# Patient Record
Sex: Male | Born: 1935
Health system: Southern US, Community
[De-identification: ages and names within clinical notes are randomized; demographics above are authoritative.]

## PROBLEM LIST (undated history)

## (undated) DIAGNOSIS — D649 Anemia, unspecified: Secondary | ICD-10-CM

## (undated) DIAGNOSIS — I2510 Atherosclerotic heart disease of native coronary artery without angina pectoris: Secondary | ICD-10-CM

## (undated) DIAGNOSIS — R05 Cough: Secondary | ICD-10-CM

## (undated) DIAGNOSIS — R590 Localized enlarged lymph nodes: Secondary | ICD-10-CM

## (undated) DIAGNOSIS — I219 Acute myocardial infarction, unspecified: Secondary | ICD-10-CM

## (undated) DIAGNOSIS — R0600 Dyspnea, unspecified: Secondary | ICD-10-CM

## (undated) DIAGNOSIS — I10 Essential (primary) hypertension: Secondary | ICD-10-CM

## (undated) DIAGNOSIS — I209 Angina pectoris, unspecified: Secondary | ICD-10-CM

## (undated) DIAGNOSIS — I499 Cardiac arrhythmia, unspecified: Secondary | ICD-10-CM

## (undated) DIAGNOSIS — F419 Anxiety disorder, unspecified: Secondary | ICD-10-CM

## (undated) DIAGNOSIS — Z87442 Personal history of urinary calculi: Secondary | ICD-10-CM

## (undated) DIAGNOSIS — D469 Myelodysplastic syndrome, unspecified: Secondary | ICD-10-CM

## (undated) DIAGNOSIS — R059 Cough, unspecified: Secondary | ICD-10-CM

## (undated) DIAGNOSIS — I4891 Unspecified atrial fibrillation: Secondary | ICD-10-CM

## (undated) DIAGNOSIS — I680 Cerebral amyloid angiopathy: Secondary | ICD-10-CM

## (undated) DIAGNOSIS — K219 Gastro-esophageal reflux disease without esophagitis: Secondary | ICD-10-CM

## (undated) DIAGNOSIS — N189 Chronic kidney disease, unspecified: Secondary | ICD-10-CM

## (undated) DIAGNOSIS — E854 Organ-limited amyloidosis: Secondary | ICD-10-CM

## (undated) DIAGNOSIS — I509 Heart failure, unspecified: Secondary | ICD-10-CM

## (undated) DIAGNOSIS — R062 Wheezing: Secondary | ICD-10-CM

## (undated) DIAGNOSIS — I429 Cardiomyopathy, unspecified: Secondary | ICD-10-CM

## (undated) HISTORY — DX: Anxiety disorder, unspecified: F41.9

## (undated) HISTORY — DX: Chronic kidney disease, unspecified: N18.9

## (undated) HISTORY — PX: CORONARY ANGIOPLASTY WITH STENT PLACEMENT: SHX49

## (undated) HISTORY — DX: Anemia, unspecified: D64.9

## (undated) HISTORY — PX: COLON SURGERY: SHX602

## (undated) HISTORY — PX: CHOLECYSTECTOMY: SHX55

## (undated) HISTORY — PX: CORONARY ARTERY BYPASS GRAFT: SHX141

---

## 2003-07-07 ENCOUNTER — Other Ambulatory Visit: Payer: Self-pay

## 2003-07-10 ENCOUNTER — Other Ambulatory Visit: Payer: Self-pay

## 2005-02-20 ENCOUNTER — Ambulatory Visit: Payer: Self-pay | Admitting: Gastroenterology

## 2005-12-07 ENCOUNTER — Ambulatory Visit: Payer: Self-pay | Admitting: Podiatry

## 2006-07-02 ENCOUNTER — Ambulatory Visit: Payer: Self-pay | Admitting: Internal Medicine

## 2007-04-30 ENCOUNTER — Ambulatory Visit: Payer: Self-pay | Admitting: Ophthalmology

## 2008-03-23 ENCOUNTER — Ambulatory Visit: Payer: Self-pay | Admitting: Gastroenterology

## 2009-08-01 ENCOUNTER — Ambulatory Visit: Payer: Self-pay | Admitting: Internal Medicine

## 2009-08-26 ENCOUNTER — Ambulatory Visit: Payer: Self-pay | Admitting: Gastroenterology

## 2010-09-10 ENCOUNTER — Inpatient Hospital Stay (HOSPITAL_COMMUNITY)
Admission: AD | Admit: 2010-09-10 | Discharge: 2010-09-12 | DRG: 694 | Disposition: A | Discharge status: Home / Self Care | Payer: Medicare Other | Source: Other Acute Inpatient Hospital | Attending: Emergency Medicine | Admitting: Emergency Medicine

## 2010-09-10 ENCOUNTER — Emergency Department: Payer: Self-pay | Admitting: Emergency Medicine

## 2010-09-10 ENCOUNTER — Inpatient Hospital Stay (HOSPITAL_COMMUNITY): Payer: Medicare Other

## 2010-09-10 DIAGNOSIS — Z7982 Long term (current) use of aspirin: Secondary | ICD-10-CM

## 2010-09-10 DIAGNOSIS — F17200 Nicotine dependence, unspecified, uncomplicated: Secondary | ICD-10-CM | POA: Diagnosis present

## 2010-09-10 DIAGNOSIS — R079 Chest pain, unspecified: Secondary | ICD-10-CM | POA: Diagnosis present

## 2010-09-10 DIAGNOSIS — I129 Hypertensive chronic kidney disease with stage 1 through stage 4 chronic kidney disease, or unspecified chronic kidney disease: Secondary | ICD-10-CM | POA: Diagnosis present

## 2010-09-10 DIAGNOSIS — R7989 Other specified abnormal findings of blood chemistry: Secondary | ICD-10-CM

## 2010-09-10 DIAGNOSIS — N183 Chronic kidney disease, stage 3 unspecified: Secondary | ICD-10-CM | POA: Diagnosis present

## 2010-09-10 DIAGNOSIS — I2510 Atherosclerotic heart disease of native coronary artery without angina pectoris: Secondary | ICD-10-CM | POA: Diagnosis present

## 2010-09-10 DIAGNOSIS — D126 Benign neoplasm of colon, unspecified: Secondary | ICD-10-CM | POA: Diagnosis present

## 2010-09-10 DIAGNOSIS — Z860100 Personal history of colon polyps, unspecified: Secondary | ICD-10-CM

## 2010-09-10 DIAGNOSIS — I252 Old myocardial infarction: Secondary | ICD-10-CM

## 2010-09-10 DIAGNOSIS — N201 Calculus of ureter: Principal | ICD-10-CM | POA: Diagnosis present

## 2010-09-10 DIAGNOSIS — E785 Hyperlipidemia, unspecified: Secondary | ICD-10-CM | POA: Diagnosis present

## 2010-09-10 DIAGNOSIS — K219 Gastro-esophageal reflux disease without esophagitis: Secondary | ICD-10-CM | POA: Diagnosis present

## 2010-09-10 DIAGNOSIS — N1330 Unspecified hydronephrosis: Secondary | ICD-10-CM | POA: Diagnosis present

## 2010-09-10 DIAGNOSIS — Z951 Presence of aortocoronary bypass graft: Secondary | ICD-10-CM

## 2010-09-10 DIAGNOSIS — Z9861 Coronary angioplasty status: Secondary | ICD-10-CM

## 2010-09-10 LAB — CBC
HCT: 36.2 % — ABNORMAL LOW (ref 39.0–52.0)
Hemoglobin: 12.1 g/dL — ABNORMAL LOW (ref 13.0–17.0)
MCHC: 33.4 g/dL (ref 30.0–36.0)
RBC: 3.97 MIL/uL — ABNORMAL LOW (ref 4.22–5.81)

## 2010-09-11 ENCOUNTER — Inpatient Hospital Stay (HOSPITAL_COMMUNITY): Payer: Medicare Other

## 2010-09-11 DIAGNOSIS — I2510 Atherosclerotic heart disease of native coronary artery without angina pectoris: Secondary | ICD-10-CM

## 2010-09-11 LAB — COMPREHENSIVE METABOLIC PANEL
ALT: 12 U/L (ref 0–53)
AST: 20 U/L (ref 0–37)
AST: 18 U/L (ref 0–37)
Albumin: 3.6 g/dL (ref 3.5–5.2)
Albumin: 3.3 g/dL — ABNORMAL LOW (ref 3.5–5.2)
BUN: 14 mg/dL (ref 6–23)
CO2: 24 mEq/L (ref 19–32)
Calcium: 8 mg/dL — ABNORMAL LOW (ref 8.4–10.5)
Calcium: 8.1 mg/dL — ABNORMAL LOW (ref 8.4–10.5)
Chloride: 102 mEq/L (ref 96–112)
Creatinine, Ser: 1.57 mg/dL — ABNORMAL HIGH (ref 0.4–1.5)
Creatinine, Ser: 1.55 mg/dL — ABNORMAL HIGH (ref 0.4–1.5)
GFR calc Af Amer: 52 mL/min — ABNORMAL LOW (ref >60)
GFR calc Af Amer: 53 mL/min — ABNORMAL LOW (ref >60)
Sodium: 136 mEq/L (ref 135–145)
Total Protein: 6.5 g/dL (ref 6.0–8.3)

## 2010-09-11 LAB — TSH: TSH: 2.044 u[IU]/mL (ref 0.350–4.500)

## 2010-09-11 LAB — CBC
HCT: 34.9 % — ABNORMAL LOW (ref 39.0–52.0)
Hemoglobin: 11.7 g/dL — ABNORMAL LOW (ref 13.0–17.0)
MCHC: 33.5 g/dL (ref 30.0–36.0)
MCV: 91.4 fL (ref 78.0–100.0)
RDW: 14 % (ref 11.5–15.5)
WBC: 10.9 10*3/uL — ABNORMAL HIGH (ref 4.0–10.5)

## 2010-09-11 LAB — CARDIAC PANEL(CRET KIN+CKTOT+MB+TROPI)
CK, MB: 2.2 ng/mL (ref 0.3–4.0)
Relative Index: 1.9 (ref 0.0–2.5)
Relative Index: 1.6 (ref 0.0–2.5)
Relative Index: 1.3 (ref 0.0–2.5)
Total CK: 113 U/L (ref 7–232)
Total CK: 116 U/L (ref 7–232)
Troponin I: 0.03 ng/mL (ref 0.00–0.06)
Troponin I: 0.02 ng/mL (ref 0.00–0.06)
Troponin I: 0.02 ng/mL (ref 0.00–0.06)

## 2010-09-11 LAB — URINALYSIS, ROUTINE W REFLEX MICROSCOPIC
Bilirubin Urine: NEGATIVE
Ketones, ur: NEGATIVE mg/dL
Leukocytes, UA: NEGATIVE
Nitrite: NEGATIVE
Protein, ur: 30 mg/dL — AB

## 2010-09-11 LAB — DIFFERENTIAL
Basophils Absolute: 0 10*3/uL (ref 0.0–0.1)
Eosinophils Relative: 1 % (ref 0–5)
Lymphocytes Relative: 20 % (ref 12–46)
Lymphs Abs: 2.2 10*3/uL (ref 0.7–4.0)
Monocytes Absolute: 2.1 10*3/uL — ABNORMAL HIGH (ref 0.1–1.0)
Neutro Abs: 6.6 10*3/uL (ref 1.7–7.7)

## 2010-09-11 LAB — APTT: aPTT: 33 seconds (ref 24–37)

## 2010-09-11 LAB — LIPID PANEL
HDL: 15 mg/dL — ABNORMAL LOW (ref >39)
Total CHOL/HDL Ratio: 5.6 RATIO
Triglycerides: 78 mg/dL (ref <150)
VLDL: 16 mg/dL (ref 0–40)

## 2010-09-11 LAB — PHOSPHORUS: Phosphorus: 3.3 mg/dL (ref 2.3–4.6)

## 2010-09-11 LAB — URINE MICROSCOPIC-ADD ON

## 2010-09-11 LAB — PROTIME-INR
INR: 1.05 (ref 0.00–1.49)
Prothrombin Time: 13.9 seconds (ref 11.6–15.2)

## 2010-09-11 LAB — D-DIMER, QUANTITATIVE: D-Dimer, Quant: 1.46 ug/mL-FEU — ABNORMAL HIGH (ref 0.00–0.48)

## 2010-09-11 MED ORDER — TECHNETIUM TO 99M ALBUMIN AGGREGATED
6.0000 | Freq: Once | INTRAVENOUS | Status: AC | PRN
  Administered 2010-09-11: 6 via INTRAVENOUS

## 2010-09-11 MED ORDER — XENON XE 133 GAS
10.0000 | GAS_FOR_INHALATION | Freq: Once | RESPIRATORY_TRACT | Status: AC | PRN
  Administered 2010-09-11: 10 via RESPIRATORY_TRACT

## 2010-09-12 LAB — COMPREHENSIVE METABOLIC PANEL
Albumin: 3.3 g/dL — ABNORMAL LOW (ref 3.5–5.2)
Alkaline Phosphatase: 67 U/L (ref 39–117)
BUN: 15 mg/dL (ref 6–23)
CO2: 25 mEq/L (ref 19–32)
Chloride: 105 mEq/L (ref 96–112)
Creatinine, Ser: 1.31 mg/dL (ref 0.4–1.5)
GFR calc non Af Amer: 53 mL/min — ABNORMAL LOW (ref >60)
Glucose, Bld: 103 mg/dL — ABNORMAL HIGH (ref 70–99)
Potassium: 3.5 mEq/L (ref 3.5–5.1)
Total Bilirubin: 1.1 mg/dL (ref 0.3–1.2)

## 2010-09-12 LAB — CBC
HCT: 36.3 % — ABNORMAL LOW (ref 39.0–52.0)
Hemoglobin: 11.8 g/dL — ABNORMAL LOW (ref 13.0–17.0)
WBC: 6.8 10*3/uL (ref 4.0–10.5)

## 2010-09-12 LAB — DIFFERENTIAL
Basophils Absolute: 0.1 K/uL (ref 0.0–0.1)
Basophils Relative: 1 % (ref 0–1)
Eosinophils Absolute: 0.2 K/uL (ref 0.0–0.7)
Eosinophils Relative: 2 % (ref 0–5)
Lymphocytes Relative: 30 % (ref 12–46)
Lymphs Abs: 2.1 K/uL (ref 0.7–4.0)
Monocytes Absolute: 1.6 K/uL — ABNORMAL HIGH (ref 0.1–1.0)
Monocytes Relative: 24 % — ABNORMAL HIGH (ref 3–12)
Neutro Abs: 2.9 K/uL (ref 1.7–7.7)
Neutrophils Relative %: 43 % (ref 43–77)

## 2010-09-13 ENCOUNTER — Observation Stay: Payer: Self-pay | Admitting: Cardiology

## 2010-09-13 NOTE — H&P (Signed)
Andres Gutierrez, Andres NO.:  [id number]  MEDICAL RECORD NO.:  [id number]           PATIENT TYPE:  I  LOCATION:  2021                         FACILITY:  [facility]  PHYSICIAN:  Irwin Brakeman, MD   DATE OF BIRTH:  [birth year]  DATE OF ADMISSION:  09/10/2010 DATE OF DISCHARGE:                             HISTORY & PHYSICAL   PRIMARY CARE PHYSICIAN:  Dr. Emily Filbert.  CARDIOLOGIST:  Dr. Ubaldo Glassing.  CHIEF COMPLAINT:  Abdominal pain.  HISTORY OF PRESENT ILLNESS:  This patient is a 77 year old gentleman with coronary artery disease and dyslipidemia who presented to [facility] Emergency Department earlier today complaining of abdominal pain for approximately 12 hours.  The patient reports that the abdominal pain started at around midnight and persisted for approximately 6 hours and then dissipated and then a couple of hours after that the pain started again, it woke him from sleep.  The patient describes the pain in the right upper quadrant area with pain in the epigastric area and center of the chest.  The patient reports that he went to the [facility] Emergency Department.  At the ER, he had a CT scan that revealed an obstructing distal right ureteral stone with periureteric and perinephric stranding.  There was mild right hydronephrosis.  Bilateral renal cysts were also present.  In addition to that, the patient's cardiac enzymes were mildly elevated.  His troponins were elevated.  The patient has a history of coronary artery disease and bypass.  He also has cardiac stents.  He was also told that 2 years ago when he had a catheterization that there was some coronary artery disease that was present that was not amenable to intervention. The patient denies shortness of breath.  The patient was transferred to [facility] so that he can receive Urology Care  and Cardiology consultation.  Hospital admission was requested.  PAST MEDICAL HISTORY: 1. The  patient reports that he had a myocardial infarction in 1984,     and had triple vessel bypass in December 1984.  The patient reports     that he has one cardiac stent. 2. Hypertension. 3. Hyperlipidemia. 4. History of multiple colon polyps and family history of colon     cancer.  PAST SURGICAL HISTORY:  The patient reports a three-vessel CABG in 1984 and cardiac stents placed and appendectomy.  MEDICATIONS: 1. Aspirin 81 mg one p.o. daily. 2. Fish oil 1000 mg one p.o. daily. 3. Lipitor 40 mg p.o. daily. 4. Metoprolol succinate 100 mg extended release one p.o. b.i.d. 5. Micardis 80 mg one p.o. daily. 6. Omeprazole 40 mg one p.o. daily.  ALLERGIES:  Benadryl, Maxzide, Vytorin, and Zocor.  FAMILY HISTORY:  The patient reports that his father died from colon cancer in 18.  He reports that his mother suffered from a myocardial infarction and also had breast cancer.  SOCIAL HISTORY:  This patient is married and lives at [facility]. He reports that he smoked cigarettes for approximately 30 years and then he started chewing tobacco.  He denies alcohol and recreational drug use.  REVIEW OF SYSTEMS:  The patient reports symptoms positive for nausea and some emesis this morning, no shortness of breath, positive for abdominal pain as mentioned in the HPI.  Please see HPI, otherwise all systems reviewed completely and reported as negative.  PHYSICAL EXAMINATION:  CURRENT VITAL SIGNS: Temperature afebrile, pulse 51, respirations 18, blood pressure 133/64, pulse ox 96% on room air. GENERAL:  This is an elderly male.  He is awake, alert in no apparent distress, cooperative and pleasant.  HEENT: Normocephalic, atraumatic. Mucous membranes dry.  Oropharynx pink. NECK:  Supple.  Thyroid soft.  No nodules or masses palpated.  No JVD, trachea midline. LUNGS:  Bilateral breath sounds clear to auscultation. CARDIAC:  Normal S1 and S2 sounds without murmurs, rubs or gallops. ABDOMEN:  Soft  with right upper quadrant tenderness.  Negative Murphy's sign.  No guarding or rebound tenderness noted.  No periumbilical tenderness.  Mild epigastric tenderness with deep palpation.  No masses palpated. EXTREMITIES:  No pretibial edema, cyanosis or clubbing.  Pedal pulses 2+ bilateral neurological exam.  No focal deficits.  Moving all extremities. PSYCHIATRIC:  Awake, alert and oriented x3. SKIN:  No skin breakdown or ulcers noted.  No other lesions noted.  LABORATORY DATA:  These labs are taken from [facility] records glucose 116, BUN 14, creatinine 1.24.  Sodium 137, potassium 3.7, chloride 103, bicarb 26, calcium 8.3, bilirubin 0.8, ALT 19, AST 21, total protein 7.8, albumin 3.9, troponin 0.12.  Urinalysis reveals 3+ blood, 50 red blood cells per high-power field, 2 white blood cells per high-power field.  Abdominal ultrasound reveals a thickened gallbladder wall but no evidence of cholelithiasis and negative Murphy sign and no ascites seen.  The CT of the abdomen and pelvis reveals the obstructing distal right ureteral stone with periureteric and perinephric stranding and mild right hydronephrosis.  IMPRESSION: 1. This is a 77 year old gentleman with coronary artery disease     presenting with an acute obstructing distal right ureteral stone     with periureteric and perinephric stranding and mild right     hydronephrosis. 2. Elevated troponin concerning for in-STEMI. 3. Hypertension. 4. Hyperlipidemia. 5. Colon polyps. 6. Chronic active nicotine dependence.  RECOMMENDATIONS: 1. The patient has been admitted to the telemetry unit for close     monitoring.  We will start anticoagulating the patient with a     heparin drip. 2. IV fluids have been ordered. 3. Morphine, oxygen, nitroglycerin ordered via the ACS protocol. 4. Resume home medications for blood pressure lipids and cholesterol. 5. We will ask a urologist to evaluate the patient as soon as  possible     and will also ask Cardiology to evaluate the patient in the     hospital as well. 6. Aspirin 325 mg p.o. 7. Fasting lipid profile ordered for a.m. 8. Cycle cardiac enzymes. 9. Check a BNP, TSH and D-dimer. 10.Continue to monitor the patient closely and make adjustments to     medical care as required.     Irwin Brakeman, MD     CJ/MEDQ  D:  09/10/2010  T:  09/10/2010  Job:  [id number]  Electronically Signed by Irwin Brakeman  on 09/13/2010 05:55:53 PM

## 2010-09-14 NOTE — Discharge Summary (Signed)
Andres Gutierrez, Andres Gutierrez                  ACCOUNT NO.:  [id number]  MEDICAL RECORD NO.:  [id number]           PATIENT TYPE:  I  LOCATION:  2021                         FACILITY:  [facility]  PHYSICIAN:  Toluwani Yadav, DO         DATE OF BIRTH:  01/12/1931  DATE OF ADMISSION:  09/10/2010 DATE OF DISCHARGE:  09/12/2010                              DISCHARGE SUMMARY   ADMISSION DIAGNOSES: 1. Ureterolithiasis. 2. Mild hydronephrosis. 3. Elevated troponin. 4. Hypertension. 5. Hyperlipidemia. 6. Colon polyps. 7. Chronic active nicotine dependence.  HISTORY OF PRESENT ILLNESS:  Please see H and [place] COURSE:  The patient was admitted to telemetry unit.  He was closely monitored.  The patient was anticoagulated with a heparin drip. He was given IV fluids, morphine, oxygen, nitroglycerin as per the ACS protocol.  The patient resumed his home medications for blood pressure, lipids, and cholesterol.  Urology was consulted for the patient's ureterolithiasis.  It was felt by the time the urologist had seen him that the 2-mm stone may have passed.  In any case, the patient by that time was asymptomatic and it was believed that the pain would continue to resolve more, but that he could have some recurrent discomfort.  He should have an 80% chance of spontaneous passage of the stone, so conservative therapy was recommended and that he should follow up with a local urologist in [place] should conservative therapy not help.  The patient was also seen by Cardiology who evaluated the patient and felt that his chest pain was unlikely to be due to ischemia.  He felt that he would benefit from noninvasive risk stratification after his stone is treated and an outpatient his recommendation is that he follow up with Dr. Ubaldo Glassing, his cardiologist, in [place] upon discharge.  The patient today is feeling well.  He said no chest pain, no pain from his kidney stone, and he wants to go home.  An echocardiogram  was ordered and has not been read yet.  The patient does not wish to cypher that.  The cardiologist felt that the patient could simply follow up with Dr. Ubaldo Glassing in [place] and that is what we will do.  We will discharge the patient to home.  DISCHARGE INSTRUCTIONS:  Include activity as tolerated.  Diet is cardiac.  MEDICATIONS AT HOME: 1. Tamsulosin 0.4 mg 1 capsule p.o. daily. 2. Acetaminophen 325 two p.o. q.4 hours p.r.n. pain. 3. Aspirin 81 mg 1 p.o. daily. 4. Omega-3 acid ethyl esters 1 g p.o. daily. 5. Lipitor 40 mg 1 p.o. daily. 6. Metoprolol XL 100 mg 1 p.o. b.i.d. 7. Micardis 80 mg 1 p.o. daily. 8. Omeprazole 40 mg 1 p.o. daily.  Besides the cardiac diet, the patient is to drink plenty of water.  I have spent 40 minutes on this discharge.          ______________________________ Karie Kirks, DO     AS/MEDQ  D:  09/12/2010  T:  09/13/2010  Job:  [id number]  cc:   Dr. Norm Salt, MD  Electronically Signed by Karie Kirks DO  on 09/14/2010 06:07:39 PM

## 2010-09-20 NOTE — Consult Note (Signed)
Andres Gutierrez, Andres Gutierrez NO.:  [id number]  MEDICAL RECORD NO.:  [id number]           PATIENT TYPE:  I  LOCATION:  2021                         FACILITY:  [facility]  PHYSICIAN:  Bernestine Amass, M.D.  DATE OF BIRTH:  13-Feb-1931  DATE OF CONSULTATION: DATE OF DISCHARGE:                                CONSULTATION   REASON FOR CONSULTATION:  A 2-mm right distal ureteral stone.  HISTORY OF PRESENT ILLNESS:  Andres Gutierrez is 77 years of age.  He reports one prior history of nephrolithiasis without any previous urologic intervention.  The patient was transferred from [facility] Emergency Room last evening apparently because of some questionable cardiac issues and also abdominal pain.  CT scan that shown a very small 2-mm right distal ureteral stone and apparently her transfer was accepted because of lack of Urology consultation available at [facility].  A stone this size was not complicated by any problems with fever, chills, urosepsis, etc.  The patient was admitted and underwent some cardiac assessment.  He is now pain free.  He believes he may have passed the stone, but the stone was not actually obtained.  He has had no pain or vomiting.  No voiding issues and feels good at this time.  PAST MEDICAL HISTORY:  Notable for coronary artery disease, status post bypass grafting along with hypertension and hyperlipidemia.  MEDICATIONS:  The patient's admission medications included aspirin, Lipitor, metoprolol, omeprazole, Micardis and aspirin.  ALLERGIES:  The patient has allergies to [place], VYTORIN, AND ZOCOR.  SOCIAL HISTORY:  The patient has a previous 50-60 pack-year smoking history.  FAMILY HISTORY:  Noncontributory.  PHYSICAL EXAMINATION:  VITAL SIGNS:  He is hypertensive with a blood pressure 160/84.  Pulse is 62. GENERAL:  The patient is a well-developed, well-nourished male in no acute distress. NECK:  Exam shows no obvious  JVD. RESPIRATORY:  Shows normal respiratory effort. HEART:  Regular rate and rhythm. ABDOMEN:  Soft and nontender.  No CVA tenderness.  No palpable masses. EXTREMITIES:  Normal.  DATA:  CT by report shows a 2-mm distal right ureteral stone.  I was unable to actually view the images on the DVT on the [facility] computer.  ASSESSMENT:  A 2-mm right distal ureteral stone.  The stone may indeed have already passed and the patient is asymptomatic.  I did tell Andres Gutierrez that occasionally the pain will resolve, but the stone may be still present and it is possible that he will have some recurrent discomfort.  The stone of this size should have an 80% chance of spontaneous passage and generally we recommend conservative therapy unless the patient has unremitting pain or complicating features such as solitary kidney, urosepsis, etc..  I have suggested that he follow up with local urologist in his community, but that if he is unable to do so or prefer to follow up in [place] would be happy to see him.     Bernestine Amass, M.D.     DSG/MEDQ  D:  09/11/2010  T:  09/12/2010  Job:  [id number]  Electronically Signed by Mikaila Grunert  Risa Grill M.D. on 09/20/2010 08:45:04 AM

## 2010-10-20 NOTE — Consult Note (Signed)
NAMERIAZ, SHOSTAK NO.:  [id number]  MEDICAL RECORD NO.:  [id number]           PATIENT TYPE:  I  LOCATION:  2021                         FACILITY:  [facility]  PHYSICIAN:  Johnney Ou, MD DATE OF BIRTH:  August 11, 1930  DATE OF CONSULTATION:  09/10/2010 DATE OF DISCHARGE:                                CONSULTATION   CARDIOLOGIST:  Dr. Ubaldo Glassing.  PRIMARY DOCTOR:  Dr. Sabra Heck.  REASON FOR CONSULTATION:  Elevated cardiac biomarkers.  CHIEF COMPLAINT:  Abdominal pain.  HISTORY OF PRESENT ILLNESS:  This is a 77 year old white male with history of coronary disease status post coronary bypass graft and PCI who presents from [facility] after having reported abdominal pain and CT scan showing right hydronephrosis from a distal obstructing ureteral stone.  He reports mild epigastric pain that has lasted for over 1 day that begins in his epigastrium and radiates to his left groin.  He also reports new onset of right lower quadrant focal pain that is about 4/10.  He has had this pain over the past few months off and on but never lasting this long.  He is very active and walks approximately 3 miles a few times a week and does not report any recent exertional angina, rest pain, increased lower extremity edema, paroxysmal nocturnal dyspnea, orthopnea, palpitations, syncope, or presyncope.  His presenting symptoms are reproducible and wax and wane though over the past day or so have not completely resolved.  PAST MEDICAL HISTORY: 1. Coronary disease status post coronary bypass graft reportedly two-     vessel bypass in 1984 with a PCI to a graft in 1997 and overall     unknown anatomy. 2. Hypertension. 3. Hyperlipidemia. 4. GERD.  ALLERGIES:  BENADRYL, MAXZIDE, VYTORIN, and ZOCOR.  MEDICATIONS ON ADMISSION: 1. Aspirin 81 mg daily. 2. Fish oil 1000 mg daily. 3. Lipitor 40 mg daily. 4. Metoprolol XL 100 mg twice daily. 5. Micardis 80 mg  daily. 6. Omeprazole 40 mg daily.  SOCIAL HISTORY:  He lives in [place] with his wife.  He is currently retired.  He has a approximately 30 pack-year history and quit smoking in 1979.  FAMILY HISTORY:  His mother had a myocardial infarction at 62 years old.  REVIEW OF SYSTEMS:  All 14 systems were reviewed and were negative except as mentioned in detail in HPI.  PHYSICAL EXAMINATION:  VITAL SIGNS:  His blood pressure is 130/71, respiratory rate is 18, pulses 54, sating 94% on room air. GENERAL:  He is a 77 year old white male, appearing stated age, no acute distress. HEENT:  Moist mucous membranes.  Pupils equal, round, and reactive to light and accommodation.  Anicteric sclera. NECK:  No jugular venous distention.  No thyromegaly CARDIOVASCULAR:  Regular rate and rhythm.  No murmurs, rubs, or gallops. LUNGS:  Clear to auscultation bilaterally. ABDOMEN:  Mild epigastric tenderness to palpation with mild rebound. EXTREMITIES:  No clubbing, cyanosis, or edema. NEUROLOGIC:  Alert and oriented x3.  Cranial nerves II through XII grossly intact.  No focal neurologic deficit. SKIN:  Warm, dry, and intact.  No rashes.  PSYCHIATRIC:  Mood and affect are appropriate.  RADIOLOGY:  A CT showed distal right ureteral stone with perinephric stranding and mild bilateral hydronephrosis EKG showed normal sinus rhythm with a rate of 66 beats per minute with anterior Q-waves and PVCs.  LABORATORY REVIEW:  White cell count is 13.5, hematocrit is 36.2. Potassium is 3.7, creatinine is 1.24.  Troponin at the outside hospital is 0.12.  ASSESSMENT:  This is a 77 year old white male with a history of coronary disease status post coronary bypass graft here with obstructing ureteral stone and a mildly elevated troponin. 1. Elevated troponin.  This unlikely represents acute coronary     syndrome and at worst represents type 2 myocardial infarction.  I     agree with empiric heparin for now unless his  hematuria worsens.     Repeat troponin here and cycle cardiac enzymes.  He does not have     any chest pain and his EKG is nonischemic which suggests low-risk     presentation from a cardiac standpoint.  He will likely benefit     from noninvasive risk ratification after his stone is treated. 2. Hyperlipidemia.  We will continue statin therapy and check a     fasting lipid profile. 3. Hypertension.  His blood pressure is currently at goal.  We will     continue his current medical regimen.     Johnney Ou, MD     BHH/MEDQ  D:  09/11/2010  T:  09/11/2010  Job:  [id number]  Electronically Signed by Matthew Saras MD on 10/20/2010 08:52:34 PM

## 2010-10-31 ENCOUNTER — Ambulatory Visit: Payer: Self-pay | Admitting: Gastroenterology

## 2010-11-02 LAB — PATHOLOGY REPORT

## 2011-02-13 ENCOUNTER — Ambulatory Visit: Payer: Self-pay | Admitting: Internal Medicine

## 2011-04-21 ENCOUNTER — Observation Stay: Payer: Self-pay | Admitting: Internal Medicine

## 2011-04-21 ENCOUNTER — Ambulatory Visit: Payer: Self-pay

## 2011-05-18 ENCOUNTER — Ambulatory Visit: Payer: Self-pay | Admitting: Gastroenterology

## 2011-05-22 LAB — PATHOLOGY REPORT

## 2012-06-30 ENCOUNTER — Ambulatory Visit: Payer: Self-pay | Admitting: Podiatry

## 2012-10-15 ENCOUNTER — Ambulatory Visit: Payer: Self-pay | Admitting: Podiatry

## 2012-10-20 ENCOUNTER — Inpatient Hospital Stay: Payer: Self-pay | Admitting: Podiatry

## 2012-10-20 LAB — CBC WITH DIFFERENTIAL/PLATELET
Basophil #: 0.1 10*3/uL (ref 0.0–0.1)
Basophil %: 1 %
Eosinophil #: 0 10*3/uL (ref 0.0–0.7)
Eosinophil %: 0.2 %
HCT: 30.8 % — ABNORMAL LOW (ref 40.0–52.0)
HGB: 10.5 g/dL — ABNORMAL LOW (ref 13.0–18.0)
Lymphocyte #: 1.5 10*3/uL (ref 1.0–3.6)
Lymphocyte %: 11.9 %
MCH: 31.1 pg (ref 26.0–34.0)
MCHC: 34.3 g/dL (ref 32.0–36.0)
MCV: 91 fL (ref 80–100)
Monocyte #: 2.3 x10 3/mm — ABNORMAL HIGH (ref 0.2–1.0)
Monocyte %: 18 %
Neutrophil #: 8.9 10*3/uL — ABNORMAL HIGH (ref 1.4–6.5)
Neutrophil %: 68.9 %
Platelet: 220 10*3/uL (ref 150–440)
RBC: 3.39 10*6/uL — ABNORMAL LOW (ref 4.40–5.90)
RDW: 14.3 % (ref 11.5–14.5)
WBC: 12.9 10*3/uL — ABNORMAL HIGH (ref 3.8–10.6)

## 2012-10-20 LAB — BASIC METABOLIC PANEL
Anion Gap: 8 (ref 7–16)
BUN: 15 mg/dL (ref 7–18)
Calcium, Total: 8.4 mg/dL — ABNORMAL LOW (ref 8.5–10.1)
Chloride: 106 mmol/L (ref 98–107)
Co2: 26 mmol/L (ref 21–32)
Creatinine: 1.29 mg/dL (ref 0.60–1.30)
EGFR (African American): >60
EGFR (Non-African Amer.): 52 — ABNORMAL LOW
Glucose: 98 mg/dL (ref 65–99)
Osmolality: 280 (ref 275–301)
Potassium: 3.2 mmol/L — ABNORMAL LOW (ref 3.5–5.1)
Sodium: 140 mmol/L (ref 136–145)

## 2012-10-20 LAB — CREATININE, SERUM
Creatinine: 1.25 mg/dL (ref 0.60–1.30)
EGFR (African American): >60
EGFR (Non-African Amer.): 54 — ABNORMAL LOW

## 2012-10-21 LAB — CBC WITH DIFFERENTIAL/PLATELET
Basophil #: 0.1 10*3/uL (ref 0.0–0.1)
Basophil %: 0.6 %
Eosinophil #: 0 10*3/uL (ref 0.0–0.7)
Eosinophil %: 0.2 %
HCT: 29.1 % — ABNORMAL LOW (ref 40.0–52.0)
HGB: 10 g/dL — ABNORMAL LOW (ref 13.0–18.0)
Lymphocyte #: 1.5 10*3/uL (ref 1.0–3.6)
Lymphocyte %: 16.4 %
MCH: 31.4 pg (ref 26.0–34.0)
MCHC: 34.5 g/dL (ref 32.0–36.0)
MCV: 91 fL (ref 80–100)
Monocyte #: 2.1 x10 3/mm — ABNORMAL HIGH (ref 0.2–1.0)
Monocyte %: 23.2 %
Neutrophil #: 5.3 10*3/uL (ref 1.4–6.5)
Neutrophil %: 59.6 %
Platelet: 209 10*3/uL (ref 150–440)
RBC: 3.2 10*6/uL — ABNORMAL LOW (ref 4.40–5.90)
RDW: 14.1 % (ref 11.5–14.5)
WBC: 8.9 10*3/uL (ref 3.8–10.6)

## 2012-10-22 LAB — BASIC METABOLIC PANEL
Anion Gap: 8 (ref 7–16)
Co2: 26 mmol/L (ref 21–32)
EGFR (African American): >60
EGFR (Non-African Amer.): >60
Osmolality: 282 (ref 275–301)
Sodium: 141 mmol/L (ref 136–145)

## 2012-10-23 LAB — BASIC METABOLIC PANEL
Anion Gap: 5 — ABNORMAL LOW (ref 7–16)
BUN: 13 mg/dL (ref 7–18)
Calcium, Total: 8.6 mg/dL (ref 8.5–10.1)
Creatinine: 1.29 mg/dL (ref 0.60–1.30)
EGFR (African American): >60
EGFR (Non-African Amer.): 52 — ABNORMAL LOW
Glucose: 95 mg/dL (ref 65–99)

## 2012-10-23 LAB — CBC WITH DIFFERENTIAL/PLATELET
Basophil #: 0.1 10*3/uL (ref 0.0–0.1)
Eosinophil #: 0.1 10*3/uL (ref 0.0–0.7)
Eosinophil %: 1.9 %
HCT: 29.9 % — ABNORMAL LOW (ref 40.0–52.0)
MCH: 31.4 pg (ref 26.0–34.0)
Monocyte #: 1.8 x10 3/mm — ABNORMAL HIGH (ref 0.2–1.0)

## 2012-10-28 LAB — WOUND CULTURE

## 2013-11-17 ENCOUNTER — Ambulatory Visit: Payer: Self-pay | Admitting: Gastroenterology

## 2013-11-19 LAB — PATHOLOGY REPORT

## 2014-07-27 DIAGNOSIS — I255 Ischemic cardiomyopathy: Secondary | ICD-10-CM | POA: Insufficient documentation

## 2014-09-03 NOTE — Consult Note (Signed)
PATIENT NAME:  Andres Gutierrez, Andres Gutierrez MR#:  [id number] DATE OF BIRTH:  [birth year]  DATE OF CONSULTATION:  10/20/2012  REFERRING PHYSICIAN:  Samara Deist, MD  CONSULTING PHYSICIAN:  Alon Mazor P. Benjie Karvonen, MD PRIMARY CARE PHYSICIAN: Dr. Sabra Heck   REASON FOR CONSULTATION: Medical management.   IMPRESSION: 1.  Status post removal of sesamoid bone on the right foot with nonhealing ulcer, right great toe joint.  2.  History of hyperlipidemia.  3.  History of hypertension.  4.  History of coronary artery disease.  5.  Smokeless tobacco abuse.  PLAN: 1.  Patient, per Dr. Vickki Muff, planned for I and D of the right foot tonight.  2.  Agree with Zosyn and cultures.  3.  Continue outpatient medications.  4.  We will order a hemoglobin A1c to evaluate for diabetes.  5.  The patient was counseled for 4 minutes regarding the use of tobacco products. The patient is not interested in quitting.   HISTORY OF PRESENT ILLNESS:  This is a very pleasant 81 year old male with a history of CAD,  hypertension, hyperlipidemia who underwent excisional tibial sesamoid of the right first metatarsophalangeal joint on 10/15/2012 for a plantar right first metatarsophalangeal joint ulcer.  Since that time, the patient has had increasing foul-smelling odor and discharge from the foot. He had a follow-up today with Dr. Vickki Muff, and due to the foul-smelling odor and nonhealing ulcer, the plan was for admission to the hospital and an I and D. Hospitalist was consulted basically for medical management.   REVIEW OF SYSTEMS:   CONSTITUTIONAL: No fever, fatigue, weakness, weight loss, weight gain.  EYES: No blurred or double vision, glaucoma.  ENT: No ear pain, hearing loss, seasonal allergies, postnasal drip.  RESPIRATORY:  No cough, wheezing, hemoptysis, COPD.  CARDIOVASCULAR:  No chest pain, orthopnea, edema, arrhythmia, dyspnea on exertion, palpitations or syncope.   GASTROINTESTINAL:  No nausea, vomiting, diarrhea, abdominal pain, melena or  ulcers.  GENITOURINARY:  No dysuria or hematuria.   ENDOCRINE: No polyuria or polydipsia. HEMATOLOGIC/LYMPHATIC: No anemia or bleeding.  SKIN: He has got an ulcer at the base of his right foot. No other rashes or lesions noted.  MUSCULOSKELETAL: No gout, swelling, pain in the shoulders.  NEUROLOGIC: No history of CVA, TIA, or seizures.  PSYCHIATRIC: No history of anxiety, depression.   PAST MEDICAL HISTORY: 1.  CAD, status post 3-vessel CABG. 2.  History of hypertension.  3.  History of Hyperlipidemia. 4.  GERD.   MEDICATIONS: 1.  Aspirin 81 mg daily.  2.  Lipitor 40 mg daily.  3.  Metoprolol 100 b.i.d.  4.  Norvasc 5 mg daily.  5.  Sucralfate 1 gram twice a day.  6.  Fish oil once a day 1000 mg.  7.  Pantoprazole 40 mg b.i.d.   ALLERGIES: BENADRYL, MAXZIDE, VYTORIN AND ZOCOR.  SOCIAL HISTORY: The patient is a former smoker, but he does chew tobacco. No IV drug use or alcohol.   FAMILY HISTORY: Positive for CAD, hypertension   SURGICAL HISTORY:  CABG 3 vessel  PHYSICAL EXAMINATION:  VITAL SIGNS: The patient is afebrile with a temperature of 98.5, pulse is 82, respirations 18, blood pressure 145/64, 96% on room air.  GENERAL: The patient is alert, oriented, not in acute distress.  HEENT: Head is atraumatic. Pupils are round and reactive. Sclerae anicteric. Mucous membranes are moist. Oropharynx is clear.   NECK: Supple without JVD, carotid bruit, enlarged thyroid. CARDIOVASCULAR: Regular rate and rhythm. No murmur, gallops or rubs. PMI is not  displaced.  LUNGS: Clear to auscultation bilaterally without crackles, rales, rhonchi  or wheezing.  Normal to percussion.  ABDOMEN: Obese. Bowel sounds are positive. Nontender, nondistended. No hepatosplenomegaly.  EXTREMITIES:  No cyanosis, clubbing or edema.   NEUROLOGICAL: Cranial nerves II through XII are intact. There are no focal deficits.  SKIN: The foot was just dressed by Nursing; however, she notes that it is about 2 cm x 1  cm which does not probe all the way to the bone located at the right plantar side of the foot under the right metatarsal phalangeal  joint with grayish and yellowish-pink discharge.    LABS none EKG not on file  Thank you for allowing [place] to participate in the care of the patient. The primary care physician is Dr. Sabra Heck, who will be following the patient while in the hospital.    TIME SPENT ON CONSULTATION:  Approximately 55 minutes.   ____________________________ Donell Beers. Benjie Karvonen, MD spm:cb D: 10/20/2012 13:36:00 ET T: 10/20/2012 14:09:47 ET JOB#: [id number]  cc: Michalla Ringer P. Benjie Karvonen, MD, <Dictator> Donell Beers Rynell Ciotti MD ELECTRONICALLY SIGNED 10/20/2012 15:02

## 2014-09-03 NOTE — Op Note (Signed)
PATIENT NAME:  Andres Gutierrez, Andres Gutierrez MR#:  [id number] DATE OF BIRTH:  April 19, 1931  DATE OF PROCEDURE:  10/22/2012  PREOPERATIVE DIAGNOSIS: Right foot abscess.   POSTOPERATIVE DIAGNOSIS:  Right foot abscess.   PROCEDURE: I and D plantar right foot abscess.   SURGEON: Rudie Rikard A. Vickki Muff, DPM.   ANESTHESIA: IV sedation with local.   HEMOSTASIS: Epinephrine 1: 100,000 infiltrated along incision site.   COMPLICATIONS: None.   SPECIMEN: None.   OPERATIVE INDICATIONS: This is a 81 year old gentleman who developed a postoperative infection in his right foot. He underwent I and D two days ago. He had a little bit of residual purulent drainage today and therefore, I brought back up to the operating room for re- I and D. All risks, benefits, alternatives and complications associated with surgery were discussed with the patient in full and consent has been given.   OPERATIVE PROCEDURE: The patient was brought into the OR and placed on the operating table in the supine position. IV sedation was administered by the anesthesia team. A local block was infiltrated along incision site with epinephrine and 1% lidocaine. After sterile prep and draped, the plantar open wound that measures approximately  4 x 5 cm with a depth full thickness down to bone of approximately 3 cm was lengthened proximal and distally. There was a scant amount of purulence from the long flexor tendon sheath. There was a little bit of mild fibrotic possibly pre-necrotic tissue on the medial aspect of the first metatarsal head along the capsular region. This was excisionally debrided with a VersaJet on the deeper level, down to bone. All fibrotic, necrotic, infected tissue was removed. Next, the flexor hallucis longus tendon sheath was then opened and taken back proximal into the arch area. Further probing did not reveal any severe purulent drainage. I did flush the entire wound with a pulsed  lavage using 3 liters of saline. After final flushing, no  further purulent drainage was noted at this time. The proximal distal portion of the incision was closed with 3-0 nylon. This left the central wound open. At this time, the patient will be placed back on his previous floor. He was packed. This was packed with sterile saline gauze and a bulky dressing. He will be nonweightbearing again and we will re-evaluate for possible wound VAC therapy, hopefully instituting that tomorrow if possible.   ____________________________ Pete Glatter. Vickki Muff, DPM jaf:cc D: 10/22/2012 16:08:10 ET T: 10/22/2012 22:03:47 ET JOB#: [id number]  cc: Larkin Ina A. Vickki Muff, DPM, <Dictator> [place] DPM ELECTRONICALLY SIGNED 10/29/2012 11:54

## 2014-09-03 NOTE — Op Note (Signed)
PATIENT NAME:  Andres Gutierrez, Andres Gutierrez MR#:  [id number] DATE OF BIRTH:  [birth year]  DATE OF PROCEDURE:  10/20/2012  PREOPERATIVE DIAGNOSIS: Right plantar foot abscess with postoperative infection.   POSTOPERATIVE DIAGNOSIS: Right plantar foot abscess with postoperative infection.   PROCEDURE: Incision and drainage deep abscess, right plantar first metatarsophalangeal joint.   SURGEON: Keyonta Barradas A. Vickki Muff, DPM.   ANESTHESIA: IV sedation with local.   HEMOSTASIS: Epinephrine 1:200,000 infiltrated along the incision site plantarly.   COMPLICATIONS: None.   SPECIMEN: Wound culture of right foot.   ESTIMATED BLOOD LOSS: 10 mL.  OPERATIVE INDICATIONS: This is a 81 year old gentleman, who had undergone an operative excision of a tibial sesamoid from his right great toe joint 5 days ago. He presented to the outpatient clinic today with noted postoperative infection in the wound. I admitted him to the hospital for IV antibiotics and debridement of this wound urgently. All risks, benefits, alternatives, and complications associated with surgery were discussed with the patient and full consent has been given.   OPERATIVE PROCEDURE: The patient was brought into the OR and placed on the operating table in the supine position. IV sedation was administered by the anesthesia team. A local block was placed along the incision site. The right lower extremity was then prepped and draped in the usual sterile fashion. Attention was directed to the plantar aspect of the right foot where the previous wound had dehisced open and was obviously infected. This was initially debrided away with the VersaJet. A full thickness debridement was taken down to the plantar aspect of the first metatarsal head where the tibial sesamoid had been removed. There was a large amount of deep necrotic tissue. Prior to complete excision of the necrotic and infected tissue, a wound swab was taken. The proximal and distal ends of the incision were  extended approximately 1.5 cm on both ends. Further evaluation of the tendon itself did not reveal any infection up the tendon sheath. The plantar first metatarsal head appeared to be stable and intact without infection. The great toe joint was put through a range of motion and no obvious infection was coming from the wound after multiple irrigation was performed to this wound. At this time, all grossly infected tissue and necrotic tissue was removed completely. I did a final flush, scrubbed the foot with chlorhexidine gluconate. I then flushed everything again and put a dressing on. I packed the wound open with a 4 x 4 and a well padded gauze dressing was placed. Overall, the patient tolerated the procedure and anesthesia well and was transported from the OR to the PACU with all vital signs stable and neurovascular status intact. I will keep him on the floor on IV antibiotics. I will monitor his white blood cell count. He will remain completely nonweightbearing. A strong consideration for wound VAC therapy will be given over the next few days.   ____________________________ Pete Glatter Vickki Muff, DPM jaf:aw D: 10/20/2012 20:29:06 ET T: 10/21/2012 06:41:18 ET JOB#: [id number]  cc: Larkin Ina A. Vickki Muff, DPM, <Dictator> [place] DPM ELECTRONICALLY SIGNED 10/29/2012 11:54

## 2014-09-03 NOTE — Consult Note (Signed)
Benadryl: Other  Zocor: Unknown  Maxzide: Unknown  Vytorin: Unknown   Impression 1. s/p  Excision tibial sesamoid, right first metatarsophalangeal joint now with large ulcer/foul smelling 2./ HTN 3. HLD 4. CAD   Plan 1. agree with Zosyn 2. cont outpatient meds 3. will order A1c to see of patient diabeteic  OUTPT MD Dr Sabra Heck   Electronic Signatures for Addendum Section:  Bettey Costa (MD) (Signed Addendum 09-Jun-14 13:30)  [id number]   Electronic Signatures: Bettey Costa (MD)  (Signed 09-Jun-14 13:29)  Authored: Home Medications, Allergies, Impression/Plan   Last Updated: 09-Jun-14 13:30 by Bettey Costa (MD)

## 2014-09-03 NOTE — Discharge Summary (Signed)
Dates of Admission and Diagnosis:  Date of Admission 20-Oct-2012   Date of Discharge 24-Oct-2012   Admitting Diagnosis Abscess right foot   Final Diagnosis abscess right foot    Chief Complaint/History of Present Illness Pt admitted with abscess to right foot s/p tibial sesamoidectomy 5 days prior.  Pt had developed drainage on POD #2 but didn't present to outpt clinic until POD5#. Pt denied f/c/n/v or pain.   Routine Micro:  09-Jun-14 19:50   Micro Text Report WOUND AER/ANAEROBIC CULT   ORGANISM 1                MODERATE GROWTH ENTEROCOCCUS FAECALIS   ORGANISM 2                LIGHT GROWTH CITROBACTER FREUNDII   ORGANISM 3                LIGHT POSSIBLE OTHER GRAM NEGATIVE ROD   ORGANISM 4 LIGHT GROWTH STAPHYLOCOCCUS AUREUS   GRAM STAIN                MANY WHITE BLOOD CELLS   GRAM STAIN                MODERATE GRAM POSITIVE COCCI IN PAIRS IN CLUSTERS   GRAM STAIN                MODERATE GRAM NEGATIVE ROD   GRAM STAIN RARE GRAM POSITIVE ROD   ANTIBIOTIC                    ORG#1    ORG#2    ORG#3    ORG#4     AMPICILLIN                    S                                    LINEZOLID                     S                                    CEFAZOLIN                    R        R        R         CEFOXITIN                              R        R        R         CEFTAZIDIME                            S        S        S         CEFTRIAXONE                            S        S        S CIPROFLOXACIN  S        S        S         GENTAMICIN                             S        S        S         IMIPENEM                               S        S        S         LEVOFLOXACIN                           S   S        S         TRIMETHOPRIM/SULFAMETHOXAZOLE          S        S        S  Routine Hem:  09-Jun-14 15:10   WBC (CBC)  12.9  10-Jun-14 05:02   WBC (CBC) 8.9  12-Jun-14 05:04   WBC (CBC) 7.4   Hospital Course:  Hospital Course Pt admitted 6/9 and I  & D performed that evening. WBC was elevated upon admission.   Noted severe foul odor intra-op. Marked improvement to foot POD #1 with mild residual drainage. Drainage persisted and 2nd I & D performed on 6/11. Mild residual purulence noted intra-op On 6/12 wound was markedly improved, no foul odor noted and wound bed was healthy and granular.  No purulence was seen. Intra-op cultures grew multiple organisms including enterococcus and MSSA and others with susceptibilities to Augmentin and Cipro. D/C to home on 6/13 with home health for wound vac changes and Home PT to assist with ADL's.   Condition on Discharge [facility]:  Medication Reconciliation: Patient's Home Medications at Discharge:     Medication Instructions  fish oil 1000 mg oral capsule  1  orally once a day    aspirin 81 mg oral tablet  1 tab(s) orally once a day   amlodipine 5 mg oral tablet  1 tab(s) orally once a day   sucralfate 1 g oral tablet  1 tab(s) orally 2 times a day   lipitor 40 mg oral tablet  1  orally once a day (at bedtime)   pantoprazole 40 mg oral delayed release tablet  40 milligram(s) orally 2 times a day   metoprolol tartrate 100 mg oral tablet  1 tab(s) orally 2 times a day   acetaminophen-hydrocodone 325 mg-5 mg oral tablet  1 tab(s) orally every 4 hours, As needed, pain   amoxicillin-clavulanate  875 milligram(s) orally 2 times a day   ciprofloxacin 500 mg oral tablet  1 tab(s) orally every 12 hours     Physician's Instructions:  Treatments None   Dressing Care Wound Vac.  136mmhg, change 3 times per week by home helath.   Diet Regular   Activity Limitations NWB to right foot.   Return to Work Not Applicable   Time frame for Follow Up Appointment 1-2 weeks   Electronic Signatures: Samara Deist (MD)  (Signed 12-Jun-14 19:32)  Authored: ADMISSION DATE AND DIAGNOSIS, CHIEF COMPLAINT/HPI, PERTINENT  [facility],  PATIENT INSTRUCTIONS   Last Updated: 12-Jun-14 19:32 by Samara Deist (MD)

## 2014-12-21 ENCOUNTER — Inpatient Hospital Stay
Admission: EM | Admit: 2014-12-21 | Discharge: 2014-12-24 | DRG: 309 | Disposition: A | Discharge status: Home / Self Care | Payer: PPO | Attending: Internal Medicine | Admitting: Internal Medicine

## 2014-12-21 ENCOUNTER — Encounter: Payer: Self-pay | Admitting: Urgent Care

## 2014-12-21 ENCOUNTER — Emergency Department: Payer: PPO

## 2014-12-21 DIAGNOSIS — R590 Localized enlarged lymph nodes: Secondary | ICD-10-CM | POA: Diagnosis present

## 2014-12-21 DIAGNOSIS — I4891 Unspecified atrial fibrillation: Secondary | ICD-10-CM | POA: Diagnosis present

## 2014-12-21 DIAGNOSIS — J90 Pleural effusion, not elsewhere classified: Secondary | ICD-10-CM | POA: Diagnosis not present

## 2014-12-21 DIAGNOSIS — R9389 Abnormal findings on diagnostic imaging of other specified body structures: Secondary | ICD-10-CM | POA: Diagnosis present

## 2014-12-21 DIAGNOSIS — I2510 Atherosclerotic heart disease of native coronary artery without angina pectoris: Secondary | ICD-10-CM | POA: Diagnosis present

## 2014-12-21 DIAGNOSIS — I4892 Unspecified atrial flutter: Secondary | ICD-10-CM | POA: Diagnosis not present

## 2014-12-21 DIAGNOSIS — Z7982 Long term (current) use of aspirin: Secondary | ICD-10-CM

## 2014-12-21 DIAGNOSIS — Z8249 Family history of ischemic heart disease and other diseases of the circulatory system: Secondary | ICD-10-CM

## 2014-12-21 DIAGNOSIS — Z955 Presence of coronary angioplasty implant and graft: Secondary | ICD-10-CM

## 2014-12-21 DIAGNOSIS — Z87891 Personal history of nicotine dependence: Secondary | ICD-10-CM

## 2014-12-21 DIAGNOSIS — Z951 Presence of aortocoronary bypass graft: Secondary | ICD-10-CM

## 2014-12-21 DIAGNOSIS — I5022 Chronic systolic (congestive) heart failure: Secondary | ICD-10-CM | POA: Diagnosis present

## 2014-12-21 DIAGNOSIS — Z800 Family history of malignant neoplasm of digestive organs: Secondary | ICD-10-CM

## 2014-12-21 DIAGNOSIS — I252 Old myocardial infarction: Secondary | ICD-10-CM

## 2014-12-21 DIAGNOSIS — I119 Hypertensive heart disease without heart failure: Secondary | ICD-10-CM | POA: Diagnosis present

## 2014-12-21 DIAGNOSIS — I10 Essential (primary) hypertension: Secondary | ICD-10-CM | POA: Diagnosis present

## 2014-12-21 DIAGNOSIS — E782 Mixed hyperlipidemia: Secondary | ICD-10-CM | POA: Diagnosis present

## 2014-12-21 DIAGNOSIS — I429 Cardiomyopathy, unspecified: Secondary | ICD-10-CM | POA: Diagnosis present

## 2014-12-21 DIAGNOSIS — J209 Acute bronchitis, unspecified: Secondary | ICD-10-CM | POA: Diagnosis present

## 2014-12-21 DIAGNOSIS — R079 Chest pain, unspecified: Secondary | ICD-10-CM | POA: Diagnosis present

## 2014-12-21 HISTORY — DX: Acute myocardial infarction, unspecified: I21.9

## 2014-12-21 HISTORY — DX: Essential (primary) hypertension: I10

## 2014-12-21 LAB — CBC WITH DIFFERENTIAL/PLATELET
BASOS PCT: 2 %
Basophils Absolute: 0.1 10*3/uL (ref 0–0.1)
EOS PCT: 1 %
Eosinophils Absolute: 0.1 10*3/uL (ref 0–0.7)
HCT: 38.5 % — ABNORMAL LOW (ref 40.0–52.0)
HEMOGLOBIN: 12.8 g/dL — AB (ref 13.0–18.0)
LYMPHS ABS: 1.1 10*3/uL (ref 1.0–3.6)
LYMPHS PCT: 16 %
MCH: 32 pg (ref 26.0–34.0)
MCHC: 33.2 g/dL (ref 32.0–36.0)
MCV: 96.5 fL (ref 80.0–100.0)
MONOS PCT: 33 %
Monocytes Absolute: 2.3 10*3/uL — ABNORMAL HIGH (ref 0.2–1.0)
Neutro Abs: 3.5 10*3/uL (ref 1.4–6.5)
Neutrophils Relative %: 48 %
Platelets: 151 10*3/uL (ref 150–440)
RBC: 3.99 MIL/uL — AB (ref 4.40–5.90)
RDW: 15.1 % — AB (ref 11.5–14.5)
WBC: 7.1 10*3/uL (ref 3.8–10.6)

## 2014-12-21 LAB — COMPREHENSIVE METABOLIC PANEL
ALK PHOS: 65 U/L (ref 38–126)
ALT: 17 U/L (ref 17–63)
AST: 23 U/L (ref 15–41)
Albumin: 4.5 g/dL (ref 3.5–5.0)
Anion gap: 9 (ref 5–15)
BILIRUBIN TOTAL: 0.8 mg/dL (ref 0.3–1.2)
BUN: 31 mg/dL — ABNORMAL HIGH (ref 6–20)
CO2: 21 mmol/L — ABNORMAL LOW (ref 22–32)
CREATININE: 1.52 mg/dL — AB (ref 0.61–1.24)
Calcium: 9.3 mg/dL (ref 8.9–10.3)
Chloride: 105 mmol/L (ref 101–111)
GFR calc non Af Amer: 41 mL/min — ABNORMAL LOW (ref >60)
GFR, EST AFRICAN AMERICAN: 48 mL/min — AB (ref >60)
GLUCOSE: 108 mg/dL — AB (ref 65–99)
Potassium: 4.8 mmol/L (ref 3.5–5.1)
Sodium: 135 mmol/L (ref 135–145)
Total Protein: 8 g/dL (ref 6.5–8.1)

## 2014-12-21 LAB — FIBRIN DERIVATIVES D-DIMER (ARMC ONLY): Fibrin derivatives D-dimer (ARMC): 2100.9 — ABNORMAL HIGH (ref 0–499)

## 2014-12-21 LAB — LIPASE, BLOOD: Lipase: 52 U/L — ABNORMAL HIGH (ref 22–51)

## 2014-12-21 LAB — PROTIME-INR
INR: 1.05
Prothrombin Time: 13.9 seconds (ref 11.4–15.0)

## 2014-12-21 LAB — TROPONIN I: Troponin I: <0.03 ng/mL (ref <0.031)

## 2014-12-21 MED ORDER — IOHEXOL 240 MG/ML SOLN
25.0000 mL | Freq: Once | INTRAMUSCULAR | Status: AC | PRN
  Administered 2014-12-21: 25 mL via ORAL

## 2014-12-21 MED ORDER — DILTIAZEM HCL 25 MG/5ML IV SOLN
10.0000 mg | Freq: Once | INTRAVENOUS | Status: AC
  Administered 2014-12-21: 10 mg via INTRAVENOUS
  Filled 2014-12-21: qty 5

## 2014-12-21 MED ORDER — SODIUM CHLORIDE 0.9 % IV BOLUS (SEPSIS)
500.0000 mL | Freq: Once | INTRAVENOUS | Status: AC
  Administered 2014-12-22: 500 mL via INTRAVENOUS

## 2014-12-21 MED ORDER — DILTIAZEM HCL 30 MG PO TABS
30.0000 mg | ORAL_TABLET | ORAL | Status: AC
  Administered 2014-12-21: 30 mg via ORAL
  Filled 2014-12-21: qty 1

## 2014-12-21 MED ORDER — IOHEXOL 350 MG/ML SOLN
100.0000 mL | Freq: Once | INTRAVENOUS | Status: AC | PRN
  Administered 2014-12-21: 100 mL via INTRAVENOUS

## 2014-12-21 MED ORDER — ASPIRIN 81 MG PO CHEW
324.0000 mg | CHEWABLE_TABLET | Freq: Once | ORAL | Status: AC
  Administered 2014-12-21: 324 mg via ORAL
  Filled 2014-12-21: qty 4

## 2014-12-21 NOTE — ED Provider Notes (Signed)
[facility] Emergency Department Provider Note  ____________________________________________  Time seen: Approximately 8:13 PM  I have reviewed the triage vital signs and the nursing notes.   HISTORY  Chief Complaint Chest Pain and Abdominal Pain    HPI Andres Gutierrez is a 81 y.o. male sure coronary disease and previous bypass partly 2030 years ago. Presents with aching discomfort in the left chest and left mid to lower abdomen for approximately 3 days. He notes he gets on and off again symptoms the last up to an hour where he feels discomfort that is hard to describe over the left chest as well as that left abdomen. No nausea or vomiting. No fevers or chills. Does feel slightly short of breath. Denies any numbness or tingling. No changes in speech. No weakness in arm or leg.  Describes a difficult to describe discomfort and left-sided chest that does not change with exertion. Does have a previous history of coronary disease and cardiac stent. Takes aspirin daily.  No diarrhea. No constipation.  Past Medical History  Diagnosis Date  . Hypertension   . MI (myocardial infarction)     x 2   EF of approximately 35% with cardiomyopathy per [facility] notes  There are no active problems to display for this patient.   Past Surgical History  Procedure Laterality Date  . Coronary artery bypass graft    . Coronary angioplasty with stent placement      Current Outpatient Rx  Name  Route  Sig  Dispense  Refill  . aspirin EC 81 MG tablet   Oral   Take 81 mg by mouth daily.         Marland Kitchen atorvastatin (LIPITOR) 20 MG tablet   Oral   Take 20 mg by mouth daily.         . carvedilol (COREG) 6.25 MG tablet   Oral   Take 6.25 mg by mouth 2 (two) times daily with a meal.         . ciprofloxacin (CIPRO) 500 MG tablet   Oral   Take 500 mg by mouth 2 (two) times daily. Just picked up today- has not started.         . pantoprazole (PROTONIX) 40 MG  tablet   Oral   Take 40 mg by mouth daily.         . potassium chloride (K-DUR) 10 MEQ tablet   Oral   Take 10 mEq by mouth 2 (two) times daily.         . sucralfate (CARAFATE) 1 G tablet   Oral   Take 1 g by mouth 4 (four) times daily.         . tamsulosin (FLOMAX) 0.4 MG CAPS capsule   Oral   Take 0.4 mg by mouth daily.         Marland Kitchen telmisartan (MICARDIS) 80 MG tablet   Oral   Take 80 mg by mouth daily.           Allergies Review of patient's allergies indicates no known allergies.  No family history on file.  Social History History  Substance Use Topics  . Smoking status: Former Research scientist (life sciences)  . Smokeless tobacco: Not on file  . Alcohol Use: Yes    Review of Systems Constitutional: No fever/chills Eyes: No visual changes. ENT: No sore throat. Cardiovascular: See history of present illness  Respiratory: See history of present illness  Gastrointestinal: See history of present illness No nausea, no vomiting.  No diarrhea.  No constipation. Genitourinary: Negative for dysuria. Musculoskeletal: Negative for back pain. Skin: Negative for rash. Neurological: Negative for headaches, focal weakness or numbness.  10-point ROS otherwise negative.  ____________________________________________   PHYSICAL EXAM:  VITAL SIGNS: ED Triage Vitals  Enc Vitals Group     BP 12/21/14 2005 103/69 mmHg     Pulse Rate 12/21/14 2005 137     Resp 12/21/14 2005 22     Temp 12/21/14 2005 98 F (36.7 C)     Temp Source 12/21/14 2005 Oral     SpO2 12/21/14 2005 93 %     Weight 12/21/14 2005 220 lb (99.791 kg)     Height 12/21/14 2005 6\' 4"  (1.93 m)     Head Cir --      Peak Flow --      Pain Score 12/21/14 2006 4     Pain Loc --      Pain Edu? --      Excl. in [place]? --     Constitutional: Alert and oriented. Well appearing and in no acute distress. Eyes: Conjunctivae are normal. PERRL. EOMI. Head: Atraumatic. Nose: No congestion/rhinnorhea. Mouth/Throat: Mucous  membranes are moist.  Oropharynx non-erythematous. Neck: No stridor.   Cardiovascular: Irregular rate n rhythm. Grossly normal heart sounds.  Good peripheral circulation. Respiratory: Normal respiratory effort.  No retractions. Lungs CTAB. No rales. Gastrointestinal: Soft and nontender except for some mild tenderness without peritonitis in the left upper and left mid abdomen. No distention. No abdominal bruits. No CVA tenderness. Musculoskeletal: No lower extremity tenderness nor edema.  No joint effusions. No venous cords. No thigh pain or tenderness. Neurologic:  Normal speech and language. No gross focal neurologic deficits are appreciated. Skin:  Skin is warm, dry and intact. No rash noted. Psychiatric: Mood and affect are normal. Speech and behavior are normal.  ____________________________________________   LABS (all labs ordered are listed, but only abnormal results are displayed)  Labs Reviewed  CBC WITH DIFFERENTIAL/PLATELET - Abnormal; Notable for the following:    RBC 3.99 (*)    Hemoglobin 12.8 (*)    HCT 38.5 (*)    RDW 15.1 (*)    Monocytes Absolute 2.3 (*)    All other components within normal limits  COMPREHENSIVE METABOLIC PANEL - Abnormal; Notable for the following:    CO2 21 (*)    Glucose, Bld 108 (*)    BUN 31 (*)    Creatinine, Ser 1.52 (*)    GFR calc non Af Amer 41 (*)    GFR calc Af Amer 48 (*)    All other components within normal limits  LIPASE, BLOOD - Abnormal; Notable for the following:    Lipase 52 (*)    All other components within normal limits  FIBRIN DERIVATIVES D-DIMER ([facility] ONLY) - Abnormal; Notable for the following:    Fibrin derivatives D-dimer ([facility]) 2100.9 (*)    All other components within normal limits  TROPONIN I  PROTIME-INR   ____________________________________________  EKG  Reviewed and interpreted by me Ventricular rate 1:30 PR 88 QRS 100 QTC 540 Sinus tachycardia, ventricular rate 1:30 Inferior T wave inversions  notable into 3 and aVF as well as in lateral V6 Anteroseptal T waves appear slightly peaked in appearance, possibly indicative of electrolyte abnormality and/or hyperacute T-wave  As compared with previous EKG from December 2012 there is new T-wave inversions and new T-wave abnormality noted anteroseptal leads  Based upon this EKG there is concern for active cardiac ischemia  but no indication of acute ST elevation  ----------------------------------------- 8:37 PM on 12/21/2014 -----------------------------------------  Rhythm strip reviewed and interpreted by me at 2031 Appears consistent with atrial flutter, with approximately 2-1 conduction with ventricular rate of approximately 130 Inferior T wave inversions notable into 3 and aVF as well as in lateral V6 Anteroseptal T waves appear slightly peaked in appearance, possibly indicative of electrolyte abnormality and/or hyperacute T-wave. ____________________________________________  RADIOLOGY  IMPRESSION: 1. Enlarged right hilar, right peribronchial and mediastinal nodes, measuring up to 1.4 cm in short axis. Underlying malignancy cannot be excluded. These would be amenable to transbronchial biopsy, as deemed clinically appropriate. 2. Trace right-sided pleural fluid, mildly loculated in appearance. No definite evidence of right-sided malignancy. Scarring and atelectasis at the right lung base. 3. Pleural calcification and medial right basilar pleural soft tissue is more prominent than in 2011, though the chronicity suggests against malignancy. Would correlate for evidence of prior asbestos exposure. 4. Small bulla at the left lung apex. 5. Diffuse coronary artery calcifications seen. 6. Bilateral renal cysts seen. Small bilateral nonobstructing renal stones measure up to 4 mm in size. 7. Scattered calcification along the abdominal aorta and its branches. 8. Enlarged prostate  noted. ____________________________________________   PROCEDURES  Procedure(s) performed: None  Critical Care performed: No  ____________________________________________   INITIAL IMPRESSION / ASSESSMENT AND PLAN / ED COURSE  Pertinent labs & imaging results that were available during my care of the patient were reviewed by me and considered in my medical decision making (see chart for details).   Patient presents with difficulty describing left-sided chest discomfort, minimal feeling of shortness of breath and also some tenderness in the left mid abdomen. Really no systemic symptoms of fever or infectious illness, but notable he does appear to now be in atrial flutter which is a new diagnosis. Unclear if the atrial flutter is brought about by a secondary cause. Differential diagnosis is very broad, may include electrolyte abnormality, coronary disease and ischemia, pulmonary embolus him, intra-abdominal pain or infection such as mild diverticulitis, or other conditions. I we'll trial him with 10 mg of diltiazem for rate control, will watch him closely. I plan to perform CT scan to rule out condition such as pulmonary embolus him, and evaluate for etiology of abdominal pain.  ----------------------------------------- 9:41 PM on 12/21/2014 -----------------------------------------  Reevaluated the patient and discussed with him and his family. He reports his chest symptoms are improved, and notably he is noted to remain in atrial flutter but rate controlled in the 80s now. I'll give him diltiazem by mouth for ongoing rate control. Still notes mild to moderate left-sided abdominal pain. He is pending CT at this time. Labs reviewed, troponin is negative.  ----------------------------------------- 11:45 PM on 12/21/2014 -----------------------------------------  Patient resting comfortably at this time. Repeat CT findings with the patient and his family. Constellation of findings is  suspicious for potential malignancy though does not appear to be obvious. He is no longer having any chest pain. I discussed this with the patient and his family, and he will certainly follow-up with his doctor regarding his CAT scan results. We will admit him to the hospital for ongoing evaluation of his chest pain as he does have EKG abnormalities, but troponin is negative and he is a not having active chest pain at this time.  Discussed with Dr. Jannifer Franklin will admit.  ____________________________________________   FINAL CLINICAL IMPRESSION(S) / ED DIAGNOSES  Final diagnoses:  Left sided chest pain  New onset atrial flutter  Chest pain,  unspecified chest pain type  Lymphadenopathy, mediastinal  Pleural effusion      Delman Kitten, MD 12/21/14 2348

## 2014-12-21 NOTE — ED Notes (Signed)
Patient presents with c/o LEFT chest pain and mid-abdominal pain since Friday. (+) SOB. PMH significant for MI x 2; 3 vessel CABG (in the 1980s). Denies N/V and diaphoresis. Has appoint with Dr. Sabra Heck in the am, however patient reports that he has generally felt worse today.

## 2014-12-21 NOTE — ED Notes (Signed)
Pt presents from home with c/o left sided chest pain and left sided abdominal pain since Friday. Pt has hx of heart issues (CABG 30+ years ago). He reports that he has had some SOB but denies n/v. Pt alert & oriented with warm, dry skin and NAD noted.

## 2014-12-22 ENCOUNTER — Encounter: Payer: Self-pay | Admitting: Internal Medicine

## 2014-12-22 ENCOUNTER — Inpatient Hospital Stay
Admit: 2014-12-22 | Discharge: 2014-12-22 | Disposition: A | Discharge status: Home / Self Care | Payer: PPO | Attending: Internal Medicine | Admitting: Internal Medicine

## 2014-12-22 DIAGNOSIS — I4891 Unspecified atrial fibrillation: Secondary | ICD-10-CM | POA: Diagnosis present

## 2014-12-22 DIAGNOSIS — Z87891 Personal history of nicotine dependence: Secondary | ICD-10-CM | POA: Diagnosis not present

## 2014-12-22 DIAGNOSIS — I429 Cardiomyopathy, unspecified: Secondary | ICD-10-CM | POA: Diagnosis present

## 2014-12-22 DIAGNOSIS — I4892 Unspecified atrial flutter: Secondary | ICD-10-CM | POA: Diagnosis present

## 2014-12-22 DIAGNOSIS — Z951 Presence of aortocoronary bypass graft: Secondary | ICD-10-CM | POA: Diagnosis not present

## 2014-12-22 DIAGNOSIS — J90 Pleural effusion, not elsewhere classified: Secondary | ICD-10-CM | POA: Diagnosis present

## 2014-12-22 DIAGNOSIS — I119 Hypertensive heart disease without heart failure: Secondary | ICD-10-CM | POA: Diagnosis present

## 2014-12-22 DIAGNOSIS — I2510 Atherosclerotic heart disease of native coronary artery without angina pectoris: Secondary | ICD-10-CM | POA: Diagnosis present

## 2014-12-22 DIAGNOSIS — J209 Acute bronchitis, unspecified: Secondary | ICD-10-CM | POA: Diagnosis present

## 2014-12-22 DIAGNOSIS — Z955 Presence of coronary angioplasty implant and graft: Secondary | ICD-10-CM | POA: Diagnosis not present

## 2014-12-22 DIAGNOSIS — R590 Localized enlarged lymph nodes: Secondary | ICD-10-CM | POA: Diagnosis present

## 2014-12-22 DIAGNOSIS — Z800 Family history of malignant neoplasm of digestive organs: Secondary | ICD-10-CM | POA: Diagnosis not present

## 2014-12-22 DIAGNOSIS — E782 Mixed hyperlipidemia: Secondary | ICD-10-CM | POA: Diagnosis present

## 2014-12-22 DIAGNOSIS — R9389 Abnormal findings on diagnostic imaging of other specified body structures: Secondary | ICD-10-CM | POA: Diagnosis present

## 2014-12-22 DIAGNOSIS — I10 Essential (primary) hypertension: Secondary | ICD-10-CM | POA: Diagnosis present

## 2014-12-22 DIAGNOSIS — I5022 Chronic systolic (congestive) heart failure: Secondary | ICD-10-CM | POA: Diagnosis present

## 2014-12-22 DIAGNOSIS — Z7982 Long term (current) use of aspirin: Secondary | ICD-10-CM | POA: Diagnosis not present

## 2014-12-22 DIAGNOSIS — R079 Chest pain, unspecified: Secondary | ICD-10-CM | POA: Diagnosis present

## 2014-12-22 DIAGNOSIS — I252 Old myocardial infarction: Secondary | ICD-10-CM | POA: Diagnosis not present

## 2014-12-22 DIAGNOSIS — Z8249 Family history of ischemic heart disease and other diseases of the circulatory system: Secondary | ICD-10-CM | POA: Diagnosis not present

## 2014-12-22 LAB — APTT: APTT: 37 s — AB (ref 24–36)

## 2014-12-22 LAB — TROPONIN I
TROPONIN I: 0.03 ng/mL (ref <0.031)
Troponin I: <0.03 ng/mL (ref <0.031)
Troponin I: 0.03 ng/mL (ref <0.031)

## 2014-12-22 LAB — HEPARIN LEVEL (UNFRACTIONATED)
HEPARIN UNFRACTIONATED: 1.01 [IU]/mL — AB (ref 0.30–0.70)
Heparin Unfractionated: 0.67 IU/mL (ref 0.30–0.70)

## 2014-12-22 LAB — BASIC METABOLIC PANEL
ANION GAP: 6 (ref 5–15)
BUN: 27 mg/dL — AB (ref 6–20)
CALCIUM: 8.5 mg/dL — AB (ref 8.9–10.3)
CO2: 24 mmol/L (ref 22–32)
Chloride: 106 mmol/L (ref 101–111)
Creatinine, Ser: 1.4 mg/dL — ABNORMAL HIGH (ref 0.61–1.24)
GFR calc Af Amer: 53 mL/min — ABNORMAL LOW (ref >60)
GFR calc non Af Amer: 46 mL/min — ABNORMAL LOW (ref >60)
Glucose, Bld: 103 mg/dL — ABNORMAL HIGH (ref 65–99)
Potassium: 4.7 mmol/L (ref 3.5–5.1)
SODIUM: 136 mmol/L (ref 135–145)

## 2014-12-22 LAB — TSH: TSH: 1.766 u[IU]/mL (ref 0.350–4.500)

## 2014-12-22 MED ORDER — SODIUM CHLORIDE 0.9 % IJ SOLN
3.0000 mL | Freq: Two times a day (BID) | INTRAMUSCULAR | Status: DC
  Administered 2014-12-22: 3 mL via INTRAVENOUS

## 2014-12-22 MED ORDER — HEPARIN (PORCINE) IN NACL 100-0.45 UNIT/ML-% IJ SOLN
1500.0000 [IU]/h | INTRAMUSCULAR | Status: DC
  Administered 2014-12-22: 1500 [IU]/h via INTRAVENOUS
  Filled 2014-12-22: qty 250

## 2014-12-22 MED ORDER — HEPARIN BOLUS VIA INFUSION
6000.0000 [IU] | Freq: Once | INTRAVENOUS | Status: AC
  Administered 2014-12-22: 6000 [IU] via INTRAVENOUS
  Filled 2014-12-22: qty 6000

## 2014-12-22 MED ORDER — DM-GUAIFENESIN ER 30-600 MG PO TB12: 1.0000 | ORAL_TABLET | Freq: Two times a day (BID) | ORAL | Status: DC | PRN

## 2014-12-22 MED ORDER — ATORVASTATIN CALCIUM 20 MG PO TABS
20.0000 mg | ORAL_TABLET | Freq: Every day | ORAL | Status: DC
  Administered 2014-12-22 – 2014-12-24 (×3): 20 mg via ORAL
  Filled 2014-12-22 (×3): qty 1

## 2014-12-22 MED ORDER — POTASSIUM CHLORIDE CRYS ER 10 MEQ PO TBCR
10.0000 meq | EXTENDED_RELEASE_TABLET | Freq: Every day | ORAL | Status: DC
  Administered 2014-12-23 – 2014-12-24 (×2): 10 meq via ORAL
  Filled 2014-12-22 (×2): qty 1

## 2014-12-22 MED ORDER — ONDANSETRON HCL 4 MG/2ML IJ SOLN: 4.0000 mg | Freq: Four times a day (QID) | INTRAMUSCULAR | Status: DC | PRN

## 2014-12-22 MED ORDER — TAMSULOSIN HCL 0.4 MG PO CAPS
0.4000 mg | ORAL_CAPSULE | Freq: Every day | ORAL | Status: DC
  Administered 2014-12-22 – 2014-12-24 (×3): 0.4 mg via ORAL
  Filled 2014-12-22 (×3): qty 1

## 2014-12-22 MED ORDER — IRBESARTAN 150 MG PO TABS
75.0000 mg | ORAL_TABLET | Freq: Every day | ORAL | Status: DC
  Administered 2014-12-22: 75 mg via ORAL
  Filled 2014-12-22 (×2): qty 0.5

## 2014-12-22 MED ORDER — ASPIRIN EC 81 MG PO TBEC
81.0000 mg | DELAYED_RELEASE_TABLET | Freq: Every day | ORAL | Status: DC
  Administered 2014-12-22 – 2014-12-24 (×3): 81 mg via ORAL
  Filled 2014-12-22 (×3): qty 1

## 2014-12-22 MED ORDER — CARVEDILOL 6.25 MG PO TABS
6.2500 mg | ORAL_TABLET | Freq: Two times a day (BID) | ORAL | Status: DC
  Administered 2014-12-22 – 2014-12-24 (×5): 6.25 mg via ORAL
  Filled 2014-12-22 (×5): qty 1

## 2014-12-22 MED ORDER — ACETAMINOPHEN 650 MG RE SUPP: 650.0000 mg | Freq: Four times a day (QID) | RECTAL | Status: DC | PRN

## 2014-12-22 MED ORDER — HEPARIN (PORCINE) IN NACL 100-0.45 UNIT/ML-% IJ SOLN
1200.0000 [IU]/h | INTRAMUSCULAR | Status: DC
  Administered 2014-12-22: 1200 [IU]/h via INTRAVENOUS
  Filled 2014-12-22 (×2): qty 250

## 2014-12-22 MED ORDER — SUCRALFATE 1 G PO TABS
1.0000 g | ORAL_TABLET | Freq: Four times a day (QID) | ORAL | Status: DC
  Administered 2014-12-22 – 2014-12-24 (×9): 1 g via ORAL
  Filled 2014-12-22 (×9): qty 1

## 2014-12-22 MED ORDER — SODIUM CHLORIDE 0.9 % IV SOLN
INTRAVENOUS | Status: DC
  Administered 2014-12-22: 05:00:00 via INTRAVENOUS

## 2014-12-22 MED ORDER — ONDANSETRON HCL 4 MG PO TABS: 4.0000 mg | ORAL_TABLET | Freq: Four times a day (QID) | ORAL | Status: DC | PRN

## 2014-12-22 MED ORDER — PANTOPRAZOLE SODIUM 40 MG PO TBEC
40.0000 mg | DELAYED_RELEASE_TABLET | Freq: Every day | ORAL | Status: DC
  Administered 2014-12-22 – 2014-12-24 (×3): 40 mg via ORAL
  Filled 2014-12-22 (×3): qty 1

## 2014-12-22 MED ORDER — DEXTROMETHORPHAN POLISTIREX ER 30 MG/5ML PO SUER
15.0000 mg | Freq: Four times a day (QID) | ORAL | Status: DC | PRN
  Filled 2014-12-22: qty 5

## 2014-12-22 MED ORDER — ACETAMINOPHEN 325 MG PO TABS: 650.0000 mg | ORAL_TABLET | Freq: Four times a day (QID) | ORAL | Status: DC | PRN

## 2014-12-22 MED ORDER — DILTIAZEM HCL ER COATED BEADS 180 MG PO CP24
180.0000 mg | ORAL_CAPSULE | Freq: Every day | ORAL | Status: DC
  Administered 2014-12-22 – 2014-12-23 (×2): 180 mg via ORAL
  Filled 2014-12-22 (×2): qty 1

## 2014-12-22 MED ORDER — GUAIFENESIN ER 600 MG PO TB12
600.0000 mg | ORAL_TABLET | Freq: Two times a day (BID) | ORAL | Status: DC | PRN
  Administered 2014-12-22 – 2014-12-24 (×2): 600 mg via ORAL
  Filled 2014-12-22 (×2): qty 1

## 2014-12-22 MED ORDER — ASPIRIN EC 81 MG PO TBEC: 81.0000 mg | DELAYED_RELEASE_TABLET | Freq: Every day | ORAL | Status: DC

## 2014-12-22 MED ORDER — POTASSIUM CHLORIDE CRYS ER 10 MEQ PO TBCR
10.0000 meq | EXTENDED_RELEASE_TABLET | Freq: Two times a day (BID) | ORAL | Status: DC
  Administered 2014-12-22 (×3): 10 meq via ORAL
  Filled 2014-12-22 (×3): qty 1

## 2014-12-22 NOTE — Progress Notes (Signed)
Pt resting quietly today. Complaints of cough improved by medication. No other complaints today. Heparin drip adjusted per pharmacy orders.

## 2014-12-22 NOTE — Progress Notes (Signed)
Patient complaining of cough; requests cough medicine. Dr. Sabra Heck paged twice - waiting for response.

## 2014-12-22 NOTE — H&P (Signed)
[facility] [place] NAME: Andres Gutierrez    MR#:  [id number]  DATE OF BIRTH:  Apr 15, 1931  DATE OF ADMISSION:  12/21/2014  PRIMARY CARE PHYSICIAN: Rusty Aus., MD   REQUESTING/REFERRING PHYSICIAN: Dr. Jacqualine Code  CHIEF COMPLAINT:   Chief Complaint  Patient presents with  . Chest Pain  . Abdominal Pain    HISTORY OF PRESENT ILLNESS:  Andres Gutierrez  is a 81 y.o. male with a known history of hypertension, coronary artery disease/MI status post CABG, stent, cardiomyopathy with ejection fraction 35%, hyperlipidemia, BPH presents to the emergency room with the complaints of ongoing left-sided chest pain and vague abdominal pain for the past 3 days. Does have some chronic shortness of breath but denies any palpitations, dizziness, focal weakness or numbness, nausea, vomiting, diarrhea, dysuria. On arrival patient was noted to be with stable vital signs and EKG showed atrial flutter with a rate of around 1 30 bpm. Patient received Cardizem 10 mg IV push following which her heart rate was controlled. Patient continued to be in atrial flutter. Denies any recent fever, cough, chills. Evaluation revealed BUN/creatinine 31 over 1.52, troponin less than 0.03, chest x-ray cardiomegaly +. CT angiogram of the chest negative for pulmonary embolism but CT chest revealed trace right pleural effusion, vague peribronchial and paraspinal densities at the right lung base, enlarged right hilar, peribronchial and mediastinal nodes. Hospitalist service was consulted for further management. At the current time patient is comfortably resting in the bed and denies any complaints.  PAST MEDICAL HISTORY:   Past Medical History  Diagnosis Date  . Hypertension   . MI (myocardial infarction)     x 2    PAST SURGICAL HISTORY:   Past Surgical History  Procedure Laterality Date  . Coronary artery bypass graft    . Coronary angioplasty with stent placement      SOCIAL  HISTORY:   Social History  Substance Use Topics  . Smoking status: Former Research scientist (life sciences)  . Smokeless tobacco: Not on file  . Alcohol Use: Yes    FAMILY HISTORY:   Family History  Problem Relation Age of Onset  . CAD Mother   . Colon cancer Father     DRUG ALLERGIES:  No Known Allergies  REVIEW OF SYSTEMS:   Review of Systems  Constitutional: Negative for fever, chills and malaise/fatigue.  HENT: Negative for ear pain, hearing loss, nosebleeds, sore throat and tinnitus.   Eyes: Negative for blurred vision, double vision, pain, discharge and redness.  Respiratory: Positive for shortness of breath. Negative for cough, hemoptysis, sputum production and wheezing.   Cardiovascular: Positive for chest pain. Negative for palpitations, orthopnea and leg swelling.  Gastrointestinal: Negative for nausea, vomiting, abdominal pain, diarrhea, constipation, blood in stool and melena.  Genitourinary: Negative for dysuria, urgency, frequency and hematuria.  Musculoskeletal: Negative for back pain, joint pain and neck pain.  Skin: Negative for itching and rash.  Neurological: Negative for dizziness, tingling, sensory change, focal weakness and seizures.  Endo/Heme/Allergies: Does not bruise/bleed easily.  Psychiatric/Behavioral: Negative for depression. The patient is not nervous/anxious.     MEDICATIONS AT HOME:   Prior to Admission medications   Medication Sig Start Date End Date Taking? Authorizing Provider  aspirin EC 81 MG tablet Take 81 mg by mouth daily.   Yes Historical Provider, MD  atorvastatin (LIPITOR) 20 MG tablet Take 20 mg by mouth daily.   Yes Historical Provider, MD  carvedilol (COREG) 6.25 MG tablet Take 6.25  mg by mouth 2 (two) times daily with a meal.   Yes Historical Provider, MD  ciprofloxacin (CIPRO) 500 MG tablet Take 500 mg by mouth 2 (two) times daily. Just picked up today- has not started.   Yes Historical Provider, MD  pantoprazole (PROTONIX) 40 MG tablet Take 40 mg by  mouth daily.   Yes Historical Provider, MD  potassium chloride (K-DUR) 10 MEQ tablet Take 10 mEq by mouth 2 (two) times daily.   Yes Historical Provider, MD  sucralfate (CARAFATE) 1 G tablet Take 1 g by mouth 4 (four) times daily.   Yes Historical Provider, MD  tamsulosin (FLOMAX) 0.4 MG CAPS capsule Take 0.4 mg by mouth daily.   Yes Historical Provider, MD  telmisartan (MICARDIS) 80 MG tablet Take 80 mg by mouth daily.   Yes Historical Provider, MD      VITAL SIGNS:  Blood pressure 116/59, pulse 87, temperature 98.2 F (36.8 C), temperature source Oral, resp. rate 18, height 6\' 4"  (1.93 m), weight 102.513 kg (226 lb), SpO2 95 %.  PHYSICAL EXAMINATION:  Physical Exam  Constitutional: He is oriented to person, place, and time. He appears well-developed and well-nourished. No distress.  HENT:  Head: Normocephalic and atraumatic.  Right Ear: External ear normal.  Left Ear: External ear normal.  Nose: Nose normal.  Mouth/Throat: Oropharynx is clear and moist. No oropharyngeal exudate.  Eyes: EOM are normal. Pupils are equal, round, and reactive to light. No scleral icterus.  Neck: Normal range of motion. Neck supple. No JVD present. No thyromegaly present.  Cardiovascular: Normal rate, normal heart sounds and intact distal pulses.  Exam reveals no friction rub.   No murmur heard. Irregular rhythm  Respiratory: Effort normal and breath sounds normal. No respiratory distress. He has no wheezes. He has no rales. He exhibits no tenderness.  GI: Soft. Bowel sounds are normal. He exhibits no distension and no mass. There is no tenderness. There is no rebound and no guarding.  Musculoskeletal: Normal range of motion. He exhibits no edema.  Lymphadenopathy:    He has no cervical adenopathy.  Neurological: He is alert and oriented to person, place, and time. He has normal reflexes. He displays normal reflexes. No cranial nerve deficit. He exhibits normal muscle tone.  Skin: Skin is warm. No rash  noted. No erythema.  Psychiatric: He has a normal mood and affect. His behavior is normal. Thought content normal.   LABORATORY PANEL:   CBC  Recent Labs Lab 12/21/14 2047  WBC 7.1  HGB 12.8*  HCT 38.5*  PLT 151   ------------------------------------------------------------------------------------------------------------------  Chemistries   Recent Labs Lab 12/21/14 2047 12/22/14 0417  NA 135 136  K 4.8 4.7  CL 105 106  CO2 21* 24  GLUCOSE 108* 103*  BUN 31* 27*  CREATININE 1.52* 1.40*  CALCIUM 9.3 8.5*  AST 23  --   ALT 17  --   ALKPHOS 65  --   BILITOT 0.8  --    ------------------------------------------------------------------------------------------------------------------  Cardiac Enzymes  Recent Labs Lab 12/22/14 0411  TROPONINI <0.03   ------------------------------------------------------------------------------------------------------------------  RADIOLOGY:  Ct Angio Chest Pe W/cm &/or Wo Cm  12/21/2014   CLINICAL DATA:  Acute onset of left-sided chest pain and mid abdominal pain. Shortness of breath. Initial encounter.  EXAM: CT ANGIOGRAPHY CHEST  CT ABDOMEN AND PELVIS WITH CONTRAST  TECHNIQUE: Multidetector CT imaging of the chest was performed using the standard protocol during bolus administration of intravenous contrast. Multiplanar CT image reconstructions and MIPs were obtained  to evaluate the vascular anatomy. Multidetector CT imaging of the abdomen and pelvis was performed using the standard protocol during bolus administration of intravenous contrast.  CONTRAST:  160mL OMNIPAQUE IOHEXOL 350 MG/ML SOLN  COMPARISON:  CT of the abdomen and pelvis performed 02/13/2011, and chest radiograph performed earlier today at 8:53 p.m.  FINDINGS: CTA CHEST FINDINGS  There is no evidence of pulmonary embolus.  Trace right-sided pleural fluid is noted, mildly loculated in appearance. Vague pleural and paraspinal soft tissue density is noted along the medial right  lung base, with scattered pleural calcification. This is more prominent than in 2011, though the chronicity suggests against malignancy.  Scarring and atelectasis are noted at the right lung base. A small bulla is noted at the left lung apex. There is no evidence of pneumothorax.  Enlarged right hilar nodes are seen, measuring up to 1.4 cm in short axis. Enlarged subcarinal and azygoesophageal recess nodes are noted, measuring up to 1.4 cm in short axis. There is a 1.2 cm peribronchial node at the right lower lobe.  Diffuse coronary artery calcifications are seen. No pericardial effusion is identified. Scattered calcification is noted along the proximal great vessels. The patient is status post median sternotomy. No axillary lymphadenopathy is seen. The visualized portions of the thyroid gland are unremarkable in appearance.  No acute osseous abnormalities are seen.  CT ABDOMEN and PELVIS FINDINGS  The liver and spleen are unremarkable in appearance. The gallbladder is within normal limits. The pancreas and adrenal glands are unremarkable.  Bilateral renal cysts are seen, measuring up to 4.3 cm in size. Nonspecific perinephric stranding is noted bilaterally. There is no evidence of hydronephrosis. Small bilateral renal stones measure up to 4 mm in size. There is no evidence of hydronephrosis. No obstructing ureteral stones are seen.  No free fluid is identified. The small bowel is unremarkable in appearance. The stomach is within normal limits. No acute vascular abnormalities are seen. Scattered calcification is noted along the abdominal aorta and its branches.  The patient is status post appendectomy. The colon is unremarkable in appearance.  The bladder is mildly distended and grossly unremarkable. The prostate is enlarged, measuring 5.4 cm in transverse dimension. No inguinal lymphadenopathy is seen.  No acute osseous abnormalities are identified. There is grade 1 anterolisthesis of L4 on L5, reflecting  underlying facet disease. Vacuum phenomenon and disc space narrowing are seen at L5-S1.  Review of the MIP images confirms the above findings.  IMPRESSION: 1. Enlarged right hilar, right peribronchial and mediastinal nodes, measuring up to 1.4 cm in short axis. Underlying malignancy cannot be excluded. These would be amenable to transbronchial biopsy, as deemed clinically appropriate. 2. Trace right-sided pleural fluid, mildly loculated in appearance. No definite evidence of right-sided malignancy. Scarring and atelectasis at the right lung base. 3. Pleural calcification and medial right basilar pleural soft tissue is more prominent than in 2011, though the chronicity suggests against malignancy. Would correlate for evidence of prior asbestos exposure. 4. Small bulla at the left lung apex. 5. Diffuse coronary artery calcifications seen. 6. Bilateral renal cysts seen. Small bilateral nonobstructing renal stones measure up to 4 mm in size. 7. Scattered calcification along the abdominal aorta and its branches. 8. Enlarged prostate noted.   Electronically Signed   By: Garald Balding M.D.   On: 12/21/2014 22:24   Ct Abdomen Pelvis W Contrast  12/21/2014   CLINICAL DATA:  Acute onset of left-sided chest pain and mid abdominal pain. Shortness of breath. Initial encounter.  EXAM: CT ANGIOGRAPHY CHEST  CT ABDOMEN AND PELVIS WITH CONTRAST  TECHNIQUE: Multidetector CT imaging of the chest was performed using the standard protocol during bolus administration of intravenous contrast. Multiplanar CT image reconstructions and MIPs were obtained to evaluate the vascular anatomy. Multidetector CT imaging of the abdomen and pelvis was performed using the standard protocol during bolus administration of intravenous contrast.  CONTRAST:  131mL OMNIPAQUE IOHEXOL 350 MG/ML SOLN  COMPARISON:  CT of the abdomen and pelvis performed 02/13/2011, and chest radiograph performed earlier today at 8:53 p.m.  FINDINGS: CTA CHEST FINDINGS  There  is no evidence of pulmonary embolus.  Trace right-sided pleural fluid is noted, mildly loculated in appearance. Vague pleural and paraspinal soft tissue density is noted along the medial right lung base, with scattered pleural calcification. This is more prominent than in 2011, though the chronicity suggests against malignancy.  Scarring and atelectasis are noted at the right lung base. A small bulla is noted at the left lung apex. There is no evidence of pneumothorax.  Enlarged right hilar nodes are seen, measuring up to 1.4 cm in short axis. Enlarged subcarinal and azygoesophageal recess nodes are noted, measuring up to 1.4 cm in short axis. There is a 1.2 cm peribronchial node at the right lower lobe.  Diffuse coronary artery calcifications are seen. No pericardial effusion is identified. Scattered calcification is noted along the proximal great vessels. The patient is status post median sternotomy. No axillary lymphadenopathy is seen. The visualized portions of the thyroid gland are unremarkable in appearance.  No acute osseous abnormalities are seen.  CT ABDOMEN and PELVIS FINDINGS  The liver and spleen are unremarkable in appearance. The gallbladder is within normal limits. The pancreas and adrenal glands are unremarkable.  Bilateral renal cysts are seen, measuring up to 4.3 cm in size. Nonspecific perinephric stranding is noted bilaterally. There is no evidence of hydronephrosis. Small bilateral renal stones measure up to 4 mm in size. There is no evidence of hydronephrosis. No obstructing ureteral stones are seen.  No free fluid is identified. The small bowel is unremarkable in appearance. The stomach is within normal limits. No acute vascular abnormalities are seen. Scattered calcification is noted along the abdominal aorta and its branches.  The patient is status post appendectomy. The colon is unremarkable in appearance.  The bladder is mildly distended and grossly unremarkable. The prostate is enlarged,  measuring 5.4 cm in transverse dimension. No inguinal lymphadenopathy is seen.  No acute osseous abnormalities are identified. There is grade 1 anterolisthesis of L4 on L5, reflecting underlying facet disease. Vacuum phenomenon and disc space narrowing are seen at L5-S1.  Review of the MIP images confirms the above findings.  IMPRESSION: 1. Enlarged right hilar, right peribronchial and mediastinal nodes, measuring up to 1.4 cm in short axis. Underlying malignancy cannot be excluded. These would be amenable to transbronchial biopsy, as deemed clinically appropriate. 2. Trace right-sided pleural fluid, mildly loculated in appearance. No definite evidence of right-sided malignancy. Scarring and atelectasis at the right lung base. 3. Pleural calcification and medial right basilar pleural soft tissue is more prominent than in 2011, though the chronicity suggests against malignancy. Would correlate for evidence of prior asbestos exposure. 4. Small bulla at the left lung apex. 5. Diffuse coronary artery calcifications seen. 6. Bilateral renal cysts seen. Small bilateral nonobstructing renal stones measure up to 4 mm in size. 7. Scattered calcification along the abdominal aorta and its branches. 8. Enlarged prostate noted.   Electronically Signed  By: Garald Balding M.D.   On: 12/21/2014 22:24   Dg Chest Port 1 View  12/21/2014   CLINICAL DATA:  Abdominal pain.  Initial encounter.  EXAM: PORTABLE CHEST - 1 VIEW  COMPARISON:  04/21/2011.  FINDINGS: Cardiopericardial silhouette is enlarged. CABG. Monitoring leads project over the chest. No airspace disease. No effusion.  IMPRESSION: Cardiomegaly without failure.   Electronically Signed   By: Dereck Ligas M.D.   On: 12/21/2014 21:07    EKG:   Orders placed or performed during the hospital encounter of 12/21/14  . ED EKG  . ED EKG  . EKG 12-Lead  . EKG 12-Lead  . EKG 12-Lead  . EKG 12-Lead  Atrial flutter with 2 is 21 conduction, ventricular rate of 1 30 bpm.  Inferior T-wave abnormality.  IMPRESSION AND PLAN:   1. Left-sided chest pain ongoing for the past 3 days. History of coronary artery disease status post CABG, stent. Troponin 1 WNL. EKG new-onset atrial flutter with inferior T-wave abnormality. Rule out acute coronary event. 2. New onset atrial flutter, received IV Cardizem in the ED-rate control at present. Plan: Admit to telemetry, IV heparin, continue aspirin, beta blocker, statin, nitroglycerin, cycle cardiac enzymes, Cardizem by mouth. Request echocardiogram and cardiac consultation for further evaluation and advice. 3. Abnormal CT chest with enlarged right hilar, peribronchial and mediastinal nodes with associated trace right pleural effusion-need further evaluation. Patient stable clinically. Pulmonary consult requested. 4. Coronary artery disease status post CABG. Plan as above. 5. Hypertension, stable on home medications. Continue same 6. Hyperlipidemia, stable on statin. Continue same. 7. BPH, stable on home medications. Continue same. 8. Elevated creatinine of 1.52-AK I likely secondary to dehydration. Encourage hydration, follow-up BMP.    All the records are reviewed and case discussed with ED provider. Management plans discussed with the patient and  in agreement.  CODE STATUS: Full code   TOTAL TIME TAKING CARE OF THIS PATIENT: 50 minutes.    Azucena Freed N M.D on 12/22/2014 at 5:58 AM  Between 7am to 6pm - Pager - [phone]  After 6pm go to [email or web address] - password [email or web address]  Post Hospitalists  Office  [phone]  CC: Primary care physician; Rusty Aus., MD

## 2014-12-22 NOTE — Progress Notes (Signed)
Per Dr. Ola Spurr, order patient mucinex 600mg  tabs Q12h PRN for cough.

## 2014-12-22 NOTE — Progress Notes (Addendum)
Date: 12/22/2014,   MRN# [id number] Andres Gutierrez [birth year] Code Status:     Code Status Orders        Start     Ordered   12/22/14 0224  Full code   Continuous     12/22/14 0223     Hosp day:@LENGTHOFSTAYDAYS @ Referring MD: @ATDPROV @         AdmissionWeight: 220 lb (99.791 kg)                 CurrentWeight: 226 lb (102.513 kg)   CC: Abnormal chest ct scan   HPI: This is an 81 year old male, came in with chest paid,  on work up noted to have rapid flutter, chest ct showed 1.4 cm right hilar nodes as well as subcarinal and azygoesphageal nodes. Small right pleural effusion, right calcified, soft, basal density, more prominent compared to 2011. Pulmonary asked to see. He denies asbestos exposure, chest trauma, worked in [employer], no known exposure of berrylium. No tb exposure. Did grow up around chickens.    PMHX:   Past Medical History  Diagnosis Date  . Hypertension   . MI (myocardial infarction)     x 2   Surgical Hx:  Past Surgical History  Procedure Laterality Date  . Coronary artery bypass graft    . Coronary angioplasty with stent placement     Family Hx:  Family History  Problem Relation Age of Onset  . CAD Mother   . Colon cancer Father    Social Hx:   Social History  Substance Use Topics  . Smoking status: Former Research scientist (life sciences)  . Smokeless tobacco: None  . Alcohol Use: Yes   Medication:    Home Medication:  No current outpatient prescriptions on file.  Current Medication: @CURMEDTAB @   Allergies:  Review of patient's allergies indicates no known allergies.  Review of Systems: Gen:  Denies  fever, sweats, chills HEENT: Denies blurred vision, double vision, ear pain, eye pain, hearing loss, nose bleeds, sore throat Cvc:  No dizziness, chest pain or heaviness Resp:   No sob, occasional wheeze, no plerisy Gi: Denies swallowing difficulty, stomach pain, nausea or vomiting, diarrhea, constipation, bowel incontinence Gu:  Denies bladder incontinence,  burning urine Ext:   No Joint pain, stiffness or swelling Skin: No skin rash, easy bruising or bleeding or hives Endoc:  No polyuria, polydipsia , polyphagia or weight change Psych: No depression, insomnia or hallucinations  Other:  All other systems negative  Physical Examination:   VS: BP 90/66 mmHg  Pulse 89  Temp(Src) 98.3 F (36.8 C) (Oral)  Resp 18  Ht 6\' 4"  (1.93 m)  Wt 226 lb (102.513 kg)  BMI 27.52 kg/m2  SpO2 94%  General Appearance: No distress, laying flat in bed  Neuro: without focal findings, mental status, speech normal, alert and oriented, cranial nerves 2-12 intact, reflexes normal and symmetric, sensation grossly normal  HEENT: PERRLA, EOM intact, no ptosis, no other lesions noticed, Mallampati: Pulmonary:.No wheezing, No rales  Sputum Production:   Cardiovascular:  Normal S1,S2.  No m/r/g.  Abdominal aorta pulsation normal.    Abdomen:Benign, Soft, non-tender, No masses, hepatosplenomegaly, No lymphadenopathy Endoc: No evident thyromegaly, no signs of acromegaly or Cushing features Skin:   warm, no rashes, no ecchymosis  Extremities: normal, no cyanosis, clubbing, no edema, warm with normal capillary refill. Other findings:   Labs results:   Recent Labs     12/21/14  2047  12/22/14  0417  HGB  12.8*   --  HCT  38.5*   --   MCV  96.5   --   WBC  7.1   --   BUN  31*  27*  CREATININE  1.52*  1.40*  GLUCOSE  108*  103*  CALCIUM  9.3  8.5*  INR  1.05   --   ,    Rad results:  CLINICAL DATA: Acute onset of left-sided chest pain and mid abdominal pain. Shortness of breath. Initial encounter.  EXAM: CT ANGIOGRAPHY CHEST  CT ABDOMEN AND PELVIS WITH CONTRAST  TECHNIQUE: Multidetector CT imaging of the chest was performed using the standard protocol during bolus administration of intravenous contrast. Multiplanar CT image reconstructions and MIPs were obtained to evaluate the vascular anatomy. Multidetector CT imaging of the abdomen and pelvis  was performed using the standard protocol during bolus administration of intravenous contrast.  CONTRAST: 168mL OMNIPAQUE IOHEXOL 350 MG/ML SOLN  COMPARISON: CT of the abdomen and pelvis performed 02/13/2011, and chest radiograph performed earlier today at 8:53 p.m.  FINDINGS: CTA CHEST FINDINGS  There is no evidence of pulmonary embolus.  Trace right-sided pleural fluid is noted, mildly loculated in appearance. Vague pleural and paraspinal soft tissue density is noted along the medial right lung base, with scattered pleural calcification. This is more prominent than in 2011, though the chronicity suggests against malignancy.  Scarring and atelectasis are noted at the right lung base. A small bulla is noted at the left lung apex. There is no evidence of pneumothorax.  Enlarged right hilar nodes are seen, measuring up to 1.4 cm in short axis. Enlarged subcarinal and azygoesophageal recess nodes are noted, measuring up to 1.4 cm in short axis. There is a 1.2 cm peribronchial node at the right lower lobe.  Diffuse coronary artery calcifications are seen. No pericardial effusion is identified. Scattered calcification is noted along the proximal great vessels. The patient is status post median sternotomy. No axillary lymphadenopathy is seen. The visualized portions of the thyroid gland are unremarkable in appearance.  No acute osseous abnormalities are seen.  CT ABDOMEN and PELVIS FINDINGS  The liver and spleen are unremarkable in appearance. The gallbladder is within normal limits. The pancreas and adrenal glands are unremarkable.  Bilateral renal cysts are seen, measuring up to 4.3 cm in size. Nonspecific perinephric stranding is noted bilaterally. There is no evidence of hydronephrosis. Small bilateral renal stones measure up to 4 mm in size. There is no evidence of hydronephrosis. No obstructing ureteral stones are seen.  No free fluid is identified. The  small bowel is unremarkable in appearance. The stomach is within normal limits. No acute vascular abnormalities are seen. Scattered calcification is noted along the abdominal aorta and its branches.  The patient is status post appendectomy. The colon is unremarkable in appearance.  The bladder is mildly distended and grossly unremarkable. The prostate is enlarged, measuring 5.4 cm in transverse dimension. No inguinal lymphadenopathy is seen.  No acute osseous abnormalities are identified. There is grade 1 anterolisthesis of L4 on L5, reflecting underlying facet disease. Vacuum phenomenon and disc space narrowing are seen at L5-S1.  Review of the MIP images confirms the above findings.  IMPRESSION: 1. Enlarged right hilar, right peribronchial and mediastinal nodes, measuring up to 1.4 cm in short axis. Underlying malignancy cannot be excluded. These would be amenable to transbronchial biopsy, as deemed clinically appropriate. 2. Trace right-sided pleural fluid, mildly loculated in appearance. No definite evidence of right-sided malignancy. Scarring and atelectasis at the right lung base. 3. Pleural  calcification and medial right basilar pleural soft tissue is more prominent than in 2011, though the chronicity suggests against malignancy. Would correlate for evidence of prior asbestos exposure. 4. Small bulla at the left lung apex. 5. Diffuse coronary artery calcifications seen. 6. Bilateral renal cysts seen. Small bilateral nonobstructing renal stones measure up to 4 mm in size. 7. Scattered calcification along the abdominal aorta and its branches. 8. Enlarged prostate noted.    Assessment and Plan: Right hilar, subcarinal, azygoesophageal nodes. ? Etiology. Doubt this is the cause for his chest pain. ? Age -check ace level, anca, sed rate -patient want to wait on EBUS/ TBB -repeat chest ct scan in 3 months  Trace right pleural effusion, to small to  tap -following  Pleural calcifications, more prominent soft tissue density, he denies known asbestos exposure ? Vats (patient refusing) Repeat chest ct in 3 months  I have personally obtained a history, examined the patient, evaluated laboratory and imaging results, formulated the assessment and plan and placed orders.  The Patient requires high complexity decision making for assessment and support, frequent evaluation and titration of therapies, application of advanced monitoring technologies and extensive interpretation of multiple databases.   Herbon Fleming,M.D. Pulmonary & Critical care Medicine [facility]

## 2014-12-22 NOTE — Progress Notes (Addendum)
ANTICOAGULATION CONSULT NOTE - Initial Consult  Pharmacy Consult for heparin drip Indication: atrial fibrillation  No Known Allergies  Patient Measurements: Height: 6\' 4"  (193 cm) Weight: 226 lb (102.513 kg) IBW/kg (Calculated) : 86.8 Heparin Dosing Weight: 102kg  Vital Signs: Temp: 98.2 F (36.8 C) (08/10 0225) Temp Source: Oral (08/10 0225) BP: 116/59 mmHg (08/10 0225) Pulse Rate: 87 (08/10 0225)  Labs:  Recent Labs  12/21/14 2047  HGB 12.8*  HCT 38.5*  PLT 151  LABPROT 13.9  INR 1.05  CREATININE 1.52*  TROPONINI <0.03    Estimated Creatinine Clearance: 46.8 mL/min (by C-G formula based on Cr of 1.52).   Medical History: Past Medical History  Diagnosis Date  . Hypertension   . MI (myocardial infarction)     x 2    Medications:    Assessment: hgb 12.8  plt 151 aPTT 37   INR 1.05  Goal of Therapy:  Heparin level 0.3-0.7 units/ml Monitor platelets by anticoagulation protocol: Yes   Plan:  6000 unit bolus and initial rate of 1500 units/hr. First anti-Xa 8 hours after start of infusion.   Sim Boast, PharmD, BCPS  12/22/2014

## 2014-12-22 NOTE — Progress Notes (Signed)
Andres Gutierrez is a 81 y.o. male   SUBJECTIVE:  Patient admitted this morning with chest discomfort and rapid a flutter. IV Cardizem brought the heart rate down. He currently has no chest pain. CT of the chest did show a large right hilum with small right pleural effusion.  ______________________________________________________________________  ROS: Review of systems is unremarkable for any active cardiac,respiratory, GI, GU, hematologic, neurologic or psychiatric systems, 10 systems reviewed.  Marland Kitchen aspirin EC  81 mg Oral Daily  . atorvastatin  20 mg Oral Daily  . carvedilol  6.25 mg Oral BID WC  . irbesartan  75 mg Oral Daily  . pantoprazole  40 mg Oral Daily  . potassium chloride  10 mEq Oral BID  . sodium chloride  3 mL Intravenous Q12H  . sucralfate  1 g Oral QID  . tamsulosin  0.4 mg Oral Daily   acetaminophen **OR** acetaminophen, ondansetron **OR** ondansetron (ZOFRAN) IV   Past Medical History  Diagnosis Date  . Hypertension   . MI (myocardial infarction)     x 2    Past Surgical History  Procedure Laterality Date  . Coronary artery bypass graft    . Coronary angioplasty with stent placement      PHYSICAL EXAM:  BP 116/59 mmHg  Pulse 87  Temp(Src) 98.2 F (36.8 C) (Oral)  Resp 18  Ht 6\' 4"  (1.93 m)  Wt 102.513 kg (226 lb)  BMI 27.52 kg/m2  SpO2 95%  Wt Readings from Last 3 Encounters:  12/22/14 102.513 kg (226 lb)           BP Readings from Last 3 Encounters:  12/22/14 116/59    Constitutional: NAD Neck: supple, no thyromegaly Respiratory: CTA, no rales or wheezes Cardiovascular: RRR, no murmur, no gallop Abdomen: soft, good BS, nontender Extremities: no edema Neuro: alert and oriented, no focal motor or sensory deficits  ASSESSMENT/PLAN:  Labs and imaging studies were reviewed  Chest discomfort-troponins negative, cardiology thoughts. Pain-free currently A flutter-telemetry still shows A. fib/flutter, rate controlled, echo pending, decide on  anticoagulation Right hilar lymph nodes-pulmonary consult pending, somewhat worrisome, ultimately will need biopsy CAD-no MI Likely home tomorrow morning

## 2014-12-22 NOTE — Consult Note (Signed)
[facility] Consultation Note  Patient ID: Andres Gutierrez, MRN: [id number], DOB/AGE: 06/15/1930 81 y.o. Admit date: 12/21/2014   Date of Consult: 12/22/2014 Primary Physician: Rusty Aus., MD Primary Cardiologist: F ATH  Chief Complaint:  Chief Complaint  Patient presents with  . Chest Pain  . Abdominal Pain   Reason for Consult: atrial flutter with rapid ventricular rate  HPI: 81 y.o. male with the known central hypertension coronary artery disease status post coronary artery bypass graft with previous stenting mixed hyperlipidemia old myocardial infarction with acute onset of atrial flutter nonvalvular and paroxysmal in nature with rapid ventricular rate. The patient has not had any myocardial infarction with an EKG showing atrial flutter with rapid vague non-specific ST changes and a normal troponin. The patient does have chronic systolic dysfunction congestive heart failure with ejection fraction of 35% with no apparent exacerbation at this time. He did have some chest discomfort with rapid heart rate but this is completely resolved at this time over the last 12 hours. The patient now feels much better at this time with no further evidence of significant G changes and better heart rate control with carvedilol and diltiazem  Past Medical History  Diagnosis Date  . Hypertension   . MI (myocardial infarction)     x 2      Surgical History:  Past Surgical History  Procedure Laterality Date  . Coronary artery bypass graft    . Coronary angioplasty with stent placement       Home Meds: Prior to Admission medications   Medication Sig Start Date End Date Taking? Authorizing Provider  aspirin EC 81 MG tablet Take 81 mg by mouth daily.   Yes Historical Provider, MD  atorvastatin (LIPITOR) 20 MG tablet Take 20 mg by mouth daily.   Yes Historical Provider, MD  carvedilol (COREG) 6.25 MG tablet Take 6.25 mg by mouth 2 (two) times daily with a meal.   Yes Historical Provider,  MD  ciprofloxacin (CIPRO) 500 MG tablet Take 500 mg by mouth 2 (two) times daily. Just picked up today- has not started.   Yes Historical Provider, MD  pantoprazole (PROTONIX) 40 MG tablet Take 40 mg by mouth daily.   Yes Historical Provider, MD  potassium chloride (K-DUR) 10 MEQ tablet Take 10 mEq by mouth 2 (two) times daily.   Yes Historical Provider, MD  sucralfate (CARAFATE) 1 G tablet Take 1 g by mouth 4 (four) times daily.   Yes Historical Provider, MD  tamsulosin (FLOMAX) 0.4 MG CAPS capsule Take 0.4 mg by mouth daily.   Yes Historical Provider, MD  telmisartan (MICARDIS) 80 MG tablet Take 80 mg by mouth daily.   Yes Historical Provider, MD    Inpatient Medications:  . aspirin EC  81 mg Oral Daily  . atorvastatin  20 mg Oral Daily  . carvedilol  6.25 mg Oral BID WC  . diltiazem  180 mg Oral Daily  . irbesartan  75 mg Oral Daily  . pantoprazole  40 mg Oral Daily  . potassium chloride  10 mEq Oral BID  . sodium chloride  3 mL Intravenous Q12H  . sucralfate  1 g Oral QID  . tamsulosin  0.4 mg Oral Daily   . sodium chloride 50 mL/hr at 12/22/14 0512  . heparin 1,500 Units/hr (12/22/14 0512)    Allergies: No Known Allergies  Social History   Social History  . Marital Status: Married    Spouse Name: N/A  . Number of Children:  N/A  . Years of Education: N/A   Occupational History  . Not on file.   Social History Main Topics  . Smoking status: Former Research scientist (life sciences)  . Smokeless tobacco: Not on file  . Alcohol Use: Yes  . Drug Use: Not on file  . Sexual Activity: Not on file   Other Topics Concern  . Not on file   Social History Narrative     Family History  Problem Relation Age of Onset  . CAD Mother   . Colon cancer Father      Review of Systems Positive for chest discomfort and tachycardia with palpitations Negative for: General:  chills, fever, night sweats or weight changes.  Cardiovascular: PND orthopnea syncope dizziness  Dermatological skin lesions  rashes Respiratory: Cough congestion Urologic: Frequent urination urination at night and hematuria Abdominal: negative for nausea, vomiting, diarrhea, bright red blood per rectum, melena, or hematemesis Neurologic: negative for visual changes, and/or hearing changes  All other systems reviewed and are otherwise negative except as noted above.  Labs:  Recent Labs  12/21/14 2047 12/22/14 0411 12/22/14 1016  TROPONINI <0.03 <0.03 0.03   Lab Results  Component Value Date   WBC 7.1 12/21/2014   HGB 12.8* 12/21/2014   HCT 38.5* 12/21/2014   MCV 96.5 12/21/2014   PLT 151 12/21/2014    Recent Labs Lab 12/21/14 2047 12/22/14 0417  NA 135 136  K 4.8 4.7  CL 105 106  CO2 21* 24  BUN 31* 27*  CREATININE 1.52* 1.40*  CALCIUM 9.3 8.5*  PROT 8.0  --   BILITOT 0.8  --   ALKPHOS 65  --   ALT 17  --   AST 23  --   GLUCOSE 108* 103*   Lab Results  Component Value Date   CHOL  09/11/2010    84        ATP III CLASSIFICATION:  <200     mg/dL   Desirable  200-239  mg/dL   Borderline High  >=240    mg/dL   High          HDL 15* 09/11/2010   LDLCALC  09/11/2010    53        Total Cholesterol/HDL:CHD Risk Coronary Heart Disease Risk Table                     Men   Women  1/2 Average Risk   3.4   3.3  Average Risk       5.0   4.4  2 X Average Risk   9.6   7.1  3 X Average Risk  23.4   11.0        Use the calculated Patient Ratio above and the CHD Risk Table to determine the patient's CHD Risk.        ATP III CLASSIFICATION (LDL):  <100     mg/dL   Optimal  100-129  mg/dL   Near or Above                    Optimal  130-159  mg/dL   Borderline  160-189  mg/dL   High  >190     mg/dL   Very High   TRIG 78 09/11/2010   Lab Results  Component Value Date   DDIMER * 09/10/2010    1.46        AT THE INHOUSE ESTABLISHED CUTOFF VALUE OF 0.48 ug/mL FEU, THIS ASSAY HAS BEEN DOCUMENTED IN  THE LITERATURE TO HAVE A SENSITIVITY AND NEGATIVE PREDICTIVE VALUE OF AT LEAST 98 TO  99%.  THE TEST RESULT SHOULD BE CORRELATED WITH AN ASSESSMENT OF THE CLINICAL PROBABILITY OF DVT / VTE.    Radiology/Studies:  Ct Angio Chest Pe W/cm &/or Wo Cm  12/21/2014   CLINICAL DATA:  Acute onset of left-sided chest pain and mid abdominal pain. Shortness of breath. Initial encounter.  EXAM: CT ANGIOGRAPHY CHEST  CT ABDOMEN AND PELVIS WITH CONTRAST  TECHNIQUE: Multidetector CT imaging of the chest was performed using the standard protocol during bolus administration of intravenous contrast. Multiplanar CT image reconstructions and MIPs were obtained to evaluate the vascular anatomy. Multidetector CT imaging of the abdomen and pelvis was performed using the standard protocol during bolus administration of intravenous contrast.  CONTRAST:  125mL OMNIPAQUE IOHEXOL 350 MG/ML SOLN  COMPARISON:  CT of the abdomen and pelvis performed 02/13/2011, and chest radiograph performed earlier today at 8:53 p.m.  FINDINGS: CTA CHEST FINDINGS  There is no evidence of pulmonary embolus.  Trace right-sided pleural fluid is noted, mildly loculated in appearance. Vague pleural and paraspinal soft tissue density is noted along the medial right lung base, with scattered pleural calcification. This is more prominent than in 2011, though the chronicity suggests against malignancy.  Scarring and atelectasis are noted at the right lung base. A small bulla is noted at the left lung apex. There is no evidence of pneumothorax.  Enlarged right hilar nodes are seen, measuring up to 1.4 cm in short axis. Enlarged subcarinal and azygoesophageal recess nodes are noted, measuring up to 1.4 cm in short axis. There is a 1.2 cm peribronchial node at the right lower lobe.  Diffuse coronary artery calcifications are seen. No pericardial effusion is identified. Scattered calcification is noted along the proximal great vessels. The patient is status post median sternotomy. No axillary lymphadenopathy is seen. The visualized portions of the  thyroid gland are unremarkable in appearance.  No acute osseous abnormalities are seen.  CT ABDOMEN and PELVIS FINDINGS  The liver and spleen are unremarkable in appearance. The gallbladder is within normal limits. The pancreas and adrenal glands are unremarkable.  Bilateral renal cysts are seen, measuring up to 4.3 cm in size. Nonspecific perinephric stranding is noted bilaterally. There is no evidence of hydronephrosis. Small bilateral renal stones measure up to 4 mm in size. There is no evidence of hydronephrosis. No obstructing ureteral stones are seen.  No free fluid is identified. The small bowel is unremarkable in appearance. The stomach is within normal limits. No acute vascular abnormalities are seen. Scattered calcification is noted along the abdominal aorta and its branches.  The patient is status post appendectomy. The colon is unremarkable in appearance.  The bladder is mildly distended and grossly unremarkable. The prostate is enlarged, measuring 5.4 cm in transverse dimension. No inguinal lymphadenopathy is seen.  No acute osseous abnormalities are identified. There is grade 1 anterolisthesis of L4 on L5, reflecting underlying facet disease. Vacuum phenomenon and disc space narrowing are seen at L5-S1.  Review of the MIP images confirms the above findings.  IMPRESSION: 1. Enlarged right hilar, right peribronchial and mediastinal nodes, measuring up to 1.4 cm in short axis. Underlying malignancy cannot be excluded. These would be amenable to transbronchial biopsy, as deemed clinically appropriate. 2. Trace right-sided pleural fluid, mildly loculated in appearance. No definite evidence of right-sided malignancy. Scarring and atelectasis at the right lung base. 3. Pleural calcification and medial right basilar pleural soft tissue is  more prominent than in 2011, though the chronicity suggests against malignancy. Would correlate for evidence of prior asbestos exposure. 4. Small bulla at the left lung apex.  5. Diffuse coronary artery calcifications seen. 6. Bilateral renal cysts seen. Small bilateral nonobstructing renal stones measure up to 4 mm in size. 7. Scattered calcification along the abdominal aorta and its branches. 8. Enlarged prostate noted.   Electronically Signed   By: Garald Balding M.D.   On: 12/21/2014 22:24   Ct Abdomen Pelvis W Contrast  12/21/2014   CLINICAL DATA:  Acute onset of left-sided chest pain and mid abdominal pain. Shortness of breath. Initial encounter.  EXAM: CT ANGIOGRAPHY CHEST  CT ABDOMEN AND PELVIS WITH CONTRAST  TECHNIQUE: Multidetector CT imaging of the chest was performed using the standard protocol during bolus administration of intravenous contrast. Multiplanar CT image reconstructions and MIPs were obtained to evaluate the vascular anatomy. Multidetector CT imaging of the abdomen and pelvis was performed using the standard protocol during bolus administration of intravenous contrast.  CONTRAST:  183mL OMNIPAQUE IOHEXOL 350 MG/ML SOLN  COMPARISON:  CT of the abdomen and pelvis performed 02/13/2011, and chest radiograph performed earlier today at 8:53 p.m.  FINDINGS: CTA CHEST FINDINGS  There is no evidence of pulmonary embolus.  Trace right-sided pleural fluid is noted, mildly loculated in appearance. Vague pleural and paraspinal soft tissue density is noted along the medial right lung base, with scattered pleural calcification. This is more prominent than in 2011, though the chronicity suggests against malignancy.  Scarring and atelectasis are noted at the right lung base. A small bulla is noted at the left lung apex. There is no evidence of pneumothorax.  Enlarged right hilar nodes are seen, measuring up to 1.4 cm in short axis. Enlarged subcarinal and azygoesophageal recess nodes are noted, measuring up to 1.4 cm in short axis. There is a 1.2 cm peribronchial node at the right lower lobe.  Diffuse coronary artery calcifications are seen. No pericardial effusion is  identified. Scattered calcification is noted along the proximal great vessels. The patient is status post median sternotomy. No axillary lymphadenopathy is seen. The visualized portions of the thyroid gland are unremarkable in appearance.  No acute osseous abnormalities are seen.  CT ABDOMEN and PELVIS FINDINGS  The liver and spleen are unremarkable in appearance. The gallbladder is within normal limits. The pancreas and adrenal glands are unremarkable.  Bilateral renal cysts are seen, measuring up to 4.3 cm in size. Nonspecific perinephric stranding is noted bilaterally. There is no evidence of hydronephrosis. Small bilateral renal stones measure up to 4 mm in size. There is no evidence of hydronephrosis. No obstructing ureteral stones are seen.  No free fluid is identified. The small bowel is unremarkable in appearance. The stomach is within normal limits. No acute vascular abnormalities are seen. Scattered calcification is noted along the abdominal aorta and its branches.  The patient is status post appendectomy. The colon is unremarkable in appearance.  The bladder is mildly distended and grossly unremarkable. The prostate is enlarged, measuring 5.4 cm in transverse dimension. No inguinal lymphadenopathy is seen.  No acute osseous abnormalities are identified. There is grade 1 anterolisthesis of L4 on L5, reflecting underlying facet disease. Vacuum phenomenon and disc space narrowing are seen at L5-S1.  Review of the MIP images confirms the above findings.  IMPRESSION: 1. Enlarged right hilar, right peribronchial and mediastinal nodes, measuring up to 1.4 cm in short axis. Underlying malignancy cannot be excluded. These would be amenable to  transbronchial biopsy, as deemed clinically appropriate. 2. Trace right-sided pleural fluid, mildly loculated in appearance. No definite evidence of right-sided malignancy. Scarring and atelectasis at the right lung base. 3. Pleural calcification and medial right basilar  pleural soft tissue is more prominent than in 2011, though the chronicity suggests against malignancy. Would correlate for evidence of prior asbestos exposure. 4. Small bulla at the left lung apex. 5. Diffuse coronary artery calcifications seen. 6. Bilateral renal cysts seen. Small bilateral nonobstructing renal stones measure up to 4 mm in size. 7. Scattered calcification along the abdominal aorta and its branches. 8. Enlarged prostate noted.   Electronically Signed   By: Garald Balding M.D.   On: 12/21/2014 22:24   Dg Chest Port 1 View  12/21/2014   CLINICAL DATA:  Abdominal pain.  Initial encounter.  EXAM: PORTABLE CHEST - 1 VIEW  COMPARISON:  04/21/2011.  FINDINGS: Cardiopericardial silhouette is enlarged. CABG. Monitoring leads project over the chest. No airspace disease. No effusion.  IMPRESSION: Cardiomegaly without failure.   Electronically Signed   By: Dereck Ligas M.D.   On: 12/21/2014 21:07    EKG: Atrial flutter with rapid ventricular rate and nonspecific ST and T-wave changes  Weights: Filed Weights   12/21/14 2005 12/22/14 0225  Weight: 220 lb (99.791 kg) 226 lb (102.513 kg)     Physical Exam: Blood pressure 90/66, pulse 89, temperature 98.3 F (36.8 C), temperature source Oral, resp. rate 18, height 6\' 4"  (1.93 m), weight 226 lb (102.513 kg), SpO2 94 %. Body mass index is 27.52 kg/(m^2). General: Well developed, well nourished, in no acute distress. Head eyes ears nose throat: Normocephalic, atraumatic, sclera non-icteric, no xanthomas, nares are without discharge. No apparent thyromegaly and/or mass  Lungs: Normal respiratory effort.  no wheezes, no rales, no rhonchi.  Heart: Irregular with normal S1 S2. 2+ MR murmur gallop, no rub, PMI is normal size and placement, carotid upstroke normal without bruit, jugular venous pressure is normal Abdomen: Soft, non-tender, non-distended with normoactive bowel sounds. No hepatomegaly. No rebound/guarding. No obvious abdominal masses.  Abdominal aorta is normal size without bruit Extremities: Trace edema. no cyanosis, no clubbing, no ulcers  Peripheral : 2+ bilateral upper extremity pulses, 2+ bilateral femoral pulses, 2+ bilateral dorsal pedal pulse Neuro: Alert and oriented. No facial asymmetry. No focal deficit. Moves all extremities spontaneously. Musculoskeletal: Normal muscle tone without kyphosis Psych:  Responds to questions appropriately with a normal affect.    Assessment: 81 year old male with known essential hypertension coronary artery disease status post coronary bypass graft mixed hyperlipidemia old myocardial infarction chronic systolic dysfunction congestive heart failure without exacerbation and or myocardial infarction with atrial flutter nonvalvular and paroxysmal in nature with rapid rate now better controlled  Plan: 1. Continue carvedilol for heart rate control and cardiomyopathy 2. Add long-acting diltiazem for heart rate control and possible spontaneous conversion to normal sinus rhythm 3. Heparin for further risk reduction of stroke with atrial flutter with spontaneous conversion 4. Echocardiogram for LV systolic dysfunction valvular heart disease contributing to above with further adjustments of medications 5. Possible oral anticoagulation depending on above likely using Eliquis 5 mg twice per day 6. High intensity cholesterol therapy for coronary artery disease 7. No further cardiac intervention with no evidence of heart failure or myocardial infarction with symptoms of above  Signed, Corey Skains M.D. [facility] 12/22/2014, 12:54 PM

## 2014-12-22 NOTE — Care Management (Signed)
Patient presents with chest pain and a flutter.  Patient states that he lives alone, however has the support of his family.  Patient uses [facility] in [place] to obtain his medication.  Patient states that he does not have any home equipment and does not feel he needs anything.  Patient states that he has transportation to any follow up appointments.  Plan for patient to discharge to home.  No further needs anticipated.

## 2014-12-22 NOTE — Progress Notes (Signed)
ANTICOAGULATION CONSULT NOTE - Follow Up  Pharmacy Consult for heparin drip Indication: atrial fibrillation  No Known Allergies  Patient Measurements: Height: 6\' 4"  (193 cm) Weight: 226 lb (102.513 kg) IBW/kg (Calculated) : 86.8 Heparin Dosing Weight: 99.8 kg  Vital Signs: Temp: 98.3 F (36.8 C) (08/10 1110) Temp Source: Oral (08/10 1110) BP: 90/66 mmHg (08/10 1110) Pulse Rate: 89 (08/10 1110)  Labs:  Recent Labs  12/21/14 2047 12/22/14 0411 12/22/14 0417 12/22/14 1016 12/22/14 1257  HGB 12.8*  --   --   --   --   HCT 38.5*  --   --   --   --   PLT 151  --   --   --   --   APTT  --  37*  --   --   --   LABPROT 13.9  --   --   --   --   INR 1.05  --   --   --   --   HEPARINUNFRC  --   --   --   --  1.01*  CREATININE 1.52*  --  1.40*  --   --   TROPONINI <0.03 <0.03  --  0.03 0.03    Estimated Creatinine Clearance: 50.8 mL/min (by C-G formula based on Cr of 1.4).   Medical History: Past Medical History  Diagnosis Date  . Hypertension   . MI (myocardial infarction)     x 2    Medications:  Heparin drip at 1500 units/hr   Assessment: Patient is an 81 yo male receiving Heparin as anticoagulation for atrial fibrillation.    HL at 1257: 1.01 Hgb: 12.8, plts: 151  Goal of Therapy:  Heparin level 0.3-0.7 units/ml Monitor platelets by anticoagulation protocol: Yes   Plan:  HL supratherapeutic at 1.01.  Instructed RN to hold heparin drip for 1 hour.  Will restart in 1 hour at rate of 1200 units/hr.  Will recheck HL in 8 hours at 2230.  CBC ordered in AM.   Pharmacy will continue to follow.  Murrell Converse, PharmD Clinical Pharmacist 12/22/2014

## 2014-12-23 LAB — BASIC METABOLIC PANEL
ANION GAP: 11 (ref 5–15)
BUN: 23 mg/dL — ABNORMAL HIGH (ref 6–20)
CO2: 19 mmol/L — ABNORMAL LOW (ref 22–32)
Calcium: 8.3 mg/dL — ABNORMAL LOW (ref 8.9–10.3)
Chloride: 104 mmol/L (ref 101–111)
Creatinine, Ser: 1.31 mg/dL — ABNORMAL HIGH (ref 0.61–1.24)
GFR, EST AFRICAN AMERICAN: 57 mL/min — AB (ref >60)
GFR, EST NON AFRICAN AMERICAN: 49 mL/min — AB (ref >60)
Glucose, Bld: 136 mg/dL — ABNORMAL HIGH (ref 65–99)
POTASSIUM: 3.9 mmol/L (ref 3.5–5.1)
Sodium: 134 mmol/L — ABNORMAL LOW (ref 135–145)

## 2014-12-23 LAB — CBC WITH DIFFERENTIAL/PLATELET
BASOS PCT: 1 %
Basophils Absolute: 0 10*3/uL (ref 0–0.1)
Eosinophils Absolute: 0.1 10*3/uL (ref 0–0.7)
Eosinophils Relative: 2 %
HEMATOCRIT: 34.9 % — AB (ref 40.0–52.0)
HEMOGLOBIN: 11.7 g/dL — AB (ref 13.0–18.0)
LYMPHS PCT: 29 %
Lymphs Abs: 1.4 10*3/uL (ref 1.0–3.6)
MCH: 32.6 pg (ref 26.0–34.0)
MCHC: 33.6 g/dL (ref 32.0–36.0)
MCV: 97.1 fL (ref 80.0–100.0)
MONO ABS: 1.8 10*3/uL — AB (ref 0.2–1.0)
MONOS PCT: 36 %
Neutro Abs: 1.6 10*3/uL (ref 1.4–6.5)
Neutrophils Relative %: 32 %
Platelets: 114 10*3/uL — ABNORMAL LOW (ref 150–440)
RBC: 3.59 MIL/uL — ABNORMAL LOW (ref 4.40–5.90)
RDW: 15.4 % — AB (ref 11.5–14.5)
WBC: 4.9 10*3/uL (ref 3.8–10.6)

## 2014-12-23 LAB — PLATELET COUNT: Platelets: 120 10*3/uL — ABNORMAL LOW (ref 150–440)

## 2014-12-23 LAB — HEPARIN LEVEL (UNFRACTIONATED)
Heparin Unfractionated: 0.83 IU/mL — ABNORMAL HIGH (ref 0.30–0.70)
Heparin Unfractionated: 0.67 IU/mL (ref 0.30–0.70)

## 2014-12-23 LAB — TROPONIN I: Troponin I: <0.03 ng/mL (ref <0.031)

## 2014-12-23 MED ORDER — APIXABAN 5 MG PO TABS: 5.0000 mg | ORAL_TABLET | Freq: Two times a day (BID) | ORAL | Status: DC

## 2014-12-23 MED ORDER — APIXABAN 5 MG PO TABS
5.0000 mg | ORAL_TABLET | Freq: Two times a day (BID) | ORAL | Status: DC
  Administered 2014-12-24 (×2): 5 mg via ORAL
  Filled 2014-12-23 (×2): qty 1

## 2014-12-23 MED ORDER — SODIUM CHLORIDE 0.9 % IJ SOLN: 3.0000 mL | INTRAMUSCULAR | Status: DC | PRN

## 2014-12-23 MED ORDER — HEPARIN (PORCINE) IN NACL 100-0.45 UNIT/ML-% IJ SOLN
1000.0000 [IU]/h | INTRAMUSCULAR | Status: DC
  Administered 2014-12-23 (×2): 1000 [IU]/h via INTRAVENOUS
  Filled 2014-12-23 (×2): qty 250

## 2014-12-23 MED ORDER — DILTIAZEM HCL ER COATED BEADS 180 MG PO CP24: 180.0000 mg | ORAL_CAPSULE | Freq: Every day | ORAL | Status: DC

## 2014-12-23 MED ORDER — CIPROFLOXACIN HCL 500 MG PO TABS
500.0000 mg | ORAL_TABLET | Freq: Two times a day (BID) | ORAL | Status: DC
  Administered 2014-12-23 – 2014-12-24 (×3): 500 mg via ORAL
  Filled 2014-12-23 (×3): qty 1

## 2014-12-23 NOTE — Progress Notes (Signed)
Patient ID: Andres Gutierrez, male   DOB: 1931-05-05, 81 y.o.   MRN: [id number] Dantwan Hosbach is a 81 y.o. male   SUBJECTIVE:  Patient admitted this morning with chest discomfort and rapid a flutter. IV Cardizem brought the heart rate down. He currently has no chest pain. CT of the chest did show a large right hilum with small right pleural effusion. Asymptomatic this morning, still in a flutter  ______________________________________________________________________  ROS: Review of systems is unremarkable for any active cardiac,respiratory, GI, GU, hematologic, neurologic or psychiatric systems, 10 systems reviewed.  Marland Kitchen aspirin EC  81 mg Oral Daily  . atorvastatin  20 mg Oral Daily  . carvedilol  6.25 mg Oral BID WC  . ciprofloxacin  500 mg Oral BID  . diltiazem  180 mg Oral Daily  . pantoprazole  40 mg Oral Daily  . potassium chloride  10 mEq Oral Daily  . sodium chloride  3 mL Intravenous Q12H  . sucralfate  1 g Oral QID  . tamsulosin  0.4 mg Oral Daily   acetaminophen **OR** acetaminophen, guaiFENesin, ondansetron **OR** ondansetron (ZOFRAN) IV   Past Medical History  Diagnosis Date  . Hypertension   . MI (myocardial infarction)     x 2    Past Surgical History  Procedure Laterality Date  . Coronary artery bypass graft    . Coronary angioplasty with stent placement      PHYSICAL EXAM:  BP 107/65 mmHg  Pulse 87  Temp(Src) 98.1 F (36.7 C) (Oral)  Resp 18  Ht 6\' 4"  (1.93 m)  Wt 100.789 kg (222 lb 3.2 oz)  BMI 27.06 kg/m2  SpO2 98%  Wt Readings from Last 3 Encounters:  12/23/14 100.789 kg (222 lb 3.2 oz)           BP Readings from Last 3 Encounters:  12/23/14 107/65    Constitutional: NAD Neck: supple, no thyromegaly Respiratory: CTA, no rales or wheezes Cardiovascular: RRR, no murmur, no gallop Abdomen: soft, good BS, nontender Extremities: no edema Neuro: alert and oriented, no focal motor or sensory deficits  ASSESSMENT/PLAN:  Labs and imaging studies  were reviewed  Chest discomfort-troponins negative, cardiology thoughts.  A flutter still a flutter, rate controlled with diltiazem, IV heparin, will start Eliquis as an outpatient, allowing biopsy to heal Right hilar lymph nodes-patient request biopsy today  CAD-no MI Discharge home depends on pulmonary workup

## 2014-12-23 NOTE — Progress Notes (Signed)
[facility] Encounter Note  Patient: Andres Gutierrez / Admit Date: 12/21/2014 / Date of Encounter: 12/23/2014, 7:53 AM   Subjective: No new cardiac symptoms overnight. Heart rate controlled with atrial flutter  Review of Systems: Positive for: Mild shortness of breath Negative for: Vision change, hearing change, syncope, dizziness, nausea, vomiting,diarrhea, bloody stool, stomach pain, cough, congestion, diaphoresis, urinary frequency, urinary pain,skin lesions, skin rashes Others previously listed  Objective: Telemetry: Atrial flutter with controlled ventricular rate Physical Exam: Blood pressure 107/65, pulse 87, temperature 98.1 F (36.7 C), temperature source Oral, resp. rate 18, height 6\' 4"  (1.93 m), weight 222 lb 3.2 oz (100.789 kg), SpO2 98 %. Body mass index is 27.06 kg/(m^2). General: Well developed, well nourished, in no acute distress. Head: Normocephalic, atraumatic, sclera non-icteric, no xanthomas, nares are without discharge. Neck: No apparent masses Lungs: Normal respirations with few wheezes, no rhonchi, no rales , no crackles   Heart: Irregular rate and rhythm, normal S1 S2, no murmur, no rub, no gallop, PMI is normal size and placement, carotid upstroke normal without bruit, jugular venous pressure normal Abdomen: Soft, non-tender, non-distended with normoactive bowel sounds. No hepatosplenomegaly. Abdominal aorta is normal size without bruit Extremities: Trace edema, no clubbing, no cyanosis, no ulcers,  Peripheral: 2+ radial, 2+ femoral, 1 + dorsal pedal pulses Neuro: Alert and oriented. Moves all extremities spontaneously. Psych:  Responds to questions appropriately with a normal affect.   Intake/Output Summary (Last 24 hours) at 12/23/14 0753 Last data filed at 12/23/14 0502  Gross per 24 hour  Intake 1082.3 ml  Output   1400 ml  Net -317.7 ml    Inpatient Medications:  . aspirin EC  81 mg Oral Daily  . atorvastatin  20 mg Oral Daily   . carvedilol  6.25 mg Oral BID WC  . ciprofloxacin  500 mg Oral BID  . diltiazem  180 mg Oral Daily  . pantoprazole  40 mg Oral Daily  . potassium chloride  10 mEq Oral Daily  . sodium chloride  3 mL Intravenous Q12H  . sucralfate  1 g Oral QID  . tamsulosin  0.4 mg Oral Daily   Infusions:  . heparin 1,200 Units/hr (12/22/14 1551)    Labs:  Recent Labs  12/22/14 0417 12/23/14 0620  NA 136 134*  K 4.7 3.9  CL 106 104  CO2 24 19*  GLUCOSE 103* 136*  BUN 27* 23*  CREATININE 1.40* 1.31*  CALCIUM 8.5* 8.3*    Recent Labs  12/21/14 2047  AST 23  ALT 17  ALKPHOS 65  BILITOT 0.8  PROT 8.0  ALBUMIN 4.5    Recent Labs  12/21/14 2047 12/23/14 0620  WBC 7.1 4.9  NEUTROABS 3.5 1.6  HGB 12.8* 11.7*  HCT 38.5* 34.9*  MCV 96.5 97.1  PLT 151 114*    Recent Labs  12/22/14 0411 12/22/14 1016 12/22/14 1257 12/23/14 0620  TROPONINI <0.03 0.03 0.03 <0.03   Invalid input(s): POCBNP No results for input(s): HGBA1C in the last 72 hours.   Weights: Filed Weights   12/21/14 2005 12/22/14 0225 12/23/14 0504  Weight: 220 lb (99.791 kg) 226 lb (102.513 kg) 222 lb 3.2 oz (100.789 kg)     Radiology/Studies:  Ct Angio Chest Pe W/cm &/or Wo Cm  12/21/2014   CLINICAL DATA:  Acute onset of left-sided chest pain and mid abdominal pain. Shortness of breath. Initial encounter.  EXAM: CT ANGIOGRAPHY CHEST  CT ABDOMEN AND PELVIS WITH CONTRAST  TECHNIQUE: Multidetector CT imaging  of the chest was performed using the standard protocol during bolus administration of intravenous contrast. Multiplanar CT image reconstructions and MIPs were obtained to evaluate the vascular anatomy. Multidetector CT imaging of the abdomen and pelvis was performed using the standard protocol during bolus administration of intravenous contrast.  CONTRAST:  171mL OMNIPAQUE IOHEXOL 350 MG/ML SOLN  COMPARISON:  CT of the abdomen and pelvis performed 02/13/2011, and chest radiograph performed earlier today at  8:53 p.m.  FINDINGS: CTA CHEST FINDINGS  There is no evidence of pulmonary embolus.  Trace right-sided pleural fluid is noted, mildly loculated in appearance. Vague pleural and paraspinal soft tissue density is noted along the medial right lung base, with scattered pleural calcification. This is more prominent than in 2011, though the chronicity suggests against malignancy.  Scarring and atelectasis are noted at the right lung base. A small bulla is noted at the left lung apex. There is no evidence of pneumothorax.  Enlarged right hilar nodes are seen, measuring up to 1.4 cm in short axis. Enlarged subcarinal and azygoesophageal recess nodes are noted, measuring up to 1.4 cm in short axis. There is a 1.2 cm peribronchial node at the right lower lobe.  Diffuse coronary artery calcifications are seen. No pericardial effusion is identified. Scattered calcification is noted along the proximal great vessels. The patient is status post median sternotomy. No axillary lymphadenopathy is seen. The visualized portions of the thyroid gland are unremarkable in appearance.  No acute osseous abnormalities are seen.  CT ABDOMEN and PELVIS FINDINGS  The liver and spleen are unremarkable in appearance. The gallbladder is within normal limits. The pancreas and adrenal glands are unremarkable.  Bilateral renal cysts are seen, measuring up to 4.3 cm in size. Nonspecific perinephric stranding is noted bilaterally. There is no evidence of hydronephrosis. Small bilateral renal stones measure up to 4 mm in size. There is no evidence of hydronephrosis. No obstructing ureteral stones are seen.  No free fluid is identified. The small bowel is unremarkable in appearance. The stomach is within normal limits. No acute vascular abnormalities are seen. Scattered calcification is noted along the abdominal aorta and its branches.  The patient is status post appendectomy. The colon is unremarkable in appearance.  The bladder is mildly distended and  grossly unremarkable. The prostate is enlarged, measuring 5.4 cm in transverse dimension. No inguinal lymphadenopathy is seen.  No acute osseous abnormalities are identified. There is grade 1 anterolisthesis of L4 on L5, reflecting underlying facet disease. Vacuum phenomenon and disc space narrowing are seen at L5-S1.  Review of the MIP images confirms the above findings.  IMPRESSION: 1. Enlarged right hilar, right peribronchial and mediastinal nodes, measuring up to 1.4 cm in short axis. Underlying malignancy cannot be excluded. These would be amenable to transbronchial biopsy, as deemed clinically appropriate. 2. Trace right-sided pleural fluid, mildly loculated in appearance. No definite evidence of right-sided malignancy. Scarring and atelectasis at the right lung base. 3. Pleural calcification and medial right basilar pleural soft tissue is more prominent than in 2011, though the chronicity suggests against malignancy. Would correlate for evidence of prior asbestos exposure. 4. Small bulla at the left lung apex. 5. Diffuse coronary artery calcifications seen. 6. Bilateral renal cysts seen. Small bilateral nonobstructing renal stones measure up to 4 mm in size. 7. Scattered calcification along the abdominal aorta and its branches. 8. Enlarged prostate noted.   Electronically Signed   By: Garald Balding M.D.   On: 12/21/2014 22:24   Ct Abdomen Pelvis W  Contrast  12/21/2014   CLINICAL DATA:  Acute onset of left-sided chest pain and mid abdominal pain. Shortness of breath. Initial encounter.  EXAM: CT ANGIOGRAPHY CHEST  CT ABDOMEN AND PELVIS WITH CONTRAST  TECHNIQUE: Multidetector CT imaging of the chest was performed using the standard protocol during bolus administration of intravenous contrast. Multiplanar CT image reconstructions and MIPs were obtained to evaluate the vascular anatomy. Multidetector CT imaging of the abdomen and pelvis was performed using the standard protocol during bolus administration of  intravenous contrast.  CONTRAST:  161mL OMNIPAQUE IOHEXOL 350 MG/ML SOLN  COMPARISON:  CT of the abdomen and pelvis performed 02/13/2011, and chest radiograph performed earlier today at 8:53 p.m.  FINDINGS: CTA CHEST FINDINGS  There is no evidence of pulmonary embolus.  Trace right-sided pleural fluid is noted, mildly loculated in appearance. Vague pleural and paraspinal soft tissue density is noted along the medial right lung base, with scattered pleural calcification. This is more prominent than in 2011, though the chronicity suggests against malignancy.  Scarring and atelectasis are noted at the right lung base. A small bulla is noted at the left lung apex. There is no evidence of pneumothorax.  Enlarged right hilar nodes are seen, measuring up to 1.4 cm in short axis. Enlarged subcarinal and azygoesophageal recess nodes are noted, measuring up to 1.4 cm in short axis. There is a 1.2 cm peribronchial node at the right lower lobe.  Diffuse coronary artery calcifications are seen. No pericardial effusion is identified. Scattered calcification is noted along the proximal great vessels. The patient is status post median sternotomy. No axillary lymphadenopathy is seen. The visualized portions of the thyroid gland are unremarkable in appearance.  No acute osseous abnormalities are seen.  CT ABDOMEN and PELVIS FINDINGS  The liver and spleen are unremarkable in appearance. The gallbladder is within normal limits. The pancreas and adrenal glands are unremarkable.  Bilateral renal cysts are seen, measuring up to 4.3 cm in size. Nonspecific perinephric stranding is noted bilaterally. There is no evidence of hydronephrosis. Small bilateral renal stones measure up to 4 mm in size. There is no evidence of hydronephrosis. No obstructing ureteral stones are seen.  No free fluid is identified. The small bowel is unremarkable in appearance. The stomach is within normal limits. No acute vascular abnormalities are seen. Scattered  calcification is noted along the abdominal aorta and its branches.  The patient is status post appendectomy. The colon is unremarkable in appearance.  The bladder is mildly distended and grossly unremarkable. The prostate is enlarged, measuring 5.4 cm in transverse dimension. No inguinal lymphadenopathy is seen.  No acute osseous abnormalities are identified. There is grade 1 anterolisthesis of L4 on L5, reflecting underlying facet disease. Vacuum phenomenon and disc space narrowing are seen at L5-S1.  Review of the MIP images confirms the above findings.  IMPRESSION: 1. Enlarged right hilar, right peribronchial and mediastinal nodes, measuring up to 1.4 cm in short axis. Underlying malignancy cannot be excluded. These would be amenable to transbronchial biopsy, as deemed clinically appropriate. 2. Trace right-sided pleural fluid, mildly loculated in appearance. No definite evidence of right-sided malignancy. Scarring and atelectasis at the right lung base. 3. Pleural calcification and medial right basilar pleural soft tissue is more prominent than in 2011, though the chronicity suggests against malignancy. Would correlate for evidence of prior asbestos exposure. 4. Small bulla at the left lung apex. 5. Diffuse coronary artery calcifications seen. 6. Bilateral renal cysts seen. Small bilateral nonobstructing renal stones measure up to  4 mm in size. 7. Scattered calcification along the abdominal aorta and its branches. 8. Enlarged prostate noted.   Electronically Signed   By: Garald Balding M.D.   On: 12/21/2014 22:24   Dg Chest Port 1 View  12/21/2014   CLINICAL DATA:  Abdominal pain.  Initial encounter.  EXAM: PORTABLE CHEST - 1 VIEW  COMPARISON:  04/21/2011.  FINDINGS: Cardiopericardial silhouette is enlarged. CABG. Monitoring leads project over the chest. No airspace disease. No effusion.  IMPRESSION: Cardiomegaly without failure.   Electronically Signed   By: Dereck Ligas M.D.   On: 12/21/2014 21:07      Assessment and Recommendation  81 y.o. male with known coronary artery disease status post coronary artery bypass graft with prior stenting essential hypertension mixed hyperlipidemia with new onset atrial flutter with rapid ventricular rate and chronic systolic dysfunction congestive heart failure stable at this time on appropriate medication management with better heart rate control but no evidence of spontaneous conversion to normal sinus rhythm at this time 1. Continue metoprolol and diltiazem combination for heart rate control of atrial flutter with possible spontaneous conversion 2. Anticoagulation with apixaban 5 mg twice per day for further risk reduction in stroke with atrial fibrillation unless contraindicated from need for procedures and/or bleeding potential 3. Begin ambulation and follow for need in adjustments of medication management 4. K for discharged home from cardiac standpoint with follow-up next week for better adjustments of medications and heart rate control  Signed, Serafina Royals M.D. FACC

## 2014-12-23 NOTE — Progress Notes (Signed)
ANTICOAGULATION CONSULT NOTE - Follow Up  Pharmacy Consult for heparin drip Indication: atrial fibrillation  No Known Allergies  Patient Measurements: Height: 6\' 4"  (193 cm) Weight: 222 lb 3.2 oz (100.789 kg) IBW/kg (Calculated) : 86.8 Heparin Dosing Weight: 100.8 kg  Vital Signs: Temp: 98.2 F (36.8 C) (08/11 1113) Temp Source: Oral (08/11 1113) BP: 119/68 mmHg (08/11 1707) Pulse Rate: 63 (08/11 1707)  Labs:  Recent Labs  12/21/14 2047 12/22/14 0411 12/22/14 0417 12/22/14 1016  12/22/14 1257 12/22/14 2258 12/23/14 0620 12/23/14 0621 12/23/14 1631  HGB 12.8*  --   --   --   --   --   --  11.7*  --   --   HCT 38.5*  --   --   --   --   --   --  34.9*  --   --   PLT 151  --   --   --   --   --   --  114*  --  120*  APTT  --  37*  --   --   --   --   --   --   --   --   LABPROT 13.9  --   --   --   --   --   --   --   --   --   INR 1.05  --   --   --   --   --   --   --   --   --   HEPARINUNFRC  --   --   --   --   < > 1.01* 0.67  --  0.83* 0.67  CREATININE 1.52*  --  1.40*  --   --   --   --  1.31*  --   --   TROPONINI <0.03 <0.03  --  0.03  --  0.03  --  <0.03  --   --   < > = values in this interval not displayed.  Estimated Creatinine Clearance: 54.3 mL/min (by C-G formula based on Cr of 1.31).   Medical History: Past Medical History  Diagnosis Date  . Hypertension   . MI (myocardial infarction)     x 2    Medications:  Heparin drip at 1200 units/hr   Assessment: Patient is an 81 yo male receiving Heparin as anticoagulation for new onset atrial fibrillation.    HL at 1630= 0.67 plts=120    Goal of Therapy:  Heparin level 0.3-0.7 units/ml Monitor platelets by anticoagulation protocol: Yes   Plan:  HL therapeutic.  Will continue current rate of 1000 units/hr and recheck level in 8 hours for confirmation.    Pharmacy will continue to follow.  Murrell Converse, PharmD Clinical Pharmacist 12/23/2014

## 2014-12-23 NOTE — Progress Notes (Signed)
Patient asking if Dr. Vella Kohler is planning to round today, patient has question about lymph node biopsy that had been mentioned to him yesterday. Per Dr. Vella Kohler, Dr. Genevive Bi will be seeing patient this afternoon.  Toniann Ket

## 2014-12-23 NOTE — Progress Notes (Signed)
ANTICOAGULATION CONSULT NOTE - Follow Up  Pharmacy Consult for heparin drip Indication: atrial fibrillation  No Known Allergies  Patient Measurements: Height: 6\' 4"  (193 cm) Weight: 222 lb 3.2 oz (100.789 kg) IBW/kg (Calculated) : 86.8 Heparin Dosing Weight: 100.8 kg  Vital Signs: Temp: 98.1 F (36.7 C) (08/11 0504) Temp Source: Oral (08/11 0504) BP: 107/65 mmHg (08/11 0504) Pulse Rate: 87 (08/11 0504)  Labs:  Recent Labs  12/21/14 2047 12/22/14 0411 12/22/14 0417 12/22/14 1016 12/22/14 1257 12/22/14 2258 12/23/14 0620 12/23/14 0621  HGB 12.8*  --   --   --   --   --  11.7*  --   HCT 38.5*  --   --   --   --   --  34.9*  --   PLT 151  --   --   --   --   --  114*  --   APTT  --  37*  --   --   --   --   --   --   LABPROT 13.9  --   --   --   --   --   --   --   INR 1.05  --   --   --   --   --   --   --   HEPARINUNFRC  --   --   --   --  1.01* 0.67  --  0.83*  CREATININE 1.52*  --  1.40*  --   --   --  1.31*  --   TROPONINI <0.03 <0.03  --  0.03 0.03  --  <0.03  --     Estimated Creatinine Clearance: 54.3 mL/min (by C-G formula based on Cr of 1.31).   Medical History: Past Medical History  Diagnosis Date  . Hypertension   . MI (myocardial infarction)     x 2    Medications:  Heparin drip at 1200 units/hr   Assessment: Patient is an 81 yo male receiving Heparin as anticoagulation for new onset atrial fibrillation.    HL at 0621 = 0.83 Hgb: 11.7, plts: 114    Goal of Therapy:  Heparin level 0.3-0.7 units/ml Monitor platelets by anticoagulation protocol: Yes   Plan:  HL supratherapeutic at 0.83. Per RN, no bleeding noted, no line issues.  Will decrease heparin drip to rate of 1000 units/hr (=10 ml/hr). Informed RN of plan. Will recheck HL in 8 hours at 1700.  Will recheck plt count also at 1700 as it is trending down.  CBC ordered in AM.   Pharmacy will continue to follow.   Rayna Sexton, PharmD, BCPS Clinical Pharmacist 12/23/2014 8:10  AM

## 2014-12-23 NOTE — Discharge Summary (Addendum)
                                                                                    Andres Gutierrez, is a 81 y.o. male  DOB 02-25-1931  MRN [id number].  Admission date:  12/21/2014  Admitting Physician  Juluis Mire, MD  Discharge Date:  12/24/2014    Admission Diagnosis  Pleural effusion [J90] Lymphadenopathy, mediastinal [R59.9] New onset atrial flutter [I48.92] Chest pain, unspecified chest pain type [R07.9] Left sided chest pain [R07.9]  Discharge Diagnoses   Atrial Flutter Acute bronchitis Hilar lymphadenopathy Hypertension CAD   Past Medical History  Diagnosis Date  . Hypertension   . MI (myocardial infarction)     x 2    Past Surgical History  Procedure Laterality Date  . Coronary artery bypass graft    . Coronary angioplasty with stent placement         History of present illness and  Hospital Course:     Kindly see H&P for history of present illness and admission details, please review complete Labs, Consult reports and Test reports for all details in brief  HPI  from the history and physical done on the day of admission    Hospital Course   Pt was admitted with rapid a fib, EF 35%. Diltiazem was added with rate control. IV heparin was transitioned to po Eliquis. Chest CT showed hilar adenopathy and effort was made to bx the patient in house but due to scheduling issues pt will f/u with pulmonary as an outpatient for a possible biopsy. Troponins were negative.   Discharge Condition: stable   Follow UP  Dr Sabra Heck 1 week   Discharge Instructions  and  Discharge Medications  Asa 81mg  daily Lipitor 20mg  qhs Carvedilol 6.25mg  bid Protonix 40mg  daily Klor con 50meq bid Sucralfate 1 gram qac, qhs Flomax 0.4mg  daily Diltiazem ER 120mg  daily Eliquis 5mg  bid    Today   Subjective:   Andres Gutierrez today had some congestion overnight but is doing better, some mild orthostatic dizziness is improved, ready to go home . Objective:   Blood pressure  97/56, pulse 63, temperature 97.3 F (36.3 C), temperature source Oral, resp. rate 18, height 6\' 4"  (1.93 m), weight 100.789 kg (222 lb 3.2 oz), SpO2 97 %.   Exam Awake Alert, Oriented x 3, No new F.N deficits, Normal affect [place].AT,PERRAL Supple Neck,No JVD, No cervical lymphadenopathy appriciated.  Symmetrical Chest wall movement, Good air movement bilaterally, CTAB RRR,No Gallops,Rubs or new Murmurs, Abd Soft, Non tender, No rebound. No Cyanosis, Clubbing or edema, No new Rash or bruise  Total Time in preparing paper work, data evaluation and todays exam - 35 minutes  Jetta Murray F. M.D on 12/24/2014 at 8am

## 2014-12-23 NOTE — Consult Note (Signed)
Case Discussed with Dr Sabra Heck and Dr. Genevive Bi  Patient CT chest reviewed.  Patient will need Pre-op clearance with Anesthesiology and Cardiology prior to scheduling EBUS.  This procedure can be completed as outpatient and after Discussion with Dr. Sabra Heck, patient will  be discharged home and will follow up as outpatient   Corrin Parker, M.D.  [facility]  Medical Director [place] Director [facility] Cardio-Pulmonary Department

## 2014-12-23 NOTE — Progress Notes (Signed)
Patient rested quietly most of the day. Family now at bedside. Asking questions about Dr. Zoila Shutter plan - informed that according to MD note, it looks like the test could be done outpatient. No other questions or complaints at this time.

## 2014-12-24 LAB — CBC
HCT: 35.1 % — ABNORMAL LOW (ref 40.0–52.0)
HEMOGLOBIN: 12 g/dL — AB (ref 13.0–18.0)
MCH: 33 pg (ref 26.0–34.0)
MCHC: 34.2 g/dL (ref 32.0–36.0)
MCV: 96.5 fL (ref 80.0–100.0)
PLATELETS: 117 10*3/uL — AB (ref 150–440)
RBC: 3.64 MIL/uL — ABNORMAL LOW (ref 4.40–5.90)
RDW: 15 % — AB (ref 11.5–14.5)
WBC: 4.9 10*3/uL (ref 3.8–10.6)

## 2014-12-24 MED ORDER — DILTIAZEM HCL ER COATED BEADS 120 MG PO CP24
120.0000 mg | ORAL_CAPSULE | Freq: Every day | ORAL | Status: DC
  Administered 2014-12-24: 120 mg via ORAL
  Filled 2014-12-24: qty 1

## 2014-12-24 MED ORDER — DILTIAZEM HCL ER COATED BEADS 120 MG PO CP24: 120.0000 mg | ORAL_CAPSULE | Freq: Every day | ORAL | Status: DC

## 2014-12-24 NOTE — Progress Notes (Signed)
Pt a&o, VSS, A flutter on tele with no complaints of pain or discomfort. Orders to discharge pt to home. Discharge instructions and prescriptions given to pt with verbal acknowledgment of understanding. Pt just awaiting clearance from CM for newly started Eliquis for completion of discharge.

## 2014-12-24 NOTE — Care Management (Signed)
Patient to be discharged with Eliquis.  With patients permission I called his pharmacy [facility] and in [place] to obtained his monthly co pay.  Pharmacy states that he medications were ready for pick up and the monthly co pay is $45.  I let the pharmacist know that I would be providing the patient with a free 30 day card.  I informed the patient of his co pay and provided him with the free 30 day supply card.  Patient states that financially he should be able to afford his co pay, and his wife would take him to pick up the prescription at the time of discharge.  RN CM signing off.

## 2014-12-24 NOTE — Care Management Important Message (Signed)
Important Message  Patient Details  Name: Andres Gutierrez MRN: [id number] Date of Birth: 1930/11/29   Medicare Important Message Given:  Yes-second notification given    [name] 12/24/2014, 9:46 AM

## 2014-12-26 ENCOUNTER — Other Ambulatory Visit: Payer: Self-pay

## 2014-12-26 ENCOUNTER — Emergency Department
Admission: EM | Admit: 2014-12-26 | Discharge: 2014-12-26 | Disposition: A | Discharge status: Home / Self Care | Payer: PPO | Attending: Emergency Medicine | Admitting: Emergency Medicine

## 2014-12-26 ENCOUNTER — Encounter: Payer: Self-pay | Admitting: Emergency Medicine

## 2014-12-26 DIAGNOSIS — I10 Essential (primary) hypertension: Secondary | ICD-10-CM | POA: Insufficient documentation

## 2014-12-26 DIAGNOSIS — Z7982 Long term (current) use of aspirin: Secondary | ICD-10-CM | POA: Diagnosis not present

## 2014-12-26 DIAGNOSIS — W4904XA Ring or other jewelry causing external constriction, initial encounter: Secondary | ICD-10-CM | POA: Diagnosis not present

## 2014-12-26 DIAGNOSIS — R0602 Shortness of breath: Secondary | ICD-10-CM | POA: Diagnosis not present

## 2014-12-26 DIAGNOSIS — Z79811 Long term (current) use of aromatase inhibitors: Secondary | ICD-10-CM | POA: Diagnosis not present

## 2014-12-26 DIAGNOSIS — Y998 Other external cause status: Secondary | ICD-10-CM | POA: Diagnosis not present

## 2014-12-26 DIAGNOSIS — S60445A External constriction of left ring finger, initial encounter: Secondary | ICD-10-CM | POA: Diagnosis not present

## 2014-12-26 DIAGNOSIS — Y9289 Other specified places as the place of occurrence of the external cause: Secondary | ICD-10-CM | POA: Diagnosis not present

## 2014-12-26 DIAGNOSIS — Y9389 Activity, other specified: Secondary | ICD-10-CM | POA: Insufficient documentation

## 2014-12-26 DIAGNOSIS — Z79899 Other long term (current) drug therapy: Secondary | ICD-10-CM | POA: Insufficient documentation

## 2014-12-26 DIAGNOSIS — T461X5A Adverse effect of calcium-channel blockers, initial encounter: Secondary | ICD-10-CM | POA: Insufficient documentation

## 2014-12-26 DIAGNOSIS — Z87891 Personal history of nicotine dependence: Secondary | ICD-10-CM | POA: Diagnosis not present

## 2014-12-26 DIAGNOSIS — L299 Pruritus, unspecified: Secondary | ICD-10-CM | POA: Insufficient documentation

## 2014-12-26 DIAGNOSIS — T368X5A Adverse effect of other systemic antibiotics, initial encounter: Secondary | ICD-10-CM | POA: Insufficient documentation

## 2014-12-26 DIAGNOSIS — T50905A Adverse effect of unspecified drugs, medicaments and biological substances, initial encounter: Secondary | ICD-10-CM

## 2014-12-26 DIAGNOSIS — Z791 Long term (current) use of non-steroidal anti-inflammatories (NSAID): Secondary | ICD-10-CM | POA: Diagnosis not present

## 2014-12-26 DIAGNOSIS — Z792 Long term (current) use of antibiotics: Secondary | ICD-10-CM | POA: Diagnosis not present

## 2014-12-26 MED ORDER — METOPROLOL TARTRATE 25 MG PO TABS
25.0000 mg | ORAL_TABLET | Freq: Once | ORAL | Status: AC
Start: 2014-12-26 — End: 2014-12-26
  Administered 2014-12-26: 25 mg via ORAL
  Filled 2014-12-26: qty 1

## 2014-12-26 MED ORDER — PREDNISONE 20 MG PO TABS: 40.0000 mg | ORAL_TABLET | Freq: Every day | ORAL | Status: DC

## 2014-12-26 MED ORDER — ALBUTEROL SULFATE HFA 108 (90 BASE) MCG/ACT IN AERS: 2.0000 | INHALATION_SPRAY | Freq: Four times a day (QID) | RESPIRATORY_TRACT | Status: DC | PRN

## 2014-12-26 MED ORDER — METOPROLOL TARTRATE 25 MG PO TABS: 25.0000 mg | ORAL_TABLET | Freq: Two times a day (BID) | ORAL | Status: DC

## 2014-12-26 MED ORDER — IPRATROPIUM-ALBUTEROL 0.5-2.5 (3) MG/3ML IN SOLN
3.0000 mL | Freq: Once | RESPIRATORY_TRACT | Status: AC
  Administered 2014-12-26: 3 mL via RESPIRATORY_TRACT
  Filled 2014-12-26: qty 3

## 2014-12-26 MED ORDER — PREDNISONE 20 MG PO TABS
40.0000 mg | ORAL_TABLET | Freq: Once | ORAL | Status: AC
  Administered 2014-12-26: 40 mg via ORAL
  Filled 2014-12-26: qty 2

## 2014-12-26 NOTE — ED Notes (Signed)
Pt vitals stable and NAD. Pt and family verbalized understanding of new medications and discharge orders.

## 2014-12-26 NOTE — ED Notes (Signed)
Pt discharged from hospital Friday after having cp and sob and was told he had enlarged lymph nodes on his chest, states he was started on Eliquis and Cartia at discharge and prior to hospital admission he was placed on cipro prior to hospitalization and then started back on cipro after being discharged, states last night he started having welts and rash to arms and back area, swelling to hands with some sob, states he took a benadryl this am at 1000 and rash has improved but still present and still having some slight sob, no distress noted at this time

## 2014-12-26 NOTE — ED Provider Notes (Signed)
[facility] Emergency Department Provider Note  ____________________________________________  Time seen: Approximately 4:58 PM  I have reviewed the triage vital signs and the nursing notes.   HISTORY  Chief Complaint Allergic Reaction and Shortness of Breath    HPI Andres Gutierrez is a 81 y.o. male who has a recent new diagnosis of atrial flutter. He was discharged from the hospital just 2 days ago with prescriptions for diltiazem, Eliquis, and also restarted ciprofloxacin which she was on approximately one week ago for an upper respiratory infection. He states that last night around 9 PM he started to notice that he is having some itching on his arms, and today his hands feel swollen. He saw some slight raised areas but he does not describe them as "whelps" which went away after taking Benadryl at about 10 AM this morning. He's had a slight feeling of shortness of breath and some occasional wheezing which she reports is been ongoing since he was hospitalized, and is actually slightly better.  No fevers or chills. No productive cough. He reports that his upper respiratory infection is much better last couple of days. He has not had any chest pain. No abdominal pain. No lip or throat swelling. No nausea or vomiting.   Past Medical History  Diagnosis Date  . Hypertension   . MI (myocardial infarction)     x 2    Patient Active Problem List   Diagnosis Date Noted  . Chest pain 12/22/2014  . New onset atrial flutter 12/22/2014  . Abnormal CT scan, chest 12/22/2014  . CAD (coronary artery disease) 12/22/2014  . HTN (hypertension) 12/22/2014    Past Surgical History  Procedure Laterality Date  . Coronary artery bypass graft    . Coronary angioplasty with stent placement      Current Outpatient Rx  Name  Route  Sig  Dispense  Refill  . albuterol (PROVENTIL HFA;VENTOLIN HFA) 108 (90 BASE) MCG/ACT inhaler   Inhalation   Inhale 2 puffs into the lungs every 6  (six) hours as needed for wheezing or shortness of breath.   1 Inhaler   2   . apixaban (ELIQUIS) 5 MG TABS tablet   Oral   Take 1 tablet (5 mg total) by mouth 2 (two) times daily.   60 tablet   11   . aspirin EC 81 MG tablet   Oral   Take 81 mg by mouth daily.         Marland Kitchen atorvastatin (LIPITOR) 20 MG tablet   Oral   Take 20 mg by mouth daily.         . carvedilol (COREG) 6.25 MG tablet   Oral   Take 6.25 mg by mouth 2 (two) times daily with a meal.         . ciprofloxacin (CIPRO) 500 MG tablet   Oral   Take 500 mg by mouth 2 (two) times daily. Just picked up today- has not started.         . diltiazem (CARDIZEM CD) 120 MG 24 hr capsule   Oral   Take 1 capsule (120 mg total) by mouth daily.         . metoprolol tartrate (LOPRESSOR) 25 MG tablet   Oral   Take 1 tablet (25 mg total) by mouth 2 (two) times daily.   60 tablet   0   . pantoprazole (PROTONIX) 40 MG tablet   Oral   Take 40 mg by mouth daily.         Marland Kitchen  potassium chloride (K-DUR) 10 MEQ tablet   Oral   Take 10 mEq by mouth 2 (two) times daily.         . predniSONE (DELTASONE) 20 MG tablet   Oral   Take 2 tablets (40 mg total) by mouth daily with breakfast.   10 tablet   0   . sucralfate (CARAFATE) 1 G tablet   Oral   Take 1 g by mouth 4 (four) times daily.         . tamsulosin (FLOMAX) 0.4 MG CAPS capsule   Oral   Take 0.4 mg by mouth daily.           Allergies Diltiazem  Family History  Problem Relation Age of Onset  . CAD Mother   . Colon cancer Father     Social History Social History  Substance Use Topics  . Smoking status: Former Research scientist (life sciences)  . Smokeless tobacco: None  . Alcohol Use: Yes    Review of Systems Constitutional: No fever/chills Eyes: No visual changes. ENT: No sore throat. Cardiovascular: Denies chest pain. Respiratory: See history of present illness Gastrointestinal: No abdominal pain.  No nausea, no vomiting.  No diarrhea.  No  constipation. Genitourinary: Negative for dysuria. Musculoskeletal: Negative for back pain. Skin: Negative for rash now except for slightly swollen hands. Neurological: Negative for headaches, focal weakness or numbness.  10-point ROS otherwise negative.  ____________________________________________   PHYSICAL EXAM:  VITAL SIGNS: ED Triage Vitals  Enc Vitals Group     BP 12/26/14 1411 113/71 mmHg     Pulse Rate 12/26/14 1411 68     Resp 12/26/14 1411 16     Temp 12/26/14 1411 97.7 F (36.5 C)     Temp Source 12/26/14 1411 Oral     SpO2 12/26/14 1411 98 %     Weight 12/26/14 1411 220 lb (99.791 kg)     Height 12/26/14 1411 6\' 4"  (1.93 m)     Head Cir --      Peak Flow --      Pain Score 12/26/14 1419 0     Pain Loc --      Pain Edu? --      Excl. in [place]? --     Constitutional: Alert and oriented. Well appearing and in no acute distress. Eyes: Conjunctivae are normal. PERRL. EOMI. Head: Atraumatic. Nose: No congestion/rhinnorhea. Mouth/Throat: Mucous membranes are moist.  Oropharynx non-erythematous. Neck: No stridor.   Cardiovascular: Normal rate, regular rhythm. Grossly normal heart sounds.  Good peripheral circulation. Respiratory: Normal respiratory effort.  No retractions. Lungs CTAB. Gastrointestinal: Soft and nontender. No distention. No abdominal bruits. No CVA tenderness. Musculoskeletal: No lower extremity tenderness nor edema.  No joint effusions. Neurologic:  Normal speech and language. No gross focal neurologic deficits are appreciated. No gait instability. Skin:  Skin is warm, dry and intact. No rash noted. He does have some very minimal edema in both hands bilaterally. I discussed having his left ring finger ring finger's wedding ring removed, he does not wish to have this done at this time but we did discuss that if the  swelling in it starts having pain in that finger or cold or blueness in his finger that he needs to come back right away and have it  removed. Psychiatric: Mood and affect are normal. Speech and behavior are normal.  ____________________________________________   LABS (all labs ordered are listed, but only abnormal results are displayed)  Labs Reviewed - No data to display ____________________________________________  EKG  Reviewed and interpreted by me Atrial flutter, ventricular rate 78 QRS 110 QTc 470 No acute ischemic changes.  Interpreted as atrial flutter, rate controlled. ____________________________________________  RADIOLOGY   ____________________________________________   PROCEDURES  Procedure(s) performed: None  Critical Care performed: No  ____________________________________________   INITIAL IMPRESSION / ASSESSMENT AND PLAN / ED COURSE  Pertinent labs & imaging results that were available during my care of the patient were reviewed by me and considered in my medical decision making (see chart for details).  Discussion with the patient, and review of his new medications I suspect that most likely diltiazem as culprit, and he is going to stop his Cipro because he is no longer having any infectious or upper respiratory infection symptoms. He does have some mild wheezing, but reports that he had wheezing throughout the night while in the hospital as well. We'll start him on prednisone and give him an inhaler here. His reaction is mild at this time, but we will discuss with cardiology changing his medication from diltiazem to a different rate control area in addition, I do not believe this is due to Eliquis is to be a somewhat unusual reaction for Eliquis and it seems that diltiazem and calcium channel blocker be the most likely culprits of itching and swelling in the hands. No signs of anaphylaxis or severe angioedema at this time.  ----------------------------------------- 6:03 PM on 12/26/2014 -----------------------------------------  Patient reports he feels well at this time. Discussed  with Dr. Ubaldo Glassing and we will change his diltiazem over to Lopressor 25 mg twice a day and he has follow-up Tuesday with Dr. Sabra Heck. Return precautions including increasing swelling, trouble breathing, worsening shortness of breath or other new concerns discussed with the patient. He will stop diltiazem and ciprofloxacin.  I plan to discharge him to home after giving Lopressor and observing him in the ER for approximately 30 minutes thereafter. ____________________________________________   FINAL CLINICAL IMPRESSION(S) / ED DIAGNOSES  Final diagnoses:  Adverse drug effect, initial encounter      Delman Kitten, MD 12/26/14 1904

## 2014-12-26 NOTE — Discharge Instructions (Signed)
Please stop diltiazem. Believe you've had an allergic reaction to this medicine. Please changed to Lopressor which I have prescribed. Follow up closely with Dr. Ubaldo Glassing and Dr. Sabra Heck this week. Please return to the emergency room if you develop a racing heart, trouble breathing, notice increased swelling, a fever, have trouble swallowing, or other new concerns arise.  Drug Allergy A drug allergy means you have a strange reaction to a medicine. You may have puffiness (swelling), itching, red rashes, and hives. Some allergic reactions can be life-threatening. HOME CARE  If you do not know what caused your reaction:  Write down medicines you use.  Write down any problems you have after using medicine.  Avoid things that cause a reaction.  You can see an allergy doctor to be tested for allergies. If you have hives or a rash:  Take medicine as told by your doctor.  Place cold cloths on your skin.  Do not take hot baths or hot showers. Take baths in cool water. If you are severely allergic:  Wear a medical bracelet or necklace that lists your allergy.  Carry your allergy kit or medicine shot to treat severe allergic reactions with you. These can save your life.  Do not drive until medicine from your shot has worn off, unless your doctor says it is okay. GET HELP RIGHT AWAY IF:   Your mouth is puffy, or you have trouble breathing.  You have a tight feeling in your chest or throat.  You have hives, puffiness, or itching all over your body.  You throw up (vomit) or have watery poop (diarrhea).  You feel dizzy or pass out (faint).  You think you are having a reaction. Problems often start within 30 minutes after taking a medicine.  You are getting worse, not better.  You have new problems.  Your problems go away and then come back. This is an emergency. Use your medicine shot or allergy kit as told. Call yourlocal emergency services (911 in [place] after the shot. Even if you feel  better after the shot, you need to go to the hospital. You may need more medicine to control a severe reaction. MAKE SURE YOU:  Understand these instructions.  Will watch your condition.  Will get help right away if you are not doing well or get worse. Document Released: 06/07/2004 Document Revised: 07/23/2011 Document Reviewed: 10/26/2010 [facility] Patient Information 2015 [place], [place]. This information is not intended to replace advice given to you by your health care provider. Make sure you discuss any questions you have with your health care provider.

## 2014-12-28 ENCOUNTER — Other Ambulatory Visit: Payer: PPO

## 2014-12-30 ENCOUNTER — Encounter
Admission: RE | Admit: 2014-12-30 | Discharge: 2014-12-30 | Disposition: A | Discharge status: Home / Self Care | Payer: PPO | Source: Ambulatory Visit | Attending: Internal Medicine | Admitting: Internal Medicine

## 2014-12-30 DIAGNOSIS — Z87891 Personal history of nicotine dependence: Secondary | ICD-10-CM | POA: Diagnosis not present

## 2014-12-30 DIAGNOSIS — Z7982 Long term (current) use of aspirin: Secondary | ICD-10-CM | POA: Diagnosis not present

## 2014-12-30 DIAGNOSIS — I252 Old myocardial infarction: Secondary | ICD-10-CM | POA: Diagnosis not present

## 2014-12-30 DIAGNOSIS — I4892 Unspecified atrial flutter: Secondary | ICD-10-CM | POA: Diagnosis not present

## 2014-12-30 DIAGNOSIS — R590 Localized enlarged lymph nodes: Secondary | ICD-10-CM | POA: Diagnosis not present

## 2014-12-30 DIAGNOSIS — Z951 Presence of aortocoronary bypass graft: Secondary | ICD-10-CM | POA: Diagnosis not present

## 2014-12-30 DIAGNOSIS — I10 Essential (primary) hypertension: Secondary | ICD-10-CM | POA: Diagnosis not present

## 2014-12-30 DIAGNOSIS — Z79899 Other long term (current) drug therapy: Secondary | ICD-10-CM | POA: Diagnosis not present

## 2014-12-30 DIAGNOSIS — Z955 Presence of coronary angioplasty implant and graft: Secondary | ICD-10-CM | POA: Diagnosis not present

## 2014-12-30 HISTORY — DX: Cough: R05

## 2014-12-30 HISTORY — DX: Cardiomyopathy, unspecified: I42.9

## 2014-12-30 HISTORY — DX: Localized enlarged lymph nodes: R59.0

## 2014-12-30 HISTORY — DX: Cough, unspecified: R05.9

## 2014-12-30 HISTORY — DX: Atherosclerotic heart disease of native coronary artery without angina pectoris: I25.10

## 2014-12-30 HISTORY — DX: Wheezing: R06.2

## 2014-12-30 HISTORY — DX: Cardiac arrhythmia, unspecified: I49.9

## 2014-12-30 NOTE — Patient Instructions (Addendum)
  Your procedure is scheduled on: 12/31/14 Report to Day Surgery.[facility] SECOND FLOOR To find out your arrival time please call [phone] between 1PM - 3PM on  12/30/14  Remember: Instructions that are not followed completely may result in serious medical risk, up to and including death, or upon the discretion of your surgeon and anesthesiologist your surgery may need to be rescheduled.    ___X_ 1. Do not eat food or drink liquids after midnight. No gum chewing or hard candies.     _X___ 2. No Alcohol for 24 hours before or after surgery.   ____ 3. Bring all medications with you on the day of surgery if instructed.    __X__ 4. Notify your doctor if there is any change in your medical condition     (cold, fever, infections).     Do not wear jewelry, make-up, hairpins, clips or nail polish.  Do not wear lotions, powders, or perfumes. You may wear deodorant.  Do not shave 48 hours prior to surgery. Men may shave face and neck.  Do not bring valuables to the hospital.    [facility] is not responsible for any belongings or valuables.               Contacts, dentures or bridgework may not be worn into surgery.  Leave your suitcase in the car. After surgery it may be brought to your room.  For patients admitted to the hospital, discharge time is determined by your                treatment team.   Patients discharged the day of surgery will not be allowed to drive home.   Please read over the following fact sheets that you were given:   Surgical Site Infection Prevention   ____ Take these medicines the morning of surgery with A SIP OF WATER:    1. COREG  2. METOPROLOL  3. PANTOPRAZOLE  4.  5.  6.  ____ Fleet Enema (as directed)   ____ Use CHG Soap as directed  __X__ Use inhalers on the day of surgery  ____ Stop metformin 2 days prior to surgery    ____ Take 1/2 of usual insulin dose the night before surgery and none on the morning of surgery.   __X__ Stop  Coumadin/Plavix/aspirin on   ELIQUIS AND ASPIRIN ALREADY STOPPED  ____ Stop Anti-inflammatories on    ____ Stop supplements until after surgery.    ____ Bring C-Pap to the hospital.

## 2014-12-31 ENCOUNTER — Encounter: Payer: Self-pay | Admitting: Anesthesiology

## 2014-12-31 ENCOUNTER — Ambulatory Visit: Payer: PPO | Admitting: Registered Nurse

## 2014-12-31 ENCOUNTER — Ambulatory Visit
Admission: RE | Admit: 2014-12-31 | Discharge: 2014-12-31 | Disposition: A | Discharge status: Home / Self Care | Payer: PPO | Source: Ambulatory Visit | Attending: Internal Medicine | Admitting: Internal Medicine

## 2014-12-31 ENCOUNTER — Encounter
Admission: RE | Disposition: A | Discharge status: Home / Self Care | Payer: Self-pay | Source: Ambulatory Visit | Attending: Internal Medicine

## 2014-12-31 DIAGNOSIS — R590 Localized enlarged lymph nodes: Secondary | ICD-10-CM | POA: Diagnosis not present

## 2014-12-31 DIAGNOSIS — Z79899 Other long term (current) drug therapy: Secondary | ICD-10-CM | POA: Insufficient documentation

## 2014-12-31 DIAGNOSIS — Z87891 Personal history of nicotine dependence: Secondary | ICD-10-CM | POA: Insufficient documentation

## 2014-12-31 DIAGNOSIS — I252 Old myocardial infarction: Secondary | ICD-10-CM | POA: Insufficient documentation

## 2014-12-31 DIAGNOSIS — R591 Generalized enlarged lymph nodes: Secondary | ICD-10-CM

## 2014-12-31 DIAGNOSIS — I4892 Unspecified atrial flutter: Secondary | ICD-10-CM | POA: Insufficient documentation

## 2014-12-31 DIAGNOSIS — R599 Enlarged lymph nodes, unspecified: Secondary | ICD-10-CM | POA: Insufficient documentation

## 2014-12-31 DIAGNOSIS — Z955 Presence of coronary angioplasty implant and graft: Secondary | ICD-10-CM | POA: Insufficient documentation

## 2014-12-31 DIAGNOSIS — Z7982 Long term (current) use of aspirin: Secondary | ICD-10-CM | POA: Insufficient documentation

## 2014-12-31 DIAGNOSIS — Z951 Presence of aortocoronary bypass graft: Secondary | ICD-10-CM | POA: Insufficient documentation

## 2014-12-31 DIAGNOSIS — I10 Essential (primary) hypertension: Secondary | ICD-10-CM | POA: Insufficient documentation

## 2014-12-31 HISTORY — PX: ENDOBRONCHIAL ULTRASOUND: SHX5096

## 2014-12-31 HISTORY — PX: BRONCHIAL NEEDLE ASPIRATION BIOPSY: SHX5106

## 2014-12-31 SURGERY — ENDOBRONCHIAL ULTRASOUND (EBUS)
Anesthesia: General

## 2014-12-31 MED ORDER — FENTANYL CITRATE (PF) 100 MCG/2ML IJ SOLN: 25.0000 ug | INTRAMUSCULAR | Status: DC | PRN

## 2014-12-31 MED ORDER — ONDANSETRON HCL 4 MG/2ML IJ SOLN: 4.0000 mg | Freq: Once | INTRAMUSCULAR | Status: DC | PRN

## 2014-12-31 MED ORDER — LIDOCAINE HCL (CARDIAC) 20 MG/ML IV SOLN
INTRAVENOUS | Status: DC | PRN
  Administered 2014-12-31: 100 mg via INTRAVENOUS

## 2014-12-31 MED ORDER — FENTANYL CITRATE (PF) 100 MCG/2ML IJ SOLN
INTRAMUSCULAR | Status: DC | PRN
  Administered 2014-12-31: 50 ug via INTRAVENOUS

## 2014-12-31 MED ORDER — ESMOLOL HCL 10 MG/ML IV SOLN
INTRAVENOUS | Status: DC | PRN
  Administered 2014-12-31: 10 mg via INTRAVENOUS

## 2014-12-31 MED ORDER — DEXAMETHASONE SODIUM PHOSPHATE 4 MG/ML IJ SOLN
INTRAMUSCULAR | Status: DC | PRN
  Administered 2014-12-31: 10 mg via INTRAVENOUS

## 2014-12-31 MED ORDER — PROPOFOL INFUSION 10 MG/ML OPTIME
INTRAVENOUS | Status: DC | PRN
  Administered 2014-12-31: 150 ug/kg/min via INTRAVENOUS

## 2014-12-31 MED ORDER — MIDAZOLAM HCL 2 MG/2ML IJ SOLN
INTRAMUSCULAR | Status: DC | PRN
  Administered 2014-12-31: 2 mg via INTRAVENOUS

## 2014-12-31 MED ORDER — SUCCINYLCHOLINE CHLORIDE 20 MG/ML IJ SOLN
INTRAMUSCULAR | Status: DC | PRN
  Administered 2014-12-31: 100 mg via INTRAVENOUS

## 2014-12-31 MED ORDER — PHENYLEPHRINE HCL 0.25 % NA SOLN
1.0000 | Freq: Four times a day (QID) | NASAL | Status: DC | PRN
  Filled 2014-12-31: qty 15

## 2014-12-31 MED ORDER — LACTATED RINGERS IV SOLN
INTRAVENOUS | Status: DC
  Administered 2014-12-31: 13:00:00 via INTRAVENOUS

## 2014-12-31 MED ORDER — GLYCOPYRROLATE 0.2 MG/ML IJ SOLN
INTRAMUSCULAR | Status: DC | PRN
  Administered 2014-12-31: 0.2 mg via INTRAVENOUS

## 2014-12-31 MED ORDER — PHENYLEPHRINE HCL 10 MG/ML IJ SOLN
INTRAMUSCULAR | Status: DC | PRN
  Administered 2014-12-31 (×2): 100 ug via INTRAVENOUS

## 2014-12-31 MED ORDER — PROPOFOL 10 MG/ML IV BOLUS
INTRAVENOUS | Status: DC | PRN
  Administered 2014-12-31: 100 mg via INTRAVENOUS

## 2014-12-31 MED ORDER — LIDOCAINE HCL 2 % EX GEL: 1.0000 "application " | Freq: Once | CUTANEOUS | Status: DC

## 2014-12-31 NOTE — Op Note (Addendum)
[facility] Patient Name: Andres Gutierrez Procedure Date: 12/31/2014 12:24 PM MRN: [id number] Account #: [id number] Date of Birth: 1930/07/19 Admit Type: Outpatient Age: 81 Room: Bronch Suite on 2nd floor Gender: Male Note Status: Supervisor Override Attending MD: Patricia Pesa, MD Procedure:         Bronchoscopy Indications:       Mediastinal adenopathy Providers:         Patricia Pesa, MD, Annia Belt, Admin. Environmental education officer) Referring MD:       Medicines:         Lidocaine 1% subglottic space 1 mL Complications:     No immediate complications Procedure:         Pre-Anesthesia Assessment:                    - A History and Physical has been performed. The patient's                     medications, allergies and sensitivities have been                     reviewed.                    After obtaining informed consent, the bronchoscope was                     passed under direct vision. Throughout the procedure, the                     patient's blood pressure, pulse, and oxygen saturations                     were monitored continuously. the Bronchofibervideoscope                     Olympus [id number] was introduced through the                     mouth, via the endotracheal tube and advanced to the                     carina. The procedure was accomplished without difficulty.                     The patient tolerated the procedure fairly well. Findings:      Lymph Nodes: The subcarinal mediastinum (level 7) nodes were       endosonographically normal as examined by endobronchial ultrasound       (EBUS). Transbronchial biopsies were performed of the level 7       (subcarinal) lymph node(s) of the lung using a 21 gauge Wang needle and       sent for routine cytology. The procedure was guided by ultrasound. Three       biopsy passes were performed. Three biopsy samples were obtained. Impression:        - Mediastinal adenopathy                    - The subcarinal  mediastinum (level 7) lymph nodes were                     endosonographically normal.                    - Transbronchial lung biopsies were performed. Recommendation:    - Await  biopsy results. Patricia Pesa MD Patricia Pesa, MD 12/31/2014 1:43:35 PM This report has been signed electronically. Number of Addenda: 0 Note Initiated On: 12/31/2014 12:24 PM      [facility]

## 2014-12-31 NOTE — Discharge Instructions (Signed)
Flexible Bronchoscopy, Care After Refer to this sheet in the next few weeks. These instructions provide you with information on caring for yourself after your procedure. Your health care provider may also give you more specific instructions. Your treatment has been planned according to current medical practices, but problems sometimes occur. Call your health care provider if you have any problems or questions after your procedure.  WHAT TO EXPECT AFTER THE PROCEDURE It is normal to have the following symptoms for 24-48 hours after the procedure:   Increased cough.  Low-grade fever.  Sore throat or hoarse voice.  Small streaks of blood in your thick spit (sputum) if tissue samples were taken (biopsy). HOME CARE INSTRUCTIONS   Do not eat or drink anything for 2 hours after your procedure. Your nose and throat were numbed by medicine. If you try to eat or drink before the medicine wears off, food or drink could go into your lungs or you could burn yourself. After the numbness is gone and your cough and gag reflexes have returned, you may eat soft food and drink liquids slowly.   The day after the procedure, you can go back to your normal diet.   You may resume normal activities.   Keep all follow-up visits as directed by your health care provider. It is important to keep all your appointments, especially if tissue samples were taken for testing (biopsy). SEEK IMMEDIATE MEDICAL CARE IF:   You have increasing shortness of breath.   You become light-headed or faint.   You have chest pain.   You have any new concerning symptoms.  You cough up more than a small amount of blood.  The amount of blood you cough up increases. MAKE SURE YOU:  Understand these instructions.  Will watch your condition.  Will get help right away if you are not doing well or get worse. Document Released: 11/17/2004 Document Revised: 09/14/2013 Document Reviewed: 01/02/2013 [facility] Patient Information  2015 [place], [place]. This information is not intended to replace advice given to you by your health care provider. Make sure you discuss any questions you have with your health care provider.  AMBULATORY SURGERY  DISCHARGE INSTRUCTIONS   1) The drugs that you were given will stay in your system until tomorrow so for the next 24 hours you should not:  A) Drive an automobile B) Make any legal decisions C) Drink any alcoholic beverage   2) You may resume regular meals tomorrow.  Today it is better to start with liquids and gradually work up to solid foods.  You may eat anything you prefer, but it is better to start with liquids, then soup and crackers, and gradually work up to solid foods.   3) Please notify your doctor immediately if you have any unusual bleeding, trouble breathing, redness and pain at the surgery site, drainage, fever, or pain not relieved by medication.    Additional Instructions:Soft foods today would be best.  Cold might feel better than hot.  Stay away from dairy items for the first day.         DRINK A LOT OF FLUIDS.    Please contact your physician with any problems or Same Day Surgery at [phone], Monday through Friday 6 am to 4 pm, or [place] [facility] number at [phone].

## 2014-12-31 NOTE — Anesthesia Procedure Notes (Signed)
Procedure Name: Intubation Date/Time: 12/31/2014 1:24 PM Performed by: Doreen Salvage Pre-anesthesia Checklist: Patient identified, Patient being monitored, Timeout performed, Emergency Drugs available and Suction available Patient Re-evaluated:Patient Re-evaluated prior to inductionOxygen Delivery Method: Circle System Utilized Preoxygenation: Pre-oxygenation with 100% oxygen Intubation Type: IV induction Ventilation: Mask ventilation without difficulty Laryngoscope Size: Mac and 3 Grade View: Grade II Tube type: Oral Tube size: 8.5 mm Number of attempts: 1 Airway Equipment and Method: Stylet Placement Confirmation: ETT inserted through vocal cords under direct vision,  positive ETCO2 and breath sounds checked- equal and bilateral Secured at: 21 cm Tube secured with: Tape Dental Injury: Teeth and Oropharynx as per pre-operative assessment

## 2014-12-31 NOTE — Progress Notes (Signed)
Pt states tongue feels thick  No noted swelling coughing less   Heart rate better  resp rate16 to 20

## 2014-12-31 NOTE — Anesthesia Preprocedure Evaluation (Addendum)
Anesthesia Evaluation  Patient identified by MRN, date of birth, ID band Patient awake    Reviewed: Allergy & Precautions, NPO status , Patient's Chart, lab work & pertinent test results, reviewed documented beta blocker date and time   Airway Mallampati: II  TM Distance: >3 FB     Dental  (+) Chipped, Dental Advisory Given, Upper Dentures, Poor Dentition   Pulmonary former smoker,          Cardiovascular hypertension, Pt. on medications and Pt. on home beta blockers + CAD and + Past MI + dysrhythmias     Neuro/Psych    GI/Hepatic   Endo/Other    Renal/GU      Musculoskeletal   Abdominal   Peds  Hematology   Anesthesia Other Findings MI in 1978, MI in 1984, CABG in 1984. Doing well now. Has a few teeth at the botom in very poor condition.  Reproductive/Obstetrics                            Anesthesia Physical Anesthesia Plan  ASA: III  Anesthesia Plan: General   Post-op Pain Management:    Induction: Intravenous  Airway Management Planned: Oral ETT  Additional Equipment:   Intra-op Plan:   Post-operative Plan:   Informed Consent: I have reviewed the patients History and Physical, chart, labs and discussed the procedure including the risks, benefits and alternatives for the proposed anesthesia with the patient or authorized representative who has indicated his/her understanding and acceptance.     Plan Discussed with: CRNA  Anesthesia Plan Comments:         Anesthesia Quick Evaluation

## 2014-12-31 NOTE — Interval H&P Note (Signed)
History and Physical Interval Note:  12/31/2014 1:11 PM  Andres Gutierrez  has presented today for surgery, with the diagnosis of right hilar,right peribronchial and mediastinal nodes.  The various methods of treatment have been discussed with the patient and family. After consideration of risks, benefits and other options for treatment, the patient has consented to  Procedure(s): ENDOBRONCHIAL ULTRASOUND (N/A) as a surgical intervention .  The patient's history has been reviewed, patient examined, no change in status, stable for surgery.  I have reviewed the patient's chart and labs.  Questions were answered to the patient's satisfaction.     Flora Lipps

## 2014-12-31 NOTE — H&P (View-Only) (Signed)
Patient ID: Andres Gutierrez, male   DOB: 27-Nov-1930, 81 y.o.   MRN: [id number] Andres Gutierrez is a 81 y.o. male   SUBJECTIVE:  Patient admitted this morning with chest discomfort and rapid a flutter. IV Cardizem brought the heart rate down. He currently has no chest pain. CT of the chest did show a large right hilum with small right pleural effusion. Asymptomatic this morning, still in a flutter  ______________________________________________________________________  ROS: Review of systems is unremarkable for any active cardiac,respiratory, GI, GU, hematologic, neurologic or psychiatric systems, 10 systems reviewed.  Marland Kitchen aspirin EC  81 mg Oral Daily  . atorvastatin  20 mg Oral Daily  . carvedilol  6.25 mg Oral BID WC  . ciprofloxacin  500 mg Oral BID  . diltiazem  180 mg Oral Daily  . pantoprazole  40 mg Oral Daily  . potassium chloride  10 mEq Oral Daily  . sodium chloride  3 mL Intravenous Q12H  . sucralfate  1 g Oral QID  . tamsulosin  0.4 mg Oral Daily   acetaminophen **OR** acetaminophen, guaiFENesin, ondansetron **OR** ondansetron (ZOFRAN) IV   Past Medical History  Diagnosis Date  . Hypertension   . MI (myocardial infarction)     x 2    Past Surgical History  Procedure Laterality Date  . Coronary artery bypass graft    . Coronary angioplasty with stent placement      PHYSICAL EXAM:  BP 107/65 mmHg  Pulse 87  Temp(Src) 98.1 F (36.7 C) (Oral)  Resp 18  Ht 6\' 4"  (1.93 m)  Wt 100.789 kg (222 lb 3.2 oz)  BMI 27.06 kg/m2  SpO2 98%  Wt Readings from Last 3 Encounters:  12/23/14 100.789 kg (222 lb 3.2 oz)           BP Readings from Last 3 Encounters:  12/23/14 107/65    Constitutional: NAD Neck: supple, no thyromegaly Respiratory: CTA, no rales or wheezes Cardiovascular: RRR, no murmur, no gallop Abdomen: soft, good BS, nontender Extremities: no edema Neuro: alert and oriented, no focal motor or sensory deficits  ASSESSMENT/PLAN:  Labs and imaging studies  were reviewed  Chest discomfort-troponins negative, cardiology thoughts.  A flutter still a flutter, rate controlled with diltiazem, IV heparin, will start Eliquis as an outpatient, allowing biopsy to heal Right hilar lymph nodes-patient request biopsy today  CAD-no MI Discharge home depends on pulmonary workup

## 2014-12-31 NOTE — Transfer of Care (Signed)
Immediate Anesthesia Transfer of Care Note  Patient: Andres Gutierrez  Procedure(s) Performed: Procedure(s): ENDOBRONCHIAL ULTRASOUND (N/A) BRONCHIAL NEEDLE ASPIRATION BIOPSIES from carina (N/A)  Patient Location: PACU  Anesthesia Type:General  Level of Consciousness: sedated  Airway & Oxygen Therapy: Patient Spontanous Breathing and Patient connected to face mask oxygen  Post-op Assessment: Report given to RN and Post -op Vital signs reviewed and stable  Post vital signs: Reviewed and stable  Last Vitals:  Filed Vitals:   12/31/14 1400  BP: 113/78  Pulse:   Temp: 36.4 C  Resp: 23    Complications: No apparent anesthesia complications

## 2015-01-01 NOTE — Anesthesia Postprocedure Evaluation (Signed)
  Anesthesia Post-op Note  Patient: Andres Gutierrez  Procedure(s) Performed: Procedure(s): ENDOBRONCHIAL ULTRASOUND (N/A) BRONCHIAL NEEDLE ASPIRATION BIOPSIES from carina (N/A)  Anesthesia type:General  Patient location: PACU  Post pain: Pain level controlled  Post assessment: Post-op Vital signs reviewed, Patient's Cardiovascular Status Stable, Respiratory Function Stable, Patent Airway and No signs of Nausea or vomiting  Post vital signs: Reviewed and stable  Last Vitals:  Filed Vitals:   12/31/14 1533  BP: 125/70  Pulse: 79  Temp:   Resp: 16    Level of consciousness: awake, alert  and patient cooperative  Complications: No apparent anesthesia complications

## 2015-01-05 LAB — CYTOLOGY - NON PAP

## 2015-01-21 ENCOUNTER — Encounter: Payer: Self-pay | Admitting: Internal Medicine

## 2015-02-27 ENCOUNTER — Emergency Department: Payer: PPO

## 2015-02-27 ENCOUNTER — Emergency Department
Admission: EM | Admit: 2015-02-27 | Discharge: 2015-02-27 | Disposition: A | Discharge status: Home / Self Care | Payer: PPO | Attending: Emergency Medicine | Admitting: Emergency Medicine

## 2015-02-27 ENCOUNTER — Encounter: Payer: Self-pay | Admitting: Emergency Medicine

## 2015-02-27 DIAGNOSIS — I10 Essential (primary) hypertension: Secondary | ICD-10-CM | POA: Insufficient documentation

## 2015-02-27 DIAGNOSIS — X58XXXA Exposure to other specified factors, initial encounter: Secondary | ICD-10-CM | POA: Insufficient documentation

## 2015-02-27 DIAGNOSIS — Z7902 Long term (current) use of antithrombotics/antiplatelets: Secondary | ICD-10-CM | POA: Diagnosis not present

## 2015-02-27 DIAGNOSIS — Z7982 Long term (current) use of aspirin: Secondary | ICD-10-CM | POA: Diagnosis not present

## 2015-02-27 DIAGNOSIS — Z87891 Personal history of nicotine dependence: Secondary | ICD-10-CM | POA: Diagnosis not present

## 2015-02-27 DIAGNOSIS — Z79899 Other long term (current) drug therapy: Secondary | ICD-10-CM | POA: Insufficient documentation

## 2015-02-27 DIAGNOSIS — Y998 Other external cause status: Secondary | ICD-10-CM | POA: Diagnosis not present

## 2015-02-27 DIAGNOSIS — S76011A Strain of muscle, fascia and tendon of right hip, initial encounter: Secondary | ICD-10-CM | POA: Insufficient documentation

## 2015-02-27 DIAGNOSIS — Y9389 Activity, other specified: Secondary | ICD-10-CM | POA: Insufficient documentation

## 2015-02-27 DIAGNOSIS — S79921A Unspecified injury of right thigh, initial encounter: Secondary | ICD-10-CM | POA: Insufficient documentation

## 2015-02-27 DIAGNOSIS — S79911A Unspecified injury of right hip, initial encounter: Secondary | ICD-10-CM | POA: Diagnosis present

## 2015-02-27 DIAGNOSIS — Y9289 Other specified places as the place of occurrence of the external cause: Secondary | ICD-10-CM | POA: Diagnosis not present

## 2015-02-27 DIAGNOSIS — M25551 Pain in right hip: Secondary | ICD-10-CM

## 2015-02-27 DIAGNOSIS — W19XXXA Unspecified fall, initial encounter: Secondary | ICD-10-CM

## 2015-02-27 NOTE — Discharge Instructions (Signed)
Hip Pain Your hip is the joint between your upper legs and your lower pelvis. The bones, cartilage, tendons, and muscles of your hip joint perform a lot of work each day supporting your body weight and allowing you to move around. Hip pain can range from a minor ache to severe pain in one or both of your hips. Pain may be felt on the inside of the hip joint near the groin, or the outside near the buttocks and upper thigh. You may have swelling or stiffness as well.  HOME CARE INSTRUCTIONS   Take medicines only as directed by your health care provider.  Apply ice to the injured area:  Put ice in a plastic bag.  Place a towel between your skin and the bag.  Leave the ice on for 15-20 minutes at a time, 3-4 times a day.  Keep your leg raised (elevated) when possible to lessen swelling.  Avoid activities that cause pain.  Follow specific exercises as directed by your health care provider.  Sleep with a pillow between your legs on your most comfortable side.  Record how often you have hip pain, the location of the pain, and what it feels like. SEEK MEDICAL CARE IF:   You are unable to put weight on your leg.  Your hip is red or swollen or very tender to touch.  Your pain or swelling continues or worsens after 1 week.  You have increasing difficulty walking.  You have a fever. SEEK IMMEDIATE MEDICAL CARE IF:   You have fallen.  You have a sudden increase in pain and swelling in your hip. MAKE SURE YOU:   Understand these instructions.  Will watch your condition.  Will get help right away if you are not doing well or get worse.   This information is not intended to replace advice given to you by your health care provider. Make sure you discuss any questions you have with your health care provider.   Document Released: 10/18/2009 Document Revised: 05/21/2014 Document Reviewed: 12/25/2012 Elsevier Interactive Patient Education [employer].  Your exam and x-ray are  normal today. There is no evidence of fracture or dislocation to your right hip or pelvis.  Take Aleve daily as needed for pain relief.  Follow-up with Dr. Sabra Heck as needed.

## 2015-02-27 NOTE — ED Provider Notes (Signed)
[facility] Emergency Department Provider Note ____________________________________________  Time seen: 1245  I have reviewed the triage vital signs and the nursing notes.  HISTORY  Chief Complaint  Hip Pain  HPI Andres Gutierrez is a 81 y.o. male reports to the ED for evaluation of onset of hip and thigh pain on the right, this morning while showering. He describes he was stepping out of the shower morning when he had a sudden catch in his right hip and thigh. He denies any fall, near fall, Stumble, or strain preceding his sudden pain. He was able to hop towards the bed, and was able to call his adult son, on the phone, to come and assist him. He describes being unable to fully extend the hip, noted it was stuck in a flexed position. He describes pain was increased with attempts to weight-bear. He denies any referral of pain into the groin, or down the leg. He notes pain primarily from the lateral aspect of his iliac crest down the anterior lateral aspect of his upper thigh. He was transported here via private vehicle with the assistance of EMS at the house. He rates his pain currently at a 6/10 in triage, noted that it was much more painful at home.  Past Medical History  Diagnosis Date  . Hypertension   . MI (myocardial infarction) ([place])     x 2  . Coronary artery disease   . Dysrhythmia   . Cardiomyopathy ([place])   . Lymphadenopathy, hilar   . Wheezing   . Cough     Patient Active Problem List   Diagnosis Date Noted  . Adenopathy   . Chest pain 12/22/2014  . New onset atrial flutter ([place]) 12/22/2014  . Abnormal CT scan, chest 12/22/2014  . CAD (coronary artery disease) 12/22/2014  . HTN (hypertension) 12/22/2014    Past Surgical History  Procedure Laterality Date  . Coronary artery bypass graft    . Coronary angioplasty with stent placement    . Endobronchial ultrasound N/A 12/31/2014    Procedure: ENDOBRONCHIAL ULTRASOUND;  Surgeon: Flora Lipps, MD;   Location: [facility] ORS;  Service: Cardiopulmonary;  Laterality: N/A;  . Bronchial needle aspiration biopsy N/A 12/31/2014    Procedure: BRONCHIAL NEEDLE ASPIRATION BIOPSIES from carina;  Surgeon: Flora Lipps, MD;  Location: [facility] ORS;  Service: Cardiopulmonary;  Laterality: N/A;    Current Outpatient Rx  Name  Route  Sig  Dispense  Refill  . albuterol (PROVENTIL HFA;VENTOLIN HFA) 108 (90 BASE) MCG/ACT inhaler   Inhalation   Inhale 2 puffs into the lungs every 6 (six) hours as needed for wheezing or shortness of breath.   1 Inhaler   2   . apixaban (ELIQUIS) 5 MG TABS tablet   Oral   Take 1 tablet (5 mg total) by mouth 2 (two) times daily.   60 tablet   11   . aspirin EC 81 MG tablet   Oral   Take 81 mg by mouth daily.         Marland Kitchen atorvastatin (LIPITOR) 20 MG tablet   Oral   Take 20 mg by mouth daily.         . carvedilol (COREG) 6.25 MG tablet   Oral   Take 6.25 mg by mouth 2 (two) times daily with a meal.         . diltiazem (CARDIZEM CD) 120 MG 24 hr capsule   Oral   Take 1 capsule (120 mg total) by mouth daily.         Marland Kitchen  diltiazem (CARDIZEM CD) 180 MG 24 hr capsule   Oral   Take 180 mg by mouth daily.         . metoprolol tartrate (LOPRESSOR) 25 MG tablet   Oral   Take 1 tablet (25 mg total) by mouth 2 (two) times daily.   60 tablet   0   . pantoprazole (PROTONIX) 40 MG tablet   Oral   Take 40 mg by mouth daily.         . potassium chloride (K-DUR) 10 MEQ tablet   Oral   Take 10 mEq by mouth 2 (two) times daily.         . predniSONE (DELTASONE) 20 MG tablet   Oral   Take 2 tablets (40 mg total) by mouth daily with breakfast. Patient not taking: Reported on 12/31/2014   10 tablet   0   . sucralfate (CARAFATE) 1 G tablet   Oral   Take 1 g by mouth 4 (four) times daily.         . tamsulosin (FLOMAX) 0.4 MG CAPS capsule   Oral   Take 0.4 mg by mouth daily.         Marland Kitchen telmisartan (MICARDIS) 80 MG tablet   Oral   Take 80 mg by mouth daily.           Allergies Prednisone  Family History  Problem Relation Age of Onset  . CAD Mother   . Colon cancer Father     Social History Social History  Substance Use Topics  . Smoking status: Former Smoker    Quit date: 12/29/1976  . Smokeless tobacco: Current User    Types: Chew  . Alcohol Use: No     Comment: occational   Review of Systems  Constitutional: Negative for fever. Eyes: Negative for visual changes. ENT: Negative for sore throat. Cardiovascular: Negative for chest pain. Respiratory: Negative for shortness of breath. Gastrointestinal: Negative for abdominal pain, vomiting and diarrhea. Genitourinary: Negative for dysuria. Musculoskeletal: Negative for back pain. Right hip & thigh pain as above Skin: Negative for rash. Neurological: Negative for headaches, focal weakness or numbness. ____________________________________________  PHYSICAL EXAM:  VITAL SIGNS: ED Triage Vitals  Enc Vitals Group     BP 02/27/15 1148 142/90 mmHg     Pulse Rate 02/27/15 1148 93     Resp 02/27/15 1148 18     Temp 02/27/15 1148 97.7 F (36.5 C)     Temp Source 02/27/15 1148 Oral     SpO2 02/27/15 1148 99 %     Weight 02/27/15 1148 215 lb (97.523 kg)     Height 02/27/15 1148 6\' 4"  (1.93 m)     Head Cir --      Peak Flow --      Pain Score 02/27/15 1214 6     Pain Loc --      Pain Edu? --      Excl. in [place]? --    Constitutional: Alert and oriented. Well appearing and in no distress. Head: Normocephalic and atraumatic.      Eyes: Conjunctivae are normal. PERRL. Normal extraocular movements      Ears: Canals clear. TMs intact bilaterally.   Nose: No congestion/rhinorrhea.   Mouth/Throat: Mucous membranes are moist.   Neck: Supple. No thyromegaly. Hematological/Lymphatic/Immunological: No cervical lymphadenopathy. Cardiovascular: Normal rate, regular rhythm. Normal distal pulses. Respiratory: Normal respiratory effort. No wheezes/rales/rhonchi. Gastrointestinal:  Soft and nontender. No distention. Musculoskeletal: Right hip and thigh without any obvious deformity, abrasion,  or fixed, internal rotation of the leg. Patient is able to demonstrate full active range of motion in the supine position with hip flexion and extension. He also started to have normal hip rotation on exam. He is only minimally tender to palpation on the lateral trochanter on the right and some mild muscle tenderness of the IT band. Nontender with normal range of motion in all extremities.  Neurologic:  Normal gait without ataxia. Normal speech and language. No gross focal neurologic deficits are appreciated. Skin:  Skin is warm, dry and intact. No rash noted. Psychiatric: Mood and affect are normal. Patient exhibits appropriate insight and judgment. ____________________________________________   RADIOLOGY  Right Hip/Pelvis IMPRESSION: Negative.  I, Johnattan Strassman, Dannielle Karvonen, personally viewed and evaluated these images (plain radiographs) as part of my medical decision making.  ____________________________________________  INITIAL IMPRESSION / ASSESSMENT AND PLAN / ED COURSE  Reassurance to the patient and family following negative x-ray of the right hip regarding any fracture dislocation. Nontraumatic hip pain which appeared to be mechanical in nature is now resolving. The patient likely with a hip flexor strain or IT band strain. He will dose his home Aleve as needed for pain relief. He'll follow-up with Dr. Sabra Heck for ongoing symptoms. ____________________________________________  FINAL CLINICAL IMPRESSION(S) / ED DIAGNOSES  Final diagnoses:  Hip pain, acute, right  Strain of flexor muscle of hip, right, initial encounter     Melvenia Needles, PA-C 02/27/15 1551  Hinda Kehr, MD 02/27/15 [phone]

## 2015-02-27 NOTE — ED Notes (Signed)
AAOx3.  Skin warm and dry.  NAD 

## 2015-02-27 NOTE — ED Notes (Signed)
Sates he caught his right hip on shower this am . Having pain to right hip area and feels like area is "poping" unable to bear wt d/t pain

## 2015-04-28 ENCOUNTER — Other Ambulatory Visit: Payer: Self-pay | Admitting: Internal Medicine

## 2015-04-28 DIAGNOSIS — R590 Localized enlarged lymph nodes: Secondary | ICD-10-CM

## 2015-04-28 DIAGNOSIS — R1084 Generalized abdominal pain: Secondary | ICD-10-CM

## 2015-05-04 ENCOUNTER — Ambulatory Visit
Admission: RE | Admit: 2015-05-04 | Discharge: 2015-05-04 | Disposition: A | Discharge status: Home / Self Care | Payer: PPO | Source: Ambulatory Visit | Attending: Internal Medicine | Admitting: Internal Medicine

## 2015-05-04 DIAGNOSIS — N281 Cyst of kidney, acquired: Secondary | ICD-10-CM | POA: Insufficient documentation

## 2015-05-04 DIAGNOSIS — R599 Enlarged lymph nodes, unspecified: Secondary | ICD-10-CM | POA: Diagnosis not present

## 2015-05-04 DIAGNOSIS — R918 Other nonspecific abnormal finding of lung field: Secondary | ICD-10-CM | POA: Diagnosis not present

## 2015-05-04 DIAGNOSIS — R590 Localized enlarged lymph nodes: Secondary | ICD-10-CM

## 2015-05-04 DIAGNOSIS — R1084 Generalized abdominal pain: Secondary | ICD-10-CM | POA: Diagnosis present

## 2015-05-04 DIAGNOSIS — I700 Atherosclerosis of aorta: Secondary | ICD-10-CM | POA: Diagnosis not present

## 2015-05-04 LAB — POCT I-STAT CREATININE: Creatinine, Ser: 1.3 mg/dL — ABNORMAL HIGH (ref 0.61–1.24)

## 2015-05-04 MED ORDER — IOHEXOL 300 MG/ML  SOLN
100.0000 mL | Freq: Once | INTRAMUSCULAR | Status: AC | PRN
  Administered 2015-05-04: 100 mL via INTRAVENOUS

## 2015-05-17 DIAGNOSIS — D649 Anemia, unspecified: Secondary | ICD-10-CM | POA: Diagnosis not present

## 2015-05-17 DIAGNOSIS — R634 Abnormal weight loss: Secondary | ICD-10-CM | POA: Diagnosis not present

## 2015-05-17 DIAGNOSIS — R7989 Other specified abnormal findings of blood chemistry: Secondary | ICD-10-CM | POA: Diagnosis not present

## 2015-05-17 DIAGNOSIS — D696 Thrombocytopenia, unspecified: Secondary | ICD-10-CM | POA: Diagnosis not present

## 2015-05-17 DIAGNOSIS — R1013 Epigastric pain: Secondary | ICD-10-CM | POA: Diagnosis not present

## 2015-06-01 DIAGNOSIS — L97521 Non-pressure chronic ulcer of other part of left foot limited to breakdown of skin: Secondary | ICD-10-CM | POA: Diagnosis not present

## 2015-06-16 DIAGNOSIS — R0609 Other forms of dyspnea: Secondary | ICD-10-CM | POA: Diagnosis not present

## 2015-06-16 DIAGNOSIS — I4891 Unspecified atrial fibrillation: Secondary | ICD-10-CM | POA: Diagnosis not present

## 2015-06-17 ENCOUNTER — Encounter: Payer: Self-pay | Admitting: *Deleted

## 2015-06-20 ENCOUNTER — Encounter: Payer: Self-pay | Admitting: Anesthesiology

## 2015-06-20 ENCOUNTER — Ambulatory Visit: Payer: PPO | Admitting: Anesthesiology

## 2015-06-20 ENCOUNTER — Encounter
Admission: RE | Disposition: A | Discharge status: Home / Self Care | Payer: Self-pay | Source: Ambulatory Visit | Attending: Gastroenterology

## 2015-06-20 ENCOUNTER — Ambulatory Visit
Admission: RE | Admit: 2015-06-20 | Discharge: 2015-06-20 | Disposition: A | Discharge status: Home / Self Care | Payer: PPO | Source: Ambulatory Visit | Attending: Gastroenterology | Admitting: Gastroenterology

## 2015-06-20 DIAGNOSIS — Z888 Allergy status to other drugs, medicaments and biological substances status: Secondary | ICD-10-CM | POA: Insufficient documentation

## 2015-06-20 DIAGNOSIS — I509 Heart failure, unspecified: Secondary | ICD-10-CM | POA: Insufficient documentation

## 2015-06-20 DIAGNOSIS — Z951 Presence of aortocoronary bypass graft: Secondary | ICD-10-CM | POA: Diagnosis not present

## 2015-06-20 DIAGNOSIS — Z79899 Other long term (current) drug therapy: Secondary | ICD-10-CM | POA: Diagnosis not present

## 2015-06-20 DIAGNOSIS — K319 Disease of stomach and duodenum, unspecified: Secondary | ICD-10-CM | POA: Diagnosis not present

## 2015-06-20 DIAGNOSIS — K3189 Other diseases of stomach and duodenum: Secondary | ICD-10-CM | POA: Insufficient documentation

## 2015-06-20 DIAGNOSIS — I25119 Atherosclerotic heart disease of native coronary artery with unspecified angina pectoris: Secondary | ICD-10-CM | POA: Diagnosis not present

## 2015-06-20 DIAGNOSIS — R590 Localized enlarged lymph nodes: Secondary | ICD-10-CM | POA: Insufficient documentation

## 2015-06-20 DIAGNOSIS — R634 Abnormal weight loss: Secondary | ICD-10-CM | POA: Insufficient documentation

## 2015-06-20 DIAGNOSIS — K209 Esophagitis, unspecified: Secondary | ICD-10-CM | POA: Diagnosis not present

## 2015-06-20 DIAGNOSIS — K2960 Other gastritis without bleeding: Secondary | ICD-10-CM | POA: Diagnosis not present

## 2015-06-20 DIAGNOSIS — R1013 Epigastric pain: Secondary | ICD-10-CM | POA: Diagnosis not present

## 2015-06-20 DIAGNOSIS — K2950 Unspecified chronic gastritis without bleeding: Secondary | ICD-10-CM | POA: Diagnosis not present

## 2015-06-20 DIAGNOSIS — R05 Cough: Secondary | ICD-10-CM | POA: Insufficient documentation

## 2015-06-20 DIAGNOSIS — R062 Wheezing: Secondary | ICD-10-CM | POA: Diagnosis not present

## 2015-06-20 DIAGNOSIS — I10 Essential (primary) hypertension: Secondary | ICD-10-CM | POA: Diagnosis not present

## 2015-06-20 DIAGNOSIS — I429 Cardiomyopathy, unspecified: Secondary | ICD-10-CM | POA: Insufficient documentation

## 2015-06-20 DIAGNOSIS — R859 Unspecified abnormal finding in specimens from digestive organs and abdominal cavity: Secondary | ICD-10-CM | POA: Diagnosis not present

## 2015-06-20 DIAGNOSIS — Z7982 Long term (current) use of aspirin: Secondary | ICD-10-CM | POA: Diagnosis not present

## 2015-06-20 DIAGNOSIS — R6881 Early satiety: Secondary | ICD-10-CM | POA: Diagnosis not present

## 2015-06-20 DIAGNOSIS — I252 Old myocardial infarction: Secondary | ICD-10-CM | POA: Insufficient documentation

## 2015-06-20 DIAGNOSIS — R1310 Dysphagia, unspecified: Secondary | ICD-10-CM | POA: Diagnosis not present

## 2015-06-20 DIAGNOSIS — K2970 Gastritis, unspecified, without bleeding: Secondary | ICD-10-CM | POA: Diagnosis not present

## 2015-06-20 DIAGNOSIS — Z7951 Long term (current) use of inhaled steroids: Secondary | ICD-10-CM | POA: Insufficient documentation

## 2015-06-20 DIAGNOSIS — K210 Gastro-esophageal reflux disease with esophagitis: Secondary | ICD-10-CM | POA: Insufficient documentation

## 2015-06-20 HISTORY — PX: ESOPHAGOGASTRODUODENOSCOPY (EGD) WITH PROPOFOL: SHX5813

## 2015-06-20 SURGERY — ESOPHAGOGASTRODUODENOSCOPY (EGD) WITH PROPOFOL
Anesthesia: General

## 2015-06-20 MED ORDER — SODIUM CHLORIDE 0.9 % IV SOLN: INTRAVENOUS | Status: DC

## 2015-06-20 MED ORDER — LIDOCAINE HCL (CARDIAC) 20 MG/ML IV SOLN
INTRAVENOUS | Status: DC | PRN
  Administered 2015-06-20: 100 mg via INTRAVENOUS

## 2015-06-20 MED ORDER — GLYCOPYRROLATE 0.2 MG/ML IJ SOLN
INTRAMUSCULAR | Status: DC | PRN
  Administered 2015-06-20: 0.2 mg via INTRAVENOUS

## 2015-06-20 MED ORDER — IPRATROPIUM-ALBUTEROL 0.5-2.5 (3) MG/3ML IN SOLN
3.0000 mL | Freq: Once | RESPIRATORY_TRACT | Status: AC
  Administered 2015-06-20: 3 mL via RESPIRATORY_TRACT

## 2015-06-20 MED ORDER — ETOMIDATE 2 MG/ML IV SOLN
INTRAVENOUS | Status: DC | PRN
  Administered 2015-06-20 (×4): 20 mg via INTRAVENOUS

## 2015-06-20 MED ORDER — PHENYLEPHRINE HCL 10 MG/ML IJ SOLN
INTRAMUSCULAR | Status: DC | PRN
  Administered 2015-06-20: 200 ug via INTRAVENOUS
  Administered 2015-06-20: 100 ug via INTRAVENOUS
  Administered 2015-06-20 (×3): 200 ug via INTRAVENOUS
  Administered 2015-06-20 (×3): 100 ug via INTRAVENOUS
  Administered 2015-06-20: 200 ug via INTRAVENOUS
  Administered 2015-06-20: 100 ug via INTRAVENOUS

## 2015-06-20 MED ORDER — PROPOFOL 10 MG/ML IV BOLUS
INTRAVENOUS | Status: DC | PRN
  Administered 2015-06-20: 30 mg via INTRAVENOUS
  Administered 2015-06-20: 50 mg via INTRAVENOUS
  Administered 2015-06-20: 30 mg via INTRAVENOUS
  Administered 2015-06-20: 40 mg via INTRAVENOUS

## 2015-06-20 MED ORDER — SODIUM CHLORIDE 0.9 % IV SOLN
INTRAVENOUS | Status: DC | PRN
  Administered 2015-06-20: 08:00:00 via INTRAVENOUS

## 2015-06-20 MED ORDER — PROPOFOL 500 MG/50ML IV EMUL
INTRAVENOUS | Status: DC | PRN
  Administered 2015-06-20: 100 ug/kg/min via INTRAVENOUS

## 2015-06-20 MED ORDER — SODIUM CHLORIDE 0.9 % IV SOLN
INTRAVENOUS | Status: DC
  Administered 2015-06-20: 1000 mL via INTRAVENOUS

## 2015-06-20 MED ORDER — IPRATROPIUM-ALBUTEROL 0.5-2.5 (3) MG/3ML IN SOLN
RESPIRATORY_TRACT | Status: AC
  Filled 2015-06-20: qty 3

## 2015-06-20 NOTE — Op Note (Signed)
[facility] Patient Name: Andres Gutierrez Procedure Date: 06/20/2015 7:48 AM MRN: [id number] Account #: [id number] Date of Birth: 10/10/1930 Admit Type: Outpatient Age: 82 Room: [facility] ENDO ROOM 3 Gender: Male Note Status: Finalized Procedure:         Upper GI endoscopy Indications:       Dyspepsia, Weight loss Providers:         Lollie Sails, MD Referring MD:      Rusty Aus, MD (Referring MD) Medicines:         Monitored Anesthesia Care Complications:     No immediate complications. Procedure:         Pre-Anesthesia Assessment:                    - ASA Grade Assessment: III - A patient with severe                     systemic disease.                    After obtaining informed consent, the endoscope was passed                     under direct vision. Throughout the procedure, the                     patient's blood pressure, pulse, and oxygen saturations                     were monitored continuously. The Endoscope was introduced                     through the mouth, and advanced to the fourth part of                     duodenum. The upper GI endoscopy was accomplished without                     difficulty. The patient tolerated the procedure well. Findings:      LA Grade A (one or more mucosal breaks less than 5 mm, not extending       between tops of 2 mucosal folds) esophagitis with no bleeding was found.       Biopsies were taken with a cold forceps for histology.      Diffuse and patchy mild inflammation characterized by congestion (edema)       and erythema was found in the gastric body and in the gastric antrum.       Biopsies were taken with a cold forceps for histology.      The cardia and gastric fundus were normal on retroflexion.      Diffuse and patchy mild mucosal variance characterized by altered       texture was found in the entire duodenum. Biopsies were taken with a       cold forceps for histology. Impression:         - LA Grade A reflux esophagitis. Biopsied.                    - Gastritis. Biopsied.                    - Mucosal variant in the duodenum. Biopsied. Recommendation:    - Discharge patient to home.                    -  Use Protonix (pantoprazole) 40 mg PO BID daily.                    - Use sucralfate tablets 1 gram PO QID.                    - Return to GI clinic in 1 month. Procedure Code(s): --- Professional ---                    [phone], Esophagogastroduodenoscopy, flexible, transoral;                     with biopsy, single or multiple Diagnosis Code(s): --- Professional ---                    K21.0, Gastro-esophageal reflux disease with esophagitis                    K29.70, Gastritis, unspecified, without bleeding                    K31.89, Other diseases of stomach and duodenum                    K30, Functional dyspepsia                    R63.4, Abnormal weight loss CPT copyright 2014 American Medical Association. All rights reserved. The codes documented in this report are preliminary and upon coder review may  be revised to meet current compliance requirements. Lollie Sails, MD 06/20/2015 8:15:29 AM This report has been signed electronically. Number of Addenda: 0 Note Initiated On: 06/20/2015 7:48 AM      [facility]

## 2015-06-20 NOTE — Anesthesia Postprocedure Evaluation (Deleted)
Anesthesia Post Note  Patient: Andres Gutierrez  Procedure(s) Performed: Procedure(s) (LRB): ESOPHAGOGASTRODUODENOSCOPY (EGD) WITH PROPOFOL (N/A)  Patient location during evaluation: Endoscopy Anesthesia Type: General Level of consciousness: awake and alert Pain management: pain level controlled Vital Signs Assessment: post-procedure vital signs reviewed and stable Respiratory status: spontaneous breathing, nonlabored ventilation, respiratory function stable and patient connected to nasal cannula oxygen Cardiovascular status: blood pressure returned to baseline and stable Postop Assessment: no signs of nausea or vomiting Anesthetic complications: no    Last Vitals:  Filed Vitals:   06/20/15 0723 06/20/15 0816  BP: 148/80 82/49  Pulse: 103   Temp: 36.3 C   Resp: 16                   [name]

## 2015-06-20 NOTE — OR Nursing (Signed)
Patients granddaughter asked about patient heart rate.  I had spoken to Dr. Theodore Demark about it on patient admission. Patient had been given duoneb and robinol preop.  But patient granddaughter said that his lopressor had been increased per dr. Sabra Heck while at his appointment Thursday past.  I  Encouraged her to call Dr. Sabra Heck about continued elevated heart rate.  Currently at a steady 115.  Both Drs. Here aware.

## 2015-06-20 NOTE — H&P (Signed)
Outpatient short stay form Pre-procedure 06/20/2015 7:31 AM Lollie Sails MD  Primary Physician: Dr. Emily Filbert  Reason for visit:  EGD  History of present illness:  Patient is a 82 year old male presenting today for EGD. He has been experiencing weight loss and some symptoms of dyspepsia. Has some lower abdominal discomfort however CT scan showed no significant findings. He states he is eating well but there is some early satiety and fullness. He does take a 40 mg Protonix daily. He does take blood thinning medications however has held the [facility] was for about a week. He has continued the 81 mg aspirin daily.    Current facility-administered medications:  .  0.9 %  sodium chloride infusion, , Intravenous, Continuous, Lollie Sails, MD .  0.9 %  sodium chloride infusion, , Intravenous, Continuous, Lollie Sails, MD .  ipratropium-albuterol (DUONEB) 0.5-2.5 (3) MG/3ML nebulizer solution 3 mL, 3 mL, Nebulization, Once, Andria Frames, MD .  ipratropium-albuterol (DUONEB) 0.5-2.5 (3) MG/3ML nebulizer solution, , , ,   Prescriptions prior to admission  Medication Sig Dispense Refill Last Dose  . albuterol (PROVENTIL HFA;VENTOLIN HFA) 108 (90 BASE) MCG/ACT inhaler Inhale 2 puffs into the lungs every 6 (six) hours as needed for wheezing or shortness of breath. 1 Inhaler 2 06/19/2015 at Unknown time  . apixaban (ELIQUIS) 5 MG TABS tablet Take 1 tablet (5 mg total) by mouth 2 (two) times daily. 60 tablet 11 06/19/2015 at Unknown time  . aspirin EC 81 MG tablet Take 81 mg by mouth daily.   06/20/2015 at 0545  . atorvastatin (LIPITOR) 20 MG tablet Take 20 mg by mouth daily.   06/19/2015 at Unknown time  . carvedilol (COREG) 6.25 MG tablet Take 6.25 mg by mouth 2 (two) times daily with a meal.   06/19/2015 at Unknown time  . metoprolol tartrate (LOPRESSOR) 25 MG tablet Take 1 tablet (25 mg total) by mouth 2 (two) times daily. 60 tablet 0 06/19/2015 at Unknown time  . nitroGLYCERIN (NITROSTAT) 0.4  MG SL tablet Place 0.4 mg under the tongue every 5 (five) minutes as needed for chest pain.   Past Month at Unknown time  . pantoprazole (PROTONIX) 40 MG tablet Take 40 mg by mouth daily.   06/19/2015 at Unknown time  . potassium chloride (K-DUR) 10 MEQ tablet Take 10 mEq by mouth 2 (two) times daily.   Past Week at Unknown time  . sucralfate (CARAFATE) 1 G tablet Take 1 g by mouth 4 (four) times daily.   06/19/2015 at Unknown time  . tamsulosin (FLOMAX) 0.4 MG CAPS capsule Take 0.4 mg by mouth daily.   06/19/2015 at Unknown time  . telmisartan (MICARDIS) 80 MG tablet Take 80 mg by mouth daily.   06/19/2015 at Unknown time  . diltiazem (CARDIZEM CD) 120 MG 24 hr capsule Take 1 capsule (120 mg total) by mouth daily.     Marland Kitchen diltiazem (CARDIZEM CD) 180 MG 24 hr capsule Take 180 mg by mouth daily.   12/28/2014 at Unknown time  . predniSONE (DELTASONE) 20 MG tablet Take 2 tablets (40 mg total) by mouth daily with breakfast. (Patient not taking: Reported on 12/31/2014) 10 tablet 0 Not Taking at Unknown time     Allergies  Allergen Reactions  . Benadryl [Diphenhydramine]   . Maxidex [Dexamethasone]   . Prednisone Other (See Comments)    Felt crazy and stopped it  . Vytorin [Ezetimibe-Simvastatin]   . Zocor [Simvastatin]      Past Medical  History  Diagnosis Date  . Hypertension   . MI (myocardial infarction) ([place])     x 2  . Coronary artery disease   . Dysrhythmia   . Cardiomyopathy ([place])   . Lymphadenopathy, hilar   . Wheezing   . Cough     Review of systems:      Physical Exam    Heart and lungs: Irregularly irregular    HEENT: Normocephalic atraumatic eyes are anicteric    Other:     Pertinant exam for procedure: Protuberant, soft mild discomfort in the lower epigastric region. Bowel sounds are positive normoactive there are no masses rebound.    Planned proceedures: EGD and indicated procedures. I have discussed the risks benefits and complications of procedures to include not  limited to bleeding, infection, perforation and the risk of sedation and the patient wishes to proceed.    Lollie Sails, MD Gastroenterology 06/20/2015  7:31 AM

## 2015-06-20 NOTE — Anesthesia Postprocedure Evaluation (Signed)
Anesthesia Post Note  Patient: Andres Gutierrez  Procedure(s) Performed: Procedure(s) (LRB): ESOPHAGOGASTRODUODENOSCOPY (EGD) WITH PROPOFOL (N/A)  Patient location during evaluation: Endoscopy Anesthesia Type: General Level of consciousness: awake and alert Pain management: pain level controlled Vital Signs Assessment: post-procedure vital signs reviewed and stable Respiratory status: spontaneous breathing, nonlabored ventilation, respiratory function stable and patient connected to nasal cannula oxygen Cardiovascular status: blood pressure returned to baseline and stable Postop Assessment: no signs of nausea or vomiting Anesthetic complications: no    Last Vitals:  Filed Vitals:   06/20/15 0856 06/20/15 0906  BP: 131/81 131/88  Pulse: 117 116  Temp:    Resp: 17 25    Last Pain:  Filed Vitals:   06/20/15 0920  PainSc: [place]

## 2015-06-20 NOTE — Transfer of Care (Signed)
Immediate Anesthesia Transfer of Care Note  Patient: Andres Gutierrez  Procedure(s) Performed: Procedure(s): ESOPHAGOGASTRODUODENOSCOPY (EGD) WITH PROPOFOL (N/A)  Patient Location: Endoscopy Unit  Anesthesia Type:General  Level of Consciousness: sedated  Airway & Oxygen Therapy: Patient Spontanous Breathing and Patient connected to nasal cannula oxygen  Post-op Assessment: Report given to RN and Post -op Vital signs reviewed and stable  Post vital signs: Reviewed and stable  Last Vitals:  Filed Vitals:   06/20/15 0723  BP: 148/80  Pulse: 103  Temp: 36.3 C  Resp: 16    Complications: No apparent anesthesia complications

## 2015-06-20 NOTE — Anesthesia Preprocedure Evaluation (Signed)
Anesthesia Evaluation  Patient identified by MRN, date of birth, ID band Patient awake    Reviewed: Allergy & Precautions, H&P , NPO status , Patient's Chart, lab work & pertinent test results  History of Anesthesia Complications Negative for: history of anesthetic complications  Airway Mallampati: III  TM Distance: >3 FB Neck ROM: limited    Dental  (+) Poor Dentition, Chipped, Missing   Pulmonary neg shortness of breath, former smoker,    Pulmonary exam normal breath sounds clear to auscultation       Cardiovascular Exercise Tolerance: Poor hypertension, (-) angina+ CAD, + Past MI, + CABG, +CHF and + DOE  Normal cardiovascular exam+ dysrhythmias  Rhythm:regular Rate:Normal     Neuro/Psych negative neurological ROS  negative psych ROS   GI/Hepatic negative GI ROS, Neg liver ROS, neg GERD  ,  Endo/Other  negative endocrine ROS  Renal/GU negative Renal ROS  negative genitourinary   Musculoskeletal   Abdominal   Peds  Hematology negative hematology ROS (+)   Anesthesia Other Findings Past Medical History:   Hypertension                                                 MI (myocardial infarction) ([place])                               Comment:x 2   Coronary artery disease                                      Dysrhythmia                                                  Cardiomyopathy ([place])                                         Lymphadenopathy, hilar                                       Wheezing                                                     Cough                                                       Past Surgical History:   CORONARY ARTERY BYPASS GRAFT                                  CORONARY ANGIOPLASTY WITH STENT PLACEMENT  ENDOBRONCHIAL ULTRASOUND                        N/A 12/31/2014      Comment:Procedure: ENDOBRONCHIAL ULTRASOUND;  Surgeon:               Flora Lipps, MD;  Location:  [facility] ORS;  Service:              Cardiopulmonary;  Laterality: N/A;   BRONCHIAL NEEDLE ASPIRATION BIOPSY              N/A 12/31/2014      Comment:Procedure: BRONCHIAL NEEDLE ASPIRATION BIOPSIES              from carina;  Surgeon: Flora Lipps, MD;                Location: [facility] ORS;  Service: Cardiopulmonary;               Laterality: N/A;  BMI    Body Mass Index   25.32 kg/m 2      Reproductive/Obstetrics negative OB ROS                             Anesthesia Physical Anesthesia Plan  ASA: IV  Anesthesia Plan: General   Post-op Pain Management:    Induction:   Airway Management Planned:   Additional Equipment:   Intra-op Plan:   Post-operative Plan:   Informed Consent: I have reviewed the patients History and Physical, chart, labs and discussed the procedure including the risks, benefits and alternatives for the proposed anesthesia with the patient or authorized representative who has indicated his/her understanding and acceptance.   Dental Advisory Given  Plan Discussed with: Anesthesiologist, CRNA and Surgeon  Anesthesia Plan Comments: (Patient informed that they are higher risk for complications from anesthesia during this procedure due to their medical history.  Patient voiced understanding. )        Anesthesia Quick Evaluation

## 2015-06-21 ENCOUNTER — Encounter: Payer: Self-pay | Admitting: Gastroenterology

## 2015-06-21 LAB — SURGICAL PATHOLOGY

## 2015-06-22 DIAGNOSIS — L97521 Non-pressure chronic ulcer of other part of left foot limited to breakdown of skin: Secondary | ICD-10-CM | POA: Diagnosis not present

## 2015-06-23 DIAGNOSIS — I4891 Unspecified atrial fibrillation: Secondary | ICD-10-CM | POA: Diagnosis not present

## 2015-06-30 DIAGNOSIS — H43811 Vitreous degeneration, right eye: Secondary | ICD-10-CM | POA: Diagnosis not present

## 2015-07-12 DIAGNOSIS — R05 Cough: Secondary | ICD-10-CM | POA: Diagnosis not present

## 2015-07-13 DIAGNOSIS — R05 Cough: Secondary | ICD-10-CM | POA: Diagnosis not present

## 2015-07-13 DIAGNOSIS — R590 Localized enlarged lymph nodes: Secondary | ICD-10-CM | POA: Diagnosis not present

## 2015-07-13 DIAGNOSIS — J449 Chronic obstructive pulmonary disease, unspecified: Secondary | ICD-10-CM | POA: Diagnosis not present

## 2015-07-13 DIAGNOSIS — I255 Ischemic cardiomyopathy: Secondary | ICD-10-CM | POA: Diagnosis not present

## 2015-07-13 DIAGNOSIS — R0602 Shortness of breath: Secondary | ICD-10-CM | POA: Diagnosis not present

## 2015-07-18 DIAGNOSIS — D649 Anemia, unspecified: Secondary | ICD-10-CM | POA: Diagnosis not present

## 2015-07-18 DIAGNOSIS — R634 Abnormal weight loss: Secondary | ICD-10-CM | POA: Diagnosis not present

## 2015-07-18 DIAGNOSIS — K2950 Unspecified chronic gastritis without bleeding: Secondary | ICD-10-CM | POA: Diagnosis not present

## 2015-07-20 DIAGNOSIS — G609 Hereditary and idiopathic neuropathy, unspecified: Secondary | ICD-10-CM | POA: Diagnosis not present

## 2015-07-20 DIAGNOSIS — L97511 Non-pressure chronic ulcer of other part of right foot limited to breakdown of skin: Secondary | ICD-10-CM | POA: Diagnosis not present

## 2015-07-20 DIAGNOSIS — L97521 Non-pressure chronic ulcer of other part of left foot limited to breakdown of skin: Secondary | ICD-10-CM | POA: Diagnosis not present

## 2015-07-20 DIAGNOSIS — B351 Tinea unguium: Secondary | ICD-10-CM | POA: Diagnosis not present

## 2015-07-27 DIAGNOSIS — Z0000 Encounter for general adult medical examination without abnormal findings: Secondary | ICD-10-CM | POA: Diagnosis not present

## 2015-07-27 DIAGNOSIS — I2510 Atherosclerotic heart disease of native coronary artery without angina pectoris: Secondary | ICD-10-CM | POA: Diagnosis not present

## 2015-07-27 DIAGNOSIS — E538 Deficiency of other specified B group vitamins: Secondary | ICD-10-CM | POA: Diagnosis not present

## 2015-08-02 ENCOUNTER — Inpatient Hospital Stay: Payer: PPO

## 2015-08-02 ENCOUNTER — Inpatient Hospital Stay: Payer: PPO | Attending: Internal Medicine | Admitting: Internal Medicine

## 2015-08-02 ENCOUNTER — Encounter: Payer: Self-pay | Admitting: Internal Medicine

## 2015-08-02 VITALS — BP 114/75 | HR 96 | Temp 98.7°F | Resp 18 | Ht 76.0 in | Wt 216.7 lb

## 2015-08-02 DIAGNOSIS — R5383 Other fatigue: Secondary | ICD-10-CM | POA: Diagnosis not present

## 2015-08-02 DIAGNOSIS — I129 Hypertensive chronic kidney disease with stage 1 through stage 4 chronic kidney disease, or unspecified chronic kidney disease: Secondary | ICD-10-CM | POA: Insufficient documentation

## 2015-08-02 DIAGNOSIS — I429 Cardiomyopathy, unspecified: Secondary | ICD-10-CM | POA: Insufficient documentation

## 2015-08-02 DIAGNOSIS — R062 Wheezing: Secondary | ICD-10-CM | POA: Diagnosis not present

## 2015-08-02 DIAGNOSIS — I252 Old myocardial infarction: Secondary | ICD-10-CM | POA: Diagnosis not present

## 2015-08-02 DIAGNOSIS — Z7901 Long term (current) use of anticoagulants: Secondary | ICD-10-CM | POA: Diagnosis not present

## 2015-08-02 DIAGNOSIS — R05 Cough: Secondary | ICD-10-CM | POA: Diagnosis not present

## 2015-08-02 DIAGNOSIS — I2510 Atherosclerotic heart disease of native coronary artery without angina pectoris: Secondary | ICD-10-CM | POA: Diagnosis not present

## 2015-08-02 DIAGNOSIS — Z79899 Other long term (current) drug therapy: Secondary | ICD-10-CM

## 2015-08-02 DIAGNOSIS — D649 Anemia, unspecified: Secondary | ICD-10-CM | POA: Diagnosis not present

## 2015-08-02 DIAGNOSIS — R590 Localized enlarged lymph nodes: Secondary | ICD-10-CM | POA: Insufficient documentation

## 2015-08-02 DIAGNOSIS — R0602 Shortness of breath: Secondary | ICD-10-CM | POA: Insufficient documentation

## 2015-08-02 DIAGNOSIS — D696 Thrombocytopenia, unspecified: Secondary | ICD-10-CM | POA: Insufficient documentation

## 2015-08-02 DIAGNOSIS — Z7982 Long term (current) use of aspirin: Secondary | ICD-10-CM | POA: Diagnosis not present

## 2015-08-02 DIAGNOSIS — N189 Chronic kidney disease, unspecified: Secondary | ICD-10-CM | POA: Insufficient documentation

## 2015-08-02 DIAGNOSIS — Z87891 Personal history of nicotine dependence: Secondary | ICD-10-CM | POA: Insufficient documentation

## 2015-08-02 LAB — COMPREHENSIVE METABOLIC PANEL
ALT: 12 U/L — AB (ref 17–63)
ANION GAP: 6 (ref 5–15)
AST: 18 U/L (ref 15–41)
Albumin: 4.5 g/dL (ref 3.5–5.0)
Alkaline Phosphatase: 62 U/L (ref 38–126)
BUN: 22 mg/dL — ABNORMAL HIGH (ref 6–20)
CHLORIDE: 107 mmol/L (ref 101–111)
CO2: 21 mmol/L — AB (ref 22–32)
CREATININE: 1.3 mg/dL — AB (ref 0.61–1.24)
Calcium: 8.7 mg/dL — ABNORMAL LOW (ref 8.9–10.3)
GFR, EST AFRICAN AMERICAN: 57 mL/min — AB (ref >60)
GFR, EST NON AFRICAN AMERICAN: 49 mL/min — AB (ref >60)
Glucose, Bld: 114 mg/dL — ABNORMAL HIGH (ref 65–99)
POTASSIUM: 3.6 mmol/L (ref 3.5–5.1)
SODIUM: 134 mmol/L — AB (ref 135–145)
Total Bilirubin: 1.2 mg/dL (ref 0.3–1.2)
Total Protein: 7.5 g/dL (ref 6.5–8.1)

## 2015-08-02 LAB — CBC WITH DIFFERENTIAL/PLATELET
Basophils Absolute: 0.1 10*3/uL (ref 0–0.1)
Basophils Relative: 1 %
EOS ABS: 0 10*3/uL (ref 0–0.7)
HCT: 34.6 % — ABNORMAL LOW (ref 40.0–52.0)
Hemoglobin: 11.4 g/dL — ABNORMAL LOW (ref 13.0–18.0)
LYMPHS ABS: 1.6 10*3/uL (ref 1.0–3.6)
MCH: 30.1 pg (ref 26.0–34.0)
MCHC: 32.9 g/dL (ref 32.0–36.0)
MCV: 91.4 fL (ref 80.0–100.0)
MONO ABS: 1.5 10*3/uL — AB (ref 0.2–1.0)
Neutro Abs: 3.4 10*3/uL (ref 1.4–6.5)
Neutrophils Relative %: 51 %
PLATELETS: 148 10*3/uL — AB (ref 150–440)
RBC: 3.79 MIL/uL — ABNORMAL LOW (ref 4.40–5.90)
RDW: 16.4 % — AB (ref 11.5–14.5)
WBC: 6.7 10*3/uL (ref 3.8–10.6)

## 2015-08-02 LAB — IRON AND TIBC
Iron: 39 ug/dL — ABNORMAL LOW (ref 45–182)
Saturation Ratios: 11 % — ABNORMAL LOW (ref 17.9–39.5)
TIBC: 371 ug/dL (ref 250–450)
UIBC: 332 ug/dL

## 2015-08-02 LAB — LACTATE DEHYDROGENASE: LDH: 194 U/L — ABNORMAL HIGH (ref 98–192)

## 2015-08-02 LAB — FOLATE: FOLATE: 7.6 ng/mL (ref >5.9)

## 2015-08-02 LAB — FERRITIN: FERRITIN: 42 ng/mL (ref 24–336)

## 2015-08-02 NOTE — Progress Notes (Signed)
[place] CONSULT NOTE  Patient Care Team: Rusty Aus, MD as PCP - General (Unknown Physician Specialty)  CHIEF COMPLAINTS/PURPOSE OF CONSULTATION:   # 2012- Anemia 11-12 [Feb 2017- EGD/colo-July 2015 Dr.Skulskie]   # Intermittent thrombocytopenia 120s-140s  # CKD [creatinine 1.2- 1.4]; CAD   HISTORY OF PRESENTING ILLNESS:  Andres Gutierrez 82 y.o.  male history of coronary artery disease/ has been deferred was for further evaluation of anemia. Patient had about 10-15 pounds weight loss in the last 6 months. He had a CT chest abdomen pelvis- that was negative for any acute process. EGD February 2017 negative for any obvious cause of his anemia.  Patient states that his fatigue intermittently. He gets short of breath especially with exertion. Denies any blood in stools black red stools. Denies any nausea vomiting. No abdominal pain. No swelling in the legs. Denies any blood in urine.  ROS: A complete 10 point review of system is done which is negative except mentioned above in history of present illness  MEDICAL HISTORY:  Past Medical History  Diagnosis Date  . Hypertension   . MI (myocardial infarction) ([place])     x 2  . Coronary artery disease   . Dysrhythmia   . Cardiomyopathy ([place])   . Lymphadenopathy, hilar   . Wheezing   . Cough     SURGICAL HISTORY: Past Surgical History  Procedure Laterality Date  . Coronary artery bypass graft    . Coronary angioplasty with stent placement    . Endobronchial ultrasound N/A 12/31/2014    Procedure: ENDOBRONCHIAL ULTRASOUND;  Surgeon: Flora Lipps, MD;  Location: [facility] ORS;  Service: Cardiopulmonary;  Laterality: N/A;  . Bronchial needle aspiration biopsy N/A 12/31/2014    Procedure: BRONCHIAL NEEDLE ASPIRATION BIOPSIES from carina;  Surgeon: Flora Lipps, MD;  Location: [facility] ORS;  Service: Cardiopulmonary;  Laterality: N/A;  . Esophagogastroduodenoscopy (egd) with propofol N/A 06/20/2015    Procedure:  ESOPHAGOGASTRODUODENOSCOPY (EGD) WITH PROPOFOL;  Surgeon: Lollie Sails, MD;  Location: [facility] END[facility]COPY;  Service: Endoscopy;  Laterality: N/A;    SOCIAL HISTORY: lives in [place]; alone. 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  Social History   Social History  . Marital Status: Widowed    Spouse Name: N/A  . Number of Children: N/A  . Years of Education: N/A   Occupational History  . Not on file.   Social History Main Topics  . Smoking status: Former Smoker    Quit date: 12/29/1976  . Smokeless tobacco: Current User    Types: Chew  . Alcohol Use: No     Comment: occational  . Drug Use: No  . Sexual Activity: Not on file   Other Topics Concern  . Not on file   Social History Narrative    FAMILY HISTORY: Family History  Problem Relation Age of Onset  . CAD Mother   . Colon cancer Father     ALLERGIES:  is allergic to escitalopram; maxidex; prednisone; and vytorin.  MEDICATIONS:  Current Outpatient Prescriptions  Medication Sig Dispense Refill  . apixaban (ELIQUIS) 5 MG TABS tablet Take 1 tablet (5 mg total) by mouth 2 (two) times daily. 60 tablet 11  . aspirin EC 81 MG tablet Take 81 mg by mouth daily.    Marland Kitchen atorvastatin (LIPITOR) 20 MG tablet Take 20 mg by mouth daily.    . carvedilol (COREG) 6.25 MG tablet Take 6.25 mg by mouth 2 (two) times daily with a meal.    . metoprolol tartrate (LOPRESSOR)  25 MG tablet Take 1 tablet (25 mg total) by mouth 2 (two) times daily. 60 tablet 0  . nitroGLYCERIN (NITROSTAT) 0.4 MG SL tablet Place 0.4 mg under the tongue every 5 (five) minutes as needed for chest pain.    . pantoprazole (PROTONIX) 40 MG tablet Take 40 mg by mouth 2 (two) times daily.     . potassium chloride (K-DUR) 10 MEQ tablet Take 10 mEq by mouth 2 (two) times daily.    . sucralfate (CARAFATE) 1 G tablet Take 1 g by mouth 4 (four) times daily.    . tamsulosin (FLOMAX) 0.4 MG CAPS capsule Take 0.4 mg by mouth daily.     No current facility-administered medications for this  visit.      Marland Kitchen  PHYSICAL EXAMINATION: ECOG PERFORMANCE STATUS: 0 - Asymptomatic  Filed Vitals:   08/02/15 1422  BP: 114/75  Pulse: 96  Temp: 98.7 F (37.1 C)  Resp: 18   Filed Weights   08/02/15 1422  Weight: 216 lb 11.4 oz (98.3 kg)    GENERAL: Well-nourished well-developed; Alert, no distress and comfortable.   Accompanied by family S: no pallor or icterus OROPHARYNX: no thrush or ulceration;poor dentition.  NECK: supple, no masses felt LYMPH:  no palpable lymphadenopathy in the cervical, axillary or inguinal regions LUNGS: clear to auscultation and  No wheeze or crackles HEART/CVS: regular rate & rhythm and no murmurs; No lower extremity edema ABDOMEN: abdomen soft, non-tender and normal bowel sounds Musculoskeletal:no cyanosis of digits and no clubbing  PSYCH: alert & oriented x 3 with fluent speech NEURO: no focal motor/sensory deficits SKIN:  no rashes or significant lesions  LABORATORY DATA:  I have reviewed the data as listed Lab Results  Component Value Date   WBC 4.9 12/24/2014   HGB 12.0* 12/24/2014   HCT 35.1* 12/24/2014   MCV 96.5 12/24/2014   PLT 117* 12/24/2014    Recent Labs  12/21/14 2047 12/22/14 0417 12/23/14 0620 05/04/15 1314  NA 135 136 134*  --   K 4.8 4.7 3.9  --   CL 105 106 104  --   CO2 21* 24 19*  --   GLUCOSE 108* 103* 136*  --   BUN 31* 27* 23*  --   CREATININE 1.52* 1.40* 1.31* 1.30*  CALCIUM 9.3 8.5* 8.3*  --   GFRNONAA 41* 46* 49*  --   GFRAA 48* 53* 57*  --   PROT 8.0  --   --   --   ALBUMIN 4.5  --   --   --   AST 23  --   --   --   ALT 17  --   --   --   ALKPHOS 65  --   --   --   BILITOT 0.8  --   --   --    ASSESSMENT & PLAN:   # Anemia mild hemoglobin 11-12 [at least since 2012]. The etiology is unclear. Check CBC CMP and LDH and reticulocyte count. Check myeloma workup. Patient's B12 recently was slightly low [277]; recommend repeating the B12/methylmalonic acid. Also recommend checking iron studies and  ferritin.  # Intermittent thrombocytopenia- again unclear etiology. Asymptomatic. CT scan did not show any evidence of cirrhosis or splenomegaly. Await for above workup.  # Patient follow-up with me in approximately 2 weeks to review the above labs/ next plan of care.  Thank you Ms.Landon for allowing me to participate in the care of your pleasant patient. Please do not  hesitate to contact me with questions or concerns in the interim.  # 30 minutes face-to-face with the patient discussing the above plan of care; more than 50% of time spent on counseling and coordination.      Cammie Sickle, MD 08/02/2015 2:32 PM

## 2015-08-02 NOTE — Progress Notes (Signed)
Pt here for anemia. States he has been loosing wt. Does not have appetite at times and things don't taste the same to him. He does not see blood in urine or stool. Pt no longer having the heartburn. He does not report fatigue. He does note that when he walks he does get a little sob but gets better on resting.

## 2015-08-03 DIAGNOSIS — K2210 Ulcer of esophagus without bleeding: Secondary | ICD-10-CM | POA: Insufficient documentation

## 2015-08-03 DIAGNOSIS — E538 Deficiency of other specified B group vitamins: Secondary | ICD-10-CM | POA: Insufficient documentation

## 2015-08-03 DIAGNOSIS — I482 Chronic atrial fibrillation: Secondary | ICD-10-CM | POA: Diagnosis not present

## 2015-08-03 LAB — RETICULOCYTES
RBC.: 3.71 MIL/uL — AB (ref 4.40–5.90)
RETIC CT PCT: 2 % (ref 0.4–3.1)
Retic Count, Absolute: 74.2 10*3/uL (ref 19.0–183.0)

## 2015-08-03 LAB — IMMUNOFIXATION ELECTROPHORESIS
IgA: 234 mg/dL (ref 61–437)
IgG (Immunoglobin G), Serum: 1023 mg/dL (ref 700–1600)
IgM, Serum: 113 mg/dL (ref 15–143)
TOTAL PROTEIN ELP: 6.9 g/dL (ref 6.0–8.5)

## 2015-08-03 LAB — VITAMIN B12: Vitamin B-12: 253 pg/mL (ref 180–914)

## 2015-08-04 LAB — MULTIPLE MYELOMA PANEL, SERUM
ALBUMIN SERPL ELPH-MCNC: 3.9 g/dL (ref 2.9–4.4)
ALPHA 1: 0.3 g/dL (ref 0.0–0.4)
Albumin/Glob SerPl: 1.3 (ref 0.7–1.7)
Alpha2 Glob SerPl Elph-Mcnc: 0.6 g/dL (ref 0.4–1.0)
B-Globulin SerPl Elph-Mcnc: 1 g/dL (ref 0.7–1.3)
GLOBULIN, TOTAL: 3.1 g/dL (ref 2.2–3.9)
Gamma Glob SerPl Elph-Mcnc: 1.1 g/dL (ref 0.4–1.8)
IGA: 232 mg/dL (ref 61–437)
IgG (Immunoglobin G), Serum: 1007 mg/dL (ref 700–1600)
IgM, Serum: 116 mg/dL (ref 15–143)
TOTAL PROTEIN ELP: 7 g/dL (ref 6.0–8.5)

## 2015-08-05 LAB — KAPPA/LAMBDA LIGHT CHAINS
Kappa free light chain: 45.08 mg/L — ABNORMAL HIGH (ref 3.30–19.40)
Kappa, lambda light chain ratio: 2.03 — ABNORMAL HIGH (ref 0.26–1.65)
LAMDA FREE LIGHT CHAINS: 22.22 mg/L (ref 5.71–26.30)

## 2015-08-05 LAB — METHYLMALONIC ACID, SERUM: Methylmalonic Acid, Quantitative: 353 nmol/L (ref 0–378)

## 2015-08-10 DIAGNOSIS — L97521 Non-pressure chronic ulcer of other part of left foot limited to breakdown of skin: Secondary | ICD-10-CM | POA: Diagnosis not present

## 2015-08-11 DIAGNOSIS — E782 Mixed hyperlipidemia: Secondary | ICD-10-CM | POA: Diagnosis not present

## 2015-08-11 DIAGNOSIS — I482 Chronic atrial fibrillation: Secondary | ICD-10-CM | POA: Diagnosis not present

## 2015-08-11 DIAGNOSIS — I25810 Atherosclerosis of coronary artery bypass graft(s) without angina pectoris: Secondary | ICD-10-CM | POA: Diagnosis not present

## 2015-08-11 DIAGNOSIS — I10 Essential (primary) hypertension: Secondary | ICD-10-CM | POA: Diagnosis not present

## 2015-08-11 DIAGNOSIS — I2510 Atherosclerotic heart disease of native coronary artery without angina pectoris: Secondary | ICD-10-CM | POA: Diagnosis not present

## 2015-08-11 DIAGNOSIS — I255 Ischemic cardiomyopathy: Secondary | ICD-10-CM | POA: Diagnosis not present

## 2015-08-16 ENCOUNTER — Inpatient Hospital Stay: Payer: PPO | Attending: Internal Medicine | Admitting: Internal Medicine

## 2015-08-16 VITALS — BP 112/75 | HR 120 | Temp 96.4°F | Resp 18 | Wt 217.8 lb

## 2015-08-16 DIAGNOSIS — Z79899 Other long term (current) drug therapy: Secondary | ICD-10-CM | POA: Diagnosis not present

## 2015-08-16 DIAGNOSIS — D631 Anemia in chronic kidney disease: Secondary | ICD-10-CM | POA: Insufficient documentation

## 2015-08-16 DIAGNOSIS — F419 Anxiety disorder, unspecified: Secondary | ICD-10-CM | POA: Diagnosis not present

## 2015-08-16 DIAGNOSIS — D509 Iron deficiency anemia, unspecified: Secondary | ICD-10-CM | POA: Insufficient documentation

## 2015-08-16 DIAGNOSIS — R05 Cough: Secondary | ICD-10-CM | POA: Diagnosis not present

## 2015-08-16 DIAGNOSIS — N189 Chronic kidney disease, unspecified: Secondary | ICD-10-CM | POA: Diagnosis not present

## 2015-08-16 DIAGNOSIS — Z87891 Personal history of nicotine dependence: Secondary | ICD-10-CM | POA: Insufficient documentation

## 2015-08-16 DIAGNOSIS — R590 Localized enlarged lymph nodes: Secondary | ICD-10-CM | POA: Insufficient documentation

## 2015-08-16 DIAGNOSIS — D696 Thrombocytopenia, unspecified: Secondary | ICD-10-CM | POA: Insufficient documentation

## 2015-08-16 DIAGNOSIS — I252 Old myocardial infarction: Secondary | ICD-10-CM | POA: Insufficient documentation

## 2015-08-16 DIAGNOSIS — I429 Cardiomyopathy, unspecified: Secondary | ICD-10-CM | POA: Diagnosis not present

## 2015-08-16 DIAGNOSIS — Z7982 Long term (current) use of aspirin: Secondary | ICD-10-CM | POA: Insufficient documentation

## 2015-08-16 DIAGNOSIS — Z800 Family history of malignant neoplasm of digestive organs: Secondary | ICD-10-CM | POA: Insufficient documentation

## 2015-08-16 DIAGNOSIS — I2510 Atherosclerotic heart disease of native coronary artery without angina pectoris: Secondary | ICD-10-CM | POA: Diagnosis not present

## 2015-08-16 DIAGNOSIS — Z87442 Personal history of urinary calculi: Secondary | ICD-10-CM | POA: Insufficient documentation

## 2015-08-16 DIAGNOSIS — I129 Hypertensive chronic kidney disease with stage 1 through stage 4 chronic kidney disease, or unspecified chronic kidney disease: Secondary | ICD-10-CM | POA: Diagnosis not present

## 2015-08-16 NOTE — Progress Notes (Signed)
[name]  Patient Care Team: Rusty Aus, MD as PCP - General (Unknown Physician Specialty)  CHIEF COMPLAINTS/PURPOSE OF CONSULTATION:   # 2012- Anemia 11-12- sec to CKD/IDA [March 2017-sat-11%; ferritin-42] [Feb 2017- EGD/colo-July 2015 Dr.Skulskie];April 2017-PO Iron BID  # Intermittent thrombocytopenia 120s-140s [Dec 2016-CT- neg cirrhosis/splenomegaly]  # CKD [creatinine 1.2- 1.4]; CAD   HISTORY OF PRESENTING ILLNESS:  Andres Gutierrez 82 y.o.  male history of above history of chronic anemia and intermittent thrombocytopenia is here to review the results of his blood work.  Patient states that interim he was started on Vitron C and also B12 by his primary care physician. He continues to complain of mild fatigue; otherwise denies any blood in stools or easy bruising.    ROS: A complete 10 point review of system is done which is negative except mentioned above in history of present illness  MEDICAL HISTORY:  Past Medical History  Diagnosis Date  . Hypertension   . MI (myocardial infarction) ([place])     x 2  . Coronary artery disease   . Dysrhythmia   . Cardiomyopathy ([place])   . Lymphadenopathy, hilar   . Wheezing   . Cough   . Anemia   . Anxiety   . Chronic kidney disease     kidney stones    SURGICAL HISTORY: Past Surgical History  Procedure Laterality Date  . Coronary artery bypass graft    . Coronary angioplasty with stent placement    . Endobronchial ultrasound N/A 12/31/2014    Procedure: ENDOBRONCHIAL ULTRASOUND;  Surgeon: Flora Lipps, MD;  Location: [facility] ORS;  Service: Cardiopulmonary;  Laterality: N/A;  . Bronchial needle aspiration biopsy N/A 12/31/2014    Procedure: BRONCHIAL NEEDLE ASPIRATION BIOPSIES from carina;  Surgeon: Flora Lipps, MD;  Location: [facility] ORS;  Service: Cardiopulmonary;  Laterality: N/A;  . Esophagogastroduodenoscopy (egd) with propofol N/A 06/20/2015    Procedure: ESOPHAGOGASTRODUODENOSCOPY (EGD) WITH PROPOFOL;   Surgeon: Lollie Sails, MD;  Location: [facility] ENDOSCOPY;  Service: Endoscopy;  Laterality: N/A;    SOCIAL HISTORY: lives in [place]; alone. 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  Social History   Social History  . Marital Status: Widowed    Spouse Name: N/A  . Number of Children: N/A  . Years of Education: N/A   Occupational History  . Not on file.   Social History Main Topics  . Smoking status: Former Smoker    Quit date: 06/27/1977  . Smokeless tobacco: Current User    Types: Chew  . Alcohol Use: Yes     Comment: occational almost rare once a year  . Drug Use: No  . Sexual Activity: Not on file   Other Topics Concern  . Not on file   Social History Narrative    FAMILY HISTORY: Family History  Problem Relation Age of Onset  . CAD Mother   . Colon cancer Father     ALLERGIES:  is allergic to diphenhydramine hcl; escitalopram; maxidex; prednisone; serotonin; vytorin; and zocor.  MEDICATIONS:  Current Outpatient Prescriptions  Medication Sig Dispense Refill  . apixaban (ELIQUIS) 5 MG TABS tablet Take 1 tablet (5 mg total) by mouth 2 (two) times daily. 60 tablet 11  . aspirin EC 81 MG tablet Take 81 mg by mouth daily.    Marland Kitchen atorvastatin (LIPITOR) 20 MG tablet Take 20 mg by mouth daily.    . carvedilol (COREG) 6.25 MG tablet Take 6.25 mg by mouth 2 (two) times daily with a meal.    .  metoprolol tartrate (LOPRESSOR) 25 MG tablet Take 1 tablet (25 mg total) by mouth 2 (two) times daily. 60 tablet 0  . nitroGLYCERIN (NITROSTAT) 0.4 MG SL tablet Place 0.4 mg under the tongue every 5 (five) minutes as needed for chest pain.    . pantoprazole (PROTONIX) 40 MG tablet Take 40 mg by mouth 2 (two) times daily.     . potassium chloride (K-DUR) 10 MEQ tablet Take 10 mEq by mouth 2 (two) times daily.    . sucralfate (CARAFATE) 1 G tablet Take 1 g by mouth 4 (four) times daily.    . tamsulosin (FLOMAX) 0.4 MG CAPS capsule Take 0.4 mg by mouth daily.     No current facility-administered  medications for this visit.      Marland Kitchen  PHYSICAL EXAMINATION: ECOG PERFORMANCE STATUS: 0 - Asymptomatic  Filed Vitals:   08/16/15 0838  BP: 112/75  Pulse: 120  Temp: 96.4 F (35.8 C)  Resp: 18   Filed Weights   08/16/15 0838  Weight: 217 lb 13 oz (98.8 kg)    GENERAL: Well-nourished well-developed; Alert, no distress and comfortable.   Accompanied by family   LABORATORY DATA:  I have reviewed the data as listed Lab Results  Component Value Date   WBC 6.7 08/02/2015   HGB 11.4* 08/02/2015   HCT 34.6* 08/02/2015   MCV 91.4 08/02/2015   PLT 148* 08/02/2015    Recent Labs  12/21/14 2047 12/22/14 0417 12/23/14 0620 05/04/15 1314 08/02/15 1459  NA 135 136 134*  --  134*  K 4.8 4.7 3.9  --  3.6  CL 105 106 104  --  107  CO2 21* 24 19*  --  21*  GLUCOSE 108* 103* 136*  --  114*  BUN 31* 27* 23*  --  22*  CREATININE 1.52* 1.40* 1.31* 1.30* 1.30*  CALCIUM 9.3 8.5* 8.3*  --  8.7*  GFRNONAA 41* 46* 49*  --  49*  GFRAA 48* 53* 57*  --  57*  PROT 8.0  --   --   --  7.5  ALBUMIN 4.5  --   --   --  4.5  AST 23  --   --   --  18  ALT 17  --   --   --  12*  ALKPHOS 65  --   --   --  62  BILITOT 0.8  --   --   --  1.2   ASSESSMENT & PLAN:   # Anemia mild hemoglobin 11-12 [at least since 2012]. Likely secondary to CKD/iron deficiency [creatinine-1.3/ Ferritin 42 saturation 11%.]. It's quite possible that the patient has low-grade myelodysplastic syndrome- which could also explain his anemia/mild thrombocytopenia- which of course can be diagnosed only on a bone marrow biopsy. As the patient is asymptomatic- I would for now recommend holding off a bone marrow biopsy.  Rest of the anemia workup was fairly unremarkable- SIEP negative/Lambda light chain ratio slightly abnormal at 2.03. B12 methylmalonic acid within normal limits. For now I recommend by mouth iron twice a day. However, if significant gastric discomfort I would recommend IV iron.  # Intermittent thrombocytopenia-  again unclear etiology. Asymptomatic. CT scan did not show any evidence of cirrhosis or splenomegaly. Repeat platelet count 142. Asymptomatic/C discussion above. Monitor for now.   # SIEP negative/Lambda light chain ratio slightly abnormal at 2.03- likely secondary to CKD.   # Patient will follow-up with me in approximately 4 months CBC iron studies and ferritin.  The above plan of care was discussed with the patient and his daughter in detail.  # 15 minutes face-to-face with the patient discussing the above plan of care; more than 50% of time spent on natural history; counseling and coordination.        Cammie Sickle, MD 08/16/2015 8:51 AM

## 2015-09-01 DIAGNOSIS — J40 Bronchitis, not specified as acute or chronic: Secondary | ICD-10-CM | POA: Diagnosis not present

## 2015-09-01 DIAGNOSIS — E860 Dehydration: Secondary | ICD-10-CM | POA: Diagnosis not present

## 2015-09-05 DIAGNOSIS — L97511 Non-pressure chronic ulcer of other part of right foot limited to breakdown of skin: Secondary | ICD-10-CM | POA: Diagnosis not present

## 2015-09-05 DIAGNOSIS — L97521 Non-pressure chronic ulcer of other part of left foot limited to breakdown of skin: Secondary | ICD-10-CM | POA: Diagnosis not present

## 2015-09-05 DIAGNOSIS — G609 Hereditary and idiopathic neuropathy, unspecified: Secondary | ICD-10-CM | POA: Diagnosis not present

## 2015-09-13 DIAGNOSIS — H43811 Vitreous degeneration, right eye: Secondary | ICD-10-CM | POA: Diagnosis not present

## 2015-09-28 ENCOUNTER — Other Ambulatory Visit: Payer: Self-pay | Admitting: Internal Medicine

## 2015-09-28 DIAGNOSIS — R1011 Right upper quadrant pain: Secondary | ICD-10-CM | POA: Diagnosis not present

## 2015-09-28 DIAGNOSIS — R000 Tachycardia, unspecified: Secondary | ICD-10-CM | POA: Diagnosis not present

## 2015-09-28 DIAGNOSIS — I5023 Acute on chronic systolic (congestive) heart failure: Secondary | ICD-10-CM | POA: Diagnosis not present

## 2015-09-28 DIAGNOSIS — I482 Chronic atrial fibrillation: Secondary | ICD-10-CM | POA: Diagnosis not present

## 2015-09-30 ENCOUNTER — Ambulatory Visit
Admission: RE | Admit: 2015-09-30 | Discharge: 2015-09-30 | Disposition: A | Discharge status: Home / Self Care | Payer: PPO | Source: Ambulatory Visit | Attending: Internal Medicine | Admitting: Internal Medicine

## 2015-09-30 DIAGNOSIS — R1011 Right upper quadrant pain: Secondary | ICD-10-CM | POA: Diagnosis not present

## 2015-09-30 DIAGNOSIS — K829 Disease of gallbladder, unspecified: Secondary | ICD-10-CM | POA: Insufficient documentation

## 2015-09-30 DIAGNOSIS — R932 Abnormal findings on diagnostic imaging of liver and biliary tract: Secondary | ICD-10-CM | POA: Insufficient documentation

## 2015-09-30 DIAGNOSIS — K828 Other specified diseases of gallbladder: Secondary | ICD-10-CM | POA: Diagnosis not present

## 2015-10-03 ENCOUNTER — Other Ambulatory Visit: Payer: Self-pay | Admitting: Internal Medicine

## 2015-10-03 DIAGNOSIS — L02612 Cutaneous abscess of left foot: Secondary | ICD-10-CM | POA: Diagnosis not present

## 2015-10-03 DIAGNOSIS — L97521 Non-pressure chronic ulcer of other part of left foot limited to breakdown of skin: Secondary | ICD-10-CM | POA: Diagnosis not present

## 2015-10-03 DIAGNOSIS — G609 Hereditary and idiopathic neuropathy, unspecified: Secondary | ICD-10-CM | POA: Diagnosis not present

## 2015-10-03 DIAGNOSIS — R1011 Right upper quadrant pain: Secondary | ICD-10-CM

## 2015-10-06 DIAGNOSIS — M25561 Pain in right knee: Secondary | ICD-10-CM | POA: Diagnosis not present

## 2015-10-06 DIAGNOSIS — M25562 Pain in left knee: Secondary | ICD-10-CM | POA: Diagnosis not present

## 2015-10-06 DIAGNOSIS — M170 Bilateral primary osteoarthritis of knee: Secondary | ICD-10-CM | POA: Diagnosis not present

## 2015-10-07 ENCOUNTER — Ambulatory Visit
Admission: RE | Admit: 2015-10-07 | Discharge: 2015-10-07 | Disposition: A | Discharge status: Home / Self Care | Payer: PPO | Source: Ambulatory Visit | Attending: Internal Medicine | Admitting: Internal Medicine

## 2015-10-07 DIAGNOSIS — R1011 Right upper quadrant pain: Secondary | ICD-10-CM | POA: Insufficient documentation

## 2015-10-07 MED ORDER — TECHNETIUM TC 99M MEBROFENIN IV KIT
5.0000 | PACK | Freq: Once | INTRAVENOUS | Status: AC | PRN
  Administered 2015-10-07: 5.24 via INTRAVENOUS

## 2015-10-14 DIAGNOSIS — K439 Ventral hernia without obstruction or gangrene: Secondary | ICD-10-CM | POA: Diagnosis not present

## 2015-10-14 DIAGNOSIS — I5023 Acute on chronic systolic (congestive) heart failure: Secondary | ICD-10-CM | POA: Diagnosis not present

## 2015-10-14 DIAGNOSIS — I482 Chronic atrial fibrillation: Secondary | ICD-10-CM | POA: Diagnosis not present

## 2015-10-19 DIAGNOSIS — M542 Cervicalgia: Secondary | ICD-10-CM | POA: Diagnosis not present

## 2015-10-24 DIAGNOSIS — G609 Hereditary and idiopathic neuropathy, unspecified: Secondary | ICD-10-CM | POA: Diagnosis not present

## 2015-10-24 DIAGNOSIS — R1031 Right lower quadrant pain: Secondary | ICD-10-CM | POA: Diagnosis not present

## 2015-10-24 DIAGNOSIS — K219 Gastro-esophageal reflux disease without esophagitis: Secondary | ICD-10-CM | POA: Diagnosis not present

## 2015-10-24 DIAGNOSIS — L97521 Non-pressure chronic ulcer of other part of left foot limited to breakdown of skin: Secondary | ICD-10-CM | POA: Diagnosis not present

## 2015-10-24 DIAGNOSIS — R1032 Left lower quadrant pain: Secondary | ICD-10-CM | POA: Diagnosis not present

## 2015-10-27 DIAGNOSIS — I482 Chronic atrial fibrillation: Secondary | ICD-10-CM | POA: Diagnosis not present

## 2015-10-27 DIAGNOSIS — E538 Deficiency of other specified B group vitamins: Secondary | ICD-10-CM | POA: Diagnosis not present

## 2015-10-27 DIAGNOSIS — K2210 Ulcer of esophagus without bleeding: Secondary | ICD-10-CM | POA: Diagnosis not present

## 2015-10-28 ENCOUNTER — Emergency Department: Payer: PPO

## 2015-10-28 ENCOUNTER — Inpatient Hospital Stay
Admission: EM | Admit: 2015-10-28 | Discharge: 2015-11-01 | DRG: 194 | Disposition: A | Discharge status: Home / Self Care | Payer: PPO | Attending: Internal Medicine | Admitting: Internal Medicine

## 2015-10-28 DIAGNOSIS — R000 Tachycardia, unspecified: Secondary | ICD-10-CM

## 2015-10-28 DIAGNOSIS — I482 Chronic atrial fibrillation, unspecified: Secondary | ICD-10-CM

## 2015-10-28 DIAGNOSIS — K810 Acute cholecystitis: Secondary | ICD-10-CM | POA: Diagnosis not present

## 2015-10-28 DIAGNOSIS — J189 Pneumonia, unspecified organism: Secondary | ICD-10-CM | POA: Diagnosis not present

## 2015-10-28 DIAGNOSIS — I130 Hypertensive heart and chronic kidney disease with heart failure and stage 1 through stage 4 chronic kidney disease, or unspecified chronic kidney disease: Secondary | ICD-10-CM | POA: Diagnosis present

## 2015-10-28 DIAGNOSIS — Z955 Presence of coronary angioplasty implant and graft: Secondary | ICD-10-CM

## 2015-10-28 DIAGNOSIS — Z7982 Long term (current) use of aspirin: Secondary | ICD-10-CM | POA: Diagnosis not present

## 2015-10-28 DIAGNOSIS — I252 Old myocardial infarction: Secondary | ICD-10-CM

## 2015-10-28 DIAGNOSIS — Z8249 Family history of ischemic heart disease and other diseases of the circulatory system: Secondary | ICD-10-CM

## 2015-10-28 DIAGNOSIS — N183 Chronic kidney disease, stage 3 (moderate): Secondary | ICD-10-CM | POA: Diagnosis present

## 2015-10-28 DIAGNOSIS — Z79899 Other long term (current) drug therapy: Secondary | ICD-10-CM

## 2015-10-28 DIAGNOSIS — Z9889 Other specified postprocedural states: Secondary | ICD-10-CM | POA: Diagnosis not present

## 2015-10-28 DIAGNOSIS — D696 Thrombocytopenia, unspecified: Secondary | ICD-10-CM | POA: Diagnosis present

## 2015-10-28 DIAGNOSIS — D692 Other nonthrombocytopenic purpura: Secondary | ICD-10-CM | POA: Diagnosis present

## 2015-10-28 DIAGNOSIS — Z87891 Personal history of nicotine dependence: Secondary | ICD-10-CM | POA: Diagnosis not present

## 2015-10-28 DIAGNOSIS — Z888 Allergy status to other drugs, medicaments and biological substances status: Secondary | ICD-10-CM | POA: Diagnosis not present

## 2015-10-28 DIAGNOSIS — I5022 Chronic systolic (congestive) heart failure: Secondary | ICD-10-CM | POA: Diagnosis not present

## 2015-10-28 DIAGNOSIS — I420 Dilated cardiomyopathy: Secondary | ICD-10-CM | POA: Diagnosis not present

## 2015-10-28 DIAGNOSIS — I5031 Acute diastolic (congestive) heart failure: Secondary | ICD-10-CM | POA: Diagnosis not present

## 2015-10-28 DIAGNOSIS — Z951 Presence of aortocoronary bypass graft: Secondary | ICD-10-CM

## 2015-10-28 DIAGNOSIS — I5020 Unspecified systolic (congestive) heart failure: Secondary | ICD-10-CM | POA: Diagnosis not present

## 2015-10-28 DIAGNOSIS — Z87442 Personal history of urinary calculi: Secondary | ICD-10-CM

## 2015-10-28 DIAGNOSIS — I509 Heart failure, unspecified: Secondary | ICD-10-CM | POA: Diagnosis not present

## 2015-10-28 DIAGNOSIS — K819 Cholecystitis, unspecified: Secondary | ICD-10-CM | POA: Diagnosis present

## 2015-10-28 DIAGNOSIS — N400 Enlarged prostate without lower urinary tract symptoms: Secondary | ICD-10-CM | POA: Diagnosis present

## 2015-10-28 DIAGNOSIS — K828 Other specified diseases of gallbladder: Secondary | ICD-10-CM | POA: Diagnosis not present

## 2015-10-28 DIAGNOSIS — E785 Hyperlipidemia, unspecified: Secondary | ICD-10-CM | POA: Diagnosis present

## 2015-10-28 DIAGNOSIS — I2510 Atherosclerotic heart disease of native coronary artery without angina pectoris: Secondary | ICD-10-CM | POA: Diagnosis not present

## 2015-10-28 DIAGNOSIS — N281 Cyst of kidney, acquired: Secondary | ICD-10-CM | POA: Diagnosis not present

## 2015-10-28 DIAGNOSIS — Z720 Tobacco use: Secondary | ICD-10-CM | POA: Diagnosis not present

## 2015-10-28 DIAGNOSIS — Z800 Family history of malignant neoplasm of digestive organs: Secondary | ICD-10-CM | POA: Diagnosis not present

## 2015-10-28 DIAGNOSIS — D509 Iron deficiency anemia, unspecified: Secondary | ICD-10-CM | POA: Diagnosis present

## 2015-10-28 DIAGNOSIS — R0602 Shortness of breath: Secondary | ICD-10-CM | POA: Diagnosis not present

## 2015-10-28 DIAGNOSIS — R079 Chest pain, unspecified: Secondary | ICD-10-CM | POA: Diagnosis not present

## 2015-10-28 DIAGNOSIS — K219 Gastro-esophageal reflux disease without esophagitis: Secondary | ICD-10-CM | POA: Diagnosis present

## 2015-10-28 DIAGNOSIS — I10 Essential (primary) hypertension: Secondary | ICD-10-CM | POA: Diagnosis not present

## 2015-10-28 DIAGNOSIS — I209 Angina pectoris, unspecified: Secondary | ICD-10-CM | POA: Diagnosis not present

## 2015-10-28 LAB — COMPREHENSIVE METABOLIC PANEL
ALBUMIN: 4.9 g/dL (ref 3.5–5.0)
ALT: 23 U/L (ref 17–63)
ANION GAP: 12 (ref 5–15)
AST: 24 U/L (ref 15–41)
Alkaline Phosphatase: 57 U/L (ref 38–126)
BUN: 32 mg/dL — ABNORMAL HIGH (ref 6–20)
CHLORIDE: 106 mmol/L (ref 101–111)
CO2: 19 mmol/L — AB (ref 22–32)
Calcium: 9.6 mg/dL (ref 8.9–10.3)
Creatinine, Ser: 1.56 mg/dL — ABNORMAL HIGH (ref 0.61–1.24)
GFR calc non Af Amer: 40 mL/min — ABNORMAL LOW (ref >60)
GFR, EST AFRICAN AMERICAN: 46 mL/min — AB (ref >60)
GLUCOSE: 115 mg/dL — AB (ref 65–99)
POTASSIUM: 4.4 mmol/L (ref 3.5–5.1)
SODIUM: 137 mmol/L (ref 135–145)
Total Bilirubin: 1.8 mg/dL — ABNORMAL HIGH (ref 0.3–1.2)
Total Protein: 7.7 g/dL (ref 6.5–8.1)

## 2015-10-28 LAB — CBC WITH DIFFERENTIAL/PLATELET
Basophils Absolute: 0 10*3/uL (ref 0–0.1)
Basophils Relative: 1 %
Eosinophils Absolute: 0 10*3/uL (ref 0–0.7)
Eosinophils Relative: 0 %
HEMATOCRIT: 38 % — AB (ref 40.0–52.0)
HEMOGLOBIN: 12.4 g/dL — AB (ref 13.0–18.0)
LYMPHS ABS: 1.2 10*3/uL (ref 1.0–3.6)
MCH: 31 pg (ref 26.0–34.0)
MCHC: 32.7 g/dL (ref 32.0–36.0)
MCV: 95 fL (ref 80.0–100.0)
Monocytes Absolute: 1.6 10*3/uL — ABNORMAL HIGH (ref 0.2–1.0)
Monocytes Relative: 22 %
NEUTROS ABS: 4.3 10*3/uL (ref 1.4–6.5)
Platelets: 106 10*3/uL — ABNORMAL LOW (ref 150–440)
RBC: 4 MIL/uL — ABNORMAL LOW (ref 4.40–5.90)
RDW: 17.7 % — ABNORMAL HIGH (ref 11.5–14.5)
WBC: 7.1 10*3/uL (ref 3.8–10.6)

## 2015-10-28 LAB — URINALYSIS COMPLETE WITH MICROSCOPIC (ARMC ONLY)
BACTERIA UA: NONE SEEN
Bilirubin Urine: NEGATIVE
Glucose, UA: NEGATIVE mg/dL
Hgb urine dipstick: NEGATIVE
Ketones, ur: NEGATIVE mg/dL
Leukocytes, UA: NEGATIVE
Nitrite: NEGATIVE
PH: 5 (ref 5.0–8.0)
PROTEIN: 30 mg/dL — AB
Specific Gravity, Urine: 1.018 (ref 1.005–1.030)

## 2015-10-28 LAB — TROPONIN I: TROPONIN I: 0.05 ng/mL — AB (ref <0.031)

## 2015-10-28 LAB — LIPASE, BLOOD: Lipase: 52 U/L — ABNORMAL HIGH (ref 11–51)

## 2015-10-28 MED ORDER — PIPERACILLIN-TAZOBACTAM 3.375 G IVPB 30 MIN
3.3750 g | Freq: Once | INTRAVENOUS | Status: AC
  Administered 2015-10-28: 3.375 g via INTRAVENOUS
  Filled 2015-10-28: qty 50

## 2015-10-28 MED ORDER — IOPAMIDOL (ISOVUE-370) INJECTION 76%
75.0000 mL | Freq: Once | INTRAVENOUS | Status: AC | PRN
  Administered 2015-10-28: 75 mL via INTRAVENOUS

## 2015-10-28 MED ORDER — DIATRIZOATE MEGLUMINE & SODIUM 66-10 % PO SOLN
15.0000 mL | Freq: Once | ORAL | Status: AC
  Administered 2015-10-28: 15 mL via ORAL

## 2015-10-28 MED ORDER — SODIUM CHLORIDE 0.9 % IV BOLUS (SEPSIS)
1000.0000 mL | Freq: Once | INTRAVENOUS | Status: AC
  Administered 2015-10-28: 1000 mL via INTRAVENOUS

## 2015-10-28 NOTE — ED Notes (Signed)
Pt states that he has been having chest pain intermittently for the past couple months, pt states he was unable to sleep last night due to the pain and states that the pain is in the left side of chest and in his abd, pt appears pale, pt also states that he is having some sob. Pt denies every needing a blood transfusion in the past

## 2015-10-28 NOTE — ED Notes (Signed)
Patient transported to CT 

## 2015-10-28 NOTE — ED Notes (Signed)
Pt noted to be having more ectopy on the monitor, 3-4 beat run of VTach noted.  MD made aware, pt checked on.  Pt sts he does not feel any worse.

## 2015-10-28 NOTE — ED Provider Notes (Signed)
[facility] Emergency Department Provider Note  ____________________________________________  Time seen: 6:55 PM  I have reviewed the triage vital signs and the nursing notes.   HISTORY  Chief Complaint Chest Pain    HPI Andres Gutierrez is a 82 y.o. male who complains of shortness of breath and dyspnea on exertion over the past 2-3 days. But this is also having some pain in the epigastrium radiating to the left side of the chest. This pain is described as being sharp, intermittent. Not positional. No aggravating or alleviating factors with the chest pain. Moderate intensity. Having some sweats and chills as well but denies fever specifically. No nausea or vomiting. No change in bowel habits. No back pain. No syncope. Eating and drinking normally.     Past Medical History  Diagnosis Date  . Hypertension   . MI (myocardial infarction) ([place])     x 2  . Coronary artery disease   . Dysrhythmia   . Cardiomyopathy ([place])   . Lymphadenopathy, hilar   . Wheezing   . Cough   . Anemia   . Anxiety   . Chronic kidney disease     kidney stones     Patient Active Problem List   Diagnosis Date Noted  . Adenopathy   . Chest pain 12/22/2014  . New onset atrial flutter ([place]) 12/22/2014  . Abnormal CT scan, chest 12/22/2014  . CAD (coronary artery disease) 12/22/2014  . HTN (hypertension) 12/22/2014     Past Surgical History  Procedure Laterality Date  . Coronary artery bypass graft    . Coronary angioplasty with stent placement    . Endobronchial ultrasound N/A 12/31/2014    Procedure: ENDOBRONCHIAL ULTRASOUND;  Surgeon: Flora Lipps, MD;  Location: [facility] ORS;  Service: Cardiopulmonary;  Laterality: N/A;  . Bronchial needle aspiration biopsy N/A 12/31/2014    Procedure: BRONCHIAL NEEDLE ASPIRATION BIOPSIES from [place]ina;  Surgeon: Flora Lipps, MD;  Location: [facility] ORS;  Service: Cardiopulmonary;  Laterality: N/A;  . Esophagogastroduodenoscopy (egd) with propofol  N/A 06/20/2015    Procedure: ESOPHAGOGASTRODUODENOSCOPY (EGD) WITH PROPOFOL;  Surgeon: Lollie Sails, MD;  Location: [facility] ENDOSCOPY;  Service: Endoscopy;  Laterality: N/A;     Current Outpatient Rx  Name  Route  Sig  Dispense  Refill  . apixaban (ELIQUIS) 5 MG TABS tablet   Oral   Take 1 tablet (5 mg total) by mouth 2 (two) times daily.   60 tablet   11   . aspirin EC 81 MG tablet   Oral   Take 81 mg by mouth daily.         Marland Kitchen atorvastatin (LIPITOR) 20 MG tablet   Oral   Take 20 mg by mouth at bedtime.          . dicyclomine (BENTYL) 10 MG capsule   Oral   Take 10 mg by mouth 4 (four) times daily as needed for spasms.         . furosemide (LASIX) 40 MG tablet   Oral   Take 40 mg by mouth 2 (two) times a week. Pt takes on Monday and Friday.         . Iron-Vitamin C (VITRON-C) 65-125 MG TABS   Oral   Take 1 tablet by mouth daily.         . metoprolol tartrate (LOPRESSOR) 25 MG tablet   Oral   Take 1 tablet (25 mg total) by mouth 2 (two) times daily.   60 tablet   0   .  nitroGLYCERIN (NITROSTAT) 0.4 MG SL tablet   Sublingual   Place 0.4 mg under the tongue every 5 (five) minutes as needed for chest pain.         . pantoprazole (PROTONIX) 40 MG tablet   Oral   Take 40 mg by mouth 2 (two) times daily before a meal.          . potassium chloride (K-DUR) 10 MEQ tablet   Oral   Take 10 mEq by mouth 2 (two) times daily.         . ranitidine (ZANTAC) 300 MG tablet   Oral   Take 300 mg by mouth at bedtime.         . senna (SENOKOT) 8.6 MG tablet   Oral   Take 1 tablet by mouth at bedtime as needed for constipation.         . sucralfate (CARAFATE) 1 G tablet   Oral   Take 1 g by mouth 4 (four) times daily -  with meals and at bedtime.          . tamsulosin (FLOMAX) 0.4 MG CAPS capsule   Oral   Take 0.4 mg by mouth daily after supper.             Allergies Diphenhydramine hcl; Escitalopram; Maxidex; Prednisone; Serotonin; Vytorin; and  Zocor   Family History  Problem Relation Age of Onset  . CAD Mother   . Colon cancer Father     Social History Social History  Substance Use Topics  . Smoking status: Former Smoker    Quit date: 06/27/1977  . Smokeless tobacco: Current User    Types: Chew  . Alcohol Use: Yes     Comment: occational almost rare once a year    Review of Systems  Constitutional:   No fever Positive chills.  Eyes:   No vision changes.  ENT:   No sore throat. No rhinorrhea. Cardiovascular:   Positive chest pain. Respiratory:   Positive shortness of breath without cough. Gastrointestinal:   Positive epigastric pain.  Genitourinary:   Negative for dysuria or difficulty urinating. Musculoskeletal:   Negative for focal pain or swelling Neurological:   Negative for headaches 10-point ROS otherwise negative.  ____________________________________________   PHYSICAL EXAM:  VITAL SIGNS: ED Triage Vitals  Enc Vitals Group     BP 10/28/15 1907 148/99 mmHg     Pulse Rate 10/28/15 1907 120     Resp 10/28/15 1907 24     Temp 10/28/15 1907 97.7 F (36.5 C)     Temp Source 10/28/15 1907 Oral     SpO2 10/28/15 1907 95 %     Weight --      Height --      Head Cir --      Peak Flow --      Pain Score --      Pain Loc --      Pain Edu? --      Excl. in [place]? --     Vital signs reviewed, nursing assessments reviewed.   Constitutional:   Alert and oriented. No distress. Eyes:   No scleral icterus. No conjunctival pallor. PERRL. EOMI.  No nystagmus. ENT   Head:   Normocephalic and atraumatic.   Nose:   No congestion/rhinnorhea. No septal hematoma   Mouth/Throat:   Dry mucous membranes, no pharyngeal erythema. No peritonsillar mass.    Neck:   No stridor. No SubQ emphysema. No meningismus. Hematological/Lymphatic/Immunilogical:   No cervical lymphadenopathy.  Cardiovascular:   Irregularly irregular rhythm, tachycardic at 120. Symmetric bilateral radial and DP pulses.  No murmurs.   Respiratory:   Crackles at bilateral bases. Gastrointestinal:   Soft with epigastric and right upper quadrant tenderness. Non distended. There is no CVA tenderness.  No rebound, rigidity, or guarding. Genitourinary:   deferred Musculoskeletal:   Nontender with normal range of motion in all extremities. No joint effusions.  No lower extremity tenderness.  No edema. Neurologic:   Normal speech and language.  CN 2-10 normal. Motor grossly intact. No gross focal neurologic deficits are appreciated.  Skin:    Skin is warm, dry and intact. No rash noted.  No petechiae, purpura, or bullae.  ____________________________________________    LABS (pertinent positives/negatives) (all labs ordered are listed, but only abnormal results are displayed) Labs Reviewed  COMPREHENSIVE METABOLIC PANEL - Abnormal; Notable for the following:    CO2 19 (*)    Glucose, Bld 115 (*)    BUN 32 (*)    Creatinine, Ser 1.56 (*)    Total Bilirubin 1.8 (*)    GFR calc non Af Amer 40 (*)    GFR calc Af Amer 46 (*)    All other components within normal limits  LIPASE, BLOOD - Abnormal; Notable for the following:    Lipase 52 (*)    All other components within normal limits  TROPONIN I - Abnormal; Notable for the following:    Troponin I 0.05 (*)    All other components within normal limits  CBC WITH DIFFERENTIAL/PLATELET - Abnormal; Notable for the following:    RBC 4.00 (*)    Hemoglobin 12.4 (*)    HCT 38.0 (*)    RDW 17.7 (*)    Platelets 106 (*)    Monocytes Absolute 1.6 (*)    All other components within normal limits  URINALYSIS COMPLETEWITH MICROSCOPIC ([facility] ONLY) - Abnormal; Notable for the following:    Color, Urine YELLOW (*)    APPearance CLEAR (*)    Protein, ur 30 (*)    Squamous Epithelial / LPF 0-5 (*)    All other components within normal limits   ____________________________________________   EKG  Interpreted by me Indeterminate rhythm, tachycardic at 121. Normal axis, normal  intervals. Left bundle branch block. Normal ST segments and T waves.  ____________________________________________    RADIOLOGY  CT angiogram chest negative for PE, no evidence of aortic injury. Does show patchy airspace disease consistent with pneumonia and right-sided pleural effusion CT abdomen and pelvis concerning for gallbladder wall thickening with adjacent inflammatory change. Ultrasound right upper quadrant pending ____________________________________________   PROCEDURES   ____________________________________________   INITIAL IMPRESSION / ASSESSMENT AND PLAN / ED COURSE  Pertinent labs & imaging results that were available during my care of the patient were reviewed by me and considered in my medical decision making (see chart for details).  Patient presents with exertional dyspnea and chest pain, low suspicion for dissection. CT negative for PE. Troponin slightly elevated at 0.05 where was previously negative. CT raises suspicion for cholecystitis so we are proceeding with an ultrasound. I'll sign the case out to Dr. Beather Arbour to follow-up on ultrasound with the plan to admit afterward for further monitoring of elevated troponin, tachycardia, and exertional symptoms. I ordered the patient IV Zosyn for presumed community acquired pneumonia but possible intra-abdominal infection. Patient has had 2 L IV saline at this point, pressure stable, mental status normal, not septic.     ____________________________________________   FINAL CLINICAL IMPRESSION(S) /  ED DIAGNOSES  Final diagnoses:  Atypical pneumonia  Chronic atrial fibrillation (HCC)  Tachycardia       Portions of this note were generated with dragon dictation software. Dictation errors may occur despite best attempts at proofreading.   Carrie Mew, MD 10/29/15 [phone]

## 2015-10-29 ENCOUNTER — Inpatient Hospital Stay
Admit: 2015-10-29 | Discharge: 2015-10-29 | Disposition: A | Discharge status: Home / Self Care | Payer: PPO | Attending: Internal Medicine | Admitting: Internal Medicine

## 2015-10-29 ENCOUNTER — Inpatient Hospital Stay: Admit: 2015-10-29 | Payer: PPO

## 2015-10-29 DIAGNOSIS — I420 Dilated cardiomyopathy: Secondary | ICD-10-CM | POA: Diagnosis not present

## 2015-10-29 DIAGNOSIS — Z7982 Long term (current) use of aspirin: Secondary | ICD-10-CM | POA: Diagnosis not present

## 2015-10-29 DIAGNOSIS — K219 Gastro-esophageal reflux disease without esophagitis: Secondary | ICD-10-CM | POA: Diagnosis not present

## 2015-10-29 DIAGNOSIS — I509 Heart failure, unspecified: Secondary | ICD-10-CM | POA: Diagnosis not present

## 2015-10-29 DIAGNOSIS — K810 Acute cholecystitis: Secondary | ICD-10-CM | POA: Diagnosis not present

## 2015-10-29 DIAGNOSIS — I5031 Acute diastolic (congestive) heart failure: Secondary | ICD-10-CM | POA: Diagnosis not present

## 2015-10-29 DIAGNOSIS — R0602 Shortness of breath: Secondary | ICD-10-CM | POA: Diagnosis not present

## 2015-10-29 DIAGNOSIS — I130 Hypertensive heart and chronic kidney disease with heart failure and stage 1 through stage 4 chronic kidney disease, or unspecified chronic kidney disease: Secondary | ICD-10-CM | POA: Diagnosis not present

## 2015-10-29 DIAGNOSIS — N183 Chronic kidney disease, stage 3 (moderate): Secondary | ICD-10-CM | POA: Diagnosis not present

## 2015-10-29 DIAGNOSIS — N400 Enlarged prostate without lower urinary tract symptoms: Secondary | ICD-10-CM | POA: Diagnosis not present

## 2015-10-29 DIAGNOSIS — Z720 Tobacco use: Secondary | ICD-10-CM | POA: Diagnosis not present

## 2015-10-29 DIAGNOSIS — D692 Other nonthrombocytopenic purpura: Secondary | ICD-10-CM | POA: Diagnosis not present

## 2015-10-29 DIAGNOSIS — I482 Chronic atrial fibrillation: Secondary | ICD-10-CM | POA: Diagnosis not present

## 2015-10-29 DIAGNOSIS — I5022 Chronic systolic (congestive) heart failure: Secondary | ICD-10-CM | POA: Diagnosis not present

## 2015-10-29 DIAGNOSIS — I2510 Atherosclerotic heart disease of native coronary artery without angina pectoris: Secondary | ICD-10-CM | POA: Diagnosis not present

## 2015-10-29 DIAGNOSIS — K828 Other specified diseases of gallbladder: Secondary | ICD-10-CM | POA: Diagnosis not present

## 2015-10-29 DIAGNOSIS — I5020 Unspecified systolic (congestive) heart failure: Secondary | ICD-10-CM | POA: Diagnosis not present

## 2015-10-29 DIAGNOSIS — Z9889 Other specified postprocedural states: Secondary | ICD-10-CM | POA: Diagnosis not present

## 2015-10-29 DIAGNOSIS — J189 Pneumonia, unspecified organism: Secondary | ICD-10-CM | POA: Diagnosis not present

## 2015-10-29 DIAGNOSIS — D696 Thrombocytopenia, unspecified: Secondary | ICD-10-CM | POA: Diagnosis not present

## 2015-10-29 DIAGNOSIS — K819 Cholecystitis, unspecified: Secondary | ICD-10-CM | POA: Diagnosis present

## 2015-10-29 DIAGNOSIS — I252 Old myocardial infarction: Secondary | ICD-10-CM | POA: Diagnosis not present

## 2015-10-29 DIAGNOSIS — Z951 Presence of aortocoronary bypass graft: Secondary | ICD-10-CM | POA: Diagnosis not present

## 2015-10-29 DIAGNOSIS — N281 Cyst of kidney, acquired: Secondary | ICD-10-CM | POA: Diagnosis not present

## 2015-10-29 DIAGNOSIS — Z888 Allergy status to other drugs, medicaments and biological substances status: Secondary | ICD-10-CM | POA: Diagnosis not present

## 2015-10-29 DIAGNOSIS — R079 Chest pain, unspecified: Secondary | ICD-10-CM | POA: Diagnosis not present

## 2015-10-29 DIAGNOSIS — Z955 Presence of coronary angioplasty implant and graft: Secondary | ICD-10-CM | POA: Diagnosis not present

## 2015-10-29 DIAGNOSIS — Z79899 Other long term (current) drug therapy: Secondary | ICD-10-CM | POA: Diagnosis not present

## 2015-10-29 DIAGNOSIS — Z87442 Personal history of urinary calculi: Secondary | ICD-10-CM | POA: Diagnosis not present

## 2015-10-29 DIAGNOSIS — E785 Hyperlipidemia, unspecified: Secondary | ICD-10-CM | POA: Diagnosis not present

## 2015-10-29 DIAGNOSIS — I10 Essential (primary) hypertension: Secondary | ICD-10-CM | POA: Diagnosis not present

## 2015-10-29 DIAGNOSIS — Z800 Family history of malignant neoplasm of digestive organs: Secondary | ICD-10-CM | POA: Diagnosis not present

## 2015-10-29 DIAGNOSIS — R000 Tachycardia, unspecified: Secondary | ICD-10-CM | POA: Diagnosis not present

## 2015-10-29 DIAGNOSIS — D509 Iron deficiency anemia, unspecified: Secondary | ICD-10-CM | POA: Diagnosis not present

## 2015-10-29 DIAGNOSIS — Z87891 Personal history of nicotine dependence: Secondary | ICD-10-CM | POA: Diagnosis not present

## 2015-10-29 DIAGNOSIS — Z8249 Family history of ischemic heart disease and other diseases of the circulatory system: Secondary | ICD-10-CM | POA: Diagnosis not present

## 2015-10-29 DIAGNOSIS — I209 Angina pectoris, unspecified: Secondary | ICD-10-CM | POA: Diagnosis not present

## 2015-10-29 HISTORY — DX: Cholecystitis, unspecified: K81.9

## 2015-10-29 LAB — PROTIME-INR
INR: 1.61
Prothrombin Time: 19.2 seconds — ABNORMAL HIGH (ref 11.4–15.0)

## 2015-10-29 LAB — BASIC METABOLIC PANEL
Anion gap: 10 (ref 5–15)
BUN: 31 mg/dL — ABNORMAL HIGH (ref 6–20)
CALCIUM: 8.9 mg/dL (ref 8.9–10.3)
CO2: 21 mmol/L — ABNORMAL LOW (ref 22–32)
CREATININE: 1.52 mg/dL — AB (ref 0.61–1.24)
Chloride: 107 mmol/L (ref 101–111)
GFR, EST AFRICAN AMERICAN: 47 mL/min — AB (ref >60)
GFR, EST NON AFRICAN AMERICAN: 41 mL/min — AB (ref >60)
Glucose, Bld: 117 mg/dL — ABNORMAL HIGH (ref 65–99)
Potassium: 4.6 mmol/L (ref 3.5–5.1)
SODIUM: 138 mmol/L (ref 135–145)

## 2015-10-29 LAB — TROPONIN I
TROPONIN I: 0.04 ng/mL — AB (ref <0.031)
TROPONIN I: 0.05 ng/mL — AB (ref <0.031)
TROPONIN I: 0.03 ng/mL (ref <0.031)

## 2015-10-29 LAB — CBC
HCT: 38.2 % — ABNORMAL LOW (ref 40.0–52.0)
Hemoglobin: 12.5 g/dL — ABNORMAL LOW (ref 13.0–18.0)
MCH: 31.9 pg (ref 26.0–34.0)
MCHC: 32.8 g/dL (ref 32.0–36.0)
MCV: 97.3 fL (ref 80.0–100.0)
PLATELETS: 106 10*3/uL — AB (ref 150–440)
RBC: 3.92 MIL/uL — AB (ref 4.40–5.90)
RDW: 18.6 % — ABNORMAL HIGH (ref 11.5–14.5)
WBC: 9.1 10*3/uL (ref 3.8–10.6)

## 2015-10-29 LAB — APTT
APTT: 38 s — AB (ref 24–36)
aPTT: >160 seconds (ref 24–36)

## 2015-10-29 LAB — HEPARIN LEVEL (UNFRACTIONATED)
Heparin Unfractionated: 2 IU/mL — ABNORMAL HIGH (ref 0.30–0.70)
Heparin Unfractionated: 2 IU/mL — ABNORMAL HIGH (ref 0.30–0.70)

## 2015-10-29 LAB — TSH: TSH: 2.785 u[IU]/mL (ref 0.350–4.500)

## 2015-10-29 MED ORDER — HEPARIN (PORCINE) IN NACL 100-0.45 UNIT/ML-% IJ SOLN
900.0000 [IU]/h | INTRAMUSCULAR | Status: DC
  Administered 2015-10-29: 900 [IU]/h via INTRAVENOUS
  Filled 2015-10-29 (×3): qty 250

## 2015-10-29 MED ORDER — DICYCLOMINE HCL 10 MG PO CAPS
10.0000 mg | ORAL_CAPSULE | Freq: Four times a day (QID) | ORAL | Status: DC | PRN
  Filled 2015-10-29: qty 1

## 2015-10-29 MED ORDER — ZOLPIDEM TARTRATE 5 MG PO TABS: 5.0000 mg | ORAL_TABLET | Freq: Every evening | ORAL | Status: DC | PRN

## 2015-10-29 MED ORDER — HEPARIN (PORCINE) IN NACL 100-0.45 UNIT/ML-% IJ SOLN
1350.0000 [IU]/h | INTRAMUSCULAR | Status: DC
  Administered 2015-10-29: 1350 [IU]/h via INTRAVENOUS
  Filled 2015-10-29: qty 250

## 2015-10-29 MED ORDER — ACETAMINOPHEN 325 MG PO TABS
650.0000 mg | ORAL_TABLET | Freq: Four times a day (QID) | ORAL | Status: DC | PRN
  Administered 2015-10-30 – 2015-11-01 (×2): 650 mg via ORAL
  Filled 2015-10-29 (×2): qty 2

## 2015-10-29 MED ORDER — IRON-VITAMIN C 65-125 MG PO TABS: 1.0000 | ORAL_TABLET | Freq: Every day | ORAL | Status: DC

## 2015-10-29 MED ORDER — FERROUS SULFATE 325 (65 FE) MG PO TABS
325.0000 mg | ORAL_TABLET | Freq: Every day | ORAL | Status: DC
  Administered 2015-10-29 – 2015-11-01 (×4): 325 mg via ORAL
  Filled 2015-10-29 (×4): qty 1

## 2015-10-29 MED ORDER — VITAMIN C 500 MG PO TABS
250.0000 mg | ORAL_TABLET | Freq: Every day | ORAL | Status: DC
  Administered 2015-10-29 – 2015-11-01 (×4): 250 mg via ORAL
  Filled 2015-10-29 (×4): qty 1

## 2015-10-29 MED ORDER — PIPERACILLIN-TAZOBACTAM 3.375 G IVPB 30 MIN
3.3750 g | Freq: Three times a day (TID) | INTRAVENOUS | Status: DC
  Administered 2015-10-29 – 2015-11-01 (×10): 3.375 g via INTRAVENOUS
  Filled 2015-10-29 (×12): qty 50

## 2015-10-29 MED ORDER — FUROSEMIDE 10 MG/ML IJ SOLN
40.0000 mg | Freq: Two times a day (BID) | INTRAMUSCULAR | Status: DC
  Administered 2015-10-29 – 2015-10-30 (×3): 40 mg via INTRAVENOUS
  Filled 2015-10-29 (×3): qty 4

## 2015-10-29 MED ORDER — HEPARIN (PORCINE) IN NACL 100-0.45 UNIT/ML-% IJ SOLN: 12.0000 [IU]/kg/h | INTRAMUSCULAR | Status: DC

## 2015-10-29 MED ORDER — POTASSIUM CHLORIDE CRYS ER 20 MEQ PO TBCR
EXTENDED_RELEASE_TABLET | ORAL | Status: AC
  Administered 2015-10-29: 10 meq via ORAL
  Filled 2015-10-29: qty 1

## 2015-10-29 MED ORDER — NITROGLYCERIN 0.4 MG SL SUBL: 0.4000 mg | SUBLINGUAL_TABLET | SUBLINGUAL | Status: DC | PRN

## 2015-10-29 MED ORDER — METOPROLOL TARTRATE 25 MG PO TABS
ORAL_TABLET | ORAL | Status: AC
  Administered 2015-10-29: 25 mg via ORAL
  Filled 2015-10-29: qty 1

## 2015-10-29 MED ORDER — POTASSIUM CHLORIDE CRYS ER 10 MEQ PO TBCR
10.0000 meq | EXTENDED_RELEASE_TABLET | Freq: Two times a day (BID) | ORAL | Status: DC
  Administered 2015-10-29 – 2015-11-01 (×8): 10 meq via ORAL
  Filled 2015-10-29 (×7): qty 1

## 2015-10-29 MED ORDER — ONDANSETRON HCL 4 MG PO TABS: 4.0000 mg | ORAL_TABLET | Freq: Four times a day (QID) | ORAL | Status: DC | PRN

## 2015-10-29 MED ORDER — METOPROLOL TARTRATE 25 MG PO TABS
25.0000 mg | ORAL_TABLET | Freq: Two times a day (BID) | ORAL | Status: DC
  Administered 2015-10-29 – 2015-10-31 (×7): 25 mg via ORAL
  Filled 2015-10-29 (×6): qty 1

## 2015-10-29 MED ORDER — SENNA 8.6 MG PO TABS: 1.0000 | ORAL_TABLET | Freq: Every evening | ORAL | Status: DC | PRN

## 2015-10-29 MED ORDER — PIPERACILLIN-TAZOBACTAM 3.375 G IVPB
INTRAVENOUS | Status: AC
  Administered 2015-10-29: 3.375 g via INTRAVENOUS
  Filled 2015-10-29: qty 50

## 2015-10-29 MED ORDER — HYDROCODONE-ACETAMINOPHEN 5-325 MG PO TABS: 1.0000 | ORAL_TABLET | ORAL | Status: DC | PRN

## 2015-10-29 MED ORDER — SODIUM CHLORIDE 0.9% FLUSH
3.0000 mL | Freq: Two times a day (BID) | INTRAVENOUS | Status: DC
  Administered 2015-10-29 – 2015-10-31 (×10): 3 mL via INTRAVENOUS

## 2015-10-29 MED ORDER — ATORVASTATIN CALCIUM 20 MG PO TABS
ORAL_TABLET | ORAL | Status: AC
  Administered 2015-10-29: 20 mg via ORAL
  Filled 2015-10-29: qty 1

## 2015-10-29 MED ORDER — ONDANSETRON HCL 4 MG/2ML IJ SOLN
4.0000 mg | Freq: Four times a day (QID) | INTRAMUSCULAR | Status: DC | PRN
  Administered 2015-10-29: 4 mg via INTRAVENOUS
  Filled 2015-10-29: qty 2

## 2015-10-29 MED ORDER — ACETAMINOPHEN 650 MG RE SUPP: 650.0000 mg | Freq: Four times a day (QID) | RECTAL | Status: DC | PRN

## 2015-10-29 MED ORDER — PANTOPRAZOLE SODIUM 40 MG PO TBEC
40.0000 mg | DELAYED_RELEASE_TABLET | Freq: Two times a day (BID) | ORAL | Status: DC
  Administered 2015-10-29 – 2015-11-01 (×7): 40 mg via ORAL
  Filled 2015-10-29 (×7): qty 1

## 2015-10-29 MED ORDER — METOPROLOL TARTRATE 5 MG/5ML IV SOLN: 5.0000 mg | Freq: Four times a day (QID) | INTRAVENOUS | Status: DC | PRN

## 2015-10-29 MED ORDER — SUCRALFATE 1 G PO TABS
1.0000 g | ORAL_TABLET | Freq: Three times a day (TID) | ORAL | Status: DC
  Administered 2015-10-29 – 2015-11-01 (×13): 1 g via ORAL
  Filled 2015-10-29 (×13): qty 1

## 2015-10-29 MED ORDER — MORPHINE SULFATE (PF) 2 MG/ML IV SOLN: 2.0000 mg | INTRAVENOUS | Status: DC | PRN

## 2015-10-29 MED ORDER — HEPARIN BOLUS VIA INFUSION
5000.0000 [IU] | Freq: Once | INTRAVENOUS | Status: AC
  Administered 2015-10-29: 5000 [IU] via INTRAVENOUS
  Filled 2015-10-29: qty 5000

## 2015-10-29 MED ORDER — ATORVASTATIN CALCIUM 20 MG PO TABS
20.0000 mg | ORAL_TABLET | Freq: Every day | ORAL | Status: DC
  Administered 2015-10-29 – 2015-10-31 (×4): 20 mg via ORAL
  Filled 2015-10-29 (×3): qty 1

## 2015-10-29 MED ORDER — TAMSULOSIN HCL 0.4 MG PO CAPS
0.4000 mg | ORAL_CAPSULE | Freq: Every day | ORAL | Status: DC
  Administered 2015-10-29 – 2015-10-31 (×3): 0.4 mg via ORAL
  Filled 2015-10-29 (×3): qty 1

## 2015-10-29 NOTE — H&P (Signed)
[facility] [place] NAME: Andres Gutierrez    MR#:  [id number]  DATE OF BIRTH:  July 20, 1930  DATE OF ADMISSION:  10/28/2015  PRIMARY CARE PHYSICIAN: Rusty Aus, MD   REQUESTING/REFERRING PHYSICIAN: Here physician Dr. Joni Fears  CHIEF COMPLAINT:   Chief Complaint  Patient presents with  . Chest Pain    HISTORY OF PRESENT ILLNESS:  Andres Gutierrez  is a 82 y.o. male with a known history of Coronary artery disease status post MI and CABG, atrial fibrillation currently on [place], iron deficiency anemia, thrombocytopenia, hyperlipidemia patient came in for some intermittent shortness of breath, said it occurs at rest but is also worse with exertion. He said occasionally he has a cough productive of some white sputum, denied fever, chills or sweating. He denied any palpitations, S pain. Normally takes Lasix 40 mg oral twice a day. He's been compliant with medication, he tries to follow a low-sodium diet, has not noticed any increased weight, denied any worsening pedal edema. Chronically has some right lower leg swelling.  As reported some intermittent abdominal pain which she said was in the left lower and right lower quadrant. Michela Pitcher it comes and goes at its worse at 7 out of 10 in severity, fluctuates. He denied any nausea or vomiting. Also has been having some lime green-colored stool worse over the past 2 weeks or so. He has followed up with his gastroenterologist and was instructed to to take what looks like Bentyl as well as a PPI and Carafate. There is no had some mild pre-significant abdominal pain and so family brought him in for further evaluation.  On arrival is been afebrile, does have tachycardia as high as 120, eyes been tachypneic with a respiratory of 24 saturating well on 2 L nasal cannula. Patient had a CBC with a normal white blood cell count at 7, platelet was 106,000. Does have some evidence of senile purpura. Metabolic panel shows an  elevated BUN of 32 and a creatinine of 1.56. Appears to be stable over the past year. He had a troponin was mildly elevated at 0.05. EKG showed sinus tachycardia rate of 121. Patient underwent a CT angiogram of his chest showed moderate right sinus pleural effusion, small left pleural effusion with hazy airspace in the left lower lobe. Worsening bilateral hilar E Estel adenopathy. Cavitary lesion over left apex unchanged in size. No evidence of PE. Also had CT of his abdomen and pelvis which showed gallbladder wall thickening suggestive of acute cholecystitis. Patient underwent a right upper quadrant ultrasound which also showed diffuse gallbladder wall thickening with trace pericholecystic fluid. No gallstones. For the couch was cholecystitis patient given a dose of Zosyn. Shortly thereafter hospital was called to admit. PAST MEDICAL HISTORY:   Past Medical History  Diagnosis Date  . Hypertension   . MI (myocardial infarction) ([place])     x 2  . Coronary artery disease   . Dysrhythmia   . Cardiomyopathy ([place])   . Lymphadenopathy, hilar   . Wheezing   . Cough   . Anemia   . Anxiety   . Chronic kidney disease     kidney stones    PAST SURGICAL HISTORY:   Past Surgical History  Procedure Laterality Date  . Coronary artery bypass graft    . Coronary angioplasty with stent placement    . Endobronchial ultrasound N/A 12/31/2014    Procedure: ENDOBRONCHIAL ULTRASOUND;  Surgeon: Flora Lipps, MD;  Location: [facility] ORS;  Service: Cardiopulmonary;  Laterality: N/A;  . Bronchial needle aspiration biopsy N/A 12/31/2014    Procedure: BRONCHIAL NEEDLE ASPIRATION BIOPSIES from carina;  Surgeon: Flora Lipps, MD;  Location: [facility] ORS;  Service: Cardiopulmonary;  Laterality: N/A;  . Esophagogastroduodenoscopy (egd) with propofol N/A 06/20/2015    Procedure: ESOPHAGOGASTRODUODENOSCOPY (EGD) WITH PROPOFOL;  Surgeon: Lollie Sails, MD;  Location: [facility] ENDOSCOPY;  Service: Endoscopy;  Laterality: N/A;     SOCIAL HISTORY:   Social History  Substance Use Topics  . Smoking status: Former Smoker    Quit date: 06/27/1977  . Smokeless tobacco: Current User    Types: Chew  . Alcohol Use: Yes     Comment: occational almost rare once a year    FAMILY HISTORY:   Family History  Problem Relation Age of Onset  . CAD Mother   . Colon cancer Father     DRUG ALLERGIES:   Allergies  Allergen Reactions  . Diphenhydramine Hcl Other (See Comments)    Reaction:  Unknown   . Escitalopram Other (See Comments)    Reaction:  Makes him feel faint   . Maxidex [Dexamethasone] Other (See Comments)    Reaction:  Unknown   . Prednisone Other (See Comments)    Pt states that this medication made him feel crazy.    . Serotonin Other (See Comments)    Reaction:  Unknown   . Vytorin [Ezetimibe-Simvastatin] Other (See Comments)    Reaction:  Unknown   . Zocor [Simvastatin] Other (See Comments)    Reaction:  Unknown     REVIEW OF SYSTEMS:   ROS  MEDICATIONS AT HOME:   Prior to Admission medications   Medication Sig Start Date End Date Taking? Authorizing Provider  apixaban (ELIQUIS) 5 MG TABS tablet Take 1 tablet (5 mg total) by mouth 2 (two) times daily. 12/23/14  Yes Rusty Aus, MD  aspirin EC 81 MG tablet Take 81 mg by mouth daily.   Yes Historical Provider, MD  atorvastatin (LIPITOR) 20 MG tablet Take 20 mg by mouth at bedtime.    Yes Historical Provider, MD  dicyclomine (BENTYL) 10 MG capsule Take 10 mg by mouth 4 (four) times daily as needed for spasms.   Yes Historical Provider, MD  furosemide (LASIX) 40 MG tablet Take 40 mg by mouth 2 (two) times a week. Pt takes on Monday and Friday.   Yes Historical Provider, MD  Iron-Vitamin C (VITRON-C) 65-125 MG TABS Take 1 tablet by mouth daily.   Yes Historical Provider, MD  metoprolol tartrate (LOPRESSOR) 25 MG tablet Take 1 tablet (25 mg total) by mouth 2 (two) times daily. 12/26/14 12/26/15 Yes Delman Kitten, MD  nitroGLYCERIN (NITROSTAT) 0.4  MG SL tablet Place 0.4 mg under the tongue every 5 (five) minutes as needed for chest pain.   Yes Historical Provider, MD  pantoprazole (PROTONIX) 40 MG tablet Take 40 mg by mouth 2 (two) times daily before a meal.    Yes Historical Provider, MD  potassium chloride (K-DUR) 10 MEQ tablet Take 10 mEq by mouth 2 (two) times daily.   Yes Historical Provider, MD  ranitidine (ZANTAC) 300 MG tablet Take 300 mg by mouth at bedtime.   Yes Historical Provider, MD  senna (SENOKOT) 8.6 MG tablet Take 1 tablet by mouth at bedtime as needed for constipation.   Yes Historical Provider, MD  sucralfate (CARAFATE) 1 G tablet Take 1 g by mouth 4 (four) times daily -  with meals and at bedtime.    Yes  Historical Provider, MD  tamsulosin (FLOMAX) 0.4 MG CAPS capsule Take 0.4 mg by mouth daily after supper.    Yes Historical Provider, MD      VITAL SIGNS:  Blood pressure 111/91, pulse 113, temperature 97.7 F (36.5 C), temperature source Oral, resp. rate 25, SpO2 95 %.  PHYSICAL EXAMINATION:  Physical Exam  GENERAL:  82 y.o.-year-old patient lying in the bed with no acute distress.  EYES: Pupils equal, round, reactive to light and accommodation. No scleral icterus. Extraocular muscles intact.  HEENT: Head atraumatic, normocephalic. Oropharynx and nasopharynx clear.  NECK:  Supple, no jugular venous distention. No thyroid enlargement, no tenderness.  LUNGS: Patient with bilateral crackles in the bases, no appreciable wheezing CARDIOVASCULAR: Tachycardic and regular. No peripheral murmur at this time ABDOMEN: Soft, nondistended. Mildly tender in the, no evidence of Murphy's sign Bowel sounds present. No organomegaly or mass.  EXTREMITIES: No pedal edema, cyanosis, or clubbing.  NEUROLOGIC: Cranial nerves II through XII are intact. Muscle strength 5/5 in all extremities. Sensation intact. Gait not checked.  PSYCHIATRIC: The patient is alert and oriented x 3.  SKIN: No obvious rash, lesion, or ulcer.   LABORATORY  PANEL:   CBC  Recent Labs Lab 10/28/15 1900  WBC 7.1  HGB 12.4*  HCT 38.0*  PLT 106*   ------------------------------------------------------------------------------------------------------------------  Chemistries   Recent Labs Lab 10/28/15 1900  NA 137  K 4.4  CL 106  CO2 19*  GLUCOSE 115*  BUN 32*  CREATININE 1.56*  CALCIUM 9.6  AST 24  ALT 23  ALKPHOS 57  BILITOT 1.8*   ------------------------------------------------------------------------------------------------------------------  Cardiac Enzymes  Recent Labs Lab 10/28/15 1900  TROPONINI 0.05*   ------------------------------------------------------------------------------------------------------------------  RADIOLOGY:  Ct Angio Chest Pe W/cm &/or Wo Cm  10/28/2015  CLINICAL DATA:  Intermittent chest pain the past couple months left-sided and and abdomen. Some shortness-of-breath. EXAM: CT ANGIOGRAPHY CHEST CT ABDOMEN AND PELVIS WITH CONTRAST TECHNIQUE: Multidetector CT imaging of the chest was performed using the standard protocol during bolus administration of intravenous contrast. Multiplanar CT image reconstructions and MIPs were obtained to evaluate the vascular anatomy. Multidetector CT imaging of the abdomen and pelvis was performed using the standard protocol during bolus administration of intravenous contrast. CONTRAST:  75 mL Isovue 370 IV COMPARISON:  None. CT chest, abdomen and pelvis 05/04/2015 and chest CT 12/21/2014. Abdominal/pelvic CT 08/01/2009 FINDINGS: CTA CHEST FINDINGS Lungs are adequately inflated demonstrate interval development of a moderate size right pleural effusion and small left pleural effusion. There is patchy hazy airspace opacification over the left lower lobe which is new. Again noted is a 2.9 cm cavitary lesion over the left apex as the wall is slightly thicker compared to the prior exams although size unchanged. No intracavitary solid component. Airways are within normal. Mild  cardiomegaly. Evidence of previous median sternotomy. There is mild prominence of the pulmonary arteries which may reflect a degree of pulmonary arterial hypertension. No evidence of pulmonary emboli. There is mild worsening bilateral hilar and mediastinal adenopathy. The right hilar lymph node measures 1.6 cm, subcarinal lymph node 1.4 cm and prevascular lymph node 1.3 cm by short axis. Calcified plaque over the thoracic aorta. Remaining mediastinal structures are unremarkable. A few small right pericardial phrenic lymph nodes. CT ABDOMEN and PELVIS FINDINGS Abdominal images demonstrate mild gallbladder wall thickening with ill definition of the adjacent fat which could be seen in acute cholecystitis. Mild fluid adjacent the duodenal C-sweep. The liver, spleen, pancreas and adrenal glands are within normal. Kidneys are normal  in size with several bilateral stable cysts and mild bilateral nephrolithiasis. No evidence of hydronephrosis. Ureters are normal. There is calcified plaque over the abdominal aorta. Stomach is within normal. Small bowel is unremarkable. Appendix is not visualized. Surgical clips are present over the region of the cecum/ascending colon. Pelvic images demonstrate a small amount of free fluid over the midline. The bladder is decompressed. Prostate and rectum are normal. There are degenerative changes throughout the spine. Multilevel disc disease over the lumbar spine. Grade 1 anterolisthesis of L4 on L5. Minimal degenerate change of the hips. 2 cm oval lytic lesion over the left femoral neck unchanged from 2011. Review of the MIP images confirms the above findings. IMPRESSION: Interval development of moderate size right effusion and small left effusion with hazy airspace process over the left lower lobe/ lingular as findings may be due to infection. Worsening bilateral hilar and mediastinal adenopathy as described. Cavity lesion over the left apex unchanged in size although slight interval wall  thickening without intracavitary solid component. Cannot exclude an infectious process such as chronic TB or fungal disease. Neoplasm would be less likely. No evidence of pulmonary embolism. Gallbladder wall thickening with suggestion of adjacent inflammatory change as findings may be due to acute cholecystitis. Minimal adjacent free fluid and minimal fluid over the upper pelvis. Recommend clinical correlation and right upper quadrant ultrasound if indicated. Bilateral renal cysts and nephrolithiasis unchanged. Mild cardiomegaly. Electronically Signed   By: Marin Olp M.D.   On: 10/28/2015 22:03   Ct Abdomen Pelvis W Contrast  10/28/2015  CLINICAL DATA:  Intermittent chest pain the past couple months left-sided and and abdomen. Some shortness-of-breath. EXAM: CT ANGIOGRAPHY CHEST CT ABDOMEN AND PELVIS WITH CONTRAST TECHNIQUE: Multidetector CT imaging of the chest was performed using the standard protocol during bolus administration of intravenous contrast. Multiplanar CT image reconstructions and MIPs were obtained to evaluate the vascular anatomy. Multidetector CT imaging of the abdomen and pelvis was performed using the standard protocol during bolus administration of intravenous contrast. CONTRAST:  75 mL Isovue 370 IV COMPARISON:  None. CT chest, abdomen and pelvis 05/04/2015 and chest CT 12/21/2014. Abdominal/pelvic CT 08/01/2009 FINDINGS: CTA CHEST FINDINGS Lungs are adequately inflated demonstrate interval development of a moderate size right pleural effusion and small left pleural effusion. There is patchy hazy airspace opacification over the left lower lobe which is new. Again noted is a 2.9 cm cavitary lesion over the left apex as the wall is slightly thicker compared to the prior exams although size unchanged. No intracavitary solid component. Airways are within normal. Mild cardiomegaly. Evidence of previous median sternotomy. There is mild prominence of the pulmonary arteries which may reflect a  degree of pulmonary arterial hypertension. No evidence of pulmonary emboli. There is mild worsening bilateral hilar and mediastinal adenopathy. The right hilar lymph node measures 1.6 cm, subcarinal lymph node 1.4 cm and prevascular lymph node 1.3 cm by short axis. Calcified plaque over the thoracic aorta. Remaining mediastinal structures are unremarkable. A few small right pericardial phrenic lymph nodes. CT ABDOMEN and PELVIS FINDINGS Abdominal images demonstrate mild gallbladder wall thickening with ill definition of the adjacent fat which could be seen in acute cholecystitis. Mild fluid adjacent the duodenal C-sweep. The liver, spleen, pancreas and adrenal glands are within normal. Kidneys are normal in size with several bilateral stable cysts and mild bilateral nephrolithiasis. No evidence of hydronephrosis. Ureters are normal. There is calcified plaque over the abdominal aorta. Stomach is within normal. Small bowel is unremarkable. Appendix is  not visualized. Surgical clips are present over the region of the cecum/ascending colon. Pelvic images demonstrate a small amount of free fluid over the midline. The bladder is decompressed. Prostate and rectum are normal. There are degenerative changes throughout the spine. Multilevel disc disease over the lumbar spine. Grade 1 anterolisthesis of L4 on L5. Minimal degenerate change of the hips. 2 cm oval lytic lesion over the left femoral neck unchanged from 2011. Review of the MIP images confirms the above findings. IMPRESSION: Interval development of moderate size right effusion and small left effusion with hazy airspace process over the left lower lobe/ lingular as findings may be due to infection. Worsening bilateral hilar and mediastinal adenopathy as described. Cavity lesion over the left apex unchanged in size although slight interval wall thickening without intracavitary solid component. Cannot exclude an infectious process such as chronic TB or fungal disease.  Neoplasm would be less likely. No evidence of pulmonary embolism. Gallbladder wall thickening with suggestion of adjacent inflammatory change as findings may be due to acute cholecystitis. Minimal adjacent free fluid and minimal fluid over the upper pelvis. Recommend clinical correlation and right upper quadrant ultrasound if indicated. Bilateral renal cysts and nephrolithiasis unchanged. Mild cardiomegaly. Electronically Signed   By: Marin Olp M.D.   On: 10/28/2015 22:03   Dg Chest Portable 1 View  10/28/2015  CLINICAL DATA:  Dyspnea. Intermittent chest pain for months. Left-sided chest and abdominal pain. Shortness of breath. EXAM: PORTABLE CHEST 1 VIEW COMPARISON:  Chest CT 05/04/2015. Radiographs and 07/12/2015 and 02/01/2015 reports, images not available for review. FINDINGS: Patient is post median sternotomy. The heart is enlarged. Small pleural effusions and bibasilar opacities, favor atelectasis. Mild vascular congestion without alveolar edema. No pneumothorax left apical bleb on prior CT not well seen radiographically. Remote left rib fracture. IMPRESSION: 1. Cardiomegaly. Small pleural effusions and probable bibasilar atelectasis. 2. Mild vascular congestion, no alveolar edema. 3. Overall findings may reflect fluid overload. Electronically Signed   By: Jeb Levering M.D.   On: 10/28/2015 19:38   US Abdomen Limited Ruq  10/29/2015  CLINICAL DATA:  Upper abdominal pain for years, shortness of breath. Abnormal gallbladder on CT. EXAM: US ABDOMEN LIMITED - RIGHT UPPER QUADRANT COMPARISON:  CT earlier this day. FINDINGS: Gallbladder: Physiologically distended. No gallstones. Diffuse gallbladder wall thickening measuring 5-6 mm. Trace pericholecystic fluid. No sonographic Murphy sign noted by sonographer. Common bile duct: Diameter: 2 mm. Liver: No focal lesion identified. Within normal limits in parenchymal echogenicity. Normal directional flow in the main portal vein. Incidental: Right pleural  effusion.  Right renal cysts. IMPRESSION: Diffuse gallbladder wall thickening with trace pericholecystic fluid. No gallstones. Findings may reflect acalculus cholecystitis, acute or chronic versus sequela of liver disease. No biliary dilatation. Electronically Signed   By: Jeb Levering M.D.   On: 10/29/2015 00:10      IMPRESSION AND PLAN:   82 year old male admitted for mildly elevated troponin, acalculus cholecystitis.  1. Acalculous cholecystitis. Patient complaining of left lower quadrant pain no real significant right upper quadrant. He had evidence of inflammation of the gallbladder on both ultrasound and CT of his abdomen pelvis. I was started on Zosyn, will continue for now. He has a white blood cell count. This may be chronic. We'll consult general surgery for further input. Hemoglobin liquid diet for now, we'll hold his SWITCH to heparin drip given his elevated renal function as well. Patient followed with GI this past weekend was instructed to take Bentyl 4 times a day for spasm,  also on PPI Protonix 40 mg twice a day and Carafate 1 g daily.  2. Congestive heart failure. Patient with evidence of right-sided pleural effusion, has crackles in the base of his lungs, chest x-ray shows mild vascular congestion. Will switch his Lasix oral to IV 40 mg twice a day. Check intake and output as well as daily weight. Troponin mildly elevated at 0.05 but is not complaining of any chest pain. Does have some intermittent shortness of breath. We'll follow-up with an echocardiogram. It was enzymes. Check TSH  3. GERD. Continue Protonix 40 mg twice a day and Carafate  4. Iron deficiency anemia. Follows with hematology. Continue with daily iron and vitamin C  5. Thermal cytopenia. Follows with hematology. Monitor  6. Atrial fibrillation. Heart rate is elevated, we'll give him some IV metoprolol and continue his home dose of 25 twice a day. Monitor on telemetry as above. L Loistine Simas is being switched to  heparin for now.  7. BPH. Continue 0.4 mg daily  8. DVT prophylaxis. Heparin drip  Case discussed with patient's daughter and son-in-law at the bedside.  All the records are reviewed and case discussed with ED provider. Management plans discussed with the patient, family and they are in agreement.  CODE STATUS: Full  TOTAL TIME TAKING CARE OF THIS PATIENT: 40 minutes.    Alesia Richards M.D on 10/29/2015 at 1:10 AM  Between 7am to 6pm - Pager - [phone]  After 6pm go to [email or web address] - Proofreader  [facility]  Office  [phone]  CC: Primary care physician; Rusty Aus, MD   Note: This dictation was prepared with Dragon dictation along with smaller phrase technology. Any transcriptional errors that result from this process are unintentional.

## 2015-10-29 NOTE — Progress Notes (Signed)
ANTICOAGULATION CONSULT NOTE - Initial Consult  Pharmacy Consult for heparin drip Indication: atrial fibrillation  Allergies  Allergen Reactions  . Diphenhydramine Hcl Other (See Comments)    Reaction:  Unknown   . Escitalopram Other (See Comments)    Reaction:  Makes him feel faint   . Maxidex [Dexamethasone] Other (See Comments)    Reaction:  Unknown   . Prednisone Other (See Comments)    Pt states that this medication made him feel crazy.    . Serotonin Other (See Comments)    Reaction:  Unknown   . Vytorin [Ezetimibe-Simvastatin] Other (See Comments)    Reaction:  Unknown   . Zocor [Simvastatin] Other (See Comments)    Reaction:  Unknown     Patient Measurements: Height: 6\' 4"  (193 cm) Weight: 209 lb 12.8 oz (95.165 kg) IBW/kg (Calculated) : 86.8 Heparin Dosing Weight: 95.2kg  Vital Signs: Temp: 97.4 F (36.3 C) (06/17 1131) Temp Source: Oral (06/17 1131) BP: 113/79 mmHg (06/17 1131) Pulse Rate: 109 (06/17 1131)  Labs:  Recent Labs  10/28/15 1900 10/29/15 0204 10/29/15 0648 10/29/15 1025  HGB 12.4*  --  12.5*  --   HCT 38.0*  --  38.2*  --   PLT 106*  --  106*  --   APTT  --  38*  --  >160*  LABPROT  --  19.2*  --   --   INR  --  1.61  --   --   HEPARINUNFRC  --  2.00*  --  2.00*  CREATININE 1.56*  --  1.52*  --   TROPONINI 0.05* 0.04* 0.05*  --     Estimated Creatinine Clearance: 46 mL/min (by C-G formula based on Cr of 1.52).   Medications:  On eliquis as outpatient, last dose 6/16 at 0800  Assessment:  Goal of Therapy:  Heparin level 0.3-0.7 units/ml  aPTT goal  66-102. Monitor platelets by anticoagulation protocol: Yes   Plan:  Will follow and adjust by aPTT until correlation with heparin level. Current orders for heparin 1350 units/hr  APTT > 160. Spoke with RN, will stop heparin infusion x 1 hour. Resume at 1150 units/hr and recheck 6 hours after resumption  CBC and HL with AM labs    Omeed Osuna C 10/29/2015,12:27 PM

## 2015-10-29 NOTE — Progress Notes (Signed)
ANTICOAGULATION CONSULT NOTE - Initial Consult  Pharmacy Consult for heparin drip Indication: atrial fibrillation  Allergies  Allergen Reactions  . Diphenhydramine Hcl Other (See Comments)    Reaction:  Unknown   . Escitalopram Other (See Comments)    Reaction:  Makes him feel faint   . Maxidex [Dexamethasone] Other (See Comments)    Reaction:  Unknown   . Prednisone Other (See Comments)    Pt states that this medication made him feel crazy.    . Serotonin Other (See Comments)    Reaction:  Unknown   . Vytorin [Ezetimibe-Simvastatin] Other (See Comments)    Reaction:  Unknown   . Zocor [Simvastatin] Other (See Comments)    Reaction:  Unknown     Patient Measurements: Height: 6\' 4"  (193 cm) Weight: 209 lb 12.8 oz (95.165 kg) IBW/kg (Calculated) : 86.8 Heparin Dosing Weight: 95.2kg  Vital Signs: Temp: 97.6 F (36.4 C) (06/17 1923) Temp Source: Oral (06/17 1923) BP: 122/79 mmHg (06/17 1923) Pulse Rate: 115 (06/17 1923)  Labs:  Recent Labs  10/28/15 1900 10/29/15 0204 10/29/15 0648 10/29/15 1025 10/29/15 1251 10/29/15 1919  HGB 12.4*  --  12.5*  --   --   --   HCT 38.0*  --  38.2*  --   --   --   PLT 106*  --  106*  --   --   --   APTT  --  38*  --  >160*  --  >160*  LABPROT  --  19.2*  --   --   --   --   INR  --  1.61  --   --   --   --   HEPARINUNFRC  --  2.00*  --  2.00*  --   --   CREATININE 1.56*  --  1.52*  --   --   --   TROPONINI 0.05* 0.04* 0.05*  --  0.03  --     Estimated Creatinine Clearance: 46 mL/min (by C-G formula based on Cr of 1.52).   Medications:  On eliquis as outpatient, last dose 6/16 at 0800  Assessment:  Goal of Therapy:  Heparin level 0.3-0.7 units/ml  aPTT goal  66-102. Monitor platelets by anticoagulation protocol: Yes   Plan:  Will follow and adjust by aPTT until correlation with heparin level. Current orders for heparin 1100 units/hr  APTT > 160. Spoke with RN, will stop heparin infusion x 1 hour. Resume at 900  units/hr and recheck 6 hours after resumption  CBC and HL with AM labs    Haim Hansson D Thorne Wirz, Pharm.D Clinical Pharmacist   10/29/2015,8:15 PM

## 2015-10-29 NOTE — ED Notes (Signed)
Admitting MD at bedside.

## 2015-10-29 NOTE — Consult Note (Signed)
Patient ID: Andres Gutierrez, male   DOB: 01-04-1931, 82 y.o.   MRN: [id number]  HPI Andres Gutierrez is a 82 y.o. male with a known history of Coronary artery disease status post MI and CABG, atrial fibrillation currently on eloquis, iron deficiency anemia, thrombocytopenia, hyperlipidemia patient came in for some intermittent shortness of breath, said it occurs at rest but is also worse with exertion. Also reports some left upper quadrant and epigastric abdominal pain. This pain is intermittent moderate in severity and is have for a few weeks. Normal appetite. Of note he has been worked As an outpatient for gallbladder issues and they have a HIDA scan with a normal ejection fraction and no evidence of cholecystitis. He also had an EGD that was not significant. A significant history of chronic heart failure with an ejection fraction of 25%. Spur of the workup a CT scan was performed as well as an ultrasound.  Those studies were personally reviewed. The main finding  is the significant degree of cardiomegaly and congestive PA as well as the inferior vena cava, and a massive right pleural effusion suggesting an acute exacerbation of heart failure, ultrasound does not show any evidence of stones normal common bile duct and some thickening of the gallbladder wall. Pending ECHO now The previous abdominal surgical history is consistent with a lap colectomy several years ago.   HPI  Past Medical History  Diagnosis Date  . Hypertension   . MI (myocardial infarction) ([place])     x 2  . Coronary artery disease   . Dysrhythmia   . Cardiomyopathy ([place])   . Lymphadenopathy, hilar   . Wheezing   . Cough   . Anemia   . Anxiety   . Chronic kidney disease     kidney stones    Past Surgical History  Procedure Laterality Date  . Coronary artery bypass graft    . Coronary angioplasty with stent placement    . Endobronchial ultrasound N/A 12/31/2014    Procedure: ENDOBRONCHIAL ULTRASOUND;  Surgeon: Flora Lipps, MD;   Location: [facility] ORS;  Service: Cardiopulmonary;  Laterality: N/A;  . Bronchial needle aspiration biopsy N/A 12/31/2014    Procedure: BRONCHIAL NEEDLE ASPIRATION BIOPSIES from carina;  Surgeon: Flora Lipps, MD;  Location: [facility] ORS;  Service: Cardiopulmonary;  Laterality: N/A;  . Esophagogastroduodenoscopy (egd) with propofol N/A 06/20/2015    Procedure: ESOPHAGOGASTRODUODENOSCOPY (EGD) WITH PROPOFOL;  Surgeon: Lollie Sails, MD;  Location: [facility] ENDOSCOPY;  Service: Endoscopy;  Laterality: N/A;    Family History  Problem Relation Age of Onset  . CAD Mother   . Colon cancer Father     Social History Social History  Substance Use Topics  . Smoking status: Former Smoker    Quit date: 06/27/1977  . Smokeless tobacco: Current User    Types: Chew  . Alcohol Use: Yes     Comment: occational almost rare once a year    Allergies  Allergen Reactions  . Diphenhydramine Hcl Other (See Comments)    Reaction:  Unknown   . Escitalopram Other (See Comments)    Reaction:  Makes him feel faint   . Maxidex [Dexamethasone] Other (See Comments)    Reaction:  Unknown   . Prednisone Other (See Comments)    Pt states that this medication made him feel crazy.    . Serotonin Other (See Comments)    Reaction:  Unknown   . Vytorin [Ezetimibe-Simvastatin] Other (See Comments)    Reaction:  Unknown   . Zocor [  Simvastatin] Other (See Comments)    Reaction:  Unknown     Current Facility-Administered Medications  Medication Dose Route Frequency Provider Last Rate Last Dose  . acetaminophen (TYLENOL) tablet 650 mg  650 mg Oral Q6H PRN Alesia Richards, MD       Or  . acetaminophen (TYLENOL) suppository 650 mg  650 mg Rectal Q6H PRN Alesia Richards, MD      . atorvastatin (LIPITOR) tablet 20 mg  20 mg Oral QHS Alesia Richards, MD   20 mg at 10/29/15 0131  . dicyclomine (BENTYL) capsule 10 mg  10 mg Oral QID PRN Alesia Richards, MD      . ferrous sulfate tablet 325 mg  325 mg Oral Daily Alesia Richards, MD    325 mg at 10/29/15 0933  . furosemide (LASIX) injection 40 mg  40 mg Intravenous BID Alesia Richards, MD   40 mg at 10/29/15 0900  . heparin ADULT infusion 100 units/mL (25000 units/259mL sodium chloride 0.45%)  1,350 Units/hr Intravenous Continuous Harrie Foreman, MD 13.5 mL/hr at 10/29/15 0439 1,350 Units/hr at 10/29/15 0439  . HYDROcodone-acetaminophen (NORCO/VICODIN) 5-325 MG per tablet 1-2 tablet  1-2 tablet Oral Q4H PRN Alesia Richards, MD      . metoprolol (LOPRESSOR) injection 5 mg  5 mg Intravenous Q6H PRN Alesia Richards, MD      . metoprolol tartrate (LOPRESSOR) tablet 25 mg  25 mg Oral BID Alesia Richards, MD   25 mg at 10/29/15 0934  . morphine 2 MG/ML injection 2 mg  2 mg Intravenous Q4H PRN Alesia Richards, MD      . nitroGLYCERIN (NITROSTAT) SL tablet 0.4 mg  0.4 mg Sublingual Q5 min PRN Alesia Richards, MD      . ondansetron [facility]) tablet 4 mg  4 mg Oral Q6H PRN Alesia Richards, MD       Or  . ondansetron [facility]) injection 4 mg  4 mg Intravenous Q6H PRN Alesia Richards, MD      . pantoprazole (PROTONIX) EC tablet 40 mg  40 mg Oral BID AC Alesia Richards, MD   40 mg at 10/29/15 0900  . piperacillin-tazobactam (ZOSYN) IVPB 3.375 g  3.375 g Intravenous Q8H Alesia Richards, MD 12.5 mL/hr at 10/29/15 0614 3.375 g at 10/29/15 0614  . potassium chloride (K-DUR,KLOR-CON) CR tablet 10 mEq  10 mEq Oral BID Alesia Richards, MD   10 mEq at 10/29/15 0933  . senna (SENOKOT) tablet 8.6 mg  1 tablet Oral QHS PRN Alesia Richards, MD      . sodium chloride flush (NS) 0.9 % injection 3 mL  3 mL Intravenous Q12H Alesia Richards, MD   3 mL at 10/29/15 0934  . sucralfate (CARAFATE) tablet 1 g  1 g Oral TID WC & HS Alesia Richards, MD   1 g at 10/29/15 0900  . tamsulosin (FLOMAX) capsule 0.4 mg  0.4 mg Oral QPC supper Alesia Richards, MD      . vitamin C (ASCORBIC ACID) tablet 250 mg  250 mg Oral Daily Alesia Richards, MD   250 mg at 10/29/15 0934  . zolpidem (AMBIEN) tablet 5 mg  5 mg Oral QHS PRN Alesia Richards, MD         Review of Systems A 10 point review of systems was asked and was negative except for the information on the HPI  Physical Exam Blood pressure 109/83, pulse 100, temperature 97.7 F (36.5 C), temperature source Oral, resp. rate 18, height 6\' 4"  (1.93 m), weight 95.165  kg (209 lb 12.8 oz), SpO2 97 %. CONSTITUTIONAL: NAD. EYES: Pupils are equal, round, and reactive to light, Sclera are non-icteric. EARS, NOSE, MOUTH AND THROAT: The oropharynx is clear. The oral mucosa is pink and moist. Hearing is intact to voice. LYMPH NODES:  Lymph nodes in the neck are normal. RESPIRATORY:  Lungs are clear. There is normal respiratory effort, with equal breath sounds bilaterally, and without pathologic use of accessory muscles. CARDIOVASCULAR: Heart is iregular without murmurs, gallops, or rubs. GI: The abdomen is  soft, nontender, and nondistended. There are no palpable masses. There is no hepatosplenomegaly. There are normal bowel sounds in all quadrants. GU: Rectal deferred.   MUSCULOSKELETAL: Normal muscle strength and tone. No cyanosis or edema.   SKIN: Turgor is good and there are no pathologic skin lesions or ulcers. NEUROLOGIC: Motor and sensation is grossly normal. Cranial nerves are grossly intact. PSYCH:  Oriented to person, place and time. Affect is normal.  Data Reviewed I have personally reviewed the patient's imaging, laboratory findings and medical records.    Assessment/Plan  Dyspnea and some vague left-sided abdominal pain. Clinically not consistent with gallbladder disease. Only finding suggestive of cholecystitis is a radiographic finding. He really address HIDA scan that he was negative. There is no evidence of stones. What is more concerning is his acute exacerbation of his heart failure with significant pleural effusion and pulmonary congestion. I will order a HIDA scan to prove that this is not cholecystitis. All of his knee with his heart failure U can see some  congestion around the gallbladder that may mimic radiographic findings. I think putting it all together disease acute exacerbation of his heart failure and not acute cholecystitis. We will continue to follow him of his leaking no surgical indication at this time and recommend continuation of medical management. I have personally reviewed all the available medical records and have his stent extensive time reviewing all his records. Discussed with the patient in detail about the plan and he understands. Definitely need an cardiac clearance before any surgical procedure might be attempted.    Caroleen Hamman, MD FACS General Surgeon 10/29/2015, 10:57 AM

## 2015-10-29 NOTE — Progress Notes (Signed)
*  PRELIMINARY RESULTS* Echocardiogram 2D Echocardiogram has been performed.  Andres Gutierrez 10/29/2015, 5:02 PM

## 2015-10-29 NOTE — Progress Notes (Signed)
[facility] at [place] NAME: Christophor Amado    MR#:  [id number]  DATE OF BIRTH:  Oct 19, 1930  SUBJECTIVE: Admitted this morning because of abdominal pain and found to have acalculous cholecystitis. Patient still has abdominal pain and also complains of shortness of breath today.   CHIEF COMPLAINT:   Chief Complaint  Patient presents with  . Chest Pain    REVIEW OF SYSTEMS:    Review of Systems  Constitutional: Negative for fever and chills.  HENT: Negative for hearing loss.   Eyes: Negative for blurred vision, double vision and photophobia.  Respiratory: Positive for shortness of breath. Negative for cough and hemoptysis.   Cardiovascular: Negative for palpitations, orthopnea and leg swelling.  Gastrointestinal: Positive for abdominal pain. Negative for nausea, vomiting and diarrhea.       Right upper quadrant abdominal pain present  Genitourinary: Negative for dysuria and urgency.  Musculoskeletal: Negative for myalgias and neck pain.  Skin: Negative for rash.  Neurological: Negative for dizziness, focal weakness, seizures, weakness and headaches.  Psychiatric/Behavioral: Negative for memory loss. The patient does not have insomnia.     Nutrition:  Tolerating Diet: Tolerating PT:      DRUG ALLERGIES:   Allergies  Allergen Reactions  . Diphenhydramine Hcl Other (See Comments)    Reaction:  Unknown   . Escitalopram Other (See Comments)    Reaction:  Makes him feel faint   . Maxidex [Dexamethasone] Other (See Comments)    Reaction:  Unknown   . Prednisone Other (See Comments)    Pt states that this medication made him feel crazy.    . Serotonin Other (See Comments)    Reaction:  Unknown   . Vytorin [Ezetimibe-Simvastatin] Other (See Comments)    Reaction:  Unknown   . Zocor [Simvastatin] Other (See Comments)    Reaction:  Unknown     VITALS:  Blood pressure 109/83, pulse 100, temperature 97.7 F (36.5 C), temperature  source Oral, resp. rate 18, height 6\' 4"  (1.93 m), weight 95.165 kg (209 lb 12.8 oz), SpO2 97 %.  PHYSICAL EXAMINATION:   Physical Exam  GENERAL:  82 y.o.-year-old patient lying in the bed with no acute distress.  EYES: Pupils equal, round, reactive to light and accommodation. No scleral icterus. Extraocular muscles intact.  HEENT: Head atraumatic, normocephalic. Oropharynx and nasopharynx clear.  NECK:  Supple, no jugular venous distention. No thyroid enlargement, no tenderness.  LUNGS: Normal breath sounds bilaterally, no wheezing, rales,rhonchi or crepitation. No use of accessory muscles of respiration.  CARDIOVASCULAR: S1, S2 normal. No murmurs, rubs, or gallops.  ABDOMEN: Soft, nontender, nondistended. Bowel sounds present. No organomegaly or mass.  EXTREMITIES: No pedal edema, cyanosis, or clubbing.  NEUROLOGIC: Cranial nerves II through XII are intact. Muscle strength 5/5 in all extremities. Sensation intact. Gait not checked.  PSYCHIATRIC: The patient is alert and oriented x 3.  SKIN: No obvious rash, lesion, or ulcer.    LABORATORY PANEL:   CBC  Recent Labs Lab 10/29/15 0648  WBC 9.1  HGB 12.5*  HCT 38.2*  PLT 106*   ------------------------------------------------------------------------------------------------------------------  Chemistries   Recent Labs Lab 10/28/15 1900 10/29/15 0648  NA 137 138  K 4.4 4.6  CL 106 107  CO2 19* 21*  GLUCOSE 115* 117*  BUN 32* 31*  CREATININE 1.56* 1.52*  CALCIUM 9.6 8.9  AST 24  --   ALT 23  --   ALKPHOS 57  --   BILITOT 1.8*  --    ------------------------------------------------------------------------------------------------------------------  Cardiac Enzymes  Recent Labs Lab 10/29/15 0648  TROPONINI 0.05*   ------------------------------------------------------------------------------------------------------------------  RADIOLOGY:  Ct Angio Chest Pe W/cm &/or Wo Cm  10/28/2015  CLINICAL DATA:   Intermittent chest pain the past couple months left-sided and and abdomen. Some shortness-of-breath. EXAM: CT ANGIOGRAPHY CHEST CT ABDOMEN AND PELVIS WITH CONTRAST TECHNIQUE: Multidetector CT imaging of the chest was performed using the standard protocol during bolus administration of intravenous contrast. Multiplanar CT image reconstructions and MIPs were obtained to evaluate the vascular anatomy. Multidetector CT imaging of the abdomen and pelvis was performed using the standard protocol during bolus administration of intravenous contrast. CONTRAST:  75 mL Isovue 370 IV COMPARISON:  None. CT chest, abdomen and pelvis 05/04/2015 and chest CT 12/21/2014. Abdominal/pelvic CT 08/01/2009 FINDINGS: CTA CHEST FINDINGS Lungs are adequately inflated demonstrate interval development of a moderate size right pleural effusion and small left pleural effusion. There is patchy hazy airspace opacification over the left lower lobe which is new. Again noted is a 2.9 cm cavitary lesion over the left apex as the wall is slightly thicker compared to the prior exams although size unchanged. No intracavitary solid component. Airways are within normal. Mild cardiomegaly. Evidence of previous median sternotomy. There is mild prominence of the pulmonary arteries which may reflect a degree of pulmonary arterial hypertension. No evidence of pulmonary emboli. There is mild worsening bilateral hilar and mediastinal adenopathy. The right hilar lymph node measures 1.6 cm, subcarinal lymph node 1.4 cm and prevascular lymph node 1.3 cm by short axis. Calcified plaque over the thoracic aorta. Remaining mediastinal structures are unremarkable. A few small right pericardial phrenic lymph nodes. CT ABDOMEN and PELVIS FINDINGS Abdominal images demonstrate mild gallbladder wall thickening with ill definition of the adjacent fat which could be seen in acute cholecystitis. Mild fluid adjacent the duodenal C-sweep. The liver, spleen, pancreas and adrenal  glands are within normal. Kidneys are normal in size with several bilateral stable cysts and mild bilateral nephrolithiasis. No evidence of hydronephrosis. Ureters are normal. There is calcified plaque over the abdominal aorta. Stomach is within normal. Small bowel is unremarkable. Appendix is not visualized. Surgical clips are present over the region of the cecum/ascending colon. Pelvic images demonstrate a small amount of free fluid over the midline. The bladder is decompressed. Prostate and rectum are normal. There are degenerative changes throughout the spine. Multilevel disc disease over the lumbar spine. Grade 1 anterolisthesis of L4 on L5. Minimal degenerate change of the hips. 2 cm oval lytic lesion over the left femoral neck unchanged from 2011. Review of the MIP images confirms the above findings. IMPRESSION: Interval development of moderate size right effusion and small left effusion with hazy airspace process over the left lower lobe/ lingular as findings may be due to infection. Worsening bilateral hilar and mediastinal adenopathy as described. Cavity lesion over the left apex unchanged in size although slight interval wall thickening without intracavitary solid component. Cannot exclude an infectious process such as chronic TB or fungal disease. Neoplasm would be less likely. No evidence of pulmonary embolism. Gallbladder wall thickening with suggestion of adjacent inflammatory change as findings may be due to acute cholecystitis. Minimal adjacent free fluid and minimal fluid over the upper pelvis. Recommend clinical correlation and right upper quadrant ultrasound if indicated. Bilateral renal cysts and nephrolithiasis unchanged. Mild cardiomegaly. Electronically Signed   By: Marin Olp M.D.   On: 10/28/2015 22:03   Ct Abdomen Pelvis W Contrast  10/28/2015  CLINICAL DATA:  Intermittent chest pain the past  couple months left-sided and and abdomen. Some shortness-of-breath. EXAM: CT ANGIOGRAPHY  CHEST CT ABDOMEN AND PELVIS WITH CONTRAST TECHNIQUE: Multidetector CT imaging of the chest was performed using the standard protocol during bolus administration of intravenous contrast. Multiplanar CT image reconstructions and MIPs were obtained to evaluate the vascular anatomy. Multidetector CT imaging of the abdomen and pelvis was performed using the standard protocol during bolus administration of intravenous contrast. CONTRAST:  75 mL Isovue 370 IV COMPARISON:  None. CT chest, abdomen and pelvis 05/04/2015 and chest CT 12/21/2014. Abdominal/pelvic CT 08/01/2009 FINDINGS: CTA CHEST FINDINGS Lungs are adequately inflated demonstrate interval development of a moderate size right pleural effusion and small left pleural effusion. There is patchy hazy airspace opacification over the left lower lobe which is new. Again noted is a 2.9 cm cavitary lesion over the left apex as the wall is slightly thicker compared to the prior exams although size unchanged. No intracavitary solid component. Airways are within normal. Mild cardiomegaly. Evidence of previous median sternotomy. There is mild prominence of the pulmonary arteries which may reflect a degree of pulmonary arterial hypertension. No evidence of pulmonary emboli. There is mild worsening bilateral hilar and mediastinal adenopathy. The right hilar lymph node measures 1.6 cm, subcarinal lymph node 1.4 cm and prevascular lymph node 1.3 cm by short axis. Calcified plaque over the thoracic aorta. Remaining mediastinal structures are unremarkable. A few small right pericardial phrenic lymph nodes. CT ABDOMEN and PELVIS FINDINGS Abdominal images demonstrate mild gallbladder wall thickening with ill definition of the adjacent fat which could be seen in acute cholecystitis. Mild fluid adjacent the duodenal C-sweep. The liver, spleen, pancreas and adrenal glands are within normal. Kidneys are normal in size with several bilateral stable cysts and mild bilateral  nephrolithiasis. No evidence of hydronephrosis. Ureters are normal. There is calcified plaque over the abdominal aorta. Stomach is within normal. Small bowel is unremarkable. Appendix is not visualized. Surgical clips are present over the region of the cecum/ascending colon. Pelvic images demonstrate a small amount of free fluid over the midline. The bladder is decompressed. Prostate and rectum are normal. There are degenerative changes throughout the spine. Multilevel disc disease over the lumbar spine. Grade 1 anterolisthesis of L4 on L5. Minimal degenerate change of the hips. 2 cm oval lytic lesion over the left femoral neck unchanged from 2011. Review of the MIP images confirms the above findings. IMPRESSION: Interval development of moderate size right effusion and small left effusion with hazy airspace process over the left lower lobe/ lingular as findings may be due to infection. Worsening bilateral hilar and mediastinal adenopathy as described. Cavity lesion over the left apex unchanged in size although slight interval wall thickening without intracavitary solid component. Cannot exclude an infectious process such as chronic TB or fungal disease. Neoplasm would be less likely. No evidence of pulmonary embolism. Gallbladder wall thickening with suggestion of adjacent inflammatory change as findings may be due to acute cholecystitis. Minimal adjacent free fluid and minimal fluid over the upper pelvis. Recommend clinical correlation and right upper quadrant ultrasound if indicated. Bilateral renal cysts and nephrolithiasis unchanged. Mild cardiomegaly. Electronically Signed   By: Marin Olp M.D.   On: 10/28/2015 22:03   Dg Chest Portable 1 View  10/28/2015  CLINICAL DATA:  Dyspnea. Intermittent chest pain for months. Left-sided chest and abdominal pain. Shortness of breath. EXAM: PORTABLE CHEST 1 VIEW COMPARISON:  Chest CT 05/04/2015. Radiographs and 07/12/2015 and 02/01/2015 reports, images not available  for review. FINDINGS: Patient is post median  sternotomy. The heart is enlarged. Small pleural effusions and bibasilar opacities, favor atelectasis. Mild vascular congestion without alveolar edema. No pneumothorax left apical bleb on prior CT not well seen radiographically. Remote left rib fracture. IMPRESSION: 1. Cardiomegaly. Small pleural effusions and probable bibasilar atelectasis. 2. Mild vascular congestion, no alveolar edema. 3. Overall findings may reflect fluid overload. Electronically Signed   By: Jeb Levering M.D.   On: 10/28/2015 19:38   US Abdomen Limited Ruq  10/29/2015  CLINICAL DATA:  Upper abdominal pain for years, shortness of breath. Abnormal gallbladder on CT. EXAM: US ABDOMEN LIMITED - RIGHT UPPER QUADRANT COMPARISON:  CT earlier this day. FINDINGS: Gallbladder: Physiologically distended. No gallstones. Diffuse gallbladder wall thickening measuring 5-6 mm. Trace pericholecystic fluid. No sonographic Murphy sign noted by sonographer. Common bile duct: Diameter: 2 mm. Liver: No focal lesion identified. Within normal limits in parenchymal echogenicity. Normal directional flow in the main portal vein. Incidental: Right pleural effusion.  Right renal cysts. IMPRESSION: Diffuse gallbladder wall thickening with trace pericholecystic fluid. No gallstones. Findings may reflect acalculus cholecystitis, acute or chronic versus sequela of liver disease. No biliary dilatation. Electronically Signed   By: Jeb Levering M.D.   On: 10/29/2015 00:10     ASSESSMENT AND PLAN:   Active Problems:   Acalculous cholecystitis   #1 acalculus cholecystitis: Abdominal pain still present so continue nothing by mouth, , IV antibiotics, surgery is following.  #2 mild congestive heart failure: Continue IV Lasix, continue oxygen, and O2 sats 97% on 2 L. #3 atrial fibrillation: Rate controlled continue heparin drip. Hold  Oral anticoagulations. For possible need for surgery. #3 history of CAD, triple  bypass long time ago: No chest pain. Patient is on now beta blockers, statins and now patient follows up with Dr. Ubaldo Glassing. Cardiology  consult is required  Due to his h/o CADm For cardiac  clearance in case if he needs surgery.follow echo Chronic kidney disease stage III;stable    All the records are reviewed and case discussed with Care Management/Social Workerr. Management plans discussed with the patient, family and they are in agreement.  CODE STATUS: full  TOTAL TIME TAKING CARE OF THIS PATIENT: 35minutes.   POSSIBLE D/C IN 2-3DAYS, DEPENDING ON CLINICAL CONDITION.   Epifanio Lesches M.D on 10/29/2015 at 10:53 AM  Between 7am to 6pm - Pager - [phone]  After 6pm go to [email or web address] - password [email or web address] [facility]  Office  [phone]  CC: Primary care physician; Rusty Aus, MD

## 2015-10-29 NOTE — Progress Notes (Addendum)
ANTICOAGULATION CONSULT NOTE - Initial Consult  Pharmacy Consult for heparin drip Indication: atrial fibrillation  Allergies  Allergen Reactions  . Diphenhydramine Hcl Other (See Comments)    Reaction:  Unknown   . Escitalopram Other (See Comments)    Reaction:  Makes him feel faint   . Maxidex [Dexamethasone] Other (See Comments)    Reaction:  Unknown   . Prednisone Other (See Comments)    Pt states that this medication made him feel crazy.    . Serotonin Other (See Comments)    Reaction:  Unknown   . Vytorin [Ezetimibe-Simvastatin] Other (See Comments)    Reaction:  Unknown   . Zocor [Simvastatin] Other (See Comments)    Reaction:  Unknown     Patient Measurements: Weight: 209 lb 12.8 oz (95.165 kg) Heparin Dosing Weight: 95.2kg  Vital Signs: Temp: 97.7 F (36.5 C) (06/16 1907) Temp Source: Oral (06/16 1907) BP: 111/78 mmHg (06/17 0245) Pulse Rate: 105 (06/17 0245)  Labs:  Recent Labs  10/28/15 1900 10/29/15 0204  HGB 12.4*  --   HCT 38.0*  --   PLT 106*  --   APTT  --  38*  LABPROT  --  19.2*  INR  --  1.61  HEPARINUNFRC  --  2.00*  CREATININE 1.56*  --   TROPONINI 0.05* 0.04*    Estimated Creatinine Clearance: 44.8 mL/min (by C-G formula based on Cr of 1.56).   Medical History: Past Medical History  Diagnosis Date  . Hypertension   . MI (myocardial infarction) ([place])     x 2  . Coronary artery disease   . Dysrhythmia   . Cardiomyopathy ([place])   . Lymphadenopathy, hilar   . Wheezing   . Cough   . Anemia   . Anxiety   . Chronic kidney disease     kidney stones    Medications:  On eliquis as outpatient  Assessment:  Goal of Therapy:  Heparin level 0.3-0.7 units/ml  aPTT goal  66-102. Monitor platelets by anticoagulation protocol: Yes   Plan:  Will follow and adjust by aPTT until correlation with heparin level. 5000 unit bolus and initial rate of 1250 units/hr. Bolusing at lower end of range d/t slightly elevated aPTT at baseline.  Heparin level and aPTT 6 hours after start of infusion.    Tejuan Gholson S 10/29/2015,3:22 AM

## 2015-10-30 LAB — ECHOCARDIOGRAM COMPLETE
AOASC: 35 cm
AVAREAVTI: 2.71 cm2
AVPG: 4 mmHg
AVPKVEL: 98.6 cm/s
Ao pk vel: 0.65 m/s
CHL CUP AV PEAK INDEX: 1.2
FS: 11 % — AB (ref 28–44)
HEIGHTINCHES: 76 in
IV/PV OW: 0.86
LA diam end sys: 57 mm
LA diam index: 2.52 cm/m2
LA vol A4C: 117 ml
LA vol index: 47.3 mL/m2
LA vol: 107 mL
LASIZE: 57 mm
LDCA: 4.15 cm2
LVOT diameter: 23 mm
LVOT peak vel: 64.5 cm/s
Mean grad: 225 mmHg
PW: 11.6 mm — AB (ref 0.6–1.1)
VTI: 139 cm
WEIGHTICAEL: 3356.8 [oz_av]

## 2015-10-30 LAB — BASIC METABOLIC PANEL
Anion gap: 11 (ref 5–15)
BUN: 36 mg/dL — AB (ref 6–20)
CO2: 22 mmol/L (ref 22–32)
CREATININE: 1.59 mg/dL — AB (ref 0.61–1.24)
Calcium: 8.4 mg/dL — ABNORMAL LOW (ref 8.9–10.3)
Chloride: 104 mmol/L (ref 101–111)
GFR, EST AFRICAN AMERICAN: 45 mL/min — AB (ref >60)
GFR, EST NON AFRICAN AMERICAN: 39 mL/min — AB (ref >60)
Glucose, Bld: 84 mg/dL (ref 65–99)
POTASSIUM: 3.7 mmol/L (ref 3.5–5.1)
SODIUM: 137 mmol/L (ref 135–145)

## 2015-10-30 LAB — CBC
HCT: 34.2 % — ABNORMAL LOW (ref 40.0–52.0)
Hemoglobin: 11.4 g/dL — ABNORMAL LOW (ref 13.0–18.0)
MCH: 31.8 pg (ref 26.0–34.0)
MCHC: 33.3 g/dL (ref 32.0–36.0)
MCV: 95.6 fL (ref 80.0–100.0)
PLATELETS: 89 10*3/uL — AB (ref 150–440)
RBC: 3.57 MIL/uL — AB (ref 4.40–5.90)
RDW: 18.3 % — AB (ref 11.5–14.5)
WBC: 5.8 10*3/uL (ref 3.8–10.6)

## 2015-10-30 LAB — APTT: APTT: 99 s — AB (ref 24–36)

## 2015-10-30 LAB — HEPARIN LEVEL (UNFRACTIONATED): Heparin Unfractionated: 1.83 IU/mL — ABNORMAL HIGH (ref 0.30–0.70)

## 2015-10-30 MED ORDER — APIXABAN 2.5 MG PO TABS
2.5000 mg | ORAL_TABLET | Freq: Two times a day (BID) | ORAL | Status: DC
  Administered 2015-10-30 – 2015-11-01 (×5): 2.5 mg via ORAL
  Filled 2015-10-30 (×5): qty 1

## 2015-10-30 MED ORDER — HEPARIN (PORCINE) IN NACL 100-0.45 UNIT/ML-% IJ SOLN: 700.0000 [IU]/h | INTRAMUSCULAR | Status: DC

## 2015-10-30 MED ORDER — FUROSEMIDE 10 MG/ML IJ SOLN
20.0000 mg | Freq: Two times a day (BID) | INTRAMUSCULAR | Status: DC
  Administered 2015-10-30 – 2015-10-31 (×2): 20 mg via INTRAVENOUS
  Filled 2015-10-30 (×2): qty 2

## 2015-10-30 NOTE — Progress Notes (Signed)
ANTICOAGULATION CONSULT NOTE - Initial Consult  Pharmacy Consult for heparin drip Indication: atrial fibrillation  Allergies  Allergen Reactions  . Diphenhydramine Hcl Other (See Comments)    Reaction:  Unknown   . Escitalopram Other (See Comments)    Reaction:  Makes him feel faint   . Maxidex [Dexamethasone] Other (See Comments)    Reaction:  Unknown   . Prednisone Other (See Comments)    Pt states that this medication made him feel crazy.    . Serotonin Other (See Comments)    Reaction:  Unknown   . Vytorin [Ezetimibe-Simvastatin] Other (See Comments)    Reaction:  Unknown   . Zocor [Simvastatin] Other (See Comments)    Reaction:  Unknown     Patient Measurements: Height: 6\' 4"  (193 cm) Weight: 209 lb 12.8 oz (95.165 kg) IBW/kg (Calculated) : 86.8 Heparin Dosing Weight: 95.2kg  Vital Signs: Temp: 97.6 F (36.4 C) (06/17 1923) Temp Source: Oral (06/17 1923) BP: 122/79 mmHg (06/17 1923) Pulse Rate: 115 (06/17 1923)  Labs:  Recent Labs  10/28/15 1900  10/29/15 0204 10/29/15 0648 10/29/15 1025 10/29/15 1251 10/29/15 1919 10/30/15 0256  HGB 12.4*  --   --  12.5*  --   --   --  11.4*  HCT 38.0*  --   --  38.2*  --   --   --  34.2*  PLT 106*  --   --  106*  --   --   --  89*  APTT  --   < > 38*  --  >160*  --  >160* >160*  LABPROT  --   --  19.2*  --   --   --   --   --   INR  --   --  1.61  --   --   --   --   --   HEPARINUNFRC  --   --  2.00*  --  2.00*  --   --  1.83*  CREATININE 1.56*  --   --  1.52*  --   --   --  1.59*  TROPONINI 0.05*  --  0.04* 0.05*  --  0.03  --   --   < > = values in this interval not displayed.  Estimated Creatinine Clearance: 44 mL/min (by C-G formula based on Cr of 1.59).   Medications:  On eliquis as outpatient, last dose 6/16 at 0800  Assessment:  Goal of Therapy:  Heparin level 0.3-0.7 units/ml  aPTT goal  66-102. Monitor platelets by anticoagulation protocol: Yes   Plan:  Will follow and adjust by aPTT until  correlation with heparin level. Current orders for heparin 1100 units/hr  APTT > 160. Spoke with RN, will stop heparin infusion x 1 hour. Resume at 900 units/hr and recheck 6 hours after resumption  CBC and HL with AM labs  6/18 03:00 aPTT remains >160 and heparin level 1.83. Hold drip x 1 hour and restart at 700 units/hr. Next aPTT and heparin level 6 hours after restart.    Ramond Dial, Pharm.D Clinical Pharmacist   10/30/2015,4:43 AM

## 2015-10-30 NOTE — Progress Notes (Signed)
CC: Abd pain Subjective: No complaints this am  Objective: Vital signs in last 24 hours: Temp:  [97.4 F (36.3 C)-97.9 F (36.6 C)] 97.4 F (36.3 C) (06/18 0800) Pulse Rate:  [65-115] 75 (06/18 0800) Resp:  [18-21] 18 (06/18 0800) BP: (84-122)/(40-79) 92/62 mmHg (06/18 0800) SpO2:  [95 %-99 %] 98 % (06/18 0800) Weight:  [91.491 kg (201 lb 11.2 oz)] 91.491 kg (201 lb 11.2 oz) (06/18 0617) Last BM Date: 10/28/15  Intake/Output from previous day: 06/17 0701 - 06/18 0700 In: 650.7 [P.O.:240; I.V.:260.7; IV Piggyback:150] Out: 3400 [Urine:3400] Intake/Output this shift: Total I/O In: -  Out: 200 [Urine:200]  Physical exam: Elderly male in no acute distress  Abd: soft, NT, no peritontis Ext: well perfused, warm   Lab Results: CBC   Recent Labs  10/29/15 0648 10/30/15 0256  WBC 9.1 5.8  HGB 12.5* 11.4*  HCT 38.2* 34.2*  PLT 106* 89*   BMET  Recent Labs  10/29/15 0648 10/30/15 0256  NA 138 137  K 4.6 3.7  CL 107 104  CO2 21* 22  GLUCOSE 117* 84  BUN 31* 36*  CREATININE 1.52* 1.59*  CALCIUM 8.9 8.4*   PT/INR  Recent Labs  10/29/15 0204  LABPROT 19.2*  INR 1.61   ABG No results for input(s): PHART, HCO3 in the last 72 hours.  Invalid input(s): PCO2, PO2  Studies/Results: Ct Angio Chest Pe W/cm &/or Wo Cm  10/28/2015  CLINICAL DATA:  Intermittent chest pain the past couple months left-sided and and abdomen. Some shortness-of-breath. EXAM: CT ANGIOGRAPHY CHEST CT ABDOMEN AND PELVIS WITH CONTRAST TECHNIQUE: Multidetector CT imaging of the chest was performed using the standard protocol during bolus administration of intravenous contrast. Multiplanar CT image reconstructions and MIPs were obtained to evaluate the vascular anatomy. Multidetector CT imaging of the abdomen and pelvis was performed using the standard protocol during bolus administration of intravenous contrast. CONTRAST:  75 mL Isovue 370 IV COMPARISON:  None. CT chest, abdomen and pelvis  05/04/2015 and chest CT 12/21/2014. Abdominal/pelvic CT 08/01/2009 FINDINGS: CTA CHEST FINDINGS Lungs are adequately inflated demonstrate interval development of a moderate size right pleural effusion and small left pleural effusion. There is patchy hazy airspace opacification over the left lower lobe which is new. Again noted is a 2.9 cm cavitary lesion over the left apex as the wall is slightly thicker compared to the prior exams although size unchanged. No intracavitary solid component. Airways are within normal. Mild cardiomegaly. Evidence of previous median sternotomy. There is mild prominence of the pulmonary arteries which may reflect a degree of pulmonary arterial hypertension. No evidence of pulmonary emboli. There is mild worsening bilateral hilar and mediastinal adenopathy. The right hilar lymph node measures 1.6 cm, subcarinal lymph node 1.4 cm and prevascular lymph node 1.3 cm by short axis. Calcified plaque over the thoracic aorta. Remaining mediastinal structures are unremarkable. A few small right pericardial phrenic lymph nodes. CT ABDOMEN and PELVIS FINDINGS Abdominal images demonstrate mild gallbladder wall thickening with ill definition of the adjacent fat which could be seen in acute cholecystitis. Mild fluid adjacent the duodenal C-sweep. The liver, spleen, pancreas and adrenal glands are within normal. Kidneys are normal in size with several bilateral stable cysts and mild bilateral nephrolithiasis. No evidence of hydronephrosis. Ureters are normal. There is calcified plaque over the abdominal aorta. Stomach is within normal. Small bowel is unremarkable. Appendix is not visualized. Surgical clips are present over the region of the cecum/ascending colon. Pelvic images demonstrate a small amount  of free fluid over the midline. The bladder is decompressed. Prostate and rectum are normal. There are degenerative changes throughout the spine. Multilevel disc disease over the lumbar spine. Grade 1  anterolisthesis of L4 on L5. Minimal degenerate change of the hips. 2 cm oval lytic lesion over the left femoral neck unchanged from 2011. Review of the MIP images confirms the above findings. IMPRESSION: Interval development of moderate size right effusion and small left effusion with hazy airspace process over the left lower lobe/ lingular as findings may be due to infection. Worsening bilateral hilar and mediastinal adenopathy as described. Cavity lesion over the left apex unchanged in size although slight interval wall thickening without intracavitary solid component. Cannot exclude an infectious process such as chronic TB or fungal disease. Neoplasm would be less likely. No evidence of pulmonary embolism. Gallbladder wall thickening with suggestion of adjacent inflammatory change as findings may be due to acute cholecystitis. Minimal adjacent free fluid and minimal fluid over the upper pelvis. Recommend clinical correlation and right upper quadrant ultrasound if indicated. Bilateral renal cysts and nephrolithiasis unchanged. Mild cardiomegaly. Electronically Signed   By: Marin Olp M.D.   On: 10/28/2015 22:03   Ct Abdomen Pelvis W Contrast  10/28/2015  CLINICAL DATA:  Intermittent chest pain the past couple months left-sided and and abdomen. Some shortness-of-breath. EXAM: CT ANGIOGRAPHY CHEST CT ABDOMEN AND PELVIS WITH CONTRAST TECHNIQUE: Multidetector CT imaging of the chest was performed using the standard protocol during bolus administration of intravenous contrast. Multiplanar CT image reconstructions and MIPs were obtained to evaluate the vascular anatomy. Multidetector CT imaging of the abdomen and pelvis was performed using the standard protocol during bolus administration of intravenous contrast. CONTRAST:  75 mL Isovue 370 IV COMPARISON:  None. CT chest, abdomen and pelvis 05/04/2015 and chest CT 12/21/2014. Abdominal/pelvic CT 08/01/2009 FINDINGS: CTA CHEST FINDINGS Lungs are adequately  inflated demonstrate interval development of a moderate size right pleural effusion and small left pleural effusion. There is patchy hazy airspace opacification over the left lower lobe which is new. Again noted is a 2.9 cm cavitary lesion over the left apex as the wall is slightly thicker compared to the prior exams although size unchanged. No intracavitary solid component. Airways are within normal. Mild cardiomegaly. Evidence of previous median sternotomy. There is mild prominence of the pulmonary arteries which may reflect a degree of pulmonary arterial hypertension. No evidence of pulmonary emboli. There is mild worsening bilateral hilar and mediastinal adenopathy. The right hilar lymph node measures 1.6 cm, subcarinal lymph node 1.4 cm and prevascular lymph node 1.3 cm by short axis. Calcified plaque over the thoracic aorta. Remaining mediastinal structures are unremarkable. A few small right pericardial phrenic lymph nodes. CT ABDOMEN and PELVIS FINDINGS Abdominal images demonstrate mild gallbladder wall thickening with ill definition of the adjacent fat which could be seen in acute cholecystitis. Mild fluid adjacent the duodenal C-sweep. The liver, spleen, pancreas and adrenal glands are within normal. Kidneys are normal in size with several bilateral stable cysts and mild bilateral nephrolithiasis. No evidence of hydronephrosis. Ureters are normal. There is calcified plaque over the abdominal aorta. Stomach is within normal. Small bowel is unremarkable. Appendix is not visualized. Surgical clips are present over the region of the cecum/ascending colon. Pelvic images demonstrate a small amount of free fluid over the midline. The bladder is decompressed. Prostate and rectum are normal. There are degenerative changes throughout the spine. Multilevel disc disease over the lumbar spine. Grade 1 anterolisthesis of L4 on L5.  Minimal degenerate change of the hips. 2 cm oval lytic lesion over the left femoral neck  unchanged from 2011. Review of the MIP images confirms the above findings. IMPRESSION: Interval development of moderate size right effusion and small left effusion with hazy airspace process over the left lower lobe/ lingular as findings may be due to infection. Worsening bilateral hilar and mediastinal adenopathy as described. Cavity lesion over the left apex unchanged in size although slight interval wall thickening without intracavitary solid component. Cannot exclude an infectious process such as chronic TB or fungal disease. Neoplasm would be less likely. No evidence of pulmonary embolism. Gallbladder wall thickening with suggestion of adjacent inflammatory change as findings may be due to acute cholecystitis. Minimal adjacent free fluid and minimal fluid over the upper pelvis. Recommend clinical correlation and right upper quadrant ultrasound if indicated. Bilateral renal cysts and nephrolithiasis unchanged. Mild cardiomegaly. Electronically Signed   By: Marin Olp M.D.   On: 10/28/2015 22:03   Dg Chest Portable 1 View  10/28/2015  CLINICAL DATA:  Dyspnea. Intermittent chest pain for months. Left-sided chest and abdominal pain. Shortness of breath. EXAM: PORTABLE CHEST 1 VIEW COMPARISON:  Chest CT 05/04/2015. Radiographs and 07/12/2015 and 02/01/2015 reports, images not available for review. FINDINGS: Patient is post median sternotomy. The heart is enlarged. Small pleural effusions and bibasilar opacities, favor atelectasis. Mild vascular congestion without alveolar edema. No pneumothorax left apical bleb on prior CT not well seen radiographically. Remote left rib fracture. IMPRESSION: 1. Cardiomegaly. Small pleural effusions and probable bibasilar atelectasis. 2. Mild vascular congestion, no alveolar edema. 3. Overall findings may reflect fluid overload. Electronically Signed   By: Jeb Levering M.D.   On: 10/28/2015 19:38   US Abdomen Limited Ruq  10/29/2015  CLINICAL DATA:  Upper abdominal pain  for years, shortness of breath. Abnormal gallbladder on CT. EXAM: US ABDOMEN LIMITED - RIGHT UPPER QUADRANT COMPARISON:  CT earlier this day. FINDINGS: Gallbladder: Physiologically distended. No gallstones. Diffuse gallbladder wall thickening measuring 5-6 mm. Trace pericholecystic fluid. No sonographic Murphy sign noted by sonographer. Common bile duct: Diameter: 2 mm. Liver: No focal lesion identified. Within normal limits in parenchymal echogenicity. Normal directional flow in the main portal vein. Incidental: Right pleural effusion.  Right renal cysts. IMPRESSION: Diffuse gallbladder wall thickening with trace pericholecystic fluid. No gallstones. Findings may reflect acalculus cholecystitis, acute or chronic versus sequela of liver disease. No biliary dilatation. Electronically Signed   By: Jeb Levering M.D.   On: 10/29/2015 00:10    Anti-infectives: Anti-infectives    Start     Dose/Rate Route Frequency Ordered Stop   10/29/15 0130  piperacillin-tazobactam (ZOSYN) IVPB 3.375 g     3.375 g 12.5 mL/hr over 240 Minutes Intravenous Every 8 hours 10/29/15 0115     10/28/15 2230  piperacillin-tazobactam (ZOSYN) IVPB 3.375 g     3.375 g 100 mL/hr over 30 Minutes Intravenous  Once 10/28/15 2217 10/28/15 2256      Assessment/Plan: Abdominal pain,  Not convinced is acalculus cholecystitis. Given the clinical setting I think might be related to the heart failure exacerbation pEnding echo report and pending cardiology Eval HIDA scan in the morning to definitively determine whether or not he has acute cholecystitis No immediate surgical indication at this time Continue to follow Caroleen Hamman, MD, [facility]  10/30/2015

## 2015-10-30 NOTE — Consult Note (Signed)
ANTICOAGULATION CONSULT NOTE - Initial Consult  Pharmacy Consult for apixaban Indication: atrial fibrillation  Allergies  Allergen Reactions  . Diphenhydramine Hcl Other (See Comments)    Reaction:  Unknown   . Escitalopram Other (See Comments)    Reaction:  Makes him feel faint   . Maxidex [Dexamethasone] Other (See Comments)    Reaction:  Unknown   . Prednisone Other (See Comments)    Pt states that this medication made him feel crazy.    . Serotonin Other (See Comments)    Reaction:  Unknown   . Vytorin [Ezetimibe-Simvastatin] Other (See Comments)    Reaction:  Unknown   . Zocor [Simvastatin] Other (See Comments)    Reaction:  Unknown     Patient Measurements: Height: 6\' 4"  (193 cm) Weight: 201 lb 11.2 oz (91.491 kg) IBW/kg (Calculated) : 86.8 Heparin Dosing Weight:   Vital Signs: Temp: 97.4 F (36.3 C) (06/18 0800) Temp Source: Oral (06/18 0800) BP: 92/62 mmHg (06/18 0800) Pulse Rate: 75 (06/18 0800)  Labs:  Recent Labs  10/28/15 1900  10/29/15 0204 10/29/15 0648 10/29/15 1025 10/29/15 1251 10/29/15 1919 10/30/15 0256 10/30/15 0732  HGB 12.4*  --   --  12.5*  --   --   --  11.4*  --   HCT 38.0*  --   --  38.2*  --   --   --  34.2*  --   PLT 106*  --   --  106*  --   --   --  89*  --   APTT  --   < > 38*  --  >160*  --  >160* >160* 99*  LABPROT  --   --  19.2*  --   --   --   --   --   --   INR  --   --  1.61  --   --   --   --   --   --   HEPARINUNFRC  --   --  2.00*  --  2.00*  --   --  1.83*  --   CREATININE 1.56*  --   --  1.52*  --   --   --  1.59*  --   TROPONINI 0.05*  --  0.04* 0.05*  --  0.03  --   --   --   < > = values in this interval not displayed.  Estimated Creatinine Clearance: 44 mL/min (by C-G formula based on Cr of 1.59).   Medical History: Past Medical History  Diagnosis Date  . Hypertension   . MI (myocardial infarction) ([place])     x 2  . Coronary artery disease   . Dysrhythmia   . Cardiomyopathy ([place])   . Lymphadenopathy,  hilar   . Wheezing   . Cough   . Anemia   . Anxiety   . Chronic kidney disease     kidney stones    Medications:  Scheduled:  . apixaban  2.5 mg Oral BID  . atorvastatin  20 mg Oral QHS  . ferrous sulfate  325 mg Oral Daily  . furosemide  20 mg Intravenous BID  . metoprolol tartrate  25 mg Oral BID  . pantoprazole  40 mg Oral BID AC  . piperacillin-tazobactam  3.375 g Intravenous Q8H  . potassium chloride  10 mEq Oral BID  . sodium chloride flush  3 mL Intravenous Q12H  . sucralfate  1 g Oral TID WC & HS  .  tamsulosin  0.4 mg Oral QPC supper  . vitamin C  250 mg Oral Daily    Assessment: Pt is a 82 year old male with nonvalvular afib. Pt on a heparin drip. Pharmacy consulted to convert patient to apixaban.   Goal of Therapy:   Monitor platelets by anticoagulation protocol: Yes   Plan:  Will start apixaban 2.5mg  BID since pt is >80 and Scr is > 1.5. Spoke with [name] RN, instructed to turn heparin drip off when she gives the first dose of apixaban. Pharmacy to continue to monitor CBC and scr.  Ramond Dial, Pharm.D Clinical Pharmacist   10/30/2015,11:06 AM

## 2015-10-30 NOTE — Consult Note (Signed)
Reason for Consult: left-sided pain coronary disease Congestive Heart Failure Referring Physician:   Dr. Emily Filbert primary  Dr. Conley Canal hospitalist  Andres Gutierrez is an 82 y.o. male.  HPI:  Patient presents with known history of coronary disease coronary bypass surgery in the distant past PCI and stent 97 Congestive Heart Failure recent atrial fibrillation myocardial infarction hypertension hyperlipidemia states she was doing reasonably well but complains of abdominal discomfort as well as left sided pain in arms and legs. Patient denies any black or spells or syncope no no discrete chest pain no angina. Patient gives a history of PND and orthopnea have any get up in the middle night because of congestion. He denies any significant sputum production. There has been compliant with his medication  Past Medical History  Diagnosis Date  . Hypertension   . MI (myocardial infarction) ([place])     x 2  . Coronary artery disease   . Dysrhythmia   . Cardiomyopathy ([place])   . Lymphadenopathy, hilar   . Wheezing   . Cough   . Anemia   . Anxiety   . Chronic kidney disease     kidney stones    Past Surgical History  Procedure Laterality Date  . Coronary artery bypass graft    . Coronary angioplasty with stent placement    . Endobronchial ultrasound N/A 12/31/2014    Procedure: ENDOBRONCHIAL ULTRASOUND;  Surgeon: Flora Lipps, MD;  Location: [facility] ORS;  Service: Cardiopulmonary;  Laterality: N/A;  . Bronchial needle aspiration biopsy N/A 12/31/2014    Procedure: BRONCHIAL NEEDLE ASPIRATION BIOPSIES from carina;  Surgeon: Flora Lipps, MD;  Location: [facility] ORS;  Service: Cardiopulmonary;  Laterality: N/A;  . Esophagogastroduodenoscopy (egd) with propofol N/A 06/20/2015    Procedure: ESOPHAGOGASTRODUODENOSCOPY (EGD) WITH PROPOFOL;  Surgeon: Lollie Sails, MD;  Location: [facility] ENDOSCOPY;  Service: Endoscopy;  Laterality: N/A;    Family History  Problem Relation Age of Onset  . CAD Mother   . Colon  cancer Father     Social History:  reports that he quit smoking about 38 years ago. His smokeless tobacco use includes Chew. He reports that he drinks alcohol. He reports that he does not use illicit drugs.  Allergies:  Allergies  Allergen Reactions  . Diphenhydramine Hcl Other (See Comments)    Reaction:  Unknown   . Escitalopram Other (See Comments)    Reaction:  Makes him feel faint   . Maxidex [Dexamethasone] Other (See Comments)    Reaction:  Unknown   . Prednisone Other (See Comments)    Pt states that this medication made him feel crazy.    . Serotonin Other (See Comments)    Reaction:  Unknown   . Vytorin [Ezetimibe-Simvastatin] Other (See Comments)    Reaction:  Unknown   . Zocor [Simvastatin] Other (See Comments)    Reaction:  Unknown     Medications: I have reviewed the patient's current medications.  Results for orders placed or performed during the hospital encounter of 10/28/15 (from the past 48 hour(s))  Comprehensive metabolic panel     Status: Abnormal   Collection Time: 10/28/15  7:00 PM  Result Value Ref Range   Sodium 137 135 - 145 mmol/L   Potassium 4.4 3.5 - 5.1 mmol/L   Chloride 106 101 - 111 mmol/L   CO2 19 (L) 22 - 32 mmol/L   Glucose, Bld 115 (H) 65 - 99 mg/dL   BUN 32 (H) 6 - 20 mg/dL   Creatinine, Ser 1.56 (  H) 0.61 - 1.24 mg/dL   Calcium 9.6 8.9 - 10.3 mg/dL   Total Protein 7.7 6.5 - 8.1 g/dL   Albumin 4.9 3.5 - 5.0 g/dL   AST 24 15 - 41 U/L   ALT 23 17 - 63 U/L   Alkaline Phosphatase 57 38 - 126 U/L   Total Bilirubin 1.8 (H) 0.3 - 1.2 mg/dL   GFR calc non Af Amer 40 (L) >60 mL/min   GFR calc Af Amer 46 (L) >60 mL/min    Comment: (NOTE) The eGFR has been calculated using the CKD EPI equation. This calculation has not been validated in all clinical situations. eGFR's persistently <60 mL/min signify possible Chronic Kidney Disease.    Anion gap 12 5 - 15  Lipase, blood     Status: Abnormal   Collection Time: 10/28/15  7:00 PM  Result  Value Ref Range   Lipase 52 (H) 11 - 51 U/L  Troponin I     Status: Abnormal   Collection Time: 10/28/15  7:00 PM  Result Value Ref Range   Troponin I 0.05 (H) <0.031 ng/mL    Comment: READ BACK AND VERIFIED WITH LINDLEY MONAR AT 1950 10/28/15.PMH        PERSISTENTLY INCREASED TROPONIN VALUES IN THE RANGE OF 0.04-0.49 ng/mL CAN BE SEEN IN:       -UNSTABLE ANGINA       -CONGESTIVE HEART FAILURE       -MYOCARDITIS       -CHEST TRAUMA       -ARRYHTHMIAS       -LATE PRESENTING MYOCARDIAL INFARCTION       -COPD   CLINICAL FOLLOW-UP RECOMMENDED.   CBC with Differential     Status: Abnormal   Collection Time: 10/28/15  7:00 PM  Result Value Ref Range   WBC 7.1 3.8 - 10.6 K/uL   RBC 4.00 (L) 4.40 - 5.90 MIL/uL   Hemoglobin 12.4 (L) 13.0 - 18.0 g/dL   HCT 38.0 (L) 40.0 - 52.0 %   MCV 95.0 80.0 - 100.0 fL   MCH 31.0 26.0 - 34.0 pg   MCHC 32.7 32.0 - 36.0 g/dL   RDW 17.7 (H) 11.5 - 14.5 %   Platelets 106 (L) 150 - 440 K/uL    Comment: COUNT MAY BE INACCURATE DUE TO FIBRIN CLUMPS.   Neutrophils Relative % 60% %   Neutro Abs 4.3 1.4 - 6.5 K/uL   Lymphocytes Relative 17% %   Lymphs Abs 1.2 1.0 - 3.6 K/uL   Monocytes Relative 22% %   Monocytes Absolute 1.6 (H) 0.2 - 1.0 K/uL   Eosinophils Relative 0% %   Eosinophils Absolute 0.0 0 - 0.7 K/uL   Basophils Relative 1% %   Basophils Absolute 0.0 0 - 0.1 K/uL  Urinalysis complete, with microscopic     Status: Abnormal   Collection Time: 10/28/15  9:09 PM  Result Value Ref Range   Color, Urine YELLOW (A) YELLOW   APPearance CLEAR (A) CLEAR   Glucose, UA NEGATIVE NEGATIVE mg/dL   Bilirubin Urine NEGATIVE NEGATIVE   Ketones, ur NEGATIVE NEGATIVE mg/dL   Specific Gravity, Urine 1.018 1.005 - 1.030   Hgb urine dipstick NEGATIVE NEGATIVE   pH 5.0 5.0 - 8.0   Protein, ur 30 (A) NEGATIVE mg/dL   Nitrite NEGATIVE NEGATIVE   Leukocytes, UA NEGATIVE NEGATIVE   RBC / HPF 0-5 0 - 5 RBC/hpf   WBC, UA 0-5 0 - 5 WBC/hpf   Bacteria, UA NONE  SEEN  NONE SEEN   Squamous Epithelial / LPF 0-5 (A) NONE SEEN   Mucous PRESENT    Ca Oxalate Crys, UA PRESENT   Troponin I     Status: Abnormal   Collection Time: 10/29/15  2:04 AM  Result Value Ref Range   Troponin I 0.04 (H) <0.031 ng/mL    Comment: PREVIOUS RESULT CALLED AT 1950 10/28/15.PMH        PERSISTENTLY INCREASED TROPONIN VALUES IN THE RANGE OF 0.04-0.49 ng/mL CAN BE SEEN IN:       -UNSTABLE ANGINA       -CONGESTIVE HEART FAILURE       -MYOCARDITIS       -CHEST TRAUMA       -ARRYHTHMIAS       -LATE PRESENTING MYOCARDIAL INFARCTION       -COPD   CLINICAL FOLLOW-UP RECOMMENDED.   TSH     Status: None   Collection Time: 10/29/15  2:04 AM  Result Value Ref Range   TSH 2.785 0.350 - 4.500 uIU/mL  Heparin level (unfractionated)     Status: Abnormal   Collection Time: 10/29/15  2:04 AM  Result Value Ref Range   Heparin Unfractionated 2.00 (H) 0.30 - 0.70 IU/mL    Comment:        IF HEPARIN RESULTS ARE BELOW EXPECTED VALUES, AND PATIENT DOSAGE HAS BEEN CONFIRMED, SUGGEST FOLLOW UP TESTING OF ANTITHROMBIN III LEVELS. RESULT REPEATED AND VERIFIED   APTT     Status: Abnormal   Collection Time: 10/29/15  2:04 AM  Result Value Ref Range   aPTT 38 (H) 24 - 36 seconds    Comment:        IF BASELINE aPTT IS ELEVATED, SUGGEST PATIENT RISK ASSESSMENT BE USED TO DETERMINE APPROPRIATE ANTICOAGULANT THERAPY.   Protime-INR     Status: Abnormal   Collection Time: 10/29/15  2:04 AM  Result Value Ref Range   Prothrombin Time 19.2 (H) 11.4 - 15.0 seconds   INR 1.61   Troponin I     Status: Abnormal   Collection Time: 10/29/15  6:48 AM  Result Value Ref Range   Troponin I 0.05 (H) <0.031 ng/mL    Comment: PREVIOUS RESULT CALLED @1950  ON 10/28/15 BY PMH...HKP        PERSISTENTLY INCREASED TROPONIN VALUES IN THE RANGE OF 0.04-0.49 ng/mL CAN BE SEEN IN:       -UNSTABLE ANGINA       -CONGESTIVE HEART FAILURE       -MYOCARDITIS       -CHEST TRAUMA       -ARRYHTHMIAS       -LATE  PRESENTING MYOCARDIAL INFARCTION       -COPD   CLINICAL FOLLOW-UP RECOMMENDED.   Basic metabolic panel     Status: Abnormal   Collection Time: 10/29/15  6:48 AM  Result Value Ref Range   Sodium 138 135 - 145 mmol/L   Potassium 4.6 3.5 - 5.1 mmol/L   Chloride 107 101 - 111 mmol/L   CO2 21 (L) 22 - 32 mmol/L   Glucose, Bld 117 (H) 65 - 99 mg/dL   BUN 31 (H) 6 - 20 mg/dL   Creatinine, Ser 1.52 (H) 0.61 - 1.24 mg/dL   Calcium 8.9 8.9 - 10.3 mg/dL   GFR calc non Af Amer 41 (L) >60 mL/min   GFR calc Af Amer 47 (L) >60 mL/min    Comment: (NOTE) The eGFR has been calculated using the CKD EPI equation. This calculation  has not been validated in all clinical situations. eGFR's persistently <60 mL/min signify possible Chronic Kidney Disease.    Anion gap 10 5 - 15  CBC     Status: Abnormal   Collection Time: 10/29/15  6:48 AM  Result Value Ref Range   WBC 9.1 3.8 - 10.6 K/uL   RBC 3.92 (L) 4.40 - 5.90 MIL/uL   Hemoglobin 12.5 (L) 13.0 - 18.0 g/dL   HCT 38.2 (L) 40.0 - 52.0 %   MCV 97.3 80.0 - 100.0 fL   MCH 31.9 26.0 - 34.0 pg   MCHC 32.8 32.0 - 36.0 g/dL   RDW 18.6 (H) 11.5 - 14.5 %   Platelets 106 (L) 150 - 440 K/uL    Comment: PLATELET COUNT CONFIRMED BY SMEAR  Heparin level (unfractionated)     Status: Abnormal   Collection Time: 10/29/15 10:25 AM  Result Value Ref Range   Heparin Unfractionated 2.00 (H) 0.30 - 0.70 IU/mL    Comment:        IF HEPARIN RESULTS ARE BELOW EXPECTED VALUES, AND PATIENT DOSAGE HAS BEEN CONFIRMED, SUGGEST FOLLOW UP TESTING OF ANTITHROMBIN III LEVELS.   APTT     Status: Abnormal   Collection Time: 10/29/15 10:25 AM  Result Value Ref Range   aPTT >160 (HH) 24 - 36 seconds    Comment: CRITICAL RESULT CALLED TO, READ BACK BY AND VERIFIED WITH: SUSAN PRESTO @ 1215 10/29/15 BY [facility]   Troponin I     Status: None   Collection Time: 10/29/15 12:51 PM  Result Value Ref Range   Troponin I 0.03 <0.031 ng/mL    Comment:        NO INDICATION  OF MYOCARDIAL INJURY.   APTT     Status: Abnormal   Collection Time: 10/29/15  7:19 PM  Result Value Ref Range   aPTT >160 (HH) 24 - 36 seconds    Comment:        IF BASELINE aPTT IS ELEVATED, SUGGEST PATIENT RISK ASSESSMENT BE USED TO DETERMINE APPROPRIATE ANTICOAGULANT THERAPY. RESULT REPEATED AND VERIFIED CRITICAL RESULT CALLED TO, READ BACK BY AND VERIFIED WITH: ALISA SCOTT AT 2010 10/29/15.PMH   Basic metabolic panel     Status: Abnormal   Collection Time: 10/30/15  2:56 AM  Result Value Ref Range   Sodium 137 135 - 145 mmol/L   Potassium 3.7 3.5 - 5.1 mmol/L   Chloride 104 101 - 111 mmol/L   CO2 22 22 - 32 mmol/L   Glucose, Bld 84 65 - 99 mg/dL   BUN 36 (H) 6 - 20 mg/dL   Creatinine, Ser 1.59 (H) 0.61 - 1.24 mg/dL   Calcium 8.4 (L) 8.9 - 10.3 mg/dL   GFR calc non Af Amer 39 (L) >60 mL/min   GFR calc Af Amer 45 (L) >60 mL/min    Comment: (NOTE) The eGFR has been calculated using the CKD EPI equation. This calculation has not been validated in all clinical situations. eGFR's persistently <60 mL/min signify possible Chronic Kidney Disease.    Anion gap 11 5 - 15  CBC     Status: Abnormal   Collection Time: 10/30/15  2:56 AM  Result Value Ref Range   WBC 5.8 3.8 - 10.6 K/uL   RBC 3.57 (L) 4.40 - 5.90 MIL/uL   Hemoglobin 11.4 (L) 13.0 - 18.0 g/dL   HCT 34.2 (L) 40.0 - 52.0 %   MCV 95.6 80.0 - 100.0 fL   MCH 31.8 26.0 - 34.0 pg  MCHC 33.3 32.0 - 36.0 g/dL   RDW 18.3 (H) 11.5 - 14.5 %   Platelets 89 (L) 150 - 440 K/uL    Comment: COUNT MAY BE INACCURATE DUE TO FIBRIN CLUMPS.  Heparin level (unfractionated)     Status: Abnormal   Collection Time: 10/30/15  2:56 AM  Result Value Ref Range   Heparin Unfractionated 1.83 (H) 0.30 - 0.70 IU/mL    Comment:        IF HEPARIN RESULTS ARE BELOW EXPECTED VALUES, AND PATIENT DOSAGE HAS BEEN CONFIRMED, SUGGEST FOLLOW UP TESTING OF ANTITHROMBIN III LEVELS.   APTT     Status: Abnormal   Collection Time: 10/30/15  2:56 AM   Result Value Ref Range   aPTT >160 (HH) 24 - 36 seconds    Comment:        IF BASELINE aPTT IS ELEVATED, SUGGEST PATIENT RISK ASSESSMENT BE USED TO DETERMINE APPROPRIATE ANTICOAGULANT THERAPY. CRITICAL RESULT CALLED TO, READ BACK BY AND VERIFIED WITH: ALISA SCOTT AT 0356 ON 10/30/15 RWW   APTT     Status: Abnormal   Collection Time: 10/30/15  7:32 AM  Result Value Ref Range   aPTT 99 (H) 24 - 36 seconds    Comment:        IF BASELINE aPTT IS ELEVATED, SUGGEST PATIENT RISK ASSESSMENT BE USED TO DETERMINE APPROPRIATE ANTICOAGULANT THERAPY.     Ct Angio Chest Pe W/cm &/or Wo Cm  10/28/2015  CLINICAL DATA:  Intermittent chest pain the past couple months left-sided and and abdomen. Some shortness-of-breath. EXAM: CT ANGIOGRAPHY CHEST CT ABDOMEN AND PELVIS WITH CONTRAST TECHNIQUE: Multidetector CT imaging of the chest was performed using the standard protocol during bolus administration of intravenous contrast. Multiplanar CT image reconstructions and MIPs were obtained to evaluate the vascular anatomy. Multidetector CT imaging of the abdomen and pelvis was performed using the standard protocol during bolus administration of intravenous contrast. CONTRAST:  75 mL Isovue 370 IV COMPARISON:  None. CT chest, abdomen and pelvis 05/04/2015 and chest CT 12/21/2014. Abdominal/pelvic CT 08/01/2009 FINDINGS: CTA CHEST FINDINGS Lungs are adequately inflated demonstrate interval development of a moderate size right pleural effusion and small left pleural effusion. There is patchy hazy airspace opacification over the left lower lobe which is new. Again noted is a 2.9 cm cavitary lesion over the left apex as the wall is slightly thicker compared to the prior exams although size unchanged. No intracavitary solid component. Airways are within normal. Mild cardiomegaly. Evidence of previous median sternotomy. There is mild prominence of the pulmonary arteries which may reflect a degree of pulmonary arterial  hypertension. No evidence of pulmonary emboli. There is mild worsening bilateral hilar and mediastinal adenopathy. The right hilar lymph node measures 1.6 cm, subcarinal lymph node 1.4 cm and prevascular lymph node 1.3 cm by short axis. Calcified plaque over the thoracic aorta. Remaining mediastinal structures are unremarkable. A few small right pericardial phrenic lymph nodes. CT ABDOMEN and PELVIS FINDINGS Abdominal images demonstrate mild gallbladder wall thickening with ill definition of the adjacent fat which could be seen in acute cholecystitis. Mild fluid adjacent the duodenal C-sweep. The liver, spleen, pancreas and adrenal glands are within normal. Kidneys are normal in size with several bilateral stable cysts and mild bilateral nephrolithiasis. No evidence of hydronephrosis. Ureters are normal. There is calcified plaque over the abdominal aorta. Stomach is within normal. Small bowel is unremarkable. Appendix is not visualized. Surgical clips are present over the region of the cecum/ascending colon. Pelvic images demonstrate a  small amount of free fluid over the midline. The bladder is decompressed. Prostate and rectum are normal. There are degenerative changes throughout the spine. Multilevel disc disease over the lumbar spine. Grade 1 anterolisthesis of L4 on L5. Minimal degenerate change of the hips. 2 cm oval lytic lesion over the left femoral neck unchanged from 2011. Review of the MIP images confirms the above findings. IMPRESSION: Interval development of moderate size right effusion and small left effusion with hazy airspace process over the left lower lobe/ lingular as findings may be due to infection. Worsening bilateral hilar and mediastinal adenopathy as described. Cavity lesion over the left apex unchanged in size although slight interval wall thickening without intracavitary solid component. Cannot exclude an infectious process such as chronic TB or fungal disease. Neoplasm would be less  likely. No evidence of pulmonary embolism. Gallbladder wall thickening with suggestion of adjacent inflammatory change as findings may be due to acute cholecystitis. Minimal adjacent free fluid and minimal fluid over the upper pelvis. Recommend clinical correlation and right upper quadrant ultrasound if indicated. Bilateral renal cysts and nephrolithiasis unchanged. Mild cardiomegaly. Electronically Signed   By: Marin Olp M.D.   On: 10/28/2015 22:03   Ct Abdomen Pelvis W Contrast  10/28/2015  CLINICAL DATA:  Intermittent chest pain the past couple months left-sided and and abdomen. Some shortness-of-breath. EXAM: CT ANGIOGRAPHY CHEST CT ABDOMEN AND PELVIS WITH CONTRAST TECHNIQUE: Multidetector CT imaging of the chest was performed using the standard protocol during bolus administration of intravenous contrast. Multiplanar CT image reconstructions and MIPs were obtained to evaluate the vascular anatomy. Multidetector CT imaging of the abdomen and pelvis was performed using the standard protocol during bolus administration of intravenous contrast. CONTRAST:  75 mL Isovue 370 IV COMPARISON:  None. CT chest, abdomen and pelvis 05/04/2015 and chest CT 12/21/2014. Abdominal/pelvic CT 08/01/2009 FINDINGS: CTA CHEST FINDINGS Lungs are adequately inflated demonstrate interval development of a moderate size right pleural effusion and small left pleural effusion. There is patchy hazy airspace opacification over the left lower lobe which is new. Again noted is a 2.9 cm cavitary lesion over the left apex as the wall is slightly thicker compared to the prior exams although size unchanged. No intracavitary solid component. Airways are within normal. Mild cardiomegaly. Evidence of previous median sternotomy. There is mild prominence of the pulmonary arteries which may reflect a degree of pulmonary arterial hypertension. No evidence of pulmonary emboli. There is mild worsening bilateral hilar and mediastinal adenopathy. The  right hilar lymph node measures 1.6 cm, subcarinal lymph node 1.4 cm and prevascular lymph node 1.3 cm by short axis. Calcified plaque over the thoracic aorta. Remaining mediastinal structures are unremarkable. A few small right pericardial phrenic lymph nodes. CT ABDOMEN and PELVIS FINDINGS Abdominal images demonstrate mild gallbladder wall thickening with ill definition of the adjacent fat which could be seen in acute cholecystitis. Mild fluid adjacent the duodenal C-sweep. The liver, spleen, pancreas and adrenal glands are within normal. Kidneys are normal in size with several bilateral stable cysts and mild bilateral nephrolithiasis. No evidence of hydronephrosis. Ureters are normal. There is calcified plaque over the abdominal aorta. Stomach is within normal. Small bowel is unremarkable. Appendix is not visualized. Surgical clips are present over the region of the cecum/ascending colon. Pelvic images demonstrate a small amount of free fluid over the midline. The bladder is decompressed. Prostate and rectum are normal. There are degenerative changes throughout the spine. Multilevel disc disease over the lumbar spine. Grade 1 anterolisthesis of L4  on L5. Minimal degenerate change of the hips. 2 cm oval lytic lesion over the left femoral neck unchanged from 2011. Review of the MIP images confirms the above findings. IMPRESSION: Interval development of moderate size right effusion and small left effusion with hazy airspace process over the left lower lobe/ lingular as findings may be due to infection. Worsening bilateral hilar and mediastinal adenopathy as described. Cavity lesion over the left apex unchanged in size although slight interval wall thickening without intracavitary solid component. Cannot exclude an infectious process such as chronic TB or fungal disease. Neoplasm would be less likely. No evidence of pulmonary embolism. Gallbladder wall thickening with suggestion of adjacent inflammatory change as  findings may be due to acute cholecystitis. Minimal adjacent free fluid and minimal fluid over the upper pelvis. Recommend clinical correlation and right upper quadrant ultrasound if indicated. Bilateral renal cysts and nephrolithiasis unchanged. Mild cardiomegaly. Electronically Signed   By: Marin Olp M.D.   On: 10/28/2015 22:03   Dg Chest Portable 1 View  10/28/2015  CLINICAL DATA:  Dyspnea. Intermittent chest pain for months. Left-sided chest and abdominal pain. Shortness of breath. EXAM: PORTABLE CHEST 1 VIEW COMPARISON:  Chest CT 05/04/2015. Radiographs and 07/12/2015 and 02/01/2015 reports, images not available for review. FINDINGS: Patient is post median sternotomy. The heart is enlarged. Small pleural effusions and bibasilar opacities, favor atelectasis. Mild vascular congestion without alveolar edema. No pneumothorax left apical bleb on prior CT not well seen radiographically. Remote left rib fracture. IMPRESSION: 1. Cardiomegaly. Small pleural effusions and probable bibasilar atelectasis. 2. Mild vascular congestion, no alveolar edema. 3. Overall findings may reflect fluid overload. Electronically Signed   By: Jeb Levering M.D.   On: 10/28/2015 19:38   US Abdomen Limited Ruq  10/29/2015  CLINICAL DATA:  Upper abdominal pain for years, shortness of breath. Abnormal gallbladder on CT. EXAM: US ABDOMEN LIMITED - RIGHT UPPER QUADRANT COMPARISON:  CT earlier this day. FINDINGS: Gallbladder: Physiologically distended. No gallstones. Diffuse gallbladder wall thickening measuring 5-6 mm. Trace pericholecystic fluid. No sonographic Murphy sign noted by sonographer. Common bile duct: Diameter: 2 mm. Liver: No focal lesion identified. Within normal limits in parenchymal echogenicity. Normal directional flow in the main portal vein. Incidental: Right pleural effusion.  Right renal cysts. IMPRESSION: Diffuse gallbladder wall thickening with trace pericholecystic fluid. No gallstones. Findings may reflect  acalculus cholecystitis, acute or chronic versus sequela of liver disease. No biliary dilatation. Electronically Signed   By: Jeb Levering M.D.   On: 10/29/2015 00:10    Review of Systems  Constitutional: Positive for malaise/fatigue.  HENT: Positive for congestion.   Eyes: Negative.   Respiratory: Positive for shortness of breath.   Cardiovascular: Positive for palpitations, orthopnea, leg swelling and PND.  Gastrointestinal: Positive for abdominal pain.  Genitourinary: Negative.   Musculoskeletal: Negative.   Skin: Negative.   Neurological: Positive for weakness.  Endo/Heme/Allergies: Negative.   Psychiatric/Behavioral: Negative.    Blood pressure 92/56, pulse 76, temperature 97.9 F (36.6 C), temperature source Oral, resp. rate 19, height 6' 4"  (1.93 m), weight 91.491 kg (201 lb 11.2 oz), SpO2 97 %. Physical Exam  Nursing note and vitals reviewed. Constitutional: He is oriented to person, place, and time. He appears well-developed and well-nourished.  HENT:  Head: Normocephalic and atraumatic.  Eyes: Conjunctivae and EOM are normal. Pupils are equal, round, and reactive to light.  Neck: Normal range of motion. Neck supple.  Cardiovascular: Normal rate and normal pulses.  An irregularly irregular rhythm present. Frequent extrasystoles  are present. Exam reveals gallop and S3.   Murmur heard.  Systolic murmur is present with a grade of 2/6   Diastolic murmur is present with a grade of 2/6  Respiratory: Effort normal and breath sounds normal.  GI: Soft. Bowel sounds are normal.  Musculoskeletal: Normal range of motion.  Neurological: He is alert and oriented to person, place, and time. He has normal reflexes.  Skin: Skin is warm and dry.  Psychiatric: He has a normal mood and affect.    Assessment/Plan: Congestive Heart Failure  cardiomyopathy  hypertension  atrial fibrillation  abdominal pain  possible acalculous cholecystitis  coronary disease  hyperlipidemia   anemia  GERD  bilateral leg  chronic renal insufficiency . PLAN  agreed to admission ROMI infarction  agree with heart failure therapy with Lasix  will continue metoprolol consider ACE inhibitor or ARB  because of chronic renal sufficiency will consider him door and hydralazine  GERD continue Protonix therapy as well as Carafate  agree with Eliquis for his atrial fibrillation  continue metoprolol for rate control for AFib  antibiotic therapy as per surgery  continue Lipitor for hyperlipidemia  continue Iron  therapy for anemia  recommend conservative medical therapy for moderate heart failure symptoms  consider AICD as an outpatient    Wise Fees D. 10/30/2015, 3:52 PM

## 2015-10-30 NOTE — Progress Notes (Signed)
[facility] [place] NAME: Andres Gutierrez    MR#:  [id number]  DATE OF BIRTH:  1930-10-11  SUBJECTIVE: Feels better. No shortness of breath or abdominal pain. HIDA scan is negative. No fever.   CHIEF COMPLAINT:   Chief Complaint  Patient presents with  . Chest Pain    REVIEW OF SYSTEMS:    Review of Systems  Constitutional: Negative for fever and chills.  HENT: Negative for hearing loss.   Eyes: Negative for blurred vision, double vision and photophobia.  Respiratory: Negative for cough, hemoptysis and shortness of breath.   Cardiovascular: Negative for palpitations, orthopnea and leg swelling.  Gastrointestinal: Negative for nausea, vomiting, abdominal pain and diarrhea.       Right upper quadrant abdominal pain present  Genitourinary: Negative for dysuria and urgency.  Musculoskeletal: Negative for myalgias and neck pain.  Skin: Negative for rash.  Neurological: Negative for dizziness, focal weakness, seizures, weakness and headaches.  Psychiatric/Behavioral: Negative for memory loss. The patient does not have insomnia.     Nutrition:  Tolerating Diet: Tolerating PT:      DRUG ALLERGIES:   Allergies  Allergen Reactions  . Diphenhydramine Hcl Other (See Comments)    Reaction:  Unknown   . Escitalopram Other (See Comments)    Reaction:  Makes him feel faint   . Maxidex [Dexamethasone] Other (See Comments)    Reaction:  Unknown   . Prednisone Other (See Comments)    Pt states that this medication made him feel crazy.    . Serotonin Other (See Comments)    Reaction:  Unknown   . Vytorin [Ezetimibe-Simvastatin] Other (See Comments)    Reaction:  Unknown   . Zocor [Simvastatin] Other (See Comments)    Reaction:  Unknown     VITALS:  Blood pressure 92/62, pulse 75, temperature 97.4 F (36.3 C), temperature source Oral, resp. rate 18, height 6\' 4"  (1.93 m), weight 91.491 kg (201 lb 11.2 oz), SpO2 98 %.  PHYSICAL  EXAMINATION:   Physical Exam  GENERAL:  82 y.o.-year-old patient lying in the bed with no acute distress.  EYES: Pupils equal, round, reactive to light and accommodation. No scleral icterus. Extraocular muscles intact.  HEENT: Head atraumatic, normocephalic. Oropharynx and nasopharynx clear.  NECK:  Supple, no jugular venous distention. No thyroid enlargement, no tenderness.  LUNGS: Normal breath sounds bilaterally, no wheezing, rales,rhonchi or crepitation. No use of accessory muscles of respiration.  CARDIOVASCULAR: S1, S2 normal. No murmurs, rubs, or gallops.  ABDOMEN: Soft, nontender, nondistended. Bowel sounds present. No organomegaly or mass.  EXTREMITIES: No pedal edema, cyanosis, or clubbing.  NEUROLOGIC: Cranial nerves II through XII are intact. Muscle strength 5/5 in all extremities. Sensation intact. Gait not checked.  PSYCHIATRIC: The patient is alert and oriented x 3.  SKIN: No obvious rash, lesion, or ulcer.    LABORATORY PANEL:   CBC  Recent Labs Lab 10/30/15 0256  WBC 5.8  HGB 11.4*  HCT 34.2*  PLT 89*   ------------------------------------------------------------------------------------------------------------------  Chemistries   Recent Labs Lab 10/28/15 1900  10/30/15 0256  NA 137  < > 137  K 4.4  < > 3.7  CL 106  < > 104  CO2 19*  < > 22  GLUCOSE 115*  < > 84  BUN 32*  < > 36*  CREATININE 1.56*  < > 1.59*  CALCIUM 9.6  < > 8.4*  AST 24  --   --   ALT 23  --   --  ALKPHOS 57  --   --   BILITOT 1.8*  --   --   < > = values in this interval not displayed. ------------------------------------------------------------------------------------------------------------------  Cardiac Enzymes  Recent Labs Lab 10/29/15 1251  TROPONINI 0.03   ------------------------------------------------------------------------------------------------------------------  RADIOLOGY:  Ct Angio Chest Pe W/cm &/or Wo Cm  10/28/2015  CLINICAL DATA:  Intermittent  chest pain the past couple months left-sided and and abdomen. Some shortness-of-breath. EXAM: CT ANGIOGRAPHY CHEST CT ABDOMEN AND PELVIS WITH CONTRAST TECHNIQUE: Multidetector CT imaging of the chest was performed using the standard protocol during bolus administration of intravenous contrast. Multiplanar CT image reconstructions and MIPs were obtained to evaluate the vascular anatomy. Multidetector CT imaging of the abdomen and pelvis was performed using the standard protocol during bolus administration of intravenous contrast. CONTRAST:  75 mL Isovue 370 IV COMPARISON:  None. CT chest, abdomen and pelvis 05/04/2015 and chest CT 12/21/2014. Abdominal/pelvic CT 08/01/2009 FINDINGS: CTA CHEST FINDINGS Lungs are adequately inflated demonstrate interval development of a moderate size right pleural effusion and small left pleural effusion. There is patchy hazy airspace opacification over the left lower lobe which is new. Again noted is a 2.9 cm cavitary lesion over the left apex as the wall is slightly thicker compared to the prior exams although size unchanged. No intracavitary solid component. Airways are within normal. Mild cardiomegaly. Evidence of previous median sternotomy. There is mild prominence of the pulmonary arteries which may reflect a degree of pulmonary arterial hypertension. No evidence of pulmonary emboli. There is mild worsening bilateral hilar and mediastinal adenopathy. The right hilar lymph node measures 1.6 cm, subcarinal lymph node 1.4 cm and prevascular lymph node 1.3 cm by short axis. Calcified plaque over the thoracic aorta. Remaining mediastinal structures are unremarkable. A few small right pericardial phrenic lymph nodes. CT ABDOMEN and PELVIS FINDINGS Abdominal images demonstrate mild gallbladder wall thickening with ill definition of the adjacent fat which could be seen in acute cholecystitis. Mild fluid adjacent the duodenal C-sweep. The liver, spleen, pancreas and adrenal glands are  within normal. Kidneys are normal in size with several bilateral stable cysts and mild bilateral nephrolithiasis. No evidence of hydronephrosis. Ureters are normal. There is calcified plaque over the abdominal aorta. Stomach is within normal. Small bowel is unremarkable. Appendix is not visualized. Surgical clips are present over the region of the cecum/ascending colon. Pelvic images demonstrate a small amount of free fluid over the midline. The bladder is decompressed. Prostate and rectum are normal. There are degenerative changes throughout the spine. Multilevel disc disease over the lumbar spine. Grade 1 anterolisthesis of L4 on L5. Minimal degenerate change of the hips. 2 cm oval lytic lesion over the left femoral neck unchanged from 2011. Review of the MIP images confirms the above findings. IMPRESSION: Interval development of moderate size right effusion and small left effusion with hazy airspace process over the left lower lobe/ lingular as findings may be due to infection. Worsening bilateral hilar and mediastinal adenopathy as described. Cavity lesion over the left apex unchanged in size although slight interval wall thickening without intracavitary solid component. Cannot exclude an infectious process such as chronic TB or fungal disease. Neoplasm would be less likely. No evidence of pulmonary embolism. Gallbladder wall thickening with suggestion of adjacent inflammatory change as findings may be due to acute cholecystitis. Minimal adjacent free fluid and minimal fluid over the upper pelvis. Recommend clinical correlation and right upper quadrant ultrasound if indicated. Bilateral renal cysts and nephrolithiasis unchanged. Mild cardiomegaly. Electronically Signed  By: Marin Olp M.D.   On: 10/28/2015 22:03   Ct Abdomen Pelvis W Contrast  10/28/2015  CLINICAL DATA:  Intermittent chest pain the past couple months left-sided and and abdomen. Some shortness-of-breath. EXAM: CT ANGIOGRAPHY CHEST CT  ABDOMEN AND PELVIS WITH CONTRAST TECHNIQUE: Multidetector CT imaging of the chest was performed using the standard protocol during bolus administration of intravenous contrast. Multiplanar CT image reconstructions and MIPs were obtained to evaluate the vascular anatomy. Multidetector CT imaging of the abdomen and pelvis was performed using the standard protocol during bolus administration of intravenous contrast. CONTRAST:  75 mL Isovue 370 IV COMPARISON:  None. CT chest, abdomen and pelvis 05/04/2015 and chest CT 12/21/2014. Abdominal/pelvic CT 08/01/2009 FINDINGS: CTA CHEST FINDINGS Lungs are adequately inflated demonstrate interval development of a moderate size right pleural effusion and small left pleural effusion. There is patchy hazy airspace opacification over the left lower lobe which is new. Again noted is a 2.9 cm cavitary lesion over the left apex as the wall is slightly thicker compared to the prior exams although size unchanged. No intracavitary solid component. Airways are within normal. Mild cardiomegaly. Evidence of previous median sternotomy. There is mild prominence of the pulmonary arteries which may reflect a degree of pulmonary arterial hypertension. No evidence of pulmonary emboli. There is mild worsening bilateral hilar and mediastinal adenopathy. The right hilar lymph node measures 1.6 cm, subcarinal lymph node 1.4 cm and prevascular lymph node 1.3 cm by short axis. Calcified plaque over the thoracic aorta. Remaining mediastinal structures are unremarkable. A few small right pericardial phrenic lymph nodes. CT ABDOMEN and PELVIS FINDINGS Abdominal images demonstrate mild gallbladder wall thickening with ill definition of the adjacent fat which could be seen in acute cholecystitis. Mild fluid adjacent the duodenal C-sweep. The liver, spleen, pancreas and adrenal glands are within normal. Kidneys are normal in size with several bilateral stable cysts and mild bilateral nephrolithiasis. No  evidence of hydronephrosis. Ureters are normal. There is calcified plaque over the abdominal aorta. Stomach is within normal. Small bowel is unremarkable. Appendix is not visualized. Surgical clips are present over the region of the cecum/ascending colon. Pelvic images demonstrate a small amount of free fluid over the midline. The bladder is decompressed. Prostate and rectum are normal. There are degenerative changes throughout the spine. Multilevel disc disease over the lumbar spine. Grade 1 anterolisthesis of L4 on L5. Minimal degenerate change of the hips. 2 cm oval lytic lesion over the left femoral neck unchanged from 2011. Review of the MIP images confirms the above findings. IMPRESSION: Interval development of moderate size right effusion and small left effusion with hazy airspace process over the left lower lobe/ lingular as findings may be due to infection. Worsening bilateral hilar and mediastinal adenopathy as described. Cavity lesion over the left apex unchanged in size although slight interval wall thickening without intracavitary solid component. Cannot exclude an infectious process such as chronic TB or fungal disease. Neoplasm would be less likely. No evidence of pulmonary embolism. Gallbladder wall thickening with suggestion of adjacent inflammatory change as findings may be due to acute cholecystitis. Minimal adjacent free fluid and minimal fluid over the upper pelvis. Recommend clinical correlation and right upper quadrant ultrasound if indicated. Bilateral renal cysts and nephrolithiasis unchanged. Mild cardiomegaly. Electronically Signed   By: Marin Olp M.D.   On: 10/28/2015 22:03   Dg Chest Portable 1 View  10/28/2015  CLINICAL DATA:  Dyspnea. Intermittent chest pain for months. Left-sided chest and abdominal pain. Shortness of  breath. EXAM: PORTABLE CHEST 1 VIEW COMPARISON:  Chest CT 05/04/2015. Radiographs and 07/12/2015 and 02/01/2015 reports, images not available for review.  FINDINGS: Patient is post median sternotomy. The heart is enlarged. Small pleural effusions and bibasilar opacities, favor atelectasis. Mild vascular congestion without alveolar edema. No pneumothorax left apical bleb on prior CT not well seen radiographically. Remote left rib fracture. IMPRESSION: 1. Cardiomegaly. Small pleural effusions and probable bibasilar atelectasis. 2. Mild vascular congestion, no alveolar edema. 3. Overall findings may reflect fluid overload. Electronically Signed   By: Jeb Levering M.D.   On: 10/28/2015 19:38   US Abdomen Limited Ruq  10/29/2015  CLINICAL DATA:  Upper abdominal pain for years, shortness of breath. Abnormal gallbladder on CT. EXAM: US ABDOMEN LIMITED - RIGHT UPPER QUADRANT COMPARISON:  CT earlier this day. FINDINGS: Gallbladder: Physiologically distended. No gallstones. Diffuse gallbladder wall thickening measuring 5-6 mm. Trace pericholecystic fluid. No sonographic Murphy sign noted by sonographer. Common bile duct: Diameter: 2 mm. Liver: No focal lesion identified. Within normal limits in parenchymal echogenicity. Normal directional flow in the main portal vein. Incidental: Right pleural effusion.  Right renal cysts. IMPRESSION: Diffuse gallbladder wall thickening with trace pericholecystic fluid. No gallstones. Findings may reflect acalculus cholecystitis, acute or chronic versus sequela of liver disease. No biliary dilatation. Electronically Signed   By: Jeb Levering M.D.   On: 10/29/2015 00:10     ASSESSMENT AND PLAN:   Active Problems:   Acalculous cholecystitis   #1 acalculus cholecystitis: ;Symptoms improved with empiric treatment. Normal HIDA scan with normal ejection fraction of gallbladder. Surgery thinks the symptoms are because of congestive heart failure flareup rather than gallbladder problem. We will start the patient on soft diet today. No abdominal pain nausea or vomiting. he feels much better today. Continue IV Zosyn.  #2 mild.on  chronic systolic heart failure ; Continue IV Lasix, continue oxygen, and O2 sats 97% on 2 L. patient feels better. Continue IV diuretics for today, changed to by mouth Lasix tomorrow. #3 atrial fibrillation: Rate controlled ; discontinue heparin drip, start the patient on oral anticoagulants. #3 history of CAD, triple bypass long time ago: No chest pain. Patient is on now beta blockers, statins and now patient follows up with Dr. Ubaldo Glassing. Elevated cardiology. Patient has a history of dilated cardiomyopathy, EF 20-25%. By echo yesterday. Chronic kidney disease stage III;stable    All the records are reviewed and case discussed with Care Management/Social Workerr. Management plans discussed with the patient, family and they are in agreement.  CODE STATUS: full  TOTAL TIME TAKING CARE OF THIS PATIENT: 19minutes.   POSSIBLE D/C IN 2-3DAYS, DEPENDING ON CLINICAL CONDITION.   Epifanio Lesches M.D on 10/30/2015 at 10:58 AM  Between 7am to 6pm - Pager - [phone]  After 6pm go to [email or web address] - password [email or web address]  Office  [phone]  CC: Primary care physician; Rusty Aus, MD

## 2015-10-30 NOTE — Progress Notes (Signed)
Pt had quiet, comfortable day. Family in to visit. elequis resumed and hep gtt d/c'd. Less sob. Voiding successfully in urinal.. Dr. Clayborn Bigness in to consult.

## 2015-10-30 NOTE — Progress Notes (Signed)
ANTICOAGULATION CONSULT NOTE - Initial Consult  Pharmacy Consult for heparin drip Indication: atrial fibrillation  Allergies  Allergen Reactions  . Diphenhydramine Hcl Other (See Comments)    Reaction:  Unknown   . Escitalopram Other (See Comments)    Reaction:  Makes him feel faint   . Maxidex [Dexamethasone] Other (See Comments)    Reaction:  Unknown   . Prednisone Other (See Comments)    Pt states that this medication made him feel crazy.    . Serotonin Other (See Comments)    Reaction:  Unknown   . Vytorin [Ezetimibe-Simvastatin] Other (See Comments)    Reaction:  Unknown   . Zocor [Simvastatin] Other (See Comments)    Reaction:  Unknown     Patient Measurements: Height: 6\' 4"  (193 cm) Weight: 201 lb 11.2 oz (91.491 kg) IBW/kg (Calculated) : 86.8 Heparin Dosing Weight: 95.2kg  Vital Signs: Temp: 97.4 F (36.3 C) (06/18 0800) Temp Source: Oral (06/18 0800) BP: 92/62 mmHg (06/18 0800) Pulse Rate: 75 (06/18 0800)  Labs:  Recent Labs  10/28/15 1900  10/29/15 0204 10/29/15 0648 10/29/15 1025 10/29/15 1251 10/29/15 1919 10/30/15 0256 10/30/15 0732  HGB 12.4*  --   --  12.5*  --   --   --  11.4*  --   HCT 38.0*  --   --  38.2*  --   --   --  34.2*  --   PLT 106*  --   --  106*  --   --   --  89*  --   APTT  --   < > 38*  --  >160*  --  >160* >160* 99*  LABPROT  --   --  19.2*  --   --   --   --   --   --   INR  --   --  1.61  --   --   --   --   --   --   HEPARINUNFRC  --   --  2.00*  --  2.00*  --   --  1.83*  --   CREATININE 1.56*  --   --  1.52*  --   --   --  1.59*  --   TROPONINI 0.05*  --  0.04* 0.05*  --  0.03  --   --   --   < > = values in this interval not displayed.  Estimated Creatinine Clearance: 44 mL/min (by C-G formula based on Cr of 1.59).   Medications:  On eliquis as outpatient, last dose 6/16 at 0800  Assessment:  Goal of Therapy:  Heparin level 0.3-0.7 units/ml  aPTT goal  66-102. Monitor platelets by anticoagulation protocol:  Yes   Plan:  Current orders for heparin 700 units/hr APTT continued to be elevated despite multiple rate reductions. Spoke with RN, labs drawn from R forearm, heparin running in R forearm as well. Repeat aPTT drawn from LEFT arm shows therapeutic at 99sec.  Rate reduced this AM at 0600 - will continue current rate and recheck aPTT at 12:00. Note left for lab to draw from left arm. Please make sure future lab draws are done distant to site of heparin infusion.  Will follow and adjust by aPTT until correlation with heparin level.  CBC and HL with AM labs  Rexene Edison, PharmD Clinical Pharmacist  10/30/2015,8:33 AM

## 2015-10-30 NOTE — Progress Notes (Signed)
Less dyspnea noted this shift.  Periods of intermittent confusion on awakening throughout the night.  Redirects easily.

## 2015-10-31 ENCOUNTER — Inpatient Hospital Stay: Payer: PPO

## 2015-10-31 DIAGNOSIS — J189 Pneumonia, unspecified organism: Principal | ICD-10-CM

## 2015-10-31 DIAGNOSIS — I482 Chronic atrial fibrillation, unspecified: Secondary | ICD-10-CM | POA: Insufficient documentation

## 2015-10-31 DIAGNOSIS — I2510 Atherosclerotic heart disease of native coronary artery without angina pectoris: Secondary | ICD-10-CM | POA: Insufficient documentation

## 2015-10-31 LAB — CBC
HEMATOCRIT: 35 % — AB (ref 40.0–52.0)
HEMOGLOBIN: 11.8 g/dL — AB (ref 13.0–18.0)
MCH: 32.1 pg (ref 26.0–34.0)
MCHC: 33.8 g/dL (ref 32.0–36.0)
MCV: 95.2 fL (ref 80.0–100.0)
Platelets: 93 10*3/uL — ABNORMAL LOW (ref 150–440)
RBC: 3.68 MIL/uL — AB (ref 4.40–5.90)
RDW: 17.9 % — AB (ref 11.5–14.5)
WBC: 8.8 10*3/uL (ref 3.8–10.6)

## 2015-10-31 LAB — BASIC METABOLIC PANEL
ANION GAP: 12 (ref 5–15)
BUN: 42 mg/dL — ABNORMAL HIGH (ref 6–20)
CO2: 25 mmol/L (ref 22–32)
Calcium: 8.9 mg/dL (ref 8.9–10.3)
Chloride: 101 mmol/L (ref 101–111)
Creatinine, Ser: 1.81 mg/dL — ABNORMAL HIGH (ref 0.61–1.24)
GFR, EST AFRICAN AMERICAN: 38 mL/min — AB (ref >60)
GFR, EST NON AFRICAN AMERICAN: 33 mL/min — AB (ref >60)
Glucose, Bld: 93 mg/dL (ref 65–99)
POTASSIUM: 3.4 mmol/L — AB (ref 3.5–5.1)
SODIUM: 138 mmol/L (ref 135–145)

## 2015-10-31 MED ORDER — DIGOXIN 125 MCG PO TABS
0.1250 mg | ORAL_TABLET | Freq: Every day | ORAL | Status: DC
  Administered 2015-10-31 – 2015-11-01 (×2): 0.125 mg via ORAL
  Filled 2015-10-31 (×2): qty 1

## 2015-10-31 MED ORDER — TECHNETIUM TC 99M MEBROFENIN IV KIT
5.4330 | PACK | Freq: Once | INTRAVENOUS | Status: AC | PRN
  Administered 2015-10-31: 5.433 via INTRAVENOUS

## 2015-10-31 NOTE — Care Management Important Message (Signed)
Important Message  Patient Details  Name: Andres Gutierrez MRN: [id number] Date of Birth: 1931/04/07   Medicare Important Message Given:  Yes    Jolly Mango, RN 10/31/2015, 8:42 AM

## 2015-10-31 NOTE — Progress Notes (Signed)
82yr old male with CHF and Left upper quadrant pain.  Patient doing well today states still some pain in LUQ unchanged from admission.  He is eating well states eating does not make the pain worse or better.  Denies any nausea or vomiting.   Filed Vitals:   10/31/15 0451 10/31/15 0900  BP: 94/68 107/78  Pulse: 94 113  Temp:  97.8 F (36.6 C)  Resp:  18   PE:  Gen: NAD Res: CTAB/L  Cardio: regular rate, slight murmur Abd: soft, mild tenderness in LUQ, no RUQ tenderness Ext: no edema  CBC Latest Ref Rng 10/31/2015 10/30/2015 10/29/2015  WBC 3.8 - 10.6 K/uL 8.8 5.8 9.1  Hemoglobin 13.0 - 18.0 g/dL 11.8(L) 11.4(L) 12.5(L)  Hematocrit 40.0 - 52.0 % 35.0(L) 34.2(L) 38.2(L)  Platelets 150 - 440 K/uL 93(L) 89(L) 106(L)    CMP Latest Ref Rng 10/31/2015 10/30/2015 10/29/2015  Glucose 65 - 99 mg/dL 93 84 117(H)  BUN 6 - 20 mg/dL 42(H) 36(H) 31(H)  Creatinine 0.61 - 1.24 mg/dL 1.81(H) 1.59(H) 1.52(H)  Sodium 135 - 145 mmol/L 138 137 138  Potassium 3.5 - 5.1 mmol/L 3.4(L) 3.7 4.6  Chloride 101 - 111 mmol/L 101 104 107  CO2 22 - 32 mmol/L 25 22 21(L)  Calcium 8.9 - 10.3 mg/dL 8.9 8.4(L) 8.9  Total Protein 6.5 - 8.1 g/dL - - -  Total Bilirubin 0.3 - 1.2 mg/dL - - -  Alkaline Phos 38 - 126 U/L - - -  AST 15 - 41 U/L - - -  ALT 17 - 63 U/L - - -    HIDA: EF 48% in normal range  A/P:  82yr old male with CHF and Left upper quadrant pain. HIDA scan with normal EF of gallbladder and only 54mm wall thickening on U/S, likely as a result of CHF, not unusual to see some thickening in this disease process.  He has had gastritis before and was started on protonix BID two or three years ago by PCP because of similar pain which he thinks helped him.  I do not think his pain is gallbladder related but may be due to gastritis would continue PPI BID.

## 2015-10-31 NOTE — Progress Notes (Signed)
PT Cancellation Note  Patient Details Name: Andres Gutierrez MRN: [id number] DOB: 19-Jul-1930   Cancelled Treatment:    Reason Eval/Treat Not Completed: Other (comment). Received consult and chart reviewed. Per RN, pt has ambulated successfully in hallway and has plans to dc home. Pt is at baseline level, does not require PT at this time. Will dc current orders. Please re-order if needs change.   Stewart Pimenta 10/31/2015, 1:54 PM Greggory Stallion, PT, DPT [phone]

## 2015-10-31 NOTE — Progress Notes (Signed)
[facility] NAME: Andres Gutierrez    MR#:  [id number]  DATE OF BIRTH:  March 03, 1931  SUBJECTIVE: Feels better. No shortness of breath or abdominal pain. HIDA scan is negative. No fever.   CHIEF COMPLAINT:   Chief Complaint  Patient presents with  . Chest Pain   Patient complains of discomfort on the left upper side of his abdomen no pain on the right side otherwise is denying any significant complaints  REVIEW OF SYSTEMS:    Review of Systems  Constitutional: Negative for fever and chills.  HENT: Negative for hearing loss.   Eyes: Negative for blurred vision, double vision and photophobia.  Respiratory: Negative for cough, hemoptysis and shortness of breath.   Cardiovascular: Negative for palpitations, orthopnea and leg swelling.  Gastrointestinal: Negative for nausea, vomiting, abdominal pain and diarrhea.      Left upper quadrant of his abdomen  Genitourinary: Negative for dysuria and urgency.  Musculoskeletal: Negative for myalgias and neck pain.  Skin: Negative for rash.  Neurological: Negative for dizziness, focal weakness, seizures, weakness and headaches.  Psychiatric/Behavioral: Negative for memory loss. The patient does not have insomnia.     Nutrition:  Tolerating Diet: Tolerating PT:      DRUG ALLERGIES:   Allergies  Allergen Reactions  . Diphenhydramine Hcl Other (See Comments)    Reaction:  Unknown   . Escitalopram Other (See Comments)    Reaction:  Makes him feel faint   . Maxidex [Dexamethasone] Other (See Comments)    Reaction:  Unknown   . Prednisone Other (See Comments)    Pt states that this medication made him feel crazy.    . Serotonin Other (See Comments)    Reaction:  Unknown   . Vytorin [Ezetimibe-Simvastatin] Other (See Comments)    Reaction:  Unknown   . Zocor [Simvastatin] Other (See Comments)    Reaction:  Unknown     VITALS:  Blood pressure 107/78, pulse 113, temperature 97.8  F (36.6 C), temperature source Oral, resp. rate 18, height 6\' 4"  (1.93 m), weight 89.676 kg (197 lb 11.2 oz), SpO2 97 %.  PHYSICAL EXAMINATION:   Physical Exam  GENERAL:  82 y.o.-year-old patient lying in the bed with no acute distress.  EYES: Pupils equal, round, reactive to light and accommodation. No scleral icterus. Extraocular muscles intact.  HEENT: Head atraumatic, normocephalic. Oropharynx and nasopharynx clear.  NECK:  Supple, no jugular venous distention. No thyroid enlargement, no tenderness.  LUNGS: Normal breath sounds bilaterally, no wheezing, rales,rhonchi or crepitation. No use of accessory muscles of respiration.  CARDIOVASCULAR: S1, S2 normal. No murmurs, rubs, or gallops.  ABDOMEN: Soft, nontender, nondistended. Bowel sounds present. No organomegaly or mass.  EXTREMITIES: No pedal edema, cyanosis, or clubbing.  NEUROLOGIC: Cranial nerves II through XII are intact. Muscle strength 5/5 in all extremities. Sensation intact. Gait not checked.  PSYCHIATRIC: The patient is alert and oriented x 3.  SKIN: No obvious rash, lesion, or ulcer.    LABORATORY PANEL:   CBC  Recent Labs Lab 10/31/15 0431  WBC 8.8  HGB 11.8*  HCT 35.0*  PLT 93*   ------------------------------------------------------------------------------------------------------------------  Chemistries   Recent Labs Lab 10/28/15 1900  10/31/15 0431  NA 137  < > 138  K 4.4  < > 3.4*  CL 106  < > 101  CO2 19*  < > 25  GLUCOSE 115*  < > 93  BUN 32*  < > 42*  CREATININE 1.56*  < >  1.81*  CALCIUM 9.6  < > 8.9  AST 24  --   --   ALT 23  --   --   ALKPHOS 57  --   --   BILITOT 1.8*  --   --   < > = values in this interval not displayed. ------------------------------------------------------------------------------------------------------------------  Cardiac Enzymes  Recent Labs Lab 10/29/15 1251  TROPONINI 0.03    ------------------------------------------------------------------------------------------------------------------  RADIOLOGY:  No results found.   ASSESSMENT AND PLAN:   Active Problems:   Acalculous cholecystitis   #1 acalculus cholecystitis:  No evidence of cholecystitis HIDA scan negative Seen by surgery Advance diet We'll discontinue IV Zosyn put him on Augmentin  #2 mild.on chronic systolic heart failure ;  his renal functions worsen I'll stop IV Lasix #3 atrial fibrillation: Rate controlled ; restarted on eliquis  #4history of CAD, triple bypass seen by cardiology recommended continued therapy  #5. gerd continue protonix   All the records are reviewed and case discussed with Care Management/Social Workerr. Management plans discussed with the patient, family and they are in agreement.  CODE STATUS: full  TOTAL TIME TAKING CARE OF THIS PATIENT: 33minutes.   POSSIBLE D/C IN 2-3DAYS, DEPENDING ON CLINICAL CONDITION.   Dustin Flock M.D on 10/31/2015 at 2:02 PM  Between 7am to 6pm - Pager - [phone]  After 6pm go to [email or web address] - password [email or web address]  Office  [phone]  CC: Primary care physician; Rusty Aus, MD

## 2015-11-01 LAB — BASIC METABOLIC PANEL
ANION GAP: 11 (ref 5–15)
BUN: 39 mg/dL — AB (ref 6–20)
CHLORIDE: 101 mmol/L (ref 101–111)
CO2: 27 mmol/L (ref 22–32)
Calcium: 9 mg/dL (ref 8.9–10.3)
Creatinine, Ser: 1.46 mg/dL — ABNORMAL HIGH (ref 0.61–1.24)
GFR calc Af Amer: 50 mL/min — ABNORMAL LOW (ref >60)
GFR calc non Af Amer: 43 mL/min — ABNORMAL LOW (ref >60)
Glucose, Bld: 107 mg/dL — ABNORMAL HIGH (ref 65–99)
POTASSIUM: 3.6 mmol/L (ref 3.5–5.1)
SODIUM: 139 mmol/L (ref 135–145)

## 2015-11-01 MED ORDER — METOPROLOL TARTRATE 50 MG PO TABS
50.0000 mg | ORAL_TABLET | Freq: Two times a day (BID) | ORAL | Status: DC
  Administered 2015-11-01: 50 mg via ORAL
  Filled 2015-11-01: qty 1

## 2015-11-01 MED ORDER — ASCORBIC ACID 250 MG PO TABS: 250.0000 mg | ORAL_TABLET | Freq: Every day | ORAL | Status: DC

## 2015-11-01 MED ORDER — METOPROLOL TARTRATE 50 MG PO TABS: 50.0000 mg | ORAL_TABLET | Freq: Two times a day (BID) | ORAL | Status: DC

## 2015-11-01 MED ORDER — FERROUS SULFATE 325 (65 FE) MG PO TABS: 325.0000 mg | ORAL_TABLET | Freq: Every day | ORAL | Status: DC

## 2015-11-01 MED ORDER — DIGOXIN 125 MCG PO TABS: 0.1250 mg | ORAL_TABLET | Freq: Every day | ORAL | Status: DC

## 2015-11-01 NOTE — Progress Notes (Signed)
Pt. Discharged to home via wc. Discharge instructions and medication regimen reviewed at bedside with patient. Pt. verbalizes understanding of instructions and medication regimen. Prescriptions sent to pharmacy, pt aware. Patient assessment unchanged from this morning. TELE and IV discontinued per policy.   

## 2015-11-01 NOTE — Progress Notes (Signed)
AMbulated once around nursing station with nurse at side. No s/s distress.

## 2015-11-01 NOTE — Discharge Summary (Signed)
Andres Gutierrez, 82 y.o., DOB April 01, 1931, MRN [id number]. Admission date: 10/28/2015 Discharge Date 11/01/2015 Primary MD Rusty Aus, MD Admitting Physician Alesia Richards, MD  Admission Diagnosis  Tachycardia [R00.0] Abdominal pain Chronic atrial fibrillation [facility]) [I48.2] Coronary artery disease involving native coronary artery of native heart without angina pectoris [I25.10] Acute on chronic right and left-sided heart failure   Discharge Diagnosis   Active Problems: Abdominal pain Atypical pneumonia True fibrillation with rapid ventricular rate Coronary artery disease involving native coronary artery of native heart without angina pectoris Acute on chronic right and left-sided heart failure         Hospital Course patient is an 82 year old with known history of coronary artery disease status post MI and CABG, atrial fibrillation, iron deficiency anemia, chronic thrombocytopenia and hyperlipidemia presented with intermittent shortness of breath as well as having abdominal pain. Patient had a CT scan of the abdomen pelvis which showed gallbladder wall thickening suggestive of acute cholecystitis. Patient underwent a ultrasound of his abdomen which showed diffuse gallbladder wall thickening with trace occult cystic fluid. There was still persistent concern for acute cholecystitis. He was seen by surgery. Underwent a HIDA scan. They felt that this was more related to heart failure than acute cholecystitis he was treated with anabiotic's his abdominal pain now has resolved. Patient was given diuresis. His heart rate also continue to stay elevated during hospitalization with A. fib with RVR his metoprolol had to be adjusted he also received IV when necessary metoprolol. Patient's abdominal pain is now resolved. He is doing better and is stable for discharge.            Consults  cardiology, surgery  Significant Tests:  See full reports for all details      Ct Angio Chest Pe W/cm  &/or Wo Cm  10/28/2015  CLINICAL DATA:  Intermittent chest pain the past couple months left-sided and and abdomen. Some shortness-of-breath. EXAM: CT ANGIOGRAPHY CHEST CT ABDOMEN AND PELVIS WITH CONTRAST TECHNIQUE: Multidetector CT imaging of the chest was performed using the standard protocol during bolus administration of intravenous contrast. Multiplanar CT image reconstructions and MIPs were obtained to evaluate the vascular anatomy. Multidetector CT imaging of the abdomen and pelvis was performed using the standard protocol during bolus administration of intravenous contrast. CONTRAST:  75 mL Isovue 370 IV COMPARISON:  None. CT chest, abdomen and pelvis 05/04/2015 and chest CT 12/21/2014. Abdominal/pelvic CT 08/01/2009 FINDINGS: CTA CHEST FINDINGS Lungs are adequately inflated demonstrate interval development of a moderate size right pleural effusion and small left pleural effusion. There is patchy hazy airspace opacification over the left lower lobe which is new. Again noted is a 2.9 cm cavitary lesion over the left apex as the wall is slightly thicker compared to the prior exams although size unchanged. No intracavitary solid component. Airways are within normal. Mild cardiomegaly. Evidence of previous median sternotomy. There is mild prominence of the pulmonary arteries which may reflect a degree of pulmonary arterial hypertension. No evidence of pulmonary emboli. There is mild worsening bilateral hilar and mediastinal adenopathy. The right hilar lymph node measures 1.6 cm, subcarinal lymph node 1.4 cm and prevascular lymph node 1.3 cm by short axis. Calcified plaque over the thoracic aorta. Remaining mediastinal structures are unremarkable. A few small right pericardial phrenic lymph nodes. CT ABDOMEN and PELVIS FINDINGS Abdominal images demonstrate mild gallbladder wall thickening with ill definition of the adjacent fat which could be seen in acute cholecystitis. Mild fluid adjacent the duodenal C-sweep.  The liver, spleen,  pancreas and adrenal glands are within normal. Kidneys are normal in size with several bilateral stable cysts and mild bilateral nephrolithiasis. No evidence of hydronephrosis. Ureters are normal. There is calcified plaque over the abdominal aorta. Stomach is within normal. Small bowel is unremarkable. Appendix is not visualized. Surgical clips are present over the region of the cecum/ascending colon. Pelvic images demonstrate a small amount of free fluid over the midline. The bladder is decompressed. Prostate and rectum are normal. There are degenerative changes throughout the spine. Multilevel disc disease over the lumbar spine. Grade 1 anterolisthesis of L4 on L5. Minimal degenerate change of the hips. 2 cm oval lytic lesion over the left femoral neck unchanged from 2011. Review of the MIP images confirms the above findings. IMPRESSION: Interval development of moderate size right effusion and small left effusion with hazy airspace process over the left lower lobe/ lingular as findings may be due to infection. Worsening bilateral hilar and mediastinal adenopathy as described. Cavity lesion over the left apex unchanged in size although slight interval wall thickening without intracavitary solid component. Cannot exclude an infectious process such as chronic TB or fungal disease. Neoplasm would be less likely. No evidence of pulmonary embolism. Gallbladder wall thickening with suggestion of adjacent inflammatory change as findings may be due to acute cholecystitis. Minimal adjacent free fluid and minimal fluid over the upper pelvis. Recommend clinical correlation and right upper quadrant ultrasound if indicated. Bilateral renal cysts and nephrolithiasis unchanged. Mild cardiomegaly. Electronically Signed   By: Marin Olp M.D.   On: 10/28/2015 22:03   Ct Abdomen Pelvis W Contrast  10/28/2015  CLINICAL DATA:  Intermittent chest pain the past couple months left-sided and and abdomen. Some  shortness-of-breath. EXAM: CT ANGIOGRAPHY CHEST CT ABDOMEN AND PELVIS WITH CONTRAST TECHNIQUE: Multidetector CT imaging of the chest was performed using the standard protocol during bolus administration of intravenous contrast. Multiplanar CT image reconstructions and MIPs were obtained to evaluate the vascular anatomy. Multidetector CT imaging of the abdomen and pelvis was performed using the standard protocol during bolus administration of intravenous contrast. CONTRAST:  75 mL Isovue 370 IV COMPARISON:  None. CT chest, abdomen and pelvis 05/04/2015 and chest CT 12/21/2014. Abdominal/pelvic CT 08/01/2009 FINDINGS: CTA CHEST FINDINGS Lungs are adequately inflated demonstrate interval development of a moderate size right pleural effusion and small left pleural effusion. There is patchy hazy airspace opacification over the left lower lobe which is new. Again noted is a 2.9 cm cavitary lesion over the left apex as the wall is slightly thicker compared to the prior exams although size unchanged. No intracavitary solid component. Airways are within normal. Mild cardiomegaly. Evidence of previous median sternotomy. There is mild prominence of the pulmonary arteries which may reflect a degree of pulmonary arterial hypertension. No evidence of pulmonary emboli. There is mild worsening bilateral hilar and mediastinal adenopathy. The right hilar lymph node measures 1.6 cm, subcarinal lymph node 1.4 cm and prevascular lymph node 1.3 cm by short axis. Calcified plaque over the thoracic aorta. Remaining mediastinal structures are unremarkable. A few small right pericardial phrenic lymph nodes. CT ABDOMEN and PELVIS FINDINGS Abdominal images demonstrate mild gallbladder wall thickening with ill definition of the adjacent fat which could be seen in acute cholecystitis. Mild fluid adjacent the duodenal C-sweep. The liver, spleen, pancreas and adrenal glands are within normal. Kidneys are normal in size with several bilateral  stable cysts and mild bilateral nephrolithiasis. No evidence of hydronephrosis. Ureters are normal. There is calcified plaque over the abdominal aorta.  Stomach is within normal. Small bowel is unremarkable. Appendix is not visualized. Surgical clips are present over the region of the cecum/ascending colon. Pelvic images demonstrate a small amount of free fluid over the midline. The bladder is decompressed. Prostate and rectum are normal. There are degenerative changes throughout the spine. Multilevel disc disease over the lumbar spine. Grade 1 anterolisthesis of L4 on L5. Minimal degenerate change of the hips. 2 cm oval lytic lesion over the left femoral neck unchanged from 2011. Review of the MIP images confirms the above findings. IMPRESSION: Interval development of moderate size right effusion and small left effusion with hazy airspace process over the left lower lobe/ lingular as findings may be due to infection. Worsening bilateral hilar and mediastinal adenopathy as described. Cavity lesion over the left apex unchanged in size although slight interval wall thickening without intracavitary solid component. Cannot exclude an infectious process such as chronic TB or fungal disease. Neoplasm would be less likely. No evidence of pulmonary embolism. Gallbladder wall thickening with suggestion of adjacent inflammatory change as findings may be due to acute cholecystitis. Minimal adjacent free fluid and minimal fluid over the upper pelvis. Recommend clinical correlation and right upper quadrant ultrasound if indicated. Bilateral renal cysts and nephrolithiasis unchanged. Mild cardiomegaly. Electronically Signed   By: Marin Olp M.D.   On: 10/28/2015 22:03   Nm Hepato W/eject Fract  10/31/2015  CLINICAL DATA:  Acalculous cholecystitis. Previous normal nuclear medicine study 10/07/2015 EXAM: NUCLEAR MEDICINE HEPATOBILIARY IMAGING WITH GALLBLADDER EF TECHNIQUE: Sequential images of the abdomen were obtained out to  60 minutes following intravenous administration of radiopharmaceutical. After oral ingestion of Ensure, gallbladder ejection fraction was determined. At 60 min, normal ejection fraction is greater than 33%. RADIOPHARMACEUTICALS:  5.433 mCi Tc-75m  Choletec IV COMPARISON:  Ultrasound 10/28/2015. Nuclear medicine study 10/07/2015. FINDINGS: Prompt uptake and biliary excretion of activity by the liver is seen. Gallbladder activity is visualized, consistent with patency of cystic duct. Biliary activity passes into small bowel, consistent with patent common bile duct. Calculated gallbladder ejection fraction is 42%. (Normal gallbladder ejection fraction with Ensure is greater than 33%.) this is less than was documented 1 month ago, where ejection fraction of 83% occurred. IMPRESSION: Cystic duct patent. Gallbladder ejection fraction 42%. Ejection fraction of 83% seen 1 month ago. Patient may have gallbladder edema related to increased right heart pressures, which could be associated with a decrease in the ejection fraction. Electronically Signed   By: Nelson Chimes M.D.   On: 10/31/2015 14:23   Nm Hepato W/eject Fract  10/07/2015  CLINICAL DATA:  Chronic right upper quadrant abdominal pain. EXAM: NUCLEAR MEDICINE HEPATOBILIARY IMAGING WITH GALLBLADDER EF TECHNIQUE: Sequential images of the abdomen were obtained out to 60 minutes following intravenous administration of radiopharmaceutical. After oral ingestion of Ensure, gallbladder ejection fraction was determined. At 60 min, normal ejection fraction is greater than 33%. RADIOPHARMACEUTICALS:  5.24 mCi Tc-20m  Choletec IV COMPARISON:  Ultrasound of Sep 30, 2015. FINDINGS: Prompt uptake and biliary excretion of activity by the liver is seen. Gallbladder activity is visualized, consistent with patency of cystic duct. Biliary activity passes into small bowel, consistent with patent common bile duct. Calculated gallbladder ejection fraction is 83%. (Normal gallbladder  ejection fraction with Ensure is greater than 33%.) IMPRESSION: Normal gallbladder ejection fraction is noted. Normal and timely filling of gallbladder is noted. Electronically Signed   By: Marijo Conception, M.D.   On: 10/07/2015 15:03   Dg Chest Portable 1 View  10/28/2015  CLINICAL DATA:  Dyspnea. Intermittent chest pain for months. Left-sided chest and abdominal pain. Shortness of breath. EXAM: PORTABLE CHEST 1 VIEW COMPARISON:  Chest CT 05/04/2015. Radiographs and 07/12/2015 and 02/01/2015 reports, images not available for review. FINDINGS: Patient is post median sternotomy. The heart is enlarged. Small pleural effusions and bibasilar opacities, favor atelectasis. Mild vascular congestion without alveolar edema. No pneumothorax left apical bleb on prior CT not well seen radiographically. Remote left rib fracture. IMPRESSION: 1. Cardiomegaly. Small pleural effusions and probable bibasilar atelectasis. 2. Mild vascular congestion, no alveolar edema. 3. Overall findings may reflect fluid overload. Electronically Signed   By: Jeb Levering M.D.   On: 10/28/2015 19:38   US Abdomen Limited Ruq  10/29/2015  CLINICAL DATA:  Upper abdominal pain for years, shortness of breath. Abnormal gallbladder on CT. EXAM: US ABDOMEN LIMITED - RIGHT UPPER QUADRANT COMPARISON:  CT earlier this day. FINDINGS: Gallbladder: Physiologically distended. No gallstones. Diffuse gallbladder wall thickening measuring 5-6 mm. Trace pericholecystic fluid. No sonographic Murphy sign noted by sonographer. Common bile duct: Diameter: 2 mm. Liver: No focal lesion identified. Within normal limits in parenchymal echogenicity. Normal directional flow in the main portal vein. Incidental: Right pleural effusion.  Right renal cysts. IMPRESSION: Diffuse gallbladder wall thickening with trace pericholecystic fluid. No gallstones. Findings may reflect acalculus cholecystitis, acute or chronic versus sequela of liver disease. No biliary dilatation.  Electronically Signed   By: Jeb Levering M.D.   On: 10/29/2015 00:10       Today   Subjective:   Andres Gutierrez  feels better abdominal pain now resolved  Objective:   Blood pressure 114/70, pulse 83, temperature 97.6 F (36.4 C), temperature source Oral, resp. rate 19, height 6\' 4"  (1.93 m), weight 88.905 kg (196 lb), SpO2 92 %.  .  Intake/Output Summary (Last 24 hours) at 11/01/15 1409 Last data filed at 11/01/15 1003  Gross per 24 hour  Intake    410 ml  Output    750 ml  Net   -340 ml    Exam VITAL SIGNS: Blood pressure 114/70, pulse 83, temperature 97.6 F (36.4 C), temperature source Oral, resp. rate 19, height 6\' 4"  (1.93 m), weight 88.905 kg (196 lb), SpO2 92 %.  GENERAL:  82 y.o.-year-old patient lying in the bed with no acute distress.  EYES: Pupils equal, round, reactive to light and accommodation. No scleral icterus. Extraocular muscles intact.  HEENT: Head atraumatic, normocephalic. Oropharynx and nasopharynx clear.  NECK:  Supple, no jugular venous distention. No thyroid enlargement, no tenderness.  LUNGS: Normal breath sounds bilaterally, no wheezing, rales,rhonchi or crepitation. No use of accessory muscles of respiration.  CARDIOVASCULAR: S1, S2 normal. No murmurs, rubs, or gallops.  ABDOMEN: Soft, nontender, nondistended. Bowel sounds present. No organomegaly or mass.  EXTREMITIES: No pedal edema, cyanosis, or clubbing.  NEUROLOGIC: Cranial nerves II through XII are intact. Muscle strength 5/5 in all extremities. Sensation intact. Gait not checked.  PSYCHIATRIC: The patient is alert and oriented x 3.  SKIN: No obvious rash, lesion, or ulcer.   Data Review     CBC w Diff: Lab Results  Component Value Date   WBC 8.8 10/31/2015   WBC 7.4 10/23/2012   HGB 11.8* 10/31/2015   HGB 10.2* 10/23/2012   HCT 35.0* 10/31/2015   HCT 29.9* 10/23/2012   PLT 93* 10/31/2015   PLT 253 10/23/2012   LYMPHOPCT 17% 10/28/2015   LYMPHOPCT 20.7 10/23/2012   MONOPCT  22% 10/28/2015   MONOPCT 24.2 10/23/2012  EOSPCT 0% 10/28/2015   EOSPCT 1.9 10/23/2012   BASOPCT 1% 10/28/2015   BASOPCT 1.1 10/23/2012   CMP: Lab Results  Component Value Date   NA 139 11/01/2015   NA 137 10/23/2012   K 3.6 11/01/2015   K 4.0 10/23/2012   CL 101 11/01/2015   CL 104 10/23/2012   CO2 27 11/01/2015   CO2 28 10/23/2012   BUN 39* 11/01/2015   BUN 13 10/23/2012   CREATININE 1.46* 11/01/2015   CREATININE 1.29 10/23/2012   PROT 7.7 10/28/2015   ALBUMIN 4.9 10/28/2015   BILITOT 1.8* 10/28/2015   ALKPHOS 57 10/28/2015   AST 24 10/28/2015   ALT 23 10/28/2015  .  Micro Results No results found for this or any previous visit (from the past 240 hour(s)).      Code Status Orders        Start     Ordered   10/29/15 0104  Full code   Continuous     10/29/15 0108    Code Status History    Date Active Date Inactive Code Status Order ID Comments User Context   12/22/2014  2:23 AM 12/24/2014  2:13 PM Full Code [id number]  Juluis Mire, MD Inpatient          Follow-up Information    Follow up with CALLWOOD,DWAYNE D., MD In 7 days.   Specialties:  Cardiology, Internal Medicine   Why:  Tuesday, June 27th at 1045am, ccs   Contact information:   [place] [address] [phone]       Follow up with Rusty Aus, MD In 7 days.   Specialty:  Internal Medicine   Why:  Thursday, June 22nd at 1045am, ccs   Contact information:   [place]  [address] [phone]       Discharge Medications     Medication List    TAKE these medications        apixaban 5 MG Tabs tablet  Commonly known as:  ELIQUIS  Take 1 tablet (5 mg total) by mouth 2 (two) times daily.     ascorbic acid 250 MG tablet  Commonly known as:  VITAMIN C  Take 1 tablet (250 mg total) by mouth daily.     aspirin EC 81 MG tablet  Take 81 mg by mouth daily.      atorvastatin 20 MG tablet  Commonly known as:  LIPITOR  Take 20 mg by mouth at bedtime.     dicyclomine 10 MG capsule  Commonly known as:  BENTYL  Take 10 mg by mouth 4 (four) times daily as needed for spasms.     digoxin 0.125 MG tablet  Commonly known as:  LANOXIN  Take 1 tablet (0.125 mg total) by mouth daily.     ferrous sulfate 325 (65 FE) MG tablet  Take 1 tablet (325 mg total) by mouth daily.     furosemide 40 MG tablet  Commonly known as:  LASIX  Take 40 mg by mouth 2 (two) times a week. Pt takes on Monday and Friday.     metoprolol 50 MG tablet  Commonly known as:  LOPRESSOR  Take 1 tablet (50 mg total) by mouth 2 (two) times daily.     nitroGLYCERIN 0.4 MG SL tablet  Commonly known as:  NITROSTAT  Place 0.4 mg under the tongue every 5 (five) minutes as needed for chest pain.  pantoprazole 40 MG tablet  Commonly known as:  PROTONIX  Take 40 mg by mouth 2 (two) times daily before a meal.     potassium chloride 10 MEQ tablet  Commonly known as:  K-DUR  Take 10 mEq by mouth 2 (two) times daily.     ranitidine 300 MG tablet  Commonly known as:  ZANTAC  Take 300 mg by mouth at bedtime.     senna 8.6 MG tablet  Commonly known as:  SENOKOT  Take 1 tablet by mouth at bedtime as needed for constipation.     sucralfate 1 g tablet  Commonly known as:  CARAFATE  Take 1 g by mouth 4 (four) times daily -  with meals and at bedtime.     tamsulosin 0.4 MG Caps capsule  Commonly known as:  FLOMAX  Take 0.4 mg by mouth daily after supper.     VITRON-C 65-125 MG Tabs  Generic drug:  Iron-Vitamin C  Take 1 tablet by mouth daily.           Total Time in preparing paper work, data evaluation and todays exam - 35 minutes  Dustin Flock M.D on 11/01/2015 at 2:09 [facility]  [facility]   Office  [phone]

## 2015-11-01 NOTE — Progress Notes (Signed)
Patients daughter at bedside asking about home health, physical therapy and life alert necklace. RN explained since physical therapy determined patient was at basline patient did not qualify for any of the above to be provided or set up through the hospital. RN encouraged patient and family if they are still interested in these services to ask for more information from family physician on Thursday. Patient and daughter verbalize understanding and thank RN for her explanation.

## 2015-11-01 NOTE — Progress Notes (Signed)
MD walked patient around unit, HR remained stable. Per MD, continue with discharge.

## 2015-11-01 NOTE — Progress Notes (Signed)
A&O. HOH. Independent. IV antibiotics given. Possible discharge today.

## 2015-11-01 NOTE — Discharge Instructions (Addendum)
°  DIET:  Cardiac diet  DISCHARGE CONDITION:  Good  ACTIVITY:  Activity as tolerated  OXYGEN:  Home Oxygen: No.   Oxygen Delivery: room air  DISCHARGE LOCATION:  home    ADDITIONAL DISCHARGE INSTRUCTION:   If you experience worsening of your admission symptoms, develop shortness of breath, life threatening emergency, suicidal or homicidal thoughts you must seek medical attention immediately by calling 911 or calling your MD immediately  if symptoms less severe.  You Must read complete instructions/literature along with all the possible adverse reactions/side effects for all the Medicines you take and that have been prescribed to you. Take any new Medicines after you have completely understood and accpet all the possible adverse reactions/side effects.   Please note  You were cared for by a hospitalist during your hospital stay. If you have any questions about your discharge medications or the care you received while you were in the hospital after you are discharged, you can call the unit and asked to speak with the hospitalist on call if the hospitalist that took care of you is not available. Once you are discharged, your primary care physician will handle any further medical issues. Please note that NO REFILLS for any discharge medications will be authorized once you are discharged, as it is imperative that you return to your primary care physician (or establish a relationship with a primary care physician if you do not have one) for your aftercare needs so that they can reassess your need for medications and monitor your lab values.

## 2015-11-02 ENCOUNTER — Emergency Department: Payer: PPO

## 2015-11-02 ENCOUNTER — Other Ambulatory Visit: Payer: Self-pay

## 2015-11-02 ENCOUNTER — Encounter: Payer: Self-pay | Admitting: *Deleted

## 2015-11-02 ENCOUNTER — Emergency Department
Admission: EM | Admit: 2015-11-02 | Discharge: 2015-11-02 | Disposition: A | Discharge status: Home / Self Care | Payer: PPO | Source: Home / Self Care | Attending: Emergency Medicine | Admitting: Emergency Medicine

## 2015-11-02 DIAGNOSIS — I10 Essential (primary) hypertension: Secondary | ICD-10-CM | POA: Diagnosis not present

## 2015-11-02 DIAGNOSIS — Z7982 Long term (current) use of aspirin: Secondary | ICD-10-CM

## 2015-11-02 DIAGNOSIS — I5022 Chronic systolic (congestive) heart failure: Secondary | ICD-10-CM | POA: Diagnosis not present

## 2015-11-02 DIAGNOSIS — S72092A Other fracture of head and neck of left femur, initial encounter for closed fracture: Secondary | ICD-10-CM | POA: Diagnosis not present

## 2015-11-02 DIAGNOSIS — S72002A Fracture of unspecified part of neck of left femur, initial encounter for closed fracture: Secondary | ICD-10-CM | POA: Diagnosis not present

## 2015-11-02 DIAGNOSIS — I482 Chronic atrial fibrillation: Secondary | ICD-10-CM

## 2015-11-02 DIAGNOSIS — N189 Chronic kidney disease, unspecified: Secondary | ICD-10-CM

## 2015-11-02 DIAGNOSIS — M25452 Effusion, left hip: Secondary | ICD-10-CM | POA: Diagnosis not present

## 2015-11-02 DIAGNOSIS — K219 Gastro-esophageal reflux disease without esophagitis: Secondary | ICD-10-CM | POA: Diagnosis not present

## 2015-11-02 DIAGNOSIS — Z7901 Long term (current) use of anticoagulants: Secondary | ICD-10-CM | POA: Diagnosis not present

## 2015-11-02 DIAGNOSIS — N183 Chronic kidney disease, stage 3 (moderate): Secondary | ICD-10-CM | POA: Diagnosis not present

## 2015-11-02 DIAGNOSIS — I4892 Unspecified atrial flutter: Secondary | ICD-10-CM | POA: Insufficient documentation

## 2015-11-02 DIAGNOSIS — I129 Hypertensive chronic kidney disease with stage 1 through stage 4 chronic kidney disease, or unspecified chronic kidney disease: Secondary | ICD-10-CM | POA: Insufficient documentation

## 2015-11-02 DIAGNOSIS — I429 Cardiomyopathy, unspecified: Secondary | ICD-10-CM | POA: Insufficient documentation

## 2015-11-02 DIAGNOSIS — E785 Hyperlipidemia, unspecified: Secondary | ICD-10-CM | POA: Diagnosis not present

## 2015-11-02 DIAGNOSIS — Z800 Family history of malignant neoplasm of digestive organs: Secondary | ICD-10-CM | POA: Diagnosis not present

## 2015-11-02 DIAGNOSIS — Z955 Presence of coronary angioplasty implant and graft: Secondary | ICD-10-CM | POA: Insufficient documentation

## 2015-11-02 DIAGNOSIS — M25551 Pain in right hip: Secondary | ICD-10-CM | POA: Diagnosis not present

## 2015-11-02 DIAGNOSIS — Z79899 Other long term (current) drug therapy: Secondary | ICD-10-CM | POA: Insufficient documentation

## 2015-11-02 DIAGNOSIS — I255 Ischemic cardiomyopathy: Secondary | ICD-10-CM | POA: Diagnosis not present

## 2015-11-02 DIAGNOSIS — I130 Hypertensive heart and chronic kidney disease with heart failure and stage 1 through stage 4 chronic kidney disease, or unspecified chronic kidney disease: Secondary | ICD-10-CM | POA: Diagnosis not present

## 2015-11-02 DIAGNOSIS — F17220 Nicotine dependence, chewing tobacco, uncomplicated: Secondary | ICD-10-CM

## 2015-11-02 DIAGNOSIS — Z8249 Family history of ischemic heart disease and other diseases of the circulatory system: Secondary | ICD-10-CM | POA: Diagnosis not present

## 2015-11-02 DIAGNOSIS — I119 Hypertensive heart disease without heart failure: Secondary | ICD-10-CM

## 2015-11-02 DIAGNOSIS — S72009A Fracture of unspecified part of neck of unspecified femur, initial encounter for closed fracture: Secondary | ICD-10-CM | POA: Diagnosis not present

## 2015-11-02 DIAGNOSIS — I2510 Atherosclerotic heart disease of native coronary artery without angina pectoris: Secondary | ICD-10-CM

## 2015-11-02 DIAGNOSIS — I252 Old myocardial infarction: Secondary | ICD-10-CM

## 2015-11-02 DIAGNOSIS — Z9109 Other allergy status, other than to drugs and biological substances: Secondary | ICD-10-CM | POA: Diagnosis not present

## 2015-11-02 DIAGNOSIS — Z9889 Other specified postprocedural states: Secondary | ICD-10-CM | POA: Diagnosis not present

## 2015-11-02 DIAGNOSIS — M84552A Pathological fracture in neoplastic disease, left femur, initial encounter for fracture: Secondary | ICD-10-CM | POA: Diagnosis not present

## 2015-11-02 DIAGNOSIS — R9431 Abnormal electrocardiogram [ECG] [EKG]: Secondary | ICD-10-CM | POA: Diagnosis not present

## 2015-11-02 DIAGNOSIS — Z01810 Encounter for preprocedural cardiovascular examination: Secondary | ICD-10-CM | POA: Diagnosis not present

## 2015-11-02 DIAGNOSIS — M25552 Pain in left hip: Secondary | ICD-10-CM | POA: Insufficient documentation

## 2015-11-02 DIAGNOSIS — M5116 Intervertebral disc disorders with radiculopathy, lumbar region: Secondary | ICD-10-CM | POA: Diagnosis not present

## 2015-11-02 DIAGNOSIS — D696 Thrombocytopenia, unspecified: Secondary | ICD-10-CM | POA: Diagnosis not present

## 2015-11-02 DIAGNOSIS — N400 Enlarged prostate without lower urinary tract symptoms: Secondary | ICD-10-CM | POA: Diagnosis not present

## 2015-11-02 DIAGNOSIS — Z87442 Personal history of urinary calculi: Secondary | ICD-10-CM | POA: Diagnosis not present

## 2015-11-02 DIAGNOSIS — M25559 Pain in unspecified hip: Secondary | ICD-10-CM | POA: Diagnosis not present

## 2015-11-02 DIAGNOSIS — K819 Cholecystitis, unspecified: Secondary | ICD-10-CM | POA: Diagnosis not present

## 2015-11-02 DIAGNOSIS — I25810 Atherosclerosis of coronary artery bypass graft(s) without angina pectoris: Secondary | ICD-10-CM | POA: Diagnosis not present

## 2015-11-02 DIAGNOSIS — I4891 Unspecified atrial fibrillation: Secondary | ICD-10-CM | POA: Diagnosis not present

## 2015-11-02 DIAGNOSIS — Z951 Presence of aortocoronary bypass graft: Secondary | ICD-10-CM | POA: Diagnosis not present

## 2015-11-02 MED ORDER — TRAMADOL HCL 50 MG PO TABS: 50.0000 mg | ORAL_TABLET | Freq: Four times a day (QID) | ORAL | Status: DC | PRN

## 2015-11-02 MED ORDER — TRAMADOL HCL 50 MG PO TABS
50.0000 mg | ORAL_TABLET | Freq: Once | ORAL | Status: AC
  Administered 2015-11-02: 50 mg via ORAL

## 2015-11-02 MED ORDER — TRAMADOL HCL 50 MG PO TABS
ORAL_TABLET | ORAL | Status: AC
  Administered 2015-11-02: 50 mg via ORAL
  Filled 2015-11-02: qty 1

## 2015-11-02 NOTE — ED Notes (Signed)
Per EMS report, patient c/o left hip pain that woke him up at 0300 last night. Patient states he took Aleve this morning and denies relief of pain, but could walk with his cane in the house. Patient took a 2nd Aleve around 71 with no relief. Patient states he sat on his couch for 4 hours straight this afternoon and could barely stand. Patient's son called EMS. Patient did walk into the bedroom with his cane prior to the EMS arriving to look for bruises, but didn't find any.  Patient was recently discharged from this hospital yesterday and had left hip pain prior to discharge. Patient states that he did sit on the porch for an hour yesterday when he got home from the hospital.

## 2015-11-02 NOTE — Discharge Instructions (Signed)
Please seek medical attention for any high fevers, chest pain, shortness of breath, change in behavior, persistent vomiting, bloody stool or any other new or concerning symptoms.   Hip Pain Your hip is the joint between your upper legs and your lower pelvis. The bones, cartilage, tendons, and muscles of your hip joint perform a lot of work each day supporting your body weight and allowing you to move around. Hip pain can range from a minor ache to severe pain in one or both of your hips. Pain may be felt on the inside of the hip joint near the groin, or the outside near the buttocks and upper thigh. You may have swelling or stiffness as well.  HOME CARE INSTRUCTIONS   Take medicines only as directed by your health care provider.  Apply ice to the injured area:  Put ice in a plastic bag.  Place a towel between your skin and the bag.  Leave the ice on for 15-20 minutes at a time, 3-4 times a day.  Keep your leg raised (elevated) when possible to lessen swelling.  Avoid activities that cause pain.  Follow specific exercises as directed by your health care provider.  Sleep with a pillow between your legs on your most comfortable side.  Record how often you have hip pain, the location of the pain, and what it feels like. SEEK MEDICAL CARE IF:   You are unable to put weight on your leg.  Your hip is red or swollen or very tender to touch.  Your pain or swelling continues or worsens after 1 week.  You have increasing difficulty walking.  You have a fever. SEEK IMMEDIATE MEDICAL CARE IF:   You have fallen.  You have a sudden increase in pain and swelling in your hip. MAKE SURE YOU:   Understand these instructions.  Will watch your condition.  Will get help right away if you are not doing well or get worse.   This information is not intended to replace advice given to you by your health care provider. Make sure you discuss any questions you have with your health care  provider.   Document Released: 10/18/2009 Document Revised: 05/21/2014 Document Reviewed: 12/25/2012 Elsevier Interactive Patient Education [employer].

## 2015-11-02 NOTE — ED Provider Notes (Signed)
[facility] Emergency Department Provider Note  ____________________________________________  Time seen: ~2030 I have reviewed the triage vital signs and the nursing notes.   HISTORY  Chief Complaint Hip Pain   History limited by: Not Limited   HPI Andres Gutierrez is a 82 y.o. male who presents to the emergency department today because of concerns for left pain. The pain started yesterday. The patient was discharged from the hospital yesterday after admission because of concerns for A. fib with RVR. At the time of discharge patient had a little bit of left hip discomfort. This is continued since discharge. It is worse when he gets up and walks around. He denies any trauma or falls. Denies similar pain in the past. The change in sensation of his leg. No fevers. He has not noticed any redness or rash over the hip.   Past Medical History  Diagnosis Date  . Hypertension   . MI (myocardial infarction) ([place])     x 2  . Coronary artery disease   . Dysrhythmia   . Cardiomyopathy ([place])   . Lymphadenopathy, hilar   . Wheezing   . Cough   . Anemia   . Anxiety   . Chronic kidney disease     kidney stones    Patient Active Problem List   Diagnosis Date Noted  . Atypical pneumonia   . Chronic atrial fibrillation ([place])   . Coronary artery disease involving native coronary artery of native heart without angina pectoris   . Acalculous cholecystitis 10/29/2015  . Adenopathy   . Chest pain 12/22/2014  . New onset atrial flutter ([place]) 12/22/2014  . Abnormal CT scan, chest 12/22/2014  . CAD (coronary artery disease) 12/22/2014  . HTN (hypertension) 12/22/2014    Past Surgical History  Procedure Laterality Date  . Coronary artery bypass graft    . Coronary angioplasty with stent placement    . Endobronchial ultrasound N/A 12/31/2014    Procedure: ENDOBRONCHIAL ULTRASOUND;  Surgeon: Flora Lipps, MD;  Location: [facility] ORS;  Service: Cardiopulmonary;  Laterality: N/A;   . Bronchial needle aspiration biopsy N/A 12/31/2014    Procedure: BRONCHIAL NEEDLE ASPIRATION BIOPSIES from carina;  Surgeon: Flora Lipps, MD;  Location: [facility] ORS;  Service: Cardiopulmonary;  Laterality: N/A;  . Esophagogastroduodenoscopy (egd) with propofol N/A 06/20/2015    Procedure: ESOPHAGOGASTRODUODENOSCOPY (EGD) WITH PROPOFOL;  Surgeon: Lollie Sails, MD;  Location: [facility] ENDOSCOPY;  Service: Endoscopy;  Laterality: N/A;    Current Outpatient Rx  Name  Route  Sig  Dispense  Refill  . apixaban (ELIQUIS) 5 MG TABS tablet   Oral   Take 1 tablet (5 mg total) by mouth 2 (two) times daily.   60 tablet   11   . aspirin EC 81 MG tablet   Oral   Take 81 mg by mouth daily.         Marland Kitchen atorvastatin (LIPITOR) 20 MG tablet   Oral   Take 20 mg by mouth at bedtime.          . dicyclomine (BENTYL) 10 MG capsule   Oral   Take 10 mg by mouth 4 (four) times daily as needed for spasms.         . digoxin (LANOXIN) 0.125 MG tablet   Oral   Take 1 tablet (0.125 mg total) by mouth daily.   30 tablet   0   . ferrous sulfate 325 (65 FE) MG tablet   Oral   Take 1 tablet (325 mg  total) by mouth daily.   30 tablet   3   . furosemide (LASIX) 40 MG tablet   Oral   Take 40 mg by mouth 2 (two) times a week. Pt takes on Monday and Friday.         . Iron-Vitamin C (VITRON-C) 65-125 MG TABS   Oral   Take 1 tablet by mouth daily.         . metoprolol (LOPRESSOR) 50 MG tablet   Oral   Take 1 tablet (50 mg total) by mouth 2 (two) times daily.   60 tablet   0   . nitroGLYCERIN (NITROSTAT) 0.4 MG SL tablet   Sublingual   Place 0.4 mg under the tongue every 5 (five) minutes as needed for chest pain.         . pantoprazole (PROTONIX) 40 MG tablet   Oral   Take 40 mg by mouth 2 (two) times daily before a meal.          . potassium chloride (K-DUR) 10 MEQ tablet   Oral   Take 10 mEq by mouth 2 (two) times daily.         . ranitidine (ZANTAC) 300 MG tablet   Oral   Take 300  mg by mouth at bedtime.         . senna (SENOKOT) 8.6 MG tablet   Oral   Take 1 tablet by mouth at bedtime as needed for constipation.         . sucralfate (CARAFATE) 1 G tablet   Oral   Take 1 g by mouth 4 (four) times daily -  with meals and at bedtime.          . tamsulosin (FLOMAX) 0.4 MG CAPS capsule   Oral   Take 0.4 mg by mouth daily after supper.          . vitamin C (VITAMIN C) 250 MG tablet   Oral   Take 1 tablet (250 mg total) by mouth daily.   30 tablet   0     Allergies Diphenhydramine hcl; Escitalopram; Maxidex; Prednisone; Serotonin; Vytorin; and Zocor  Family History  Problem Relation Age of Onset  . CAD Mother   . Colon cancer Father     Social History Social History  Substance Use Topics  . Smoking status: Former Smoker    Quit date: 06/27/1977  . Smokeless tobacco: Current User    Types: Chew  . Alcohol Use: Yes     Comment: occational almost rare once a year    Review of Systems  Constitutional: Negative for fever. Cardiovascular: Negative for chest pain. Respiratory: Negative for shortness of breath. Gastrointestinal: Negative for abdominal pain, vomiting and diarrhea. Musculoskeletal: Positive for left hip pain Neurological: Negative for headaches, focal weakness or numbness.   10-point ROS otherwise negative.  ____________________________________________   PHYSICAL EXAM:  VITAL SIGNS: ED Triage Vitals  Enc Vitals Group     BP 11/02/15 2017 103/67 mmHg     Pulse Rate 11/02/15 2017 75     Resp 11/02/15 2017 18     Temp 11/02/15 2017 98.2 F (36.8 C)     Temp Source 11/02/15 2017 Oral     SpO2 11/02/15 2017 96 %     Weight 11/02/15 2017 208 lb 4.8 oz (94.484 kg)     Height 11/02/15 2017 6\' 4"  (1.93 m)   Constitutional: Alert and oriented. Well appearing and in no distress. Eyes: Conjunctivae are normal. PERRL. Normal  extraocular movements. ENT   Head: Normocephalic and atraumatic.   Nose: No  congestion/rhinnorhea.   Mouth/Throat: Mucous membranes are moist.   Neck: No stridor. Hematological/Lymphatic/Immunilogical: No cervical lymphadenopathy. Cardiovascular: Normal rate, regular rhythm.  No murmurs, rubs, or gallops. Respiratory: Normal respiratory effort without tachypnea nor retractions. Breath sounds are clear and equal bilaterally. No wheezes/rales/rhonchi. Gastrointestinal: Soft and nontender. No distention. There is no CVA tenderness. Genitourinary: Deferred Musculoskeletal: Normal range of motion in all extremities. No joint effusions.  No lower extremity tenderness nor edema. Neurologic:  Normal speech and language. No gross focal neurologic deficits are appreciated.  Skin:  Skin is warm, dry and intact. No rash noted. Psychiatric: Mood and affect are normal. Speech and behavior are normal. Patient exhibits appropriate insight and judgment.  ____________________________________________    LABS (pertinent positives/negatives)  None  ____________________________________________   EKG  I, Nance Pear, attending physician, personally viewed and interpreted this EKG  EKG Time: 2229 Rate: 76 Rhythm: normal sinus rhythm Axis: right axis deviation Intervals: qtc 443 QRS: nonspecific intraventricular conduction delay ST changes: no st elevation Impression: abnormal ekg   ____________________________________________    RADIOLOGY  Left hip IMPRESSION: No evidence of fracture or dislocation.   ____________________________________________   PROCEDURES  Procedure(s) performed: None  Critical Care performed: No  ____________________________________________   INITIAL IMPRESSION / ASSESSMENT AND PLAN / ED COURSE  Pertinent labs & imaging results that were available during my care of the patient were reviewed by me and considered in my medical decision making (see chart for details).  She presented to the emergency department today because  of concerns for left hip pain. No trauma or fall. On exam patient without any deformity. No erythema warmth or redness over the area of the pain for joint. X-rays were performed which did not show any acute fracture. At this point I think possible the pain is related to the patient's recent hospitalization and disuse. Do not think it represents a septic joint. Do not think it represents occult fracture. Will discharge home with pain medication. Patient does have follow-up appointment tomorrow.  At the family's request the patient was put on the cardiac monitor given recent hospitalization for abnormal heart rate. It does appear that his heart rate was within normal limits with frequent PVCs.  ____________________________________________   FINAL CLINICAL IMPRESSION(S) / ED DIAGNOSES  Final diagnoses:  Hip pain, left     Note: This dictation was prepared with Dragon dictation. Any transcriptional errors that result from this process are unintentional    Nance Pear, MD 11/02/15 2324

## 2015-11-03 ENCOUNTER — Inpatient Hospital Stay
Admission: EM | Admit: 2015-11-03 | Discharge: 2015-11-05 | DRG: 556 | Disposition: A | Discharge status: Home / Self Care | Payer: PPO | Attending: Specialist | Admitting: Specialist

## 2015-11-03 ENCOUNTER — Other Ambulatory Visit: Payer: Self-pay

## 2015-11-03 ENCOUNTER — Emergency Department: Payer: PPO

## 2015-11-03 ENCOUNTER — Encounter: Payer: Self-pay | Admitting: Emergency Medicine

## 2015-11-03 DIAGNOSIS — Z951 Presence of aortocoronary bypass graft: Secondary | ICD-10-CM | POA: Diagnosis not present

## 2015-11-03 DIAGNOSIS — Z9889 Other specified postprocedural states: Secondary | ICD-10-CM

## 2015-11-03 DIAGNOSIS — E785 Hyperlipidemia, unspecified: Secondary | ICD-10-CM | POA: Diagnosis not present

## 2015-11-03 DIAGNOSIS — M5116 Intervertebral disc disorders with radiculopathy, lumbar region: Secondary | ICD-10-CM | POA: Diagnosis not present

## 2015-11-03 DIAGNOSIS — S72092A Other fracture of head and neck of left femur, initial encounter for closed fracture: Secondary | ICD-10-CM | POA: Diagnosis not present

## 2015-11-03 DIAGNOSIS — Z955 Presence of coronary angioplasty implant and graft: Secondary | ICD-10-CM

## 2015-11-03 DIAGNOSIS — M25452 Effusion, left hip: Secondary | ICD-10-CM | POA: Diagnosis not present

## 2015-11-03 DIAGNOSIS — N400 Enlarged prostate without lower urinary tract symptoms: Secondary | ICD-10-CM | POA: Diagnosis present

## 2015-11-03 DIAGNOSIS — I5022 Chronic systolic (congestive) heart failure: Secondary | ICD-10-CM | POA: Diagnosis not present

## 2015-11-03 DIAGNOSIS — Z800 Family history of malignant neoplasm of digestive organs: Secondary | ICD-10-CM

## 2015-11-03 DIAGNOSIS — Z79899 Other long term (current) drug therapy: Secondary | ICD-10-CM

## 2015-11-03 DIAGNOSIS — M25551 Pain in right hip: Secondary | ICD-10-CM | POA: Diagnosis not present

## 2015-11-03 DIAGNOSIS — Z7982 Long term (current) use of aspirin: Secondary | ICD-10-CM | POA: Diagnosis not present

## 2015-11-03 DIAGNOSIS — I255 Ischemic cardiomyopathy: Secondary | ICD-10-CM | POA: Diagnosis not present

## 2015-11-03 DIAGNOSIS — I130 Hypertensive heart and chronic kidney disease with heart failure and stage 1 through stage 4 chronic kidney disease, or unspecified chronic kidney disease: Secondary | ICD-10-CM | POA: Diagnosis present

## 2015-11-03 DIAGNOSIS — K219 Gastro-esophageal reflux disease without esophagitis: Secondary | ICD-10-CM | POA: Diagnosis present

## 2015-11-03 DIAGNOSIS — D696 Thrombocytopenia, unspecified: Secondary | ICD-10-CM | POA: Diagnosis not present

## 2015-11-03 DIAGNOSIS — Z8249 Family history of ischemic heart disease and other diseases of the circulatory system: Secondary | ICD-10-CM

## 2015-11-03 DIAGNOSIS — R9431 Abnormal electrocardiogram [ECG] [EKG]: Secondary | ICD-10-CM | POA: Diagnosis not present

## 2015-11-03 DIAGNOSIS — S72002A Fracture of unspecified part of neck of left femur, initial encounter for closed fracture: Secondary | ICD-10-CM | POA: Diagnosis not present

## 2015-11-03 DIAGNOSIS — N183 Chronic kidney disease, stage 3 (moderate): Secondary | ICD-10-CM | POA: Diagnosis not present

## 2015-11-03 DIAGNOSIS — I482 Chronic atrial fibrillation: Secondary | ICD-10-CM | POA: Diagnosis present

## 2015-11-03 DIAGNOSIS — Z9109 Other allergy status, other than to drugs and biological substances: Secondary | ICD-10-CM

## 2015-11-03 DIAGNOSIS — Z01810 Encounter for preprocedural cardiovascular examination: Secondary | ICD-10-CM | POA: Diagnosis not present

## 2015-11-03 DIAGNOSIS — Z7901 Long term (current) use of anticoagulants: Secondary | ICD-10-CM | POA: Diagnosis not present

## 2015-11-03 DIAGNOSIS — I2510 Atherosclerotic heart disease of native coronary artery without angina pectoris: Secondary | ICD-10-CM | POA: Diagnosis present

## 2015-11-03 DIAGNOSIS — F17220 Nicotine dependence, chewing tobacco, uncomplicated: Secondary | ICD-10-CM | POA: Diagnosis present

## 2015-11-03 DIAGNOSIS — I25810 Atherosclerosis of coronary artery bypass graft(s) without angina pectoris: Secondary | ICD-10-CM | POA: Diagnosis not present

## 2015-11-03 DIAGNOSIS — M25552 Pain in left hip: Secondary | ICD-10-CM | POA: Diagnosis not present

## 2015-11-03 DIAGNOSIS — Z87442 Personal history of urinary calculi: Secondary | ICD-10-CM | POA: Diagnosis not present

## 2015-11-03 DIAGNOSIS — I252 Old myocardial infarction: Secondary | ICD-10-CM | POA: Diagnosis not present

## 2015-11-03 DIAGNOSIS — I10 Essential (primary) hypertension: Secondary | ICD-10-CM | POA: Diagnosis not present

## 2015-11-03 DIAGNOSIS — I4891 Unspecified atrial fibrillation: Secondary | ICD-10-CM | POA: Diagnosis not present

## 2015-11-03 DIAGNOSIS — M84552A Pathological fracture in neoplastic disease, left femur, initial encounter for fracture: Secondary | ICD-10-CM | POA: Diagnosis not present

## 2015-11-03 DIAGNOSIS — M25559 Pain in unspecified hip: Secondary | ICD-10-CM | POA: Diagnosis not present

## 2015-11-03 DIAGNOSIS — K819 Cholecystitis, unspecified: Secondary | ICD-10-CM | POA: Diagnosis not present

## 2015-11-03 LAB — BASIC METABOLIC PANEL WITH GFR
Anion gap: 8 (ref 5–15)
BUN: 35 mg/dL — ABNORMAL HIGH (ref 6–20)
CO2: 26 mmol/L (ref 22–32)
Calcium: 9.2 mg/dL (ref 8.9–10.3)
Chloride: 101 mmol/L (ref 101–111)
Creatinine, Ser: 1.38 mg/dL — ABNORMAL HIGH (ref 0.61–1.24)
GFR calc Af Amer: 53 mL/min — ABNORMAL LOW (ref >60)
GFR calc non Af Amer: 46 mL/min — ABNORMAL LOW (ref >60)
Glucose, Bld: 105 mg/dL — ABNORMAL HIGH (ref 65–99)
Potassium: 4 mmol/L (ref 3.5–5.1)
Sodium: 135 mmol/L (ref 135–145)

## 2015-11-03 LAB — CBC
HCT: 36.7 % — ABNORMAL LOW (ref 40.0–52.0)
Hemoglobin: 12.1 g/dL — ABNORMAL LOW (ref 13.0–18.0)
MCH: 34.2 pg — ABNORMAL HIGH (ref 26.0–34.0)
MCHC: 33 g/dL (ref 32.0–36.0)
MCV: 103.6 fL — ABNORMAL HIGH (ref 80.0–100.0)
Platelets: 101 K/uL — ABNORMAL LOW (ref 150–440)
RBC: 3.54 MIL/uL — ABNORMAL LOW (ref 4.40–5.90)
RDW: 18.4 % — ABNORMAL HIGH (ref 11.5–14.5)
WBC: 6.2 K/uL (ref 3.8–10.6)

## 2015-11-03 MED ORDER — ATORVASTATIN CALCIUM 20 MG PO TABS
20.0000 mg | ORAL_TABLET | Freq: Every day | ORAL | Status: DC
  Administered 2015-11-03 – 2015-11-04 (×2): 20 mg via ORAL
  Filled 2015-11-03 (×2): qty 1

## 2015-11-03 MED ORDER — VITAMIN C 500 MG PO TABS
250.0000 mg | ORAL_TABLET | Freq: Every day | ORAL | Status: DC
Start: 2015-11-03 — End: 2015-11-05
  Administered 2015-11-03 – 2015-11-05 (×3): 250 mg via ORAL
  Filled 2015-11-03 (×3): qty 1

## 2015-11-03 MED ORDER — POTASSIUM CHLORIDE CRYS ER 10 MEQ PO TBCR
10.0000 meq | EXTENDED_RELEASE_TABLET | Freq: Two times a day (BID) | ORAL | Status: DC
Start: 2015-11-03 — End: 2015-11-05
  Administered 2015-11-03 – 2015-11-05 (×4): 10 meq via ORAL
  Filled 2015-11-03 (×6): qty 1

## 2015-11-03 MED ORDER — ACETAMINOPHEN 650 MG RE SUPP: 650.0000 mg | Freq: Four times a day (QID) | RECTAL | Status: DC | PRN

## 2015-11-03 MED ORDER — POLYETHYLENE GLYCOL 3350 17 G PO PACK: 17.0000 g | PACK | Freq: Every day | ORAL | Status: DC | PRN

## 2015-11-03 MED ORDER — ONDANSETRON HCL 4 MG PO TABS: 4.0000 mg | ORAL_TABLET | Freq: Four times a day (QID) | ORAL | Status: DC | PRN

## 2015-11-03 MED ORDER — MORPHINE SULFATE (PF) 2 MG/ML IV SOLN: 2.0000 mg | INTRAVENOUS | Status: DC | PRN

## 2015-11-03 MED ORDER — SENNOSIDES 8.6 MG PO TABS
1.0000 | ORAL_TABLET | Freq: Every evening | ORAL | Status: DC | PRN
  Filled 2015-11-03: qty 1

## 2015-11-03 MED ORDER — NITROGLYCERIN 0.4 MG SL SUBL: 0.4000 mg | SUBLINGUAL_TABLET | SUBLINGUAL | Status: DC | PRN

## 2015-11-03 MED ORDER — DIGOXIN 125 MCG PO TABS
0.1250 mg | ORAL_TABLET | Freq: Every day | ORAL | Status: DC
  Administered 2015-11-03 – 2015-11-05 (×3): 0.125 mg via ORAL
  Filled 2015-11-03 (×4): qty 1

## 2015-11-03 MED ORDER — FERROUS SULFATE 325 (65 FE) MG PO TABS
325.0000 mg | ORAL_TABLET | Freq: Every day | ORAL | Status: DC
  Administered 2015-11-04 – 2015-11-05 (×2): 325 mg via ORAL
  Filled 2015-11-03 (×2): qty 1

## 2015-11-03 MED ORDER — DOCUSATE SODIUM 100 MG PO CAPS
100.0000 mg | ORAL_CAPSULE | Freq: Two times a day (BID) | ORAL | Status: DC
  Administered 2015-11-03 – 2015-11-05 (×4): 100 mg via ORAL
  Filled 2015-11-03 (×4): qty 1

## 2015-11-03 MED ORDER — TAMSULOSIN HCL 0.4 MG PO CAPS
0.4000 mg | ORAL_CAPSULE | Freq: Every day | ORAL | Status: DC
  Administered 2015-11-03 – 2015-11-04 (×2): 0.4 mg via ORAL
  Filled 2015-11-03 (×2): qty 1

## 2015-11-03 MED ORDER — PANTOPRAZOLE SODIUM 40 MG PO TBEC
40.0000 mg | DELAYED_RELEASE_TABLET | Freq: Two times a day (BID) | ORAL | Status: DC
Start: 2015-11-04 — End: 2015-11-05
  Administered 2015-11-04 – 2015-11-05 (×3): 40 mg via ORAL
  Filled 2015-11-03 (×3): qty 1

## 2015-11-03 MED ORDER — METOPROLOL TARTRATE 50 MG PO TABS
50.0000 mg | ORAL_TABLET | Freq: Two times a day (BID) | ORAL | Status: DC
  Administered 2015-11-03 – 2015-11-05 (×3): 50 mg via ORAL
  Filled 2015-11-03 (×4): qty 1

## 2015-11-03 MED ORDER — SUCRALFATE 1 G PO TABS
1.0000 g | ORAL_TABLET | Freq: Three times a day (TID) | ORAL | Status: DC
Start: 2015-11-03 — End: 2015-11-05
  Administered 2015-11-03 – 2015-11-05 (×7): 1 g via ORAL
  Filled 2015-11-03 (×7): qty 1

## 2015-11-03 MED ORDER — OXYCODONE HCL 5 MG PO TABS: 5.0000 mg | ORAL_TABLET | ORAL | Status: DC | PRN

## 2015-11-03 MED ORDER — ONDANSETRON HCL 4 MG/2ML IJ SOLN: 4.0000 mg | Freq: Four times a day (QID) | INTRAMUSCULAR | Status: DC | PRN

## 2015-11-03 MED ORDER — ACETAMINOPHEN 325 MG PO TABS
650.0000 mg | ORAL_TABLET | Freq: Four times a day (QID) | ORAL | Status: DC | PRN
  Filled 2015-11-03: qty 2

## 2015-11-03 MED ORDER — FUROSEMIDE 40 MG PO TABS
40.0000 mg | ORAL_TABLET | ORAL | Status: DC
  Administered 2015-11-03: 40 mg via ORAL
  Filled 2015-11-03: qty 1

## 2015-11-03 NOTE — Consult Note (Signed)
ORTHOPAEDIC CONSULTATION  REQUESTING PHYSICIAN: Lytle Butte, MD  Chief Complaint:   Left hip pain.  History of Present Illness: Andres Gutierrez is a 82 y.o. male with multiple medical problems, including hypertension, coronary artery disease, cardiomyopathy, status post MI 2 and CABG procedure, and chronic anemia, who lives independently. The patient had been admitted last week for several days for the treatment of abdominal pain which resolved with IV antibiotics to treat a presumed acute cholecystitis. Several days ago, he developed increased pain in his left hip. He presented emergency room yesterday where x-rays apparently were unremarkable for any fractures or significant degenerative changes area and he saw his primary care provider, Dr. Emily Filbert, this morning, who performed a trochanteric injection into the left hip in the hopes of alleviating his symptoms. However, he continued to experience worsening left hip pain, special with weightbearing, prompting him to return to the emergency room for further evaluation. A CT scan of the left hip was performed which demonstrated findings consistent with a possible stress fracture/occult fracture of the left intertrochanteric/basilar neck region. The CT scan also demonstrated a 1.5 x 2 cm lytic process in the intertrochanteric region of uncertain etiology. The patient is being admitted for further workup and possible surgical stabilization of the left hip.  Past Medical History  Diagnosis Date  . Hypertension   . MI (myocardial infarction) ([place])     x 2  . Coronary artery disease   . Dysrhythmia   . Cardiomyopathy ([place])   . Lymphadenopathy, hilar   . Wheezing   . Cough   . Anemia   . Anxiety   . Chronic kidney disease     kidney stones   Past Surgical History  Procedure Laterality Date  . Coronary artery bypass graft    . Coronary angioplasty with stent placement    .  Endobronchial ultrasound N/A 12/31/2014    Procedure: ENDOBRONCHIAL ULTRASOUND;  Surgeon: Flora Lipps, MD;  Location: [facility] ORS;  Service: Cardiopulmonary;  Laterality: N/A;  . Bronchial needle aspiration biopsy N/A 12/31/2014    Procedure: BRONCHIAL NEEDLE ASPIRATION BIOPSIES from carina;  Surgeon: Flora Lipps, MD;  Location: [facility] ORS;  Service: Cardiopulmonary;  Laterality: N/A;  . Esophagogastroduodenoscopy (egd) with propofol N/A 06/20/2015    Procedure: ESOPHAGOGASTRODUODENOSCOPY (EGD) WITH PROPOFOL;  Surgeon: Lollie Sails, MD;  Location[facility] ENDOSCOPY;  Service: Endoscopy;  Laterality: N/A;   Social History   Social History  . Marital Status: Widowed    Spouse Name: N/A  . Number of Children: N/A  . Years of Education: N/A   Social History Main Topics  . Smoking status: Former Smoker    Quit date: 06/27/1977  . Smokeless tobacco: Current User    Types: Chew  . Alcohol Use: Yes     Comment: occational almost rare once a year  . Drug Use: No  . Sexual Activity: Not Asked   Other Topics Concern  . None   Social History Narrative   Family History  Problem Relation Age of Onset  . CAD Mother   . Colon cancer Father    Allergies  Allergen Reactions  . Diphenhydramine Hcl Other (See Comments)    Reaction:  Rash and fever a long time ago, but has had it since with no problem.  . Escitalopram Other (See Comments)    Reaction:  Makes him feel faint, like he was going to have a heart attack.  . Maxidex [Dexamethasone] Other (See Comments)    Reaction:  Unknown   .  Prednisone Other (See Comments)    Pt states that this medication made him feel crazy.    . Serotonin Other (See Comments)    Tried 2 different types, lexapro and another one.  Reaction:  Made him feel like he was having a heart attack.   . Vytorin [Ezetimibe-Simvastatin] Other (See Comments)    Reaction:  Unknown   . Zocor [Simvastatin] Other (See Comments)    Reaction:  Unknown    Prior to Admission  medications   Medication Sig Start Date End Date Taking? Authorizing Provider  apixaban (ELIQUIS) 5 MG TABS tablet Take 1 tablet (5 mg total) by mouth 2 (two) times daily. 12/23/14  Yes Rusty Aus, MD  aspirin EC 81 MG tablet Take 81 mg by mouth daily.   Yes Historical Provider, MD  atorvastatin (LIPITOR) 20 MG tablet Take 20 mg by mouth at bedtime.    Yes Historical Provider, MD  digoxin (LANOXIN) 0.125 MG tablet Take 1 tablet (0.125 mg total) by mouth daily. 11/01/15  Yes Dustin Flock, MD  ferrous sulfate 325 (65 FE) MG tablet Take 1 tablet (325 mg total) by mouth daily. 11/01/15  Yes Dustin Flock, MD  furosemide (LASIX) 40 MG tablet Take 40 mg by mouth 2 (two) times a week. Pt takes on Monday and Friday.   Yes Historical Provider, MD  metoprolol (LOPRESSOR) 50 MG tablet Take 1 tablet (50 mg total) by mouth 2 (two) times daily. 11/01/15  Yes Dustin Flock, MD  nitroGLYCERIN (NITROSTAT) 0.4 MG SL tablet Place 0.4 mg under the tongue every 5 (five) minutes as needed for chest pain.   Yes Historical Provider, MD  pantoprazole (PROTONIX) 40 MG tablet Take 40 mg by mouth 2 (two) times daily before a meal.    Yes Historical Provider, MD  potassium chloride (K-DUR) 10 MEQ tablet Take 10 mEq by mouth 2 (two) times daily.   Yes Historical Provider, MD  senna (SENOKOT) 8.6 MG tablet Take 1 tablet by mouth at bedtime as needed for constipation.   Yes Historical Provider, MD  sucralfate (CARAFATE) 1 G tablet Take 1 g by mouth 4 (four) times daily -  with meals and at bedtime.    Yes Historical Provider, MD  tamsulosin (FLOMAX) 0.4 MG CAPS capsule Take 0.4 mg by mouth daily after supper.    Yes Historical Provider, MD  traMADol (ULTRAM) 50 MG tablet Take 1 tablet (50 mg total) by mouth every 6 (six) hours as needed. 11/02/15 11/01/16  Nance Pear, MD  vitamin C (VITAMIN C) 250 MG tablet Take 1 tablet (250 mg total) by mouth daily. 11/01/15   Dustin Flock, MD   Ct Head Wo Contrast  11/03/2015  CLINICAL  DATA:  Hip pain EXAM: CT HEAD WITHOUT CONTRAST TECHNIQUE: Contiguous axial images were obtained from the base of the skull through the vertex without intravenous contrast. COMPARISON:  None FINDINGS: Prominence of the sulci and ventricles identified compatible with brain atrophy. No evidence for acute brain infarct, acute intracranial hemorrhage or mass. Mild low attenuation within the subcortical and periventricular white matter is noted consistent with chronic microvascular disease. The paranasal sinuses and mastoid air cells are clear. The calvarium is intact. IMPRESSION: 1. Chronic microvascular disease and brain atrophy. 2. No acute findings. Electronically Signed   By: Kerby Moors M.D.   On: 11/03/2015 15:54   Ct Hip Left Wo Contrast  11/03/2015  CLINICAL DATA:  Hip pain starting today, worsening. Left leg weakness. Cortisone injection today. EXAM: CT OF  THE LEFT HIP WITHOUT CONTRAST TECHNIQUE: Multidetector CT imaging of the left hip was performed according to the standard protocol. Multiplanar CT image reconstructions were also generated. COMPARISON:  11/02/2015 FINDINGS: 2.4 by 1.6 by 1.6 cm lucent lesion in the left femoral neck, internal density approximately -30 Hounsfield units, fairly similar size back in 2012. Mild chondrocalcinosis along the acetabular labrum. Minimal spurring of the acetabulum and femoral head. There appears to be some subtle periosteal reaction along the lateral margin left femoral neck extending to the greater trochanter, not readily seen on the prior CT scan of 10/28/2015. I do not perceive a definite hip joint effusion. No definite regional bursitis. Small lipoma within the left adductor magnus, image 81/4. Iliac and femoral atherosclerotic calcification. IMPRESSION: 1. New abnormal periosteal reaction along the upper margin of the left femoral neck and greater trochanter, raising suspicion for an underlying stress fracture or otherwise occult injury along the femoral neck  or greater trochanter region. There is enough spurring in the region that a subtle nondisplaced fracture could conceivably be occult. MRI could help in further workup if clinically warranted. 2. Low-density lucent lesion centrally along the base of the left femoral neck, not appreciably changed from 2012, hence likely benign. This does not have a sclerotic margin and could represent a small fatty lesion given the low-density. 3. Chondrocalcinosis along the left acetabular labrum, raising the possibility of CPPD arthropathy. 4. Small lipoma along the left adductor magnus. 5. Atherosclerosis. Electronically Signed   By: Van Clines M.D.   On: 11/03/2015 16:10   Dg Hip Unilat With Pelvis 2-3 Views Left  11/02/2015  CLINICAL DATA:  Acute onset of left-sided hip pain. Initial encounter. EXAM: DG HIP (WITH OR WITHOUT PELVIS) 2-3V LEFT COMPARISON:  None. FINDINGS: There is no evidence of fracture or dislocation. Both femoral heads are seated normally within their respective acetabula. The proximal left femur appears intact. No significant degenerative change is appreciated. The sacroiliac joints are unremarkable in appearance. The visualized bowel gas pattern is grossly unremarkable in appearance. Contrast is seen within the rectum. Postoperative change is noted at the right lower quadrant. Scattered phleboliths are noted within the pelvis. IMPRESSION: No evidence of fracture or dislocation. Electronically Signed   By: Garald Balding M.D.   On: 11/02/2015 21:00    Positive ROS: All other systems have been reviewed and were otherwise negative with the exception of those mentioned in the HPI and as above.  Physical Exam: General:  Alert, no acute distress Psychiatric:  Patient is competent for consent with normal mood and affect   Cardiovascular:  No pedal edema Respiratory:  No wheezing, non-labored breathing GI:  Abdomen is soft and non-tender Skin:  No lesions in the area of chief  complaint Neurologic:  Sensation intact distally Lymphatic:  No axillary or cervical lymphadenopathy  Orthopedic Exam:  Orthopedic examination is limited to the left hip and lower extremity. Skin inspection around the left hip is unremarkable. His leg lengths are equal and leg rotation is symmetric. There is no swelling, ecchymosis, erythema, or abrasions. He has mild tenderness to palpation over the trochanteric region laterally. He has increased pain with attempted active hip range of motion, as well as with gentle log rolling of the left lower extremity. He is neurovascularly intact to the left lower extremity and foot.  X-rays:  A CT scan of the left hip has been performed and is available for review. The findings were as described above.  Assessment: Probable stress fracture left hip  with possible pathologic component secondary to lytic process within the intertrochanteric region.  Plan: The treatment options are discussed with the patient and his daughter, who is at the bedside. The patient has a history of chronic anemia for which she has seen on collagenase on several occasions. They're worried about some sort of myelodysplastic process, but have not performed a bone marrow biopsy as he has remained asymptomatic. Given the lytic processes identified on the CT scan, I would like for the patient to undergo an MRI scan of the left hip to better elucidate this lesion as well as to verify or refute the presence of a stress fracture. Assuming that there is indeed a stress fracture, especially given the patient's symptoms and inability to bear weight on the left side, I feel that the patient would best benefit from a surgical stabilization procedure, specifically a trochanteric femoral nailing of the left femur, especially if there is a pathologic component to it. However, the patient is on eloquence and did take a dose this morning, so surgery is to be delayed at least 2 days. In addition, the  patient apparently has a significant cardiac history and will need cardiac clearance prior to any surgical procedure.  Thank you for asking me to participate in the care of this most pleasant man. I will be happy to follow him with you.   Pascal Lux, MD  Beeper #:  [phone]  11/03/2015 6:26 PM

## 2015-11-03 NOTE — ED Notes (Signed)
Pt to ed with c/o hip pain that started Tuesday while he was still in hospital.  Pt was able to walk on Tuesday, however pain worsened yesterday and pt was unable to walk.  Pt was seen here in ED yesterday and was d/c home, however today family report pt is unable to control his legs and walk today.

## 2015-11-03 NOTE — ED Provider Notes (Signed)
[facility] Emergency Department Provider Note  Time seen: 2:32 PM  I have reviewed the triage vital signs and the nursing notes.   HISTORY  Chief Complaint Hip Pain and Difficulty Walking    HPI Andres Gutierrez is a 82 y.o. male with a past medical history of hypertension, MI, CABG, cardiomyopathy, anemia, who presents the emergency department with difficulty walking. According to the patient and record review the patient was discharged from the hospital 3 days ago after a stay for congestive heart failure and abdominal pain with a normal HIDA scan. Patient states he abdominal pain had completely resolved. He denies any leg pain upon discharge however shortly after going home began experiencing significant left hip pain.Patient return to the emergency department yesterday had a normal x-ray. Continued to have pain in the left hip, went to see Dr. Sabra Heck today who performed a corticosteroid injection, continues to have pain and difficulty walking so he came to the emergency department for reevaluation. The patient describes a pain as mild, aching pain in the left hip but states he is having trouble moving his leg.      Past Medical History  Diagnosis Date  . Hypertension   . MI (myocardial infarction) ([place])     x 2  . Coronary artery disease   . Dysrhythmia   . Cardiomyopathy ([place])   . Lymphadenopathy, hilar   . Wheezing   . Cough   . Anemia   . Anxiety   . Chronic kidney disease     kidney stones    Patient Active Problem List   Diagnosis Date Noted  . Atypical pneumonia   . Chronic atrial fibrillation ([place])   . Coronary artery disease involving native coronary artery of native heart without angina pectoris   . Acalculous cholecystitis 10/29/2015  . Adenopathy   . Chest pain 12/22/2014  . New onset atrial flutter ([place]) 12/22/2014  . Abnormal CT scan, chest 12/22/2014  . CAD (coronary artery disease) 12/22/2014  . HTN (hypertension) 12/22/2014     Past Surgical History  Procedure Laterality Date  . Coronary artery bypass graft    . Coronary angioplasty with stent placement    . Endobronchial ultrasound N/A 12/31/2014    Procedure: ENDOBRONCHIAL ULTRASOUND;  Surgeon: Flora Lipps, MD;  Location: [facility] ORS;  Service: Cardiopulmonary;  Laterality: N/A;  . Bronchial needle aspiration biopsy N/A 12/31/2014    Procedure: BRONCHIAL NEEDLE ASPIRATION BIOPSIES from carina;  Surgeon: Flora Lipps, MD;  Location: [facility] ORS;  Service: Cardiopulmonary;  Laterality: N/A;  . Esophagogastroduodenoscopy (egd) with propofol N/A 06/20/2015    Procedure: ESOPHAGOGASTRODUODENOSCOPY (EGD) WITH PROPOFOL;  Surgeon: Lollie Sails, MD;  Location: [facility] ENDOSCOPY;  Service: Endoscopy;  Laterality: N/A;    Current Outpatient Rx  Name  Route  Sig  Dispense  Refill  . apixaban (ELIQUIS) 5 MG TABS tablet   Oral   Take 1 tablet (5 mg total) by mouth 2 (two) times daily.   60 tablet   11   . aspirin EC 81 MG tablet   Oral   Take 81 mg by mouth daily.         Marland Kitchen atorvastatin (LIPITOR) 20 MG tablet   Oral   Take 20 mg by mouth at bedtime.          . dicyclomine (BENTYL) 10 MG capsule   Oral   Take 10 mg by mouth 4 (four) times daily as needed for spasms.         Marland Kitchen  digoxin (LANOXIN) 0.125 MG tablet   Oral   Take 1 tablet (0.125 mg total) by mouth daily.   30 tablet   0   . ferrous sulfate 325 (65 FE) MG tablet   Oral   Take 1 tablet (325 mg total) by mouth daily.   30 tablet   3   . furosemide (LASIX) 40 MG tablet   Oral   Take 40 mg by mouth 2 (two) times a week. Pt takes on Monday and Friday.         . Iron-Vitamin C (VITRON-C) 65-125 MG TABS   Oral   Take 1 tablet by mouth daily.         . metoprolol (LOPRESSOR) 50 MG tablet   Oral   Take 1 tablet (50 mg total) by mouth 2 (two) times daily.   60 tablet   0   . nitroGLYCERIN (NITROSTAT) 0.4 MG SL tablet   Sublingual   Place 0.4 mg under the tongue every 5 (five) minutes as  needed for chest pain.         . pantoprazole (PROTONIX) 40 MG tablet   Oral   Take 40 mg by mouth 2 (two) times daily before a meal.          . potassium chloride (K-DUR) 10 MEQ tablet   Oral   Take 10 mEq by mouth 2 (two) times daily.         . ranitidine (ZANTAC) 300 MG tablet   Oral   Take 300 mg by mouth at bedtime.         . senna (SENOKOT) 8.6 MG tablet   Oral   Take 1 tablet by mouth at bedtime as needed for constipation.         . sucralfate (CARAFATE) 1 G tablet   Oral   Take 1 g by mouth 4 (four) times daily -  with meals and at bedtime.          . tamsulosin (FLOMAX) 0.4 MG CAPS capsule   Oral   Take 0.4 mg by mouth daily after supper.          . traMADol (ULTRAM) 50 MG tablet   Oral   Take 1 tablet (50 mg total) by mouth every 6 (six) hours as needed.   12 tablet   0   . vitamin C (VITAMIN C) 250 MG tablet   Oral   Take 1 tablet (250 mg total) by mouth daily.   30 tablet   0     Allergies Diphenhydramine hcl; Escitalopram; Maxidex; Prednisone; Serotonin; Vytorin; and Zocor  Family History  Problem Relation Age of Onset  . CAD Mother   . Colon cancer Father     Social History Social History  Substance Use Topics  . Smoking status: Former Smoker    Quit date: 06/27/1977  . Smokeless tobacco: Current User    Types: Chew  . Alcohol Use: Yes     Comment: occational almost rare once a year    Review of Systems Constitutional: Negative for fever. Cardiovascular: Negative for chest pain. Respiratory: Negative for shortness of breath. Gastrointestinal: Negative for abdominal pain Musculoskeletal: Mild to moderate left hip pain. Skin: Negative for rash. Neurological: Negative for headaches, focal weakness or numbness.  10-point ROS otherwise negative.  ____________________________________________   PHYSICAL EXAM:  VITAL SIGNS: ED Triage Vitals  Enc Vitals Group     BP 11/03/15 1155 100/61 mmHg     Pulse Rate 11/03/15  1155 76     Resp 11/03/15 1155 18     Temp 11/03/15 1155 98.3 F (36.8 C)     Temp Source 11/03/15 1155 Oral     SpO2 11/03/15 1155 96 %     Weight 11/03/15 1155 195 lb (88.451 kg)     Height 11/03/15 1155 6\' 4"  (1.93 m)     Head Cir --      Peak Flow --      Pain Score 11/03/15 1152 0     Pain Loc --      Pain Edu? --      Excl. in [place]? --     Constitutional: Alert and oriented. Well appearing and in no distress. Eyes: Normal exam ENT   Head: Normocephalic and atraumatic   Mouth/Throat: Mucous membranes are moist. Cardiovascular: Normal rate, regular rhythm. No murmur Respiratory: Normal respiratory effort without tachypnea nor retractions. Breath sounds are clear Gastrointestinal: Soft and nontender. No distention.   Musculoskeletal: Mild left hip tenderness palpation. Normal range of motion although with mild pain in the left hip. Neurovascularly intact distally with 2+ DP pulses. Neurologic:  Normal speech and language. 4+/5 motor in left lower extremity which the patient relates more to the left hip pain. Otherwise normal neurologic exam including no pronator drift, cranial nerves intact, 5/5 motor with equal grip strength in the upper extremities, sensation intact and equal in all extremities. Skin:  Skin is warm, dry and intact.  Psychiatric: Mood and affect are normal. Speech and behavior are normal.   ____________________________________________    EKG  EKG reviewed and interpreted by myself shows normal sinus rhythm at 76 bpm, slightly prolonged PR interval consistent with first-degree AV block, slight prolongation of QRS interval, nonspecific ST changes without obvious ST elevation. Right axis.  ____________________________________________    RADIOLOGY  CT head shows no acute abnormality CT left hip shows possible stress fracture.  ____________________________________________    INITIAL IMPRESSION / ASSESSMENT AND PLAN / ED COURSE  Pertinent labs &  imaging results that were available during my care of the patient were reviewed by me and considered in my medical decision making (see chart for details).  The patient returns the emergency department difficulty walking, pain in the left hip. Mild tenderness palpation of the left hip, good range of motion with passive and active movement although states some pain. Patient states he is having difficulty moving the left leg which she relates more to the left hip pain. Patient does have mild weakness of the left leg but again difficult to ascertain if this is due to pain or central weakness. We'll obtain a CT scan of patient's head, basic labs, CT of the left hip and monitor in the emergency department.  CT head is a possible fracture. CT head negative. Labs are largely within normal limits. I discussed with Dr. Roland Rack who recommends medical admission. Hospitalist will be admitting the patient for further workup and orthopedic consultation.    ____________________________________________   FINAL CLINICAL IMPRESSION(S) / ED DIAGNOSES  Left hip pain Left femoral neck fracture  Harvest Dark, MD 11/03/15 1758

## 2015-11-03 NOTE — ED Notes (Signed)
Informed RN bed Ready  [phone]

## 2015-11-03 NOTE — H&P (Signed)
[place] [facility] [place] NAME: Andres Gutierrez    MR#:  [id number]  DATE OF BIRTH:  [birth year]   DATE OF ADMISSION:  11/03/2015  PRIMARY CARE PHYSICIAN: Rusty Aus, MD   REQUESTING/REFERRING PHYSICIAN:  [place]  CHIEF COMPLAINT:   Chief Complaint  Patient presents with  . Hip Pain  . Difficulty Walking    HISTORY OF PRESENT ILLNESS:  Andres Gutierrez  is a 82 y.o. male with a known history of  Chronic atrial fibrillation, congestive heart failure who is presenting with left leg pain. Patient states he's had 2-3 days of left hip pain described only as "pain" worse with walking relief with rest nonradiating intensity 10/10 at maximum. Denies associated symptoms. Saw his PCP Emily Filbert underwent steroid injection presented to Hospital given worsening symptoms underwent imaging found to haveLeft femoral neck fracture CT findings below  New abnormal periosteal reaction along the upper margin of the left femoral neck and greater trochanter, raising suspicion for an underlying stress fracture or otherwise occult injury along the femoral neck or greater trochanter region  PAST MEDICAL HISTORY:   Past Medical History  Diagnosis Date  . Hypertension   . MI (myocardial infarction) ([place])     x 2  . Coronary artery disease   . Dysrhythmia   . Cardiomyopathy ([place])   . Lymphadenopathy, hilar   . Wheezing   . Cough   . Anemia   . Anxiety   . Chronic kidney disease     kidney stones    PAST SURGICAL HISTORY:   Past Surgical History  Procedure Laterality Date  . Coronary artery bypass graft    . Coronary angioplasty with stent placement    . Endobronchial ultrasound N/A 12/31/2014    Procedure: ENDOBRONCHIAL ULTRASOUND;  Surgeon: Flora Lipps, MD;  Location: [facility] ORS;  Service: Cardiopulmonary;  Laterality: N/A;  . Bronchial needle aspiration biopsy N/A 12/31/2014    Procedure: BRONCHIAL NEEDLE ASPIRATION BIOPSIES from carina;  Surgeon: Flora Lipps, MD;  Location: [facility] ORS;  Service: Cardiopulmonary;  Laterality: N/A;  . Esophagogastroduodenoscopy (egd) with propofol N/A 06/20/2015    Procedure: ESOPHAGOGASTRODUODENOSCOPY (EGD) WITH PROPOFOL;  Surgeon: Lollie Sails, MD;  Location: [facility] ENDOSCOPY;  Service: Endoscopy;  Laterality: N/A;    SOCIAL HISTORY:   Social History  Substance Use Topics  . Smoking status: Former Smoker    Quit date: 06/27/1977  . Smokeless tobacco: Current User    Types: Chew  . Alcohol Use: Yes     Comment: occational almost rare once a year    FAMILY HISTORY:   Family History  Problem Relation Age of Onset  . CAD Mother   . Colon cancer Father     DRUG ALLERGIES:   Allergies  Allergen Reactions  . Diphenhydramine Hcl Other (See Comments)    Reaction:  Rash and fever a long time ago, but has had it since with no problem.  . Escitalopram Other (See Comments)    Reaction:  Makes him feel faint, like he was going to have a heart attack.  . Maxidex [Dexamethasone] Other (See Comments)    Reaction:  Unknown   . Prednisone Other (See Comments)    Pt states that this medication made him feel crazy.    . Serotonin Other (See Comments)    Tried 2 different types, lexapro and another one.  Reaction:  Made him feel like he was having a heart attack.   . Vytorin [Ezetimibe-Simvastatin]  Other (See Comments)    Reaction:  Unknown   . Zocor [Simvastatin] Other (See Comments)    Reaction:  Unknown     REVIEW OF SYSTEMS:  REVIEW OF SYSTEMS:  CONSTITUTIONAL: Denies fevers, chills, fatigue, weakness.  EYES: Denies blurred vision, double vision, or eye pain.  EARS, NOSE, THROAT: Denies tinnitus, ear pain, hearing loss.  RESPIRATORY: denies cough, shortness of breath, wheezing  CARDIOVASCULAR: Denies chest pain, palpitations, edema.  GASTROINTESTINAL: Denies nausea, vomiting, diarrhea, abdominal pain.  GENITOURINARY: Denies dysuria, hematuria.  ENDOCRINE: Denies nocturia or thyroid  problems. HEMATOLOGIC AND LYMPHATIC: Denies easy bruising or bleeding.  SKIN: Denies rash or lesions.  MUSCULOSKELETAL: Denies pain in neck, back, shoulder, knees, , or further arthritic symptoms. Positive left hip pain as above NEUROLOGIC: Denies paralysis, paresthesias.  PSYCHIATRIC: Denies anxiety or depressive symptoms. Otherwise full review of systems performed by me is negative.   MEDICATIONS AT HOME:   Prior to Admission medications   Medication Sig Start Date End Date Taking? Authorizing Provider  apixaban (ELIQUIS) 5 MG TABS tablet Take 1 tablet (5 mg total) by mouth 2 (two) times daily. 12/23/14  Yes Rusty Aus, MD  aspirin EC 81 MG tablet Take 81 mg by mouth daily.   Yes Historical Provider, MD  atorvastatin (LIPITOR) 20 MG tablet Take 20 mg by mouth at bedtime.    Yes Historical Provider, MD  digoxin (LANOXIN) 0.125 MG tablet Take 1 tablet (0.125 mg total) by mouth daily. 11/01/15  Yes Dustin Flock, MD  ferrous sulfate 325 (65 FE) MG tablet Take 1 tablet (325 mg total) by mouth daily. 11/01/15  Yes Dustin Flock, MD  furosemide (LASIX) 40 MG tablet Take 40 mg by mouth 2 (two) times a week. Pt takes on Monday and Friday.   Yes Historical Provider, MD  metoprolol (LOPRESSOR) 50 MG tablet Take 1 tablet (50 mg total) by mouth 2 (two) times daily. 11/01/15  Yes Dustin Flock, MD  nitroGLYCERIN (NITROSTAT) 0.4 MG SL tablet Place 0.4 mg under the tongue every 5 (five) minutes as needed for chest pain.   Yes Historical Provider, MD  pantoprazole (PROTONIX) 40 MG tablet Take 40 mg by mouth 2 (two) times daily before a meal.    Yes Historical Provider, MD  potassium chloride (K-DUR) 10 MEQ tablet Take 10 mEq by mouth 2 (two) times daily.   Yes Historical Provider, MD  senna (SENOKOT) 8.6 MG tablet Take 1 tablet by mouth at bedtime as needed for constipation.   Yes Historical Provider, MD  sucralfate (CARAFATE) 1 G tablet Take 1 g by mouth 4 (four) times daily -  with meals and at  bedtime.    Yes Historical Provider, MD  tamsulosin (FLOMAX) 0.4 MG CAPS capsule Take 0.4 mg by mouth daily after supper.    Yes Historical Provider, MD  traMADol (ULTRAM) 50 MG tablet Take 1 tablet (50 mg total) by mouth every 6 (six) hours as needed. 11/02/15 11/01/16  Nance Pear, MD  vitamin C (VITAMIN C) 250 MG tablet Take 1 tablet (250 mg total) by mouth daily. 11/01/15   Dustin Flock, MD      VITAL SIGNS:  Blood pressure 100/61, pulse 76, temperature 98.3 F (36.8 C), temperature source Oral, resp. rate 18, height 6\' 4"  (1.93 m), weight 195 lb (88.451 kg), SpO2 96 %.  PHYSICAL EXAMINATION:  VITAL SIGNS: Filed Vitals:   11/03/15 1155  BP: 100/61  Pulse: 76  Temp: 98.3 F (36.8 C)  Resp: 18   GENERAL:82  y.o.male currently in no acute distress.  HEAD: Normocephalic, atraumatic.  EYES: Pupils equal, round, reactive to light. Extraocular muscles intact. No scleral icterus.  MOUTH: Moist mucosal membrane. Dentition intact. No abscess noted.  EAR, NOSE, THROAT: Clear without exudates. No external lesions.  NECK: Supple. No thyromegaly. No nodules. No JVD.  PULMONARY: Clear to ascultation, without wheeze rails or rhonci. No use of accessory muscles, Good respiratory effort. good air entry bilaterally CHEST: Nontender to palpation.  CARDIOVASCULAR: S1 and S2. Currently Regular rate and rhythm. No murmurs, rubs, or gallops. No edema. Pedal pulses 2+ bilaterally.  GASTROINTESTINAL: Soft, nontender, nondistended. No masses. Positive bowel sounds. No hepatosplenomegaly.  MUSCULOSKELETAL: No swelling, clubbing, or edema. Range of motion Limited left lower extremity given pain NEUROLOGIC: Cranial nerves II through XII are intact. No gross focal neurological deficits. Sensation intact. Reflexes intact.  SKIN: No ulceration, lesions, rashes, or cyanosis. Skin warm and dry. Turgor intact.  PSYCHIATRIC: Mood, affect within normal limits. The patient is awake, alert and oriented x 3. Insight,  judgment intact.    LABORATORY PANEL:   CBC  Recent Labs Lab 11/03/15 1200  WBC 6.2  HGB 12.1*  HCT 36.7*  PLT 101*   ------------------------------------------------------------------------------------------------------------------  Chemistries   Recent Labs Lab 10/28/15 1900  11/03/15 1200  NA 137  < > 135  K 4.4  < > 4.0  CL 106  < > 101  CO2 19*  < > 26  GLUCOSE 115*  < > 105*  BUN 32*  < > 35*  CREATININE 1.56*  < > 1.38*  CALCIUM 9.6  < > 9.2  AST 24  --   --   ALT 23  --   --   ALKPHOS 57  --   --   BILITOT 1.8*  --   --   < > = values in this interval not displayed. ------------------------------------------------------------------------------------------------------------------  Cardiac Enzymes  Recent Labs Lab 10/29/15 1251  TROPONINI 0.03   ------------------------------------------------------------------------------------------------------------------  RADIOLOGY:  Ct Head Wo Contrast  11/03/2015  CLINICAL DATA:  Hip pain EXAM: CT HEAD WITHOUT CONTRAST TECHNIQUE: Contiguous axial images were obtained from the base of the skull through the vertex without intravenous contrast. COMPARISON:  None FINDINGS: Prominence of the sulci and ventricles identified compatible with brain atrophy. No evidence for acute brain infarct, acute intracranial hemorrhage or mass. Mild low attenuation within the subcortical and periventricular white matter is noted consistent with chronic microvascular disease. The paranasal sinuses and mastoid air cells are clear. The calvarium is intact. IMPRESSION: 1. Chronic microvascular disease and brain atrophy. 2. No acute findings. Electronically Signed   By: Kerby Moors M.D.   On: 11/03/2015 15:54   Ct Hip Left Wo Contrast  11/03/2015  CLINICAL DATA:  Hip pain starting today, worsening. Left leg weakness. Cortisone injection today. EXAM: CT OF THE LEFT HIP WITHOUT CONTRAST TECHNIQUE: Multidetector CT imaging of the left hip was  performed according to the standard protocol. Multiplanar CT image reconstructions were also generated. COMPARISON:  11/02/2015 FINDINGS: 2.4 by 1.6 by 1.6 cm lucent lesion in the left femoral neck, internal density approximately -30 Hounsfield units, fairly similar size back in 2012. Mild chondrocalcinosis along the acetabular labrum. Minimal spurring of the acetabulum and femoral head. There appears to be some subtle periosteal reaction along the lateral margin left femoral neck extending to the greater trochanter, not readily seen on the prior CT scan of 10/28/2015. I do not perceive a definite hip joint effusion. No definite regional bursitis. Small  lipoma within the left adductor magnus, image 81/4. Iliac and femoral atherosclerotic calcification. IMPRESSION: 1. New abnormal periosteal reaction along the upper margin of the left femoral neck and greater trochanter, raising suspicion for an underlying stress fracture or otherwise occult injury along the femoral neck or greater trochanter region. There is enough spurring in the region that a subtle nondisplaced fracture could conceivably be occult. MRI could help in further workup if clinically warranted. 2. Low-density lucent lesion centrally along the base of the left femoral neck, not appreciably changed from 2012, hence likely benign. This does not have a sclerotic margin and could represent a small fatty lesion given the low-density. 3. Chondrocalcinosis along the left acetabular labrum, raising the possibility of CPPD arthropathy. 4. Small lipoma along the left adductor magnus. 5. Atherosclerosis. Electronically Signed   By: Van Clines M.D.   On: 11/03/2015 16:10   Dg Hip Unilat With Pelvis 2-3 Views Left  11/02/2015  CLINICAL DATA:  Acute onset of left-sided hip pain. Initial encounter. EXAM: DG HIP (WITH OR WITHOUT PELVIS) 2-3V LEFT COMPARISON:  None. FINDINGS: There is no evidence of fracture or dislocation. Both femoral heads are seated  normally within their respective acetabula. The proximal left femur appears intact. No significant degenerative change is appreciated. The sacroiliac joints are unremarkable in appearance. The visualized bowel gas pattern is grossly unremarkable in appearance. Contrast is seen within the rectum. Postoperative change is noted at the right lower quadrant. Scattered phleboliths are noted within the pelvis. IMPRESSION: No evidence of fracture or dislocation. Electronically Signed   By: Garald Balding M.D.   On: 11/02/2015 21:00    EKG:   Orders placed or performed during the hospital encounter of 11/03/15  . ED EKG  . ED EKG    IMPRESSION AND PLAN:   82 year old Caucasian gentleman history of chronic atrial fibrillation on apixiban for anticoagulation presenting with left hip pain.  1. Preoperative evaluation, left femoral neck fracture: Orthopedic evaluation is already taken place, patient considered moderate risk for moderate risk surgery from cardiac standpoint however will require to be off of his anticoagulation for 48 hours prior to surgery he did take his medication the morning of 11/03/15 meaning that surgery could take place on Sunday, 11/06/2015. Given his recent admission to the hospital for A. fib rapid ventricular response, congestive heart failure exacerbation we'll consult cardiology for their expertise prior to surgery. 2. Essential hypertension: Continue beta-blockade 3. Hyperlipidemia statin therapy    All the records are reviewed and case discussed with ED provider. Management plans discussed with the patient, family and they are in agreement.  CODE STATUS: Full  TOTAL TIME TAKING CARE OF THIS PATIENT: 33 minutes.    Hower,  Karenann Cai.D on 11/03/2015 at 6:33 PM  Between 7am to 6pm - Pager - [phone]  After 6pm: House Pager: - [phone]  [facility]  Office  [phone]  CC: Primary care physician; Rusty Aus, MD

## 2015-11-03 NOTE — ED Notes (Signed)
Patient went to see Dr. Sabra Heck this morning to receive a cortisone shot in the left hip.  Patient has had no relief since the cortisone shot was administer.  Patient has been having pain in the left hip and it is no progressing into the right hip.  Patient unable to ambulate without assistance from wheelchair to the bed.

## 2015-11-04 ENCOUNTER — Inpatient Hospital Stay: Payer: PPO

## 2015-11-04 LAB — BASIC METABOLIC PANEL
ANION GAP: 8 (ref 5–15)
BUN: 31 mg/dL — AB (ref 6–20)
CALCIUM: 8.7 mg/dL — AB (ref 8.9–10.3)
CO2: 26 mmol/L (ref 22–32)
Chloride: 102 mmol/L (ref 101–111)
Creatinine, Ser: 1.19 mg/dL (ref 0.61–1.24)
GFR calc Af Amer: >60 mL/min (ref >60)
GFR, EST NON AFRICAN AMERICAN: 55 mL/min — AB (ref >60)
GLUCOSE: 106 mg/dL — AB (ref 65–99)
POTASSIUM: 4.1 mmol/L (ref 3.5–5.1)
SODIUM: 136 mmol/L (ref 135–145)

## 2015-11-04 LAB — CBC
HCT: 33.4 % — ABNORMAL LOW (ref 40.0–52.0)
HEMOGLOBIN: 11.3 g/dL — AB (ref 13.0–18.0)
MCH: 32.6 pg (ref 26.0–34.0)
MCHC: 34 g/dL (ref 32.0–36.0)
MCV: 95.9 fL (ref 80.0–100.0)
PLATELETS: 96 10*3/uL — AB (ref 150–440)
RBC: 3.48 MIL/uL — AB (ref 4.40–5.90)
RDW: 17.9 % — ABNORMAL HIGH (ref 11.5–14.5)
WBC: 4.9 10*3/uL (ref 3.8–10.6)

## 2015-11-04 LAB — PROTIME-INR
INR: 1.4
PROTHROMBIN TIME: 17.3 s — AB (ref 11.4–15.0)

## 2015-11-04 LAB — APTT: APTT: 38 s — AB (ref 24–36)

## 2015-11-04 LAB — HEPARIN LEVEL (UNFRACTIONATED): HEPARIN UNFRACTIONATED: 2.96 [IU]/mL — AB (ref 0.30–0.70)

## 2015-11-04 MED ORDER — GADOBENATE DIMEGLUMINE 529 MG/ML IV SOLN
20.0000 mL | Freq: Once | INTRAVENOUS | Status: AC | PRN
  Administered 2015-11-04: 18 mL via INTRAVENOUS

## 2015-11-04 MED ORDER — HEPARIN (PORCINE) IN NACL 100-0.45 UNIT/ML-% IJ SOLN
1050.0000 [IU]/h | INTRAMUSCULAR | Status: DC
  Filled 2015-11-04: qty 250

## 2015-11-04 MED ORDER — APIXABAN 5 MG PO TABS
5.0000 mg | ORAL_TABLET | Freq: Two times a day (BID) | ORAL | Status: DC
  Administered 2015-11-04 – 2015-11-05 (×2): 5 mg via ORAL
  Filled 2015-11-04 (×2): qty 1

## 2015-11-04 NOTE — Progress Notes (Signed)
[place] [facility] [place] NAME: Andres Gutierrez    MR#:  [id number]  DATE OF BIRTH:  04/30/1931  SUBJECTIVE:   Patient here due to a suspected left femoral neck fracture, although MRI Hip showing no evidence of left Hip fracture.  Still having some left hip pain.  Family at bedside. No chest pain, shortness of breath, palpitation.    REVIEW OF SYSTEMS:    Review of Systems  Constitutional: Negative for fever and chills.  HENT: Negative for congestion and tinnitus.   Eyes: Negative for blurred vision and double vision.  Respiratory: Negative for cough, shortness of breath and wheezing.   Cardiovascular: Negative for chest pain, orthopnea and PND.  Gastrointestinal: Negative for nausea, vomiting, abdominal pain and diarrhea.  Genitourinary: Negative for dysuria and hematuria.  Neurological: Negative for dizziness, sensory change and focal weakness.  All other systems reviewed and are negative.   Nutrition: Heart Healty Tolerating Diet: Yes Tolerating PT: Await eval.    DRUG ALLERGIES:   Allergies  Allergen Reactions  . Diphenhydramine Hcl Other (See Comments)    Reaction:  Rash and fever a long time ago, but has had it since with no problem.  . Escitalopram Other (See Comments)    Reaction:  Makes him feel faint, like he was going to have a heart attack.  . Maxidex [Dexamethasone] Other (See Comments)    Reaction:  Unknown   . Prednisone Other (See Comments)    Pt states that this medication made him feel crazy.    . Serotonin Other (See Comments)    Tried 2 different types, lexapro and another one.  Reaction:  Made him feel like he was having a heart attack.   . Vytorin [Ezetimibe-Simvastatin] Other (See Comments)    Reaction:  Unknown   . Zocor [Simvastatin] Other (See Comments)    Reaction:  Unknown     VITALS:  Blood pressure 96/48, pulse 60, temperature 97.9 F (36.6 C), temperature source Oral, resp. rate 18, height 6\' 4"  (1.93  m), weight 88.451 kg (195 lb), SpO2 97 %.  PHYSICAL EXAMINATION:   Physical Exam  GENERAL:  82 y.o.-year-old patient lying in the bed in no acute distress.  EYES: Pupils equal, round, reactive to light and accommodation. No scleral icterus. Extraocular muscles intact.  HEENT: Head atraumatic, normocephalic. Oropharynx and nasopharynx clear.  NECK:  Supple, no jugular venous distention. No thyroid enlargement, no tenderness.  LUNGS: Normal breath sounds bilaterally, no wheezing, rales, rhonchi. No use of accessory muscles of respiration.  CARDIOVASCULAR: S1, S2, Irregular. No murmurs, rubs, or gallops.  ABDOMEN: Soft, nontender, nondistended. Bowel sounds present. No organomegaly or mass.  EXTREMITIES: No cyanosis, clubbing or edema b/l.    NEUROLOGIC: Cranial nerves II through XII are intact. No focal Motor or sensory deficits b/l.   PSYCHIATRIC: The patient is alert and oriented x 3.  SKIN: No obvious rash, lesion, or ulcer.    LABORATORY PANEL:   CBC  Recent Labs Lab 11/04/15 0545  WBC 4.9  HGB 11.3*  HCT 33.4*  PLT 96*   ------------------------------------------------------------------------------------------------------------------  Chemistries   Recent Labs Lab 10/28/15 1900  11/04/15 0545  NA 137  < > 136  K 4.4  < > 4.1  CL 106  < > 102  CO2 19*  < > 26  GLUCOSE 115*  < > 106*  BUN 32*  < > 31*  CREATININE 1.56*  < > 1.19  CALCIUM 9.6  < >  8.7*  AST 24  --   --   ALT 23  --   --   ALKPHOS 57  --   --   BILITOT 1.8*  --   --   < > = values in this interval not displayed. ------------------------------------------------------------------------------------------------------------------  Cardiac Enzymes  Recent Labs Lab 10/29/15 1251  TROPONINI 0.03   ------------------------------------------------------------------------------------------------------------------  RADIOLOGY:  Ct Head Wo Contrast  11/03/2015  CLINICAL DATA:  Hip pain EXAM: CT HEAD  WITHOUT CONTRAST TECHNIQUE: Contiguous axial images were obtained from the base of the skull through the vertex without intravenous contrast. COMPARISON:  None FINDINGS: Prominence of the sulci and ventricles identified compatible with brain atrophy. No evidence for acute brain infarct, acute intracranial hemorrhage or mass. Mild low attenuation within the subcortical and periventricular white matter is noted consistent with chronic microvascular disease. The paranasal sinuses and mastoid air cells are clear. The calvarium is intact. IMPRESSION: 1. Chronic microvascular disease and brain atrophy. 2. No acute findings. Electronically Signed   By: Kerby Moors M.D.   On: 11/03/2015 15:54   Mr Hip Left W Wo Contrast  11/04/2015  CLINICAL DATA:  Acute onset of left hip pain 2-3 days ago. Abnormal CT scan on 11/03/2015. EXAM: MRI OF THE LEFT HIP WITHOUT AND WITH CONTRAST TECHNIQUE: Multiplanar, multisequence MR imaging was performed both before and after administration of intravenous contrast. CONTRAST:  57mL MULTIHANCE GADOBENATE DIMEGLUMINE 529 MG/ML IV SOLN COMPARISON:  CT scans dated 11/03/2015 and 09/10/2010 FINDINGS: Bones: There is a benign 2 cm intraosseous lipoma in the left femoral neck. There is no fracture or bone destruction. Minimal degenerative changes of both femoral heads. Severe facet arthritis at L4-5 bilaterally with spondylolisthesis and severe spinal stenosis. This is chronic as compared to prior CT scans. Articular cartilage and labrum Articular cartilage: Uniform thinning of the articular cartilage of both hips. Labrum:  Intact. Joint or bursal effusion Joint effusion:  Small left hip effusion.  This is nonspecific. Bursae:  Normal. Muscles and tendons Muscles and tendons: Small intramuscular lipoma of the left adductor magnus muscle. Otherwise normal. Other findings Miscellaneous:  No adenopathy or mass lesions. IMPRESSION: 1. No evidence of fracture or stress injury of the proximal left  femur. 2. Small nonspecific left hip effusion suggesting synovitis. 3. Benign intraosseous lipoma of the left femoral neck. 4. Severe chronic spinal stenosis at L4-5. Electronically Signed   By: Lorriane Shire M.D.   On: 11/04/2015 11:08   Ct Hip Left Wo Contrast  11/03/2015  CLINICAL DATA:  Hip pain starting today, worsening. Left leg weakness. Cortisone injection today. EXAM: CT OF THE LEFT HIP WITHOUT CONTRAST TECHNIQUE: Multidetector CT imaging of the left hip was performed according to the standard protocol. Multiplanar CT image reconstructions were also generated. COMPARISON:  11/02/2015 FINDINGS: 2.4 by 1.6 by 1.6 cm lucent lesion in the left femoral neck, internal density approximately -30 Hounsfield units, fairly similar size back in 2012. Mild chondrocalcinosis along the acetabular labrum. Minimal spurring of the acetabulum and femoral head. There appears to be some subtle periosteal reaction along the lateral margin left femoral neck extending to the greater trochanter, not readily seen on the prior CT scan of 10/28/2015. I do not perceive a definite hip joint effusion. No definite regional bursitis. Small lipoma within the left adductor magnus, image 81/4. Iliac and femoral atherosclerotic calcification. IMPRESSION: 1. New abnormal periosteal reaction along the upper margin of the left femoral neck and greater trochanter, raising suspicion for an underlying stress fracture or  otherwise occult injury along the femoral neck or greater trochanter region. There is enough spurring in the region that a subtle nondisplaced fracture could conceivably be occult. MRI could help in further workup if clinically warranted. 2. Low-density lucent lesion centrally along the base of the left femoral neck, not appreciably changed from 2012, hence likely benign. This does not have a sclerotic margin and could represent a small fatty lesion given the low-density. 3. Chondrocalcinosis along the left acetabular labrum,  raising the possibility of CPPD arthropathy. 4. Small lipoma along the left adductor magnus. 5. Atherosclerosis. Electronically Signed   By: Van Clines M.D.   On: 11/03/2015 16:10   Dg Hip Unilat With Pelvis 2-3 Views Left  11/02/2015  CLINICAL DATA:  Acute onset of left-sided hip pain. Initial encounter. EXAM: DG HIP (WITH OR WITHOUT PELVIS) 2-3V LEFT COMPARISON:  None. FINDINGS: There is no evidence of fracture or dislocation. Both femoral heads are seated normally within their respective acetabula. The proximal left femur appears intact. No significant degenerative change is appreciated. The sacroiliac joints are unremarkable in appearance. The visualized bowel gas pattern is grossly unremarkable in appearance. Contrast is seen within the rectum. Postoperative change is noted at the right lower quadrant. Scattered phleboliths are noted within the pelvis. IMPRESSION: No evidence of fracture or dislocation. Electronically Signed   By: Garald Balding M.D.   On: 11/02/2015 21:00     ASSESSMENT AND PLAN:   82 year old male with past medical history of hypertension, coronary artery disease, history of cardiomyopathy, chronic kidney disease, anxiety, atrial fibrillation who presents to the hospital due to left hip pain and suspected to have a left hip fracture.  1. Left hip fracture-this is suspected based on imaging findings yesterday on CT scan and x-ray. -Seen by orthopedics and underwent MRI of the left hip which shows no evidence of acute fracture presently. -Await further orthopedic input regarding need for surgery. -Patient has been cleared from the medical and cardiac perspective for surgery.  2. Hx of chronic a. Fib - rate controlled. Cont. Metoprolol, Digoxin - hold Eliquis for possible need for hip surgery.  - will start on Heparin gtt for bridging before surgery.   3. Hx of BPH - no urinary retention.  - cont. Flomax.   4. Hyperlipidemia - cont. Atorvastatin  5.  GERD - cont.  Protonix.   6. Thrombocytopenia - mild and will monitor.  - no acute bleeding.    All the records are reviewed and case discussed with Care Management/Social Workerr. Management plans discussed with the patient, family and they are in agreement.  CODE STATUS: Full   DVT Prophylaxis: Heparin gtt  TOTAL TIME TAKING CARE OF THIS PATIENT: 30 minutes.   POSSIBLE D/C IN 2-3 DAYS, DEPENDING ON CLINICAL CONDITION.   Henreitta Leber M.D on 11/04/2015 at 3:11 PM  Between 7am to 6pm - Pager - [phone]  After 6pm go to [email or web address] - password [email or web address] [facility]  Office  [phone]  CC: Primary care physician; Rusty Aus, MD

## 2015-11-04 NOTE — Progress Notes (Signed)
Metoprolol held this AM due to low BP and HR. Will notify MD at rounds.

## 2015-11-04 NOTE — Clinical Social Work Note (Signed)
Clinical Social Work Assessment  Patient Details  Name: Andres Gutierrez MRN: [id number] Date of Birth: 02-Jul-1930  Date of referral:  11/04/15               Reason for consult:  Facility Placement                Permission sought to share information with:  Chartered certified accountant granted to share information::  Yes, Verbal Permission Granted  Name::      Andres Gutierrez::   Andres Gutierrez   Relationship::     Contact Information:     Housing/Transportation Living arrangements for the past 2 months:  Andres Gutierrez of Information:  Patient Patient Interpreter Needed:  None Criminal Activity/Legal Involvement Pertinent to Current Situation/Hospitalization:  No - Comment as needed Significant Relationships:  Adult Children Lives with:  Self Do you feel safe going back to the place where you live?  Yes Need for family participation in patient care:     Care giving concerns: Patient lives alone in [place].    Social Worker assessment / plan:  Holiday representative (CSW) met with patient prior to surgery with Dr. Roland Rack to discuss D/C plan. Per RN in progression rounds patient will have surgery Sunday or Monday when he has been off his blood thinner medicine for 48 hours. Patient was alert and oriented and was sitting up in the bed. CSW introduced self and explained role of CSW department. Per patient he lives alone in [place] and has 2 adult children, son Andres Gutierrez and daughter Andres Gutierrez. CSW explained that PT will evaluate patient after surgery and make a recommendation of home health or SNF. CSW also explained that patient's Health Team insurance will have to approve SNF. Patient prefers SNF and is agreeable to bed search in [facility]. Patient prefers [facility] or [facility]   FL2 complete and faxed out. CSW will continue to follow and assist as needed.    Employment status:  Retired Nurse, adult PT  Recommendations:  Not assessed at this time Information / Referral to community resources:  [place]  Patient/Family's Response to care: Patient's surgery is pending. Patient prefers SNF.   Patient/Family's Understanding of and Emotional Response to Diagnosis, Current Treatment, and Prognosis:  Patient was pleasant and thanked CSW for visit.   Emotional Assessment Appearance:  Appears stated age Attitude/Demeanor/Rapport:    Affect (typically observed):  Accepting, Adaptable, Pleasant Orientation:  Oriented to Self, Oriented to Place, Oriented to  Time, Oriented to Situation Alcohol / Substance use:  Not Applicable Psych involvement (Current and /or in the community):  No (Comment)  Discharge Needs  Concerns to be addressed:  Discharge Planning Concerns Readmission within the last 30 days:  No Current discharge risk:  Dependent with Mobility Barriers to Discharge:  Continued Medical Work up   Loralyn Freshwater, LCSW 11/04/2015, 3:24 PM

## 2015-11-04 NOTE — Clinical Social Work Placement (Signed)
   CLINICAL SOCIAL WORK PLACEMENT  NOTE  Date:  11/04/2015  Patient Details  Name: Andres Gutierrez MRN: [id number] Date of Birth: 02-28-1931  Clinical Social Work is seeking post-discharge placement for this patient at the [place] level of care (*CSW will initial, date and re-position this form in  chart as items are completed):  Yes   Patient/family provided with [place] Work Department's list of facilities offering this level of care within the geographic area requested by the patient (or if unable, by the patient's family).  Yes   Patient/family informed of their freedom to choose among providers that offer the needed level of care, that participate in Medicare, Medicaid or managed care program needed by the patient, have an available bed and are willing to accept the patient.  Yes   Patient/family informed of [facility]'s ownership interest in [facility] and [facility], as well as of the fact that they are under no obligation to receive care at these facilities.  PASRR submitted to EDS on 11/04/15     PASRR number received on 11/04/15     Existing PASRR number confirmed on       FL2 transmitted to all facilities in geographic area requested by pt/family on 11/04/15     FL2 transmitted to all facilities within larger geographic area on       Patient informed that his/her managed care company has contracts with or will negotiate with certain facilities, including the following:            Patient/family informed of bed offers received.  Patient chooses bed at       Physician recommends and patient chooses bed at      Patient to be transferred to   on  .  Patient to be transferred to facility by       Patient family notified on   of transfer.  Name of family member notified:        PHYSICIAN       Additional Comment:    _______________________________________________ Loralyn Freshwater, LCSW 11/04/2015, 3:24 PM

## 2015-11-04 NOTE — Clinical Documentation Improvement (Addendum)
Internal Medicine at [facility] and/or Consultants  Please document query responses in the progress notes and discharge summary, not on the CDI BPA form in [facility]. Thank you!  Query 1 of 2 Please document if a condition below provides greater specificity regarding the patient's ischemic cardiomyopathy and an EF of 35% as documented in the cardiology consult 11/04/15:  - Chronic Systolic Heart Failure  - Other condition  - Unable to clinically determine  Query 2 of 2 "Chronic Kidney disease" is documented in the past medical history of the H&P by Dr. Lavetta Nielsen.  If know or able to determine, please document the Stage of the patient's CKD:  CKD Stage I - GFR greater than or equal to 90  CKD Stage II - GFR 60-89  CKD Stage III - GFR 30-59  CKD Stage IV - GFR 15-29  CKD Stage V - GFR < 15  ESRD (End Stage Renal Disease  Other condition  Unable to clinically determine   Please exercise your independent, professional judgment when responding. A specific answer is not anticipated or expected.   Thank You, Erling Conte  RN BSN CCDS [phone] Health Information Management [place]

## 2015-11-04 NOTE — Progress Notes (Signed)
Subjective: No new complaints regarding his left hip, but the patient does note some discomfort in the right anterior thigh region and finds some difficulty in lifting his right leg.   Objective: Vital signs in last 24 hours: Temp:  [97.6 F (36.4 C)-98.3 F (36.8 C)] 98.3 F (36.8 C) (06/23 1516) Pulse Rate:  [55-74] 58 (06/23 1516) Resp:  [17-20] 20 (06/23 1516) BP: (96-131)/(48-73) 103/48 mmHg (06/23 1516) SpO2:  [96 %-100 %] 98 % (06/23 1516)  Intake/Output from previous day: 06/22 0701 - 06/23 0700 In: 360 [P.O.:360] Out: 750 [Urine:750] Intake/Output this shift:     Recent Labs  11/03/15 1200 11/04/15 0545  HGB 12.1* 11.3*    Recent Labs  11/03/15 1200 11/04/15 0545  WBC 6.2 4.9  RBC 3.54* 3.48*  HCT 36.7* 33.4*  PLT 101* 96*    Recent Labs  11/03/15 1200 11/04/15 0545  NA 135 136  K 4.0 4.1  CL 101 102  CO2 26 26  BUN 35* 31*  CREATININE 1.38* 1.19  GLUCOSE 105* 106*  CALCIUM 9.2 8.7*    Recent Labs  11/04/15 1453  INR 1.40    Physical Exam: Examination of his right hip demonstrates full pain-free passive range of motion of the hip. Actively, he can flex the hip beyond 100 and internally and externally rotate the hip with little discomfort. He notes only some mild discomfort when he tries to perform an active straight leg raise using gravity as resistance. He is neurovascular intact to the right lower extremity and foot.  Examination of the left hip demonstrates only mild discomfort with active flexion of the hip. He is able to tolerate internal and external rotation to 20 and 50 respectively with only mild discomfort. He is neurovascularly intact to the left lower extremity and foot.  X-rays: An MRI scan of the left hip was performed earlier today. By report, the scan demonstrates no evidence for any occult fracture of the femoral neck, the intertrochanteric region, or any other area around the left hip. In addition, the lytic lesion noted  on CAT scan appears to be only a benign intraosseous lipoma (according to the radiologist) with no surrounding bone marrow reactive changes. Incidental note was made moderate to severe degenerative changes of the lower lumbar spine, primarily at L4-5.  Assessment: Left hip pain of unclear etiology, most likely being referred from the lower back.  Plan: Based on the benign appearance of the MRI scan of the left hip, I do not feel that surgical intervention is either necessary or warranted at this time. Therefore, I feel that the patient can begin being mobilized with physical therapy while receiving appropriate pain management, weightbearing as tolerated. Most likely, he will need rehabilitation placement until he regains his mobility. These findings and plan were discussed with the patient, with the patient's son by phone, and with Dr. Verdell Carmine.  Thank you for asking me to participate in the care of this man. I will sign off at this time, but be happy to see the patient in follow-up in my office in 1 month if necessary.  Andres Gutierrez 11/04/2015, 6:05 PM

## 2015-11-04 NOTE — Progress Notes (Signed)
Received a call from [place] at [facility] that pt's heart rate is ranging from 37-39bpm. A-fib rhythm on the monitor noted. Pt is a known case of chronic A-fib on Eliquis. Pt checked, sitting comfortably on bed watching TV. No complains as of this time, denies any chest pain. Will continue to monitor.

## 2015-11-04 NOTE — Progress Notes (Signed)
Subjective: No new complaints. Pain well-controlled. Patient is scheduled for MRI scan this morning.   Objective: Vital signs in last 24 hours: Temp:  [97.6 F (36.4 C)-98.3 F (36.8 C)] 98.1 F (36.7 C) (06/23 0756) Pulse Rate:  [55-76] 55 (06/23 0756) Resp:  [17-20] 18 (06/23 0756) BP: (96-131)/(51-73) 99/51 mmHg (06/23 0756) SpO2:  [96 %-100 %] 96 % (06/23 0756) Weight:  [88.451 kg (195 lb)] 88.451 kg (195 lb) (06/22 1155)  Intake/Output from previous day: 06/22 0701 - 06/23 0700 In: 360 [P.O.:360] Out: 750 [Urine:750] Intake/Output this shift:     Recent Labs  11/03/15 1200 11/04/15 0545  HGB 12.1* 11.3*    Recent Labs  11/03/15 1200 11/04/15 0545  WBC 6.2 4.9  RBC 3.54* 3.48*  HCT 36.7* 33.4*  PLT 101* 96*    Recent Labs  11/03/15 1200 11/04/15 0545  NA 135 136  K 4.0 4.1  CL 101 102  CO2 26 26  BUN 35* 31*  CREATININE 1.38* 1.19  GLUCOSE 105* 106*  CALCIUM 9.2 8.7*   No results for input(s): LABPT, INR in the last 72 hours.  Physical Exam: Unchanged versus yesterday. Patient is neurovascularly intact to the left lower extremity and foot.  Assessment: Left hip stress/occult fracture with probable pathologic component.  Plan: The patient is scheduled to undergo an MRI scan of the left hip this morning. He also is scheduled to be evaluated by cardiology today. If the patient can be cleared by cardiology later today, then he might be ready to be taken to surgery either tomorrow or Sunday for a trochanteric femoral nailing of the left hip.    Andres Gutierrez 11/04/2015, 8:09 AM

## 2015-11-04 NOTE — NC FL2 (Signed)
[place] LEVEL OF CARE SCREENING TOOL     IDENTIFICATION  Patient Name: Andres Gutierrez Birthdate: Aug 29, 1930 Sex: male Admission Date (Current Location): 11/03/2015  [place] and [place] Number:  Engineering geologist and Address:  [facility], [address], [place], [place] [address]      Provider Number: [id number]  Attending Physician Name and Address:  Henreitta Leber, MD  Relative Name and Phone Number:       Current Level of Care: Hospital Recommended Level of Care: [place] Prior Approval Number:    Date Approved/Denied:   PASRR Number:  ([id number])  Discharge Plan: SNF    Current Diagnoses: Patient Active Problem List   Diagnosis Date Noted  . Femoral neck fracture, left, closed, initial encounter 11/03/2015  . Chronic atrial fibrillation ([place])   . Coronary artery disease involving native coronary artery of native heart without angina pectoris   . Acalculous cholecystitis 10/29/2015  . Adenopathy   . CAD (coronary artery disease) 12/22/2014  . HTN (hypertension) 12/22/2014    Orientation RESPIRATION BLADDER Height & Weight     Self, Time, Situation, Place  Normal Continent Weight: 195 lb (88.451 kg) Height:  6\' 4"  (193 cm)  BEHAVIORAL SYMPTOMS/MOOD NEUROLOGICAL BOWEL NUTRITION STATUS   (none )  (none ) Continent Diet (Diet: Heart Healthy )  AMBULATORY STATUS COMMUNICATION OF NEEDS Skin   Extensive Assist Verbally Surgical wounds                       Personal Care Assistance Level of Assistance  Bathing, Feeding, Dressing Bathing Assistance: Limited assistance Feeding assistance: Independent Dressing Assistance: Limited assistance     Functional Limitations Info  Sight, Hearing, Speech Sight Info: Adequate Hearing Info: Impaired Speech Info: Adequate    SPECIAL CARE FACTORS FREQUENCY  PT (By licensed PT), OT (By licensed OT)     PT Frequency:  (5) OT Frequency:  (5)             Contractures      Additional Factors Info  Code Status, Allergies Code Status Info:  (Full Code. ) Allergies Info:  (Diphenhydramine Hcl, Escitalopram, Maxidex, Prednisone, Serotonin, Vytorin, Zocor)           Current Medications (11/04/2015):  This is the current hospital active medication list Current Facility-Administered Medications  Medication Dose Route Frequency Provider Last Rate Last Dose  . acetaminophen (TYLENOL) tablet 650 mg  650 mg Oral Q6H PRN Lytle Butte, MD       Or  . acetaminophen (TYLENOL) suppository 650 mg  650 mg Rectal Q6H PRN Lytle Butte, MD      . atorvastatin (LIPITOR) tablet 20 mg  20 mg Oral QHS Lytle Butte, MD   20 mg at 11/03/15 2101  . digoxin (LANOXIN) tablet 0.125 mg  0.125 mg Oral Daily Lytle Butte, MD   0.125 mg at 11/04/15 [id number]  . docusate sodium (COLACE) capsule 100 mg  100 mg Oral BID Lytle Butte, MD   100 mg at 11/04/15 [id number]  . ferrous sulfate tablet 325 mg  325 mg Oral Daily Lytle Butte, MD   325 mg at 11/04/15 [id number]  . furosemide (LASIX) tablet 40 mg  40 mg Oral Once per day on Mon Thu Lytle Butte, MD   40 mg at 11/03/15 2050  . heparin ADULT infusion 100 units/mL (25000 units/241mL sodium chloride 0.45%)  1,050 Units/hr Intravenous Continuous [facility]  Shelda Jakes, RPH      . metoprolol (LOPRESSOR) tablet 50 mg  50 mg Oral BID Lytle Butte, MD   Stopped at 11/04/15 [phone]  . morphine 2 MG/ML injection 2 mg  2 mg Intravenous Q4H PRN Lytle Butte, MD      . nitroGLYCERIN (NITROSTAT) SL tablet 0.4 mg  0.4 mg Sublingual Q5 min PRN Lytle Butte, MD      . ondansetron [facility]) tablet 4 mg  4 mg Oral Q6H PRN Lytle Butte, MD       Or  . ondansetron [facility]) injection 4 mg  4 mg Intravenous Q6H PRN Lytle Butte, MD      . oxyCODONE (Oxy IR/ROXICODONE) immediate release tablet 5 mg  5 mg Oral Q4H PRN Lytle Butte, MD      . pantoprazole (PROTONIX) EC tablet 40 mg  40 mg Oral BID AC Lytle Butte, MD   40 mg at 11/04/15 [id number]  .  polyethylene glycol (MIRALAX / GLYCOLAX) packet 17 g  17 g Oral Daily PRN Lytle Butte, MD      . potassium chloride (K-DUR,KLOR-CON) CR tablet 10 mEq  10 mEq Oral BID Lytle Butte, MD   10 mEq at 11/04/15 [id number]  . senna (SENOKOT) tablet 8.6 mg  1 tablet Oral QHS PRN Lytle Butte, MD      . sucralfate (CARAFATE) tablet 1 g  1 g Oral TID WC & HS Lytle Butte, MD   1 g at 11/04/15 1315  . tamsulosin (FLOMAX) capsule 0.4 mg  0.4 mg Oral QPC supper Lytle Butte, MD   0.4 mg at 11/03/15 2050  . vitamin C (ASCORBIC ACID) tablet 250 mg  250 mg Oral Daily Lytle Butte, MD   250 mg at 11/04/15 [id number]     Discharge Medications: Please see discharge summary for a list of discharge medications.  Relevant Imaging Results:  Relevant Lab Results:   Additional Information  (SSN: [id number])  Loralyn Freshwater, LCSW

## 2015-11-04 NOTE — Care Management Note (Signed)
Case Management Note  Patient Details  Name: Andres Gutierrez MRN: [id number] Date of Birth: [birth year]  Subjective/Objective:                  Met with patient to discuss discharge planning. He is pending surgery to fix fractured hip. He has no DME at home. He agrees to SNF [facility] or [facility]) if needed. His PCP is Dr. Emily Filbert. He lives alone. Has supportive neighbors and "daughter lives hollering distance from him" . He uses [facility] for Rx.   Action/Plan: List of home health agencies left with patient. RNCM will continue to follow CSW updated.   Expected Discharge Date:                  Expected Discharge Plan:     In-House Referral:     Discharge planning Services  CM Consult  Post Acute Care Choice:  Home Health, Durable Medical Equipment Choice offered to:  Patient  DME Arranged:    DME Agency:     HH Arranged:    [place] Agency:     Status of Service:  In process, will continue to follow  If discussed at Long Length of Stay Meetings, dates discussed:    Additional Comments:  [name], RN 11/04/2015, 12:01 PM

## 2015-11-04 NOTE — Progress Notes (Signed)
MRI results reviewed with pt and family. Pt does not need surgery at this time per Dr. Roland Rack. Paged hospitalist with update, orders cancelled for heparin, new order to resume Eliquis 5mg  BID.

## 2015-11-04 NOTE — Consult Note (Signed)
[facility] Cardiology  CARDIOLOGY CONSULT NOTE  Patient ID: Andres Gutierrez MRN: [id number] DOB/AGE: [birth year] 80 y.o.  Admit date: 11/03/2015 Referring Physician [facility] Primary Physician [facility] Primary Cardiologist Fath Reason for Consultation preoperative cardiovascular assessment  HPI: 82 year old gentleman referred for preoperative cardiovascular assessment. The patient presents with acute left hip pain, noted to have a fracture, awaiting surgery. The patient has known coronary artery disease, status post CABG in 1984 at [facility]. He is followed by Dr. Ubaldo Glassing. He reports occasional mild episodes of chest discomfort, which is chronic and unchanged. He has known cardiomyopathy with LVEF of 35%, with chronic exertional dyspnea which is unchanged. Patient also has a history of atrial fibrillation/atrial flutter, EKG revealing atrial flutter with a controlled ventricular rate, on Ella request for stroke prevention which has been held. The patient denies palpitations or heart racing. He denies peripheral edema.  Review of systems complete and found to be negative unless listed above     Past Medical History  Diagnosis Date  . Hypertension   . MI (myocardial infarction) ([place])     x 2  . Coronary artery disease   . Dysrhythmia   . Cardiomyopathy ([place])   . Lymphadenopathy, hilar   . Wheezing   . Cough   . Anemia   . Anxiety   . Chronic kidney disease     kidney stones    Past Surgical History  Procedure Laterality Date  . Coronary artery bypass graft    . Coronary angioplasty with stent placement    . Endobronchial ultrasound N/A 12/31/2014    Procedure: ENDOBRONCHIAL ULTRASOUND;  Surgeon: Flora Lipps, MD;  Location: [facility] ORS;  Service: Cardiopulmonary;  Laterality: N/A;  . Bronchial needle aspiration biopsy N/A 12/31/2014    Procedure: BRONCHIAL NEEDLE ASPIRATION BIOPSIES from carina;  Surgeon: Flora Lipps, MD;  Location: [facility] ORS;  Service: Cardiopulmonary;  Laterality: N/A;  .  Esophagogastroduodenoscopy (egd) with propofol N/A 06/20/2015    Procedure: ESOPHAGOGASTRODUODENOSCOPY (EGD) WITH PROPOFOL;  Surgeon: Lollie Sails, MD;  Location: [facility] ENDOSCOPY;  Service: Endoscopy;  Laterality: N/A;    Prescriptions prior to admission  Medication Sig Dispense Refill Last Dose  . apixaban (ELIQUIS) 5 MG TABS tablet Take 1 tablet (5 mg total) by mouth 2 (two) times daily. 60 tablet 11 11/03/2015 at 0730  . aspirin EC 81 MG tablet Take 81 mg by mouth daily.   11/03/2015 at Unknown time  . atorvastatin (LIPITOR) 20 MG tablet Take 20 mg by mouth at bedtime.    11/02/2015 at Unknown time  . digoxin (LANOXIN) 0.125 MG tablet Take 1 tablet (0.125 mg total) by mouth daily. 30 tablet 0 11/03/2015 at Unknown time  . ferrous sulfate 325 (65 FE) MG tablet Take 1 tablet (325 mg total) by mouth daily. 30 tablet 3 11/03/2015 at Unknown time  . furosemide (LASIX) 40 MG tablet Take 40 mg by mouth 2 (two) times a week. Pt takes on Monday and Friday.   10/24/2015 at unknown  . metoprolol (LOPRESSOR) 50 MG tablet Take 1 tablet (50 mg total) by mouth 2 (two) times daily. 60 tablet 0 11/03/2015 at 0730  . nitroGLYCERIN (NITROSTAT) 0.4 MG SL tablet Place 0.4 mg under the tongue every 5 (five) minutes as needed for chest pain.   unknown at unknown  . pantoprazole (PROTONIX) 40 MG tablet Take 40 mg by mouth 2 (two) times daily before a meal.    11/03/2015 at Unknown time  . potassium chloride (K-DUR) 10 MEQ tablet Take 10 mEq  by mouth 2 (two) times daily.   11/03/2015 at Unknown time  . senna (SENOKOT) 8.6 MG tablet Take 1 tablet by mouth at bedtime as needed for constipation.   Past Month at Unknown time  . sucralfate (CARAFATE) 1 G tablet Take 1 g by mouth 4 (four) times daily -  with meals and at bedtime.    11/03/2015 at Unknown time  . tamsulosin (FLOMAX) 0.4 MG CAPS capsule Take 0.4 mg by mouth daily after supper.    11/02/2015 at Unknown time  . traMADol (ULTRAM) 50 MG tablet Take 1 tablet (50 mg total) by  mouth every 6 (six) hours as needed. 12 tablet 0   . vitamin C (VITAMIN C) 250 MG tablet Take 1 tablet (250 mg total) by mouth daily. 30 tablet 0    Social History   Social History  . Marital Status: Widowed    Spouse Name: N/A  . Number of Children: N/A  . Years of Education: N/A   Occupational History  . Not on file.   Social History Main Topics  . Smoking status: Former Smoker    Quit date: 06/27/1977  . Smokeless tobacco: Current User    Types: Chew  . Alcohol Use: Yes     Comment: occational almost rare once a year  . Drug Use: No  . Sexual Activity: Not on file   Other Topics Concern  . Not on file   Social History Narrative    Family History  Problem Relation Age of Onset  . CAD Mother   . Colon cancer Father       Review of systems complete and found to be negative unless listed above      PHYSICAL EXAM  General: Well developed, well nourished, in no acute distress HEENT:  Normocephalic and atramatic Neck:  No JVD.  Lungs: Clear bilaterally to auscultation and percussion. Heart: HRRR . Normal S1 and S2 without gallops or murmurs.  Abdomen: Bowel sounds are positive, abdomen soft and non-tender  Msk:  Back normal, normal gait. Normal strength and tone for age. Extremities: No clubbing, cyanosis or edema.   Neuro: Alert and oriented X 3. Psych:  Good affect, responds appropriately  Labs:   Lab Results  Component Value Date   WBC 4.9 11/04/2015   HGB 11.3* 11/04/2015   HCT 33.4* 11/04/2015   MCV 95.9 11/04/2015   PLT 96* 11/04/2015    Recent Labs Lab 10/28/15 1900  11/04/15 0545  NA 137  < > 136  K 4.4  < > 4.1  CL 106  < > 102  CO2 19*  < > 26  BUN 32*  < > 31*  CREATININE 1.56*  < > 1.19  CALCIUM 9.6  < > 8.7*  PROT 7.7  --   --   BILITOT 1.8*  --   --   ALKPHOS 57  --   --   ALT 23  --   --   AST 24  --   --   GLUCOSE 115*  < > 106*  < > = values in this interval not displayed. Lab Results  Component Value Date   CKTOTAL 139  09/11/2010   CKMB 1.8 09/11/2010   TROPONINI 0.03 10/29/2015    Lab Results  Component Value Date   CHOL  09/11/2010    84        ATP III CLASSIFICATION:  <200     mg/dL   Desirable  200-239  mg/dL  Borderline High  >=240    mg/dL   High          Lab Results  Component Value Date   HDL 15* 09/11/2010   Lab Results  Component Value Date   [facility]  09/11/2010    53        Total Cholesterol/HDL:CHD Risk Coronary Heart Disease Risk Table                     Men   Women  1/2 Average Risk   3.4   3.3  Average Risk       5.0   4.4  2 X Average Risk   9.6   7.1  3 X Average Risk  23.4   11.0        Use the calculated Patient Ratio above and the CHD Risk Table to determine the patient's CHD Risk.        ATP III CLASSIFICATION (LDL):  <100     mg/dL   Optimal  100-129  mg/dL   Near or Above                    Optimal  130-159  mg/dL   Borderline  160-189  mg/dL   High  >190     mg/dL   Very High   Lab Results  Component Value Date   TRIG 78 09/11/2010   Lab Results  Component Value Date   CHOLHDL 5.6 09/11/2010   No results found for: LDLDIRECT    Radiology: Ct Head Wo Contrast  11/03/2015  CLINICAL DATA:  Hip pain EXAM: CT HEAD WITHOUT CONTRAST TECHNIQUE: Contiguous axial images were obtained from the base of the skull through the vertex without intravenous contrast. COMPARISON:  None FINDINGS: Prominence of the sulci and ventricles identified compatible with brain atrophy. No evidence for acute brain infarct, acute intracranial hemorrhage or mass. Mild low attenuation within the subcortical and periventricular white matter is noted consistent with chronic microvascular disease. The paranasal sinuses and mastoid air cells are clear. The calvarium is intact. IMPRESSION: 1. Chronic microvascular disease and brain atrophy. 2. No acute findings. Electronically Signed   By: Kerby Moors M.D.   On: 11/03/2015 15:54   Ct Angio Chest Pe W/cm &/or Wo Cm  10/28/2015  CLINICAL  DATA:  Intermittent chest pain the past couple months left-sided and and abdomen. Some shortness-of-breath. EXAM: CT ANGIOGRAPHY CHEST CT ABDOMEN AND PELVIS WITH CONTRAST TECHNIQUE: Multidetector CT imaging of the chest was performed using the standard protocol during bolus administration of intravenous contrast. Multiplanar CT image reconstructions and MIPs were obtained to evaluate the vascular anatomy. Multidetector CT imaging of the abdomen and pelvis was performed using the standard protocol during bolus administration of intravenous contrast. CONTRAST:  75 mL Isovue 370 IV COMPARISON:  None. CT chest, abdomen and pelvis 05/04/2015 and chest CT 12/21/2014. Abdominal/pelvic CT 08/01/2009 FINDINGS: CTA CHEST FINDINGS Lungs are adequately inflated demonstrate interval development of a moderate size right pleural effusion and small left pleural effusion. There is patchy hazy airspace opacification over the left lower lobe which is new. Again noted is a 2.9 cm cavitary lesion over the left apex as the wall is slightly thicker compared to the prior exams although size unchanged. No intracavitary solid component. Airways are within normal. Mild cardiomegaly. Evidence of previous median sternotomy. There is mild prominence of the pulmonary arteries which may reflect a degree of pulmonary arterial hypertension. No evidence of pulmonary emboli. There  is mild worsening bilateral hilar and mediastinal adenopathy. The right hilar lymph node measures 1.6 cm, subcarinal lymph node 1.4 cm and prevascular lymph node 1.3 cm by short axis. Calcified plaque over the thoracic aorta. Remaining mediastinal structures are unremarkable. A few small right pericardial phrenic lymph nodes. CT ABDOMEN and PELVIS FINDINGS Abdominal images demonstrate mild gallbladder wall thickening with ill definition of the adjacent fat which could be seen in acute cholecystitis. Mild fluid adjacent the duodenal C-sweep. The liver, spleen, pancreas and  adrenal glands are within normal. Kidneys are normal in size with several bilateral stable cysts and mild bilateral nephrolithiasis. No evidence of hydronephrosis. Ureters are normal. There is calcified plaque over the abdominal aorta. Stomach is within normal. Small bowel is unremarkable. Appendix is not visualized. Surgical clips are present over the region of the cecum/ascending colon. Pelvic images demonstrate a small amount of free fluid over the midline. The bladder is decompressed. Prostate and rectum are normal. There are degenerative changes throughout the spine. Multilevel disc disease over the lumbar spine. Grade 1 anterolisthesis of L4 on L5. Minimal degenerate change of the hips. 2 cm oval lytic lesion over the left femoral neck unchanged from 2011. Review of the MIP images confirms the above findings. IMPRESSION: Interval development of moderate size right effusion and small left effusion with hazy airspace process over the left lower lobe/ lingular as findings may be due to infection. Worsening bilateral hilar and mediastinal adenopathy as described. Cavity lesion over the left apex unchanged in size although slight interval wall thickening without intracavitary solid component. Cannot exclude an infectious process such as chronic TB or fungal disease. Neoplasm would be less likely. No evidence of pulmonary embolism. Gallbladder wall thickening with suggestion of adjacent inflammatory change as findings may be due to acute cholecystitis. Minimal adjacent free fluid and minimal fluid over the upper pelvis. Recommend clinical correlation and right upper quadrant ultrasound if indicated. Bilateral renal cysts and nephrolithiasis unchanged. Mild cardiomegaly. Electronically Signed   By: Marin Olp M.D.   On: 10/28/2015 22:03   Mr Hip Left W Wo Contrast  11/04/2015  CLINICAL DATA:  Acute onset of left hip pain 2-3 days ago. Abnormal CT scan on 11/03/2015. EXAM: MRI OF THE LEFT HIP WITHOUT AND WITH  CONTRAST TECHNIQUE: Multiplanar, multisequence MR imaging was performed both before and after administration of intravenous contrast. CONTRAST:  64mL MULTIHANCE GADOBENATE DIMEGLUMINE 529 MG/ML IV SOLN COMPARISON:  CT scans dated 11/03/2015 and 09/10/2010 FINDINGS: Bones: There is a benign 2 cm intraosseous lipoma in the left femoral neck. There is no fracture or bone destruction. Minimal degenerative changes of both femoral heads. Severe facet arthritis at L4-5 bilaterally with spondylolisthesis and severe spinal stenosis. This is chronic as compared to prior CT scans. Articular cartilage and labrum Articular cartilage: Uniform thinning of the articular cartilage of both hips. Labrum:  Intact. Joint or bursal effusion Joint effusion:  Small left hip effusion.  This is nonspecific. Bursae:  Normal. Muscles and tendons Muscles and tendons: Small intramuscular lipoma of the left adductor magnus muscle. Otherwise normal. Other findings Miscellaneous:  No adenopathy or mass lesions. IMPRESSION: 1. No evidence of fracture or stress injury of the proximal left femur. 2. Small nonspecific left hip effusion suggesting synovitis. 3. Benign intraosseous lipoma of the left femoral neck. 4. Severe chronic spinal stenosis at L4-5. Electronically Signed   By: Lorriane Shire M.D.   On: 11/04/2015 11:08   Ct Abdomen Pelvis W Contrast  10/28/2015  CLINICAL DATA:  Intermittent chest pain the past couple months left-sided and and abdomen. Some shortness-of-breath. EXAM: CT ANGIOGRAPHY CHEST CT ABDOMEN AND PELVIS WITH CONTRAST TECHNIQUE: Multidetector CT imaging of the chest was performed using the standard protocol during bolus administration of intravenous contrast. Multiplanar CT image reconstructions and MIPs were obtained to evaluate the vascular anatomy. Multidetector CT imaging of the abdomen and pelvis was performed using the standard protocol during bolus administration of intravenous contrast. CONTRAST:  75 mL Isovue 370  IV COMPARISON:  None. CT chest, abdomen and pelvis 05/04/2015 and chest CT 12/21/2014. Abdominal/pelvic CT 08/01/2009 FINDINGS: CTA CHEST FINDINGS Lungs are adequately inflated demonstrate interval development of a moderate size right pleural effusion and small left pleural effusion. There is patchy hazy airspace opacification over the left lower lobe which is new. Again noted is a 2.9 cm cavitary lesion over the left apex as the wall is slightly thicker compared to the prior exams although size unchanged. No intracavitary solid component. Airways are within normal. Mild cardiomegaly. Evidence of previous median sternotomy. There is mild prominence of the pulmonary arteries which may reflect a degree of pulmonary arterial hypertension. No evidence of pulmonary emboli. There is mild worsening bilateral hilar and mediastinal adenopathy. The right hilar lymph node measures 1.6 cm, subcarinal lymph node 1.4 cm and prevascular lymph node 1.3 cm by short axis. Calcified plaque over the thoracic aorta. Remaining mediastinal structures are unremarkable. A few small right pericardial phrenic lymph nodes. CT ABDOMEN and PELVIS FINDINGS Abdominal images demonstrate mild gallbladder wall thickening with ill definition of the adjacent fat which could be seen in acute cholecystitis. Mild fluid adjacent the duodenal C-sweep. The liver, spleen, pancreas and adrenal glands are within normal. Kidneys are normal in size with several bilateral stable cysts and mild bilateral nephrolithiasis. No evidence of hydronephrosis. Ureters are normal. There is calcified plaque over the abdominal aorta. Stomach is within normal. Small bowel is unremarkable. Appendix is not visualized. Surgical clips are present over the region of the cecum/ascending colon. Pelvic images demonstrate a small amount of free fluid over the midline. The bladder is decompressed. Prostate and rectum are normal. There are degenerative changes throughout the spine.  Multilevel disc disease over the lumbar spine. Grade 1 anterolisthesis of L4 on L5. Minimal degenerate change of the hips. 2 cm oval lytic lesion over the left femoral neck unchanged from 2011. Review of the MIP images confirms the above findings. IMPRESSION: Interval development of moderate size right effusion and small left effusion with hazy airspace process over the left lower lobe/ lingular as findings may be due to infection. Worsening bilateral hilar and mediastinal adenopathy as described. Cavity lesion over the left apex unchanged in size although slight interval wall thickening without intracavitary solid component. Cannot exclude an infectious process such as chronic TB or fungal disease. Neoplasm would be less likely. No evidence of pulmonary embolism. Gallbladder wall thickening with suggestion of adjacent inflammatory change as findings may be due to acute cholecystitis. Minimal adjacent free fluid and minimal fluid over the upper pelvis. Recommend clinical correlation and right upper quadrant ultrasound if indicated. Bilateral renal cysts and nephrolithiasis unchanged. Mild cardiomegaly. Electronically Signed   By: Marin Olp M.D.   On: 10/28/2015 22:03   Ct Hip Left Wo Contrast  11/03/2015  CLINICAL DATA:  Hip pain starting today, worsening. Left leg weakness. Cortisone injection today. EXAM: CT OF THE LEFT HIP WITHOUT CONTRAST TECHNIQUE: Multidetector CT imaging of the left hip was performed according to the standard protocol. Multiplanar CT  image reconstructions were also generated. COMPARISON:  11/02/2015 FINDINGS: 2.4 by 1.6 by 1.6 cm lucent lesion in the left femoral neck, internal density approximately -30 Hounsfield units, fairly similar size back in 2012. Mild chondrocalcinosis along the acetabular labrum. Minimal spurring of the acetabulum and femoral head. There appears to be some subtle periosteal reaction along the lateral margin left femoral neck extending to the greater  trochanter, not readily seen on the prior CT scan of 10/28/2015. I do not perceive a definite hip joint effusion. No definite regional bursitis. Small lipoma within the left adductor magnus, image 81/4. Iliac and femoral atherosclerotic calcification. IMPRESSION: 1. New abnormal periosteal reaction along the upper margin of the left femoral neck and greater trochanter, raising suspicion for an underlying stress fracture or otherwise occult injury along the femoral neck or greater trochanter region. There is enough spurring in the region that a subtle nondisplaced fracture could conceivably be occult. MRI could help in further workup if clinically warranted. 2. Low-density lucent lesion centrally along the base of the left femoral neck, not appreciably changed from 2012, hence likely benign. This does not have a sclerotic margin and could represent a small fatty lesion given the low-density. 3. Chondrocalcinosis along the left acetabular labrum, raising the possibility of CPPD arthropathy. 4. Small lipoma along the left adductor magnus. 5. Atherosclerosis. Electronically Signed   By: Van Clines M.D.   On: 11/03/2015 16:10   Nm Hepato W/eject Fract  10/31/2015  CLINICAL DATA:  Acalculous cholecystitis. Previous normal nuclear medicine study 10/07/2015 EXAM: NUCLEAR MEDICINE HEPATOBILIARY IMAGING WITH GALLBLADDER EF TECHNIQUE: Sequential images of the abdomen were obtained out to 60 minutes following intravenous administration of radiopharmaceutical. After oral ingestion of Ensure, gallbladder ejection fraction was determined. At 60 min, normal ejection fraction is greater than 33%. RADIOPHARMACEUTICALS:  5.433 mCi Tc-29m  Choletec IV COMPARISON:  Ultrasound 10/28/2015. Nuclear medicine study 10/07/2015. FINDINGS: Prompt uptake and biliary excretion of activity by the liver is seen. Gallbladder activity is visualized, consistent with patency of cystic duct. Biliary activity passes into small bowel,  consistent with patent common bile duct. Calculated gallbladder ejection fraction is 42%. (Normal gallbladder ejection fraction with Ensure is greater than 33%.) this is less than was documented 1 month ago, where ejection fraction of 83% occurred. IMPRESSION: Cystic duct patent. Gallbladder ejection fraction 42%. Ejection fraction of 83% seen 1 month ago. Patient may have gallbladder edema related to increased right heart pressures, which could be associated with a decrease in the ejection fraction. Electronically Signed   By: Nelson Chimes M.D.   On: 10/31/2015 14:23   Nm Hepato W/eject Fract  10/07/2015  CLINICAL DATA:  Chronic right upper quadrant abdominal pain. EXAM: NUCLEAR MEDICINE HEPATOBILIARY IMAGING WITH GALLBLADDER EF TECHNIQUE: Sequential images of the abdomen were obtained out to 60 minutes following intravenous administration of radiopharmaceutical. After oral ingestion of Ensure, gallbladder ejection fraction was determined. At 60 min, normal ejection fraction is greater than 33%. RADIOPHARMACEUTICALS:  5.24 mCi Tc-54m  Choletec IV COMPARISON:  Ultrasound of Sep 30, 2015. FINDINGS: Prompt uptake and biliary excretion of activity by the liver is seen. Gallbladder activity is visualized, consistent with patency of cystic duct. Biliary activity passes into small bowel, consistent with patent common bile duct. Calculated gallbladder ejection fraction is 83%. (Normal gallbladder ejection fraction with Ensure is greater than 33%.) IMPRESSION: Normal gallbladder ejection fraction is noted. Normal and timely filling of gallbladder is noted. Electronically Signed   By: Marijo Conception, M.D.   On:  10/07/2015 15:03   Dg Chest Portable 1 View  10/28/2015  CLINICAL DATA:  Dyspnea. Intermittent chest pain for months. Left-sided chest and abdominal pain. Shortness of breath. EXAM: PORTABLE CHEST 1 VIEW COMPARISON:  Chest CT 05/04/2015. Radiographs and 07/12/2015 and 02/01/2015 reports, images not available  for review. FINDINGS: Patient is post median sternotomy. The heart is enlarged. Small pleural effusions and bibasilar opacities, favor atelectasis. Mild vascular congestion without alveolar edema. No pneumothorax left apical bleb on prior CT not well seen radiographically. Remote left rib fracture. IMPRESSION: 1. Cardiomegaly. Small pleural effusions and probable bibasilar atelectasis. 2. Mild vascular congestion, no alveolar edema. 3. Overall findings may reflect fluid overload. Electronically Signed   By: Jeb Levering M.D.   On: 10/28/2015 19:38   Dg Hip Unilat With Pelvis 2-3 Views Left  11/02/2015  CLINICAL DATA:  Acute onset of left-sided hip pain. Initial encounter. EXAM: DG HIP (WITH OR WITHOUT PELVIS) 2-3V LEFT COMPARISON:  None. FINDINGS: There is no evidence of fracture or dislocation. Both femoral heads are seated normally within their respective acetabula. The proximal left femur appears intact. No significant degenerative change is appreciated. The sacroiliac joints are unremarkable in appearance. The visualized bowel gas pattern is grossly unremarkable in appearance. Contrast is seen within the rectum. Postoperative change is noted at the right lower quadrant. Scattered phleboliths are noted within the pelvis. IMPRESSION: No evidence of fracture or dislocation. Electronically Signed   By: Garald Balding M.D.   On: 11/02/2015 21:00   US Abdomen Limited Ruq  10/29/2015  CLINICAL DATA:  Upper abdominal pain for years, shortness of breath. Abnormal gallbladder on CT. EXAM: US ABDOMEN LIMITED - RIGHT UPPER QUADRANT COMPARISON:  CT earlier this day. FINDINGS: Gallbladder: Physiologically distended. No gallstones. Diffuse gallbladder wall thickening measuring 5-6 mm. Trace pericholecystic fluid. No sonographic Murphy sign noted by sonographer. Common bile duct: Diameter: 2 mm. Liver: No focal lesion identified. Within normal limits in parenchymal echogenicity. Normal directional flow in the main  portal vein. Incidental: Right pleural effusion.  Right renal cysts. IMPRESSION: Diffuse gallbladder wall thickening with trace pericholecystic fluid. No gallstones. Findings may reflect acalculus cholecystitis, acute or chronic versus sequela of liver disease. No biliary dilatation. Electronically Signed   By: Jeb Levering M.D.   On: 10/29/2015 00:10    EKG: Atrial flutter with a controlled ventricular rate  ASSESSMENT AND PLAN:   1. Known CAD, status post CABG, with ischemic cardiomyopathy, with intermittent episodes of mild chronic chest discomfort, with chronic exertional dyspnea, overall appears clinically stable. 2. Chronic atrial fibrillation/atrial flutter, with controlled ventricular rate, on Ella request for stroke prevention 3. Left hip fracture, awaiting surgery  Recommendations  1. Agree with overall current therapy 2. Hold Eliquis 2-3 days prior to surgery, resume as soon as possible after surgery 3. Continue metoprolol pre-,peri- and post-operatively 4. Defer further cardiac diagnostics at this time 5. Proceed with surgery as planned  Signed: Demetris Meinhardt MD,PhD, [facility] 11/04/2015, 1:01 PM

## 2015-11-04 NOTE — Progress Notes (Addendum)
ANTICOAGULATION CONSULT NOTE - Initial Consult  Pharmacy Consult for heparin drip  Indication: atrial fibrillation, bridge to surgery  Allergies  Allergen Reactions  . Diphenhydramine Hcl Other (See Comments)    Reaction:  Rash and fever a long time ago, but has had it since with no problem.  . Escitalopram Other (See Comments)    Reaction:  Makes him feel faint, like he was going to have a heart attack.  . Maxidex [Dexamethasone] Other (See Comments)    Reaction:  Unknown   . Prednisone Other (See Comments)    Pt states that this medication made him feel crazy.    . Serotonin Other (See Comments)    Tried 2 different types, lexapro and another one.  Reaction:  Made him feel like he was having a heart attack.   . Vytorin [Ezetimibe-Simvastatin] Other (See Comments)    Reaction:  Unknown   . Zocor [Simvastatin] Other (See Comments)    Reaction:  Unknown     Patient Measurements: Height: 6\' 4"  (193 cm) Weight: 195 lb (88.451 kg) IBW/kg (Calculated) : 86.8 Heparin Dosing Weight: 88 kg  Vital Signs: Temp: 97.9 F (36.6 C) (06/23 1138) Temp Source: Oral (06/23 1138) BP: 96/48 mmHg (06/23 1138) Pulse Rate: 60 (06/23 1138)  Labs:  Recent Labs  11/03/15 1200 11/04/15 0545  HGB 12.1* 11.3*  HCT 36.7* 33.4*  PLT 101* 96*  CREATININE 1.38* 1.19    Estimated Creatinine Clearance: 58.8 mL/min (by C-G formula based on Cr of 1.19).   Medical History: Past Medical History  Diagnosis Date  . Hypertension   . MI (myocardial infarction) ([place])     x 2  . Coronary artery disease   . Dysrhythmia   . Cardiomyopathy ([place])   . Lymphadenopathy, hilar   . Wheezing   . Cough   . Anemia   . Anxiety   . Chronic kidney disease     kidney stones   Medications:  Scheduled:  . atorvastatin  20 mg Oral QHS  . digoxin  0.125 mg Oral Daily  . docusate sodium  100 mg Oral BID  . ferrous sulfate  325 mg Oral Daily  . furosemide  40 mg Oral Once per day on Mon Thu  . metoprolol   50 mg Oral BID  . pantoprazole  40 mg Oral BID AC  . potassium chloride  10 mEq Oral BID  . sucralfate  1 g Oral TID WC & HS  . tamsulosin  0.4 mg Oral QPC supper  . vitamin C  250 mg Oral Daily   Infusions:  . heparin     Assessment: Pharmacy consulted to dose and monitor heparin drip in this 82 year old male who has atrial fibrillation. Patient was taking apixaban prior to admission, which is currently being held for 2-3 days prior to surgery for hip fracture. Last apixaban dose was 6/22 AM.   Heparin drip being started to bridge patient until surgery.  Ordered STAT APTT, protime/INR, and heparin level.  Baseline CBC obtained this morning: Hgb 11.3 Plt 96 (appears to be ~baseline)  Will need to dose off of APTT if baseline heparin level is elevated since patient was on apixaban prior to admission  Goal of Therapy:  Heparin level 0.3-0.7 units/ml Monitor platelets by anticoagulation protocol: Yes   Plan:  No bolus per MD Start heparin infusion at 1050 units/hr Check anti-Xa level & APTT in 8 hours and daily while on heparin Continue to monitor H&H and platelets  Pharmacy will continue to monitor. Thank you for the consult.  Lenis Noon, PharmD Clinical Pharmacist 11/04/2015,3:02 PM

## 2015-11-05 LAB — CBC
HEMATOCRIT: 36.2 % — AB (ref 40.0–52.0)
Hemoglobin: 12.4 g/dL — ABNORMAL LOW (ref 13.0–18.0)
MCH: 33.9 pg (ref 26.0–34.0)
MCHC: 34.2 g/dL (ref 32.0–36.0)
MCV: 99 fL (ref 80.0–100.0)
PLATELETS: 118 10*3/uL — AB (ref 150–440)
RBC: 3.66 MIL/uL — ABNORMAL LOW (ref 4.40–5.90)
RDW: 18.1 % — AB (ref 11.5–14.5)
WBC: 5.3 10*3/uL (ref 3.8–10.6)

## 2015-11-05 LAB — HEPARIN LEVEL (UNFRACTIONATED): HEPARIN UNFRACTIONATED: 1.77 [IU]/mL — AB (ref 0.30–0.70)

## 2015-11-05 LAB — APTT: APTT: 36 s (ref 24–36)

## 2015-11-05 MED ORDER — TRAMADOL HCL 50 MG PO TABS: 50.0000 mg | ORAL_TABLET | Freq: Four times a day (QID) | ORAL | Status: DC | PRN

## 2015-11-05 NOTE — Clinical Social Work Note (Signed)
Patient assessed by PT and MD ready to discharge patient. PT has recommended home with home health. RN CM to arranged. Shela Leff MSW,LCSW [phone]

## 2015-11-05 NOTE — Evaluation (Signed)
Physical Therapy Evaluation Patient Details Name: Andres Gutierrez MRN: [id number] DOB: 1930-12-28 Today's Date: 11/05/2015   History of Present Illness  82 y/o male here with R LE weakness, pain.  Initially there was a suspected femoral neck fracture, MRI reveals severe L4-5 stenosis.  Clinical Impression  Pt did well with PT exam and showed ability to safety perform bed mobility and do in-home ambulation distances.  His family reports that they will be able to regularly check in and help him as he has definite need for using b/l UEs on AD to take weight.  His biggest issue appears to be new onset R ankle DF weakness (likely from spinal stenosis) that fundamentally changes how he must ambulate.  Pt showed good safety and mobility but is reliant on the walker, he will benefit from PT to address R LE weakness and work on ambulation.     Follow Up Recommendations Home health PT;Supervision - Intermittent    Equipment Recommendations  Rolling walker with 5" wheels;Other (comment) (pt is 6'4" )    Recommendations for Other Services       Precautions / Restrictions Precautions Precautions: Fall Restrictions LLE Weight Bearing: Weight bearing as tolerated      Mobility  Bed Mobility Overal bed mobility: Independent                Transfers Overall transfer level: Independent Equipment used: Rolling walker (2 wheeled)             General transfer comment: Pt is heavily reliant on UEs in getting to standing, but ultimately rises w/o assist  Ambulation/Gait Ambulation/Gait assistance: Supervision Ambulation Distance (Feet): 100 Feet Assistive device: Rolling walker (2 wheeled)       General Gait Details: Pt struggled to get into a consistent cadence but was safe and had no LOBs. He reports moderate pain with WBing and was clearly reliant on the walker to take some weight and needed to be deliberate with R LE to clear toes but generally was safe and showed good relative  confidence.   Stairs            Wheelchair Mobility    Modified Rankin (Stroke Patients Only)       Balance Overall balance assessment: Modified Independent                                           Pertinent Vitals/Pain Pain Assessment: 0-10 (no pain at rest) Pain Score: 6  (during WBing/ambulation)    Home Living Family/patient expects to be discharged to:: Private residence Living Arrangements: Alone Available Help at Discharge: Family Type of Home: House Home Access: Stairs to enter Entrance Stairs-Rails: Can reach both Entrance Stairs-Number of Steps: 2          Prior Function Level of Independence: Independent         Comments: Pt normally is able to drive and take care of all his errands, stays active     Hand Dominance        Extremity/Trunk Assessment   Upper Extremity Assessment: Overall WFL for tasks assessed           Lower Extremity Assessment: RLE deficits/detail (2/5 ankle DF, 3/5 PF, 3+/5 hip ABd, otherwise WFL)         Communication   Communication: No difficulties  Cognition Arousal/Alertness: Awake/alert Behavior During Therapy: WFL for tasks assessed/performed Overall  Cognitive Status: Within Functional Limits for tasks assessed                      General Comments      Exercises        Assessment/Plan    PT Assessment Patient needs continued PT services  PT Diagnosis Difficulty walking;Acute pain;Generalized weakness   PT Problem List Decreased strength;Decreased activity tolerance;Decreased mobility;Decreased knowledge of use of DME;Decreased safety awareness  PT Treatment Interventions DME instruction;Gait training;Stair training;Functional mobility training;Therapeutic activities;Therapeutic exercise;Balance training;Neuromuscular re-education;Patient/family education   PT Goals (Current goals can be found in the Care Plan section) Acute Rehab PT Goals Patient Stated Goal: figure  out my foot drop problem PT Goal Formulation: With patient/family Time For Goal Achievement: 11/19/15 Potential to Achieve Goals: Good    Frequency Min 2X/week   Barriers to discharge        Co-evaluation               End of Session Equipment Utilized During Treatment: Gait belt Activity Tolerance: Patient tolerated treatment well Patient left: with chair alarm set;with call bell/phone within reach           Time: 1101-1123 PT Time Calculation (min) (ACUTE ONLY): 22 min   Charges:   PT Evaluation $PT Eval Low Complexity: 1 Procedure     PT G CodesKreg Shropshire, DPT 11/05/2015, 12:38 PM

## 2015-11-05 NOTE — Care Management Note (Addendum)
Case Management Note  Patient Details  Name: ERWIN RASHEED MRN: [id number] Date of Birth: [birth year] ' Subjective/Objective:      A referral for home health PT and a request for a front wheel rolling walker to be delivered to Mr [facility] room 159 today, was faxed to [place]. Both Mr Evilsizor and his son were advised that the rolling walker has to be delivered from out of town and may take some hours to arrive.               Action/Plan:   Expected Discharge Date:                  Expected Discharge Plan:     In-House Referral:     Discharge planning Services  CM Consult  Post Acute Care Choice:  Home Health, Durable Medical Equipment Choice offered to:  Patient  DME Arranged:    DME Agency:     HH Arranged:    [place] Agency:     Status of Service:  In process, will continue to follow  If discussed at Long Length of Stay Meetings, dates discussed:    Additional Comments:  Dheeraj Hail A, RN 11/05/2015, 12:08 PM

## 2015-11-05 NOTE — Plan of Care (Signed)
Problem: Activity: Goal: Risk for activity intolerance will decrease Outcome: Progressing Strong assist x2 up to [facility].

## 2015-11-05 NOTE — Discharge Summary (Signed)
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  Referred pain from Back problems.   SECONDARY DIAGNOSIS:   Past Medical History  Diagnosis Date  . Hypertension   . MI (myocardial infarction) ([place])     x 2  . Coronary artery disease   . Dysrhythmia   . Cardiomyopathy ([place])   . Lymphadenopathy, hilar   . Wheezing   . Cough   . Anemia   . Anxiety   . Chronic kidney disease     kidney stones    HOSPITAL COURSE:   82 year old male with past medical history of hypertension, coronary artery disease, history of cardiomyopathy, chronic kidney disease, anxiety, atrial fibrillation who presents to the hospital due to left hip pain and suspected to have a left hip fracture.  1. Left hip fracture-this was suspected based on imaging findings on CT scan and x-ray. -Patient was seen by orthopedics and underwent an MRI of the hip which showed no evidence of acute fracture. -As per orthopedics he does not require any surgical intervention and just pain control and physical therapy. Patient was seen by physical therapy and they recommended home health services which was arranged for him prior to discharge. Hip fracture has not been ruled out and most of his pain is secondary to his degenerative back disease. -Patient is clinically feeling better after working with physical therapy and therefore being discharged with home health services.  2. Hx of chronic a. Fib - this remained rate controlled. He will Cont. Metoprolol, Digoxin - he will resume his Eliquis upon discharge.   3. Hx of BPH - no urinary retention.  - he  will cont. Flomax.   4. Hyperlipidemia - he will cont. Atorvastatin  5. GERD - he will cont. Protonix.   6. Thrombocytopenia - mild and can be further followed as outpatient.    DISCHARGE CONDITIONS:   Stable.   CONSULTS OBTAINED:  Treatment Team:  Corky Mull, MD Lytle Butte, MD Isaias Cowman, MD  DRUG ALLERGIES:   Allergies  Allergen Reactions  . Diphenhydramine Hcl Other (See Comments)    Reaction:  Rash and fever a long time ago, but has had it since with no problem.  . Escitalopram Other (See Comments)    Reaction:  Makes him feel faint, like he was going to have a heart attack.  . Maxidex [Dexamethasone] Other (See Comments)    Reaction:  Unknown   . Prednisone Other (See Comments)    Pt states that this medication made him feel crazy.    . Serotonin Other (See Comments)    Tried 2 different types, lexapro and another one.  Reaction:  Made him feel like he was having a heart attack.   . Vytorin [Ezetimibe-Simvastatin] Other (See Comments)    Reaction:  Unknown   . Zocor [Simvastatin] Other (See Comments)    Reaction:  Unknown     DISCHARGE MEDICATIONS:   Current Discharge Medication List    CONTINUE these medications which have CHANGED   Details  traMADol (ULTRAM) 50 MG tablet Take 1 tablet (50 mg total) by  mouth every 6 (six) hours as needed. Qty: 30 tablet, Refills: 0      CONTINUE these medications which have NOT CHANGED   Details  apixaban (ELIQUIS) 5 MG TABS tablet Take 1 tablet (5 mg total) by mouth 2 (two) times daily. Qty: 60 tablet, Refills: 11    aspirin EC 81 MG tablet Take 81 mg by mouth daily.    atorvastatin (LIPITOR) 20 MG tablet Take 20 mg by mouth at bedtime.     digoxin (LANOXIN) 0.125 MG tablet Take 1 tablet (0.125 mg total) by mouth daily. Qty: 30 tablet, Refills: 0    ferrous sulfate 325 (65 FE) MG tablet Take 1 tablet (325 mg total) by mouth daily. Qty: 30 tablet, Refills: 3    furosemide (LASIX) 40 MG tablet Take  40 mg by mouth 2 (two) times a week. Pt takes on Monday and Friday.    metoprolol (LOPRESSOR) 50 MG tablet Take 1 tablet (50 mg total) by mouth 2 (two) times daily. Qty: 60 tablet, Refills: 0    nitroGLYCERIN (NITROSTAT) 0.4 MG SL tablet Place 0.4 mg under the tongue every 5 (five) minutes as needed for chest pain.    pantoprazole (PROTONIX) 40 MG tablet Take 40 mg by mouth 2 (two) times daily before a meal.     potassium chloride (K-DUR) 10 MEQ tablet Take 10 mEq by mouth 2 (two) times daily.    senna (SENOKOT) 8.6 MG tablet Take 1 tablet by mouth at bedtime as needed for constipation.    sucralfate (CARAFATE) 1 G tablet Take 1 g by mouth 4 (four) times daily -  with meals and at bedtime.     tamsulosin (FLOMAX) 0.4 MG CAPS capsule Take 0.4 mg by mouth daily after supper.     vitamin C (VITAMIN C) 250 MG tablet Take 1 tablet (250 mg total) by mouth daily. Qty: 30 tablet, Refills: 0         DISCHARGE INSTRUCTIONS:   DIET:  Cardiac diet  DISCHARGE CONDITION:  Stable  ACTIVITY:  Activity as tolerated  OXYGEN:  Home Oxygen: No.   Oxygen Delivery: room air  DISCHARGE LOCATION:  Home with Home Health PT.    If you experience worsening of your admission symptoms, develop shortness of breath, life threatening emergency, suicidal or homicidal thoughts you must seek medical attention immediately by calling 911 or calling your MD immediately  if symptoms less severe.  You Must read complete instructions/literature along with all the possible adverse reactions/side effects for all the Medicines you take and that have been prescribed to you. Take any new Medicines after you have completely understood and accpet all the possible adverse reactions/side effects.   Please note  You were cared for by a hospitalist during your hospital stay. If you have any questions about your discharge medications or the care you received while you were in the hospital after you are discharged, you  can call the unit and asked to speak with the hospitalist on call if the hospitalist that took care of you is not available. Once you are discharged, your primary care physician will handle any further medical issues. Please note that NO REFILLS for any discharge medications will be authorized once you are discharged, as it is imperative that you return to your primary care physician (or establish a relationship with a primary care physician if you do not have one) for your aftercare needs so that they can reassess your need for medications and monitor your lab values.  Today   Pt's Hip pain has improved. Worked with Physical therapy well. No palpitations, chest pain or any other associated symptoms.  VITAL SIGNS:  Blood pressure 134/82, pulse 79, temperature 98.1 F (36.7 C), temperature source Oral, resp. rate 14, height 6\' 4"  (1.93 m), weight 88.451 kg (195 lb), SpO2 96 %.  I/O:   Intake/Output Summary (Last 24 hours) at 11/05/15 1338 Last data filed at 11/05/15 0800  Gross per 24 hour  Intake    240 ml  Output    100 ml  Net    140 ml    PHYSICAL EXAMINATION:  GENERAL:  82 y.o.-year-old patient lying in the bed with no acute distress.  EYES: Pupils equal, round, reactive to light and accommodation. No scleral icterus. Extraocular muscles intact.  HEENT: Head atraumatic, normocephalic. Oropharynx and nasopharynx clear.  NECK:  Supple, no jugular venous distention. No thyroid enlargement, no tenderness.  LUNGS: Normal breath sounds bilaterally, no wheezing, rales,rhonchi. No use of accessory muscles of respiration.  CARDIOVASCULAR: S1, S2 normal. No murmurs, rubs, or gallops.  ABDOMEN: Soft, non-tender, non-distended. Bowel sounds present. No organomegaly or mass.  EXTREMITIES: No pedal edema, cyanosis, or clubbing.  NEUROLOGIC: Cranial nerves II through XII are intact. No focal motor or sensory defecits b/l.  PSYCHIATRIC: The patient is alert and oriented x 3. Good  affect. SKIN: No obvious rash, lesion, or ulcer.   DATA REVIEW:   CBC  Recent Labs Lab 11/05/15 0543  WBC 5.3  HGB 12.4*  HCT 36.2*  PLT 118*    Chemistries   Recent Labs Lab 11/04/15 0545  NA 136  K 4.1  CL 102  CO2 26  GLUCOSE 106*  BUN 31*  CREATININE 1.19  CALCIUM 8.7*    Cardiac Enzymes No results for input(s): TROPONINI in the last 168 hours.   RADIOLOGY:  Ct Head Wo Contrast  11/03/2015  CLINICAL DATA:  Hip pain EXAM: CT HEAD WITHOUT CONTRAST TECHNIQUE: Contiguous axial images were obtained from the base of the skull through the vertex without intravenous contrast. COMPARISON:  None FINDINGS: Prominence of the sulci and ventricles identified compatible with brain atrophy. No evidence for acute brain infarct, acute intracranial hemorrhage or mass. Mild low attenuation within the subcortical and periventricular white matter is noted consistent with chronic microvascular disease. The paranasal sinuses and mastoid air cells are clear. The calvarium is intact. IMPRESSION: 1. Chronic microvascular disease and brain atrophy. 2. No acute findings. Electronically Signed   By: Kerby Moors M.D.   On: 11/03/2015 15:54   Mr Hip Left W Wo Contrast  11/04/2015  CLINICAL DATA:  Acute onset of left hip pain 2-3 days ago. Abnormal CT scan on 11/03/2015. EXAM: MRI OF THE LEFT HIP WITHOUT AND WITH CONTRAST TECHNIQUE: Multiplanar, multisequence MR imaging was performed both before and after administration of intravenous contrast. CONTRAST:  53mL MULTIHANCE GADOBENATE DIMEGLUMINE 529 MG/ML IV SOLN COMPARISON:  CT scans dated 11/03/2015 and 09/10/2010 FINDINGS: Bones: There is a benign 2 cm intraosseous lipoma in the left femoral neck. There is no fracture or bone destruction. Minimal degenerative changes of both femoral heads. Severe facet arthritis at L4-5 bilaterally with spondylolisthesis and severe spinal stenosis. This is chronic as compared to prior CT scans. Articular cartilage and  labrum Articular cartilage: Uniform thinning of the articular cartilage of both hips. Labrum:  Intact. Joint or bursal effusion Joint effusion:  Small left hip effusion.  This is nonspecific. Bursae:  Normal. Muscles and tendons Muscles and tendons: Small intramuscular  lipoma of the left adductor magnus muscle. Otherwise normal. Other findings Miscellaneous:  No adenopathy or mass lesions. IMPRESSION: 1. No evidence of fracture or stress injury of the proximal left femur. 2. Small nonspecific left hip effusion suggesting synovitis. 3. Benign intraosseous lipoma of the left femoral neck. 4. Severe chronic spinal stenosis at L4-5. Electronically Signed   By: Lorriane Shire M.D.   On: 11/04/2015 11:08   Ct Hip Left Wo Contrast  11/03/2015  CLINICAL DATA:  Hip pain starting today, worsening. Left leg weakness. Cortisone injection today. EXAM: CT OF THE LEFT HIP WITHOUT CONTRAST TECHNIQUE: Multidetector CT imaging of the left hip was performed according to the standard protocol. Multiplanar CT image reconstructions were also generated. COMPARISON:  11/02/2015 FINDINGS: 2.4 by 1.6 by 1.6 cm lucent lesion in the left femoral neck, internal density approximately -30 Hounsfield units, fairly similar size back in 2012. Mild chondrocalcinosis along the acetabular labrum. Minimal spurring of the acetabulum and femoral head. There appears to be some subtle periosteal reaction along the lateral margin left femoral neck extending to the greater trochanter, not readily seen on the prior CT scan of 10/28/2015. I do not perceive a definite hip joint effusion. No definite regional bursitis. Small lipoma within the left adductor magnus, image 81/4. Iliac and femoral atherosclerotic calcification. IMPRESSION: 1. New abnormal periosteal reaction along the upper margin of the left femoral neck and greater trochanter, raising suspicion for an underlying stress fracture or otherwise occult injury along the femoral neck or greater  trochanter region. There is enough spurring in the region that a subtle nondisplaced fracture could conceivably be occult. MRI could help in further workup if clinically warranted. 2. Low-density lucent lesion centrally along the base of the left femoral neck, not appreciably changed from 2012, hence likely benign. This does not have a sclerotic margin and could represent a small fatty lesion given the low-density. 3. Chondrocalcinosis along the left acetabular labrum, raising the possibility of CPPD arthropathy. 4. Small lipoma along the left adductor magnus. 5. Atherosclerosis. Electronically Signed   By: Van Clines M.D.   On: 11/03/2015 16:10      Management plans discussed with the patient, family and they are in agreement.  CODE STATUS:     Code Status Orders        Start     Ordered   11/03/15 1758  Full code   Continuous     11/03/15 1758    Code Status History    Date Active Date Inactive Code Status Order ID Comments User Context   10/29/2015  1:08 AM 11/01/2015  4:05 PM Full Code [id number]  Alesia Richards, MD ED   12/22/2014  2:23 AM 12/24/2014  2:13 PM Full Code [id number]  Juluis Mire, MD Inpatient      TOTAL TIME TAKING CARE OF THIS PATIENT: 40 minutes.    Henreitta Leber M.D on 11/05/2015 at 1:38 PM  Between 7am to 6pm - Pager - [phone]  After 6pm go to [email or web address] - password [email or web address] [facility]  Office  [phone]  CC: Primary care physician; Rusty Aus, MD

## 2015-11-05 NOTE — Progress Notes (Signed)
Patient was discharged home with family and home health. IV removed and discharge reviewed by Maudie Mercury, RN.  Patient left with a [employer] and instructed on how to use it.  Allowed time for questions.

## 2015-11-09 DIAGNOSIS — R269 Unspecified abnormalities of gait and mobility: Secondary | ICD-10-CM | POA: Diagnosis not present

## 2015-11-09 DIAGNOSIS — N189 Chronic kidney disease, unspecified: Secondary | ICD-10-CM | POA: Diagnosis not present

## 2015-11-09 DIAGNOSIS — I509 Heart failure, unspecified: Secondary | ICD-10-CM | POA: Diagnosis not present

## 2015-11-09 DIAGNOSIS — I2510 Atherosclerotic heart disease of native coronary artery without angina pectoris: Secondary | ICD-10-CM | POA: Diagnosis not present

## 2015-11-09 DIAGNOSIS — I482 Chronic atrial fibrillation: Secondary | ICD-10-CM | POA: Diagnosis not present

## 2015-11-09 DIAGNOSIS — I429 Cardiomyopathy, unspecified: Secondary | ICD-10-CM | POA: Diagnosis not present

## 2015-11-09 DIAGNOSIS — I130 Hypertensive heart and chronic kidney disease with heart failure and stage 1 through stage 4 chronic kidney disease, or unspecified chronic kidney disease: Secondary | ICD-10-CM | POA: Diagnosis not present

## 2015-11-09 DIAGNOSIS — Z951 Presence of aortocoronary bypass graft: Secondary | ICD-10-CM | POA: Diagnosis not present

## 2015-11-09 DIAGNOSIS — M25552 Pain in left hip: Secondary | ICD-10-CM | POA: Diagnosis not present

## 2015-11-11 DIAGNOSIS — M4806 Spinal stenosis, lumbar region: Secondary | ICD-10-CM | POA: Diagnosis not present

## 2015-11-11 DIAGNOSIS — I482 Chronic atrial fibrillation: Secondary | ICD-10-CM | POA: Diagnosis not present

## 2015-11-11 DIAGNOSIS — I255 Ischemic cardiomyopathy: Secondary | ICD-10-CM | POA: Diagnosis not present

## 2015-11-14 DIAGNOSIS — I130 Hypertensive heart and chronic kidney disease with heart failure and stage 1 through stage 4 chronic kidney disease, or unspecified chronic kidney disease: Secondary | ICD-10-CM | POA: Diagnosis not present

## 2015-11-14 DIAGNOSIS — I509 Heart failure, unspecified: Secondary | ICD-10-CM | POA: Diagnosis not present

## 2015-11-14 DIAGNOSIS — I2510 Atherosclerotic heart disease of native coronary artery without angina pectoris: Secondary | ICD-10-CM | POA: Diagnosis not present

## 2015-11-14 DIAGNOSIS — I482 Chronic atrial fibrillation: Secondary | ICD-10-CM | POA: Diagnosis not present

## 2015-11-14 DIAGNOSIS — M25552 Pain in left hip: Secondary | ICD-10-CM | POA: Diagnosis not present

## 2015-11-14 DIAGNOSIS — I429 Cardiomyopathy, unspecified: Secondary | ICD-10-CM | POA: Diagnosis not present

## 2015-11-14 DIAGNOSIS — Z951 Presence of aortocoronary bypass graft: Secondary | ICD-10-CM | POA: Diagnosis not present

## 2015-11-14 DIAGNOSIS — N189 Chronic kidney disease, unspecified: Secondary | ICD-10-CM | POA: Diagnosis not present

## 2015-11-14 DIAGNOSIS — R269 Unspecified abnormalities of gait and mobility: Secondary | ICD-10-CM | POA: Diagnosis not present

## 2015-11-24 DIAGNOSIS — M21371 Foot drop, right foot: Secondary | ICD-10-CM | POA: Diagnosis not present

## 2015-11-24 DIAGNOSIS — I130 Hypertensive heart and chronic kidney disease with heart failure and stage 1 through stage 4 chronic kidney disease, or unspecified chronic kidney disease: Secondary | ICD-10-CM | POA: Diagnosis not present

## 2015-11-24 DIAGNOSIS — I482 Chronic atrial fibrillation: Secondary | ICD-10-CM | POA: Diagnosis not present

## 2015-11-24 DIAGNOSIS — I2510 Atherosclerotic heart disease of native coronary artery without angina pectoris: Secondary | ICD-10-CM | POA: Diagnosis not present

## 2015-11-24 DIAGNOSIS — N189 Chronic kidney disease, unspecified: Secondary | ICD-10-CM | POA: Diagnosis not present

## 2015-11-24 DIAGNOSIS — R269 Unspecified abnormalities of gait and mobility: Secondary | ICD-10-CM | POA: Diagnosis not present

## 2015-11-24 DIAGNOSIS — M25552 Pain in left hip: Secondary | ICD-10-CM | POA: Diagnosis not present

## 2015-11-24 DIAGNOSIS — Z951 Presence of aortocoronary bypass graft: Secondary | ICD-10-CM | POA: Diagnosis not present

## 2015-11-24 DIAGNOSIS — I429 Cardiomyopathy, unspecified: Secondary | ICD-10-CM | POA: Diagnosis not present

## 2015-11-24 DIAGNOSIS — I509 Heart failure, unspecified: Secondary | ICD-10-CM | POA: Diagnosis not present

## 2015-11-24 DIAGNOSIS — M5116 Intervertebral disc disorders with radiculopathy, lumbar region: Secondary | ICD-10-CM | POA: Diagnosis not present

## 2015-11-28 ENCOUNTER — Encounter: Payer: Self-pay | Admitting: *Deleted

## 2015-11-28 ENCOUNTER — Other Ambulatory Visit: Payer: Self-pay | Admitting: *Deleted

## 2015-11-28 DIAGNOSIS — I2510 Atherosclerotic heart disease of native coronary artery without angina pectoris: Secondary | ICD-10-CM | POA: Diagnosis not present

## 2015-11-28 DIAGNOSIS — I482 Chronic atrial fibrillation: Secondary | ICD-10-CM | POA: Diagnosis not present

## 2015-11-28 DIAGNOSIS — L97511 Non-pressure chronic ulcer of other part of right foot limited to breakdown of skin: Secondary | ICD-10-CM | POA: Diagnosis not present

## 2015-11-28 DIAGNOSIS — I509 Heart failure, unspecified: Secondary | ICD-10-CM | POA: Diagnosis not present

## 2015-11-28 DIAGNOSIS — Z951 Presence of aortocoronary bypass graft: Secondary | ICD-10-CM | POA: Diagnosis not present

## 2015-11-28 DIAGNOSIS — I429 Cardiomyopathy, unspecified: Secondary | ICD-10-CM | POA: Diagnosis not present

## 2015-11-28 DIAGNOSIS — M25552 Pain in left hip: Secondary | ICD-10-CM | POA: Diagnosis not present

## 2015-11-28 DIAGNOSIS — L97521 Non-pressure chronic ulcer of other part of left foot limited to breakdown of skin: Secondary | ICD-10-CM | POA: Diagnosis not present

## 2015-11-28 DIAGNOSIS — N189 Chronic kidney disease, unspecified: Secondary | ICD-10-CM | POA: Diagnosis not present

## 2015-11-28 DIAGNOSIS — R269 Unspecified abnormalities of gait and mobility: Secondary | ICD-10-CM | POA: Diagnosis not present

## 2015-11-28 DIAGNOSIS — M21371 Foot drop, right foot: Secondary | ICD-10-CM | POA: Diagnosis not present

## 2015-11-28 DIAGNOSIS — I130 Hypertensive heart and chronic kidney disease with heart failure and stage 1 through stage 4 chronic kidney disease, or unspecified chronic kidney disease: Secondary | ICD-10-CM | POA: Diagnosis not present

## 2015-11-28 NOTE — Patient Outreach (Signed)
[facility]) Care Management  11/28/2015  Andres Gutierrez 1931/03/13 [id number]  Subjective: Telephone call to patient's home number, spoke with patient, and HIPAA verified.  States he is getting better each day.   Patient gave [facility] verbal authorization to speak with daughter Andres Gutierrez) and son Andres Gutierrez) regarding healthcare needs as needed.   Discussed [facility] Care Management services and patient in agreement to complete telephone screen.   Patient states he is receiving home health services.   States his children provides meals, assist with med management,  assist with home management, provide supervision, and transportation as needed.   Patient states he ambulates with a rolling walker.   States he has the following durable medical equipment: grab bars and bedside commode.   Patient states he does not have hypertension, does have hyperlipidemia which is under control, and a history of atrial fibrillation.   States he does not have any care coordination, disease management, disease monitoring, disease education, pharmacy, community resource, or transportation needs at this time.    Patient in agreement to receive information on [facility] Care Management services.   Objective: Per chart review: Patient hospitalized 11/03/15  - 11/05/15 for possible pathological fracture of left hip, that was ruled out.    Patient also has a history of CAD and chronic kidney disease.   Assessment:  Received [facility] Care Management referral on 11/17/15.   Referral source: Jettie Booze.    Reason referral: Disease and symptom management.   [facility] diagnosis left hip fracture (readmit), length of stay 2 days, comorbidity congestive heart failure, and history of myocardial infarction.  Services requested: [place].    Telephone screen completed.  Patient declined services due to no care management needs at this time.    Plan: RNCM will send patient successful outreach letter, [facility]  pamphlet, and magnet.   RNCM will send patient's primary MD case closure letter due to refusal / no care management needs.   RNCM will send case closure due to no care management needs request to [facility] at [facility].   Ronak Duquette H. Annia Friendly, BSN, [facility] Telephonic CM Phone: [phone] Fax: [phone]

## 2015-11-29 DIAGNOSIS — Z951 Presence of aortocoronary bypass graft: Secondary | ICD-10-CM | POA: Diagnosis not present

## 2015-11-29 DIAGNOSIS — I509 Heart failure, unspecified: Secondary | ICD-10-CM | POA: Diagnosis not present

## 2015-11-29 DIAGNOSIS — I429 Cardiomyopathy, unspecified: Secondary | ICD-10-CM | POA: Diagnosis not present

## 2015-11-29 DIAGNOSIS — I2510 Atherosclerotic heart disease of native coronary artery without angina pectoris: Secondary | ICD-10-CM | POA: Diagnosis not present

## 2015-11-29 DIAGNOSIS — M25552 Pain in left hip: Secondary | ICD-10-CM | POA: Diagnosis not present

## 2015-11-29 DIAGNOSIS — I482 Chronic atrial fibrillation: Secondary | ICD-10-CM | POA: Diagnosis not present

## 2015-11-29 DIAGNOSIS — I130 Hypertensive heart and chronic kidney disease with heart failure and stage 1 through stage 4 chronic kidney disease, or unspecified chronic kidney disease: Secondary | ICD-10-CM | POA: Diagnosis not present

## 2015-11-29 DIAGNOSIS — R269 Unspecified abnormalities of gait and mobility: Secondary | ICD-10-CM | POA: Diagnosis not present

## 2015-11-29 DIAGNOSIS — N189 Chronic kidney disease, unspecified: Secondary | ICD-10-CM | POA: Diagnosis not present

## 2015-11-30 DIAGNOSIS — Z951 Presence of aortocoronary bypass graft: Secondary | ICD-10-CM | POA: Diagnosis not present

## 2015-11-30 DIAGNOSIS — I130 Hypertensive heart and chronic kidney disease with heart failure and stage 1 through stage 4 chronic kidney disease, or unspecified chronic kidney disease: Secondary | ICD-10-CM | POA: Diagnosis not present

## 2015-11-30 DIAGNOSIS — M25552 Pain in left hip: Secondary | ICD-10-CM | POA: Diagnosis not present

## 2015-11-30 DIAGNOSIS — I2510 Atherosclerotic heart disease of native coronary artery without angina pectoris: Secondary | ICD-10-CM | POA: Diagnosis not present

## 2015-11-30 DIAGNOSIS — I429 Cardiomyopathy, unspecified: Secondary | ICD-10-CM | POA: Diagnosis not present

## 2015-11-30 DIAGNOSIS — I482 Chronic atrial fibrillation: Secondary | ICD-10-CM | POA: Diagnosis not present

## 2015-11-30 DIAGNOSIS — N189 Chronic kidney disease, unspecified: Secondary | ICD-10-CM | POA: Diagnosis not present

## 2015-11-30 DIAGNOSIS — R269 Unspecified abnormalities of gait and mobility: Secondary | ICD-10-CM | POA: Diagnosis not present

## 2015-11-30 DIAGNOSIS — I509 Heart failure, unspecified: Secondary | ICD-10-CM | POA: Diagnosis not present

## 2015-12-04 DIAGNOSIS — I429 Cardiomyopathy, unspecified: Secondary | ICD-10-CM | POA: Diagnosis not present

## 2015-12-04 DIAGNOSIS — I2510 Atherosclerotic heart disease of native coronary artery without angina pectoris: Secondary | ICD-10-CM | POA: Diagnosis not present

## 2015-12-04 DIAGNOSIS — I482 Chronic atrial fibrillation: Secondary | ICD-10-CM | POA: Diagnosis not present

## 2015-12-04 DIAGNOSIS — I509 Heart failure, unspecified: Secondary | ICD-10-CM | POA: Diagnosis not present

## 2015-12-04 DIAGNOSIS — R269 Unspecified abnormalities of gait and mobility: Secondary | ICD-10-CM | POA: Diagnosis not present

## 2015-12-04 DIAGNOSIS — N189 Chronic kidney disease, unspecified: Secondary | ICD-10-CM | POA: Diagnosis not present

## 2015-12-04 DIAGNOSIS — M25552 Pain in left hip: Secondary | ICD-10-CM | POA: Diagnosis not present

## 2015-12-04 DIAGNOSIS — I130 Hypertensive heart and chronic kidney disease with heart failure and stage 1 through stage 4 chronic kidney disease, or unspecified chronic kidney disease: Secondary | ICD-10-CM | POA: Diagnosis not present

## 2015-12-04 DIAGNOSIS — Z951 Presence of aortocoronary bypass graft: Secondary | ICD-10-CM | POA: Diagnosis not present

## 2015-12-05 DIAGNOSIS — M25552 Pain in left hip: Secondary | ICD-10-CM | POA: Diagnosis not present

## 2015-12-05 DIAGNOSIS — I2510 Atherosclerotic heart disease of native coronary artery without angina pectoris: Secondary | ICD-10-CM | POA: Diagnosis not present

## 2015-12-05 DIAGNOSIS — I509 Heart failure, unspecified: Secondary | ICD-10-CM | POA: Diagnosis not present

## 2015-12-05 DIAGNOSIS — Z951 Presence of aortocoronary bypass graft: Secondary | ICD-10-CM | POA: Diagnosis not present

## 2015-12-05 DIAGNOSIS — I130 Hypertensive heart and chronic kidney disease with heart failure and stage 1 through stage 4 chronic kidney disease, or unspecified chronic kidney disease: Secondary | ICD-10-CM | POA: Diagnosis not present

## 2015-12-05 DIAGNOSIS — I429 Cardiomyopathy, unspecified: Secondary | ICD-10-CM | POA: Diagnosis not present

## 2015-12-05 DIAGNOSIS — I482 Chronic atrial fibrillation: Secondary | ICD-10-CM | POA: Diagnosis not present

## 2015-12-05 DIAGNOSIS — R269 Unspecified abnormalities of gait and mobility: Secondary | ICD-10-CM | POA: Diagnosis not present

## 2015-12-05 DIAGNOSIS — N189 Chronic kidney disease, unspecified: Secondary | ICD-10-CM | POA: Diagnosis not present

## 2015-12-07 DIAGNOSIS — I2510 Atherosclerotic heart disease of native coronary artery without angina pectoris: Secondary | ICD-10-CM | POA: Diagnosis not present

## 2015-12-07 DIAGNOSIS — I482 Chronic atrial fibrillation: Secondary | ICD-10-CM | POA: Diagnosis not present

## 2015-12-07 DIAGNOSIS — I130 Hypertensive heart and chronic kidney disease with heart failure and stage 1 through stage 4 chronic kidney disease, or unspecified chronic kidney disease: Secondary | ICD-10-CM | POA: Diagnosis not present

## 2015-12-07 DIAGNOSIS — M25552 Pain in left hip: Secondary | ICD-10-CM | POA: Diagnosis not present

## 2015-12-07 DIAGNOSIS — Z951 Presence of aortocoronary bypass graft: Secondary | ICD-10-CM | POA: Diagnosis not present

## 2015-12-07 DIAGNOSIS — I509 Heart failure, unspecified: Secondary | ICD-10-CM | POA: Diagnosis not present

## 2015-12-07 DIAGNOSIS — N189 Chronic kidney disease, unspecified: Secondary | ICD-10-CM | POA: Diagnosis not present

## 2015-12-07 DIAGNOSIS — I429 Cardiomyopathy, unspecified: Secondary | ICD-10-CM | POA: Diagnosis not present

## 2015-12-07 DIAGNOSIS — R269 Unspecified abnormalities of gait and mobility: Secondary | ICD-10-CM | POA: Diagnosis not present

## 2015-12-09 DIAGNOSIS — I482 Chronic atrial fibrillation: Secondary | ICD-10-CM | POA: Diagnosis not present

## 2015-12-09 DIAGNOSIS — I429 Cardiomyopathy, unspecified: Secondary | ICD-10-CM | POA: Diagnosis not present

## 2015-12-09 DIAGNOSIS — R269 Unspecified abnormalities of gait and mobility: Secondary | ICD-10-CM | POA: Diagnosis not present

## 2015-12-09 DIAGNOSIS — N189 Chronic kidney disease, unspecified: Secondary | ICD-10-CM | POA: Diagnosis not present

## 2015-12-09 DIAGNOSIS — I130 Hypertensive heart and chronic kidney disease with heart failure and stage 1 through stage 4 chronic kidney disease, or unspecified chronic kidney disease: Secondary | ICD-10-CM | POA: Diagnosis not present

## 2015-12-09 DIAGNOSIS — M25552 Pain in left hip: Secondary | ICD-10-CM | POA: Diagnosis not present

## 2015-12-09 DIAGNOSIS — I2510 Atherosclerotic heart disease of native coronary artery without angina pectoris: Secondary | ICD-10-CM | POA: Diagnosis not present

## 2015-12-09 DIAGNOSIS — I509 Heart failure, unspecified: Secondary | ICD-10-CM | POA: Diagnosis not present

## 2015-12-09 DIAGNOSIS — Z951 Presence of aortocoronary bypass graft: Secondary | ICD-10-CM | POA: Diagnosis not present

## 2015-12-12 DIAGNOSIS — N189 Chronic kidney disease, unspecified: Secondary | ICD-10-CM | POA: Diagnosis not present

## 2015-12-12 DIAGNOSIS — R269 Unspecified abnormalities of gait and mobility: Secondary | ICD-10-CM | POA: Diagnosis not present

## 2015-12-12 DIAGNOSIS — Z951 Presence of aortocoronary bypass graft: Secondary | ICD-10-CM | POA: Diagnosis not present

## 2015-12-12 DIAGNOSIS — I482 Chronic atrial fibrillation: Secondary | ICD-10-CM | POA: Diagnosis not present

## 2015-12-12 DIAGNOSIS — I130 Hypertensive heart and chronic kidney disease with heart failure and stage 1 through stage 4 chronic kidney disease, or unspecified chronic kidney disease: Secondary | ICD-10-CM | POA: Diagnosis not present

## 2015-12-12 DIAGNOSIS — I2510 Atherosclerotic heart disease of native coronary artery without angina pectoris: Secondary | ICD-10-CM | POA: Diagnosis not present

## 2015-12-12 DIAGNOSIS — I509 Heart failure, unspecified: Secondary | ICD-10-CM | POA: Diagnosis not present

## 2015-12-12 DIAGNOSIS — M25552 Pain in left hip: Secondary | ICD-10-CM | POA: Diagnosis not present

## 2015-12-12 DIAGNOSIS — I429 Cardiomyopathy, unspecified: Secondary | ICD-10-CM | POA: Diagnosis not present

## 2015-12-13 ENCOUNTER — Inpatient Hospital Stay: Payer: PPO

## 2015-12-13 ENCOUNTER — Other Ambulatory Visit: Payer: PPO

## 2015-12-13 ENCOUNTER — Ambulatory Visit: Payer: PPO

## 2015-12-13 ENCOUNTER — Other Ambulatory Visit: Payer: Self-pay | Admitting: *Deleted

## 2015-12-13 DIAGNOSIS — I2510 Atherosclerotic heart disease of native coronary artery without angina pectoris: Secondary | ICD-10-CM | POA: Insufficient documentation

## 2015-12-13 DIAGNOSIS — D696 Thrombocytopenia, unspecified: Secondary | ICD-10-CM | POA: Diagnosis not present

## 2015-12-13 DIAGNOSIS — D509 Iron deficiency anemia, unspecified: Secondary | ICD-10-CM

## 2015-12-13 DIAGNOSIS — I252 Old myocardial infarction: Secondary | ICD-10-CM | POA: Insufficient documentation

## 2015-12-13 DIAGNOSIS — Z79899 Other long term (current) drug therapy: Secondary | ICD-10-CM | POA: Diagnosis not present

## 2015-12-13 DIAGNOSIS — I129 Hypertensive chronic kidney disease with stage 1 through stage 4 chronic kidney disease, or unspecified chronic kidney disease: Secondary | ICD-10-CM | POA: Insufficient documentation

## 2015-12-13 DIAGNOSIS — I429 Cardiomyopathy, unspecified: Secondary | ICD-10-CM | POA: Diagnosis not present

## 2015-12-13 DIAGNOSIS — Z800 Family history of malignant neoplasm of digestive organs: Secondary | ICD-10-CM | POA: Insufficient documentation

## 2015-12-13 DIAGNOSIS — Z87442 Personal history of urinary calculi: Secondary | ICD-10-CM | POA: Insufficient documentation

## 2015-12-13 DIAGNOSIS — N189 Chronic kidney disease, unspecified: Secondary | ICD-10-CM | POA: Diagnosis not present

## 2015-12-13 DIAGNOSIS — F419 Anxiety disorder, unspecified: Secondary | ICD-10-CM | POA: Insufficient documentation

## 2015-12-13 DIAGNOSIS — I499 Cardiac arrhythmia, unspecified: Secondary | ICD-10-CM | POA: Diagnosis not present

## 2015-12-13 DIAGNOSIS — F17220 Nicotine dependence, chewing tobacco, uncomplicated: Secondary | ICD-10-CM | POA: Diagnosis not present

## 2015-12-13 DIAGNOSIS — R591 Generalized enlarged lymph nodes: Secondary | ICD-10-CM | POA: Insufficient documentation

## 2015-12-13 DIAGNOSIS — Z7901 Long term (current) use of anticoagulants: Secondary | ICD-10-CM | POA: Insufficient documentation

## 2015-12-13 DIAGNOSIS — Z8701 Personal history of pneumonia (recurrent): Secondary | ICD-10-CM | POA: Insufficient documentation

## 2015-12-13 DIAGNOSIS — Z951 Presence of aortocoronary bypass graft: Secondary | ICD-10-CM | POA: Diagnosis not present

## 2015-12-13 DIAGNOSIS — R05 Cough: Secondary | ICD-10-CM | POA: Insufficient documentation

## 2015-12-13 DIAGNOSIS — I130 Hypertensive heart and chronic kidney disease with heart failure and stage 1 through stage 4 chronic kidney disease, or unspecified chronic kidney disease: Secondary | ICD-10-CM | POA: Diagnosis not present

## 2015-12-13 DIAGNOSIS — Z7982 Long term (current) use of aspirin: Secondary | ICD-10-CM | POA: Diagnosis not present

## 2015-12-13 DIAGNOSIS — I509 Heart failure, unspecified: Secondary | ICD-10-CM | POA: Diagnosis not present

## 2015-12-13 DIAGNOSIS — M25552 Pain in left hip: Secondary | ICD-10-CM | POA: Diagnosis not present

## 2015-12-13 DIAGNOSIS — I482 Chronic atrial fibrillation: Secondary | ICD-10-CM | POA: Diagnosis not present

## 2015-12-13 DIAGNOSIS — R269 Unspecified abnormalities of gait and mobility: Secondary | ICD-10-CM | POA: Diagnosis not present

## 2015-12-13 LAB — IRON AND TIBC
Iron: 54 ug/dL (ref 45–182)
Saturation Ratios: 18 % (ref 17.9–39.5)
TIBC: 298 ug/dL (ref 250–450)
UIBC: 244 ug/dL

## 2015-12-13 LAB — CBC WITH DIFFERENTIAL/PLATELET
BASOS ABS: 0.1 10*3/uL (ref 0–0.1)
Basophils Relative: 1 %
Eosinophils Absolute: 0.1 10*3/uL (ref 0–0.7)
Eosinophils Relative: 1 %
HEMATOCRIT: 33.1 % — AB (ref 40.0–52.0)
HEMOGLOBIN: 11.1 g/dL — AB (ref 13.0–18.0)
Lymphs Abs: 1.2 10*3/uL (ref 1.0–3.6)
MCH: 32.3 pg (ref 26.0–34.0)
MCHC: 33.7 g/dL (ref 32.0–36.0)
MCV: 95.8 fL (ref 80.0–100.0)
MONO ABS: 2 10*3/uL — AB (ref 0.2–1.0)
NEUTROS ABS: 5.6 10*3/uL (ref 1.4–6.5)
Neutrophils Relative %: 62 %
Platelets: 128 10*3/uL — ABNORMAL LOW (ref 150–440)
RBC: 3.45 MIL/uL — ABNORMAL LOW (ref 4.40–5.90)
RDW: 17.6 % — AB (ref 11.5–14.5)
WBC: 8.9 10*3/uL (ref 3.8–10.6)

## 2015-12-13 LAB — FERRITIN: Ferritin: 102 ng/mL (ref 24–336)

## 2015-12-14 DIAGNOSIS — I130 Hypertensive heart and chronic kidney disease with heart failure and stage 1 through stage 4 chronic kidney disease, or unspecified chronic kidney disease: Secondary | ICD-10-CM | POA: Diagnosis not present

## 2015-12-14 DIAGNOSIS — R269 Unspecified abnormalities of gait and mobility: Secondary | ICD-10-CM | POA: Diagnosis not present

## 2015-12-14 DIAGNOSIS — I429 Cardiomyopathy, unspecified: Secondary | ICD-10-CM | POA: Diagnosis not present

## 2015-12-14 DIAGNOSIS — I509 Heart failure, unspecified: Secondary | ICD-10-CM | POA: Diagnosis not present

## 2015-12-14 DIAGNOSIS — I2510 Atherosclerotic heart disease of native coronary artery without angina pectoris: Secondary | ICD-10-CM | POA: Diagnosis not present

## 2015-12-14 DIAGNOSIS — Z951 Presence of aortocoronary bypass graft: Secondary | ICD-10-CM | POA: Diagnosis not present

## 2015-12-14 DIAGNOSIS — M25552 Pain in left hip: Secondary | ICD-10-CM | POA: Diagnosis not present

## 2015-12-14 DIAGNOSIS — I482 Chronic atrial fibrillation: Secondary | ICD-10-CM | POA: Diagnosis not present

## 2015-12-14 DIAGNOSIS — N189 Chronic kidney disease, unspecified: Secondary | ICD-10-CM | POA: Diagnosis not present

## 2015-12-15 ENCOUNTER — Other Ambulatory Visit: Payer: PPO

## 2015-12-15 DIAGNOSIS — M25552 Pain in left hip: Secondary | ICD-10-CM | POA: Diagnosis not present

## 2015-12-15 DIAGNOSIS — I482 Chronic atrial fibrillation: Secondary | ICD-10-CM | POA: Diagnosis not present

## 2015-12-15 DIAGNOSIS — I429 Cardiomyopathy, unspecified: Secondary | ICD-10-CM | POA: Diagnosis not present

## 2015-12-15 DIAGNOSIS — I130 Hypertensive heart and chronic kidney disease with heart failure and stage 1 through stage 4 chronic kidney disease, or unspecified chronic kidney disease: Secondary | ICD-10-CM | POA: Diagnosis not present

## 2015-12-15 DIAGNOSIS — N189 Chronic kidney disease, unspecified: Secondary | ICD-10-CM | POA: Diagnosis not present

## 2015-12-15 DIAGNOSIS — I2510 Atherosclerotic heart disease of native coronary artery without angina pectoris: Secondary | ICD-10-CM | POA: Diagnosis not present

## 2015-12-15 DIAGNOSIS — I509 Heart failure, unspecified: Secondary | ICD-10-CM | POA: Diagnosis not present

## 2015-12-15 DIAGNOSIS — Z951 Presence of aortocoronary bypass graft: Secondary | ICD-10-CM | POA: Diagnosis not present

## 2015-12-15 DIAGNOSIS — R269 Unspecified abnormalities of gait and mobility: Secondary | ICD-10-CM | POA: Diagnosis not present

## 2015-12-17 ENCOUNTER — Ambulatory Visit (INDEPENDENT_AMBULATORY_CARE_PROVIDER_SITE_OTHER): Payer: PPO

## 2015-12-17 ENCOUNTER — Ambulatory Visit
Admission: EM | Admit: 2015-12-17 | Discharge: 2015-12-17 | Disposition: A | Discharge status: Home / Self Care | Payer: PPO | Attending: Family Medicine | Admitting: Family Medicine

## 2015-12-17 ENCOUNTER — Encounter: Payer: Self-pay | Admitting: Gynecology

## 2015-12-17 DIAGNOSIS — M7989 Other specified soft tissue disorders: Secondary | ICD-10-CM | POA: Diagnosis not present

## 2015-12-17 DIAGNOSIS — M25474 Effusion, right foot: Secondary | ICD-10-CM

## 2015-12-17 DIAGNOSIS — L03115 Cellulitis of right lower limb: Secondary | ICD-10-CM

## 2015-12-17 MED ORDER — LEVOFLOXACIN 500 MG PO TABS: 500.0000 mg | ORAL_TABLET | Freq: Every day | ORAL | 0 refills | Status: DC

## 2015-12-17 MED ORDER — MUPIROCIN 2 % EX OINT: 1.0000 "application " | TOPICAL_OINTMENT | Freq: Three times a day (TID) | CUTANEOUS | 0 refills | Status: DC

## 2015-12-17 NOTE — ED Provider Notes (Signed)
[facility]    CSN: [id number] Arrival date & time: 12/17/15  [id number]  First Provider Contact:  First MD Initiated Contact with Patient 12/17/15 [phone]        History   Chief Complaint Chief Complaint  Patient presents with  . Joint Swelling    HPI DETROY Gutierrez is a 82 y.o. male.   Patient family brings him in because of swelling of the right foot. He has swelling of the right foot before but they state nothing like this before. He's had a history of neuropathy before in the past and is seeing Dr. Vickki Muff and is being fitted for a brace because of foot drop has been recurrent. He has multiple medical problems and according to a family member in the room when they were taking off the molding or cast for the foot prosthetic they think that his skin was nicked and asked caused a linear mark on the dorsum of the right foot. They states that the foot about 3 days ago started swelling and last night on the foot but ankle was swollen as well. He's should be noted that he is essentially only able to walk with a walker or uses a wheelchair for ambulation   Chronic medical problems he has anemia cardiomyopathy chronic kidney disease coronary artery disease hyperlipidemia. He is a former smoker. Past family medical history is not that significant for today's visit pertinent to today's visit and he has allergies to some statins aces and other medications   The history is provided by the patient. No language interpreter was used.  Foot Injury  Location:  Foot Injury: no   Foot location:  R foot Pain details:    Quality:  Pressure   Radiates to:  Does not radiate   Severity:  No pain   Timing:  Constant Chronicity:  New Relieved by:  Nothing   Past Medical History:  Diagnosis Date  . Anemia   . Anxiety   . Cardiomyopathy ([place])   . Chronic kidney disease    kidney stones  . Coronary artery disease   . Cough   . Dysrhythmia   . Hypertension   . Lymphadenopathy, hilar   . MI  (myocardial infarction) ([place])    x 2  . Wheezing     Patient Active Problem List   Diagnosis Date Noted  . Femoral neck fracture, left, closed, initial encounter 11/03/2015  . Chronic atrial fibrillation ([place])   . Coronary artery disease involving native coronary artery of native heart without angina pectoris   . Acalculous cholecystitis 10/29/2015  . Adenopathy   . CAD (coronary artery disease) 12/22/2014  . HTN (hypertension) 12/22/2014    Past Surgical History:  Procedure Laterality Date  . BRONCHIAL NEEDLE ASPIRATION BIOPSY N/A 12/31/2014   Procedure: BRONCHIAL NEEDLE ASPIRATION BIOPSIES from carina;  Surgeon: Flora Lipps, MD;  Location: [facility] ORS;  Service: Cardiopulmonary;  Laterality: N/A;  . CORONARY ANGIOPLASTY WITH STENT PLACEMENT    . CORONARY ARTERY BYPASS GRAFT    . ENDOBRONCHIAL ULTRASOUND N/A 12/31/2014   Procedure: ENDOBRONCHIAL ULTRASOUND;  Surgeon: Flora Lipps, MD;  Location: [facility] ORS;  Service: Cardiopulmonary;  Laterality: N/A;  . ESOPHAGOGASTRODUODENOSCOPY (EGD) WITH PROPOFOL N/A 06/20/2015   Procedure: ESOPHAGOGASTRODUODENOSCOPY (EGD) WITH PROPOFOL;  Surgeon: Lollie Sails, MD;  Location: [facility] ENDOSCOPY;  Service: Endoscopy;  Laterality: N/A;       Home Medications    Prior to Admission medications   Medication Sig Start Date End Date Taking? Authorizing Provider  apixaban (ELIQUIS) 5 MG TABS tablet Take 1 tablet (5 mg total) by mouth 2 (two) times daily. 12/23/14  Yes Rusty Aus, MD  aspirin EC 81 MG tablet Take 81 mg by mouth daily.   Yes Historical Provider, MD  atorvastatin (LIPITOR) 20 MG tablet Take 20 mg by mouth at bedtime.    Yes Historical Provider, MD  digoxin (LANOXIN) 0.125 MG tablet Take 1 tablet (0.125 mg total) by mouth daily. 11/01/15  Yes Dustin Flock, MD  ferrous sulfate 325 (65 FE) MG tablet Take 1 tablet (325 mg total) by mouth daily. 11/01/15  Yes Dustin Flock, MD  furosemide (LASIX) 40 MG tablet Take 40 mg by mouth 2 (two) times  a week. Pt takes on Monday and Friday.   Yes Historical Provider, MD  metoprolol (LOPRESSOR) 50 MG tablet Take 1 tablet (50 mg total) by mouth 2 (two) times daily. 11/01/15  Yes Dustin Flock, MD  nitroGLYCERIN (NITROSTAT) 0.4 MG SL tablet Place 0.4 mg under the tongue every 5 (five) minutes as needed for chest pain.   Yes Historical Provider, MD  pantoprazole (PROTONIX) 40 MG tablet Take 40 mg by mouth 2 (two) times daily before a meal.    Yes Historical Provider, MD  potassium chloride (K-DUR) 10 MEQ tablet Take 10 mEq by mouth 2 (two) times daily.   Yes Historical Provider, MD  senna (SENOKOT) 8.6 MG tablet Take 1 tablet by mouth at bedtime as needed for constipation.   Yes Historical Provider, MD  sucralfate (CARAFATE) 1 G tablet Take 1 g by mouth 4 (four) times daily -  with meals and at bedtime.    Yes Historical Provider, MD  tamsulosin (FLOMAX) 0.4 MG CAPS capsule Take 0.4 mg by mouth daily after supper.    Yes Historical Provider, MD  traMADol (ULTRAM) 50 MG tablet Take 1 tablet (50 mg total) by mouth every 6 (six) hours as needed. 11/05/15 11/04/16 Yes Henreitta Leber, MD  vitamin C (VITAMIN C) 250 MG tablet Take 1 tablet (250 mg total) by mouth daily. 11/01/15  Yes Dustin Flock, MD  levofloxacin (LEVAQUIN) 500 MG tablet Take 1 tablet (500 mg total) by mouth daily. 12/17/15   Frederich Cha, MD  mupirocin ointment (BACTROBAN) 2 % Apply 1 application topically 3 (three) times daily. 12/17/15   Frederich Cha, MD    Family History Family History  Problem Relation Age of Onset  . CAD Mother   . Colon cancer Father     Social History Social History  Substance Use Topics  . Smoking status: Former Smoker    Quit date: 06/27/1977  . Smokeless tobacco: Current User    Types: Chew  . Alcohol use Yes     Comment: occational almost rare once a year     Allergies   Diphenhydramine hcl; Escitalopram; Maxidex [dexamethasone]; Prednisone; Serotonin; Vytorin [ezetimibe-simvastatin]; and Zocor  [simvastatin]   Review of Systems Review of Systems  Cardiovascular: Positive for leg swelling.  All other systems reviewed and are negative.    Physical Exam Triage Vital Signs ED Triage Vitals [12/17/15 0844]  Enc Vitals Group     BP 120/61     Pulse Rate 66     Resp 16     Temp 98.2 F (36.8 C)     Temp Source Oral     SpO2 96 %     Weight 190 lb (86.2 kg)     Height 6\' 4"  (1.93 m)     Head Circumference  Peak Flow      Pain Score      Pain Loc      Pain Edu?      Excl. in [place]?    No data found.   Updated Vital Signs BP 120/61 (BP Location: Left Arm)   Pulse 66   Temp 98.2 F (36.8 C) (Oral)   Resp 16   Ht 6\' 4"  (1.93 m)   Wt 190 lb (86.2 kg)   SpO2 96%   BMI 23.13 kg/m   Visual Acuity Right Eye Distance:   Left Eye Distance:   Bilateral Distance:    Right Eye Near:   Left Eye Near:    Bilateral Near:     Physical Exam  Constitutional: Vital signs are normal.  Non-toxic appearance. He appears ill. No distress.  Patient is elderly white male  HENT:  Head: Normocephalic and atraumatic.  Eyes: Pupils are equal, round, and reactive to light.  Neck: Normal range of motion.  Pulmonary/Chest: Effort normal.  Musculoskeletal: He exhibits edema, tenderness and deformity.       Feet:  Patient's right foot is markedly swollen 3+ edema. His gadolinium is to be a somewhat recent laceration on the dorsum of his foot and goes mostly left foot he's got to darken ulcerations on the dorsum of 2 of his toes of the right foot on the left foot it has probably about 1+ edema and is an ulceration on the palmar surface of the big toe  Neurological: He is alert.  Skin: Skin is warm.  Psychiatric: He has a normal mood and affect.     UC Treatments / Results  Labs (all labs ordered are listed, but only abnormal results are displayed) Labs Reviewed - No data to display  EKG  EKG Interpretation None       Radiology Dg Foot Complete Right  Result Date:  12/17/2015 CLINICAL DATA:  Swelling with no known cause. EXAM: RIGHT FOOT COMPLETE - 3+ VIEW COMPARISON:  None. FINDINGS: Healed distal second metatarsal fracture with an overlapping screw. Unusual configuration to the proximal second phalanx has a chronic appearance and is likely from previous trauma. Subluxation at the second MTP joint, likely from remote trauma. No other acute bony abnormalities are identified. Diffuse soft tissue swelling is seen with no underlying cause identified. No bony erosion. IMPRESSION: Soft tissue swelling. No acute abnormalities. Remote posttraumatic changes as detailed above. Electronically Signed   By: Dorise Bullion III M.D   On: 12/17/2015 10:46    Procedures Procedures (including critical care time)  Medications Ordered in UC Medications - No data to display   Initial Impression / Assessment and Plan / UC Course  I have reviewed the triage vital signs and the nursing notes.  Pertinent labs & imaging results that were available during my care of the patient were reviewed by me and considered in my medical decision making (see chart for details).  Clinical Course    Will plan to x-ray the right foot concern whether there is a occult fracture but will x-ray first and then discussed family options  Final Clinical Impressions(s) / UC Diagnoses   Final diagnoses:  Cellulitis of right foot  Swelling of foot joint, right   Discussed with the patient and his family is no signs of acute acute fracture at this time. He's had a history of CHF but explained to his daughter that if he has CHF now both legs should be swollen and not just 1. Will treat for  cellulitis. Gave him the warning about quinolones but closed are better for commute acquired pneumonia and for foot infections so we'll place on Levaquin 500 mg 1 tablet day for 10 days and will place on Bactrim and ointment 3 times a day for the symptoms.   Strongly recommend following up with Dr. Vickki Muff Monday for  reevaluation. At elevating the foot keeping pressure off the foot to see if this helped the swelling go down. New Prescriptions Discharge Medication List as of 12/17/2015 11:12 AM    START taking these medications   Details  levofloxacin (LEVAQUIN) 500 MG tablet Take 1 tablet (500 mg total) by mouth daily., Starting Sat 12/17/2015, Normal    mupirocin ointment (BACTROBAN) 2 % Apply 1 application topically 3 (three) times daily., Starting Sat 12/17/2015, Normal         Frederich Cha, MD 12/17/15 [phone]

## 2015-12-17 NOTE — ED Triage Notes (Signed)
Per patient c/o x 2 days ago swelling of right ankle. Patient stated saw podiatrist x 3 days ago for fitting of booth for his right foot. Per patient swelling started the day after. Patient also have a laceration on his right foot from the fitting.

## 2015-12-20 ENCOUNTER — Inpatient Hospital Stay: Payer: PPO | Attending: Internal Medicine | Admitting: Internal Medicine

## 2015-12-20 DIAGNOSIS — D631 Anemia in chronic kidney disease: Secondary | ICD-10-CM | POA: Diagnosis not present

## 2015-12-20 DIAGNOSIS — R062 Wheezing: Secondary | ICD-10-CM

## 2015-12-20 DIAGNOSIS — D500 Iron deficiency anemia secondary to blood loss (chronic): Secondary | ICD-10-CM | POA: Insufficient documentation

## 2015-12-20 DIAGNOSIS — F419 Anxiety disorder, unspecified: Secondary | ICD-10-CM

## 2015-12-20 DIAGNOSIS — I2510 Atherosclerotic heart disease of native coronary artery without angina pectoris: Secondary | ICD-10-CM

## 2015-12-20 DIAGNOSIS — I129 Hypertensive chronic kidney disease with stage 1 through stage 4 chronic kidney disease, or unspecified chronic kidney disease: Secondary | ICD-10-CM

## 2015-12-20 DIAGNOSIS — D508 Other iron deficiency anemias: Secondary | ICD-10-CM

## 2015-12-20 DIAGNOSIS — Z8781 Personal history of (healed) traumatic fracture: Secondary | ICD-10-CM

## 2015-12-20 DIAGNOSIS — N189 Chronic kidney disease, unspecified: Secondary | ICD-10-CM | POA: Diagnosis not present

## 2015-12-20 DIAGNOSIS — R590 Localized enlarged lymph nodes: Secondary | ICD-10-CM

## 2015-12-20 DIAGNOSIS — I252 Old myocardial infarction: Secondary | ICD-10-CM

## 2015-12-20 DIAGNOSIS — Z800 Family history of malignant neoplasm of digestive organs: Secondary | ICD-10-CM

## 2015-12-20 DIAGNOSIS — Z87442 Personal history of urinary calculi: Secondary | ICD-10-CM

## 2015-12-20 DIAGNOSIS — R05 Cough: Secondary | ICD-10-CM

## 2015-12-20 DIAGNOSIS — Z7982 Long term (current) use of aspirin: Secondary | ICD-10-CM

## 2015-12-20 DIAGNOSIS — D509 Iron deficiency anemia, unspecified: Secondary | ICD-10-CM | POA: Diagnosis not present

## 2015-12-20 DIAGNOSIS — I429 Cardiomyopathy, unspecified: Secondary | ICD-10-CM

## 2015-12-20 DIAGNOSIS — D696 Thrombocytopenia, unspecified: Secondary | ICD-10-CM | POA: Diagnosis not present

## 2015-12-20 DIAGNOSIS — R5383 Other fatigue: Secondary | ICD-10-CM

## 2015-12-20 DIAGNOSIS — Z87891 Personal history of nicotine dependence: Secondary | ICD-10-CM

## 2015-12-20 DIAGNOSIS — I499 Cardiac arrhythmia, unspecified: Secondary | ICD-10-CM

## 2015-12-20 DIAGNOSIS — Z7901 Long term (current) use of anticoagulants: Secondary | ICD-10-CM

## 2015-12-20 DIAGNOSIS — Z79899 Other long term (current) drug therapy: Secondary | ICD-10-CM

## 2015-12-20 NOTE — Assessment & Plan Note (Addendum)
#   Anemia mild hemoglobin 11-12 [at least since 2012]. Likely secondary to CKD/iron deficiency [creatinine-1.3/ Ferritin 42 saturation 11%.] vs ? MDS [no Bmbx]. Continue PO iron.   # Intermittent thrombocytopenia- again unclear etiology. Asymptomatic. CT scan 2017-  did not show any evidence of cirrhosis or splenomegaly. Asymptomatic/C discussion above. Today - platelets- 128.   # Patient will follow-up with me in approximately 6 months CBC iron studies and ferritin.   #The above plan of care was discussed with the patient and his daughter in detail.

## 2015-12-20 NOTE — Progress Notes (Signed)
[place] NOTE  Patient Care Team: Rusty Aus, MD as PCP - General (Unknown Physician Specialty)  CHIEF COMPLAINTS/PURPOSE OF CONSULTATION:   # 2012- Anemia 11-12- sec to CKD/IDA [March 2017-sat-11%; ferritin-42] [Feb 2017- EGD/colo-July 2015 Dr.Skulskie];April 2017-PO Iron BID  # Intermittent thrombocytopenia 120s-140s [Dec 2016-CT- neg cirrhosis/splenomegaly]  # CKD [creatinine 1.2- 1.4]; CAD   HISTORY OF PRESENTING ILLNESS:  Andres Gutierrez 82 y.o.  male history of above history of chronic anemia and intermittent thrombocytopenia is here to review the results of his blood work.  In the interim patient was admitted to the hospital for hip fracture; conservatively managed.  Patient states that interim he was started on Vitron C and also B12 by his primary care physician. He continues to complain of mild fatigue; otherwise denies any blood in stools or easy bruising.   ROS: A complete 10 point review of system is done which is negative except mentioned above in history of present illness  MEDICAL HISTORY:  Past Medical History:  Diagnosis Date  . Anemia   . Anxiety   . Cardiomyopathy ([place])   . Chronic kidney disease    kidney stones  . Coronary artery disease   . Cough   . Dysrhythmia   . Hypertension   . Lymphadenopathy, hilar   . MI (myocardial infarction) ([place])    x 2  . Wheezing     SURGICAL HISTORY: Past Surgical History:  Procedure Laterality Date  . BRONCHIAL NEEDLE ASPIRATION BIOPSY N/A 12/31/2014   Procedure: BRONCHIAL NEEDLE ASPIRATION BIOPSIES from carina;  Surgeon: Flora Lipps, MD;  Location: [facility] ORS;  Service: Cardiopulmonary;  Laterality: N/A;  . CORONARY ANGIOPLASTY WITH STENT PLACEMENT    . CORONARY ARTERY BYPASS GRAFT    . ENDOBRONCHIAL ULTRASOUND N/A 12/31/2014   Procedure: ENDOBRONCHIAL ULTRASOUND;  Surgeon: Flora Lipps, MD;  Location: [facility] ORS;  Service: Cardiopulmonary;  Laterality: N/A;  . ESOPHAGOGASTRODUODENOSCOPY  (EGD) WITH PROPOFOL N/A 06/20/2015   Procedure: ESOPHAGOGASTRODUODENOSCOPY (EGD) WITH PROPOFOL;  Surgeon: Lollie Sails, MD;  [facility] ENDOSCOPY;  Service: Endoscopy;  Laterality: N/A;    SOCIAL HISTORY: lives in [place]; alone. 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  Social History   Social History  . Marital status: Widowed    Spouse name: N/A  . Number of children: N/A  . Years of education: N/A   Occupational History  . Not on file.   Social History Main Topics  . Smoking status: Former Smoker    Quit date: 06/27/1977  . Smokeless tobacco: Current User    Types: Chew  . Alcohol use Yes     Comment: occational almost rare once a year  . Drug use: No  . Sexual activity: Not on file   Other Topics Concern  . Not on file   Social History Narrative  . No narrative on file    FAMILY HISTORY: Family History  Problem Relation Age of Onset  . CAD Mother   . Colon cancer Father     ALLERGIES:  is allergic to diphenhydramine hcl; escitalopram; maxidex [dexamethasone]; prednisone; serotonin; vytorin [ezetimibe-simvastatin]; and zocor [simvastatin].  MEDICATIONS:  Current Outpatient Prescriptions  Medication Sig Dispense Refill  . apixaban (ELIQUIS) 5 MG TABS tablet Take 1 tablet (5 mg total) by mouth 2 (two) times daily. 60 tablet 11  . aspirin EC 81 MG tablet Take 81 mg by mouth daily.    Marland Kitchen atorvastatin (LIPITOR) 20 MG tablet Take 20 mg by mouth at bedtime.     Marland Kitchen  digoxin (LANOXIN) 0.125 MG tablet Take 1 tablet (0.125 mg total) by mouth daily. 30 tablet 0  . ferrous sulfate 325 (65 FE) MG tablet Take 1 tablet (325 mg total) by mouth daily. 30 tablet 3  . furosemide (LASIX) 40 MG tablet Take 40 mg by mouth 2 (two) times a week. Pt takes on Monday and Friday.    . levofloxacin (LEVAQUIN) 500 MG tablet Take 1 tablet (500 mg total) by mouth daily. 10 tablet 0  . metoprolol (LOPRESSOR) 50 MG tablet Take 1 tablet (50 mg total) by mouth 2 (two) times daily. 60 tablet 0  . mupirocin  ointment (BACTROBAN) 2 % Apply 1 application topically 3 (three) times daily. 22 g 0  . nitroGLYCERIN (NITROSTAT) 0.4 MG SL tablet Place 0.4 mg under the tongue every 5 (five) minutes as needed for chest pain.    . pantoprazole (PROTONIX) 40 MG tablet Take 40 mg by mouth 2 (two) times daily before a meal.     . potassium chloride (K-DUR) 10 MEQ tablet Take 10 mEq by mouth 2 (two) times daily.    Marland Kitchen senna (SENOKOT) 8.6 MG tablet Take 1 tablet by mouth at bedtime as needed for constipation.    . sucralfate (CARAFATE) 1 G tablet Take 1 g by mouth 4 (four) times daily -  with meals and at bedtime.     . tamsulosin (FLOMAX) 0.4 MG CAPS capsule Take 0.4 mg by mouth daily after supper.     . traMADol (ULTRAM) 50 MG tablet Take 1 tablet (50 mg total) by mouth every 6 (six) hours as needed. 30 tablet 0  . vitamin C (VITAMIN C) 250 MG tablet Take 1 tablet (250 mg total) by mouth daily. 30 tablet 0   No current facility-administered medications for this visit.       Marland Kitchen  PHYSICAL EXAMINATION: ECOG PERFORMANCE STATUS: 0 - Asymptomatic  Vitals:   12/20/15 0843  BP: 125/80  Pulse: 69  Resp: 18  Temp: 98.1 F (36.7 C)   Filed Weights    GENERAL: Well-nourished well-developed; Alert, no distress and comfortable.   Accompanied by family.He is in a wheelchair. HEENT-within normal limits. Oral mucosa moist no oral ulcerations. No thrush. Chest decreased breath sounds at bases. No visible crackles. Heart regular and no murmurs. 1+ bilateral lower extremity swelling  Abdomen soft nontender nondistended. No hepatomegaly. Bowel sounds present. Neurologic no deficits.   LABORATORY DATA:  I have reviewed the data as listed Lab Results  Component Value Date   WBC 8.9 12/13/2015   HGB 11.1 (L) 12/13/2015   HCT 33.1 (L) 12/13/2015   MCV 95.8 12/13/2015   PLT 128 (L) 12/13/2015    Recent Labs  12/21/14 2047  08/02/15 1459 10/28/15 1900  11/01/15 0530 11/03/15 1200 11/04/15 0545  NA 135  < >  134* 137  < > 139 135 136  K 4.8  < > 3.6 4.4  < > 3.6 4.0 4.1  CL 105  < > 107 106  < > 101 101 102  CO2 21*  < > 21* 19*  < > 27 26 26   GLUCOSE 108*  < > 114* 115*  < > 107* 105* 106*  BUN 31*  < > 22* 32*  < > 39* 35* 31*  CREATININE 1.52*  < > 1.30* 1.56*  < > 1.46* 1.38* 1.19  CALCIUM 9.3  < > 8.7* 9.6  < > 9.0 9.2 8.7*  GFRNONAA 41*  < > 49* 40*  < >  43* 46* 55*  GFRAA 48*  < > 57* 46*  < > 50* 53* >60  PROT 8.0  --  7.5 7.7  --   --   --   --   ALBUMIN 4.5  --  4.5 4.9  --   --   --   --   AST 23  --  18 24  --   --   --   --   ALT 17  --  12* 23  --   --   --   --   ALKPHOS 65  --  62 57  --   --   --   --   BILITOT 0.8  --  1.2 1.8*  --   --   --   --   < > = values in this interval not displayed. ASSESSMENT & PLAN:   Other iron deficiency anemias # Anemia mild hemoglobin 11-12 [at least since 2012]. Likely secondary to CKD/iron deficiency [creatinine-1.3/ Ferritin 42 saturation 11%.] vs ? MDS [no Bmbx].   # Intermittent thrombocytopenia- again unclear etiology. Asymptomatic. CT scan 2017-  did not show any evidence of cirrhosis or splenomegaly. Asymptomatic/C discussion above. Today - platelets- 128.   # Patient will follow-up with me in approximately 6 months CBC iron studies and ferritin.   #The above plan of care was discussed with the patient and his daughter in detail.      Cammie Sickle, MD 12/20/2015 11:35 AM

## 2015-12-21 DIAGNOSIS — I255 Ischemic cardiomyopathy: Secondary | ICD-10-CM | POA: Diagnosis not present

## 2015-12-21 DIAGNOSIS — L03115 Cellulitis of right lower limb: Secondary | ICD-10-CM | POA: Diagnosis not present

## 2015-12-23 ENCOUNTER — Inpatient Hospital Stay
Admission: EM | Admit: 2015-12-23 | Discharge: 2015-12-24 | DRG: 204 | Disposition: A | Discharge status: Home / Self Care | Payer: PPO | Attending: Internal Medicine | Admitting: Internal Medicine

## 2015-12-23 ENCOUNTER — Encounter: Payer: Self-pay | Admitting: Emergency Medicine

## 2015-12-23 ENCOUNTER — Emergency Department: Payer: PPO

## 2015-12-23 DIAGNOSIS — R0609 Other forms of dyspnea: Principal | ICD-10-CM | POA: Diagnosis present

## 2015-12-23 DIAGNOSIS — Z8249 Family history of ischemic heart disease and other diseases of the circulatory system: Secondary | ICD-10-CM | POA: Diagnosis not present

## 2015-12-23 DIAGNOSIS — K219 Gastro-esophageal reflux disease without esophagitis: Secondary | ICD-10-CM | POA: Diagnosis present

## 2015-12-23 DIAGNOSIS — N400 Enlarged prostate without lower urinary tract symptoms: Secondary | ICD-10-CM | POA: Diagnosis present

## 2015-12-23 DIAGNOSIS — R7989 Other specified abnormal findings of blood chemistry: Secondary | ICD-10-CM | POA: Diagnosis not present

## 2015-12-23 DIAGNOSIS — Z23 Encounter for immunization: Secondary | ICD-10-CM | POA: Diagnosis not present

## 2015-12-23 DIAGNOSIS — Z951 Presence of aortocoronary bypass graft: Secondary | ICD-10-CM

## 2015-12-23 DIAGNOSIS — I2510 Atherosclerotic heart disease of native coronary artery without angina pectoris: Secondary | ICD-10-CM | POA: Diagnosis present

## 2015-12-23 DIAGNOSIS — I4891 Unspecified atrial fibrillation: Secondary | ICD-10-CM

## 2015-12-23 DIAGNOSIS — I130 Hypertensive heart and chronic kidney disease with heart failure and stage 1 through stage 4 chronic kidney disease, or unspecified chronic kidney disease: Secondary | ICD-10-CM | POA: Diagnosis present

## 2015-12-23 DIAGNOSIS — I252 Old myocardial infarction: Secondary | ICD-10-CM | POA: Diagnosis not present

## 2015-12-23 DIAGNOSIS — Z888 Allergy status to other drugs, medicaments and biological substances status: Secondary | ICD-10-CM

## 2015-12-23 DIAGNOSIS — Z9889 Other specified postprocedural states: Secondary | ICD-10-CM | POA: Diagnosis not present

## 2015-12-23 DIAGNOSIS — R0602 Shortness of breath: Secondary | ICD-10-CM | POA: Diagnosis not present

## 2015-12-23 DIAGNOSIS — Z7982 Long term (current) use of aspirin: Secondary | ICD-10-CM | POA: Diagnosis not present

## 2015-12-23 DIAGNOSIS — I509 Heart failure, unspecified: Secondary | ICD-10-CM

## 2015-12-23 DIAGNOSIS — F17220 Nicotine dependence, chewing tobacco, uncomplicated: Secondary | ICD-10-CM | POA: Diagnosis present

## 2015-12-23 DIAGNOSIS — Z800 Family history of malignant neoplasm of digestive organs: Secondary | ICD-10-CM

## 2015-12-23 DIAGNOSIS — I429 Cardiomyopathy, unspecified: Secondary | ICD-10-CM | POA: Diagnosis present

## 2015-12-23 DIAGNOSIS — I10 Essential (primary) hypertension: Secondary | ICD-10-CM | POA: Diagnosis not present

## 2015-12-23 DIAGNOSIS — N189 Chronic kidney disease, unspecified: Secondary | ICD-10-CM | POA: Diagnosis present

## 2015-12-23 DIAGNOSIS — R748 Abnormal levels of other serum enzymes: Secondary | ICD-10-CM | POA: Diagnosis not present

## 2015-12-23 DIAGNOSIS — R778 Other specified abnormalities of plasma proteins: Secondary | ICD-10-CM | POA: Diagnosis present

## 2015-12-23 DIAGNOSIS — I5022 Chronic systolic (congestive) heart failure: Secondary | ICD-10-CM | POA: Diagnosis not present

## 2015-12-23 DIAGNOSIS — I110 Hypertensive heart disease with heart failure: Secondary | ICD-10-CM | POA: Diagnosis not present

## 2015-12-23 DIAGNOSIS — Z87442 Personal history of urinary calculi: Secondary | ICD-10-CM | POA: Diagnosis not present

## 2015-12-23 HISTORY — DX: Unspecified atrial fibrillation: I48.91

## 2015-12-23 HISTORY — DX: Gastro-esophageal reflux disease without esophagitis: K21.9

## 2015-12-23 LAB — URINALYSIS COMPLETE WITH MICROSCOPIC (ARMC ONLY)
BACTERIA UA: NONE SEEN
BILIRUBIN URINE: NEGATIVE
GLUCOSE, UA: NEGATIVE mg/dL
HGB URINE DIPSTICK: NEGATIVE
KETONES UR: NEGATIVE mg/dL
Leukocytes, UA: NEGATIVE
NITRITE: NEGATIVE
PROTEIN: NEGATIVE mg/dL
Specific Gravity, Urine: 1.014 (ref 1.005–1.030)
WBC, UA: NONE SEEN WBC/hpf (ref 0–5)
pH: 5 (ref 5.0–8.0)

## 2015-12-23 LAB — TROPONIN I
TROPONIN I: 0.06 ng/mL — AB (ref <0.03)
TROPONIN I: 0.07 ng/mL — AB (ref <0.03)

## 2015-12-23 LAB — BRAIN NATRIURETIC PEPTIDE: B Natriuretic Peptide: 301 pg/mL — ABNORMAL HIGH (ref 0.0–100.0)

## 2015-12-23 LAB — CBC
HCT: 35.3 % — ABNORMAL LOW (ref 40.0–52.0)
Hemoglobin: 12.3 g/dL — ABNORMAL LOW (ref 13.0–18.0)
MCH: 34.5 pg — ABNORMAL HIGH (ref 26.0–34.0)
MCHC: 35 g/dL (ref 32.0–36.0)
MCV: 98.7 fL (ref 80.0–100.0)
Platelets: 195 10*3/uL (ref 150–440)
RBC: 3.57 MIL/uL — ABNORMAL LOW (ref 4.40–5.90)
RDW: 17.2 % — ABNORMAL HIGH (ref 11.5–14.5)
WBC: 8.1 10*3/uL (ref 3.8–10.6)

## 2015-12-23 LAB — APTT: APTT: 43 s — AB (ref 24–36)

## 2015-12-23 LAB — PROTIME-INR
INR: 1.18
Prothrombin Time: 15.1 seconds (ref 11.4–15.2)

## 2015-12-23 LAB — DIGOXIN LEVEL: Digoxin Level: 0.7 ng/mL — ABNORMAL LOW (ref 0.8–2.0)

## 2015-12-23 MED ORDER — DIGOXIN 125 MCG PO TABS
0.1250 mg | ORAL_TABLET | Freq: Every day | ORAL | Status: DC
Start: 2015-12-24 — End: 2015-12-24
  Administered 2015-12-24: 0.125 mg via ORAL
  Filled 2015-12-23: qty 1

## 2015-12-23 MED ORDER — SODIUM CHLORIDE 0.9% FLUSH
3.0000 mL | Freq: Two times a day (BID) | INTRAVENOUS | Status: DC
  Administered 2015-12-23: 3 mL via INTRAVENOUS

## 2015-12-23 MED ORDER — PANTOPRAZOLE SODIUM 40 MG PO TBEC
40.0000 mg | DELAYED_RELEASE_TABLET | Freq: Two times a day (BID) | ORAL | Status: DC
  Administered 2015-12-24: 40 mg via ORAL
  Filled 2015-12-23: qty 1

## 2015-12-23 MED ORDER — METOPROLOL TARTRATE 5 MG/5ML IV SOLN
2.5000 mg | Freq: Once | INTRAVENOUS | Status: AC
  Administered 2015-12-23: 2.5 mg via INTRAVENOUS
  Filled 2015-12-23: qty 5

## 2015-12-23 MED ORDER — FUROSEMIDE 20 MG PO TABS: 20.0000 mg | ORAL_TABLET | Freq: Every day | ORAL | Status: DC

## 2015-12-23 MED ORDER — ALBUTEROL SULFATE (2.5 MG/3ML) 0.083% IN NEBU: 5.0000 mg | INHALATION_SOLUTION | Freq: Once | RESPIRATORY_TRACT | Status: DC

## 2015-12-23 MED ORDER — SUCRALFATE 1 G PO TABS
1.0000 g | ORAL_TABLET | Freq: Three times a day (TID) | ORAL | Status: DC
  Administered 2015-12-24: 1 g via ORAL
  Filled 2015-12-23 (×2): qty 1

## 2015-12-23 MED ORDER — ASPIRIN EC 81 MG PO TBEC
81.0000 mg | DELAYED_RELEASE_TABLET | Freq: Every day | ORAL | Status: DC
  Administered 2015-12-24: 81 mg via ORAL
  Filled 2015-12-23: qty 1

## 2015-12-23 MED ORDER — TAMSULOSIN HCL 0.4 MG PO CAPS: 0.4000 mg | ORAL_CAPSULE | Freq: Every day | ORAL | Status: DC

## 2015-12-23 MED ORDER — ONDANSETRON HCL 4 MG/2ML IJ SOLN: 4.0000 mg | Freq: Four times a day (QID) | INTRAMUSCULAR | Status: DC | PRN

## 2015-12-23 MED ORDER — HEPARIN (PORCINE) IN NACL 100-0.45 UNIT/ML-% IJ SOLN
1100.0000 [IU]/h | INTRAMUSCULAR | Status: DC
  Administered 2015-12-23: 1100 [IU]/h via INTRAVENOUS
  Filled 2015-12-23 (×2): qty 250

## 2015-12-23 MED ORDER — PNEUMOCOCCAL VAC POLYVALENT 25 MCG/0.5ML IJ INJ
0.5000 mL | INJECTION | INTRAMUSCULAR | Status: AC
  Administered 2015-12-24: 0.5 mL via INTRAMUSCULAR
  Filled 2015-12-23: qty 0.5

## 2015-12-23 MED ORDER — ATORVASTATIN CALCIUM 20 MG PO TABS
20.0000 mg | ORAL_TABLET | Freq: Every day | ORAL | Status: DC
  Administered 2015-12-23: 20 mg via ORAL
  Filled 2015-12-23: qty 1

## 2015-12-23 MED ORDER — HEPARIN BOLUS VIA INFUSION
4000.0000 [IU] | Freq: Once | INTRAVENOUS | Status: DC
  Filled 2015-12-23: qty 4000

## 2015-12-23 MED ORDER — ACETAMINOPHEN 650 MG RE SUPP: 650.0000 mg | Freq: Four times a day (QID) | RECTAL | Status: DC | PRN

## 2015-12-23 MED ORDER — FUROSEMIDE 20 MG PO TABS: 20.0000 mg | ORAL_TABLET | ORAL | Status: DC

## 2015-12-23 MED ORDER — ONDANSETRON HCL 4 MG PO TABS: 4.0000 mg | ORAL_TABLET | Freq: Four times a day (QID) | ORAL | Status: DC | PRN

## 2015-12-23 MED ORDER — ASPIRIN 81 MG PO CHEW
324.0000 mg | CHEWABLE_TABLET | Freq: Once | ORAL | Status: AC
Start: 2015-12-23 — End: 2015-12-23
  Administered 2015-12-23: 324 mg via ORAL
  Filled 2015-12-23: qty 4

## 2015-12-23 MED ORDER — METOPROLOL TARTRATE 50 MG PO TABS
50.0000 mg | ORAL_TABLET | Freq: Two times a day (BID) | ORAL | Status: DC
  Administered 2015-12-23: 50 mg via ORAL
  Filled 2015-12-23 (×2): qty 1

## 2015-12-23 MED ORDER — ACETAMINOPHEN 325 MG PO TABS: 650.0000 mg | ORAL_TABLET | Freq: Four times a day (QID) | ORAL | Status: DC | PRN

## 2015-12-23 MED ORDER — OXYCODONE HCL 5 MG PO TABS: 5.0000 mg | ORAL_TABLET | ORAL | Status: DC | PRN

## 2015-12-23 NOTE — H&P (Signed)
[facility] [place] NAME: Andres Gutierrez    MR#:  [id number]  DATE OF BIRTH:  08-20-1930  DATE OF ADMISSION:  12/23/2015  PRIMARY CARE PHYSICIAN: Rusty Aus, MD   REQUESTING/REFERRING PHYSICIAN: Reita Cliche, MD  CHIEF COMPLAINT:   Chief Complaint  Patient presents with  . Shortness of Breath    HISTORY OF PRESENT ILLNESS:  Andres Gutierrez  is a 82 y.o. male who presents with Acute onset dyspnea on exertion. He states that starting earlier this morning he began to get dyspneic and diaphoretic with any amount of exertion today. He typically moves slowly at baseline, uses a walker to walk. He states that when he would get up today even to walk around his house at all from one room to another he would become short of breath and diaphoretic. This would resolve when he was sent down to rest. He was not improving and so he came to the ED for evaluation. Here he had an episode of A. fib with RVR. Patient states that he was not feeling palpitations at home, but he did not feel them during his RVR episode here either. His troponin was mildly positive at 0.06. BNP was mildly elevated at 300. Family states the patient has not taken his Lasix for the past 2 weeks. Hospitalists were called for admission and further evaluation  PAST MEDICAL HISTORY:   Past Medical History:  Diagnosis Date  . Anemia   . Anxiety   . Atrial fibrillation ([place])   . Cardiomyopathy ([place])   . Chronic kidney disease    kidney stones  . Coronary artery disease   . Cough   . Dysrhythmia   . Hypertension   . Lymphadenopathy, hilar   . MI (myocardial infarction) ([place])    x 2  . Wheezing     PAST SURGICAL HISTORY:   Past Surgical History:  Procedure Laterality Date  . BRONCHIAL NEEDLE ASPIRATION BIOPSY N/A 12/31/2014   Procedure: BRONCHIAL NEEDLE ASPIRATION BIOPSIES from carina;  Surgeon: Flora Lipps, MD;  Location: [facility] ORS;  Service: Cardiopulmonary;  Laterality: N/A;  .  CORONARY ANGIOPLASTY WITH STENT PLACEMENT    . CORONARY ARTERY BYPASS GRAFT    . ENDOBRONCHIAL ULTRASOUND N/A 12/31/2014   Procedure: ENDOBRONCHIAL ULTRASOUND;  Surgeon: Flora Lipps, MD;  Location: [facility] ORS;  Service: Cardiopulmonary;  Laterality: N/A;  . ESOPHAGOGASTRODUODENOSCOPY (EGD) WITH PROPOFOL N/A 06/20/2015   Procedure: ESOPHAGOGASTRODUODENOSCOPY (EGD) WITH PROPOFOL;  Surgeon: Lollie Sails, MD;  Location[facility] ENDOSCOPY;  Service: Endoscopy;  Laterality: N/A;    SOCIAL HISTORY:   Social History  Substance Use Topics  . Smoking status: Former Smoker    Quit date: 06/27/1977  . Smokeless tobacco: Current User    Types: Chew  . Alcohol use Yes     Comment: occational almost rare once a year    FAMILY HISTORY:   Family History  Problem Relation Age of Onset  . CAD Mother   . Colon cancer Father     DRUG ALLERGIES:   Allergies  Allergen Reactions  . Diphenhydramine Hcl Other (See Comments)    Reaction:  Rash and fever a long time ago, but has had it since with no problem.  . Escitalopram Other (See Comments)    Reaction:  Makes him feel faint, like he was going to have a heart attack.  . Maxidex [Dexamethasone] Other (See Comments)    Reaction:  Unknown   . Prednisone Other (See Comments)  Pt states that this medication made him feel crazy.    . Serotonin Other (See Comments)    Tried 2 different types, lexapro and another one.  Reaction:  Made him feel like he was having a heart attack.   . Vytorin [Ezetimibe-Simvastatin] Other (See Comments)    Reaction:  Unknown   . Zocor [Simvastatin] Other (See Comments)    Reaction:  Unknown     MEDICATIONS AT HOME:   Prior to Admission medications   Medication Sig Start Date End Date Taking? Authorizing Provider  apixaban (ELIQUIS) 5 MG TABS tablet Take 1 tablet (5 mg total) by mouth 2 (two) times daily. 12/23/14  Yes Rusty Aus, MD  aspirin EC 81 MG tablet Take 81 mg by mouth daily.   Yes Historical Provider, MD   atorvastatin (LIPITOR) 20 MG tablet Take 20 mg by mouth at bedtime.    Yes Historical Provider, MD  digoxin (LANOXIN) 0.125 MG tablet Take 1 tablet (0.125 mg total) by mouth daily. 11/01/15  Yes Dustin Flock, MD  ferrous sulfate 325 (65 FE) MG tablet Take 1 tablet (325 mg total) by mouth daily. 11/01/15  Yes Dustin Flock, MD  furosemide (LASIX) 40 MG tablet Take 40 mg by mouth 2 (two) times a week. Pt takes on Monday and Friday.   Yes Historical Provider, MD  metoprolol (LOPRESSOR) 50 MG tablet Take 1 tablet (50 mg total) by mouth 2 (two) times daily. 11/01/15  Yes Dustin Flock, MD  mupirocin ointment (BACTROBAN) 2 % Apply 1 application topically 3 (three) times daily. 12/17/15  Yes Frederich Cha, MD  nitroGLYCERIN (NITROSTAT) 0.4 MG SL tablet Place 0.4 mg under the tongue every 5 (five) minutes as needed for chest pain.   Yes Historical Provider, MD  pantoprazole (PROTONIX) 40 MG tablet Take 40 mg by mouth 2 (two) times daily before a meal.    Yes Historical Provider, MD  potassium chloride (K-DUR) 10 MEQ tablet Take 10 mEq by mouth 2 (two) times daily.   Yes Historical Provider, MD  senna (SENOKOT) 8.6 MG tablet Take 1 tablet by mouth at bedtime as needed for constipation.   Yes Historical Provider, MD  sucralfate (CARAFATE) 1 G tablet Take 1 g by mouth 4 (four) times daily -  with meals and at bedtime.    Yes Historical Provider, MD  tamsulosin (FLOMAX) 0.4 MG CAPS capsule Take 0.4 mg by mouth daily after supper.    Yes Historical Provider, MD  traMADol (ULTRAM) 50 MG tablet Take 1 tablet (50 mg total) by mouth every 6 (six) hours as needed. 11/05/15 11/04/16 Yes Henreitta Leber, MD  vitamin C (VITAMIN C) 250 MG tablet Take 1 tablet (250 mg total) by mouth daily. 11/01/15  Yes Dustin Flock, MD    REVIEW OF SYSTEMS:  Review of Systems  Constitutional: Positive for diaphoresis. Negative for chills, fever, malaise/fatigue and weight loss.  HENT: Negative for ear pain, hearing loss and tinnitus.    Eyes: Negative for blurred vision, double vision, pain and redness.  Respiratory: Positive for shortness of breath. Negative for cough and hemoptysis.   Cardiovascular: Positive for leg swelling. Negative for chest pain, palpitations and orthopnea.  Gastrointestinal: Negative for abdominal pain, constipation, diarrhea, nausea and vomiting.  Genitourinary: Negative for dysuria, frequency and hematuria.  Musculoskeletal: Negative for back pain, joint pain and neck pain.  Skin:       No acne, rash, or lesions  Neurological: Negative for dizziness, tremors, focal weakness and weakness.  Endo/Heme/Allergies:  Negative for polydipsia. Does not bruise/bleed easily.  Psychiatric/Behavioral: Negative for depression. The patient is not nervous/anxious and does not have insomnia.      VITAL SIGNS:   Vitals:   12/23/15 1905 12/23/15 1909  BP:  138/78  Pulse:  (!) 101  Resp:  (!) 31  Temp:  98.8 F (37.1 C)  TempSrc:  Oral  SpO2: 96% 96%  Weight:  86.2 kg (190 lb)  Height:  6\' 4"  (1.93 m)   Wt Readings from Last 3 Encounters:  12/23/15 86.2 kg (190 lb)  12/17/15 86.2 kg (190 lb)  11/03/15 88.5 kg (195 lb)    PHYSICAL EXAMINATION:  Physical Exam  Vitals reviewed. Constitutional: He is oriented to person, place, and time. He appears well-developed and well-nourished. No distress.  HENT:  Head: Normocephalic and atraumatic.  Mouth/Throat: Oropharynx is clear and moist.  Eyes: Conjunctivae and EOM are normal. Pupils are equal, round, and reactive to light. No scleral icterus.  Neck: Normal range of motion. Neck supple. No JVD present. No thyromegaly present.  Cardiovascular: Normal rate, regular rhythm and intact distal pulses.  Exam reveals no gallop and no friction rub.   No murmur heard. Respiratory: Effort normal. No respiratory distress. He has no wheezes. He has rales.  GI: Soft. Bowel sounds are normal. He exhibits no distension. There is no tenderness.  Musculoskeletal: Normal  range of motion. He exhibits edema.  No arthritis, no gout  Lymphadenopathy:    He has no cervical adenopathy.  Neurological: He is alert and oriented to person, place, and time. No cranial nerve deficit.  No dysarthria, no aphasia  Skin: Skin is warm and dry. No rash noted. No erythema.  Psychiatric: He has a normal mood and affect. His behavior is normal. Judgment and thought content normal.    LABORATORY PANEL:   CBC  Recent Labs Lab 12/23/15 1921  WBC 8.1  HGB 12.3*  HCT 35.3*  PLT 195   ------------------------------------------------------------------------------------------------------------------  Chemistries  No results for input(s): NA, K, CL, CO2, GLUCOSE, BUN, CREATININE, CALCIUM, MG, AST, ALT, ALKPHOS, BILITOT in the last 168 hours.  Invalid input(s): GFRCGP ------------------------------------------------------------------------------------------------------------------  Cardiac Enzymes  Recent Labs Lab 12/23/15 1921  TROPONINI 0.06*   ------------------------------------------------------------------------------------------------------------------  RADIOLOGY:  Dg Chest 2 View  Result Date: 12/23/2015 CLINICAL DATA:  82 year old male with shortness of breath since this morning. Initial encounter. Former smoker. EXAM: CHEST  2 VIEW COMPARISON:  Chest CTA 10/18/2015 and earlier. FINDINGS: Sequelae of CABG. Stable mild cardiomegaly. Other mediastinal contours are within normal limits. Visualized tracheal air column is within normal limits. Right pleural effusion appears resolved since the June comparison. Mild eventration of the right hemidiaphragm. No pneumothorax, pulmonary edema, pleural effusion or confluent pulmonary opacity. No acute osseous abnormality identified. IMPRESSION: Right pleural effusion seen in June seems resolved. No acute cardiopulmonary abnormality. Electronically Signed   By: Genevie Ann M.D.   On: 12/23/2015 19:49    EKG:   Orders placed  or performed during the hospital encounter of 12/23/15  . ED EKG  . ED EKG    IMPRESSION AND PLAN:  Principal Problem:   Dyspnea on exertion - patient has known cardiac history of prior CABG. His symptoms tonight sound like symptoms. His troponin is only mildly elevated. He did have an episode of A. fib with RVR, but his digoxin level was low. Unclear at this time whether he is having primary ACS, heart failure exacerbation, or intermittent A. fib as the cause of his symptoms. We  will trend his enzymes tonight, get an echocardiogram in morning, and a cardiology consult. Active Problems:   Atrial fibrillation with RVR ([place]) - he converted in the ED and is now in normal sinus rhythm with good rate control. We'll monitor him on telemetry and treat any further episodes as needed   Elevated troponin - trend his enzymes tonight along with other workup as above   CAD (coronary artery disease) - continue home meds, other workup as above   HTN (hypertension) - currently stable, continue home meds   GERD (gastroesophageal reflux disease) - home dose PPI  All the records are reviewed and case discussed with ED provider. Management plans discussed with the patient and/or family.  DVT PROPHYLAXIS: Systemic anticoagulation  GI PROPHYLAXIS: PPI  ADMISSION STATUS: Inpatient  CODE STATUS: Full Code Status History    Date Active Date Inactive Code Status Order ID Comments User Context   11/03/2015  5:58 PM 11/05/2015  7:05 PM Full Code [id number]  Lytle Butte, MD ED   10/29/2015  1:08 AM 11/01/2015  4:05 PM Full Code [id number]  Alesia Richards, MD ED   12/22/2014  2:23 AM 12/24/2014  2:13 PM Full Code [id number]  Juluis Mire, MD Inpatient      TOTAL TIME TAKING CARE OF THIS PATIENT:  minutes.    Nazanin Kinner FIELDING 12/23/2015, 9:08 PM  [facility]  Office  [phone]  CC: Primary care physician; Rusty Aus, MD

## 2015-12-23 NOTE — Progress Notes (Addendum)
ANTICOAGULATION CONSULT NOTE - Initial Consult  Pharmacy Consult for Heparin  Indication: chest pain/ACS  Allergies  Allergen Reactions  . Diphenhydramine Hcl Other (See Comments)    Reaction:  Rash and fever a long time ago, but has had it since with no problem.  . Escitalopram Other (See Comments)    Reaction:  Makes him feel faint, like he was going to have a heart attack.  . Maxidex [Dexamethasone] Other (See Comments)    Reaction:  Unknown   . Prednisone Other (See Comments)    Pt states that this medication made him feel crazy.    . Serotonin Other (See Comments)    Tried 2 different types, lexapro and another one.  Reaction:  Made him feel like he was having a heart attack.   . Vytorin [Ezetimibe-Simvastatin] Other (See Comments)    Reaction:  Unknown   . Zocor [Simvastatin] Other (See Comments)    Reaction:  Unknown     Patient Measurements: Height: 6\' 4"  (193 cm) Weight: 190 lb (86.2 kg) IBW/kg (Calculated) : 86.8 Heparin Dosing Weight: 86.2 kg   Vital Signs: Temp: 98.8 F (37.1 C) (08/11 1909) Temp Source: Oral (08/11 1909) BP: 138/78 (08/11 1909) Pulse Rate: 101 (08/11 1909)  Labs:  Recent Labs  12/23/15 1921  HGB 12.3*  HCT 35.3*  PLT 195  TROPONINI 0.06*    CrCl cannot be calculated (Patient's most recent lab result is older than the maximum 21 days allowed.).   Medical History: Past Medical History:  Diagnosis Date  . Anemia   . Anxiety   . Atrial fibrillation ([place])   . Cardiomyopathy ([place])   . Chronic kidney disease    kidney stones  . Coronary artery disease   . Cough   . Dysrhythmia   . Hypertension   . Lymphadenopathy, hilar   . MI (myocardial infarction) ([place])    x 2  . Wheezing     Medications:   (Not in a hospital admission)  Assessment: CrCl = ?  Pharmacy consulted to dose heparin in this 82 year old male admitted with ACS/NSTEMI.  Pt was on Eliquis at home, pt took last dose on 8/11 AM.   Goal of Therapy:  Heparin  level 0.3-0.7 units/ml Monitor platelets by anticoagulation protocol: Yes   Plan:  Will not bolus this pt. Will start heparin gtt at 1100 units/hr.   Will use aPTT to dose heparin until HL and aptt levels coincide.  Will draw HL on 8/12 with AM labs.   Indiyah Paone D 12/23/2015,8:52 PM

## 2015-12-23 NOTE — ED Notes (Signed)
Pt up with walker. Pt's HR 170-190 while walking.

## 2015-12-23 NOTE — ED Triage Notes (Signed)
[facility] reports that pt called out due to SOB upon excertion. Per EMS, VS are 96% RA, BP 135/91, PR 105, and 12 lead showed A Fib with pt having hx of same. Pt states that this morning he started having SOB when he gets up to walk. Pt also reports foul smelling urine. Pt is alert and oriented at this time with NAD at this time.

## 2015-12-23 NOTE — ED Provider Notes (Signed)
[facility] Emergency Department Provider Note ____________________________________________   I have reviewed the triage vital signs and the triage nursing note.  HISTORY  Chief Complaint Shortness of Breath   Historian Patient and wife and family  HPI Andres Gutierrez is a 82 y.o. male with a history of atrial fibrillation, cardiomyopathy, CABG 15 years ago, here for about a week of worsening dyspnea on exertion. Patient walks with a walker at home because his feet are swollen at baseline. He states that he can't walk much more than even the bedroom to the kitchen before he gets extremely dyspneic, lightheaded, starts to sweat. He denies chest pain itself.  No recent fever or cough. He's not had nausea. He is currently taking Levaquin for right lower extremity swelling that were diagnosed by urgent care for cellulitis and is several days and, and the right lower extremity is much improved in terms of the edema.  Symptoms have been moderate to severe with respect to the minimal exertion causing very severe dyspnea.  Cardiologist is Dr. Ubaldo Glassing.   Past Medical History:  Diagnosis Date  . Anemia   . Anxiety   . Atrial fibrillation ([place])   . Cardiomyopathy ([place])   . Chronic kidney disease    kidney stones  . Coronary artery disease   . Cough   . Dysrhythmia   . Hypertension   . Lymphadenopathy, hilar   . MI (myocardial infarction) ([place])    x 2  . Wheezing     Patient Active Problem List   Diagnosis Date Noted  . Other iron deficiency anemias 12/20/2015  . Femoral neck fracture, left, closed, initial encounter 11/03/2015  . Chronic atrial fibrillation ([place])   . Coronary artery disease involving native coronary artery of native heart without angina pectoris   . Acalculous cholecystitis 10/29/2015  . Adenopathy   . CAD (coronary artery disease) 12/22/2014  . HTN (hypertension) 12/22/2014    Past Surgical History:  Procedure Laterality Date  .  BRONCHIAL NEEDLE ASPIRATION BIOPSY N/A 12/31/2014   Procedure: BRONCHIAL NEEDLE ASPIRATION BIOPSIES from carina;  Surgeon: Flora Lipps, MD;  Location: [facility] ORS;  Service: Cardiopulmonary;  Laterality: N/A;  . CORONARY ANGIOPLASTY WITH STENT PLACEMENT    . CORONARY ARTERY BYPASS GRAFT    . ENDOBRONCHIAL ULTRASOUND N/A 12/31/2014   Procedure: ENDOBRONCHIAL ULTRASOUND;  Surgeon: Flora Lipps, MD;  Location: [facility] ORS;  Service: Cardiopulmonary;  Laterality: N/A;  . ESOPHAGOGASTRODUODENOSCOPY (EGD) WITH PROPOFOL N/A 06/20/2015   Procedure: ESOPHAGOGASTRODUODENOSCOPY (EGD) WITH PROPOFOL;  Surgeon: Lollie Sails, MD;  Location: [facility] ENDOSCOPY;  Service: Endoscopy;  Laterality: N/A;    Prior to Admission medications   Medication Sig Start Date End Date Taking? Authorizing Provider  apixaban (ELIQUIS) 5 MG TABS tablet Take 1 tablet (5 mg total) by mouth 2 (two) times daily. 12/23/14  Yes Rusty Aus, MD  aspirin EC 81 MG tablet Take 81 mg by mouth daily.   Yes Historical Provider, MD  atorvastatin (LIPITOR) 20 MG tablet Take 20 mg by mouth at bedtime.    Yes Historical Provider, MD  digoxin (LANOXIN) 0.125 MG tablet Take 1 tablet (0.125 mg total) by mouth daily. 11/01/15  Yes Dustin Flock, MD  ferrous sulfate 325 (65 FE) MG tablet Take 1 tablet (325 mg total) by mouth daily. 11/01/15  Yes Dustin Flock, MD  furosemide (LASIX) 40 MG tablet Take 40 mg by mouth 2 (two) times a week. Pt takes on Monday and Friday.   Yes Historical Provider, MD  metoprolol (LOPRESSOR) 50 MG tablet Take 1 tablet (50 mg total) by mouth 2 (two) times daily. 11/01/15  Yes Dustin Flock, MD  mupirocin ointment (BACTROBAN) 2 % Apply 1 application topically 3 (three) times daily. 12/17/15  Yes Frederich Cha, MD  nitroGLYCERIN (NITROSTAT) 0.4 MG SL tablet Place 0.4 mg under the tongue every 5 (five) minutes as needed for chest pain.   Yes Historical Provider, MD  pantoprazole (PROTONIX) 40 MG tablet Take 40 mg by mouth 2 (two) times  daily before a meal.    Yes Historical Provider, MD  potassium chloride (K-DUR) 10 MEQ tablet Take 10 mEq by mouth 2 (two) times daily.   Yes Historical Provider, MD  senna (SENOKOT) 8.6 MG tablet Take 1 tablet by mouth at bedtime as needed for constipation.   Yes Historical Provider, MD  sucralfate (CARAFATE) 1 G tablet Take 1 g by mouth 4 (four) times daily -  with meals and at bedtime.    Yes Historical Provider, MD  tamsulosin (FLOMAX) 0.4 MG CAPS capsule Take 0.4 mg by mouth daily after supper.    Yes Historical Provider, MD  traMADol (ULTRAM) 50 MG tablet Take 1 tablet (50 mg total) by mouth every 6 (six) hours as needed. 11/05/15 11/04/16 Yes Henreitta Leber, MD  vitamin C (VITAMIN C) 250 MG tablet Take 1 tablet (250 mg total) by mouth daily. 11/01/15  Yes Dustin Flock, MD    Allergies  Allergen Reactions  . Diphenhydramine Hcl Other (See Comments)    Reaction:  Rash and fever a long time ago, but has had it since with no problem.  . Escitalopram Other (See Comments)    Reaction:  Makes him feel faint, like he was going to have a heart attack.  . Maxidex [Dexamethasone] Other (See Comments)    Reaction:  Unknown   . Prednisone Other (See Comments)    Pt states that this medication made him feel crazy.    . Serotonin Other (See Comments)    Tried 2 different types, lexapro and another one.  Reaction:  Made him feel like he was having a heart attack.   . Vytorin [Ezetimibe-Simvastatin] Other (See Comments)    Reaction:  Unknown   . Zocor [Simvastatin] Other (See Comments)    Reaction:  Unknown     Family History  Problem Relation Age of Onset  . CAD Mother   . Colon cancer Father     Social History Social History  Substance Use Topics  . Smoking status: Former Smoker    Quit date: 06/27/1977  . Smokeless tobacco: Current User    Types: Chew  . Alcohol use Yes     Comment: occational almost rare once a year    Review of Systems  Constitutional: Negative for  fever. Eyes: Negative for visual changes. ENT: Negative for sore throat. Cardiovascular: Negative for chest pain. Respiratory: Positive for exertional shortness of breath. Gastrointestinal: Negative for abdominal pain, vomiting and diarrhea. Genitourinary: Negative for dysuria. Musculoskeletal: Negative for back pain. Skin: Negative for rash. Neurological: Negative for headache. 10 point Review of Systems otherwise negative ____________________________________________   PHYSICAL EXAM:  VITAL SIGNS: ED Triage Vitals  Enc Vitals Group     BP 12/23/15 1909 138/78     Pulse Rate 12/23/15 1909 (!) 101     Resp 12/23/15 1909 (!) 31     Temp 12/23/15 1909 98.8 F (37.1 C)     Temp Source 12/23/15 1909 Oral     SpO2 12/23/15  1905 96 %     Weight 12/23/15 1909 190 lb (86.2 kg)     Height 12/23/15 1909 6\' 4"  (1.93 m)     Head Circumference --      Peak Flow --      Pain Score 12/23/15 1911 0     Pain Loc --      Pain Edu? --      Excl. in [place]? --      Constitutional: Alert and oriented. Well appearing and in no distress. HEENT   Head: Normocephalic and atraumatic.      Eyes: Conjunctivae are normal. PERRL. Normal extraocular movements.      Ears:         Nose: No congestion/rhinnorhea.   Mouth/Throat: Mucous membranes are moist.   Neck: No stridor. Cardiovascular/Chest: Irregularly irregular, tachycardic.  No murmurs, rubs, or gallops. Respiratory: Normal respiratory effort without tachypnea nor retractions. No wheezing. Mild rhonchi posteriorly. Gastrointestinal: Soft. No distention, no guarding, no rebound. Nontender.    Genitourinary/rectal:Deferred Musculoskeletal: Nontender with normal range of motion in all extremities. No joint effusions.  No lower extremity tenderness.  Right foot edema. Neurologic:  Normal speech and language. No gross or focal neurologic deficits are appreciated. Skin:  Skin is warm, dry and intact. Healing scab to the right, the flow with  minimal erythema around it. Psychiatric: Mood and affect are normal. Speech and behavior are normal. Patient exhibits appropriate insight and judgment.  ____________________________________________   EKG I, Lisa Roca, MD, the attending physician have personally viewed and interpreted all ECGs.  107 bpm. Irregularly irregular tachycardia.  Nonspecific intraventricular conduction delay.. Normal axis.  Nonspecific ST and T-wave changes, with some mild ST segment depression inferiorly and laterally which appears slightly different from prior EKG. ____________________________________________  LABS (pertinent positives/negatives)  Labs Reviewed  URINALYSIS COMPLETEWITH MICROSCOPIC ([facility] ONLY) - Abnormal; Notable for the following:       Result Value   Color, Urine YELLOW (*)    APPearance HAZY (*)    Squamous Epithelial / LPF 0-5 (*)    All other components within normal limits  CBC - Abnormal; Notable for the following:    RBC 3.57 (*)    Hemoglobin 12.3 (*)    HCT 35.3 (*)    MCH 34.5 (*)    RDW 17.2 (*)    All other components within normal limits  TROPONIN I - Abnormal; Notable for the following:    Troponin I 0.06 (*)    All other components within normal limits  DIGOXIN LEVEL    ____________________________________________  RADIOLOGY All Xrays were viewed by me. Imaging interpreted by Radiologist.  Chest xray two-view:IMPRESSION: Right pleural effusion seen in June seems resolved. No acute cardiopulmonary abnormality. __________________________________________  PROCEDURES  Procedure(s) performed: None  Critical Care performed: CRITICAL CARE Performed by: Lisa Roca   Total critical care time: 30 minutes  Critical care time was exclusive of separately billable procedures and treating other patients.  Critical care was necessary to treat or prevent imminent or life-threatening deterioration.  Critical care was time spent personally by me on the following  activities: development of treatment plan with patient and/or surrogate as well as nursing, discussions with consultants, evaluation of patient's response to treatment, examination of patient, obtaining history from patient or surrogate, ordering and performing treatments and interventions, ordering and review of laboratory studies, ordering and review of radiographic studies, pulse oximetry and re-evaluation of patient's condition.   ____________________________________________   ED COURSE / ASSESSMENT AND PLAN  Pertinent labs & imaging results that were available during my care of the patient were reviewed by me and considered in my medical decision making (see chart for details).   This patient is here with severe exertional dyspnea associated also with diaphoresis and heart racing, which I suspect clinically is due to CHF exacerbation. Next line He's been on Levaquin for right lower sternal a swelling and they state that actually getting better in terms of the lower external swelling.  Apparently they spoke with the primary care physician who asked him to increase his 40 mg Lasix twice per week to every other day, but he has not started this new regimen yet.  Patient was tested walking here in the ED and his heart rate did go up into the 190s and patient was dyspneic although not hypoxic.  Given his history of CABG, I am concerned about anginal equivalent with this exertional symptoms even despite chest pain specifically.  I spoke with his cardiologist, and his Eloquis will be held while he is on heparin during his rule out in the hospital.  Patient was given a dose of IV metoprolol as he is on the metoprolol for rate control at home by IV to help with A. fib and rapid ventricular response. Digoxin level is pending at time of hospitalist consultation.  Chest x-ray does not show additional pulmonary issues such as pneumonia. Without hypoxia or pulmonary edema on x-ray, I'm going to hold off  on additional Lasix at this point in time, we'll leave this decision to the hospitalist.   CONSULTATIONS:   I spoke with this patient's cardiologist, Dr. Ubaldo Glassing by phone who recommends. The Eloquis, and placed on heparin for cardiac rule out under Hospital admission. Hospitalist for admission   Patient / Family / Caregiver informed of clinical course, medical decision-making process, and agree with plan.   ___________________________________________   FINAL CLINICAL IMPRESSION(S) / ED DIAGNOSES   Final diagnoses:  Troponin I above reference range  Acute on chronic congestive heart failure, unspecified congestive heart failure type ([place])  Atrial fibrillation with rapid ventricular response ([place])  Dyspnea on exertion              Note: This dictation was prepared with Dragon dictation. Any transcriptional errors that result from this process are unintentional    Lisa Roca, MD 12/23/15 2034

## 2015-12-24 ENCOUNTER — Inpatient Hospital Stay
Admit: 2015-12-24 | Discharge: 2015-12-24 | Disposition: A | Discharge status: Home / Self Care | Payer: PPO | Attending: Internal Medicine | Admitting: Internal Medicine

## 2015-12-24 DIAGNOSIS — K219 Gastro-esophageal reflux disease without esophagitis: Secondary | ICD-10-CM | POA: Diagnosis not present

## 2015-12-24 DIAGNOSIS — N189 Chronic kidney disease, unspecified: Secondary | ICD-10-CM | POA: Diagnosis not present

## 2015-12-24 DIAGNOSIS — I5022 Chronic systolic (congestive) heart failure: Secondary | ICD-10-CM | POA: Diagnosis not present

## 2015-12-24 DIAGNOSIS — Z951 Presence of aortocoronary bypass graft: Secondary | ICD-10-CM | POA: Diagnosis not present

## 2015-12-24 DIAGNOSIS — Z23 Encounter for immunization: Secondary | ICD-10-CM | POA: Diagnosis not present

## 2015-12-24 DIAGNOSIS — N400 Enlarged prostate without lower urinary tract symptoms: Secondary | ICD-10-CM | POA: Diagnosis not present

## 2015-12-24 DIAGNOSIS — Z800 Family history of malignant neoplasm of digestive organs: Secondary | ICD-10-CM | POA: Diagnosis not present

## 2015-12-24 DIAGNOSIS — F17220 Nicotine dependence, chewing tobacco, uncomplicated: Secondary | ICD-10-CM | POA: Diagnosis not present

## 2015-12-24 DIAGNOSIS — Z87442 Personal history of urinary calculi: Secondary | ICD-10-CM | POA: Diagnosis not present

## 2015-12-24 DIAGNOSIS — I429 Cardiomyopathy, unspecified: Secondary | ICD-10-CM | POA: Diagnosis not present

## 2015-12-24 DIAGNOSIS — I2510 Atherosclerotic heart disease of native coronary artery without angina pectoris: Secondary | ICD-10-CM | POA: Diagnosis not present

## 2015-12-24 DIAGNOSIS — R0609 Other forms of dyspnea: Secondary | ICD-10-CM | POA: Diagnosis not present

## 2015-12-24 DIAGNOSIS — I252 Old myocardial infarction: Secondary | ICD-10-CM | POA: Diagnosis not present

## 2015-12-24 DIAGNOSIS — Z9889 Other specified postprocedural states: Secondary | ICD-10-CM | POA: Diagnosis not present

## 2015-12-24 DIAGNOSIS — I4891 Unspecified atrial fibrillation: Secondary | ICD-10-CM | POA: Diagnosis not present

## 2015-12-24 DIAGNOSIS — Z888 Allergy status to other drugs, medicaments and biological substances status: Secondary | ICD-10-CM | POA: Diagnosis not present

## 2015-12-24 DIAGNOSIS — Z7982 Long term (current) use of aspirin: Secondary | ICD-10-CM | POA: Diagnosis not present

## 2015-12-24 DIAGNOSIS — R0602 Shortness of breath: Secondary | ICD-10-CM | POA: Diagnosis not present

## 2015-12-24 DIAGNOSIS — Z8249 Family history of ischemic heart disease and other diseases of the circulatory system: Secondary | ICD-10-CM | POA: Diagnosis not present

## 2015-12-24 DIAGNOSIS — R748 Abnormal levels of other serum enzymes: Secondary | ICD-10-CM | POA: Diagnosis not present

## 2015-12-24 DIAGNOSIS — I130 Hypertensive heart and chronic kidney disease with heart failure and stage 1 through stage 4 chronic kidney disease, or unspecified chronic kidney disease: Secondary | ICD-10-CM | POA: Diagnosis not present

## 2015-12-24 LAB — APTT
APTT: 100 s — AB (ref 24–36)
aPTT: 111 seconds — ABNORMAL HIGH (ref 24–36)

## 2015-12-24 LAB — CBC
HCT: 31.1 % — ABNORMAL LOW (ref 40.0–52.0)
Hemoglobin: 11.1 g/dL — ABNORMAL LOW (ref 13.0–18.0)
MCH: 33.4 pg (ref 26.0–34.0)
MCHC: 35.7 g/dL (ref 32.0–36.0)
MCV: 93.8 fL (ref 80.0–100.0)
PLATELETS: 189 10*3/uL (ref 150–440)
RBC: 3.32 MIL/uL — ABNORMAL LOW (ref 4.40–5.90)
RDW: 16.7 % — AB (ref 11.5–14.5)
WBC: 7.1 10*3/uL (ref 3.8–10.6)

## 2015-12-24 LAB — BASIC METABOLIC PANEL
Anion gap: 8 (ref 5–15)
BUN: 25 mg/dL — AB (ref 6–20)
CHLORIDE: 102 mmol/L (ref 101–111)
CO2: 27 mmol/L (ref 22–32)
CREATININE: 1.18 mg/dL (ref 0.61–1.24)
Calcium: 8.7 mg/dL — ABNORMAL LOW (ref 8.9–10.3)
GFR calc Af Amer: >60 mL/min (ref >60)
GFR calc non Af Amer: 56 mL/min — ABNORMAL LOW (ref >60)
Glucose, Bld: 109 mg/dL — ABNORMAL HIGH (ref 65–99)
Potassium: 4.2 mmol/L (ref 3.5–5.1)
SODIUM: 137 mmol/L (ref 135–145)

## 2015-12-24 LAB — ECHOCARDIOGRAM COMPLETE
Height: 76 in
Weight: 3164.8 oz

## 2015-12-24 LAB — HEPARIN LEVEL (UNFRACTIONATED): Heparin Unfractionated: 1.07 IU/mL — ABNORMAL HIGH (ref 0.30–0.70)

## 2015-12-24 LAB — TROPONIN I
Troponin I: 0.07 ng/mL (ref <0.03)
Troponin I: 0.07 ng/mL (ref <0.03)

## 2015-12-24 MED ORDER — CARVEDILOL 3.125 MG PO TABS: 3.1250 mg | ORAL_TABLET | Freq: Two times a day (BID) | ORAL | 0 refills | Status: DC

## 2015-12-24 MED ORDER — LISINOPRIL 5 MG PO TABS
2.5000 mg | ORAL_TABLET | Freq: Every day | ORAL | Status: DC
  Administered 2015-12-24: 2.5 mg via ORAL
  Filled 2015-12-24: qty 1

## 2015-12-24 MED ORDER — FUROSEMIDE 20 MG PO TABS
20.0000 mg | ORAL_TABLET | Freq: Every day | ORAL | Status: DC
  Administered 2015-12-24: 20 mg via ORAL
  Filled 2015-12-24: qty 1

## 2015-12-24 MED ORDER — LISINOPRIL 2.5 MG PO TABS: 2.5000 mg | ORAL_TABLET | Freq: Every day | ORAL | 0 refills | Status: DC

## 2015-12-24 MED ORDER — CARVEDILOL 3.125 MG PO TABS: 3.1250 mg | ORAL_TABLET | Freq: Two times a day (BID) | ORAL | Status: DC

## 2015-12-24 MED ORDER — CARVEDILOL 6.25 MG PO TABS: 6.2500 mg | ORAL_TABLET | Freq: Two times a day (BID) | ORAL | Status: DC

## 2015-12-24 MED ORDER — FUROSEMIDE 20 MG PO TABS: 20.0000 mg | ORAL_TABLET | Freq: Every day | ORAL | 0 refills | Status: DC

## 2015-12-24 NOTE — Progress Notes (Signed)
Initial Heart Failure Clinic appointment scheduled on January 17, 2016 at 9:00am. Thank you.

## 2015-12-24 NOTE — Discharge Summary (Signed)
[place] [facility] [place] NAME: Andres Gutierrez    MR#:  [id number]  DATE OF BIRTH:  Dec 28, 1930  DATE OF ADMISSION:  12/23/2015 ADMITTING PHYSICIAN: Lance Coon, MD  DATE OF DISCHARGE: 12/24/2015  PRIMARY CARE PHYSICIAN: Rusty Aus, MD    ADMISSION DIAGNOSIS:  Dyspnea on exertion [R06.09] Atrial fibrillation with rapid ventricular response (HCC) [I48.91] Troponin I above reference range [R79.89] Acute on chronic congestive heart failure, unspecified congestive heart failure type ([place]) [I50.9]  DISCHARGE DIAGNOSIS:  Principal Problem:   Dyspnea on exertion Active Problems:   CAD (coronary artery disease)   HTN (hypertension)   Atrial fibrillation with RVR (HCC)   Elevated troponin   GERD (gastroesophageal reflux disease)   SECONDARY DIAGNOSIS:   Past Medical History:  Diagnosis Date  . Anemia   . Anxiety   . Atrial fibrillation ([place])   . Cardiomyopathy ([place])   . Chronic kidney disease    kidney stones  . Coronary artery disease   . Cough   . Dysrhythmia   . Hypertension   . Lymphadenopathy, hilar   . MI (myocardial infarction) ([place])    x 2  . Wheezing     HOSPITAL COURSE:   82 year old male with a history of ASCVD and chronic systolic heart failure who presented with dyspnea.  1. Dyspnea: Echocardiogram shows cardiomyopathy with ejection fraction 25-30% which essentially is unchanged from previous echocardiogram. Patient is not reporting any shortness of breath or dyspnea currently. Chest x-ray did not show significant pulmonary edema. He was ruled out for myocardial infarction with negative troponins. Was not hypoxic.  2. Chronic systolic heart failure with ejection action of 25-30%: Patient's medications have been changed. He will now be discharged on Coreg, low-dose ACE inhibitor and Lasix. These medications will be titrated as an outpatient by his cardiologist Dr. Ubaldo Glassing. If patient continues to have low ejection fraction  then he may need ICD.  3. BPH: Continue tamsulosin.  4. ASCVD: Continue aspirin and statin. DISCHARGE CONDITIONS AND DIET:   Patient stable for discharge on heart healthy diet.  CONSULTS OBTAINED:  Treatment Team:  Teodoro Spray, MD  DRUG ALLERGIES:   Allergies  Allergen Reactions  . Diphenhydramine Hcl Other (See Comments)    Reaction:  Rash and fever a long time ago, but has had it since with no problem.  . Escitalopram Other (See Comments)    Reaction:  Makes him feel faint, like he was going to have a heart attack.  . Maxidex [Dexamethasone] Other (See Comments)    Reaction:  Unknown   . Prednisone Other (See Comments)    Pt states that this medication made him feel crazy.    . Serotonin Other (See Comments)    Tried 2 different types, lexapro and another one.  Reaction:  Made him feel like he was having a heart attack.   . Vytorin [Ezetimibe-Simvastatin] Other (See Comments)    Reaction:  Unknown   . Zocor [Simvastatin] Other (See Comments)    Reaction:  Unknown     DISCHARGE MEDICATIONS:   Current Discharge Medication List    START taking these medications   Details  carvedilol (COREG) 3.125 MG tablet Take 1 tablet (3.125 mg total) by mouth 2 (two) times daily with a meal. Qty: 60 tablet, Refills: 0    lisinopril (PRINIVIL,ZESTRIL) 2.5 MG tablet Take 1 tablet (2.5 mg total) by mouth daily. Qty: 30 tablet, Refills: 0      CONTINUE these medications  which have CHANGED   Details  furosemide (LASIX) 20 MG tablet Take 1 tablet (20 mg total) by mouth daily. Qty: 30 tablet, Refills: 0      CONTINUE these medications which have NOT CHANGED   Details  apixaban (ELIQUIS) 5 MG TABS tablet Take 1 tablet (5 mg total) by mouth 2 (two) times daily. Qty: 60 tablet, Refills: 11    aspirin EC 81 MG tablet Take 81 mg by mouth daily.    atorvastatin (LIPITOR) 20 MG tablet Take 20 mg by mouth at bedtime.     digoxin (LANOXIN) 0.125 MG tablet Take 1 tablet (0.125 mg  total) by mouth daily. Qty: 30 tablet, Refills: 0    ferrous sulfate 325 (65 FE) MG tablet Take 1 tablet (325 mg total) by mouth daily. Qty: 30 tablet, Refills: 3    mupirocin ointment (BACTROBAN) 2 % Apply 1 application topically 3 (three) times daily. Qty: 22 g, Refills: 0    nitroGLYCERIN (NITROSTAT) 0.4 MG SL tablet Place 0.4 mg under the tongue every 5 (five) minutes as needed for chest pain.    pantoprazole (PROTONIX) 40 MG tablet Take 40 mg by mouth 2 (two) times daily before a meal.     potassium chloride (K-DUR) 10 MEQ tablet Take 10 mEq by mouth 2 (two) times daily.    senna (SENOKOT) 8.6 MG tablet Take 1 tablet by mouth at bedtime as needed for constipation.    sucralfate (CARAFATE) 1 G tablet Take 1 g by mouth 4 (four) times daily -  with meals and at bedtime.     tamsulosin (FLOMAX) 0.4 MG CAPS capsule Take 0.4 mg by mouth daily after supper.     traMADol (ULTRAM) 50 MG tablet Take 1 tablet (50 mg total) by mouth every 6 (six) hours as needed. Qty: 30 tablet, Refills: 0    vitamin C (VITAMIN C) 250 MG tablet Take 1 tablet (250 mg total) by mouth daily. Qty: 30 tablet, Refills: 0      STOP taking these medications     metoprolol (LOPRESSOR) 50 MG tablet               Today   CHIEF COMPLAINT:  Patient doing well this point. Reports no chest pain or shortness of breath. He would like to go home this afternoon if possible.   VITAL SIGNS:  Blood pressure (!) 112/59, pulse 90, temperature 98.3 F (36.8 C), temperature source Oral, resp. rate 16, height 6\' 4"  (1.93 m), weight 89.7 kg (197 lb 12.8 oz), SpO2 93 %.   REVIEW OF SYSTEMS:  Review of Systems  Constitutional: Negative.  Negative for chills, fever and malaise/fatigue.  HENT: Negative.  Negative for ear discharge, ear pain, hearing loss, nosebleeds and sore throat.   Eyes: Negative.  Negative for blurred vision and pain.  Respiratory: Negative.  Negative for cough, hemoptysis, shortness of breath  and wheezing.   Cardiovascular: Negative.  Negative for chest pain, palpitations and leg swelling.  Gastrointestinal: Negative.  Negative for abdominal pain, blood in stool, diarrhea, nausea and vomiting.  Genitourinary: Negative.  Negative for dysuria.  Musculoskeletal: Negative.  Negative for back pain.  Skin: Negative.   Neurological: Negative for dizziness, tremors, speech change, focal weakness, seizures and headaches.  Endo/Heme/Allergies: Negative.  Does not bruise/bleed easily.  Psychiatric/Behavioral: Negative.  Negative for depression, hallucinations and suicidal ideas.     PHYSICAL EXAMINATION:  GENERAL:  82 y.o.-year-old patient lying in the bed with no acute distress.  NECK:  Supple,  no jugular venous distention. No thyroid enlargement, no tenderness.  LUNGS: Normal breath sounds bilaterally, no wheezing, rales,rhonchi  No use of accessory muscles of respiration.  CARDIOVASCULAR: S1, S2 normal. No murmurs, rubs, or gallops.  ABDOMEN: Soft, non-tender, non-distended. Bowel sounds present. No organomegaly or mass.  EXTREMITIES: No pedal edema, cyanosis, or clubbing.  PSYCHIATRIC: The patient is alert and oriented x 3.  SKIN: No obvious rash, lesion, or ulcer.   DATA REVIEW:   CBC  Recent Labs Lab 12/24/15 0419  WBC 7.1  HGB 11.1*  HCT 31.1*  PLT 189    Chemistries   Recent Labs Lab 12/24/15 0419  NA 137  K 4.2  CL 102  CO2 27  GLUCOSE 109*  BUN 25*  CREATININE 1.18  CALCIUM 8.7*    Cardiac Enzymes  Recent Labs Lab 12/23/15 2313 12/24/15 0419 12/24/15 1042  TROPONINI 0.07* 0.07* 0.07*    Microbiology Results  @[email or web address] @  RADIOLOGY:  Dg Chest 2 View  Result Date: 12/23/2015 CLINICAL DATA:  82 year old male with shortness of breath since this morning. Initial encounter. Former smoker. EXAM: CHEST  2 VIEW COMPARISON:  Chest CTA 10/18/2015 and earlier. FINDINGS: Sequelae of CABG. Stable mild cardiomegaly. Other mediastinal contours are within  normal limits. Visualized tracheal air column is within normal limits. Right pleural effusion appears resolved since the June comparison. Mild eventration of the right hemidiaphragm. No pneumothorax, pulmonary edema, pleural effusion or confluent pulmonary opacity. No acute osseous abnormality identified. IMPRESSION: Right pleural effusion seen in June seems resolved. No acute cardiopulmonary abnormality. Electronically Signed   By: Genevie Ann M.D.   On: 12/23/2015 19:49      Management plans discussed with the patient and he is in agreement. Stable for discharge home D/w dr Ubaldo Glassing  Patient should follow up with dr Ubaldo Glassing  CODE STATUS:     Code Status Orders        Start     Ordered   12/23/15 2246  Full code  Continuous     12/23/15 2245    Code Status History    Date Active Date Inactive Code Status Order ID Comments User Context   12/23/2015 10:46 PM 12/24/2015  8:35 AM Full Code [id number]  Lance Coon, MD Inpatient   11/03/2015  5:58 PM 11/05/2015  7:05 PM Full Code [id number]  Lytle Butte, MD ED   10/29/2015  1:08 AM 11/01/2015  4:05 PM Full Code [id number]  Alesia Richards, MD ED   12/22/2014  2:23 AM 12/24/2014  2:13 PM Full Code [id number]  Juluis Mire, MD Inpatient      TOTAL TIME TAKING CARE OF THIS PATIENT: 36 minutes.    Note: This dictation was prepared with Dragon dictation along with smaller phrase technology. Any transcriptional errors that result from this process are unintentional.  Shyquan Stallbaumer M.D on 12/24/2015 at 12:54 PM  Between 7am to 6pm - Pager - [phone] After 6pm go to [email or web address] - password [email or web address]  Office  [phone]  CC: Primary care physician; Rusty Aus, MD

## 2015-12-24 NOTE — Consult Note (Signed)
[place] CONSULT NOTE  Patient ID: Andres Gutierrez MRN: [id number] DOB/AGE: 1930-06-10 82 y.o.  Admit date: 12/23/2015 Referring Physician Dr. Benjie Karvonen Primary Physician  Dr. Rusty Aus Primary Cardiologist Dr. Ubaldo Glassing Reason for Consultation dyspnea  HPI: is a 82 year old male with history ofcoronary artery disease status post coronary artery bypass grafting a [facility] in 1984 with a right internal mammary to the RCA, left internal mammary to the first diagonal and distal LAD. He has a history of chronic atrial fibrillation treated with rate control and chronic anticoagulation with apixaban. He also has a cardiomyopathy with an ejection fraction of 25-35% chronically. He presented to the emergency room with complaints of one to 2 days of increasing dyspnea on exertion.he has not been taking a diuretic for several weeks.he is currently treated as an outpatient with metoprolol tartrate 25 twice a day. He is also on digoxin for rate control.His serum troponin has been slightly elevated but flat with no peaking at 0.06. Echocardiogram done today reveals EF of 25-30% which does not appear to be appreciably changed from echocardiogram done in June of this year. Wall motion has not pain. There is no evidence of new wall motion abnormalities. He has no significant improvement since admission late last night. He is able to lay flat in bed however. Chest x-ray revealed no significantpulmonary edema. He has no peripheral edema. He denies chest pain.  Review of Systems  Constitutional: Positive for malaise/fatigue.  HENT: Negative.   Eyes: Negative.   Respiratory: Positive for shortness of breath.   Cardiovascular: Negative.   Gastrointestinal: Negative.   Genitourinary: Negative.   Musculoskeletal: Negative.   Skin: Negative.   Neurological: Negative.   Endo/Heme/Allergies: Negative.   Psychiatric/Behavioral: Negative.     Past Medical History:  Diagnosis  Date  . Anemia   . Anxiety   . Atrial fibrillation ([place])   . Cardiomyopathy ([place])   . Chronic kidney disease    kidney stones  . Coronary artery disease   . Cough   . Dysrhythmia   . Hypertension   . Lymphadenopathy, hilar   . MI (myocardial infarction) ([place])    x 2  . Wheezing     Family History  Problem Relation Age of Onset  . CAD Mother   . Colon cancer Father     Social History   Social History  . Marital status: Widowed    Spouse name: N/A  . Number of children: N/A  . Years of education: N/A   Occupational History  . Not on file.   Social History Main Topics  . Smoking status: Former Smoker    Quit date: 06/27/1977  . Smokeless tobacco: Current User    Types: Chew  . Alcohol use Yes     Comment: occational almost rare once a year  . Drug use: No  . Sexual activity: Not on file   Other Topics Concern  . Not on file   Social History Narrative  . No narrative on file    Past Surgical History:  Procedure Laterality Date  . BRONCHIAL NEEDLE ASPIRATION BIOPSY N/A 12/31/2014   Procedure: BRONCHIAL NEEDLE ASPIRATION BIOPSIES from carina;  Surgeon: Flora Lipps, MD;  Location: [facility] ORS;  Service: Cardiopulmonary;  Laterality: N/A;  . CORONARY ANGIOPLASTY WITH STENT PLACEMENT    . CORONARY ARTERY BYPASS GRAFT    . ENDOBRONCHIAL ULTRASOUND N/A 12/31/2014   Procedure: ENDOBRONCHIAL ULTRASOUND;  Surgeon: Flora Lipps, MD;  Location: [facility] ORS;  Service: Cardiopulmonary;  Laterality: N/A;  . ESOPHAGOGASTRODUODENOSCOPY (EGD) WITH PROPOFOL N/A 06/20/2015   Procedure: ESOPHAGOGASTRODUODENOSCOPY (EGD) WITH PROPOFOL;  Surgeon: Lollie Sails, MD;  Location: [facility] ENDOSCOPY;  Service: Endoscopy;  Laterality: N/A;     Prescriptions Prior to Admission  Medication Sig Dispense Refill Last Dose  . apixaban (ELIQUIS) 5 MG TABS tablet Take 1 tablet (5 mg total) by mouth 2 (two) times daily. 60 tablet 11 12/23/2015 at 0600  . aspirin EC 81 MG tablet Take 81 mg by mouth daily.    12/23/2015 at 0600  . atorvastatin (LIPITOR) 20 MG tablet Take 20 mg by mouth at bedtime.    12/22/2015 at Unknown time  . digoxin (LANOXIN) 0.125 MG tablet Take 1 tablet (0.125 mg total) by mouth daily. 30 tablet 0 12/23/2015 at 0600  . ferrous sulfate 325 (65 FE) MG tablet Take 1 tablet (325 mg total) by mouth daily. 30 tablet 3 12/23/2015 at 0600  . furosemide (LASIX) 40 MG tablet Take 40 mg by mouth 2 (two) times a week. Pt takes on Monday and Friday.   Past Week at Unknown time  . metoprolol (LOPRESSOR) 50 MG tablet Take 1 tablet (50 mg total) by mouth 2 (two) times daily. 60 tablet 0 12/23/2015 at 0600  . mupirocin ointment (BACTROBAN) 2 % Apply 1 application topically 3 (three) times daily. 22 g 0 12/23/2015 at Unknown time  . nitroGLYCERIN (NITROSTAT) 0.4 MG SL tablet Place 0.4 mg under the tongue every 5 (five) minutes as needed for chest pain.   prn at prn  . pantoprazole (PROTONIX) 40 MG tablet Take 40 mg by mouth 2 (two) times daily before a meal.    12/23/2015 at 0600  . potassium chloride (K-DUR) 10 MEQ tablet Take 10 mEq by mouth 2 (two) times daily.   12/23/2015 at 0600  . senna (SENOKOT) 8.6 MG tablet Take 1 tablet by mouth at bedtime as needed for constipation.   12/22/2015 at Unknown time  . sucralfate (CARAFATE) 1 G tablet Take 1 g by mouth 4 (four) times daily -  with meals and at bedtime.    12/23/2015 at 0600  . tamsulosin (FLOMAX) 0.4 MG CAPS capsule Take 0.4 mg by mouth daily after supper.    12/22/2015 at Unknown time  . traMADol (ULTRAM) 50 MG tablet Take 1 tablet (50 mg total) by mouth every 6 (six) hours as needed. 30 tablet 0 prn at prn  . vitamin C (VITAMIN C) 250 MG tablet Take 1 tablet (250 mg total) by mouth daily. 30 tablet 0 12/23/2015 at 0600    Physical Exam: Blood pressure (!) 112/59, pulse 90, temperature 98.3 F (36.8 C), temperature source Oral, resp. rate 16, height 6\' 4"  (1.93 m), weight 89.7 kg (197 lb 12.8 oz), SpO2 93 %.   Wt Readings from Last 1 Encounters:   12/24/15 89.7 kg (197 lb 12.8 oz)     General appearance: alert and cooperative Head: Normocephalic, without obvious abnormality, atraumatic Resp: clear to auscultation bilaterally Chest wall: no tenderness Cardio: irregularly irregular rhythm GI: soft, non-tender; bowel sounds normal; no masses,  no organomegaly Extremities: extremities normal, atraumatic, no cyanosis or edema Pulses: 2+ and symmetric Neurologic: Grossly normal  Labs:   Lab Results  Component Value Date   WBC 7.1 12/24/2015   HGB 11.1 (L) 12/24/2015   HCT 31.1 (L) 12/24/2015   MCV 93.8 12/24/2015   PLT 189 12/24/2015    Recent Labs Lab 12/24/15 0419  NA 137  K  4.2  CL 102  CO2 27  BUN 25*  CREATININE 1.18  CALCIUM 8.7*  GLUCOSE 109*   Lab Results  Component Value Date   CKTOTAL 139 09/11/2010   CKMB 1.8 09/11/2010   TROPONINI 0.07 (HH) 12/24/2015      Radiology: CXR--Right pleural effusion seen in June seems resolved. No acute cardiopulmonary abnormality.  -EKG: atrial fibrillation with controlled vr  ASSESSMENT AND PLAN:  Patient is an 82 year old male with history of cardiomyopathy with ejection fraction 25-30%, history of coronary artery disease status post coronary artery bypass grafting done in 1982 who presented with dyspnea on exertion. Chest x-ray did not show significant pulmonary edema. He has ruled out for myocardial infarction. Cardiac exam is unchanged from one done in June of this year. He has been on digoxin and metoprolol tartrate as an outpatient for rate control and apixaban for anticoagulation of his atrial fibrillation. He has not been on furosemide for the past 2 weeks. He is also not on afterload reduction or Aldactone. This does not appear to be an acute ischemic event. Would likely resume apixaban at previous dose. Will change his metoprolol tartrate carvedilol 3.125 mg twice daily and add a low-dose ACE inhibitor as well as careful diuresis. Should he hemodynamically tolerate  this, consideration for addition of Aldactone at a later date could be raised. It changes, would recommend watching 24 hours and if stable in a.m. Consider discharge with further outpatient follow-up. Signed: Teodoro Spray MD, [facility] 12/24/2015, 9:09 AM

## 2015-12-24 NOTE — Progress Notes (Signed)
*  PRELIMINARY RESULTS* Echocardiogram 2D Echocardiogram has been performed.  Andres Gutierrez 12/24/2015, 9:06 AM

## 2015-12-24 NOTE — Discharge Instructions (Signed)
[facility] appointment on January 17, 2016 at 9:00am with Darylene Price, [place]. Please call [phone] to reschedule.

## 2015-12-24 NOTE — Progress Notes (Signed)
Pt to be discharged this afternoon. Iv and tele removed. disch instructions given to pt and daughter to their understanding. disch via w.c. Accompanied by family

## 2015-12-24 NOTE — Progress Notes (Signed)
ANTICOAGULATION CONSULT NOTE - Initial Consult  Pharmacy Consult for Heparin  Indication: chest pain/ACS  Allergies  Allergen Reactions  . Diphenhydramine Hcl Other (See Comments)    Reaction:  Rash and fever a long time ago, but has had it since with no problem.  . Escitalopram Other (See Comments)    Reaction:  Makes him feel faint, like he was going to have a heart attack.  . Maxidex [Dexamethasone] Other (See Comments)    Reaction:  Unknown   . Prednisone Other (See Comments)    Pt states that this medication made him feel crazy.    . Serotonin Other (See Comments)    Tried 2 different types, lexapro and another one.  Reaction:  Made him feel like he was having a heart attack.   . Vytorin [Ezetimibe-Simvastatin] Other (See Comments)    Reaction:  Unknown   . Zocor [Simvastatin] Other (See Comments)    Reaction:  Unknown     Patient Measurements: Height: 6\' 4"  (193 cm) Weight: 190 lb (86.2 kg) IBW/kg (Calculated) : 86.8 Heparin Dosing Weight: 86.2 kg   Vital Signs: Temp: 98 F (36.7 C) (08/11 2222) Temp Source: Oral (08/11 2222) BP: 119/60 (08/11 2222) Pulse Rate: 84 (08/11 2222)  Labs:  Recent Labs  12/23/15 1921 12/23/15 2313 12/24/15 0419  HGB 12.3*  --  11.1*  HCT 35.3*  --  31.1*  PLT 195  --  189  APTT 43*  --  100*  LABPROT 15.1  --   --   INR 1.18  --   --   HEPARINUNFRC  --   --  1.07*  CREATININE  --   --  1.18  TROPONINI 0.06* 0.07*  --     Estimated Creatinine Clearance: 58.8 mL/min (by C-G formula based on SCr of 1.18 mg/dL).   Medical History: Past Medical History:  Diagnosis Date  . Anemia   . Anxiety   . Atrial fibrillation ([place])   . Cardiomyopathy ([place])   . Chronic kidney disease    kidney stones  . Coronary artery disease   . Cough   . Dysrhythmia   . Hypertension   . Lymphadenopathy, hilar   . MI (myocardial infarction) ([place])    x 2  . Wheezing     Medications:  Prescriptions Prior to Admission  Medication Sig  Dispense Refill Last Dose  . apixaban (ELIQUIS) 5 MG TABS tablet Take 1 tablet (5 mg total) by mouth 2 (two) times daily. 60 tablet 11 12/23/2015 at 0600  . aspirin EC 81 MG tablet Take 81 mg by mouth daily.   12/23/2015 at 0600  . atorvastatin (LIPITOR) 20 MG tablet Take 20 mg by mouth at bedtime.    12/22/2015 at Unknown time  . digoxin (LANOXIN) 0.125 MG tablet Take 1 tablet (0.125 mg total) by mouth daily. 30 tablet 0 12/23/2015 at 0600  . ferrous sulfate 325 (65 FE) MG tablet Take 1 tablet (325 mg total) by mouth daily. 30 tablet 3 12/23/2015 at 0600  . furosemide (LASIX) 40 MG tablet Take 40 mg by mouth 2 (two) times a week. Pt takes on Monday and Friday.   Past Week at Unknown time  . metoprolol (LOPRESSOR) 50 MG tablet Take 1 tablet (50 mg total) by mouth 2 (two) times daily. 60 tablet 0 12/23/2015 at 0600  . mupirocin ointment (BACTROBAN) 2 % Apply 1 application topically 3 (three) times daily. 22 g 0 12/23/2015 at Unknown time  . nitroGLYCERIN (NITROSTAT) 0.4  MG SL tablet Place 0.4 mg under the tongue every 5 (five) minutes as needed for chest pain.   prn at prn  . pantoprazole (PROTONIX) 40 MG tablet Take 40 mg by mouth 2 (two) times daily before a meal.    12/23/2015 at 0600  . potassium chloride (K-DUR) 10 MEQ tablet Take 10 mEq by mouth 2 (two) times daily.   12/23/2015 at 0600  . senna (SENOKOT) 8.6 MG tablet Take 1 tablet by mouth at bedtime as needed for constipation.   12/22/2015 at Unknown time  . sucralfate (CARAFATE) 1 G tablet Take 1 g by mouth 4 (four) times daily -  with meals and at bedtime.    12/23/2015 at 0600  . tamsulosin (FLOMAX) 0.4 MG CAPS capsule Take 0.4 mg by mouth daily after supper.    12/22/2015 at Unknown time  . traMADol (ULTRAM) 50 MG tablet Take 1 tablet (50 mg total) by mouth every 6 (six) hours as needed. 30 tablet 0 prn at prn  . vitamin C (VITAMIN C) 250 MG tablet Take 1 tablet (250 mg total) by mouth daily. 30 tablet 0 12/23/2015 at 0600    Assessment: CrCl = ?   Pharmacy consulted to dose heparin in this 82 year old male admitted with ACS/NSTEMI.  Pt was on Eliquis at home, pt took last dose on 8/11 AM.   Goal of Therapy:  Heparin level 0.3-0.7 units/ml  aPTT 66-102. Monitor platelets by anticoagulation protocol: Yes   Plan:  Will not bolus this pt. Will start heparin gtt at 1100 units/hr.   Will use aPTT to dose heparin until HL and aptt levels coincide.  Will draw HL on 8/12 with AM labs.   8/12 AM heparin level 1.07, aPTT 100. Continue current regimen and recheck aPTT and heparin level with tomorrow AM labs.   Jadarion Halbig S 12/24/2015,5:02 AM

## 2015-12-26 ENCOUNTER — Encounter: Payer: Self-pay | Admitting: Emergency Medicine

## 2015-12-26 ENCOUNTER — Emergency Department: Payer: PPO

## 2015-12-26 ENCOUNTER — Inpatient Hospital Stay
Admission: EM | Admit: 2015-12-26 | Discharge: 2016-01-02 | DRG: 287 | Disposition: A | Discharge status: Skilled Nursing Facility | Payer: PPO | Attending: Internal Medicine | Admitting: Internal Medicine

## 2015-12-26 DIAGNOSIS — Z8249 Family history of ischemic heart disease and other diseases of the circulatory system: Secondary | ICD-10-CM | POA: Diagnosis not present

## 2015-12-26 DIAGNOSIS — I252 Old myocardial infarction: Secondary | ICD-10-CM

## 2015-12-26 DIAGNOSIS — M25551 Pain in right hip: Secondary | ICD-10-CM | POA: Diagnosis not present

## 2015-12-26 DIAGNOSIS — I482 Chronic atrial fibrillation: Secondary | ICD-10-CM | POA: Diagnosis not present

## 2015-12-26 DIAGNOSIS — Z7982 Long term (current) use of aspirin: Secondary | ICD-10-CM

## 2015-12-26 DIAGNOSIS — Z7901 Long term (current) use of anticoagulants: Secondary | ICD-10-CM

## 2015-12-26 DIAGNOSIS — I5022 Chronic systolic (congestive) heart failure: Secondary | ICD-10-CM | POA: Diagnosis not present

## 2015-12-26 DIAGNOSIS — I25110 Atherosclerotic heart disease of native coronary artery with unstable angina pectoris: Secondary | ICD-10-CM | POA: Diagnosis not present

## 2015-12-26 DIAGNOSIS — R7989 Other specified abnormal findings of blood chemistry: Secondary | ICD-10-CM

## 2015-12-26 DIAGNOSIS — Z800 Family history of malignant neoplasm of digestive organs: Secondary | ICD-10-CM | POA: Diagnosis not present

## 2015-12-26 DIAGNOSIS — I110 Hypertensive heart disease with heart failure: Secondary | ICD-10-CM | POA: Diagnosis present

## 2015-12-26 DIAGNOSIS — Z955 Presence of coronary angioplasty implant and graft: Secondary | ICD-10-CM | POA: Diagnosis not present

## 2015-12-26 DIAGNOSIS — Z79899 Other long term (current) drug therapy: Secondary | ICD-10-CM

## 2015-12-26 DIAGNOSIS — I480 Paroxysmal atrial fibrillation: Secondary | ICD-10-CM | POA: Diagnosis present

## 2015-12-26 DIAGNOSIS — R1110 Vomiting, unspecified: Secondary | ICD-10-CM

## 2015-12-26 DIAGNOSIS — E785 Hyperlipidemia, unspecified: Secondary | ICD-10-CM | POA: Diagnosis present

## 2015-12-26 DIAGNOSIS — Z87442 Personal history of urinary calculi: Secondary | ICD-10-CM

## 2015-12-26 DIAGNOSIS — M79604 Pain in right leg: Secondary | ICD-10-CM | POA: Diagnosis not present

## 2015-12-26 DIAGNOSIS — R079 Chest pain, unspecified: Secondary | ICD-10-CM

## 2015-12-26 DIAGNOSIS — I429 Cardiomyopathy, unspecified: Secondary | ICD-10-CM | POA: Diagnosis not present

## 2015-12-26 DIAGNOSIS — R0602 Shortness of breath: Secondary | ICD-10-CM | POA: Diagnosis not present

## 2015-12-26 DIAGNOSIS — I2582 Chronic total occlusion of coronary artery: Secondary | ICD-10-CM | POA: Diagnosis not present

## 2015-12-26 DIAGNOSIS — R778 Other specified abnormalities of plasma proteins: Secondary | ICD-10-CM | POA: Diagnosis present

## 2015-12-26 DIAGNOSIS — I25700 Atherosclerosis of coronary artery bypass graft(s), unspecified, with unstable angina pectoris: Secondary | ICD-10-CM | POA: Diagnosis not present

## 2015-12-26 DIAGNOSIS — Z9189 Other specified personal risk factors, not elsewhere classified: Secondary | ICD-10-CM

## 2015-12-26 DIAGNOSIS — I200 Unstable angina: Secondary | ICD-10-CM | POA: Diagnosis not present

## 2015-12-26 DIAGNOSIS — K219 Gastro-esophageal reflux disease without esophagitis: Secondary | ICD-10-CM | POA: Diagnosis not present

## 2015-12-26 DIAGNOSIS — I4891 Unspecified atrial fibrillation: Secondary | ICD-10-CM | POA: Diagnosis not present

## 2015-12-26 DIAGNOSIS — R0789 Other chest pain: Secondary | ICD-10-CM | POA: Diagnosis not present

## 2015-12-26 DIAGNOSIS — F17220 Nicotine dependence, chewing tobacco, uncomplicated: Secondary | ICD-10-CM | POA: Diagnosis not present

## 2015-12-26 LAB — COMPREHENSIVE METABOLIC PANEL
ALT: 19 U/L (ref 17–63)
ANION GAP: 7 (ref 5–15)
AST: 30 U/L (ref 15–41)
Albumin: 3.6 g/dL (ref 3.5–5.0)
Alkaline Phosphatase: 75 U/L (ref 38–126)
BUN: 27 mg/dL — ABNORMAL HIGH (ref 6–20)
CHLORIDE: 105 mmol/L (ref 101–111)
CO2: 24 mmol/L (ref 22–32)
Calcium: 9.1 mg/dL (ref 8.9–10.3)
Creatinine, Ser: 1.18 mg/dL (ref 0.61–1.24)
GFR calc non Af Amer: 56 mL/min — ABNORMAL LOW (ref >60)
Glucose, Bld: 111 mg/dL — ABNORMAL HIGH (ref 65–99)
POTASSIUM: 3.7 mmol/L (ref 3.5–5.1)
SODIUM: 136 mmol/L (ref 135–145)
Total Bilirubin: 1.1 mg/dL (ref 0.3–1.2)
Total Protein: 6.9 g/dL (ref 6.5–8.1)

## 2015-12-26 LAB — TROPONIN I
TROPONIN I: 0.04 ng/mL — AB (ref <0.03)
Troponin I: 0.05 ng/mL (ref <0.03)
Troponin I: 0.05 ng/mL (ref <0.03)

## 2015-12-26 LAB — CBC
HCT: 34.4 % — ABNORMAL LOW (ref 40.0–52.0)
HEMOGLOBIN: 12.1 g/dL — AB (ref 13.0–18.0)
MCH: 32.8 pg (ref 26.0–34.0)
MCHC: 35.2 g/dL (ref 32.0–36.0)
MCV: 93.1 fL (ref 80.0–100.0)
Platelets: 228 10*3/uL (ref 150–440)
RBC: 3.69 MIL/uL — ABNORMAL LOW (ref 4.40–5.90)
RDW: 17 % — AB (ref 11.5–14.5)
WBC: 9.4 10*3/uL (ref 3.8–10.6)

## 2015-12-26 LAB — LIPID PANEL
CHOL/HDL RATIO: 5.6 ratio
Cholesterol: 117 mg/dL (ref 0–200)
HDL: 21 mg/dL — AB (ref >40)
LDL CALC: 80 mg/dL (ref 0–99)
Triglycerides: 78 mg/dL (ref <150)
VLDL: 16 mg/dL (ref 0–40)

## 2015-12-26 LAB — PROTIME-INR
INR: 1.34
Prothrombin Time: 16.7 seconds — ABNORMAL HIGH (ref 11.4–15.2)

## 2015-12-26 LAB — APTT: aPTT: 42 seconds — ABNORMAL HIGH (ref 24–36)

## 2015-12-26 LAB — HEMOGLOBIN A1C: Hgb A1c MFr Bld: 5.7 % (ref 4.0–6.0)

## 2015-12-26 LAB — HEPARIN LEVEL (UNFRACTIONATED): HEPARIN UNFRACTIONATED: 3.38 [IU]/mL — AB (ref 0.30–0.70)

## 2015-12-26 MED ORDER — SENNOSIDES 8.6 MG PO TABS
1.0000 | ORAL_TABLET | Freq: Every evening | ORAL | Status: DC | PRN
  Filled 2015-12-26: qty 1

## 2015-12-26 MED ORDER — ACETAMINOPHEN 325 MG PO TABS: 650.0000 mg | ORAL_TABLET | Freq: Four times a day (QID) | ORAL | Status: DC | PRN

## 2015-12-26 MED ORDER — SODIUM CHLORIDE 0.9% FLUSH
3.0000 mL | Freq: Two times a day (BID) | INTRAVENOUS | Status: DC
  Administered 2015-12-26 – 2015-12-28 (×2): 3 mL via INTRAVENOUS

## 2015-12-26 MED ORDER — POTASSIUM CHLORIDE ER 10 MEQ PO TBCR
10.0000 meq | EXTENDED_RELEASE_TABLET | Freq: Two times a day (BID) | ORAL | Status: DC
  Administered 2015-12-26 – 2016-01-02 (×14): 10 meq via ORAL
  Filled 2015-12-26 (×30): qty 1

## 2015-12-26 MED ORDER — ATORVASTATIN CALCIUM 20 MG PO TABS
20.0000 mg | ORAL_TABLET | Freq: Every day | ORAL | Status: DC
  Administered 2015-12-26 – 2015-12-28 (×3): 20 mg via ORAL
  Filled 2015-12-26 (×4): qty 1

## 2015-12-26 MED ORDER — PANTOPRAZOLE SODIUM 40 MG PO TBEC
40.0000 mg | DELAYED_RELEASE_TABLET | Freq: Two times a day (BID) | ORAL | Status: DC
  Administered 2015-12-26 – 2016-01-01 (×10): 40 mg via ORAL
  Filled 2015-12-26 (×11): qty 1

## 2015-12-26 MED ORDER — SODIUM CHLORIDE 0.9% FLUSH
3.0000 mL | Freq: Two times a day (BID) | INTRAVENOUS | Status: DC
  Administered 2015-12-26: 3 mL via INTRAVENOUS

## 2015-12-26 MED ORDER — ONDANSETRON HCL 4 MG/2ML IJ SOLN
4.0000 mg | Freq: Four times a day (QID) | INTRAMUSCULAR | Status: DC | PRN
  Administered 2016-01-01: 4 mg via INTRAVENOUS
  Filled 2015-12-26: qty 2

## 2015-12-26 MED ORDER — VITAMIN C 500 MG PO TABS
250.0000 mg | ORAL_TABLET | Freq: Every day | ORAL | Status: DC
  Administered 2015-12-26 – 2016-01-02 (×8): 250 mg via ORAL
  Filled 2015-12-26 (×8): qty 1

## 2015-12-26 MED ORDER — FERROUS SULFATE 325 (65 FE) MG PO TABS
325.0000 mg | ORAL_TABLET | Freq: Every day | ORAL | Status: DC
  Administered 2015-12-27 – 2016-01-02 (×7): 325 mg via ORAL
  Filled 2015-12-26 (×7): qty 1

## 2015-12-26 MED ORDER — HYDROCODONE-ACETAMINOPHEN 5-325 MG PO TABS
1.0000 | ORAL_TABLET | ORAL | Status: DC | PRN
  Administered 2016-01-02: 1 via ORAL
  Filled 2015-12-26: qty 1

## 2015-12-26 MED ORDER — MUPIROCIN 2 % EX OINT
1.0000 "application " | TOPICAL_OINTMENT | Freq: Three times a day (TID) | CUTANEOUS | Status: DC
  Administered 2015-12-28 – 2016-01-02 (×10): 1 via TOPICAL
  Filled 2015-12-26: qty 22

## 2015-12-26 MED ORDER — CARVEDILOL 3.125 MG PO TABS
3.1250 mg | ORAL_TABLET | Freq: Two times a day (BID) | ORAL | Status: DC
  Administered 2015-12-26 – 2015-12-30 (×6): 3.125 mg via ORAL
  Filled 2015-12-26 (×7): qty 1

## 2015-12-26 MED ORDER — HEPARIN (PORCINE) IN NACL 100-0.45 UNIT/ML-% IJ SOLN
1100.0000 [IU]/h | INTRAMUSCULAR | Status: DC
Start: 2015-12-26 — End: 2015-12-29
  Administered 2015-12-26 – 2015-12-27 (×2): 1100 [IU]/h via INTRAVENOUS
  Filled 2015-12-26 (×5): qty 250

## 2015-12-26 MED ORDER — FUROSEMIDE 20 MG PO TABS
20.0000 mg | ORAL_TABLET | Freq: Every day | ORAL | Status: DC
  Administered 2015-12-27 – 2015-12-30 (×4): 20 mg via ORAL
  Filled 2015-12-26 (×4): qty 1

## 2015-12-26 MED ORDER — NITROGLYCERIN 0.4 MG SL SUBL: 0.4000 mg | SUBLINGUAL_TABLET | SUBLINGUAL | Status: DC | PRN

## 2015-12-26 MED ORDER — SUCRALFATE 1 G PO TABS
1.0000 g | ORAL_TABLET | Freq: Three times a day (TID) | ORAL | Status: DC
  Administered 2015-12-26 – 2016-01-02 (×24): 1 g via ORAL
  Filled 2015-12-26 (×25): qty 1

## 2015-12-26 MED ORDER — SODIUM CHLORIDE 0.9% FLUSH: 3.0000 mL | INTRAVENOUS | Status: DC | PRN

## 2015-12-26 MED ORDER — ACETAMINOPHEN 650 MG RE SUPP: 650.0000 mg | Freq: Four times a day (QID) | RECTAL | Status: DC | PRN

## 2015-12-26 MED ORDER — LISINOPRIL 5 MG PO TABS
2.5000 mg | ORAL_TABLET | Freq: Every day | ORAL | Status: DC
  Administered 2015-12-27 – 2015-12-30 (×4): 2.5 mg via ORAL
  Filled 2015-12-26 (×4): qty 1

## 2015-12-26 MED ORDER — ONDANSETRON HCL 4 MG PO TABS: 4.0000 mg | ORAL_TABLET | Freq: Four times a day (QID) | ORAL | Status: DC | PRN

## 2015-12-26 MED ORDER — SENNOSIDES-DOCUSATE SODIUM 8.6-50 MG PO TABS: 1.0000 | ORAL_TABLET | Freq: Every evening | ORAL | Status: DC | PRN

## 2015-12-26 MED ORDER — DIGOXIN 125 MCG PO TABS
0.1250 mg | ORAL_TABLET | Freq: Every day | ORAL | Status: DC
  Administered 2015-12-27 – 2015-12-30 (×4): 0.125 mg via ORAL
  Filled 2015-12-26 (×4): qty 1

## 2015-12-26 MED ORDER — ASPIRIN EC 81 MG PO TBEC
81.0000 mg | DELAYED_RELEASE_TABLET | Freq: Every day | ORAL | Status: DC
  Administered 2015-12-27 – 2016-01-02 (×6): 81 mg via ORAL
  Filled 2015-12-26 (×7): qty 1

## 2015-12-26 MED ORDER — TAMSULOSIN HCL 0.4 MG PO CAPS
0.4000 mg | ORAL_CAPSULE | Freq: Every day | ORAL | Status: DC
  Administered 2015-12-26 – 2016-01-01 (×5): 0.4 mg via ORAL
  Filled 2015-12-26 (×6): qty 1

## 2015-12-26 MED ORDER — SODIUM CHLORIDE 0.9 % IV SOLN: 250.0000 mL | INTRAVENOUS | Status: DC | PRN

## 2015-12-26 NOTE — ED Notes (Addendum)
Caryl Pina from [facility] called regarding pt; states that the nurse who is taking this pt is unavailable for report and asked that we wait a few minutes to call report. Bed shows assigned, not ready, so told her I would wait as I could.

## 2015-12-26 NOTE — ED Notes (Signed)
Pt from home with chest pain x 2 mornings. States when he awakened yesterday morning and walked around, he had chest pain upon exertion. He then went to his recliner and spent the day there without pain. Reports the pain returned this morning when he got up. He took 2 nitroglycerin tablets and experienced relief. Pt states he has had sob and some sweating with the pain.

## 2015-12-26 NOTE — ED Triage Notes (Signed)
Patient presents to the ED via [facility] from home.  Patient complaining of left sided chest pain radiating into back that was relieved after taking 2 nitro.  Patient is currently in Afib.  Per EMS systolic bp was in the 0000000, and blood sugar was 135.  Patient appears slightly pale.  Patient is complaining of feeling weak and tired.

## 2015-12-26 NOTE — ED Provider Notes (Signed)
[facility] Emergency Department Provider Note        Time seen: ----------------------------------------- 9:30 AM on 12/26/2015 -----------------------------------------    I have reviewed the triage vital signs and the nursing notes.   HISTORY  Chief Complaint Chest Pain    HPI Andres Gutierrez is a 82 y.o. male who presents to the ER for chest pain.Patient states he's having left-sided dull chest pain that he was not having while he was in the hospital. Patient was just discharged from the hospital 2 days ago for dyspnea on exertion and cardiomyopathy related symptoms. Patient states he took 2 nitroglycerin prior to arrival with resolution in his chest pain. Patient states his been years since his last heart catheter, ambulation makes his symptoms worse.   Past Medical History:  Diagnosis Date  . Anemia   . Anxiety   . Atrial fibrillation ([place])   . Cardiomyopathy ([place])   . Chronic kidney disease    kidney stones  . Coronary artery disease   . Cough   . Dysrhythmia   . Hypertension   . Lymphadenopathy, hilar   . MI (myocardial infarction) ([place])    x 2  . Wheezing     Patient Active Problem List   Diagnosis Date Noted  . Atrial fibrillation with RVR ([place]) 12/23/2015  . Dyspnea on exertion 12/23/2015  . Elevated troponin 12/23/2015  . GERD (gastroesophageal reflux disease) 12/23/2015  . Other iron deficiency anemias 12/20/2015  . Femoral neck fracture, left, closed, initial encounter 11/03/2015  . Chronic atrial fibrillation ([place])   . Coronary artery disease involving native coronary artery of native heart without angina pectoris   . Acalculous cholecystitis 10/29/2015  . Adenopathy   . CAD (coronary artery disease) 12/22/2014  . HTN (hypertension) 12/22/2014    Past Surgical History:  Procedure Laterality Date  . BRONCHIAL NEEDLE ASPIRATION BIOPSY N/A 12/31/2014   Procedure: BRONCHIAL NEEDLE ASPIRATION BIOPSIES from carina;  Surgeon:  Flora Lipps, MD;  Location: [facility] ORS;  Service: Cardiopulmonary;  Laterality: N/A;  . CORONARY ANGIOPLASTY WITH STENT PLACEMENT    . CORONARY ARTERY BYPASS GRAFT    . ENDOBRONCHIAL ULTRASOUND N/A 12/31/2014   Procedure: ENDOBRONCHIAL ULTRASOUND;  Surgeon: Flora Lipps, MD;  Location: [facility] ORS;  Service: Cardiopulmonary;  Laterality: N/A;  . ESOPHAGOGASTRODUODENOSCOPY (EGD) WITH PROPOFOL N/A 06/20/2015   Procedure: ESOPHAGOGASTRODUODENOSCOPY (EGD) WITH PROPOFOL;  Surgeon: Lollie Sails, MD;  Location: [facility] ENDOSCOPY;  Service: Endoscopy;  Laterality: N/A;    Allergies Diphenhydramine hcl; Escitalopram; Maxidex [dexamethasone]; Prednisone; Serotonin; Vytorin [ezetimibe-simvastatin]; and Zocor [simvastatin]  Social History Social History  Substance Use Topics  . Smoking status: Former Smoker    Quit date: 06/27/1977  . Smokeless tobacco: Current User    Types: Chew  . Alcohol use Yes     Comment: occational almost rare once a year    Review of Systems Constitutional: Negative for fever. Cardiovascular: Positive for chest pain Respiratory: Positive for dyspnea on exertion Gastrointestinal: Negative for abdominal pain, vomiting or diarrhea Genitourinary: Negative for dysuria. Musculoskeletal: Negative for back pain. Skin: Negative for rash. Neurological: Negative for headaches, focal weakness or numbness.  10-point ROS otherwise negative.  ____________________________________________   PHYSICAL EXAM:  VITAL SIGNS: ED Triage Vitals  Enc Vitals Group     BP      Pulse      Resp      Temp      Temp src      SpO2      Weight  Height      Head Circumference      Peak Flow      Pain Score      Pain Loc      Pain Edu?      Excl. in [place]?     Constitutional: Alert and oriented. Well appearing and in no distress. Eyes: Conjunctivae are normal. PERRL. Normal extraocular movements. ENT   Head: Normocephalic and atraumatic.   Nose: No congestion/rhinnorhea.    Mouth/Throat: Mucous membranes are moist.   Neck: No stridor. Cardiovascular: Irregularly irregular rhythm. Slight systolic murmur is noted Respiratory: Normal respiratory effort without tachypnea nor retractions. Breath sounds are clear and equal bilaterally. No wheezes/rales/rhonchi. Gastrointestinal: Soft and nontender. Normal bowel sounds Musculoskeletal: Nontender with normal range of motion in all extremities. No lower extremity tenderness nor edema. Neurologic:  Normal speech and language. No gross focal neurologic deficits are appreciated.  Skin:  Skin is warm, dry and intact. No rash noted. Psychiatric: Mood and affect are normal. Speech and behavior are normal.  ____________________________________________  EKG: Interpreted by me. Atrial fibrillation with rapid ventricular response, rate is 110 bpm, normal QRS size, normal QT interval. Normal axis. Possible septal infarct age indeterminate  ____________________________________________  ED COURSE:  Pertinent labs & imaging results that were available during my care of the patient were reviewed by me and considered in my medical decision making (see chart for details). Clinical Course  Patient presents with chest pain that he has not had recently and he was recently admitted the hospital for dyspnea on exertion. He will likely need heart catheterization.  Procedures ____________________________________________   LABS (pertinent positives/negatives)  Labs Reviewed  CBC - Abnormal; Notable for the following:       Result Value   RBC 3.69 (*)    Hemoglobin 12.1 (*)    HCT 34.4 (*)    RDW 17.0 (*)    All other components within normal limits  TROPONIN I - Abnormal; Notable for the following:    Troponin I 0.04 (*)    All other components within normal limits  COMPREHENSIVE METABOLIC PANEL - Abnormal; Notable for the following:    Glucose, Bld 111 (*)    BUN 27 (*)    GFR calc non Af Amer 56 (*)    All other components  within normal limits    RADIOLOGY  Chest x-ray IMPRESSION: Stable postoperative chest.  No acute cardiopulmonary process.  ____________________________________________  FINAL ASSESSMENT AND PLAN  Chest pain  Plan: Patient with labs and imaging as dictated above. Patient presents to ER for chest pain relieved with nitroglycerin while he was at rest. Patient states his symptoms are new, I will discuss with the hospitalist, he will likely need heart catheterization   Earleen Newport, MD   Note: This dictation was prepared with Dragon dictation. Any transcriptional errors that result from this process are unintentional    Earleen Newport, MD 12/26/15 1035

## 2015-12-26 NOTE — Consult Note (Addendum)
ANTICOAGULATION CONSULT NOTE - Initial Consult  Pharmacy Consult for heparin drip Indication: chest pain/ACS  Allergies  Allergen Reactions  . Diphenhydramine Hcl Other (See Comments)    Reaction:  Rash and fever a long time ago, but has had it since with no problem.  . Escitalopram Other (See Comments)    Reaction:  Makes him feel faint, like he was going to have a heart attack.  . Maxidex [Dexamethasone] Other (See Comments)    Reaction:  Unknown   . Prednisone Other (See Comments)    Pt states that this medication made him feel crazy.    . Serotonin Other (See Comments)    Tried 2 different types, lexapro and another one.  Reaction:  Made him feel like he was having a heart attack.   . Vytorin [Ezetimibe-Simvastatin] Other (See Comments)    Reaction:  Unknown   . Zocor [Simvastatin] Other (See Comments)    Reaction:  Unknown     Patient Measurements: Height: 6\' 4"  (193 cm) Weight: 197 lb (89.4 kg) IBW/kg (Calculated) : 86.8 Heparin Dosing Weight: 89.4kg  Vital Signs: Temp: 98.5 F (36.9 C) (08/14 1335) Temp Source: Oral (08/14 1335) BP: 122/67 (08/14 1335) Pulse Rate: 41 (08/14 1335)  Labs:  Recent Labs  12/23/15 1921  12/24/15 0419 12/24/15 1042 12/26/15 0951  HGB 12.3*  --  11.1*  --  12.1*  HCT 35.3*  --  31.1*  --  34.4*  PLT 195  --  189  --  228  APTT 43*  --  100* 111*  --   LABPROT 15.1  --   --   --   --   INR 1.18  --   --   --   --   HEPARINUNFRC  --   --  1.07*  --   --   CREATININE  --   --  1.18  --  1.18  TROPONINI 0.06*  < > 0.07* 0.07* 0.04*  < > = values in this interval not displayed.  Estimated Creatinine Clearance: 59.3 mL/min (by C-G formula based on SCr of 1.18 mg/dL).   Medical History: Past Medical History:  Diagnosis Date  . Anemia   . Anxiety   . Atrial fibrillation ([place])   . Cardiomyopathy ([place])   . Chronic kidney disease    kidney stones  . Coronary artery disease   . Cough   . Dysrhythmia   . Hypertension   .  Lymphadenopathy, hilar   . MI (myocardial infarction) ([place])    x 2  . Wheezing     Medications:  Scheduled:  . aspirin EC  81 mg Oral Daily  . atorvastatin  20 mg Oral QHS  . carvedilol  3.125 mg Oral BID WC  . [START ON 12/27/2015] digoxin  0.125 mg Oral Daily  . [START ON 12/27/2015] ferrous sulfate  325 mg Oral Daily  . [START ON 12/27/2015] furosemide  20 mg Oral Daily  . [START ON 12/27/2015] lisinopril  2.5 mg Oral Daily  . mupirocin ointment  1 application Topical TID  . pantoprazole  40 mg Oral BID AC  . potassium chloride  10 mEq Oral BID  . sodium chloride flush  3 mL Intravenous Q12H  . sodium chloride flush  3 mL Intravenous Q12H  . sucralfate  1 g Oral TID WC & HS  . tamsulosin  0.4 mg Oral QPC supper  . ascorbic acid  250 mg Oral Daily    Assessment: Pt is a  82 year old male who presents with chest pain and mild elevated troponin. Pharmacy consulted to transition pt to heparin drip. Pt is on apixaban at home. Last dose was at 0630 this AM per med rec. Baseline INR, HL, APTT, and CBC have been ordered. Will need to dose off of APTT until HL and APTT correlate.  Will start heparin drip at 1830, 12 hours after last apixaban dose.  Goal of Therapy:  Heparin level 0.3-0.7 units/ml aPTT 66-102 seconds Monitor platelets by anticoagulation protocol: Yes   Plan:  Start heparin infusion at 1050 units/hr Check anti-Xa level in 8 hours and daily while on heparin Continue to monitor H&H and platelets  Melissa D Maccia, Pharm.D Clinical Pharmacist  12/26/2015,2:19 PM   [phone] 12/27/15 aPTT therapeutic x 1. Continue current rate. Will recheck aPTT in 6 hours.  Roger Fasnacht A. [place]ron, [place].D., BCPS Clinical Pharmacist [phone] 12/27/15

## 2015-12-26 NOTE — H&P (Addendum)
[facility] NAME: Andres Gutierrez    MR#:  [id number]  DATE OF BIRTH:  1930-12-20  DATE OF ADMISSION:  12/26/2015  PRIMARY CARE PHYSICIAN: Rusty Aus, MD   REQUESTING/REFERRING PHYSICIAN:  Dr Jimmye Norman  CHIEF COMPLAINT:    Chest pain with dizziness and diaphoresis HISTORY OF PRESENT ILLNESS:  Andres Gutierrez  is a 82 y.o. male with a known history of Chronic systolic heart failure EF of 25-30% and ASCVD who presents with above complaint. Patient was recently discharged after hospital stay of dyspnea. He was found to have flat troponins not indicative of ACS. He was evaluated by cardiology at that time. Echocardiogram at that time showed ejection fraction of 20-30% which is unchanged from previous echo cardiac exam. Patient reports that yesterday he had 2 episodes of diaphoresis on exertion which was relieved with rest. He also had left sided chest pain that was associated with the diaphoresis and dizziness. She had one more episode this morning of similar symptoms. He was brought to the ER for further evaluation. In the emergency room troponin was very minimally elevated at 0.04. EKG shows atrial fibrillation heart rate 110. Q waves in the septal leads.  PAST MEDICAL HISTORY:   Past Medical History:  Diagnosis Date  . Anemia   . Anxiety   . Atrial fibrillation ([place])   . Cardiomyopathy ([place])   . Chronic kidney disease    kidney stones  . Coronary artery disease   . Cough   . Dysrhythmia   . Hypertension   . Lymphadenopathy, hilar   . MI (myocardial infarction) ([place])    x 2  . Wheezing     PAST SURGICAL HISTORY:   Past Surgical History:  Procedure Laterality Date  . BRONCHIAL NEEDLE ASPIRATION BIOPSY N/A 12/31/2014   Procedure: BRONCHIAL NEEDLE ASPIRATION BIOPSIES from carina;  Surgeon: Flora Lipps, MD;  Location: [facility] ORS;  Service: Cardiopulmonary;  Laterality: N/A;  . CORONARY ANGIOPLASTY WITH STENT PLACEMENT    . CORONARY  ARTERY BYPASS GRAFT    . ENDOBRONCHIAL ULTRASOUND N/A 12/31/2014   Procedure: ENDOBRONCHIAL ULTRASOUND;  Surgeon: Flora Lipps, MD;  Location: [facility] ORS;  Service: Cardiopulmonary;  Laterality: N/A;  . ESOPHAGOGASTRODUODENOSCOPY (EGD) WITH PROPOFOL N/A 06/20/2015   Procedure: ESOPHAGOGASTRODUODENOSCOPY (EGD) WITH PROPOFOL;  Surgeon: Lollie Sails, MD;  Location: [facility] ENDOSCOPY;  Service: Endoscopy;  Laterality: N/A;    SOCIAL HISTORY:   Social History  Substance Use Topics  . Smoking status: Former Smoker    Quit date: 06/27/1977  . Smokeless tobacco: Current User    Types: Chew  . Alcohol use Yes     Comment: occational almost rare once a year    FAMILY HISTORY:   Family History  Problem Relation Age of Onset  . CAD Mother   . Colon cancer Father     DRUG ALLERGIES:   Allergies  Allergen Reactions  . Diphenhydramine Hcl Other (See Comments)    Reaction:  Rash and fever a long time ago, but has had it since with no problem.  . Escitalopram Other (See Comments)    Reaction:  Makes him feel faint, like he was going to have a heart attack.  . Maxidex [Dexamethasone] Other (See Comments)    Reaction:  Unknown   . Prednisone Other (See Comments)    Pt states that this medication made him feel crazy.    . Serotonin Other (See Comments)    Tried 2 different types, lexapro  and another one.  Reaction:  Made him feel like he was having a heart attack.   . Vytorin [Ezetimibe-Simvastatin] Other (See Comments)    Reaction:  Unknown   . Zocor [Simvastatin] Other (See Comments)    Reaction:  Unknown     REVIEW OF SYSTEMS:   Review of Systems  Constitutional: Negative.  Negative for chills, fever and malaise/fatigue.  HENT: Negative.  Negative for ear discharge, ear pain, hearing loss, nosebleeds and sore throat.   Eyes: Negative.  Negative for blurred vision and pain.  Respiratory: Negative.  Negative for cough, hemoptysis, shortness of breath and wheezing.   Cardiovascular:  Positive for chest pain. Negative for palpitations and leg swelling.       DOE  Gastrointestinal: Negative.  Negative for abdominal pain, blood in stool, diarrhea, nausea and vomiting.  Genitourinary: Negative.  Negative for dysuria.  Musculoskeletal: Negative.  Negative for back pain.  Skin: Negative.   Neurological: Negative for dizziness, tremors, speech change, focal weakness, seizures and headaches.  Endo/Heme/Allergies: Negative.  Does not bruise/bleed easily.  Psychiatric/Behavioral: Negative.  Negative for depression, hallucinations and suicidal ideas.    MEDICATIONS AT HOME:   Prior to Admission medications   Medication Sig Start Date End Date Taking? Authorizing Provider  apixaban (ELIQUIS) 5 MG TABS tablet Take 1 tablet (5 mg total) by mouth 2 (two) times daily. 12/23/14   Rusty Aus, MD  aspirin EC 81 MG tablet Take 81 mg by mouth daily.    Historical Provider, MD  atorvastatin (LIPITOR) 20 MG tablet Take 20 mg by mouth at bedtime.     Historical Provider, MD  carvedilol (COREG) 3.125 MG tablet Take 1 tablet (3.125 mg total) by mouth 2 (two) times daily with a meal. 12/24/15   Bettey Costa, MD  digoxin (LANOXIN) 0.125 MG tablet Take 1 tablet (0.125 mg total) by mouth daily. 11/01/15   Dustin Flock, MD  ferrous sulfate 325 (65 FE) MG tablet Take 1 tablet (325 mg total) by mouth daily. 11/01/15   Dustin Flock, MD  furosemide (LASIX) 20 MG tablet Take 1 tablet (20 mg total) by mouth daily. 12/24/15   Bettey Costa, MD  lisinopril (PRINIVIL,ZESTRIL) 2.5 MG tablet Take 1 tablet (2.5 mg total) by mouth daily. 12/24/15   Bettey Costa, MD  mupirocin ointment (BACTROBAN) 2 % Apply 1 application topically 3 (three) times daily. 12/17/15   Frederich Cha, MD  nitroGLYCERIN (NITROSTAT) 0.4 MG SL tablet Place 0.4 mg under the tongue every 5 (five) minutes as needed for chest pain.    Historical Provider, MD  pantoprazole (PROTONIX) 40 MG tablet Take 40 mg by mouth 2 (two) times daily before a meal.      Historical Provider, MD  potassium chloride (K-DUR) 10 MEQ tablet Take 10 mEq by mouth 2 (two) times daily.    Historical Provider, MD  senna (SENOKOT) 8.6 MG tablet Take 1 tablet by mouth at bedtime as needed for constipation.    Historical Provider, MD  sucralfate (CARAFATE) 1 G tablet Take 1 g by mouth 4 (four) times daily -  with meals and at bedtime.     Historical Provider, MD  tamsulosin (FLOMAX) 0.4 MG CAPS capsule Take 0.4 mg by mouth daily after supper.     Historical Provider, MD  traMADol (ULTRAM) 50 MG tablet Take 1 tablet (50 mg total) by mouth every 6 (six) hours as needed. 11/05/15 11/04/16  Henreitta Leber, MD  vitamin C (VITAMIN C) 250 MG tablet Take 1  tablet (250 mg total) by mouth daily. 11/01/15   Dustin Flock, MD      VITAL SIGNS:  Blood pressure 116/65, pulse (!) 104, temperature 97.6 F (36.4 C), temperature source Oral, resp. rate (!) 28, height 6\' 4"  (1.93 m), weight 89.4 kg (197 lb), SpO2 98 %.  PHYSICAL EXAMINATION:   Physical Exam  Constitutional: He is oriented to person, place, and time and well-developed, well-nourished, and in no distress. No distress.  HENT:  Head: Normocephalic.  Eyes: No scleral icterus.  Neck: Normal range of motion. Neck supple. No JVD present. No tracheal deviation present.  Cardiovascular: Exam reveals no gallop and no friction rub.   Murmur heard. Irr, irr  Pulmonary/Chest: Effort normal and breath sounds normal. No respiratory distress. He has no wheezes. He has no rales. He exhibits no tenderness.  Abdominal: Soft. Bowel sounds are normal. He exhibits no distension and no mass. There is no tenderness. There is no rebound and no guarding.  Musculoskeletal: Normal range of motion. He exhibits no edema.  Neurological: He is alert and oriented to person, place, and time.  Skin: Skin is warm. No rash noted. No erythema.  Psychiatric: Affect and judgment normal.      LABORATORY PANEL:   CBC  Recent Labs Lab 12/26/15 0951   WBC 9.4  HGB 12.1*  HCT 34.4*  PLT 228   ------------------------------------------------------------------------------------------------------------------  Chemistries   Recent Labs Lab 12/26/15 0951  NA 136  K 3.7  CL 105  CO2 24  GLUCOSE 111*  BUN 27*  CREATININE 1.18  CALCIUM 9.1  AST 30  ALT 19  ALKPHOS 75  BILITOT 1.1   ------------------------------------------------------------------------------------------------------------------  Cardiac Enzymes  Recent Labs Lab 12/26/15 0951  TROPONINI 0.04*   ------------------------------------------------------------------------------------------------------------------  RADIOLOGY:  Dg Chest Port 1 View  Result Date: 12/26/2015 CLINICAL DATA:  Shortness of breath for several days with dizziness. History of atrial fibrillation, CAD and CABG. EXAM: PORTABLE CHEST 1 VIEW COMPARISON:  12/23/2015 radiographs.  CT 10/28/2015 FINDINGS: 0936 hours. Stable mild cardiac enlargement status post CABG. The pulmonary vascularity is normal. There is no edema, confluent airspace opacity, pleural effusion or pneumothorax. Old left-sided rib fracture noted. No acute osseous findings are seen. IMPRESSION: Stable postoperative chest.  No acute cardiopulmonary process. Electronically Signed   By: Richardean Sale M.D.   On: 12/26/2015 10:08    EKG:  Atrial fibrillation Q waves in septal leads  IMPRESSION AND PLAN:   82 year old male with a history of chronic atrial fibrillation on anticoagulation, ASCVD and chronic systolic heart failure who presents with unstable angina.  1. Unstable angina with minimal elevation in troponin not yet diagnosed as an ST elevation MI: Patient will be admitted to the hospital service. I will discontinue Eliquis in its place he will started on heparin drip. He will be evaluated cardiology.  We will continue Coreg and statin therapy, in addition to aspirin. Trend troponins, first minimally admitted. Check  lipid panel.  2. Chronic atrial fibrillation: Continue Coreg, digoxin for heart rate control. Plan for anticoagulation as above.  3. Chronic systolic heart failure EF of 25-30%: Continue Lasix, lisinopril and Coreg.  4. Hyperlipidemia: Continue Lipitor.  5. ASCVD: Continue atorvastatin, aspirin, lisinopril and Coreg     All the records are reviewed and case discussed with ED provider. Management plans discussed with the patient and he in agreement  CODE STATUS: full  TOTAL TIME TAKING CARE OF THIS PATIENT: 55 minutes.    Katina Remick M.D on 12/26/2015 at 11:29  AM  Between 7am to 6pm - Pager - [phone]  After 6pm go to [email or web address] - password [email or web address] [facility]  Office  [phone]  CC: Primary care physician; Rusty Aus, MD

## 2015-12-27 ENCOUNTER — Encounter
Admission: EM | Disposition: A | Discharge status: Skilled Nursing Facility | Payer: Self-pay | Source: Home / Self Care | Attending: Internal Medicine

## 2015-12-27 DIAGNOSIS — I200 Unstable angina: Secondary | ICD-10-CM | POA: Diagnosis not present

## 2015-12-27 DIAGNOSIS — E785 Hyperlipidemia, unspecified: Secondary | ICD-10-CM | POA: Diagnosis not present

## 2015-12-27 DIAGNOSIS — I4891 Unspecified atrial fibrillation: Secondary | ICD-10-CM | POA: Diagnosis not present

## 2015-12-27 DIAGNOSIS — I5022 Chronic systolic (congestive) heart failure: Secondary | ICD-10-CM | POA: Diagnosis not present

## 2015-12-27 LAB — CBC
HEMATOCRIT: 33.8 % — AB (ref 40.0–52.0)
Hemoglobin: 11.8 g/dL — ABNORMAL LOW (ref 13.0–18.0)
MCH: 32.8 pg (ref 26.0–34.0)
MCHC: 34.8 g/dL (ref 32.0–36.0)
MCV: 94.3 fL (ref 80.0–100.0)
Platelets: 216 10*3/uL (ref 150–440)
RBC: 3.58 MIL/uL — ABNORMAL LOW (ref 4.40–5.90)
RDW: 17.1 % — AB (ref 11.5–14.5)
WBC: 8.1 10*3/uL (ref 3.8–10.6)

## 2015-12-27 LAB — TROPONIN I: Troponin I: 0.05 ng/mL (ref <0.03)

## 2015-12-27 LAB — HEPARIN LEVEL (UNFRACTIONATED): HEPARIN UNFRACTIONATED: 2.32 [IU]/mL — AB (ref 0.30–0.70)

## 2015-12-27 LAB — APTT
APTT: 100 s — AB (ref 24–36)
aPTT: 98 seconds — ABNORMAL HIGH (ref 24–36)

## 2015-12-27 SURGERY — LEFT HEART CATH AND CORONARY ANGIOGRAPHY
Anesthesia: Moderate Sedation

## 2015-12-27 MED ORDER — SODIUM CHLORIDE 0.9 % WEIGHT BASED INFUSION
1.0000 mL/kg/h | INTRAVENOUS | Status: DC
  Administered 2015-12-28 (×2): 1 mL/kg/h via INTRAVENOUS

## 2015-12-27 MED ORDER — ASPIRIN 81 MG PO CHEW
81.0000 mg | CHEWABLE_TABLET | ORAL | Status: AC
  Administered 2015-12-28: 81 mg via ORAL
  Filled 2015-12-27: qty 1

## 2015-12-27 MED ORDER — SODIUM CHLORIDE 0.9 % WEIGHT BASED INFUSION
3.0000 mL/kg/h | INTRAVENOUS | Status: AC
  Administered 2015-12-28: 3 mL/kg/h via INTRAVENOUS

## 2015-12-27 NOTE — Care Management (Signed)
Recently at [facility] 8/11-08/12 with dyspnea on exertion. Readmitted with recurrent chest pain and afib. Heparin gtt. For cath tomorrow. Met with patient who lives at home with his wife. He states he is followed by [facility]. TC to Jason with [facility] to inquire further.  Jason states patient is followed by PT and [facility]. Denies issues obtaining medications, copays or transportation.  

## 2015-12-27 NOTE — Progress Notes (Signed)
[place] at [facility]                                                                                                                                                                                            Patient Demographics   Andres Gutierrez, is a 82 y.o. male, DOB - Dec 09, 1930, [id number]  Admit date - 12/26/2015   Admitting Physician Bettey Costa, MD  Outpatient Primary MD for the patient is Rusty Aus, MD   LOS - 1  Subjective: Patient admitted with recurrent chest pain and A. fib. He was just discharged 2 days prior. Patient currently not complaining of chest pain.     Review of Systems:   CONSTITUTIONAL: No documented fever. No fatigue, weakness. No weight gain, no weight loss.  EYES: No blurry or double vision.  ENT: No tinnitus. No postnasal drip. No redness of the oropharynx.  RESPIRATORY: No cough, no wheeze, no hemoptysis. No dyspnea.  CARDIOVASCULAR:Positive chest pain. No orthopnea. No palpitations. No syncope.  GASTROINTESTINAL: No nausea, no vomiting or diarrhea. No abdominal pain. No melena or hematochezia.  GENITOURINARY: No dysuria or hematuria.  ENDOCRINE: No polyuria or nocturia. No heat or cold intolerance.  HEMATOLOGY: No anemia. No bruising. No bleeding.  INTEGUMENTARY: No rashes. No lesions.  MUSCULOSKELETAL: No arthritis. No swelling. No gout.  NEUROLOGIC: No numbness, tingling, or ataxia. No seizure-type activity.  PSYCHIATRIC: No anxiety. No insomnia. No ADD.    Vitals:   Vitals:   12/27/15 0416 12/27/15 0728 12/27/15 1058 12/27/15 1144  BP: 115/62 122/71  105/60  Pulse: 84 86 83 75  Resp: 18 (!) 24  20  Temp: 97.7 F (36.5 C) 98 F (36.7 C)  97.9 F (36.6 C)  TempSrc: Oral Oral  Oral  SpO2: 96% 94%  93%  Weight: 88.1 kg (194 lb 4.8 oz)     Height:        Wt Readings from Last 3 Encounters:  12/27/15 88.1 kg (194 lb 4.8 oz)  12/24/15 89.7 kg (197 lb 12.8 oz)  12/17/15 86.2 kg (190 lb)      Intake/Output Summary (Last 24 hours) at 12/27/15 1342 Last data filed at 12/27/15 1141  Gross per 24 hour  Intake              480 ml  Output             3155 ml  Net            -2675 ml    Physical Exam:   GENERAL: Pleasant-appearing in no apparent distress.  HEAD, EYES, EARS, NOSE AND  THROAT: Atraumatic, normocephalic. Extraocular muscles are intact. Pupils equal and reactive to light. Sclerae anicteric. No conjunctival injection. No oro-pharyngeal erythema.  NECK: Supple. There is no jugular venous distention. No bruits, no lymphadenopathy, no thyromegaly.  HEART: Regular rate and rhythm,. No murmurs, no rubs, no clicks.  LUNGS: Clear to auscultation bilaterally. No rales or rhonchi. No wheezes.  ABDOMEN: Soft, flat, nontender, nondistended. Has good bowel sounds. No hepatosplenomegaly appreciated.  EXTREMITIES: No evidence of any cyanosis, clubbing, or peripheral edema.  +2 pedal and radial pulses bilaterally.  NEUROLOGIC: The patient is alert, awake, and oriented x3 with no focal motor or sensory deficits appreciated bilaterally.  SKIN: Moist and warm with no rashes appreciated.  Psych: Not anxious, depressed LN: No inguinal LN enlargement    Antibiotics   Anti-infectives    None      Medications   Scheduled Meds: . aspirin EC  81 mg Oral Daily  . atorvastatin  20 mg Oral QHS  . carvedilol  3.125 mg Oral BID WC  . digoxin  0.125 mg Oral Daily  . ferrous sulfate  325 mg Oral Daily  . furosemide  20 mg Oral Daily  . lisinopril  2.5 mg Oral Daily  . mupirocin ointment  1 application Topical TID  . pantoprazole  40 mg Oral BID AC  . potassium chloride  10 mEq Oral BID  . sodium chloride flush  3 mL Intravenous Q12H  . sodium chloride flush  3 mL Intravenous Q12H  . sucralfate  1 g Oral TID WC & HS  . tamsulosin  0.4 mg Oral QPC supper  . ascorbic acid  250 mg Oral Daily   Continuous Infusions: . heparin 1,100 Units/hr (12/26/15 2009)   PRN Meds:.sodium  chloride, acetaminophen **OR** acetaminophen, HYDROcodone-acetaminophen, nitroGLYCERIN, ondansetron **OR** ondansetron (ZOFRAN) IV, senna, senna-docusate, sodium chloride flush   Data Review:   Micro Results No results found for this or any previous visit (from the past 240 hour(s)).  Radiology Reports Dg Chest 2 View  Result Date: 12/23/2015 CLINICAL DATA:  82 year old male with shortness of breath since this morning. Initial encounter. Former smoker. EXAM: CHEST  2 VIEW COMPARISON:  Chest CTA 10/18/2015 and earlier. FINDINGS: Sequelae of CABG. Stable mild cardiomegaly. Other mediastinal contours are within normal limits. Visualized tracheal air column is within normal limits. Right pleural effusion appears resolved since the June comparison. Mild eventration of the right hemidiaphragm. No pneumothorax, pulmonary edema, pleural effusion or confluent pulmonary opacity. No acute osseous abnormality identified. IMPRESSION: Right pleural effusion seen in June seems resolved. No acute cardiopulmonary abnormality. Electronically Signed   By: Genevie Ann M.D.   On: 12/23/2015 19:49   Dg Chest Port 1 View  Result Date: 12/26/2015 CLINICAL DATA:  Shortness of breath for several days with dizziness. History of atrial fibrillation, CAD and CABG. EXAM: PORTABLE CHEST 1 VIEW COMPARISON:  12/23/2015 radiographs.  CT 10/28/2015 FINDINGS: 0936 hours. Stable mild cardiac enlargement status post CABG. The pulmonary vascularity is normal. There is no edema, confluent airspace opacity, pleural effusion or pneumothorax. Old left-sided rib fracture noted. No acute osseous findings are seen. IMPRESSION: Stable postoperative chest.  No acute cardiopulmonary process. Electronically Signed   By: Richardean Sale M.D.   On: 12/26/2015 10:08   Dg Foot Complete Right  Result Date: 12/17/2015 CLINICAL DATA:  Swelling with no known cause. EXAM: RIGHT FOOT COMPLETE - 3+ VIEW COMPARISON:  None. FINDINGS: Healed distal second  metatarsal fracture with an overlapping screw. Unusual configuration to the proximal  second phalanx has a chronic appearance and is likely from previous trauma. Subluxation at the second MTP joint, likely from remote trauma. No other acute bony abnormalities are identified. Diffuse soft tissue swelling is seen with no underlying cause identified. No bony erosion. IMPRESSION: Soft tissue swelling. No acute abnormalities. Remote posttraumatic changes as detailed above. Electronically Signed   By: Dorise Bullion III M.D   On: 12/17/2015 10:46     CBC  Recent Labs Lab 12/23/15 1921 12/24/15 0419 12/26/15 0951 12/27/15 0341  WBC 8.1 7.1 9.4 8.1  HGB 12.3* 11.1* 12.1* 11.8*  HCT 35.3* 31.1* 34.4* 33.8*  PLT 195 189 228 216  MCV 98.7 93.8 93.1 94.3  MCH 34.5* 33.4 32.8 32.8  MCHC 35.0 35.7 35.2 34.8  RDW 17.2* 16.7* 17.0* 17.1*    Chemistries   Recent Labs Lab 12/24/15 0419 12/26/15 0951  NA 137 136  K 4.2 3.7  CL 102 105  CO2 27 24  GLUCOSE 109* 111*  BUN 25* 27*  CREATININE 1.18 1.18  CALCIUM 8.7* 9.1  AST  --  30  ALT  --  19  ALKPHOS  --  75  BILITOT  --  1.1   ------------------------------------------------------------------------------------------------------------------ estimated creatinine clearance is 59.3 mL/min (by C-G formula based on SCr of 1.18 mg/dL). ------------------------------------------------------------------------------------------------------------------  Recent Labs  12/26/15 1356  HGBA1C 5.7   ------------------------------------------------------------------------------------------------------------------  Recent Labs  12/26/15 1356  CHOL 117  HDL 21*  LDLCALC 80  TRIG 78  CHOLHDL 5.6   ------------------------------------------------------------------------------------------------------------------ No results for input(s): TSH, T4TOTAL, T3FREE, THYROIDAB in the last 72 hours.  Invalid input(s):  FREET3 ------------------------------------------------------------------------------------------------------------------ No results for input(s): VITAMINB12, FOLATE, FERRITIN, TIBC, IRON, RETICCTPCT in the last 72 hours.  Coagulation profile  Recent Labs Lab 12/23/15 1921 12/26/15 1426  INR 1.18 1.34    No results for input(s): DDIMER in the last 72 hours.  Cardiac Enzymes  Recent Labs Lab 12/26/15 1356 12/26/15 1902 12/27/15 0120  TROPONINI 0.05* 0.05* 0.05*   ------------------------------------------------------------------------------------------------------------------ Invalid input(s): POCBNP    Assessment & Plan   82 year old male with a history of chronic atrial fibrillation on anticoagulation, ASCVD and chronic systolic heart failure who presents with unstable angina.  1. Unstable angina Cardiology has been consult that plan for catheter tomorrow  2. Chronic atrial fibrillation: Continue Coreg, digoxin for heart rate control. Heparin for cath  3. Chronic systolic heart failure EF of 25-30%: Continue Lasix, lisinopril and Coreg.  4. Hyperlipidemia: Continue Lipitor.  5. ASCVD: Continue atorvastatin, aspirin, lisinopril and Coreg     Code Status Orders        Start     Ordered   12/26/15 1317  Full code  Continuous     12/26/15 1317    Code Status History    Date Active Date Inactive Code Status Order ID Comments User Context   12/23/2015 10:46 PM 12/24/2015  8:35 AM Full Code [id number]  Lance Coon, MD Inpatient   11/03/2015  5:58 PM 11/05/2015  7:05 PM Full Code [id number]  Lytle Butte, MD ED   10/29/2015  1:08 AM 11/01/2015  4:05 PM Full Code [id number]  Alesia Richards, MD ED   12/22/2014  2:23 AM 12/24/2014  2:13 PM Full Code [id number]  Juluis Mire, MD Inpatient           Consults  2min   DVT Prophylaxis  heparin  Lab Results  Component Value Date   PLT 216 12/27/2015     Time Spent in minutes  90min  Greater than 50% of  time spent in care coordination and counseling patient regarding the condition and plan of care.   Dustin Flock M.D on 12/27/2015 at 1:42 PM  Between 7am to 6pm - Pager - [phone]  After 6pm go to [email or web address] - password [email or web address] [facility]   Office  [phone]

## 2015-12-27 NOTE — Progress Notes (Signed)
[facility]  Patient Status: Active  [facility] is providing the following services: PT/HHA  If patient discharges after hours, please call [phone].   Andres Gutierrez 12/27/2015, 3:04 PM

## 2015-12-27 NOTE — Consult Note (Addendum)
ANTICOAGULATION CONSULT NOTE - Initial Consult  Pharmacy Consult for heparin drip Indication: chest pain/ACS  Allergies  Allergen Reactions  . Diphenhydramine Hcl Other (See Comments)    Reaction:  Rash and fever a long time ago, but has had it since with no problem.  . Escitalopram Other (See Comments)    Reaction:  Makes him feel faint, like he was going to have a heart attack.  . Maxidex [Dexamethasone] Other (See Comments)    Reaction:  Unknown   . Prednisone Other (See Comments)    Pt states that this medication made him feel crazy.    . Serotonin Other (See Comments)    Tried 2 different types, lexapro and another one.  Reaction:  Made him feel like he was having a heart attack.   . Vytorin [Ezetimibe-Simvastatin] Other (See Comments)    Reaction:  Unknown   . Zocor [Simvastatin] Other (See Comments)    Reaction:  Unknown     Patient Measurements: Height: 6\' 4"  (193 cm) Weight: 194 lb 4.8 oz (88.1 kg) IBW/kg (Calculated) : 86.8 Heparin Dosing Weight: 89.4kg  Vital Signs: Temp: 98.1 F (36.7 C) (08/15 1630) Temp Source: Oral (08/15 1630) BP: 116/67 (08/15 1630) Pulse Rate: 83 (08/15 1630)  Labs:  Recent Labs  12/26/15 0951 12/26/15 1356 12/26/15 1426 12/26/15 1902 12/27/15 0120 12/27/15 0341 12/27/15 1028  HGB 12.1*  --   --   --   --  11.8*  --   HCT 34.4*  --   --   --   --  33.8*  --   PLT 228  --   --   --   --  216  --   APTT  --   --  42*  --   --  100* 98*  LABPROT  --   --  16.7*  --   --   --   --   INR  --   --  1.34  --   --   --   --   HEPARINUNFRC  --   --  3.38*  --   --  2.32*  --   CREATININE 1.18  --   --   --   --   --   --   TROPONINI 0.04* 0.05*  --  0.05* 0.05*  --   --     Estimated Creatinine Clearance: 59.3 mL/min (by C-G formula based on SCr of 1.18 mg/dL).   Medical History: Past Medical History:  Diagnosis Date  . Anemia   . Anxiety   . Atrial fibrillation ([place])   . Cardiomyopathy ([place])   . Chronic kidney disease    kidney stones  . Coronary artery disease   . Cough   . Dysrhythmia   . Hypertension   . Lymphadenopathy, hilar   . MI (myocardial infarction) ([place])    x 2  . Wheezing     Medications:  Scheduled:  . aspirin EC  81 mg Oral Daily  . atorvastatin  20 mg Oral QHS  . carvedilol  3.125 mg Oral BID WC  . digoxin  0.125 mg Oral Daily  . ferrous sulfate  325 mg Oral Daily  . furosemide  20 mg Oral Daily  . lisinopril  2.5 mg Oral Daily  . mupirocin ointment  1 application Topical TID  . pantoprazole  40 mg Oral BID AC  . potassium chloride  10 mEq Oral BID  . sodium chloride flush  3 mL Intravenous Q12H  .  sodium chloride flush  3 mL Intravenous Q12H  . sucralfate  1 g Oral TID WC & HS  . tamsulosin  0.4 mg Oral QPC supper  . ascorbic acid  250 mg Oral Daily    Assessment: Pt is a 82 year old male who presents with chest pain and mild elevated troponin. Pharmacy consulted to transition pt to heparin drip. Pt is on apixaban at home. Last dose was at 0630 this AM per med rec. Baseline INR, HL, APTT, and CBC have been ordered. Will need to dose off of APTT until HL and APTT correlate.  Will start heparin drip at 1830, 12 hours after last apixaban dose.  8/15 0341 aPTT 100 8/15 0600 apTT 98  Goal of Therapy:  Heparin level 0.3-0.7 units/ml aPTT 66-102 seconds Monitor platelets by anticoagulation protocol: Yes   Plan:  Will continue with Heparin 1100units/hr and recheck aPTT and Heparin level with 8/16 AM labs.  Paulina Fusi, PharmD, BCPS 12/27/2015 4:42 PM   [id number] 12/28/2015 aPTT therapeutic HL supratherapeutic. Continue current rate. Pharmacy will continue to monitor daily. Bonni Neuser A. [place], [place].D., BCPS

## 2015-12-27 NOTE — Progress Notes (Signed)
Eliquis 5 mg taken yesterday am at around 0700.

## 2015-12-27 NOTE — Consult Note (Signed)
Reason for Consult: Unstable angina chest pain dyspnea Referring Physician: Fritzi Mandes hospitalist Cardiologist Dr. Geraldo Pitter Andres Gutierrez is an 82 y.o. male.  HPI: Patient really presents to the hospital from the recent admission where he went home 2448 hrs. ago. Complains again of left sided axilla discomfort. Patient has known coronary disease coronary bypass years ago he's had worsening dyspnea with minimal activity over the last few months. Dyspnea is mostly with exertion none really rests. Patient was recently seen by cardiologist Dr. Ubaldo Glassing who adjusted his medications for cardiomyopathy heart failure. Patient was found to have borderline troponins but with his persistent left-sided pain dyspnea and recent hospitalization he's been a value by cardiology for possible invasive strategy.  Past Medical History:  Diagnosis Date  . Anemia   . Anxiety   . Atrial fibrillation ([place])   . Cardiomyopathy ([place])   . Chronic kidney disease    kidney stones  . Coronary artery disease   . Cough   . Dysrhythmia   . Hypertension   . Lymphadenopathy, hilar   . MI (myocardial infarction) ([place])    x 2  . Wheezing     Past Surgical History:  Procedure Laterality Date  . BRONCHIAL NEEDLE ASPIRATION BIOPSY N/A 12/31/2014   Procedure: BRONCHIAL NEEDLE ASPIRATION BIOPSIES from carina;  Surgeon: Flora Lipps, MD;  Location: [facility] ORS;  Service: Cardiopulmonary;  Laterality: N/A;  . CORONARY ANGIOPLASTY WITH STENT PLACEMENT    . CORONARY ARTERY BYPASS GRAFT    . ENDOBRONCHIAL ULTRASOUND N/A 12/31/2014   Procedure: ENDOBRONCHIAL ULTRASOUND;  Surgeon: Flora Lipps, MD;  Location: [facility] ORS;  Service: Cardiopulmonary;  Laterality: N/A;  . ESOPHAGOGASTRODUODENOSCOPY (EGD) WITH PROPOFOL N/A 06/20/2015   Procedure: ESOPHAGOGASTRODUODENOSCOPY (EGD) WITH PROPOFOL;  Surgeon: Lollie Sails, MD;  Location: [facility] ENDOSCOPY;  Service: Endoscopy;  Laterality: N/A;    Family History  Problem Relation Age of Onset  . CAD  Mother   . Colon cancer Father     Social History:  reports that he quit smoking about 38 years ago. His smokeless tobacco use includes Chew. He reports that he drinks alcohol. He reports that he does not use drugs.  Allergies:  Allergies  Allergen Reactions  . Diphenhydramine Hcl Other (See Comments)    Reaction:  Rash and fever a long time ago, but has had it since with no problem.  . Escitalopram Other (See Comments)    Reaction:  Makes him feel faint, like he was going to have a heart attack.  . Maxidex [Dexamethasone] Other (See Comments)    Reaction:  Unknown   . Prednisone Other (See Comments)    Pt states that this medication made him feel crazy.    . Serotonin Other (See Comments)    Tried 2 different types, lexapro and another one.  Reaction:  Made him feel like he was having a heart attack.   . Vytorin [Ezetimibe-Simvastatin] Other (See Comments)    Reaction:  Unknown   . Zocor [Simvastatin] Other (See Comments)    Reaction:  Unknown     Medications: I have reviewed the patient's current medications.  Results for orders placed or performed during the hospital encounter of 12/26/15 (from the past 48 hour(s))  CBC     Status: Abnormal   Collection Time: 12/26/15  9:51 AM  Result Value Ref Range   WBC 9.4 3.8 - 10.6 K/uL   RBC 3.69 (L) 4.40 - 5.90 MIL/uL   Hemoglobin 12.1 (L) 13.0 - 18.0 g/dL  HCT 34.4 (L) 40.0 - 52.0 %   MCV 93.1 80.0 - 100.0 fL   MCH 32.8 26.0 - 34.0 pg   MCHC 35.2 32.0 - 36.0 g/dL   RDW 17.0 (H) 11.5 - 14.5 %   Platelets 228 150 - 440 K/uL  Troponin I     Status: Abnormal   Collection Time: 12/26/15  9:51 AM  Result Value Ref Range   Troponin I 0.04 (HH) <0.03 ng/mL    Comment: CRITICAL RESULT CALLED TO, READ BACK BY AND VERIFIED WITH MARY NEEDHAM AT 1025 ON 12/26/15.Marland KitchenMarland KitchenNorthwest [facility] Center   Comprehensive metabolic panel     Status: Abnormal   Collection Time: 12/26/15  9:51 AM  Result Value Ref Range   Sodium 136 135 - 145 mmol/L   Potassium 3.7 3.5 -  5.1 mmol/L   Chloride 105 101 - 111 mmol/L   CO2 24 22 - 32 mmol/L   Glucose, Bld 111 (H) 65 - 99 mg/dL   BUN 27 (H) 6 - 20 mg/dL   Creatinine, Ser 1.18 0.61 - 1.24 mg/dL   Calcium 9.1 8.9 - 10.3 mg/dL   Total Protein 6.9 6.5 - 8.1 g/dL   Albumin 3.6 3.5 - 5.0 g/dL   AST 30 15 - 41 U/L   ALT 19 17 - 63 U/L   Alkaline Phosphatase 75 38 - 126 U/L   Total Bilirubin 1.1 0.3 - 1.2 mg/dL   GFR calc non Af Amer 56 (L) >60 mL/min   GFR calc Af Amer >60 >60 mL/min    Comment: (NOTE) The eGFR has been calculated using the CKD EPI equation. This calculation has not been validated in all clinical situations. eGFR's persistently <60 mL/min signify possible Chronic Kidney Disease.    Anion gap 7 5 - 15  Troponin I     Status: Abnormal   Collection Time: 12/26/15  1:56 PM  Result Value Ref Range   Troponin I 0.05 (HH) <0.03 ng/mL    Comment: CRITICAL VALUE NOTED. VALUE IS CONSISTENT WITH PREVIOUSLY REPORTED/CALLED VALUE.  TFK  Hemoglobin A1c     Status: None   Collection Time: 12/26/15  1:56 PM  Result Value Ref Range   Hgb A1c MFr Bld 5.7 4.0 - 6.0 %  Lipid panel     Status: Abnormal   Collection Time: 12/26/15  1:56 PM  Result Value Ref Range   Cholesterol 117 0 - 200 mg/dL   Triglycerides 78 <150 mg/dL   HDL 21 (L) >40 mg/dL   Total CHOL/HDL Ratio 5.6 RATIO   VLDL 16 0 - 40 mg/dL   LDL Cholesterol 80 0 - 99 mg/dL    Comment:        Total Cholesterol/HDL:CHD Risk Coronary Heart Disease Risk Table                     Men   Women  1/2 Average Risk   3.4   3.3  Average Risk       5.0   4.4  2 X Average Risk   9.6   7.1  3 X Average Risk  23.4   11.0        Use the calculated Patient Ratio above and the CHD Risk Table to determine the patient's CHD Risk.        ATP III CLASSIFICATION (LDL):  <100     mg/dL   Optimal  100-129  mg/dL   Near or Above  Optimal  130-159  mg/dL   Borderline  160-189  mg/dL   High  >190     mg/dL   Very High   Heparin level  (unfractionated)     Status: Abnormal   Collection Time: 12/26/15  2:26 PM  Result Value Ref Range   Heparin Unfractionated 3.38 (H) 0.30 - 0.70 IU/mL    Comment: RESULTS CONFIRMED BY MANUAL DILUTION        IF HEPARIN RESULTS ARE BELOW EXPECTED VALUES, AND PATIENT DOSAGE HAS BEEN CONFIRMED, SUGGEST FOLLOW UP TESTING OF ANTITHROMBIN III LEVELS.   APTT     Status: Abnormal   Collection Time: 12/26/15  2:26 PM  Result Value Ref Range   aPTT 42 (H) 24 - 36 seconds    Comment:        IF BASELINE aPTT IS ELEVATED, SUGGEST PATIENT RISK ASSESSMENT BE USED TO DETERMINE APPROPRIATE ANTICOAGULANT THERAPY.   Protime-INR     Status: Abnormal   Collection Time: 12/26/15  2:26 PM  Result Value Ref Range   Prothrombin Time 16.7 (H) 11.4 - 15.2 seconds   INR 1.34   Troponin I     Status: Abnormal   Collection Time: 12/26/15  7:02 PM  Result Value Ref Range   Troponin I 0.05 (HH) <0.03 ng/mL    Comment: CRITICAL VALUE NOTED. VALUE IS CONSISTENT WITH PREVIOUSLY REPORTED/CALLED VALUE.  TFK  Troponin I     Status: Abnormal   Collection Time: 12/27/15  1:20 AM  Result Value Ref Range   Troponin I 0.05 (HH) <0.03 ng/mL    Comment: CRITICAL VALUE NOTED. VALUE IS CONSISTENT WITH PREVIOUSLY REPORTED/CALLED VALUE  APTT     Status: Abnormal   Collection Time: 12/27/15  3:41 AM  Result Value Ref Range   aPTT 100 (H) 24 - 36 seconds    Comment:        IF BASELINE aPTT IS ELEVATED, SUGGEST PATIENT RISK ASSESSMENT BE USED TO DETERMINE APPROPRIATE ANTICOAGULANT THERAPY.   Heparin level (unfractionated)     Status: Abnormal   Collection Time: 12/27/15  3:41 AM  Result Value Ref Range   Heparin Unfractionated 2.32 (H) 0.30 - 0.70 IU/mL    Comment: RESULTS CONFIRMED BY MANUAL DILUTION        IF HEPARIN RESULTS ARE BELOW EXPECTED VALUES, AND PATIENT DOSAGE HAS BEEN CONFIRMED, SUGGEST FOLLOW UP TESTING OF ANTITHROMBIN III LEVELS.   CBC     Status: Abnormal   Collection Time: 12/27/15  3:41 AM   Result Value Ref Range   WBC 8.1 3.8 - 10.6 K/uL   RBC 3.58 (L) 4.40 - 5.90 MIL/uL   Hemoglobin 11.8 (L) 13.0 - 18.0 g/dL   HCT 33.8 (L) 40.0 - 52.0 %   MCV 94.3 80.0 - 100.0 fL   MCH 32.8 26.0 - 34.0 pg   MCHC 34.8 32.0 - 36.0 g/dL   RDW 17.1 (H) 11.5 - 14.5 %   Platelets 216 150 - 440 K/uL    Dg Chest Port 1 View  Result Date: 12/26/2015 CLINICAL DATA:  Shortness of breath for several days with dizziness. History of atrial fibrillation, CAD and CABG. EXAM: PORTABLE CHEST 1 VIEW COMPARISON:  12/23/2015 radiographs.  CT 10/28/2015 FINDINGS: 0936 hours. Stable mild cardiac enlargement status post CABG. The pulmonary vascularity is normal. There is no edema, confluent airspace opacity, pleural effusion or pneumothorax. Old left-sided rib fracture noted. No acute osseous findings are seen. IMPRESSION: Stable postoperative chest.  No acute cardiopulmonary process. Electronically Signed  By: Richardean Sale M.D.   On: 12/26/2015 10:08    Review of Systems  Constitutional: Positive for malaise/fatigue and weight loss.  HENT: Positive for congestion.   Eyes: Negative.   Respiratory: Positive for shortness of breath.   Cardiovascular: Positive for chest pain, orthopnea and leg swelling.  Gastrointestinal: Negative.   Genitourinary: Negative.   Musculoskeletal: Negative.   Skin: Negative.   Neurological: Positive for weakness and headaches.  Endo/Heme/Allergies: Negative.   Psychiatric/Behavioral: Negative.    Blood pressure 122/71, pulse 86, temperature 98 F (36.7 C), temperature source Oral, resp. rate (!) 24, height _0  (1.93 m), weight 88.1 kg (194 lb 4.8 oz), SpO2 94 %. Physical Exam  Nursing note and vitals reviewed. Constitutional: He is oriented to person, place, and time. He appears well-developed and well-nourished.  HENT:  Head: Normocephalic and atraumatic.  Eyes: Conjunctivae and EOM are normal. Pupils are equal, round, and reactive to light.  Neck: Normal range of  motion. Neck supple.  Cardiovascular: Normal rate, regular rhythm and normal heart sounds.   Respiratory: Effort normal and breath sounds normal.  GI: Soft. Bowel sounds are normal.  Musculoskeletal: Normal range of motion.  Neurological: He is alert and oriented to person, place, and time. He has normal reflexes.  Skin: Skin is warm and dry.  Psychiatric: He has a normal mood and affect.    Assessment/Plan: Angina Chest pain Hypertension CABG CAD Borderline troponins Atrial fibrillation Dyspnea on exertion GERD . PLAN Agree with rule out for myocardial infarction on telemetry Short-term anticoagulation Hold Eliquis  prior to possible cardiac cath Continue rate control for atrial fibrillation Elevated troponin possibly demand ischemia Continue current medications for reflux Agree with hypertension control with lisinopril Continue Lipitor for lipid management Consider cardiac cath tomorrow 48 hours since the last Eliquis Recommend cardiac cath within 24 hours Case discussed with Dr. Ubaldo Glassing and daughter    Lujean Amel D. 12/27/2015, 10:35 AM

## 2015-12-28 ENCOUNTER — Encounter
Admission: EM | Disposition: A | Discharge status: Skilled Nursing Facility | Payer: Self-pay | Source: Home / Self Care | Attending: Internal Medicine

## 2015-12-28 DIAGNOSIS — Z9189 Other specified personal risk factors, not elsewhere classified: Secondary | ICD-10-CM | POA: Diagnosis not present

## 2015-12-28 DIAGNOSIS — I4891 Unspecified atrial fibrillation: Secondary | ICD-10-CM | POA: Diagnosis not present

## 2015-12-28 DIAGNOSIS — I5022 Chronic systolic (congestive) heart failure: Secondary | ICD-10-CM | POA: Diagnosis not present

## 2015-12-28 DIAGNOSIS — E785 Hyperlipidemia, unspecified: Secondary | ICD-10-CM | POA: Diagnosis not present

## 2015-12-28 DIAGNOSIS — I2510 Atherosclerotic heart disease of native coronary artery without angina pectoris: Secondary | ICD-10-CM | POA: Diagnosis not present

## 2015-12-28 DIAGNOSIS — I200 Unstable angina: Secondary | ICD-10-CM | POA: Diagnosis not present

## 2015-12-28 HISTORY — PX: CARDIAC CATHETERIZATION: SHX172

## 2015-12-28 LAB — CBC
HCT: 39.2 % — ABNORMAL LOW (ref 40.0–52.0)
HEMOGLOBIN: 13.3 g/dL (ref 13.0–18.0)
MCH: 32.3 pg (ref 26.0–34.0)
MCHC: 33.8 g/dL (ref 32.0–36.0)
MCV: 95.5 fL (ref 80.0–100.0)
Platelets: 263 10*3/uL (ref 150–440)
RBC: 4.1 MIL/uL — AB (ref 4.40–5.90)
RDW: 17.3 % — ABNORMAL HIGH (ref 11.5–14.5)
WBC: 11.2 10*3/uL — ABNORMAL HIGH (ref 3.8–10.6)

## 2015-12-28 LAB — APTT: aPTT: 86 seconds — ABNORMAL HIGH (ref 24–36)

## 2015-12-28 LAB — HEPARIN LEVEL (UNFRACTIONATED): Heparin Unfractionated: 1.31 IU/mL — ABNORMAL HIGH (ref 0.30–0.70)

## 2015-12-28 LAB — PROTIME-INR
INR: 1.06
Prothrombin Time: 13.8 seconds (ref 11.4–15.2)

## 2015-12-28 SURGERY — LEFT HEART CATH AND CORS/GRAFTS ANGIOGRAPHY
Anesthesia: Moderate Sedation

## 2015-12-28 MED ORDER — SODIUM CHLORIDE 0.9% FLUSH: 3.0000 mL | INTRAVENOUS | Status: DC | PRN

## 2015-12-28 MED ORDER — MIDAZOLAM HCL 2 MG/2ML IJ SOLN
INTRAMUSCULAR | Status: AC
  Filled 2015-12-28: qty 2

## 2015-12-28 MED ORDER — IOPAMIDOL (ISOVUE-300) INJECTION 61%
INTRAVENOUS | Status: DC | PRN
  Administered 2015-12-28: 165 mL via INTRA_ARTERIAL

## 2015-12-28 MED ORDER — ONDANSETRON HCL 4 MG/2ML IJ SOLN: 4.0000 mg | Freq: Four times a day (QID) | INTRAMUSCULAR | Status: DC | PRN

## 2015-12-28 MED ORDER — SODIUM CHLORIDE 0.9% FLUSH
3.0000 mL | Freq: Two times a day (BID) | INTRAVENOUS | Status: DC
  Administered 2015-12-28 – 2016-01-02 (×9): 3 mL via INTRAVENOUS

## 2015-12-28 MED ORDER — ACETAMINOPHEN 325 MG PO TABS: 650.0000 mg | ORAL_TABLET | ORAL | Status: DC | PRN

## 2015-12-28 MED ORDER — HEPARIN (PORCINE) IN NACL 2-0.9 UNIT/ML-% IJ SOLN
INTRAMUSCULAR | Status: AC
  Filled 2015-12-28: qty 500

## 2015-12-28 MED ORDER — SODIUM CHLORIDE 0.9 % IV SOLN: 250.0000 mL | INTRAVENOUS | Status: DC | PRN

## 2015-12-28 MED ORDER — SODIUM CHLORIDE 0.9 % WEIGHT BASED INFUSION
1.0000 mL/kg/h | INTRAVENOUS | Status: AC
  Administered 2015-12-28: 1 mL/kg/h via INTRAVENOUS

## 2015-12-28 MED ORDER — FENTANYL CITRATE (PF) 100 MCG/2ML IJ SOLN
INTRAMUSCULAR | Status: AC
Start: 2015-12-28 — End: 2015-12-28
  Filled 2015-12-28: qty 2

## 2015-12-28 MED ORDER — MIDAZOLAM HCL 2 MG/2ML IJ SOLN
INTRAMUSCULAR | Status: DC | PRN
  Administered 2015-12-28: 1 mg via INTRAVENOUS

## 2015-12-28 MED ORDER — SODIUM CHLORIDE 0.9% FLUSH: 3.0000 mL | Freq: Two times a day (BID) | INTRAVENOUS | Status: DC

## 2015-12-28 SURGICAL SUPPLY — 9 items
CATH INFINITI 5FR ANG PIGTAIL (CATHETERS) ×2 IMPLANT
CATH INFINITI 5FR JL4 (CATHETERS) ×2 IMPLANT
CATH INFINITI JR4 5F (CATHETERS) ×2 IMPLANT
DEVICE CLOSURE MYNXGRIP 5F (Vascular Products) ×2 IMPLANT
KIT MANI 3VAL PERCEP (MISCELLANEOUS) ×2 IMPLANT
NEEDLE PERC 18GX7CM (NEEDLE) ×2 IMPLANT
PACK CARDIAC CATH (CUSTOM PROCEDURE TRAY) ×2 IMPLANT
SHEATH AVANTI 5FR X 11CM (SHEATH) ×4 IMPLANT
WIRE EMERALD 3MM-J .035X150CM (WIRE) ×2 IMPLANT

## 2015-12-28 NOTE — Progress Notes (Signed)
[place] at [facility]                                                                                                                                                                                            Patient Demographics   Andres Gutierrez, is a 82 y.o. male, DOB - [birth year], [id number]  Admit date - 12/26/2015   Admitting Physician Bettey Costa, MD  Outpatient Primary MD for the patient is Rusty Aus, MD   LOS - 2  Subjective: Patient denies any chest pain his waiting for cath    Review of Systems:   CONSTITUTIONAL: No documented fever. No fatigue, weakness. No weight gain, no weight loss.  EYES: No blurry or double vision.  ENT: No tinnitus. No postnasal drip. No redness of the oropharynx.  RESPIRATORY: No cough, no wheeze, no hemoptysis. No dyspnea.  CARDIOVASCULAR:Positive chest pain. No orthopnea. No palpitations. No syncope.  GASTROINTESTINAL: No nausea, no vomiting or diarrhea. No abdominal pain. No melena or hematochezia.  GENITOURINARY: No dysuria or hematuria.  ENDOCRINE: No polyuria or nocturia. No heat or cold intolerance.  HEMATOLOGY: No anemia. No bruising. No bleeding.  INTEGUMENTARY: No rashes. No lesions.  MUSCULOSKELETAL: No arthritis. No swelling. No gout.  NEUROLOGIC: No numbness, tingling, or ataxia. No seizure-type activity.  PSYCHIATRIC: No anxiety. No insomnia. No ADD.    Vitals:   Vitals:   12/28/15 0748 12/28/15 0749 12/28/15 1153 12/28/15 1442  BP: 115/66  104/66   Pulse: (!) 53 80 87 98  Resp:   20 20  Temp:   98 F (36.7 C) 98.1 F (36.7 C)  TempSrc:   Oral   SpO2:   96% 96%  Weight:      Height:        Wt Readings from Last 3 Encounters:  12/28/15 85.9 kg (189 lb 6.4 oz)  12/24/15 89.7 kg (197 lb 12.8 oz)  12/17/15 86.2 kg (190 lb)     Intake/Output Summary (Last 24 hours) at 12/28/15 1528 Last data filed at 12/28/15 1216  Gross per 24 hour  Intake           893.01 ml  Output               825 ml  Net            68.01 ml    Physical Exam:   GENERAL: Pleasant-appearing in no apparent distress.  HEAD, EYES, EARS, NOSE AND THROAT: Atraumatic, normocephalic. Extraocular muscles are intact. Pupils equal and reactive to light. Sclerae anicteric. No conjunctival injection. No oro-pharyngeal erythema.  NECK: Supple. There  is no jugular venous distention. No bruits, no lymphadenopathy, no thyromegaly.  HEART: Regular rate and rhythm,. No murmurs, no rubs, no clicks.  LUNGS: Clear to auscultation bilaterally. No rales or rhonchi. No wheezes.  ABDOMEN: Soft, flat, nontender, nondistended. Has good bowel sounds. No hepatosplenomegaly appreciated.  EXTREMITIES: No evidence of any cyanosis, clubbing, or peripheral edema.  +2 pedal and radial pulses bilaterally.  NEUROLOGIC: The patient is alert, awake, and oriented x3 with no focal motor or sensory deficits appreciated bilaterally.  SKIN: Moist and warm with no rashes appreciated.  Psych: Not anxious, depressed LN: No inguinal LN enlargement    Antibiotics   Anti-infectives    None      Medications   Scheduled Meds: . [MAR Hold] aspirin EC  81 mg Oral Daily  . [MAR Hold] atorvastatin  20 mg Oral QHS  . [MAR Hold] carvedilol  3.125 mg Oral BID WC  . [MAR Hold] digoxin  0.125 mg Oral Daily  . [MAR Hold] ferrous sulfate  325 mg Oral Daily  . [MAR Hold] furosemide  20 mg Oral Daily  . [MAR Hold] lisinopril  2.5 mg Oral Daily  . [MAR Hold] mupirocin ointment  1 application Topical TID  . [MAR Hold] pantoprazole  40 mg Oral BID AC  . [MAR Hold] potassium chloride  10 mEq Oral BID  . [MAR Hold] sodium chloride flush  3 mL Intravenous Q12H  . [MAR Hold] sodium chloride flush  3 mL Intravenous Q12H  . sodium chloride flush  3 mL Intravenous Q12H  . [MAR Hold] sucralfate  1 g Oral TID WC & HS  . [MAR Hold] tamsulosin  0.4 mg Oral QPC supper  . [MAR Hold] ascorbic acid  250 mg Oral Daily   Continuous Infusions: . sodium  chloride 1 mL/kg/hr (12/28/15 1448)  . heparin 1,100 Units/hr (12/27/15 1455)   PRN Meds:.[MAR Hold] sodium chloride, sodium chloride, [MAR Hold] acetaminophen **OR** [MAR Hold] acetaminophen, [MAR Hold] HYDROcodone-acetaminophen, [MAR Hold] nitroGLYCERIN, [MAR Hold] ondansetron **OR** [MAR Hold] ondansetron (ZOFRAN) IV, [MAR Hold] senna, [MAR Hold] senna-docusate, [MAR Hold] sodium chloride flush, sodium chloride flush   Data Review:   Micro Results No results found for this or any previous visit (from the past 240 hour(s)).  Radiology Reports Dg Chest 2 View  Result Date: 12/23/2015 CLINICAL DATA:  82 year old male with shortness of breath since this morning. Initial encounter. Former smoker. EXAM: CHEST  2 VIEW COMPARISON:  Chest CTA 10/18/2015 and earlier. FINDINGS: Sequelae of CABG. Stable mild cardiomegaly. Other mediastinal contours are within normal limits. Visualized tracheal air column is within normal limits. Right pleural effusion appears resolved since the June comparison. Mild eventration of the right hemidiaphragm. No pneumothorax, pulmonary edema, pleural effusion or confluent pulmonary opacity. No acute osseous abnormality identified. IMPRESSION: Right pleural effusion seen in June seems resolved. No acute cardiopulmonary abnormality. Electronically Signed   By: Genevie Ann M.D.   On: 12/23/2015 19:49   Dg Chest Port 1 View  Result Date: 12/26/2015 CLINICAL DATA:  Shortness of breath for several days with dizziness. History of atrial fibrillation, CAD and CABG. EXAM: PORTABLE CHEST 1 VIEW COMPARISON:  12/23/2015 radiographs.  CT 10/28/2015 FINDINGS: 0936 hours. Stable mild cardiac enlargement status post CABG. The pulmonary vascularity is normal. There is no edema, confluent airspace opacity, pleural effusion or pneumothorax. Old left-sided rib fracture noted. No acute osseous findings are seen. IMPRESSION: Stable postoperative chest.  No acute cardiopulmonary process. Electronically  Signed   By: Caryl Comes.D.  On: 12/26/2015 10:08   Dg Foot Complete Right  Result Date: 12/17/2015 CLINICAL DATA:  Swelling with no known cause. EXAM: RIGHT FOOT COMPLETE - 3+ VIEW COMPARISON:  None. FINDINGS: Healed distal second metatarsal fracture with an overlapping screw. Unusual configuration to the proximal second phalanx has a chronic appearance and is likely from previous trauma. Subluxation at the second MTP joint, likely from remote trauma. No other acute bony abnormalities are identified. Diffuse soft tissue swelling is seen with no underlying cause identified. No bony erosion. IMPRESSION: Soft tissue swelling. No acute abnormalities. Remote posttraumatic changes as detailed above. Electronically Signed   By: Dorise Bullion III M.D   On: 12/17/2015 10:46     CBC  Recent Labs Lab 12/23/15 1921 12/24/15 0419 12/26/15 0951 12/27/15 0341 12/28/15 0458  WBC 8.1 7.1 9.4 8.1 11.2*  HGB 12.3* 11.1* 12.1* 11.8* 13.3  HCT 35.3* 31.1* 34.4* 33.8* 39.2*  PLT 195 189 228 216 263  MCV 98.7 93.8 93.1 94.3 95.5  MCH 34.5* 33.4 32.8 32.8 32.3  MCHC 35.0 35.7 35.2 34.8 33.8  RDW 17.2* 16.7* 17.0* 17.1* 17.3*    Chemistries   Recent Labs Lab 12/24/15 0419 12/26/15 0951  NA 137 136  K 4.2 3.7  CL 102 105  CO2 27 24  GLUCOSE 109* 111*  BUN 25* 27*  CREATININE 1.18 1.18  CALCIUM 8.7* 9.1  AST  --  30  ALT  --  19  ALKPHOS  --  75  BILITOT  --  1.1   ------------------------------------------------------------------------------------------------------------------ estimated creatinine clearance is 58.6 mL/min (by C-G formula based on SCr of 1.18 mg/dL). ------------------------------------------------------------------------------------------------------------------  Recent Labs  12/26/15 1356  HGBA1C 5.7   ------------------------------------------------------------------------------------------------------------------  Recent Labs  12/26/15 1356  CHOL 117   HDL 21*  LDLCALC 80  TRIG 78  CHOLHDL 5.6   ------------------------------------------------------------------------------------------------------------------ No results for input(s): TSH, T4TOTAL, T3FREE, THYROIDAB in the last 72 hours.  Invalid input(s): FREET3 ------------------------------------------------------------------------------------------------------------------ No results for input(s): VITAMINB12, FOLATE, FERRITIN, TIBC, IRON, RETICCTPCT in the last 72 hours.  Coagulation profile  Recent Labs Lab 12/23/15 1921 12/26/15 1426 12/28/15 1001  INR 1.18 1.34 1.06    No results for input(s): DDIMER in the last 72 hours.  Cardiac Enzymes  Recent Labs Lab 12/26/15 1356 12/26/15 1902 12/27/15 0120  TROPONINI 0.05* 0.05* 0.05*   ------------------------------------------------------------------------------------------------------------------ Invalid input(s): POCBNP    Assessment & Plan   82 year old male with a history of chronic atrial fibrillation on anticoagulation, ASCVD and chronic systolic heart failure who presents with unstable angina.  1. Unstable angina Cardiac catheter later today further management per cardiology  2. Chronic atrial fibrillation: Continue Coreg, digoxin for heart rate control. Heparin for cath  3. Chronic systolic heart failure EF of 25-30%: Continue Lasix, lisinopril and Coreg.  4. Hyperlipidemia: Continue Lipitor.  5. ASCVD: Continue atorvastatin, aspirin, lisinopril and Coreg     Code Status Orders        Start     Ordered   12/26/15 1317  Full code  Continuous     12/26/15 1317    Code Status History    Date Active Date Inactive Code Status Order ID Comments User Context   12/23/2015 10:46 PM 12/24/2015  8:35 AM Full Code [id number]  Lance Coon, MD Inpatient   11/03/2015  5:58 PM 11/05/2015  7:05 PM Full Code [id number]  Lytle Butte, MD ED   10/29/2015  1:08 AM 11/01/2015  4:05 PM Full Code [id number]  Alesia Richards, MD ED  12/22/2014  2:23 AM 12/24/2014  2:13 PM Full Code [id number]  Juluis Mire, MD Inpatient           Consults  72min   DVT Prophylaxis  heparin  Lab Results  Component Value Date   PLT 263 12/28/2015     Time Spent in minutes  38min  Greater than 50% of time spent in care coordination and counseling patient regarding the condition and plan of care.   Dustin Flock M.D on 12/28/2015 at 3:28 PM  Between 7am to 6pm - Pager - [phone]  After 6pm go to [email or web address] - password [email or web address] [place] [facility]   Office  [phone]

## 2015-12-28 NOTE — Progress Notes (Signed)
Patient status post heart cath. VSS. No signs of bleeding or hematoma noted. Will continue to monitor.

## 2015-12-29 ENCOUNTER — Inpatient Hospital Stay: Payer: PPO

## 2015-12-29 ENCOUNTER — Encounter: Payer: Self-pay | Admitting: Internal Medicine

## 2015-12-29 DIAGNOSIS — I4891 Unspecified atrial fibrillation: Secondary | ICD-10-CM | POA: Diagnosis not present

## 2015-12-29 DIAGNOSIS — I5022 Chronic systolic (congestive) heart failure: Secondary | ICD-10-CM | POA: Diagnosis not present

## 2015-12-29 DIAGNOSIS — I200 Unstable angina: Secondary | ICD-10-CM | POA: Diagnosis not present

## 2015-12-29 DIAGNOSIS — E785 Hyperlipidemia, unspecified: Secondary | ICD-10-CM | POA: Diagnosis not present

## 2015-12-29 LAB — NM MYOCAR MULTI W/SPECT W/WALL MOTION / EF
CHL CUP MPHR: 138 {beats}/min
CHL CUP NUCLEAR SSS: 5
CSEPEDS: 0 s
CSEPPHR: 110 {beats}/min
Estimated workload: 1 METS
Exercise duration (min): 1 min
LV dias vol: 189 mL (ref 62–150)
LVSYSVOL: 133 mL
NUC STRESS TID: 1.05
Percent HR: 79 %
Rest HR: 80 {beats}/min
SDS: 2
SRS: 4

## 2015-12-29 LAB — CBC
HEMATOCRIT: 34.5 % — AB (ref 40.0–52.0)
Hemoglobin: 12.4 g/dL — ABNORMAL LOW (ref 13.0–18.0)
MCH: 35.3 pg — ABNORMAL HIGH (ref 26.0–34.0)
MCHC: 36 g/dL (ref 32.0–36.0)
MCV: 98.1 fL (ref 80.0–100.0)
PLATELETS: 231 10*3/uL (ref 150–440)
RBC: 3.51 MIL/uL — ABNORMAL LOW (ref 4.40–5.90)
RDW: 17.2 % — AB (ref 11.5–14.5)
WBC: 6.4 10*3/uL (ref 3.8–10.6)

## 2015-12-29 LAB — HEPARIN LEVEL (UNFRACTIONATED): Heparin Unfractionated: 0.52 IU/mL (ref 0.30–0.70)

## 2015-12-29 LAB — APTT: APTT: 31 s (ref 24–36)

## 2015-12-29 MED ORDER — APIXABAN 5 MG PO TABS
5.0000 mg | ORAL_TABLET | Freq: Two times a day (BID) | ORAL | Status: DC
  Administered 2015-12-29 – 2016-01-02 (×8): 5 mg via ORAL
  Filled 2015-12-29 (×8): qty 1

## 2015-12-29 MED ORDER — TECHNETIUM TC 99M TETROFOSMIN IV KIT
30.1800 | PACK | Freq: Once | INTRAVENOUS | Status: AC | PRN
  Administered 2015-12-29: 30.18 via INTRAVENOUS

## 2015-12-29 MED ORDER — REGADENOSON 0.4 MG/5ML IV SOLN
0.4000 mg | Freq: Once | INTRAVENOUS | Status: DC
  Filled 2015-12-29: qty 5

## 2015-12-29 MED ORDER — TECHNETIUM TC 99M TETROFOSMIN IV KIT
13.9600 | PACK | Freq: Once | INTRAVENOUS | Status: AC | PRN
  Administered 2015-12-29: 13.96 via INTRAVENOUS

## 2015-12-29 MED ORDER — ATORVASTATIN CALCIUM 20 MG PO TABS
40.0000 mg | ORAL_TABLET | Freq: Every day | ORAL | Status: DC
  Administered 2015-12-29 – 2016-01-01 (×4): 40 mg via ORAL
  Filled 2015-12-29 (×4): qty 2

## 2015-12-29 MED ORDER — REGADENOSON 0.4 MG/5ML IV SOLN
0.4000 mg | Freq: Once | INTRAVENOUS | Status: AC
  Administered 2015-12-29: 0.4 mg via INTRAVENOUS
  Filled 2015-12-29: qty 5

## 2015-12-29 NOTE — Consult Note (Signed)
Reason for Consult: Shortness of breath dyspnea chest pain Referring Physician: Dr. Benjie Karvonen hospitalist, primary physician Emily Filbert MD  Andres Gutierrez is an 82 y.o. male.  HPI: Patient's 82 year old male known coronary disease history of cardiomyopathy systolic dysfunction congestive heart failure EF of 25-30% Disease coronary bypass surgery recently wasn't in the hospital but recently discharged. Patient had persistent dyspnea with minimal exertion C came back to the hospital complaining of diaphoresis and vertigo he's also had left-sided accelerated chest pain troponins were slightly elevated as a cardiology consultation was then recommended. Patient has known coronary disease with coronary bypass surgery in the past PCI and stent in the past. Patient has paroxysmal atrial fibrillation anticoagulated with Eliquis rate control with digoxin and Coreg. Patient's had recurrent persistent symptoms has not been able to manage on his own because of dyspnea and shortness of breath and presented to emergency room with elevated troponins.  Past Medical History:  Diagnosis Date  . Anemia   . Anxiety   . Atrial fibrillation ([place])   . Cardiomyopathy ([place])   . Chronic kidney disease    kidney stones  . Coronary artery disease   . Cough   . Dysrhythmia   . Hypertension   . Lymphadenopathy, hilar   . MI (myocardial infarction) ([place])    x 2  . Wheezing     Past Surgical History:  Procedure Laterality Date  . BRONCHIAL NEEDLE ASPIRATION BIOPSY N/A 12/31/2014   Procedure: BRONCHIAL NEEDLE ASPIRATION BIOPSIES from carina;  Surgeon: Flora Lipps, MD;  Location: [facility] ORS;  Service: Cardiopulmonary;  Laterality: N/A;  . CARDIAC CATHETERIZATION N/A 12/28/2015   Procedure: Left Heart Cath and Cors/Grafts Angiography;  Surgeon: Yolonda Kida, MD;  Location: [facility];  Service: Cardiovascular;  Laterality: N/A;  . CORONARY ANGIOPLASTY WITH STENT PLACEMENT    . CORONARY ARTERY BYPASS GRAFT    .  ENDOBRONCHIAL ULTRASOUND N/A 12/31/2014   Procedure: ENDOBRONCHIAL ULTRASOUND;  Surgeon: Flora Lipps, MD;  Location: [facility] ORS;  Service: Cardiopulmonary;  Laterality: N/A;  . ESOPHAGOGASTRODUODENOSCOPY (EGD) WITH PROPOFOL N/A 06/20/2015   Procedure: ESOPHAGOGASTRODUODENOSCOPY (EGD) WITH PROPOFOL;  Surgeon: Lollie Sails, MD;  Location: [facility] ENDOSCOPY;  Service: Endoscopy;  Laterality: N/A;    Family History  Problem Relation Age of Onset  . CAD Mother   . Colon cancer Father     Social History:  reports that he quit smoking about 38 years ago. His smokeless tobacco use includes Chew. He reports that he drinks alcohol. He reports that he does not use drugs.  Allergies:  Allergies  Allergen Reactions  . Diphenhydramine Hcl Other (See Comments)    Reaction:  Rash and fever a long time ago, but has had it since with no problem.  . Escitalopram Other (See Comments)    Reaction:  Makes him feel faint, like he was going to have a heart attack.  . Maxidex [Dexamethasone] Other (See Comments)    Reaction:  Unknown   . Prednisone Other (See Comments)    Pt states that this medication made him feel crazy.    . Serotonin Other (See Comments)    Tried 2 different types, lexapro and another one.  Reaction:  Made him feel like he was having a heart attack.   . Vytorin [Ezetimibe-Simvastatin] Other (See Comments)    Reaction:  Unknown   . Zocor [Simvastatin] Other (See Comments)    Reaction:  Unknown     Medications: I have reviewed the patient's current medications.  Results for orders placed or performed during the hospital encounter of 12/26/15 (from the past 48 hour(s))  CBC     Status: Abnormal   Collection Time: 12/28/15  4:58 AM  Result Value Ref Range   WBC 11.2 (H) 3.8 - 10.6 K/uL   RBC 4.10 (L) 4.40 - 5.90 MIL/uL   Hemoglobin 13.3 13.0 - 18.0 g/dL   HCT 39.2 (L) 40.0 - 52.0 %   MCV 95.5 80.0 - 100.0 fL   MCH 32.3 26.0 - 34.0 pg   MCHC 33.8 32.0 - 36.0 g/dL   RDW 17.3 (H)  11.5 - 14.5 %   Platelets 263 150 - 440 K/uL  APTT     Status: Abnormal   Collection Time: 12/28/15  4:58 AM  Result Value Ref Range   aPTT 86 (H) 24 - 36 seconds    Comment:        IF BASELINE aPTT IS ELEVATED, SUGGEST PATIENT RISK ASSESSMENT BE USED TO DETERMINE APPROPRIATE ANTICOAGULANT THERAPY.   Heparin level (unfractionated)     Status: Abnormal   Collection Time: 12/28/15  4:58 AM  Result Value Ref Range   Heparin Unfractionated 1.31 (H) 0.30 - 0.70 IU/mL    Comment:        IF HEPARIN RESULTS ARE BELOW EXPECTED VALUES, AND PATIENT DOSAGE HAS BEEN CONFIRMED, SUGGEST FOLLOW UP TESTING OF ANTITHROMBIN III LEVELS.   Protime-INR     Status: None   Collection Time: 12/28/15 10:01 AM  Result Value Ref Range   Prothrombin Time 13.8 11.4 - 15.2 seconds   INR 1.06   Heparin level (unfractionated)     Status: None   Collection Time: 12/29/15  5:50 AM  Result Value Ref Range   Heparin Unfractionated 0.52 0.30 - 0.70 IU/mL    Comment:        IF HEPARIN RESULTS ARE BELOW EXPECTED VALUES, AND PATIENT DOSAGE HAS BEEN CONFIRMED, SUGGEST FOLLOW UP TESTING OF ANTITHROMBIN III LEVELS.   APTT     Status: None   Collection Time: 12/29/15  5:50 AM  Result Value Ref Range   aPTT 31 24 - 36 seconds  CBC     Status: Abnormal   Collection Time: 12/29/15  5:50 AM  Result Value Ref Range   WBC 6.4 3.8 - 10.6 K/uL   RBC 3.51 (L) 4.40 - 5.90 MIL/uL   Hemoglobin 12.4 (L) 13.0 - 18.0 g/dL   HCT 34.5 (L) 40.0 - 52.0 %   MCV 98.1 80.0 - 100.0 fL   MCH 35.3 (H) 26.0 - 34.0 pg   MCHC 36.0 32.0 - 36.0 g/dL   RDW 17.2 (H) 11.5 - 14.5 %   Platelets 231 150 - 440 K/uL    No results found.  Review of Systems  Constitutional: Positive for diaphoresis, malaise/fatigue and weight loss.  HENT: Positive for congestion.   Eyes: Negative.   Respiratory: Positive for shortness of breath.   Cardiovascular: Positive for chest pain, palpitations, orthopnea and leg swelling.  Gastrointestinal:  Negative.   Genitourinary: Negative.   Musculoskeletal: Positive for myalgias.  Skin: Negative.   Neurological: Positive for weakness and headaches.  Endo/Heme/Allergies: Negative.   Psychiatric/Behavioral: Negative.    Blood pressure 121/61, pulse 82, temperature 97.5 F (36.4 C), temperature source Oral, resp. rate 19, height 6\' 4"  (1.93 m), weight 84.1 kg (185 lb 6.5 oz), SpO2 96 %. Physical Exam  Nursing note and vitals reviewed. Constitutional: He is oriented to person, place, and time. He appears well-developed and  well-nourished.  HENT:  Head: Normocephalic.  Eyes: Conjunctivae and EOM are normal. Pupils are equal, round, and reactive to light.  Neck: Normal range of motion. Neck supple.  Cardiovascular: Normal rate and regular rhythm.  Exam reveals gallop.   Murmur heard. Respiratory: Effort normal and breath sounds normal.  GI: Soft. Bowel sounds are normal.  Musculoskeletal: Normal range of motion.  Neurological: He is alert and oriented to person, place, and time. He has normal reflexes.  Skin: Skin is warm and dry.    Assessment/Plan: Dyspnea on exertion Generalized weakness Congestive heart failure Cardiomyopathy systolic dysfunction Chest pain atypical Atrial fibrillation paroxysmal Chronic renal insufficiency Anemia Hypertension Myocardial infarction 2 Coronary bypass surgery GERD . PLAN Agree with admission to telemetry rule out unremarkable infarctions Follow-up cardiac enzymes Hold Eliquis and use short-term anticoagulation Echocardiogram for shortness of breath heart failure Consider cardiac catheter for chest pain symptoms Agree with DVT prophylaxis Physical therapy and consider rehabilitation Hyperlipidemia and consider statin therapy Heart failure therapy with Coreg digoxin lisinopril Continue Protonix Carafate for GERD symptoms Consider pulmonary input for shortness of breath and dyspnea  Andres Gutierrez. 12/29/2015, 1:50 PM

## 2015-12-29 NOTE — Progress Notes (Signed)
[facility] at [facility]                                                                                                                                                                                            Patient Demographics   Andres Gutierrez, is a 82 y.o. male, DOB - October 22, 1930, [id number]  Admit date - 12/26/2015   Admitting Physician Bettey Costa, MD  Outpatient Primary MD for the patient is Rusty Aus, MD   LOS - 3  Subjective: Patient had cardiac catheterization with  obstruction noted on the bypass graft    Review of Systems:   CONSTITUTIONAL: No documented fever. No fatigue, weakness. No weight gain, no weight loss.  EYES: No blurry or double vision.  ENT: No tinnitus. No postnasal drip. No redness of the oropharynx.  RESPIRATORY: No cough, no wheeze, no hemoptysis. No dyspnea.  CARDIOVASCULAR:no chest pain. No orthopnea. No palpitations. No syncope.  GASTROINTESTINAL: No nausea, no vomiting or diarrhea. No abdominal pain. No melena or hematochezia.  GENITOURINARY: No dysuria or hematuria.  ENDOCRINE: No polyuria or nocturia. No heat or cold intolerance.  HEMATOLOGY: No anemia. No bruising. No bleeding.  INTEGUMENTARY: No rashes. No lesions.  MUSCULOSKELETAL: No arthritis. No swelling. No gout.  NEUROLOGIC: No numbness, tingling, or ataxia. No seizure-type activity.  PSYCHIATRIC: No anxiety. No insomnia. No ADD.    Vitals:   Vitals:   12/29/15 0525 12/29/15 0529 12/29/15 0800 12/29/15 1121  BP: (!) 97/56  114/60 120/72  Pulse: 100  84 85  Resp:    17  Temp: 97.4 F (36.3 C)  97.9 F (36.6 C) 98.2 F (36.8 C)  TempSrc: Oral  Oral   SpO2: 96%  94% 94%  Weight:  84.1 kg (185 lb 6.5 oz)    Height:        Wt Readings from Last 3 Encounters:  12/29/15 84.1 kg (185 lb 6.5 oz)  12/24/15 89.7 kg (197 lb 12.8 oz)  12/17/15 86.2 kg (190 lb)     Intake/Output Summary (Last 24 hours) at 12/29/15 1234 Last data filed at  12/29/15 1100  Gross per 24 hour  Intake                0 ml  Output             1305 ml  Net            -1305 ml    Physical Exam:   GENERAL: Pleasant-appearing in no apparent distress.  HEAD, EYES, EARS, NOSE AND THROAT: Atraumatic, normocephalic. Extraocular muscles are intact. Pupils equal and  reactive to light. Sclerae anicteric. No conjunctival injection. No oro-pharyngeal erythema.  NECK: Supple. There is no jugular venous distention. No bruits, no lymphadenopathy, no thyromegaly.  HEART: Regular rate and rhythm,. No murmurs, no rubs, no clicks.  LUNGS: Clear to auscultation bilaterally. No rales or rhonchi. No wheezes.  ABDOMEN: Soft, flat, nontender, nondistended. Has good bowel sounds. No hepatosplenomegaly appreciated.  EXTREMITIES: No evidence of any cyanosis, clubbing, or peripheral edema.  +2 pedal and radial pulses bilaterally.  NEUROLOGIC: The patient is alert, awake, and oriented x3 with no focal motor or sensory deficits appreciated bilaterally.  SKIN: Moist and warm with no rashes appreciated.  Psych: Not anxious, depressed LN: No inguinal LN enlargement    Antibiotics   Anti-infectives    None      Medications   Scheduled Meds: . aspirin EC  81 mg Oral Daily  . atorvastatin  20 mg Oral QHS  . carvedilol  3.125 mg Oral BID WC  . digoxin  0.125 mg Oral Daily  . ferrous sulfate  325 mg Oral Daily  . furosemide  20 mg Oral Daily  . lisinopril  2.5 mg Oral Daily  . mupirocin ointment  1 application Topical TID  . pantoprazole  40 mg Oral BID AC  . potassium chloride  10 mEq Oral BID  . regadenoson  0.4 mg Intravenous Once  . regadenoson  0.4 mg Intravenous Once  . sodium chloride flush  3 mL Intravenous Q12H  . sucralfate  1 g Oral TID WC & HS  . tamsulosin  0.4 mg Oral QPC supper  . ascorbic acid  250 mg Oral Daily   Continuous Infusions:   PRN Meds:.sodium chloride, acetaminophen **OR** acetaminophen, acetaminophen, HYDROcodone-acetaminophen,  nitroGLYCERIN, ondansetron **OR** ondansetron (ZOFRAN) IV, senna, senna-docusate, sodium chloride flush   Data Review:   Micro Results No results found for this or any previous visit (from the past 240 hour(s)).  Radiology Reports Dg Chest 2 View  Result Date: 12/23/2015 CLINICAL DATA:  82 year old male with shortness of breath since this morning. Initial encounter. Former smoker. EXAM: CHEST  2 VIEW COMPARISON:  Chest CTA 10/18/2015 and earlier. FINDINGS: Sequelae of CABG. Stable mild cardiomegaly. Other mediastinal contours are within normal limits. Visualized tracheal air column is within normal limits. Right pleural effusion appears resolved since the June comparison. Mild eventration of the right hemidiaphragm. No pneumothorax, pulmonary edema, pleural effusion or confluent pulmonary opacity. No acute osseous abnormality identified. IMPRESSION: Right pleural effusion seen in June seems resolved. No acute cardiopulmonary abnormality. Electronically Signed   By: Genevie Ann M.D.   On: 12/23/2015 19:49   Dg Chest Port 1 View  Result Date: 12/26/2015 CLINICAL DATA:  Shortness of breath for several days with dizziness. History of atrial fibrillation, CAD and CABG. EXAM: PORTABLE CHEST 1 VIEW COMPARISON:  12/23/2015 radiographs.  CT 10/28/2015 FINDINGS: 0936 hours. Stable mild cardiac enlargement status post CABG. The pulmonary vascularity is normal. There is no edema, confluent airspace opacity, pleural effusion or pneumothorax. Old left-sided rib fracture noted. No acute osseous findings are seen. IMPRESSION: Stable postoperative chest.  No acute cardiopulmonary process. Electronically Signed   By: Richardean Sale M.D.   On: 12/26/2015 10:08   Dg Foot Complete Right  Result Date: 12/17/2015 CLINICAL DATA:  Swelling with no known cause. EXAM: RIGHT FOOT COMPLETE - 3+ VIEW COMPARISON:  None. FINDINGS: Healed distal second metatarsal fracture with an overlapping screw. Unusual configuration to the  proximal second phalanx has a chronic appearance and is likely  from previous trauma. Subluxation at the second MTP joint, likely from remote trauma. No other acute bony abnormalities are identified. Diffuse soft tissue swelling is seen with no underlying cause identified. No bony erosion. IMPRESSION: Soft tissue swelling. No acute abnormalities. Remote posttraumatic changes as detailed above. Electronically Signed   By: Dorise Bullion III M.D   On: 12/17/2015 10:46     CBC  Recent Labs Lab 12/24/15 0419 12/26/15 0951 12/27/15 0341 12/28/15 0458 12/29/15 0550  WBC 7.1 9.4 8.1 11.2* 6.4  HGB 11.1* 12.1* 11.8* 13.3 12.4*  HCT 31.1* 34.4* 33.8* 39.2* 34.5*  PLT 189 228 216 263 231  MCV 93.8 93.1 94.3 95.5 98.1  MCH 33.4 32.8 32.8 32.3 35.3*  MCHC 35.7 35.2 34.8 33.8 36.0  RDW 16.7* 17.0* 17.1* 17.3* 17.2*    Chemistries   Recent Labs Lab 12/24/15 0419 12/26/15 0951  NA 137 136  K 4.2 3.7  CL 102 105  CO2 27 24  GLUCOSE 109* 111*  BUN 25* 27*  CREATININE 1.18 1.18  CALCIUM 8.7* 9.1  AST  --  30  ALT  --  19  ALKPHOS  --  75  BILITOT  --  1.1   ------------------------------------------------------------------------------------------------------------------ estimated creatinine clearance is 57.4 mL/min (by C-G formula based on SCr of 1.18 mg/dL). ------------------------------------------------------------------------------------------------------------------  Recent Labs  12/26/15 1356  HGBA1C 5.7   ------------------------------------------------------------------------------------------------------------------  Recent Labs  12/26/15 1356  CHOL 117  HDL 21*  LDLCALC 80  TRIG 78  CHOLHDL 5.6   ------------------------------------------------------------------------------------------------------------------ No results for input(s): TSH, T4TOTAL, T3FREE, THYROIDAB in the last 72 hours.  Invalid input(s):  FREET3 ------------------------------------------------------------------------------------------------------------------ No results for input(s): VITAMINB12, FOLATE, FERRITIN, TIBC, IRON, RETICCTPCT in the last 72 hours.  Coagulation profile  Recent Labs Lab 12/23/15 1921 12/26/15 1426 12/28/15 1001  INR 1.18 1.34 1.06    No results for input(s): DDIMER in the last 72 hours.  Cardiac Enzymes  Recent Labs Lab 12/26/15 1356 12/26/15 1902 12/27/15 0120  TROPONINI 0.05* 0.05* 0.05*   ------------------------------------------------------------------------------------------------------------------ Invalid input(s): POCBNP    Assessment & Plan   82 year old male with a history of chronic atrial fibrillation on anticoagulation, ASCVD and chronic systolic heart failure who presents with unstable angina.  1. Obstructed graft plan for functional study today further therapy per cardiology  2. Chronic atrial fibrillation: Continue Coreg, digoxin for heart rate control.  3. Chronic systolic heart failure EF of 25-30%: Continue Lasix, lisinopril and Coreg.  4. Hyperlipidemia: Continue Lipitor.  5. ASCVD: Continue atorvastatin, aspirin, lisinopril and Coreg     Code Status Orders        Start     Ordered   12/26/15 1317  Full code  Continuous     12/26/15 1317    Code Status History    Date Active Date Inactive Code Status Order ID Comments User Context   12/23/2015 10:46 PM 12/24/2015  8:35 AM Full Code [id number]  Lance Coon, MD Inpatient   11/03/2015  5:58 PM 11/05/2015  7:05 PM Full Code [id number]  Lytle Butte, MD ED   10/29/2015  1:08 AM 11/01/2015  4:05 PM Full Code [id number]  Alesia Richards, MD ED   12/22/2014  2:23 AM 12/24/2014  2:13 PM Full Code [id number]  Juluis Mire, MD Inpatient           Consults  29min   DVT Prophylaxis  heparin  Lab Results  Component Value Date   PLT 231 12/29/2015     Time Spent in minutes  12min  Greater than  50% of time spent in care coordination and counseling patient regarding the condition and plan of care.   Dustin Flock M.D on 12/29/2015 at 12:34 PM  Between 7am to 6pm - Pager - [phone]  After 6pm go to [email or web address] - password [email or web address] [place] [facility]   Office  [phone]

## 2015-12-30 DIAGNOSIS — I5022 Chronic systolic (congestive) heart failure: Secondary | ICD-10-CM | POA: Diagnosis not present

## 2015-12-30 DIAGNOSIS — I4891 Unspecified atrial fibrillation: Secondary | ICD-10-CM | POA: Diagnosis not present

## 2015-12-30 DIAGNOSIS — I200 Unstable angina: Secondary | ICD-10-CM | POA: Diagnosis not present

## 2015-12-30 DIAGNOSIS — E785 Hyperlipidemia, unspecified: Secondary | ICD-10-CM | POA: Diagnosis not present

## 2015-12-30 LAB — BASIC METABOLIC PANEL
Anion gap: 10 (ref 5–15)
BUN: 26 mg/dL — AB (ref 6–20)
CO2: 22 mmol/L (ref 22–32)
CREATININE: 1.09 mg/dL (ref 0.61–1.24)
Calcium: 8.6 mg/dL — ABNORMAL LOW (ref 8.9–10.3)
Chloride: 102 mmol/L (ref 101–111)
Glucose, Bld: 104 mg/dL — ABNORMAL HIGH (ref 65–99)
POTASSIUM: 3.8 mmol/L (ref 3.5–5.1)
SODIUM: 134 mmol/L — AB (ref 135–145)

## 2015-12-30 MED ORDER — ATORVASTATIN CALCIUM 40 MG PO TABS: 40.0000 mg | ORAL_TABLET | Freq: Every day | ORAL | 0 refills | Status: DC

## 2015-12-30 MED ORDER — RANOLAZINE ER 500 MG PO TB12: 500.0000 mg | ORAL_TABLET | Freq: Two times a day (BID) | ORAL | 0 refills | Status: DC

## 2015-12-30 MED ORDER — METOPROLOL TARTRATE 25 MG PO TABS
25.0000 mg | ORAL_TABLET | Freq: Two times a day (BID) | ORAL | Status: DC
  Administered 2015-12-30 – 2016-01-02 (×6): 25 mg via ORAL
  Filled 2015-12-30 (×6): qty 1

## 2015-12-30 MED ORDER — HYDROCODONE-ACETAMINOPHEN 5-325 MG PO TABS: 1.0000 | ORAL_TABLET | ORAL | 0 refills | Status: DC | PRN

## 2015-12-30 NOTE — NC FL2 (Signed)
[place] LEVEL OF CARE SCREENING TOOL     IDENTIFICATION  Patient Name: Andres Gutierrez Birthdate: 1930-11-28 Sex: male Admission Date (Current Location): 12/26/2015  [place] and [place] Number:  Engineering geologist and Address:  [facility], [address] [place], [place] [address]      Provider Number: [id number]  Attending Physician Name and Address:  Dustin Flock, MD  Relative Name and Phone Number:  Tiana Loft Daughter [phone]    Current Level of Care: Hospital Recommended Level of Care: [place] Prior Approval Number:    Date Approved/Denied:   PASRR Number: [id number]  Discharge Plan: SNF    Current Diagnoses: Patient Active Problem List   Diagnosis Date Noted  . Atrial fibrillation with RVR ([place]) 12/23/2015  . Dyspnea on exertion 12/23/2015  . Elevated troponin 12/23/2015  . GERD (gastroesophageal reflux disease) 12/23/2015  . Other iron deficiency anemias 12/20/2015  . Femoral neck fracture, left, closed, initial encounter 11/03/2015  . Chronic atrial fibrillation ([place])   . Coronary artery disease involving native coronary artery of native heart without angina pectoris   . Acalculous cholecystitis 10/29/2015  . Adenopathy   . CAD (coronary artery disease) 12/22/2014  . HTN (hypertension) 12/22/2014    Orientation RESPIRATION BLADDER Height & Weight     Self, Time, Situation, Place  Normal Continent Weight: 186 lb 9.6 oz (84.6 kg) Height:  6\' 4"  (193 cm)  BEHAVIORAL SYMPTOMS/MOOD NEUROLOGICAL BOWEL NUTRITION STATUS      Continent Diet (Cardiac diet)  AMBULATORY STATUS COMMUNICATION OF NEEDS Skin   Limited Assist Verbally Surgical wounds                       Personal Care Assistance Level of Assistance  Bathing, Feeding, Dressing Bathing Assistance: Limited assistance Feeding assistance: Limited assistance Dressing Assistance: Limited assistance     Functional Limitations  Info  Hearing, Sight, Speech Sight Info: Adequate Hearing Info: Adequate Speech Info: Adequate    SPECIAL CARE FACTORS FREQUENCY  PT (By licensed PT)     PT Frequency: 5x a week              Contractures Contractures Info: Not present    Additional Factors Info  Code Status, Allergies Code Status Info: Full Code Allergies Info: DIPHENHYDRAMINE HCL, ESCITALOPRAM, MAXIDEX DEXAMETHASONE, PREDNISONE, SEROTONIN, VYTORIN EZETIMIBE-SIMVASTATIN, ZOCOR SIMVASTATIN            Current Medications (12/30/2015):  This is the current hospital active medication list Current Facility-Administered Medications  Medication Dose Route Frequency Provider Last Rate Last Dose  . 0.9 %  sodium chloride infusion  250 mL Intravenous PRN Dwayne D Callwood, MD      . acetaminophen (TYLENOL) tablet 650 mg  650 mg Oral Q6H PRN Bettey Costa, MD       Or  . acetaminophen (TYLENOL) suppository 650 mg  650 mg Rectal Q6H PRN Bettey Costa, MD      . acetaminophen (TYLENOL) tablet 650 mg  650 mg Oral Q4H PRN Dwayne D Callwood, MD      . apixaban (ELIQUIS) tablet 5 mg  5 mg Oral BID Yolonda Kida, MD   5 mg at 12/30/15 0914  . aspirin EC tablet 81 mg  81 mg Oral Daily Bettey Costa, MD   81 mg at 12/30/15 0914  . atorvastatin (LIPITOR) tablet 40 mg  40 mg Oral QHS Dustin Flock, MD   40 mg at 12/29/15 2113  . carvedilol (  COREG) tablet 3.125 mg  3.125 mg Oral BID WC Bettey Costa, MD   3.125 mg at 12/30/15 0913  . digoxin (LANOXIN) tablet 0.125 mg  0.125 mg Oral Daily Bettey Costa, MD   0.125 mg at 12/30/15 0914  . ferrous sulfate tablet 325 mg  325 mg Oral Daily Bettey Costa, MD   325 mg at 12/30/15 0914  . furosemide (LASIX) tablet 20 mg  20 mg Oral Daily Bettey Costa, MD   20 mg at 12/30/15 0914  . HYDROcodone-acetaminophen (NORCO/VICODIN) 5-325 MG per tablet 1-2 tablet  1-2 tablet Oral Q4H PRN Bettey Costa, MD      . lisinopril (PRINIVIL,ZESTRIL) tablet 2.5 mg  2.5 mg Oral Daily Bettey Costa, MD   2.5 mg at 12/30/15 0914   . mupirocin ointment (BACTROBAN) 2 % 1 application  1 application Topical TID Bettey Costa, MD   1 application at 0000000 0916  . nitroGLYCERIN (NITROSTAT) SL tablet 0.4 mg  0.4 mg Sublingual Q5 min PRN Bettey Costa, MD      . ondansetron (ZOFRAN) tablet 4 mg  4 mg Oral Q6H PRN Bettey Costa, MD       Or  . ondansetron (ZOFRAN) injection 4 mg  4 mg Intravenous Q6H PRN Sital Mody, MD      . pantoprazole (PROTONIX) EC tablet 40 mg  40 mg Oral BID AC Bettey Costa, MD   40 mg at 12/30/15 0914  . potassium chloride (K-DUR) CR tablet 10 mEq  10 mEq Oral BID Bettey Costa, MD   10 mEq at 12/30/15 0913  . regadenoson (LEXISCAN) injection SOLN 0.4 mg  0.4 mg Intravenous Once Dwayne D Callwood, MD      . senna (SENOKOT) tablet 8.6 mg  1 tablet Oral QHS PRN Bettey Costa, MD      . senna-docusate (Senokot-S) tablet 1 tablet  1 tablet Oral QHS PRN Bettey Costa, MD      . sodium chloride flush (NS) 0.9 % injection 3 mL  3 mL Intravenous Q12H Dwayne D Callwood, MD   3 mL at 12/30/15 1000  . sodium chloride flush (NS) 0.9 % injection 3 mL  3 mL Intravenous PRN Dwayne D Callwood, MD      . sucralfate (CARAFATE) tablet 1 g  1 g Oral TID WC & HS Bettey Costa, MD   1 g at 12/30/15 0914  . tamsulosin (FLOMAX) capsule 0.4 mg  0.4 mg Oral QPC supper Bettey Costa, MD   0.4 mg at 12/29/15 1730  . vitamin C (ASCORBIC ACID) tablet 250 mg  250 mg Oral Daily Bettey Costa, MD   250 mg at 12/30/15 [id number]     Discharge Medications: Please see discharge summary for a list of discharge medications.  Relevant Imaging Results:  Relevant Lab Results:   Additional Information [id number]  Ross Ludwig

## 2015-12-30 NOTE — Clinical Social Work Note (Signed)
Clinical Social Work Assessment  Patient Details  Name: Andres Gutierrez MRN: [id number] Date of Birth: 05-Jun-1930  Date of referral:  12/30/15               Reason for consult:  Facility Placement                Permission sought to share information with:  Family Supports, Customer service manager Permission granted to share information::  Yes, Verbal Permission Granted  Name::     Tiana Loft Daughter [phone]   Agency::  SNF admissions  Relationship::     Contact Information:     Housing/Transportation Living arrangements for the past 2 months:  Single Family Home Source of Information:  Patient Patient Interpreter Needed:  None Criminal Activity/Legal Involvement Pertinent to Current Situation/Hospitalization:  No - Comment as needed Significant Relationships:  Adult Children Lives with:  Self Do you feel safe going back to the place where you live?  Yes (Patient feels he can return back home, but is open to going to SNF if he has to.) Need for family participation in patient care:  No (Coment)  Care giving concerns:  Patient feels like he is okay to return back home, but is open to going to SNF if he needs to.   Social Worker assessment / plan:  Patient is an 82 year old male who is alert and oriented x4 and able to express his needs.  Patient is from home alone, but his daughter is two blocks away and visits him on daily basis.  Patient also expressed that his son is 6 miles away and checks in on him daily.  Patient states he has not been to SNF for short term rehab, MSW explained to patient what to expect and how his insurance will pay for his stay.  Patient states that he has been receiving home health PT and an aide, and would prefer to return back home, however he is open to going to SNF if he has to.  Patient expressed that he feels like he has good support around him.  Patient expressed that he did not have any other questions or concerns.  Patient gave MSW  permission to fax out to [facility] SNFs.  Employment status:  Retired Nurse, adult PT Recommendations:  [place] / Referral to community resources:     Patient/Family's Response to care: Patient in agreement to going to SNF, but would prefer to go home with home health.  Patient/Family's Understanding of and Emotional Response to Diagnosis, Current Treatment, and Prognosis:  Patient expressed that he feels good about returning back home.  Patient also expressed that he is feeling much better.  Emotional Assessment Appearance:    Attitude/Demeanor/Rapport:    Affect (typically observed):  Appropriate, Calm Orientation:  Oriented to Self, Oriented to Place, Oriented to  Time, Oriented to Situation Alcohol / Substance use:  Not Applicable Psych involvement (Current and /or in the community):  No (Comment)  Discharge Needs  Concerns to be addressed:  Lack of Support Readmission within the last 30 days:  Yes (December 24, 2015 to home) Current discharge risk:  Lack of support system Barriers to Discharge:  Insurance Authorization   Ross Ludwig 12/30/2015, 12:33 PM

## 2015-12-30 NOTE — Evaluation (Signed)
Physical Therapy Evaluation Patient Details Name: Andres Gutierrez MRN: [id number] DOB: [birth year] Today's Date: 12/30/2015   History of Present Illness  Pt is a 83 yr old male presenting with chest pain, dizziness, and diaphoresis. Admitted with unstable angina and underwent cardiac cath 8/16 revealing graft obstruction (medical management). PMH significant for CHF, AFib on anticoagulation, anemia, anxiety, CKD, HTN, h/o MI x2 and CABG.   Clinical Impression  Prior to admission, pt was mod I with RW for household mobility and basic ADLs.  Pt lives alone in a one-story home with 2 STE, and has 2 children close by who check-in daily and prepare his meals.  Currently, pt is supervision for sit <> stand and min guard for ambulation x 42ft with RW.  Pt with HR response to activity as follows: at rest 98 bpm, following seated TherEx 120 bpm, following 29ft of ambulation 159bpm.  Pt SOB (though SaO2 > 98%), but otherwise asymptomatic throughout entirety of session and denies any chest pain.  RN notified.  Pt presents with decreased cardiovascular tolerance for household mobility and is unsafe to d/c home alone.  Pt would benefit from skilled PT to address noted impairments and functional limitations.  Recommend pt discharge to STR when medically appropriate.     Follow Up Recommendations SNF    Equipment Recommendations   (pt owns necessary equipment)    Recommendations for Other Services       Precautions / Restrictions Precautions Precautions: Fall Restrictions Weight Bearing Restrictions: No      Mobility  Bed Mobility General bed mobility comments: Received in chair, returned to chair; not assessed.  Transfers Overall transfer level: Needs assistance Equipment used: Rolling walker (2 wheeled) Transfers: Sit to/from Stand Sit to Stand: Supervision General transfer comment: Pt able to achieve sit <> stand with heavy reliance on bilat UEs and increased time. No physical assist  required.  Ambulation/Gait Ambulation/Gait assistance: Min guard Ambulation Distance (Feet): 50 Feet Assistive device: Rolling walker (2 wheeled) Gait Pattern/deviations: Step-to pattern;Decreased dorsiflexion - right Gait velocity: Decreased General Gait Details: Pt compensates for decreased R dorsiflexion with increased R hip/knee flexion, reports this is his baseline x2-3 months. Pt with steadily increasing HR, reaching 159 bpm after 21ft (96 bpm at rest), and thus pt asked to turn around and ambulate the 47ft back to room. Further ambulation was deferred. Pt asymptomatic. Min guard for safety and increased time required.  Stairs    Wheelchair Mobility    Modified Rankin (Stroke Patients Only)       Balance Overall balance assessment: Needs assistance Sitting-balance support: Feet supported Sitting balance-[name]: Good Standing balance support: Bilateral upper extremity supported (on RW) Standing balance-[name]: Good     Pertinent Vitals/Pain Pain Assessment: No/denies pain  Pt visibly SOB with ambulation; O2 monitored throughout session and maintained WFL.     Home Living Family/patient expects to be discharged to:: Private residence Living Arrangements: Alone Available Help at Discharge: Family Type of Home: House Home Access: Stairs to enter Entrance Stairs-Rails: Can reach both Entrance Stairs-Number of Steps: 2 Home Layout: One level Home Equipment: [place] - 2 wheels;Tub bench;Bedside commode (life alert)      Prior Function Level of Independence: Independent with assistive device(s)  Comments: Pt performs household mobility and basic ADLs mod I with RW. Pt's daughter checks in daily and delivers meals. Pt's grandson is present any time he needs to shower. Does not leave the house much, daughter drives him when he does.  Extremity/Trunk Assessment   Upper Extremity Assessment: Generalized weakness   Lower Extremity Assessment:  Generalized weakness;RLE deficits/detail RLE Deficits / Details: Plantarflexion 3/5, dorsiflexion 1/5 (trace contraction noted). Pt reports this is baseline x2-3 months, attributed to spinal stenosis Light touch sensation intact bialt  Cervical / Trunk Assessment: Normal    Communication   Communication: HOH  Cognition Arousal/Alertness: Awake/alert Behavior During Therapy: WFL for tasks assessed/performed Overall Cognitive Status: Within Functional Limits for tasks assessed   General Comments Nursing cleared pt for participation in physical therapy.  Pt agreeable to PT session.     Exercises General Exercises - Lower Extremity Quad Sets: AROM;Both Long Arc Quad: AROM;Both Heel Slides: AROM;Both Hip ABduction/ADduction: AROM;Both Straight Leg Raises: AROM;Both Hip Flexion/Marching: AROM;Both  All exercises performed bilaterally x 10 reps in supine.       Assessment/Plan    PT Assessment Patient needs continued PT services  PT Diagnosis  (Decreased cardiovascular endurance/tolerance)   PT Problem List Decreased activity tolerance;Decreased mobility;Cardiopulmonary status limiting activity  PT Treatment Interventions Gait training;Stair training;Functional mobility training;Therapeutic activities;Therapeutic exercise;Patient/family education   PT Goals (Current goals can be found in the Care Plan section) Acute Rehab PT Goals Patient Stated Goal: To go home PT Goal Formulation: With patient Time For Goal Achievement: 01/13/16 Potential to Achieve Goals: Good    Frequency Min 2X/week   Barriers to discharge   Cardiovascular tolerance for activity below that required for household mobility.        End of Session Equipment Utilized During Treatment: Gait belt Activity Tolerance: Treatment limited secondary to medical complications (Comment) (Excessive increase in HR; RN notified) Patient left: in chair;with call bell/phone within reach;with chair alarm set Nurse  Communication: Mobility status;Precautions         Time: [id number] PT Time Calculation (min) (ACUTE ONLY): 28 min   Charges:         PT G Codes:        Magdalina Whitehead, SPT 12/30/2015, 1:22 PM

## 2015-12-30 NOTE — Clinical Social Work Placement (Signed)
   CLINICAL SOCIAL WORK PLACEMENT  NOTE  Date:  12/30/2015  Patient Details  Name: Andres Gutierrez MRN: [id number] Date of Birth: 1930-09-16  Clinical Social Work is seeking post-discharge placement for this patient at the [place] level of care (*CSW will initial, date and re-position this form in  chart as items are completed):  Yes   Patient/family provided with [place] Work Department's list of facilities offering this level of care within the geographic area requested by the patient (or if unable, by the patient's family).  Yes   Patient/family informed of their freedom to choose among providers that offer the needed level of care, that participate in Medicare, Medicaid or managed care program needed by the patient, have an available bed and are willing to accept the patient.  Yes   Patient/family informed of [place]'s ownership interest in [facility] and [facility], as well as of the fact that they are under no obligation to receive care at these facilities.  PASRR submitted to EDS on 12/30/15     PASRR number received on       Existing PASRR number confirmed on 12/30/15     FL2 transmitted to all facilities in geographic area requested by pt/family on 12/30/15     FL2 transmitted to all facilities within larger geographic area on       Patient informed that his/her managed care company has contracts with or will negotiate with certain facilities, including the following:            Patient/family informed of bed offers received.  Patient chooses bed at       Physician recommends and patient chooses bed at      Patient to be transferred to   on  .  Patient to be transferred to facility by       Patient family notified on   of transfer.  Name of family member notified:        PHYSICIAN Please sign FL2     Additional Comment:    _______________________________________________ Ross Ludwig 12/30/2015, 12:43  PM

## 2015-12-30 NOTE — Progress Notes (Signed)
Andres Gutierrez at [facility]                                                                                                                                                                                            Patient Demographics   Andres Gutierrez, is a 82 y.o. male, DOB - November 02, 1930, [id number]  Admit date - 12/26/2015   Admitting Physician Andres Costa, MD  Outpatient Primary MD for the patient is Andres Aus, MD   LOS - 4  Subjective: Patient doing better however with activity heart heart rate goes into the 150s. Denies any chest pain or palpitations   Review of Systems:   CONSTITUTIONAL: No documented fever. No fatigue, weakness. No weight gain, no weight loss.  EYES: No blurry or double vision.  ENT: No tinnitus. No postnasal drip. No redness of the oropharynx.  RESPIRATORY: No cough, no wheeze, no hemoptysis. No dyspnea.  CARDIOVASCULAR:no chest pain. No orthopnea. No palpitations. No syncope.  GASTROINTESTINAL: No nausea, no vomiting or diarrhea. No abdominal pain. No melena or hematochezia.  GENITOURINARY: No dysuria or hematuria.  ENDOCRINE: No polyuria or nocturia. No heat or cold intolerance.  HEMATOLOGY: No anemia. No bruising. No bleeding.  INTEGUMENTARY: No rashes. No lesions.  MUSCULOSKELETAL: No arthritis. No swelling. No gout.  NEUROLOGIC: No numbness, tingling, or ataxia. No seizure-type activity.  PSYCHIATRIC: No anxiety. No insomnia. No ADD.    Vitals:   Vitals:   12/30/15 0232 12/30/15 0453 12/30/15 0914 12/30/15 1058  BP: 113/68 103/62  98/65  Pulse: 100 97 (!) 117 99  Resp:  18  18  Temp:  97.5 F (36.4 C)  98.2 F (36.8 C)  TempSrc:  Oral  Oral  SpO2: 93% 96%  97%  Weight:  84.6 kg (186 lb 9.6 oz)    Height:        Wt Readings from Last 3 Encounters:  12/30/15 84.6 kg (186 lb 9.6 oz)  12/24/15 89.7 kg (197 lb 12.8 oz)  12/17/15 86.2 kg (190 lb)     Intake/Output Summary (Last 24 hours) at 12/30/15  1248 Last data filed at 12/30/15 1100  Gross per 24 hour  Intake              240 ml  Output             1300 ml  Net            -1060 ml    Physical Exam:   GENERAL: Pleasant-appearing in no apparent distress.  HEAD, EYES, EARS, NOSE AND THROAT: Atraumatic, normocephalic. Extraocular muscles are intact. Pupils equal  and reactive to light. Sclerae anicteric. No conjunctival injection. No oro-pharyngeal erythema.  NECK: Supple. There is no jugular venous distention. No bruits, no lymphadenopathy, no thyromegaly.  HEART: Regular rate and rhythm,. No murmurs, no rubs, no clicks.  LUNGS: Clear to auscultation bilaterally. No rales or rhonchi. No wheezes.  ABDOMEN: Soft, flat, nontender, nondistended. Has good bowel sounds. No hepatosplenomegaly appreciated.  EXTREMITIES: No evidence of any cyanosis, clubbing, or peripheral edema.  +2 pedal and radial pulses bilaterally.  NEUROLOGIC: The patient is alert, awake, and oriented x3 with no focal motor or sensory deficits appreciated bilaterally.  SKIN: Moist and warm with no rashes appreciated.  Psych: Not anxious, depressed LN: No inguinal LN enlargement    Antibiotics   Anti-infectives    None      Medications   Scheduled Meds: . apixaban  5 mg Oral BID  . aspirin EC  81 mg Oral Daily  . atorvastatin  40 mg Oral QHS  . digoxin  0.125 mg Oral Daily  . ferrous sulfate  325 mg Oral Daily  . metoprolol tartrate  25 mg Oral BID  . mupirocin ointment  1 application Topical TID  . pantoprazole  40 mg Oral BID AC  . potassium chloride  10 mEq Oral BID  . regadenoson  0.4 mg Intravenous Once  . sodium chloride flush  3 mL Intravenous Q12H  . sucralfate  1 g Oral TID WC & HS  . tamsulosin  0.4 mg Oral QPC supper  . ascorbic acid  250 mg Oral Daily   Continuous Infusions:   PRN Meds:.sodium chloride, acetaminophen **OR** acetaminophen, acetaminophen, HYDROcodone-acetaminophen, nitroGLYCERIN, ondansetron **OR** ondansetron (ZOFRAN) IV,  senna, senna-docusate, sodium chloride flush   Data Review:   Micro Results No results found for this or any previous visit (from the past 240 hour(s)).  Radiology Reports Dg Chest 2 View  Result Date: 12/23/2015 CLINICAL DATA:  82 year old male with shortness of breath since this morning. Initial encounter. Former smoker. EXAM: CHEST  2 VIEW COMPARISON:  Chest CTA 10/18/2015 and earlier. FINDINGS: Sequelae of CABG. Stable mild cardiomegaly. Other mediastinal contours are within normal limits. Visualized tracheal air column is within normal limits. Right pleural effusion appears resolved since the June comparison. Mild eventration of the right hemidiaphragm. No pneumothorax, pulmonary edema, pleural effusion or confluent pulmonary opacity. No acute osseous abnormality identified. IMPRESSION: Right pleural effusion seen in June seems resolved. No acute cardiopulmonary abnormality. Electronically Signed   By: Genevie Ann M.D.   On: 12/23/2015 19:49   Nm Myocar Multi W/spect W/wall Motion / Ef  Result Date: 12/29/2015  Blood pressure demonstrated a normal response to exercise.  There was no ST segment deviation noted during stress.  Defect 1: There is a small defect of mild severity present in the apical anterior location.  Findings consistent with ischemia.  This is an intermediate risk study.  The left ventricular ejection fraction is severely decreased (<30%).    Dg Chest Port 1 View  Result Date: 12/26/2015 CLINICAL DATA:  Shortness of breath for several days with dizziness. History of atrial fibrillation, CAD and CABG. EXAM: PORTABLE CHEST 1 VIEW COMPARISON:  12/23/2015 radiographs.  CT 10/28/2015 FINDINGS: 0936 hours. Stable mild cardiac enlargement status post CABG. The pulmonary vascularity is normal. There is no edema, confluent airspace opacity, pleural effusion or pneumothorax. Old left-sided rib fracture noted. No acute osseous findings are seen. IMPRESSION: Stable postoperative chest.   No acute cardiopulmonary process. Electronically Signed   By: Richardean Sale  M.D.   On: 12/26/2015 10:08   Dg Foot Complete Right  Result Date: 12/17/2015 CLINICAL DATA:  Swelling with no known cause. EXAM: RIGHT FOOT COMPLETE - 3+ VIEW COMPARISON:  None. FINDINGS: Healed distal second metatarsal fracture with an overlapping screw. Unusual configuration to the proximal second phalanx has a chronic appearance and is likely from previous trauma. Subluxation at the second MTP joint, likely from remote trauma. No other acute bony abnormalities are identified. Diffuse soft tissue swelling is seen with no underlying cause identified. No bony erosion. IMPRESSION: Soft tissue swelling. No acute abnormalities. Remote posttraumatic changes as detailed above. Electronically Signed   By: Dorise Bullion III M.D   On: 12/17/2015 10:46     CBC  Recent Labs Lab 12/24/15 0419 12/26/15 0951 12/27/15 0341 12/28/15 0458 12/29/15 0550  WBC 7.1 9.4 8.1 11.2* 6.4  HGB 11.1* 12.1* 11.8* 13.3 12.4*  HCT 31.1* 34.4* 33.8* 39.2* 34.5*  PLT 189 228 216 263 231  MCV 93.8 93.1 94.3 95.5 98.1  MCH 33.4 32.8 32.8 32.3 35.3*  MCHC 35.7 35.2 34.8 33.8 36.0  RDW 16.7* 17.0* 17.1* 17.3* 17.2*    Chemistries   Recent Labs Lab 12/24/15 0419 12/26/15 0951 12/30/15 0422  NA 137 136 134*  K 4.2 3.7 3.8  CL 102 105 102  CO2 27 24 22   GLUCOSE 109* 111* 104*  BUN 25* 27* 26*  CREATININE 1.18 1.18 1.09  CALCIUM 8.7* 9.1 8.6*  AST  --  30  --   ALT  --  19  --   ALKPHOS  --  75  --   BILITOT  --  1.1  --    ------------------------------------------------------------------------------------------------------------------ estimated creatinine clearance is 62.5 mL/min (by C-G formula based on SCr of 1.09 mg/dL). ------------------------------------------------------------------------------------------------------------------ No results for input(s): HGBA1C in the last 72  hours. ------------------------------------------------------------------------------------------------------------------ No results for input(s): CHOL, HDL, LDLCALC, TRIG, CHOLHDL, LDLDIRECT in the last 72 hours. ------------------------------------------------------------------------------------------------------------------ No results for input(s): TSH, T4TOTAL, T3FREE, THYROIDAB in the last 72 hours.  Invalid input(s): FREET3 ------------------------------------------------------------------------------------------------------------------ No results for input(s): VITAMINB12, FOLATE, FERRITIN, TIBC, IRON, RETICCTPCT in the last 72 hours.  Coagulation profile  Recent Labs Lab 12/23/15 1921 12/26/15 1426 12/28/15 1001  INR 1.18 1.34 1.06    No results for input(s): DDIMER in the last 72 hours.  Cardiac Enzymes  Recent Labs Lab 12/26/15 1356 12/26/15 1902 12/27/15 0120  TROPONINI 0.05* 0.05* 0.05*   ------------------------------------------------------------------------------------------------------------------ Invalid input(s): POCBNP    Assessment & Plan   82 year old male with a history of chronic atrial fibrillation on anticoagulation, ASCVD and chronic systolic heart failure who presents with unstable angina.  1. Obstructed graft Status post cardiac catheter and stress test medical management recommended per cardiology  2. Chronic atrial fibrillation: With poor control with activity His blood pressures low borderline I will have to stop his ACE inhibitor and Coreg I will start him on metoprolol for better heart rate control continue digoxin We will try these medications to see if that helps with his heart rate with activity  3. Chronic systolic heart failure EF of 25-30%: Due to borderline blood pressure and need to control his heart rate I will have to discontinue his Lasix lisinopril and Coreg  4. Hyperlipidemia: Continue Lipitor.  5. ASCVD: Continue  atorvastatin, aspirin,      Code Status Orders        Start     Ordered   12/26/15 1317  Full code  Continuous     12/26/15 1317  Code Status History    Date Active Date Inactive Code Status Order ID Comments User Context   12/23/2015 10:46 PM 12/24/2015  8:35 AM Full Code [id number]  Lance Coon, MD Inpatient   11/03/2015  5:58 PM 11/05/2015  7:05 PM Full Code [id number]  Lytle Butte, MD ED   10/29/2015  1:08 AM 11/01/2015  4:05 PM Full Code [id number]  Alesia Richards, MD ED   12/22/2014  2:23 AM 12/24/2014  2:13 PM Full Code [id number]  Juluis Mire, MD Inpatient           Consults  61min   DVT Prophylaxis  heparin  Lab Results  Component Value Date   PLT 231 12/29/2015     Time Spent in minutes  56min  Greater than 50% of time spent in care coordination and counseling patient regarding the condition and plan of care.   Dustin Flock M.D on 12/30/2015 at 12:48 PM  Between 7am to 6pm - Pager - [phone]  After 6pm go to [email or web address] - password [email or web address] [facility]   Office  [phone]

## 2015-12-30 NOTE — Care Management Important Message (Signed)
Important Message  Patient Details  Name: Andres Gutierrez MRN: [id number] Date of Birth: 08-Dec-1930   Medicare Important Message Given:  Yes    Katrina Stack, RN 12/30/2015, 12:10 PM

## 2015-12-30 NOTE — Care Management (Addendum)
Patient is currently followed by [place] physical therapy and aide.  spoke with Corene Cornea with [facility] and discussed the need to add nursing to the referral.  Was contacted by the attending and informed that physical therapy contacted him and recommending skilled nursing placement due to increased heart rate with exertion.  At present, the PT evaluation has not been documented.  Patient would prefer to go home but is open to consider short term SNF.  Will require prior authorization by patient's insurance.  made [facility] referral

## 2015-12-30 NOTE — Care Management (Signed)
Spoke with attending regarding  physical therapy recommendation for snf and discussed addressing heart rate issues while inpatient.  Discussed the two discharge plans:  Revision in medication regime to better control heart rate;  CSW will perform bed search and initiate auth for an inpatient stay vs discharge home with home health nursing (to see within 24 hours of discharge) and PT and Aide.  If insurance authorizes stay could discharge to home with the home health or to the skilled nursing facility 8/19  if medically stable

## 2015-12-31 DIAGNOSIS — E785 Hyperlipidemia, unspecified: Secondary | ICD-10-CM | POA: Diagnosis not present

## 2015-12-31 DIAGNOSIS — I5022 Chronic systolic (congestive) heart failure: Secondary | ICD-10-CM | POA: Diagnosis not present

## 2015-12-31 DIAGNOSIS — I200 Unstable angina: Secondary | ICD-10-CM | POA: Diagnosis not present

## 2015-12-31 DIAGNOSIS — I4891 Unspecified atrial fibrillation: Secondary | ICD-10-CM | POA: Diagnosis not present

## 2015-12-31 MED ORDER — FUROSEMIDE 20 MG PO TABS
20.0000 mg | ORAL_TABLET | Freq: Every day | ORAL | Status: DC
  Administered 2015-12-31: 20 mg via ORAL
  Filled 2015-12-31: qty 1

## 2015-12-31 MED ORDER — METOPROLOL TARTRATE 25 MG PO TABS: 25.0000 mg | ORAL_TABLET | Freq: Two times a day (BID) | ORAL | 0 refills | Status: DC

## 2015-12-31 MED ORDER — DIGOXIN 250 MCG PO TABS
0.2500 mg | ORAL_TABLET | Freq: Every day | ORAL | Status: DC
  Administered 2015-12-31 – 2016-01-01 (×2): 0.25 mg via ORAL
  Filled 2015-12-31 (×2): qty 1

## 2015-12-31 NOTE — Clinical Social Work Note (Signed)
CSW contacted patient's daughter Tiana Loft [phone]) concerning bed offers. Patient's family selected [facility] in [place] as the primary choice. CSW will con't to follow.  Santiago Bumpers, MSW, LCSW-A [phone]

## 2015-12-31 NOTE — Discharge Instructions (Signed)

## 2015-12-31 NOTE — Progress Notes (Signed)
Pt up to [facility] with walker, HR maintained between 90-100s.

## 2015-12-31 NOTE — Progress Notes (Signed)
[place] at [facility]                                                                                                                                                                                            Patient Demographics   Dywayne Gameros, is a 82 y.o. male, DOB - 12/06/1930, [id number]  Admit date - 12/26/2015   Admitting Physician Bettey Costa, MD  Outpatient Primary MD for the patient is Rusty Aus, MD   LOS - 5  Subjective:  HR better. No SOB   Review of Systems:   CONSTITUTIONAL: No documented fever. No fatigue, weakness. No weight gain, no weight loss.  EYES: No blurry or double vision.  ENT: No tinnitus. No postnasal drip. No redness of the oropharynx.  RESPIRATORY: No cough, no wheeze, no hemoptysis. No dyspnea.  CARDIOVASCULAR:no chest pain. No orthopnea. No palpitations. No syncope.  GASTROINTESTINAL: No nausea, no vomiting or diarrhea. No abdominal pain. No melena or hematochezia.  GENITOURINARY: No dysuria or hematuria.  ENDOCRINE: No polyuria or nocturia. No heat or cold intolerance.  HEMATOLOGY: No anemia. No bruising. No bleeding.  INTEGUMENTARY: No rashes. No lesions.  MUSCULOSKELETAL: No arthritis. No swelling. No gout.  NEUROLOGIC: No numbness, tingling, or ataxia. No seizure-type activity.  PSYCHIATRIC: No anxiety. No insomnia. No ADD.    Vitals:   Vitals:   12/31/15 0432 12/31/15 0813 12/31/15 1121 12/31/15 2055  BP: 105/62 104/64 (!) 107/55 (!) 107/58  Pulse: 87 92 79 (!) 107  Resp: 18  20 (!) 28  Temp: 98.1 F (36.7 C)  97.4 F (36.3 C) 99.3 F (37.4 C)  TempSrc: Oral  Oral Oral  SpO2: 95%  97% 97%  Weight: 84 kg (185 lb 3.2 oz)     Height:        Wt Readings from Last 3 Encounters:  12/31/15 84 kg (185 lb 3.2 oz)  12/24/15 89.7 kg (197 lb 12.8 oz)  12/17/15 86.2 kg (190 lb)     Intake/Output Summary (Last 24 hours) at 12/31/15 2058 Last data filed at 12/31/15 1910  Gross per 24 hour   Intake              240 ml  Output             1495 ml  Net            -1255 ml    Physical Exam:   GENERAL: Pleasant-appearing in no apparent distress.  HEAD, EYES, EARS, NOSE AND THROAT: Atraumatic, normocephalic. Extraocular muscles are intact. Pupils equal and reactive to light. Sclerae anicteric. No conjunctival  injection. No oro-pharyngeal erythema.  NECK: Supple. There is no jugular venous distention. No bruits, no lymphadenopathy, no thyromegaly.  HEART: Regular rate and rhythm,. No murmurs, no rubs, no clicks.  LUNGS: Clear to auscultation bilaterally. No rales or rhonchi. No wheezes.  ABDOMEN: Soft, flat, nontender, nondistended. Has good bowel sounds. No hepatosplenomegaly appreciated.  EXTREMITIES: No evidence of any cyanosis, clubbing, or peripheral edema.  +2 pedal and radial pulses bilaterally.  NEUROLOGIC: The patient is alert, awake, and oriented x3 with no focal motor or sensory deficits appreciated bilaterally.  SKIN: Moist and warm with no rashes appreciated.  Psych: Not anxious, depressed LN: No inguinal LN enlargement    Antibiotics   Anti-infectives    None      Medications   Scheduled Meds: . apixaban  5 mg Oral BID  . aspirin EC  81 mg Oral Daily  . atorvastatin  40 mg Oral QHS  . digoxin  0.25 mg Oral Daily  . ferrous sulfate  325 mg Oral Daily  . metoprolol tartrate  25 mg Oral BID  . mupirocin ointment  1 application Topical TID  . pantoprazole  40 mg Oral BID AC  . potassium chloride  10 mEq Oral BID  . regadenoson  0.4 mg Intravenous Once  . sodium chloride flush  3 mL Intravenous Q12H  . sucralfate  1 g Oral TID WC & HS  . tamsulosin  0.4 mg Oral QPC supper  . ascorbic acid  250 mg Oral Daily   Continuous Infusions:   PRN Meds:.sodium chloride, acetaminophen **OR** acetaminophen, acetaminophen, HYDROcodone-acetaminophen, nitroGLYCERIN, ondansetron **OR** ondansetron (ZOFRAN) IV, senna, senna-docusate, sodium chloride flush   Data  Review:   Micro Results No results found for this or any previous visit (from the past 240 hour(s)).  Radiology Reports Dg Chest 2 View  Result Date: 12/23/2015 CLINICAL DATA:  82 year old male with shortness of breath since this morning. Initial encounter. Former smoker. EXAM: CHEST  2 VIEW COMPARISON:  Chest CTA 10/18/2015 and earlier. FINDINGS: Sequelae of CABG. Stable mild cardiomegaly. Other mediastinal contours are within normal limits. Visualized tracheal air column is within normal limits. Right pleural effusion appears resolved since the June comparison. Mild eventration of the right hemidiaphragm. No pneumothorax, pulmonary edema, pleural effusion or confluent pulmonary opacity. No acute osseous abnormality identified. IMPRESSION: Right pleural effusion seen in June seems resolved. No acute cardiopulmonary abnormality. Electronically Signed   By: Genevie Ann M.D.   On: 12/23/2015 19:49   Nm Myocar Multi W/spect W/wall Motion / Ef  Result Date: 12/29/2015  Blood pressure demonstrated a normal response to exercise.  There was no ST segment deviation noted during stress.  Defect 1: There is a small defect of mild severity present in the apical anterior location.  Findings consistent with ischemia.  This is an intermediate risk study.  The left ventricular ejection fraction is severely decreased (<30%).    Dg Chest Port 1 View  Result Date: 12/26/2015 CLINICAL DATA:  Shortness of breath for several days with dizziness. History of atrial fibrillation, CAD and CABG. EXAM: PORTABLE CHEST 1 VIEW COMPARISON:  12/23/2015 radiographs.  CT 10/28/2015 FINDINGS: 0936 hours. Stable mild cardiac enlargement status post CABG. The pulmonary vascularity is normal. There is no edema, confluent airspace opacity, pleural effusion or pneumothorax. Old left-sided rib fracture noted. No acute osseous findings are seen. IMPRESSION: Stable postoperative chest.  No acute cardiopulmonary process. Electronically  Signed   By: Richardean Sale M.D.   On: 12/26/2015 10:08  Dg Foot Complete Right  Result Date: 12/17/2015 CLINICAL DATA:  Swelling with no known cause. EXAM: RIGHT FOOT COMPLETE - 3+ VIEW COMPARISON:  None. FINDINGS: Healed distal second metatarsal fracture with an overlapping screw. Unusual configuration to the proximal second phalanx has a chronic appearance and is likely from previous trauma. Subluxation at the second MTP joint, likely from remote trauma. No other acute bony abnormalities are identified. Diffuse soft tissue swelling is seen with no underlying cause identified. No bony erosion. IMPRESSION: Soft tissue swelling. No acute abnormalities. Remote posttraumatic changes as detailed above. Electronically Signed   By: Dorise Bullion III M.D   On: 12/17/2015 10:46     CBC  Recent Labs Lab 12/26/15 [id number] 12/27/15 0341 12/28/15 0458 12/29/15 0550  WBC 9.4 8.1 11.2* 6.4  HGB 12.1* 11.8* 13.3 12.4*  HCT 34.4* 33.8* 39.2* 34.5*  PLT 228 216 263 231  MCV 93.1 94.3 95.5 98.1  MCH 32.8 32.8 32.3 35.3*  MCHC 35.2 34.8 33.8 36.0  RDW 17.0* 17.1* 17.3* 17.2*    Chemistries   Recent Labs Lab 12/26/15 0951 12/30/15 0422  NA 136 134*  K 3.7 3.8  CL 105 102  CO2 24 22  GLUCOSE 111* 104*  BUN 27* 26*  CREATININE 1.18 1.09  CALCIUM 9.1 8.6*  AST 30  --   ALT 19  --   ALKPHOS 75  --   BILITOT 1.1  --    ------------------------------------------------------------------------------------------------------------------ estimated creatinine clearance is 62.1 mL/min (by C-G formula based on SCr of 1.09 mg/dL). ------------------------------------------------------------------------------------------------------------------ No results for input(s): HGBA1C in the last 72 hours. ------------------------------------------------------------------------------------------------------------------ No results for input(s): CHOL, HDL, LDLCALC, TRIG, CHOLHDL, LDLDIRECT in the last 72  hours. ------------------------------------------------------------------------------------------------------------------ No results for input(s): TSH, T4TOTAL, T3FREE, THYROIDAB in the last 72 hours.  Invalid input(s): FREET3 ------------------------------------------------------------------------------------------------------------------ No results for input(s): VITAMINB12, FOLATE, FERRITIN, TIBC, IRON, RETICCTPCT in the last 72 hours.  Coagulation profile  Recent Labs Lab 12/26/15 1426 12/28/15 1001  INR 1.34 1.06    No results for input(s): DDIMER in the last 72 hours.  Cardiac Enzymes  Recent Labs Lab 12/26/15 1356 12/26/15 1902 12/27/15 0120  TROPONINI 0.05* 0.05* 0.05*   ------------------------------------------------------------------------------------------------------------------ Invalid input(s): POCBNP    Assessment & Plan   82 year old male with a history of chronic atrial fibrillation on anticoagulation, ASCVD and chronic systolic heart failure who presents with unstable angina.  1. CAD s/p CABG with Obstructed graft - Status post cardiac catheter and stress test. medical management recommended per cardiology  2. Chronic atrial fibrillation: With poor control with activity. ACE inhibitor and Coreg stopped Started on metoprolol. Increase digoxin Improved  3. Chronic systolic heart failure EF of 25-30%: Due to borderline blood pressure and need to control his heart rate I will have to discontinue his Lasix, lisinopril and Coreg  4. Hyperlipidemia: Continue Lipitor.  5. ASCVD: Continue atorvastatin, aspirin,      Code Status Orders        Start     Ordered   12/26/15 1317  Full code  Continuous     12/26/15 1317    Code Status History    Date Active Date Inactive Code Status Order ID Comments User Context   12/23/2015 10:46 PM 12/24/2015  8:35 AM Full Code [id number]  Lance Coon, MD Inpatient   11/03/2015  5:58 PM 11/05/2015  7:05 PM Full  Code [id number]  Lytle Butte, MD ED   10/29/2015  1:08 AM 11/01/2015  4:05 PM Full Code [id number]  Jeneen Rinks  Conley Canal, MD ED   12/22/2014  2:23 AM 12/24/2014  2:13 PM Full Code [id number]  Juluis Mire, MD Inpatient     Consults  Cardiology  DVT Prophylaxis  heparin  Lab Results  Component Value Date   PLT 231 12/29/2015    Time Spent in minutes  27min  Hillary Bow R M.D on 12/31/2015 at 8:58 PM  Between 7am to 6pm - Pager - [phone]  After 6pm go to [email or web address] - password [email or web address] [place] [facility]   Office  [phone]

## 2016-01-01 ENCOUNTER — Inpatient Hospital Stay: Payer: PPO

## 2016-01-01 DIAGNOSIS — I200 Unstable angina: Secondary | ICD-10-CM | POA: Diagnosis not present

## 2016-01-01 DIAGNOSIS — I5022 Chronic systolic (congestive) heart failure: Secondary | ICD-10-CM | POA: Diagnosis not present

## 2016-01-01 DIAGNOSIS — E785 Hyperlipidemia, unspecified: Secondary | ICD-10-CM | POA: Diagnosis not present

## 2016-01-01 DIAGNOSIS — R1110 Vomiting, unspecified: Secondary | ICD-10-CM | POA: Diagnosis not present

## 2016-01-01 DIAGNOSIS — R112 Nausea with vomiting, unspecified: Secondary | ICD-10-CM | POA: Diagnosis not present

## 2016-01-01 LAB — DIGOXIN LEVEL: DIGOXIN LVL: 2.2 ng/mL — AB (ref 0.8–2.0)

## 2016-01-01 MED ORDER — FAMOTIDINE IN NACL 20-0.9 MG/50ML-% IV SOLN
20.0000 mg | Freq: Two times a day (BID) | INTRAVENOUS | Status: DC
  Administered 2016-01-01 (×2): 20 mg via INTRAVENOUS
  Filled 2016-01-01 (×4): qty 50

## 2016-01-01 MED ORDER — PANTOPRAZOLE SODIUM 40 MG PO TBEC: 40.0000 mg | DELAYED_RELEASE_TABLET | Freq: Two times a day (BID) | ORAL | Status: DC

## 2016-01-01 MED ORDER — DIGOXIN 125 MCG PO TABS: 0.1250 mg | ORAL_TABLET | Freq: Every day | ORAL | Status: DC

## 2016-01-01 NOTE — Progress Notes (Addendum)
Patient experiencing some nausea and vomiting, RN noted that digoxin dose has been increased since admission. RN asked Dr. Darvin Neighbours if checking serum digoxin level would be appropriate, MD placed orders. Also added IV pepcid for suspected GERD. Will continue to monitor.

## 2016-01-01 NOTE — Progress Notes (Signed)
Attempted to get patient up to walk and patient refused.

## 2016-01-01 NOTE — Progress Notes (Signed)
[place] at [facility]                                                                                                                                                                                            Patient Demographics   Andres Gutierrez, is a 82 y.o. male, DOB - [birth year], [id number]  Admit date - 12/26/2015   Admitting Physician Bettey Costa, MD  Outpatient Primary MD for the patient is Rusty Aus, MD   LOS - 6  Subjective:  HR better. No SOB.  Some nausea. One episode of vomiting. Mild epigastric pain.   Review of Systems:   CONSTITUTIONAL: No documented fever. No fatigue, weakness. No weight gain, no weight loss.  EYES: No blurry or double vision.  ENT: No tinnitus. No postnasal drip. No redness of the oropharynx.  RESPIRATORY: No cough, no wheeze, no hemoptysis. No dyspnea.  CARDIOVASCULAR:no chest pain. No orthopnea. No palpitations. No syncope.  GASTROINTESTINAL: No nausea, no vomiting or diarrhea. No abdominal pain. No melena or hematochezia.  GENITOURINARY: No dysuria or hematuria.  ENDOCRINE: No polyuria or nocturia. No heat or cold intolerance.  HEMATOLOGY: No anemia. No bruising. No bleeding.  INTEGUMENTARY: No rashes. No lesions.  MUSCULOSKELETAL: No arthritis. No swelling. No gout.  NEUROLOGIC: No numbness, tingling, or ataxia. No seizure-type activity.  PSYCHIATRIC: No anxiety. No insomnia. No ADD.    Vitals:   Vitals:   01/01/16 0618 01/01/16 0840 01/01/16 1134 01/01/16 1137  BP: 109/61 118/60 (!) 87/49 130/60  Pulse: 93 90 86 85  Resp: 16 18 17    Temp: 98.7 F (37.1 C) 97.5 F (36.4 C) 98.4 F (36.9 C)   TempSrc: Oral Oral    SpO2: 97% 98% 97%   Weight: 83.2 kg (183 lb 6.4 oz)     Height:        Wt Readings from Last 3 Encounters:  01/01/16 83.2 kg (183 lb 6.4 oz)  12/24/15 89.7 kg (197 lb 12.8 oz)  12/17/15 86.2 kg (190 lb)     Intake/Output Summary (Last 24 hours) at 01/01/16 1145 Last  data filed at 01/01/16 0900  Gross per 24 hour  Intake              200 ml  Output             1400 ml  Net            -1200 ml    Physical Exam:   GENERAL: Pleasant-appearing in no apparent distress.  HEAD, EYES, EARS, NOSE AND THROAT: Atraumatic, normocephalic. Extraocular muscles are intact. Pupils equal and  reactive to light. Sclerae anicteric. No conjunctival injection. No oro-pharyngeal erythema.  NECK: Supple. There is no jugular venous distention. No bruits, no lymphadenopathy, no thyromegaly.  HEART: Regular rate and rhythm,. No murmurs, no rubs, no clicks.  LUNGS: Clear to auscultation bilaterally. No rales or rhonchi. No wheezes.  ABDOMEN: Soft, flat, nontender, nondistended. Has good bowel sounds. No hepatosplenomegaly appreciated.  EXTREMITIES: No evidence of any cyanosis, clubbing, or peripheral edema.  +2 pedal and radial pulses bilaterally.  NEUROLOGIC: The patient is alert, awake, and oriented x3 with no focal motor or sensory deficits appreciated bilaterally.  SKIN: Moist and warm with no rashes appreciated.  Psych: Not anxious, depressed LN: No inguinal LN enlargement    Antibiotics   Anti-infectives    None      Medications   Scheduled Meds: . apixaban  5 mg Oral BID  . aspirin EC  81 mg Oral Daily  . atorvastatin  40 mg Oral QHS  . digoxin  0.25 mg Oral Daily  . famotidine (PEPCID) IV  20 mg Intravenous Q12H  . ferrous sulfate  325 mg Oral Daily  . metoprolol tartrate  25 mg Oral BID  . mupirocin ointment  1 application Topical TID  . potassium chloride  10 mEq Oral BID  . regadenoson  0.4 mg Intravenous Once  . sodium chloride flush  3 mL Intravenous Q12H  . sucralfate  1 g Oral TID WC & HS  . tamsulosin  0.4 mg Oral QPC supper  . ascorbic acid  250 mg Oral Daily   Continuous Infusions:   PRN Meds:.sodium chloride, acetaminophen **OR** acetaminophen, acetaminophen, HYDROcodone-acetaminophen, nitroGLYCERIN, ondansetron **OR** ondansetron (ZOFRAN)  IV, senna, senna-docusate, sodium chloride flush   Data Review:   Micro Results No results found for this or any previous visit (from the past 240 hour(s)).  Radiology Reports Dg Chest 2 View  Result Date: 12/23/2015 CLINICAL DATA:  82 year old male with shortness of breath since this morning. Initial encounter. Former smoker. EXAM: CHEST  2 VIEW COMPARISON:  Chest CTA 10/18/2015 and earlier. FINDINGS: Sequelae of CABG. Stable mild cardiomegaly. Other mediastinal contours are within normal limits. Visualized tracheal air column is within normal limits. Right pleural effusion appears resolved since the June comparison. Mild eventration of the right hemidiaphragm. No pneumothorax, pulmonary edema, pleural effusion or confluent pulmonary opacity. No acute osseous abnormality identified. IMPRESSION: Right pleural effusion seen in June seems resolved. No acute cardiopulmonary abnormality. Electronically Signed   By: Genevie Ann M.D.   On: 12/23/2015 19:49   Nm Myocar Multi W/spect W/wall Motion / Ef  Result Date: 12/29/2015  Blood pressure demonstrated a normal response to exercise.  There was no ST segment deviation noted during stress.  Defect 1: There is a small defect of mild severity present in the apical anterior location.  Findings consistent with ischemia.  This is an intermediate risk study.  The left ventricular ejection fraction is severely decreased (<30%).    Dg Chest Port 1 View  Result Date: 12/26/2015 CLINICAL DATA:  Shortness of breath for several days with dizziness. History of atrial fibrillation, CAD and CABG. EXAM: PORTABLE CHEST 1 VIEW COMPARISON:  12/23/2015 radiographs.  CT 10/28/2015 FINDINGS: 0936 hours. Stable mild cardiac enlargement status post CABG. The pulmonary vascularity is normal. There is no edema, confluent airspace opacity, pleural effusion or pneumothorax. Old left-sided rib fracture noted. No acute osseous findings are seen. IMPRESSION: Stable postoperative  chest.  No acute cardiopulmonary process. Electronically Signed   By: Richardean Sale  M.D.   On: 12/26/2015 10:08   Dg Foot Complete Right  Result Date: 12/17/2015 CLINICAL DATA:  Swelling with no known cause. EXAM: RIGHT FOOT COMPLETE - 3+ VIEW COMPARISON:  None. FINDINGS: Healed distal second metatarsal fracture with an overlapping screw. Unusual configuration to the proximal second phalanx has a chronic appearance and is likely from previous trauma. Subluxation at the second MTP joint, likely from remote trauma. No other acute bony abnormalities are identified. Diffuse soft tissue swelling is seen with no underlying cause identified. No bony erosion. IMPRESSION: Soft tissue swelling. No acute abnormalities. Remote posttraumatic changes as detailed above. Electronically Signed   By: Dorise Bullion III M.D   On: 12/17/2015 10:46     CBC  Recent Labs Lab 12/26/15 [id number] 12/27/15 0341 12/28/15 0458 12/29/15 0550  WBC 9.4 8.1 11.2* 6.4  HGB 12.1* 11.8* 13.3 12.4*  HCT 34.4* 33.8* 39.2* 34.5*  PLT 228 216 263 231  MCV 93.1 94.3 95.5 98.1  MCH 32.8 32.8 32.3 35.3*  MCHC 35.2 34.8 33.8 36.0  RDW 17.0* 17.1* 17.3* 17.2*    Chemistries   Recent Labs Lab 12/26/15 0951 12/30/15 0422  NA 136 134*  K 3.7 3.8  CL 105 102  CO2 24 22  GLUCOSE 111* 104*  BUN 27* 26*  CREATININE 1.18 1.09  CALCIUM 9.1 8.6*  AST 30  --   ALT 19  --   ALKPHOS 75  --   BILITOT 1.1  --    ------------------------------------------------------------------------------------------------------------------ estimated creatinine clearance is 61.5 mL/min (by C-G formula based on SCr of 1.09 mg/dL). ------------------------------------------------------------------------------------------------------------------ No results for input(s): HGBA1C in the last 72 hours. ------------------------------------------------------------------------------------------------------------------ No results for input(s): CHOL, HDL,  LDLCALC, TRIG, CHOLHDL, LDLDIRECT in the last 72 hours. ------------------------------------------------------------------------------------------------------------------ No results for input(s): TSH, T4TOTAL, T3FREE, THYROIDAB in the last 72 hours.  Invalid input(s): FREET3 ------------------------------------------------------------------------------------------------------------------ No results for input(s): VITAMINB12, FOLATE, FERRITIN, TIBC, IRON, RETICCTPCT in the last 72 hours.  Coagulation profile  Recent Labs Lab 12/26/15 1426 12/28/15 1001  INR 1.34 1.06    No results for input(s): DDIMER in the last 72 hours.  Cardiac Enzymes  Recent Labs Lab 12/26/15 1356 12/26/15 1902 12/27/15 0120  TROPONINI 0.05* 0.05* 0.05*   ------------------------------------------------------------------------------------------------------------------ Invalid input(s): POCBNP    Assessment & Plan   82 year old male with a history of chronic atrial fibrillation on anticoagulation, ASCVD and chronic systolic heart failure who presents with unstable angina.  1. CAD s/p CABG with Obstructed graft Status post cardiac catheter and stress test. medical management recommended per cardiology  2. Chronic atrial fibrillation: With poor control with activity. ACE inhibitor and Coreg stopped Started on metoprolol. Increase digoxin Improved  3. Chronic systolic heart failure EF of 25-30% Due to borderline blood pressure and need to control his heart rate, discontinue his Lasix, lisinopril and Coreg  4. Hyperlipidemia: Continue Lipitor.  5. ASCVD: Continue atorvastatin, aspirin  6. GERD Likely cause of his nausea/vomiting. We'll check an abdominal x-ray. Start IV Pepcid twice a day. Zofran as needed.     Code Status Orders        Start     Ordered   12/26/15 1317  Full code  Continuous     12/26/15 1317    Code Status History    Date Active Date Inactive Code Status Order  ID Comments User Context   12/23/2015 10:46 PM 12/24/2015  8:35 AM Full Code [id number]  Lance Coon, MD Inpatient   11/03/2015  5:58 PM 11/05/2015  7:05 PM  Full Code [id number]  Lytle Butte, MD ED   10/29/2015  1:08 AM 11/01/2015  4:05 PM Full Code [id number]  Alesia Richards, MD ED   12/22/2014  2:23 AM 12/24/2014  2:13 PM Full Code [id number]  Juluis Mire, MD Inpatient     Consults  Cardiology  DVT Prophylaxis  heparin  Lab Results  Component Value Date   PLT 231 12/29/2015    Time Spent in minutes  25 min  Hillary Bow R M.D on 01/01/2016 at 11:45 AM  Between 7am to 6pm - Pager - [phone]  After 6pm go to [email or web address] - password [email or web address] [place] [facility]   Office  [phone]

## 2016-01-02 DIAGNOSIS — I5022 Chronic systolic (congestive) heart failure: Secondary | ICD-10-CM | POA: Diagnosis not present

## 2016-01-02 DIAGNOSIS — E782 Mixed hyperlipidemia: Secondary | ICD-10-CM | POA: Diagnosis not present

## 2016-01-02 DIAGNOSIS — I10 Essential (primary) hypertension: Secondary | ICD-10-CM | POA: Diagnosis not present

## 2016-01-02 DIAGNOSIS — I480 Paroxysmal atrial fibrillation: Secondary | ICD-10-CM | POA: Diagnosis not present

## 2016-01-02 DIAGNOSIS — N400 Enlarged prostate without lower urinary tract symptoms: Secondary | ICD-10-CM | POA: Diagnosis not present

## 2016-01-02 DIAGNOSIS — I509 Heart failure, unspecified: Secondary | ICD-10-CM | POA: Diagnosis not present

## 2016-01-02 DIAGNOSIS — K219 Gastro-esophageal reflux disease without esophagitis: Secondary | ICD-10-CM | POA: Diagnosis not present

## 2016-01-02 DIAGNOSIS — M6281 Muscle weakness (generalized): Secondary | ICD-10-CM | POA: Diagnosis not present

## 2016-01-02 DIAGNOSIS — R262 Difficulty in walking, not elsewhere classified: Secondary | ICD-10-CM | POA: Diagnosis not present

## 2016-01-02 DIAGNOSIS — I200 Unstable angina: Secondary | ICD-10-CM | POA: Diagnosis not present

## 2016-01-02 DIAGNOSIS — I2510 Atherosclerotic heart disease of native coronary artery without angina pectoris: Secondary | ICD-10-CM | POA: Diagnosis not present

## 2016-01-02 DIAGNOSIS — R6889 Other general symptoms and signs: Secondary | ICD-10-CM | POA: Diagnosis not present

## 2016-01-02 DIAGNOSIS — E785 Hyperlipidemia, unspecified: Secondary | ICD-10-CM | POA: Diagnosis not present

## 2016-01-02 DIAGNOSIS — Z741 Need for assistance with personal care: Secondary | ICD-10-CM | POA: Diagnosis not present

## 2016-01-02 DIAGNOSIS — R079 Chest pain, unspecified: Secondary | ICD-10-CM | POA: Diagnosis not present

## 2016-01-02 DIAGNOSIS — D509 Iron deficiency anemia, unspecified: Secondary | ICD-10-CM | POA: Diagnosis not present

## 2016-01-02 LAB — COMPREHENSIVE METABOLIC PANEL
ALBUMIN: 3.3 g/dL — AB (ref 3.5–5.0)
ALT: 13 U/L — ABNORMAL LOW (ref 17–63)
ANION GAP: 9 (ref 5–15)
AST: 18 U/L (ref 15–41)
Alkaline Phosphatase: 85 U/L (ref 38–126)
BILIRUBIN TOTAL: 1.8 mg/dL — AB (ref 0.3–1.2)
BUN: 29 mg/dL — AB (ref 6–20)
CHLORIDE: 97 mmol/L — AB (ref 101–111)
CO2: 24 mmol/L (ref 22–32)
Calcium: 8.3 mg/dL — ABNORMAL LOW (ref 8.9–10.3)
Creatinine, Ser: 1.11 mg/dL (ref 0.61–1.24)
GFR calc non Af Amer: 60 mL/min — ABNORMAL LOW (ref >60)
GLUCOSE: 123 mg/dL — AB (ref 65–99)
POTASSIUM: 4.4 mmol/L (ref 3.5–5.1)
SODIUM: 130 mmol/L — AB (ref 135–145)
Total Protein: 7.3 g/dL (ref 6.5–8.1)

## 2016-01-02 LAB — MAGNESIUM: MAGNESIUM: 1.6 mg/dL — AB (ref 1.7–2.4)

## 2016-01-02 MED ORDER — MAGNESIUM OXIDE 400 (241.3 MG) MG PO TABS
800.0000 mg | ORAL_TABLET | ORAL | Status: AC
Start: 2016-01-02 — End: 2016-01-02
  Administered 2016-01-02 (×2): 800 mg via ORAL
  Filled 2016-01-02 (×2): qty 2

## 2016-01-02 MED ORDER — FUROSEMIDE 20 MG PO TABS
20.0000 mg | ORAL_TABLET | Freq: Every day | ORAL | Status: DC
Start: 2016-01-02 — End: 2016-01-02
  Administered 2016-01-02: 20 mg via ORAL
  Filled 2016-01-02: qty 1

## 2016-01-02 MED ORDER — HYDROCODONE-ACETAMINOPHEN 5-325 MG PO TABS
1.0000 | ORAL_TABLET | Freq: Four times a day (QID) | ORAL | 0 refills | Status: DC | PRN
Start: 2016-01-02 — End: 2016-01-26

## 2016-01-02 MED ORDER — FAMOTIDINE 20 MG PO TABS
20.0000 mg | ORAL_TABLET | Freq: Two times a day (BID) | ORAL | Status: DC
  Administered 2016-01-02: 20 mg via ORAL
  Filled 2016-01-02: qty 1

## 2016-01-02 MED ORDER — MAGNESIUM OXIDE 400 (241.3 MG) MG PO TABS: 400.0000 mg | ORAL_TABLET | Freq: Every day | ORAL | Status: DC

## 2016-01-02 NOTE — Clinical Social Work Note (Signed)
Auth from [facility] received: [id number]. Patient's daughter has contacted CSW and stated that they are willing to pay out of pocket for the EMS transport as they know it will not be covered.  Shela Leff MSW,LCSW [phone]

## 2016-01-02 NOTE — Care Management Important Message (Signed)
Important Message  Patient Details  Name: NEALY WENBERG MRN: [id number] Date of Birth: October 29, 1930   Medicare Important Message Given:  Yes    Jolly Mango, RN 01/02/2016, 9:23 AM

## 2016-01-02 NOTE — Clinical Social Work Placement (Signed)
   CLINICAL SOCIAL WORK PLACEMENT  NOTE  Date:  01/02/2016  Patient Details  Name: Andres Gutierrez MRN: [id number] Date of Birth: May 20, 1930  Clinical Social Work is seeking post-discharge placement for this patient at the [place] level of care (*CSW will initial, date and re-position this form in  chart as items are completed):  Yes   Patient/family provided with [place] Work Department's list of facilities offering this level of care within the geographic area requested by the patient (or if unable, by the patient's family).  Yes   Patient/family informed of their freedom to choose among providers that offer the needed level of care, that participate in Medicare, Medicaid or managed care program needed by the patient, have an available bed and are willing to accept the patient.  Yes   Patient/family informed of [facility]'s ownership interest in [facility] and [facility], as well as of the fact that they are under no obligation to receive care at these facilities.  PASRR submitted to EDS on 12/30/15     PASRR number received on       Existing PASRR number confirmed on 12/30/15     FL2 transmitted to all facilities in geographic area requested by pt/family on 12/30/15     FL2 transmitted to all facilities within larger geographic area on       Patient informed that his/her managed care company has contracts with or will negotiate with certain facilities, including the following:        Yes   Patient/family informed of bed offers received.  Patient chooses bed at  [facility])     Physician recommends and patient chooses bed at  [facility])    Patient to be transferred to  [facility]) on 01/02/16.  Patient to be transferred to facility by  (EMS)     Patient family notified on 01/02/16 of transfer.  Name of family member notified:  daughter     PHYSICIAN Please sign FL2     Additional Comment:     _______________________________________________ Shela Leff, LCSW 01/02/2016, 2:51 PM

## 2016-01-02 NOTE — Progress Notes (Signed)
CONCERNING: IV to Oral Route Change Policy  RECOMMENDATION: This patient is receiving famotidine by the intravenous route.  Based on criteria approved by the Pharmacy and Therapeutics Committee, the intravenous medication(s) is/are being converted to the equivalent oral dose form(s).   DESCRIPTION: These criteria include:  The patient is eating (either orally or via tube) and/or has been taking other orally administered medications for a least 24 hours  The patient has no evidence of active gastrointestinal bleeding or impaired GI absorption (gastrectomy, short bowel, patient on TNA or NPO).  If you have questions about this conversion, please contact the Pharmacy Department  []   [phone] )  Andres Gutierrez [x]   [phone] )  [facility] []   [phone] )  [facility] []   [phone] )  [facility] []   [phone] )  [place], [facility] 01/02/2016 9:30 AM

## 2016-01-02 NOTE — Discharge Summary (Signed)
[facility] [place] NAME: Andres Gutierrez    MR#:  [id number]  DATE OF BIRTH:  March 28, 1931  DATE OF ADMISSION:  12/26/2015 ADMITTING PHYSICIAN: Bettey Costa, MD  DATE OF DISCHARGE: 01/02/2016  PRIMARY CARE PHYSICIAN: Rusty Aus, MD   ADMISSION DIAGNOSIS:  Nonspecific chest pain [R07.9]  DISCHARGE DIAGNOSIS:  Active Problems:   Elevated troponin   SECONDARY DIAGNOSIS:   Past Medical History:  Diagnosis Date  . Anemia   . Anxiety   . Atrial fibrillation ([place])   . Cardiomyopathy ([place])   . Chronic kidney disease    kidney stones  . Coronary artery disease   . Cough   . Dysrhythmia   . Hypertension   . Lymphadenopathy, hilar   . MI (myocardial infarction) ([place])    x 2  . Wheezing      ADMITTING HISTORY  Andres Gutierrez  is a 82 y.o. male with a known history of Chronic systolic heart failure EF of 25-30% and ASCVD who presents with above complaint. Patient was recently discharged after hospital stay of dyspnea. He was found to have flat troponins not indicative of ACS. He was evaluated by cardiology at that time. Echocardiogram at that time showed ejection fraction of 20-30% which is unchanged from previous echo cardiac exam. Patient reports that yesterday he had 2 episodes of diaphoresis on exertion which was relieved with rest. He also had left sided chest pain that was associated with the diaphoresis and dizziness. She had one more episode this morning of similar symptoms. He was brought to the ER for further evaluation. In the emergency room troponin was very minimally elevated at 0.04. EKG shows atrial fibrillation heart rate 110. Q waves in the septal leads.  HOSPITAL COURSE:   82 year old male with a history of chronic atrial fibrillation on anticoagulation, ASCVD and chronic systolic heart failure who presents with unstable angina.  1. CAD s/p CABG with Obstructed graft Status post cardiac catheter and stress test.  medical management recommended per cardiology  2. Chronic atrial fibrillation: With poor control with activity. ACE inhibitor and Coreg stopped Started on metoprolol. Continue digoxin Improved  3. Chronic systolic heart failure EF of 25-30% Due to borderline blood pressure and need to control his heart rate, discontinue his  lisinopril and Coreg. Placed on metoprolol. Can restart low dose ACEi if BP improved  4. Hyperlipidemia: Continue Lipitor.  5. ASCVD: Continue atorvastatin, aspirin  6. GERD Likely cause of his nausea/vomiting. Abd xray was normal. Resolved  Stable for discharge to rehab  CONSULTS OBTAINED:  Treatment Team:  Teodoro Spray, MD  DRUG ALLERGIES:   Allergies  Allergen Reactions  . Diphenhydramine Hcl Other (See Comments)    Reaction:  Rash and fever a long time ago, but has had it since with no problem.  . Escitalopram Other (See Comments)    Reaction:  Makes him feel faint, like he was going to have a heart attack.  . Maxidex [Dexamethasone] Other (See Comments)    Reaction:  Unknown   . Prednisone Other (See Comments)    Pt states that this medication made him feel crazy.    . Serotonin Other (See Comments)    Tried 2 different types, lexapro and another one.  Reaction:  Made him feel like he was having a heart attack.   . Vytorin [Ezetimibe-Simvastatin] Other (See Comments)    Reaction:  Unknown   . Zocor [Simvastatin] Other (See Comments)  Reaction:  Unknown     DISCHARGE MEDICATIONS:   Current Discharge Medication List    START taking these medications   Details  HYDROcodone-acetaminophen (NORCO/VICODIN) 5-325 MG tablet Take 1 tablet by mouth every 6 (six) hours as needed for moderate pain or severe pain. Qty: 15 tablet, Refills: 0    magnesium oxide (MAG-OX) 400 (241.3 Mg) MG tablet Take 1 tablet (400 mg total) by mouth daily.    metoprolol tartrate (LOPRESSOR) 25 MG tablet Take 1 tablet (25 mg total) by mouth 2 (two) times  daily. Qty: 60 tablet, Refills: 0    ranolazine (RANEXA) 500 MG 12 hr tablet Take 1 tablet (500 mg total) by mouth 2 (two) times daily. Qty: 60 tablet, Refills: 0      CONTINUE these medications which have CHANGED   Details  atorvastatin (LIPITOR) 40 MG tablet Take 1 tablet (40 mg total) by mouth daily at 6 PM. Qty: 30 tablet, Refills: 0      CONTINUE these medications which have NOT CHANGED   Details  apixaban (ELIQUIS) 5 MG TABS tablet Take 1 tablet (5 mg total) by mouth 2 (two) times daily. Qty: 60 tablet, Refills: 11    aspirin EC 81 MG tablet Take 81 mg by mouth daily.    digoxin (LANOXIN) 0.125 MG tablet Take 1 tablet (0.125 mg total) by mouth daily. Qty: 30 tablet, Refills: 0    ferrous sulfate 325 (65 FE) MG tablet Take 1 tablet (325 mg total) by mouth daily. Qty: 30 tablet, Refills: 3    furosemide (LASIX) 20 MG tablet Take 1 tablet (20 mg total) by mouth daily. Qty: 30 tablet, Refills: 0    nitroGLYCERIN (NITROSTAT) 0.4 MG SL tablet Place 0.4 mg under the tongue every 5 (five) minutes as needed for chest pain.    pantoprazole (PROTONIX) 40 MG tablet Take 40 mg by mouth 2 (two) times daily before a meal.     potassium chloride (K-DUR) 10 MEQ tablet Take 10 mEq by mouth 2 (two) times daily.    senna (SENOKOT) 8.6 MG tablet Take 1 tablet by mouth at bedtime as needed for constipation.    sucralfate (CARAFATE) 1 G tablet Take 1 g by mouth 2 (two) times daily.     tamsulosin (FLOMAX) 0.4 MG CAPS capsule Take 0.4 mg by mouth daily after supper.     vitamin C (VITAMIN C) 250 MG tablet Take 1 tablet (250 mg total) by mouth daily. Qty: 30 tablet, Refills: 0      STOP taking these medications     carvedilol (COREG) 3.125 MG tablet      lisinopril (PRINIVIL,ZESTRIL) 2.5 MG tablet      mupirocin ointment (BACTROBAN) 2 %         Today   VITAL SIGNS:  Blood pressure 109/63, pulse 86, temperature 97.6 F (36.4 C), temperature source Oral, resp. rate 18, height  6\' 4"  (1.93 m), weight 83.9 kg (184 lb 14.4 oz), SpO2 99 %.  I/O:   Intake/Output Summary (Last 24 hours) at 01/02/16 0938 Last data filed at 01/02/16 0801  Gross per 24 hour  Intake              100 ml  Output              350 ml  Net             -250 ml    PHYSICAL EXAMINATION:  Physical Exam  GENERAL:  82 y.o.-year-old patient lying in  the bed with no acute distress.  LUNGS: Normal breath sounds bilaterally, no wheezing, rales,rhonchi or crepitation. No use of accessory muscles of respiration.  CARDIOVASCULAR: S1, S2 normal. No murmurs, rubs, or gallops.  ABDOMEN: Soft, non-tender, non-distended. Bowel sounds present. No organomegaly or mass.  NEUROLOGIC: Moves all 4 extremities. PSYCHIATRIC: The patient is alert and oriented x 3.  SKIN: No obvious rash, lesion, or ulcer.   DATA REVIEW:   CBC  Recent Labs Lab 12/29/15 0550  WBC 6.4  HGB 12.4*  HCT 34.5*  PLT 231    Chemistries   Recent Labs Lab 01/02/16 0538  NA 130*  K 4.4  CL 97*  CO2 24  GLUCOSE 123*  BUN 29*  CREATININE 1.11  CALCIUM 8.3*  MG 1.6*  AST 18  ALT 13*  ALKPHOS 85  BILITOT 1.8*    Cardiac Enzymes  Recent Labs Lab 12/27/15 0120  TROPONINI 0.05*    Microbiology Results    RADIOLOGY:  Dg Abd 2 Views  Result Date: 01/01/2016 CLINICAL DATA:  Pt has been hospitalized since the 14th but today he started to experience vomiting and nausea but no abdominal pain. EXAM: ABDOMEN - 2 VIEW COMPARISON:  CT the abdomen and pelvis of 10/28/2015. FINDINGS: Upright and supine views. The upright view images only the upper abdomen. Minimal right-sided abdominal fluid levels are likely within the colon. No free intraperitoneal air. Prior median sternotomy. The supine view demonstrates gas within normal caliber colon. No small bowel distension. Low pelvis excluded. IMPRESSION: No specific evidence of bowel obstruction. Minimal fluid levels within the right-sided abdomen are favored to be within the  colon. Electronically Signed   By: Abigail Miyamoto M.D.   On: 01/01/2016 14:22    Follow up with PCP in 1 week.  Management plans discussed with the patient, family and they are in agreement.  CODE STATUS:     Code Status Orders        Start     Ordered   12/26/15 1317  Full code  Continuous     12/26/15 1317    Code Status History    Date Active Date Inactive Code Status Order ID Comments User Context   12/23/2015 10:46 PM 12/24/2015  8:35 AM Full Code [id number]  Lance Coon, MD Inpatient   11/03/2015  5:58 PM 11/05/2015  7:05 PM Full Code [id number]  Lytle Butte, MD ED   10/29/2015  1:08 AM 11/01/2015  4:05 PM Full Code [id number]  Alesia Richards, MD ED   12/22/2014  2:23 AM 12/24/2014  2:13 PM Full Code [id number]  Juluis Mire, MD Inpatient      TOTAL TIME TAKING CARE OF THIS PATIENT ON DAY OF DISCHARGE: more than 30 minutes.   Hillary Bow R M.D on 01/02/2016 at 9:38 AM  Between 7am to 6pm - Pager - [phone]  After 6pm go to [email or web address] - password [email or web address] [facility]  Office  [phone]  CC: Primary care physician; Rusty Aus, MD  Note: This dictation was prepared with Dragon dictation along with smaller phrase technology. Any transcriptional errors that result from this process are unintentional.

## 2016-01-02 NOTE — Clinical Social Work Note (Signed)
MD to discharge patient today to [facility]. Discharge information sent to [facility] [place] and he has received it. Patient is aware of discharge as is daughter and both are in agreement. Patient's daughter to transport patient today. Nurse to call report to [place]. CSW is awaiting authorization from patient's insurance: [employer]. Shela Leff MSW,LCSW [phone]

## 2016-01-03 DIAGNOSIS — I480 Paroxysmal atrial fibrillation: Secondary | ICD-10-CM | POA: Diagnosis not present

## 2016-01-03 DIAGNOSIS — I2510 Atherosclerotic heart disease of native coronary artery without angina pectoris: Secondary | ICD-10-CM | POA: Diagnosis not present

## 2016-01-03 DIAGNOSIS — I5022 Chronic systolic (congestive) heart failure: Secondary | ICD-10-CM | POA: Diagnosis not present

## 2016-01-03 DIAGNOSIS — E782 Mixed hyperlipidemia: Secondary | ICD-10-CM | POA: Diagnosis not present

## 2016-01-03 DIAGNOSIS — K219 Gastro-esophageal reflux disease without esophagitis: Secondary | ICD-10-CM | POA: Diagnosis not present

## 2016-01-03 DIAGNOSIS — N400 Enlarged prostate without lower urinary tract symptoms: Secondary | ICD-10-CM | POA: Diagnosis not present

## 2016-01-03 DIAGNOSIS — I509 Heart failure, unspecified: Secondary | ICD-10-CM | POA: Diagnosis not present

## 2016-01-03 DIAGNOSIS — I10 Essential (primary) hypertension: Secondary | ICD-10-CM | POA: Diagnosis not present

## 2016-01-03 DIAGNOSIS — D509 Iron deficiency anemia, unspecified: Secondary | ICD-10-CM | POA: Diagnosis not present

## 2016-01-17 ENCOUNTER — Ambulatory Visit: Payer: PPO | Attending: Family | Admitting: Family

## 2016-01-17 ENCOUNTER — Encounter: Payer: Self-pay | Admitting: Family

## 2016-01-17 VITALS — BP 143/61 | HR 89 | Resp 18 | Ht 76.0 in | Wt 188.0 lb

## 2016-01-17 DIAGNOSIS — Z888 Allergy status to other drugs, medicaments and biological substances status: Secondary | ICD-10-CM | POA: Diagnosis not present

## 2016-01-17 DIAGNOSIS — I252 Old myocardial infarction: Secondary | ICD-10-CM | POA: Insufficient documentation

## 2016-01-17 DIAGNOSIS — D649 Anemia, unspecified: Secondary | ICD-10-CM | POA: Insufficient documentation

## 2016-01-17 DIAGNOSIS — I2510 Atherosclerotic heart disease of native coronary artery without angina pectoris: Secondary | ICD-10-CM | POA: Diagnosis not present

## 2016-01-17 DIAGNOSIS — I482 Chronic atrial fibrillation, unspecified: Secondary | ICD-10-CM

## 2016-01-17 DIAGNOSIS — R600 Localized edema: Secondary | ICD-10-CM | POA: Insufficient documentation

## 2016-01-17 DIAGNOSIS — Z7982 Long term (current) use of aspirin: Secondary | ICD-10-CM | POA: Insufficient documentation

## 2016-01-17 DIAGNOSIS — I130 Hypertensive heart and chronic kidney disease with heart failure and stage 1 through stage 4 chronic kidney disease, or unspecified chronic kidney disease: Secondary | ICD-10-CM | POA: Insufficient documentation

## 2016-01-17 DIAGNOSIS — Z720 Tobacco use: Secondary | ICD-10-CM

## 2016-01-17 DIAGNOSIS — Z8249 Family history of ischemic heart disease and other diseases of the circulatory system: Secondary | ICD-10-CM | POA: Diagnosis not present

## 2016-01-17 DIAGNOSIS — I5022 Chronic systolic (congestive) heart failure: Secondary | ICD-10-CM

## 2016-01-17 DIAGNOSIS — Z955 Presence of coronary angioplasty implant and graft: Secondary | ICD-10-CM | POA: Diagnosis not present

## 2016-01-17 DIAGNOSIS — Z800 Family history of malignant neoplasm of digestive organs: Secondary | ICD-10-CM | POA: Insufficient documentation

## 2016-01-17 DIAGNOSIS — N189 Chronic kidney disease, unspecified: Secondary | ICD-10-CM | POA: Insufficient documentation

## 2016-01-17 DIAGNOSIS — I429 Cardiomyopathy, unspecified: Secondary | ICD-10-CM | POA: Insufficient documentation

## 2016-01-17 DIAGNOSIS — I10 Essential (primary) hypertension: Secondary | ICD-10-CM

## 2016-01-17 DIAGNOSIS — I952 Hypotension due to drugs: Secondary | ICD-10-CM | POA: Diagnosis not present

## 2016-01-17 DIAGNOSIS — R5383 Other fatigue: Secondary | ICD-10-CM | POA: Diagnosis not present

## 2016-01-17 NOTE — Progress Notes (Signed)
Subjective:    Patient ID: Andres Gutierrez, male    DOB: 1930-11-09, 82 y.o.   MRN: [id number]  Congestive Heart Failure  Presents for initial visit. The disease course has been stable. Associated symptoms include edema, fatigue, palpitations and shortness of breath ("at times"). Pertinent negatives include no abdominal pain, chest pressure or orthopnea. The symptoms have been stable. Past treatments include beta blockers, digoxin and salt and fluid restriction. The treatment provided moderate relief. Compliance with prior treatments has been good. His past medical history is significant for anemia, arrhythmia, CAD and HTN. There is no history of DM. He has one 1st degree relative with heart disease.  Hypertension  This is a chronic problem. The current episode started more than 1 year ago. The problem is unchanged. The problem is controlled. Associated symptoms include palpitations, peripheral edema and shortness of breath ("at times"). Pertinent negatives include no headaches or neck pain. There are no associated agents to hypertension. Risk factors for coronary artery disease include family history, male gender, sedentary lifestyle and smoking/tobacco exposure. Past treatments include beta blockers, diuretics and lifestyle changes. The current treatment provides moderate improvement. Compliance problems include exercise.  Hypertensive end-organ damage includes kidney disease, CAD/MI and heart failure.   Past Medical History:  Diagnosis Date  . Anemia   . Anxiety   . Atrial fibrillation ([place])   . Cardiomyopathy ([place])   . Chronic kidney disease    kidney stones  . Coronary artery disease   . Cough   . Dysrhythmia   . Hypertension   . Lymphadenopathy, hilar   . MI (myocardial infarction) ([place])    x 2  . Wheezing     Past Surgical History:  Procedure Laterality Date  . BRONCHIAL NEEDLE ASPIRATION BIOPSY N/A 12/31/2014   Procedure: BRONCHIAL NEEDLE ASPIRATION BIOPSIES from carina;  Surgeon:  Flora Lipps, MD;  Location: [facility] ORS;  Service: Cardiopulmonary;  Laterality: N/A;  . CARDIAC CATHETERIZATION N/A 12/28/2015   Procedure: Left Heart Cath and Cors/Grafts Angiography;  Surgeon: Yolonda Kida, MD;  Location: [facility] CV LAB;  Service: Cardiovascular;  Laterality: N/A;  . CORONARY ANGIOPLASTY WITH STENT PLACEMENT    . CORONARY ARTERY BYPASS GRAFT    . ENDOBRONCHIAL ULTRASOUND N/A 12/31/2014   Procedure: ENDOBRONCHIAL ULTRASOUND;  Surgeon: Flora Lipps, MD;  Location: [facility] ORS;  Service: Cardiopulmonary;  Laterality: N/A;  . ESOPHAGOGASTRODUODENOSCOPY (EGD) WITH PROPOFOL N/A 06/20/2015   Procedure: ESOPHAGOGASTRODUODENOSCOPY (EGD) WITH PROPOFOL;  Surgeon: Lollie Sails, MD;  Location: [facility] ENDOSCOPY;  Service: Endoscopy;  Laterality: N/A;    Family History  Problem Relation Age of Onset  . CAD Mother   . Colon cancer Father     Social History  Substance Use Topics  . Smoking status: Former Smoker    Quit date: 06/27/1977  . Smokeless tobacco: Current User    Types: Chew  . Alcohol use Yes     Comment: occational almost rare once a year    Allergies  Allergen Reactions  . Diphenhydramine Hcl Other (See Comments)    Reaction:  Rash and fever a long time ago, but has had it since with no problem.  . Escitalopram Other (See Comments)    Reaction:  Makes him feel faint, like he was going to have a heart attack.  . Maxidex [Dexamethasone] Other (See Comments)    Reaction:  Unknown   . Prednisone Other (See Comments)    Pt states that this medication made him feel crazy.    Marland Kitchen  Serotonin Other (See Comments)    Tried 2 different types, lexapro and another one.  Reaction:  Made him feel like he was having a heart attack.   . Vytorin [Ezetimibe-Simvastatin] Other (See Comments)    Reaction:  Unknown   . Zocor [Simvastatin] Other (See Comments)    Reaction:  Unknown     Prior to Admission medications   Medication Sig Start Date End Date Taking? Authorizing  Provider  apixaban (ELIQUIS) 5 MG TABS tablet Take 1 tablet (5 mg total) by mouth 2 (two) times daily. 12/23/14  Yes Rusty Aus, MD  aspirin EC 81 MG tablet Take 81 mg by mouth daily.   Yes Historical Provider, MD  atorvastatin (LIPITOR) 40 MG tablet Take 1 tablet (40 mg total) by mouth daily at 6 PM. 12/30/15  Yes Dustin Flock, MD  digoxin (LANOXIN) 0.125 MG tablet Take 1 tablet (0.125 mg total) by mouth daily. 11/01/15  Yes Dustin Flock, MD  ferrous sulfate 325 (65 FE) MG tablet Take 1 tablet (325 mg total) by mouth daily. 11/01/15  Yes Dustin Flock, MD  furosemide (LASIX) 20 MG tablet Take 1 tablet (20 mg total) by mouth daily. 12/24/15  Yes Bettey Costa, MD  HYDROcodone-acetaminophen (NORCO/VICODIN) 5-325 MG tablet Take 1 tablet by mouth every 6 (six) hours as needed for moderate pain or severe pain. 01/02/16  Yes Srikar Sudini, MD  magnesium oxide (MAG-OX) 400 (241.3 Mg) MG tablet Take 1 tablet (400 mg total) by mouth daily. 01/03/16  Yes Srikar Sudini, MD  metoprolol tartrate (LOPRESSOR) 25 MG tablet Take 1 tablet (25 mg total) by mouth 2 (two) times daily. 12/31/15  Yes Srikar Sudini, MD  nitroGLYCERIN (NITROSTAT) 0.4 MG SL tablet Place 0.4 mg under the tongue every 5 (five) minutes as needed for chest pain.   Yes Historical Provider, MD  pantoprazole (PROTONIX) 40 MG tablet Take 40 mg by mouth 2 (two) times daily before a meal.    Yes Historical Provider, MD  potassium chloride (K-DUR) 10 MEQ tablet Take 10 mEq by mouth 2 (two) times daily.   Yes Historical Provider, MD  ranolazine (RANEXA) 500 MG 12 hr tablet Take 1 tablet (500 mg total) by mouth 2 (two) times daily. 12/30/15  Yes Dustin Flock, MD  senna (SENOKOT) 8.6 MG tablet Take 1 tablet by mouth at bedtime as needed for constipation.   Yes Historical Provider, MD  sucralfate (CARAFATE) 1 G tablet Take 1 g by mouth 2 (two) times daily.    Yes Historical Provider, MD  tamsulosin (FLOMAX) 0.4 MG CAPS capsule Take 0.4 mg by mouth daily  after supper.    Yes Historical Provider, MD  vitamin C (VITAMIN C) 250 MG tablet Take 1 tablet (250 mg total) by mouth daily. 11/01/15  Yes Dustin Flock, MD      Review of Systems  Constitutional: Positive for appetite change ("not hungry") and fatigue.  HENT: Negative for congestion, postnasal drip and sore throat.   Eyes: Negative.   Respiratory: Positive for chest tightness ("at times") and shortness of breath ("at times"). Negative for cough.   Cardiovascular: Positive for palpitations and leg swelling.  Gastrointestinal: Negative for abdominal distention and abdominal pain.  Endocrine: Negative.   Genitourinary: Negative.   Musculoskeletal: Positive for arthralgias (left hip at times). Negative for neck pain.  Skin: Positive for wound (left forearm bandaged). Negative for rash.  Allergic/Immunologic: Negative.   Neurological: Positive for dizziness. Negative for weakness and headaches.  Hematological: Negative for adenopathy. Bruises/bleeds easily.  Psychiatric/Behavioral: Negative for dysphoric mood and sleep disturbance (sleeping on 1 pillow). The patient is not nervous/anxious.        Objective:   Physical Exam  Constitutional: He is oriented to person, place, and time. He appears well-developed and well-nourished.  HENT:  Head: Normocephalic and atraumatic.  Eyes: Conjunctivae are normal. Pupils are equal, round, and reactive to light.  Neck: Normal range of motion. Neck supple.  Cardiovascular: Normal rate.  An irregular rhythm present.  Pulmonary/Chest: Effort normal. He has no wheezes. He has no rales.  Abdominal: Soft. He exhibits no distension. There is no tenderness.  Musculoskeletal: He exhibits edema (1+ pitting edema in R lower leg, trace amount L lower leg). He exhibits no tenderness.  Neurological: He is alert and oriented to person, place, and time.  Skin: Skin is warm and dry.  Psychiatric: He has a normal mood and affect. His behavior is normal. Thought  content normal.  Nursing note and vitals reviewed.   BP (!) 143/61   Pulse 89   Resp 18   Ht 6\' 4"  (1.93 m)   Wt 188 lb (85.3 kg)   SpO2 100%   BMI 22.88 kg/m        Assessment & Plan:  1: Chronic heart failure with reduced ejection fraction- Patient presents with fatigue and shortness of breath with minimal exertion at times (Class III). Symptoms improve quickly upon rest. He came into the office in a wheelchair because he says that he wouldn't have been able to walk this far. Is getting physical therapy twice daily at [facility] although he says that he does get quite dizzy at times with therapy. He is not getting weighed daily and an order was written for him to get weighed daily and to notify the provider for an overnight weight gain of >2 pounds or a weekly weight gain of >5 pounds. He is not adding salt to his food and says that he's being served a low sodium diet. Written dietary information was given to him about a 2000mg  sodium diet. Has an appointment with cardiologist Ubaldo Glassing) on 03/13/16. [facility] PharmD went in and reviewed medications with the patient.  2: HTN- Blood pressure looks good today. ACE-I was stopped in the hospital due to a low blood pressure so could consider starting entresto at future visits. 3: Atrial fibrillation- Currently rate controlled at this time. Is taking digoxin, metoprolol tartrate along with apixaban. Multiple bruises noted on bilateral lower arms. 4: Chewing tobacco- Patient says that he continues to chew tobacco and doesn't have a desire to quit at this time. Complete cessation discussed for 3 minutes with him.   Medication list was reviewed.   Return here in 1 month or sooner for any questions/problems before then.

## 2016-01-17 NOTE — Patient Instructions (Signed)
Begin weighing daily and call for an overnight weight gain of > 2 pounds or a weekly weight gain of >5 pounds. 

## 2016-01-19 ENCOUNTER — Telehealth: Payer: Self-pay | Admitting: Family

## 2016-01-19 DIAGNOSIS — I10 Essential (primary) hypertension: Secondary | ICD-10-CM | POA: Diagnosis not present

## 2016-01-19 DIAGNOSIS — I2510 Atherosclerotic heart disease of native coronary artery without angina pectoris: Secondary | ICD-10-CM | POA: Diagnosis not present

## 2016-01-19 DIAGNOSIS — I951 Orthostatic hypotension: Secondary | ICD-10-CM | POA: Diagnosis not present

## 2016-01-19 DIAGNOSIS — I5022 Chronic systolic (congestive) heart failure: Secondary | ICD-10-CM | POA: Diagnosis not present

## 2016-01-19 MED ORDER — FUROSEMIDE 20 MG PO TABS: 20.0000 mg | ORAL_TABLET | Freq: Every day | ORAL | 0 refills | Status: DC | PRN

## 2016-01-19 NOTE — Telephone Encounter (Signed)
Patient is currently a resident at [facility] and the provider there, Dr. Clemmie Krill, called to say that patient's blood pressure was dropping quite a bit while doing physical therapy and he was experiencing dizziness which was limiting his ability to do physical therapy. She says that he currently doesn't have any swelling in his legs. Discussed with her giving the furosemide on a PRN basis based on daily weights and any swelling. She agrees and will change the order.

## 2016-01-24 ENCOUNTER — Encounter: Payer: Self-pay | Admitting: Emergency Medicine

## 2016-01-24 ENCOUNTER — Encounter (HOSPITAL_COMMUNITY): Payer: Self-pay | Admitting: Gastroenterology

## 2016-01-24 ENCOUNTER — Observation Stay (HOSPITAL_COMMUNITY)
Admission: AD | Admit: 2016-01-24 | Discharge: 2016-01-26 | Disposition: A | Discharge status: Skilled Nursing Facility | Payer: PPO | Source: Other Acute Inpatient Hospital | Attending: Internal Medicine | Admitting: Internal Medicine

## 2016-01-24 ENCOUNTER — Emergency Department
Admission: EM | Admit: 2016-01-24 | Discharge: 2016-01-24 | Disposition: A | Discharge status: Other Acute Inpatient Hospital | Payer: PPO | Attending: Emergency Medicine | Admitting: Emergency Medicine

## 2016-01-24 ENCOUNTER — Emergency Department: Payer: PPO

## 2016-01-24 DIAGNOSIS — K921 Melena: Secondary | ICD-10-CM

## 2016-01-24 DIAGNOSIS — R778 Other specified abnormalities of plasma proteins: Secondary | ICD-10-CM | POA: Insufficient documentation

## 2016-01-24 DIAGNOSIS — N189 Chronic kidney disease, unspecified: Secondary | ICD-10-CM | POA: Insufficient documentation

## 2016-01-24 DIAGNOSIS — D62 Acute posthemorrhagic anemia: Principal | ICD-10-CM | POA: Insufficient documentation

## 2016-01-24 DIAGNOSIS — I509 Heart failure, unspecified: Secondary | ICD-10-CM | POA: Diagnosis not present

## 2016-01-24 DIAGNOSIS — I2510 Atherosclerotic heart disease of native coronary artery without angina pectoris: Secondary | ICD-10-CM | POA: Diagnosis not present

## 2016-01-24 DIAGNOSIS — Z79899 Other long term (current) drug therapy: Secondary | ICD-10-CM | POA: Diagnosis not present

## 2016-01-24 DIAGNOSIS — R7989 Other specified abnormal findings of blood chemistry: Secondary | ICD-10-CM

## 2016-01-24 DIAGNOSIS — R195 Other fecal abnormalities: Secondary | ICD-10-CM | POA: Diagnosis not present

## 2016-01-24 DIAGNOSIS — I482 Chronic atrial fibrillation, unspecified: Secondary | ICD-10-CM | POA: Diagnosis present

## 2016-01-24 DIAGNOSIS — I252 Old myocardial infarction: Secondary | ICD-10-CM | POA: Insufficient documentation

## 2016-01-24 DIAGNOSIS — Z7982 Long term (current) use of aspirin: Secondary | ICD-10-CM | POA: Insufficient documentation

## 2016-01-24 DIAGNOSIS — I951 Orthostatic hypotension: Secondary | ICD-10-CM | POA: Diagnosis not present

## 2016-01-24 DIAGNOSIS — F17210 Nicotine dependence, cigarettes, uncomplicated: Secondary | ICD-10-CM | POA: Insufficient documentation

## 2016-01-24 DIAGNOSIS — I255 Ischemic cardiomyopathy: Secondary | ICD-10-CM | POA: Diagnosis not present

## 2016-01-24 DIAGNOSIS — Z955 Presence of coronary angioplasty implant and graft: Secondary | ICD-10-CM | POA: Diagnosis not present

## 2016-01-24 DIAGNOSIS — I248 Other forms of acute ischemic heart disease: Secondary | ICD-10-CM | POA: Diagnosis not present

## 2016-01-24 DIAGNOSIS — F17220 Nicotine dependence, chewing tobacco, uncomplicated: Secondary | ICD-10-CM | POA: Diagnosis not present

## 2016-01-24 DIAGNOSIS — K922 Gastrointestinal hemorrhage, unspecified: Secondary | ICD-10-CM

## 2016-01-24 DIAGNOSIS — Z951 Presence of aortocoronary bypass graft: Secondary | ICD-10-CM | POA: Diagnosis not present

## 2016-01-24 DIAGNOSIS — N390 Urinary tract infection, site not specified: Secondary | ICD-10-CM | POA: Diagnosis not present

## 2016-01-24 DIAGNOSIS — I130 Hypertensive heart and chronic kidney disease with heart failure and stage 1 through stage 4 chronic kidney disease, or unspecified chronic kidney disease: Secondary | ICD-10-CM | POA: Insufficient documentation

## 2016-01-24 DIAGNOSIS — R079 Chest pain, unspecified: Secondary | ICD-10-CM | POA: Diagnosis not present

## 2016-01-24 DIAGNOSIS — D500 Iron deficiency anemia secondary to blood loss (chronic): Secondary | ICD-10-CM | POA: Diagnosis not present

## 2016-01-24 DIAGNOSIS — K5900 Constipation, unspecified: Secondary | ICD-10-CM | POA: Diagnosis not present

## 2016-01-24 DIAGNOSIS — D649 Anemia, unspecified: Secondary | ICD-10-CM | POA: Diagnosis not present

## 2016-01-24 DIAGNOSIS — R0789 Other chest pain: Secondary | ICD-10-CM | POA: Diagnosis not present

## 2016-01-24 DIAGNOSIS — Z7901 Long term (current) use of anticoagulants: Secondary | ICD-10-CM | POA: Diagnosis not present

## 2016-01-24 DIAGNOSIS — I5022 Chronic systolic (congestive) heart failure: Secondary | ICD-10-CM | POA: Diagnosis present

## 2016-01-24 HISTORY — DX: Heart failure, unspecified: I50.9

## 2016-01-24 LAB — BASIC METABOLIC PANEL
ANION GAP: 7 (ref 5–15)
BUN: 16 mg/dL (ref 6–20)
CALCIUM: 8.8 mg/dL — AB (ref 8.9–10.3)
CO2: 25 mmol/L (ref 22–32)
CREATININE: 1.27 mg/dL — AB (ref 0.61–1.24)
Chloride: 101 mmol/L (ref 101–111)
GFR calc Af Amer: 59 mL/min — ABNORMAL LOW (ref >60)
GFR, EST NON AFRICAN AMERICAN: 51 mL/min — AB (ref >60)
GLUCOSE: 113 mg/dL — AB (ref 65–99)
Potassium: 4.2 mmol/L (ref 3.5–5.1)
Sodium: 133 mmol/L — ABNORMAL LOW (ref 135–145)

## 2016-01-24 LAB — PROTIME-INR
INR: 1.65
PROTHROMBIN TIME: 19.7 s — AB (ref 11.4–15.2)

## 2016-01-24 LAB — ABO/RH: ABO/RH(D): A POS

## 2016-01-24 LAB — CBC
HCT: 27 % — ABNORMAL LOW (ref 40.0–52.0)
HEMOGLOBIN: 9.4 g/dL — AB (ref 13.0–18.0)
MCH: 33.9 pg (ref 26.0–34.0)
MCHC: 34.7 g/dL (ref 32.0–36.0)
MCV: 97.6 fL (ref 80.0–100.0)
PLATELETS: 330 10*3/uL (ref 150–440)
RBC: 2.77 MIL/uL — ABNORMAL LOW (ref 4.40–5.90)
RDW: 19.8 % — AB (ref 11.5–14.5)
WBC: 9 10*3/uL (ref 3.8–10.6)

## 2016-01-24 LAB — TROPONIN I
TROPONIN I: 0.04 ng/mL — AB (ref <0.03)
TROPONIN I: 0.04 ng/mL — AB (ref <0.03)

## 2016-01-24 MED ORDER — ATORVASTATIN CALCIUM 40 MG PO TABS
40.0000 mg | ORAL_TABLET | Freq: Every day | ORAL | Status: DC
  Administered 2016-01-24 – 2016-01-26 (×3): 40 mg via ORAL
  Filled 2016-01-24 (×3): qty 1

## 2016-01-24 MED ORDER — NITROGLYCERIN 0.4 MG SL SUBL: 0.4000 mg | SUBLINGUAL_TABLET | SUBLINGUAL | Status: DC | PRN

## 2016-01-24 MED ORDER — HYDROCODONE-ACETAMINOPHEN 5-325 MG PO TABS: 1.0000 | ORAL_TABLET | Freq: Four times a day (QID) | ORAL | Status: DC | PRN

## 2016-01-24 MED ORDER — METOPROLOL TARTRATE 12.5 MG HALF TABLET
12.5000 mg | ORAL_TABLET | Freq: Two times a day (BID) | ORAL | Status: DC
  Administered 2016-01-24 – 2016-01-26 (×4): 12.5 mg via ORAL
  Filled 2016-01-24 (×4): qty 1

## 2016-01-24 MED ORDER — SODIUM CHLORIDE 0.9 % IV SOLN: 10.0000 mL/h | Freq: Once | INTRAVENOUS | Status: DC

## 2016-01-24 MED ORDER — VITAMIN C 500 MG PO TABS
250.0000 mg | ORAL_TABLET | Freq: Every day | ORAL | Status: DC
  Administered 2016-01-25 – 2016-01-26 (×2): 250 mg via ORAL
  Filled 2016-01-24 (×2): qty 1

## 2016-01-24 MED ORDER — TAMSULOSIN HCL 0.4 MG PO CAPS
0.4000 mg | ORAL_CAPSULE | Freq: Every day | ORAL | Status: DC
  Administered 2016-01-25 – 2016-01-26 (×2): 0.4 mg via ORAL
  Filled 2016-01-24 (×2): qty 1

## 2016-01-24 MED ORDER — DIGOXIN 125 MCG PO TABS
0.1250 mg | ORAL_TABLET | Freq: Every day | ORAL | Status: DC
  Administered 2016-01-25: 0.125 mg via ORAL
  Filled 2016-01-24: qty 1

## 2016-01-24 MED ORDER — RANOLAZINE ER 500 MG PO TB12
500.0000 mg | ORAL_TABLET | Freq: Two times a day (BID) | ORAL | Status: DC
  Administered 2016-01-24 – 2016-01-26 (×4): 500 mg via ORAL
  Filled 2016-01-24 (×5): qty 1

## 2016-01-24 MED ORDER — PANTOPRAZOLE SODIUM 40 MG IV SOLR
40.0000 mg | Freq: Two times a day (BID) | INTRAVENOUS | Status: DC
  Administered 2016-01-24 – 2016-01-26 (×4): 40 mg via INTRAVENOUS
  Filled 2016-01-24 (×4): qty 40

## 2016-01-24 MED ORDER — ACETAMINOPHEN 325 MG PO TABS: 650.0000 mg | ORAL_TABLET | Freq: Four times a day (QID) | ORAL | Status: DC | PRN

## 2016-01-24 MED ORDER — ONDANSETRON HCL 4 MG/2ML IJ SOLN: 4.0000 mg | Freq: Four times a day (QID) | INTRAMUSCULAR | Status: DC | PRN

## 2016-01-24 MED ORDER — ONDANSETRON HCL 4 MG PO TABS: 4.0000 mg | ORAL_TABLET | Freq: Four times a day (QID) | ORAL | Status: DC | PRN

## 2016-01-24 MED ORDER — MAGNESIUM OXIDE 400 (241.3 MG) MG PO TABS: 400.0000 mg | ORAL_TABLET | Freq: Every day | ORAL | Status: DC

## 2016-01-24 MED ORDER — INFLUENZA VAC SPLIT QUAD 0.5 ML IM SUSY
0.5000 mL | PREFILLED_SYRINGE | INTRAMUSCULAR | Status: AC
  Administered 2016-01-25: 0.5 mL via INTRAMUSCULAR

## 2016-01-24 NOTE — H&P (Signed)
[facility] History and Physical  Andres Gutierrez:[id number] DOB: 14-Mar-1931 DOA: 01/24/2016  Referring physician: EDP at [facility] PCP: Rusty Aus, MD   Chief Complaint: GI Bleeding and No GI coverage over weekend  HPI: Andres Gutierrez is a 82 y.o. male with PMH of CAD, s/p CABG, ischemic cardiomyopathy EF 25%, P Afib on Eliquis was recently admitted to [facility] with chest pain from 8/14 through 8/21, at that time he had extensive cardiac workup with a stress test and Cardiac cath: Cardiac cath noted patent grafts and 75% lesion at insertion of ramus and medical management was recommended and sent to Rehab on 8/21, where is currently, pt reports intermittent episodes of L sided chest pain in axillary line on Sunday and earlier this am. In addition also reported increasing weakness, and has noted darker stools over the last few weeks which he attributed to Iron. He was seen in [place] [facility] today and noted to have Hb of 9.4 which is a 3gm drop from baseline of 12-13. Rectal exam noted Heme positive black stool, EDP d/w GI at [facility] and Transferred pt here due to lack of GI coverage at [facility]. Pt received 1 unit PRBC transfusion in ER prior to transfer  Review of Systems: per HPI, all other systems reviewed and are negative   Past Medical History:  Diagnosis Date  . Anemia   . Anxiety   . Atrial fibrillation ([place])   . Cardiomyopathy ([place])   . CHF (congestive heart failure) ([place])   . Chronic kidney disease    kidney stones  . Coronary artery disease   . Cough   . Dysrhythmia   . Hypertension   . Lymphadenopathy, hilar   . MI (myocardial infarction) ([place])    x 2  . Wheezing    Past Surgical History:  Procedure Laterality Date  . BRONCHIAL NEEDLE ASPIRATION BIOPSY N/A 12/31/2014   Procedure: BRONCHIAL NEEDLE ASPIRATION BIOPSIES from carina;  Surgeon: Flora Lipps, MD;  Location: [facility] ORS;  Service: Cardiopulmonary;  Laterality: N/A;  . CARDIAC  CATHETERIZATION N/A 12/28/2015   Procedure: Left Heart Cath and Cors/Grafts Angiography;  Surgeon: Yolonda Kida, MD;  Location: [place] CV LAB;  Service: Cardiovascular;  Laterality: N/A;  . CORONARY ANGIOPLASTY WITH STENT PLACEMENT    . CORONARY ARTERY BYPASS GRAFT    . ENDOBRONCHIAL ULTRASOUND N/A 12/31/2014   Procedure: ENDOBRONCHIAL ULTRASOUND;  Surgeon: Flora Lipps, MD;  Location: [facility] ORS;  Service: Cardiopulmonary;  Laterality: N/A;  . ESOPHAGOGASTRODUODENOSCOPY (EGD) WITH PROPOFOL N/A 06/20/2015   Procedure: ESOPHAGOGASTRODUODENOSCOPY (EGD) WITH PROPOFOL;  Surgeon: Lollie Sails, MD;  Location: [facility] ENDOSCOPY;  Service: Endoscopy;  Laterality: N/A;   Social History:  reports that he quit smoking about 38 years ago. His smokeless tobacco use includes Chew. He reports that he drinks alcohol. He reports that he does not use drugs.  Allergies  Allergen Reactions  . 5ht3 Receptor Antagonists   . Diphenhydramine Hcl Other (See Comments)    Reaction:  Rash and fever a long time ago, but has had it since with no problem.  . Escitalopram Other (See Comments)    Reaction:  Makes him feel faint, like he was going to have a heart attack.  . Maxidex [Dexamethasone] Other (See Comments)    Reaction:  Unknown   . Prednisone Other (See Comments)    Pt states that this medication made him feel crazy.    . Serotonin Other (See Comments)    Tried 2  different types, lexapro and another one.  Reaction:  Made him feel like he was having a heart attack.   . Vytorin [Ezetimibe-Simvastatin] Other (See Comments)    Reaction:  Unknown   . Zocor [Simvastatin] Other (See Comments)    Reaction:  Unknown     Family History  Problem Relation Age of Onset  . CAD Mother   . Colon cancer Father     Prior to Admission medications   Medication Sig Start Date End Date Taking? Authorizing Provider  acetaminophen (TYLENOL) 325 MG tablet Take 650 mg by mouth every 6 (six) hours as needed.     Historical Provider, MD  apixaban (ELIQUIS) 5 MG TABS tablet Take 1 tablet (5 mg total) by mouth 2 (two) times daily. 12/23/14   Rusty Aus, MD  aspirin EC 81 MG tablet Take 81 mg by mouth daily.    Historical Provider, MD  atorvastatin (LIPITOR) 40 MG tablet Take 1 tablet (40 mg total) by mouth daily at 6 PM. 12/30/15   Dustin Flock, MD  digoxin (LANOXIN) 0.125 MG tablet Take 1 tablet (0.125 mg total) by mouth daily. 11/01/15   Dustin Flock, MD  ferrous sulfate 325 (65 FE) MG tablet Take 1 tablet (325 mg total) by mouth daily. 11/01/15   Dustin Flock, MD  furosemide (LASIX) 20 MG tablet Take 20 mg by mouth as needed. For weight gain greater than 8 pounds in 24 hours or greater than 5 pounds in one week    Historical Provider, MD  HYDROcodone-acetaminophen (NORCO/VICODIN) 5-325 MG tablet Take 1 tablet by mouth every 6 (six) hours as needed for moderate pain or severe pain. 01/02/16   Srikar Sudini, MD  magnesium oxide (MAG-OX) 400 (241.3 Mg) MG tablet Take 1 tablet (400 mg total) by mouth daily. 01/03/16   Hillary Bow, MD  metoprolol tartrate (LOPRESSOR) 12.5 mg TABS tablet Take 12.5 mg by mouth 2 (two) times daily.    Historical Provider, MD  metoprolol tartrate (LOPRESSOR) 25 MG tablet Take 1 tablet (25 mg total) by mouth 2 (two) times daily. 12/31/15   Srikar Sudini, MD  nitroGLYCERIN (NITROSTAT) 0.4 MG SL tablet Place 0.4 mg under the tongue every 5 (five) minutes as needed for chest pain.    Historical Provider, MD  pantoprazole (PROTONIX) 40 MG tablet Take 40 mg by mouth 2 (two) times daily before a meal.     Historical Provider, MD  potassium chloride (K-DUR) 10 MEQ tablet Take 10 mEq by mouth 2 (two) times daily.    Historical Provider, MD  ranolazine (RANEXA) 500 MG 12 hr tablet Take 1 tablet (500 mg total) by mouth 2 (two) times daily. 12/30/15   Dustin Flock, MD  senna (SENOKOT) 8.6 MG tablet Take 1 tablet by mouth at bedtime as needed for constipation.    Historical Provider, MD    sucralfate (CARAFATE) 1 G tablet Take 1 g by mouth 2 (two) times daily.     Historical Provider, MD  tamsulosin (FLOMAX) 0.4 MG CAPS capsule Take 0.4 mg by mouth daily after supper.     Historical Provider, MD  vitamin C (VITAMIN C) 250 MG tablet Take 1 tablet (250 mg total) by mouth daily. 11/01/15   Dustin Flock, MD   Physical Exam: Vitals:   01/24/16 1611  Weight: 85.1 kg (187 lb 9.8 oz)  Height: 6\' 4"  (1.93 m)    Wt Readings from Last 3 Encounters:  01/24/16 85.1 kg (187 lb 9.8 oz)  01/24/16 83.9 kg (185  lb)  01/17/16 85.3 kg (188 lb)    General:  Appears calm and comfortable, AAOx3, no distress Eyes: PERRL, normal lids, irises & conjunctiva ENT: grossly normal hearing, lips & tongue Neck: no LAD, masses or thyromegaly Cardiovascular: RRR, no m/r/g. No LE edema. Telemetry: SR, no arrhythmias  Respiratory: CTA bilaterally, no w/r/r. Normal respiratory effort. Abdomen: soft, ntnd Skin: bruises noted on skin Musculoskeletal: grossly normal tone BUE/BLE Psychiatric: grossly normal mood and affect, speech fluent and appropriate Neurologic: grossly non-focal.          Labs on Admission:  Basic Metabolic Panel:  Recent Labs Lab 01/24/16 1033  NA 133*  K 4.2  CL 101  CO2 25  GLUCOSE 113*  BUN 16  CREATININE 1.27*  CALCIUM 8.8*   Liver Function Tests: No results for input(s): AST, ALT, ALKPHOS, BILITOT, PROT, ALBUMIN in the last 168 hours. No results for input(s): LIPASE, AMYLASE in the last 168 hours. No results for input(s): AMMONIA in the last 168 hours. CBC:  Recent Labs Lab 01/24/16 1033  WBC 9.0  HGB 9.4*  HCT 27.0*  MCV 97.6  PLT 330   Cardiac Enzymes:  Recent Labs Lab 01/24/16 1033  TROPONINI 0.04*    BNP (last 3 results)  Recent Labs  12/23/15 1921  BNP 301.0*    ProBNP (last 3 results) No results for input(s): PROBNP in the last 8760 hours.  CBG: No results for input(s): GLUCAP in the last 168 hours.  Radiological Exams on  Admission: Dg Chest 2 View  Result Date: 01/24/2016 CLINICAL DATA:  Chest pain off and on for 2 days, diagnosed with CHF 1 month ago, coronary artery disease post MI, coronary PTCA and CABG, hypertension, atrial fibrillation, former smoker EXAM: CHEST  2 VIEW COMPARISON:  12/26/2015 radiograph, CT chest 10/28/2015 FINDINGS: Enlargement of cardiac silhouette post CABG. Mediastinal contours and pulmonary vascularity normal. Emphysematous and mild bronchitic changes question COPD. Asymmetric density at LEFT apex versus RIGHT at a site where a cavitary lesion was seen on the prior CT. No acute infiltrate, pleural effusion, or pneumothorax. Bones unremarkable. IMPRESSION: Enlargement of cardiac silhouette post CABG. COPD changes with asymmetric density at LEFT apex versus RIGHT, with the cavitary lesion identified on the prior CT exam not well visualized on current radiograph; followup CT chest recommended to exclude developing RIGHT apex nodule. Electronically Signed   By: Lavonia Dana M.D.   On: 01/24/2016 11:25    EKG: pending, I have   Assessment/Plan  1. Anemia with heme positive stools -hold eliquis and Iron -h/o EGD 2/17 with grade A esophagitis and gastritis -start IV protonix 40mg  q12 -[facility] consulted -s/p 1 unit PRBC, check CBC in am  2. Recurrent chest pain -s/p CABG, s/p Cath( 12/28/15) with patent grafts and 75% lesion to insertion of ramus, he also had a stress test then and Medical management recommended by his Cardiologists at [facility], followed by Dr.Kenneth Fath -troponin 0.04, will trend this -continue Metoprolol and statin, hold ASA due to #1 -continue Ranexa, no chest pain at this time  3.  Chronic atrial fibrillation (HCC) -HR controlled, in NSR now -continue metoprolol and hold eliquis -continue digoxin, check level  4. Ischemic cardiomyopathy/EF 25% -compensated, hold lasix today -resume in 1-2days  5. HTN -per Pt, his BP running lower recently, hold lasix, BP  stable now -ACE stopped recently  Code Status: Undecided, FUll Code for now DVT Prophylaxis:SCDs Family Communication: daughter at bedside Disposition Plan: home in 2days  Time spent: 89min  [facility] Pager [phone]

## 2016-01-24 NOTE — Progress Notes (Signed)
Notifief K.Schoor of pt troponin .04. No new orders at this time.

## 2016-01-24 NOTE — ED Triage Notes (Signed)
Pt c/o sharp left intermittent CP since Sunday. Hx MI/bypass. denies any other sx. Worst in AM when sitting on side of bed.

## 2016-01-24 NOTE — Progress Notes (Signed)
   Patient coming from [facility] for treatment of a GI lead on L [place]. No history of GI bleed in the past. Patient with intermittent malonic stools over the last several weeks. Associated with fatigue and generalized weakness and near syncope. PCP drew labs one day ago until patient to go to [facility] for evaluation. Hemoglobin 9.4. Baseline 12.4. No evidence of active bleeding. Elevated troponin though this appears to be baseline. History of CHF with EF of 25%. Hemodynamically stable. Accepted to Telemetry bed at [facility] due to no GI coverage at [facility]. Gi consult when pt arrives.   Linna Darner, MD [facility] 01/24/2016, 12:35 PM

## 2016-01-24 NOTE — ED Provider Notes (Addendum)
[facility] Emergency Department Provider Note  ____________________________________________  Time seen: Approximately 11:39 AM  I have reviewed the triage vital signs and the nursing notes.   HISTORY  Chief Complaint Chest Pain    HPI Andres Gutierrez is a 82 y.o. male with a history of A. fib on Eliquis, CAD status post MI 2, see HF with cardiomyopathy presenting "because my doctor said I might need a blood transfusion." The patient reports that on Sunday he sat up on the edge of the bed at his nursing home and had 2 episodes of 2 seconds of a sharp left lateral chest wall pain. He went to his primary care physician yesterday and had blood drawn, and today was told that he was anemic. The patient does report generalized fatigue and weakness, dyspnea with exertion only, and lightheadedness with standing. He has had black stools, but these are chronic and he thought they were due to iron supplementation. He denies any nausea or vomiting, fever or hematemesis.   Past Medical History:  Diagnosis Date  . Anemia   . Anxiety   . Atrial fibrillation ([place])   . Cardiomyopathy ([place])   . CHF (congestive heart failure) ([place])   . Chronic kidney disease    kidney stones  . Coronary artery disease   . Cough   . Dysrhythmia   . Hypertension   . Lymphadenopathy, hilar   . MI (myocardial infarction) ([place])    x 2  . Wheezing     Patient Active Problem List   Diagnosis Date Noted  . Chronic systolic heart failure ([place]) 01/17/2016  . Chewing tobacco use 01/17/2016  . Atrial fibrillation with RVR ([place]) 12/23/2015  . Dyspnea on exertion 12/23/2015  . Elevated troponin 12/23/2015  . GERD (gastroesophageal reflux disease) 12/23/2015  . Other iron deficiency anemias 12/20/2015  . Femoral neck fracture, left, closed, initial encounter 11/03/2015  . Chronic atrial fibrillation ([place])   . Coronary artery disease involving native coronary artery of native heart without angina  pectoris   . Acalculous cholecystitis 10/29/2015  . Adenopathy   . CAD (coronary artery disease) 12/22/2014  . HTN (hypertension) 12/22/2014    Past Surgical History:  Procedure Laterality Date  . BRONCHIAL NEEDLE ASPIRATION BIOPSY N/A 12/31/2014   Procedure: BRONCHIAL NEEDLE ASPIRATION BIOPSIES from carina;  Surgeon: Flora Lipps, MD;  Location: [facility] ORS;  Service: Cardiopulmonary;  Laterality: N/A;  . CARDIAC CATHETERIZATION N/A 12/28/2015   Procedure: Left Heart Cath and Cors/Grafts Angiography;  Surgeon: Yolonda Kida, MD;  Location: [place] CV LAB;  Service: Cardiovascular;  Laterality: N/A;  . CORONARY ANGIOPLASTY WITH STENT PLACEMENT    . CORONARY ARTERY BYPASS GRAFT    . ENDOBRONCHIAL ULTRASOUND N/A 12/31/2014   Procedure: ENDOBRONCHIAL ULTRASOUND;  Surgeon: Flora Lipps, MD;  Location: [facility] ORS;  Service: Cardiopulmonary;  Laterality: N/A;  . ESOPHAGOGASTRODUODENOSCOPY (EGD) WITH PROPOFOL N/A 06/20/2015   Procedure: ESOPHAGOGASTRODUODENOSCOPY (EGD) WITH PROPOFOL;  Surgeon: Lollie Sails, MD;  Location: [facility] ENDOSCOPY;  Service: Endoscopy;  Laterality: N/A;    Current Outpatient Rx  . Order #: [id number] Class: Normal  . Order #: [id number] Class: Historical Med  . Order #: [id number] Class: Normal  . Order #: [id number] Class: Normal  . Order #: [id number] Class: Normal  . Order #: [id number] Class: No Print  . Order #: [id number] Class: Print  . Order #: [id number] Class: No Print  . Order #: [id number] Class: Print  . Order #: [id number] Class: Historical Med  . Order #: [id number] Class: Historical Med  . Order #:  [id number] Class: Historical Med  . Order #: [id number] Class: Normal  . Order #: [id number] Class: Historical Med  . Order #: [id number] Class: Historical Med  . Order #: [id number] Class: Historical Med  . Order #: [id number] Class: Normal    Allergies Diphenhydramine hcl; Escitalopram; Maxidex [dexamethasone]; Prednisone; Serotonin; Vytorin [ezetimibe-simvastatin]; and Zocor  [simvastatin]  Family History  Problem Relation Age of Onset  . CAD Mother   . Colon cancer Father     Social History Social History  Substance Use Topics  . Smoking status: Former Smoker    Quit date: 06/27/1977  . Smokeless tobacco: Current User    Types: Chew  . Alcohol use Yes     Comment: occational almost rare once a year    Review of Systems Constitutional: No fever/chills.Positive lightheadedness with standing. Negative syncope. Positive generalized fatigue. Eyes: No visual changes. ENT: No sore throat. No congestion or rhinorrhea. Cardiovascular: Denies chest pain. Denies palpitations. Respiratory: Denies shortness of breath.  No cough. Gastrointestinal: No abdominal pain.  No nausea, no vomiting.  No diarrhea.  No constipation. Positive melena. Genitourinary: Negative for dysuria. Musculoskeletal: Negative for back pain. Skin: Negative for rash. Neurological: Negative for headaches. No focal numbness, tingling or weakness.   10-point ROS otherwise negative.  ____________________________________________   PHYSICAL EXAM:  VITAL SIGNS: ED Triage Vitals  Enc Vitals Group     BP 01/24/16 1022 (!) 141/63     Pulse Rate 01/24/16 1022 70     Resp 01/24/16 1022 20     Temp 01/24/16 1022 98 F (36.7 C)     Temp Source 01/24/16 1022 Oral     SpO2 01/24/16 1022 100 %     Weight 01/24/16 1021 185 lb (83.9 kg)     Height 01/24/16 1021 6\' 4"  (1.93 m)     Head Circumference --      Peak Flow --      Pain Score --      Pain Loc --      Pain Edu? --      Excl. in [place]? --     Constitutional: Alert and oriented. Chronically ill appearing but nontoxic. Answers questions appropriately. Eyes: Conjunctivae are pale.  EOMI. No scleral icterus. Head: Atraumatic. Nose: No congestion/rhinnorhea. Mouth/Throat: Mucous membranes are dry.  Neck: No stridor.  Supple.  No JVD. Cardiovascular: Normal rate, regular rhythm. No murmurs, rubs or gallops.  Respiratory: Normal  respiratory effort.  No accessory muscle use or retractions. Lungs CTAB.  No wheezes or rales, positive rhonchi in the right lower lobe. Gastrointestinal: Soft, nontender and nondistended.  No guarding or rebound.  No peritoneal signs. Genitourinary: No evidence of external hemorrhoids are palpable internal hemorrhoids. Stool is black and guaiac positive. No pain with rectal examination. Musculoskeletal: No LE edema. No ttp in the calves or palpable cords.  Negative Homan's sign. Neurologic:  A&Ox3.  Speech is clear.  Face and smile are symmetric.  EOMI.  Moves all extremities well. Skin:  Skin is warm, dry and intact. No rash noted. Psychiatric: Mood and affect are normal. Speech and behavior are normal.  Normal judgement.  ____________________________________________   LABS (all labs ordered are listed, but only abnormal results are displayed)  Labs Reviewed  BASIC METABOLIC PANEL - Abnormal; Notable for the following:       Result Value   Sodium 133 (*)    Glucose, Bld 113 (*)    Creatinine, Ser 1.27 (*)    Calcium 8.8 (*)    GFR calc non Af Wyvonnia Lora  51 (*)    GFR calc Af Amer 59 (*)    All other components within normal limits  CBC - Abnormal; Notable for the following:    RBC 2.77 (*)    Hemoglobin 9.4 (*)    HCT 27.0 (*)    RDW 19.8 (*)    All other components within normal limits  TROPONIN I - Abnormal; Notable for the following:    Troponin I 0.04 (*)    All other components within normal limits  PROTIME-INR - Abnormal; Notable for the following:    Prothrombin Time 19.7 (*)    All other components within normal limits  PREPARE RBC (CROSSMATCH)  TYPE AND SCREEN   ____________________________________________  EKG  ED ECG REPORT I, Eula Listen, the attending physician, personally viewed and interpreted this ECG.   Date: 01/24/2016  EKG Time: 1023  Rate: 70  Rhythm: normal sinus rhythm, LBBB  Axis: Normal  Intervals:first-degree A-V block   ST&T Change:  Left bundle branch block that does not meet [place] criteria  EKG is compared to previous 12/26/15 with left bundle branch block that is old, and no new morphology.  ____________________________________________  RADIOLOGY  Dg Chest 2 View  Result Date: 01/24/2016 CLINICAL DATA:  Chest pain off and on for 2 days, diagnosed with CHF 1 month ago, coronary artery disease post MI, coronary PTCA and CABG, hypertension, atrial fibrillation, former smoker EXAM: CHEST  2 VIEW COMPARISON:  12/26/2015 radiograph, CT chest 10/28/2015 FINDINGS: Enlargement of cardiac silhouette post CABG. Mediastinal contours and pulmonary vascularity normal. Emphysematous and mild bronchitic changes question COPD. Asymmetric density at LEFT apex versus RIGHT at a site where a cavitary lesion was seen on the prior CT. No acute infiltrate, pleural effusion, or pneumothorax. Bones unremarkable. IMPRESSION: Enlargement of cardiac silhouette post CABG. COPD changes with asymmetric density at LEFT apex versus RIGHT, with the cavitary lesion identified on the prior CT exam not well visualized on current radiograph; followup CT chest recommended to exclude developing RIGHT apex nodule. Electronically Signed   By: Lavonia Dana M.D.   On: 01/24/2016 11:25    ____________________________________________   PROCEDURES  Procedure(s) performed: None  Procedures  Critical Care performed: No ____________________________________________   INITIAL IMPRESSION / ASSESSMENT AND PLAN / ED COURSE  Pertinent labs & imaging results that were available during my care of the patient were reviewed by me and considered in my medical decision making (see chart for details).  82 y.o. male on L quest for A. fib presenting with anemia and guaiac-positive melena on examination. The patient has been having some symptoms that may be consistent with anemia, so plan to transfuse him 1 unit of blood. Patient will be admitted to the hospital. He does have  some mild renal insufficiency, which I will treat with IV fluids and have the hospitalist follow.  The patient chronically has an elevated troponin, and today it is in the same range as usual. He does not have any chest pain or syncope which would be concerning for ACS or MI.  ----------------------------------------- 12:11 PM on 01/24/2016 -----------------------------------------  I spoken with the GI specialist at [facility] for transfer as we do not have GI coverage today. Internal medicine for admission has been paged.  ----------------------------------------- 12:39 PM on 01/24/2016 -----------------------------------------  The patient has been accepted for transfer at [facility].  ____________________________________________  FINAL CLINICAL IMPRESSION(S) / ED DIAGNOSES  Final diagnoses:  Gastrointestinal hemorrhage with melena  Symptomatic anemia    Clinical Course  NEW MEDICATIONS STARTED DURING THIS VISIT:  New Prescriptions   No medications on file      Eula Listen, MD 01/24/16 1151    Eula Listen, MD 01/24/16 1212    Eula Listen, MD 01/24/16 1240

## 2016-01-24 NOTE — Consult Note (Signed)
Reason for Consult: Guaiac positive anemia in patient on blood thinners Referring Physician: Hospital team  Andres Gutierrez is an 82 y.o. male.  HPI: Patient seen and examined and his case discussed with the ER physician in [place] as well as the patient and his daughter and all his GI workup and medical care has been in [place] and although some of the records are able to be found in care everywhere the ER physician was not able to tell me whether he had a previous GI workup or not and the hospitalist in [place] refused to admit him however I think he would've been fine being admitted for observation and having GI consult tomorrow when they were available particularly in the location of their choice and by the doctors who know him and that being said he has lost some weight over the last year and did have a CT in December and an endoscopy in February and a colonoscopy in 2015 for polyps and was started on his blood thinner about 6 months ago and they continued his aspirin as well and he does have chronic anemia and was found to be slightly lower than his baseline and his stools are black he says because of iron and he tends to be constipated and occasionally uses Senokot but he has no upper tract symptoms and no other complaints and is on a pump inhibitor at home according to the daughter  Past Medical History:  Diagnosis Date  . Anemia   . Anxiety   . Atrial fibrillation ([place])   . Cardiomyopathy ([place])   . CHF (congestive heart failure) ([place])   . Chronic kidney disease    kidney stones  . Coronary artery disease   . Cough   . Dysrhythmia   . Hypertension   . Lymphadenopathy, hilar   . MI (myocardial infarction) ([place])    x 2  . Wheezing     Past Surgical History:  Procedure Laterality Date  . BRONCHIAL NEEDLE ASPIRATION BIOPSY N/A 12/31/2014   Procedure: BRONCHIAL NEEDLE ASPIRATION BIOPSIES from carina;  Surgeon: Flora Lipps, MD;  Location: [facility] ORS;  Service: Cardiopulmonary;   Laterality: N/A;  . CARDIAC CATHETERIZATION N/A 12/28/2015   Procedure: Left Heart Cath and Cors/Grafts Angiography;  Surgeon: Yolonda Kida, MD;  Location: [facility];  Service: Cardiovascular;  Laterality: N/A;  . CORONARY ANGIOPLASTY WITH STENT PLACEMENT    . CORONARY ARTERY BYPASS GRAFT    . ENDOBRONCHIAL ULTRASOUND N/A 12/31/2014   Procedure: ENDOBRONCHIAL ULTRASOUND;  Surgeon: Flora Lipps, MD;  Location: [facility] ORS;  Service: Cardiopulmonary;  Laterality: N/A;  . ESOPHAGOGASTRODUODENOSCOPY (EGD) WITH PROPOFOL N/A 06/20/2015   Procedure: ESOPHAGOGASTRODUODENOSCOPY (EGD) WITH PROPOFOL;  Surgeon: Lollie Sails, MD;  Location: [facility] ENDOSCOPY;  Service: Endoscopy;  Laterality: N/A;    Family History  Problem Relation Age of Onset  . CAD Mother   . Colon cancer Father     Social History:  reports that he quit smoking about 38 years ago. His smokeless tobacco use includes Chew. He reports that he drinks alcohol. He reports that he does not use drugs.  Allergies:  Allergies  Allergen Reactions  . 5ht3 Receptor Antagonists   . Diphenhydramine Hcl Other (See Comments)    Reaction:  Rash and fever a long time ago, but has had it since with no problem.  . Escitalopram Other (See Comments)    Reaction:  Makes him feel faint, like he was going to have a heart attack.  Celene Squibb [  Dexamethasone] Other (See Comments)    Reaction:  Unknown   . Prednisone Other (See Comments)    Pt states that this medication made him feel crazy.    . Serotonin Other (See Comments)    Tried 2 different types, lexapro and another one.  Reaction:  Made him feel like he was having a heart attack.   . Vytorin [Ezetimibe-Simvastatin] Other (See Comments)    Reaction:  Unknown   . Zocor [Simvastatin] Other (See Comments)    Reaction:  Unknown     Medications: I have reviewed the patient's current medications.  Results for orders placed or performed during the hospital encounter of 01/24/16 (from  the past 48 hour(s))  Basic metabolic panel     Status: Abnormal   Collection Time: 01/24/16 10:33 AM  Result Value Ref Range   Sodium 133 (L) 135 - 145 mmol/L   Potassium 4.2 3.5 - 5.1 mmol/L   Chloride 101 101 - 111 mmol/L   CO2 25 22 - 32 mmol/L   Glucose, Bld 113 (H) 65 - 99 mg/dL   BUN 16 6 - 20 mg/dL   Creatinine, Ser 1.27 (H) 0.61 - 1.24 mg/dL   Calcium 8.8 (L) 8.9 - 10.3 mg/dL   GFR calc non Af Amer 51 (L) >60 mL/min   GFR calc Af Amer 59 (L) >60 mL/min    Comment: (NOTE) The eGFR has been calculated using the CKD EPI equation. This calculation has not been validated in all clinical situations. eGFR's persistently <60 mL/min signify possible Chronic Kidney Disease.    Anion gap 7 5 - 15  CBC     Status: Abnormal   Collection Time: 01/24/16 10:33 AM  Result Value Ref Range   WBC 9.0 3.8 - 10.6 K/uL   RBC 2.77 (L) 4.40 - 5.90 MIL/uL   Hemoglobin 9.4 (L) 13.0 - 18.0 g/dL   HCT 27.0 (L) 40.0 - 52.0 %   MCV 97.6 80.0 - 100.0 fL   MCH 33.9 26.0 - 34.0 pg   MCHC 34.7 32.0 - 36.0 g/dL   RDW 19.8 (H) 11.5 - 14.5 %   Platelets 330 150 - 440 K/uL  Troponin I     Status: Abnormal   Collection Time: 01/24/16 10:33 AM  Result Value Ref Range   Troponin I 0.04 (HH) <0.03 ng/mL    Comment: CRITICAL RESULT CALLED TO, READ BACK BY AND VERIFIED WITH AMBER JONES AT 1111 ON 01/24/16.Marland KitchenMarland KitchenHughes Springs (order if Patient is taking Coumadin / Warfarin)     Status: Abnormal   Collection Time: 01/24/16 10:33 AM  Result Value Ref Range   Prothrombin Time 19.7 (H) 11.4 - 15.2 seconds   INR 1.65   ABO/Rh     Status: None   Collection Time: 01/24/16 10:33 AM  Result Value Ref Range   ABO/RH(D) A POS   Type and screen [place]     Status: None (Preliminary result)   Collection Time: 01/24/16 11:47 AM  Result Value Ref Range   ABO/RH(D) A POS    Antibody Screen NEG    Sample Expiration 01/27/2016    Unit Number [id number]    Blood Component Type RED CELLS,LR     Unit division 00    Status of Unit ISSUED    Transfusion Status OK TO TRANSFUSE    Crossmatch Result Compatible   Prepare RBC     Status: None   Collection Time: 01/24/16 12:04 PM  Result Value Ref Range  Order Confirmation ORDER PROCESSED BY BLOOD BANK     Dg Chest 2 View  Result Date: 01/24/2016 CLINICAL DATA:  Chest pain off and on for 2 days, diagnosed with CHF 1 month ago, coronary artery disease post MI, coronary PTCA and CABG, hypertension, atrial fibrillation, former smoker EXAM: CHEST  2 VIEW COMPARISON:  12/26/2015 radiograph, CT chest 10/28/2015 FINDINGS: Enlargement of cardiac silhouette post CABG. Mediastinal contours and pulmonary vascularity normal. Emphysematous and mild bronchitic changes question COPD. Asymmetric density at LEFT apex versus RIGHT at a site where a cavitary lesion was seen on the prior CT. No acute infiltrate, pleural effusion, or pneumothorax. Bones unremarkable. IMPRESSION: Enlargement of cardiac silhouette post CABG. COPD changes with asymmetric density at LEFT apex versus RIGHT, with the cavitary lesion identified on the prior CT exam not well visualized on current radiograph; followup CT chest recommended to exclude developing RIGHT apex nodule. Electronically Signed   By: Lavonia Dana M.D.   On: 01/24/2016 11:25    ROS negative except above Height _0  (1.93 m), weight 85.1 kg (187 lb 9.8 oz). Physical Exam Vital signs stable afebrile no acute distress lungs are clear heart irregular irregular abdomen is soft nontender labs reviewed guaiac positivity Assessment/Plan: Multiple medical problems in a patient with anemia and guaiac positivity on aspirin and blood thinner Plan: Continue clear liquids for now and consider repeat endoscopy or capsule endoscopy or even CT scan pending on labs tomorrow but possibly could be discharged with close GI follow-up and possibly could be transferred back to [place] tomorrow when they're/his GI doctors are available  and would ask cardiology their opinion on whether he needs both blood thinner and aspirin going forward  [place] 01/24/2016, 6:04 PM

## 2016-01-25 DIAGNOSIS — I2510 Atherosclerotic heart disease of native coronary artery without angina pectoris: Secondary | ICD-10-CM | POA: Diagnosis not present

## 2016-01-25 DIAGNOSIS — I5022 Chronic systolic (congestive) heart failure: Secondary | ICD-10-CM | POA: Diagnosis not present

## 2016-01-25 DIAGNOSIS — I482 Chronic atrial fibrillation: Secondary | ICD-10-CM

## 2016-01-25 DIAGNOSIS — R7989 Other specified abnormal findings of blood chemistry: Secondary | ICD-10-CM | POA: Diagnosis not present

## 2016-01-25 DIAGNOSIS — K922 Gastrointestinal hemorrhage, unspecified: Secondary | ICD-10-CM | POA: Diagnosis not present

## 2016-01-25 DIAGNOSIS — D62 Acute posthemorrhagic anemia: Secondary | ICD-10-CM | POA: Diagnosis not present

## 2016-01-25 LAB — BASIC METABOLIC PANEL
ANION GAP: 7 (ref 5–15)
BUN: 13 mg/dL (ref 6–20)
CO2: 28 mmol/L (ref 22–32)
Calcium: 9 mg/dL (ref 8.9–10.3)
Chloride: 100 mmol/L — ABNORMAL LOW (ref 101–111)
Creatinine, Ser: 1.22 mg/dL (ref 0.61–1.24)
GFR calc Af Amer: >60 mL/min (ref >60)
GFR, EST NON AFRICAN AMERICAN: 53 mL/min — AB (ref >60)
GLUCOSE: 95 mg/dL (ref 65–99)
POTASSIUM: 5.2 mmol/L — AB (ref 3.5–5.1)
Sodium: 135 mmol/L (ref 135–145)

## 2016-01-25 LAB — CBC
HCT: 27 % — ABNORMAL LOW (ref 39.0–52.0)
Hemoglobin: 8.8 g/dL — ABNORMAL LOW (ref 13.0–17.0)
MCH: 32.5 pg (ref 26.0–34.0)
MCHC: 32.6 g/dL (ref 30.0–36.0)
MCV: 99.6 fL (ref 78.0–100.0)
Platelets: 307 10*3/uL (ref 150–400)
RBC: 2.71 MIL/uL — AB (ref 4.22–5.81)
RDW: 20.2 % — ABNORMAL HIGH (ref 11.5–15.5)
WBC: 5.1 10*3/uL (ref 4.0–10.5)

## 2016-01-25 LAB — TYPE AND SCREEN
ABO/RH(D): A POS
Antibody Screen: NEGATIVE
UNIT DIVISION: 0

## 2016-01-25 LAB — PREPARE RBC (CROSSMATCH)

## 2016-01-25 LAB — DIGOXIN LEVEL: DIGOXIN LVL: 2.2 ng/mL — AB (ref 0.8–2.0)

## 2016-01-25 LAB — TROPONIN I: TROPONIN I: 0.04 ng/mL — AB (ref <0.03)

## 2016-01-25 NOTE — Care Management Note (Signed)
Case Management Note  Patient Details  Name: Andres Gutierrez MRN: [id number] Date of Birth: March 07, 1931  Subjective/Objective:                 [facility] [place] [place]. Admitted with GI bleed.   Action/Plan:  Anticipate pt will DC to SNF when medically stable. Expected Discharge Date:                  Expected Discharge Plan:  Skilled Nursing Facility  In-House Referral:  Clinical Social Work  Discharge planning Services  CM Consult  Post Acute Care Choice:  NA Choice offered to:  NA  DME Arranged:  N/A DME Agency:  NA  HH Arranged:  NA HH Agency:  NA  Status of Service:  Completed, signed off  If discussed at [place] of Stay Meetings, dates discussed:    Additional Comments:  Carles Collet, RN 01/25/2016, 10:46 AM

## 2016-01-25 NOTE — Care Management CC44 (Signed)
Condition Code 44 Documentation Completed  Patient Details  Name: Andres Gutierrez MRN: [id number] Date of Birth: 1930/11/04   Condition Code 44 given:  Yes Patient signature on Condition Code 44 notice:  Yes Documentation of 2 MD's agreement:  Yes Code 44 added to claim:  Yes    Carles Collet, RN 01/25/2016, 3:38 PM

## 2016-01-25 NOTE — NC FL2 (Signed)
[place] LEVEL OF CARE SCREENING TOOL     IDENTIFICATION  Patient Name: Andres Gutierrez Birthdate: [birth year] Sex: male Admission Date (Current Location): 01/24/2016  [facility] and [place] Number:  Engineering geologist and Address:  [facility] [place], [place] [address] [place], [place] [address]      Provider Number: [id number]  Attending Physician Name and Address:  Orson Eva, MD  Relative Name and Phone Number:  Tiana Loft Daughter [phone]    Current Level of Care: Hospital Recommended Level of Care: [place] Prior Approval Number:    Date Approved/Denied:   PASRR Number: [id number]  Discharge Plan: SNF    Current Diagnoses: Patient Active Problem List   Diagnosis Date Noted  . GI bleed 01/24/2016  . Acute blood loss anemia 01/24/2016  . Chronic systolic heart failure ([place]) 01/17/2016  . Chewing tobacco use 01/17/2016  . Atrial fibrillation with RVR ([place]) 12/23/2015  . Dyspnea on exertion 12/23/2015  . Elevated troponin 12/23/2015  . GERD (gastroesophageal reflux disease) 12/23/2015  . Other iron deficiency anemias 12/20/2015  . Femoral neck fracture, left, closed, initial encounter 11/03/2015  . Chronic atrial fibrillation ([place])   . Coronary artery disease involving native coronary artery of native heart without angina pectoris   . Acalculous cholecystitis 10/29/2015  . Adenopathy   . CAD (coronary artery disease) 12/22/2014  . HTN (hypertension) 12/22/2014    Orientation RESPIRATION BLADDER Height & Weight     Self, Time, Situation, Place  Normal Continent Weight: 85.1 kg (187 lb 9.8 oz) Height:  6\' 4"  (193 cm)  BEHAVIORAL SYMPTOMS/MOOD NEUROLOGICAL BOWEL NUTRITION STATUS      Continent Diet (Please see DC Summary)  AMBULATORY STATUS COMMUNICATION OF NEEDS Skin   Limited Assist Verbally Normal                       Personal Care Assistance Level of Assistance  Bathing, Feeding, Dressing Bathing  Assistance: Limited assistance Feeding assistance: Independent Dressing Assistance: Limited assistance     Functional Limitations Info  Hearing, Sight, Speech Sight Info: Adequate Hearing Info: Adequate Speech Info: Adequate    SPECIAL CARE FACTORS FREQUENCY                       Contractures Contractures Info: Not present    Additional Factors Info  Code Status, Allergies Code Status Info: Full Allergies Info: 5ht3 Receptor Antagonists, Diphenhydramine Hcl, Escitalopram, Maxidex Dexamethasone, Prednisone, Serotonin, Vytorin Ezetimibe-simvastatin, Zocor Simvastatin           Current Medications (01/25/2016):  This is the current hospital active medication list Current Facility-Administered Medications  Medication Dose Route Frequency Provider Last Rate Last Dose  . acetaminophen (TYLENOL) tablet 650 mg  650 mg Oral Q6H PRN Domenic Polite, MD      . atorvastatin (LIPITOR) tablet 40 mg  40 mg Oral q1800 Domenic Polite, MD   40 mg at 01/24/16 2246  . HYDROcodone-acetaminophen (NORCO/VICODIN) 5-325 MG per tablet 1 tablet  1 tablet Oral Q6H PRN Domenic Polite, MD      . metoprolol tartrate (LOPRESSOR) tablet 12.5 mg  12.5 mg Oral BID Domenic Polite, MD   12.5 mg at 01/25/16 1113  . nitroGLYCERIN (NITROSTAT) SL tablet 0.4 mg  0.4 mg Sublingual Q5 min PRN Domenic Polite, MD      . ondansetron [facility]) tablet 4 mg  4 mg Oral Q6H PRN Domenic Polite, MD  Or  . ondansetron (ZOFRAN) injection 4 mg  4 mg Intravenous Q6H PRN Domenic Polite, MD      . pantoprazole (PROTONIX) injection 40 mg  40 mg Intravenous Q12H Domenic Polite, MD   40 mg at 01/25/16 1112  . ranolazine (RANEXA) 12 hr tablet 500 mg  500 mg Oral BID Domenic Polite, MD   500 mg at 01/25/16 1113  . tamsulosin (FLOMAX) capsule 0.4 mg  0.4 mg Oral QPC supper Domenic Polite, MD      . vitamin C (ASCORBIC ACID) tablet 250 mg  250 mg Oral Daily Domenic Polite, MD   250 mg at 01/25/16 1112     Discharge  Medications: Please see discharge summary for a list of discharge medications.  Relevant Imaging Results:  Relevant Lab Results:   Additional Information [id number]  Benard Halsted, LCSWA

## 2016-01-25 NOTE — Consult Note (Signed)
   [facility] CM Inpatient Consult   01/25/2016  BURDELL PEED 11-17-1930 [id number]   Patient screened for potential [place] Management services for 5 hospitalizations 2 ED visits in the past 6 months noted. Patient is eligible for [facility] Care Management services under patient's Health Team Advantage Medicare Plan.    Please place a [facility] Care Management consult or for questions contact:   Natividad Brood, RN BSN [facility] Liaison  [phone] business mobile phone Toll free office [phone]

## 2016-01-25 NOTE — Progress Notes (Signed)
   01/25/16 1330  Clinical Encounter Type  Visited With Patient  Visit Type Other (Comment) (Consult)  Referral From Nurse;Physician  Consult/Referral To Chaplain  Spiritual Encounters  Spiritual Needs Prayer  Stress Factors  Patient Stress Factors None identified  Offered prayer and comfort to Pt. Per consult.

## 2016-01-25 NOTE — Clinical Social Work Note (Signed)
Clinical Social Work Assessment  Patient Details  Name: Andres Gutierrez MRN: [id number] Date of Birth: Mar 25, 1931  Date of referral:  01/25/16               Reason for consult:  Facility Placement                Permission sought to share information with:  Facility Sport and exercise psychologist, Family Supports Permission granted to share information::  Yes, Verbal Permission Granted  Name::     Jewel Baize::  SNFs  Relationship::  Daughter  Contact Information:  [phone]  Housing/Transportation Living arrangements for the past 2 months:  [place], [place] of Information:  Patient Patient Interpreter Needed:  None Criminal Activity/Legal Involvement Pertinent to Current Situation/Hospitalization:  No - Comment as needed Significant Relationships:  Adult Children Lives with:  Self, Facility Resident Do you feel safe going back to the place where you live?  Yes Need for family participation in patient care:  No (Coment)  Care giving concerns:  CSW received consult for discharge planning. CSW met with patient. Patient stated he came from [facility] SNF and would like to return at discharge. CSW to continue to follow and assist with discharge planning needs.   Social Worker assessment / plan:  CSW spoke with patient regarding return to SNF at discharge.   Employment status:  Retired Forensic scientist:  Managed Care PT Recommendations:  Not assessed at this time Information / Referral to community resources:  [place]  Patient/Family's Response to care:  Patient expressed understanding of discharge process and is looking forward to being able to leave the hospital. He states patient's daughter is also very involved in his care and will be visiting him later today.   Patient/Family's Understanding of and Emotional Response to Diagnosis, Current Treatment, and Prognosis:  Patient reported no questions or concerns at  this time.   Emotional Assessment Appearance:  Appears stated age Attitude/Demeanor/Rapport:  Other (Appropriate) Affect (typically observed):  Accepting, Appropriate Orientation:  Oriented to Self, Oriented to Place, Oriented to  Time, Oriented to Situation Alcohol / Substance use:  Not Applicable Psych involvement (Current and /or in the community):  No (Comment)  Discharge Needs  Concerns to be addressed:  Care Coordination Readmission within the last 30 days:  Yes Current discharge risk:  None Barriers to Discharge:  Continued Medical Work up   [employer], [place] 01/25/2016, 3:33 PM

## 2016-01-25 NOTE — Progress Notes (Addendum)
[facility] Gastroenterology Progress Note  Andres Gutierrez 82 y.o. [birth year]   Subjective: Patient is doing better. Denied abdominal pain. Complaining of on and off chest discomfort. No bowel movement since yesterday  Objective: Vital signs in last 24 hours: There were no vitals filed for this visit.  Physical Exam:  General:  Alert, cooperative, no distress, appears stated age  Head:  Normocephalic, without obvious abnormality, atraumatic     Lungs:   Clear to auscultation bilaterally, respirations unlabored  Heart:  Regular rate and rhythm, S1, S2 normal  Abdomen:   Soft, non-tender, bowel sounds active all four quadrants,  no masses,           Lab Results:  Recent Labs  01/24/16 1033 01/25/16 0546  NA 133* 135  K 4.2 5.2*  CL 101 100*  CO2 25 28  GLUCOSE 113* 95  BUN 16 13  CREATININE 1.27* 1.22  CALCIUM 8.8* 9.0   No results for input(s): AST, ALT, ALKPHOS, BILITOT, PROT, ALBUMIN in the last 72 hours.  Recent Labs  01/24/16 1033 01/25/16 0546  WBC 9.0 5.1  HGB 9.4* 8.8*  HCT 27.0* 27.0*  MCV 97.6 99.6  PLT 330 307    Recent Labs  01/24/16 1033  LABPROT 19.7*  INR 1.65      Assessment/Plan: ? Melena in setting of a Eliquis  and aspirin use. No bowel movement since yesterday. -  Acute blood loss anemia. Baseline hemoglobin of around 12. - Ischemic cardiomyopathy with EF of 25%.  Paroxysmal atrial fibrillation on Eliquis. CAD  Recommendations -------------------------- - Patient with no bowel movement since yesterday. Less likely to have active bleeding . Start clear liquid diet.  BID PPI for now.  - Recommend transfer back to  for further care/continuity of care. Eliquis is currently on hold.  - No need for endoscopic workup at this time. - Monitor H&H.   [name] 01/25/2016, 7:55 AM  Pager [phone]  If no answer or after 5 PM call [phone]

## 2016-01-25 NOTE — Progress Notes (Signed)
PROGRESS NOTE  Andres Gutierrez DTO:[id number] DOB: 1930-07-29 DOA: 01/24/2016 PCP: Rusty Aus, MD  Brief History:  82 y.o. male with PMH of CAD, s/p CABG, ischemic cardiomyopathy EF 25%, P Afib on Eliquis was recently admitted to [facility] regional with chest pain from 8/14 through 8/21, at that time he had extensive cardiac workup with a stress test and Cardiac cath: Cardiac cath noted patent grafts and 75% lesion at insertion of ramus and medical management was recommended and sent to Rehab on 8/21, where is currently, pt reports intermittent episodes of L sided chest pain in axillary line on Sunday and earlier this am. In addition also reported increasing weakness, and has noted darker stools over the last few weeks which he attributed to Iron. He was seen in [place] ER today and noted to have Hb of 9.4 which is a 3gm drop from baseline of 12-13. Rectal exam noted Heme positive black stool, EDP d/w GI at [facility] and Transferred pt here due to lack of GI coverage at [facility]. Pt received 1 unit PRBC transfusion in ER prior to transfer   Assessment/Plan: 1. Anemia with heme positive stools -hold eliquis  -h/o EGD 2/17 with grade A esophagitis and gastritis -continue IV protonix 40mg q12 -[facility] consulted--no intervention -s/p 1 unit PRBC -am CBC -pt restarted iron 3 weeks PTA  2. Recurrent chest pain/Elevated troponin -s/p CABG, s/p Cath( 12/28/15) with patent grafts and 75% lesion to insertion of RIMA, he also had a stress test then and Medical management recommended by his Cardiologists at [facility], followed by Dr.Kenneth Fath -demand ischemia -troponins flat -continue Metoprolol and statin, hold ASA due to #1 -continue Ranexa, no chest pain at this time  3.  Chronic atrial fibrillation (HCC) -HR controlled -continue metoprolol and hold eliquis -hold digoxin as level 2.2 -CHADSVASc = 5 -01/25/16--discussed cased with pt\'s cardiologist, Dr. Ken Fath--with concerns  of GIB, d/c apixaban, plan to d/c with ASA  4. Ischemic cardiomyopathy/EF 25% -compensated, hold lasix today -resume in 1-2days  5. HTN -per Pt, his BP running lower recently, hold lasix, BP stable now -ACE stopped recently    Disposition Plan:   SNF 9/14 if stable  Family Communication:  No Family at bedside--Total time spent 35 minutes.  Greater than 50% spent face to face counseling and coordinating care.   Consultants:  [facility]  Code Status:  FULL / DNR  DVT Prophylaxis:  SCDs   Procedures: As Listed in Progress Note Above  Antibiotics: None    Subjective: Patient denies fevers, chills, headache, chest pain, dyspnea, nausea, vomiting, diarrhea, abdominal pain, dysuria, hematuria, hematochezia, and melena.   Objective: Vitals:   01/24/16 1611 01/25/16 1236  BP:  (!) 110/48  Pulse:  60  Resp:  17  Temp:  98.2 F (36.8 C)  TempSrc:  Oral  SpO2:  98%  Weight: 85.1 kg (187 lb 9.8 oz)   Height: 6\' 4" (1.93 m)     Intake/Output Summary (Last 24 hours) at 01/25/16 1451 Last data filed at 01/25/16 0534  Gross per 24 hour  Intake              300 ml  Output              60 0 ml  Net             -300 ml   Weight change:  Exam:   General:  Pt is alert, follows  commands appropriately, not in acute distress  HEENT: No icterus, No thrush, No neck mass, [place]/AT  Cardiovascular: RRR, S1/S2, no rubs, no gallops  Respiratory: CTA bilaterally, no wheezing, no crackles, no rhonchi  Abdomen: Soft/+BS, non tender, non distended, no guarding  Extremities: No edema, No lymphangitis, No petechiae, No rashes, no synovitis   Data Reviewed: I have personally reviewed following labs and imaging studies Basic Metabolic Panel:  Recent Labs Lab 01/24/16 1033 01/25/16 0546  NA 133* 135  K 4.2 5.2*  CL 101 100*  CO2 25 28  GLUCOSE 113* 95  BUN 16 13  CREATININE 1.27* 1.22  CALCIUM 8.8* 9.0   Liver Function Tests: No results for input(s): AST, ALT, ALKPHOS,  BILITOT, PROT, ALBUMIN in the last 168 hours. No results for input(s): LIPASE, AMYLASE in the last 168 hours. No results for input(s): AMMONIA in the last 168 hours. Coagulation Profile:  Recent Labs Lab 01/24/16 1033  INR 1.65   CBC:  Recent Labs Lab 01/24/16 1033 01/25/16 0546  WBC 9.0 5.1  HGB 9.4* 8.8*  HCT 27.0* 27.0*  MCV 97.6 99.6  PLT 330 307   Cardiac Enzymes:  Recent Labs Lab 01/24/16 1033 01/24/16 1757 01/24/16 2308  TROPONINI 0.04* 0.04* 0.04*   BNP: Invalid input(s): POCBNP CBG: No results for input(s): GLUCAP in the last 168 hours. HbA1C: No results for input(s): HGBA1C in the last 72 hours. Urine analysis:    Component Value Date/Time   COLORURINE YELLOW (A) 12/23/2015 1921   APPEARANCEUR HAZY (A) 12/23/2015 1921   LABSPEC 1.014 12/23/2015 1921   PHURINE 5.0 12/23/2015 1921   GLUCOSEU NEGATIVE 12/23/2015 1921   HGBUR NEGATIVE 12/23/2015 1921   BILIRUBINUR NEGATIVE 12/23/2015 1921   KETONESUR NEGATIVE 12/23/2015 1921   PROTEINUR NEGATIVE 12/23/2015 1921   UROBILINOGEN 0.2 09/11/2010 0647   NITRITE NEGATIVE 12/23/2015 1921   LEUKOCYTESUR NEGATIVE 12/23/2015 1921   Sepsis Labs: @LABRCNTIP (procalcitonin:4,lacticidven:4) )No results found for this or any previous visit (from the past 240 hour(s)).   Scheduled Meds: . atorvastatin  40 mg Oral q1800  . metoprolol tartrate  12.5 mg Oral BID  . pantoprazole (PROTONIX) IV  40 mg Intravenous Q12H  . ranolazine  500 mg Oral BID  . tamsulosin  0.4 mg Oral QPC supper  . ascorbic acid  250 mg Oral Daily   Continuous Infusions:   Procedures/Studies: Dg Chest 2 View  Result Date: 01/24/2016 CLINICAL DATA:  Chest pain off and on for 2 days, diagnosed with CHF 1 month ago, coronary artery disease post MI, coronary PTCA and CABG, hypertension, atrial fibrillation, former smoker EXAM: CHEST  2 VIEW COMPARISON:  12/26/2015 radiograph, CT chest 10/28/2015 FINDINGS: Enlargement of cardiac silhouette post  CABG. Mediastinal contours and pulmonary vascularity normal. Emphysematous and mild bronchitic changes question COPD. Asymmetric density at LEFT apex versus RIGHT at a site where a cavitary lesion was seen on the prior CT. No acute infiltrate, pleural effusion, or pneumothorax. Bones unremarkable. IMPRESSION: Enlargement of cardiac silhouette post CABG. COPD changes with asymmetric density at LEFT apex versus RIGHT, with the cavitary lesion identified on the prior CT exam not well visualized on current radiograph; followup CT chest recommended to exclude developing RIGHT apex nodule. Electronically Signed   By: Lavonia Dana M.D.   On: 01/24/2016 11:25   Nm Myocar Multi W/spect W/wall Motion / Ef  Result Date: 12/29/2015  Blood pressure demonstrated a normal response to exercise.  There was no ST segment deviation noted during stress.  Defect 1:  There is a small defect of mild severity present in the apical anterior location.  Findings consistent with ischemia.  This is an intermediate risk study.  The left ventricular ejection fraction is severely decreased (<30%).    Dg Abd 2 Views  Result Date: 01/01/2016 CLINICAL DATA:  Pt has been hospitalized since the 14th but today he started to experience vomiting and nausea but no abdominal pain. EXAM: ABDOMEN - 2 VIEW COMPARISON:  CT the abdomen and pelvis of 10/28/2015. FINDINGS: Upright and supine views. The upright view images only the upper abdomen. Minimal right-sided abdominal fluid levels are likely within the colon. No free intraperitoneal air. Prior median sternotomy. The supine view demonstrates gas within normal caliber colon. No small bowel distension. Low pelvis excluded. IMPRESSION: No specific evidence of bowel obstruction. Minimal fluid levels within the right-sided abdomen are favored to be within the colon. Electronically Signed   By: Abigail Miyamoto M.D.   On: 01/01/2016 14:22    Nelline Lio, DO  [facility] Pager [phone]  If  7PM-7AM, please contact night-coverage [email or web address] Password [email or web address] 01/25/2016, 2:51 PM   LOS: 1 day

## 2016-01-26 ENCOUNTER — Other Ambulatory Visit: Payer: Self-pay

## 2016-01-26 DIAGNOSIS — D509 Iron deficiency anemia, unspecified: Secondary | ICD-10-CM | POA: Diagnosis not present

## 2016-01-26 DIAGNOSIS — K922 Gastrointestinal hemorrhage, unspecified: Secondary | ICD-10-CM | POA: Diagnosis not present

## 2016-01-26 DIAGNOSIS — I2510 Atherosclerotic heart disease of native coronary artery without angina pectoris: Secondary | ICD-10-CM

## 2016-01-26 DIAGNOSIS — R7989 Other specified abnormal findings of blood chemistry: Secondary | ICD-10-CM | POA: Diagnosis not present

## 2016-01-26 DIAGNOSIS — I5022 Chronic systolic (congestive) heart failure: Secondary | ICD-10-CM | POA: Diagnosis not present

## 2016-01-26 DIAGNOSIS — I10 Essential (primary) hypertension: Secondary | ICD-10-CM | POA: Diagnosis not present

## 2016-01-26 DIAGNOSIS — K2971 Gastritis, unspecified, with bleeding: Secondary | ICD-10-CM | POA: Diagnosis not present

## 2016-01-26 DIAGNOSIS — I482 Chronic atrial fibrillation: Secondary | ICD-10-CM | POA: Diagnosis not present

## 2016-01-26 DIAGNOSIS — K297 Gastritis, unspecified: Secondary | ICD-10-CM | POA: Diagnosis not present

## 2016-01-26 DIAGNOSIS — M21371 Foot drop, right foot: Secondary | ICD-10-CM | POA: Diagnosis not present

## 2016-01-26 DIAGNOSIS — R079 Chest pain, unspecified: Secondary | ICD-10-CM | POA: Diagnosis not present

## 2016-01-26 DIAGNOSIS — D62 Acute posthemorrhagic anemia: Secondary | ICD-10-CM | POA: Diagnosis not present

## 2016-01-26 DIAGNOSIS — R262 Difficulty in walking, not elsewhere classified: Secondary | ICD-10-CM | POA: Diagnosis not present

## 2016-01-26 DIAGNOSIS — M6281 Muscle weakness (generalized): Secondary | ICD-10-CM | POA: Diagnosis not present

## 2016-01-26 DIAGNOSIS — Z741 Need for assistance with personal care: Secondary | ICD-10-CM | POA: Diagnosis not present

## 2016-01-26 DIAGNOSIS — G609 Hereditary and idiopathic neuropathy, unspecified: Secondary | ICD-10-CM | POA: Diagnosis not present

## 2016-01-26 LAB — CBC
HCT: 27.2 % — ABNORMAL LOW (ref 39.0–52.0)
Hemoglobin: 8.8 g/dL — ABNORMAL LOW (ref 13.0–17.0)
MCH: 32.4 pg (ref 26.0–34.0)
MCHC: 32.4 g/dL (ref 30.0–36.0)
MCV: 100 fL (ref 78.0–100.0)
PLATELETS: 285 10*3/uL (ref 150–400)
RBC: 2.72 MIL/uL — ABNORMAL LOW (ref 4.22–5.81)
RDW: 19.6 % — AB (ref 11.5–15.5)
WBC: 5.1 10*3/uL (ref 4.0–10.5)

## 2016-01-26 LAB — URINALYSIS, ROUTINE W REFLEX MICROSCOPIC
Bilirubin Urine: NEGATIVE
GLUCOSE, UA: NEGATIVE mg/dL
HGB URINE DIPSTICK: NEGATIVE
KETONES UR: NEGATIVE mg/dL
Nitrite: NEGATIVE
PROTEIN: NEGATIVE mg/dL
Specific Gravity, Urine: 1.009 (ref 1.005–1.030)
pH: 7 (ref 5.0–8.0)

## 2016-01-26 LAB — BASIC METABOLIC PANEL
ANION GAP: 8 (ref 5–15)
BUN: 11 mg/dL (ref 6–20)
CALCIUM: 8.8 mg/dL — AB (ref 8.9–10.3)
CO2: 26 mmol/L (ref 22–32)
Chloride: 100 mmol/L — ABNORMAL LOW (ref 101–111)
Creatinine, Ser: 1.09 mg/dL (ref 0.61–1.24)
GFR calc Af Amer: >60 mL/min (ref >60)
GLUCOSE: 93 mg/dL (ref 65–99)
Potassium: 4 mmol/L (ref 3.5–5.1)
SODIUM: 134 mmol/L — AB (ref 135–145)

## 2016-01-26 LAB — URINE MICROSCOPIC-ADD ON
RBC / HPF: NONE SEEN RBC/hpf (ref 0–5)
Squamous Epithelial / LPF: NONE SEEN

## 2016-01-26 MED ORDER — DIGOXIN 62.5 MCG PO TABS: 0.0625 mg | ORAL_TABLET | Freq: Every day | ORAL | 0 refills | Status: DC

## 2016-01-26 MED ORDER — CEFUROXIME AXETIL 500 MG PO TABS
500.0000 mg | ORAL_TABLET | Freq: Two times a day (BID) | ORAL | Status: DC
  Administered 2016-01-26: 500 mg via ORAL
  Filled 2016-01-26: qty 1

## 2016-01-26 MED ORDER — HYDROCODONE-ACETAMINOPHEN 5-325 MG PO TABS: 1.0000 | ORAL_TABLET | Freq: Four times a day (QID) | ORAL | 0 refills | Status: DC | PRN

## 2016-01-26 MED ORDER — CEFUROXIME AXETIL 500 MG PO TABS: 500.0000 mg | ORAL_TABLET | Freq: Two times a day (BID) | ORAL | Status: DC

## 2016-01-26 MED ORDER — LINACLOTIDE 72 MCG PO CAPS: 72.0000 ug | ORAL_CAPSULE | Freq: Every day | ORAL | 0 refills | Status: DC

## 2016-01-26 MED ORDER — SULFAMETHOXAZOLE-TRIMETHOPRIM 800-160 MG PO TABS: 1.0000 | ORAL_TABLET | Freq: Two times a day (BID) | ORAL | 0 refills | Status: DC

## 2016-01-26 MED ORDER — PANTOPRAZOLE SODIUM 40 MG PO TBEC: 40.0000 mg | DELAYED_RELEASE_TABLET | Freq: Two times a day (BID) | ORAL | Status: DC

## 2016-01-26 MED ORDER — CEFUROXIME AXETIL 500 MG PO TABS: 500.0000 mg | ORAL_TABLET | Freq: Two times a day (BID) | ORAL | 0 refills | Status: DC

## 2016-01-26 MED ORDER — SULFAMETHOXAZOLE-TRIMETHOPRIM 800-160 MG PO TABS: 1.0000 | ORAL_TABLET | Freq: Two times a day (BID) | ORAL | Status: DC

## 2016-01-26 NOTE — Consult Note (Signed)
   [facility] CM Inpatient Consult   01/26/2016  Andres Gutierrez [birth year] [id number]   Update:  Met with the patient to follow up on care needs.  Patient states he is having difficulty with his blood pressure when he stands and cannot take but a few steps before getting very dizzy. He states that his wife died 30 months ago and he lives alone.  His daughter checks on him daily when he is home and brings him food for dinner. He states his son lives nearby. Explained to the patient about post facility follow up with [facility]. He states he remembers someone calling him and he wasn't sure if there was a charge for the services.  He states that he has had home health, when gets out of the facility.  He said he was at the facility for 23 days at [facility] facility. Explained that this is a no cost service for him.  Patient signed consent for when he returns home for [place] [facility] services.  A folder with contact information was given as well. For questions, please contact:  Natividad Brood, RN BSN [facility] Liaison  [phone] business mobile phone Toll free office [phone]

## 2016-01-26 NOTE — Progress Notes (Signed)
Patient will discharge to [facility] Anticipated discharge date:9/14 Family notified: Nona Dell by Corey Harold- called at 4:30pm  CSW signing off.  Jorge Ny, [place] Social Worker [phone]

## 2016-01-26 NOTE — Progress Notes (Signed)
01/26/16  1745  Called report to [place]

## 2016-01-26 NOTE — Discharge Summary (Addendum)
Physician Discharge Summary  Andres Gutierrez FIE:[id number] DOB: May 08, 1931 DOA: 01/24/2016  PCP: Rusty Aus, MD  Admit date: 01/24/2016 Discharge date: 01/26/2016  Admitted From: SNF Disposition:  SNF  Recommendations for Outpatient Follow-up:  1. Follow up with PCP in 1-2 weeks 2. Please obtain BMP/CBC in one week 3. Please check digoxin level on 01/30/16   Discharge Condition: Stable CODE STATUS: FULL Diet recommendation: Heart Healthy   Brief/Interim Summary: 82 y.o.malewith PMH of CAD, s/p CABG, ischemic cardiomyopathy EF 25%, P Afib on Eliquis was recently admitted to [facility] regional with chest pain from 8/14 through 8/21, at that time he had extensive cardiac workup with a stress test and Cardiac cath: Cardiac cath noted patent grafts and 75% lesion at insertion of ramus and medical management was recommended and sent to Rehab on 8/21, where is currently, pt reports intermittent episodes of L sided chest pain in axillary line on Sunday and earlier this am. In addition also reported increasing weakness, and has noted darker stools over the last few weeks which he attributed to Iron. He was seen in [place] ER today and noted to have Hb of 9.4 which is a 3gm drop from baseline of 12-13. Rectal exam noted Heme positive black stool, EDP d/w GI at [facility] and Transferred pt here due to lack of GI coverage at [facility]. Pt received 1 unit PRBC transfusion in ER prior to transfer After admission to [facility], the patient remained hemodynamically stable. He did not have any further melanotic stools. However, the patient stated that he had been placed back on iron for the past 3 weeks. His hemoglobin remained stable throughout the hospitalization. GI was consulted but they deferred any further intervention. The patient's cardiologist was contacted concerning his anticoagulation. In the setting of likely GI bleed, his apixiban will be discontinued. The patient will be discharged with aspirin  81 mg daily.  Discharge Diagnoses:  1. Anemia with heme positive stools -hold eliquis  -h/o EGD 2/17 with grade A esophagitis and gastritis -continue IV protonix 40mg  q12 -[facility] consulted--no intervention -s/p 1 unit PRBC -Hemoglobin has remained stable throughout the hospitalization. -pt restarted iron 80 weeks PTA  2. Recurrent chest pain/Elevated troponin -s/p CABG, s/p Cath( 12/28/15) with patent grafts and 75% lesion to insertion of RIMA, he also had a stress test then and Medical management recommended by his Cardiologists at [facility], followed by Dr.Kenneth Fath -demand ischemia -troponins flat -continue Metoprolol and statin, restart ASA after discharge -continue Ranexa, no chest pain at this time  3. Chronic atrial fibrillation (HCC) -HR controlled -continue metoprolol and hold eliquis -hold digoxin as level 2.2 -The patient's digoxin dose will be reduced to 0.0625 mg daily due to his elevated digoxin level--to restart on 01/27/2016 -CHADSVASc = 5 -01/25/16--discussed cased with pt's cardiologist, Dr. Virl Son concerns of GIB, d/c apixaban, plan to d/c with ASA  4. Ischemic cardiomyopathy/EF 25% -compensated, hold lasix today--restart furosemide after discharge -Clinically euvolemic  5. HTN -per Pt, his BP running lower recently, hold lasix, BP stable now -ACE stopped recently  6. Constipation -case discussed with Dr. Altamese Dilling Magod--d/c back to SNF with low dose Linzess  7.  Pyuria -empirically start patient on cefuroxime 500 mg bid x  5 days   Discharge Instructions  Discharge Instructions    Diet - low sodium heart healthy    Complete by:  As directed    Increase activity slowly    Complete by:  As directed  Medication List    STOP taking these medications   apixaban 5 MG Tabs tablet Commonly known as:  ELIQUIS   magnesium oxide 400 (241.3 Mg) MG tablet Commonly known as:  MAG-OX     TAKE these medications   acetaminophen 325 MG  tablet Commonly known as:  TYLENOL Take 650 mg by mouth every 6 (six) hours as needed for mild pain.   ascorbic acid 250 MG tablet Commonly known as:  VITAMIN C Take 1 tablet (250 mg total) by mouth daily.   aspirin EC 81 MG tablet Take 81 mg by mouth daily.   atorvastatin 40 MG tablet Commonly known as:  LIPITOR Take 1 tablet (40 mg total) by mouth daily at 6 PM.   cefUROXime 500 MG tablet Commonly known as:  CEFTIN Take 1 tablet (500 mg total) by mouth 2 (two) times daily with a meal.   Digoxin 62.5 MCG Tabs Take 0.0625 mg by mouth daily. Start taking on:  01/27/2016 What changed:  medication strength  how much to take   ferrous sulfate 325 (65 FE) MG tablet Take 1 tablet (325 mg total) by mouth daily.   furosemide 20 MG tablet Commonly known as:  LASIX Take 20 mg by mouth as needed. For weight gain greater than 8 pounds in 24 hours or greater than 5 pounds in one week   HYDROcodone-acetaminophen 5-325 MG tablet Commonly known as:  NORCO/VICODIN Take 1 tablet by mouth every 6 (six) hours as needed for moderate pain or severe pain.   linaclotide 72 MCG capsule Commonly known as:  LINZESS Take 1 capsule (72 mcg total) by mouth daily before breakfast. Start taking on:  01/27/2016   metoprolol tartrate 12.5 mg Tabs tablet Commonly known as:  LOPRESSOR Take 12.5 mg by mouth 2 (two) times daily. What changed:  Another medication with the same name was removed. Continue taking this medication, and follow the directions you see here.   nitroGLYCERIN 0.4 MG SL tablet Commonly known as:  NITROSTAT Place 0.4 mg under the tongue every 5 (five) minutes as needed for chest pain.   pantoprazole 40 MG tablet Commonly known as:  PROTONIX Take 40 mg by mouth 2 (two) times daily before a meal.   potassium chloride 10 MEQ tablet Commonly known as:  K-DUR Take 10 mEq by mouth 2 (two) times daily.   ranolazine 500 MG 12 hr tablet Commonly known as:  RANEXA Take 1 tablet (500  mg total) by mouth 2 (two) times daily.   senna 8.6 MG tablet Commonly known as:  SENOKOT Take 1 tablet by mouth at bedtime as needed for constipation.   sucralfate 1 g tablet Commonly known as:  CARAFATE Take 1 g by mouth 2 (two) times daily.   tamsulosin 0.4 MG Caps capsule Commonly known as:  FLOMAX Take 0.4 mg by mouth daily after supper.       Allergies  Allergen Reactions  . 5ht3 Receptor Antagonists   . Diphenhydramine Hcl Other (See Comments)    Reaction:  Rash and fever a long time ago, but has had it since with no problem.  . Escitalopram Other (See Comments)    Reaction:  Makes him feel faint, like he was going to have a heart attack.  . Maxidex [Dexamethasone] Other (See Comments)    Reaction:  Unknown   . Prednisone Other (See Comments)    Pt states that this medication made him feel crazy.    . Serotonin Other (See Comments)  Tried 2 different types, lexapro and another one.  Reaction:  Made him feel like he was having a heart attack.   . Vytorin [Ezetimibe-Simvastatin] Other (See Comments)    Reaction:  Unknown   . Zocor [Simvastatin] Other (See Comments)    Reaction:  Unknown     Consultations:  [facility]   Procedures/Studies: Dg Chest 2 View  Result Date: 01/24/2016 CLINICAL DATA:  Chest pain off and on for 2 days, diagnosed with CHF 1 month ago, coronary artery disease post MI, coronary PTCA and CABG, hypertension, atrial fibrillation, former smoker EXAM: CHEST  2 VIEW COMPARISON:  12/26/2015 radiograph, CT chest 10/28/2015 FINDINGS: Enlargement of cardiac silhouette post CABG. Mediastinal contours and pulmonary vascularity normal. Emphysematous and mild bronchitic changes question COPD. Asymmetric density at LEFT apex versus RIGHT at a site where a cavitary lesion was seen on the prior CT. No acute infiltrate, pleural effusion, or pneumothorax. Bones unremarkable. IMPRESSION: Enlargement of cardiac silhouette post CABG. COPD changes with asymmetric  density at LEFT apex versus RIGHT, with the cavitary lesion identified on the prior CT exam not well visualized on current radiograph; followup CT chest recommended to exclude developing RIGHT apex nodule. Electronically Signed   By: Lavonia Dana M.D.   On: 01/24/2016 11:25   Nm Myocar Multi W/spect W/wall Motion / Ef  Result Date: 12/29/2015  Blood pressure demonstrated a normal response to exercise.  There was no ST segment deviation noted during stress.  Defect 1: There is a small defect of mild severity present in the apical anterior location.  Findings consistent with ischemia.  This is an intermediate risk study.  The left ventricular ejection fraction is severely decreased (<30%).    Dg Abd 2 Views  Result Date: 01/01/2016 CLINICAL DATA:  Pt has been hospitalized since the 14th but today he started to experience vomiting and nausea but no abdominal pain. EXAM: ABDOMEN - 2 VIEW COMPARISON:  CT the abdomen and pelvis of 10/28/2015. FINDINGS: Upright and supine views. The upright view images only the upper abdomen. Minimal right-sided abdominal fluid levels are likely within the colon. No free intraperitoneal air. Prior median sternotomy. The supine view demonstrates gas within normal caliber colon. No small bowel distension. Low pelvis excluded. IMPRESSION: No specific evidence of bowel obstruction. Minimal fluid levels within the right-sided abdomen are favored to be within the colon. Electronically Signed   By: Abigail Miyamoto M.D.   On: 01/01/2016 14:22        Discharge Exam: Vitals:   01/26/16 0917 01/26/16 1441  BP: (!) 116/56 (!) 126/54  Pulse: 72 63  Resp:  18  Temp:  97.6 F (36.4 C)   Vitals:   01/25/16 2245 01/26/16 0518 01/26/16 0917 01/26/16 1441  BP: (!) 101/53 (!) 110/54 (!) 116/56 (!) 126/54  Pulse: 78 75 72 63  Resp: 18 18  18   Temp: 98.2 F (36.8 C) 98 F (36.7 C)  97.6 F (36.4 C)  TempSrc: Oral Oral  Oral  SpO2: 97% 98% 98% 98%  Weight:      Height:          General: Pt is alert, awake, not in acute distress Cardiovascular: RRR, S1/S2 +, no rubs, no gallops Respiratory: CTA bilaterally, no wheezing, no rhonchi Abdominal: Soft, NT, ND, bowel sounds + Extremities: no edema, no cyanosis   The results of significant diagnostics from this hospitalization (including imaging, microbiology, ancillary and laboratory) are listed below for reference.    Significant Diagnostic Studies: Dg Chest  2 View  Result Date: 01/24/2016 CLINICAL DATA:  Chest pain off and on for 2 days, diagnosed with CHF 1 month ago, coronary artery disease post MI, coronary PTCA and CABG, hypertension, atrial fibrillation, former smoker EXAM: CHEST  2 VIEW COMPARISON:  12/26/2015 radiograph, CT chest 10/28/2015 FINDINGS: Enlargement of cardiac silhouette post CABG. Mediastinal contours and pulmonary vascularity normal. Emphysematous and mild bronchitic changes question COPD. Asymmetric density at LEFT apex versus RIGHT at a site where a cavitary lesion was seen on the prior CT. No acute infiltrate, pleural effusion, or pneumothorax. Bones unremarkable. IMPRESSION: Enlargement of cardiac silhouette post CABG. COPD changes with asymmetric density at LEFT apex versus RIGHT, with the cavitary lesion identified on the prior CT exam not well visualized on current radiograph; followup CT chest recommended to exclude developing RIGHT apex nodule. Electronically Signed   By: Lavonia Dana M.D.   On: 01/24/2016 11:25   Nm Myocar Multi W/spect W/wall Motion / Ef  Result Date: 12/29/2015  Blood pressure demonstrated a normal response to exercise.  There was no ST segment deviation noted during stress.  Defect 1: There is a small defect of mild severity present in the apical anterior location.  Findings consistent with ischemia.  This is an intermediate risk study.  The left ventricular ejection fraction is severely decreased (<30%).    Dg Abd 2 Views  Result Date: 01/01/2016 CLINICAL DATA:   Pt has been hospitalized since the 14th but today he started to experience vomiting and nausea but no abdominal pain. EXAM: ABDOMEN - 2 VIEW COMPARISON:  CT the abdomen and pelvis of 10/28/2015. FINDINGS: Upright and supine views. The upright view images only the upper abdomen. Minimal right-sided abdominal fluid levels are likely within the colon. No free intraperitoneal air. Prior median sternotomy. The supine view demonstrates gas within normal caliber colon. No small bowel distension. Low pelvis excluded. IMPRESSION: No specific evidence of bowel obstruction. Minimal fluid levels within the right-sided abdomen are favored to be within the colon. Electronically Signed   By: Abigail Miyamoto M.D.   On: 01/01/2016 14:22     Microbiology: No results found for this or any previous visit (from the past 240 hour(s)).   Labs: Basic Metabolic Panel:  Recent Labs Lab 01/24/16 1033 01/25/16 0546 01/26/16 0640  NA 133* 135 134*  K 4.2 5.2* 4.0  CL 101 100* 100*  CO2 25 28 26   GLUCOSE 113* 95 93  BUN 16 13 11   CREATININE 1.27* 1.22 1.09  CALCIUM 8.8* 9.0 8.8*   Liver Function Tests: No results for input(s): AST, ALT, ALKPHOS, BILITOT, PROT, ALBUMIN in the last 168 hours. No results for input(s): LIPASE, AMYLASE in the last 168 hours. No results for input(s): AMMONIA in the last 168 hours. CBC:  Recent Labs Lab 01/24/16 1033 01/25/16 0546 01/26/16 0640  WBC 9.0 5.1 5.1  HGB 9.4* 8.8* 8.8*  HCT 27.0* 27.0* 27.2*  MCV 97.6 99.6 100.0  PLT 330 307 285   Cardiac Enzymes:  Recent Labs Lab 01/24/16 1033 01/24/16 1757 01/24/16 2308  TROPONINI 0.04* 0.04* 0.04*   BNP: Invalid input(s): POCBNP CBG: No results for input(s): GLUCAP in the last 168 hours.  Time coordinating discharge:  Greater than 30 minutes  Signed:  Shellsea Borunda, DO [facility] Pager: [phone] 01/26/2016, 3:11 PM

## 2016-01-26 NOTE — Evaluation (Signed)
Physical Therapy Evaluation Patient Details Name: Andres Gutierrez MRN: [id number] DOB: Aug 13, 1930 Today's Date: 01/26/2016   History of Present Illness  pt is an 82 y/o male with pmh of CAD, s/p CABG, ICM admitted from [facility] with heme positive black stool, low Hgb due to lack of GI coverage at [facility].  Clinical Impression  Pt admitted with/for anemia and GI consult.  Pt currently limited functionally due to the problems listed. ( See problems list.)   Pt will benefit from PT to maximize function and safety in order to get ready for next venue listed below.     Follow Up Recommendations SNF    Equipment Recommendations  None recommended by PT    Recommendations for Other Services       Precautions / Restrictions Precautions Precautions: Fall      Mobility  Bed Mobility Overal bed mobility: Needs Assistance Bed Mobility: Supine to Sit;Sit to Supine     Supine to sit: Supervision Sit to supine: Supervision   General bed mobility comments: extra time, but no assist needed  Transfers Overall transfer level: Needs assistance Equipment used: Rolling walker (2 wheeled) Transfers: Sit to/from Stand Sit to Stand: Min guard         General transfer comment: cues for hand placement at times  Ambulation/Gait Ambulation/Gait assistance: Min guard;Mod assist (mod when pt about to pass out) Ambulation Distance (Feet): 35 Feet (the 22 feet after recouping from low BP ( to recheck BP)) Assistive device: Rolling walker (2 wheeled) Gait Pattern/deviations: Step-through pattern Gait velocity: moderate to slow Gait velocity interpretation: Below normal speed for age/gender General Gait Details: mildly unsteady and flexed/weak-kneed gait, stability assist and increasing dizziness ( determined due to orthostatics).  Needing more assist  as  BP lowered.  Stairs            Wheelchair Mobility    Modified Rankin (Stroke Patients Only)       Balance Overall balance  assessment: Needs assistance Sitting-balance support: No upper extremity supported Sitting balance-[name]: Good     Standing balance support: Bilateral upper extremity supported Standing balance-[name]: Poor Standing balance comment: reliant on the RW                             Pertinent Vitals/Pain Pain Assessment: No/denies pain    Home Living Family/patient expects to be discharged to:: Skilled nursing facility                      Prior Function Level of Independence: Independent with assistive device(s)         Comments: Pt performs household mobility and basic ADLs mod I with RW. Pt's daughter checks in daily and delivers meals. Pt's grandson is present any time he needs to shower. Does not leave the house much, daughter drives him when he does.      Hand Dominance        Extremity/Trunk Assessment   Upper Extremity Assessment: Defer to OT evaluation           Lower Extremity Assessment: Overall WFL for tasks assessed;Generalized weakness (bil strength grossly 4/5)         Communication   Communication: HOH  Cognition Arousal/Alertness: Awake/alert   Overall Cognitive Status: Within Functional Limits for tasks assessed                      General Comments General  comments (skin integrity, edema, etc.): First trial of gait pt became apparently orthostatic and collapsed on the bed.  Tried a second gait trial after full recovery and stood to take a BP reading.  Pt sinking as if passing out.  BP lying 151/89  BP standing before collapse  45/34.    Exercises        Assessment/Plan    PT Assessment Patient needs continued PT services  PT Diagnosis Difficulty walking;Generalized weakness   PT Problem List Decreased strength;Decreased activity tolerance;Decreased balance;Decreased mobility;Decreased knowledge of use of DME  PT Treatment Interventions Gait training;Functional mobility training;DME  instruction;Therapeutic activities;Patient/family education   PT Goals (Current goals can be found in the Care Plan section) Acute Rehab PT Goals Patient Stated Goal: Get stronger PT Goal Formulation: With patient Time For Goal Achievement: 02/02/16 Potential to Achieve Goals: Good    Frequency Min 2X/week   Barriers to discharge        Co-evaluation               End of Session Equipment Utilized During Treatment: Gait belt Activity Tolerance: Other (comment) (limited by lower BP) Patient left: in bed;with call bell/phone within reach;with bed alarm set Nurse Communication: Mobility status    Functional Assessment Tool Used: clinical judgement Functional Limitation: Mobility: Walking and moving around Mobility: Walking and Moving Around Current Status (I7867): At least 20 percent but less than 40 percent impaired, limited or restricted Mobility: Walking and Moving Around Goal Status [phone]): At least 1 percent but less than 20 percent impaired, limited or restricted    Time: 1155-1230 PT Time Calculation (min) (ACUTE ONLY): 35 min   Charges:   PT Evaluation $PT Eval Moderate Complexity: 1 Procedure PT Treatments $Gait Training: 8-22 mins   PT G Codes:   PT G-Codes **NOT FOR INPATIENT CLASS** Functional Assessment Tool Used: clinical judgement Functional Limitation: Mobility: Walking and moving around Mobility: Walking and Moving Around Current Status (O7096): At least 20 percent but less than 40 percent impaired, limited or restricted Mobility: Walking and Moving Around Goal Status [phone]): At least 1 percent but less than 20 percent impaired, limited or restricted    Gratia Disla, Tessie Fass 01/26/2016, 12:44 PM 01/26/2016  Donnella Sham, [place] [phone]  (pager)

## 2016-01-26 NOTE — Progress Notes (Signed)
[facility]  Faxed over discharge summary and they will have MD to write prescription for antibiotic.

## 2016-01-29 LAB — URINE CULTURE

## 2016-02-07 DIAGNOSIS — I10 Essential (primary) hypertension: Secondary | ICD-10-CM | POA: Diagnosis not present

## 2016-02-07 DIAGNOSIS — I2510 Atherosclerotic heart disease of native coronary artery without angina pectoris: Secondary | ICD-10-CM | POA: Diagnosis not present

## 2016-02-07 DIAGNOSIS — I5022 Chronic systolic (congestive) heart failure: Secondary | ICD-10-CM | POA: Diagnosis not present

## 2016-02-07 DIAGNOSIS — D509 Iron deficiency anemia, unspecified: Secondary | ICD-10-CM | POA: Diagnosis not present

## 2016-02-08 DIAGNOSIS — M21371 Foot drop, right foot: Secondary | ICD-10-CM | POA: Diagnosis not present

## 2016-02-08 DIAGNOSIS — G609 Hereditary and idiopathic neuropathy, unspecified: Secondary | ICD-10-CM | POA: Diagnosis not present

## 2016-02-12 DIAGNOSIS — R079 Chest pain, unspecified: Secondary | ICD-10-CM | POA: Diagnosis not present

## 2016-02-12 DIAGNOSIS — I5022 Chronic systolic (congestive) heart failure: Secondary | ICD-10-CM | POA: Diagnosis not present

## 2016-02-12 DIAGNOSIS — D500 Iron deficiency anemia secondary to blood loss (chronic): Secondary | ICD-10-CM | POA: Diagnosis not present

## 2016-02-12 DIAGNOSIS — R7989 Other specified abnormal findings of blood chemistry: Secondary | ICD-10-CM | POA: Diagnosis not present

## 2016-02-12 DIAGNOSIS — R0609 Other forms of dyspnea: Secondary | ICD-10-CM | POA: Diagnosis not present

## 2016-02-12 DIAGNOSIS — Z720 Tobacco use: Secondary | ICD-10-CM | POA: Diagnosis not present

## 2016-02-12 DIAGNOSIS — I480 Paroxysmal atrial fibrillation: Secondary | ICD-10-CM | POA: Diagnosis not present

## 2016-02-12 DIAGNOSIS — Z9189 Other specified personal risk factors, not elsewhere classified: Secondary | ICD-10-CM | POA: Diagnosis not present

## 2016-02-12 DIAGNOSIS — K2971 Gastritis, unspecified, with bleeding: Secondary | ICD-10-CM | POA: Diagnosis not present

## 2016-02-15 ENCOUNTER — Ambulatory Visit: Payer: PPO | Admitting: Family

## 2016-02-16 DIAGNOSIS — I2510 Atherosclerotic heart disease of native coronary artery without angina pectoris: Secondary | ICD-10-CM | POA: Diagnosis not present

## 2016-02-16 DIAGNOSIS — I5022 Chronic systolic (congestive) heart failure: Secondary | ICD-10-CM | POA: Diagnosis not present

## 2016-02-16 DIAGNOSIS — Z720 Tobacco use: Secondary | ICD-10-CM | POA: Diagnosis not present

## 2016-02-16 DIAGNOSIS — Z7982 Long term (current) use of aspirin: Secondary | ICD-10-CM | POA: Diagnosis not present

## 2016-02-16 DIAGNOSIS — I482 Chronic atrial fibrillation: Secondary | ICD-10-CM | POA: Diagnosis not present

## 2016-02-16 DIAGNOSIS — I255 Ischemic cardiomyopathy: Secondary | ICD-10-CM | POA: Diagnosis not present

## 2016-02-16 DIAGNOSIS — D500 Iron deficiency anemia secondary to blood loss (chronic): Secondary | ICD-10-CM | POA: Diagnosis not present

## 2016-02-16 DIAGNOSIS — K2971 Gastritis, unspecified, with bleeding: Secondary | ICD-10-CM | POA: Diagnosis not present

## 2016-02-16 DIAGNOSIS — I110 Hypertensive heart disease with heart failure: Secondary | ICD-10-CM | POA: Diagnosis not present

## 2016-02-20 DIAGNOSIS — I255 Ischemic cardiomyopathy: Secondary | ICD-10-CM | POA: Diagnosis not present

## 2016-02-20 DIAGNOSIS — Z720 Tobacco use: Secondary | ICD-10-CM | POA: Diagnosis not present

## 2016-02-20 DIAGNOSIS — D500 Iron deficiency anemia secondary to blood loss (chronic): Secondary | ICD-10-CM | POA: Diagnosis not present

## 2016-02-20 DIAGNOSIS — Z7982 Long term (current) use of aspirin: Secondary | ICD-10-CM | POA: Diagnosis not present

## 2016-02-20 DIAGNOSIS — I5022 Chronic systolic (congestive) heart failure: Secondary | ICD-10-CM | POA: Diagnosis not present

## 2016-02-20 DIAGNOSIS — K2971 Gastritis, unspecified, with bleeding: Secondary | ICD-10-CM | POA: Diagnosis not present

## 2016-02-20 DIAGNOSIS — I482 Chronic atrial fibrillation: Secondary | ICD-10-CM | POA: Diagnosis not present

## 2016-02-20 DIAGNOSIS — I2510 Atherosclerotic heart disease of native coronary artery without angina pectoris: Secondary | ICD-10-CM | POA: Diagnosis not present

## 2016-02-20 DIAGNOSIS — I110 Hypertensive heart disease with heart failure: Secondary | ICD-10-CM | POA: Diagnosis not present

## 2016-02-23 ENCOUNTER — Encounter: Payer: Self-pay | Admitting: Family

## 2016-02-23 ENCOUNTER — Ambulatory Visit: Payer: PPO | Attending: Family | Admitting: Family

## 2016-02-23 VITALS — BP 140/70 | HR 69 | Resp 18 | Ht 76.0 in | Wt 187.0 lb

## 2016-02-23 DIAGNOSIS — F419 Anxiety disorder, unspecified: Secondary | ICD-10-CM | POA: Diagnosis not present

## 2016-02-23 DIAGNOSIS — I130 Hypertensive heart and chronic kidney disease with heart failure and stage 1 through stage 4 chronic kidney disease, or unspecified chronic kidney disease: Secondary | ICD-10-CM | POA: Insufficient documentation

## 2016-02-23 DIAGNOSIS — I252 Old myocardial infarction: Secondary | ICD-10-CM | POA: Insufficient documentation

## 2016-02-23 DIAGNOSIS — I2510 Atherosclerotic heart disease of native coronary artery without angina pectoris: Secondary | ICD-10-CM | POA: Insufficient documentation

## 2016-02-23 DIAGNOSIS — I482 Chronic atrial fibrillation, unspecified: Secondary | ICD-10-CM

## 2016-02-23 DIAGNOSIS — Z7982 Long term (current) use of aspirin: Secondary | ICD-10-CM | POA: Diagnosis not present

## 2016-02-23 DIAGNOSIS — I5022 Chronic systolic (congestive) heart failure: Secondary | ICD-10-CM | POA: Insufficient documentation

## 2016-02-23 DIAGNOSIS — N189 Chronic kidney disease, unspecified: Secondary | ICD-10-CM | POA: Insufficient documentation

## 2016-02-23 DIAGNOSIS — D649 Anemia, unspecified: Secondary | ICD-10-CM | POA: Insufficient documentation

## 2016-02-23 DIAGNOSIS — Z720 Tobacco use: Secondary | ICD-10-CM | POA: Diagnosis not present

## 2016-02-23 DIAGNOSIS — I10 Essential (primary) hypertension: Secondary | ICD-10-CM

## 2016-02-23 NOTE — Patient Instructions (Signed)
Begin weighing daily and call for an overnight weight gain of > 2 pounds or a weekly weight gain of >5 pounds. 

## 2016-02-23 NOTE — Progress Notes (Signed)
Patient ID: Andres Gutierrez, male    DOB: May 14, 1931, 82 y.o.   MRN: [id number]  HPI  Andres Gutierrez is a 82 y/o male with a history of MI, HTN, CAD, CKD, atrial fibrillation, anxiety, anemia, tobacco use and chronic heart failure.  Last echo was done 12/24/15 and showed an EF of 20-25% with trivial AR and mild Andres. Unchanged from previous echo done June 2017. Cardiac cath was done 12/28/15 Conclusion:   Successful cardiac catheter patent grafts with lesion to the insertion of the ramus   Recommend functional study prior to consideration for intervention   Aggressive medical therapy in the meanwhile.   Case discussed primary cardiology Dr. Ubaldo Glassing and interventionalists at [facility] Dr. Jerelene Redden as well as the patient and family  Was admitted to [facility] on 01/24/16 with anemia resulting in 1 unit of PRBC's given. Was cathed showing patent grafts and 75% lesion to the insertion of the ramus. Was discharged to [facility] and is now back at home receiving physical therapy at home.  Patient presents today for a follow-up visit with fatigue and shortness of breath upon exertion. Does have some swelling in his right lower leg and does not elevate his legs. Has not been weighing himself since discharge from [facility] but does have scales at home to use. Is receiving physical therapy at home twice weekly for now.   Past Medical History:  Diagnosis Date  . Anemia   . Anxiety   . Atrial fibrillation ([place])   . Cardiomyopathy ([place])   . CHF (congestive heart failure) ([place])   . Chronic kidney disease    kidney stones  . Coronary artery disease   . Cough   . Dysrhythmia   . Hypertension   . Lymphadenopathy, hilar   . MI (myocardial infarction)    x 2  . Wheezing     Past Surgical History:  Procedure Laterality Date  . BRONCHIAL NEEDLE ASPIRATION BIOPSY N/A 12/31/2014   Procedure: BRONCHIAL NEEDLE ASPIRATION BIOPSIES from carina;  Surgeon: Flora Lipps, MD;  Location: [facility] ORS;  Service: Cardiopulmonary;  Laterality: N/A;   . CARDIAC CATHETERIZATION N/A 12/28/2015   Procedure: Left Heart Cath and Cors/Grafts Angiography;  Surgeon: Yolonda Kida, MD;  Location: [facility];  Service: Cardiovascular;  Laterality: N/A;  . CORONARY ANGIOPLASTY WITH STENT PLACEMENT    . CORONARY ARTERY BYPASS GRAFT    . ENDOBRONCHIAL ULTRASOUND N/A 12/31/2014   Procedure: ENDOBRONCHIAL ULTRASOUND;  Surgeon: Flora Lipps, MD;  Location: [facility] ORS;  Service: Cardiopulmonary;  Laterality: N/A;  . ESOPHAGOGASTRODUODENOSCOPY (EGD) WITH PROPOFOL N/A 06/20/2015   Procedure: ESOPHAGOGASTRODUODENOSCOPY (EGD) WITH PROPOFOL;  Surgeon: Lollie Sails, MD;  Location: [facility] ENDOSCOPY;  Service: Endoscopy;  Laterality: N/A;    Family History  Problem Relation Age of Onset  . CAD Mother   . Colon cancer Father     Social History  Substance Use Topics  . Smoking status: Former Smoker    Quit date: 06/27/1977  . Smokeless tobacco: Current User    Types: Chew  . Alcohol use Yes     Comment: occational almost rare once a year    Allergies  Allergen Reactions  . 5ht3 Receptor Antagonists   . Diphenhydramine Hcl Other (See Comments)    Reaction:  Rash and fever a long time ago, but has had it since with no problem.  . Escitalopram Other (See Comments)    Reaction:  Makes him feel faint, like he was going to have a heart attack.  Marland Kitchen  Maxidex [Dexamethasone] Other (See Comments)    Reaction:  Unknown   . Prednisone Other (See Comments)    Pt states that this medication made him feel crazy.    . Serotonin Other (See Comments)    Tried 2 different types, lexapro and another one.  Reaction:  Made him feel like he was having a heart attack.   . Vytorin [Ezetimibe-Simvastatin] Other (See Comments)    Reaction:  Unknown   . Zocor [Simvastatin] Other (See Comments)    Reaction:  Unknown     Prior to Admission medications   Medication Sig Start Date End Date Taking? Authorizing Provider  aspirin EC 81 MG tablet Take 81 mg by mouth  daily.   Yes Historical Provider, MD  atorvastatin (LIPITOR) 40 MG tablet Take 1 tablet (40 mg total) by mouth daily at 6 PM. 12/30/15  Yes Dustin Flock, MD  digoxin (LANOXIN) 0.125 MG tablet Take 0.0625 mg by mouth daily.   Yes Historical Provider, MD  ferrous sulfate 325 (65 FE) MG tablet Take 1 tablet (325 mg total) by mouth daily. Patient taking differently: Take 325 mg by mouth every other day.  11/01/15  Yes Dustin Flock, MD  furosemide (LASIX) 20 MG tablet Take 20 mg by mouth as needed. For weight gain greater than 2 pounds in 24 hours or greater than 5 pounds in one week   Yes Historical Provider, MD  linaclotide (LINZESS) 72 MCG capsule Take 1 capsule (72 mcg total) by mouth daily before breakfast. 01/27/16  Yes Orson Eva, MD  metoprolol tartrate (LOPRESSOR) 12.5 mg TABS tablet Take 12.5 mg by mouth 2 (two) times daily.   Yes Historical Provider, MD  nitroGLYCERIN (NITROSTAT) 0.4 MG SL tablet Place 0.4 mg under the tongue every 5 (five) minutes as needed for chest pain.   Yes Historical Provider, MD  pantoprazole (PROTONIX) 40 MG tablet Take 40 mg by mouth 2 (two) times daily before a meal.    Yes Historical Provider, MD  potassium chloride (K-DUR) 10 MEQ tablet Take 10 mEq by mouth 2 (two) times daily.   Yes Historical Provider, MD         senna (SENOKOT) 8.6 MG tablet Take 1 tablet by mouth at bedtime as needed for constipation.   Yes Historical Provider, MD  sucralfate (CARAFATE) 1 G tablet Take 1 g by mouth 2 (two) times daily.    Yes Historical Provider, MD  tamsulosin (FLOMAX) 0.4 MG CAPS capsule Take 0.4 mg by mouth daily after supper.    Yes Historical Provider, MD  vitamin C (VITAMIN C) 250 MG tablet Take 1 tablet (250 mg total) by mouth daily. 11/01/15  Yes Dustin Flock, MD     Review of Systems  Constitutional: Positive for fatigue. Negative for appetite change.  HENT: Positive for hearing loss. Negative for congestion, postnasal drip and sore throat.   Eyes: Negative.    Respiratory: Positive for shortness of breath. Negative for cough and chest tightness.   Cardiovascular: Positive for leg swelling (right lower leg). Negative for chest pain and palpitations.  Gastrointestinal: Negative for abdominal distention and abdominal pain.  Endocrine: Negative.   Genitourinary: Negative.   Musculoskeletal: Negative for back pain and neck pain.  Skin: Negative.   Allergic/Immunologic: Negative.   Neurological: Negative for dizziness and light-headedness.  Hematological: Negative for adenopathy. Bruises/bleeds easily.  Psychiatric/Behavioral: Negative for dysphoric mood and sleep disturbance (sleeping on 1 pillow). The patient is not nervous/anxious.     Vitals:   02/23/16 [id number]  BP: 140/70  Pulse: 69  Resp: 18  SpO2: 99%  Weight: 187 lb (84.8 kg)  Height: 6\' 4"  (1.93 m)     Physical Exam  Constitutional: He is oriented to person, place, and time. He appears well-developed and well-nourished.  HENT:  Head: Normocephalic and atraumatic.  Eyes: Conjunctivae are normal. Pupils are equal, round, and reactive to light.  Neck: Normal range of motion. Neck supple.  Cardiovascular: Normal rate.  An irregular rhythm present.  Pulmonary/Chest: Effort normal. He has no wheezes. He has no rales.  Abdominal: Soft. He exhibits no distension. There is no tenderness.  Musculoskeletal: He exhibits edema (1+ pitting edema in right lower leg). He exhibits no tenderness.  Neurological: He is alert and oriented to person, place, and time.  Skin: Skin is warm and dry.  Psychiatric: He has a normal mood and affect. His behavior is normal. Thought content normal.  Nursing note and vitals reviewed.    Assessment & Plan:  1: Chronic heart failure with reduced ejection fraction- - NYHA Class II - mildly volume overloaded today - Resume weighing daily and take furosemide for an overnight weight gain of >2 pounds/weekly weight gain of >5 pounds.  - Elevate legs when sitting  for long periods. Should swelling in the right lower leg continue, can also take a furosemide - Twice weekly physical therapy; walking with a walker - Consider adding entresto at next visit if his blood pressure still looks good - Already received flu vaccine this season - Sees cardiologist 03/13/16  2: HTN- - BP looks good - Taking furosemide as needed  3: Chronic atrial fibrillation- - Not on eliquis due to history of GI bleed with recent admission for this September 2017. - No longer taking ranexa but is unsure why. He will discuss this with his cardiologist at his 03/13/16 appointment. - No chest pain - Continues on metoprolol and baby aspirin  4: Chewing tobacco- - Continues to chew tobacco and not interested in quitting   Return in 1 month or sooner for any questions/problems before then.

## 2016-02-24 DIAGNOSIS — D500 Iron deficiency anemia secondary to blood loss (chronic): Secondary | ICD-10-CM | POA: Diagnosis not present

## 2016-02-24 DIAGNOSIS — I5022 Chronic systolic (congestive) heart failure: Secondary | ICD-10-CM | POA: Diagnosis not present

## 2016-02-24 DIAGNOSIS — I110 Hypertensive heart disease with heart failure: Secondary | ICD-10-CM | POA: Diagnosis not present

## 2016-02-24 DIAGNOSIS — I482 Chronic atrial fibrillation: Secondary | ICD-10-CM | POA: Diagnosis not present

## 2016-02-24 DIAGNOSIS — I255 Ischemic cardiomyopathy: Secondary | ICD-10-CM | POA: Diagnosis not present

## 2016-02-24 DIAGNOSIS — Z7982 Long term (current) use of aspirin: Secondary | ICD-10-CM | POA: Diagnosis not present

## 2016-02-24 DIAGNOSIS — I2510 Atherosclerotic heart disease of native coronary artery without angina pectoris: Secondary | ICD-10-CM | POA: Diagnosis not present

## 2016-02-24 DIAGNOSIS — K2971 Gastritis, unspecified, with bleeding: Secondary | ICD-10-CM | POA: Diagnosis not present

## 2016-02-24 DIAGNOSIS — Z720 Tobacco use: Secondary | ICD-10-CM | POA: Diagnosis not present

## 2016-02-27 DIAGNOSIS — Z7982 Long term (current) use of aspirin: Secondary | ICD-10-CM | POA: Diagnosis not present

## 2016-02-27 DIAGNOSIS — I2510 Atherosclerotic heart disease of native coronary artery without angina pectoris: Secondary | ICD-10-CM | POA: Diagnosis not present

## 2016-02-27 DIAGNOSIS — I255 Ischemic cardiomyopathy: Secondary | ICD-10-CM | POA: Diagnosis not present

## 2016-02-27 DIAGNOSIS — I482 Chronic atrial fibrillation: Secondary | ICD-10-CM | POA: Diagnosis not present

## 2016-02-27 DIAGNOSIS — I5022 Chronic systolic (congestive) heart failure: Secondary | ICD-10-CM | POA: Diagnosis not present

## 2016-02-27 DIAGNOSIS — K2971 Gastritis, unspecified, with bleeding: Secondary | ICD-10-CM | POA: Diagnosis not present

## 2016-02-27 DIAGNOSIS — Z720 Tobacco use: Secondary | ICD-10-CM | POA: Diagnosis not present

## 2016-02-27 DIAGNOSIS — D500 Iron deficiency anemia secondary to blood loss (chronic): Secondary | ICD-10-CM | POA: Diagnosis not present

## 2016-02-27 DIAGNOSIS — I110 Hypertensive heart disease with heart failure: Secondary | ICD-10-CM | POA: Diagnosis not present

## 2016-03-01 DIAGNOSIS — Z720 Tobacco use: Secondary | ICD-10-CM | POA: Diagnosis not present

## 2016-03-01 DIAGNOSIS — I255 Ischemic cardiomyopathy: Secondary | ICD-10-CM | POA: Diagnosis not present

## 2016-03-01 DIAGNOSIS — I110 Hypertensive heart disease with heart failure: Secondary | ICD-10-CM | POA: Diagnosis not present

## 2016-03-01 DIAGNOSIS — I2510 Atherosclerotic heart disease of native coronary artery without angina pectoris: Secondary | ICD-10-CM | POA: Diagnosis not present

## 2016-03-01 DIAGNOSIS — D500 Iron deficiency anemia secondary to blood loss (chronic): Secondary | ICD-10-CM | POA: Diagnosis not present

## 2016-03-01 DIAGNOSIS — I482 Chronic atrial fibrillation: Secondary | ICD-10-CM | POA: Diagnosis not present

## 2016-03-01 DIAGNOSIS — Z7982 Long term (current) use of aspirin: Secondary | ICD-10-CM | POA: Diagnosis not present

## 2016-03-01 DIAGNOSIS — I5022 Chronic systolic (congestive) heart failure: Secondary | ICD-10-CM | POA: Diagnosis not present

## 2016-03-01 DIAGNOSIS — K2971 Gastritis, unspecified, with bleeding: Secondary | ICD-10-CM | POA: Diagnosis not present

## 2016-03-06 DIAGNOSIS — I2510 Atherosclerotic heart disease of native coronary artery without angina pectoris: Secondary | ICD-10-CM | POA: Diagnosis not present

## 2016-03-06 DIAGNOSIS — Z7982 Long term (current) use of aspirin: Secondary | ICD-10-CM | POA: Diagnosis not present

## 2016-03-06 DIAGNOSIS — I255 Ischemic cardiomyopathy: Secondary | ICD-10-CM | POA: Diagnosis not present

## 2016-03-06 DIAGNOSIS — K2971 Gastritis, unspecified, with bleeding: Secondary | ICD-10-CM | POA: Diagnosis not present

## 2016-03-06 DIAGNOSIS — Z720 Tobacco use: Secondary | ICD-10-CM | POA: Diagnosis not present

## 2016-03-06 DIAGNOSIS — I482 Chronic atrial fibrillation: Secondary | ICD-10-CM | POA: Diagnosis not present

## 2016-03-06 DIAGNOSIS — D500 Iron deficiency anemia secondary to blood loss (chronic): Secondary | ICD-10-CM | POA: Diagnosis not present

## 2016-03-06 DIAGNOSIS — I110 Hypertensive heart disease with heart failure: Secondary | ICD-10-CM | POA: Diagnosis not present

## 2016-03-06 DIAGNOSIS — I5022 Chronic systolic (congestive) heart failure: Secondary | ICD-10-CM | POA: Diagnosis not present

## 2016-03-08 DIAGNOSIS — I5022 Chronic systolic (congestive) heart failure: Secondary | ICD-10-CM | POA: Diagnosis not present

## 2016-03-08 DIAGNOSIS — I2510 Atherosclerotic heart disease of native coronary artery without angina pectoris: Secondary | ICD-10-CM | POA: Diagnosis not present

## 2016-03-08 DIAGNOSIS — Z720 Tobacco use: Secondary | ICD-10-CM | POA: Diagnosis not present

## 2016-03-08 DIAGNOSIS — I255 Ischemic cardiomyopathy: Secondary | ICD-10-CM | POA: Diagnosis not present

## 2016-03-08 DIAGNOSIS — I482 Chronic atrial fibrillation: Secondary | ICD-10-CM | POA: Diagnosis not present

## 2016-03-08 DIAGNOSIS — K2971 Gastritis, unspecified, with bleeding: Secondary | ICD-10-CM | POA: Diagnosis not present

## 2016-03-08 DIAGNOSIS — I110 Hypertensive heart disease with heart failure: Secondary | ICD-10-CM | POA: Diagnosis not present

## 2016-03-08 DIAGNOSIS — D500 Iron deficiency anemia secondary to blood loss (chronic): Secondary | ICD-10-CM | POA: Diagnosis not present

## 2016-03-08 DIAGNOSIS — Z7982 Long term (current) use of aspirin: Secondary | ICD-10-CM | POA: Diagnosis not present

## 2016-03-13 DIAGNOSIS — I255 Ischemic cardiomyopathy: Secondary | ICD-10-CM | POA: Diagnosis not present

## 2016-03-13 DIAGNOSIS — I2510 Atherosclerotic heart disease of native coronary artery without angina pectoris: Secondary | ICD-10-CM | POA: Diagnosis not present

## 2016-03-13 DIAGNOSIS — I10 Essential (primary) hypertension: Secondary | ICD-10-CM | POA: Diagnosis not present

## 2016-03-13 DIAGNOSIS — I25810 Atherosclerosis of coronary artery bypass graft(s) without angina pectoris: Secondary | ICD-10-CM | POA: Diagnosis not present

## 2016-03-13 DIAGNOSIS — I482 Chronic atrial fibrillation: Secondary | ICD-10-CM | POA: Diagnosis not present

## 2016-03-13 DIAGNOSIS — E782 Mixed hyperlipidemia: Secondary | ICD-10-CM | POA: Diagnosis not present

## 2016-03-14 ENCOUNTER — Ambulatory Visit: Payer: PPO | Attending: Internal Medicine | Admitting: Physical Therapy

## 2016-03-14 ENCOUNTER — Encounter: Payer: Self-pay | Admitting: Physical Therapy

## 2016-03-14 DIAGNOSIS — M21371 Foot drop, right foot: Secondary | ICD-10-CM | POA: Diagnosis not present

## 2016-03-14 DIAGNOSIS — Z9181 History of falling: Secondary | ICD-10-CM | POA: Diagnosis not present

## 2016-03-14 DIAGNOSIS — R262 Difficulty in walking, not elsewhere classified: Secondary | ICD-10-CM | POA: Diagnosis not present

## 2016-03-14 NOTE — Therapy (Signed)
[facility] [address]. [place], [place], [address] Phone: [phone]   Fax:  [phone]  Physical Therapy Evaluation  Patient Details  Name: Andres Gutierrez MRN: [id number] Date of Birth: [birth year] Referring Provider: Emily Filbert MD  Encounter Date: 03/14/2016      PT End of Session - 03/14/16 1701    Visit Number 1   Number of Visits 8   Date for PT Re-Evaluation 04/12/16   Authorization - Visit Number 1   Authorization - Number of Visits 10   PT Start Time 3419   PT Stop Time 1602   PT Time Calculation (min) 51 min   Equipment Utilized During Treatment Gait belt   Activity Tolerance Patient tolerated treatment well;No increased pain   Behavior During Therapy WFL for tasks assessed/performed      Past Medical History:  Diagnosis Date  . Anemia   . Anxiety   . Atrial fibrillation ([place])   . Cardiomyopathy ([place])   . CHF (congestive heart failure) ([place])   . Chronic kidney disease    kidney stones  . Coronary artery disease   . Cough   . Dysrhythmia   . Hypertension   . Lymphadenopathy, hilar   . MI (myocardial infarction)    x 2  . Wheezing     Past Surgical History:  Procedure Laterality Date  . BRONCHIAL NEEDLE ASPIRATION BIOPSY N/A 12/31/2014   Procedure: BRONCHIAL NEEDLE ASPIRATION BIOPSIES from carina;  Surgeon: Flora Lipps, MD;  Location: [facility] ORS;  Service: Cardiopulmonary;  Laterality: N/A;  . CARDIAC CATHETERIZATION N/A 12/28/2015   Procedure: Left Heart Cath and Cors/Grafts Angiography;  Surgeon: Yolonda Kida, MD;  Location: [place] [facility];  Service: Cardiovascular;  Laterality: N/A;  . CORONARY ANGIOPLASTY WITH STENT PLACEMENT    . CORONARY ARTERY BYPASS GRAFT    . ENDOBRONCHIAL ULTRASOUND N/A 12/31/2014   Procedure: ENDOBRONCHIAL ULTRASOUND;  Surgeon: Flora Lipps, MD;  Location: [facility] ORS;  Service: Cardiopulmonary;  Laterality: N/A;  . ESOPHAGOGASTRODUODENOSCOPY (EGD) WITH PROPOFOL N/A  06/20/2015   Procedure: ESOPHAGOGASTRODUODENOSCOPY (EGD) WITH PROPOFOL;  Surgeon: Lollie Sails, MD;  Location: [facility] ENDOSCOPY;  Service: Endoscopy;  Laterality: N/A;    There were no vitals filed for this visit.       Subjective Assessment - 03/14/16 1657    Subjective Pt states that in June of 2017 he started to have sx involving R leg weakness/R foot drop. He was diagnosed with spinal stenosis and was told that the nerve loss was permanent and he would no longer be able to actively raise his foot. Recommended use of AFO. Pt states that he was hospitalized for 10 days due to spinal stenosis sx and CHF. Pt states that he spent 40 days in a skilled care facility where he received physical therapy. He has now been at home a month. He walks with a RW at all times; also has access to a rollator and SPC. He is widowed as of 18 months ago and lives by himself but sees his daughter every day who lives close by. He states that prior to June of 2017 he was ambulating independently without need for assistive device, walking/driving normally. Pt would like to improve his walking/activity tolerance and improve balance to achieve highest level of function possible.    Patient is accompained by: Family member  daughter   Pertinent History R foot drop beginning in June of 2017 due to spinal stenosis. Hx of chronic foot ulceration,  R foot/leg cellulitis due to AFO use.   Limitations Walking;Standing;House hold activities   How long can you stand comfortably? current standing tolerance without support 7 minutes   Patient Stated Goals pt would like to improve his strength, endurance and balance so he can return to normal activity level   Currently in Pain? No/denies       Objective:  Neuromuscular Re-ed: In // bars with SPT CGA/SBA: static stance 1 minute with narrow base of support. 360 deg turn with no UE assist Rx2, Lx1. Tandem stance <2 secs standing tolerance before UE assist. SLS 4 sec LLE, <1 sec RLE.    Pt response for medical necessity: Pt demonstrates significant difficulty with balance tasks; increased difficulty with maintaining SLS for any period of time on RLE/LLE. Pt with notable fear/apprehension for performing tasks that require SLS due to fear of falling. Pt will benefit from skilled PT program to progress strength and balance to promote longterm mobility and decrease fall risk.       PT Education - 03/14/16 1700    Education provided Yes   Education Details See pt instructions for detailed HEP program. Encouraged use of RW for at home use to decrease fall risk.   Person(s) Educated Patient   Methods Explanation;Demonstration;Handout   Comprehension Verbalized understanding;Returned demonstration             PT Long Term Goals - 03/14/16 1717      PT LONG TERM GOAL #1   Title Pt will score >45/56 on BERG balance assessment to increase functional mobility and decrease fall risk   Baseline 11/1: 36/56   Time 4   Period Weeks   Status New     PT LONG TERM GOAL #2   Title Pt will complete LEFS outcome measure to assess current functional impairment with ADLs   Baseline 11/1: uncompleted   Time 4   Period Weeks   Status New     PT LONG TERM GOAL #3   Title Pt will increase LE strength 1/2 per MMT in hip flexion, knee extension, knee flexion to promote return to prior strength so that he can perform household duties independently   Baseline 11/1: MMT LE R/L hip flexion 4+/4+, knee extension 4+/4+, knee flexion 4/4+, dorsiflexion 0/3+, plantarflexion 4+/4+, hip abd/add not tested formally but [facility]   Time 4   Period Weeks   Status New     PT LONG TERM GOAL #4   Title Pt will perform sit<>stand with unilateral UE assist or hands on knees in clinic chair to progress toward previous level of function   Baseline 11/1: pt cannot stand from clinic chair without bilateral UE assist on arm rests   Time 4   Period Weeks   Status New            Plan - 03/14/16 1702     Clinical Impression Statement Pt is a pleasant 82 year old male referred to physical therapy for gait difficulty and balance issues secondary to onset of R foot drop in June of 2017. He presents for physical therapy evaluation ambulating with RW. Demonstrates consistent gait pattern with good clearance and heel strike bilaterally; pt with tendency to rely heavily on UE assist, pushing down through RW. He is responsive to cueing to decrease UE reliance but resorts to habit when not cued. Pt with mild forward flexed posture when using RW primarily due to tall stature (RW is max height). Pain: pt states that despite foot wounds/RLE edema  he experiences no pain. Skin integrity: pt with <1cm healing wound (at mid anterior shin due to AFO strap, <1cm wound at heel with surrounding callus/eschar with no drainage. Pt with <1 cm wound on dorsum of great toe with dried serous drainage (pt states he injured his foot on Saturday). Edema: 3+ on RLE just above proximal ankle. Pulses: pt with faint (1+) posterior tibial pulse; unable to assess dorsalis pedis pulse. Vitals: HR 80, O2 96, BP 131/75. Strength: per MMT LE R/L hip flexion 4+/4+, knee extension 4+/4+, knee flexion 4/4+, dorsiflexion 0/3+, plantarflexion 4+/4+, hip abd/add not tested formally but [facility]. Pt states intact to LT. ROM: pt with R ankle/toe PROM WFL but no active DF and minimal MTP flexion/extension. Outcome Measures: BERG balance 36/56 severe fall risk. Pt with notable difficulty performing sit<>stand without UE assist; he has developed heavy reliance on UE strength for stability/balance task and struggles with any task that involves dynamic/static balance with tandem/SLS. Pt will benefit from skilled physical therapy services to progress strengthening and balance program to help pt achieve highest level of function possible.    Rehab Potential Good   Clinical Impairments Affecting Rehab Potential Positive: family support, PLOF. Negative: dx, permanent  nerve damage   PT Frequency 2x / week   PT Duration 4 weeks   PT Treatment/Interventions ADLs/Self Care Home Management;Biofeedback;Cryotherapy;Electrical Stimulation;Gait training;Stair training;Functional mobility training;Therapeutic activities;Therapeutic exercise;Balance training;Neuromuscular re-education;Patient/family education;Orthotic Fit/Training;Manual techniques   PT Next Visit Plan progressive strengthening and balance program   PT Home Exercise Plan see pt instructions for detailed HEP   Consulted and Agree with Plan of Care Patient;Family member/caregiver  daughter      Patient will benefit from skilled therapeutic intervention in order to improve the following deficits and impairments:  Abnormal gait, Decreased activity tolerance, Decreased balance, Decreased coordination, Decreased endurance, Decreased knowledge of use of DME, Decreased mobility, Decreased skin integrity, Decreased strength, Difficulty walking, Impaired perceived functional ability, Postural dysfunction  Visit Diagnosis: Difficulty in walking, not elsewhere classified  History of falling  Foot drop, right      G-Codes - 2016-03-27 1546    Functional Assessment Tool Used Clinical impression/ gait difficulty/ Berg balance test/ muscle weakness   Functional Limitation Mobility: Walking and moving around   Mobility: Walking and Moving Around Current Status [phone]) At least 40 percent but less than 60 percent impaired, limited or restricted   Mobility: Walking and Moving Around Goal Status [phone]) At least 20 percent but less than 40 percent impaired, limited or restricted       Problem List Patient Active Problem List   Diagnosis Date Noted  . Chronic systolic heart failure ([place]) 01/17/2016  . Chewing tobacco use 01/17/2016  . Atrial fibrillation with RVR ([place]) 12/23/2015  . Dyspnea on exertion 12/23/2015  . Elevated troponin 12/23/2015  . GERD (gastroesophageal reflux disease) 12/23/2015  . Other  iron deficiency anemias 12/20/2015  . Femoral neck fracture, left, closed, initial encounter 11/03/2015  . Chronic atrial fibrillation ([place])   . Coronary artery disease involving native coronary artery of native heart without angina pectoris   . Acalculous cholecystitis 10/29/2015  . Adenopathy   . CAD (coronary artery disease) 12/22/2014  . HTN (hypertension) 12/22/2014   Pura Spice, PT, DPT # [id number] Derrill Memo, SPT 03/15/2016, 9:48 AM  [facility] [address] [place], [place], [address] Phone: [phone]   Fax:  [phone]  Name: [name] MRN: [id number] Date of Birth: Dec 20, 1930

## 2016-03-19 ENCOUNTER — Ambulatory Visit: Payer: PPO | Admitting: Physical Therapy

## 2016-03-19 ENCOUNTER — Encounter: Payer: Self-pay | Admitting: Physical Therapy

## 2016-03-19 DIAGNOSIS — R262 Difficulty in walking, not elsewhere classified: Secondary | ICD-10-CM

## 2016-03-19 DIAGNOSIS — M21371 Foot drop, right foot: Secondary | ICD-10-CM

## 2016-03-19 DIAGNOSIS — Z9181 History of falling: Secondary | ICD-10-CM

## 2016-03-19 NOTE — Therapy (Signed)
[facility] [address] [place], [place], [address] Phone: [phone]   Fax:  [phone]  Physical Therapy Treatment  Patient Details  Name: Andres Gutierrez MRN: [id number] Date of Birth: 1931-03-13 Referring Provider: Emily Filbert MD  Encounter Date: 03/19/2016      PT End of Session - 03/19/16 1849    Visit Number 2   Number of Visits 8   Date for PT Re-Evaluation 04/12/16   Authorization - Visit Number 2   Authorization - Number of Visits 10   PT Start Time 9924   PT Stop Time 1708   PT Time Calculation (min) 54 min   Equipment Utilized During Treatment Gait belt   Activity Tolerance Patient tolerated treatment well;No increased pain   Behavior During Therapy WFL for tasks assessed/performed      Past Medical History:  Diagnosis Date  . Anemia   . Anxiety   . Atrial fibrillation ([place])   . Cardiomyopathy ([place])   . CHF (congestive heart failure) ([place])   . Chronic kidney disease    kidney stones  . Coronary artery disease   . Cough   . Dysrhythmia   . Hypertension   . Lymphadenopathy, hilar   . MI (myocardial infarction)    x 2  . Wheezing     Past Surgical History:  Procedure Laterality Date  . BRONCHIAL NEEDLE ASPIRATION BIOPSY N/A 12/31/2014   Procedure: BRONCHIAL NEEDLE ASPIRATION BIOPSIES from carina;  Surgeon: Flora Lipps, MD;  Location: [facility] ORS;  Service: Cardiopulmonary;  Laterality: N/A;  . CARDIAC CATHETERIZATION N/A 12/28/2015   Procedure: Left Heart Cath and Cors/Grafts Angiography;  Surgeon: Yolonda Kida, MD;  Location: [place] CV LAB;  Service: Cardiovascular;  Laterality: N/A;  . CORONARY ANGIOPLASTY WITH STENT PLACEMENT    . CORONARY ARTERY BYPASS GRAFT    . ENDOBRONCHIAL ULTRASOUND N/A 12/31/2014   Procedure: ENDOBRONCHIAL ULTRASOUND;  Surgeon: Flora Lipps, MD;  Location: [facility] ORS;  Service: Cardiopulmonary;  Laterality: N/A;  . ESOPHAGOGASTRODUODENOSCOPY (EGD) WITH PROPOFOL N/A  06/20/2015   Procedure: ESOPHAGOGASTRODUODENOSCOPY (EGD) WITH PROPOFOL;  Surgeon: Lollie Sails, MD;  Location: [facility] ENDOSCOPY;  Service: Endoscopy;  Laterality: N/A;    There were no vitals filed for this visit.      Subjective Assessment - 03/19/16 1846    Subjective Pt states that he felt some muscle soreness this past weekend after performing his exercises; denies pain and states he is ready to work today. Pt states he has been trying to practice walking without his RW at his house. States that his children do not want him practicing walking without his RW when he is not supervised in PT clinic. Pt states that he continues to take care of his foot and it is looking better.   Patient is accompained by: Family member  daughter   Pertinent History R foot drop beginning in June of 2017 due to spinal stenosis. Hx of chronic foot ulceration, R foot/leg cellulitis due to AFO use.   Limitations Walking;Standing;House hold activities   How long can you stand comfortably? current standing tolerance without support 7 minutes   Patient Stated Goals pt would like to improve his strength, endurance and balance so he can return to normal activity level     Objective:  Therapeutic Exercise: Sci Fit Level 7 10 minutes (cool down/no charge). Supine hip abduction with manual resistance 5 sec holds x10 R/L. Supine hip abduction with blue theraband R/L x10. Hooklying bridge with  arms crossed and blue theraband at proximal knee for glute activation 2x10. SLR R/L x10 with cueing to maintain knee extension.   Neuromuscular Re-ed: In // bars with SPT CGA: Ambulation with no UE assist 89ft x5; pt with short step length and tendency for flat foot strike; very limited heel strike/toe off without UE support. Hip flexion in // bars with UE assist prn 41ft x6 (pt able to maintain very limited hip flexion without UE support; pt can achieve full range hip flexion with light touch unilateral UE assist). Partial tandem  walking and backward stepping 18ft x6 (pt unable to achieve full tandem without UE assist). Static balance on blue air ex pad with narrow base of support 30 sec x5, 10 sec x5 with eyes closed. Step up/down on blue air ex pad x20 (progressing from unilateral UE assist to no UE assist; pt demonstrates less reliance on UE assist with backwards progression). Partial lunge on air ex pad with no UE assist R/L 3x10 ea.   Pt response for medical necessity: Pt requires frequent cueing during therapy session to decrease speed of task and rely on LE strength rather than UE assist. Pt demonstrates initial difficulty for all balance tasks but shows good in session progress with ability to perform air ex step up with no UE support by end of session. Pt requires several seated rest breaks due to onset of muscle fatigue and shortness of breath (O2 100, HR 109).       PT Education - 03/19/16 1848    Education provided Yes   Education Details Hip abd with BTB in supine. Review of current HEP program: encouraged no UE support when performing bridge to isolate LE strength   Person(s) Educated Patient   Methods Explanation;Demonstration;Handout   Comprehension Verbalized understanding;Returned demonstration             PT Long Term Goals - 03/14/16 1717      PT LONG TERM GOAL #1   Title Pt will score >45/56 on BERG balance assessment to increase functional mobility and decrease fall risk   Baseline 11/1: 36/56   Time 4   Period Weeks   Status New     PT LONG TERM GOAL #2   Title Pt will complete LEFS outcome measure to assess current functional impairment with ADLs   Baseline 11/1: uncompleted   Time 4   Period Weeks   Status New     PT LONG TERM GOAL #3   Title Pt will increase LE strength 1/2 per MMT in hip flexion, knee extension, knee flexion to promote return to prior strength so that he can perform household duties independently   Baseline 11/1: MMT LE R/L hip flexion 4+/4+, knee extension  4+/4+, knee flexion 4/4+, dorsiflexion 0/3+, plantarflexion 4+/4+, hip abd/add not tested formally but [facility]   Time 4   Period Weeks   Status New     PT LONG TERM GOAL #4   Title Pt will perform sit<>stand with unilateral UE assist or hands on knees in clinic chair to progress toward previous level of function   Baseline 11/1: pt cannot stand from clinic chair without bilateral UE assist on arm rests   Time 4   Period Weeks   Status New            Plan - 03/19/16 1849    Clinical Impression Statement Pt performs all tasks while wearing AFO on RLE. He requires frequent cueing to decrease speed and rely on LE for  stability. Pt with initial difficulty performing all stepping tasks; demonstrates good in session progress and able to achieve step up on blue air ex pad with no UE support by end of session.    Rehab Potential Good   Clinical Impairments Affecting Rehab Potential Positive: family support, PLOF. Negative: dx, permanent nerve damage   PT Frequency 2x / week   PT Duration 4 weeks   PT Treatment/Interventions ADLs/Self Care Home Management;Biofeedback;Cryotherapy;Electrical Stimulation;Gait training;Stair training;Functional mobility training;Therapeutic activities;Therapeutic exercise;Balance training;Neuromuscular re-education;Patient/family education;Orthotic Fit/Training;Manual techniques   PT Next Visit Plan progressive strengthening and balance program   PT Home Exercise Plan see pt instructions for detailed HEP   Consulted and Agree with Plan of Care Patient;Family member/caregiver      Patient will benefit from skilled therapeutic intervention in order to improve the following deficits and impairments:  Abnormal gait, Decreased activity tolerance, Decreased balance, Decreased coordination, Decreased endurance, Decreased knowledge of use of DME, Decreased mobility, Decreased skin integrity, Decreased strength, Difficulty walking, Impaired perceived functional ability, Postural  dysfunction  Visit Diagnosis: Difficulty in walking, not elsewhere classified  History of falling  Foot drop, right     Problem List Patient Active Problem List   Diagnosis Date Noted  . Chronic systolic heart failure ([place]) 01/17/2016  . Chewing tobacco use 01/17/2016  . Atrial fibrillation with RVR ([place]) 12/23/2015  . Dyspnea on exertion 12/23/2015  . Elevated troponin 12/23/2015  . GERD (gastroesophageal reflux disease) 12/23/2015  . Other iron deficiency anemias 12/20/2015  . Femoral neck fracture, left, closed, initial encounter 11/03/2015  . Chronic atrial fibrillation ([place])   . Coronary artery disease involving native coronary artery of native heart without angina pectoris   . Acalculous cholecystitis 10/29/2015  . Adenopathy   . CAD (coronary artery disease) 12/22/2014  . HTN (hypertension) 12/22/2014   Pura Spice, PT, DPT # [id number] Mickel Baas Kathye Cipriani SPT 03/19/2016, 7:00 PM  [facility] [address] [place], [place], [address] Phone: [phone]   Fax:  [phone]  Name: Andres Gutierrez MRN: [id number] Date of Birth: 1931-01-10

## 2016-03-21 ENCOUNTER — Ambulatory Visit: Payer: PPO | Admitting: Physical Therapy

## 2016-03-21 DIAGNOSIS — M21371 Foot drop, right foot: Secondary | ICD-10-CM

## 2016-03-21 DIAGNOSIS — R262 Difficulty in walking, not elsewhere classified: Secondary | ICD-10-CM | POA: Diagnosis not present

## 2016-03-21 DIAGNOSIS — Z9181 History of falling: Secondary | ICD-10-CM

## 2016-03-21 NOTE — Therapy (Addendum)
[facility] [address] [place], [place], [address] Phone: [phone]   Fax:  [phone]  Physical Therapy Treatment  Patient Details  Name: Andres Gutierrez MRN: [id number] Date of Birth: May 15, 1930 Referring Provider: Emily Filbert MD  Encounter Date: 03/21/2016    Past Medical History:  Diagnosis Date  . Anemia   . Anxiety   . Atrial fibrillation ([place])   . Cardiomyopathy ([place])   . CHF (congestive heart failure) ([place])   . Chronic kidney disease    kidney stones  . Coronary artery disease   . Cough   . Dysrhythmia   . Hypertension   . Lymphadenopathy, hilar   . MI (myocardial infarction)    x 2  . Wheezing     Past Surgical History:  Procedure Laterality Date  . BRONCHIAL NEEDLE ASPIRATION BIOPSY N/A 12/31/2014   Procedure: BRONCHIAL NEEDLE ASPIRATION BIOPSIES from carina;  Surgeon: Flora Lipps, MD;  Location: [facility] ORS;  Service: Cardiopulmonary;  Laterality: N/A;  . CARDIAC CATHETERIZATION N/A 12/28/2015   Procedure: Left Heart Cath and Cors/Grafts Angiography;  Surgeon: Yolonda Kida, MD;  Location: [place] CV LAB;  Service: Cardiovascular;  Laterality: N/A;  . CORONARY ANGIOPLASTY WITH STENT PLACEMENT    . CORONARY ARTERY BYPASS GRAFT    . ENDOBRONCHIAL ULTRASOUND N/A 12/31/2014   Procedure: ENDOBRONCHIAL ULTRASOUND;  Surgeon: Flora Lipps, MD;  Location: [facility] ORS;  Service: Cardiopulmonary;  Laterality: N/A;  . ESOPHAGOGASTRODUODENOSCOPY (EGD) WITH PROPOFOL N/A 06/20/2015   Procedure: ESOPHAGOGASTRODUODENOSCOPY (EGD) WITH PROPOFOL;  Surgeon: Lollie Sails, MD;  Location: [facility] ENDOSCOPY;  Service: Endoscopy;  Laterality: N/A;    There were no vitals filed for this visit.     Pt. entered PT with use of AFO and RW for safety.  Pt. states blister/wound on heel is getting better.  PT smells a slight odor when pt. removed AFO for PT tx. session.      Objective:  Therapeutic Exercise: Sci Fit Level 7.5 10  minutes (warm-up/no charge). Supine hip abduction with manual resistance 5 sec holds x10 R/L. Supine hip abduction with blue theraband R/L x10. Hooklying bridge with arms crossed and blue theraband at proximal knee for glute activation 2x10. SLR R/L x10 with cueing to maintain knee extension.  Seated hip flexion/ LAQ/ heel raises with no AFO 20x each.  Discussed HEP.     Neuromuscular Re-ed: In // bars with SPT CGA: Ambulation (without AFO) with no UE assist 60ft x5; pt with short step length and tendency for flat foot strike; very limited heel strike/toe off without UE support. Hip flexion in // bars with UE assist prn 49ft x6 (pt able to maintain very limited hip flexion without UE support.  Partial/ modified tandem walking and backward stepping 69ft x6 (pt unable to achieve full tandem without UE assist).  Static balance on blue air ex pad with narrow base of support 30 sec x5, 10 sec x5 with eyes closed. Step up/down on blue air ex pad and 6" step x20 (progressing from unilateral UE assist to no UE assist; pt demonstrates less reliance on UE assist with backwards progression). Partial lunge on air ex pad with no UE assist R/L 3x10 ea.   Pt response for medical necessity: Pt requires frequent cueing during therapy session to decrease speed of task and rely on LE strength rather than UE assist. Pt demonstrates initial difficulty for all balance tasks but shows good in session progress with ability to perform air ex  step up with no UE support by end of session.      Pt. able to ambulate around PT clinic with no AFO and consistent hip/knee flexion with minimal R foot drop noted.  Pt. requires min. UE assist with standing hip ex./ R LE stance phase of gait with L LE swing through.   Pt. is very motivated and hard working during tx. session.  No LOB and benefits from sit to stands from elevated surface due to pts. height.  Pt. will continued to benefit from generalized LE strength/ balance training to  improve mobility.         PT Long Term Goals - 03/14/16 1717      PT LONG TERM GOAL #1   Title Pt will score >45/56 on BERG balance assessment to increase functional mobility and decrease fall risk   Baseline 11/1: 36/56   Time 4   Period Weeks   Status New     PT LONG TERM GOAL #2   Title Pt will complete LEFS outcome measure to assess current functional impairment with ADLs   Baseline 11/1: uncompleted   Time 4   Period Weeks   Status New     PT LONG TERM GOAL #3   Title Pt will increase LE strength 1/2 per MMT in hip flexion, knee extension, knee flexion to promote return to prior strength so that he can perform household duties independently   Baseline 11/1: MMT LE R/L hip flexion 4+/4+, knee extension 4+/4+, knee flexion 4/4+, dorsiflexion 0/3+, plantarflexion 4+/4+, hip abd/add not tested formally but [facility]   Time 4   Period Weeks   Status New     PT LONG TERM GOAL #4   Title Pt will perform sit<>stand with unilateral UE assist or hands on knees in clinic chair to progress toward previous level of function   Baseline 11/1: pt cannot stand from clinic chair without bilateral UE assist on arm rests   Time 4   Period Weeks   Status New             Patient will benefit from skilled therapeutic intervention in order to improve the following deficits and impairments:  Abnormal gait, Decreased activity tolerance, Decreased balance, Decreased coordination, Decreased endurance, Decreased knowledge of use of DME, Decreased mobility, Decreased skin integrity, Decreased strength, Difficulty walking, Impaired perceived functional ability, Postural dysfunction  Visit Diagnosis: Difficulty in walking, not elsewhere classified  History of falling  Foot drop, right     Problem List Patient Active Problem List   Diagnosis Date Noted  . Chronic systolic heart failure ([place]) 01/17/2016  . Chewing tobacco use 01/17/2016  . Atrial fibrillation with RVR ([place]) 12/23/2015  .  Dyspnea on exertion 12/23/2015  . Elevated troponin 12/23/2015  . GERD (gastroesophageal reflux disease) 12/23/2015  . Other iron deficiency anemias 12/20/2015  . Femoral neck fracture, left, closed, initial encounter 11/03/2015  . Chronic atrial fibrillation ([place])   . Coronary artery disease involving native coronary artery of native heart without angina pectoris   . Acalculous cholecystitis 10/29/2015  . Adenopathy   . CAD (coronary artery disease) 12/22/2014  . HTN (hypertension) 12/22/2014   Pura Spice, PT, DPT # [phone] 03/26/2016, 10:26 AM  [facility] [address] [place], [place], [address] Phone: [phone]   Fax:  [phone]  Name: Andres Gutierrez MRN: [id number] Date of Birth: Dec 06, 1930

## 2016-03-22 ENCOUNTER — Ambulatory Visit: Payer: PPO | Attending: Family | Admitting: Family

## 2016-03-22 ENCOUNTER — Encounter: Payer: Self-pay | Admitting: Family

## 2016-03-22 VITALS — BP 150/62 | HR 70 | Resp 18 | Ht 76.0 in | Wt 195.0 lb

## 2016-03-22 DIAGNOSIS — Z720 Tobacco use: Secondary | ICD-10-CM

## 2016-03-22 DIAGNOSIS — N189 Chronic kidney disease, unspecified: Secondary | ICD-10-CM | POA: Diagnosis not present

## 2016-03-22 DIAGNOSIS — I482 Chronic atrial fibrillation: Secondary | ICD-10-CM | POA: Insufficient documentation

## 2016-03-22 DIAGNOSIS — F17220 Nicotine dependence, chewing tobacco, uncomplicated: Secondary | ICD-10-CM | POA: Insufficient documentation

## 2016-03-22 DIAGNOSIS — Z888 Allergy status to other drugs, medicaments and biological substances status: Secondary | ICD-10-CM | POA: Diagnosis not present

## 2016-03-22 DIAGNOSIS — I252 Old myocardial infarction: Secondary | ICD-10-CM | POA: Diagnosis not present

## 2016-03-22 DIAGNOSIS — Z87442 Personal history of urinary calculi: Secondary | ICD-10-CM | POA: Diagnosis not present

## 2016-03-22 DIAGNOSIS — I4891 Unspecified atrial fibrillation: Secondary | ICD-10-CM

## 2016-03-22 DIAGNOSIS — I10 Essential (primary) hypertension: Secondary | ICD-10-CM

## 2016-03-22 DIAGNOSIS — Z79899 Other long term (current) drug therapy: Secondary | ICD-10-CM | POA: Diagnosis not present

## 2016-03-22 DIAGNOSIS — Z7982 Long term (current) use of aspirin: Secondary | ICD-10-CM | POA: Insufficient documentation

## 2016-03-22 DIAGNOSIS — I5022 Chronic systolic (congestive) heart failure: Secondary | ICD-10-CM | POA: Insufficient documentation

## 2016-03-22 DIAGNOSIS — I2510 Atherosclerotic heart disease of native coronary artery without angina pectoris: Secondary | ICD-10-CM | POA: Insufficient documentation

## 2016-03-22 DIAGNOSIS — I130 Hypertensive heart and chronic kidney disease with heart failure and stage 1 through stage 4 chronic kidney disease, or unspecified chronic kidney disease: Secondary | ICD-10-CM | POA: Insufficient documentation

## 2016-03-22 DIAGNOSIS — F419 Anxiety disorder, unspecified: Secondary | ICD-10-CM | POA: Insufficient documentation

## 2016-03-22 DIAGNOSIS — Z8249 Family history of ischemic heart disease and other diseases of the circulatory system: Secondary | ICD-10-CM | POA: Insufficient documentation

## 2016-03-22 NOTE — Progress Notes (Signed)
Patient ID: Andres Gutierrez, male    DOB: 1930-07-26, 82 y.o.   MRN: [id number]  HPI  Andres Gutierrez is a 82 y/o male with a history of MI, HTN, CAD, CKD, atrial fibrillation, anxiety, anemia, tobacco use and chronic heart failure.  Last echo was done 12/24/15 and showed an EF of 20-25% with trivial AR and mild Andres. Unchanged from previous echo done June 2017. Cardiac cath was done 12/28/15 Conclusion:   Successful cardiac catheter patent grafts with lesion to the insertion of the ramus   Recommend functional study prior to consideration for intervention   Aggressive medical therapy in the meanwhile.   Case discussed primary cardiology Dr. Ubaldo Glassing and interventionalists at [facility] Dr. Jerelene Redden as well as the patient and family  Was admitted to [facility] on 01/24/16 with anemia resulting in 1 unit of PRBC's given. Was cathed showing patent grafts and 75% lesion to the insertion of the ramus. Was discharged to [facility] and is now back at home receiving physical therapy at home.  Patient presents today for a follow-up visit with fatigue and shortness of breath upon exertion. Does have some swelling in his right lower leg which is chronic in nature and does not elevate his legs. Has been weighing himself daily at home and says that his home weight has been stable. Home PT released him and he's now doing outpatient physical therapy twice a week for now. Does wear a brace on his right lower leg due to spinal stenosis. Saw his cardiologist on 03/13/16.  Past Medical History:  Diagnosis Date  . Anemia   . Anxiety   . Atrial fibrillation ([place])   . Cardiomyopathy ([place])   . CHF (congestive heart failure) ([place])   . Chronic kidney disease    kidney stones  . Coronary artery disease   . Cough   . Dysrhythmia   . Hypertension   . Lymphadenopathy, hilar   . MI (myocardial infarction)    x 2  . Wheezing     Past Surgical History:  Procedure Laterality Date  . BRONCHIAL NEEDLE ASPIRATION BIOPSY N/A 12/31/2014   Procedure:  BRONCHIAL NEEDLE ASPIRATION BIOPSIES from carina;  Surgeon: Flora Lipps, MD;  Location: [facility] ORS;  Service: Cardiopulmonary;  Laterality: N/A;  . CARDIAC CATHETERIZATION N/A 12/28/2015   Procedure: Left Heart Cath and Cors/Grafts Angiography;  Surgeon: Yolonda Kida, MD;  Location: [facility] CV LAB;  Service: Cardiovascular;  Laterality: N/A;  . CORONARY ANGIOPLASTY WITH STENT PLACEMENT    . CORONARY ARTERY BYPASS GRAFT    . ENDOBRONCHIAL ULTRASOUND N/A 12/31/2014   Procedure: ENDOBRONCHIAL ULTRASOUND;  Surgeon: Flora Lipps, MD;  Location: [facility] ORS;  Service: Cardiopulmonary;  Laterality: N/A;  . ESOPHAGOGASTRODUODENOSCOPY (EGD) WITH PROPOFOL N/A 06/20/2015   Procedure: ESOPHAGOGASTRODUODENOSCOPY (EGD) WITH PROPOFOL;  Surgeon: Lollie Sails, MD;  Location: [facility] ENDOSCOPY;  Service: Endoscopy;  Laterality: N/A;    Family History  Problem Relation Age of Onset  . CAD Mother   . Colon cancer Father     Social History  Substance Use Topics  . Smoking status: Former Smoker    Quit date: 06/27/1977  . Smokeless tobacco: Current User    Types: Chew  . Alcohol use Yes     Comment: occational almost rare once a year    Allergies  Allergen Reactions  . 5ht3 Receptor Antagonists   . Diphenhydramine Hcl Other (See Comments)    Reaction:  Rash and fever a long time ago, but has had it since  with no problem.  . Escitalopram Other (See Comments)    Reaction:  Makes him feel faint, like he was going to have a heart attack.  . Maxidex [Dexamethasone] Other (See Comments)    Reaction:  Unknown   . Prednisone Other (See Comments)    Pt states that this medication made him feel crazy.    . Serotonin Other (See Comments)    Tried 2 different types, lexapro and another one.  Reaction:  Made him feel like he was having a heart attack.   . Vytorin [Ezetimibe-Simvastatin] Other (See Comments)    Reaction:  Unknown   . Zocor [Simvastatin] Other (See Comments)    Reaction:  Unknown      Prior to Admission medications   Medication Sig Start Date End Date Taking? Authorizing Provider  aspirin EC 81 MG tablet Take 81 mg by mouth daily.   Yes Historical Provider, MD  atorvastatin (LIPITOR) 40 MG tablet Take 1 tablet (40 mg total) by mouth daily at 6 PM. 12/30/15  Yes Dustin Flock, MD  digoxin (LANOXIN) 0.125 MG tablet Take 0.0625 mg by mouth daily.   Yes Historical Provider, MD  ferrous sulfate 325 (65 FE) MG tablet Take 1 tablet (325 mg total) by mouth daily. Patient taking differently: Take 325 mg by mouth every other day.  11/01/15  Yes Dustin Flock, MD  furosemide (LASIX) 20 MG tablet Take 20 mg by mouth as needed. For weight gain greater than 2 pounds in 24 hours or greater than 5 pounds in one week   Yes Historical Provider, MD  linaclotide (LINZESS) 72 MCG capsule Take 1 capsule (72 mcg total) by mouth daily before breakfast. 01/27/16  Yes Orson Eva, MD  metoprolol tartrate (LOPRESSOR) 12.5 mg TABS tablet Take 12.5 mg by mouth 2 (two) times daily.   Yes Historical Provider, MD  nitroGLYCERIN (NITROSTAT) 0.4 MG SL tablet Place 0.4 mg under the tongue every 5 (five) minutes as needed for chest pain.   Yes Historical Provider, MD  pantoprazole (PROTONIX) 40 MG tablet Take 40 mg by mouth 2 (two) times daily before a meal.    Yes Historical Provider, MD  potassium chloride (K-DUR) 10 MEQ tablet Take 10 mEq by mouth 2 (two) times daily.   Yes Historical Provider, MD  ranolazine (RANEXA) 500 MG 12 hr tablet Take 1 tablet (500 mg total) by mouth 2 (two) times daily. 12/30/15  Yes Dustin Flock, MD  senna (SENOKOT) 8.6 MG tablet Take 1 tablet by mouth at bedtime as needed for constipation.   Yes Historical Provider, MD  sucralfate (CARAFATE) 1 G tablet Take 1 g by mouth 2 (two) times daily.    Yes Historical Provider, MD  tamsulosin (FLOMAX) 0.4 MG CAPS capsule Take 0.4 mg by mouth daily after supper.    Yes Historical Provider, MD  vitamin C (VITAMIN C) 250 MG tablet Take 1  tablet (250 mg total) by mouth daily. 11/01/15  Yes Dustin Flock, MD    Review of Systems  Constitutional: Positive for fatigue. Negative for appetite change.  HENT: Negative for congestion, postnasal drip and sore throat.   Eyes: Negative.   Respiratory: Positive for shortness of breath. Negative for cough and chest tightness.   Cardiovascular: Positive for leg swelling (right ankle). Negative for chest pain and palpitations.  Gastrointestinal: Negative for abdominal distention and abdominal pain.  Endocrine: Negative.   Genitourinary: Negative.   Musculoskeletal: Negative for back pain and neck pain.  Skin: Negative.   Allergic/Immunologic: Negative.  Neurological: Negative for dizziness and light-headedness.  Hematological: Negative for adenopathy. Bruises/bleeds easily.  Psychiatric/Behavioral: Negative for dysphoric mood and sleep disturbance (sleeping on 1 pillow). The patient is not nervous/anxious.    Vitals:   03/22/16 0944  BP: (!) 150/62  Pulse: 70  Resp: 18  SpO2: 100%  Weight: 195 lb (88.5 kg)  Height: 6\' 4"  (1.93 m)    Wt Readings from Last 3 Encounters:  03/22/16 195 lb (88.5 kg)  02/23/16 187 lb (84.8 kg)  01/24/16 187 lb 9.8 oz (85.1 kg)    Lab Results  Component Value Date   CREATININE 1.09 01/26/2016   CREATININE 1.22 01/25/2016   CREATININE 1.27 (H) 01/24/2016    Physical Exam  Constitutional: He is oriented to person, place, and time. He appears well-developed and well-nourished.  HENT:  Head: Normocephalic and atraumatic.  Eyes: Conjunctivae are normal. Pupils are equal, round, and reactive to light.  Neck: Normal range of motion. Neck supple. No JVD present.  Cardiovascular: Normal rate and regular rhythm.   Pulmonary/Chest: Effort normal. He has no wheezes. He has no rales.  Abdominal: Soft. He exhibits no distension. There is no tenderness.  Musculoskeletal: He exhibits edema (trace edema around right ankle). He exhibits no tenderness.   Neurological: He is alert and oriented to person, place, and time.  Skin: Skin is warm and dry.  Psychiatric: He has a normal mood and affect. His behavior is normal. Thought content normal.  Nursing note and vitals reviewed.    Assessment & Plan:  1: Chronic heart failure with reduced ejection fraction- - NYHA Class II - euvolemic - continue weighing daily and take furosemide for an overnight weight gain of >2 pounds/weekly weight gain of >5 pounds.  - Elevate legs when sitting for long periods.  - Twice weekly outpatient physical therapy; walking with a walker - discussed entresto and brochure given to patient. He wants to discuss this with his PCP prior to beginning medication - Sees cardiologist ([facility]) 06/05/16  2: HTN- - BP looks good - Taking furosemide as needed  3: Chronic atrial fibrillation- - Not on eliquis due to history of GI bleed with recent admission for this September 2017. - No chest pain - Continues on metoprolol and baby aspirin - called cardiologist's office to clarify eliquis. Patient not taking it but cardiologist's plan in his note says to continue it. Message left with receptionist.   4: Chewing tobacco- - Continues to chew tobacco and not interested in quitting  Patient did not bring his medications nor a list. Each medication was verbally reviewed with the patient and he was encouraged to bring the bottles to every visit to confirm accuracy of list.  Return here in 2 months or sooner for any questions/problems before then

## 2016-03-22 NOTE — Patient Instructions (Signed)
Continue weighing daily and call for an overnight weight gain of > 2 pounds or a weekly weight gain of >5 pounds. 

## 2016-03-26 ENCOUNTER — Encounter: Payer: Self-pay | Admitting: Physical Therapy

## 2016-03-26 ENCOUNTER — Ambulatory Visit: Payer: PPO | Admitting: Physical Therapy

## 2016-03-26 DIAGNOSIS — R262 Difficulty in walking, not elsewhere classified: Secondary | ICD-10-CM

## 2016-03-26 DIAGNOSIS — Z9181 History of falling: Secondary | ICD-10-CM

## 2016-03-26 DIAGNOSIS — M21371 Foot drop, right foot: Secondary | ICD-10-CM

## 2016-03-26 NOTE — Therapy (Signed)
[facility] [address] [place], [place], [address] Phone: [phone]   Fax:  [phone]  Physical Therapy Treatment  Patient Details  Name: Andres Gutierrez MRN: [id number] Date of Birth: [birth year] Referring Provider: Emily Filbert MD  Encounter Date: 03/26/2016      PT End of Session - 03/26/16 1729    Visit Number 4   Number of Visits 8   Date for PT Re-Evaluation 04/12/16   Authorization - Visit Number 4   Authorization - Number of Visits 10   PT Start Time 1093   PT Stop Time 1712   PT Time Calculation (min) 59 min   Equipment Utilized During Treatment Gait belt   Activity Tolerance Patient tolerated treatment well;No increased pain   Behavior During Therapy WFL for tasks assessed/performed      Past Medical History:  Diagnosis Date  . Anemia   . Anxiety   . Atrial fibrillation ([place])   . Cardiomyopathy ([place])   . CHF (congestive heart failure) ([place])   . Chronic kidney disease    kidney stones  . Coronary artery disease   . Cough   . Dysrhythmia   . Hypertension   . Lymphadenopathy, hilar   . MI (myocardial infarction)    x 2  . Wheezing     Past Surgical History:  Procedure Laterality Date  . BRONCHIAL NEEDLE ASPIRATION BIOPSY N/A 12/31/2014   Procedure: BRONCHIAL NEEDLE ASPIRATION BIOPSIES from carina;  Surgeon: Flora Lipps, MD;  Location: [facility] ORS;  Service: Cardiopulmonary;  Laterality: N/A;  . CARDIAC CATHETERIZATION N/A 12/28/2015   Procedure: Left Heart Cath and Cors/Grafts Angiography;  Surgeon: Yolonda Kida, MD;  Location: [facility];  Service: Cardiovascular;  Laterality: N/A;  . CORONARY ANGIOPLASTY WITH STENT PLACEMENT    . CORONARY ARTERY BYPASS GRAFT    . ENDOBRONCHIAL ULTRASOUND N/A 12/31/2014   Procedure: ENDOBRONCHIAL ULTRASOUND;  Surgeon: Flora Lipps, MD;  Location: [facility] ORS;  Service: Cardiopulmonary;  Laterality: N/A;  . ESOPHAGOGASTRODUODENOSCOPY (EGD) WITH PROPOFOL N/A  06/20/2015   Procedure: ESOPHAGOGASTRODUODENOSCOPY (EGD) WITH PROPOFOL;  Surgeon: Lollie Sails, MD;  Location: [facility] ENDOSCOPY;  Service: Endoscopy;  Laterality: N/A;    There were no vitals filed for this visit.      Subjective Assessment - 03/26/16 1728    Subjective Pt states that he has been performing HEP at home. States that he walks for >5 minutes when ambulating to and from car/store. Reports that his foot/shin continue to heal.   Pertinent History R foot drop beginning in June of 2017 due to spinal stenosis. Hx of chronic foot ulceration, R foot/leg cellulitis due to AFO use.   Limitations Walking;Standing;House hold activities   How long can you stand comfortably? current standing tolerance without support 7 minutes   Patient Stated Goals pt would like to improve his strength, endurance and balance so he can return to normal activity level   Currently in Pain? No/denies     Objective:  Therapeutic Exercise: Seated hip abduction with RTB bilateral LE x20, RLE x20 with LLE resistance, LLE x20 with RLE resistance. Standing hip abduction with RTB at proximal knee x10 R/L; pt with tendency for moderate/severe trunk lean when performing exercise with UE assist; pt unable to achieve hip abduction >15 deg past neutral when exercise performed without UE assist. Ambulation for 5 minutes with RW; cueing for foot clearance, decreased reliance on UE support and pivoting strategy - emphasis on increasing endurance.  Neuromuscular  Re-ed: In // bars with SPT CGA/SBA: forward/backward walking with no UE assist 60ft x8. Lateral step in // bars with no UE assist 69ft x6. Anterior/posterior touch with RLE as stance and LLE as dynamic leg and vice versa x20 ea; pt requires UE assist prn especially with posterior touch. 3'' step touch R/L x20; pt with difficulty achieving 3'' step height without UE assist - moderate improvement in clearance with fingertip touch on // bars. Marching on air ex pad with  emphasis 1 minute x2. Static stance on air ex pad with narrow base of support and weighted ball AROM outside base of support 5 minutes. Obstacle course with 2 air ex pad step ups and 3'' step up; pt with multiple LOB during obstacle course navigation; moderate-max cueing to decrease pace and focus on stability rather than rushing through exercise.  Pt response for medical necessity: Pt demonstrates most difficulty with single leg stance on RLE and bilateral hip abduction strength/endurance this session. He is eager to complete all tasks and requires moderate cueing to slow pace and focus on stability/safety.       PT Long Term Goals - 03/14/16 1717      PT LONG TERM GOAL #1   Title Pt will score >45/56 on BERG balance assessment to increase functional mobility and decrease fall risk   Baseline 11/1: 36/56   Time 4   Period Weeks   Status New     PT LONG TERM GOAL #2   Title Pt will complete LEFS outcome measure to assess current functional impairment with ADLs   Baseline 11/1: uncompleted   Time 4   Period Weeks   Status New     PT LONG TERM GOAL #3   Title Pt will increase LE strength 1/2 per MMT in hip flexion, knee extension, knee flexion to promote return to prior strength so that he can perform household duties independently   Baseline 11/1: MMT LE R/L hip flexion 4+/4+, knee extension 4+/4+, knee flexion 4/4+, dorsiflexion 0/3+, plantarflexion 4+/4+, hip abd/add not tested formally but [facility]   Time 4   Period Weeks   Status New     PT LONG TERM GOAL #4   Title Pt will perform sit<>stand with unilateral UE assist or hands on knees in clinic chair to progress toward previous level of function   Baseline 11/1: pt cannot stand from clinic chair without bilateral UE assist on arm rests   Time 4   Period Weeks   Status New               Plan - 03/26/16 1730    Clinical Impression Statement Pt able to tolerate decreased stance on RLE (as compared to LLE) during dynamic  tasks in // bars. Tendency for noticeable hip hike as he fatigues; moderate weakness of hip abductors noted. Pt with tendency for trunk compensation when performing resisted standing abduction; difficulty maintaing neutral trunk with verbal/tactile cueing. Pt with consistent foot clearance during session; AFO doffed for last 1/2 of session.    Rehab Potential Good   Clinical Impairments Affecting Rehab Potential Positive: family support, PLOF. Negative: dx, permanent nerve damage   PT Frequency 2x / week   PT Duration 4 weeks   PT Treatment/Interventions ADLs/Self Care Home Management;Biofeedback;Cryotherapy;Electrical Stimulation;Gait training;Stair training;Functional mobility training;Therapeutic activities;Therapeutic exercise;Balance training;Neuromuscular re-education;Patient/family education;Orthotic Fit/Training;Manual techniques   PT Next Visit Plan progressive strengthening and balance program   PT Home Exercise Plan see pt instructions for detailed HEP   Consulted and  Agree with Plan of Care Patient;Family member/caregiver      Patient will benefit from skilled therapeutic intervention in order to improve the following deficits and impairments:  Abnormal gait, Decreased activity tolerance, Decreased balance, Decreased coordination, Decreased endurance, Decreased knowledge of use of DME, Decreased mobility, Decreased skin integrity, Decreased strength, Difficulty walking, Impaired perceived functional ability, Postural dysfunction  Visit Diagnosis: Difficulty in walking, not elsewhere classified  History of falling  Foot drop, right     Problem List Patient Active Problem List   Diagnosis Date Noted  . Chronic systolic heart failure ([place]) 01/17/2016  . Chewing tobacco use 01/17/2016  . Atrial fibrillation with RVR ([place]) 12/23/2015  . Dyspnea on exertion 12/23/2015  . Elevated troponin 12/23/2015  . GERD (gastroesophageal reflux disease) 12/23/2015  . Other iron deficiency  anemias 12/20/2015  . Femoral neck fracture, left, closed, initial encounter 11/03/2015  . Chronic atrial fibrillation ([place])   . Coronary artery disease involving native coronary artery of native heart without angina pectoris   . Acalculous cholecystitis 10/29/2015  . Adenopathy   . CAD (coronary artery disease) 12/22/2014  . HTN (hypertension) 12/22/2014   Pura Spice, PT, DPT # [id number] Mickel Baas Tonda Wiederhold SPT 03/26/2016, 5:39 PM  [facility] [address] [place], [place], [address] Phone: [phone]   Fax:  [phone]  Name: Andres Gutierrez MRN: [id number] Date of Birth: 04/21/1931

## 2016-03-28 ENCOUNTER — Ambulatory Visit: Payer: PPO | Admitting: Physical Therapy

## 2016-03-28 ENCOUNTER — Encounter: Payer: Self-pay | Admitting: Physical Therapy

## 2016-03-28 DIAGNOSIS — R262 Difficulty in walking, not elsewhere classified: Secondary | ICD-10-CM

## 2016-03-28 DIAGNOSIS — M21371 Foot drop, right foot: Secondary | ICD-10-CM

## 2016-03-28 DIAGNOSIS — Z9181 History of falling: Secondary | ICD-10-CM

## 2016-03-28 NOTE — Therapy (Signed)
[facility] [address] [place], [place], [address] Phone: [phone]   Fax:  [phone]  Physical Therapy Treatment  Patient Details  Name: Andres Gutierrez MRN: [id number] Date of Birth: 1930-08-28 Referring Provider: Emily Filbert MD  Encounter Date: 03/28/2016      PT End of Session - 03/28/16 1757    Visit Number 5   Number of Visits 8   Date for PT Re-Evaluation 04/12/16   Authorization - Visit Number 5   Authorization - Number of Visits 10   PT Start Time 2297   PT Stop Time 1711   PT Time Calculation (min) 58 min   Equipment Utilized During Treatment Gait belt   Activity Tolerance Patient tolerated treatment well;No increased pain   Behavior During Therapy WFL for tasks assessed/performed      Past Medical History:  Diagnosis Date  . Anemia   . Anxiety   . Atrial fibrillation ([place])   . Cardiomyopathy ([place])   . CHF (congestive heart failure) ([place])   . Chronic kidney disease    kidney stones  . Coronary artery disease   . Cough   . Dysrhythmia   . Hypertension   . Lymphadenopathy, hilar   . MI (myocardial infarction)    x 2  . Wheezing     Past Surgical History:  Procedure Laterality Date  . BRONCHIAL NEEDLE ASPIRATION BIOPSY N/A 12/31/2014   Procedure: BRONCHIAL NEEDLE ASPIRATION BIOPSIES from carina;  Surgeon: Flora Lipps, MD;  Location: [facility] ORS;  Service: Cardiopulmonary;  Laterality: N/A;  . CARDIAC CATHETERIZATION N/A 12/28/2015   Procedure: Left Heart Cath and Cors/Grafts Angiography;  Surgeon: Yolonda Kida, MD;  Location: [facility];  Service: Cardiovascular;  Laterality: N/A;  . CORONARY ANGIOPLASTY WITH STENT PLACEMENT    . CORONARY ARTERY BYPASS GRAFT    . ENDOBRONCHIAL ULTRASOUND N/A 12/31/2014   Procedure: ENDOBRONCHIAL ULTRASOUND;  Surgeon: Flora Lipps, MD;  Location: [facility] ORS;  Service: Cardiopulmonary;  Laterality: N/A;  . ESOPHAGOGASTRODUODENOSCOPY (EGD) WITH PROPOFOL N/A  06/20/2015   Procedure: ESOPHAGOGASTRODUODENOSCOPY (EGD) WITH PROPOFOL;  Surgeon: Lollie Sails, MD;  Location: [facility] ENDOSCOPY;  Service: Endoscopy;  Laterality: N/A;    There were no vitals filed for this visit.      Subjective Assessment - 03/28/16 1756    Subjective Pt states that he has been doing well since last visit. No new c/o. No reports of falls/stumbles. Continues to use RW for ambulation but complete car transfers indep.   Patient is accompained by: Family member   Pertinent History R foot drop beginning in June of 2017 due to spinal stenosis. Hx of chronic foot ulceration, R foot/leg cellulitis due to AFO use.   Limitations Walking;Standing;House hold activities   How long can you stand comfortably? current standing tolerance without support 7 minutes   Patient Stated Goals pt would like to improve his strength, endurance and balance so he can return to normal activity level   Currently in Pain? No/denies     Objective:  Therapeutic Exercise: SciFit Level 7.5 (no charge). Hooklying bridge with YTB at proximal knee and arms crossed behind head 2x10. Sidelying clamshell with YTB L side x5; pt with poor understanding of exercise set up; unable to achieve without excess trunk lean. Supine hip abduction AROM on mat table 2x10 RLE, 1x10 LLE. Supine hip abduction with YTB at ankle RLE as stability limb, LLE as active limb. Supine hip abduction resisted isometric 10 sec holds with  resistance at proximal knee x10 RLE.  Neuromuscular Re-ed: In // bars with SPT CGA: ambulation with no UE support 11ft x6; cueing for heel strike. Air ex step up/down x20 with UE assist prn; pt with multiple LOB requiring min A assist. Static balance in lunge position 30 sec x4 with RLE posterior/LLE posterior. Lunge with forward lean x10 R/L. Mini squat with blocking of R knee x20 - tactile cueing to prevent pt from excessive knee flexion when performing squat.  Pt response for medical necessity: Pt  demonstrates good motivation for all tasks this session. He demonstrates most difficulty with stepping tasks with heavy reliance on trunk lean for R clearance as he fatigues. Pt with considerable weakness of R hip abductors evident with fatigued gait and during resisted hip abd therapeutic exercise. Pt will benefit from physical therapy services to improve overall balance and LE strength with emphasis on hip stability.       PT Long Term Goals - 03/14/16 1717      PT LONG TERM GOAL #1   Title Pt will score >45/56 on BERG balance assessment to increase functional mobility and decrease fall risk   Baseline 11/1: 36/56   Time 4   Period Weeks   Status New     PT LONG TERM GOAL #2   Title Pt will complete LEFS outcome measure to assess current functional impairment with ADLs   Baseline 11/1: uncompleted   Time 4   Period Weeks   Status New     PT LONG TERM GOAL #3   Title Pt will increase LE strength 1/2 per MMT in hip flexion, knee extension, knee flexion to promote return to prior strength so that he can perform household duties independently   Baseline 11/1: MMT LE R/L hip flexion 4+/4+, knee extension 4+/4+, knee flexion 4/4+, dorsiflexion 0/3+, plantarflexion 4+/4+, hip abd/add not tested formally but [facility]   Time 4   Period Weeks   Status New     PT LONG TERM GOAL #4   Title Pt will perform sit<>stand with unilateral UE assist or hands on knees in clinic chair to progress toward previous level of function   Baseline 11/1: pt cannot stand from clinic chair without bilateral UE assist on arm rests   Time 4   Period Weeks   Status New               Plan - 03/28/16 1758    Clinical Impression Statement Pt demonstrates difficulty performing stepping tasks with AFO doffed; with reliance on max hip/knee flexion and trunk lean for clearance. Pt with better stability and consistent foot clearance with AFO. Pt with difficulty performing step up/down tasks on blue air ex pad this  session due to poor single leg stance stability. Benefits from static balance with tandem/partial tandem to progress balance.    Rehab Potential Good   Clinical Impairments Affecting Rehab Potential Positive: family support, PLOF. Negative: dx, permanent nerve damage   PT Frequency 2x / week   PT Duration 4 weeks   PT Treatment/Interventions ADLs/Self Care Home Management;Biofeedback;Cryotherapy;Electrical Stimulation;Gait training;Stair training;Functional mobility training;Therapeutic activities;Therapeutic exercise;Balance training;Neuromuscular re-education;Patient/family education;Orthotic Fit/Training;Manual techniques   PT Next Visit Plan Static/dynamic balance beginning at partial tandem. Stepping tasks. Progressive strengthening and balance program.   PT Home Exercise Plan see pt instructions for detailed HEP   Consulted and Agree with Plan of Care Patient;Family member/caregiver      Patient will benefit from skilled therapeutic intervention in order to improve the following  deficits and impairments:  Abnormal gait, Decreased activity tolerance, Decreased balance, Decreased coordination, Decreased endurance, Decreased knowledge of use of DME, Decreased mobility, Decreased skin integrity, Decreased strength, Difficulty walking, Impaired perceived functional ability, Postural dysfunction  Visit Diagnosis: Difficulty in walking, not elsewhere classified  History of falling  Foot drop, right     Problem List Patient Active Problem List   Diagnosis Date Noted  . Chronic systolic heart failure ([place]) 01/17/2016  . Chewing tobacco use 01/17/2016  . Atrial fibrillation with RVR ([place]) 12/23/2015  . Dyspnea on exertion 12/23/2015  . Elevated troponin 12/23/2015  . GERD (gastroesophageal reflux disease) 12/23/2015  . Other iron deficiency anemias 12/20/2015  . Femoral neck fracture, left, closed, initial encounter 11/03/2015  . Chronic atrial fibrillation ([place])   . Coronary artery  disease involving native coronary artery of native heart without angina pectoris   . Acalculous cholecystitis 10/29/2015  . Adenopathy   . CAD (coronary artery disease) 12/22/2014  . HTN (hypertension) 12/22/2014   Pura Spice, PT, DPT # [phone] Derrill Memo, SPT 03/29/2016, 12:28 PM  [place] [address] [place], [place], [address] Phone: [phone]   Fax:  [phone]  Name: Andres Gutierrez MRN: [id number] Date of Birth: [birth year]

## 2016-04-02 ENCOUNTER — Ambulatory Visit: Payer: PPO

## 2016-04-02 VITALS — BP 145/81 | HR 88

## 2016-04-02 DIAGNOSIS — Z9181 History of falling: Secondary | ICD-10-CM

## 2016-04-02 DIAGNOSIS — R262 Difficulty in walking, not elsewhere classified: Secondary | ICD-10-CM

## 2016-04-02 NOTE — Therapy (Signed)
[place] [facility] [address] [place], [place], [address] Phone: [phone]   Fax:  [phone]  Physical Therapy Treatment  Patient Details  Name: Andres Gutierrez MRN: [id number] Date of Birth: 11/03/1930 Referring Provider: Emily Filbert MD  Encounter Date: 04/02/2016      PT End of Session - 04/02/16 0814    Visit Number 6   Number of Visits 8   Date for PT Re-Evaluation 04/12/16   Authorization - Visit Number 6   Authorization - Number of Visits 10   PT Start Time 0810   PT Stop Time 0900   PT Time Calculation (min) 50 min   Equipment Utilized During Treatment Gait belt   Activity Tolerance Patient tolerated treatment well;No increased pain   Behavior During Therapy WFL for tasks assessed/performed      Past Medical History:  Diagnosis Date  . Anemia   . Anxiety   . Atrial fibrillation ([place])   . Cardiomyopathy ([place])   . CHF (congestive heart failure) ([place])   . Chronic kidney disease    kidney stones  . Coronary artery disease   . Cough   . Dysrhythmia   . Hypertension   . Lymphadenopathy, hilar   . MI (myocardial infarction)    x 2  . Wheezing     Past Surgical History:  Procedure Laterality Date  . BRONCHIAL NEEDLE ASPIRATION BIOPSY N/A 12/31/2014   Procedure: BRONCHIAL NEEDLE ASPIRATION BIOPSIES from carina;  Surgeon: Flora Lipps, MD;  Location: [facility] ORS;  Service: Cardiopulmonary;  Laterality: N/A;  . CARDIAC CATHETERIZATION N/A 12/28/2015   Procedure: Left Heart Cath and Cors/Grafts Angiography;  Surgeon: Yolonda Kida, MD;  Location: [place] [facility];  Service: Cardiovascular;  Laterality: N/A;  . CORONARY ANGIOPLASTY WITH STENT PLACEMENT    . CORONARY ARTERY BYPASS GRAFT    . ENDOBRONCHIAL ULTRASOUND N/A 12/31/2014   Procedure: ENDOBRONCHIAL ULTRASOUND;  Surgeon: Flora Lipps, MD;  Location: [facility] ORS;  Service: Cardiopulmonary;  Laterality: N/A;  . ESOPHAGOGASTRODUODENOSCOPY (EGD) WITH PROPOFOL N/A  06/20/2015   Procedure: ESOPHAGOGASTRODUODENOSCOPY (EGD) WITH PROPOFOL;  Surgeon: Lollie Sails, MD;  Location: [facility] ENDOSCOPY;  Service: Endoscopy;  Laterality: N/A;    Vitals:   04/02/16 0810  BP: (!) 145/81  Pulse: 88  SpO2: 97%        Subjective Assessment - 04/02/16 0812    Subjective Pt reports that he is doing well on this date. He is performing HEP and states that he is primarily focusing on two of the exercises that he has. No recent changes in health. No pain reported today. Pt would like to know if his RLE weakness could possibly be Parkinson's Disease.    Patient is accompained by: Family member   Pertinent History R foot drop beginning in June of 2017 due to spinal stenosis. Hx of chronic foot ulceration, R foot/leg cellulitis due to AFO use.   Limitations Walking;Standing;House hold activities   How long can you stand comfortably? current standing tolerance without support 7 minutes   Patient Stated Goals pt would like to improve his strength, endurance and balance so he can return to normal activity level   Currently in Pain? No/denies      Objective:  Therapeutic Exercise:  SciFit Level 7.5 x 5 minutes (no charge).  Hooklying bridge with YTB at proximal knee and arms crossed behind head 2x10.  Hooklying clamshell with YTB 2 x 10, added additional manual resistance; Sit to stand without UE support  from mat table x 10, with Airex under feet to challenge balance x 10, minA+1 required for balance with Airex pad, fatigue noted after 6 repetitions; Forward lunge onto BOSU leading with RLE 2 x 10;  Side lunges onto BOSU leading with RLE 2 x 10; Standing hip abduction with YTB at ankle  2x 10 bilateral;'  Neuromuscular Re-ed: In // bars with CGA/minA Semitandem balance alternating LE forward x multiple bouts; Semitandem balance alternating LE forward with horizontal and vertical head turns multiple bouts;  Pt response for medical necessity: Pt demonstrates good  motivation for all tasks this session. He demonstrates most difficulty with stepping tasks and balance in single leg stance. Pt with considerable weakness of R hip abductors evident with fatigued gait and during resisted hip abd therapeutic exercise. Pt will benefit from physical therapy services to improve overall balance and LE strength with emphasis on hip stability.                             PT Education - 04/02/16 0813    Education provided Yes   Education Details Reinforced importance of HEP. Cues for proper form with exercise   Person(s) Educated Patient   Methods Explanation   Comprehension Verbalized understanding             PT Long Term Goals - 03/14/16 1717      PT LONG TERM GOAL #1   Title Pt will score >45/56 on BERG balance assessment to increase functional mobility and decrease fall risk   Baseline 11/1: 36/56   Time 4   Period Weeks   Status New     PT LONG TERM GOAL #2   Title Pt will complete LEFS outcome measure to assess current functional impairment with ADLs   Baseline 11/1: uncompleted   Time 4   Period Weeks   Status New     PT LONG TERM GOAL #3   Title Pt will increase LE strength 1/2 per MMT in hip flexion, knee extension, knee flexion to promote return to prior strength so that he can perform household duties independently   Baseline 11/1: MMT LE R/L hip flexion 4+/4+, knee extension 4+/4+, knee flexion 4/4+, dorsiflexion 0/3+, plantarflexion 4+/4+, hip abd/add not tested formally but [facility]   Time 4   Period Weeks   Status New     PT LONG TERM GOAL #4   Title Pt will perform sit<>stand with unilateral UE assist or hands on knees in clinic chair to progress toward previous level of function   Baseline 11/1: pt cannot stand from clinic chair without bilateral UE assist on arm rests   Time [place] - 04/02/16 8250    Clinical Impression Statement Pt continues to  demonstrate deficits with balance during session today. AFO donned for entire session. Struggles with all single leg stance and lunging activities. Explained to patient that Parkinson's disease doesn't typically manifest with unilateral LE weakness but if he is having other symptoms he should follow-up with his PCP. Pt encouraged to continue HEP and follow-up as scheduled for therapy. Pt will continue to benefit from skilled PT services to address deficits in strength and balance.    Rehab Potential Good   Clinical Impairments Affecting Rehab Potential Positive: family support, PLOF. Negative: dx, permanent nerve damage   PT Frequency 2x /  week   PT Duration 4 weeks   PT Treatment/Interventions ADLs/Self Care Home Management;Biofeedback;Cryotherapy;Electrical Stimulation;Gait training;Stair training;Functional mobility training;Therapeutic activities;Therapeutic exercise;Balance training;Neuromuscular re-education;Patient/family education;Orthotic Fit/Training;Manual techniques   PT Next Visit Plan Static/dynamic balance beginning at partial tandem. Stepping tasks. Progressive strengthening and balance program.   PT Home Exercise Plan see pt instructions for detailed HEP. Continue as prescribed   Consulted and Agree with Plan of Care Patient;Family member/caregiver      Patient will benefit from skilled therapeutic intervention in order to improve the following deficits and impairments:  Abnormal gait, Decreased activity tolerance, Decreased balance, Decreased coordination, Decreased endurance, Decreased knowledge of use of DME, Decreased mobility, Decreased skin integrity, Decreased strength, Difficulty walking, Impaired perceived functional ability, Postural dysfunction  Visit Diagnosis: Difficulty in walking, not elsewhere classified  History of falling     Problem List Patient Active Problem List   Diagnosis Date Noted  . Chronic systolic heart failure ([place]) 01/17/2016  . Chewing  tobacco use 01/17/2016  . Atrial fibrillation with RVR ([place]) 12/23/2015  . Dyspnea on exertion 12/23/2015  . Elevated troponin 12/23/2015  . GERD (gastroesophageal reflux disease) 12/23/2015  . Other iron deficiency anemias 12/20/2015  . Femoral neck fracture, left, closed, initial encounter 11/03/2015  . Chronic atrial fibrillation ([place])   . Coronary artery disease involving native coronary artery of native heart without angina pectoris   . Acalculous cholecystitis 10/29/2015  . Adenopathy   . CAD (coronary artery disease) 12/22/2014  . HTN (hypertension) 12/22/2014   Phillips Grout PT, DPT   Khori Rosevear 04/02/2016, 12:57 PM  [place] [facility] [address] [place], [place], [address] Phone: [phone]   Fax:  [phone]  Name: KYM FENTER MRN: [id number] Date of Birth: [birth year]

## 2016-04-04 ENCOUNTER — Ambulatory Visit: Payer: PPO

## 2016-04-04 ENCOUNTER — Encounter: Payer: PPO | Admitting: Physical Therapy

## 2016-04-04 ENCOUNTER — Encounter: Payer: Self-pay | Admitting: Physical Therapy

## 2016-04-04 VITALS — BP 152/69 | HR 74

## 2016-04-04 DIAGNOSIS — Z9181 History of falling: Secondary | ICD-10-CM

## 2016-04-04 DIAGNOSIS — R262 Difficulty in walking, not elsewhere classified: Secondary | ICD-10-CM

## 2016-04-04 NOTE — Therapy (Signed)
[place] [facility] [address] [place] [address] [place], [place], [address] Phone: [phone]   Fax:  [phone]  Physical Therapy Treatment  Patient Details  Name: Andres Gutierrez MRN: [id number] Date of Birth: May 08, 1931 Referring Provider: Emily Filbert MD  Encounter Date: 04/04/2016      PT End of Session - 04/04/16 1251    Visit Number 7   Number of Visits 8   Date for PT Re-Evaluation 04/12/16   Authorization - Visit Number 7   Authorization - Number of Visits 10   PT Start Time 7654   PT Stop Time 1115   PT Time Calculation (min) 45 min   Equipment Utilized During Treatment Gait belt   Activity Tolerance Patient tolerated treatment well;No increased pain   Behavior During Therapy WFL for tasks assessed/performed      Past Medical History:  Diagnosis Date  . Anemia   . Anxiety   . Atrial fibrillation ([place])   . Cardiomyopathy ([place])   . CHF (congestive heart failure) ([place])   . Chronic kidney disease    kidney stones  . Coronary artery disease   . Cough   . Dysrhythmia   . Hypertension   . Lymphadenopathy, hilar   . MI (myocardial infarction)    x 2  . Wheezing     Past Surgical History:  Procedure Laterality Date  . BRONCHIAL NEEDLE ASPIRATION BIOPSY N/A 12/31/2014   Procedure: BRONCHIAL NEEDLE ASPIRATION BIOPSIES from carina;  Surgeon: Flora Lipps, MD;  Location: [facility] ORS;  Service: Cardiopulmonary;  Laterality: N/A;  . CARDIAC CATHETERIZATION N/A 12/28/2015   Procedure: Left Heart Cath and Cors/Grafts Angiography;  Surgeon: Yolonda Kida, MD;  Location: [place] [facility];  Service: Cardiovascular;  Laterality: N/A;  . CORONARY ANGIOPLASTY WITH STENT PLACEMENT    . CORONARY ARTERY BYPASS GRAFT    . ENDOBRONCHIAL ULTRASOUND N/A 12/31/2014   Procedure: ENDOBRONCHIAL ULTRASOUND;  Surgeon: Flora Lipps, MD;  Location: [facility] ORS;  Service: Cardiopulmonary;  Laterality: N/A;  . ESOPHAGOGASTRODUODENOSCOPY (EGD) WITH PROPOFOL N/A  06/20/2015   Procedure: ESOPHAGOGASTRODUODENOSCOPY (EGD) WITH PROPOFOL;  Surgeon: Lollie Sails, MD;  Location: [facility] ENDOSCOPY;  Service: Endoscopy;  Laterality: N/A;    Vitals:   04/04/16 1025  BP: (!) 152/69  Pulse: 74  SpO2: 99%        Subjective Assessment - 04/04/16 1024    Subjective Pt reports that he is doing well on this date. No specific questions or concerns. He was able to perform HEP yesterday. Pt denies pain currently.    Patient is accompained by: Family member   Pertinent History R foot drop beginning in June of 2017 due to spinal stenosis. Hx of chronic foot ulceration, R foot/leg cellulitis due to AFO use.   Limitations Walking;Standing;House hold activities   How long can you stand comfortably? current standing tolerance without support 7 minutes   Patient Stated Goals pt would like to improve his strength, endurance and balance so he can return to normal activity level   Currently in Pain? No/denies        Objective:  Therapeutic Exercise:  SciFit Level 7.5 x 7 minutes (no charge).  TG squats 2 x 10, attempted R single leg but unable to perform; Hooklying bridge with YTB at proximal knee and arms crossed behind head 2x10, attempted R single leg but unable to perform; Hooklying clamshell with manual resistance 2 x 10, Hooklying adduction with manual resistance 2 x 10; Resisted knee flops for hip  rotation and oblique activation 2 x 10 in each direction; Sit to stand without UE support from chair with minimal assistance from therapist 2 x 5, increased difficulty today due to low chair;  Neuromuscular Re-ed: Toe taps to 6" step alternating LE x 10, x 7 (second set stopped due to severe RLE buckling; Side stepping in // bars x 4 lengths; Attempted braiding with UE support but even with heavy verbal/tactile cues and demonstration pt unable to sequence correctly.  Airex static balance with feet apart x 30 seconds; Airex marches x 30 seconds without UE  support; Airex static balance with feet apart and eyes closed x 30 seconds; Semitandem balance alternating LE forward x multiple bouts; Semitandem balance alternating LE forward with horizontal head turns multiple bouts;  Pt response for medical necessity: Pt demonstrates good motivation for all tasks this session. He demonstrates most difficulty with stepping tasks and balance in single leg stance. RLE buckling noted on this date with toe taps on step. Pt will benefit from physical therapy services to improve overall balance and LE strength with emphasis on hip stability.                           PT Education - 04/04/16 1251    Education provided Yes   Education Details Reinforced HEP   Person(s) Educated Patient   Methods Explanation   Comprehension Verbalized understanding             PT Long Term Goals - 03/14/16 1717      PT LONG TERM GOAL #1   Title Pt will score >45/56 on BERG balance assessment to increase functional mobility and decrease fall risk   Baseline 11/1: 36/56   Time 4   Period Weeks   Status New     PT LONG TERM GOAL #2   Title Pt will complete LEFS outcome measure to assess current functional impairment with ADLs   Baseline 11/1: uncompleted   Time 4   Period Weeks   Status New     PT LONG TERM GOAL #3   Title Pt will increase LE strength 1/2 per MMT in hip flexion, knee extension, knee flexion to promote return to prior strength so that he can perform household duties independently   Baseline 11/1: MMT LE R/L hip flexion 4+/4+, knee extension 4+/4+, knee flexion 4/4+, dorsiflexion 0/3+, plantarflexion 4+/4+, hip abd/add not tested formally but [facility]   Time 4   Period Weeks   Status New     PT LONG TERM GOAL #4   Title Pt will perform sit<>stand with unilateral UE assist or hands on knees in clinic chair to progress toward previous level of function   Baseline 11/1: pt cannot stand from clinic chair without bilateral UE assist  on arm rests   Time 4   Period Weeks   Status New               Plan - 04/04/16 1252    Clinical Impression Statement Pt demonstrates difficulty with stepping activities that challenge single leg balance. Some RLE buckling noted during toe taps. Attempted single leg bridges and single leg TG squats but pt does not possess adequate strength to perform. He does become intermittently fatigued during session requiring seated rest breaks to recover. Pt encouraged to continue HEP and follow-up as scheduled.    Rehab Potential Good   Clinical Impairments Affecting Rehab Potential Positive: family support, PLOF. Negative: dx, permanent nerve  damage   PT Frequency 2x / week   PT Duration 4 weeks   PT Treatment/Interventions ADLs/Self Care Home Management;Biofeedback;Cryotherapy;Electrical Stimulation;Gait training;Stair training;Functional mobility training;Therapeutic activities;Therapeutic exercise;Balance training;Neuromuscular re-education;Patient/family education;Orthotic Fit/Training;Manual techniques   PT Next Visit Plan Static/dynamic balance beginning at partial tandem. Stepping tasks. Progressive strengthening and balance program.   PT Home Exercise Plan see pt instructions for detailed HEP. Continue as prescribed   Consulted and Agree with Plan of Care Patient;Family member/caregiver      Patient will benefit from skilled therapeutic intervention in order to improve the following deficits and impairments:  Abnormal gait, Decreased activity tolerance, Decreased balance, Decreased coordination, Decreased endurance, Decreased knowledge of use of DME, Decreased mobility, Decreased skin integrity, Decreased strength, Difficulty walking, Impaired perceived functional ability, Postural dysfunction  Visit Diagnosis: Difficulty in walking, not elsewhere classified  History of falling     Problem List Patient Active Problem List   Diagnosis Date Noted  . Chronic systolic heart failure  ([place]) 01/17/2016  . Chewing tobacco use 01/17/2016  . Atrial fibrillation with RVR ([place]) 12/23/2015  . Dyspnea on exertion 12/23/2015  . Elevated troponin 12/23/2015  . GERD (gastroesophageal reflux disease) 12/23/2015  . Other iron deficiency anemias 12/20/2015  . Femoral neck fracture, left, closed, initial encounter 11/03/2015  . Chronic atrial fibrillation ([place])   . Coronary artery disease involving native coronary artery of native heart without angina pectoris   . Acalculous cholecystitis 10/29/2015  . Adenopathy   . CAD (coronary artery disease) 12/22/2014  . HTN (hypertension) 12/22/2014   Phillips Grout PT, DPT   Huprich,Jason 04/04/2016, 12:54 PM  [place] [facility] [address] [place], [place], [address] Phone: [phone]   Fax:  [phone]  Name: Andres Gutierrez MRN: [id number] Date of Birth: 10-07-1930

## 2016-04-09 ENCOUNTER — Ambulatory Visit: Payer: PPO | Admitting: Physical Therapy

## 2016-04-09 DIAGNOSIS — R262 Difficulty in walking, not elsewhere classified: Secondary | ICD-10-CM

## 2016-04-09 DIAGNOSIS — H00022 Hordeolum internum right lower eyelid: Secondary | ICD-10-CM | POA: Diagnosis not present

## 2016-04-09 DIAGNOSIS — M21371 Foot drop, right foot: Secondary | ICD-10-CM

## 2016-04-09 DIAGNOSIS — Z9181 History of falling: Secondary | ICD-10-CM

## 2016-04-09 NOTE — Therapy (Signed)
[facility] [address]. [place], [place], [address] Phone: [phone]   Fax:  [phone]  Physical Therapy Treatment  Patient Details  Name: Andres Gutierrez MRN: [id number] Date of Birth: September 10, 1930 Referring Provider: Emily Filbert MD  Encounter Date: 04/09/2016      PT End of Session - 04/10/16 1335    Visit Number 8   Number of Visits 8   Date for PT Re-Evaluation 04/12/16   Authorization - Visit Number 8   Authorization - Number of Visits 10   PT Start Time 3354   PT Stop Time 1723   PT Time Calculation (min) 59 min   Equipment Utilized During Treatment Gait belt   Activity Tolerance Patient tolerated treatment well;No increased pain   Behavior During Therapy WFL for tasks assessed/performed      Past Medical History:  Diagnosis Date  . Anemia   . Anxiety   . Atrial fibrillation ([place])   . Cardiomyopathy ([place])   . CHF (congestive heart failure) ([place])   . Chronic kidney disease    kidney stones  . Coronary artery disease   . Cough   . Dysrhythmia   . Hypertension   . Lymphadenopathy, hilar   . MI (myocardial infarction)    x 2  . Wheezing     Past Surgical History:  Procedure Laterality Date  . BRONCHIAL NEEDLE ASPIRATION BIOPSY N/A 12/31/2014   Procedure: BRONCHIAL NEEDLE ASPIRATION BIOPSIES from carina;  Surgeon: Flora Lipps, MD;  Location: [facility] ORS;  Service: Cardiopulmonary;  Laterality: N/A;  . CARDIAC CATHETERIZATION N/A 12/28/2015   Procedure: Left Heart Cath and Cors/Grafts Angiography;  Surgeon: Yolonda Kida, MD;  Location: [place] [facility];  Service: Cardiovascular;  Laterality: N/A;  . CORONARY ANGIOPLASTY WITH STENT PLACEMENT    . CORONARY ARTERY BYPASS GRAFT    . ENDOBRONCHIAL ULTRASOUND N/A 12/31/2014   Procedure: ENDOBRONCHIAL ULTRASOUND;  Surgeon: Flora Lipps, MD;  Location: [facility] ORS;  Service: Cardiopulmonary;  Laterality: N/A;  . ESOPHAGOGASTRODUODENOSCOPY (EGD) WITH PROPOFOL N/A  06/20/2015   Procedure: ESOPHAGOGASTRODUODENOSCOPY (EGD) WITH PROPOFOL;  Surgeon: Lollie Sails, MD;  Location: [facility];  Service: Endoscopy;  Laterality: N/A;    There were no vitals filed for this visit.      Subjective Assessment - 04/10/16 1334    Subjective Pt. reports no new issues with LE but enters PT with significant sty under R eye.  Significant redness around inferior border of R eye and pt. started antibiotics.     Patient is accompained by: Family member   Pertinent History R foot drop beginning in June of 2017 due to spinal stenosis. Hx of chronic foot ulceration, R foot/leg cellulitis due to AFO use.   Limitations Walking;Standing;House hold activities   How long can you stand comfortably? current standing tolerance without support 7 minutes   Patient Stated Goals pt would like to improve his strength, endurance and balance so he can return to normal activity level   Currently in Pain? No/denies      Objective:  Therapeutic Exercise:  SciFit Level 7.5 x 7 minutes (no charge).  TG squats 2 x 10 B knee flexion/  Heel raises (assist to knee provided without use of AFO).  Hooklying bridge with YTB at proximal knee and arms crossed behind head 2x10, attempted R single leg but unable to perform; Hooklyingclamshell with manual resistance 2 x 10, Hooklying adduction with manual resistance 2 x 10; Sit to stand without UE support  from chair with minimal assistance from therapist 2 x 5 (blue mat table/ adjustable height).  Neuromuscular Re-ed: Toe taps to 6" step alternating LE x 15 (R knee buckling during L hip flexion without use of UE).   Side stepping in // bars x 4 lengths; Semitandem balance alternating LE forward x multiple bouts; Semitandem balance alternating LE forward with horizontal head turns multiple bouts. Gait Training: ambulate in clinic with use of QC on L with instruction on proper step pattern (2-3 point gait).  Cuing for hip flexion/ step pattern/  heel strike without use of AFO.  No episodes of R knee buckling while amb. Short distances with QC.   Pt response for medical necessity: Pt demonstrates good motivation for all tasks this session. He demonstrates most difficulty with stepping tasks and balance in single leg stance. RLE buckling noted on this date with toe taps on step. Pt will benefit from physical therapy services to improve overall balance and LE strength with emphasis on hip stability.       PT Long Term Goals - 03/14/16 1717      PT LONG TERM GOAL #1   Title Pt will score >45/56 on BERG balance assessment to increase functional mobility and decrease fall risk   Baseline 11/1: 36/56   Time 4   Period Weeks   Status New     PT LONG TERM GOAL #2   Title Pt will complete LEFS outcome measure to assess current functional impairment with ADLs   Baseline 11/1: uncompleted   Time 4   Period Weeks   Status New     PT LONG TERM GOAL #3   Title Pt will increase LE strength 1/2 per MMT in hip flexion, knee extension, knee flexion to promote return to prior strength so that he can perform household duties independently   Baseline 11/1: MMT LE R/L hip flexion 4+/4+, knee extension 4+/4+, knee flexion 4/4+, dorsiflexion 0/3+, plantarflexion 4+/4+, hip abd/add not tested formally but [facility]   Time 4   Period Weeks   Status New     PT LONG TERM GOAL #4   Title Pt will perform sit<>stand with unilateral UE assist or hands on knees in clinic chair to progress toward previous level of function   Baseline 11/1: pt cannot stand from clinic chair without bilateral UE assist on arm rests   Time 4   Period Weeks   Status New           Plan - 04/10/16 1335    Clinical Impression Statement Pt. has continued difficulty with R LE stance during L hip flexion/ step touches without UE assist.  Pt. able to ambulate short distances in clinic with SBA/CGA and use of QC on L side (2-3 point gait pattern).  Pt. hoping to progress to a more  mod. independent gait pattern with least assistive device (QC) to improve overall mobility.       Rehab Potential Good   Clinical Impairments Affecting Rehab Potential Positive: family support, PLOF. Negative: dx, permanent nerve damage   PT Frequency 2x / week   PT Duration 4 weeks   PT Treatment/Interventions ADLs/Self Care Home Management;Biofeedback;Cryotherapy;Electrical Stimulation;Gait training;Stair training;Functional mobility training;Therapeutic activities;Therapeutic exercise;Balance training;Neuromuscular re-education;Patient/family education;Orthotic Fit/Training;Manual techniques   PT Next Visit Plan Static/dynamic balance beginning at partial tandem. Stepping tasks. Progressive strengthening and balance program.  RECERT next tx. session (Check goals).    PT Home Exercise Plan see pt instructions for detailed HEP. Continue as prescribed  Consulted and Agree with Plan of Care Patient;Family member/caregiver      Patient will benefit from skilled therapeutic intervention in order to improve the following deficits and impairments:  Abnormal gait, Decreased activity tolerance, Decreased balance, Decreased coordination, Decreased endurance, Decreased knowledge of use of DME, Decreased mobility, Decreased skin integrity, Decreased strength, Difficulty walking, Impaired perceived functional ability, Postural dysfunction  Visit Diagnosis: Difficulty in walking, not elsewhere classified  History of falling  Foot drop, right     Problem List Patient Active Problem List   Diagnosis Date Noted  . Chronic systolic heart failure ([place]) 01/17/2016  . Chewing tobacco use 01/17/2016  . Atrial fibrillation with RVR ([place]) 12/23/2015  . Dyspnea on exertion 12/23/2015  . Elevated troponin 12/23/2015  . GERD (gastroesophageal reflux disease) 12/23/2015  . Other iron deficiency anemias 12/20/2015  . Femoral neck fracture, left, closed, initial encounter 11/03/2015  . Chronic atrial  fibrillation ([place])   . Coronary artery disease involving native coronary artery of native heart without angina pectoris   . Acalculous cholecystitis 10/29/2015  . Adenopathy   . CAD (coronary artery disease) 12/22/2014  . HTN (hypertension) 12/22/2014   Pura Spice, PT, DPT # [phone] 04/10/2016, 2:51 PM  [facility] [address] [place], [place], [address] Phone: [phone]   Fax:  [phone]  Name: EYDEN DOBIE MRN: [id number] Date of Birth: 11/05/1930

## 2016-04-11 ENCOUNTER — Ambulatory Visit: Payer: PPO | Admitting: Physical Therapy

## 2016-04-11 DIAGNOSIS — Z9181 History of falling: Secondary | ICD-10-CM

## 2016-04-11 DIAGNOSIS — R262 Difficulty in walking, not elsewhere classified: Secondary | ICD-10-CM | POA: Diagnosis not present

## 2016-04-11 DIAGNOSIS — M21371 Foot drop, right foot: Secondary | ICD-10-CM

## 2016-04-12 NOTE — Therapy (Addendum)
[facility] [address] [place], [place], [address] Phone: [phone]   Fax:  [phone]  Physical Therapy Treatment  Patient Details  Name: Andres Gutierrez MRN: [id number] Date of Birth: [birth year] Referring Provider: Emily Filbert MD  Encounter Date: 04/11/2016      PT End of Session - 04/12/16 1817    Visit Number 9   Number of Visits 16   Date for PT Re-Evaluation 05/09/16   Authorization - Visit Number 9   Authorization - Number of Visits 18   PT Start Time 1700   PT Stop Time 1749   PT Time Calculation (min) 59 min   Equipment Utilized During Treatment Gait belt   Activity Tolerance Patient tolerated treatment well;No increased pain   Behavior During Therapy WFL for tasks assessed/performed      Past Medical History:  Diagnosis Date  . Anemia   . Anxiety   . Atrial fibrillation ([place])   . Cardiomyopathy ([place])   . CHF (congestive heart failure) ([place])   . Chronic kidney disease    kidney stones  . Coronary artery disease   . Cough   . Dysrhythmia   . Hypertension   . Lymphadenopathy, hilar   . MI (myocardial infarction)    x 2  . Wheezing     Past Surgical History:  Procedure Laterality Date  . BRONCHIAL NEEDLE ASPIRATION BIOPSY N/A 12/31/2014   Procedure: BRONCHIAL NEEDLE ASPIRATION BIOPSIES from carina;  Surgeon: Flora Lipps, MD;  Location: [facility] ORS;  Service: Cardiopulmonary;  Laterality: N/A;  . CARDIAC CATHETERIZATION N/A 12/28/2015   Procedure: Left Heart Cath and Cors/Grafts Angiography;  Surgeon: Yolonda Kida, MD;  Location: [place] [facility];  Service: Cardiovascular;  Laterality: N/A;  . CORONARY ANGIOPLASTY WITH STENT PLACEMENT    . CORONARY ARTERY BYPASS GRAFT    . ENDOBRONCHIAL ULTRASOUND N/A 12/31/2014   Procedure: ENDOBRONCHIAL ULTRASOUND;  Surgeon: Flora Lipps, MD;  Location: [facility] ORS;  Service: Cardiopulmonary;  Laterality: N/A;  . ESOPHAGOGASTRODUODENOSCOPY (EGD) WITH PROPOFOL N/A  06/20/2015   Procedure: ESOPHAGOGASTRODUODENOSCOPY (EGD) WITH PROPOFOL;  Surgeon: Lollie Sails, MD;  Location: [facility] ENDOSCOPY;  Service: Endoscopy;  Laterality: N/A;    There were no vitals filed for this visit.      Subjective Assessment - 04/12/16 1817    Patient is accompained by: Family member   Pertinent History R foot drop beginning in June of 2017 due to spinal stenosis. Hx of chronic foot ulceration, R foot/leg cellulitis due to AFO use.   Limitations Walking;Standing;House hold activities   How long can you stand comfortably? current standing tolerance without support 7 minutes   Patient Stated Goals pt would like to improve his strength, endurance and balance so he can return to normal activity level   Currently in Pain? No/denies      R eye looks worse today than last tx. session.  Pt. states he is taking antibiotics for eye.  No falls or LOB reported.  Pt. states he is compliant with HEP and walking program.      Objective:  Therapeutic Exercise:  SciFit Level 7.5 x 35mnutes (no charge).  TG squats 2 x 10 B knee flexion/  Heel raises (assist to knee provided without use of AFO).  Hooklyingclamshell with manual resistance 2 x 10, Hooklying adduction with manual resistance 2 x 10; Sit to stand without UE support from chair with minimal assistance from therapist 2 x 5 (blue mat table/ adjustable height). Reviewed  standing hip/knee ex. Program/ partial squats with chair behind and mirror feedback.    Neuromuscular Re-ed: Toe taps to 6" step alternating LE x 15 (R knee buckling during L hip flexion without use of UE).   Side stepping in // bars x 4 lengths; Semitandem balance alternating LE forward x multiple bouts; Semitandem balance alternating LE forward with horizontal head turns multiple bouts. Berg balance reassessment: 43/56.  Gait Training: ambulate in clinic with use of QC on L with instruction on proper step pattern (2-3 point gait).  Cuing for hip flexion/  step pattern/ heel strike without use of AFO.  No episodes of R knee buckling while amb. Short distances with QC.   Pt response for medical necessity: Pt demonstrates good motivation for all tasks this session. He demonstrates most difficulty with stepping tasks and balance in single leg stance. RLE buckling noted on this date with toe taps on step. Pt will benefit from physical therapy services to improve overall balance and LE strength with emphasis on hip stability.      Pt. remains limited with R LE stance during L LE hip flexion/ swing through phase of gait without UE assist.  LImited L LE step through due to muscle weakness/ instability of R LE.  LEFS:  22 out of 80.  Berg balance test: 43/56 (marked improvement since initial evaluation).  Pt. able to ambulate safely short community distances with SBA/CGA adn use of QC on L with slight antalgic gait.  Pt. working hard on progressing to a more mod. independent gait pattern with use of QC to improve safe overall mobility.         PT Long Term Goals - 04/12/16 1818      PT LONG TERM GOAL #1   Title Pt will score >45/56 on BERG balance assessment to increase functional mobility and decrease fall risk   Baseline 11/29: 43/56   Time 4   Period Weeks   Status Partially Met     PT LONG TERM GOAL #2   Title Pt will complete LEFS outcome measure to assess current functional impairment with ADLs   Baseline 11/29: LEFS 22 out of 80   Time 4   Period Weeks   Status On-going     PT LONG TERM GOAL #3   Title Pt will increase LE strength 1/2 per MMT in hip flexion, knee extension, knee flexion to promote return to prior strength so that he can perform household duties independently   Baseline 11/29: MMT LE R/L hip flexion 4+/4+, knee extension 4+/4+, knee flexion 4/4+, dorsiflexion 1/3+, plantarflexion 4+/4+, hip abd/add not tested formally but [facility]   Time 4   Period Weeks   Status Partially Met     PT LONG TERM GOAL #4   Title Pt will  perform sit<>stand with unilateral UE assist or hands on knees in clinic chair to progress toward previous level of function   Baseline benefits from B UE assist at this time (difficulty with low chair).   Time 4   Period Weeks   Status Not Met               Plan - 04/12/16 1818    Rehab Potential Good   Clinical Impairments Affecting Rehab Potential Positive: family support, PLOF. Negative: dx, permanent nerve damage   PT Frequency 2x / week   PT Duration 4 weeks   PT Treatment/Interventions ADLs/Self Care Home Management;Biofeedback;Cryotherapy;Electrical Stimulation;Gait training;Stair training;Functional mobility training;Therapeutic activities;Therapeutic exercise;Balance training;Neuromuscular re-education;Patient/family education;Orthotic Fit/Training;Manual  techniques   PT Next Visit Plan Static/dynamic balance beginning at partial tandem. Stepping tasks. Progressive strengthening and balance program.     PT Home Exercise Plan see pt instructions for detailed HEP. Continue as prescribed   Consulted and Agree with Plan of Care Patient;Family member/caregiver      Patient will benefit from skilled therapeutic intervention in order to improve the following deficits and impairments:  Abnormal gait, Decreased activity tolerance, Decreased balance, Decreased coordination, Decreased endurance, Decreased knowledge of use of DME, Decreased mobility, Decreased skin integrity, Decreased strength, Difficulty walking, Impaired perceived functional ability, Postural dysfunction  Visit Diagnosis: Difficulty in walking, not elsewhere classified  History of falling  Foot drop, right       G-Codes - 2016/04/27 1821    Functional Assessment Tool Used Clinical impression/ gait difficulty/ Berg balance test/ muscle weakness   Functional Limitation Mobility: Walking and moving around   Mobility: Walking and Moving Around Current Status [phone]) At least 40 percent but less than 60 percent  impaired, limited or restricted   Mobility: Walking and Moving Around Goal Status [phone]) At least 20 percent but less than 40 percent impaired, limited or restricted      Problem List Patient Active Problem List   Diagnosis Date Noted  . Chronic systolic heart failure ([place]) 01/17/2016  . Chewing tobacco use 01/17/2016  . Atrial fibrillation with RVR ([place]) 12/23/2015  . Dyspnea on exertion 12/23/2015  . Elevated troponin 12/23/2015  . GERD (gastroesophageal reflux disease) 12/23/2015  . Other iron deficiency anemias 12/20/2015  . Femoral neck fracture, left, closed, initial encounter 11/03/2015  . Chronic atrial fibrillation ([place])   . Coronary artery disease involving native coronary artery of native heart without angina pectoris   . Acalculous cholecystitis 10/29/2015  . Adenopathy   . CAD (coronary artery disease) 12/22/2014  . HTN (hypertension) 12/22/2014   Pura Spice, PT, DPT # [id number] 04/12/2016, 6:21 PM  [facility] [address] [place], [place], [address] Phone: [phone]   Fax:  [phone]  Name: Andres Gutierrez MRN: [id number] Date of Birth: [birth year]

## 2016-04-15 NOTE — Addendum Note (Signed)
Addended by: Pura Spice on: 04/15/2016 08:50 PM   Modules accepted: Orders

## 2016-04-16 ENCOUNTER — Ambulatory Visit: Payer: PPO | Attending: Internal Medicine | Admitting: Physical Therapy

## 2016-04-16 DIAGNOSIS — M21371 Foot drop, right foot: Secondary | ICD-10-CM | POA: Diagnosis not present

## 2016-04-16 DIAGNOSIS — Z9181 History of falling: Secondary | ICD-10-CM | POA: Diagnosis not present

## 2016-04-16 DIAGNOSIS — R262 Difficulty in walking, not elsewhere classified: Secondary | ICD-10-CM | POA: Diagnosis not present

## 2016-04-17 ENCOUNTER — Ambulatory Visit: Payer: PPO | Admitting: Physical Therapy

## 2016-04-17 ENCOUNTER — Encounter: Payer: Self-pay | Admitting: Physical Therapy

## 2016-04-17 NOTE — Therapy (Addendum)
[facility] [address]. [place], [place], [address] Phone: [phone]   Fax:  [phone]  Physical Therapy Treatment  Patient Details  Name: Andres Gutierrez MRN: [id number] Date of Birth: 12/29/1930 Referring Provider: Emily Filbert MD  Encounter Date: 04/16/2016      PT End of Session - 04/17/16 1709    Visit Number 10   Number of Visits 16   Date for PT Re-Evaluation 05/09/16   Authorization - Visit Number 10   Authorization - Number of Visits 18   PT Start Time 9937   PT Stop Time 1733   PT Time Calculation (min) 55 min   Equipment Utilized During Treatment Gait belt   Activity Tolerance Patient tolerated treatment well;No increased pain   Behavior During Therapy WFL for tasks assessed/performed      Past Medical History:  Diagnosis Date  . Anemia   . Anxiety   . Atrial fibrillation ([place])   . Cardiomyopathy ([place])   . CHF (congestive heart failure) ([place])   . Chronic kidney disease    kidney stones  . Coronary artery disease   . Cough   . Dysrhythmia   . Hypertension   . Lymphadenopathy, hilar   . MI (myocardial infarction)    x 2  . Wheezing     Past Surgical History:  Procedure Laterality Date  . BRONCHIAL NEEDLE ASPIRATION BIOPSY N/A 12/31/2014   Procedure: BRONCHIAL NEEDLE ASPIRATION BIOPSIES from carina;  Surgeon: Flora Lipps, MD;  Location: [facility] ORS;  Service: Cardiopulmonary;  Laterality: N/A;  . CARDIAC CATHETERIZATION N/A 12/28/2015   Procedure: Left Heart Cath and Cors/Grafts Angiography;  Surgeon: Yolonda Kida, MD;  Location: [place] [facility];  Service: Cardiovascular;  Laterality: N/A;  . CORONARY ANGIOPLASTY WITH STENT PLACEMENT    . CORONARY ARTERY BYPASS GRAFT    . ENDOBRONCHIAL ULTRASOUND N/A 12/31/2014   Procedure: ENDOBRONCHIAL ULTRASOUND;  Surgeon: Flora Lipps, MD;  Location: [facility] ORS;  Service: Cardiopulmonary;  Laterality: N/A;  . ESOPHAGOGASTRODUODENOSCOPY (EGD) WITH PROPOFOL N/A  06/20/2015   Procedure: ESOPHAGOGASTRODUODENOSCOPY (EGD) WITH PROPOFOL;  Surgeon: Lollie Sails, MD;  Location: [facility]OSCOPY;  Service: Endoscopy;  Laterality: N/A;    There were no vitals filed for this visit.      Subjective Assessment - 04/17/16 1709    Patient is accompained by: Family member   Pertinent History R foot drop beginning in June of 2017 due to spinal stenosis. Hx of chronic foot ulceration, R foot/leg cellulitis due to AFO use.   Limitations Walking;Standing;House hold activities   How long can you stand comfortably? current standing tolerance without support 7 minutes   Patient Stated Goals pt would like to improve his strength, endurance and balance so he can return to normal activity level   Currently in Pain? No/denies      Pt. has stye in B eyes with draining noted.  Pt. continues to be on antibiotics.  No falls but difficulty noted with vision.      Objective:  Therapeutic Exercise:  SciFit Level 7.5 x 19mnutes (warm-up/no charge).  TG squats 2 x 10 B knee flexion/ Heel raises (assist to knee provided without use of AFO).  Hooklyingclamshell with manual resistance 2 x 10, Hooklying adduction with manual resistance 2 x 10; Sit to stand without UE support from chair with minimal assistance from therapist 2 x 5 (blue mat table/ adjustable height). Lateral walking in //-bars with no UE assist working on upright posture/ hip  control 5x L/R.      Neuromuscular Re-ed: Cone taps/ touches alternating LE x 20 (UE assist required to prevent R knee buckling during L hip flexion without use of UE).  High marching in //-bars with no UE assist/ turning CW and CCW. Semitandem balance alternating LE forward x multiple bouts; Semitandem balance alternating LE forward with horizontal head turns multiple bouts.  Gait Training: ambulate in clinic with use of QC on L with instruction on proper step pattern (2-3 point gait). Issued QC for pt. To use of home and discuss  next tx.    Pt response for medical necessity: Pt demonstrates good motivation for all tasks this session. He demonstrates most difficulty with stepping tasks and balance in single leg stance. Pt. Requires light UE assist to prevent RLE buckling with toe taps on step. Pt will benefit from physical therapy services to improve overall balance and LE strength with emphasis on hip stability.     Pt discussed a change in tx. schedule.  Difficulty with LE muscle control during step ups/ cone taps in //-bars.  Pt. requires use of UE assist for safety and progressing well to use of QC with more daily ambulation.  Pt. will borrow QC for home use to assess walking at home.          PT Long Term Goals - 04/12/16 1818      PT LONG TERM GOAL #1   Title Pt will score >45/56 on BERG balance assessment to increase functional mobility and decrease fall risk   Baseline 11/29: 43/56   Time 4   Period Weeks   Status Partially Met     PT LONG TERM GOAL #2   Title Pt will complete LEFS outcome measure to assess current functional impairment with ADLs   Baseline 11/29: LEFS 22 out of 80   Time 4   Period Weeks   Status On-going     PT LONG TERM GOAL #3   Title Pt will increase LE strength 1/2 per MMT in hip flexion, knee extension, knee flexion to promote return to prior strength so that he can perform household duties independently   Baseline 11/29: MMT LE R/L hip flexion 4+/4+, knee extension 4+/4+, knee flexion 4/4+, dorsiflexion 1/3+, plantarflexion 4+/4+, hip abd/add not tested formally but [facility]   Time 4   Period Weeks   Status Partially Met     PT LONG TERM GOAL #4   Title Pt will perform sit<>stand with unilateral UE assist or hands on knees in clinic chair to progress toward previous level of function   Baseline benefits from B UE assist at this time (difficulty with low chair).   Time 4   Period Weeks   Status Not Met             Plan - 04/17/16 1710    Rehab Potential Good    Clinical Impairments Affecting Rehab Potential Positive: family support, PLOF. Negative: dx, permanent nerve damage   PT Frequency 2x / week   PT Duration 4 weeks   PT Treatment/Interventions ADLs/Self Care Home Management;Biofeedback;Cryotherapy;Electrical Stimulation;Gait training;Stair training;Functional mobility training;Therapeutic activities;Therapeutic exercise;Balance training;Neuromuscular re-education;Patient/family education;Orthotic Fit/Training;Manual techniques   PT Next Visit Plan Static/dynamic balance beginning at partial tandem. Stepping tasks. Progressive strengthening and balance program.     PT Home Exercise Plan see pt instructions for detailed HEP. Continue as prescribed   Consulted and Agree with Plan of Care Patient;Family member/caregiver      Patient will benefit from skilled  therapeutic intervention in order to improve the following deficits and impairments:  Abnormal gait, Decreased activity tolerance, Decreased balance, Decreased coordination, Decreased endurance, Decreased knowledge of use of DME, Decreased mobility, Decreased skin integrity, Decreased strength, Difficulty walking, Impaired perceived functional ability, Postural dysfunction  Visit Diagnosis: Difficulty in walking, not elsewhere classified  History of falling  Foot drop, right     Problem List Patient Active Problem List   Diagnosis Date Noted  . Chronic systolic heart failure ([place]) 01/17/2016  . Chewing tobacco use 01/17/2016  . Atrial fibrillation with RVR ([place]) 12/23/2015  . Dyspnea on exertion 12/23/2015  . Elevated troponin 12/23/2015  . GERD (gastroesophageal reflux disease) 12/23/2015  . Other iron deficiency anemias 12/20/2015  . Femoral neck fracture, left, closed, initial encounter 11/03/2015  . Chronic atrial fibrillation ([place])   . Coronary artery disease involving native coronary artery of native heart without angina pectoris   . Acalculous cholecystitis 10/29/2015  .  Adenopathy   . CAD (coronary artery disease) 12/22/2014  . HTN (hypertension) 12/22/2014   Pura Spice, PT, DPT # [phone] 04/17/2016, 5:11 PM  [facility] [address] [place], [place], [address] Phone: [phone]   Fax:  [phone]  Name: Andres Gutierrez MRN: [id number] Date of Birth: 1931-03-14

## 2016-04-19 ENCOUNTER — Ambulatory Visit: Payer: PPO | Admitting: Physical Therapy

## 2016-04-19 DIAGNOSIS — Z9181 History of falling: Secondary | ICD-10-CM

## 2016-04-19 DIAGNOSIS — H00022 Hordeolum internum right lower eyelid: Secondary | ICD-10-CM | POA: Diagnosis not present

## 2016-04-19 DIAGNOSIS — M21371 Foot drop, right foot: Secondary | ICD-10-CM

## 2016-04-19 DIAGNOSIS — R262 Difficulty in walking, not elsewhere classified: Secondary | ICD-10-CM

## 2016-04-20 NOTE — Therapy (Addendum)
[facility] [address] [place], [place], [address] Phone: [phone]   Fax:  [phone]  Physical Therapy Treatment  Patient Details  Name: Andres Gutierrez MRN: [id number] Date of Birth: 22-May-1930 Referring Provider: Emily Filbert MD  Encounter Date: 04/19/2016      PT End of Session - 04/20/16 1553    Visit Number 11   Number of Visits 16   Date for PT Re-Evaluation 05/09/16   Authorization - Visit Number 11   Authorization - Number of Visits 18   PT Start Time 1618   PT Stop Time 1718   PT Time Calculation (min) 60 min   Equipment Utilized During Treatment Gait belt   Activity Tolerance Patient tolerated treatment well;No increased pain   Behavior During Therapy WFL for tasks assessed/performed      Past Medical History:  Diagnosis Date  . Anemia   . Anxiety   . Atrial fibrillation ([place])   . Cardiomyopathy ([place])   . CHF (congestive heart failure) ([place])   . Chronic kidney disease    kidney stones  . Coronary artery disease   . Cough   . Dysrhythmia   . Hypertension   . Lymphadenopathy, hilar   . MI (myocardial infarction)    x 2  . Wheezing     Past Surgical History:  Procedure Laterality Date  . BRONCHIAL NEEDLE ASPIRATION BIOPSY N/A 12/31/2014   Procedure: BRONCHIAL NEEDLE ASPIRATION BIOPSIES from carina;  Surgeon: Flora Lipps, MD;  Location: [facility] ORS;  Service: Cardiopulmonary;  Laterality: N/A;  . CARDIAC CATHETERIZATION N/A 12/28/2015   Procedure: Left Heart Cath and Cors/Grafts Angiography;  Surgeon: Yolonda Kida, MD;  Location: [place] [facility];  Service: Cardiovascular;  Laterality: N/A;  . CORONARY ANGIOPLASTY WITH STENT PLACEMENT    . CORONARY ARTERY BYPASS GRAFT    . ENDOBRONCHIAL ULTRASOUND N/A 12/31/2014   Procedure: ENDOBRONCHIAL ULTRASOUND;  Surgeon: Flora Lipps, MD;  Location: [facility] ORS;  Service: Cardiopulmonary;  Laterality: N/A;  . ESOPHAGOGASTRODUODENOSCOPY (EGD) WITH PROPOFOL N/A  06/20/2015   Procedure: ESOPHAGOGASTRODUODENOSCOPY (EGD) WITH PROPOFOL;  Surgeon: Lollie Sails, MD;  Location: [facility] ENDOSCOPY;  Service: Endoscopy;  Laterality: N/A;    There were no vitals filed for this visit.      Subjective Assessment - 04/20/16 1552    Pertinent History R foot drop beginning in June of 2017 due to spinal stenosis. Hx of chronic foot ulceration, R foot/leg cellulitis due to AFO use.   Limitations Walking;Standing;House hold activities   How long can you stand comfortably? current standing tolerance without support 7 minutes   Patient Stated Goals pt would like to improve his strength, endurance and balance so he can return to normal activity level      Pt. states he didn't use QC much at home with family assist.  Pt. prefers RW at this time.  Pt. states he will reconsider use of QC soon.  No falls or LOB.       Objective:  Therapeutic Exercise:  SciFit Level 8 x 6mnutes for 1.1 miles (no charge/ warm-up)- increase time and resistance with no issues.  2.5# ankle wt.: seated hip flexion/ LAQ/ heel raises (no AFO) and standing hip/knee ex. (fatigue noted). Sit to stand without UE support from chair with minimal assistance from therapist 2 x 5 (blue mat table/ adjustable height).  Walking in clinic/ hallway with ankle wt. And no AFO (cuing to increase hip flexion/ use of QC).  Neuromuscular Re-ed:  Step touches L/R with light UE assist 10x2 (mirror feedback). Forward/lateral walking in //-bars with no UE assist; Semitandem balance alternating LE forward x multiple bouts; Gait Training: ambulate in clinic with use of QC on L with instruction on proper step pattern (2-3 point gait). Cuing for hip flexion/ step pattern/ heel strike without use of AFO. No episodes of R knee buckling while amb. Short distances with QC. Increase step length but limited heel strike with/without use of AFO.    Pt response for medical necessity:  Pt will benefit from physical therapy  services to improve overall balance and LE strength with emphasis on hip stability.  Unable to safely complete SLS but improving gait pattern with use of QC.   Pt. continues to remain motivated during tx. session and works hard with standing ther.ex./ resisted tasks.  No LOB during tx. but light UE assist in //-bars/ hallway with turning, LE hip flexion and step ups due to muscle weakness/ fear of falling.  Good muscle endurance with Scifit ex. with no rest breaks and increase time/ resistance.          PT Long Term Goals - 04/12/16 1818      PT LONG TERM GOAL #1   Title Pt will score >45/56 on BERG balance assessment to increase functional mobility and decrease fall risk   Baseline 11/29: 43/56   Time 4   Period Weeks   Status Partially Met     PT LONG TERM GOAL #2   Title Pt will complete LEFS outcome measure to assess current functional impairment with ADLs   Baseline 11/29: LEFS 22 out of 80   Time 4   Period Weeks   Status On-going     PT LONG TERM GOAL #3   Title Pt will increase LE strength 1/2 per MMT in hip flexion, knee extension, knee flexion to promote return to prior strength so that he can perform household duties independently   Baseline 11/29: MMT LE R/L hip flexion 4+/4+, knee extension 4+/4+, knee flexion 4/4+, dorsiflexion 1/3+, plantarflexion 4+/4+, hip abd/add not tested formally but [facility]   Time 4   Period Weeks   Status Partially Met     PT LONG TERM GOAL #4   Title Pt will perform sit<>stand with unilateral UE assist or hands on knees in clinic chair to progress toward previous level of function   Baseline benefits from B UE assist at this time (difficulty with low chair).   Time 4   Period Weeks   Status Not Met               Plan - 04/20/16 1554    Rehab Potential Good   Clinical Impairments Affecting Rehab Potential Positive: family support, PLOF. Negative: dx, permanent nerve damage   PT Frequency 2x / week   PT Duration 4 weeks   PT  Treatment/Interventions ADLs/Self Care Home Management;Biofeedback;Cryotherapy;Electrical Stimulation;Gait training;Stair training;Functional mobility training;Therapeutic activities;Therapeutic exercise;Balance training;Neuromuscular re-education;Patient/family education;Orthotic Fit/Training;Manual techniques   PT Next Visit Plan Static/dynamic balance beginning at partial tandem. Stepping tasks. Progressive strengthening and balance program.  Discuss use of QC at home.     PT Home Exercise Plan see pt instructions for detailed HEP. Continue as prescribed   Consulted and Agree with Plan of Care Patient;Family member/caregiver      Patient will benefit from skilled therapeutic intervention in order to improve the following deficits and impairments:  Abnormal gait, Decreased activity tolerance, Decreased balance, Decreased coordination, Decreased endurance, Decreased knowledge of  use of DME, Decreased mobility, Decreased skin integrity, Decreased strength, Difficulty walking, Impaired perceived functional ability, Postural dysfunction  Visit Diagnosis: Difficulty in walking, not elsewhere classified  History of falling  Foot drop, right     Problem List Patient Active Problem List   Diagnosis Date Noted  . Chronic systolic heart failure ([place]) 01/17/2016  . Chewing tobacco use 01/17/2016  . Atrial fibrillation with RVR ([place]) 12/23/2015  . Dyspnea on exertion 12/23/2015  . Elevated troponin 12/23/2015  . GERD (gastroesophageal reflux disease) 12/23/2015  . Other iron deficiency anemias 12/20/2015  . Femoral neck fracture, left, closed, initial encounter 11/03/2015  . Chronic atrial fibrillation ([place])   . Coronary artery disease involving native coronary artery of native heart without angina pectoris   . Acalculous cholecystitis 10/29/2015  . Adenopathy   . CAD (coronary artery disease) 12/22/2014  . HTN (hypertension) 12/22/2014   Pura Spice, PT, DPT # [phone] 04/20/2016, 4:00  PM  [facility] [address] [place], [place], [address] Phone: [phone]   Fax:  [phone]  Name: Andres Gutierrez MRN: [id number] Date of Birth: 1931-04-13

## 2016-04-23 ENCOUNTER — Ambulatory Visit: Payer: PPO | Admitting: Physical Therapy

## 2016-04-23 DIAGNOSIS — Z9181 History of falling: Secondary | ICD-10-CM

## 2016-04-23 DIAGNOSIS — R262 Difficulty in walking, not elsewhere classified: Secondary | ICD-10-CM

## 2016-04-23 DIAGNOSIS — M21371 Foot drop, right foot: Secondary | ICD-10-CM

## 2016-04-24 ENCOUNTER — Ambulatory Visit: Payer: PPO | Admitting: Physical Therapy

## 2016-04-24 NOTE — Therapy (Addendum)
[facility] [address] [place], [place], [address] Phone: [phone]   Fax:  [phone]  Physical Therapy Treatment  Patient Details  Name: Andres Gutierrez MRN: [id number] Date of Birth: Aug 07, 1930 Referring Provider: Emily Filbert MD  Encounter Date: 04/23/2016      PT End of Session - 04/24/16 1826    Visit Number 12   Number of Visits 16   Date for PT Re-Evaluation 05/09/16   Authorization - Visit Number 12   Authorization - Number of Visits 18   PT Start Time 8270   PT Stop Time 7867   PT Time Calculation (min) 56 min   Equipment Utilized During Treatment Gait belt   Activity Tolerance Patient tolerated treatment well;No increased pain   Behavior During Therapy WFL for tasks assessed/performed      Past Medical History:  Diagnosis Date  . Anemia   . Anxiety   . Atrial fibrillation ([place])   . Cardiomyopathy ([place])   . CHF (congestive heart failure) ([place])   . Chronic kidney disease    kidney stones  . Coronary artery disease   . Cough   . Dysrhythmia   . Hypertension   . Lymphadenopathy, hilar   . MI (myocardial infarction)    x 2  . Wheezing     Past Surgical History:  Procedure Laterality Date  . BRONCHIAL NEEDLE ASPIRATION BIOPSY N/A 12/31/2014   Procedure: BRONCHIAL NEEDLE ASPIRATION BIOPSIES from carina;  Surgeon: Flora Lipps, MD;  Location: [facility] ORS;  Service: Cardiopulmonary;  Laterality: N/A;  . CARDIAC CATHETERIZATION N/A 12/28/2015   Procedure: Left Heart Cath and Cors/Grafts Angiography;  Surgeon: Yolonda Kida, MD;  Location: [place] CV LAB;  Service: Cardiovascular;  Laterality: N/A;  . CORONARY ANGIOPLASTY WITH STENT PLACEMENT    . CORONARY ARTERY BYPASS GRAFT    . ENDOBRONCHIAL ULTRASOUND N/A 12/31/2014   Procedure: ENDOBRONCHIAL ULTRASOUND;  Surgeon: Flora Lipps, MD;  Location: [facility] ORS;  Service: Cardiopulmonary;  Laterality: N/A;  . ESOPHAGOGASTRODUODENOSCOPY (EGD) WITH PROPOFOL N/A  06/20/2015   Procedure: ESOPHAGOGASTRODUODENOSCOPY (EGD) WITH PROPOFOL;  Surgeon: Lollie Sails, MD;  Location: [facility] ENDOSCOPY;  Service: Endoscopy;  Laterality: N/A;    There were no vitals filed for this visit.      Subjective Assessment - 04/24/16 1826    Subjective Pt. states he had limited use of QC at home due to set-up of furniture and prefers walker at this time.  Pt. returned loaner QC.  No new complaints at this time.     Patient is accompained by: Family member   Pertinent History R foot drop beginning in June of 2017 due to spinal stenosis. Hx of chronic foot ulceration, R foot/leg cellulitis due to AFO use.   Limitations Walking;Standing;House hold activities   How long can you stand comfortably? current standing tolerance without support 7 minutes   Patient Stated Goals pt would like to improve his strength, endurance and balance so he can return to normal activity level   Currently in Pain? No/denies      Objective:  Therapeutic Exercise:  SciFit Level 8 x 18mnutes (no charge/ warm-up)- consistent cadence/ no rest breaks.  Seated R ankle/gastroc stretches (all planes)- no AFO during therex./ balance tasks. Sit to stand without UE support from chair with Airex pad with minimal assistance from therapist 10x. Standing hip ex. With min. To no UE assist (no ankle wts.)- hip flexion/ abd./ knee flexion/ heel raises/ hip abd. Neuromuscular Re-ed: [facility]one  taps L/R with light UE assist 10x2 (mirror feedback). Forward/lateral walking in //-bars with no UE assist; Airex: partial lunges on L/R 10x2 each/ standing heel raises (significant difficulty)/ step ups on Airex; Gait Training: ambulate in clinic with use of QC on L with instruction on proper step pattern (2-point gait). Cuing for hip flexion/ step pattern/ heel strike/ upright posture without use of AFO. No episodes of R knee buckling while amb. Short distances with QC. Increase step length but limited heel strike  with/without use of AFO.    Pt response for medical necessity:  Pt will benefit from physical therapy services to improve overall balance and LE strength with emphasis on hip stability.  Unable to safely complete SLS but improving gait pattern with use of QC.    Pt. demonstrates improved step pattern/ 2-point gait with use of QC in clinic.  Pt. able to ambulate with similar gait patterns with or without use of AFO.  Pt. has no R ankle DF/IV/EV without use of AFO but limited foot drop noted during step ups/ gait pattern.  Tight R gastroc as compared to L during stretches in seated position.  Good tx. endurance with limited rest breaks required and pt. remain highly motivated to increase B LE strength/ independence with gait.          PT Long Term Goals - 04/12/16 1818      PT LONG TERM GOAL #1   Title Pt will score >45/56 on BERG balance assessment to increase functional mobility and decrease fall risk   Baseline 11/29: 43/56   Time 4   Period Weeks   Status Partially Met     PT LONG TERM GOAL #2   Title Pt will complete LEFS outcome measure to assess current functional impairment with ADLs   Baseline 11/29: LEFS 22 out of 80   Time 4   Period Weeks   Status On-going     PT LONG TERM GOAL #3   Title Pt will increase LE strength 1/2 per MMT in hip flexion, knee extension, knee flexion to promote return to prior strength so that he can perform household duties independently   Baseline 11/29: MMT LE R/L hip flexion 4+/4+, knee extension 4+/4+, knee flexion 4/4+, dorsiflexion 1/3+, plantarflexion 4+/4+, hip abd/add not tested formally but [facility]   Time 4   Period Weeks   Status Partially Met     PT LONG TERM GOAL #4   Title Pt will perform sit<>stand with unilateral UE assist or hands on knees in clinic chair to progress toward previous level of function   Baseline benefits from B UE assist at this time (difficulty with low chair).   Time 4   Period Weeks   Status Not Met                Plan - 04/24/16 1826    Rehab Potential Good   Clinical Impairments Affecting Rehab Potential Positive: family support, PLOF. Negative: dx, permanent nerve damage   PT Frequency 2x / week   PT Duration 4 weeks   PT Treatment/Interventions ADLs/Self Care Home Management;Biofeedback;Cryotherapy;Electrical Stimulation;Gait training;Stair training;Functional mobility training;Therapeutic activities;Therapeutic exercise;Balance training;Neuromuscular re-education;Patient/family education;Orthotic Fit/Training;Manual techniques   PT Next Visit Plan Static/dynamic balance beginning at partial tandem. Stepping tasks. Progressive strengthening and balance program.     PT Home Exercise Plan see pt instructions for detailed HEP. Continue as prescribed   Consulted and Agree with Plan of Care Patient;Family member/caregiver      Patient will benefit  from skilled therapeutic intervention in order to improve the following deficits and impairments:  Abnormal gait, Decreased activity tolerance, Decreased balance, Decreased coordination, Decreased endurance, Decreased knowledge of use of DME, Decreased mobility, Decreased skin integrity, Decreased strength, Difficulty walking, Impaired perceived functional ability, Postural dysfunction  Visit Diagnosis: Difficulty in walking, not elsewhere classified  History of falling  Foot drop, right     Problem List Patient Active Problem List   Diagnosis Date Noted  . Chronic systolic heart failure ([place]) 01/17/2016  . Chewing tobacco use 01/17/2016  . Atrial fibrillation with RVR ([place]) 12/23/2015  . Dyspnea on exertion 12/23/2015  . Elevated troponin 12/23/2015  . GERD (gastroesophageal reflux disease) 12/23/2015  . Other iron deficiency anemias 12/20/2015  . Femoral neck fracture, left, closed, initial encounter 11/03/2015  . Chronic atrial fibrillation ([place])   . Coronary artery disease involving native coronary artery of native heart  without angina pectoris   . Acalculous cholecystitis 10/29/2015  . Adenopathy   . CAD (coronary artery disease) 12/22/2014  . HTN (hypertension) 12/22/2014   Pura Spice, PT, DPT # [id number] 04/24/2016, 6:28 PM  [employer] [facility] [address] [place], [place], [address] Phone: [phone]   Fax:  [phone]  Name: Andres Gutierrez MRN: [id number] Date of Birth: January 22, 1931

## 2016-04-26 ENCOUNTER — Ambulatory Visit: Payer: PPO

## 2016-04-26 ENCOUNTER — Encounter: Payer: Self-pay | Admitting: Physical Therapy

## 2016-04-26 DIAGNOSIS — Z9181 History of falling: Secondary | ICD-10-CM

## 2016-04-26 DIAGNOSIS — R262 Difficulty in walking, not elsewhere classified: Secondary | ICD-10-CM

## 2016-04-26 NOTE — Therapy (Signed)
[facility] [address] [place], [place], [address] Phone: [phone]   Fax:  [phone]  Physical Therapy Treatment  Patient Details  Name: Andres Gutierrez MRN: [id number] Date of Birth: Feb 21, 1931 Referring Provider: Emily Filbert MD  Encounter Date: 04/26/2016      PT End of Session - 04/26/16 1651    Visit Number 13   Number of Visits 16   Date for PT Re-Evaluation 05/09/16   Authorization - Visit Number 13   Authorization - Number of Visits 18   PT Start Time 9381   PT Stop Time 1720   PT Time Calculation (min) 42 min   Equipment Utilized During Treatment Gait belt   Activity Tolerance Patient tolerated treatment well;No increased pain   Behavior During Therapy WFL for tasks assessed/performed      Past Medical History:  Diagnosis Date  . Anemia   . Anxiety   . Atrial fibrillation ([place])   . Cardiomyopathy ([place])   . CHF (congestive heart failure) ([place])   . Chronic kidney disease    kidney stones  . Coronary artery disease   . Cough   . Dysrhythmia   . Hypertension   . Lymphadenopathy, hilar   . MI (myocardial infarction)    x 2  . Wheezing     Past Surgical History:  Procedure Laterality Date  . BRONCHIAL NEEDLE ASPIRATION BIOPSY N/A 12/31/2014   Procedure: BRONCHIAL NEEDLE ASPIRATION BIOPSIES from carina;  Surgeon: Flora Lipps, MD;  Location: [facility] ORS;  Service: Cardiopulmonary;  Laterality: N/A;  . CARDIAC CATHETERIZATION N/A 12/28/2015   Procedure: Left Heart Cath and Cors/Grafts Angiography;  Surgeon: Yolonda Kida, MD;  Location: [place] CV LAB;  Service: Cardiovascular;  Laterality: N/A;  . CORONARY ANGIOPLASTY WITH STENT PLACEMENT    . CORONARY ARTERY BYPASS GRAFT    . ENDOBRONCHIAL ULTRASOUND N/A 12/31/2014   Procedure: ENDOBRONCHIAL ULTRASOUND;  Surgeon: Flora Lipps, MD;  Location: [facility] ORS;  Service: Cardiopulmonary;  Laterality: N/A;  . ESOPHAGOGASTRODUODENOSCOPY (EGD) WITH PROPOFOL N/A  06/20/2015   Procedure: ESOPHAGOGASTRODUODENOSCOPY (EGD) WITH PROPOFOL;  Surgeon: Lollie Sails, MD;  Location: [facility] ENDOSCOPY;  Service: Endoscopy;  Laterality: N/A;    There were no vitals filed for this visit.      Subjective Assessment - 04/26/16 1650    Subjective Pt reports he is doing well on this date. No new complaints and no questions or concerns. Denies pain. Performing HEP or at least "trying to."   Patient is accompained by: Family member   Pertinent History R foot drop beginning in June of 2017 due to spinal stenosis. Hx of chronic foot ulceration, R foot/leg cellulitis due to AFO use.   Limitations Walking;Standing;House hold activities   How long can you stand comfortably? current standing tolerance without support 7 minutes   Patient Stated Goals pt would like to improve his strength, endurance and balance so he can return to normal activity level   Currently in Pain? No/denies        Objective:   Therapeutic Exercise:  SciFit Level 8 x 13 minutes (warm-up during history, 10 minutes unbilled)- consistent cadence/ no rest breaks.  Seated R ankle/gastroc stretches- no AFO during therex./ balance tasks; Sit to stand without UE support from chair with minimal assistance from therapist x 10; Standing hip ext and abduction with contralateral LE on Airex pad with min assist form therapist and unilateral UE support;  Neuromuscular Re-ed: Obstacle course in // bars without UE  support stepping over objects and up/down 6" step x multiple laps; [facility]one taps L/R with light UE assist 10x2 (mirror feedback). Forward/lateral walking in //-bars with no UE assist;  Pt response for medical necessity:  Pt will benefit from physical therapy services to improve overall balance and LE strength with emphasis on hip stability. Continues to demonstrate RLE buckling during ambulation                                PT Education - 04/26/16 1651    Education provided  Yes   Education Details Reinforced importance of HEP   Person(s) Educated Patient   Methods Explanation   Comprehension Verbalized understanding             PT Long Term Goals - 04/12/16 1818      PT LONG TERM GOAL #1   Title Pt will score >45/56 on BERG balance assessment to increase functional mobility and decrease fall risk   Baseline 11/29: 43/56   Time 4   Period Weeks   Status Partially Met     PT LONG TERM GOAL #2   Title Pt will complete LEFS outcome measure to assess current functional impairment with ADLs   Baseline 11/29: LEFS 22 out of 80   Time 4   Period Weeks   Status On-going     PT LONG TERM GOAL #3   Title Pt will increase LE strength 1/2 per MMT in hip flexion, knee extension, knee flexion to promote return to prior strength so that he can perform household duties independently   Baseline 11/29: MMT LE R/L hip flexion 4+/4+, knee extension 4+/4+, knee flexion 4/4+, dorsiflexion 1/3+, plantarflexion 4+/4+, hip abd/add not tested formally but [facility]   Time 4   Period Weeks   Status Partially Met     PT LONG TERM GOAL #4   Title Pt will perform sit<>stand with unilateral UE assist or hands on knees in clinic chair to progress toward previous level of function   Baseline benefits from B UE assist at this time (difficulty with low chair).   Time 4   Period Weeks   Status Not Met               Plan - 04/26/16 1652    Clinical Impression Statement Pt continues to demonstrate RLE buckling during single leg stance actvities. He struggles with obstacle course in parallel bars without UE support. Has difficulty understanding instructions for some exercises during session today. Pt encouraged to continue HEP and follow-up as scheduled.    Rehab Potential Good   Clinical Impairments Affecting Rehab Potential Positive: family support, PLOF. Negative: dx, permanent nerve damage   PT Frequency 2x / week   PT Duration 4 weeks   PT Treatment/Interventions  ADLs/Self Care Home Management;Biofeedback;Cryotherapy;Electrical Stimulation;Gait training;Stair training;Functional mobility training;Therapeutic activities;Therapeutic exercise;Balance training;Neuromuscular re-education;Patient/family education;Orthotic Fit/Training;Manual techniques   PT Next Visit Plan Static/dynamic balance beginning at partial tandem. Stepping tasks. Progressive strengthening and balance program.     PT Home Exercise Plan see pt instructions for detailed HEP. Continue as prescribed   Consulted and Agree with Plan of Care Patient;Family member/caregiver      Patient will benefit from skilled therapeutic intervention in order to improve the following deficits and impairments:  Abnormal gait, Decreased activity tolerance, Decreased balance, Decreased coordination, Decreased endurance, Decreased knowledge of use of DME, Decreased mobility, Decreased skin integrity, Decreased strength, Difficulty walking, Impaired perceived  functional ability, Postural dysfunction  Visit Diagnosis: Difficulty in walking, not elsewhere classified  History of falling     Problem List Patient Active Problem List   Diagnosis Date Noted  . Chronic systolic heart failure ([place]) 01/17/2016  . Chewing tobacco use 01/17/2016  . Atrial fibrillation with RVR ([place]) 12/23/2015  . Dyspnea on exertion 12/23/2015  . Elevated troponin 12/23/2015  . GERD (gastroesophageal reflux disease) 12/23/2015  . Other iron deficiency anemias 12/20/2015  . Femoral neck fracture, left, closed, initial encounter 11/03/2015  . Chronic atrial fibrillation ([place])   . Coronary artery disease involving native coronary artery of native heart without angina pectoris   . Acalculous cholecystitis 10/29/2015  . Adenopathy   . CAD (coronary artery disease) 12/22/2014  . HTN (hypertension) 12/22/2014   Phillips Grout PT, DPT   , 04/26/2016, 5:30 PM  [facility] [address] [place], [place], [address] Phone: [phone]   Fax:  [phone]  Name: Andres Gutierrez MRN: [id number] Date of Birth: 1930-09-04

## 2016-04-30 ENCOUNTER — Ambulatory Visit: Payer: PPO | Admitting: Physical Therapy

## 2016-04-30 DIAGNOSIS — R262 Difficulty in walking, not elsewhere classified: Secondary | ICD-10-CM | POA: Diagnosis not present

## 2016-04-30 DIAGNOSIS — Z9181 History of falling: Secondary | ICD-10-CM

## 2016-04-30 DIAGNOSIS — M21371 Foot drop, right foot: Secondary | ICD-10-CM

## 2016-05-01 ENCOUNTER — Ambulatory Visit: Payer: PPO | Admitting: Physical Therapy

## 2016-05-01 DIAGNOSIS — H0013 Chalazion right eye, unspecified eyelid: Secondary | ICD-10-CM | POA: Diagnosis not present

## 2016-05-01 NOTE — Therapy (Addendum)
[facility] [address] [place], [place], [address] Phone: [phone]   Fax:  [phone]  Physical Therapy Treatment  Patient Details  Name: Andres Gutierrez MRN: [id number] Date of Birth: 1930-06-06 Referring Provider: Emily Filbert MD  Encounter Date: 04/30/2016      PT End of Session - 05/01/16 1255    Visit Number 14   Number of Visits 16   Date for PT Re-Evaluation 05/09/16   Authorization - Visit Number 14   Authorization - Number of Visits 18   PT Start Time 5956   PT Stop Time 3875   PT Time Calculation (min) 64 min   Equipment Utilized During Treatment Gait belt   Activity Tolerance Patient tolerated treatment well;No increased pain   Behavior During Therapy WFL for tasks assessed/performed      Past Medical History:  Diagnosis Date  . Anemia   . Anxiety   . Atrial fibrillation ([place])   . Cardiomyopathy ([place])   . CHF (congestive heart failure) ([place])   . Chronic kidney disease    kidney stones  . Coronary artery disease   . Cough   . Dysrhythmia   . Hypertension   . Lymphadenopathy, hilar   . MI (myocardial infarction)    x 2  . Wheezing     Past Surgical History:  Procedure Laterality Date  . BRONCHIAL NEEDLE ASPIRATION BIOPSY N/A 12/31/2014   Procedure: BRONCHIAL NEEDLE ASPIRATION BIOPSIES from carina;  Surgeon: Flora Lipps, MD;  Location: [facility] ORS;  Service: Cardiopulmonary;  Laterality: N/A;  . CARDIAC CATHETERIZATION N/A 12/28/2015   Procedure: Left Heart Cath and Cors/Grafts Angiography;  Surgeon: Yolonda Kida, MD;  Location: [place] CV LAB;  Service: Cardiovascular;  Laterality: N/A;  . CORONARY ANGIOPLASTY WITH STENT PLACEMENT    . CORONARY ARTERY BYPASS GRAFT    . ENDOBRONCHIAL ULTRASOUND N/A 12/31/2014   Procedure: ENDOBRONCHIAL ULTRASOUND;  Surgeon: Flora Lipps, MD;  Location: [facility] ORS;  Service: Cardiopulmonary;  Laterality: N/A;  . ESOPHAGOGASTRODUODENOSCOPY (EGD) WITH PROPOFOL N/A  06/20/2015   Procedure: ESOPHAGOGASTRODUODENOSCOPY (EGD) WITH PROPOFOL;  Surgeon: Lollie Sails, MD;  Location: [facility] ENDOSCOPY;  Service: Endoscopy;  Laterality: N/A;    There were no vitals filed for this visit.      Subjective Assessment - 05/01/16 1254    Subjective Pt. states he is doing okay.  No new complaints.  Pt. states he is going to try QC again around home and will look to buy a QC soon.     Patient is accompained by: Family member   Pertinent History R foot drop beginning in June of 2017 due to spinal stenosis. Hx of chronic foot ulceration, R foot/leg cellulitis due to AFO use.   Limitations Walking;Standing;House hold activities   How long can you stand comfortably? current standing tolerance without support 7 minutes   Patient Stated Goals pt would like to improve his strength, endurance and balance so he can return to normal activity level   Currently in Pain? No/denies      Objective:  Therapeutic Exercise:  SciFit Level 8x 30mnutes- consistent cadence/ no rest breaks.  Seated R ankle/gastroc stretches- no AFO during therex./ balance tasks; Standing GTB hip ex. Program 10x2.  Seated ex. Program.   Standing hip ext and abduction with contralateral LE on Airex pad with min assist form therapist and unilateral UE support;  Neuromuscular Re-ed: Airex hip flexion/ abd./ step ups/ downs (min. To no UE assist).   [facility]  tapsL/R with light UE assist 10x2 (mirror feedback). Forward/lateral walking in //-bars with no UE assist;  Gait Training: QC walking in clinic with 2-point gait/ min. Cuing for proper technique.  Ascend stairs with recip. Pattern/ Descend with step to pattern for safety.  Discussed walking program at home.   Pt response for medical necessity: Pt will benefit from physical therapy services to improve overall balance and LE strength with emphasis on hip stability. Pt. Independent with ex. Program and will continue on a consistent basis.         Pt. ambulates with improved gait pattern/ knee control with use of RW.  Pt. able to ambulate short distances with use of QC but feels less confident at home.  Pt. is planning on continuing with a more independent HEP and will contact PT in a week or 2 to determine how HEP is going.  Pt. will not be discharged at this time.  Pt./dtr. agree with POC and will continue to progress.           PT Long Term Goals - 04/12/16 1818      PT LONG TERM GOAL #1   Title Pt will score >45/56 on BERG balance assessment to increase functional mobility and decrease fall risk   Baseline 11/29: 43/56   Time 4   Period Weeks   Status Partially Met     PT LONG TERM GOAL #2   Title Pt will complete LEFS outcome measure to assess current functional impairment with ADLs   Baseline 11/29: LEFS 22 out of 80   Time 4   Period Weeks   Status On-going     PT LONG TERM GOAL #3   Title Pt will increase LE strength 1/2 per MMT in hip flexion, knee extension, knee flexion to promote return to prior strength so that he can perform household duties independently   Baseline 11/29: MMT LE R/L hip flexion 4+/4+, knee extension 4+/4+, knee flexion 4/4+, dorsiflexion 1/3+, plantarflexion 4+/4+, hip abd/add not tested formally but [facility]   Time 4   Period Weeks   Status Partially Met     PT LONG TERM GOAL #4   Title Pt will perform sit<>stand with unilateral UE assist or hands on knees in clinic chair to progress toward previous level of function   Baseline benefits from B UE assist at this time (difficulty with low chair).   Time 4   Period Weeks   Status Not Met               Plan - 05/01/16 1256    Rehab Potential Good   Clinical Impairments Affecting Rehab Potential Positive: family support, PLOF. Negative: dx, permanent nerve damage   PT Frequency 2x / week   PT Duration 4 weeks   PT Treatment/Interventions ADLs/Self Care Home Management;Biofeedback;Cryotherapy;Electrical Stimulation;Gait  training;Stair training;Functional mobility training;Therapeutic activities;Therapeutic exercise;Balance training;Neuromuscular re-education;Patient/family education;Orthotic Fit/Training;Manual techniques   PT Next Visit Plan Static/dynamic balance beginning at partial tandem. Stepping tasks. Progressive strengthening and balance program.     PT Home Exercise Plan see pt instructions for detailed HEP. Continue as prescribed   Consulted and Agree with Plan of Care Patient;Family member/caregiver      Patient will benefit from skilled therapeutic intervention in order to improve the following deficits and impairments:  Abnormal gait, Decreased activity tolerance, Decreased balance, Decreased coordination, Decreased endurance, Decreased knowledge of use of DME, Decreased mobility, Decreased skin integrity, Decreased strength, Difficulty walking, Impaired perceived functional ability, Postural dysfunction  Visit  Diagnosis: Difficulty in walking, not elsewhere classified  History of falling  Foot drop, right     Problem List Patient Active Problem List   Diagnosis Date Noted  . Chronic systolic heart failure ([place]) 01/17/2016  . Chewing tobacco use 01/17/2016  . Atrial fibrillation with RVR ([place]) 12/23/2015  . Dyspnea on exertion 12/23/2015  . Elevated troponin 12/23/2015  . GERD (gastroesophageal reflux disease) 12/23/2015  . Other iron deficiency anemias 12/20/2015  . Femoral neck fracture, left, closed, initial encounter 11/03/2015  . Chronic atrial fibrillation ([place])   . Coronary artery disease involving native coronary artery of native heart without angina pectoris   . Acalculous cholecystitis 10/29/2015  . Adenopathy   . CAD (coronary artery disease) 12/22/2014  . HTN (hypertension) 12/22/2014   Pura Spice, PT, DPT # [id number] 05/01/2016, 12:57 PM  [facility] [address] [place], [place], [address] Phone: [phone]   Fax:   [phone]  Name: Andres Gutierrez MRN: [id number] Date of Birth: November 26, 1930

## 2016-05-03 ENCOUNTER — Ambulatory Visit: Payer: PPO | Admitting: Physical Therapy

## 2016-05-16 DIAGNOSIS — I482 Chronic atrial fibrillation: Secondary | ICD-10-CM | POA: Diagnosis not present

## 2016-05-16 DIAGNOSIS — K2210 Ulcer of esophagus without bleeding: Secondary | ICD-10-CM | POA: Diagnosis not present

## 2016-05-16 DIAGNOSIS — E538 Deficiency of other specified B group vitamins: Secondary | ICD-10-CM | POA: Diagnosis not present

## 2016-05-24 ENCOUNTER — Ambulatory Visit: Payer: PPO | Admitting: Family

## 2016-05-28 ENCOUNTER — Ambulatory Visit: Payer: PPO | Attending: Internal Medicine | Admitting: Physical Therapy

## 2016-05-28 DIAGNOSIS — Z9181 History of falling: Secondary | ICD-10-CM | POA: Diagnosis not present

## 2016-05-28 DIAGNOSIS — R262 Difficulty in walking, not elsewhere classified: Secondary | ICD-10-CM | POA: Insufficient documentation

## 2016-05-28 DIAGNOSIS — M21371 Foot drop, right foot: Secondary | ICD-10-CM

## 2016-05-29 NOTE — Therapy (Addendum)
[facility] [address] [place], [place], [address] Phone: [phone]   Fax:  [phone]  Physical Therapy Treatment  Patient Details  Name: Andres Gutierrez MRN: [id number] Date of Birth: April 13, 1931 Referring Provider: Emily Filbert MD  Encounter Date: 05/28/2016  Treatment 15 of 22.    Past Medical History:  Diagnosis Date  . Anemia   . Anxiety   . Atrial fibrillation ([place])   . Cardiomyopathy ([place])   . CHF (congestive heart failure) ([place])   . Chronic kidney disease    kidney stones  . Coronary artery disease   . Cough   . Dysrhythmia   . Hypertension   . Lymphadenopathy, hilar   . MI (myocardial infarction)    x 2  . Wheezing     Past Surgical History:  Procedure Laterality Date  . BRONCHIAL NEEDLE ASPIRATION BIOPSY N/A 12/31/2014   Procedure: BRONCHIAL NEEDLE ASPIRATION BIOPSIES from carina;  Surgeon: Flora Lipps, MD;  Location: [facility] ORS;  Service: Cardiopulmonary;  Laterality: N/A;  . CARDIAC CATHETERIZATION N/A 12/28/2015   Procedure: Left Heart Cath and Cors/Grafts Angiography;  Surgeon: Yolonda Kida, MD;  Location: [place] [facility];  Service: Cardiovascular;  Laterality: N/A;  . CORONARY ANGIOPLASTY WITH STENT PLACEMENT    . CORONARY ARTERY BYPASS GRAFT    . ENDOBRONCHIAL ULTRASOUND N/A 12/31/2014   Procedure: ENDOBRONCHIAL ULTRASOUND;  Surgeon: Flora Lipps, MD;  Location: [facility] ORS;  Service: Cardiopulmonary;  Laterality: N/A;  . ESOPHAGOGASTRODUODENOSCOPY (EGD) WITH PROPOFOL N/A 06/20/2015   Procedure: ESOPHAGOGASTRODUODENOSCOPY (EGD) WITH PROPOFOL;  Surgeon: Lollie Sails, MD;  Location: [facility] ENDOSCOPY;  Service: Endoscopy;  Laterality: N/A;    There were no vitals filed for this visit.    Pt. referred back to PT after several weeks of trying HEP on an independent basis.  Pt./dtr. report poor compliance with HEP.  Pt. limited with activity and pts. dtr. notices a decline in status.      Objective:  Therapeutic Exercise:  SciFit Level 8x 19mnutes- consistent cadence/ no rest breaks (no charge).  Standing/Seated hip flexion/ ext./ abd./ knee flexion/ partial squats ex. Program 10x2 each.   Step ups/downs 10x each with min. UE assist;  Reviewed HEP in depth.    Neuromuscular Re-ed: Berg balance test: 47/56. Walking in hallway with QC/ wt. Shifting with consistent arm swing.   Turning CW/CCW to L/R with no UE assist (slow but controlled).   Step touches with L/R heel and min. To no UE assist.     Pt response for medical necessity: Pt will benefit from physical therapy services to improve overall balance and LE strength with emphasis on hip stability. Pt. Independent with ex. Program and will continue on a consistent basis.          PT Long Term Goals - 05/30/16 1840      PT LONG TERM GOAL #1   Title Pt will score >45/56 on BERG balance assessment to increase functional mobility and decrease fall risk   Baseline 1/15: 47/56   Time 4   Period Weeks   Status Achieved     PT LONG TERM GOAL #2   Title Pt will complete LEFS outcome measure and score >40 out of 80 to improve functional mobility/ ADLs.   Baseline 1/15: LEFS 33 out of 80   Time 4   Period Weeks   Status Partially Met     PT LONG TERM GOAL #3   Title Pt will increase LE  strength 1/2 per MMT in hip flexion, knee extension, knee flexion to promote return to prior strength so that he can perform household duties independently   Baseline B LE muscle weakness: B hip flexion (3+/5 MMT), B hip ext./IR (4-/5 MMT), R knee ext. (4-/5 MMT), L knee ext. (4+/5 MMT), R DF (1/5 MMT), L DF (4+/5 MMT), B knee flexion (4+/5 MMT).    Time 4   Period Weeks   Status Partially Met     PT LONG TERM GOAL #4   Title Pt will perform sit<>stand with unilateral UE assist or hands on knees in clinic chair to progress toward previous level of function   Baseline benefits from B UE assist at this time (difficulty with low  chair).   Time 4   Period Weeks   Status Not Met     PT LONG TERM GOAL #5   Title Pt will score >50/56 on BERG balance assessment to increase functional mobility and decrease fall risk   Baseline 1/15: Berg 47/56   Time 4   Period Weeks   Status New               Plan - 05/30/16 1831    Clinical Impression Statement Pt. presents with B LE muscle weakness: B hip flexion (3+/5 MMT), B hip ext./IR (4-/5 MMT), R knee ext. (4-/5 MMT), L knee ext. (4+/5 MMT), R DF (1/5 MMT), L DF (4+/5 MMT), B knee flexion (4+/5 MMT).  Berg balance test: 47/56.  Pt. demonstrates ability to progress to use of QC with R AFO for short distances at home.  Pt. still benefits from use of RW with outside/ uneven terrain.  LEFS: 33 out of 80.  Moderate LE muscle fatigue with prolonged standing/ ther.ex. today.  Pt. will benefit from short-term continuation of PT services to improve safety/ functional mobility     Rehab Potential Good   Clinical Impairments Affecting Rehab Potential Positive: family support, PLOF. Negative: dx, permanent nerve damage   PT Frequency 2x / week   PT Duration 4 weeks   PT Treatment/Interventions ADLs/Self Care Home Management;Biofeedback;Cryotherapy;Electrical Stimulation;Gait training;Stair training;Functional mobility training;Therapeutic activities;Therapeutic exercise;Balance training;Neuromuscular re-education;Patient/family education;Orthotic Fit/Training;Manual techniques   PT Next Visit Plan Progressive strengthening and balance program.   Gait training to QC/ least assistive device.     PT Home Exercise Plan see pt instructions for detailed HEP. Continue as prescribed   Consulted and Agree with Plan of Care Patient;Family member/caregiver      Patient will benefit from skilled therapeutic intervention in order to improve the following deficits and impairments:  Abnormal gait, Decreased activity tolerance, Decreased balance, Decreased coordination, Decreased endurance,  Decreased knowledge of use of DME, Decreased mobility, Decreased skin integrity, Decreased strength, Difficulty walking, Impaired perceived functional ability, Postural dysfunction  Visit Diagnosis: Difficulty in walking, not elsewhere classified  History of falling  Foot drop, right     Problem List Patient Active Problem List   Diagnosis Date Noted  . Chronic systolic heart failure ([place]) 01/17/2016  . Chewing tobacco use 01/17/2016  . Atrial fibrillation with RVR ([place]) 12/23/2015  . Dyspnea on exertion 12/23/2015  . Elevated troponin 12/23/2015  . GERD (gastroesophageal reflux disease) 12/23/2015  . Other iron deficiency anemias 12/20/2015  . Femoral neck fracture, left, closed, initial encounter 11/03/2015  . Chronic atrial fibrillation ([place])   . Coronary artery disease involving native coronary artery of native heart without angina pectoris   . Acalculous cholecystitis 10/29/2015  . Adenopathy   .  CAD (coronary artery disease) 12/22/2014  . HTN (hypertension) 12/22/2014   Pura Spice, PT, DPT # [id number] 05/30/2016, 6:47 PM  [facility] [address] [place], [place], [address] Phone: [phone]   Fax:  [phone]  Name: Andres Gutierrez MRN: [id number] Date of Birth: 1930/05/22

## 2016-05-30 ENCOUNTER — Encounter: Payer: PPO | Admitting: Physical Therapy

## 2016-05-30 NOTE — Addendum Note (Signed)
Addended by: Pura Spice on: 05/30/2016 07:01 PM   Modules accepted: Orders

## 2016-05-31 ENCOUNTER — Ambulatory Visit: Payer: PPO | Admitting: Family

## 2016-06-01 ENCOUNTER — Encounter: Payer: PPO | Admitting: Physical Therapy

## 2016-06-05 DIAGNOSIS — I255 Ischemic cardiomyopathy: Secondary | ICD-10-CM | POA: Diagnosis not present

## 2016-06-05 DIAGNOSIS — I25810 Atherosclerosis of coronary artery bypass graft(s) without angina pectoris: Secondary | ICD-10-CM | POA: Diagnosis not present

## 2016-06-05 DIAGNOSIS — I482 Chronic atrial fibrillation: Secondary | ICD-10-CM | POA: Diagnosis not present

## 2016-06-05 DIAGNOSIS — I10 Essential (primary) hypertension: Secondary | ICD-10-CM | POA: Diagnosis not present

## 2016-06-05 DIAGNOSIS — E782 Mixed hyperlipidemia: Secondary | ICD-10-CM | POA: Diagnosis not present

## 2016-06-06 ENCOUNTER — Ambulatory Visit: Payer: PPO | Admitting: Physical Therapy

## 2016-06-06 DIAGNOSIS — Z9181 History of falling: Secondary | ICD-10-CM

## 2016-06-06 DIAGNOSIS — M21371 Foot drop, right foot: Secondary | ICD-10-CM

## 2016-06-06 DIAGNOSIS — R262 Difficulty in walking, not elsewhere classified: Secondary | ICD-10-CM

## 2016-06-07 ENCOUNTER — Encounter: Payer: Self-pay | Admitting: Physical Therapy

## 2016-06-07 NOTE — Therapy (Addendum)
[facility] [address] [place], [place], [address] Phone: [phone]   Fax:  [phone]  Physical Therapy Treatment  Patient Details  Name: Andres Gutierrez MRN: [id number] Date of Birth: 1930-08-21 Referring Provider: Emily Filbert MD  Encounter Date: 06/06/2016      PT End of Session - 06/07/16 1738    Visit Number 16   Number of Visits 22   Date for PT Re-Evaluation 06/25/16   Authorization - Visit Number 2   Authorization - Number of Visits 10   PT Start Time 2703   PT Stop Time 1649   PT Time Calculation (min) 52 min   Equipment Utilized During Treatment Gait belt   Activity Tolerance Patient tolerated treatment well;No increased pain   Behavior During Therapy WFL for tasks assessed/performed      Past Medical History:  Diagnosis Date  . Anemia   . Anxiety   . Atrial fibrillation ([place])   . Cardiomyopathy ([place])   . CHF (congestive heart failure) ([place])   . Chronic kidney disease    kidney stones  . Coronary artery disease   . Cough   . Dysrhythmia   . Hypertension   . Lymphadenopathy, hilar   . MI (myocardial infarction)    x 2  . Wheezing     Past Surgical History:  Procedure Laterality Date  . BRONCHIAL NEEDLE ASPIRATION BIOPSY N/A 12/31/2014   Procedure: BRONCHIAL NEEDLE ASPIRATION BIOPSIES from carina;  Surgeon: Flora Lipps, MD;  Location: [facility] ORS;  Service: Cardiopulmonary;  Laterality: N/A;  . CARDIAC CATHETERIZATION N/A 12/28/2015   Procedure: Left Heart Cath and Cors/Grafts Angiography;  Surgeon: Yolonda Kida, MD;  Location: [place] [facility];  Service: Cardiovascular;  Laterality: N/A;  . CORONARY ANGIOPLASTY WITH STENT PLACEMENT    . CORONARY ARTERY BYPASS GRAFT    . ENDOBRONCHIAL ULTRASOUND N/A 12/31/2014   Procedure: ENDOBRONCHIAL ULTRASOUND;  Surgeon: Flora Lipps, MD;  Location: [facility] ORS;  Service: Cardiopulmonary;  Laterality: N/A;  . ESOPHAGOGASTRODUODENOSCOPY (EGD) WITH PROPOFOL N/A  06/20/2015   Procedure: ESOPHAGOGASTRODUODENOSCOPY (EGD) WITH PROPOFOL;  Surgeon: Lollie Sails, MD;  Location: [facility] ENDOSCOPY;  Service: Endoscopy;  Laterality: N/A;    There were no vitals filed for this visit.      Subjective Assessment - 06/07/16 1736    Patient is accompained by: Family member   Limitations Walking;Standing;House hold activities   How long can you stand comfortably? current standing tolerance without support 7 minutes   Patient Stated Goals pt would like to improve his strength, endurance and balance so he can return to normal activity level      Pt. states he is doing alright and no falls or LOB reported.  Pt. entered PT with RW and without use of AFO today.      Objective:  Therapeutic Exercise:  SciFit Level 8x 15 minutes- consistent cadence/ no rest breaks (no charge).  Standing/Seated hip flexion/ ext./ abd./ knee flexion/ partial squats ex. Program 10x2 each.  Step ups/downs 10x each with min. UE assist; Standing partial lunges 10x2 each.      Neuromuscular Re-ed: [place]/ step touches in //-bars progressing from B UE to no UE assist (CGA to min. A required).  Walking in hallway with QC/ wt. Shifting with consistent arm swing.   Turning CW/CCW to L/R with no UE assist (slow but controlled).   Functional reaching (varying height/ distances with SPT assist).   Airex step ups/ marching/ lateral step touches with  min. To no UE assist.     Pt response for medical necessity: Pt will benefit from physical therapy services to improve overall balance and LE strength with emphasis on hip stability. Pt. Independent with ex. Program and will continue on a consistent basis.      Pt. demonstrates good technique with HEP since reassessment last week.  Pt. reports compliance and has no questions about HEP.  Pt. has difficulty with step/cone touches without use of UE on //-bars or RW.  Pt. improving generalized standing/walking endurance but weakness remains.   Pt. will continue to benefit from use of RW at this time with goal to progress to QC or [facility] in future for home mobility.          PT Long Term Goals - 05/30/16 1840      PT LONG TERM GOAL #1   Title Pt will score >45/56 on BERG balance assessment to increase functional mobility and decrease fall risk   Baseline 1/15: 47/56   Time 4   Period Weeks   Status Achieved     PT LONG TERM GOAL #2   Title Pt will complete LEFS outcome measure and score >40 out of 80 to improve functional mobility/ ADLs.   Baseline 1/15: LEFS 33 out of 80   Time 4   Period Weeks   Status Partially Met     PT LONG TERM GOAL #3   Title Pt will increase LE strength 1/2 per MMT in hip flexion, knee extension, knee flexion to promote return to prior strength so that he can perform household duties independently   Baseline B LE muscle weakness: B hip flexion (3+/5 MMT), B hip ext./IR (4-/5 MMT), R knee ext. (4-/5 MMT), L knee ext. (4+/5 MMT), R DF (1/5 MMT), L DF (4+/5 MMT), B knee flexion (4+/5 MMT).    Time 4   Period Weeks   Status Partially Met     PT LONG TERM GOAL #4   Title Pt will perform sit<>stand with unilateral UE assist or hands on knees in clinic chair to progress toward previous level of function   Baseline benefits from B UE assist at this time (difficulty with low chair).   Time 4   Period Weeks   Status Not Met     PT LONG TERM GOAL #5   Title Pt will score >50/56 on BERG balance assessment to increase functional mobility and decrease fall risk   Baseline 1/15: [name] 47/56   Time 4   Period Weeks   Status New            Plan - 06/07/16 1739    Rehab Potential Good   Clinical Impairments Affecting Rehab Potential Positive: family support, PLOF. Negative: dx, permanent nerve damage   PT Frequency 2x / week   PT Duration 4 weeks   PT Treatment/Interventions ADLs/Self Care Home Management;Biofeedback;Cryotherapy;Electrical Stimulation;Gait training;Stair training;Functional mobility  training;Therapeutic activities;Therapeutic exercise;Balance training;Neuromuscular re-education;Patient/family education;Orthotic Fit/Training;Manual techniques   PT Next Visit Plan Progressive strengthening and balance program.   Gait training to QC/ least assistive device.     PT Home Exercise Plan see pt instructions for detailed HEP. Continue as prescribed   Consulted and Agree with Plan of Care Patient;Family member/caregiver      Patient will benefit from skilled therapeutic intervention in order to improve the following deficits and impairments:  Abnormal gait, Decreased activity tolerance, Decreased balance, Decreased coordination, Decreased endurance, Decreased knowledge of use of DME, Decreased mobility, Decreased skin integrity,  Decreased strength, Difficulty walking, Impaired perceived functional ability, Postural dysfunction  Visit Diagnosis: Difficulty in walking, not elsewhere classified  History of falling  Foot drop, right     Problem List Patient Active Problem List   Diagnosis Date Noted  . Chronic systolic heart failure ([place]) 01/17/2016  . Chewing tobacco use 01/17/2016  . Atrial fibrillation with RVR ([place]) 12/23/2015  . Dyspnea on exertion 12/23/2015  . Elevated troponin 12/23/2015  . GERD (gastroesophageal reflux disease) 12/23/2015  . Other iron deficiency anemias 12/20/2015  . Femoral neck fracture, left, closed, initial encounter 11/03/2015  . Chronic atrial fibrillation ([place])   . Coronary artery disease involving native coronary artery of native heart without angina pectoris   . Acalculous cholecystitis 10/29/2015  . Adenopathy   . CAD (coronary artery disease) 12/22/2014  . HTN (hypertension) 12/22/2014   Pura Spice, PT, DPT # [id number] 06/07/2016, 5:40 PM  [facility] [address] [place], [place], [address] Phone: [phone]   Fax:  [phone]  Name: BRYKER FLETCHALL MRN: [id number] Date of  Birth: [birth year]

## 2016-06-13 ENCOUNTER — Ambulatory Visit: Payer: PPO | Admitting: Physical Therapy

## 2016-06-13 ENCOUNTER — Ambulatory Visit: Payer: PPO | Attending: Family | Admitting: Family

## 2016-06-13 ENCOUNTER — Encounter: Payer: Self-pay | Admitting: Family

## 2016-06-13 VITALS — BP 146/61 | HR 63 | Resp 18 | Ht 76.0 in | Wt 209.0 lb

## 2016-06-13 DIAGNOSIS — F419 Anxiety disorder, unspecified: Secondary | ICD-10-CM | POA: Diagnosis not present

## 2016-06-13 DIAGNOSIS — Z800 Family history of malignant neoplasm of digestive organs: Secondary | ICD-10-CM | POA: Diagnosis not present

## 2016-06-13 DIAGNOSIS — I429 Cardiomyopathy, unspecified: Secondary | ICD-10-CM | POA: Diagnosis not present

## 2016-06-13 DIAGNOSIS — I252 Old myocardial infarction: Secondary | ICD-10-CM | POA: Diagnosis not present

## 2016-06-13 DIAGNOSIS — Z8249 Family history of ischemic heart disease and other diseases of the circulatory system: Secondary | ICD-10-CM | POA: Insufficient documentation

## 2016-06-13 DIAGNOSIS — Z955 Presence of coronary angioplasty implant and graft: Secondary | ICD-10-CM | POA: Insufficient documentation

## 2016-06-13 DIAGNOSIS — I5022 Chronic systolic (congestive) heart failure: Secondary | ICD-10-CM | POA: Diagnosis not present

## 2016-06-13 DIAGNOSIS — Z888 Allergy status to other drugs, medicaments and biological substances status: Secondary | ICD-10-CM | POA: Diagnosis not present

## 2016-06-13 DIAGNOSIS — R262 Difficulty in walking, not elsewhere classified: Secondary | ICD-10-CM | POA: Diagnosis not present

## 2016-06-13 DIAGNOSIS — Z951 Presence of aortocoronary bypass graft: Secondary | ICD-10-CM | POA: Insufficient documentation

## 2016-06-13 DIAGNOSIS — Z87891 Personal history of nicotine dependence: Secondary | ICD-10-CM | POA: Insufficient documentation

## 2016-06-13 DIAGNOSIS — M21371 Foot drop, right foot: Secondary | ICD-10-CM

## 2016-06-13 DIAGNOSIS — Z720 Tobacco use: Secondary | ICD-10-CM

## 2016-06-13 DIAGNOSIS — I10 Essential (primary) hypertension: Secondary | ICD-10-CM

## 2016-06-13 DIAGNOSIS — Z7982 Long term (current) use of aspirin: Secondary | ICD-10-CM | POA: Diagnosis not present

## 2016-06-13 DIAGNOSIS — I130 Hypertensive heart and chronic kidney disease with heart failure and stage 1 through stage 4 chronic kidney disease, or unspecified chronic kidney disease: Secondary | ICD-10-CM | POA: Insufficient documentation

## 2016-06-13 DIAGNOSIS — I482 Chronic atrial fibrillation, unspecified: Secondary | ICD-10-CM

## 2016-06-13 DIAGNOSIS — F17220 Nicotine dependence, chewing tobacco, uncomplicated: Secondary | ICD-10-CM | POA: Diagnosis not present

## 2016-06-13 DIAGNOSIS — Z87442 Personal history of urinary calculi: Secondary | ICD-10-CM | POA: Diagnosis not present

## 2016-06-13 DIAGNOSIS — Z79899 Other long term (current) drug therapy: Secondary | ICD-10-CM | POA: Insufficient documentation

## 2016-06-13 DIAGNOSIS — Z9181 History of falling: Secondary | ICD-10-CM

## 2016-06-13 DIAGNOSIS — N189 Chronic kidney disease, unspecified: Secondary | ICD-10-CM | POA: Diagnosis not present

## 2016-06-13 DIAGNOSIS — I2510 Atherosclerotic heart disease of native coronary artery without angina pectoris: Secondary | ICD-10-CM | POA: Insufficient documentation

## 2016-06-13 NOTE — Patient Instructions (Addendum)
Continue weighing daily and call for an overnight weight gain of > 2 pounds or a weekly weight gain of >5 pounds. 

## 2016-06-13 NOTE — Progress Notes (Signed)
Patient ID: Andres Gutierrez, male    DOB: Jul 26, 1930, 83 y.o.   MRN: [id number]  HPI  Andres Gutierrez is a 83 y/o male with a history of MI, HTN, CAD, CKD, atrial fibrillation, anxiety, anemia, remote tobacco use and chronic heart failure.  Last echo was done 12/24/15 and showed an EF of 20-25% with trivial AR and mild Andres. Unchanged from previous echo done June 2017. Cardiac cath was done 12/28/15 Conclusion:   Successful cardiac catheter patent grafts with lesion to the insertion of the ramus   Recommend functional study prior to consideration for intervention   Aggressive medical therapy in the meanwhile.   Case discussed primary cardiology Dr. Ubaldo Glassing and interventionalists at [facility] Dr. Jerelene Redden as well as the patient and family  Was admitted to [facility] on 01/24/16 with anemia resulting in 1 unit of PRBC's given. Was cathed showing patent grafts and 75% lesion to the insertion of the ramus. Was discharged to [facility] and is now back at home receiving physical therapy at home.  Patient presents today for a follow-up visit without any fatigue and shortness of breath upon exertion. Does have some swelling in his right lower leg which is chronic in nature. Admits to not elevating his legs much during the day.  Has been weighing himself daily at home and says that his home weight has slowly risen as his appetite has increased. Continues with outpatient physical therapy twice a week for now. Does wear a brace on his right lower leg due to spinal stenosis.   Past Medical History:  Diagnosis Date  . Anemia   . Anxiety   . Atrial fibrillation ([place])   . Cardiomyopathy ([place])   . CHF (congestive heart failure) ([place])   . Chronic kidney disease    kidney stones  . Coronary artery disease   . Cough   . Dysrhythmia   . Hypertension   . Lymphadenopathy, hilar   . MI (myocardial infarction)    x 2  . Wheezing    Past Surgical History:  Procedure Laterality Date  . BRONCHIAL NEEDLE ASPIRATION BIOPSY N/A 12/31/2014    Procedure: BRONCHIAL NEEDLE ASPIRATION BIOPSIES from carina;  Surgeon: Flora Lipps, MD;  Location: [facility] ORS;  Service: Cardiopulmonary;  Laterality: N/A;  . CARDIAC CATHETERIZATION N/A 12/28/2015   Procedure: Left Heart Cath and Cors/Grafts Angiography;  Surgeon: Yolonda Kida, MD;  Location: [place] CV LAB;  Service: Cardiovascular;  Laterality: N/A;  . CORONARY ANGIOPLASTY WITH STENT PLACEMENT    . CORONARY ARTERY BYPASS GRAFT    . ENDOBRONCHIAL ULTRASOUND N/A 12/31/2014   Procedure: ENDOBRONCHIAL ULTRASOUND;  Surgeon: Flora Lipps, MD;  Location: [facility] ORS;  Service: Cardiopulmonary;  Laterality: N/A;  . ESOPHAGOGASTRODUODENOSCOPY (EGD) WITH PROPOFOL N/A 06/20/2015   Procedure: ESOPHAGOGASTRODUODENOSCOPY (EGD) WITH PROPOFOL;  Surgeon: Lollie Sails, MD;  Location: [facility] ENDOSCOPY;  Service: Endoscopy;  Laterality: N/A;   Family History  Problem Relation Age of Onset  . CAD Mother   . Colon cancer Father    Social History  Substance Use Topics  . Smoking status: Former Smoker    Quit date: 06/27/1977  . Smokeless tobacco: Current User    Types: Chew  . Alcohol use Yes     Comment: occational almost rare once a year   Allergies  Allergen Reactions  . 5ht3 Receptor Antagonists   . Diphenhydramine Hcl Other (See Comments)    Reaction:  Rash and fever a long time ago, but has had it since with no  problem.  . Escitalopram Other (See Comments)    Reaction:  Makes him feel faint, like he was going to have a heart attack.  . Maxidex [Dexamethasone] Other (See Comments)    Reaction:  Unknown   . Prednisone Other (See Comments)    Pt states that this medication made him feel crazy.    . Serotonin Other (See Comments)    Tried 2 different types, lexapro and another one.  Reaction:  Made him feel like he was having a heart attack.   . Vytorin [Ezetimibe-Simvastatin] Other (See Comments)    Reaction:  Unknown   . Zocor [Simvastatin] Other (See Comments)    Reaction:  Unknown     Prior to Admission medications   Medication Sig Start Date End Date Taking? Authorizing Provider  aspirin EC 81 MG tablet Take 81 mg by mouth daily.   Yes Historical Provider, MD  atorvastatin (LIPITOR) 40 MG tablet Take 1 tablet (40 mg total) by mouth daily at 6 PM. 12/30/15  Yes Dustin Flock, MD  digoxin (LANOXIN) 0.125 MG tablet Take 0.0625 mg by mouth daily.   Yes Historical Provider, MD  ferrous sulfate 325 (65 FE) MG tablet Take 1 tablet (325 mg total) by mouth daily. Patient taking differently: Take 325 mg by mouth every other day.  11/01/15  Yes Dustin Flock, MD  furosemide (LASIX) 20 MG tablet Take 20 mg by mouth as needed. For weight gain greater than 2 pounds in 24 hours or greater than 5 pounds in one week   Yes Historical Provider, MD  linaclotide (LINZESS) 72 MCG capsule Take 1 capsule (72 mcg total) by mouth daily before breakfast. 01/27/16  Yes Orson Eva, MD  metoprolol tartrate (LOPRESSOR) 12.5 mg TABS tablet Take 12.5 mg by mouth 2 (two) times daily.   Yes Historical Provider, MD  nitroGLYCERIN (NITROSTAT) 0.4 MG SL tablet Place 0.4 mg under the tongue every 5 (five) minutes as needed for chest pain.   Yes Historical Provider, MD  pantoprazole (PROTONIX) 40 MG tablet Take 40 mg by mouth 2 (two) times daily before a meal.    Yes Historical Provider, MD  potassium chloride (K-DUR) 10 MEQ tablet Take 10 mEq by mouth 2 (two) times daily.   Yes Historical Provider, MD  ranolazine (RANEXA) 500 MG 12 hr tablet Take 1 tablet (500 mg total) by mouth 2 (two) times daily. 12/30/15  Yes Dustin Flock, MD  senna (SENOKOT) 8.6 MG tablet Take 1 tablet by mouth at bedtime as needed for constipation.   Yes Historical Provider, MD  sucralfate (CARAFATE) 1 G tablet Take 1 g by mouth 2 (two) times daily.    Yes Historical Provider, MD  tamsulosin (FLOMAX) 0.4 MG CAPS capsule Take 0.4 mg by mouth daily after supper.    Yes Historical Provider, MD  vitamin C (VITAMIN C) 250 MG tablet Take 1 tablet  (250 mg total) by mouth daily. 11/01/15  Yes Dustin Flock, MD    Review of Systems  Constitutional: Negative for appetite change and fatigue.  HENT: Positive for hearing loss. Negative for congestion and sore throat.   Eyes: Negative for pain and redness.  Respiratory: Negative for chest tightness and shortness of breath.   Cardiovascular: Positive for leg swelling (right lower leg chronically). Negative for chest pain and palpitations.  Gastrointestinal: Negative for abdominal distention and abdominal pain.  Endocrine: Negative.   Genitourinary: Negative.   Musculoskeletal: Negative for back pain and neck pain.  Skin: Negative.   Allergic/Immunologic: Negative.  Neurological: Negative for dizziness and light-headedness.  Hematological: Negative for adenopathy. Does not bruise/bleed easily.  Psychiatric/Behavioral: Negative for dysphoric mood, sleep disturbance (sleeping on 1 pillow) and suicidal ideas. The patient is not nervous/anxious.    Vitals:   06/13/16 1028  BP: (!) 146/61  Pulse: 63  Resp: 18  SpO2: 100%  Weight: 209 lb (94.8 kg)  Height: 6\' 4"  (1.93 m)   Wt Readings from Last 3 Encounters:  06/13/16 209 lb (94.8 kg)  03/22/16 195 lb (88.5 kg)  02/23/16 187 lb (84.8 kg)   Lab Results  Component Value Date   CREATININE 1.09 01/26/2016   CREATININE 1.22 01/25/2016   CREATININE 1.27 (H) 01/24/2016    Physical Exam  Constitutional: He is oriented to person, place, and time. He appears well-developed and well-nourished.  HENT:  Head: Normocephalic and atraumatic.  Right Ear: Decreased hearing is noted.  Left Ear: Decreased hearing is noted.  Eyes: Conjunctivae are normal. Pupils are equal, round, and reactive to light.  Neck: Normal range of motion. Neck supple. No JVD present.  Cardiovascular: Normal rate and regular rhythm.   Pulmonary/Chest: Effort normal. He has no wheezes. He has no rales.  Abdominal: Soft. He exhibits no distension. There is no  tenderness.  Musculoskeletal: He exhibits edema (trace edema in right lower leg). He exhibits no tenderness.  Neurological: He is alert and oriented to person, place, and time.  Skin: Skin is warm and dry.  Psychiatric: He has a normal mood and affect. His behavior is normal. Thought content normal.  Nursing note and vitals reviewed.    Assessment & Plan:  1: Chronic heart failure with reduced ejection fraction- - NYHA Class I - euvolemic - continue weighing daily and take furosemide for an overnight weight gain of >2 pounds/weekly weight gain of >5 pounds. Weight up 14 pounds since he was last here - Elevate legs when sitting for long periods.  - Twice weekly outpatient physical therapy; walking with a walker - currently class I so no indication for entresto - saw cardiologist Ubaldo Glassing) 06/05/16 - received flu vaccine for this season  2: HTN- - BP looks good - Taking furosemide as needed - sees PCP Sabra Heck) 11/27/16  3: Chronic atrial fibrillation- - Not on eliquis due to history of GI bleed with recent admission for this September 2017. - No chest pain - Continues on metoprolol and baby aspirin  4: Chewing tobacco- - Continues to chew tobacco and not interested in quitting  Patient did not bring his medications nor a list. Each medication was verbally reviewed with the patient and he was encouraged to bring the bottles to every visit to confirm accuracy of list.  Return in 3 months or sooner for any questions/problems before then.

## 2016-06-13 NOTE — Therapy (Signed)
[facility] [address]. [place], [place], [address] Phone: [phone]   Fax:  [phone]  Physical Therapy Treatment  Patient Details  Name: Andres Gutierrez MRN: [id number] Date of Birth: 11-18-1930 Referring Provider: Emily Filbert MD  Encounter Date: 06/13/2016      PT End of Session - 06/13/16 1632    Visit Number 17   Number of Visits 22   Date for PT Re-Evaluation 06/25/16   Authorization - Visit Number 3   Authorization - Number of Visits 10   PT Start Time 5465   PT Stop Time 0354   PT Time Calculation (min) 61 min   Equipment Utilized During Treatment Gait belt   Activity Tolerance Patient tolerated treatment well;No increased pain   Behavior During Therapy WFL for tasks assessed/performed      Past Medical History:  Diagnosis Date  . Anemia   . Anxiety   . Atrial fibrillation ([place])   . Cardiomyopathy ([place])   . CHF (congestive heart failure) ([place])   . Chronic kidney disease    kidney stones  . Coronary artery disease   . Cough   . Dysrhythmia   . Hypertension   . Lymphadenopathy, hilar   . MI (myocardial infarction)    x 2  . Wheezing     Past Surgical History:  Procedure Laterality Date  . BRONCHIAL NEEDLE ASPIRATION BIOPSY N/A 12/31/2014   Procedure: BRONCHIAL NEEDLE ASPIRATION BIOPSIES from carina;  Surgeon: Andres Lipps, MD;  Location: [facility] ORS;  Service: Cardiopulmonary;  Laterality: N/A;  . CARDIAC CATHETERIZATION N/A 12/28/2015   Procedure: Left Heart Cath and Cors/Grafts Angiography;  Surgeon: Andres Kida, MD;  Location: [place] CV LAB;  Service: Cardiovascular;  Laterality: N/A;  . CORONARY ANGIOPLASTY WITH STENT PLACEMENT    . CORONARY ARTERY BYPASS GRAFT    . ENDOBRONCHIAL ULTRASOUND N/A 12/31/2014   Procedure: ENDOBRONCHIAL ULTRASOUND;  Surgeon: Andres Lipps, MD;  Location: [facility] ORS;  Service: Cardiopulmonary;  Laterality: N/A;  . ESOPHAGOGASTRODUODENOSCOPY (EGD) WITH PROPOFOL N/A  06/20/2015   Procedure: ESOPHAGOGASTRODUODENOSCOPY (EGD) WITH PROPOFOL;  Surgeon: Andres Sails, MD;  Location: [facility] ENDOSCOPY;  Service: Endoscopy;  Laterality: N/A;    There were no vitals filed for this visit.      Subjective Assessment - 06/13/16 1624    Subjective Pt. reports no new complaints, no pain today. Pt. reports compliance with HEP , no falls this past week.    Patient is accompained by: Family member   Pertinent History R foot drop beginning in June of 2017 due to spinal stenosis. Hx of chronic foot ulceration, R foot/leg cellulitis due to AFO use.   Limitations Walking;Standing;House hold activities   How long can you stand comfortably? current standing tolerance without support 7 minutes   Patient Stated Goals pt would like to improve his strength, endurance and balance so he can return to normal activity level      Therex:  Scifit L8 15 min B UE/LE (consistent cadence, warm-up/no charge) Walking marches //-bars single finger assist - no LOB  Sit to stands 10x B UE assist (good form w/ no fatigue) Sit to stands 10x no UE assist - some fatigue noted and CGA for balance. Pt. Cued to scoot to edge of chair and use LE's to stand Side stepping //-bars 4x - verbal cues to maintain R foot pointed forward  Step-over's (6") cuing to keep R foot forward  Step-ups/downs 6" / step up and overs- Step  1 finger assist  Standing marches, hip abduction, hip ext. w/ 4# ankle weights (B UE support)   Neuro: Tandem walking //-bars w/ verbal cuing for posture and head alignment  Bosu step-ups x10 - good ankle control w/ single UE assist  airex double leg balance (no assist) pt able to balance for >30 sec  airex tandem stance balance (1 UE assist) w/ some LOB w/ no assist.  Balance w/ feet together - pt able to achieve>30 sec safely      Pt. Response to medical necessity: Pt. Will benefit from continued skilled PT to improve safe gait mechanics and increase dynamic balance to  promote independence with ADLs and increase functional mobility      PT Long Term Goals - 05/30/16 1840      PT LONG TERM GOAL #1   Title Pt will score >45/56 on BERG balance assessment to increase functional mobility and decrease fall risk   Baseline 1/15: 47/56   Time 4   Period Weeks   Status Achieved     PT LONG TERM GOAL #2   Title Pt will complete LEFS outcome measure and score >40 out of 80 to improve functional mobility/ ADLs.   Baseline 1/15: LEFS 33 out of 80   Time 4   Period Weeks   Status Partially Met     PT LONG TERM GOAL #3   Title Pt will increase LE strength 1/2 per MMT in hip flexion, knee extension, knee flexion to promote return to prior strength so that he can perform household duties independently   Baseline B LE muscle weakness: B hip flexion (3+/5 MMT), B hip ext./IR (4-/5 MMT), R knee ext. (4-/5 MMT), L knee ext. (4+/5 MMT), R DF (1/5 MMT), L DF (4+/5 MMT), B knee flexion (4+/5 MMT).    Time 4   Period Weeks   Status Partially Met     PT LONG TERM GOAL #4   Title Pt will perform sit<>stand with unilateral UE assist or hands on knees in clinic chair to progress toward previous level of function   Baseline benefits from B UE assist at this time (difficulty with low chair).   Time 4   Period Weeks   Status Not Met     PT LONG TERM GOAL #5   Title Pt will score >50/56 on BERG balance assessment to increase functional mobility and decrease fall risk   Baseline 1/15: Andres Gutierrez 47/56   Time 4   Period Weeks   Status New               Plan - 06/13/16 1709    Clinical Impression Statement Pt. continues to work hard progressing LE strength and balance exercises. Pt. continues to demonstrate compensations for weak LE musculature as noted in crouch gait and posterior trunk lean during standing and ambulating activities. Pt. demonstrates increased tandem stance balance time (>30 sec.) within parallel bars and no upper extremity assist. Pt. requires mod.  Verbal cuing to maintain upright posture during standing hip exercises and would compensate for hip weakness with forward trunk lean. Pt. would benefit from continued skilled therapy to address global weakness, balance deficits, and capacity for functional activities.    Rehab Potential Good   Clinical Impairments Affecting Rehab Potential Positive: family support, PLOF. Negative: dx, permanent nerve damage   PT Frequency 1x / week   PT Duration 4 weeks   PT Treatment/Interventions ADLs/Self Care Home Management;Biofeedback;Cryotherapy;Electrical Stimulation;Gait training;Stair training;Functional mobility training;Therapeutic activities;Therapeutic exercise;Balance training;Neuromuscular re-education;Patient/family  education;Orthotic Fit/Training;Manual techniques   PT Next Visit Plan Progressive strengthening and balance program.   Gait training to QC/ least assistive device.     PT Home Exercise Plan see pt instructions for detailed HEP. Continue as prescribed   Consulted and Agree with Plan of Care Patient;Family member/caregiver      Patient will benefit from skilled therapeutic intervention in order to improve the following deficits and impairments:  Abnormal gait, Decreased activity tolerance, Decreased balance, Decreased coordination, Decreased endurance, Decreased knowledge of use of DME, Decreased mobility, Decreased skin integrity, Decreased strength, Difficulty walking, Impaired perceived functional ability, Postural dysfunction  Visit Diagnosis: Difficulty in walking, not elsewhere classified  History of falling  Foot drop, right     Problem List Patient Active Problem List   Diagnosis Date Noted  . Chronic systolic heart failure ([place]) 01/17/2016  . Chewing tobacco use 01/17/2016  . Atrial fibrillation with RVR ([place]) 12/23/2015  . Dyspnea on exertion 12/23/2015  . Elevated troponin 12/23/2015  . GERD (gastroesophageal reflux disease) 12/23/2015  . Other iron deficiency  anemias 12/20/2015  . Femoral neck fracture, left, closed, initial encounter 11/03/2015  . Chronic atrial fibrillation ([place])   . Coronary artery disease involving native coronary artery of native heart without angina pectoris   . Acalculous cholecystitis 10/29/2015  . Adenopathy   . CAD (coronary artery disease) 12/22/2014  . HTN (hypertension) 12/22/2014   Pura Spice, PT, DPT # [id number] Willodean Rosenthal, SPT 06/13/2016, 6:47 PM   [facility] [address] [place], [place], [address] Phone: [phone]   Fax:  [phone]  Name: Andres Gutierrez MRN: [id number] Date of Birth: Sep 09, 1930

## 2016-06-19 ENCOUNTER — Inpatient Hospital Stay: Payer: PPO | Attending: Internal Medicine

## 2016-06-19 DIAGNOSIS — Z87442 Personal history of urinary calculi: Secondary | ICD-10-CM | POA: Diagnosis not present

## 2016-06-19 DIAGNOSIS — I2510 Atherosclerotic heart disease of native coronary artery without angina pectoris: Secondary | ICD-10-CM | POA: Insufficient documentation

## 2016-06-19 DIAGNOSIS — D696 Thrombocytopenia, unspecified: Secondary | ICD-10-CM | POA: Insufficient documentation

## 2016-06-19 DIAGNOSIS — I429 Cardiomyopathy, unspecified: Secondary | ICD-10-CM | POA: Insufficient documentation

## 2016-06-19 DIAGNOSIS — Z79899 Other long term (current) drug therapy: Secondary | ICD-10-CM | POA: Insufficient documentation

## 2016-06-19 DIAGNOSIS — Z87891 Personal history of nicotine dependence: Secondary | ICD-10-CM | POA: Diagnosis not present

## 2016-06-19 DIAGNOSIS — N189 Chronic kidney disease, unspecified: Secondary | ICD-10-CM | POA: Diagnosis not present

## 2016-06-19 DIAGNOSIS — F419 Anxiety disorder, unspecified: Secondary | ICD-10-CM | POA: Insufficient documentation

## 2016-06-19 DIAGNOSIS — I129 Hypertensive chronic kidney disease with stage 1 through stage 4 chronic kidney disease, or unspecified chronic kidney disease: Secondary | ICD-10-CM | POA: Diagnosis not present

## 2016-06-19 DIAGNOSIS — Z800 Family history of malignant neoplasm of digestive organs: Secondary | ICD-10-CM | POA: Diagnosis not present

## 2016-06-19 DIAGNOSIS — I4891 Unspecified atrial fibrillation: Secondary | ICD-10-CM | POA: Diagnosis not present

## 2016-06-19 DIAGNOSIS — I509 Heart failure, unspecified: Secondary | ICD-10-CM | POA: Diagnosis not present

## 2016-06-19 DIAGNOSIS — D500 Iron deficiency anemia secondary to blood loss (chronic): Secondary | ICD-10-CM | POA: Insufficient documentation

## 2016-06-19 DIAGNOSIS — I252 Old myocardial infarction: Secondary | ICD-10-CM | POA: Diagnosis not present

## 2016-06-19 DIAGNOSIS — R591 Generalized enlarged lymph nodes: Secondary | ICD-10-CM | POA: Insufficient documentation

## 2016-06-19 DIAGNOSIS — Z7982 Long term (current) use of aspirin: Secondary | ICD-10-CM | POA: Diagnosis not present

## 2016-06-19 DIAGNOSIS — R05 Cough: Secondary | ICD-10-CM | POA: Diagnosis not present

## 2016-06-19 DIAGNOSIS — D508 Other iron deficiency anemias: Secondary | ICD-10-CM

## 2016-06-19 LAB — CBC WITH DIFFERENTIAL/PLATELET
BASOS ABS: 0 10*3/uL (ref 0–0.1)
BASOS PCT: 1 %
Eosinophils Absolute: 0 10*3/uL (ref 0–0.7)
Eosinophils Relative: 1 %
HCT: 33.3 % — ABNORMAL LOW (ref 40.0–52.0)
HEMOGLOBIN: 11.2 g/dL — AB (ref 13.0–18.0)
Lymphocytes Relative: 31 %
Lymphs Abs: 1.9 10*3/uL (ref 1.0–3.6)
MCH: 33.8 pg (ref 26.0–34.0)
MCHC: 33.6 g/dL (ref 32.0–36.0)
MCV: 100.5 fL — ABNORMAL HIGH (ref 80.0–100.0)
MONO ABS: 1.2 10*3/uL — AB (ref 0.2–1.0)
Monocytes Relative: 19 %
NEUTROS ABS: 3 10*3/uL (ref 1.4–6.5)
NEUTROS PCT: 48 %
Platelets: 163 10*3/uL (ref 150–440)
RBC: 3.31 MIL/uL — AB (ref 4.40–5.90)
RDW: 15.9 % — AB (ref 11.5–14.5)
WBC: 6.2 10*3/uL (ref 3.8–10.6)

## 2016-06-19 LAB — COMPREHENSIVE METABOLIC PANEL
ALBUMIN: 4.6 g/dL (ref 3.5–5.0)
ALT: 14 U/L — AB (ref 17–63)
AST: 20 U/L (ref 15–41)
Alkaline Phosphatase: 59 U/L (ref 38–126)
Anion gap: 8 (ref 5–15)
BILIRUBIN TOTAL: 1.1 mg/dL (ref 0.3–1.2)
BUN: 23 mg/dL — AB (ref 6–20)
CO2: 25 mmol/L (ref 22–32)
CREATININE: 1.24 mg/dL (ref 0.61–1.24)
Calcium: 9.3 mg/dL (ref 8.9–10.3)
Chloride: 102 mmol/L (ref 101–111)
GFR calc Af Amer: >60 mL/min (ref >60)
GFR calc non Af Amer: 52 mL/min — ABNORMAL LOW (ref >60)
Glucose, Bld: 99 mg/dL (ref 65–99)
POTASSIUM: 4.7 mmol/L (ref 3.5–5.1)
Sodium: 135 mmol/L (ref 135–145)
TOTAL PROTEIN: 7.9 g/dL (ref 6.5–8.1)

## 2016-06-19 LAB — IRON AND TIBC
Iron: 57 ug/dL (ref 45–182)
SATURATION RATIOS: 16 % — AB (ref 17.9–39.5)
TIBC: 352 ug/dL (ref 250–450)
UIBC: 295 ug/dL

## 2016-06-19 LAB — FERRITIN: Ferritin: 48 ng/mL (ref 24–336)

## 2016-06-20 ENCOUNTER — Encounter: Payer: Self-pay | Admitting: Physical Therapy

## 2016-06-20 ENCOUNTER — Telehealth: Payer: Self-pay | Admitting: *Deleted

## 2016-06-20 ENCOUNTER — Ambulatory Visit: Payer: PPO | Attending: Internal Medicine | Admitting: Physical Therapy

## 2016-06-20 ENCOUNTER — Other Ambulatory Visit: Payer: Self-pay | Admitting: Internal Medicine

## 2016-06-20 DIAGNOSIS — Z9181 History of falling: Secondary | ICD-10-CM | POA: Insufficient documentation

## 2016-06-20 DIAGNOSIS — R262 Difficulty in walking, not elsewhere classified: Secondary | ICD-10-CM | POA: Insufficient documentation

## 2016-06-20 DIAGNOSIS — M21371 Foot drop, right foot: Secondary | ICD-10-CM | POA: Diagnosis not present

## 2016-06-20 NOTE — Therapy (Signed)
[facility] [address] [place], [place], [address] Phone: [phone]   Fax:  [phone]  Physical Therapy Treatment  Patient Details  Name: Andres Gutierrez MRN: [id number] Date of Birth: Oct 15, 1930 Referring Provider: Emily Filbert MD  Encounter Date: 06/20/2016      PT End of Session - 06/20/16 1710    Visit Number 18   Number of Visits 22   Date for PT Re-Evaluation 06/25/16   Authorization - Visit Number 4   Authorization - Number of Visits 10   PT Start Time 0086   PT Stop Time 7619   PT Time Calculation (min) 57 min   Equipment Utilized During Treatment Gait belt   Activity Tolerance Patient tolerated treatment well   Behavior During Therapy [facility] for tasks assessed/performed      Past Medical History:  Diagnosis Date  . Anemia   . Anxiety   . Atrial fibrillation ([place])   . Cardiomyopathy ([place])   . CHF (congestive heart failure) ([place])   . Chronic kidney disease    kidney stones  . Coronary artery disease   . Cough   . Dysrhythmia   . Hypertension   . Lymphadenopathy, hilar   . MI (myocardial infarction)    x 2  . Wheezing     Past Surgical History:  Procedure Laterality Date  . BRONCHIAL NEEDLE ASPIRATION BIOPSY N/A 12/31/2014   Procedure: BRONCHIAL NEEDLE ASPIRATION BIOPSIES from carina;  Surgeon: Andres Lipps, MD;  Location: [facility] ORS;  Service: Cardiopulmonary;  Laterality: N/A;  . CARDIAC CATHETERIZATION N/A 12/28/2015   Procedure: Left Heart Cath and Cors/Grafts Angiography;  Surgeon: Andres Kida, MD;  Location: [facility] [facility];  Service: Cardiovascular;  Laterality: N/A;  . CORONARY ANGIOPLASTY WITH STENT PLACEMENT    . CORONARY ARTERY BYPASS GRAFT    . ENDOBRONCHIAL ULTRASOUND N/A 12/31/2014   Procedure: ENDOBRONCHIAL ULTRASOUND;  Surgeon: Andres Lipps, MD;  Location: [facility] ORS;  Service: Cardiopulmonary;  Laterality: N/A;  . ESOPHAGOGASTRODUODENOSCOPY (EGD) WITH PROPOFOL N/A 06/20/2015   Procedure:  ESOPHAGOGASTRODUODENOSCOPY (EGD) WITH PROPOFOL;  Surgeon: Andres Sails, MD;  Location: [facility] ENDOSCOPY;  Service: Endoscopy;  Laterality: N/A;    There were no vitals filed for this visit.      Subjective Assessment - 06/20/16 1555    Subjective Pt. reports no pain today/no new complaints. No falls this past week.    Patient is accompained by: Family member   Pertinent History R foot drop beginning in June of 2017 due to spinal stenosis. Hx of chronic foot ulceration, R foot/leg cellulitis due to AFO use.   Limitations Walking;Standing;House hold activities   How long can you stand comfortably? current standing tolerance without support 7 minutes   Patient Stated Goals pt would like to improve his strength, endurance and balance so he can return to normal activity level   Currently in Pain? No/denies     Therex: Forward walking in //-bars with cuing to exaggerate B heelstrike. Pt. Relies on no UE support and becoming more steady on his feet requiring occasional support Sideways walking in //-bars Step-over's on 6" step in //-bars single UE assist. Some LOB noted with verbal cuing to  Step-up/downs 6" step alternating feet. Some unsteadiness on feet today. LLE weakness during step-up phase.  Standing marches, hip extension, and hip abduction w/ 4# ankle weights single UE assist. Forward posture after LE fatigue. 10x each R/L  STS 2x10, 1 set [facility], 1 set [facility]  Neuro: Airex balancing eyes open and eyes closed. Some episodes of LOB noted with eyes closed but overall good ankle and knee stability Tandem airex balance  Cone taps 10x each leg, knocked cone down twice. Required single two finger UE assistance on // bar   Gait Tng: Ambulating in // bars with no UE assist and cues for heel strike and upright posture. Performed after interventions. Walking forwards and backwards in // bars 3x.  Cuing for step length/ pattern.       Pt. Response to medical necessity: Pt. Will benefit  from continued skilled PT to improve safe gait mechanics and increase dynamic balance to promote independence with ADLs and increase functional mobility.       PT Long Term Goals - 05/30/16 1840      PT LONG TERM GOAL #1   Title Pt will score >45/56 on BERG balance assessment to increase functional mobility and decrease fall risk   Baseline 1/15: 47/56   Time 4   Period Weeks   Status Achieved     PT LONG TERM GOAL #2   Title Pt will complete LEFS outcome measure and score >40 out of 80 to improve functional mobility/ ADLs.   Baseline 1/15: LEFS 33 out of 80   Time 4   Period Weeks   Status Partially Met     PT LONG TERM GOAL #3   Title Pt will increase LE strength 1/2 per MMT in hip flexion, knee extension, knee flexion to promote return to prior strength so that he can perform household duties independently   Baseline B LE muscle weakness: B hip flexion (3+/5 MMT), B hip ext./IR (4-/5 MMT), R knee ext. (4-/5 MMT), L knee ext. (4+/5 MMT), R DF (1/5 MMT), L DF (4+/5 MMT), B knee flexion (4+/5 MMT).    Time 4   Period Weeks   Status Partially Met     PT LONG TERM GOAL #4   Title Pt will perform sit<>stand with unilateral UE assist or hands on knees in clinic chair to progress toward previous level of function   Baseline benefits from B UE assist at this time (difficulty with low chair).   Time 4   Period Weeks   Status Not Met     PT LONG TERM GOAL #5   Title Pt will score >50/56 on BERG balance assessment to increase functional mobility and decrease fall risk   Baseline 1/15: Andres Gutierrez 47/56   Time 4   Period Weeks   Status New             Plan - 06/20/16 1711    Clinical Impression Statement Patient continues to progress with capacity for functional activity, performing more tasks with less rest breaks. Patient continues to compensate for weak LE musculature with crouched gait but improved with verbal cues for heel strike. Patient's carryover from interventions was  assessed with walking in parallel bars with noted increased heel strike and step length. Balance continues to challenge the patient with tandem stance and eyes closed. Patient's HEP was updated to include standing marches, hip abduction, side walks, and heel strike during ambulation. Patient would benefit from continued skilled physical therapy to progress with gross strengthening, balance, and increased functional capacity for activities of daily living.     Rehab Potential Good   Clinical Impairments Affecting Rehab Potential Positive: family support, PLOF. Negative: dx, permanent nerve damage   PT Frequency 1x / week   PT Duration 4 weeks   PT  Treatment/Interventions ADLs/Self Care Home Management;Biofeedback;Cryotherapy;Electrical Stimulation;Gait training;Stair training;Functional mobility training;Therapeutic activities;Therapeutic exercise;Balance training;Neuromuscular re-education;Patient/family education;Orthotic Fit/Training;Manual techniques   PT Next Visit Plan Progressive strengthening and balance program.   Gait training to QC/ least assistive device.  CHECK GOALS/SCHEDULE NEXT TX SESSION   PT Home Exercise Plan see pt instructions for detailed HEP. Continue as prescribed   Consulted and Agree with Plan of Care Patient;Family member/caregiver      Patient will benefit from skilled therapeutic intervention in order to improve the following deficits and impairments:  Abnormal gait, Decreased activity tolerance, Decreased balance, Decreased coordination, Decreased endurance, Decreased knowledge of use of DME, Decreased mobility, Decreased skin integrity, Decreased strength, Difficulty walking, Impaired perceived functional ability, Postural dysfunction  Visit Diagnosis: Difficulty in walking, not elsewhere classified  History of falling  Foot drop, right     Problem List Patient Active Problem List   Diagnosis Date Noted  . Chronic systolic heart failure ([place]) 01/17/2016  .  Chewing tobacco use 01/17/2016  . Atrial fibrillation with RVR ([place]) 12/23/2015  . Dyspnea on exertion 12/23/2015  . Elevated troponin 12/23/2015  . GERD (gastroesophageal reflux disease) 12/23/2015  . Iron deficiency anemia due to chronic blood loss 12/20/2015  . Femoral neck fracture, left, closed, initial encounter 11/03/2015  . Chronic atrial fibrillation ([place])   . Coronary artery disease involving native coronary artery of native heart without angina pectoris   . Acalculous cholecystitis 10/29/2015  . Adenopathy   . CAD (coronary artery disease) 12/22/2014  . HTN (hypertension) 12/22/2014   Pura Spice, PT, DPT # [id number] Janna Arch, SPT 06/21/2016, 9:35 AM  [place] [facility] [address] [place], [place], [address] Phone: [phone]   Fax:  [phone]  Name: Andres Gutierrez MRN: [id number] Date of Birth: 13-Sep-1930

## 2016-06-20 NOTE — Progress Notes (Signed)
msg left for patient regarding the need for IV iron at next apt.

## 2016-06-20 NOTE — Telephone Encounter (Signed)
Pt made aware that IV iron is needed. msg sent to sch. To arrange on iron on Tuesday's sch in [place].

## 2016-06-20 NOTE — Telephone Encounter (Signed)
-----   Message from Cammie Sickle, MD sent at 06/20/2016  5:07 PM EST ----- Please schedule for IV venofer at next visit- Thx

## 2016-06-21 NOTE — Progress Notes (Signed)
Spoke with patient. He is agreeable to iv iron

## 2016-06-22 DIAGNOSIS — H2512 Age-related nuclear cataract, left eye: Secondary | ICD-10-CM | POA: Diagnosis not present

## 2016-06-26 ENCOUNTER — Inpatient Hospital Stay: Payer: PPO

## 2016-06-26 ENCOUNTER — Inpatient Hospital Stay (HOSPITAL_BASED_OUTPATIENT_CLINIC_OR_DEPARTMENT_OTHER): Payer: PPO | Admitting: Internal Medicine

## 2016-06-26 VITALS — BP 152/73 | HR 69 | Temp 97.6°F | Wt 204.0 lb

## 2016-06-26 DIAGNOSIS — Z79899 Other long term (current) drug therapy: Secondary | ICD-10-CM

## 2016-06-26 DIAGNOSIS — R591 Generalized enlarged lymph nodes: Secondary | ICD-10-CM

## 2016-06-26 DIAGNOSIS — I252 Old myocardial infarction: Secondary | ICD-10-CM

## 2016-06-26 DIAGNOSIS — I509 Heart failure, unspecified: Secondary | ICD-10-CM

## 2016-06-26 DIAGNOSIS — I4891 Unspecified atrial fibrillation: Secondary | ICD-10-CM

## 2016-06-26 DIAGNOSIS — I2510 Atherosclerotic heart disease of native coronary artery without angina pectoris: Secondary | ICD-10-CM

## 2016-06-26 DIAGNOSIS — D500 Iron deficiency anemia secondary to blood loss (chronic): Secondary | ICD-10-CM

## 2016-06-26 DIAGNOSIS — I129 Hypertensive chronic kidney disease with stage 1 through stage 4 chronic kidney disease, or unspecified chronic kidney disease: Secondary | ICD-10-CM | POA: Diagnosis not present

## 2016-06-26 DIAGNOSIS — N189 Chronic kidney disease, unspecified: Secondary | ICD-10-CM

## 2016-06-26 DIAGNOSIS — D696 Thrombocytopenia, unspecified: Secondary | ICD-10-CM

## 2016-06-26 DIAGNOSIS — Z87891 Personal history of nicotine dependence: Secondary | ICD-10-CM

## 2016-06-26 DIAGNOSIS — I429 Cardiomyopathy, unspecified: Secondary | ICD-10-CM

## 2016-06-26 DIAGNOSIS — Z800 Family history of malignant neoplasm of digestive organs: Secondary | ICD-10-CM

## 2016-06-26 DIAGNOSIS — F419 Anxiety disorder, unspecified: Secondary | ICD-10-CM

## 2016-06-26 DIAGNOSIS — R05 Cough: Secondary | ICD-10-CM

## 2016-06-26 DIAGNOSIS — Z87442 Personal history of urinary calculi: Secondary | ICD-10-CM

## 2016-06-26 DIAGNOSIS — Z7982 Long term (current) use of aspirin: Secondary | ICD-10-CM

## 2016-06-26 NOTE — Assessment & Plan Note (Addendum)
#  Anemia mild hemoglobin 11-12 [at least since 2012]. Likely secondary to CKD/iron deficiency [creatinine-1.3/ Ferritin 48 saturation 16%.] vs ? MDS [no Bmbx]. Today hemoglobin 11; MCV 100. Discussed regarding IV iron. Patient taking by mouth iron every other day ; recommend by mouth iron once a day.  Also discussed regarding need for a possible bone marrow biopsy if patient's hemoglobin continues to get worse in the context of improving Iron stores.   # Intermittent thrombocytopenia- again unclear etiology. Asymptomatic. CT scan 2017-  did not show any evidence of cirrhosis or splenomegaly. Asymptomatic/C discussion above. Today - platelets- 163.   # Patient will follow-up with me in approximately 6 months CBC iron studies and ferritin; possible IV venofer.    #The above plan of care was discussed with the patient and his daughter in detail.

## 2016-06-26 NOTE — Progress Notes (Signed)
Patient here today for follow up. Patient states no new concerns  

## 2016-06-26 NOTE — Progress Notes (Signed)
[place] NOTE  Patient Care Team: Rusty Aus, MD as PCP - General (Internal Medicine) Lyman Speller, RN as [place], [place] as Nurse Practitioner (Family Medicine) Teodoro Spray, MD as Consulting Physician (Cardiology) Erby Pian, MD as Referring Physician (Specialist)  CHIEF COMPLAINTS/PURPOSE OF CONSULTATION:   # 2012- Anemia 11-12- sec to CKD/IDA? MDS [no BMBx] [March 2017-sat-11%; ferritin-42] [Feb 2017- EGD/colo-July 2015 Dr.Skulskie];April 2017-PO Iron PO.   # Intermittent thrombocytopenia 120s-140s [Dec 2016-CT- neg cirrhosis/splenomegaly]  # CKD [creatinine 1.2- 1.4]; CAD/CHF  HISTORY OF PRESENTING ILLNESS:  Andres Gutierrez 83 y.o.  male history of above history of chronic anemia and intermittent thrombocytopenia is here to review the results of the blood work.  Appetite is good. No recent admission the hospital. No unusual shortness of breath. No blood in stools black red stools. He is taking iron pills every other day. Denies any constipation or abdominal discomfort or nausea.Marland Kitchen He continues to complain of mild fatigue; otherwise denies any blood in stools or easy bruising.   ROS: A complete 10 point review of system is done which is negative except mentioned above in history of present illness  MEDICAL HISTORY:  Past Medical History:  Diagnosis Date  . Anemia   . Anxiety   . Atrial fibrillation ([place])   . Cardiomyopathy ([place])   . CHF (congestive heart failure) ([place])   . Chronic kidney disease    kidney stones  . Coronary artery disease   . Cough   . Dysrhythmia   . Hypertension   . Lymphadenopathy, hilar   . MI (myocardial infarction)    x 2  . Wheezing     SURGICAL HISTORY: Past Surgical History:  Procedure Laterality Date  . BRONCHIAL NEEDLE ASPIRATION BIOPSY N/A 12/31/2014   Procedure: BRONCHIAL NEEDLE ASPIRATION BIOPSIES from carina;  Surgeon: Flora Lipps, MD;  Location:  [facility] ORS;  Service: Cardiopulmonary;  Laterality: N/A;  . CARDIAC CATHETERIZATION N/A 12/28/2015   Procedure: Left Heart Cath and Cors/Grafts Angiography;  Surgeon: Yolonda Kida, MD;  Location: [place] [facility];  Service: Cardiovascular;  Laterality: N/A;  . CORONARY ANGIOPLASTY WITH STENT PLACEMENT    . CORONARY ARTERY BYPASS GRAFT    . ENDOBRONCHIAL ULTRASOUND N/A 12/31/2014   Procedure: ENDOBRONCHIAL ULTRASOUND;  Surgeon: Flora Lipps, MD;  Location: [facility] ORS;  Service: Cardiopulmonary;  Laterality: N/A;  . ESOPHAGOGASTRODUODENOSCOPY (EGD) WITH PROPOFOL N/A 06/20/2015   Procedure: ESOPHAGOGASTRODUODENOSCOPY (EGD) WITH PROPOFOL;  Surgeon: Lollie Sails, MD;  Location: [facility] ENDOSCOPY;  Service: Endoscopy;  Laterality: N/A;    SOCIAL HISTORY: lives in [place]; alone. 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  Social History   Social History  . Marital status: Widowed    Spouse name: N/A  . Number of children: N/A  . Years of education: N/A   Occupational History  . Not on file.   Social History Main Topics  . Smoking status: Former Smoker    Quit date: 06/27/1977  . Smokeless tobacco: Current User    Types: Chew  . Alcohol use Yes     Comment: occational almost rare once a year  . Drug use: No  . Sexual activity: Not on file   Other Topics Concern  . Not on file   Social History Narrative  . No narrative on file    FAMILY HISTORY: Family History  Problem Relation Age of Onset  . CAD Mother   . Colon cancer Father  ALLERGIES:  is allergic to 5ht3 receptor antagonists; diphenhydramine hcl; escitalopram; maxidex [dexamethasone]; prednisone; serotonin; vytorin [ezetimibe-simvastatin]; and zocor [simvastatin].  MEDICATIONS:  Current Outpatient Prescriptions  Medication Sig Dispense Refill  . aspirin EC 81 MG tablet Take 81 mg by mouth daily.    Marland Kitchen atorvastatin (LIPITOR) 40 MG tablet Take 1 tablet (40 mg total) by mouth daily at 6 PM. 30 tablet 0  . digoxin (LANOXIN) 0.125 MG  tablet Take 0.0625 mg by mouth daily.    . ferrous sulfate 325 (65 FE) MG tablet Take 1 tablet (325 mg total) by mouth daily. (Patient taking differently: Take 325 mg by mouth every other day. ) 30 tablet 3  . furosemide (LASIX) 20 MG tablet Take 20 mg by mouth as needed. For weight gain greater than 2 pounds in 24 hours or greater than 5 pounds in one week    . linaclotide (LINZESS) 72 MCG capsule Take 1 capsule (72 mcg total) by mouth daily before breakfast. 30 capsule 0  . metoprolol tartrate (LOPRESSOR) 12.5 mg TABS tablet Take 12.5 mg by mouth 2 (two) times daily.    . nitroGLYCERIN (NITROSTAT) 0.4 MG SL tablet Place 0.4 mg under the tongue every 5 (five) minutes as needed for chest pain.    . pantoprazole (PROTONIX) 40 MG tablet Take 40 mg by mouth 2 (two) times daily before a meal.     . potassium chloride (K-DUR) 10 MEQ tablet Take 10 mEq by mouth 2 (two) times daily.    . ranolazine (RANEXA) 500 MG 12 hr tablet Take 1 tablet (500 mg total) by mouth 2 (two) times daily. 60 tablet 0  . sucralfate (CARAFATE) 1 G tablet Take 1 g by mouth 2 (two) times daily.     . tamsulosin (FLOMAX) 0.4 MG CAPS capsule Take 0.4 mg by mouth daily after supper.     . vitamin C (VITAMIN C) 250 MG tablet Take 1 tablet (250 mg total) by mouth daily. 30 tablet 0  . senna (SENOKOT) 8.6 MG tablet Take 1 tablet by mouth at bedtime as needed for constipation.     No current facility-administered medications for this visit.       Marland Kitchen  PHYSICAL EXAMINATION: ECOG PERFORMANCE STATUS: 0 - Asymptomatic  Vitals:   06/26/16 0838  BP: (!) 152/73  Pulse: 69  Temp: 97.6 F (36.4 C)   Filed Weights   06/26/16 0838  Weight: 204 lb (92.5 kg)    GENERAL: Well-nourished well-developed; Alert, no distress and comfortable.   Accompanied by family.He is walking with a rolling walker. HEENT-withi is an outpatientn normal limits. Oral mucosa moist no oral ulcerations. No thrush. Chest decreased breath sounds at bases. No  visible crackles. Heart regular and no murmurs. 1+ bilateral lower extremity swelling  Abdomen soft nontender nondistended. No hepatomegaly. Bowel sounds present. Neurologic no deficits.   LABORATORY DATA:  I have reviewed the data as listed Lab Results  Component Value Date   WBC 6.2 06/19/2016   HGB 11.2 (L) 06/19/2016   HCT 33.3 (L) 06/19/2016   MCV 100.5 (H) 06/19/2016   PLT 163 06/19/2016    Recent Labs  12/26/15 0951  01/02/16 0538  01/25/16 0546 01/26/16 0640 06/19/16 0822  NA 136  < > 130*  < > 135 134* 135  K 3.7  < > 4.4  < > 5.2* 4.0 4.7  CL 105  < > 97*  < > 100* 100* 102  CO2 24  < > 24  < >  28 26 25   GLUCOSE 111*  < > 123*  < > 95 93 99  BUN 27*  < > 29*  < > 13 11 23*  CREATININE 1.18  < > 1.11  < > 1.22 1.09 1.24  CALCIUM 9.1  < > 8.3*  < > 9.0 8.8* 9.3  GFRNONAA 56*  < > 60*  < > 53* >60 52*  GFRAA >60  < > >60  < > >60 >60 >60  PROT 6.9  --  7.3  --   --   --  7.9  ALBUMIN 3.6  --  3.3*  --   --   --  4.6  AST 30  --  18  --   --   --  20  ALT 19  --  13*  --   --   --  14*  ALKPHOS 75  --  85  --   --   --  59  BILITOT 1.1  --  1.8*  --   --   --  1.1  < > = values in this interval not displayed. ASSESSMENT & PLAN:   Iron deficiency anemia due to chronic blood loss # Anemia mild hemoglobin 11-12 [at least since 2012]. Likely secondary to CKD/iron deficiency [creatinine-1.3/ Ferritin 48 saturation 16%.] vs ? MDS [no Bmbx]. Today hemoglobin 11; MCV 100. Discussed regarding IV iron. Patient taking by mouth iron every other day ; recommend by mouth iron once a day.  Also discussed regarding need for a possible bone marrow biopsy if patient's hemoglobin continues to get worse in the context of improving Iron stores.   # Intermittent thrombocytopenia- again unclear etiology. Asymptomatic. CT scan 2017-  did not show any evidence of cirrhosis or splenomegaly. Asymptomatic/C discussion above. Today - platelets- 163.   # Patient will follow-up with me in  approximately 6 months CBC iron studies and ferritin; possible IV venofer.    #The above plan of care was discussed with the patient and his daughter in detail.      Cammie Sickle, MD 06/26/2016 9:03 AM

## 2016-06-27 ENCOUNTER — Ambulatory Visit: Payer: PPO | Admitting: Physical Therapy

## 2016-06-27 DIAGNOSIS — R262 Difficulty in walking, not elsewhere classified: Secondary | ICD-10-CM | POA: Diagnosis not present

## 2016-06-27 DIAGNOSIS — Z9181 History of falling: Secondary | ICD-10-CM

## 2016-06-27 DIAGNOSIS — M21371 Foot drop, right foot: Secondary | ICD-10-CM

## 2016-06-27 NOTE — Therapy (Signed)
[facility] [address]. [place], [place], [address] Phone: [phone]   Fax:  [phone]  Physical Therapy Treatment  Patient Details  Name: Andres Gutierrez MRN: [id number] Date of Birth: March 01, 1931 Referring Provider: Emily Filbert MD  Encounter Date: 06/27/2016      PT End of Session - 06/27/16 1619    Visit Number 19   Number of Visits 23   Date for PT Re-Evaluation 07/25/16   Authorization - Visit Number 5   Authorization - Number of Visits 14   PT Start Time 1561   PT Stop Time 1652   PT Time Calculation (min) 58 min   Equipment Utilized During Treatment Gait belt   Activity Tolerance Patient tolerated treatment well   Behavior During Therapy [facility] for tasks assessed/performed      Past Medical History:  Diagnosis Date  . Anemia   . Anxiety   . Atrial fibrillation ([place])   . Cardiomyopathy ([place])   . CHF (congestive heart failure) ([place])   . Chronic kidney disease    kidney stones  . Coronary artery disease   . Cough   . Dysrhythmia   . Hypertension   . Lymphadenopathy, hilar   . MI (myocardial infarction)    x 2  . Wheezing     Past Surgical History:  Procedure Laterality Date  . BRONCHIAL NEEDLE ASPIRATION BIOPSY N/A 12/31/2014   Procedure: BRONCHIAL NEEDLE ASPIRATION BIOPSIES from carina;  Surgeon: Flora Lipps, MD;  Location: [facility] ORS;  Service: Cardiopulmonary;  Laterality: N/A;  . CARDIAC CATHETERIZATION N/A 12/28/2015   Procedure: Left Heart Cath and Cors/Grafts Angiography;  Surgeon: Yolonda Kida, MD;  Location: [place] CV LAB;  Service: Cardiovascular;  Laterality: N/A;  . CORONARY ANGIOPLASTY WITH STENT PLACEMENT    . CORONARY ARTERY BYPASS GRAFT    . ENDOBRONCHIAL ULTRASOUND N/A 12/31/2014   Procedure: ENDOBRONCHIAL ULTRASOUND;  Surgeon: Flora Lipps, MD;  Location: [facility] ORS;  Service: Cardiopulmonary;  Laterality: N/A;  . ESOPHAGOGASTRODUODENOSCOPY (EGD) WITH PROPOFOL N/A 06/20/2015   Procedure:  ESOPHAGOGASTRODUODENOSCOPY (EGD) WITH PROPOFOL;  Surgeon: Lollie Sails, MD;  Location: [facility] ENDOSCOPY;  Service: Endoscopy;  Laterality: N/A;    There were no vitals filed for this visit.      Subjective Assessment - 06/27/16 1610    Subjective Pt. reports he is doing well today with no pain. Pt. has not fallen since last visit.    Patient is accompained by: Family member   Pertinent History R foot drop beginning in June of 2017 due to spinal stenosis. Hx of chronic foot ulceration, R foot/leg cellulitis due to AFO use.   Limitations Walking;Standing;House hold activities   How long can you stand comfortably? current standing tolerance without support 7 minutes   Patient Stated Goals pt would like to improve his strength, endurance and balance so he can return to normal activity level   Currently in Pain? No/denies       There.Ex.: sit to stands with B hands on knees. Pt performed multiple trials of 5x STS from decreased height plinth table.  Pt. Able to complete 10x with blue table lowered to 19" with mod difficulty.  Stepping over and clearing 3" steps in //-bars with no assist Sidestepping in //-bars w/ cuing to maintain toes/hips forward Step up onto 6" step in // bars with two finger UE support and cues for upright posture. Occasional LOB during step down. SciFit L10 8 min B LE/UE (no charge)  Neuro: Pt. Completed BERG balance test again.  Overhead reaching for ball outside BOS 3x10 passes Airex pad: static 1x60 seconds, eyes closed 1x60 seconds posterior lean noted and increased trunk sway, marching with single UE assist 1x20 steps, weight shifting 1x60 seconds with cues for hip mechanics.   Gait Multiple trials of ambulating in PT clinic w/ no AD and PT CGA with gait belt.  Cuing to maintain proper BOS/ step length and upright posture.    LEFS completed: 41/80     Pt. Response to medical necessity: Pt. Will benefit from continued skilled PT to improve safe gait  mechanics and increase dynamic balance to promote independence with ADLs and increase functional mobility.          PT Long Term Goals - 06/27/16 1715      PT LONG TERM GOAL #1   Title Pt will score >45/56 on BERG balance assessment to increase functional mobility and decrease fall risk   Baseline 1/15: 47/56   Time 4   Period Weeks   Status Achieved     PT LONG TERM GOAL #2   Title Pt will complete LEFS outcome measure and score >40 out of 80 to improve functional mobility/ ADLs.   Baseline 06/27/16 LEFS: 41 out of 88. 1/15: LEFS 33 out of 80   Time 4   Period Weeks   Status Achieved     PT LONG TERM GOAL #3   Title Pt will increase LE strength 1/2 per MMT in hip flexion, knee extension, knee flexion to promote return to prior strength so that he can perform household duties independently   Baseline 2/14: B Hip flexion: 4+/5, Right knee extension 4/5, Left Knee extension 5/5, bilateral knee flexion 4/5. Prior date: B LE muscle weakness: B hip flexion (3+/5 MMT), B hip ext./IR (4-/5 MMT), R knee ext. (4-/5 MMT), L knee ext. (4+/5 MMT), R DF (1/5 MMT), L DF (4+/5 MMT), B knee flexion (4+/5 MMT).    Time 4   Period Weeks   Status Partially Met     PT LONG TERM GOAL #4   Title Pt will perform sit<>stand with unilateral UE assist or hands on knees in clinic chair to progress toward previous level of function   Baseline Performs STS with single UE assist from clinic chair. Performs sit to stand with hands on knees from seat>19 inches.    Time 4   Period Weeks   Status Achieved     PT LONG TERM GOAL #5   Title Pt will score >50/56 on BERG balance assessment to increase functional mobility and decrease fall risk   Baseline 1/15: [name] 47/56 2/14: [name] 45   Time 4   Period Weeks   Status On-going     Additional Long Term Goals   Additional Long Term Goals Yes     PT LONG TERM GOAL #6   Title Patient will perform sit to stand from clinic chair with hands on knees or no UE  assistance to increase independence in community.    Baseline Patient can perform sit to stand with hands on knees from chair >19 inches or use single UE assistance in clinic chair.    Time 4   Period Weeks   Status New     PT LONG TERM GOAL #7   Title Pt will complete LEFS outcome measure and score >50 out of 80 to improve functional mobility/ ADLs.   Baseline 2/14: 41/80   Time 4   Period  Weeks   Status New            Plan - July 01, 2016 1611    Clinical Impression Statement Patient presents to physical therapy session with improved functional balance. The [name] was performed and scored at a 45/56 due to difficulty with single limb stance.  Patient scored a 41 out of 80 on the LEFS demonstrating and increase in patient abilities in functional activities of daily living. The patient's balance is progressing as he is now able to be challenged outside of his BOS and regain his center of mass with limited times of loss of balance. The patient continues to be challenged by stepping onto and off of higher steps due to the single limb requirement of the task. Single limb stance and tandem stance is challenging to patient and will continue to be an area of focus for future sessions. Functional strength has improved and patient is able to maintain standing position for longer durations.  Patient will benefit from continued sit to stand training from lower surfaces as he is unable to stand without use of hands or hands on knees from lower surfaces (<19inches).  Patient will benefit from continued skilled physical therapy services for generalized strengthening, decreased fall risk, balance, functional transfers, and increased capacity for functional activity.    Rehab Potential Good   Clinical Impairments Affecting Rehab Potential Positive: family support, PLOF. Negative: dx, permanent nerve damage   PT Frequency 1x / week   PT Duration 4 weeks   PT Treatment/Interventions ADLs/Self Care Home  Management;Biofeedback;Cryotherapy;Electrical Stimulation;Gait training;Stair training;Functional mobility training;Therapeutic activities;Therapeutic exercise;Balance training;Neuromuscular re-education;Patient/family education;Orthotic Fit/Training;Manual techniques   PT Next Visit Plan Progressive strengthening and balance program.   Gait training to QC/ least assistive device.  CHECK GOALS/SCHEDULE NEXT TX SESSION   PT Home Exercise Plan review HEP   Consulted and Agree with Plan of Care Patient      Patient will benefit from skilled therapeutic intervention in order to improve the following deficits and impairments:  Abnormal gait, Decreased activity tolerance, Decreased balance, Decreased coordination, Decreased endurance, Decreased knowledge of use of DME, Decreased mobility, Decreased skin integrity, Decreased strength, Difficulty walking, Impaired perceived functional ability, Postural dysfunction  Visit Diagnosis: Difficulty in walking, not elsewhere classified  History of falling  Foot drop, right       G-Codes - 07/01/16 1832    Functional Assessment Tool Used Clinical impression/ gait difficulty/ Berg balance test/ muscle weakness   Functional Limitation Mobility: Walking and moving around   Mobility: Walking and Moving Around Current Status [id number]) At least 20 percent but less than 40 percent impaired, limited or restricted   Mobility: Walking and Moving Around Goal Status [phone]) At least 1 percent but less than 20 percent impaired, limited or restricted      Problem List Patient Active Problem List   Diagnosis Date Noted  . Chronic systolic heart failure ([place]) 01/17/2016  . Chewing tobacco use 01/17/2016  . Atrial fibrillation with RVR ([place]) 12/23/2015  . Dyspnea on exertion 12/23/2015  . Elevated troponin 12/23/2015  . GERD (gastroesophageal reflux disease) 12/23/2015  . Iron deficiency anemia due to chronic blood loss 12/20/2015  . Femoral neck fracture, left,  closed, initial encounter 11/03/2015  . Chronic atrial fibrillation ([place])   . Coronary artery disease involving native coronary artery of native heart without angina pectoris   . Acalculous cholecystitis 10/29/2015  . Adenopathy   . CAD (coronary artery disease) 12/22/2014  . HTN (hypertension) 12/22/2014   Pura Spice,  PT, DPT # [id number] Willodean Rosenthal, SPT 06/28/2016, 9:35 AM  [facility] [address] [place], [place], [address] Phone: [phone]   Fax:  [phone]  Name: MANCIL PFENNING MRN: [id number] Date of Birth: 04/11/1931

## 2016-06-28 ENCOUNTER — Encounter: Payer: Self-pay | Admitting: Physical Therapy

## 2016-07-04 ENCOUNTER — Encounter: Payer: Self-pay | Admitting: Physical Therapy

## 2016-07-04 ENCOUNTER — Ambulatory Visit: Payer: PPO | Admitting: Physical Therapy

## 2016-07-04 DIAGNOSIS — R262 Difficulty in walking, not elsewhere classified: Secondary | ICD-10-CM | POA: Diagnosis not present

## 2016-07-04 DIAGNOSIS — M21371 Foot drop, right foot: Secondary | ICD-10-CM

## 2016-07-04 DIAGNOSIS — Z9181 History of falling: Secondary | ICD-10-CM

## 2016-07-04 NOTE — Therapy (Signed)
[facility] [address] [place], [place], [address] Phone: [phone]   Fax:  [phone]  Physical Therapy Treatment  Patient Details  Name: Andres Gutierrez MRN: [id number] Date of Birth: 12-Sep-1930 Referring Provider: Emily Filbert MD  Encounter Date: 07/04/2016      PT End of Session - 07/04/16 1629    Visit Number 20   Number of Visits 23   Date for PT Re-Evaluation 07/25/16   Authorization - Visit Number 6   Authorization - Number of Visits 14   PT Start Time 4496   PT Stop Time 1648   PT Time Calculation (min) 55 min   Equipment Utilized During Treatment Gait belt   Activity Tolerance Patient tolerated treatment well   Behavior During Therapy [facility] for tasks assessed/performed      Past Medical History:  Diagnosis Date  . Anemia   . Anxiety   . Atrial fibrillation ([place])   . Cardiomyopathy ([place])   . CHF (congestive heart failure) ([place])   . Chronic kidney disease    kidney stones  . Coronary artery disease   . Cough   . Dysrhythmia   . Hypertension   . Lymphadenopathy, hilar   . MI (myocardial infarction)    x 2  . Wheezing     Past Surgical History:  Procedure Laterality Date  . BRONCHIAL NEEDLE ASPIRATION BIOPSY N/A 12/31/2014   Procedure: BRONCHIAL NEEDLE ASPIRATION BIOPSIES from carina;  Surgeon: Flora Lipps, MD;  Location: [facility] ORS;  Service: Cardiopulmonary;  Laterality: N/A;  . CARDIAC CATHETERIZATION N/A 12/28/2015   Procedure: Left Heart Cath and Cors/Grafts Angiography;  Surgeon: Yolonda Kida, MD;  Location: [place] CV LAB;  Service: Cardiovascular;  Laterality: N/A;  . CORONARY ANGIOPLASTY WITH STENT PLACEMENT    . CORONARY ARTERY BYPASS GRAFT    . ENDOBRONCHIAL ULTRASOUND N/A 12/31/2014   Procedure: ENDOBRONCHIAL ULTRASOUND;  Surgeon: Flora Lipps, MD;  Location: [facility] ORS;  Service: Cardiopulmonary;  Laterality: N/A;  . ESOPHAGOGASTRODUODENOSCOPY (EGD) WITH PROPOFOL N/A 06/20/2015   Procedure:  ESOPHAGOGASTRODUODENOSCOPY (EGD) WITH PROPOFOL;  Surgeon: Lollie Sails, MD;  Location: [facility] ENDOSCOPY;  Service: Endoscopy;  Laterality: N/A;    There were no vitals filed for this visit.      Subjective Assessment - 07/04/16 1556    Subjective Patient has not fallen since last visit but reports some gastrointestinal pain with no nausea.    Patient is accompained by: Family member   Pertinent History R foot drop beginning in June of 2017 due to spinal stenosis. Hx of chronic foot ulceration, R foot/leg cellulitis due to AFO use.   Limitations Walking;Standing;House hold activities   How long can you stand comfortably? current standing tolerance without support 7 minutes   Patient Stated Goals pt would like to improve his strength, endurance and balance so he can return to normal activity level   Currently in Pain? No/denies     OBJECTIVE  Neuro: Ambulating without UE assist in // bars 4x with cues for upright posture High knee marching in // bars with occasional UE assist 4x. Side stepping in // bars and single UE assist 4x. Tandem walking on line in // bars 4x with UE assistance. Standing on airex w/ alternating arm raises. Tandem balance on airex no assist/CGA. Backwards walking in //-bars . Standing marching w/ single hand assist on rail. Step forward w/ alternating big hands 10x R/L   Gait Training: Ambulating w/ QC in PT clinic and hallway  several trials ~500+ ft. Pt. Experienced no major LOB with min. Cuing for step sequencing w/ QC Cone negotiation w/ Qc. Pt. Able to ascend/descend 3" curb with proper sequencing and cuing to maintain good posture throughout. Pt. Tended to shift weight forward when he was more fatigued and was reminded to keep body weight evenly distributed and head up looking 10-15 ft. In front of him. Ascending/descending stairs multiple trials safely with QC - pt. Instructed for proper sequencing with QC and for safety     Pt. Response to medical necessity:  Pt. Will benefit from continued skilled PT to improve safe gait mechanics and increase dynamic balance to promote independence with ADLs and increase functional mobility.       PT Long Term Goals - 06/27/16 1715      PT LONG TERM GOAL #1   Title Pt will score >45/56 on BERG balance assessment to increase functional mobility and decrease fall risk   Baseline 1/15: 47/56   Time 4   Period Weeks   Status Achieved     PT LONG TERM GOAL #2   Title Pt will complete LEFS outcome measure and score >40 out of 80 to improve functional mobility/ ADLs.   Baseline 06/27/16 LEFS: 41 out of 88. 1/15: LEFS 33 out of 80   Time 4   Period Weeks   Status Achieved     PT LONG TERM GOAL #3   Title Pt will increase LE strength 1/2 per MMT in hip flexion, knee extension, knee flexion to promote return to prior strength so that he can perform household duties independently   Baseline 2/14: B Hip flexion: 4+/5, Right knee extension 4/5, Left Knee extension 5/5, bilateral knee flexion 4/5. Prior date: B LE muscle weakness: B hip flexion (3+/5 MMT), B hip ext./IR (4-/5 MMT), R knee ext. (4-/5 MMT), L knee ext. (4+/5 MMT), R DF (1/5 MMT), L DF (4+/5 MMT), B knee flexion (4+/5 MMT).    Time 4   Period Weeks   Status Partially Met     PT LONG TERM GOAL #4   Title Pt will perform sit<>stand with unilateral UE assist or hands on knees in clinic chair to progress toward previous level of function   Baseline Performs STS with single UE assist from clinic chair. Performs sit to stand with hands on knees from seat>19 inches.    Time 4   Period Weeks   Status Achieved     PT LONG TERM GOAL #5   Title Pt will score >50/56 on BERG balance assessment to increase functional mobility and decrease fall risk   Baseline 1/15: Berg 47/56 2/14: Berg 45   Time 4   Period Weeks   Status On-going     Additional Long Term Goals   Additional Long Term Goals Yes     PT LONG TERM GOAL #6   Title Patient will perform sit to  stand from clinic chair with hands on knees or no UE assistance to increase independence in community.    Baseline Patient can perform sit to stand with hands on knees from chair >19 inches or use single UE assistance in clinic chair.    Time 4   Period Weeks   Status New     PT LONG TERM GOAL #7   Title Pt will complete LEFS outcome measure and score >50 out of 80 to improve functional mobility/ ADLs.   Baseline 2/14: 41/80   Time 4   Period Weeks     Status New            Plan - 07/04/16 1631    Clinical Impression Statement Pt. presented to physical therapy clinic with RW. Pt. ambulated with QC in clinic for the first time in several months (12/17) and maintained proper BOS and balance throughout session. Pt. instructed on step sequencing with QC held in left hand and adjusted for proper height. Pt. ambulated over uneven surfaces and practiced stepping up and down from a 3" curb. Pt. negotiated turns through cones placed on the floor and ambulated down hallway for sustained distances. Pt. is continuing to progress with standing static and dynamic balance focusing on uneven surfaces and using as min. A as possible.  Pt. will benefit from skilled PT to improve safe gait mechanics with QC and increase dynamic balance to promote safe and functional mobility.    Rehab Potential Good   Clinical Impairments Affecting Rehab Potential Positive: family support, PLOF. Negative: dx, permanent nerve damage   PT Frequency 1x / week   PT Duration 4 weeks   PT Treatment/Interventions ADLs/Self Care Home Management;Biofeedback;Cryotherapy;Electrical Stimulation;Gait training;Stair training;Functional mobility training;Therapeutic activities;Therapeutic exercise;Balance training;Neuromuscular re-education;Patient/family education;Orthotic Fit/Training;Manual techniques   PT Next Visit Plan Progressive strengthening and balance program.   Gait training to QC/ least assistive device.  CHECK GOALS/SCHEDULE  NEXT TX SESSION   PT Home Exercise Plan review HEP   Consulted and Agree with Plan of Care Patient      Patient will benefit from skilled therapeutic intervention in order to improve the following deficits and impairments:  Abnormal gait, Decreased activity tolerance, Decreased balance, Decreased coordination, Decreased endurance, Decreased knowledge of use of DME, Decreased mobility, Decreased skin integrity, Decreased strength, Difficulty walking, Impaired perceived functional ability, Postural dysfunction  Visit Diagnosis: Difficulty in walking, not elsewhere classified  History of falling  Foot drop, right     Problem List Patient Active Problem List   Diagnosis Date Noted  . Chronic systolic heart failure ([place]) 01/17/2016  . Chewing tobacco use 01/17/2016  . Atrial fibrillation with RVR ([place]) 12/23/2015  . Dyspnea on exertion 12/23/2015  . Elevated troponin 12/23/2015  . GERD (gastroesophageal reflux disease) 12/23/2015  . Iron deficiency anemia due to chronic blood loss 12/20/2015  . Femoral neck fracture, left, closed, initial encounter 11/03/2015  . Chronic atrial fibrillation ([place])   . Coronary artery disease involving native coronary artery of native heart without angina pectoris   . Acalculous cholecystitis 10/29/2015  . Adenopathy   . CAD (coronary artery disease) 12/22/2014  . HTN (hypertension) 12/22/2014   Pura Spice, PT, DPT # [address] SPT 07/05/2016, 8:41 AM  [facility] [facility] [address] [place], [place], [address] Phone: [phone]   Fax:  [phone]  Name: Andres Gutierrez MRN: [id number] Date of Birth: [birth year]

## 2016-07-11 ENCOUNTER — Ambulatory Visit: Payer: PPO | Admitting: Physical Therapy

## 2016-07-11 DIAGNOSIS — M21371 Foot drop, right foot: Secondary | ICD-10-CM

## 2016-07-11 DIAGNOSIS — Z9181 History of falling: Secondary | ICD-10-CM

## 2016-07-11 DIAGNOSIS — R262 Difficulty in walking, not elsewhere classified: Secondary | ICD-10-CM | POA: Diagnosis not present

## 2016-07-11 NOTE — Therapy (Signed)
[facility] [address]. [place], [place], [address] Phone: [phone]   Fax:  [phone]  Physical Therapy Treatment  Patient Details  Name: Andres Gutierrez MRN: [id number] Date of Birth: 05/09/1931 Referring Provider: Emily Filbert MD  Encounter Date: 07/11/2016      PT End of Session - 07/11/16 1745    Visit Number 21   Number of Visits 23   Date for PT Re-Evaluation 07/25/16   Authorization - Visit Number 7   Authorization - Number of Visits 14   PT Start Time 1517   PT Stop Time 1657   PT Time Calculation (min) 62 min   Equipment Utilized During Treatment Gait belt   Activity Tolerance Patient tolerated treatment well   Behavior During Therapy [facility] for tasks assessed/performed      Past Medical History:  Diagnosis Date  . Anemia   . Anxiety   . Atrial fibrillation ([place])   . Cardiomyopathy ([place])   . CHF (congestive heart failure) ([place])   . Chronic kidney disease    kidney stones  . Coronary artery disease   . Cough   . Dysrhythmia   . Hypertension   . Lymphadenopathy, hilar   . MI (myocardial infarction)    x 2  . Wheezing     Past Surgical History:  Procedure Laterality Date  . BRONCHIAL NEEDLE ASPIRATION BIOPSY N/A 12/31/2014   Procedure: BRONCHIAL NEEDLE ASPIRATION BIOPSIES from carina;  Surgeon: Flora Lipps, MD;  Location: [facility] ORS;  Service: Cardiopulmonary;  Laterality: N/A;  . CARDIAC CATHETERIZATION N/A 12/28/2015   Procedure: Left Heart Cath and Cors/Grafts Angiography;  Surgeon: Yolonda Kida, MD;  Location: [place] CV LAB;  Service: Cardiovascular;  Laterality: N/A;  . CORONARY ANGIOPLASTY WITH STENT PLACEMENT    . CORONARY ARTERY BYPASS GRAFT    . ENDOBRONCHIAL ULTRASOUND N/A 12/31/2014   Procedure: ENDOBRONCHIAL ULTRASOUND;  Surgeon: Flora Lipps, MD;  Location: [facility] ORS;  Service: Cardiopulmonary;  Laterality: N/A;  . ESOPHAGOGASTRODUODENOSCOPY (EGD) WITH PROPOFOL N/A 06/20/2015   Procedure:  ESOPHAGOGASTRODUODENOSCOPY (EGD) WITH PROPOFOL;  Surgeon: Lollie Sails, MD;  Location: [facility] ENDOSCOPY;  Service: Endoscopy;  Laterality: N/A;    There were no vitals filed for this visit.      Subjective Assessment - 07/11/16 1744    Subjective Patient has not fallen since last visit and reports feeling fine for this session.    Patient is accompained by: Family member   Pertinent History R foot drop beginning in June of 2017 due to spinal stenosis. Hx of chronic foot ulceration, R foot/leg cellulitis due to AFO use.   Limitations Walking;Standing;House hold activities   How long can you stand comfortably? current standing tolerance without support 7 minutes   Patient Stated Goals pt would like to improve his strength, endurance and balance so he can return to normal activity level   Currently in Pain? No/denies       Neuro Re-ed:  Ambulating without UE assist in // bars 4x with cues for upright posture and balance High knee marching in // bars with occasional UE assist 4x with focus on single limb stance Standing on airex w/ alternating arm raises 10x,. Marching on Airex pad with single UE support 1x60 sec  Stepping over 3" steps with progressively less UE support (BUE, SUE, no U E). X6. Stepping over with quad cane and cues for cane placement x6.   SciFit 10 min lvl 6 (no charge)   Gait Training:  Ambulating with Quad cane outside on uneven ground, ascend/descend ramp with no LOB, good postural control. Upright posture, heel strike, and proper BOS noted.  Ambulating w/ QC in PT clinic and hallway several trials ~500+ ft. Pt. Experienced no LOB with min. Cuing for step sequencing w/ Qc.  Ambulating with SPT in PT clinic 2x40 ft. Pt. Does not demonstrate good body mechanics: forward weight shift, decreased step length, and decreased speed. Required frequent cueing for upright posture and gait mechanics. Cone negotiation w/ Qc. Pproper sequencing and cuing to maintain good posture  throughout. Pt. Tended to shift weight forward when he was more fatigued and was reminded to keep body weight evenly distributed and head up looking 10-15 ft. In front of him.   Pt. Response to medical necessity: Pt. Will benefit from continued skilled PT to improve safe gait mechanics and increase dynamic balance to promote independence with ADLs and increase functional mobility.         PT Education - 07/11/16 1745    Education provided Yes   Education Details ambulating with quad cane   Person(s) Educated Patient   Methods Explanation;Demonstration   Comprehension Verbalized understanding             PT Long Term Goals - 06/27/16 1715      PT LONG TERM GOAL #1   Title Pt will score >45/56 on BERG balance assessment to increase functional mobility and decrease fall risk   Baseline 1/15: 47/56   Time 4   Period Weeks   Status Achieved     PT LONG TERM GOAL #2   Title Pt will complete LEFS outcome measure and score >40 out of 80 to improve functional mobility/ ADLs.   Baseline 06/27/16 LEFS: 41 out of 88. 1/15: LEFS 33 out of 80   Time 4   Period Weeks   Status Achieved     PT LONG TERM GOAL #3   Title Pt will increase LE strength 1/2 per MMT in hip flexion, knee extension, knee flexion to promote return to prior strength so that he can perform household duties independently   Baseline 2/14: B Hip flexion: 4+/5, Right knee extension 4/5, Left Knee extension 5/5, bilateral knee flexion 4/5. Prior date: B LE muscle weakness: B hip flexion (3+/5 MMT), B hip ext./IR (4-/5 MMT), R knee ext. (4-/5 MMT), L knee ext. (4+/5 MMT), R DF (1/5 MMT), L DF (4+/5 MMT), B knee flexion (4+/5 MMT).    Time 4   Period Weeks   Status Partially Met     PT LONG TERM GOAL #4   Title Pt will perform sit<>stand with unilateral UE assist or hands on knees in clinic chair to progress toward previous level of function   Baseline Performs STS with single UE assist from clinic chair. Performs sit to  stand with hands on knees from seat>19 inches.    Time 4   Period Weeks   Status Achieved     PT LONG TERM GOAL #5   Title Pt will score >50/56 on BERG balance assessment to increase functional mobility and decrease fall risk   Baseline 1/15: [name] 47/56 2/14: [name] 45   Time 4   Period Weeks   Status On-going     Additional Long Term Goals   Additional Long Term Goals Yes     PT LONG TERM GOAL #6   Title Patient will perform sit to stand from clinic chair with hands on knees or no UE assistance to  increase independence in community.    Baseline Patient can perform sit to stand with hands on knees from chair >19 inches or use single UE assistance in clinic chair.    Time 4   Period Weeks   Status New     PT LONG TERM GOAL #7   Title Pt will complete LEFS outcome measure and score >50 out of 80 to improve functional mobility/ ADLs.   Baseline 2/14: 41/80   Time 4   Period Weeks   Status New            Plan - 07/11/16 1749    Clinical Impression Statement Patient has demonstrated improved ability to ambulate and negotiate obstacles with quad cane. Quad cane was lent to pt. to implement at home this week and bring back next week. Outside ambulation with quad cane resulted in no LOB and patient continues to be more confident with ambulation. Ambulating with SPC resulted with decreased gait mechanics. Pt. is continuing to progress with standing static and dynamic balance. Pt. will benefit from continued skilled physical therapy to improve safe gait mechanics with QC, increase dynamic balance, and promote safe and functional mobility.    Rehab Potential Good   Clinical Impairments Affecting Rehab Potential Positive: family support, PLOF. Negative: dx, permanent nerve damage   PT Frequency 1x / week   PT Duration 4 weeks   PT Treatment/Interventions ADLs/Self Care Home Management;Biofeedback;Cryotherapy;Electrical Stimulation;Gait training;Stair training;Functional mobility  training;Therapeutic activities;Therapeutic exercise;Balance training;Neuromuscular re-education;Patient/family education;Orthotic Fit/Training;Manual techniques   PT Next Visit Plan Progressive strengthening and balance program.   Gait training to QC/ least assistive device.  CHECK GOALS/SCHEDULE NEXT TX SESSION   PT Home Exercise Plan dc soon, quad cane   Consulted and Agree with Plan of Care Patient      Patient will benefit from skilled therapeutic intervention in order to improve the following deficits and impairments:  Abnormal gait, Decreased activity tolerance, Decreased balance, Decreased coordination, Decreased endurance, Decreased knowledge of use of DME, Decreased mobility, Decreased skin integrity, Decreased strength, Difficulty walking, Impaired perceived functional ability, Postural dysfunction  Visit Diagnosis: Difficulty in walking, not elsewhere classified  History of falling  Foot drop, right     Problem List Patient Active Problem List   Diagnosis Date Noted  . Chronic systolic heart failure ([place]) 01/17/2016  . Chewing tobacco use 01/17/2016  . Atrial fibrillation with RVR ([place]) 12/23/2015  . Dyspnea on exertion 12/23/2015  . Elevated troponin 12/23/2015  . GERD (gastroesophageal reflux disease) 12/23/2015  . Iron deficiency anemia due to chronic blood loss 12/20/2015  . Femoral neck fracture, left, closed, initial encounter 11/03/2015  . Chronic atrial fibrillation ([place])   . Coronary artery disease involving native coronary artery of native heart without angina pectoris   . Acalculous cholecystitis 10/29/2015  . Adenopathy   . CAD (coronary artery disease) 12/22/2014  . HTN (hypertension) 12/22/2014   Pura Spice, PT, DPT # [id number] Janna Arch, SPT 07/11/2016, 5:51 PM  [facility] [address] [place], [place], [address] Phone: [phone]   Fax:  [phone]  Name: Andres Gutierrez MRN: [id number] Date  of Birth: 1930-08-31

## 2016-07-18 ENCOUNTER — Ambulatory Visit: Payer: PPO | Attending: Internal Medicine | Admitting: Physical Therapy

## 2016-07-18 DIAGNOSIS — Z9181 History of falling: Secondary | ICD-10-CM | POA: Diagnosis not present

## 2016-07-18 DIAGNOSIS — M21371 Foot drop, right foot: Secondary | ICD-10-CM | POA: Insufficient documentation

## 2016-07-18 DIAGNOSIS — R262 Difficulty in walking, not elsewhere classified: Secondary | ICD-10-CM | POA: Diagnosis not present

## 2016-07-18 NOTE — Therapy (Signed)
[facility] [address] [place], [place], [address] Phone: [phone]   Fax:  [phone]  Physical Therapy Treatment  Patient Details  Name: Andres Gutierrez MRN: [id number] Date of Birth: [birth year] Referring Provider: Emily Filbert MD  Encounter Date: 07/18/2016      PT End of Session - 07/18/16 1738    Visit Number 22   Number of Visits 23   Date for PT Re-Evaluation 07/25/16   Authorization - Visit Number 8   Authorization - Number of Visits 14   PT Start Time 9449   PT Stop Time 1648   PT Time Calculation (min) 55 min   Equipment Utilized During Treatment Gait belt   Activity Tolerance Patient tolerated treatment well   Behavior During Therapy [facility] for tasks assessed/performed      Past Medical History:  Diagnosis Date  . Anemia   . Anxiety   . Atrial fibrillation ([place])   . Cardiomyopathy ([place])   . CHF (congestive heart failure) ([place])   . Chronic kidney disease    kidney stones  . Coronary artery disease   . Cough   . Dysrhythmia   . Hypertension   . Lymphadenopathy, hilar   . MI (myocardial infarction)    x 2  . Wheezing     Past Surgical History:  Procedure Laterality Date  . BRONCHIAL NEEDLE ASPIRATION BIOPSY N/A 12/31/2014   Procedure: BRONCHIAL NEEDLE ASPIRATION BIOPSIES from carina;  Surgeon: Flora Lipps, MD;  Location: [facility] ORS;  Service: Cardiopulmonary;  Laterality: N/A;  . CARDIAC CATHETERIZATION N/A 12/28/2015   Procedure: Left Heart Cath and Cors/Grafts Angiography;  Surgeon: Yolonda Kida, MD;  Location: [place] CV LAB;  Service: Cardiovascular;  Laterality: N/A;  . CORONARY ANGIOPLASTY WITH STENT PLACEMENT    . CORONARY ARTERY BYPASS GRAFT    . ENDOBRONCHIAL ULTRASOUND N/A 12/31/2014   Procedure: ENDOBRONCHIAL ULTRASOUND;  Surgeon: Flora Lipps, MD;  Location: [facility] ORS;  Service: Cardiopulmonary;  Laterality: N/A;  . ESOPHAGOGASTRODUODENOSCOPY (EGD) WITH PROPOFOL N/A 06/20/2015   Procedure:  ESOPHAGOGASTRODUODENOSCOPY (EGD) WITH PROPOFOL;  Surgeon: Lollie Sails, MD;  Location: [facility] ENDOSCOPY;  Service: Endoscopy;  Laterality: N/A;    There were no vitals filed for this visit.      Subjective Assessment - 07/18/16 1556    Subjective Patient walked into 2 stores the day prior with the quad cane, 3 stores today with quad cane. Pt. has been walking outside with quad cane.    Patient is accompained by: Family member   Pertinent History R foot drop beginning in June of 2017 due to spinal stenosis. Hx of chronic foot ulceration, R foot/leg cellulitis due to AFO use.   Limitations Walking;Standing;House hold activities   How long can you stand comfortably? current standing tolerance without support 7 minutes   Patient Stated Goals pt would like to improve his strength, endurance and balance so he can return to normal activity level   Currently in Pain? No/denies      TherEx  SciFit Lvl 9.0 10 minutes Walking marches in // bars x6 . Improved with repetition, good knee height clearance Side stepping in // bars 4x with no UE support.   Neuro Re-ed Airex pad: standing throwing balls at target 4x, tandem stance throwing bals at target 4x. Single limb stance: unable to perform without using pinky-10 seconds L, 14 seconds R Obstacle course 1 performed 2x w QC: Stepping over 6" step, cone taps zig zagging between 5 cones,  Airex pad, side step cone taps.   Gait (w QC) Obstacle course negotiation 2 5x: Stepping up and over 3" step (curb), over [place]x pad (unstable surface), zig zagging around 6 cones with focus on turns in safe manner. Cueing for step sequence with QC for safe object negotiation Obstacle course negotiation 3 3x: Stepping over 6" step (higher curb, tree branch, shower) x2, walking over two Airex pads in a row (dynamic surface-grass), zig zagging between 5 cones practicing sharp turns with QC while retaining balance and BOS. Required frequent cues to lift head to look  10-15 ft in front of him.    Pt. Response to medical necessity: Pt. Will benefit from continued skilled PT to improve safe gait mechanics and increase dynamic balance to promote independence with ADLs and increase functional mobility.        PT Long Term Goals - 06/27/16 1715      PT LONG TERM GOAL #1   Title Pt will score >45/56 on BERG balance assessment to increase functional mobility and decrease fall risk   Baseline 1/15: 47/56   Time 4   Period Weeks   Status Achieved     PT LONG TERM GOAL #2   Title Pt will complete LEFS outcome measure and score >40 out of 80 to improve functional mobility/ ADLs.   Baseline 06/27/16 LEFS: 41 out of 88. 1/15: LEFS 33 out of 80   Time 4   Period Weeks   Status Achieved     PT LONG TERM GOAL #3   Title Pt will increase LE strength 1/2 per MMT in hip flexion, knee extension, knee flexion to promote return to prior strength so that he can perform household duties independently   Baseline 2/14: B Hip flexion: 4+/5, Right knee extension 4/5, Left Knee extension 5/5, bilateral knee flexion 4/5. Prior date: B LE muscle weakness: B hip flexion (3+/5 MMT), B hip ext./IR (4-/5 MMT), R knee ext. (4-/5 MMT), L knee ext. (4+/5 MMT), R DF (1/5 MMT), L DF (4+/5 MMT), B knee flexion (4+/5 MMT).    Time 4   Period Weeks   Status Partially Met     PT LONG TERM GOAL #4   Title Pt will perform sit<>stand with unilateral UE assist or hands on knees in clinic chair to progress toward previous level of function   Baseline Performs STS with single UE assist from clinic chair. Performs sit to stand with hands on knees from seat>19 inches.    Time 4   Period Weeks   Status Achieved     PT LONG TERM GOAL #5   Title Pt will score >50/56 on BERG balance assessment to increase functional mobility and decrease fall risk   Baseline 1/15: Andres Gutierrez 47/56 2/14: Andres Gutierrez 45   Time 4   Period Weeks   Status On-going     Additional Long Term Goals   Additional Long Term Goals  Yes     PT LONG TERM GOAL #6   Title Patient will perform sit to stand from clinic chair with hands on knees or no UE assistance to increase independence in community.    Baseline Patient can perform sit to stand with hands on knees from chair >19 inches or use single UE assistance in clinic chair.    Time 4   Period Weeks   Status New     PT LONG TERM GOAL #7   Title Pt will complete LEFS outcome measure and score >50 out of 80  to improve functional mobility/ ADLs.   Baseline 2/14: 41/80   Time 4   Period Weeks   Status New               Plan - 07/18/16 1745    Clinical Impression Statement Patient presented to physical therapy session carrying assistive devices. Pt. has utilized quad cane in community for short durations with positive outcomes. Ambulation with quad cane has improved as well as his ability to negotiate obstacles. Multiple obstacle courses designed to challenge the pt.'s balance, ambulatory skills, and object negotiation skills were performed with good technique and only occasional LOB. Patient continues to be limited with single limb stance requiring a pinky for support. Dynamic balance with quad cane is improved and patient performed Airex activities, cone taps, and unstable surfaces to challenge it. Patient will continue to benefit from skilled physical therapy to progress safe gait mechanics with quad cane, increase dynamic and single limb balance, and promote safe and functional mobility.    Rehab Potential Good   Clinical Impairments Affecting Rehab Potential Positive: family support, PLOF. Negative: dx, permanent nerve damage   PT Frequency 1x / week   PT Duration 4 weeks   PT Treatment/Interventions ADLs/Self Care Home Management;Biofeedback;Cryotherapy;Electrical Stimulation;Gait training;Stair training;Functional mobility training;Therapeutic activities;Therapeutic exercise;Balance training;Neuromuscular re-education;Patient/family education;Orthotic  Fit/Training;Manual techniques   PT Next Visit Plan Progressive strengthening and balance program.   Gait training to QC/ least assistive device.  CHECK GOALS/SCHEDULE NEXT TX SESSION   PT Home Exercise Plan RECERT vs DC   Consulted and Agree with Plan of Care Patient      Patient will benefit from skilled therapeutic intervention in order to improve the following deficits and impairments:  Abnormal gait, Decreased activity tolerance, Decreased balance, Decreased coordination, Decreased endurance, Decreased knowledge of use of DME, Decreased mobility, Decreased skin integrity, Decreased strength, Difficulty walking, Impaired perceived functional ability, Postural dysfunction  Visit Diagnosis: Difficulty in walking, not elsewhere classified  History of falling  Foot drop, right     Problem List Patient Active Problem List   Diagnosis Date Noted  . Chronic systolic heart failure ([place]) 01/17/2016  . Chewing tobacco use 01/17/2016  . Atrial fibrillation with RVR ([place]) 12/23/2015  . Dyspnea on exertion 12/23/2015  . Elevated troponin 12/23/2015  . GERD (gastroesophageal reflux disease) 12/23/2015  . Iron deficiency anemia due to chronic blood loss 12/20/2015  . Femoral neck fracture, left, closed, initial encounter 11/03/2015  . Chronic atrial fibrillation ([place])   . Coronary artery disease involving native coronary artery of native heart without angina pectoris   . Acalculous cholecystitis 10/29/2015  . Adenopathy   . CAD (coronary artery disease) 12/22/2014  . HTN (hypertension) 12/22/2014   Pura Spice, PT, DPT # [id number] Janna Arch, SPT 07/19/2016, 8:53 AM  [facility] [address] [place], [place], [address] Phone: [phone]   Fax:  [phone]  Name: Andres Gutierrez MRN: [id number] Date of Birth: [birth year]

## 2016-07-19 ENCOUNTER — Encounter: Payer: Self-pay | Admitting: Physical Therapy

## 2016-07-23 DIAGNOSIS — H2512 Age-related nuclear cataract, left eye: Secondary | ICD-10-CM | POA: Diagnosis not present

## 2016-07-25 ENCOUNTER — Ambulatory Visit: Payer: PPO | Admitting: Physical Therapy

## 2016-07-25 DIAGNOSIS — R262 Difficulty in walking, not elsewhere classified: Secondary | ICD-10-CM | POA: Diagnosis not present

## 2016-07-25 DIAGNOSIS — M21371 Foot drop, right foot: Secondary | ICD-10-CM

## 2016-07-25 DIAGNOSIS — Z9181 History of falling: Secondary | ICD-10-CM

## 2016-07-25 NOTE — Therapy (Signed)
[facility] [address] [place], [place], [address] Phone: [phone]   Fax:  [phone]  Physical Therapy Treatment  Patient Details  Name: Andres Gutierrez MRN: [id number] Date of Birth: [birth year] Referring Provider: Emily Filbert MD  Encounter Date: 07/25/2016      PT End of Session - 07/25/16 1938    Visit Number 23   Number of Visits 23   Date for PT Re-Evaluation 07/25/16   Authorization - Visit Number 9   Authorization - Number of Visits 14   PT Start Time 7017   PT Stop Time 1657   PT Time Calculation (min) 63 min   Equipment Utilized During Treatment Gait belt   Activity Tolerance Patient tolerated treatment well   Behavior During Therapy [facility] for tasks assessed/performed      Past Medical History:  Diagnosis Date  . Anemia   . Anxiety   . Atrial fibrillation ([place])   . Cardiomyopathy ([place])   . CHF (congestive heart failure) ([place])   . Chronic kidney disease    kidney stones  . Coronary artery disease   . Cough   . Dysrhythmia   . Hypertension   . Lymphadenopathy, hilar   . MI (myocardial infarction)    x 2  . Wheezing     Past Surgical History:  Procedure Laterality Date  . BRONCHIAL NEEDLE ASPIRATION BIOPSY N/A 12/31/2014   Procedure: BRONCHIAL NEEDLE ASPIRATION BIOPSIES from carina;  Surgeon: Flora Lipps, MD;  Location: [facility] ORS;  Service: Cardiopulmonary;  Laterality: N/A;  . CARDIAC CATHETERIZATION N/A 12/28/2015   Procedure: Left Heart Cath and Cors/Grafts Angiography;  Surgeon: Yolonda Kida, MD;  Location: [place] [facility];  Service: Cardiovascular;  Laterality: N/A;  . CORONARY ANGIOPLASTY WITH STENT PLACEMENT    . CORONARY ARTERY BYPASS GRAFT    . ENDOBRONCHIAL ULTRASOUND N/A 12/31/2014   Procedure: ENDOBRONCHIAL ULTRASOUND;  Surgeon: Flora Lipps, MD;  Location: [facility] ORS;  Service: Cardiopulmonary;  Laterality: N/A;  . ESOPHAGOGASTRODUODENOSCOPY (EGD) WITH PROPOFOL N/A 06/20/2015   Procedure:  ESOPHAGOGASTRODUODENOSCOPY (EGD) WITH PROPOFOL;  Surgeon: Lollie Sails, MD;  Location: [facility] ENDOSCOPY;  Service: Endoscopy;  Laterality: N/A;    There were no vitals filed for this visit.      Subjective Assessment - 07/25/16 1604    Subjective Patient is continuing ambulating in the community with quad cane. He has a new quad cane that his daughter purchased for him.    Patient is accompained by: Family member   Pertinent History R foot drop beginning in June of 2017 due to spinal stenosis. Hx of chronic foot ulceration, R foot/leg cellulitis due to AFO use.   Limitations Walking;Standing;House hold activities   How long can you stand comfortably? current standing tolerance without support 7 minutes   Patient Stated Goals pt would like to improve his strength, endurance and balance so he can return to normal activity level   Currently in Pain? No/denies     Neuro BERG: 45/56 Standing on Airex pad multiple trials no LOB Ambulating over dynamic surfaces 4x with no LOB  TherEx SciFit Lvl 9.0 10 minutes Sit to stand 5x no use of hands. Performed well.   Gait Obstacle course 1 performed 4x w QC: Stepping over two consecutive 3" step, pivoting between 5 cones, Airex pad,  Obstacle course 2: performed 3x w QC Stepping over two consecutive 3" step,  zig zagging around and between 5 cones, Airex pad,  Ambulating in hallway with  QC with cues for AD sequencing. Upright posture. Ambulating to cone, pivoting and ambulating to another cone with PT calling color of cone to ambulate to to increase functional mobility performance in community.  Patient has improved community mobility and will be focusing on a more independent home based program with follow up.        PT Long Term Goals - 07/25/16 1945      PT LONG TERM GOAL #1   Title Pt will score >45/56 on BERG balance assessment to increase functional mobility and decrease fall risk   Baseline 1/15: 47/56   Time 4   Period Weeks    Status Achieved     PT LONG TERM GOAL #2   Title Pt will complete LEFS outcome measure and score >40 out of 80 to improve functional mobility/ ADLs.   Baseline 06/27/16 LEFS: 41 out of 88. 1/15: LEFS 33 out of 80   Time 4   Period Weeks   Status Achieved     PT LONG TERM GOAL #3   Title Pt will increase LE strength 1/2 per MMT in hip flexion, knee extension, knee flexion to promote return to prior strength so that he can perform household duties independently   Baseline Met. strength improved to gross 4+/5   Time 4   Period Weeks   Status Achieved     PT LONG TERM GOAL #4   Title Pt will perform sit<>stand with unilateral UE assist or hands on knees in clinic chair to progress toward previous level of function   Baseline Performs STS with single UE assist from clinic chair. Performs sit to stand with hands on knees from seat>19 inches.    Time 4   Period Weeks   Status Achieved     PT LONG TERM GOAL #5   Title Pt will score >50/56 on BERG balance assessment to increase functional mobility and decrease fall risk   Baseline 1/15: Merrilee Gutierrez 47/56 2/14: Berg 45 3/14 45   Time 4   Period Weeks   Status Partially Met     PT LONG TERM GOAL #6   Title Patient will perform sit to stand from clinic chair with hands on knees or no UE assistance to increase independence in community.    Baseline Perform sit to stand with hands on knees independently from clinic chair   Time 4   Period Weeks   Status Achieved     PT LONG TERM GOAL #7   Title Pt will complete LEFS outcome measure and score >50 out of 80 to improve functional mobility/ ADLs.   Baseline 2/14: 41/80 3/14: 32   Time 4   Period Weeks   Status Not Met               Plan - 07/25/16 1945    Clinical Impression Statement Patient presents to physical therapy with continued progression of community mobility with quad cane. Pt. has utilized quad cane in community ambulation for short durations with continued positive outcomes.  Negotiation of obstacle courses was performed with no LOB, good body mechanics, good gait mechanics. BERG was performed and scored 45 with difficulty with single limb stance, tandem stance, and speed of activities. Ambulating through hallway and obstacles had no LOB with quad cane and good body mechanics for duration of ambulation. Static balance on Airex pad was not challenging to patient and patient was able to perform multiple trials without LOB. Pt. continues to grow with confidence in self and  mobility. Daughter and pt. discussed increasing patient's independence with therapy due to positive carryover with quad cane.    Rehab Potential Good   Clinical Impairments Affecting Rehab Potential Positive: family support, PLOF. Negative: dx, permanent nerve damage   PT Frequency 1x / week   PT Duration 4 weeks   PT Treatment/Interventions ADLs/Self Care Home Management;Biofeedback;Cryotherapy;Electrical Stimulation;Gait training;Stair training;Functional mobility training;Therapeutic activities;Therapeutic exercise;Balance training;Neuromuscular re-education;Patient/family education;Orthotic Fit/Training;Manual techniques   PT Next Visit Plan call in week   Consulted and Agree with Plan of Care Patient      Patient will benefit from skilled therapeutic intervention in order to improve the following deficits and impairments:  Abnormal gait, Decreased activity tolerance, Decreased balance, Decreased coordination, Decreased endurance, Decreased knowledge of use of DME, Decreased mobility, Decreased skin integrity, Decreased strength, Difficulty walking, Impaired perceived functional ability, Postural dysfunction  Visit Diagnosis: Difficulty in walking, not elsewhere classified  History of falling  Foot drop, right       G-Codes - 08/24/16 1631    Functional Assessment Tool Used (Outpatient Only) Clinical impression/ gait difficulty/ Berg balance test/ muscle weakness   Functional Limitation  Mobility: Walking and moving around   Mobility: Walking and Moving Around Current Status [phone]) At least 1 percent but less than 20 percent impaired, limited or restricted   Mobility: Walking and Moving Around Goal Status [phone]) At least 1 percent but less than 20 percent impaired, limited or restricted   Mobility: Walking and Moving Around Discharge Status [phone]) At least 1 percent but less than 20 percent impaired, limited or restricted      Problem List Patient Active Problem List   Diagnosis Date Noted  . Chronic systolic heart failure ([place]) 01/17/2016  . Chewing tobacco use 01/17/2016  . Atrial fibrillation with RVR ([place]) 12/23/2015  . Dyspnea on exertion 12/23/2015  . Elevated troponin 12/23/2015  . GERD (gastroesophageal reflux disease) 12/23/2015  . Iron deficiency anemia due to chronic blood loss 12/20/2015  . Femoral neck fracture, left, closed, initial encounter 11/03/2015  . Chronic atrial fibrillation ([place])   . Coronary artery disease involving native coronary artery of native heart without angina pectoris   . Acalculous cholecystitis 10/29/2015  . Adenopathy   . CAD (coronary artery disease) 12/22/2014  . HTN (hypertension) 12/22/2014   Pura Spice, PT, DPT # [id number] Janna Arch, SPT 07/26/2016, 4:32 PM  [facility] [address] [place], [place], [address] Phone: [phone]   Fax:  [phone]  Name: MARRELL DICAPRIO MRN: [id number] Date of Birth: Sep 20, 1930

## 2016-07-27 ENCOUNTER — Encounter: Payer: Self-pay | Admitting: *Deleted

## 2016-07-30 NOTE — Discharge Instructions (Signed)
Cataract Surgery, Care After °Refer to this sheet in the next few weeks. These instructions provide you with information about caring for yourself after your procedure. Your health care provider may also give you more specific instructions. Your treatment has been planned according to current medical practices, but problems sometimes occur. Call your health care provider if you have any problems or questions after your procedure. °What can I expect after the procedure? °After the procedure, it is common to have: °· Itching. °· Discomfort. °· Fluid discharge. °· Sensitivity to light and to touch. °· Bruising. °Follow these instructions at home: °Eye Care  °· Check your eye every day for signs of infection. Watch for: °¨ Redness, swelling, or pain. °¨ Fluid, blood, or pus. °¨ Warmth. °¨ Bad smell. °Activity  °· Avoid strenuous activities, such as playing contact sports, for as long as told by your health care provider. °· Do not drive or operate heavy machinery until your health care provider approves. °· Do not bend or lift heavy objects . Bending increases pressure in the eye. You can walk, climb stairs, and do light household chores. °· Ask your health care provider when you can return to work. If you work in a dusty environment, you may be advised to wear protective eyewear for a period of time. °General instructions  °· Take or apply over-the-counter and prescription medicines only as told by your health care provider. This includes eye drops. °· Do not touch or rub your eyes. °· If you were given a protective shield, wear it as told by your health care provider. If you were not given a protective shield, wear sunglasses as told by your health care provider to protect your eyes. °· Keep the area around your eye clean and dry. Avoid swimming or allowing water to hit you directly in the face while showering until told by your health care provider. Keep soap and shampoo out of your eyes. °· Do not put a contact lens  into the affected eye or eyes until your health care provider approves. °· Keep all follow-up visits as told by your health care provider. This is important. °Contact a health care provider if: ° °· You have increased bruising around your eye. °· You have pain that is not helped with medicine. °· You have a fever. °· You have redness, swelling, or pain in your eye. °· You have fluid, blood, or pus coming from your incision. °· Your vision gets worse. °Get help right away if: °· You have sudden vision loss. °This information is not intended to replace advice given to you by your health care provider. Make sure you discuss any questions you have with your health care provider. °Document Released: 11/17/2004 Document Revised: 09/08/2015 Document Reviewed: 03/10/2015 °Elsevier Interactive Patient Education © 2017 [employer] ° ° ° ° °General Anesthesia, Adult, Care After °These instructions provide you with information about caring for yourself after your procedure. Your health care provider may also give you more specific instructions. Your treatment has been planned according to current medical practices, but problems sometimes occur. Call your health care provider if you have any problems or questions after your procedure. °What can I expect after the procedure? °After the procedure, it is common to have: °· Vomiting. °· A sore throat. °· Mental slowness. °It is common to feel: °· Nauseous. °· Cold or shivery. °· Sleepy. °· Tired. °· Sore or achy, even in parts of your body where you did not have surgery. °Follow these instructions at   home: °For at least 24 hours after the procedure:  °· Do not: °¨ Participate in activities where you could fall or become injured. °¨ Drive. °¨ Use heavy machinery. °¨ Drink alcohol. °¨ Take sleeping pills or medicines that cause drowsiness. °¨ Make important decisions or sign legal documents. °¨ Take care of children on your own. °· Rest. °Eating and drinking  °· If you vomit, drink  water, juice, or soup when you can drink without vomiting. °· Drink enough fluid to keep your urine clear or pale yellow. °· Make sure you have little or no nausea before eating solid foods. °· Follow the diet recommended by your health care provider. °General instructions  °· Have a responsible adult stay with you until you are awake and alert. °· Return to your normal activities as told by your health care provider. Ask your health care provider what activities are safe for you. °· Take over-the-counter and prescription medicines only as told by your health care provider. °· If you smoke, do not smoke without supervision. °· Keep all follow-up visits as told by your health care provider. This is important. °Contact a health care provider if: °· You continue to have nausea or vomiting at home, and medicines are not helpful. °· You cannot drink fluids or start eating again. °· You cannot urinate after 8-12 hours. °· You develop a skin rash. °· You have fever. °· You have increasing redness at the site of your procedure. °Get help right away if: °· You have difficulty breathing. °· You have chest pain. °· You have unexpected bleeding. °· You feel that you are having a life-threatening or urgent problem. °This information is not intended to replace advice given to you by your health care provider. Make sure you discuss any questions you have with your health care provider. °Document Released: 08/06/2000 Document Revised: 10/03/2015 Document Reviewed: 04/14/2015 °Elsevier Interactive Patient Education © 2017 [employer] Inc[employer] ° °

## 2016-08-01 ENCOUNTER — Encounter
Admission: RE | Disposition: A | Discharge status: Home / Self Care | Payer: Self-pay | Source: Ambulatory Visit | Attending: Ophthalmology

## 2016-08-01 ENCOUNTER — Ambulatory Visit: Payer: PPO | Admitting: Anesthesiology

## 2016-08-01 ENCOUNTER — Ambulatory Visit
Admission: RE | Admit: 2016-08-01 | Discharge: 2016-08-01 | Disposition: A | Discharge status: Home / Self Care | Payer: PPO | Source: Ambulatory Visit | Attending: Ophthalmology | Admitting: Ophthalmology

## 2016-08-01 DIAGNOSIS — F419 Anxiety disorder, unspecified: Secondary | ICD-10-CM | POA: Insufficient documentation

## 2016-08-01 DIAGNOSIS — I110 Hypertensive heart disease with heart failure: Secondary | ICD-10-CM | POA: Insufficient documentation

## 2016-08-01 DIAGNOSIS — H2512 Age-related nuclear cataract, left eye: Secondary | ICD-10-CM | POA: Diagnosis not present

## 2016-08-01 DIAGNOSIS — Z87891 Personal history of nicotine dependence: Secondary | ICD-10-CM | POA: Insufficient documentation

## 2016-08-01 DIAGNOSIS — I4891 Unspecified atrial fibrillation: Secondary | ICD-10-CM | POA: Insufficient documentation

## 2016-08-01 DIAGNOSIS — R591 Generalized enlarged lymph nodes: Secondary | ICD-10-CM | POA: Diagnosis not present

## 2016-08-01 DIAGNOSIS — K219 Gastro-esophageal reflux disease without esophagitis: Secondary | ICD-10-CM | POA: Insufficient documentation

## 2016-08-01 DIAGNOSIS — I509 Heart failure, unspecified: Secondary | ICD-10-CM | POA: Insufficient documentation

## 2016-08-01 DIAGNOSIS — I252 Old myocardial infarction: Secondary | ICD-10-CM | POA: Insufficient documentation

## 2016-08-01 DIAGNOSIS — I2510 Atherosclerotic heart disease of native coronary artery without angina pectoris: Secondary | ICD-10-CM | POA: Diagnosis not present

## 2016-08-01 DIAGNOSIS — Z79899 Other long term (current) drug therapy: Secondary | ICD-10-CM | POA: Diagnosis not present

## 2016-08-01 HISTORY — PX: CATARACT EXTRACTION W/PHACO: SHX586

## 2016-08-01 SURGERY — PHACOEMULSIFICATION, CATARACT, WITH IOL INSERTION
Anesthesia: Monitor Anesthesia Care | Laterality: Left | Wound class: Clean

## 2016-08-01 MED ORDER — LIDOCAINE HCL (PF) 2 % IJ SOLN
INTRAMUSCULAR | Status: DC | PRN
  Administered 2016-08-01: 1 mL via INTRAOCULAR

## 2016-08-01 MED ORDER — MOXIFLOXACIN HCL 0.5 % OP SOLN
1.0000 [drp] | OPHTHALMIC | Status: DC | PRN
  Administered 2016-08-01 (×3): 1 [drp] via OPHTHALMIC

## 2016-08-01 MED ORDER — FENTANYL CITRATE (PF) 100 MCG/2ML IJ SOLN
INTRAMUSCULAR | Status: DC | PRN
  Administered 2016-08-01: 50 ug via INTRAVENOUS

## 2016-08-01 MED ORDER — ARMC OPHTHALMIC DILATING DROPS
1.0000 "application " | OPHTHALMIC | Status: DC | PRN
  Administered 2016-08-01 (×3): 1 via OPHTHALMIC

## 2016-08-01 MED ORDER — CEFUROXIME OPHTHALMIC INJECTION 1 MG/0.1 ML
INJECTION | OPHTHALMIC | Status: DC | PRN
  Administered 2016-08-01: 0.1 mL via OPHTHALMIC

## 2016-08-01 MED ORDER — BRIMONIDINE TARTRATE-TIMOLOL 0.2-0.5 % OP SOLN
OPHTHALMIC | Status: DC | PRN
  Administered 2016-08-01: 1 [drp] via OPHTHALMIC

## 2016-08-01 MED ORDER — ERYTHROMYCIN 5 MG/GM OP OINT
TOPICAL_OINTMENT | OPHTHALMIC | Status: DC | PRN
  Administered 2016-08-01: 1 via OPHTHALMIC

## 2016-08-01 MED ORDER — NA HYALUR & NA CHOND-NA HYALUR 0.4-0.35 ML IO KIT
PACK | INTRAOCULAR | Status: DC | PRN
  Administered 2016-08-01: 1 mL via INTRAOCULAR

## 2016-08-01 MED ORDER — EPINEPHRINE PF 1 MG/ML IJ SOLN
INTRAOCULAR | Status: DC | PRN
  Administered 2016-08-01: 79 mL via OPHTHALMIC

## 2016-08-01 SURGICAL SUPPLY — 25 items
CANNULA ANT/CHMB 27GA (MISCELLANEOUS) ×3 IMPLANT
CARTRIDGE ABBOTT (MISCELLANEOUS) IMPLANT
GLOVE SURG LX 7.5 STRW (GLOVE) ×2
GLOVE SURG LX STRL 7.5 STRW (GLOVE) ×1 IMPLANT
GLOVE SURG TRIUMPH 8.0 PF LTX (GLOVE) ×3 IMPLANT
GOWN STRL REUS W/ TWL LRG LVL3 (GOWN DISPOSABLE) ×2 IMPLANT
GOWN STRL REUS W/TWL LRG LVL3 (GOWN DISPOSABLE) ×4
LENS IOL ACRYSOF IQ 22.0 (Intraocular Lens) ×3 IMPLANT
MARKER SKIN DUAL TIP RULER LAB (MISCELLANEOUS) ×3 IMPLANT
NDL RETROBULBAR .5 NSTRL (NEEDLE) IMPLANT
NEEDLE FILTER BLUNT 18X 1/2SAF (NEEDLE) ×2
NEEDLE FILTER BLUNT 18X1 1/2 (NEEDLE) ×1 IMPLANT
PACK CATARACT BRASINGTON (MISCELLANEOUS) ×3 IMPLANT
PACK EYE AFTER SURG (MISCELLANEOUS) ×3 IMPLANT
PACK OPTHALMIC (MISCELLANEOUS) ×3 IMPLANT
RING MALYGIN 7.0 (MISCELLANEOUS) IMPLANT
SUT ETHILON 10-0 CS-B-6CS-B-6 (SUTURE)
SUT VICRYL  9 0 (SUTURE)
SUT VICRYL 9 0 (SUTURE) IMPLANT
SUTURE EHLN 10-0 CS-B-6CS-B-6 (SUTURE) IMPLANT
SYR 3ML LL SCALE MARK (SYRINGE) ×3 IMPLANT
SYR 5ML LL (SYRINGE) ×3 IMPLANT
SYR TB 1ML LUER SLIP (SYRINGE) ×3 IMPLANT
WATER STERILE IRR 250ML POUR (IV SOLUTION) ×3 IMPLANT
WIPE NON LINTING 3.25X3.25 (MISCELLANEOUS) ×3 IMPLANT

## 2016-08-01 NOTE — Anesthesia Preprocedure Evaluation (Addendum)
Anesthesia Evaluation  Patient identified by MRN, date of birth, ID band Patient awake    Reviewed: Allergy & Precautions, NPO status , Patient's Chart, lab work & pertinent test results  Airway Mallampati: III  TM Distance: >3 FB Neck ROM: Full    Dental  (+) Poor Dentition, Missing   Pulmonary former smoker,  Hilar lymphadenopathy   Pulmonary exam normal        Cardiovascular hypertension, + CAD, + Past MI and +CHF  + dysrhythmias Atrial Fibrillation  Rhythm:Irregular Rate:Normal  TTE 12/2015: Left ventricle: Systolic function was severely reduced. The   estimated ejection fraction was in the range of 20% to 25%.   Akinesis of the anteroseptal myocardium. Akinesis of the apical   myocardium. ; unchanged from the previous study.   Neuro/Psych Anxiety    GI/Hepatic GERD  Medicated and Controlled,  Endo/Other    Renal/GU CRFRenal disease     Musculoskeletal   Abdominal   Peds  Hematology   Anesthesia Other Findings   Reproductive/Obstetrics                            Anesthesia Physical Anesthesia Plan  ASA: III  Anesthesia Plan: MAC   Post-op Pain Management:    Induction: Intravenous  Airway Management Planned:   Additional Equipment:   Intra-op Plan:   Post-operative Plan:   Informed Consent: I have reviewed the patients History and Physical, chart, labs and discussed the procedure including the risks, benefits and alternatives for the proposed anesthesia with the patient or authorized representative who has indicated his/her understanding and acceptance.     Plan Discussed with: CRNA  Anesthesia Plan Comments:         Anesthesia Quick Evaluation

## 2016-08-01 NOTE — Op Note (Signed)
Andres Gutierrez [id number] 08/01/2016   PREOPERATIVE DIAGNOSIS:  Nuclear sclerotic cataract left eye. H25.12   POSTOPERATIVE DIAGNOSIS:    Nuclear sclerotic cataract left eye.     PROCEDURE:  Phacoemusification with posterior chamber intraocular lens placement of the left eye   LENS:   Implant Name Type Inv. Item Serial No. Manufacturer Lot No. LRB No. Used  LENS IOL ACRYSOF IQ 22.0 - [id number] Intraocular Lens LENS IOL ACRYSOF IQ 22.0 [id number] [employer]CON   Left 1        ULTRASOUND TIME: 21  % of 1 minutes 30 seconds, CDE 19.2  SURGEON:  Wyonia Hough, MD   ANESTHESIA:  Topical with tetracaine drops and 2% Xylocaine jelly, augmented with 1% preservative-free intracameral lidocaine.    COMPLICATIONS:  None.   DESCRIPTION OF PROCEDURE:  The patient was identified in the holding room and transported to the operating room and placed in the supine position under the operating microscope.  The left eye was identified as [name] and it was prepped and draped in the usual sterile ophthalmic fashion.   A 1 millimeter clear-corneal paracentesis was made at the 1:30 position.  0.5 ml of preservative-free 1% lidocaine was injected into the anterior chamber.  The anterior chamber was filled with Viscoat viscoelastic.  A 2.4 millimeter keratome was used to make a near-clear corneal incision at the 10:30 position.  .  A curvilinear capsulorrhexis was made with a cystotome and capsulorrhexis forceps.  Balanced salt solution was used to hydrodissect and hydrodelineate the nucleus.   Phacoemulsification was then used in stop and chop fashion to remove the lens nucleus and epinucleus.  The remaining cortex was then removed using the irrigation and aspiration handpiece. Provisc was then placed into the capsular bag to distend it for lens placement.  A lens was then injected into the capsular bag.  The remaining viscoelastic was aspirated.   Wounds were hydrated with  balanced salt solution.  The anterior chamber was inflated to a physiologic pressure with balanced salt solution.  No wound leaks were noted. Cefuroxime 0.1 ml of a 10mg /ml solution was injected into the anterior chamber for a dose of 1 mg of intracameral antibiotic at the completion of the case.   Timolol and Brimonidine drops were applied to the eye.  The patient was taken to the recovery room in stable condition without complications of anesthesia or surgery.  [name] 08/01/2016, 11:42 AM

## 2016-08-01 NOTE — Anesthesia Postprocedure Evaluation (Signed)
Anesthesia Post Note  Patient: Andres Gutierrez  Procedure(s) Performed: Procedure(s) (LRB): CATARACT EXTRACTION PHACO AND INTRAOCULAR LENS PLACEMENT (IOC) Left (Left)  Patient location during evaluation: PACU Anesthesia Type: MAC Level of consciousness: awake and alert and oriented Pain management: pain level controlled Vital Signs Assessment: post-procedure vital signs reviewed and stable Respiratory status: spontaneous breathing and nonlabored ventilation Cardiovascular status: stable Postop Assessment: no signs of nausea or vomiting and adequate PO intake Anesthetic complications: no    Estill Batten

## 2016-08-01 NOTE — Transfer of Care (Signed)
Immediate Anesthesia Transfer of Care Note  Patient: Andres Gutierrez  Procedure(s) Performed: Procedure(s): CATARACT EXTRACTION PHACO AND INTRAOCULAR LENS PLACEMENT (IOC) Left (Left)  Patient Location: PACU  Anesthesia Type: MAC  Level of Consciousness: awake, alert  and patient cooperative  Airway and Oxygen Therapy: Patient Spontanous Breathing and Patient connected to supplemental oxygen  Post-op Assessment: Post-op Vital signs reviewed, Patient's Cardiovascular Status Stable, Respiratory Function Stable, Patent Airway and No signs of Nausea or vomiting  Post-op Vital Signs: Reviewed and stable  Complications: No apparent anesthesia complications

## 2016-08-01 NOTE — H&P (Signed)
The History and Physical notes are on paper, have been signed, and are to be scanned. The patient remains stable and unchanged from the H&P.   Previous H&P reviewed, patient examined, and there are no changes.  Nechelle Petrizzo 08/01/2016 10:47 AM

## 2016-08-01 NOTE — Anesthesia Procedure Notes (Signed)
Procedure Name: MAC Performed by: Lind Guest Pre-anesthesia Checklist: Patient identified, Emergency Drugs available, Suction available, Patient being monitored and Timeout performed Patient Re-evaluated:Patient Re-evaluated prior to inductionOxygen Delivery Method: Nasal cannula

## 2016-08-13 DIAGNOSIS — J40 Bronchitis, not specified as acute or chronic: Secondary | ICD-10-CM | POA: Diagnosis not present

## 2016-08-18 DIAGNOSIS — I4891 Unspecified atrial fibrillation: Secondary | ICD-10-CM | POA: Diagnosis not present

## 2016-08-18 DIAGNOSIS — E860 Dehydration: Secondary | ICD-10-CM | POA: Diagnosis not present

## 2016-08-18 DIAGNOSIS — Z87891 Personal history of nicotine dependence: Secondary | ICD-10-CM | POA: Diagnosis not present

## 2016-08-18 DIAGNOSIS — Z79899 Other long term (current) drug therapy: Secondary | ICD-10-CM | POA: Diagnosis not present

## 2016-08-18 DIAGNOSIS — N400 Enlarged prostate without lower urinary tract symptoms: Secondary | ICD-10-CM | POA: Diagnosis not present

## 2016-08-18 DIAGNOSIS — I110 Hypertensive heart disease with heart failure: Secondary | ICD-10-CM | POA: Diagnosis not present

## 2016-08-18 DIAGNOSIS — Z7982 Long term (current) use of aspirin: Secondary | ICD-10-CM | POA: Diagnosis not present

## 2016-08-18 DIAGNOSIS — K219 Gastro-esophageal reflux disease without esophagitis: Secondary | ICD-10-CM | POA: Diagnosis not present

## 2016-08-18 DIAGNOSIS — R112 Nausea with vomiting, unspecified: Secondary | ICD-10-CM | POA: Diagnosis not present

## 2016-08-18 DIAGNOSIS — R1903 Right lower quadrant abdominal swelling, mass and lump: Secondary | ICD-10-CM | POA: Diagnosis not present

## 2016-08-18 DIAGNOSIS — K529 Noninfective gastroenteritis and colitis, unspecified: Secondary | ICD-10-CM | POA: Diagnosis not present

## 2016-08-18 DIAGNOSIS — N200 Calculus of kidney: Secondary | ICD-10-CM | POA: Diagnosis not present

## 2016-08-18 DIAGNOSIS — R001 Bradycardia, unspecified: Secondary | ICD-10-CM | POA: Diagnosis not present

## 2016-08-18 DIAGNOSIS — R1013 Epigastric pain: Secondary | ICD-10-CM | POA: Diagnosis not present

## 2016-08-18 DIAGNOSIS — Z951 Presence of aortocoronary bypass graft: Secondary | ICD-10-CM | POA: Diagnosis not present

## 2016-08-18 DIAGNOSIS — I5022 Chronic systolic (congestive) heart failure: Secondary | ICD-10-CM | POA: Diagnosis not present

## 2016-08-18 DIAGNOSIS — R197 Diarrhea, unspecified: Secondary | ICD-10-CM | POA: Diagnosis not present

## 2016-08-18 DIAGNOSIS — I2510 Atherosclerotic heart disease of native coronary artery without angina pectoris: Secondary | ICD-10-CM | POA: Diagnosis not present

## 2016-08-19 DIAGNOSIS — R197 Diarrhea, unspecified: Secondary | ICD-10-CM | POA: Diagnosis not present

## 2016-08-19 DIAGNOSIS — R112 Nausea with vomiting, unspecified: Secondary | ICD-10-CM | POA: Diagnosis not present

## 2016-08-24 DIAGNOSIS — E538 Deficiency of other specified B group vitamins: Secondary | ICD-10-CM | POA: Diagnosis not present

## 2016-08-24 DIAGNOSIS — M25562 Pain in left knee: Secondary | ICD-10-CM | POA: Diagnosis not present

## 2016-08-24 DIAGNOSIS — E860 Dehydration: Secondary | ICD-10-CM | POA: Diagnosis not present

## 2016-08-24 DIAGNOSIS — D500 Iron deficiency anemia secondary to blood loss (chronic): Secondary | ICD-10-CM | POA: Diagnosis not present

## 2016-08-24 DIAGNOSIS — M25561 Pain in right knee: Secondary | ICD-10-CM | POA: Diagnosis not present

## 2016-08-24 DIAGNOSIS — N183 Chronic kidney disease, stage 3 (moderate): Secondary | ICD-10-CM | POA: Diagnosis not present

## 2016-08-29 DIAGNOSIS — I252 Old myocardial infarction: Secondary | ICD-10-CM | POA: Diagnosis not present

## 2016-08-29 DIAGNOSIS — S32111A Minimally displaced Zone I fracture of sacrum, initial encounter for closed fracture: Secondary | ICD-10-CM | POA: Diagnosis not present

## 2016-08-29 DIAGNOSIS — Z79899 Other long term (current) drug therapy: Secondary | ICD-10-CM | POA: Diagnosis not present

## 2016-08-29 DIAGNOSIS — S3210XA Unspecified fracture of sacrum, initial encounter for closed fracture: Secondary | ICD-10-CM | POA: Diagnosis not present

## 2016-08-29 DIAGNOSIS — M4316 Spondylolisthesis, lumbar region: Secondary | ICD-10-CM | POA: Diagnosis not present

## 2016-08-29 DIAGNOSIS — K5900 Constipation, unspecified: Secondary | ICD-10-CM | POA: Diagnosis not present

## 2016-08-29 DIAGNOSIS — M545 Low back pain: Secondary | ICD-10-CM | POA: Diagnosis not present

## 2016-08-29 DIAGNOSIS — R918 Other nonspecific abnormal finding of lung field: Secondary | ICD-10-CM | POA: Diagnosis not present

## 2016-08-29 DIAGNOSIS — F17290 Nicotine dependence, other tobacco product, uncomplicated: Secondary | ICD-10-CM | POA: Diagnosis not present

## 2016-08-29 DIAGNOSIS — Z888 Allergy status to other drugs, medicaments and biological substances status: Secondary | ICD-10-CM | POA: Diagnosis not present

## 2016-08-29 DIAGNOSIS — I4891 Unspecified atrial fibrillation: Secondary | ICD-10-CM | POA: Diagnosis not present

## 2016-08-29 DIAGNOSIS — Z951 Presence of aortocoronary bypass graft: Secondary | ICD-10-CM | POA: Diagnosis not present

## 2016-08-29 DIAGNOSIS — S322XXA Fracture of coccyx, initial encounter for closed fracture: Secondary | ICD-10-CM | POA: Diagnosis not present

## 2016-08-29 DIAGNOSIS — R9431 Abnormal electrocardiogram [ECG] [EKG]: Secondary | ICD-10-CM | POA: Diagnosis not present

## 2016-08-29 DIAGNOSIS — R410 Disorientation, unspecified: Secondary | ICD-10-CM | POA: Diagnosis not present

## 2016-08-29 DIAGNOSIS — I440 Atrioventricular block, first degree: Secondary | ICD-10-CM | POA: Diagnosis not present

## 2016-08-29 DIAGNOSIS — I110 Hypertensive heart disease with heart failure: Secondary | ICD-10-CM | POA: Diagnosis not present

## 2016-08-29 DIAGNOSIS — M549 Dorsalgia, unspecified: Secondary | ICD-10-CM | POA: Diagnosis not present

## 2016-08-29 DIAGNOSIS — I509 Heart failure, unspecified: Secondary | ICD-10-CM | POA: Diagnosis not present

## 2016-08-29 DIAGNOSIS — W08XXXA Fall from other furniture, initial encounter: Secondary | ICD-10-CM | POA: Diagnosis not present

## 2016-08-29 DIAGNOSIS — Z7982 Long term (current) use of aspirin: Secondary | ICD-10-CM | POA: Diagnosis not present

## 2016-08-29 DIAGNOSIS — M47816 Spondylosis without myelopathy or radiculopathy, lumbar region: Secondary | ICD-10-CM | POA: Diagnosis not present

## 2016-08-29 DIAGNOSIS — I2510 Atherosclerotic heart disease of native coronary artery without angina pectoris: Secondary | ICD-10-CM | POA: Diagnosis not present

## 2016-08-29 DIAGNOSIS — I517 Cardiomegaly: Secondary | ICD-10-CM | POA: Diagnosis not present

## 2016-08-30 DIAGNOSIS — E538 Deficiency of other specified B group vitamins: Secondary | ICD-10-CM | POA: Diagnosis not present

## 2016-08-30 DIAGNOSIS — Z125 Encounter for screening for malignant neoplasm of prostate: Secondary | ICD-10-CM | POA: Diagnosis not present

## 2016-08-30 DIAGNOSIS — E782 Mixed hyperlipidemia: Secondary | ICD-10-CM | POA: Diagnosis not present

## 2016-08-30 DIAGNOSIS — S322XXA Fracture of coccyx, initial encounter for closed fracture: Secondary | ICD-10-CM | POA: Diagnosis not present

## 2016-08-30 DIAGNOSIS — N183 Chronic kidney disease, stage 3 (moderate): Secondary | ICD-10-CM | POA: Diagnosis not present

## 2016-08-30 DIAGNOSIS — Z79899 Other long term (current) drug therapy: Secondary | ICD-10-CM | POA: Diagnosis not present

## 2016-09-05 ENCOUNTER — Ambulatory Visit: Payer: PPO | Admitting: Family

## 2016-09-11 DIAGNOSIS — R7989 Other specified abnormal findings of blood chemistry: Secondary | ICD-10-CM | POA: Diagnosis not present

## 2016-09-20 ENCOUNTER — Encounter: Payer: Self-pay | Admitting: Family

## 2016-09-20 ENCOUNTER — Ambulatory Visit: Payer: PPO | Attending: Family | Admitting: Family

## 2016-09-20 VITALS — BP 138/68 | HR 74 | Resp 20 | Ht 76.0 in | Wt 200.2 lb

## 2016-09-20 DIAGNOSIS — I252 Old myocardial infarction: Secondary | ICD-10-CM | POA: Diagnosis not present

## 2016-09-20 DIAGNOSIS — I5022 Chronic systolic (congestive) heart failure: Secondary | ICD-10-CM | POA: Diagnosis not present

## 2016-09-20 DIAGNOSIS — Z87891 Personal history of nicotine dependence: Secondary | ICD-10-CM | POA: Insufficient documentation

## 2016-09-20 DIAGNOSIS — F419 Anxiety disorder, unspecified: Secondary | ICD-10-CM | POA: Insufficient documentation

## 2016-09-20 DIAGNOSIS — I2510 Atherosclerotic heart disease of native coronary artery without angina pectoris: Secondary | ICD-10-CM | POA: Insufficient documentation

## 2016-09-20 DIAGNOSIS — N189 Chronic kidney disease, unspecified: Secondary | ICD-10-CM | POA: Insufficient documentation

## 2016-09-20 DIAGNOSIS — I130 Hypertensive heart and chronic kidney disease with heart failure and stage 1 through stage 4 chronic kidney disease, or unspecified chronic kidney disease: Secondary | ICD-10-CM | POA: Insufficient documentation

## 2016-09-20 DIAGNOSIS — I10 Essential (primary) hypertension: Secondary | ICD-10-CM

## 2016-09-20 DIAGNOSIS — I482 Chronic atrial fibrillation, unspecified: Secondary | ICD-10-CM

## 2016-09-20 NOTE — Progress Notes (Signed)
Patient ID: Andres Gutierrez, male    DOB: 04-Nov-1930, 83 y.o.   MRN: [id number]  HPI  Andres Gutierrez is a 83 y/o male with a history of MI, HTN, CAD, CKD, atrial fibrillation, anxiety, anemia, remote tobacco use and chronic heart failure.  Last echo was done 12/24/15 and showed an EF of 20-25% with trivial AR and mild Andres. Unchanged from previous echo done June 2017. Cardiac cath was done 12/28/15 Conclusion:   Successful cardiac catheter patent grafts with lesion to the insertion of the ramus   Recommend functional study prior to consideration for intervention   Aggressive medical therapy in the meanwhile.   Case discussed primary cardiology Dr. Ubaldo Glassing and interventionalists at [facility] Dr. Jerelene Redden as well as the patient and family  Same day cataract surgery on 08/01/16. Was admitted to [facility] on 01/24/16 with anemia resulting in 1 unit of PRBC's given. Was cathed showing patent grafts and 75% lesion to the insertion of the ramus. Was discharged to [facility] and is now back at home receiving physical therapy at home.  Patient presents today with a chief complaint of a follow-up visit. He denies any fatigue, shortness of breath, edema or weight gain. He continues to increase his activity and has been walking some without his walker when he's home.   Past Medical History:  Diagnosis Date  . Anemia   . Anxiety   . Atrial fibrillation ([place])   . Cardiomyopathy ([place])   . CHF (congestive heart failure) ([place])   . Chronic kidney disease    kidney stones  . Coronary artery disease   . Cough   . Dysrhythmia   . Hypertension   . Lymphadenopathy, hilar   . MI (myocardial infarction)    x 2  . Wheezing    Past Surgical History:  Procedure Laterality Date  . BRONCHIAL NEEDLE ASPIRATION BIOPSY N/A 12/31/2014   Procedure: BRONCHIAL NEEDLE ASPIRATION BIOPSIES from carina;  Surgeon: Flora Lipps, MD;  Location: [facility] ORS;  Service: Cardiopulmonary;  Laterality: N/A;  . CARDIAC CATHETERIZATION N/A 12/28/2015   Procedure:  Left Heart Cath and Cors/Grafts Angiography;  Surgeon: Yolonda Kida, MD;  Location: [place] CV LAB;  Service: Cardiovascular;  Laterality: N/A;  . CATARACT EXTRACTION W/PHACO Left 08/01/2016   Procedure: CATARACT EXTRACTION PHACO AND INTRAOCULAR LENS PLACEMENT ([place]) Left;  Surgeon: Leandrew Koyanagi, MD;  Location: [place];  Service: Ophthalmology;  Laterality: Left;  . CORONARY ANGIOPLASTY WITH STENT PLACEMENT    . CORONARY ARTERY BYPASS GRAFT    . ENDOBRONCHIAL ULTRASOUND N/A 12/31/2014   Procedure: ENDOBRONCHIAL ULTRASOUND;  Surgeon: Flora Lipps, MD;  Location: [facility] ORS;  Service: Cardiopulmonary;  Laterality: N/A;  . ESOPHAGOGASTRODUODENOSCOPY (EGD) WITH PROPOFOL N/A 06/20/2015   Procedure: ESOPHAGOGASTRODUODENOSCOPY (EGD) WITH PROPOFOL;  Surgeon: Lollie Sails, MD;  Location: [facility] ENDOSCOPY;  Service: Endoscopy;  Laterality: N/A;   Family History  Problem Relation Age of Onset  . CAD Mother   . Colon cancer Father    Social History  Substance Use Topics  . Smoking status: Former Smoker    Quit date: 06/27/1977  . Smokeless tobacco: Current User    Types: Chew  . Alcohol use Yes     Comment: occational almost rare once a year   Allergies  Allergen Reactions  . 5ht3 Receptor Antagonists   . Diphenhydramine Hcl Other (See Comments)    Reaction:  Rash and fever a long time ago, but has had it since with no problem.  . Escitalopram Other (  See Comments)    Reaction:  Makes him feel faint, like he was going to have a heart attack.  . Maxidex [Dexamethasone] Other (See Comments)    Reaction:  Unknown   . Prednisone Other (See Comments)    Pt states that this medication made him feel crazy.    . Serotonin Other (See Comments)    Tried 2 different types, lexapro and another one.  Reaction:  Made him feel like he was having a heart attack.   . Vytorin [Ezetimibe-Simvastatin] Other (See Comments)    Reaction:  Unknown   . Zocor [Simvastatin] Other (See  Comments)    Reaction:  Unknown      Review of Systems  Constitutional: Negative for appetite change and fatigue.  HENT: Positive for hearing loss. Negative for congestion, postnasal drip and sore throat.   Eyes: Negative for pain and redness.  Respiratory: Negative for cough, chest tightness and shortness of breath.   Cardiovascular: Negative for chest pain, palpitations and leg swelling.  Gastrointestinal: Negative for abdominal distention and abdominal pain.  Endocrine: Negative.   Genitourinary: Negative.   Musculoskeletal: Negative for back pain and neck pain.  Skin: Negative.   Allergic/Immunologic: Negative.   Neurological: Negative for dizziness and light-headedness.  Hematological: Negative for adenopathy. Does not bruise/bleed easily.  Psychiatric/Behavioral: Negative for dysphoric mood, sleep disturbance (sleeping on 1 pillow) and suicidal ideas. The patient is not nervous/anxious.    Vitals:   09/20/16 1023  BP: 138/68  Pulse: 74  Resp: 20  SpO2: 100%  Weight: 200 lb 4 oz (90.8 kg)  Height: 6\' 4"  (1.93 m)   Wt Readings from Last 3 Encounters:  09/20/16 200 lb 4 oz (90.8 kg)  08/01/16 206 lb (93.4 kg)  06/26/16 204 lb (92.5 kg)    Lab Results  Component Value Date   CREATININE 1.24 06/19/2016   CREATININE 1.09 01/26/2016   CREATININE 1.22 01/25/2016    Physical Exam  Constitutional: He is oriented to person, place, and time. He appears well-developed and well-nourished.  HENT:  Head: Normocephalic and atraumatic.  Right Ear: Decreased hearing is noted.  Left Ear: Decreased hearing is noted.  Neck: Normal range of motion. Neck supple. No JVD present.  Cardiovascular: Normal rate and regular rhythm.   Pulmonary/Chest: Effort normal. He has no wheezes. He has no rales.  Abdominal: Soft. He exhibits no distension. There is no tenderness.  Musculoskeletal: He exhibits edema (trace edema in right lower leg). He exhibits no tenderness.  Neurological: He is  alert and oriented to person, place, and time.  Skin: Skin is warm and dry.  Psychiatric: He has a normal mood and affect. His behavior is normal. Thought content normal.  Nursing note and vitals reviewed.    Assessment & Plan:  1: Chronic heart failure with reduced ejection fraction- - NYHA Class I - euvolemic - continue weighing daily and take furosemide for an overnight weight gain of >2 pounds/weekly weight gain of >5 pounds. Weight down 8.6 pounds since he was last here - Elevate legs when sitting for long periods.  - continues to be very active at home and walks without his walker on occasion - currently class I so no indication for entresto - saw cardiologist (Fath) 06/05/16  2: HTN- - BP looks good - Taking furosemide as needed - saw PCP Sabra Heck) 08/30/16 - BMP from 09/11/16 reviewed; Potassium 4.5 & GFR 48  3: Chronic atrial fibrillation- - Not on eliquis due to history of GI bleed with  recent admission for this September 2017. - No chest pain - Continues on metoprolol and baby aspirin   Patient did not bring his medications nor a list. Each medication was verbally reviewed with the patient and he was encouraged to bring the bottles to every visit to confirm accuracy of list.  Return in 6 months or sooner for any questions/problems before then.

## 2016-09-20 NOTE — Patient Instructions (Signed)
Continue weighing daily and call for an overnight weight gain of > 2 pounds or a weekly weight gain of >5 pounds. 

## 2016-10-22 DIAGNOSIS — M21371 Foot drop, right foot: Secondary | ICD-10-CM | POA: Diagnosis not present

## 2016-10-22 DIAGNOSIS — L97511 Non-pressure chronic ulcer of other part of right foot limited to breakdown of skin: Secondary | ICD-10-CM | POA: Diagnosis not present

## 2016-10-22 DIAGNOSIS — G609 Hereditary and idiopathic neuropathy, unspecified: Secondary | ICD-10-CM | POA: Diagnosis not present

## 2016-10-22 DIAGNOSIS — L97521 Non-pressure chronic ulcer of other part of left foot limited to breakdown of skin: Secondary | ICD-10-CM | POA: Diagnosis not present

## 2016-11-19 DIAGNOSIS — E782 Mixed hyperlipidemia: Secondary | ICD-10-CM | POA: Diagnosis not present

## 2016-11-19 DIAGNOSIS — Z79899 Other long term (current) drug therapy: Secondary | ICD-10-CM | POA: Diagnosis not present

## 2016-11-19 DIAGNOSIS — E538 Deficiency of other specified B group vitamins: Secondary | ICD-10-CM | POA: Diagnosis not present

## 2016-11-19 DIAGNOSIS — G609 Hereditary and idiopathic neuropathy, unspecified: Secondary | ICD-10-CM | POA: Diagnosis not present

## 2016-11-19 DIAGNOSIS — M2042 Other hammer toe(s) (acquired), left foot: Secondary | ICD-10-CM | POA: Diagnosis not present

## 2016-11-19 DIAGNOSIS — Z125 Encounter for screening for malignant neoplasm of prostate: Secondary | ICD-10-CM | POA: Diagnosis not present

## 2016-11-19 DIAGNOSIS — L97521 Non-pressure chronic ulcer of other part of left foot limited to breakdown of skin: Secondary | ICD-10-CM | POA: Diagnosis not present

## 2016-11-27 DIAGNOSIS — D500 Iron deficiency anemia secondary to blood loss (chronic): Secondary | ICD-10-CM | POA: Diagnosis not present

## 2016-11-27 DIAGNOSIS — E538 Deficiency of other specified B group vitamins: Secondary | ICD-10-CM | POA: Diagnosis not present

## 2016-11-27 DIAGNOSIS — I5022 Chronic systolic (congestive) heart failure: Secondary | ICD-10-CM | POA: Diagnosis not present

## 2016-11-27 DIAGNOSIS — Z0000 Encounter for general adult medical examination without abnormal findings: Secondary | ICD-10-CM | POA: Insufficient documentation

## 2016-11-27 DIAGNOSIS — I482 Chronic atrial fibrillation: Secondary | ICD-10-CM | POA: Diagnosis not present

## 2016-12-17 DIAGNOSIS — G609 Hereditary and idiopathic neuropathy, unspecified: Secondary | ICD-10-CM | POA: Diagnosis not present

## 2016-12-17 DIAGNOSIS — L97521 Non-pressure chronic ulcer of other part of left foot limited to breakdown of skin: Secondary | ICD-10-CM | POA: Diagnosis not present

## 2016-12-21 ENCOUNTER — Inpatient Hospital Stay: Payer: PPO | Attending: Internal Medicine

## 2016-12-21 DIAGNOSIS — Z87442 Personal history of urinary calculi: Secondary | ICD-10-CM | POA: Diagnosis not present

## 2016-12-21 DIAGNOSIS — D696 Thrombocytopenia, unspecified: Secondary | ICD-10-CM | POA: Insufficient documentation

## 2016-12-21 DIAGNOSIS — I129 Hypertensive chronic kidney disease with stage 1 through stage 4 chronic kidney disease, or unspecified chronic kidney disease: Secondary | ICD-10-CM | POA: Diagnosis not present

## 2016-12-21 DIAGNOSIS — Z7982 Long term (current) use of aspirin: Secondary | ICD-10-CM | POA: Insufficient documentation

## 2016-12-21 DIAGNOSIS — F419 Anxiety disorder, unspecified: Secondary | ICD-10-CM | POA: Diagnosis not present

## 2016-12-21 DIAGNOSIS — I4891 Unspecified atrial fibrillation: Secondary | ICD-10-CM | POA: Diagnosis not present

## 2016-12-21 DIAGNOSIS — I252 Old myocardial infarction: Secondary | ICD-10-CM | POA: Insufficient documentation

## 2016-12-21 DIAGNOSIS — I509 Heart failure, unspecified: Secondary | ICD-10-CM | POA: Insufficient documentation

## 2016-12-21 DIAGNOSIS — I10 Essential (primary) hypertension: Secondary | ICD-10-CM | POA: Insufficient documentation

## 2016-12-21 DIAGNOSIS — Z79899 Other long term (current) drug therapy: Secondary | ICD-10-CM | POA: Diagnosis not present

## 2016-12-21 DIAGNOSIS — Z87891 Personal history of nicotine dependence: Secondary | ICD-10-CM | POA: Insufficient documentation

## 2016-12-21 DIAGNOSIS — I2510 Atherosclerotic heart disease of native coronary artery without angina pectoris: Secondary | ICD-10-CM | POA: Insufficient documentation

## 2016-12-21 DIAGNOSIS — K5900 Constipation, unspecified: Secondary | ICD-10-CM | POA: Insufficient documentation

## 2016-12-21 DIAGNOSIS — D500 Iron deficiency anemia secondary to blood loss (chronic): Secondary | ICD-10-CM | POA: Diagnosis not present

## 2016-12-21 DIAGNOSIS — Z800 Family history of malignant neoplasm of digestive organs: Secondary | ICD-10-CM | POA: Insufficient documentation

## 2016-12-21 DIAGNOSIS — N189 Chronic kidney disease, unspecified: Secondary | ICD-10-CM | POA: Diagnosis not present

## 2016-12-21 LAB — CBC WITH DIFFERENTIAL/PLATELET
BASOS ABS: 0.1 10*3/uL (ref 0–0.1)
Basophils Relative: 1 %
EOS PCT: 2 %
Eosinophils Absolute: 0.1 10*3/uL (ref 0–0.7)
HCT: 34.6 % — ABNORMAL LOW (ref 40.0–52.0)
Hemoglobin: 11.7 g/dL — ABNORMAL LOW (ref 13.0–18.0)
LYMPHS PCT: 45 %
Lymphs Abs: 2.5 10*3/uL (ref 1.0–3.6)
MCH: 33.3 pg (ref 26.0–34.0)
MCHC: 33.8 g/dL (ref 32.0–36.0)
MCV: 98.6 fL (ref 80.0–100.0)
MONO ABS: 1 10*3/uL (ref 0.2–1.0)
Monocytes Relative: 19 %
Neutro Abs: 1.8 10*3/uL (ref 1.4–6.5)
Neutrophils Relative %: 33 %
PLATELETS: 142 10*3/uL — AB (ref 150–440)
RBC: 3.51 MIL/uL — ABNORMAL LOW (ref 4.40–5.90)
RDW: 15.6 % — ABNORMAL HIGH (ref 11.5–14.5)
WBC: 5.5 10*3/uL (ref 3.8–10.6)

## 2016-12-21 LAB — BASIC METABOLIC PANEL
ANION GAP: 11 (ref 5–15)
BUN: 24 mg/dL — AB (ref 6–20)
CALCIUM: 9.6 mg/dL (ref 8.9–10.3)
CO2: 23 mmol/L (ref 22–32)
CREATININE: 1.55 mg/dL — AB (ref 0.61–1.24)
Chloride: 104 mmol/L (ref 101–111)
GFR calc Af Amer: 46 mL/min — ABNORMAL LOW (ref >60)
GFR, EST NON AFRICAN AMERICAN: 40 mL/min — AB (ref >60)
GLUCOSE: 112 mg/dL — AB (ref 65–99)
Potassium: 4.3 mmol/L (ref 3.5–5.1)
Sodium: 138 mmol/L (ref 135–145)

## 2016-12-21 LAB — IRON AND TIBC
Iron: 64 ug/dL (ref 45–182)
Saturation Ratios: 19 % (ref 17.9–39.5)
TIBC: 339 ug/dL (ref 250–450)
UIBC: 275 ug/dL

## 2016-12-21 LAB — FERRITIN: Ferritin: 60 ng/mL (ref 24–336)

## 2016-12-25 ENCOUNTER — Other Ambulatory Visit: Payer: Self-pay | Admitting: *Deleted

## 2016-12-25 ENCOUNTER — Inpatient Hospital Stay (HOSPITAL_BASED_OUTPATIENT_CLINIC_OR_DEPARTMENT_OTHER): Payer: PPO | Admitting: Internal Medicine

## 2016-12-25 ENCOUNTER — Inpatient Hospital Stay: Payer: PPO

## 2016-12-25 VITALS — BP 148/70 | HR 76 | Temp 97.6°F | Resp 20 | Ht 76.0 in | Wt 202.8 lb

## 2016-12-25 DIAGNOSIS — D696 Thrombocytopenia, unspecified: Secondary | ICD-10-CM

## 2016-12-25 DIAGNOSIS — I10 Essential (primary) hypertension: Secondary | ICD-10-CM | POA: Diagnosis not present

## 2016-12-25 DIAGNOSIS — I252 Old myocardial infarction: Secondary | ICD-10-CM | POA: Diagnosis not present

## 2016-12-25 DIAGNOSIS — I509 Heart failure, unspecified: Secondary | ICD-10-CM

## 2016-12-25 DIAGNOSIS — I129 Hypertensive chronic kidney disease with stage 1 through stage 4 chronic kidney disease, or unspecified chronic kidney disease: Secondary | ICD-10-CM

## 2016-12-25 DIAGNOSIS — N189 Chronic kidney disease, unspecified: Secondary | ICD-10-CM

## 2016-12-25 DIAGNOSIS — I2510 Atherosclerotic heart disease of native coronary artery without angina pectoris: Secondary | ICD-10-CM | POA: Diagnosis not present

## 2016-12-25 DIAGNOSIS — Z87442 Personal history of urinary calculi: Secondary | ICD-10-CM

## 2016-12-25 DIAGNOSIS — Z800 Family history of malignant neoplasm of digestive organs: Secondary | ICD-10-CM

## 2016-12-25 DIAGNOSIS — D500 Iron deficiency anemia secondary to blood loss (chronic): Secondary | ICD-10-CM

## 2016-12-25 DIAGNOSIS — K5900 Constipation, unspecified: Secondary | ICD-10-CM | POA: Diagnosis not present

## 2016-12-25 DIAGNOSIS — F419 Anxiety disorder, unspecified: Secondary | ICD-10-CM | POA: Diagnosis not present

## 2016-12-25 DIAGNOSIS — Z87891 Personal history of nicotine dependence: Secondary | ICD-10-CM

## 2016-12-25 DIAGNOSIS — I4891 Unspecified atrial fibrillation: Secondary | ICD-10-CM

## 2016-12-25 DIAGNOSIS — Z79899 Other long term (current) drug therapy: Secondary | ICD-10-CM

## 2016-12-25 DIAGNOSIS — Z7982 Long term (current) use of aspirin: Secondary | ICD-10-CM

## 2016-12-25 NOTE — Progress Notes (Signed)
[place] NOTE  Patient Care Team: Rusty Aus, MD as PCP - General (Internal Medicine) Alisa Graff, FNP as Nurse Practitioner (Family Medicine) Ubaldo Glassing Javier Docker, MD as Consulting Physician (Cardiology) Erby Pian, MD as Referring Physician (Specialist)  CHIEF COMPLAINTS/PURPOSE OF CONSULTATION:   # 2012- Anemia 11-12- sec to CKD/IDA? MDS [no BMBx] [March 2017-sat-11%; ferritin-42] [Feb 2017- EGD/colo-July 2015 Dr.Skulskie];April 2017-PO Iron PO.   # Intermittent thrombocytopenia 120s-140s [Dec 2016-CT- neg cirrhosis/splenomegaly]  # CKD [creatinine 1.2- 1.4]; CAD/CHF  HISTORY OF PRESENTING ILLNESS:  Andres Gutierrez 83 y.o.  male history of above history of chronic anemia and intermittent thrombocytopenia is here for follow-up.  Patient has not had any recent admissions the hospital. He denies any unusual shortness of breath. He continues to deny any blood in stools. He is taking iron pills every other day. Once a day on causes constipation.  ROS: A complete 10 point review of system is done which is negative except mentioned above in history of present illness  MEDICAL HISTORY:  Past Medical History:  Diagnosis Date  . Anemia   . Anxiety   . Atrial fibrillation ([place])   . Cardiomyopathy ([place])   . CHF (congestive heart failure) ([place])   . Chronic kidney disease    kidney stones  . Coronary artery disease   . Cough   . Dysrhythmia   . Hypertension   . Lymphadenopathy, hilar   . MI (myocardial infarction) ([place])    x 2  . Wheezing     SURGICAL HISTORY: Past Surgical History:  Procedure Laterality Date  . BRONCHIAL NEEDLE ASPIRATION BIOPSY N/A 12/31/2014   Procedure: BRONCHIAL NEEDLE ASPIRATION BIOPSIES from carina;  Surgeon: Flora Lipps, MD;  Location: [facility] ORS;  Service: Cardiopulmonary;  Laterality: N/A;  . CARDIAC CATHETERIZATION N/A 12/28/2015   Procedure: Left Heart Cath and Cors/Grafts Angiography;  Surgeon: Yolonda Kida, MD;   Location: [place] CV LAB;  Service: Cardiovascular;  Laterality: N/A;  . CATARACT EXTRACTION W/PHACO Left 08/01/2016   Procedure: CATARACT EXTRACTION PHACO AND INTRAOCULAR LENS PLACEMENT ([place]) Left;  Surgeon: Leandrew Koyanagi, MD;  Location: [place];  Service: Ophthalmology;  Laterality: Left;  . CORONARY ANGIOPLASTY WITH STENT PLACEMENT    . CORONARY ARTERY BYPASS GRAFT    . ENDOBRONCHIAL ULTRASOUND N/A 12/31/2014   Procedure: ENDOBRONCHIAL ULTRASOUND;  Surgeon: Flora Lipps, MD;  Location: [facility] ORS;  Service: Cardiopulmonary;  Laterality: N/A;  . ESOPHAGOGASTRODUODENOSCOPY (EGD) WITH PROPOFOL N/A 06/20/2015   Procedure: ESOPHAGOGASTRODUODENOSCOPY (EGD) WITH PROPOFOL;  Surgeon: Lollie Sails, MD;  Location: [facility] ENDOSCOPY;  Service: Endoscopy;  Laterality: N/A;    SOCIAL HISTORY: lives in [place]; alone. 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  Social History   Social History  . Marital status: Widowed    Spouse name: N/A  . Number of children: N/A  . Years of education: N/A   Occupational History  . Not on file.   Social History Main Topics  . Smoking status: Former Smoker    Quit date: 06/27/1977  . Smokeless tobacco: Current User    Types: Chew  . Alcohol use Yes     Comment: occational almost rare once a year  . Drug use: No  . Sexual activity: Not on file   Other Topics Concern  . Not on file   Social History Narrative  . No narrative on file    FAMILY HISTORY: Family History  Problem Relation Age of Onset  . CAD Mother   .  Colon cancer Father     ALLERGIES:  is allergic to 5ht3 receptor antagonists; diphenhydramine hcl; escitalopram; maxidex [dexamethasone]; prednisone; serotonin; vytorin [ezetimibe-simvastatin]; and zocor [simvastatin].  MEDICATIONS:  Current Outpatient Prescriptions  Medication Sig Dispense Refill  . acetaminophen (TYLENOL) 500 MG tablet Take 500 mg by mouth every 6 (six) hours as needed for mild pain or moderate pain.    Marland Kitchen aspirin EC  81 MG tablet Take 81 mg by mouth daily.    Marland Kitchen atorvastatin (LIPITOR) 40 MG tablet Take 1 tablet (40 mg total) by mouth daily at 6 PM. 30 tablet 0  . digoxin (LANOXIN) 0.125 MG tablet Take 0.0625 mg by mouth daily.    Marland Kitchen docusate sodium (COLACE) 100 MG capsule Take 100 mg by mouth 2 (two) times daily.    . ferrous sulfate 325 (65 FE) MG tablet Take 1 tablet (325 mg total) by mouth daily. (Patient taking differently: Take 325 mg by mouth daily with breakfast. ) 30 tablet 3  . furosemide (LASIX) 20 MG tablet Take 20 mg by mouth as needed. For weight gain greater than 2 pounds in 24 hours or greater than 5 pounds in one week    . metoprolol tartrate (LOPRESSOR) 12.5 mg TABS tablet Take 12.5 mg by mouth 2 (two) times daily.    . pantoprazole (PROTONIX) 40 MG tablet Take 40 mg by mouth 2 (two) times daily before a meal.     . ranolazine (RANEXA) 500 MG 12 hr tablet Take 1 tablet (500 mg total) by mouth 2 (two) times daily. 60 tablet 0  . sucralfate (CARAFATE) 1 G tablet Take 1 g by mouth 2 (two) times daily.     . tamsulosin (FLOMAX) 0.4 MG CAPS capsule Take 0.4 mg by mouth daily after supper.     . vitamin C (VITAMIN C) 250 MG tablet Take 1 tablet (250 mg total) by mouth daily. 30 tablet 0  . nitroGLYCERIN (NITROSTAT) 0.4 MG SL tablet Place 0.4 mg under the tongue every 5 (five) minutes as needed for chest pain.     No current facility-administered medications for this visit.       Marland Kitchen  PHYSICAL EXAMINATION: ECOG PERFORMANCE STATUS: 0 - Asymptomatic  Vitals:   12/25/16 0845  BP: (!) 148/70  Pulse: 76  Resp: 20  Temp: 97.6 F (36.4 C)   Filed Weights   12/25/16 0904  Weight: 202 lb 13.2 oz (92 kg)    GENERAL: Well-nourished well-developed; Alert, no distress and comfortable.   Accompanied by family.He is walking with a rolling walker. HEENT-withi is an outpatientn normal limits. Oral mucosa moist no oral ulcerations. No thrush. Chest decreased breath sounds at bases. No visible  crackles. Heart regular and no murmurs. 1+ bilateral lower extremity swelling  Abdomen soft nontender nondistended. No hepatomegaly. Bowel sounds present. Neurologic no deficits.   LABORATORY DATA:  I have reviewed the data as listed Lab Results  Component Value Date   WBC 5.5 12/21/2016   HGB 11.7 (L) 12/21/2016   HCT 34.6 (L) 12/21/2016   MCV 98.6 12/21/2016   PLT 142 (L) 12/21/2016    Recent Labs  01/02/16 0538  01/26/16 0640 06/19/16 0822 12/21/16 0828  NA 130*  < > 134* 135 138  K 4.4  < > 4.0 4.7 4.3  CL 97*  < > 100* 102 104  CO2 24  < > 26 25 23   GLUCOSE 123*  < > 93 99 112*  BUN 29*  < > 11 23* 24*  CREATININE 1.11  < > 1.09 1.24 1.55*  CALCIUM 8.3*  < > 8.8* 9.3 9.6  GFRNONAA 60*  < > >60 52* 40*  GFRAA >60  < > >60 >60 46*  PROT 7.3  --   --  7.9  --   ALBUMIN 3.3*  --   --  4.6  --   AST 18  --   --  20  --   ALT 13*  --   --  14*  --   ALKPHOS 85  --   --  59  --   BILITOT 1.8*  --   --  1.1  --   < > = values in this interval not displayed. ASSESSMENT & PLAN:   Iron deficiency anemia due to chronic blood loss # Anemia mild hemoglobin 11-12 [at least since 2012]. Likely secondary to CKD-III/iron deficiency [creatinine-1./ Ferritin 60 saturation 19%.] vs ? MDS [no Bmbx]. Today hemoglobin is 11.7; MCV 98 platelets 147. Continue by mouth iron every other day [secondary to GI intolerance]. Patient has not needed any IV iron so far.  # Long discussion the patient'/daughter regarding the possibility of underlying MDS. Also discussed the suspicion of possible low-grade process as this is very chronic. I do not suspect any acute/high-grade MDS. After a lengthy discussion- was decided to hold off a bone marrow biopsy at this time as patient is clinically stable. However at any point of time patients hemoglobin declines or his platelets decline- I would recommend a bone marrow biopsy for further evaluation.  # Intermittent thrombocytopenia- again unclear etiology.  Asymptomatic. CT scan 2017-  did not show any evidence of cirrhosis or splenomegaly. Asymptomatic/C discussion above. Today - platelets- 148..   # Patient will follow-up with me in approximately 6 months CBC iron studies and ferritin; possible IV venofer.   #The above plan of care was discussed with the patient and his daughter in detail.  # 25 minutes face-to-face with the patient discussing the above plan of care; more than 50% of time spent on prognosis/ natural history; counseling and coordination.      Cammie Sickle, MD 12/25/2016 9:05 PM

## 2016-12-25 NOTE — Assessment & Plan Note (Addendum)
#  Anemia mild hemoglobin 11-12 [at least since 2012]. Likely secondary to CKD-III/iron deficiency [creatinine-1./ Ferritin 60 saturation 19%.] vs ? MDS [no Bmbx]. Today hemoglobin is 11.7; MCV 98 platelets 147. Continue by mouth iron every other day [secondary to GI intolerance]. Patient has not needed any IV iron so far.  # Long discussion the patient'/daughter regarding the possibility of underlying MDS. Also discussed the suspicion of possible low-grade process as this is very chronic. I do not suspect any acute/high-grade MDS. After a lengthy discussion- was decided to hold off a bone marrow biopsy at this time as patient is clinically stable. However at any point of time patients hemoglobin declines or his platelets decline- I would recommend a bone marrow biopsy for further evaluation.  # Intermittent thrombocytopenia- again unclear etiology. Asymptomatic. CT scan 2017-  did not show any evidence of cirrhosis or splenomegaly. Asymptomatic/C discussion above. Today - platelets- 148..   # Patient will follow-up with me in approximately 6 months CBC iron studies and ferritin; possible IV venofer.   #The above plan of care was discussed with the patient and his daughter in detail.  # 25 minutes face-to-face with the patient discussing the above plan of care; more than 50% of time spent on prognosis/ natural history; counseling and coordination.

## 2016-12-25 NOTE — Progress Notes (Signed)
Patient here for IDA follow-up.

## 2016-12-27 DIAGNOSIS — I255 Ischemic cardiomyopathy: Secondary | ICD-10-CM | POA: Diagnosis not present

## 2016-12-27 DIAGNOSIS — I10 Essential (primary) hypertension: Secondary | ICD-10-CM | POA: Diagnosis not present

## 2016-12-27 DIAGNOSIS — I5022 Chronic systolic (congestive) heart failure: Secondary | ICD-10-CM | POA: Diagnosis not present

## 2016-12-27 DIAGNOSIS — I25810 Atherosclerosis of coronary artery bypass graft(s) without angina pectoris: Secondary | ICD-10-CM | POA: Diagnosis not present

## 2016-12-27 DIAGNOSIS — I482 Chronic atrial fibrillation: Secondary | ICD-10-CM | POA: Diagnosis not present

## 2016-12-27 DIAGNOSIS — E782 Mixed hyperlipidemia: Secondary | ICD-10-CM | POA: Diagnosis not present

## 2017-01-16 DIAGNOSIS — L97521 Non-pressure chronic ulcer of other part of left foot limited to breakdown of skin: Secondary | ICD-10-CM | POA: Diagnosis not present

## 2017-01-16 DIAGNOSIS — L97511 Non-pressure chronic ulcer of other part of right foot limited to breakdown of skin: Secondary | ICD-10-CM | POA: Diagnosis not present

## 2017-02-05 DIAGNOSIS — M25561 Pain in right knee: Secondary | ICD-10-CM | POA: Diagnosis not present

## 2017-02-05 DIAGNOSIS — M25562 Pain in left knee: Secondary | ICD-10-CM | POA: Diagnosis not present

## 2017-02-05 DIAGNOSIS — M170 Bilateral primary osteoarthritis of knee: Secondary | ICD-10-CM | POA: Diagnosis not present

## 2017-02-06 DIAGNOSIS — L97511 Non-pressure chronic ulcer of other part of right foot limited to breakdown of skin: Secondary | ICD-10-CM | POA: Diagnosis not present

## 2017-02-06 DIAGNOSIS — M2042 Other hammer toe(s) (acquired), left foot: Secondary | ICD-10-CM | POA: Diagnosis not present

## 2017-02-06 DIAGNOSIS — L97521 Non-pressure chronic ulcer of other part of left foot limited to breakdown of skin: Secondary | ICD-10-CM | POA: Diagnosis not present

## 2017-02-06 DIAGNOSIS — G609 Hereditary and idiopathic neuropathy, unspecified: Secondary | ICD-10-CM | POA: Diagnosis not present

## 2017-02-19 DIAGNOSIS — M170 Bilateral primary osteoarthritis of knee: Secondary | ICD-10-CM | POA: Diagnosis not present

## 2017-02-20 DIAGNOSIS — D500 Iron deficiency anemia secondary to blood loss (chronic): Secondary | ICD-10-CM | POA: Diagnosis not present

## 2017-02-20 DIAGNOSIS — I5022 Chronic systolic (congestive) heart failure: Secondary | ICD-10-CM | POA: Diagnosis not present

## 2017-02-25 ENCOUNTER — Encounter: Payer: Self-pay | Admitting: Emergency Medicine

## 2017-02-25 ENCOUNTER — Emergency Department: Payer: PPO

## 2017-02-25 ENCOUNTER — Emergency Department
Admission: EM | Admit: 2017-02-25 | Discharge: 2017-02-25 | Disposition: A | Discharge status: Home / Self Care | Payer: PPO | Attending: Emergency Medicine | Admitting: Emergency Medicine

## 2017-02-25 DIAGNOSIS — R109 Unspecified abdominal pain: Secondary | ICD-10-CM | POA: Diagnosis not present

## 2017-02-25 DIAGNOSIS — Z87891 Personal history of nicotine dependence: Secondary | ICD-10-CM | POA: Insufficient documentation

## 2017-02-25 DIAGNOSIS — R1013 Epigastric pain: Secondary | ICD-10-CM | POA: Diagnosis not present

## 2017-02-25 DIAGNOSIS — K4090 Unilateral inguinal hernia, without obstruction or gangrene, not specified as recurrent: Secondary | ICD-10-CM | POA: Diagnosis not present

## 2017-02-25 DIAGNOSIS — I2510 Atherosclerotic heart disease of native coronary artery without angina pectoris: Secondary | ICD-10-CM | POA: Insufficient documentation

## 2017-02-25 DIAGNOSIS — I5022 Chronic systolic (congestive) heart failure: Secondary | ICD-10-CM | POA: Insufficient documentation

## 2017-02-25 DIAGNOSIS — Z79899 Other long term (current) drug therapy: Secondary | ICD-10-CM | POA: Insufficient documentation

## 2017-02-25 DIAGNOSIS — Z7982 Long term (current) use of aspirin: Secondary | ICD-10-CM | POA: Diagnosis not present

## 2017-02-25 DIAGNOSIS — R1012 Left upper quadrant pain: Secondary | ICD-10-CM

## 2017-02-25 DIAGNOSIS — I252 Old myocardial infarction: Secondary | ICD-10-CM | POA: Diagnosis not present

## 2017-02-25 DIAGNOSIS — I130 Hypertensive heart and chronic kidney disease with heart failure and stage 1 through stage 4 chronic kidney disease, or unspecified chronic kidney disease: Secondary | ICD-10-CM | POA: Diagnosis not present

## 2017-02-25 DIAGNOSIS — R42 Dizziness and giddiness: Secondary | ICD-10-CM | POA: Diagnosis not present

## 2017-02-25 DIAGNOSIS — N189 Chronic kidney disease, unspecified: Secondary | ICD-10-CM | POA: Insufficient documentation

## 2017-02-25 LAB — URINALYSIS, COMPLETE (UACMP) WITH MICROSCOPIC
BILIRUBIN URINE: NEGATIVE
Bacteria, UA: NONE SEEN
Glucose, UA: NEGATIVE mg/dL
HGB URINE DIPSTICK: NEGATIVE
KETONES UR: NEGATIVE mg/dL
LEUKOCYTES UA: NEGATIVE
Nitrite: NEGATIVE
PH: 5 (ref 5.0–8.0)
Protein, ur: NEGATIVE mg/dL
SPECIFIC GRAVITY, URINE: 1.019 (ref 1.005–1.030)

## 2017-02-25 LAB — COMPREHENSIVE METABOLIC PANEL
ALT: 17 U/L (ref 17–63)
ANION GAP: 15 (ref 5–15)
AST: 30 U/L (ref 15–41)
Albumin: 4.7 g/dL (ref 3.5–5.0)
Alkaline Phosphatase: 63 U/L (ref 38–126)
BUN: 22 mg/dL — ABNORMAL HIGH (ref 6–20)
CALCIUM: 9.8 mg/dL (ref 8.9–10.3)
CHLORIDE: 101 mmol/L (ref 101–111)
CO2: 23 mmol/L (ref 22–32)
CREATININE: 1.69 mg/dL — AB (ref 0.61–1.24)
GFR, EST AFRICAN AMERICAN: 41 mL/min — AB (ref >60)
GFR, EST NON AFRICAN AMERICAN: 36 mL/min — AB (ref >60)
Glucose, Bld: 112 mg/dL — ABNORMAL HIGH (ref 65–99)
Potassium: 4.4 mmol/L (ref 3.5–5.1)
SODIUM: 139 mmol/L (ref 135–145)
Total Bilirubin: 1.1 mg/dL (ref 0.3–1.2)
Total Protein: 8.1 g/dL (ref 6.5–8.1)

## 2017-02-25 LAB — CBC
HCT: 34.1 % — ABNORMAL LOW (ref 40.0–52.0)
HEMOGLOBIN: 11.7 g/dL — AB (ref 13.0–18.0)
MCH: 34.7 pg — ABNORMAL HIGH (ref 26.0–34.0)
MCHC: 34.3 g/dL (ref 32.0–36.0)
MCV: 101.2 fL — AB (ref 80.0–100.0)
PLATELETS: 146 10*3/uL — AB (ref 150–440)
RBC: 3.37 MIL/uL — AB (ref 4.40–5.90)
RDW: 15.8 % — ABNORMAL HIGH (ref 11.5–14.5)
WBC: 4.7 10*3/uL (ref 3.8–10.6)

## 2017-02-25 LAB — LIPASE, BLOOD: LIPASE: 43 U/L (ref 11–51)

## 2017-02-25 LAB — TROPONIN I
Troponin I: 0.03 ng/mL (ref <0.03)
Troponin I: 0.03 ng/mL (ref <0.03)

## 2017-02-25 MED ORDER — SODIUM CHLORIDE 0.9 % IV BOLUS (SEPSIS)
500.0000 mL | Freq: Once | INTRAVENOUS | Status: AC
  Administered 2017-02-25: 500 mL via INTRAVENOUS

## 2017-02-25 MED ORDER — IOPAMIDOL (ISOVUE-300) INJECTION 61%
75.0000 mL | Freq: Once | INTRAVENOUS | Status: AC | PRN
  Administered 2017-02-25: 75 mL via INTRAVENOUS

## 2017-02-25 MED ORDER — FAMOTIDINE 20 MG PO TABS: 20.0000 mg | ORAL_TABLET | Freq: Two times a day (BID) | ORAL | 0 refills | Status: DC

## 2017-02-25 MED ORDER — IOPAMIDOL (ISOVUE-300) INJECTION 61%
30.0000 mL | Freq: Once | INTRAVENOUS | Status: AC | PRN
  Administered 2017-02-25: 30 mL via ORAL

## 2017-02-25 NOTE — ED Notes (Signed)
Pt aware of need for urine specimen. 

## 2017-02-25 NOTE — ED Provider Notes (Addendum)
[facility] Emergency Department Provider Note ____________________________________________   First MD Initiated Contact with Patient 02/25/17 [phone]     (approximate)  I have reviewed the triage vital signs and the nursing notes.   HISTORY  Chief Complaint Abdominal Pain    HPI Andres Gutierrez is a 83 y.o. male Past medical history as below who presents with epigastric and left upper quadrant abdominal pain, acute onset approximately 3 hours ago, associated with nausea and with lightheadedness. Patient states the pain was maximal at onset and is now somewhat improved. Patient denies any vomiting, chest pain, difficulty breathing, fever, urinary symptoms, or diarrhea.  states he had had similar pain in the past for which he has seen his primary care doctor but states that this time it was more severe.  Past Medical History:  Diagnosis Date  . Anemia   . Anxiety   . Atrial fibrillation ([place])   . Cardiomyopathy ([place])   . CHF (congestive heart failure) ([place])   . Chronic kidney disease    kidney stones  . Coronary artery disease   . Cough   . Dysrhythmia   . Hypertension   . Lymphadenopathy, hilar   . MI (myocardial infarction) ([place])    x 2  . Wheezing     Patient Active Problem List   Diagnosis Date Noted  . Chronic systolic heart failure ([place]) 01/17/2016  . Chewing tobacco use 01/17/2016  . Atrial fibrillation with RVR ([place]) 12/23/2015  . Dyspnea on exertion 12/23/2015  . Elevated troponin 12/23/2015  . GERD (gastroesophageal reflux disease) 12/23/2015  . Iron deficiency anemia due to chronic blood loss 12/20/2015  . Femoral neck fracture, left, closed, initial encounter 11/03/2015  . Chronic atrial fibrillation ([place])   . Coronary artery disease involving native coronary artery of native heart without angina pectoris   . Acalculous cholecystitis 10/29/2015  . Adenopathy   . CAD (coronary artery disease) 12/22/2014  . HTN (hypertension)  12/22/2014    Past Surgical History:  Procedure Laterality Date  . BRONCHIAL NEEDLE ASPIRATION BIOPSY N/A 12/31/2014   Procedure: BRONCHIAL NEEDLE ASPIRATION BIOPSIES from carina;  Surgeon: Flora Lipps, MD;  Location: [facility] ORS;  Service: Cardiopulmonary;  Laterality: N/A;  . CARDIAC CATHETERIZATION N/A 12/28/2015   Procedure: Left Heart Cath and Cors/Grafts Angiography;  Surgeon: Yolonda Kida, MD;  Location: [place] [facility];  Service: Cardiovascular;  Laterality: N/A;  . CATARACT EXTRACTION W/PHACO Left 08/01/2016   Procedure: CATARACT EXTRACTION PHACO AND INTRAOCULAR LENS PLACEMENT ([place]) Left;  Surgeon: Leandrew Koyanagi, MD;  Location: [place];  Service: Ophthalmology;  Laterality: Left;  . CORONARY ANGIOPLASTY WITH STENT PLACEMENT    . CORONARY ARTERY BYPASS GRAFT    . ENDOBRONCHIAL ULTRASOUND N/A 12/31/2014   Procedure: ENDOBRONCHIAL ULTRASOUND;  Surgeon: Flora Lipps, MD;  Location: [facility] ORS;  Service: Cardiopulmonary;  Laterality: N/A;  . ESOPHAGOGASTRODUODENOSCOPY (EGD) WITH PROPOFOL N/A 06/20/2015   Procedure: ESOPHAGOGASTRODUODENOSCOPY (EGD) WITH PROPOFOL;  Surgeon: Lollie Sails, MD;  Location: [facility] ENDOSCOPY;  Service: Endoscopy;  Laterality: N/A;    Prior to Admission medications   Medication Sig Start Date End Date Taking? Authorizing Provider  aspirin EC 81 MG tablet Take 81 mg by mouth daily.   Yes [provider]  atorvastatin (LIPITOR) 40 MG tablet Take 1 tablet (40 mg total) by mouth daily at 6 PM. 12/30/15  Yes Dustin Flock, MD  digoxin (LANOXIN) 0.125 MG tablet Take 0.0625 mg by mouth daily.   Yes [provider]  docusate sodium (COLACE) 100 MG capsule Take 100 mg by mouth 2 (two) times daily.   Yes [provider]  ferrous sulfate 325 (65 FE) MG tablet Take 1 tablet (325 mg total) by mouth daily. Patient taking differently: Take 325 mg by mouth daily with breakfast.  11/01/15  Yes Dustin Flock, MD  furosemide (LASIX)  20 MG tablet Take 20 mg by mouth as needed. For weight gain greater than 2 pounds in 24 hours or greater than 5 pounds in one week   Yes [provider]  metoprolol tartrate (LOPRESSOR) 12.5 mg TABS tablet Take 12.5 mg by mouth 2 (two) times daily.   Yes [provider]  pantoprazole (PROTONIX) 40 MG tablet Take 40 mg by mouth 2 (two) times daily before a meal.    Yes [provider]  ranolazine (RANEXA) 500 MG 12 hr tablet Take 1 tablet (500 mg total) by mouth 2 (two) times daily. 12/30/15  Yes Dustin Flock, MD  sucralfate (CARAFATE) 1 G tablet Take 1 g by mouth 2 (two) times daily.    Yes [provider]  tamsulosin (FLOMAX) 0.4 MG CAPS capsule Take 0.4 mg by mouth daily after supper.    Yes [provider]  vitamin C (VITAMIN C) 250 MG tablet Take 1 tablet (250 mg total) by mouth daily. 11/01/15  Yes Dustin Flock, MD  acetaminophen (TYLENOL) 500 MG tablet Take 500 mg by mouth every 6 (six) hours as needed for mild pain or moderate pain.    [provider]  nitroGLYCERIN (NITROSTAT) 0.4 MG SL tablet Place 0.4 mg under the tongue every 5 (five) minutes as needed for chest pain.    [provider]    Allergies 5ht3 receptor antagonists; Diphenhydramine hcl; Escitalopram; Maxidex [dexamethasone]; Prednisone; Serotonin; Vytorin [ezetimibe-simvastatin]; and Zocor [simvastatin]  Family History  Problem Relation Age of Onset  . CAD Mother   . Colon cancer Father     Social History Social History  Substance Use Topics  . Smoking status: Former Smoker    Quit date: 06/27/1977  . Smokeless tobacco: Current User    Types: Chew  . Alcohol use Yes     Comment: occational almost rare once a year    Review of Systems  Constitutional: No fever. Eyes: No redness. ENT: No neck pain.  Cardiovascular: Denies chest pain. Respiratory: Denies shortness of breath. Gastrointestinal: Positive for nausea.  Genitourinary: Negative for  dysuria.  Musculoskeletal: Negative for back pain. Skin: Negative for rash. Neurological: Negative for headache.   ____________________________________________   PHYSICAL EXAM:  VITAL SIGNS: ED Triage Vitals  Enc Vitals Group     BP 02/25/17 0907 136/66     Pulse Rate 02/25/17 0907 82     Resp 02/25/17 0907 18     Temp 02/25/17 0905 97.6 F (36.4 C)     Temp Source 02/25/17 0905 Oral     SpO2 02/25/17 0904 98 %     Weight 02/25/17 0905 200 lb (90.7 kg)     Height 02/25/17 0905 6\' 4"  (1.93 m)     Head Circumference --      Peak Flow --      Pain Score 02/25/17 0905 3     Pain Loc --      Pain Edu? --      Excl. in [place]? --     Constitutional: Alert and oriented. Well appearing and in no acute distress. Eyes: Conjunctivae are normal. No scleral icterus.  Head: Atraumatic. Nose:  No congestion/rhinnorhea. Mouth/Throat: Mucous membranes are somewhat dry.    Neck: Normal range of motion.  Cardiovascular: Normal rate, regular rhythm. Grossly normal heart sounds.  Good peripheral circulation. Respiratory: Normal respiratory effort.  No retractions. Lungs CTAB. Gastrointestinal: Soft with mild epigastric and moderate LUQ tenderness. No distention.  Genitourinary: No CVA tenderness. Musculoskeletal: No lower extremity edema.  Extremities warm and well perfused.  Neurologic:  Normal speech and language. No gross focal neurologic deficits are appreciated.  Skin:  Skin is warm and dry. No rash noted. Psychiatric: Mood and affect are normal. Speech and behavior are normal.  ____________________________________________   LABS (all labs ordered are listed, but only abnormal results are displayed)  Labs Reviewed  COMPREHENSIVE METABOLIC PANEL - Abnormal; Notable for the following:       Result Value   Glucose, Bld 112 (*)    BUN 22 (*)    Creatinine, Ser 1.69 (*)    GFR calc non Af Amer 36 (*)    GFR calc Af Amer 41 (*)    All other components within normal limits  CBC -  Abnormal; Notable for the following:    RBC 3.37 (*)    Hemoglobin 11.7 (*)    HCT 34.1 (*)    MCV 101.2 (*)    MCH 34.7 (*)    RDW 15.8 (*)    Platelets 146 (*)    All other components within normal limits  URINALYSIS, COMPLETE (UACMP) WITH MICROSCOPIC - Abnormal; Notable for the following:    Color, Urine YELLOW (*)    APPearance CLEAR (*)    Squamous Epithelial / LPF 0-5 (*)    All other components within normal limits  TROPONIN I - Abnormal; Notable for the following:    Troponin I 0.03 (*)    All other components within normal limits  TROPONIN I - Abnormal; Notable for the following:    Troponin I 0.03 (*)    All other components within normal limits  LIPASE, BLOOD   ____________________________________________  EKG  ED ECG REPORT I, Arta Silence, the attending physician, personally viewed and interpreted this ECG.  Date: 02/25/2017 EKG Time: 909 Rate: 70 Rhythm: normal sinus rhythm QRS Axis: normal Intervals: prolonged PR, nonspecific conduction delay ST/T Wave abnormalities: ST elevation in leads V2 through V4, T-wave inversions V5 and V6 Narrative Interpretation: nonspecific findings, no evidence of acute ischemia; EKG with no significant changes when compared to EKG of 12/26/15  ED ECG REPORT I, Arta Silence, the attending physician, personally viewed and interpreted this ECG.  Date: 02/25/2017 EKG Time: 912 Rate: 68 Rhythm: normal sinus rhythm QRS Axis: normal Intervals: prolonged PR, nonspecific conduction delay ST/T Wave abnormalities: ST elevation in leads V2 through V4, T-wave inversions V5 and V6 Narrative Interpretation: nonspecific findings; EKG with no significant changes when compared to EKG of 9:09 today  ____________________________________________  RADIOLOGY  CT abd: no acute findings, moderate stool burden  CXR: no acute infiltrate or opacity  ____________________________________________   PROCEDURES  Procedure(s)  performed: No    Critical Care performed: No ____________________________________________   INITIAL IMPRESSION / ASSESSMENT AND PLAN / ED COURSE  Pertinent labs & imaging results that were available during my care of the patient were reviewed by me and considered in my medical decision making (see chart for details).  83 year old male with past medical history as noted presents with epigastric and left upper quadrant abdominal pain this morning, associated with dizziness, and not associated with vomiting or fever. Patient denies associated chest  pain or shortness of breath. On exam, patient is relatively well-appearing and vital signs are normal. Abdomen is soft with epigastric and moderate left upper quadrant tenderness.  KG with nonspecific findings (computer read as acute MI) but it is unchanged from EKG of August 2017, and given no chest pain or other acute cardiac symptoms or is no evidence of cardiac etiology.  On review of patient's past records in [place], patient had prior workup for abdominal pain in 2017 with CT abdomen and right upper quadrant ultrasound which was suggestive gb wall thickening, but pt did not have cholecystectomy.  No other pertinent findings in past records.  Differential includes gastritis, PUD, diverticulitis, colitis, pancreatitis, less likely cholecystitis or hepatobiliary cause given the location of the pain. Do not suspect cardiac etiology. Plan: CT abdomen, chest x-ray, labs, UA, and reassess.    ----------------------------------------- 3:03 PM on 02/25/2017 -----------------------------------------  CT abdomen and chest x-ray are negative.  CBC values are consistent with patient's prior labs. Lab workup otherwise unremarkable except for slightly elevated Cr and indeterminate troponin.  Repeat troponin after 3 hours is unchanged, and is also consistent with prior values for a year ago. Patient has had no recurrence of his pain in the ED. He feels well and  would like to go home.  At this time, given no change in troponin value, pt's clinical picture and then otherwise negative workup, there is no evidence for ACS or other concerning acute cause; susp most likely gastritis vs constipation or other benign etiology, especially given pt's prior hx of similar pain.  Patient has f/u with his PMD Dr. Sabra Heck in 2 days. I attempted to reach Dr. Sabra Heck by phone to discuss the patient's presentation and discharge and followup plan, however I was placed on indefinite hold and was unable to get in touch with him.  Patient given thorough return precautions and will follow up as planned.  Safe for discharge home at this time.    ____________________________________________   FINAL CLINICAL IMPRESSION(S) / ED DIAGNOSES  Final diagnoses:  Left upper quadrant pain      NEW MEDICATIONS STARTED DURING THIS VISIT:  New Prescriptions   No medications on file     Note:  This document was prepared using Dragon voice recognition software and may include unintentional dictation errors.     Arta Silence, MD 02/25/17 1507    Arta Silence, MD 02/25/17 1531

## 2017-02-25 NOTE — ED Notes (Signed)
Date and time results received: 02/25/17 1004 (use smartphrase ".now" to insert current time)  Test: troponin Critical Value: 0.03  Name of Provider Notified: [email or web address]

## 2017-02-25 NOTE — ED Notes (Signed)
Patient transported to CT 

## 2017-02-25 NOTE — ED Notes (Signed)
Patient transported to X-ray 

## 2017-02-25 NOTE — Discharge Instructions (Signed)
Return to the ER for new or worsening abdominal pain, chest pain, difficulty breathing, lightheadedness or weakness, fevers, or any other new or worsening symptoms that concern you. Follow up with Dr. Sabra Heck this week as scheduled.

## 2017-02-25 NOTE — ED Notes (Signed)
Pt has not been able to obtain urine specimen yet. No needs. Family remains at bedside.

## 2017-02-25 NOTE — ED Triage Notes (Signed)
Pt c/o epigastric/LUQ pain. Started this morning. No vomiting/fevers.

## 2017-02-26 DIAGNOSIS — M170 Bilateral primary osteoarthritis of knee: Secondary | ICD-10-CM | POA: Diagnosis not present

## 2017-02-27 DIAGNOSIS — I5022 Chronic systolic (congestive) heart failure: Secondary | ICD-10-CM | POA: Diagnosis not present

## 2017-02-27 DIAGNOSIS — I482 Chronic atrial fibrillation: Secondary | ICD-10-CM | POA: Diagnosis not present

## 2017-02-27 DIAGNOSIS — R1084 Generalized abdominal pain: Secondary | ICD-10-CM | POA: Diagnosis not present

## 2017-02-27 DIAGNOSIS — Z23 Encounter for immunization: Secondary | ICD-10-CM | POA: Diagnosis not present

## 2017-03-04 DIAGNOSIS — L97521 Non-pressure chronic ulcer of other part of left foot limited to breakdown of skin: Secondary | ICD-10-CM | POA: Diagnosis not present

## 2017-03-04 DIAGNOSIS — G609 Hereditary and idiopathic neuropathy, unspecified: Secondary | ICD-10-CM | POA: Diagnosis not present

## 2017-03-04 DIAGNOSIS — L97511 Non-pressure chronic ulcer of other part of right foot limited to breakdown of skin: Secondary | ICD-10-CM | POA: Diagnosis not present

## 2017-03-05 DIAGNOSIS — M170 Bilateral primary osteoarthritis of knee: Secondary | ICD-10-CM | POA: Diagnosis not present

## 2017-03-10 DIAGNOSIS — M170 Bilateral primary osteoarthritis of knee: Secondary | ICD-10-CM | POA: Diagnosis not present

## 2017-03-11 DIAGNOSIS — D3132 Benign neoplasm of left choroid: Secondary | ICD-10-CM | POA: Diagnosis not present

## 2017-03-22 ENCOUNTER — Encounter: Payer: Self-pay | Admitting: Family

## 2017-03-22 ENCOUNTER — Ambulatory Visit: Payer: PPO | Attending: Family | Admitting: Family

## 2017-03-22 ENCOUNTER — Other Ambulatory Visit: Payer: Self-pay

## 2017-03-22 VITALS — BP 147/67 | HR 71 | Resp 18 | Ht 76.0 in | Wt 209.5 lb

## 2017-03-22 DIAGNOSIS — I5022 Chronic systolic (congestive) heart failure: Secondary | ICD-10-CM | POA: Insufficient documentation

## 2017-03-22 DIAGNOSIS — D649 Anemia, unspecified: Secondary | ICD-10-CM | POA: Diagnosis not present

## 2017-03-22 DIAGNOSIS — N189 Chronic kidney disease, unspecified: Secondary | ICD-10-CM | POA: Diagnosis not present

## 2017-03-22 DIAGNOSIS — I10 Essential (primary) hypertension: Secondary | ICD-10-CM

## 2017-03-22 DIAGNOSIS — R1012 Left upper quadrant pain: Secondary | ICD-10-CM | POA: Diagnosis not present

## 2017-03-22 DIAGNOSIS — F419 Anxiety disorder, unspecified: Secondary | ICD-10-CM | POA: Diagnosis not present

## 2017-03-22 DIAGNOSIS — I4891 Unspecified atrial fibrillation: Secondary | ICD-10-CM | POA: Insufficient documentation

## 2017-03-22 DIAGNOSIS — Z87891 Personal history of nicotine dependence: Secondary | ICD-10-CM | POA: Diagnosis not present

## 2017-03-22 DIAGNOSIS — I252 Old myocardial infarction: Secondary | ICD-10-CM | POA: Insufficient documentation

## 2017-03-22 DIAGNOSIS — I2510 Atherosclerotic heart disease of native coronary artery without angina pectoris: Secondary | ICD-10-CM | POA: Insufficient documentation

## 2017-03-22 DIAGNOSIS — I130 Hypertensive heart and chronic kidney disease with heart failure and stage 1 through stage 4 chronic kidney disease, or unspecified chronic kidney disease: Secondary | ICD-10-CM | POA: Insufficient documentation

## 2017-03-22 NOTE — Progress Notes (Signed)
Patient ID: Andres Gutierrez, male    DOB: 21-Mar-1931, 83 y.o.   MRN: [id number]  HPI  Andres Gutierrez is a 83 y/o male with a history of MI, HTN, CAD, CKD, atrial fibrillation, anxiety, anemia, remote tobacco use and chronic heart failure.  Last echo was done 12/24/15 and showed an EF of 20-25% with trivial AR and mild Andres. Unchanged from previous echo done June 2017. Cardiac cath was done 12/28/15 Conclusion:   Successful cardiac catheter patent grafts with lesion to the insertion of the ramus   Recommend functional study prior to consideration for intervention   Aggressive medical therapy in the meanwhile.   Case discussed primary cardiology Dr. Ubaldo Glassing and interventionalists at [facility] Dr. Jerelene Redden as well as the patient and family  Was in the ED 02/25/17 due to LUQ pain. Was treated and released.   Patient presents today with a chief complaint of a follow-up visit. Andres Gutierrez denies any fatigue, shortness of breath, edema, dizziness or difficulty sleeping. Has noticed a gradual weight gain but feels like Andres Gutierrez's been eating more. Denies any overnight weight gain of >2 pounds.   Past Medical History:  Diagnosis Date  . Anemia   . Anxiety   . Atrial fibrillation ([place])   . Cardiomyopathy ([place])   . CHF (congestive heart failure) ([place])   . Chronic kidney disease    kidney stones  . Coronary artery disease   . Cough   . Dysrhythmia   . Hypertension   . Lymphadenopathy, hilar   . MI (myocardial infarction) ([place])    x 2  . Wheezing    Past Surgical History:  Procedure Laterality Date  . CORONARY ANGIOPLASTY WITH STENT PLACEMENT    . CORONARY ARTERY BYPASS GRAFT     Family History  Problem Relation Age of Onset  . CAD Mother   . Colon cancer Father    Social History   Tobacco Use  . Smoking status: Former Smoker    Last attempt to quit: 06/27/1977    Years since quitting: 39.7  . Smokeless tobacco: Current User    Types: Chew  Substance Use Topics  . Alcohol use: Yes    Comment: occational almost  rare once a year   Allergies  Allergen Reactions  . 5ht3 Receptor Antagonists   . Diphenhydramine Hcl Other (See Comments)    Reaction:  Rash and fever a long time ago, but has had it since with no problem.  . Escitalopram Other (See Comments)    Reaction:  Makes him feel faint, like Andres Gutierrez was going to have a heart attack.  . Maxidex [Dexamethasone] Other (See Comments)    Reaction:  Unknown   . Prednisone Other (See Comments)    Pt states that this medication made him feel crazy.    . Serotonin Other (See Comments)    Tried 2 different types, lexapro and another one.  Reaction:  Made him feel like Andres Gutierrez was having a heart attack.   . Vytorin [Ezetimibe-Simvastatin] Other (See Comments)    Reaction:  Unknown   . Zocor [Simvastatin] Other (See Comments)    Reaction:  Unknown    Prior to Admission medications   Medication Sig Start Date End Date Taking? Authorizing Provider  aspirin EC 81 MG tablet Take 81 mg by mouth daily.   Yes [provider]  atorvastatin (LIPITOR) 40 MG tablet Take 1 tablet (40 mg total) by mouth daily at 6 PM. 12/30/15  Yes Dustin Flock, MD  digoxin Fonnie [place])  0.125 MG tablet Take 0.0625 mg by mouth daily.   Yes [provider]  famotidine (PEPCID) 20 MG tablet Take 1 tablet (20 mg total) by mouth 2 (two) times daily. 02/25/17 03/27/17 Yes Arta Silence, MD  ferrous sulfate 325 (65 FE) MG tablet Take 1 tablet (325 mg total) by mouth daily. Patient taking differently: Take 325 mg by mouth daily with breakfast.  11/01/15  Yes Dustin Flock, MD  metoprolol tartrate (LOPRESSOR) 12.5 mg TABS tablet Take 12.5 mg by mouth 2 (two) times daily.   Yes [provider]  pantoprazole (PROTONIX) 40 MG tablet Take 40 mg by mouth 2 (two) times daily before a meal.    Yes [provider]  ranolazine (RANEXA) 500 MG 12 hr tablet Take 1 tablet (500 mg total) by mouth 2 (two) times daily. 12/30/15  Yes Dustin Flock, MD  sucralfate (CARAFATE) 1  G tablet Take 1 g by mouth 2 (two) times daily.    Yes [provider]  tamsulosin (FLOMAX) 0.4 MG CAPS capsule Take 0.4 mg by mouth daily after supper.    Yes [provider]  vitamin C (VITAMIN C) 250 MG tablet Take 1 tablet (250 mg total) by mouth daily. 11/01/15  Yes Dustin Flock, MD  acetaminophen (TYLENOL) 500 MG tablet Take 500 mg by mouth every 6 (six) hours as needed for mild pain or moderate pain.    [provider]  docusate sodium (COLACE) 100 MG capsule Take 100 mg by mouth 2 (two) times daily.    [provider]  furosemide (LASIX) 20 MG tablet Take 20 mg by mouth as needed. For weight gain greater than 2 pounds in 24 hours or greater than 5 pounds in one week    [provider]  nitroGLYCERIN (NITROSTAT) 0.4 MG SL tablet Place 0.4 mg under the tongue every 5 (five) minutes as needed for chest pain.    [provider]   Review of Systems  Constitutional: Negative for appetite change and fatigue.  HENT: Positive for hearing loss. Negative for congestion, postnasal drip and sore throat.   Eyes: Negative for pain and redness.  Respiratory: Negative for cough, chest tightness and shortness of breath.   Cardiovascular: Negative for chest pain, palpitations and leg swelling.  Gastrointestinal: Negative for abdominal distention and abdominal pain.  Endocrine: Negative.   Genitourinary: Negative.   Musculoskeletal: Negative for back pain and neck pain.  Skin: Negative.   Allergic/Immunologic: Negative.   Neurological: Negative for dizziness and light-headedness.  Hematological: Negative for adenopathy. Does not bruise/bleed easily.  Psychiatric/Behavioral: Negative for dysphoric mood, sleep disturbance (sleeping on 1 pillow) and suicidal ideas. The patient is not nervous/anxious.    Vitals:   03/22/17 1049  BP: (!) 147/67  Pulse: 71  Resp: 18  SpO2: 100%  Weight: 209 lb 8 oz (95 kg)  Height: 6\' 4"  (1.93 m)   Wt Readings  from Last 3 Encounters:  03/22/17 209 lb 8 oz (95 kg)  02/25/17 200 lb (90.7 kg)  12/25/16 202 lb 13.2 oz (92 kg)    Lab Results  Component Value Date   CREATININE 1.69 (H) 02/25/2017   CREATININE 1.55 (H) 12/21/2016   CREATININE 1.24 06/19/2016    Physical Exam  Constitutional: Andres Gutierrez is oriented to person, place, and time. Andres Gutierrez appears well-developed and well-nourished.  HENT:  Head: Normocephalic and atraumatic.  Right Ear: Decreased hearing is noted.  Left Ear: Decreased hearing is noted.  Neck: Normal range of motion. Neck supple.  No JVD present.  Cardiovascular: Normal rate and regular rhythm.  Pulmonary/Chest: Effort normal. Andres Gutierrez has no wheezes. Andres Gutierrez has no rales.  Abdominal: Soft. Andres Gutierrez exhibits no distension. There is no tenderness.  Musculoskeletal: Andres Gutierrez exhibits no edema or tenderness.  Neurological: Andres Gutierrez is alert and oriented to person, place, and time.  Skin: Skin is warm and dry.  Psychiatric: Andres Gutierrez has a normal mood and affect. His behavior is normal. Thought content normal.  Nursing note and vitals reviewed.    Assessment & Plan:  1: Chronic heart failure with reduced ejection fraction- - NYHA Class I - euvolemic - continue weighing daily and take furosemide for an overnight weight gain of >2 pounds/weekly weight gain of >5 pounds.  - Elevate legs when sitting for long periods.  - continues to be very active at home and walks without his walker on occasion - currently class I so no indication for entresto - saw cardiologist (Fath) 12/27/16  2: HTN- - BP looks good - Taking furosemide as needed - saw PCP Sabra Heck) 02/27/17 - BMP from 02/20/17 reviewed; Potassium 4.5 & GFR 48  Patient did not bring his medications nor a list. Each medication was verbally reviewed with the patient and Andres Gutierrez was encouraged to bring the bottles to every visit to confirm accuracy of list.  Patient opts to not make a return appointment at this time. Advised him that Andres Gutierrez could call back at any point to  make an appointment.

## 2017-03-22 NOTE — Patient Instructions (Signed)
Continue weighing daily and call for an overnight weight gain of > 2 pounds or a weekly weight gain of >5 pounds. 

## 2017-04-01 DIAGNOSIS — L97511 Non-pressure chronic ulcer of other part of right foot limited to breakdown of skin: Secondary | ICD-10-CM | POA: Diagnosis not present

## 2017-04-01 DIAGNOSIS — L97521 Non-pressure chronic ulcer of other part of left foot limited to breakdown of skin: Secondary | ICD-10-CM | POA: Diagnosis not present

## 2017-04-01 DIAGNOSIS — G609 Hereditary and idiopathic neuropathy, unspecified: Secondary | ICD-10-CM | POA: Diagnosis not present

## 2017-04-29 DIAGNOSIS — G609 Hereditary and idiopathic neuropathy, unspecified: Secondary | ICD-10-CM | POA: Diagnosis not present

## 2017-04-29 DIAGNOSIS — L97511 Non-pressure chronic ulcer of other part of right foot limited to breakdown of skin: Secondary | ICD-10-CM | POA: Diagnosis not present

## 2017-04-29 DIAGNOSIS — L97522 Non-pressure chronic ulcer of other part of left foot with fat layer exposed: Secondary | ICD-10-CM | POA: Diagnosis not present

## 2017-04-29 DIAGNOSIS — B351 Tinea unguium: Secondary | ICD-10-CM | POA: Diagnosis not present

## 2017-05-15 DIAGNOSIS — L97521 Non-pressure chronic ulcer of other part of left foot limited to breakdown of skin: Secondary | ICD-10-CM | POA: Diagnosis not present

## 2017-05-23 DIAGNOSIS — I5022 Chronic systolic (congestive) heart failure: Secondary | ICD-10-CM | POA: Diagnosis not present

## 2017-05-30 DIAGNOSIS — E782 Mixed hyperlipidemia: Secondary | ICD-10-CM | POA: Diagnosis not present

## 2017-05-30 DIAGNOSIS — I482 Chronic atrial fibrillation: Secondary | ICD-10-CM | POA: Diagnosis not present

## 2017-05-30 DIAGNOSIS — I5022 Chronic systolic (congestive) heart failure: Secondary | ICD-10-CM | POA: Diagnosis not present

## 2017-05-30 DIAGNOSIS — Z0000 Encounter for general adult medical examination without abnormal findings: Secondary | ICD-10-CM | POA: Diagnosis not present

## 2017-05-30 DIAGNOSIS — E538 Deficiency of other specified B group vitamins: Secondary | ICD-10-CM | POA: Diagnosis not present

## 2017-06-05 DIAGNOSIS — L97512 Non-pressure chronic ulcer of other part of right foot with fat layer exposed: Secondary | ICD-10-CM | POA: Diagnosis not present

## 2017-06-05 DIAGNOSIS — L97521 Non-pressure chronic ulcer of other part of left foot limited to breakdown of skin: Secondary | ICD-10-CM | POA: Diagnosis not present

## 2017-06-19 DIAGNOSIS — L97521 Non-pressure chronic ulcer of other part of left foot limited to breakdown of skin: Secondary | ICD-10-CM | POA: Diagnosis not present

## 2017-06-19 DIAGNOSIS — G609 Hereditary and idiopathic neuropathy, unspecified: Secondary | ICD-10-CM | POA: Diagnosis not present

## 2017-06-19 DIAGNOSIS — L97511 Non-pressure chronic ulcer of other part of right foot limited to breakdown of skin: Secondary | ICD-10-CM | POA: Diagnosis not present

## 2017-06-24 ENCOUNTER — Inpatient Hospital Stay: Payer: PPO | Attending: Internal Medicine

## 2017-06-24 DIAGNOSIS — I429 Cardiomyopathy, unspecified: Secondary | ICD-10-CM | POA: Diagnosis not present

## 2017-06-24 DIAGNOSIS — I2510 Atherosclerotic heart disease of native coronary artery without angina pectoris: Secondary | ICD-10-CM | POA: Diagnosis not present

## 2017-06-24 DIAGNOSIS — I4891 Unspecified atrial fibrillation: Secondary | ICD-10-CM | POA: Diagnosis not present

## 2017-06-24 DIAGNOSIS — N183 Chronic kidney disease, stage 3 (moderate): Secondary | ICD-10-CM | POA: Insufficient documentation

## 2017-06-24 DIAGNOSIS — Z803 Family history of malignant neoplasm of breast: Secondary | ICD-10-CM | POA: Diagnosis not present

## 2017-06-24 DIAGNOSIS — Z87891 Personal history of nicotine dependence: Secondary | ICD-10-CM | POA: Diagnosis not present

## 2017-06-24 DIAGNOSIS — D500 Iron deficiency anemia secondary to blood loss (chronic): Secondary | ICD-10-CM | POA: Diagnosis not present

## 2017-06-24 DIAGNOSIS — D696 Thrombocytopenia, unspecified: Secondary | ICD-10-CM | POA: Diagnosis not present

## 2017-06-24 DIAGNOSIS — Z7982 Long term (current) use of aspirin: Secondary | ICD-10-CM | POA: Diagnosis not present

## 2017-06-24 DIAGNOSIS — F419 Anxiety disorder, unspecified: Secondary | ICD-10-CM | POA: Diagnosis not present

## 2017-06-24 DIAGNOSIS — Z87442 Personal history of urinary calculi: Secondary | ICD-10-CM | POA: Insufficient documentation

## 2017-06-24 DIAGNOSIS — I509 Heart failure, unspecified: Secondary | ICD-10-CM | POA: Diagnosis not present

## 2017-06-24 DIAGNOSIS — I252 Old myocardial infarction: Secondary | ICD-10-CM | POA: Diagnosis not present

## 2017-06-24 DIAGNOSIS — I129 Hypertensive chronic kidney disease with stage 1 through stage 4 chronic kidney disease, or unspecified chronic kidney disease: Secondary | ICD-10-CM | POA: Diagnosis not present

## 2017-06-24 DIAGNOSIS — Z79899 Other long term (current) drug therapy: Secondary | ICD-10-CM | POA: Diagnosis not present

## 2017-06-24 LAB — CBC WITH DIFFERENTIAL/PLATELET
Basophils Absolute: 0 10*3/uL (ref 0–0.1)
Basophils Relative: 1 %
Eosinophils Absolute: 0.1 10*3/uL (ref 0–0.7)
Eosinophils Relative: 2 %
HEMATOCRIT: 30.2 % — AB (ref 40.0–52.0)
HEMOGLOBIN: 10.4 g/dL — AB (ref 13.0–18.0)
LYMPHS ABS: 1.7 10*3/uL (ref 1.0–3.6)
LYMPHS PCT: 42 %
MCH: 33.3 pg (ref 26.0–34.0)
MCHC: 34.4 g/dL (ref 32.0–36.0)
MCV: 97 fL (ref 80.0–100.0)
MONOS PCT: 21 %
Monocytes Absolute: 0.8 10*3/uL (ref 0.2–1.0)
NEUTROS PCT: 34 %
Neutro Abs: 1.4 10*3/uL (ref 1.4–6.5)
Platelets: 116 10*3/uL — ABNORMAL LOW (ref 150–440)
RBC: 3.12 MIL/uL — ABNORMAL LOW (ref 4.40–5.90)
RDW: 15.7 % — AB (ref 11.5–14.5)
WBC: 4 10*3/uL (ref 3.8–10.6)

## 2017-06-24 LAB — COMPREHENSIVE METABOLIC PANEL
ALBUMIN: 4.7 g/dL (ref 3.5–5.0)
ALK PHOS: 69 U/L (ref 38–126)
ALT: 15 U/L — ABNORMAL LOW (ref 17–63)
ANION GAP: 9 (ref 5–15)
AST: 24 U/L (ref 15–41)
BUN: 27 mg/dL — ABNORMAL HIGH (ref 6–20)
CALCIUM: 9.2 mg/dL (ref 8.9–10.3)
CHLORIDE: 110 mmol/L (ref 101–111)
CO2: 22 mmol/L (ref 22–32)
Creatinine, Ser: 1.48 mg/dL — ABNORMAL HIGH (ref 0.61–1.24)
GFR calc Af Amer: 48 mL/min — ABNORMAL LOW (ref >60)
GFR calc non Af Amer: 42 mL/min — ABNORMAL LOW (ref >60)
GLUCOSE: 102 mg/dL — AB (ref 65–99)
Potassium: 4.4 mmol/L (ref 3.5–5.1)
SODIUM: 141 mmol/L (ref 135–145)
Total Bilirubin: 1.3 mg/dL — ABNORMAL HIGH (ref 0.3–1.2)
Total Protein: 8.2 g/dL — ABNORMAL HIGH (ref 6.5–8.1)

## 2017-06-24 LAB — IRON AND TIBC
Iron: 105 ug/dL (ref 45–182)
Saturation Ratios: 32 % (ref 17.9–39.5)
TIBC: 325 ug/dL (ref 250–450)
UIBC: 220 ug/dL

## 2017-06-24 LAB — FERRITIN: Ferritin: 77 ng/mL (ref 24–336)

## 2017-06-25 ENCOUNTER — Inpatient Hospital Stay: Payer: PPO

## 2017-06-25 ENCOUNTER — Inpatient Hospital Stay (HOSPITAL_BASED_OUTPATIENT_CLINIC_OR_DEPARTMENT_OTHER): Payer: PPO | Admitting: Internal Medicine

## 2017-06-25 VITALS — BP 161/78 | HR 58 | Temp 95.7°F | Resp 16

## 2017-06-25 VITALS — BP 151/69 | HR 77 | Temp 98.5°F | Resp 16 | Wt 216.4 lb

## 2017-06-25 DIAGNOSIS — I252 Old myocardial infarction: Secondary | ICD-10-CM | POA: Diagnosis not present

## 2017-06-25 DIAGNOSIS — I2510 Atherosclerotic heart disease of native coronary artery without angina pectoris: Secondary | ICD-10-CM | POA: Diagnosis not present

## 2017-06-25 DIAGNOSIS — I129 Hypertensive chronic kidney disease with stage 1 through stage 4 chronic kidney disease, or unspecified chronic kidney disease: Secondary | ICD-10-CM | POA: Diagnosis not present

## 2017-06-25 DIAGNOSIS — I429 Cardiomyopathy, unspecified: Secondary | ICD-10-CM | POA: Diagnosis not present

## 2017-06-25 DIAGNOSIS — D500 Iron deficiency anemia secondary to blood loss (chronic): Secondary | ICD-10-CM

## 2017-06-25 DIAGNOSIS — N183 Chronic kidney disease, stage 3 (moderate): Secondary | ICD-10-CM | POA: Diagnosis not present

## 2017-06-25 DIAGNOSIS — I4891 Unspecified atrial fibrillation: Secondary | ICD-10-CM | POA: Diagnosis not present

## 2017-06-25 DIAGNOSIS — Z87442 Personal history of urinary calculi: Secondary | ICD-10-CM | POA: Diagnosis not present

## 2017-06-25 DIAGNOSIS — F419 Anxiety disorder, unspecified: Secondary | ICD-10-CM

## 2017-06-25 DIAGNOSIS — D696 Thrombocytopenia, unspecified: Secondary | ICD-10-CM | POA: Diagnosis not present

## 2017-06-25 DIAGNOSIS — Z79899 Other long term (current) drug therapy: Secondary | ICD-10-CM | POA: Diagnosis not present

## 2017-06-25 DIAGNOSIS — I509 Heart failure, unspecified: Secondary | ICD-10-CM

## 2017-06-25 DIAGNOSIS — Z87891 Personal history of nicotine dependence: Secondary | ICD-10-CM

## 2017-06-25 DIAGNOSIS — Z803 Family history of malignant neoplasm of breast: Secondary | ICD-10-CM

## 2017-06-25 DIAGNOSIS — Z7982 Long term (current) use of aspirin: Secondary | ICD-10-CM

## 2017-06-25 MED ORDER — SODIUM CHLORIDE 0.9 % IV SOLN
Freq: Once | INTRAVENOUS | Status: AC
  Administered 2017-06-25: 09:00:00 via INTRAVENOUS
  Filled 2017-06-25: qty 1000

## 2017-06-25 MED ORDER — IRON SUCROSE 20 MG/ML IV SOLN
200.0000 mg | Freq: Once | INTRAVENOUS | Status: AC
  Administered 2017-06-25: 200 mg via INTRAVENOUS
  Filled 2017-06-25: qty 10

## 2017-06-25 NOTE — Assessment & Plan Note (Addendum)
#   Anemia mild hemoglobin 11-12 [at least since 2012]. Likely secondary to CKD-III/iron deficiency [creatinine-1./ Ferritin 60 saturation 30%.] vs ? MDS [no Bmbx].   # Today hemoglobin is 10.7; proceed with IV Venofer today.  Continue by mouth iron every other day [secondary to GI intolerance].   #Mild intermittent thrombocytopenia platelets-116 today; ITP versus MDS.  Asymptomatic monitor for now.  # Patient will follow-up with me in approximately 6 months CBC iron studies and ferritin; possible IV venofer.   #The above plan of care was discussed with the patient and his daughter in detail.

## 2017-06-25 NOTE — Patient Instructions (Signed)

## 2017-06-25 NOTE — Progress Notes (Signed)
[name]  Patient Care Team: Rusty Aus, MD as PCP - General (Internal Medicine) Alisa Graff, FNP as Nurse Practitioner (Family Medicine) Ubaldo Glassing Javier Docker, MD as Consulting Physician (Cardiology) Erby Pian, MD as Referring Physician (Specialist)  CHIEF COMPLAINTS/PURPOSE OF CONSULTATION:   # 2012- Anemia 11-12- sec to CKD/IDA? MDS [no BMBx] [March 2017-sat-11%; ferritin-42] [Feb 2017- EGD/colo-July 2015 Dr.Skulskie];April 2017-PO Iron PO.   # Intermittent thrombocytopenia 120s-140s [Dec 2016-CT- neg cirrhosis/splenomegaly]  # CKD [creatinine 1.2- 1.4]; CAD/CHF  HISTORY OF PRESENTING ILLNESS:  Andres Gutierrez 84 y.o.  male history of above history of chronic anemia and intermittent thrombocytopenia is here for follow-up.  Patient denies any unusual shortness of breath or cough.  Denies any blood in stools or black colored stools.  He takes iron pills every other day.  Denies any nausea vomiting.  ROS: A complete 10 point review of system is done which is negative except mentioned above in history of present illness  MEDICAL HISTORY:  Past Medical History:  Diagnosis Date  . Anemia   . Anxiety   . Atrial fibrillation ([place])   . Cardiomyopathy ([place])   . CHF (congestive heart failure) ([place])   . Chronic kidney disease    kidney stones  . Coronary artery disease   . Cough   . Dysrhythmia   . Hypertension   . Lymphadenopathy, hilar   . MI (myocardial infarction) ([place])    x 2  . Wheezing     SURGICAL HISTORY: Past Surgical History:  Procedure Laterality Date  . BRONCHIAL NEEDLE ASPIRATION BIOPSY N/A 12/31/2014   Procedure: BRONCHIAL NEEDLE ASPIRATION BIOPSIES from carina;  Surgeon: Flora Lipps, MD;  Location: [facility] ORS;  Service: Cardiopulmonary;  Laterality: N/A;  . CARDIAC CATHETERIZATION N/A 12/28/2015   Procedure: Left Heart Cath and Cors/Grafts Angiography;  Surgeon: Yolonda Kida, MD;  Location: [place] CV [facility];   Service: Cardiovascular;  Laterality: N/A;  . CATARACT EXTRACTION W/PHACO Left 08/01/2016   Procedure: CATARACT EXTRACTION PHACO AND INTRAOCULAR LENS PLACEMENT ([place]) Left;  Surgeon: Leandrew Koyanagi, MD;  Location: [place];  Service: Ophthalmology;  Laterality: Left;  . CORONARY ANGIOPLASTY WITH STENT PLACEMENT    . CORONARY ARTERY BYPASS GRAFT    . ENDOBRONCHIAL ULTRASOUND N/A 12/31/2014   Procedure: ENDOBRONCHIAL ULTRASOUND;  Surgeon: Flora Lipps, MD;  Location: [facility];  Service: Cardiopulmonary;  Laterality: N/A;  . ESOPHAGOGASTRODUODENOSCOPY (EGD) WITH PROPOFOL N/A 06/20/2015   Procedure: ESOPHAGOGASTRODUODENOSCOPY (EGD) WITH PROPOFOL;  Surgeon: Lollie Sails, MD;  Location: [facility] ENDOSCOPY;  Service: Endoscopy;  Laterality: N/A;    SOCIAL HISTORY: lives in [place]; alone. 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  Social History   Socioeconomic History  . Marital status: Widowed    Spouse name: Not on file  . Number of children: 2  . Years of education: college  . Highest education level: Some college, no degree  Social Needs  . Financial resource strain: Not hard at all  . Food insecurity - worry: Never true  . Food insecurity - inability: Never true  . Transportation needs - medical: No  . Transportation needs - non-medical: No  Occupational History  . Occupation: retired  Tobacco Use  . Smoking status: Former Smoker    Last attempt to quit: 06/27/1977    Years since quitting: 40.0  . Smokeless tobacco: Current User    Types: Chew  Substance and Sexual Activity  . Alcohol use: Yes    Comment: occational almost rare  once a year  . Drug use: No  . Sexual activity: No    Birth control/protection: Abstinence  Other Topics Concern  . Not on file  Social History Narrative  . Not on file    FAMILY HISTORY: Family History  Problem Relation Age of Onset  . CAD Mother   . Colon cancer Father     ALLERGIES:  is allergic to 5ht3 receptor antagonists; diphenhydramine hcl;  escitalopram; maxidex [dexamethasone]; prednisone; serotonin; vytorin [ezetimibe-simvastatin]; and zocor [simvastatin].  MEDICATIONS:  Current Outpatient Medications  Medication Sig Dispense Refill  . acetaminophen (TYLENOL) 500 MG tablet Take 500 mg by mouth every 6 (six) hours as needed for mild pain or moderate pain.    Marland Kitchen aspirin EC 81 MG tablet Take 81 mg by mouth daily.    Marland Kitchen atorvastatin (LIPITOR) 40 MG tablet Take 1 tablet (40 mg total) by mouth daily at 6 PM. 30 tablet 0  . digoxin (LANOXIN) 0.125 MG tablet Take 0.0625 mg by mouth daily.    Marland Kitchen docusate sodium (COLACE) 100 MG capsule Take 100 mg by mouth 2 (two) times daily.    . ferrous sulfate 325 (65 FE) MG tablet Take 1 tablet (325 mg total) by mouth daily. (Patient taking differently: Take 325 mg by mouth daily with breakfast. ) 30 tablet 3  . furosemide (LASIX) 20 MG tablet Take 20 mg by mouth as needed. For weight gain greater than 2 pounds in 24 hours or greater than 5 pounds in one week    . metoprolol tartrate (LOPRESSOR) 12.5 mg TABS tablet Take 12.5 mg by mouth 2 (two) times daily.    . nitroGLYCERIN (NITROSTAT) 0.4 MG SL tablet Place 0.4 mg under the tongue every 5 (five) minutes as needed for chest pain.    . pantoprazole (PROTONIX) 40 MG tablet Take 40 mg by mouth 2 (two) times daily before a meal.     . ranolazine (RANEXA) 500 MG 12 hr tablet Take 1 tablet (500 mg total) by mouth 2 (two) times daily. 60 tablet 0  . sucralfate (CARAFATE) 1 G tablet Take 1 g by mouth 2 (two) times daily.     . tamsulosin (FLOMAX) 0.4 MG CAPS capsule Take 0.4 mg by mouth daily after supper.     . vitamin C (VITAMIN C) 250 MG tablet Take 1 tablet (250 mg total) by mouth daily. 30 tablet 0  . famotidine (PEPCID) 20 MG tablet Take 1 tablet (20 mg total) by mouth 2 (two) times daily. 30 tablet 0   No current facility-administered medications for this visit.       Marland Kitchen  PHYSICAL EXAMINATION: ECOG PERFORMANCE STATUS: 0 - Asymptomatic  Vitals:    06/25/17 0835  BP: (!) 151/69  Pulse: 77  Resp: 16  Temp: 98.5 F (36.9 C)   Filed Weights   06/25/17 0835  Weight: 216 lb 6.1 oz (98.1 kg)    GENERAL: Well-nourished well-developed; Alert, no distress and comfortable.   Accompanied by family.He is walking with a rolling walker. HEENT-withi is an outpatientn normal limits. Oral mucosa moist no oral ulcerations. No thrush. Chest decreased breath sounds at bases. No visible crackles. Heart regular and no murmurs. 1+ bilateral lower extremity swelling  Abdomen soft nontender nondistended. No hepatomegaly. Bowel sounds present. Neurologic no deficits.   LABORATORY DATA:  I have reviewed the data as listed Lab Results  Component Value Date   WBC 4.0 06/24/2017   HGB 10.4 (L) 06/24/2017   HCT 30.2 (L) 06/24/2017  MCV 97.0 06/24/2017   PLT 116 (L) 06/24/2017   Recent Labs    12/21/16 0828 02/25/17 0909 06/24/17 0806  NA 138 139 141  K 4.3 4.4 4.4  CL 104 101 110  CO2 23 23 22   GLUCOSE 112* 112* 102*  BUN 24* 22* 27*  CREATININE 1.55* 1.69* 1.48*  CALCIUM 9.6 9.8 9.2  GFRNONAA 40* 36* 42*  GFRAA 46* 41* 48*  PROT  --  8.1 8.2*  ALBUMIN  --  4.7 4.7  AST  --  30 24  ALT  --  17 15*  ALKPHOS  --  63 69  BILITOT  --  1.1 1.3*   ASSESSMENT & PLAN:   Iron deficiency anemia due to chronic blood loss # Anemia mild hemoglobin 11-12 [at least since 2012]. Likely secondary to CKD-III/iron deficiency [creatinine-1./ Ferritin 60 saturation 30%.] vs ? MDS [no Bmbx].   # Today hemoglobin is 10.7; proceed with IV Venofer today.  Continue by mouth iron every other day [secondary to GI intolerance].   #Mild intermittent thrombocytopenia platelets-116 today; ITP versus MDS.  Asymptomatic monitor for now.  # Patient will follow-up with me in approximately 6 months CBC iron studies and ferritin; possible IV venofer.   #The above plan of care was discussed with the patient and his daughter in detail.      Cammie Sickle, MD 06/25/2017 10:00 AM

## 2017-06-28 DIAGNOSIS — I2510 Atherosclerotic heart disease of native coronary artery without angina pectoris: Secondary | ICD-10-CM | POA: Diagnosis not present

## 2017-06-30 DIAGNOSIS — E860 Dehydration: Secondary | ICD-10-CM | POA: Diagnosis not present

## 2017-06-30 DIAGNOSIS — K529 Noninfective gastroenteritis and colitis, unspecified: Secondary | ICD-10-CM | POA: Diagnosis not present

## 2017-06-30 DIAGNOSIS — I5022 Chronic systolic (congestive) heart failure: Secondary | ICD-10-CM | POA: Diagnosis not present

## 2017-06-30 DIAGNOSIS — R112 Nausea with vomiting, unspecified: Secondary | ICD-10-CM | POA: Diagnosis not present

## 2017-06-30 DIAGNOSIS — N400 Enlarged prostate without lower urinary tract symptoms: Secondary | ICD-10-CM | POA: Diagnosis not present

## 2017-06-30 DIAGNOSIS — I252 Old myocardial infarction: Secondary | ICD-10-CM | POA: Diagnosis not present

## 2017-06-30 DIAGNOSIS — Z7982 Long term (current) use of aspirin: Secondary | ICD-10-CM | POA: Diagnosis not present

## 2017-06-30 DIAGNOSIS — N179 Acute kidney failure, unspecified: Secondary | ICD-10-CM | POA: Diagnosis not present

## 2017-06-30 DIAGNOSIS — I25810 Atherosclerosis of coronary artery bypass graft(s) without angina pectoris: Secondary | ICD-10-CM | POA: Diagnosis not present

## 2017-06-30 DIAGNOSIS — N189 Chronic kidney disease, unspecified: Secondary | ICD-10-CM | POA: Diagnosis not present

## 2017-06-30 DIAGNOSIS — Z87891 Personal history of nicotine dependence: Secondary | ICD-10-CM | POA: Diagnosis not present

## 2017-06-30 DIAGNOSIS — I499 Cardiac arrhythmia, unspecified: Secondary | ICD-10-CM | POA: Diagnosis not present

## 2017-06-30 DIAGNOSIS — R109 Unspecified abdominal pain: Secondary | ICD-10-CM | POA: Diagnosis not present

## 2017-06-30 DIAGNOSIS — M4316 Spondylolisthesis, lumbar region: Secondary | ICD-10-CM | POA: Diagnosis not present

## 2017-06-30 DIAGNOSIS — I2510 Atherosclerotic heart disease of native coronary artery without angina pectoris: Secondary | ICD-10-CM | POA: Diagnosis not present

## 2017-06-30 DIAGNOSIS — K219 Gastro-esophageal reflux disease without esophagitis: Secondary | ICD-10-CM | POA: Diagnosis not present

## 2017-06-30 DIAGNOSIS — R05 Cough: Secondary | ICD-10-CM | POA: Diagnosis not present

## 2017-06-30 DIAGNOSIS — Z951 Presence of aortocoronary bypass graft: Secondary | ICD-10-CM | POA: Diagnosis not present

## 2017-06-30 DIAGNOSIS — D649 Anemia, unspecified: Secondary | ICD-10-CM | POA: Diagnosis not present

## 2017-06-30 DIAGNOSIS — D696 Thrombocytopenia, unspecified: Secondary | ICD-10-CM | POA: Diagnosis not present

## 2017-06-30 DIAGNOSIS — I130 Hypertensive heart and chronic kidney disease with heart failure and stage 1 through stage 4 chronic kidney disease, or unspecified chronic kidney disease: Secondary | ICD-10-CM | POA: Diagnosis not present

## 2017-06-30 DIAGNOSIS — R197 Diarrhea, unspecified: Secondary | ICD-10-CM | POA: Diagnosis not present

## 2017-06-30 DIAGNOSIS — I4891 Unspecified atrial fibrillation: Secondary | ICD-10-CM | POA: Diagnosis not present

## 2017-07-01 DIAGNOSIS — K529 Noninfective gastroenteritis and colitis, unspecified: Secondary | ICD-10-CM | POA: Diagnosis not present

## 2017-07-01 DIAGNOSIS — I4891 Unspecified atrial fibrillation: Secondary | ICD-10-CM | POA: Diagnosis not present

## 2017-07-01 DIAGNOSIS — I5022 Chronic systolic (congestive) heart failure: Secondary | ICD-10-CM | POA: Diagnosis not present

## 2017-07-01 DIAGNOSIS — N179 Acute kidney failure, unspecified: Secondary | ICD-10-CM | POA: Diagnosis not present

## 2017-07-01 DIAGNOSIS — D696 Thrombocytopenia, unspecified: Secondary | ICD-10-CM | POA: Diagnosis not present

## 2017-07-01 DIAGNOSIS — Z951 Presence of aortocoronary bypass graft: Secondary | ICD-10-CM | POA: Diagnosis not present

## 2017-07-01 DIAGNOSIS — I2510 Atherosclerotic heart disease of native coronary artery without angina pectoris: Secondary | ICD-10-CM | POA: Diagnosis not present

## 2017-07-01 DIAGNOSIS — N189 Chronic kidney disease, unspecified: Secondary | ICD-10-CM | POA: Diagnosis not present

## 2017-07-02 DIAGNOSIS — I5022 Chronic systolic (congestive) heart failure: Secondary | ICD-10-CM | POA: Diagnosis not present

## 2017-07-02 DIAGNOSIS — K529 Noninfective gastroenteritis and colitis, unspecified: Secondary | ICD-10-CM | POA: Diagnosis not present

## 2017-07-02 DIAGNOSIS — N179 Acute kidney failure, unspecified: Secondary | ICD-10-CM | POA: Diagnosis not present

## 2017-07-02 DIAGNOSIS — I2510 Atherosclerotic heart disease of native coronary artery without angina pectoris: Secondary | ICD-10-CM | POA: Diagnosis not present

## 2017-07-02 DIAGNOSIS — N189 Chronic kidney disease, unspecified: Secondary | ICD-10-CM | POA: Diagnosis not present

## 2017-07-02 DIAGNOSIS — Z951 Presence of aortocoronary bypass graft: Secondary | ICD-10-CM | POA: Diagnosis not present

## 2017-07-02 DIAGNOSIS — I4891 Unspecified atrial fibrillation: Secondary | ICD-10-CM | POA: Diagnosis not present

## 2017-07-03 DIAGNOSIS — N179 Acute kidney failure, unspecified: Secondary | ICD-10-CM | POA: Diagnosis not present

## 2017-07-03 DIAGNOSIS — K529 Noninfective gastroenteritis and colitis, unspecified: Secondary | ICD-10-CM | POA: Diagnosis not present

## 2017-07-03 DIAGNOSIS — I2510 Atherosclerotic heart disease of native coronary artery without angina pectoris: Secondary | ICD-10-CM | POA: Diagnosis not present

## 2017-07-03 DIAGNOSIS — Z951 Presence of aortocoronary bypass graft: Secondary | ICD-10-CM | POA: Diagnosis not present

## 2017-07-03 DIAGNOSIS — D696 Thrombocytopenia, unspecified: Secondary | ICD-10-CM | POA: Diagnosis not present

## 2017-07-03 DIAGNOSIS — N189 Chronic kidney disease, unspecified: Secondary | ICD-10-CM | POA: Diagnosis not present

## 2017-07-10 ENCOUNTER — Other Ambulatory Visit: Payer: Self-pay | Admitting: *Deleted

## 2017-07-10 DIAGNOSIS — A084 Viral intestinal infection, unspecified: Secondary | ICD-10-CM | POA: Diagnosis not present

## 2017-07-10 DIAGNOSIS — D696 Thrombocytopenia, unspecified: Secondary | ICD-10-CM | POA: Diagnosis not present

## 2017-07-10 DIAGNOSIS — I5022 Chronic systolic (congestive) heart failure: Secondary | ICD-10-CM | POA: Diagnosis not present

## 2017-07-10 DIAGNOSIS — E860 Dehydration: Secondary | ICD-10-CM | POA: Diagnosis not present

## 2017-07-10 NOTE — Patient Outreach (Signed)
[facility]) Care Management  07/10/2017  EILAM SHREWSBURY 1930-09-08 [id number]  Referral via [place]; member discharged from [facility] 07/03/2017.  Telephone call to patient who was advised of reason for call & [facility] care management services.  HIPPA verification received from patient.  Patient states he went to emergency room 02/17 with uncontrollable diarrhea & vomiting for several hours. States admitted to [facility] in [place].  States he is home now and doing well. States he is not sure what caused "stomach upset" but had eaten at fast food place prior to having episode of vomiting & diarrhea.  States he has had no further problems since coming home.   States he went to hospital follow up appointment with primary care provider today & was advised that" everything checked out fine". States she has all of his medications & is taking as prescribed. States he has no activity restrictions & is able to drive to his appointments. States daughter lives close by & prepares his weekly medications & checks on him frequently.  Patient agreed to complete "TOC" assessment. He advised that he did not need case management at this time. States he has someone at door and had to hang up. Advised he had [facility] magnet with phone number  & would call if needed services.   Plan: Send to care management assistant to close out.  Sherrin Daisy, RN BSN [place] Management Coordinator [facility] Care Management  [phone]

## 2017-07-17 DIAGNOSIS — L97521 Non-pressure chronic ulcer of other part of left foot limited to breakdown of skin: Secondary | ICD-10-CM | POA: Diagnosis not present

## 2017-07-31 DIAGNOSIS — M2042 Other hammer toe(s) (acquired), left foot: Secondary | ICD-10-CM | POA: Diagnosis not present

## 2017-07-31 DIAGNOSIS — L97521 Non-pressure chronic ulcer of other part of left foot limited to breakdown of skin: Secondary | ICD-10-CM | POA: Diagnosis not present

## 2017-08-14 DIAGNOSIS — L97521 Non-pressure chronic ulcer of other part of left foot limited to breakdown of skin: Secondary | ICD-10-CM | POA: Diagnosis not present

## 2017-08-22 DIAGNOSIS — M1712 Unilateral primary osteoarthritis, left knee: Secondary | ICD-10-CM | POA: Diagnosis not present

## 2017-08-22 DIAGNOSIS — M1711 Unilateral primary osteoarthritis, right knee: Secondary | ICD-10-CM | POA: Diagnosis not present

## 2017-08-22 DIAGNOSIS — M170 Bilateral primary osteoarthritis of knee: Secondary | ICD-10-CM | POA: Insufficient documentation

## 2017-08-23 ENCOUNTER — Other Ambulatory Visit: Payer: Self-pay | Admitting: Unknown Physician Specialty

## 2017-08-23 DIAGNOSIS — M1712 Unilateral primary osteoarthritis, left knee: Secondary | ICD-10-CM

## 2017-08-28 DIAGNOSIS — I482 Chronic atrial fibrillation: Secondary | ICD-10-CM | POA: Diagnosis not present

## 2017-08-28 DIAGNOSIS — Z79899 Other long term (current) drug therapy: Secondary | ICD-10-CM | POA: Diagnosis not present

## 2017-08-28 DIAGNOSIS — I5022 Chronic systolic (congestive) heart failure: Secondary | ICD-10-CM | POA: Diagnosis not present

## 2017-08-28 DIAGNOSIS — E538 Deficiency of other specified B group vitamins: Secondary | ICD-10-CM | POA: Diagnosis not present

## 2017-08-28 DIAGNOSIS — E782 Mixed hyperlipidemia: Secondary | ICD-10-CM | POA: Diagnosis not present

## 2017-08-29 ENCOUNTER — Ambulatory Visit
Admission: RE | Admit: 2017-08-29 | Discharge: 2017-08-29 | Disposition: A | Discharge status: Home / Self Care | Payer: PPO | Source: Ambulatory Visit | Attending: Unknown Physician Specialty | Admitting: Unknown Physician Specialty

## 2017-08-29 DIAGNOSIS — M1712 Unilateral primary osteoarthritis, left knee: Secondary | ICD-10-CM | POA: Insufficient documentation

## 2017-08-29 DIAGNOSIS — M2342 Loose body in knee, left knee: Secondary | ICD-10-CM | POA: Diagnosis not present

## 2017-08-29 DIAGNOSIS — M7122 Synovial cyst of popliteal space [Baker], left knee: Secondary | ICD-10-CM | POA: Diagnosis not present

## 2017-08-29 DIAGNOSIS — S83232A Complex tear of medial meniscus, current injury, left knee, initial encounter: Secondary | ICD-10-CM | POA: Insufficient documentation

## 2017-09-11 DIAGNOSIS — G609 Hereditary and idiopathic neuropathy, unspecified: Secondary | ICD-10-CM | POA: Diagnosis not present

## 2017-09-11 DIAGNOSIS — L97521 Non-pressure chronic ulcer of other part of left foot limited to breakdown of skin: Secondary | ICD-10-CM | POA: Diagnosis not present

## 2017-09-25 DIAGNOSIS — L97521 Non-pressure chronic ulcer of other part of left foot limited to breakdown of skin: Secondary | ICD-10-CM | POA: Diagnosis not present

## 2017-10-16 DIAGNOSIS — L97521 Non-pressure chronic ulcer of other part of left foot limited to breakdown of skin: Secondary | ICD-10-CM | POA: Diagnosis not present

## 2017-11-06 DIAGNOSIS — L97521 Non-pressure chronic ulcer of other part of left foot limited to breakdown of skin: Secondary | ICD-10-CM | POA: Diagnosis not present

## 2017-11-22 DIAGNOSIS — L02619 Cutaneous abscess of unspecified foot: Secondary | ICD-10-CM | POA: Diagnosis not present

## 2017-11-22 DIAGNOSIS — G609 Hereditary and idiopathic neuropathy, unspecified: Secondary | ICD-10-CM | POA: Diagnosis not present

## 2017-11-22 DIAGNOSIS — L97521 Non-pressure chronic ulcer of other part of left foot limited to breakdown of skin: Secondary | ICD-10-CM | POA: Diagnosis not present

## 2017-11-25 DIAGNOSIS — Z79899 Other long term (current) drug therapy: Secondary | ICD-10-CM | POA: Diagnosis not present

## 2017-11-25 DIAGNOSIS — E782 Mixed hyperlipidemia: Secondary | ICD-10-CM | POA: Diagnosis not present

## 2017-11-25 DIAGNOSIS — E538 Deficiency of other specified B group vitamins: Secondary | ICD-10-CM | POA: Diagnosis not present

## 2017-11-27 DIAGNOSIS — L97521 Non-pressure chronic ulcer of other part of left foot limited to breakdown of skin: Secondary | ICD-10-CM | POA: Diagnosis not present

## 2017-12-02 DIAGNOSIS — E538 Deficiency of other specified B group vitamins: Secondary | ICD-10-CM | POA: Diagnosis not present

## 2017-12-02 DIAGNOSIS — D500 Iron deficiency anemia secondary to blood loss (chronic): Secondary | ICD-10-CM | POA: Diagnosis not present

## 2017-12-02 DIAGNOSIS — Z125 Encounter for screening for malignant neoplasm of prostate: Secondary | ICD-10-CM | POA: Diagnosis not present

## 2017-12-02 DIAGNOSIS — Z0000 Encounter for general adult medical examination without abnormal findings: Secondary | ICD-10-CM | POA: Diagnosis not present

## 2017-12-02 DIAGNOSIS — E782 Mixed hyperlipidemia: Secondary | ICD-10-CM | POA: Diagnosis not present

## 2017-12-02 DIAGNOSIS — I5022 Chronic systolic (congestive) heart failure: Secondary | ICD-10-CM | POA: Diagnosis not present

## 2017-12-04 DIAGNOSIS — G609 Hereditary and idiopathic neuropathy, unspecified: Secondary | ICD-10-CM | POA: Diagnosis not present

## 2017-12-04 DIAGNOSIS — L02619 Cutaneous abscess of unspecified foot: Secondary | ICD-10-CM | POA: Diagnosis not present

## 2017-12-04 DIAGNOSIS — L97521 Non-pressure chronic ulcer of other part of left foot limited to breakdown of skin: Secondary | ICD-10-CM | POA: Diagnosis not present

## 2017-12-18 DIAGNOSIS — G609 Hereditary and idiopathic neuropathy, unspecified: Secondary | ICD-10-CM | POA: Diagnosis not present

## 2017-12-18 DIAGNOSIS — L97521 Non-pressure chronic ulcer of other part of left foot limited to breakdown of skin: Secondary | ICD-10-CM | POA: Diagnosis not present

## 2017-12-19 ENCOUNTER — Inpatient Hospital Stay: Payer: PPO | Attending: Internal Medicine

## 2017-12-19 DIAGNOSIS — N183 Chronic kidney disease, stage 3 (moderate): Secondary | ICD-10-CM | POA: Insufficient documentation

## 2017-12-19 DIAGNOSIS — Z87442 Personal history of urinary calculi: Secondary | ICD-10-CM | POA: Insufficient documentation

## 2017-12-19 DIAGNOSIS — I4891 Unspecified atrial fibrillation: Secondary | ICD-10-CM | POA: Diagnosis not present

## 2017-12-19 DIAGNOSIS — F419 Anxiety disorder, unspecified: Secondary | ICD-10-CM | POA: Insufficient documentation

## 2017-12-19 DIAGNOSIS — D649 Anemia, unspecified: Secondary | ICD-10-CM | POA: Insufficient documentation

## 2017-12-19 DIAGNOSIS — I129 Hypertensive chronic kidney disease with stage 1 through stage 4 chronic kidney disease, or unspecified chronic kidney disease: Secondary | ICD-10-CM | POA: Diagnosis not present

## 2017-12-19 DIAGNOSIS — I509 Heart failure, unspecified: Secondary | ICD-10-CM | POA: Diagnosis not present

## 2017-12-19 DIAGNOSIS — I252 Old myocardial infarction: Secondary | ICD-10-CM | POA: Diagnosis not present

## 2017-12-19 DIAGNOSIS — Z87891 Personal history of nicotine dependence: Secondary | ICD-10-CM | POA: Diagnosis not present

## 2017-12-19 DIAGNOSIS — D696 Thrombocytopenia, unspecified: Secondary | ICD-10-CM | POA: Diagnosis not present

## 2017-12-19 DIAGNOSIS — D500 Iron deficiency anemia secondary to blood loss (chronic): Secondary | ICD-10-CM | POA: Diagnosis not present

## 2017-12-19 DIAGNOSIS — Z79899 Other long term (current) drug therapy: Secondary | ICD-10-CM | POA: Diagnosis not present

## 2017-12-19 DIAGNOSIS — Z800 Family history of malignant neoplasm of digestive organs: Secondary | ICD-10-CM | POA: Insufficient documentation

## 2017-12-19 LAB — CBC WITH DIFFERENTIAL/PLATELET
Basophils Absolute: 0 10*3/uL (ref 0–0.1)
Basophils Relative: 1 %
EOS ABS: 0.1 10*3/uL (ref 0–0.7)
Eosinophils Relative: 2 %
HEMATOCRIT: 30.7 % — AB (ref 40.0–52.0)
HEMOGLOBIN: 10.5 g/dL — AB (ref 13.0–18.0)
LYMPHS ABS: 2 10*3/uL (ref 1.0–3.6)
Lymphocytes Relative: 34 %
MCH: 34.7 pg — AB (ref 26.0–34.0)
MCHC: 34.3 g/dL (ref 32.0–36.0)
MCV: 101.3 fL — ABNORMAL HIGH (ref 80.0–100.0)
MONO ABS: 1.6 10*3/uL — AB (ref 0.2–1.0)
MONOS PCT: 27 %
NEUTROS PCT: 36 %
Neutro Abs: 2.1 10*3/uL (ref 1.4–6.5)
Platelets: 110 10*3/uL — ABNORMAL LOW (ref 150–440)
RBC: 3.03 MIL/uL — ABNORMAL LOW (ref 4.40–5.90)
RDW: 15.9 % — ABNORMAL HIGH (ref 11.5–14.5)
WBC: 5.8 10*3/uL (ref 3.8–10.6)

## 2017-12-19 LAB — COMPREHENSIVE METABOLIC PANEL
ALBUMIN: 4.5 g/dL (ref 3.5–5.0)
ALT: 16 U/L (ref 0–44)
AST: 26 U/L (ref 15–41)
Alkaline Phosphatase: 67 U/L (ref 38–126)
Anion gap: 12 (ref 5–15)
BUN: 28 mg/dL — ABNORMAL HIGH (ref 8–23)
CHLORIDE: 106 mmol/L (ref 98–111)
CO2: 20 mmol/L — AB (ref 22–32)
Calcium: 8.9 mg/dL (ref 8.9–10.3)
Creatinine, Ser: 1.77 mg/dL — ABNORMAL HIGH (ref 0.61–1.24)
GFR calc non Af Amer: 34 mL/min — ABNORMAL LOW (ref >60)
GFR, EST AFRICAN AMERICAN: 39 mL/min — AB (ref >60)
GLUCOSE: 102 mg/dL — AB (ref 70–99)
Potassium: 4.5 mmol/L (ref 3.5–5.1)
SODIUM: 138 mmol/L (ref 135–145)
Total Bilirubin: 0.5 mg/dL (ref 0.3–1.2)
Total Protein: 8.1 g/dL (ref 6.5–8.1)

## 2017-12-19 LAB — IRON AND TIBC
Iron: 116 ug/dL (ref 45–182)
Saturation Ratios: 35 % (ref 17.9–39.5)
TIBC: 328 ug/dL (ref 250–450)
UIBC: 212 ug/dL

## 2017-12-19 LAB — FERRITIN: Ferritin: 114 ng/mL (ref 24–336)

## 2017-12-24 ENCOUNTER — Inpatient Hospital Stay (HOSPITAL_BASED_OUTPATIENT_CLINIC_OR_DEPARTMENT_OTHER): Payer: PPO | Admitting: Internal Medicine

## 2017-12-24 ENCOUNTER — Inpatient Hospital Stay: Payer: PPO

## 2017-12-24 VITALS — BP 117/70 | HR 81 | Temp 97.2°F | Wt 212.5 lb

## 2017-12-24 DIAGNOSIS — I4891 Unspecified atrial fibrillation: Secondary | ICD-10-CM | POA: Diagnosis not present

## 2017-12-24 DIAGNOSIS — Z87442 Personal history of urinary calculi: Secondary | ICD-10-CM

## 2017-12-24 DIAGNOSIS — D500 Iron deficiency anemia secondary to blood loss (chronic): Secondary | ICD-10-CM

## 2017-12-24 DIAGNOSIS — D696 Thrombocytopenia, unspecified: Secondary | ICD-10-CM

## 2017-12-24 DIAGNOSIS — Z79899 Other long term (current) drug therapy: Secondary | ICD-10-CM

## 2017-12-24 DIAGNOSIS — I129 Hypertensive chronic kidney disease with stage 1 through stage 4 chronic kidney disease, or unspecified chronic kidney disease: Secondary | ICD-10-CM

## 2017-12-24 DIAGNOSIS — N183 Chronic kidney disease, stage 3 (moderate): Secondary | ICD-10-CM

## 2017-12-24 DIAGNOSIS — Z800 Family history of malignant neoplasm of digestive organs: Secondary | ICD-10-CM

## 2017-12-24 DIAGNOSIS — F419 Anxiety disorder, unspecified: Secondary | ICD-10-CM | POA: Diagnosis not present

## 2017-12-24 DIAGNOSIS — D649 Anemia, unspecified: Secondary | ICD-10-CM

## 2017-12-24 DIAGNOSIS — Z87891 Personal history of nicotine dependence: Secondary | ICD-10-CM

## 2017-12-24 DIAGNOSIS — I252 Old myocardial infarction: Secondary | ICD-10-CM | POA: Diagnosis not present

## 2017-12-24 DIAGNOSIS — I509 Heart failure, unspecified: Secondary | ICD-10-CM | POA: Diagnosis not present

## 2017-12-24 NOTE — Assessment & Plan Note (Addendum)
#   Anemia mild hemoglobin 11-12 [at least since 2012]. Likely secondary to CKD-III/iron deficiency [creatinine-1./ Ferritin 114 saturation 35%.] vs ? MDS [no Bmbx].   # Today hemoglobin is 10.5 STABLE. Continue by mouth iron every other day [secondary to GI intolerance].  Iron saturation 35%.  Hold off any IV infusion.  #Mild intermittent thrombocytopenia platelets-110 today; ITP versus MDS.  Asymptomatic monitor for now. Slightly worse.   # CKD Stage III- creat 1.7/ slightly worse. Recommend increase fluid intake.   #The above plan of care was discussed with the patient and his daughter in detail.  # follow up in 6 months/labs-cbc/bmp/iron studies/ferritin- few days prior/possible venofer.

## 2017-12-24 NOTE — Progress Notes (Signed)
[place] NOTE  Patient Care Team: Rusty Aus, MD as PCP - General (Internal Medicine) Alisa Graff, FNP as Nurse Practitioner (Family Medicine) Ubaldo Glassing Javier Docker, MD as Consulting Physician (Cardiology) Erby Pian, MD as Referring Physician (Specialist)  CHIEF COMPLAINTS/PURPOSE OF CONSULTATION:   # 2012- Anemia 11-12- sec to CKD/IDA? MDS [no BMBx] [March 2017-sat-11%; ferritin-42] [Feb 2017- EGD/colo-July 2015 Dr.Skulskie];April 2017-PO Iron PO.   # Intermittent thrombocytopenia 120s-140s [Dec 2016-CT- neg cirrhosis/splenomegaly]  # CKD [creatinine 1.2- 1.4]; CAD/CHF  HISTORY OF PRESENTING ILLNESS:  Andres Gutierrez 84 y.o.  male history of above history of chronic anemia and intermittent thrombocytopenia is here for follow-up.  Patient denies any unusual shortness of breath or cough.  Denies any blood in stools or black colored stools.  He takes iron pills every other day.  Denies any nausea vomiting.  ROS: A complete 10 point review of system is done which is negative except mentioned above in history of present illness  MEDICAL HISTORY:  Past Medical History:  Diagnosis Date  . Anemia   . Anxiety   . Atrial fibrillation ([place])   . Cardiomyopathy ([place])   . CHF (congestive heart failure) ([place])   . Chronic kidney disease    kidney stones  . Coronary artery disease   . Cough   . Dysrhythmia   . Hypertension   . Lymphadenopathy, hilar   . MI (myocardial infarction) ([place])    x 2  . Wheezing     SURGICAL HISTORY: Past Surgical History:  Procedure Laterality Date  . BRONCHIAL NEEDLE ASPIRATION BIOPSY N/A 12/31/2014   Procedure: BRONCHIAL NEEDLE ASPIRATION BIOPSIES from carina;  Surgeon: Flora Lipps, MD;  Location: [facility] ORS;  Service: Cardiopulmonary;  Laterality: N/A;  . CARDIAC CATHETERIZATION N/A 12/28/2015   Procedure: Left Heart Cath and Cors/Grafts Angiography;  Surgeon: Yolonda Kida, MD;  Location: [place] [facility];   Service: Cardiovascular;  Laterality: N/A;  . CATARACT EXTRACTION W/PHACO Left 08/01/2016   Procedure: CATARACT EXTRACTION PHACO AND INTRAOCULAR LENS PLACEMENT ([place]) Left;  Surgeon: Leandrew Koyanagi, MD;  Location: [place];  Service: Ophthalmology;  Laterality: Left;  . CORONARY ANGIOPLASTY WITH STENT PLACEMENT    . CORONARY ARTERY BYPASS GRAFT    . ENDOBRONCHIAL ULTRASOUND N/A 12/31/2014   Procedure: ENDOBRONCHIAL ULTRASOUND;  Surgeon: Flora Lipps, MD;  Location: [facility] ORS;  Service: Cardiopulmonary;  Laterality: N/A;  . ESOPHAGOGASTRODUODENOSCOPY (EGD) WITH PROPOFOL N/A 06/20/2015   Procedure: ESOPHAGOGASTRODUODENOSCOPY (EGD) WITH PROPOFOL;  Surgeon: Lollie Sails, MD;  Location: [facility] ENDOSCOPY;  Service: Endoscopy;  Laterality: N/A;    SOCIAL HISTORY: lives in [place]; alone. 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  Social History   Socioeconomic History  . Marital status: Widowed    Spouse name: Not on file  . Number of children: 2  . Years of education: college  . Highest education level: Some college, no degree  Occupational History  . Occupation: retired  Scientific laboratory technician  . Financial resource strain: Not hard at all  . Food insecurity:    Worry: Never true    Inability: Never true  . Transportation needs:    Medical: No    Non-medical: No  Tobacco Use  . Smoking status: Former Smoker    Last attempt to quit: 06/27/1977    Years since quitting: 40.5  . Smokeless tobacco: Current User    Types: Chew  Substance and Sexual Activity  . Alcohol use: Yes    Comment: occational almost rare  once a year  . Drug use: No  . Sexual activity: Never    Birth control/protection: Abstinence  Lifestyle  . Physical activity:    Days per week: 7 days    Minutes per session: 30 min  . Stress: Not at all  Relationships  . Social connections:    Talks on phone: More than three times a week    Gets together: More than three times a week    Attends religious service: More than 4 times per  year    Active member of club or organization: No    Attends meetings of clubs or organizations: Never    Relationship status: Widowed  . Intimate partner violence:    Fear of current or ex partner: Patient refused    Emotionally abused: Patient refused    Physically abused: Patient refused    Forced sexual activity: Patient refused  Other Topics Concern  . Not on file  Social History Narrative  . Not on file    FAMILY HISTORY: Family History  Problem Relation Age of Onset  . CAD Mother   . Colon cancer Father     ALLERGIES:  is allergic to 5ht3 receptor antagonists; diphenhydramine hcl; escitalopram; maxidex [dexamethasone]; prednisone; serotonin; vytorin [ezetimibe-simvastatin]; and zocor [simvastatin].  MEDICATIONS:  Current Outpatient Medications  Medication Sig Dispense Refill  . acetaminophen (TYLENOL) 500 MG tablet Take 500 mg by mouth every 6 (six) hours as needed for mild pain or moderate pain.    Marland Kitchen aspirin EC 81 MG tablet Take 81 mg by mouth daily.    Marland Kitchen atorvastatin (LIPITOR) 40 MG tablet Take 1 tablet (40 mg total) by mouth daily at 6 PM. 30 tablet 0  . digoxin (LANOXIN) 0.125 MG tablet Take 0.0625 mg by mouth daily.    Marland Kitchen docusate sodium (COLACE) 100 MG capsule Take 100 mg by mouth 2 (two) times daily.    . famotidine (PEPCID) 20 MG tablet Take 1 tablet (20 mg total) by mouth 2 (two) times daily. 30 tablet 0  . ferrous sulfate 325 (65 FE) MG tablet Take 1 tablet (325 mg total) by mouth daily. (Patient taking differently: Take 325 mg by mouth daily with breakfast. ) 30 tablet 3  . furosemide (LASIX) 20 MG tablet Take 20 mg by mouth as needed. For weight gain greater than 2 pounds in 24 hours or greater than 5 pounds in one week    . metoprolol tartrate (LOPRESSOR) 12.5 mg TABS tablet Take 12.5 mg by mouth 2 (two) times daily.    . nitroGLYCERIN (NITROSTAT) 0.4 MG SL tablet Place 0.4 mg under the tongue every 5 (five) minutes as needed for chest pain.    . pantoprazole  (PROTONIX) 40 MG tablet Take 40 mg by mouth 2 (two) times daily before a meal.     . ranolazine (RANEXA) 500 MG 12 hr tablet Take 1 tablet (500 mg total) by mouth 2 (two) times daily. 60 tablet 0  . sucralfate (CARAFATE) 1 G tablet Take 1 g by mouth 2 (two) times daily.     . tamsulosin (FLOMAX) 0.4 MG CAPS capsule Take 0.4 mg by mouth daily after supper.     . vitamin C (VITAMIN C) 250 MG tablet Take 1 tablet (250 mg total) by mouth daily. 30 tablet 0   No current facility-administered medications for this visit.       Marland Kitchen  PHYSICAL EXAMINATION: ECOG PERFORMANCE STATUS: 0 - Asymptomatic  Vitals:   12/24/17 0945  BP: 117/70  Pulse: 81  Temp: (!) 97.2 F (36.2 C)   Filed Weights   12/24/17 0943  Weight: 212 lb 8.4 oz (96.4 kg)    GENERAL: Well-nourished well-developed; Alert, no distress and comfortable.   Accompanied by family.He is walking with a rolling walker. HEENT-withi is an outpatientn normal limits. Oral mucosa moist no oral ulcerations. No thrush. Chest decreased breath sounds at bases. No visible crackles. Heart regular and no murmurs. 1+ bilateral lower extremity swelling  Abdomen soft nontender nondistended. No hepatomegaly. Bowel sounds present. Neurologic no deficits.   LABORATORY DATA:  I have reviewed the data as listed Lab Results  Component Value Date   WBC 5.8 12/19/2017   HGB 10.5 (L) 12/19/2017   HCT 30.7 (L) 12/19/2017   MCV 101.3 (H) 12/19/2017   PLT 110 (L) 12/19/2017   Recent Labs    02/25/17 0909 06/24/17 0806 12/19/17 0808  NA 139 141 138  K 4.4 4.4 4.5  CL 101 110 106  CO2 23 22 20*  GLUCOSE 112* 102* 102*  BUN 22* 27* 28*  CREATININE 1.69* 1.48* 1.77*  CALCIUM 9.8 9.2 8.9  GFRNONAA 36* 42* 34*  GFRAA 41* 48* 39*  PROT 8.1 8.2* 8.1  ALBUMIN 4.7 4.7 4.5  AST 30 24 26   ALT 17 15* 16  ALKPHOS 63 69 67  BILITOT 1.1 1.3* 0.5   ASSESSMENT & PLAN:   Iron deficiency anemia due to chronic blood loss # Anemia mild hemoglobin 11-12  [at least since 2012]. Likely secondary to CKD-III/iron deficiency [creatinine-1./ Ferritin 114 saturation 35%.] vs ? MDS [no Bmbx].   # Today hemoglobin is 10.5 STABLE. Continue by mouth iron every other day [secondary to GI intolerance].  Iron saturation 35%.  Hold off any IV infusion.  #Mild intermittent thrombocytopenia platelets-110 today; ITP versus MDS.  Asymptomatic monitor for now. Slightly worse.   # CKD Stage III- creat 1.7/ slightly worse. Recommend increase fluid intake.   #The above plan of care was discussed with the patient and his daughter in detail.  # follow up in 6 months/labs-cbc/bmp/iron studies/ferritin- few days prior/possible venofer.       Cammie Sickle, MD 12/31/2017 5:28 PM

## 2018-01-08 DIAGNOSIS — L97521 Non-pressure chronic ulcer of other part of left foot limited to breakdown of skin: Secondary | ICD-10-CM | POA: Diagnosis not present

## 2018-01-08 DIAGNOSIS — G609 Hereditary and idiopathic neuropathy, unspecified: Secondary | ICD-10-CM | POA: Diagnosis not present

## 2018-01-22 DIAGNOSIS — L97521 Non-pressure chronic ulcer of other part of left foot limited to breakdown of skin: Secondary | ICD-10-CM | POA: Diagnosis not present

## 2018-01-22 DIAGNOSIS — L97511 Non-pressure chronic ulcer of other part of right foot limited to breakdown of skin: Secondary | ICD-10-CM | POA: Diagnosis not present

## 2018-02-05 DIAGNOSIS — L97512 Non-pressure chronic ulcer of other part of right foot with fat layer exposed: Secondary | ICD-10-CM | POA: Diagnosis not present

## 2018-02-05 DIAGNOSIS — L97521 Non-pressure chronic ulcer of other part of left foot limited to breakdown of skin: Secondary | ICD-10-CM | POA: Diagnosis not present

## 2018-02-05 DIAGNOSIS — G609 Hereditary and idiopathic neuropathy, unspecified: Secondary | ICD-10-CM | POA: Diagnosis not present

## 2018-02-26 DIAGNOSIS — L97521 Non-pressure chronic ulcer of other part of left foot limited to breakdown of skin: Secondary | ICD-10-CM | POA: Diagnosis not present

## 2018-02-26 DIAGNOSIS — L97512 Non-pressure chronic ulcer of other part of right foot with fat layer exposed: Secondary | ICD-10-CM | POA: Diagnosis not present

## 2018-03-06 DIAGNOSIS — Z23 Encounter for immunization: Secondary | ICD-10-CM | POA: Diagnosis not present

## 2018-03-19 DIAGNOSIS — L97511 Non-pressure chronic ulcer of other part of right foot limited to breakdown of skin: Secondary | ICD-10-CM | POA: Diagnosis not present

## 2018-03-19 DIAGNOSIS — G609 Hereditary and idiopathic neuropathy, unspecified: Secondary | ICD-10-CM | POA: Diagnosis not present

## 2018-03-19 DIAGNOSIS — L97522 Non-pressure chronic ulcer of other part of left foot with fat layer exposed: Secondary | ICD-10-CM | POA: Diagnosis not present

## 2018-03-19 DIAGNOSIS — L03116 Cellulitis of left lower limb: Secondary | ICD-10-CM | POA: Diagnosis not present

## 2018-03-26 DIAGNOSIS — L97522 Non-pressure chronic ulcer of other part of left foot with fat layer exposed: Secondary | ICD-10-CM | POA: Diagnosis not present

## 2018-03-26 DIAGNOSIS — L97511 Non-pressure chronic ulcer of other part of right foot limited to breakdown of skin: Secondary | ICD-10-CM | POA: Diagnosis not present

## 2018-03-26 DIAGNOSIS — L02612 Cutaneous abscess of left foot: Secondary | ICD-10-CM | POA: Diagnosis not present

## 2018-04-01 DIAGNOSIS — Z125 Encounter for screening for malignant neoplasm of prostate: Secondary | ICD-10-CM | POA: Diagnosis not present

## 2018-04-01 DIAGNOSIS — I5022 Chronic systolic (congestive) heart failure: Secondary | ICD-10-CM | POA: Diagnosis not present

## 2018-04-01 DIAGNOSIS — D500 Iron deficiency anemia secondary to blood loss (chronic): Secondary | ICD-10-CM | POA: Diagnosis not present

## 2018-04-01 DIAGNOSIS — E782 Mixed hyperlipidemia: Secondary | ICD-10-CM | POA: Diagnosis not present

## 2018-04-08 DIAGNOSIS — I5022 Chronic systolic (congestive) heart failure: Secondary | ICD-10-CM | POA: Diagnosis not present

## 2018-04-08 DIAGNOSIS — I255 Ischemic cardiomyopathy: Secondary | ICD-10-CM | POA: Diagnosis not present

## 2018-04-08 DIAGNOSIS — E538 Deficiency of other specified B group vitamins: Secondary | ICD-10-CM | POA: Diagnosis not present

## 2018-04-08 DIAGNOSIS — D500 Iron deficiency anemia secondary to blood loss (chronic): Secondary | ICD-10-CM | POA: Diagnosis not present

## 2018-04-16 DIAGNOSIS — L97511 Non-pressure chronic ulcer of other part of right foot limited to breakdown of skin: Secondary | ICD-10-CM | POA: Diagnosis not present

## 2018-04-30 DIAGNOSIS — L97522 Non-pressure chronic ulcer of other part of left foot with fat layer exposed: Secondary | ICD-10-CM | POA: Diagnosis not present

## 2018-04-30 DIAGNOSIS — L97511 Non-pressure chronic ulcer of other part of right foot limited to breakdown of skin: Secondary | ICD-10-CM | POA: Diagnosis not present

## 2018-05-01 ENCOUNTER — Telehealth: Payer: Self-pay | Admitting: Family

## 2018-05-01 NOTE — Telephone Encounter (Signed)
Patient called to say that he's gained ~ 3 pounds over the last 2 days and wanted to make sure that the medication bottle he thought was his diuretic was correct before he took it. No edema or shortness of breath.   04/29/18 weight was 206.0 05/01/18 weight was 209.6  He has furosemide 40mg  listed that he can take as needed. Advised him that he could take the furosemide today and to call us back if weight continues to rise as we would need to schedule him an appointment.

## 2018-05-21 DIAGNOSIS — G609 Hereditary and idiopathic neuropathy, unspecified: Secondary | ICD-10-CM | POA: Diagnosis not present

## 2018-05-21 DIAGNOSIS — L97511 Non-pressure chronic ulcer of other part of right foot limited to breakdown of skin: Secondary | ICD-10-CM | POA: Diagnosis not present

## 2018-05-21 DIAGNOSIS — L97522 Non-pressure chronic ulcer of other part of left foot with fat layer exposed: Secondary | ICD-10-CM | POA: Diagnosis not present

## 2018-06-18 ENCOUNTER — Other Ambulatory Visit: Payer: Self-pay

## 2018-06-18 DIAGNOSIS — D500 Iron deficiency anemia secondary to blood loss (chronic): Secondary | ICD-10-CM

## 2018-06-23 DIAGNOSIS — L97512 Non-pressure chronic ulcer of other part of right foot with fat layer exposed: Secondary | ICD-10-CM | POA: Diagnosis not present

## 2018-06-23 DIAGNOSIS — L97522 Non-pressure chronic ulcer of other part of left foot with fat layer exposed: Secondary | ICD-10-CM | POA: Diagnosis not present

## 2018-06-24 ENCOUNTER — Inpatient Hospital Stay: Payer: PPO | Attending: Internal Medicine

## 2018-06-24 DIAGNOSIS — D649 Anemia, unspecified: Secondary | ICD-10-CM | POA: Insufficient documentation

## 2018-06-24 DIAGNOSIS — I429 Cardiomyopathy, unspecified: Secondary | ICD-10-CM | POA: Diagnosis not present

## 2018-06-24 DIAGNOSIS — N183 Chronic kidney disease, stage 3 (moderate): Secondary | ICD-10-CM | POA: Diagnosis not present

## 2018-06-24 DIAGNOSIS — I509 Heart failure, unspecified: Secondary | ICD-10-CM | POA: Diagnosis not present

## 2018-06-24 DIAGNOSIS — I252 Old myocardial infarction: Secondary | ICD-10-CM | POA: Diagnosis not present

## 2018-06-24 DIAGNOSIS — I130 Hypertensive heart and chronic kidney disease with heart failure and stage 1 through stage 4 chronic kidney disease, or unspecified chronic kidney disease: Secondary | ICD-10-CM | POA: Insufficient documentation

## 2018-06-24 DIAGNOSIS — D696 Thrombocytopenia, unspecified: Secondary | ICD-10-CM | POA: Insufficient documentation

## 2018-06-24 DIAGNOSIS — Z79899 Other long term (current) drug therapy: Secondary | ICD-10-CM | POA: Diagnosis not present

## 2018-06-24 DIAGNOSIS — I4891 Unspecified atrial fibrillation: Secondary | ICD-10-CM | POA: Insufficient documentation

## 2018-06-24 DIAGNOSIS — Z87891 Personal history of nicotine dependence: Secondary | ICD-10-CM | POA: Insufficient documentation

## 2018-06-24 DIAGNOSIS — Z7982 Long term (current) use of aspirin: Secondary | ICD-10-CM | POA: Diagnosis not present

## 2018-06-24 DIAGNOSIS — D500 Iron deficiency anemia secondary to blood loss (chronic): Secondary | ICD-10-CM

## 2018-06-24 DIAGNOSIS — I2510 Atherosclerotic heart disease of native coronary artery without angina pectoris: Secondary | ICD-10-CM | POA: Diagnosis not present

## 2018-06-24 LAB — BASIC METABOLIC PANEL
Anion gap: 10 (ref 5–15)
BUN: 25 mg/dL — ABNORMAL HIGH (ref 8–23)
CO2: 22 mmol/L (ref 22–32)
Calcium: 9.3 mg/dL (ref 8.9–10.3)
Chloride: 106 mmol/L (ref 98–111)
Creatinine, Ser: 1.62 mg/dL — ABNORMAL HIGH (ref 0.61–1.24)
GFR calc Af Amer: 44 mL/min — ABNORMAL LOW (ref >60)
GFR calc non Af Amer: 38 mL/min — ABNORMAL LOW (ref >60)
Glucose, Bld: 110 mg/dL — ABNORMAL HIGH (ref 70–99)
Potassium: 4.7 mmol/L (ref 3.5–5.1)
Sodium: 138 mmol/L (ref 135–145)

## 2018-06-24 LAB — CBC WITH DIFFERENTIAL/PLATELET
Abs Immature Granulocytes: 0.03 10*3/uL (ref 0.00–0.07)
Basophils Absolute: 0 10*3/uL (ref 0.0–0.1)
Basophils Relative: 1 %
Eosinophils Absolute: 0.1 10*3/uL (ref 0.0–0.5)
Eosinophils Relative: 2 %
HCT: 29.1 % — ABNORMAL LOW (ref 39.0–52.0)
Hemoglobin: 9.3 g/dL — ABNORMAL LOW (ref 13.0–17.0)
Immature Granulocytes: 1 %
Lymphocytes Relative: 43 %
Lymphs Abs: 2.4 10*3/uL (ref 0.7–4.0)
MCH: 32.9 pg (ref 26.0–34.0)
MCHC: 32 g/dL (ref 30.0–36.0)
MCV: 102.8 fL — ABNORMAL HIGH (ref 80.0–100.0)
Monocytes Absolute: 1.1 10*3/uL — ABNORMAL HIGH (ref 0.1–1.0)
Monocytes Relative: 21 %
Neutro Abs: 1.7 10*3/uL (ref 1.7–7.7)
Neutrophils Relative %: 32 %
Platelets: 169 10*3/uL (ref 150–400)
RBC: 2.83 MIL/uL — ABNORMAL LOW (ref 4.22–5.81)
RDW: 16 % — ABNORMAL HIGH (ref 11.5–15.5)
WBC: 5.3 10*3/uL (ref 4.0–10.5)
nRBC: 0 % (ref 0.0–0.2)

## 2018-06-24 LAB — IRON AND TIBC
Iron: 70 ug/dL (ref 45–182)
Saturation Ratios: 22 % (ref 17.9–39.5)
TIBC: 318 ug/dL (ref 250–450)
UIBC: 248 ug/dL

## 2018-06-24 LAB — FERRITIN: Ferritin: 126 ng/mL (ref 24–336)

## 2018-07-01 ENCOUNTER — Other Ambulatory Visit: Payer: Self-pay

## 2018-07-01 ENCOUNTER — Inpatient Hospital Stay (HOSPITAL_BASED_OUTPATIENT_CLINIC_OR_DEPARTMENT_OTHER): Payer: PPO | Admitting: Internal Medicine

## 2018-07-01 ENCOUNTER — Ambulatory Visit: Payer: PPO

## 2018-07-01 ENCOUNTER — Ambulatory Visit: Payer: PPO | Admitting: Internal Medicine

## 2018-07-01 ENCOUNTER — Inpatient Hospital Stay: Payer: PPO

## 2018-07-01 VITALS — BP 150/75 | HR 75 | Temp 97.9°F | Resp 20 | Wt 210.0 lb

## 2018-07-01 DIAGNOSIS — D649 Anemia, unspecified: Secondary | ICD-10-CM

## 2018-07-01 DIAGNOSIS — D696 Thrombocytopenia, unspecified: Secondary | ICD-10-CM | POA: Diagnosis not present

## 2018-07-01 DIAGNOSIS — N183 Chronic kidney disease, stage 3 (moderate): Secondary | ICD-10-CM

## 2018-07-01 DIAGNOSIS — D500 Iron deficiency anemia secondary to blood loss (chronic): Secondary | ICD-10-CM

## 2018-07-01 DIAGNOSIS — D539 Nutritional anemia, unspecified: Secondary | ICD-10-CM

## 2018-07-01 NOTE — Progress Notes (Signed)
[place] NOTE  Patient Care Team: Rusty Aus, MD as PCP - General (Internal Medicine) Alisa Graff, FNP as Nurse Practitioner (Family Medicine) Ubaldo Glassing Javier Docker, MD as Consulting Physician (Cardiology) Erby Pian, MD as Referring Physician (Specialist)  CHIEF COMPLAINTS/PURPOSE OF CONSULTATION:   # 2012- Anemia 11-12- sec to CKD/IDA? MDS [no BMBx] [March 2017-sat-11%; ferritin-42] [Feb 2017- EGD/colo-July 2015 Dr.Skulskie];April 2017-PO Iron PO.   # Intermittent thrombocytopenia 120s-140s [Dec 2016-CT- neg cirrhosis/splenomegaly]  # CKD [creatinine 1.2- 1.4]; CAD/CHF  HISTORY OF PRESENTING ILLNESS:  Andres Gutierrez 85 y.o.  male history of above history of chronic anemia and intermittent thrombocytopenia is here for follow-up.  Patient admits to intermittent fatigue.  Denies any blood in stools or black or stools.  Continues to take iron pills every other day.  No constipation   Review of Systems  Constitutional: Positive for malaise/fatigue. Negative for chills, diaphoresis, fever and weight loss.  HENT: Negative for nosebleeds and sore throat.   Eyes: Negative for double vision.  Respiratory: Negative for cough, hemoptysis, sputum production, shortness of breath and wheezing.   Cardiovascular: Negative for chest pain, palpitations, orthopnea and leg swelling.  Gastrointestinal: Negative for abdominal pain, blood in stool, constipation, diarrhea, heartburn, melena, nausea and vomiting.  Genitourinary: Negative for dysuria, frequency and urgency.  Musculoskeletal: Positive for back pain and joint pain.  Skin: Negative.  Negative for itching and rash.  Neurological: Negative for dizziness, tingling, focal weakness, weakness and headaches.  Endo/Heme/Allergies: Does not bruise/bleed easily.  Psychiatric/Behavioral: Negative for depression. The patient is not nervous/anxious and does not have insomnia.      MEDICAL HISTORY:  Past Medical  History:  Diagnosis Date  . Anemia   . Anxiety   . Atrial fibrillation ([place])   . Cardiomyopathy ([place])   . CHF (congestive heart failure) ([place])   . Chronic kidney disease    kidney stones  . Coronary artery disease   . Cough   . Dysrhythmia   . Hypertension   . Lymphadenopathy, hilar   . MI (myocardial infarction) ([place])    x 2  . Wheezing     SURGICAL HISTORY: Past Surgical History:  Procedure Laterality Date  . BRONCHIAL NEEDLE ASPIRATION BIOPSY N/A 12/31/2014   Procedure: BRONCHIAL NEEDLE ASPIRATION BIOPSIES from carina;  Surgeon: Flora Lipps, MD;  Location: [facility] OR[facility];  Service: Cardiopulmonary;  Laterality: N/A;  . CARDIAC CATHETERIZATION N/A 12/28/2015   Procedure: Left Heart Cath and Cors/Grafts Angiography;  Surgeon: Yolonda Kida, MD;  Location: [place] [facility];  Service: Cardiovascular;  Laterality: N/A;  . CATARACT EXTRACTION W/PHACO Left 08/01/2016   Procedure: CATARACT EXTRACTION PHACO AND INTRAOCULAR LENS PLACEMENT ([place]) Left;  Surgeon: Leandrew Koyanagi, MD;  Location: [place];  Service: Ophthalmology;  Laterality: Left;  . CORONARY ANGIOPLASTY WITH STENT PLACEMENT    . CORONARY ARTERY BYPASS GRAFT    . ENDOBRONCHIAL ULTRASOUND N/A 12/31/2014   Procedure: ENDOBRONCHIAL ULTRASOUND;  Surgeon: Flora Lipps, MD;  Location: [facility];  Service: Cardiopulmonary;  Laterality: N/A;  . ESOPHAGOGASTRODUODENOSCOPY (EGD) WITH PROPOFOL N/A 06/20/2015   Procedure: ESOPHAGOGASTRODUODENOSCOPY (EGD) WITH PROPOFOL;  Surgeon: Lollie Sails, MD;  Location: [facility] END[facility];  Service: Endoscopy;  Laterality: N/A;    SOCIAL HISTORY: lives in [place]; alone. 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  Social History   Socioeconomic History  . Marital status: Widowed    Spouse name: Not on file  . Number of children: 2  . Years of education: college  .  Highest education level: Some college, no degree  Occupational History  . Occupation: retired  Scientific laboratory technician  . Financial resource  strain: Not hard at all  . Food insecurity:    Worry: Never true    Inability: Never true  . Transportation needs:    Medical: No    Non-medical: No  Tobacco Use  . Smoking status: Former Smoker    Last attempt to quit: 06/27/1977    Years since quitting: 41.0  . Smokeless tobacco: Current User    Types: Chew  Substance and Sexual Activity  . Alcohol use: Yes    Comment: occational almost rare once a year  . Drug use: No  . Sexual activity: Never    Birth control/protection: Abstinence  Lifestyle  . Physical activity:    Days per week: 7 days    Minutes per session: 30 min  . Stress: Not at all  Relationships  . Social connections:    Talks on phone: More than three times a week    Gets together: More than three times a week    Attends religious service: More than 4 times per year    Active member of club or organization: No    Attends meetings of clubs or organizations: Never    Relationship status: Widowed  . Intimate partner violence:    Fear of current or ex partner: Patient refused    Emotionally abused: Patient refused    Physically abused: Patient refused    Forced sexual activity: Patient refused  Other Topics Concern  . Not on file  Social History Narrative  . Not on file    FAMILY HISTORY: Family History  Problem Relation Age of Onset  . CAD Mother   . Colon cancer Father     ALLERGIES:  is allergic to 5ht3 receptor antagonists; diphenhydramine hcl; escitalopram; maxidex [dexamethasone]; prednisone; serotonin; vytorin [ezetimibe-simvastatin]; zocor [simvastatin]; and diltiazem.  MEDICATIONS:  Current Outpatient Medications  Medication Sig Dispense Refill  . acetaminophen (TYLENOL) 500 MG tablet Take 500 mg by mouth every 6 (six) hours as needed for mild pain or moderate pain.    Marland Kitchen aspirin EC 81 MG tablet Take 81 mg by mouth daily.    Marland Kitchen atorvastatin (LIPITOR) 40 MG tablet Take 1 tablet (40 mg total) by mouth daily at 6 PM. 30 tablet 0  . digoxin  (LANOXIN) 0.125 MG tablet Take 0.0625 mg by mouth daily.    . ferrous sulfate 325 (65 FE) MG tablet Take 1 tablet (325 mg total) by mouth daily. (Patient taking differently: Take 325 mg by mouth daily with breakfast. ) 30 tablet 3  . metoprolol tartrate (LOPRESSOR) 12.5 mg TABS tablet Take 12.5 mg by mouth 2 (two) times daily.    . pantoprazole (PROTONIX) 40 MG tablet Take 40 mg by mouth 2 (two) times daily before a meal.     . ranolazine (RANEXA) 500 MG 12 hr tablet Take 1 tablet (500 mg total) by mouth 2 (two) times daily. 60 tablet 0  . sucralfate (CARAFATE) 1 G tablet Take 1 g by mouth 2 (two) times daily.     . tamsulosin (FLOMAX) 0.4 MG CAPS capsule Take 0.4 mg by mouth daily after supper.     . vitamin C (VITAMIN C) 250 MG tablet Take 1 tablet (250 mg total) by mouth daily. 30 tablet 0  . docusate sodium (COLACE) 100 MG capsule Take 100 mg by mouth daily as needed.     . furosemide (LASIX) 20 MG  tablet Take 40 mg by mouth as needed. For weight gain greater than 2 pounds in 24 hours or greater than 5 pounds in one week    . nitroGLYCERIN (NITROSTAT) 0.4 MG SL tablet Place 0.4 mg under the tongue every 5 (five) minutes as needed for chest pain.     No current facility-administered medications for this visit.       Marland Kitchen  PHYSICAL EXAMINATION: ECOG PERFORMANCE STATUS: 0 - Asymptomatic  Vitals:   07/01/18 1130  BP: (!) 150/75  Pulse: 75  Resp: 20  Temp: 97.9 F (36.6 C)   Filed Weights   07/01/18 1128  Weight: 210 lb (95.3 kg)    Physical Exam  Constitutional: He is oriented to person, place, and time.  Elderly Caucasian male patient.  Accompanied by his granddaughter.  Walking by himself.  HENT:  Head: Normocephalic and atraumatic.  Mouth/Throat: Oropharynx is clear and moist. No oropharyngeal exudate.  Eyes: Pupils are equal, round, and reactive to light.  Neck: Normal range of motion. Neck supple.  Cardiovascular: Normal rate and regular rhythm.  Pulmonary/Chest: Breath  sounds normal. No respiratory distress. He has no wheezes.  Abdominal: Soft. Bowel sounds are normal. He exhibits no distension and no mass. There is no abdominal tenderness. There is no rebound and no guarding.  Musculoskeletal: Normal range of motion.        General: No tenderness or edema.  Neurological: He is alert and oriented to person, place, and time.  Skin: Skin is warm.  Psychiatric: Affect normal.   .   LABORATORY DATA:  I have reviewed the data as listed Lab Results  Component Value Date   WBC 5.3 06/24/2018   HGB 9.3 (L) 06/24/2018   HCT 29.1 (L) 06/24/2018   MCV 102.8 (H) 06/24/2018   PLT 169 06/24/2018   Recent Labs    12/19/17 0808 06/24/18 0946  NA 138 138  K 4.5 4.7  CL 106 106  CO2 20* 22  GLUCOSE 102* 110*  BUN 28* 25*  CREATININE 1.77* 1.62*  CALCIUM 8.9 9.3  GFRNONAA 34* 38*  GFRAA 39* 44*  PROT 8.1  --   ALBUMIN 4.5  --   AST 26  --   ALT 16  --   ALKPHOS 67  --   BILITOT 0.5  --    ASSESSMENT & PLAN:   Iron deficiency anemia due to chronic blood loss # Anemia mild hemoglobin likely since 2012.  However hemoglobin today is 9.3 slowly trending down macrocytic.  Currently on p.o. iron.  Iron studies show saturation of 20%-no evidence of iron deficiency.  #Had a long discussion with patient and family regarding the etiology of anemia including-CKD vs. low-grade myelodysplastic syndrome.  #Discussed the bone marrow biopsy procedure in detail.  Discussed the potential complication including but not limited to bleeding pain infection.    After lengthy discussion weighing risk versus benefits patient agrees to proceed with procedure as discussed above.   Discussed use of erythropoietin stimulating agents like Aranesp to stimulate the bone marrow.  Discussed the potential issues with erythropoietin estimating agents-given the risk of stroke thromboembolic events/elevated blood pressure.  However, most of the serious events did not happen when the  goal hematocrit is 33/hemoglobin 30.  I also discussed the use of IV iron infusion; and the potential infusion reactions.  # Patient follow-up with me in approximately 7-10 days to discuss results of the bone marrow biopsy.  #Mild intermittent thrombocytopenia platelets-110 today; ITP versus MDS.  Today platelets are normal.  # CKD Stage III- creat 1.7 stable # DISPOSITION:  # NO infusion today # Bone marrow in ~ 1week # follow up 1 week post Biopsy/ no labs; Possible aranesp- Dr.B      Cammie Sickle, MD 07/01/2018 2:17 PM

## 2018-07-01 NOTE — Assessment & Plan Note (Addendum)
#  Anemia mild hemoglobin likely since 2012.  However hemoglobin today is 9.3 slowly trending down macrocytic.  Currently on p.o. iron.  Iron studies show saturation of 20%-no evidence of iron deficiency.  #Had a long discussion with patient and family regarding the etiology of anemia including-CKD vs. low-grade myelodysplastic syndrome.  #Discussed the bone marrow biopsy procedure in detail.  Discussed the potential complication including but not limited to bleeding pain infection.    After lengthy discussion weighing risk versus benefits patient agrees to proceed with procedure as discussed above.   Discussed use of erythropoietin stimulating agents like Aranesp to stimulate the bone marrow.  Discussed the potential issues with erythropoietin estimating agents-given the risk of stroke thromboembolic events/elevated blood pressure.  However, most of the serious events did not happen when the goal hematocrit is 33/hemoglobin 30.  I also discussed the use of IV iron infusion; and the potential infusion reactions.  # Patient follow-up with me in approximately 7-10 days to discuss results of the bone marrow biopsy.  #Mild intermittent thrombocytopenia platelets-110 today; ITP versus MDS.  Today platelets are normal.  # CKD Stage III- creat 1.7 stable # DISPOSITION:  # NO infusion today # Bone marrow in ~ 1week # follow up 1 week post Biopsy/ no labs; Possible aranesp- Dr.B

## 2018-07-04 ENCOUNTER — Telehealth: Payer: Self-pay

## 2018-07-04 NOTE — Telephone Encounter (Signed)
I received a call from [facility] in centralized scheduling stating that patient has a bone marrow bx scheduled for next Wednesday. When the nurse from centralized scheduling called to go over appt information, patient states that he no longer wants to go through with bone marrow bx, and would like to speak with Dr. B    Per Dr. Jacinto Reap, have patient follow up in the office one day next week to discuss. No labs needed at this appt. I attempted to contact patient to discuss this appt but I was unable to reach. I left a voicemail for patient to return phone call. I will also route to scheduling team to schedule this appt and let patient know. Thank you!

## 2018-07-09 ENCOUNTER — Ambulatory Visit: Admission: RE | Admit: 2018-07-09 | Payer: PPO | Source: Ambulatory Visit

## 2018-07-15 ENCOUNTER — Inpatient Hospital Stay: Payer: PPO | Attending: Internal Medicine | Admitting: Internal Medicine

## 2018-07-15 ENCOUNTER — Encounter: Payer: Self-pay | Admitting: Internal Medicine

## 2018-07-15 VITALS — BP 171/72 | HR 69 | Temp 97.6°F | Resp 16 | Wt 212.4 lb

## 2018-07-15 DIAGNOSIS — N183 Chronic kidney disease, stage 3 (moderate): Secondary | ICD-10-CM | POA: Insufficient documentation

## 2018-07-15 DIAGNOSIS — F419 Anxiety disorder, unspecified: Secondary | ICD-10-CM | POA: Insufficient documentation

## 2018-07-15 DIAGNOSIS — Z79899 Other long term (current) drug therapy: Secondary | ICD-10-CM | POA: Insufficient documentation

## 2018-07-15 DIAGNOSIS — D500 Iron deficiency anemia secondary to blood loss (chronic): Secondary | ICD-10-CM | POA: Insufficient documentation

## 2018-07-15 DIAGNOSIS — D696 Thrombocytopenia, unspecified: Secondary | ICD-10-CM | POA: Diagnosis not present

## 2018-07-15 DIAGNOSIS — R634 Abnormal weight loss: Secondary | ICD-10-CM | POA: Insufficient documentation

## 2018-07-15 DIAGNOSIS — I509 Heart failure, unspecified: Secondary | ICD-10-CM | POA: Diagnosis not present

## 2018-07-15 DIAGNOSIS — I252 Old myocardial infarction: Secondary | ICD-10-CM | POA: Insufficient documentation

## 2018-07-15 DIAGNOSIS — Z7982 Long term (current) use of aspirin: Secondary | ICD-10-CM | POA: Diagnosis not present

## 2018-07-15 DIAGNOSIS — I129 Hypertensive chronic kidney disease with stage 1 through stage 4 chronic kidney disease, or unspecified chronic kidney disease: Secondary | ICD-10-CM | POA: Insufficient documentation

## 2018-07-15 DIAGNOSIS — Z87442 Personal history of urinary calculi: Secondary | ICD-10-CM | POA: Insufficient documentation

## 2018-07-15 DIAGNOSIS — Z87891 Personal history of nicotine dependence: Secondary | ICD-10-CM | POA: Diagnosis not present

## 2018-07-15 DIAGNOSIS — I4891 Unspecified atrial fibrillation: Secondary | ICD-10-CM | POA: Insufficient documentation

## 2018-07-15 DIAGNOSIS — I2510 Atherosclerotic heart disease of native coronary artery without angina pectoris: Secondary | ICD-10-CM | POA: Diagnosis not present

## 2018-07-15 NOTE — Progress Notes (Signed)
[place] NOTE  Patient Care Team: Rusty Aus, MD as PCP - General (Internal Medicine) Alisa Graff, FNP as Nurse Practitioner (Family Medicine) Ubaldo Glassing Javier Docker, MD as Consulting Physician (Cardiology) Erby Pian, MD as Referring Physician (Specialist)  CHIEF COMPLAINTS/PURPOSE OF CONSULTATION:   # 2012- Anemia 11-12- sec to CKD/IDA? MDS [no BMBx] [March 2017-sat-11%; ferritin-42] [Feb 2017- EGD/colo-July 2015 Dr.Skulskie];April 2017-PO Iron PO.   # Intermittent thrombocytopenia 120s-140s [Dec 2016-CT- neg cirrhosis/splenomegaly]  # CKD [creatinine 1.2- 1.4]; CAD/CHF  HISTORY OF PRESENTING ILLNESS:  Andres Gutierrez 85 y.o.  male history of above history of chronic anemia and intermittent thrombocytopenia is here for follow-up.  At the last visit patient agreed to proceed with bone marrow biopsy for further evaluation of his chronic anemia.  However after going home patient had significant concerns regarding biopsy; and hence decided to come back to discuss further.  He continues to admit of worsening fatigue.  Otherwise no blood in stools or black or stools.  Review of Systems  Constitutional: Positive for malaise/fatigue. Negative for chills, diaphoresis, fever and weight loss.  HENT: Negative for nosebleeds and sore throat.   Eyes: Negative for double vision.  Respiratory: Negative for cough, hemoptysis, sputum production, shortness of breath and wheezing.   Cardiovascular: Negative for chest pain, palpitations, orthopnea and leg swelling.  Gastrointestinal: Negative for abdominal pain, blood in stool, constipation, diarrhea, heartburn, melena, nausea and vomiting.  Genitourinary: Negative for dysuria, frequency and urgency.  Musculoskeletal: Positive for back pain and joint pain.  Skin: Negative.  Negative for itching and rash.  Neurological: Negative for dizziness, tingling, focal weakness, weakness and headaches.  Endo/Heme/Allergies: Does  not bruise/bleed easily.  Psychiatric/Behavioral: Negative for depression. The patient is not nervous/anxious and does not have insomnia.      MEDICAL HISTORY:  Past Medical History:  Diagnosis Date  . Anemia   . Anxiety   . Atrial fibrillation ([place])   . Cardiomyopathy ([place])   . CHF (congestive heart failure) ([place])   . Chronic kidney disease    kidney stones  . Coronary artery disease   . Cough   . Dysrhythmia   . Hypertension   . Lymphadenopathy, hilar   . MI (myocardial infarction) ([place])    x 2  . Wheezing     SURGICAL HISTORY: Past Surgical History:  Procedure Laterality Date  . BRONCHIAL NEEDLE ASPIRATION BIOPSY N/A 12/31/2014   Procedure: BRONCHIAL NEEDLE ASPIRATION BIOPSIES from carina;  Surgeon: Flora Lipps, MD;  Location: [facility] ORS;  Service: Cardiopulmonary;  Laterality: N/A;  . CARDIAC CATHETERIZATION N/A 12/28/2015   Procedure: Left Heart Cath and Cors/Grafts Angiography;  Surgeon: Yolonda Kida, MD;  Location: [place] [facility];  Service: Cardiovascular;  Laterality: N/A;  . CATARACT EXTRACTION W/PHACO Left 08/01/2016   Procedure: CATARACT EXTRACTION PHACO AND INTRAOCULAR LENS PLACEMENT ([place]) Left;  Surgeon: Leandrew Koyanagi, MD;  Location: [place];  Service: Ophthalmology;  Laterality: Left;  . CORONARY ANGIOPLASTY WITH STENT PLACEMENT    . CORONARY ARTERY BYPASS GRAFT    . ENDOBRONCHIAL ULTRASOUND N/A 12/31/2014   Procedure: ENDOBRONCHIAL ULTRASOUND;  Surgeon: Flora Lipps, MD;  Location: [facility] ORS;  Service: Cardiopulmonary;  Laterality: N/A;  . ESOPHAGOGASTRODUODENOSCOPY (EGD) WITH PROPOFOL N/A 06/20/2015   Procedure: ESOPHAGOGASTRODUODENOSCOPY (EGD) WITH PROPOFOL;  Surgeon: Lollie Sails, MD;  Location: [facility] ENDOSCOPY;  Service: Endoscopy;  Laterality: N/A;    SOCIAL HISTORY: lives in [place]; alone. 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  Social History  Socioeconomic History  . Marital status: Widowed    Spouse name: Not on file  . Number of  children: 2  . Years of education: college  . Highest education level: Some college, no degree  Occupational History  . Occupation: retired  Scientific laboratory technician  . Financial resource strain: Not hard at all  . Food insecurity:    Worry: Never true    Inability: Never true  . Transportation needs:    Medical: No    Non-medical: No  Tobacco Use  . Smoking status: Former Smoker    Last attempt to quit: 06/27/1977    Years since quitting: 41.0  . Smokeless tobacco: Current User    Types: Chew  Substance and Sexual Activity  . Alcohol use: Yes    Comment: occational almost rare once a year  . Drug use: No  . Sexual activity: Never    Birth control/protection: Abstinence  Lifestyle  . Physical activity:    Days per week: 7 days    Minutes per session: 30 min  . Stress: Not at all  Relationships  . Social connections:    Talks on phone: More than three times a week    Gets together: More than three times a week    Attends religious service: More than 4 times per year    Active member of club or organization: No    Attends meetings of clubs or organizations: Never    Relationship status: Widowed  . Intimate partner violence:    Fear of current or ex partner: Patient refused    Emotionally abused: Patient refused    Physically abused: Patient refused    Forced sexual activity: Patient refused  Other Topics Concern  . Not on file  Social History Narrative  . Not on file    FAMILY HISTORY: Family History  Problem Relation Age of Onset  . CAD Mother   . Colon cancer Father     ALLERGIES:  is allergic to 5ht3 receptor antagonists; diphenhydramine hcl; escitalopram; maxidex [dexamethasone]; prednisone; serotonin; vytorin [ezetimibe-simvastatin]; zocor [simvastatin]; and diltiazem.  MEDICATIONS:  Current Outpatient Medications  Medication Sig Dispense Refill  . acetaminophen (TYLENOL) 500 MG tablet Take 500 mg by mouth every 6 (six) hours as needed for mild pain or moderate  pain.    Marland Kitchen aspirin EC 81 MG tablet Take 81 mg by mouth daily.    Marland Kitchen atorvastatin (LIPITOR) 40 MG tablet Take 1 tablet (40 mg total) by mouth daily at 6 PM. 30 tablet 0  . digoxin (LANOXIN) 0.125 MG tablet Take 0.0625 mg by mouth daily.    Marland Kitchen docusate sodium (COLACE) 100 MG capsule Take 100 mg by mouth daily as needed.     . ferrous sulfate 325 (65 FE) MG tablet Take 1 tablet (325 mg total) by mouth daily. (Patient taking differently: Take 325 mg by mouth daily with breakfast. ) 30 tablet 3  . furosemide (LASIX) 20 MG tablet Take 40 mg by mouth as needed. For weight gain greater than 2 pounds in 24 hours or greater than 5 pounds in one week    . metoprolol tartrate (LOPRESSOR) 12.5 mg TABS tablet Take 12.5 mg by mouth 2 (two) times daily.    . nitroGLYCERIN (NITROSTAT) 0.4 MG SL tablet Place 0.4 mg under the tongue every 5 (five) minutes as needed for chest pain.    . pantoprazole (PROTONIX) 40 MG tablet Take 40 mg by mouth 2 (two) times daily before a meal.     .  ranolazine (RANEXA) 500 MG 12 hr tablet Take 1 tablet (500 mg total) by mouth 2 (two) times daily. 60 tablet 0  . sucralfate (CARAFATE) 1 G tablet Take 1 g by mouth 2 (two) times daily.     . tamsulosin (FLOMAX) 0.4 MG CAPS capsule Take 0.4 mg by mouth daily after supper.     . vitamin C (VITAMIN C) 250 MG tablet Take 1 tablet (250 mg total) by mouth daily. 30 tablet 0   No current facility-administered medications for this visit.       Marland Kitchen  PHYSICAL EXAMINATION: ECOG PERFORMANCE STATUS: 0 - Asymptomatic  Vitals:   07/15/18 1016  BP: (!) 171/72  Pulse: 69  Resp: 16  Temp: 97.6 F (36.4 C)   Filed Weights   07/15/18 1016  Weight: 212 lb 6.4 oz (96.3 kg)    Physical Exam  Constitutional: He is oriented to person, place, and time.  Elderly Caucasian male patient.  Accompanied by his granddaughter/family.  Walking by himself.  HENT:  Head: Normocephalic and atraumatic.  Mouth/Throat: Oropharynx is clear and moist. No  oropharyngeal exudate.  Eyes: Pupils are equal, round, and reactive to light.  Neck: Normal range of motion. Neck supple.  Cardiovascular: Normal rate and regular rhythm.  Pulmonary/Chest: Breath sounds normal. No respiratory distress. He has no wheezes.  Abdominal: Soft. Bowel sounds are normal. He exhibits no distension and no mass. There is no abdominal tenderness. There is no rebound and no guarding.  Musculoskeletal: Normal range of motion.        General: No tenderness or edema.  Neurological: He is alert and oriented to person, place, and time.  Skin: Skin is warm.  Psychiatric: Affect normal.   .   LABORATORY DATA:  I have reviewed the data as listed Lab Results  Component Value Date   WBC 5.3 06/24/2018   HGB 9.3 (L) 06/24/2018   HCT 29.1 (L) 06/24/2018   MCV 102.8 (H) 06/24/2018   PLT 169 06/24/2018   Recent Labs    12/19/17 0808 06/24/18 0946  NA 138 138  K 4.5 4.7  CL 106 106  CO2 20* 22  GLUCOSE 102* 110*  BUN 28* 25*  CREATININE 1.77* 1.62*  CALCIUM 8.9 9.3  GFRNONAA 34* 38*  GFRAA 39* 44*  PROT 8.1  --   ALBUMIN 4.5  --   AST 26  --   ALT 16  --   ALKPHOS 67  --   BILITOT 0.5  --    ASSESSMENT & PLAN:   Iron deficiency anemia due to chronic blood loss # Anemia mild hemoglobin likely since 2012.  However hemoglobin today is 9.3 slowly trending down macrocytic.  Currently on p.o. iron.  Iron studies show saturation of 20%-no evidence of iron deficiency.  #Had a long discussion with patient and family regarding the etiology of anemia including-CKD vs. low-grade myelodysplastic syndrome.  #Discussed the bone marrow biopsy procedure in detail.  Discussed the potential complication including but not limited to bleeding pain infection.    After lengthy discussion weighing risk versus benefits patient agrees to proceed with procedure as discussed above.   Discussed use of erythropoietin stimulating agents like Aranesp to stimulate the bone marrow.   Discussed the potential issues with erythropoietin estimating agents-given the risk of stroke thromboembolic events/elevated blood pressure.  However, most of the serious events did not happen when the goal hematocrit is 33/hemoglobin 30.  I also discussed the use of IV iron infusion; and the potential  infusion reactions.  # Patient follow-up with me in approximately 7-10 days to discuss results of the bone marrow biopsy.  #Mild intermittent thrombocytopenia platelets-110 today; ITP versus MDS.  Today platelets are normal.  # CKD Stage III- creat 1.7 stable  # DISPOSITION: # cancel Bone marrow bx for now.  # follow up in 3 months- MD/labs- cbc/bmp-Dr.B      Cammie Sickle, MD 07/15/2018 2:05 PM

## 2018-07-15 NOTE — Assessment & Plan Note (Addendum)
#  Anemia mild hemoglobin likely since 2012.  Recent hemoglobin is 9.3 highly suspicious for MDS; no obvious evidence of iron deficiency.  #I had a long discussion the patient and family regarding the possible diagnosis of MDS likely low-grade upon the clinical history/his course.  Patient is mildly symptomatic.  However he is not too keen to get a bone marrow biopsy given his concerns of possible complications.  I did reassure the patient that complications are less likely to happen under CT guidance/anesthesia  #Mild intermittent thrombocytopenia platelets-110 today; ITP versus MDS.  Stable.  # CKD Stage III- creat 1.7 stable  #Patient follow-up with me in approximately 3 months; and if continues get worse would recommend a bone marrow biopsy.  He will call sooner if more symptomatic  # DISPOSITION: # cancel Bone marrow bx for now.  # follow up in 3 months- MD/labs- cbc/bmp-Dr.B 

## 2018-07-21 DIAGNOSIS — L97512 Non-pressure chronic ulcer of other part of right foot with fat layer exposed: Secondary | ICD-10-CM | POA: Diagnosis not present

## 2018-07-21 DIAGNOSIS — L97522 Non-pressure chronic ulcer of other part of left foot with fat layer exposed: Secondary | ICD-10-CM | POA: Diagnosis not present

## 2018-07-21 DIAGNOSIS — G609 Hereditary and idiopathic neuropathy, unspecified: Secondary | ICD-10-CM | POA: Diagnosis not present

## 2018-07-23 DIAGNOSIS — I130 Hypertensive heart and chronic kidney disease with heart failure and stage 1 through stage 4 chronic kidney disease, or unspecified chronic kidney disease: Secondary | ICD-10-CM | POA: Diagnosis not present

## 2018-07-23 DIAGNOSIS — K210 Gastro-esophageal reflux disease with esophagitis: Secondary | ICD-10-CM | POA: Diagnosis not present

## 2018-07-23 DIAGNOSIS — Z951 Presence of aortocoronary bypass graft: Secondary | ICD-10-CM | POA: Diagnosis not present

## 2018-07-23 DIAGNOSIS — Z7982 Long term (current) use of aspirin: Secondary | ICD-10-CM | POA: Diagnosis not present

## 2018-07-23 DIAGNOSIS — N400 Enlarged prostate without lower urinary tract symptoms: Secondary | ICD-10-CM | POA: Diagnosis not present

## 2018-07-23 DIAGNOSIS — K922 Gastrointestinal hemorrhage, unspecified: Secondary | ICD-10-CM | POA: Diagnosis not present

## 2018-07-23 DIAGNOSIS — R42 Dizziness and giddiness: Secondary | ICD-10-CM | POA: Diagnosis not present

## 2018-07-23 DIAGNOSIS — N183 Chronic kidney disease, stage 3 (moderate): Secondary | ICD-10-CM | POA: Diagnosis not present

## 2018-07-23 DIAGNOSIS — I252 Old myocardial infarction: Secondary | ICD-10-CM | POA: Diagnosis not present

## 2018-07-23 DIAGNOSIS — I5022 Chronic systolic (congestive) heart failure: Secondary | ICD-10-CM | POA: Diagnosis not present

## 2018-07-23 DIAGNOSIS — D649 Anemia, unspecified: Secondary | ICD-10-CM | POA: Diagnosis not present

## 2018-07-23 DIAGNOSIS — D696 Thrombocytopenia, unspecified: Secondary | ICD-10-CM | POA: Diagnosis not present

## 2018-07-23 DIAGNOSIS — I4891 Unspecified atrial fibrillation: Secondary | ICD-10-CM | POA: Diagnosis not present

## 2018-07-23 DIAGNOSIS — F17220 Nicotine dependence, chewing tobacco, uncomplicated: Secondary | ICD-10-CM | POA: Diagnosis not present

## 2018-07-23 DIAGNOSIS — I440 Atrioventricular block, first degree: Secondary | ICD-10-CM | POA: Diagnosis not present

## 2018-07-23 DIAGNOSIS — R0602 Shortness of breath: Secondary | ICD-10-CM | POA: Diagnosis not present

## 2018-07-23 DIAGNOSIS — D539 Nutritional anemia, unspecified: Secondary | ICD-10-CM | POA: Diagnosis not present

## 2018-07-23 DIAGNOSIS — I2510 Atherosclerotic heart disease of native coronary artery without angina pectoris: Secondary | ICD-10-CM | POA: Diagnosis not present

## 2018-07-23 DIAGNOSIS — K635 Polyp of colon: Secondary | ICD-10-CM | POA: Diagnosis not present

## 2018-07-23 DIAGNOSIS — I255 Ischemic cardiomyopathy: Secondary | ICD-10-CM | POA: Diagnosis not present

## 2018-07-24 DIAGNOSIS — I4891 Unspecified atrial fibrillation: Secondary | ICD-10-CM | POA: Diagnosis not present

## 2018-07-24 DIAGNOSIS — R42 Dizziness and giddiness: Secondary | ICD-10-CM | POA: Diagnosis not present

## 2018-07-24 DIAGNOSIS — D696 Thrombocytopenia, unspecified: Secondary | ICD-10-CM | POA: Diagnosis not present

## 2018-07-24 DIAGNOSIS — N400 Enlarged prostate without lower urinary tract symptoms: Secondary | ICD-10-CM | POA: Insufficient documentation

## 2018-07-24 DIAGNOSIS — D539 Nutritional anemia, unspecified: Secondary | ICD-10-CM | POA: Diagnosis not present

## 2018-07-24 DIAGNOSIS — I5022 Chronic systolic (congestive) heart failure: Secondary | ICD-10-CM | POA: Diagnosis not present

## 2018-07-24 DIAGNOSIS — K2100 Gastro-esophageal reflux disease with esophagitis, without bleeding: Secondary | ICD-10-CM | POA: Insufficient documentation

## 2018-07-24 DIAGNOSIS — R930 Abnormal findings on diagnostic imaging of skull and head, not elsewhere classified: Secondary | ICD-10-CM | POA: Diagnosis not present

## 2018-07-24 DIAGNOSIS — R0602 Shortness of breath: Secondary | ICD-10-CM | POA: Diagnosis not present

## 2018-07-28 ENCOUNTER — Telehealth: Payer: Self-pay | Admitting: *Deleted

## 2018-07-28 DIAGNOSIS — D500 Iron deficiency anemia secondary to blood loss (chronic): Secondary | ICD-10-CM

## 2018-07-28 NOTE — Telephone Encounter (Signed)
Please schedule patient in approx. 1 weeks time lab/md with cbc, and hold tube and possible 1 unit blood transfusion.

## 2018-07-28 NOTE — Telephone Encounter (Signed)
Patient went to ER at [facility] 07/23/18 and was found to have hgb of 7 hct 21, and had to get blood transfusion. He was advised to move his follow up appointment up with Dr B which is currently scheduled for June 3,2020. Please advise.

## 2018-07-31 DIAGNOSIS — D500 Iron deficiency anemia secondary to blood loss (chronic): Secondary | ICD-10-CM | POA: Diagnosis not present

## 2018-07-31 DIAGNOSIS — I5022 Chronic systolic (congestive) heart failure: Secondary | ICD-10-CM | POA: Diagnosis not present

## 2018-07-31 DIAGNOSIS — E538 Deficiency of other specified B group vitamins: Secondary | ICD-10-CM | POA: Diagnosis not present

## 2018-08-04 ENCOUNTER — Other Ambulatory Visit: Payer: Self-pay

## 2018-08-05 ENCOUNTER — Encounter: Payer: Self-pay | Admitting: Internal Medicine

## 2018-08-05 ENCOUNTER — Other Ambulatory Visit: Payer: Self-pay

## 2018-08-05 ENCOUNTER — Inpatient Hospital Stay (HOSPITAL_BASED_OUTPATIENT_CLINIC_OR_DEPARTMENT_OTHER): Payer: PPO | Admitting: Internal Medicine

## 2018-08-05 ENCOUNTER — Inpatient Hospital Stay: Payer: PPO

## 2018-08-05 VITALS — BP 123/63 | HR 102 | Temp 97.6°F | Resp 16 | Wt 205.4 lb

## 2018-08-05 VITALS — BP 136/59 | HR 68

## 2018-08-05 DIAGNOSIS — Z7982 Long term (current) use of aspirin: Secondary | ICD-10-CM

## 2018-08-05 DIAGNOSIS — D500 Iron deficiency anemia secondary to blood loss (chronic): Secondary | ICD-10-CM | POA: Diagnosis not present

## 2018-08-05 DIAGNOSIS — D649 Anemia, unspecified: Secondary | ICD-10-CM | POA: Insufficient documentation

## 2018-08-05 DIAGNOSIS — Z87891 Personal history of nicotine dependence: Secondary | ICD-10-CM | POA: Diagnosis not present

## 2018-08-05 DIAGNOSIS — N183 Chronic kidney disease, stage 3 unspecified: Secondary | ICD-10-CM | POA: Insufficient documentation

## 2018-08-05 DIAGNOSIS — F419 Anxiety disorder, unspecified: Secondary | ICD-10-CM

## 2018-08-05 DIAGNOSIS — I129 Hypertensive chronic kidney disease with stage 1 through stage 4 chronic kidney disease, or unspecified chronic kidney disease: Secondary | ICD-10-CM

## 2018-08-05 DIAGNOSIS — I2510 Atherosclerotic heart disease of native coronary artery without angina pectoris: Secondary | ICD-10-CM

## 2018-08-05 DIAGNOSIS — D696 Thrombocytopenia, unspecified: Secondary | ICD-10-CM | POA: Diagnosis not present

## 2018-08-05 DIAGNOSIS — D539 Nutritional anemia, unspecified: Secondary | ICD-10-CM | POA: Insufficient documentation

## 2018-08-05 DIAGNOSIS — R634 Abnormal weight loss: Secondary | ICD-10-CM | POA: Diagnosis not present

## 2018-08-05 DIAGNOSIS — I509 Heart failure, unspecified: Secondary | ICD-10-CM | POA: Diagnosis not present

## 2018-08-05 DIAGNOSIS — Z87442 Personal history of urinary calculi: Secondary | ICD-10-CM

## 2018-08-05 DIAGNOSIS — Z79899 Other long term (current) drug therapy: Secondary | ICD-10-CM | POA: Diagnosis not present

## 2018-08-05 DIAGNOSIS — D631 Anemia in chronic kidney disease: Secondary | ICD-10-CM

## 2018-08-05 DIAGNOSIS — I4891 Unspecified atrial fibrillation: Secondary | ICD-10-CM

## 2018-08-05 DIAGNOSIS — I252 Old myocardial infarction: Secondary | ICD-10-CM | POA: Diagnosis not present

## 2018-08-05 LAB — CBC WITH DIFFERENTIAL/PLATELET
Abs Immature Granulocytes: 0.06 10*3/uL (ref 0.00–0.07)
Basophils Absolute: 0 10*3/uL (ref 0.0–0.1)
Basophils Relative: 1 %
Eosinophils Absolute: 0.1 10*3/uL (ref 0.0–0.5)
Eosinophils Relative: 2 %
HCT: 25.7 % — ABNORMAL LOW (ref 39.0–52.0)
Hemoglobin: 8.1 g/dL — ABNORMAL LOW (ref 13.0–17.0)
IMMATURE GRANULOCYTES: 1 %
Lymphocytes Relative: 27 %
Lymphs Abs: 1.8 10*3/uL (ref 0.7–4.0)
MCH: 31.6 pg (ref 26.0–34.0)
MCHC: 31.5 g/dL (ref 30.0–36.0)
MCV: 100.4 fL — AB (ref 80.0–100.0)
Monocytes Absolute: 1.6 10*3/uL — ABNORMAL HIGH (ref 0.1–1.0)
Monocytes Relative: 24 %
NEUTROS PCT: 45 %
Neutro Abs: 3 10*3/uL (ref 1.7–7.7)
Platelets: 161 10*3/uL (ref 150–400)
RBC: 2.56 MIL/uL — ABNORMAL LOW (ref 4.22–5.81)
RDW: 16.6 % — ABNORMAL HIGH (ref 11.5–15.5)
WBC: 6.6 10*3/uL (ref 4.0–10.5)
nRBC: 0 % (ref 0.0–0.2)

## 2018-08-05 LAB — SAMPLE TO BLOOD BANK

## 2018-08-05 MED ORDER — SODIUM CHLORIDE 0.9 % IV SOLN
Freq: Once | INTRAVENOUS | Status: AC
  Administered 2018-08-05: 11:00:00 via INTRAVENOUS
  Filled 2018-08-05: qty 250

## 2018-08-05 MED ORDER — IRON SUCROSE 20 MG/ML IV SOLN
200.0000 mg | Freq: Once | INTRAVENOUS | Status: AC
  Administered 2018-08-05: 200 mg via INTRAVENOUS
  Filled 2018-08-05: qty 10

## 2018-08-05 NOTE — Patient Instructions (Signed)
STOP taking Asprin Send [place] back stool cards

## 2018-08-05 NOTE — Progress Notes (Signed)
[facility] NOTE  Patient Care Team: Rusty Aus, MD as PCP - General (Internal Medicine) Alisa Graff, FNP as Nurse Practitioner (Family Medicine) Ubaldo Glassing Javier Docker, MD as Consulting Physician (Cardiology) Erby Pian, MD as Referring Physician (Specialist)  CHIEF COMPLAINTS/PURPOSE OF CONSULTATION:   # 2012- Anemia 11-12- sec to CKD/IDA? MDS [no BMBx] [March 2017-sat-11%; ferritin-42] [Feb 2017- EGD/colo-July 2015 Dr.Skulskie];April 2017-PO Iron PO.   # Intermittent thrombocytopenia 120s-140s [Dec 2016-CT- neg cirrhosis/splenomegaly]  # CKD [creatinine 1.2- 1.4]; CAD/CHF  HISTORY OF PRESENTING ILLNESS:  Andres Gutierrez 85 y.o.  male history of above history of chronic anemia and intermittent thrombocytopenia is here for follow-up.  In the interim patient was evaluated at [facility] emergency room for worsening fatigue.  Work-up showed hemoglobin of 7.5.  He received 2 units of PRBC transfusion.  As per the family rectal exam was negative for blood.  Patient continues to feel tired.  Otherwise no nausea vomiting.  Complains of weight loss.  He had one episode of black stools.  However he has been on iron before.  Review of Systems  Constitutional: Positive for malaise/fatigue. Negative for chills, diaphoresis, fever and weight loss.  HENT: Negative for nosebleeds and sore throat.   Eyes: Negative for double vision.  Respiratory: Negative for cough, hemoptysis, sputum production, shortness of breath and wheezing.   Cardiovascular: Negative for chest pain, palpitations, orthopnea and leg swelling.  Gastrointestinal: Negative for abdominal pain, blood in stool, constipation, diarrhea, heartburn, melena, nausea and vomiting.  Genitourinary: Negative for dysuria, frequency and urgency.  Musculoskeletal: Positive for back pain and joint pain.  Skin: Negative.  Negative for itching and rash.  Neurological: Negative for dizziness, tingling, focal  weakness, weakness and headaches.  Endo/Heme/Allergies: Does not bruise/bleed easily.  Psychiatric/Behavioral: Negative for depression. The patient is not nervous/anxious and does not have insomnia.      MEDICAL HISTORY:  Past Medical History:  Diagnosis Date  . Anemia   . Anxiety   . Atrial fibrillation ([place])   . Cardiomyopathy ([place])   . CHF (congestive heart failure) ([place])   . Chronic kidney disease    kidney stones  . Coronary artery disease   . Cough   . Dysrhythmia   . Hypertension   . Lymphadenopathy, hilar   . MI (myocardial infarction) ([place])    x 2  . Wheezing     SURGICAL HISTORY: Past Surgical History:  Procedure Laterality Date  . BRONCHIAL NEEDLE ASPIRATION BIOPSY N/A 12/31/2014   Procedure: BRONCHIAL NEEDLE ASPIRATION BIOPSIES from carina;  Surgeon: Flora Lipps, MD;  Location: [facility] ORS;  Service: Cardiopulmonary;  Laterality: N/A;  . CARDIAC CATHETERIZATION N/A 12/28/2015   Procedure: Left Heart Cath and Cors/Grafts Angiography;  Surgeon: Yolonda Kida, MD;  Location: [facility];  Service: Cardiovascular;  Laterality: N/A;  . CATARACT EXTRACTION W/PHACO Left 08/01/2016   Procedure: CATARACT EXTRACTION PHACO AND INTRAOCULAR LENS PLACEMENT ([place]) Left;  Surgeon: Leandrew Koyanagi, MD;  Location: [facility];  Service: Ophthalmology;  Laterality: Left;  . CORONARY ANGIOPLASTY WITH STENT PLACEMENT    . CORONARY ARTERY BYPASS GRAFT    . ENDOBRONCHIAL ULTRASOUND N/A 12/31/2014   Procedure: ENDOBRONCHIAL ULTRASOUND;  Surgeon: Flora Lipps, MD;  Location: [facility] ORS;  Service: Cardiopulmonary;  Laterality: N/A;  . ESOPHAGOGASTRODUODENOSCOPY (EGD) WITH PROPOFOL N/A 06/20/2015   Procedure: ESOPHAGOGASTRODUODENOSCOPY (EGD) WITH PROPOFOL;  Surgeon: Lollie Sails, MD;  Location: [facility] END[facility];  Service: Endoscopy;  Laterality: N/A;    SOCIAL  HISTORY: lives in [place]; alone. 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  Social History   Socioeconomic History  . Marital  status: Widowed    Spouse name: Not on file  . Number of children: 2  . Years of education: college  . Highest education level: Some college, no degree  Occupational History  . Occupation: retired  Scientific laboratory technician  . Financial resource strain: Not hard at all  . Food insecurity:    Worry: Never true    Inability: Never true  . Transportation needs:    Medical: No    Non-medical: No  Tobacco Use  . Smoking status: Former Smoker    Last attempt to quit: 06/27/1977    Years since quitting: 41.1  . Smokeless tobacco: Current User    Types: Chew  Substance and Sexual Activity  . Alcohol use: Yes    Comment: occational almost rare once a year  . Drug use: No  . Sexual activity: Never    Birth control/protection: Abstinence  Lifestyle  . Physical activity:    Days per week: 7 days    Minutes per session: 30 min  . Stress: Not at all  Relationships  . Social connections:    Talks on phone: More than three times a week    Gets together: More than three times a week    Attends religious service: More than 4 times per year    Active member of club or organization: No    Attends meetings of clubs or organizations: Never    Relationship status: Widowed  . Intimate partner violence:    Fear of current or ex partner: Patient refused    Emotionally abused: Patient refused    Physically abused: Patient refused    Forced sexual activity: Patient refused  Other Topics Concern  . Not on file  Social History Narrative  . Not on file    FAMILY HISTORY: Family History  Problem Relation Age of Onset  . CAD Mother   . Colon cancer Father     ALLERGIES:  is allergic to 5ht3 receptor antagonists; diphenhydramine hcl; escitalopram; maxidex [dexamethasone]; prednisone; serotonin; vytorin [ezetimibe-simvastatin]; zocor [simvastatin]; and diltiazem.  MEDICATIONS:  Current Outpatient Medications  Medication Sig Dispense Refill  . acetaminophen (TYLENOL) 500 MG tablet Take 500 mg by mouth  every 6 (six) hours as needed for mild pain or moderate pain.    Marland Kitchen aspirin EC 81 MG tablet Take 81 mg by mouth daily.    Marland Kitchen atorvastatin (LIPITOR) 40 MG tablet Take 1 tablet (40 mg total) by mouth daily at 6 PM. 30 tablet 0  . digoxin (LANOXIN) 0.125 MG tablet Take 0.0625 mg by mouth daily.    Marland Kitchen docusate sodium (COLACE) 100 MG capsule Take 100 mg by mouth daily as needed.     . ferrous sulfate 325 (65 FE) MG tablet Take 1 tablet (325 mg total) by mouth daily. (Patient taking differently: Take 325 mg by mouth daily with breakfast. ) 30 tablet 3  . furosemide (LASIX) 20 MG tablet Take 40 mg by mouth as needed. For weight gain greater than 2 pounds in 24 hours or greater than 5 pounds in one week    . metoprolol tartrate (LOPRESSOR) 12.5 mg TABS tablet Take 12.5 mg by mouth 2 (two) times daily.    . nitroGLYCERIN (NITROSTAT) 0.4 MG SL tablet Place 0.4 mg under the tongue every 5 (five) minutes as needed for chest pain.    . pantoprazole (PROTONIX) 40 MG tablet Take  40 mg by mouth 2 (two) times daily before a meal.     . ranolazine (RANEXA) 500 MG 12 hr tablet Take 1 tablet (500 mg total) by mouth 2 (two) times daily. 60 tablet 0  . sucralfate (CARAFATE) 1 G tablet Take 1 g by mouth 2 (two) times daily.     . tamsulosin (FLOMAX) 0.4 MG CAPS capsule Take 0.4 mg by mouth daily after supper.     . vitamin C (VITAMIN C) 250 MG tablet Take 1 tablet (250 mg total) by mouth daily. 30 tablet 0   No current facility-administered medications for this visit.      PHYSICAL EXAMINATION: ECOG PERFORMANCE STATUS: 0 - Asymptomatic  Vitals:   08/05/18 0959 08/05/18 1000  BP: 123/63 123/63  Pulse: (!) 102 (!) 102  Resp: 16   Temp: 97.6 F (36.4 C) 97.6 F (36.4 C)   Filed Weights   08/05/18 0959 08/05/18 1000  Weight: 205 lb 6.4 oz (93.2 kg) 205 lb 6.4 oz (93.2 kg)    Physical Exam  Constitutional: He is oriented to person, place, and time.  Elderly Caucasian male patient. He is alone.  Walking by  himself.  HENT:  Head: Normocephalic and atraumatic.  Mouth/Throat: Oropharynx is clear and moist. No oropharyngeal exudate.  Eyes: Pupils are equal, round, and reactive to light.  Neck: Normal range of motion. Neck supple.  Cardiovascular: Normal rate and regular rhythm.  Pulmonary/Chest: Breath sounds normal. No respiratory distress. He has no wheezes.  Abdominal: Soft. Bowel sounds are normal. He exhibits no distension and no mass. There is no abdominal tenderness. There is no rebound and no guarding.  Musculoskeletal: Normal range of motion.        General: No tenderness or edema.  Neurological: He is alert and oriented to person, place, and time.  Skin: Skin is warm. There is pallor.  Psychiatric: Affect normal.   .   LABORATORY DATA:  I have reviewed the data as listed Lab Results  Component Value Date   WBC 6.6 08/05/2018   HGB 8.1 (L) 08/05/2018   HCT 25.7 (L) 08/05/2018   MCV 100.4 (H) 08/05/2018   PLT 161 08/05/2018   Recent Labs    12/19/17 0808 06/24/18 0946  NA 138 138  K 4.5 4.7  CL 106 106  CO2 20* 22  GLUCOSE 102* 110*  BUN 28* 25*  CREATININE 1.77* 1.62*  CALCIUM 8.9 9.3  GFRNONAA 34* 38*  GFRAA 39* 44*  PROT 8.1  --   ALBUMIN 4.5  --   AST 26  --   ALT 16  --   ALKPHOS 67  --   BILITOT 0.5  --    ASSESSMENT & PLAN:   Anemia of chronic kidney failure, stage 3 (moderate) (HCC) # Anemia mild hemoglobin likely since 2012. Recently worsening- Hb ~7.5 needing transfusion in ER-question blood loss [episode of black stool] versus others.  Start patient on Aranesp; also add IV iron.  #I suspect patient has underlying MDS causing his chronic anemia/also underlying CKD.  However the recent worsening of anemia concerning for possible acute causes like GI bleed.[See below].  #In general would recommend bone marrow biopsy to confirm diagnosis; however given the COVID pandemic- will proceed with aranesp.  Also recommend GI evaluation ASAP.  Recommend  stopping aspirin.  #Mild intermittent thrombocytopenia platelets-stable  # CKD Stage III- creat 1.7 stable.   # 25 minutes face-to-face with the patient discussing the above plan of care; more than 50%  of time spent on prognosis/ natural history; counseling and coordination.  Also discussed with the patient's granddaughter over the phone.   # DISPOSITION: # Stool cards x2 # NO Blood transfusion today.  # IV venofer today # GI referral-[facility] ASAP ? Bleeding/worsening anemia # IV ferrhem/  aranesp SQ [2 separate days]- starting next week # follow up in 2 weeks-MD/cbc/bmp-hold tube/ IV ferrahem-Aranesp SQ      Cammie Sickle, MD 08/05/2018 12:59 PM

## 2018-08-05 NOTE — Assessment & Plan Note (Addendum)
#  Anemia mild hemoglobin likely since 2012. Recently worsening- Hb ~7.5 needing transfusion in ER-question blood loss [episode of black stool] versus others.  Start patient on Aranesp; also add IV iron.  #I suspect patient has underlying MDS causing his chronic anemia/also underlying CKD.  However the recent worsening of anemia concerning for possible acute causes like GI bleed.[See below].  #In general would recommend bone marrow biopsy to confirm diagnosis; however given the COVID pandemic- will proceed with aranesp.  Also recommend GI evaluation ASAP.  Recommend stopping aspirin.  #Mild intermittent thrombocytopenia platelets-stable  # CKD Stage III- creat 1.7 stable.   # 25 minutes face-to-face with the patient discussing the above plan of care; more than 50% of time spent on prognosis/ natural history; counseling and coordination.  Also discussed with the patient's granddaughter over the phone.   # DISPOSITION: # Stool cards x2 # NO Blood transfusion today.  # IV venofer today # GI referral-[facility] ASAP ? Bleeding/worsening anemia # IV ferrhem/  aranesp SQ [2 separate days]- starting next week # follow up in 2 weeks-MD/cbc/bmp-hold tube/ IV ferrahem-Aranesp SQ

## 2018-08-05 NOTE — Assessment & Plan Note (Deleted)
#  Anemia mild hemoglobin likely since 2012.  Recent hemoglobin is 9.3 highly suspicious for MDS; no obvious evidence of iron deficiency.  #I had a long discussion the patient and family regarding the possible diagnosis of MDS likely low-grade upon the clinical history/his course.  Patient is mildly symptomatic.  However he is not too keen to get a bone marrow biopsy given his concerns of possible complications.  I did reassure the patient that complications are less likely to happen under CT guidance/anesthesia  #Mild intermittent thrombocytopenia platelets-110 today; ITP versus MDS.  Stable.  # CKD Stage III- creat 1.7 stable  #Patient follow-up with me in approximately 3 months; and if continues get worse would recommend a bone marrow biopsy.  He will call sooner if more symptomatic  # DISPOSITION: # cancel Bone marrow bx for now.  # follow up in 3 months- MD/labs- cbc/bmp-Dr.B

## 2018-08-07 DIAGNOSIS — K2210 Ulcer of esophagus without bleeding: Secondary | ICD-10-CM | POA: Diagnosis not present

## 2018-08-07 DIAGNOSIS — Z0000 Encounter for general adult medical examination without abnormal findings: Secondary | ICD-10-CM | POA: Diagnosis not present

## 2018-08-07 DIAGNOSIS — D696 Thrombocytopenia, unspecified: Secondary | ICD-10-CM | POA: Diagnosis not present

## 2018-08-07 DIAGNOSIS — D500 Iron deficiency anemia secondary to blood loss (chronic): Secondary | ICD-10-CM | POA: Diagnosis not present

## 2018-08-07 DIAGNOSIS — I482 Chronic atrial fibrillation, unspecified: Secondary | ICD-10-CM | POA: Diagnosis not present

## 2018-08-07 DIAGNOSIS — I5022 Chronic systolic (congestive) heart failure: Secondary | ICD-10-CM | POA: Diagnosis not present

## 2018-08-11 ENCOUNTER — Other Ambulatory Visit: Payer: Self-pay

## 2018-08-12 ENCOUNTER — Other Ambulatory Visit: Payer: Self-pay

## 2018-08-12 ENCOUNTER — Inpatient Hospital Stay: Payer: PPO

## 2018-08-12 ENCOUNTER — Other Ambulatory Visit: Payer: Self-pay | Admitting: Internal Medicine

## 2018-08-12 VITALS — BP 152/70 | HR 66 | Resp 18

## 2018-08-12 DIAGNOSIS — D500 Iron deficiency anemia secondary to blood loss (chronic): Secondary | ICD-10-CM | POA: Diagnosis not present

## 2018-08-12 DIAGNOSIS — N183 Chronic kidney disease, stage 3 unspecified: Secondary | ICD-10-CM

## 2018-08-12 DIAGNOSIS — D631 Anemia in chronic kidney disease: Secondary | ICD-10-CM

## 2018-08-12 MED ORDER — SODIUM CHLORIDE 0.9 % IV SOLN
510.0000 mg | Freq: Once | INTRAVENOUS | Status: AC
  Administered 2018-08-12: 510 mg via INTRAVENOUS
  Filled 2018-08-12: qty 17

## 2018-08-12 MED ORDER — SODIUM CHLORIDE 0.9 % IV SOLN
Freq: Once | INTRAVENOUS | Status: AC
  Administered 2018-08-12: 12:00:00 via INTRAVENOUS
  Filled 2018-08-12: qty 250

## 2018-08-13 ENCOUNTER — Inpatient Hospital Stay: Payer: PPO | Attending: Oncology

## 2018-08-13 ENCOUNTER — Other Ambulatory Visit: Payer: Self-pay

## 2018-08-13 VITALS — BP 99/56 | HR 72

## 2018-08-13 DIAGNOSIS — N183 Chronic kidney disease, stage 3 (moderate): Secondary | ICD-10-CM | POA: Diagnosis not present

## 2018-08-13 DIAGNOSIS — I129 Hypertensive chronic kidney disease with stage 1 through stage 4 chronic kidney disease, or unspecified chronic kidney disease: Secondary | ICD-10-CM | POA: Insufficient documentation

## 2018-08-13 DIAGNOSIS — D509 Iron deficiency anemia, unspecified: Secondary | ICD-10-CM | POA: Diagnosis not present

## 2018-08-13 DIAGNOSIS — Z79899 Other long term (current) drug therapy: Secondary | ICD-10-CM | POA: Insufficient documentation

## 2018-08-13 DIAGNOSIS — D631 Anemia in chronic kidney disease: Secondary | ICD-10-CM | POA: Insufficient documentation

## 2018-08-13 DIAGNOSIS — D500 Iron deficiency anemia secondary to blood loss (chronic): Secondary | ICD-10-CM

## 2018-08-13 MED ORDER — DARBEPOETIN ALFA 300 MCG/0.6ML IJ SOSY
300.0000 ug | PREFILLED_SYRINGE | Freq: Once | INTRAMUSCULAR | Status: AC
  Administered 2018-08-13: 300 ug via SUBCUTANEOUS
  Filled 2018-08-13: qty 0.6

## 2018-08-13 NOTE — Progress Notes (Signed)
MD approves to proceed with Aranesp inj today using labs drawn on 08/05/2018.

## 2018-08-14 ENCOUNTER — Other Ambulatory Visit: Payer: Self-pay

## 2018-08-14 DIAGNOSIS — D500 Iron deficiency anemia secondary to blood loss (chronic): Secondary | ICD-10-CM

## 2018-08-17 DIAGNOSIS — I129 Hypertensive chronic kidney disease with stage 1 through stage 4 chronic kidney disease, or unspecified chronic kidney disease: Secondary | ICD-10-CM | POA: Diagnosis not present

## 2018-08-18 ENCOUNTER — Other Ambulatory Visit: Payer: Self-pay

## 2018-08-18 ENCOUNTER — Other Ambulatory Visit: Payer: Self-pay | Admitting: *Deleted

## 2018-08-18 DIAGNOSIS — D500 Iron deficiency anemia secondary to blood loss (chronic): Secondary | ICD-10-CM

## 2018-08-18 DIAGNOSIS — I10 Essential (primary) hypertension: Secondary | ICD-10-CM | POA: Diagnosis not present

## 2018-08-18 DIAGNOSIS — E782 Mixed hyperlipidemia: Secondary | ICD-10-CM | POA: Diagnosis not present

## 2018-08-18 DIAGNOSIS — D649 Anemia, unspecified: Secondary | ICD-10-CM | POA: Diagnosis not present

## 2018-08-18 DIAGNOSIS — K219 Gastro-esophageal reflux disease without esophagitis: Secondary | ICD-10-CM | POA: Diagnosis not present

## 2018-08-18 DIAGNOSIS — E1151 Type 2 diabetes mellitus with diabetic peripheral angiopathy without gangrene: Secondary | ICD-10-CM | POA: Diagnosis not present

## 2018-08-18 DIAGNOSIS — I129 Hypertensive chronic kidney disease with stage 1 through stage 4 chronic kidney disease, or unspecified chronic kidney disease: Secondary | ICD-10-CM | POA: Diagnosis not present

## 2018-08-18 DIAGNOSIS — Z23 Encounter for immunization: Secondary | ICD-10-CM | POA: Diagnosis not present

## 2018-08-18 DIAGNOSIS — I779 Disorder of arteries and arterioles, unspecified: Secondary | ICD-10-CM | POA: Diagnosis not present

## 2018-08-18 LAB — OCCULT BLOOD X 1 CARD TO LAB, STOOL
Fecal Occult Bld: NEGATIVE
Fecal Occult Bld: NEGATIVE

## 2018-08-19 ENCOUNTER — Inpatient Hospital Stay: Payer: PPO

## 2018-08-19 ENCOUNTER — Inpatient Hospital Stay (HOSPITAL_BASED_OUTPATIENT_CLINIC_OR_DEPARTMENT_OTHER): Payer: PPO | Admitting: Internal Medicine

## 2018-08-19 ENCOUNTER — Other Ambulatory Visit: Payer: Self-pay

## 2018-08-19 ENCOUNTER — Encounter: Payer: Self-pay | Admitting: Internal Medicine

## 2018-08-19 VITALS — BP 135/66 | HR 72 | Resp 18

## 2018-08-19 DIAGNOSIS — Z79899 Other long term (current) drug therapy: Secondary | ICD-10-CM | POA: Diagnosis not present

## 2018-08-19 DIAGNOSIS — D631 Anemia in chronic kidney disease: Secondary | ICD-10-CM

## 2018-08-19 DIAGNOSIS — N183 Chronic kidney disease, stage 3 unspecified: Secondary | ICD-10-CM

## 2018-08-19 DIAGNOSIS — D539 Nutritional anemia, unspecified: Secondary | ICD-10-CM

## 2018-08-19 DIAGNOSIS — D500 Iron deficiency anemia secondary to blood loss (chronic): Secondary | ICD-10-CM

## 2018-08-19 DIAGNOSIS — I129 Hypertensive chronic kidney disease with stage 1 through stage 4 chronic kidney disease, or unspecified chronic kidney disease: Secondary | ICD-10-CM | POA: Diagnosis not present

## 2018-08-19 LAB — SAMPLE TO BLOOD BANK

## 2018-08-19 LAB — BASIC METABOLIC PANEL
Anion gap: 10 (ref 5–15)
BUN: 23 mg/dL (ref 8–23)
CO2: 20 mmol/L — ABNORMAL LOW (ref 22–32)
Calcium: 8.9 mg/dL (ref 8.9–10.3)
Chloride: 107 mmol/L (ref 98–111)
Creatinine, Ser: 1.72 mg/dL — ABNORMAL HIGH (ref 0.61–1.24)
GFR calc Af Amer: 41 mL/min — ABNORMAL LOW (ref >60)
GFR calc non Af Amer: 35 mL/min — ABNORMAL LOW (ref >60)
Glucose, Bld: 109 mg/dL — ABNORMAL HIGH (ref 70–99)
Potassium: 4 mmol/L (ref 3.5–5.1)
Sodium: 137 mmol/L (ref 135–145)

## 2018-08-19 LAB — CBC WITH DIFFERENTIAL/PLATELET
Abs Immature Granulocytes: 0.23 10*3/uL — ABNORMAL HIGH (ref 0.00–0.07)
Basophils Absolute: 0 10*3/uL (ref 0.0–0.1)
Basophils Relative: 0 %
Eosinophils Absolute: 0.1 10*3/uL (ref 0.0–0.5)
Eosinophils Relative: 1 %
HCT: 26.6 % — ABNORMAL LOW (ref 39.0–52.0)
Hemoglobin: 8.5 g/dL — ABNORMAL LOW (ref 13.0–17.0)
Immature Granulocytes: 3 %
Lymphocytes Relative: 23 %
Lymphs Abs: 1.6 10*3/uL (ref 0.7–4.0)
MCH: 32.8 pg (ref 26.0–34.0)
MCHC: 32 g/dL (ref 30.0–36.0)
MCV: 102.7 fL — ABNORMAL HIGH (ref 80.0–100.0)
Monocytes Absolute: 2.1 10*3/uL — ABNORMAL HIGH (ref 0.1–1.0)
Monocytes Relative: 30 %
Neutro Abs: 3 10*3/uL (ref 1.7–7.7)
Neutrophils Relative %: 43 %
Platelets: 197 10*3/uL (ref 150–400)
RBC: 2.59 MIL/uL — ABNORMAL LOW (ref 4.22–5.81)
RDW: 19.5 % — ABNORMAL HIGH (ref 11.5–15.5)
Smear Review: ADEQUATE
WBC: 7 10*3/uL (ref 4.0–10.5)
nRBC: 0.4 % — ABNORMAL HIGH (ref 0.0–0.2)

## 2018-08-19 MED ORDER — SODIUM CHLORIDE 0.9 % IV SOLN
Freq: Once | INTRAVENOUS | Status: AC
  Administered 2018-08-19: 11:00:00 via INTRAVENOUS
  Filled 2018-08-19: qty 250

## 2018-08-19 MED ORDER — SODIUM CHLORIDE 0.9 % IV SOLN
510.0000 mg | Freq: Once | INTRAVENOUS | Status: AC
  Administered 2018-08-19: 510 mg via INTRAVENOUS
  Filled 2018-08-19: qty 17

## 2018-08-20 ENCOUNTER — Other Ambulatory Visit: Payer: Self-pay

## 2018-08-20 ENCOUNTER — Inpatient Hospital Stay: Payer: PPO

## 2018-08-20 VITALS — BP 136/60 | HR 70

## 2018-08-20 DIAGNOSIS — D500 Iron deficiency anemia secondary to blood loss (chronic): Secondary | ICD-10-CM

## 2018-08-20 DIAGNOSIS — D631 Anemia in chronic kidney disease: Secondary | ICD-10-CM

## 2018-08-20 DIAGNOSIS — N183 Chronic kidney disease, stage 3 (moderate): Principal | ICD-10-CM

## 2018-08-20 DIAGNOSIS — M25551 Pain in right hip: Secondary | ICD-10-CM | POA: Diagnosis not present

## 2018-08-20 DIAGNOSIS — I129 Hypertensive chronic kidney disease with stage 1 through stage 4 chronic kidney disease, or unspecified chronic kidney disease: Secondary | ICD-10-CM | POA: Diagnosis not present

## 2018-08-20 DIAGNOSIS — I10 Essential (primary) hypertension: Secondary | ICD-10-CM | POA: Diagnosis not present

## 2018-08-20 DIAGNOSIS — E7800 Pure hypercholesterolemia, unspecified: Secondary | ICD-10-CM | POA: Diagnosis not present

## 2018-08-20 DIAGNOSIS — E538 Deficiency of other specified B group vitamins: Secondary | ICD-10-CM | POA: Diagnosis not present

## 2018-08-20 DIAGNOSIS — D649 Anemia, unspecified: Secondary | ICD-10-CM | POA: Diagnosis not present

## 2018-08-20 DIAGNOSIS — Z1389 Encounter for screening for other disorder: Secondary | ICD-10-CM | POA: Diagnosis not present

## 2018-08-20 DIAGNOSIS — N400 Enlarged prostate without lower urinary tract symptoms: Secondary | ICD-10-CM | POA: Diagnosis not present

## 2018-08-20 DIAGNOSIS — Z23 Encounter for immunization: Secondary | ICD-10-CM | POA: Diagnosis not present

## 2018-08-20 DIAGNOSIS — I4891 Unspecified atrial fibrillation: Secondary | ICD-10-CM | POA: Diagnosis not present

## 2018-08-20 DIAGNOSIS — Z0000 Encounter for general adult medical examination without abnormal findings: Secondary | ICD-10-CM | POA: Diagnosis not present

## 2018-08-20 MED ORDER — DARBEPOETIN ALFA 300 MCG/0.6ML IJ SOSY
300.0000 ug | PREFILLED_SYRINGE | Freq: Once | INTRAMUSCULAR | Status: AC
  Administered 2018-08-20: 10:00:00 300 ug via SUBCUTANEOUS
  Filled 2018-08-20: qty 0.6

## 2018-08-20 NOTE — Assessment & Plan Note (Signed)
#  Anemia mild hemoglobin likely since 2012.  Suspect MDS/chronic kidney disease.  No bone marrow biopsy [covid]  #Hemoglobin 8.7 slightly improving.  Continue Feraheme/Aranesp.  #Mild intermittent thrombocytopenia platelets improved  # CKD Stage III- creat 1.7 stable.   #Disposition: # 2 weeks CBC/Feraheme Aranesp-different days # 4 weeks-MD CBC/Feraheme Aranesp-different days- Dr.B

## 2018-08-20 NOTE — Progress Notes (Signed)
I connected with _0  @ on 08/19/2018 at  2:00 PM EDTby telephone and verified that I am speaking with the patient using 2 identifiers.  # LOCATION:  Patient: Home/daughter Provider: Home  I discussed the limitations, risks, security and privacy concerns of performing an evaluation and management service by telephone and the availability of in person appointments.  I also discussed with the patient that there may be a patient responsible charge related to the service.  The patient expressed understanding and agrees to proceed.  History of present illness:Andres Gutierrez 85 y.o.  male with history of anemia chronic kidney disease-on Feraheme/Aranesp.  Patient hard of hearing/as per daughter-patient energy levels improving.  Not any worse.  No blood in stools black or stools.  Observation/objective: Hemoglobin 8.7 slightly improved.  Creatinine 1.7 stable.  Assessment and plan: Anemia of chronic kidney failure, stage 3 (moderate) (HCC) # Anemia mild hemoglobin likely since 2012.  Suspect MDS/chronic kidney disease.  No bone marrow biopsy [covid]  #Hemoglobin 8.7 slightly improving.  Continue Feraheme/Aranesp.  #Mild intermittent thrombocytopenia platelets improved  # CKD Stage III- creat 1.7 stable.   #Disposition: # 2 weeks CBC/Feraheme Aranesp-different days # 4 weeks-MD CBC/Feraheme Aranesp-different days- Dr.B     Follow-up instructions:  I discussed the assessment and treatment plan with the patient.  The patient was provided an opportunity to ask questions and all were answered.  The patient agreed with the plan and demonstrated understanding of instructions.  The patient was advised to call back or seek an in person evaluation if the symptoms worsen or if the condition fails to improve as anticipated.  I provided 12 minutes of non-face-to-face time during this encounter   Dr. Charlaine Dalton [facility] at [facility] 08/20/2018 12:07 PM

## 2018-08-28 DIAGNOSIS — I25810 Atherosclerosis of coronary artery bypass graft(s) without angina pectoris: Secondary | ICD-10-CM | POA: Diagnosis not present

## 2018-08-28 DIAGNOSIS — I10 Essential (primary) hypertension: Secondary | ICD-10-CM | POA: Diagnosis not present

## 2018-08-28 DIAGNOSIS — I482 Chronic atrial fibrillation, unspecified: Secondary | ICD-10-CM | POA: Diagnosis not present

## 2018-08-28 DIAGNOSIS — I5022 Chronic systolic (congestive) heart failure: Secondary | ICD-10-CM | POA: Diagnosis not present

## 2018-08-28 DIAGNOSIS — I255 Ischemic cardiomyopathy: Secondary | ICD-10-CM | POA: Diagnosis not present

## 2018-08-28 DIAGNOSIS — E782 Mixed hyperlipidemia: Secondary | ICD-10-CM | POA: Diagnosis not present

## 2018-09-01 ENCOUNTER — Other Ambulatory Visit: Payer: Self-pay

## 2018-09-01 DIAGNOSIS — L97521 Non-pressure chronic ulcer of other part of left foot limited to breakdown of skin: Secondary | ICD-10-CM | POA: Diagnosis not present

## 2018-09-01 DIAGNOSIS — L97512 Non-pressure chronic ulcer of other part of right foot with fat layer exposed: Secondary | ICD-10-CM | POA: Diagnosis not present

## 2018-09-02 ENCOUNTER — Other Ambulatory Visit: Payer: Self-pay

## 2018-09-02 ENCOUNTER — Inpatient Hospital Stay: Payer: PPO

## 2018-09-02 VITALS — BP 131/67 | HR 66 | Temp 98.1°F | Resp 20

## 2018-09-02 DIAGNOSIS — N183 Chronic kidney disease, stage 3 unspecified: Secondary | ICD-10-CM

## 2018-09-02 DIAGNOSIS — D500 Iron deficiency anemia secondary to blood loss (chronic): Secondary | ICD-10-CM

## 2018-09-02 DIAGNOSIS — D539 Nutritional anemia, unspecified: Secondary | ICD-10-CM

## 2018-09-02 DIAGNOSIS — D631 Anemia in chronic kidney disease: Secondary | ICD-10-CM

## 2018-09-02 DIAGNOSIS — I129 Hypertensive chronic kidney disease with stage 1 through stage 4 chronic kidney disease, or unspecified chronic kidney disease: Secondary | ICD-10-CM | POA: Diagnosis not present

## 2018-09-02 LAB — CBC WITH DIFFERENTIAL/PLATELET
Abs Immature Granulocytes: 0.07 10*3/uL (ref 0.00–0.07)
Basophils Absolute: 0 10*3/uL (ref 0.0–0.1)
Basophils Relative: 0 %
Eosinophils Absolute: 0.1 10*3/uL (ref 0.0–0.5)
Eosinophils Relative: 2 %
HCT: 31.1 % — ABNORMAL LOW (ref 39.0–52.0)
Hemoglobin: 9.6 g/dL — ABNORMAL LOW (ref 13.0–17.0)
Immature Granulocytes: 1 %
Lymphocytes Relative: 26 %
Lymphs Abs: 2 10*3/uL (ref 0.7–4.0)
MCH: 31.9 pg (ref 26.0–34.0)
MCHC: 30.9 g/dL (ref 30.0–36.0)
MCV: 103.3 fL — ABNORMAL HIGH (ref 80.0–100.0)
Monocytes Absolute: 1.9 10*3/uL — ABNORMAL HIGH (ref 0.1–1.0)
Monocytes Relative: 25 %
Neutro Abs: 3.5 10*3/uL (ref 1.7–7.7)
Neutrophils Relative %: 46 %
Platelets: 183 10*3/uL (ref 150–400)
RBC: 3.01 MIL/uL — ABNORMAL LOW (ref 4.22–5.81)
RDW: 19.6 % — ABNORMAL HIGH (ref 11.5–15.5)
WBC: 7.6 10*3/uL (ref 4.0–10.5)
nRBC: 0 % (ref 0.0–0.2)

## 2018-09-02 MED ORDER — SODIUM CHLORIDE 0.9 % IV SOLN
Freq: Once | INTRAVENOUS | Status: AC
  Administered 2018-09-02: 11:00:00 via INTRAVENOUS
  Filled 2018-09-02: qty 250

## 2018-09-02 MED ORDER — SODIUM CHLORIDE 0.9 % IV SOLN
510.0000 mg | Freq: Once | INTRAVENOUS | Status: AC
  Administered 2018-09-02: 11:00:00 510 mg via INTRAVENOUS
  Filled 2018-09-02: qty 17

## 2018-09-03 ENCOUNTER — Other Ambulatory Visit: Payer: Self-pay

## 2018-09-03 ENCOUNTER — Inpatient Hospital Stay: Payer: PPO | Attending: Internal Medicine

## 2018-09-03 VITALS — BP 151/65 | HR 75

## 2018-09-03 DIAGNOSIS — D631 Anemia in chronic kidney disease: Secondary | ICD-10-CM | POA: Diagnosis not present

## 2018-09-03 DIAGNOSIS — D509 Iron deficiency anemia, unspecified: Secondary | ICD-10-CM | POA: Diagnosis not present

## 2018-09-03 DIAGNOSIS — Z79899 Other long term (current) drug therapy: Secondary | ICD-10-CM | POA: Diagnosis not present

## 2018-09-03 DIAGNOSIS — N183 Chronic kidney disease, stage 3 (moderate): Secondary | ICD-10-CM | POA: Insufficient documentation

## 2018-09-03 DIAGNOSIS — I129 Hypertensive chronic kidney disease with stage 1 through stage 4 chronic kidney disease, or unspecified chronic kidney disease: Secondary | ICD-10-CM | POA: Diagnosis not present

## 2018-09-03 DIAGNOSIS — D500 Iron deficiency anemia secondary to blood loss (chronic): Secondary | ICD-10-CM

## 2018-09-03 MED ORDER — DARBEPOETIN ALFA 300 MCG/0.6ML IJ SOSY
300.0000 ug | PREFILLED_SYRINGE | Freq: Once | INTRAMUSCULAR | Status: AC
  Administered 2018-09-03: 300 ug via SUBCUTANEOUS
  Filled 2018-09-03: qty 0.6

## 2018-09-08 DIAGNOSIS — I25810 Atherosclerosis of coronary artery bypass graft(s) without angina pectoris: Secondary | ICD-10-CM | POA: Diagnosis not present

## 2018-09-08 DIAGNOSIS — I255 Ischemic cardiomyopathy: Secondary | ICD-10-CM | POA: Diagnosis not present

## 2018-09-08 DIAGNOSIS — I5022 Chronic systolic (congestive) heart failure: Secondary | ICD-10-CM | POA: Diagnosis not present

## 2018-09-08 DIAGNOSIS — I482 Chronic atrial fibrillation, unspecified: Secondary | ICD-10-CM | POA: Diagnosis not present

## 2018-09-12 ENCOUNTER — Telehealth: Payer: Self-pay | Admitting: *Deleted

## 2018-09-12 NOTE — Telephone Encounter (Signed)
Spoke with daughter - She is aware of the need for a virtual visit. She and the patient will be available next Tuesday for the virtual visit. Information provided on doximetry visit.

## 2018-09-16 ENCOUNTER — Encounter: Payer: Self-pay | Admitting: Internal Medicine

## 2018-09-16 ENCOUNTER — Inpatient Hospital Stay: Payer: PPO

## 2018-09-16 ENCOUNTER — Telehealth: Payer: Self-pay | Admitting: Internal Medicine

## 2018-09-16 ENCOUNTER — Inpatient Hospital Stay (HOSPITAL_BASED_OUTPATIENT_CLINIC_OR_DEPARTMENT_OTHER): Payer: PPO | Admitting: Internal Medicine

## 2018-09-16 ENCOUNTER — Inpatient Hospital Stay: Payer: PPO | Attending: Internal Medicine

## 2018-09-16 ENCOUNTER — Other Ambulatory Visit: Payer: Self-pay

## 2018-09-16 VITALS — BP 145/63 | HR 66 | Temp 95.8°F | Resp 18

## 2018-09-16 DIAGNOSIS — D631 Anemia in chronic kidney disease: Secondary | ICD-10-CM

## 2018-09-16 DIAGNOSIS — N183 Chronic kidney disease, stage 3 unspecified: Secondary | ICD-10-CM

## 2018-09-16 DIAGNOSIS — Z79899 Other long term (current) drug therapy: Secondary | ICD-10-CM | POA: Diagnosis not present

## 2018-09-16 DIAGNOSIS — D509 Iron deficiency anemia, unspecified: Secondary | ICD-10-CM | POA: Insufficient documentation

## 2018-09-16 DIAGNOSIS — D539 Nutritional anemia, unspecified: Secondary | ICD-10-CM

## 2018-09-16 DIAGNOSIS — D500 Iron deficiency anemia secondary to blood loss (chronic): Secondary | ICD-10-CM

## 2018-09-16 LAB — BASIC METABOLIC PANEL
Anion gap: 9 (ref 5–15)
BUN: 30 mg/dL — ABNORMAL HIGH (ref 8–23)
CO2: 21 mmol/L — ABNORMAL LOW (ref 22–32)
Calcium: 9.2 mg/dL (ref 8.9–10.3)
Chloride: 107 mmol/L (ref 98–111)
Creatinine, Ser: 1.69 mg/dL — ABNORMAL HIGH (ref 0.61–1.24)
GFR calc Af Amer: 42 mL/min — ABNORMAL LOW (ref >60)
GFR calc non Af Amer: 36 mL/min — ABNORMAL LOW (ref >60)
Glucose, Bld: 101 mg/dL — ABNORMAL HIGH (ref 70–99)
Potassium: 4.5 mmol/L (ref 3.5–5.1)
Sodium: 137 mmol/L (ref 135–145)

## 2018-09-16 LAB — CBC WITH DIFFERENTIAL/PLATELET
Abs Immature Granulocytes: 0.07 10*3/uL (ref 0.00–0.07)
Basophils Absolute: 0 10*3/uL (ref 0.0–0.1)
Basophils Relative: 0 %
Eosinophils Absolute: 0.1 10*3/uL (ref 0.0–0.5)
Eosinophils Relative: 2 %
HCT: 32.8 % — ABNORMAL LOW (ref 39.0–52.0)
Hemoglobin: 10.5 g/dL — ABNORMAL LOW (ref 13.0–17.0)
Immature Granulocytes: 1 %
Lymphocytes Relative: 25 %
Lymphs Abs: 1.9 10*3/uL (ref 0.7–4.0)
MCH: 32.8 pg (ref 26.0–34.0)
MCHC: 32 g/dL (ref 30.0–36.0)
MCV: 102.5 fL — ABNORMAL HIGH (ref 80.0–100.0)
Monocytes Absolute: 2.6 10*3/uL — ABNORMAL HIGH (ref 0.1–1.0)
Monocytes Relative: 33 %
Neutro Abs: 3.1 10*3/uL (ref 1.7–7.7)
Neutrophils Relative %: 39 %
Platelets: 163 10*3/uL (ref 150–400)
RBC: 3.2 MIL/uL — ABNORMAL LOW (ref 4.22–5.81)
RDW: 19 % — ABNORMAL HIGH (ref 11.5–15.5)
WBC: 7.8 10*3/uL (ref 4.0–10.5)
nRBC: 0 % (ref 0.0–0.2)

## 2018-09-16 MED ORDER — SODIUM CHLORIDE 0.9 % IV SOLN
Freq: Once | INTRAVENOUS | Status: AC
  Administered 2018-09-16: 13:00:00 via INTRAVENOUS
  Filled 2018-09-16: qty 250

## 2018-09-16 MED ORDER — SODIUM CHLORIDE 0.9 % IV SOLN
510.0000 mg | Freq: Once | INTRAVENOUS | Status: AC
  Administered 2018-09-16: 510 mg via INTRAVENOUS
  Filled 2018-09-16: qty 17

## 2018-09-16 NOTE — Progress Notes (Signed)
Pt not avaialble spoke to daughter.

## 2018-09-16 NOTE — Assessment & Plan Note (Addendum)
#  Anemia mild hemoglobin likely since 2012.  Suspect MDS/chronic kidney disease.  No bone marrow biopsy [covid]  #Hemoglobin 10.3; continue Feraheme today.  No Aranesp  #Mild intermittent thrombocytopenia platelets improved  # CKD Stage III- creat 1.7 stable.   #Disposition: # cancel appt for tomorrow.  # 2 weeks CBC/possible Aranesp- # keep appt on June 3rd with MD-add labs-cbc/bmp;possible aranesp-Dr.B

## 2018-09-16 NOTE — Telephone Encounter (Signed)
Patient getting IV iron infusion in the clinic.  Not at home.  Spoke with daughter clinically doing well hemoglobin 10.5.  Continue IV Feraheme.  #Disposition: # cancel appt for tomorrow.  # 2 weeks CBC/possible Aranesp- # keep appt on June 3rd with MD-add labs-cbc/bmp;possible aranesp-Dr.B

## 2018-09-17 ENCOUNTER — Inpatient Hospital Stay: Payer: PPO

## 2018-09-22 DIAGNOSIS — L97512 Non-pressure chronic ulcer of other part of right foot with fat layer exposed: Secondary | ICD-10-CM | POA: Diagnosis not present

## 2018-09-22 DIAGNOSIS — L97521 Non-pressure chronic ulcer of other part of left foot limited to breakdown of skin: Secondary | ICD-10-CM | POA: Diagnosis not present

## 2018-09-29 ENCOUNTER — Other Ambulatory Visit: Payer: Self-pay

## 2018-09-30 ENCOUNTER — Other Ambulatory Visit: Payer: Self-pay

## 2018-09-30 ENCOUNTER — Other Ambulatory Visit: Payer: Self-pay | Admitting: *Deleted

## 2018-09-30 ENCOUNTER — Inpatient Hospital Stay: Payer: PPO

## 2018-09-30 DIAGNOSIS — D500 Iron deficiency anemia secondary to blood loss (chronic): Secondary | ICD-10-CM

## 2018-09-30 DIAGNOSIS — D509 Iron deficiency anemia, unspecified: Secondary | ICD-10-CM | POA: Diagnosis not present

## 2018-09-30 LAB — CBC WITH DIFFERENTIAL/PLATELET
Abs Immature Granulocytes: 0.07 10*3/uL (ref 0.00–0.07)
Basophils Absolute: 0 10*3/uL (ref 0.0–0.1)
Basophils Relative: 0 %
Eosinophils Absolute: 0.1 10*3/uL (ref 0.0–0.5)
Eosinophils Relative: 1 %
HCT: 33.2 % — ABNORMAL LOW (ref 39.0–52.0)
Hemoglobin: 10.6 g/dL — ABNORMAL LOW (ref 13.0–17.0)
Immature Granulocytes: 1 %
Lymphocytes Relative: 25 %
Lymphs Abs: 1.9 10*3/uL (ref 0.7–4.0)
MCH: 32.1 pg (ref 26.0–34.0)
MCHC: 31.9 g/dL (ref 30.0–36.0)
MCV: 100.6 fL — ABNORMAL HIGH (ref 80.0–100.0)
Monocytes Absolute: 2 10*3/uL — ABNORMAL HIGH (ref 0.1–1.0)
Monocytes Relative: 27 %
Neutro Abs: 3.5 10*3/uL (ref 1.7–7.7)
Neutrophils Relative %: 46 %
Platelets: 113 10*3/uL — ABNORMAL LOW (ref 150–400)
RBC: 3.3 MIL/uL — ABNORMAL LOW (ref 4.22–5.81)
RDW: 18.2 % — ABNORMAL HIGH (ref 11.5–15.5)
WBC: 7.5 10*3/uL (ref 4.0–10.5)
nRBC: 0 % (ref 0.0–0.2)

## 2018-10-07 DIAGNOSIS — R634 Abnormal weight loss: Secondary | ICD-10-CM | POA: Diagnosis not present

## 2018-10-07 DIAGNOSIS — D509 Iron deficiency anemia, unspecified: Secondary | ICD-10-CM | POA: Diagnosis not present

## 2018-10-07 DIAGNOSIS — K219 Gastro-esophageal reflux disease without esophagitis: Secondary | ICD-10-CM | POA: Diagnosis not present

## 2018-10-10 ENCOUNTER — Other Ambulatory Visit: Payer: Self-pay | Admitting: Gastroenterology

## 2018-10-10 DIAGNOSIS — R634 Abnormal weight loss: Secondary | ICD-10-CM

## 2018-10-10 DIAGNOSIS — D509 Iron deficiency anemia, unspecified: Secondary | ICD-10-CM

## 2018-10-13 DIAGNOSIS — L97521 Non-pressure chronic ulcer of other part of left foot limited to breakdown of skin: Secondary | ICD-10-CM | POA: Diagnosis not present

## 2018-10-15 ENCOUNTER — Inpatient Hospital Stay: Payer: PPO | Attending: Internal Medicine

## 2018-10-15 ENCOUNTER — Inpatient Hospital Stay (HOSPITAL_BASED_OUTPATIENT_CLINIC_OR_DEPARTMENT_OTHER): Payer: PPO | Admitting: Internal Medicine

## 2018-10-15 ENCOUNTER — Encounter: Payer: Self-pay | Admitting: Internal Medicine

## 2018-10-15 ENCOUNTER — Inpatient Hospital Stay: Payer: PPO

## 2018-10-15 ENCOUNTER — Other Ambulatory Visit: Payer: Self-pay

## 2018-10-15 VITALS — BP 157/69 | HR 65 | Temp 97.7°F | Resp 20 | Ht 76.0 in | Wt 200.2 lb

## 2018-10-15 DIAGNOSIS — N183 Chronic kidney disease, stage 3 unspecified: Secondary | ICD-10-CM

## 2018-10-15 DIAGNOSIS — R634 Abnormal weight loss: Secondary | ICD-10-CM | POA: Diagnosis not present

## 2018-10-15 DIAGNOSIS — I129 Hypertensive chronic kidney disease with stage 1 through stage 4 chronic kidney disease, or unspecified chronic kidney disease: Secondary | ICD-10-CM | POA: Insufficient documentation

## 2018-10-15 DIAGNOSIS — D696 Thrombocytopenia, unspecified: Secondary | ICD-10-CM

## 2018-10-15 DIAGNOSIS — Z87442 Personal history of urinary calculi: Secondary | ICD-10-CM

## 2018-10-15 DIAGNOSIS — D631 Anemia in chronic kidney disease: Secondary | ICD-10-CM | POA: Diagnosis not present

## 2018-10-15 DIAGNOSIS — Z87891 Personal history of nicotine dependence: Secondary | ICD-10-CM

## 2018-10-15 DIAGNOSIS — Z79899 Other long term (current) drug therapy: Secondary | ICD-10-CM

## 2018-10-15 DIAGNOSIS — I2510 Atherosclerotic heart disease of native coronary artery without angina pectoris: Secondary | ICD-10-CM

## 2018-10-15 DIAGNOSIS — Z800 Family history of malignant neoplasm of digestive organs: Secondary | ICD-10-CM

## 2018-10-15 DIAGNOSIS — I252 Old myocardial infarction: Secondary | ICD-10-CM | POA: Diagnosis not present

## 2018-10-15 DIAGNOSIS — I509 Heart failure, unspecified: Secondary | ICD-10-CM

## 2018-10-15 DIAGNOSIS — R05 Cough: Secondary | ICD-10-CM | POA: Diagnosis not present

## 2018-10-15 DIAGNOSIS — I429 Cardiomyopathy, unspecified: Secondary | ICD-10-CM | POA: Insufficient documentation

## 2018-10-15 DIAGNOSIS — I4891 Unspecified atrial fibrillation: Secondary | ICD-10-CM | POA: Diagnosis not present

## 2018-10-15 DIAGNOSIS — F419 Anxiety disorder, unspecified: Secondary | ICD-10-CM | POA: Insufficient documentation

## 2018-10-15 DIAGNOSIS — D500 Iron deficiency anemia secondary to blood loss (chronic): Secondary | ICD-10-CM

## 2018-10-15 LAB — CBC WITH DIFFERENTIAL/PLATELET
Abs Immature Granulocytes: 0.06 10*3/uL (ref 0.00–0.07)
Basophils Absolute: 0 10*3/uL (ref 0.0–0.1)
Basophils Relative: 0 %
Eosinophils Absolute: 0.1 10*3/uL (ref 0.0–0.5)
Eosinophils Relative: 1 %
HCT: 28.8 % — ABNORMAL LOW (ref 39.0–52.0)
Hemoglobin: 9.4 g/dL — ABNORMAL LOW (ref 13.0–17.0)
Immature Granulocytes: 1 %
Lymphocytes Relative: 21 %
Lymphs Abs: 1.5 10*3/uL (ref 0.7–4.0)
MCH: 32.6 pg (ref 26.0–34.0)
MCHC: 32.6 g/dL (ref 30.0–36.0)
MCV: 100 fL (ref 80.0–100.0)
Monocytes Absolute: 1.8 10*3/uL — ABNORMAL HIGH (ref 0.1–1.0)
Monocytes Relative: 26 %
Neutro Abs: 3.5 10*3/uL (ref 1.7–7.7)
Neutrophils Relative %: 51 %
Platelets: 140 10*3/uL — ABNORMAL LOW (ref 150–400)
RBC: 2.88 MIL/uL — ABNORMAL LOW (ref 4.22–5.81)
RDW: 17.7 % — ABNORMAL HIGH (ref 11.5–15.5)
WBC: 7 10*3/uL (ref 4.0–10.5)
nRBC: 0 % (ref 0.0–0.2)

## 2018-10-15 LAB — BASIC METABOLIC PANEL
Anion gap: 9 (ref 5–15)
BUN: 27 mg/dL — ABNORMAL HIGH (ref 8–23)
CO2: 22 mmol/L (ref 22–32)
Calcium: 9.2 mg/dL (ref 8.9–10.3)
Chloride: 108 mmol/L (ref 98–111)
Creatinine, Ser: 1.59 mg/dL — ABNORMAL HIGH (ref 0.61–1.24)
GFR calc Af Amer: 45 mL/min — ABNORMAL LOW (ref >60)
GFR calc non Af Amer: 39 mL/min — ABNORMAL LOW (ref >60)
Glucose, Bld: 117 mg/dL — ABNORMAL HIGH (ref 70–99)
Potassium: 4.4 mmol/L (ref 3.5–5.1)
Sodium: 139 mmol/L (ref 135–145)

## 2018-10-15 MED ORDER — DARBEPOETIN ALFA 300 MCG/0.6ML IJ SOSY
300.0000 ug | PREFILLED_SYRINGE | Freq: Once | INTRAMUSCULAR | Status: AC
  Administered 2018-10-15: 300 ug via SUBCUTANEOUS
  Filled 2018-10-15: qty 0.6

## 2018-10-15 NOTE — Progress Notes (Signed)
[place] NOTE  Patient Care Team: Rusty Aus, MD as PCP - General (Internal Medicine) Alisa Graff, FNP as Nurse Practitioner (Family Medicine) Ubaldo Glassing Javier Docker, MD as Consulting Physician (Cardiology) Erby Pian, MD as Referring Physician (Specialist)  CHIEF COMPLAINTS/PURPOSE OF CONSULTATION:   # 2012- Anemia 11-12- sec to CKD/IDA? MDS [no BMBx] [March 2017-sat-11%; ferritin-42] [Feb 2017- EGD/colo-July 2015 Dr.Skulskie];April 2017-PO Iron PO; May 2020-IV iron/Aranesp.  # Intermittent thrombocytopenia 120s-140s [Dec 2016-CT- neg cirrhosis/splenomegaly]  # CKD [creatinine 1.2- 1.4]; CAD/CHF  HISTORY OF PRESENTING ILLNESS:  Andres Gutierrez 85 y.o.  male history of above history of chronic anemia and intermittent thrombocytopenia is here for follow-up.  Patient is currently on IV iron and also Aranesp.  Patient denies any blood in stools or black or stools P denies any nausea vomiting.  No fevers or chills.  Patient is concerned about weight loss.  States appetite is fair.  Review of Systems  Constitutional: Positive for malaise/fatigue. Negative for chills, diaphoresis, fever and weight loss.  HENT: Negative for nosebleeds and sore throat.   Eyes: Negative for double vision.  Respiratory: Negative for cough, hemoptysis, sputum production, shortness of breath and wheezing.   Cardiovascular: Negative for chest pain, palpitations, orthopnea and leg swelling.  Gastrointestinal: Negative for abdominal pain, blood in stool, constipation, diarrhea, heartburn, melena, nausea and vomiting.  Genitourinary: Negative for dysuria, frequency and urgency.  Musculoskeletal: Positive for back pain and joint pain.  Skin: Negative.  Negative for itching and rash.  Neurological: Negative for dizziness, tingling, focal weakness, weakness and headaches.  Endo/Heme/Allergies: Does not bruise/bleed easily.  Psychiatric/Behavioral: Negative for depression. The patient  is not nervous/anxious and does not have insomnia.      MEDICAL HISTORY:  Past Medical History:  Diagnosis Date  . Anemia   . Anxiety   . Atrial fibrillation ([place])   . Cardiomyopathy ([place])   . CHF (congestive heart failure) ([place])   . Chronic kidney disease    kidney stones  . Coronary artery disease   . Cough   . Dysrhythmia   . Hypertension   . Lymphadenopathy, hilar   . MI (myocardial infarction) ([place])    x 2  . Wheezing     SURGICAL HISTORY: Past Surgical History:  Procedure Laterality Date  . BRONCHIAL NEEDLE ASPIRATION BIOPSY N/A 12/31/2014   Procedure: BRONCHIAL NEEDLE ASPIRATION BIOPSIES from carina;  Surgeon: Flora Lipps, MD;  Location: [facility] ORS;  Service: Cardiopulmonary;  Laterality: N/A;  . CARDIAC CATHETERIZATION N/A 12/28/2015   Procedure: Left Heart Cath and Cors/Grafts Angiography;  Surgeon: Yolonda Kida, MD;  Location: [place] [facility];  Service: Cardiovascular;  Laterality: N/A;  . CATARACT EXTRACTION W/PHACO Left 08/01/2016   Procedure: CATARACT EXTRACTION PHACO AND INTRAOCULAR LENS PLACEMENT ([place]) Left;  Surgeon: Leandrew Koyanagi, MD;  Location: [place];  Service: Ophthalmology;  Laterality: Left;  . CORONARY ANGIOPLASTY WITH STENT PLACEMENT    . CORONARY ARTERY BYPASS GRAFT    . ENDOBRONCHIAL ULTRASOUND N/A 12/31/2014   Procedure: ENDOBRONCHIAL ULTRASOUND;  Surgeon: Flora Lipps, MD;  Location: [facility];  Service: Cardiopulmonary;  Laterality: N/A;  . ESOPHAGOGASTRODUODENOSCOPY (EGD) WITH PROPOFOL N/A 06/20/2015   Procedure: ESOPHAGOGASTRODUODENOSCOPY (EGD) WITH PROPOFOL;  Surgeon: Lollie Sails, MD;  Location: [facility] ENDOSCOPY;  Service: Endoscopy;  Laterality: N/A;    SOCIAL HISTORY: lives in [place]; alone. 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  Social History   Socioeconomic History  . Marital status: Widowed    Spouse name:  Not on file  . Number of children: 2  . Years of education: college  . Highest education level: Some college, no  degree  Occupational History  . Occupation: retired  Scientific laboratory technician  . Financial resource strain: Not hard at all  . Food insecurity:    Worry: Never true    Inability: Never true  . Transportation needs:    Medical: No    Non-medical: No  Tobacco Use  . Smoking status: Former Smoker    Last attempt to quit: 06/27/1977    Years since quitting: 41.3  . Smokeless tobacco: Current User    Types: Chew  Substance and Sexual Activity  . Alcohol use: Yes    Comment: occational almost rare once a year  . Drug use: No  . Sexual activity: Never    Birth control/protection: Abstinence  Lifestyle  . Physical activity:    Days per week: 7 days    Minutes per session: 30 min  . Stress: Not at all  Relationships  . Social connections:    Talks on phone: More than three times a week    Gets together: More than three times a week    Attends religious service: More than 4 times per year    Active member of club or organization: No    Attends meetings of clubs or organizations: Never    Relationship status: Widowed  . Intimate partner violence:    Fear of current or ex partner: Patient refused    Emotionally abused: Patient refused    Physically abused: Patient refused    Forced sexual activity: Patient refused  Other Topics Concern  . Not on file  Social History Narrative  . Not on file    FAMILY HISTORY: Family History  Problem Relation Age of Onset  . CAD Mother   . Colon cancer Father     ALLERGIES:  is allergic to 5ht3 receptor antagonists; diphenhydramine hcl; escitalopram; maxidex [dexamethasone]; prednisone; serotonin; vytorin [ezetimibe-simvastatin]; zocor [simvastatin]; and diltiazem.  MEDICATIONS:  Current Outpatient Medications  Medication Sig Dispense Refill  . acetaminophen (TYLENOL) 500 MG tablet Take 500 mg by mouth every 6 (six) hours as needed for mild pain or moderate pain.    Marland Kitchen atorvastatin (LIPITOR) 40 MG tablet Take 1 tablet (40 mg total) by mouth daily at  6 PM. 30 tablet 0  . digoxin (LANOXIN) 0.125 MG tablet Take 0.0625 mg by mouth daily.    Marland Kitchen docusate sodium (COLACE) 100 MG capsule Take 100 mg by mouth daily as needed.     . ferrous sulfate 325 (65 FE) MG tablet Take 1 tablet (325 mg total) by mouth daily. (Patient taking differently: Take 325 mg by mouth daily with breakfast. ) 30 tablet 3  . furosemide (LASIX) 20 MG tablet Take 40 mg by mouth as needed. For weight gain greater than 2 pounds in 24 hours or greater than 5 pounds in one week    . metoprolol tartrate (LOPRESSOR) 12.5 mg TABS tablet Take 12.5 mg by mouth 2 (two) times daily.    . pantoprazole (PROTONIX) 40 MG tablet Take 40 mg by mouth 2 (two) times daily before a meal.     . ranolazine (RANEXA) 500 MG 12 hr tablet Take 1 tablet (500 mg total) by mouth 2 (two) times daily. 60 tablet 0  . sucralfate (CARAFATE) 1 G tablet Take 1 g by mouth 4 (four) times daily.     . tamsulosin (FLOMAX) 0.4 MG CAPS capsule Take 0.4  mg by mouth daily after supper.     . vitamin C (VITAMIN C) 250 MG tablet Take 1 tablet (250 mg total) by mouth daily. 30 tablet 0  . nitroGLYCERIN (NITROSTAT) 0.4 MG SL tablet Place 0.4 mg under the tongue every 5 (five) minutes as needed for chest pain.     No current facility-administered medications for this visit.      PHYSICAL EXAMINATION: ECOG PERFORMANCE STATUS: 0 - Asymptomatic  Vitals:   10/15/18 1038  BP: (!) 157/69  Pulse: 65  Resp: 20  Temp: 97.7 F (36.5 C)   Filed Weights   10/15/18 1038  Weight: 200 lb 3.2 oz (90.8 kg)    Physical Exam  Constitutional: He is oriented to person, place, and time.  Elderly Caucasian male patient. He is alone.  Walking by himself.  HENT:  Head: Normocephalic and atraumatic.  Mouth/Throat: Oropharynx is clear and moist. No oropharyngeal exudate.  Eyes: Pupils are equal, round, and reactive to light.  Neck: Normal range of motion. Neck supple.  Cardiovascular: Normal rate and regular rhythm.  Pulmonary/Chest:  Breath sounds normal. No respiratory distress. He has no wheezes.  Abdominal: Soft. Bowel sounds are normal. He exhibits no distension and no mass. There is no abdominal tenderness. There is no rebound and no guarding.  Musculoskeletal: Normal range of motion.        General: No tenderness or edema.  Neurological: He is alert and oriented to person, place, and time.  Skin: Skin is warm. There is pallor.  Psychiatric: Affect normal.   .   LABORATORY DATA:  I have reviewed the data as listed Lab Results  Component Value Date   WBC 7.0 10/15/2018   HGB 9.4 (L) 10/15/2018   HCT 28.8 (L) 10/15/2018   MCV 100.0 10/15/2018   PLT 140 (L) 10/15/2018   Recent Labs    12/19/17 0808  08/19/18 1030 09/16/18 0719 10/15/18 1003  NA 138   < > 137 137 139  K 4.5   < > 4.0 4.5 4.4  CL 106   < > 107 107 108  CO2 20*   < > 20* 21* 22  GLUCOSE 102*   < > 109* 101* 117*  BUN 28*   < > 23 30* 27*  CREATININE 1.77*   < > 1.72* 1.69* 1.59*  CALCIUM 8.9   < > 8.9 9.2 9.2  GFRNONAA 34*   < > 35* 36* 39*  GFRAA 39*   < > 41* 42* 45*  PROT 8.1  --   --   --   --   ALBUMIN 4.5  --   --   --   --   AST 26  --   --   --   --   ALT 16  --   --   --   --   ALKPHOS 67  --   --   --   --   BILITOT 0.5  --   --   --   --    < > = values in this interval not displayed.   ASSESSMENT & PLAN:   Anemia of chronic kidney failure, stage 3 (moderate) (HCC) # Anemia mild hemoglobin likely since 2012.  Suspect MDS/chronic kidney disease.   #Hemoglobin 9.4;  Proceed with aranesp today; and thereafter every 2 to 3 weeks.  IV iron as needed.  #Mild intermittent thrombocytopenia- 142;  platelets improved  # CKD Stage III- creat 1.7 stable.   # weight  loss-unclear etiology.  Patient has CT scans ordered for June 8th; will await results.  If negative for any malignancy would refer to nutrition.  #I spoke to patient's son over the phone regarding above plan.  He agrees.  #Disposition: # aranesp today # in 2  weeks- H&H- aranesp # in july 2nd week MD- cbc/bmp-possible aranesp-Dr.B       Cammie Sickle, MD 10/15/2018 1:07 PM

## 2018-10-15 NOTE — Assessment & Plan Note (Addendum)
#   Anemia mild hemoglobin likely since 2012.  Suspect MDS/chronic kidney disease.   #Hemoglobin 9.4;  Proceed with aranesp today; and thereafter every 2 to 3 weeks.  IV iron as needed.  #Mild intermittent thrombocytopenia- 142;  platelets improved  # CKD Stage III- creat 1.7 stable.   # weight loss-unclear etiology.  Patient has CT scans ordered for June 8th; will await results.  If negative for any malignancy would refer to nutrition.  #I spoke to patient's son over the phone regarding above plan.  He agrees.  #Disposition: # aranesp today # in 2 weeks- H&H- aranesp # in july 2nd week MD- cbc/bmp-possible aranesp-Dr.B

## 2018-10-20 ENCOUNTER — Other Ambulatory Visit: Payer: Self-pay

## 2018-10-20 ENCOUNTER — Ambulatory Visit
Admission: RE | Admit: 2018-10-20 | Discharge: 2018-10-20 | Disposition: A | Discharge status: Home / Self Care | Payer: PPO | Source: Ambulatory Visit | Attending: Gastroenterology | Admitting: Gastroenterology

## 2018-10-20 DIAGNOSIS — J90 Pleural effusion, not elsewhere classified: Secondary | ICD-10-CM | POA: Diagnosis not present

## 2018-10-20 DIAGNOSIS — D696 Thrombocytopenia, unspecified: Secondary | ICD-10-CM | POA: Diagnosis not present

## 2018-10-20 DIAGNOSIS — R918 Other nonspecific abnormal finding of lung field: Secondary | ICD-10-CM | POA: Insufficient documentation

## 2018-10-20 DIAGNOSIS — J929 Pleural plaque without asbestos: Secondary | ICD-10-CM | POA: Diagnosis not present

## 2018-10-20 DIAGNOSIS — I700 Atherosclerosis of aorta: Secondary | ICD-10-CM | POA: Insufficient documentation

## 2018-10-20 DIAGNOSIS — N200 Calculus of kidney: Secondary | ICD-10-CM | POA: Insufficient documentation

## 2018-10-20 DIAGNOSIS — D509 Iron deficiency anemia, unspecified: Secondary | ICD-10-CM | POA: Insufficient documentation

## 2018-10-20 DIAGNOSIS — Z6824 Body mass index (BMI) 24.0-24.9, adult: Secondary | ICD-10-CM | POA: Insufficient documentation

## 2018-10-20 DIAGNOSIS — R634 Abnormal weight loss: Secondary | ICD-10-CM | POA: Insufficient documentation

## 2018-10-28 ENCOUNTER — Other Ambulatory Visit: Payer: Self-pay

## 2018-10-29 ENCOUNTER — Other Ambulatory Visit: Payer: Self-pay

## 2018-10-29 ENCOUNTER — Inpatient Hospital Stay: Payer: PPO

## 2018-10-29 DIAGNOSIS — D631 Anemia in chronic kidney disease: Secondary | ICD-10-CM

## 2018-10-29 DIAGNOSIS — N183 Chronic kidney disease, stage 3 unspecified: Secondary | ICD-10-CM

## 2018-10-29 DIAGNOSIS — I129 Hypertensive chronic kidney disease with stage 1 through stage 4 chronic kidney disease, or unspecified chronic kidney disease: Secondary | ICD-10-CM | POA: Diagnosis not present

## 2018-10-29 LAB — HEMATOCRIT: HCT: 31.8 % — ABNORMAL LOW (ref 39.0–52.0)

## 2018-10-29 LAB — HEMOGLOBIN: Hemoglobin: 10.1 g/dL — ABNORMAL LOW (ref 13.0–17.0)

## 2018-11-03 DIAGNOSIS — L97521 Non-pressure chronic ulcer of other part of left foot limited to breakdown of skin: Secondary | ICD-10-CM | POA: Diagnosis not present

## 2018-11-03 DIAGNOSIS — L97512 Non-pressure chronic ulcer of other part of right foot with fat layer exposed: Secondary | ICD-10-CM | POA: Diagnosis not present

## 2018-11-03 DIAGNOSIS — G609 Hereditary and idiopathic neuropathy, unspecified: Secondary | ICD-10-CM | POA: Diagnosis not present

## 2018-11-11 ENCOUNTER — Other Ambulatory Visit: Payer: Self-pay

## 2018-11-12 ENCOUNTER — Other Ambulatory Visit: Payer: Self-pay

## 2018-11-12 ENCOUNTER — Inpatient Hospital Stay (HOSPITAL_BASED_OUTPATIENT_CLINIC_OR_DEPARTMENT_OTHER): Payer: PPO | Admitting: Internal Medicine

## 2018-11-12 ENCOUNTER — Inpatient Hospital Stay: Payer: PPO | Attending: Internal Medicine

## 2018-11-12 ENCOUNTER — Inpatient Hospital Stay: Payer: PPO

## 2018-11-12 DIAGNOSIS — I252 Old myocardial infarction: Secondary | ICD-10-CM | POA: Diagnosis not present

## 2018-11-12 DIAGNOSIS — I10 Essential (primary) hypertension: Secondary | ICD-10-CM

## 2018-11-12 DIAGNOSIS — D696 Thrombocytopenia, unspecified: Secondary | ICD-10-CM | POA: Diagnosis not present

## 2018-11-12 DIAGNOSIS — Z87891 Personal history of nicotine dependence: Secondary | ICD-10-CM | POA: Insufficient documentation

## 2018-11-12 DIAGNOSIS — D631 Anemia in chronic kidney disease: Secondary | ICD-10-CM

## 2018-11-12 DIAGNOSIS — D500 Iron deficiency anemia secondary to blood loss (chronic): Secondary | ICD-10-CM

## 2018-11-12 DIAGNOSIS — I2510 Atherosclerotic heart disease of native coronary artery without angina pectoris: Secondary | ICD-10-CM | POA: Insufficient documentation

## 2018-11-12 DIAGNOSIS — R918 Other nonspecific abnormal finding of lung field: Secondary | ICD-10-CM | POA: Insufficient documentation

## 2018-11-12 DIAGNOSIS — I130 Hypertensive heart and chronic kidney disease with heart failure and stage 1 through stage 4 chronic kidney disease, or unspecified chronic kidney disease: Secondary | ICD-10-CM | POA: Diagnosis not present

## 2018-11-12 DIAGNOSIS — I509 Heart failure, unspecified: Secondary | ICD-10-CM

## 2018-11-12 DIAGNOSIS — R5383 Other fatigue: Secondary | ICD-10-CM | POA: Insufficient documentation

## 2018-11-12 DIAGNOSIS — N183 Chronic kidney disease, stage 3 unspecified: Secondary | ICD-10-CM

## 2018-11-12 DIAGNOSIS — F419 Anxiety disorder, unspecified: Secondary | ICD-10-CM | POA: Diagnosis not present

## 2018-11-12 DIAGNOSIS — I429 Cardiomyopathy, unspecified: Secondary | ICD-10-CM | POA: Diagnosis not present

## 2018-11-12 DIAGNOSIS — I4891 Unspecified atrial fibrillation: Secondary | ICD-10-CM | POA: Insufficient documentation

## 2018-11-12 DIAGNOSIS — Z79899 Other long term (current) drug therapy: Secondary | ICD-10-CM | POA: Diagnosis not present

## 2018-11-12 LAB — CBC
HCT: 30.6 % — ABNORMAL LOW (ref 39.0–52.0)
Hemoglobin: 9.9 g/dL — ABNORMAL LOW (ref 13.0–17.0)
MCH: 32.6 pg (ref 26.0–34.0)
MCHC: 32.4 g/dL (ref 30.0–36.0)
MCV: 100.7 fL — ABNORMAL HIGH (ref 80.0–100.0)
Platelets: 152 10*3/uL (ref 150–400)
RBC: 3.04 MIL/uL — ABNORMAL LOW (ref 4.22–5.81)
RDW: 17.1 % — ABNORMAL HIGH (ref 11.5–15.5)
WBC: 8.4 10*3/uL (ref 4.0–10.5)
nRBC: 0 % (ref 0.0–0.2)

## 2018-11-12 LAB — BASIC METABOLIC PANEL
Anion gap: 13 (ref 5–15)
BUN: 31 mg/dL — ABNORMAL HIGH (ref 8–23)
CO2: 18 mmol/L — ABNORMAL LOW (ref 22–32)
Calcium: 8.9 mg/dL (ref 8.9–10.3)
Chloride: 106 mmol/L (ref 98–111)
Creatinine, Ser: 1.79 mg/dL — ABNORMAL HIGH (ref 0.61–1.24)
GFR calc Af Amer: 39 mL/min — ABNORMAL LOW (ref >60)
GFR calc non Af Amer: 34 mL/min — ABNORMAL LOW (ref >60)
Glucose, Bld: 122 mg/dL — ABNORMAL HIGH (ref 70–99)
Potassium: 4 mmol/L (ref 3.5–5.1)
Sodium: 137 mmol/L (ref 135–145)

## 2018-11-12 MED ORDER — DARBEPOETIN ALFA 300 MCG/0.6ML IJ SOSY
300.0000 ug | PREFILLED_SYRINGE | Freq: Once | INTRAMUSCULAR | Status: AC
  Administered 2018-11-12: 11:00:00 300 ug via SUBCUTANEOUS
  Filled 2018-11-12: qty 0.6

## 2018-11-12 NOTE — Progress Notes (Signed)
Patient does not offer any problems today. Had CT on 10/20/2018

## 2018-11-12 NOTE — Progress Notes (Signed)
[place] NOTE  Patient Care Team: Rusty Aus, MD as PCP - General (Internal Medicine) Alisa Graff, FNP as Nurse Practitioner (Family Medicine) Ubaldo Glassing Javier Docker, MD as Consulting Physician (Cardiology) Erby Pian, MD as Referring Physician (Specialist)  CHIEF COMPLAINTS/PURPOSE OF CONSULTATION:   # 2012- Anemia 11-12- sec to CKD/IDA? MDS [no BMBx] [March 2017-sat-11%; ferritin-42] [Feb 2017- EGD/colo-July 2015 Dr.Skulskie];April 2017-PO Iron PO; May 2020-IV iron/Aranesp.  # Intermittent thrombocytopenia 120s-140s [Dec 2016-CT- neg cirrhosis/splenomegaly]  #June 2020 -LUL cystic lesion- 4x4cm ?  Question etiology [slightly increased from 2017]; question postinflammatory versus low-grade adenocarcinoma-repeat scan in 6 months [December 2020]  # CKD [creatinine 1.2- 1.4]; CAD/CHF  HISTORY OF PRESENTING ILLNESS:  Andres Gutierrez 85 y.o.  male history of above history of chronic anemia and intermittent thrombocytopenia is here for follow-up.  Patient is currently on IV iron and also Aranesp.  Patient denies any blood in stools or black or stools.  Denies any nausea vomiting.  Appetite is fair.  His weight is stable.  Review of Systems  Constitutional: Positive for malaise/fatigue. Negative for chills, diaphoresis, fever and weight loss.  HENT: Negative for nosebleeds and sore throat.   Eyes: Negative for double vision.  Respiratory: Negative for cough, hemoptysis, sputum production, shortness of breath and wheezing.   Cardiovascular: Negative for chest pain, palpitations, orthopnea and leg swelling.  Gastrointestinal: Negative for abdominal pain, blood in stool, constipation, diarrhea, heartburn, melena, nausea and vomiting.  Genitourinary: Negative for dysuria, frequency and urgency.  Musculoskeletal: Positive for back pain and joint pain.  Skin: Negative.  Negative for itching and rash.  Neurological: Negative for dizziness, tingling, focal  weakness, weakness and headaches.  Endo/Heme/Allergies: Does not bruise/bleed easily.  Psychiatric/Behavioral: Negative for depression. The patient is not nervous/anxious and does not have insomnia.      MEDICAL HISTORY:  Past Medical History:  Diagnosis Date  . Anemia   . Anxiety   . Atrial fibrillation ([place])   . Cardiomyopathy ([place])   . CHF (congestive heart failure) ([place])   . Chronic kidney disease    kidney stones  . Coronary artery disease   . Cough   . Dysrhythmia   . Hypertension   . Lymphadenopathy, hilar   . MI (myocardial infarction) ([place])    x 2  . Wheezing     SURGICAL HISTORY: Past Surgical History:  Procedure Laterality Date  . BRONCHIAL NEEDLE ASPIRATION BIOPSY N/A 12/31/2014   Procedure: BRONCHIAL NEEDLE ASPIRATION BIOPSIES from carina;  Surgeon: Flora Lipps, MD;  Location: [facility] ORS;  Service: Cardiopulmonary;  Laterality: N/A;  . CARDIAC CATHETERIZATION N/A 12/28/2015   Procedure: Left Heart Cath and Cors/Grafts Angiography;  Surgeon: Yolonda Kida, MD;  Location: [place] [facility];  Service: Cardiovascular;  Laterality: N/A;  . CATARACT EXTRACTION W/PHACO Left 08/01/2016   Procedure: CATARACT EXTRACTION PHACO AND INTRAOCULAR LENS PLACEMENT ([place]) Left;  Surgeon: Leandrew Koyanagi, MD;  Location: [place];  Service: Ophthalmology;  Laterality: Left;  . CORONARY ANGIOPLASTY WITH STENT PLACEMENT    . CORONARY ARTERY BYPASS GRAFT    . ENDOBRONCHIAL ULTRASOUND N/A 12/31/2014   Procedure: ENDOBRONCHIAL ULTRASOUND;  Surgeon: Flora Lipps, MD;  Location: [facility] ORS;  Service: Cardiopulmonary;  Laterality: N/A;  . ESOPHAGOGASTRODUODENOSCOPY (EGD) WITH PROPOFOL N/A 06/20/2015   Procedure: ESOPHAGOGASTRODUODENOSCOPY (EGD) WITH PROPOFOL;  Surgeon: Lollie Sails, MD;  Location: [facility] ENDOSCOPY;  Service: Endoscopy;  Laterality: N/A;    SOCIAL HISTORY: lives in [place]; alone. Used to work in  printing.  Social History   Socioeconomic History  . Marital  status: Widowed    Spouse name: Not on file  . Number of children: 2  . Years of education: college  . Highest education level: Some college, no degree  Occupational History  . Occupation: retired  Scientific laboratory technician  . Financial resource strain: Not hard at all  . Food insecurity    Worry: Never true    Inability: Never true  . Transportation needs    Medical: No    Non-medical: No  Tobacco Use  . Smoking status: Former Smoker    Quit date: 06/27/1977    Years since quitting: 41.4  . Smokeless tobacco: Current User    Types: Chew  Substance and Sexual Activity  . Alcohol use: Yes    Comment: occational almost rare once a year  . Drug use: No  . Sexual activity: Never    Birth control/protection: Abstinence  Lifestyle  . Physical activity    Days per week: 7 days    Minutes per session: 30 min  . Stress: Not at all  Relationships  . Social connections    Talks on phone: More than three times a week    Gets together: More than three times a week    Attends religious service: More than 4 times per year    Active member of club or organization: No    Attends meetings of clubs or organizations: Never    Relationship status: Widowed  . Intimate partner violence    Fear of current or ex partner: Patient refused    Emotionally abused: Patient refused    Physically abused: Patient refused    Forced sexual activity: Patient refused  Other Topics Concern  . Not on file  Social History Narrative  . Not on file    FAMILY HISTORY: Family History  Problem Relation Age of Onset  . CAD Mother   . Colon cancer Father     ALLERGIES:  is allergic to 5ht3 receptor antagonists; diphenhydramine hcl; escitalopram; maxidex [dexamethasone]; prednisone; serotonin; vytorin [ezetimibe-simvastatin]; zocor [simvastatin]; and diltiazem.  MEDICATIONS:  Current Outpatient Medications  Medication Sig Dispense Refill  . acetaminophen (TYLENOL) 500 MG tablet Take 500 mg by mouth every 6 (six)  hours as needed for mild pain or moderate pain.    Marland Kitchen atorvastatin (LIPITOR) 40 MG tablet Take 1 tablet (40 mg total) by mouth daily at 6 PM. 30 tablet 0  . digoxin (LANOXIN) 0.125 MG tablet Take 0.0625 mg by mouth daily.    Marland Kitchen docusate sodium (COLACE) 100 MG capsule Take 100 mg by mouth daily as needed.     . ferrous sulfate 325 (65 FE) MG tablet Take 1 tablet (325 mg total) by mouth daily. (Patient taking differently: Take 325 mg by mouth daily with breakfast. ) 30 tablet 3  . furosemide (LASIX) 20 MG tablet Take 40 mg by mouth as needed. For weight gain greater than 2 pounds in 24 hours or greater than 5 pounds in one week    . metoprolol tartrate (LOPRESSOR) 12.5 mg TABS tablet Take 12.5 mg by mouth 2 (two) times daily.    . nitroGLYCERIN (NITROSTAT) 0.4 MG SL tablet Place 0.4 mg under the tongue every 5 (five) minutes as needed for chest pain.    . pantoprazole (PROTONIX) 40 MG tablet Take 40 mg by mouth 2 (two) times daily before a meal.     . ranolazine (RANEXA) 500 MG 12 hr tablet Take 1 tablet (  500 mg total) by mouth 2 (two) times daily. 60 tablet 0  . sucralfate (CARAFATE) 1 G tablet Take 1 g by mouth 4 (four) times daily.     . tamsulosin (FLOMAX) 0.4 MG CAPS capsule Take 0.4 mg by mouth daily after supper.     . vitamin C (VITAMIN C) 250 MG tablet Take 1 tablet (250 mg total) by mouth daily. 30 tablet 0   No current facility-administered medications for this visit.      PHYSICAL EXAMINATION: ECOG PERFORMANCE STATUS: 0 - Asymptomatic  Vitals:   11/12/18 1023  BP: (!) 147/67  Pulse: 87  Resp: 18  Temp: 98 F (36.7 C)   Filed Weights   11/12/18 1023  Weight: 197 lb 3.2 oz (89.4 kg)    Physical Exam  Constitutional: He is oriented to person, place, and time.  Elderly Caucasian male patient. He is alone.  Walking by with a walker.  HENT:  Head: Normocephalic and atraumatic.  Mouth/Throat: Oropharynx is clear and moist. No oropharyngeal exudate.  Eyes: Pupils are equal,  round, and reactive to light.  Neck: Normal range of motion. Neck supple.  Cardiovascular: Normal rate and regular rhythm.  Pulmonary/Chest: Breath sounds normal. No respiratory distress. He has no wheezes.  Abdominal: Soft. Bowel sounds are normal. He exhibits no distension and no mass. There is no abdominal tenderness. There is no rebound and no guarding.  Musculoskeletal: Normal range of motion.        General: No tenderness or edema.  Neurological: He is alert and oriented to person, place, and time.  Skin: Skin is warm.  Psychiatric: Affect normal.   .   LABORATORY DATA:  I have reviewed the data as listed Lab Results  Component Value Date   WBC 8.4 11/12/2018   HGB 9.9 (L) 11/12/2018   HCT 30.6 (L) 11/12/2018   MCV 100.7 (H) 11/12/2018   PLT 152 11/12/2018   Recent Labs    12/19/17 0808  09/16/18 0719 10/15/18 1003 11/12/18 1003  NA 138   < > 137 139 137  K 4.5   < > 4.5 4.4 4.0  CL 106   < > 107 108 106  CO2 20*   < > 21* 22 18*  GLUCOSE 102*   < > 101* 117* 122*  BUN 28*   < > 30* 27* 31*  CREATININE 1.77*   < > 1.69* 1.59* 1.79*  CALCIUM 8.9   < > 9.2 9.2 8.9  GFRNONAA 34*   < > 36* 39* 34*  GFRAA 39*   < > 42* 45* 39*  PROT 8.1  --   --   --   --   ALBUMIN 4.5  --   --   --   --   AST 26  --   --   --   --   ALT 16  --   --   --   --   ALKPHOS 67  --   --   --   --   BILITOT 0.5  --   --   --   --    < > = values in this interval not displayed.   ASSESSMENT & PLAN:   Anemia of chronic kidney failure, stage 3 (moderate) (HCC) # Anemia mild hemoglobin likely since 2012.  Suspect MDS/chronic kidney disease.   #Hemoglobin 9.9 proceed with aranesp today; and thereafter every 2 to 3 weeks.  Continue IV iron as needed.  #Mild intermittent thrombocytopenia-120s to  150s.  Stable  # CKD Stage III- creat 1.7 stable  # weight loss-unclear etiology; currently stable.  CT scan shows no obvious evidence of malignancy to explain his weight loss.;  Incidental  finding of left upper lobe cystic lesion noted-postinflammatory versus low-grade malignancy.  Recommend repeat imaging in 6 months.  #I spoke to patient's daughter- susan  over the phone regarding above plan.    #Disposition: # Aranesp today # in 2 weeks- H&H- Ferrahem/aranesp [separate days] # in 4 weeks MD- cbc/bmp-possible aranesp-Dr.B       Cammie Sickle, MD 11/12/2018 12:07 PM

## 2018-11-12 NOTE — Assessment & Plan Note (Addendum)
#   Anemia mild hemoglobin likely since 2012.  Suspect MDS/chronic kidney disease.   #Hemoglobin 9.9 proceed with aranesp today; and thereafter every 2 to 3 weeks.  Continue IV iron as needed.  #Mild intermittent thrombocytopenia-120s to 150s.  Stable  # CKD Stage III- creat 1.7 stable  # weight loss-unclear etiology; currently stable.  CT scan shows no obvious evidence of malignancy to explain his weight loss.;  Incidental finding of left upper lobe cystic lesion noted-postinflammatory versus low-grade malignancy.  Recommend repeat imaging in 6 months.  #I spoke to patient's daughter- susan  over the phone regarding above plan.    #Disposition: # Aranesp today # in 2 weeks- H&H- Ferrahem/aranesp [separate days] # in 4 weeks MD- cbc/bmp-possible aranesp-Dr.B

## 2018-11-19 DIAGNOSIS — I10 Essential (primary) hypertension: Secondary | ICD-10-CM | POA: Diagnosis not present

## 2018-11-19 DIAGNOSIS — I482 Chronic atrial fibrillation, unspecified: Secondary | ICD-10-CM | POA: Diagnosis not present

## 2018-11-19 DIAGNOSIS — I25810 Atherosclerosis of coronary artery bypass graft(s) without angina pectoris: Secondary | ICD-10-CM | POA: Diagnosis not present

## 2018-11-19 DIAGNOSIS — I255 Ischemic cardiomyopathy: Secondary | ICD-10-CM | POA: Diagnosis not present

## 2018-11-24 DIAGNOSIS — L97521 Non-pressure chronic ulcer of other part of left foot limited to breakdown of skin: Secondary | ICD-10-CM | POA: Diagnosis not present

## 2018-11-24 DIAGNOSIS — L97512 Non-pressure chronic ulcer of other part of right foot with fat layer exposed: Secondary | ICD-10-CM | POA: Diagnosis not present

## 2018-11-24 DIAGNOSIS — G609 Hereditary and idiopathic neuropathy, unspecified: Secondary | ICD-10-CM | POA: Diagnosis not present

## 2018-11-25 ENCOUNTER — Telehealth: Payer: Self-pay | Admitting: Internal Medicine

## 2018-11-25 ENCOUNTER — Other Ambulatory Visit: Payer: Self-pay

## 2018-11-25 NOTE — Telephone Encounter (Signed)
Spoke with pt daughter [name] to confirm appt date/time, do pre-appt screen which was completed, and adv of Covid-19 guidelines for appt regarding screening questions, temperature check, face mask required, and no visitors allowed

## 2018-11-26 ENCOUNTER — Inpatient Hospital Stay: Payer: PPO

## 2018-11-26 ENCOUNTER — Other Ambulatory Visit: Payer: Self-pay

## 2018-11-26 VITALS — BP 130/60 | HR 58 | Resp 20

## 2018-11-26 DIAGNOSIS — D631 Anemia in chronic kidney disease: Secondary | ICD-10-CM

## 2018-11-26 DIAGNOSIS — I130 Hypertensive heart and chronic kidney disease with heart failure and stage 1 through stage 4 chronic kidney disease, or unspecified chronic kidney disease: Secondary | ICD-10-CM | POA: Diagnosis not present

## 2018-11-26 DIAGNOSIS — N183 Chronic kidney disease, stage 3 unspecified: Secondary | ICD-10-CM

## 2018-11-26 DIAGNOSIS — D500 Iron deficiency anemia secondary to blood loss (chronic): Secondary | ICD-10-CM

## 2018-11-26 LAB — HEMOGLOBIN: Hemoglobin: 9.8 g/dL — ABNORMAL LOW (ref 13.0–17.0)

## 2018-11-26 LAB — HEMATOCRIT: HCT: 31 % — ABNORMAL LOW (ref 39.0–52.0)

## 2018-11-26 MED ORDER — SODIUM CHLORIDE 0.9 % IV SOLN
510.0000 mg | Freq: Once | INTRAVENOUS | Status: AC
  Administered 2018-11-26: 510 mg via INTRAVENOUS
  Filled 2018-11-26: qty 17

## 2018-11-26 MED ORDER — SODIUM CHLORIDE 0.9 % IV SOLN
Freq: Once | INTRAVENOUS | Status: AC
  Administered 2018-11-26: 14:00:00 via INTRAVENOUS
  Filled 2018-11-26: qty 250

## 2018-11-27 ENCOUNTER — Inpatient Hospital Stay: Payer: PPO

## 2018-11-27 ENCOUNTER — Other Ambulatory Visit: Payer: Self-pay

## 2018-11-27 DIAGNOSIS — I130 Hypertensive heart and chronic kidney disease with heart failure and stage 1 through stage 4 chronic kidney disease, or unspecified chronic kidney disease: Secondary | ICD-10-CM | POA: Diagnosis not present

## 2018-11-27 DIAGNOSIS — D500 Iron deficiency anemia secondary to blood loss (chronic): Secondary | ICD-10-CM

## 2018-11-27 DIAGNOSIS — N183 Chronic kidney disease, stage 3 unspecified: Secondary | ICD-10-CM

## 2018-11-27 DIAGNOSIS — D649 Anemia, unspecified: Secondary | ICD-10-CM | POA: Diagnosis not present

## 2018-11-27 DIAGNOSIS — D631 Anemia in chronic kidney disease: Secondary | ICD-10-CM

## 2018-11-27 MED ORDER — DARBEPOETIN ALFA 300 MCG/0.6ML IJ SOSY
300.0000 ug | PREFILLED_SYRINGE | Freq: Once | INTRAMUSCULAR | Status: AC
  Administered 2018-11-27: 300 ug via SUBCUTANEOUS
  Filled 2018-11-27: qty 0.6

## 2018-12-09 ENCOUNTER — Other Ambulatory Visit: Payer: Self-pay

## 2018-12-10 ENCOUNTER — Inpatient Hospital Stay: Payer: PPO

## 2018-12-10 ENCOUNTER — Other Ambulatory Visit: Payer: Self-pay

## 2018-12-10 ENCOUNTER — Encounter: Payer: Self-pay | Admitting: Internal Medicine

## 2018-12-10 ENCOUNTER — Inpatient Hospital Stay (HOSPITAL_BASED_OUTPATIENT_CLINIC_OR_DEPARTMENT_OTHER): Payer: PPO | Admitting: Internal Medicine

## 2018-12-10 DIAGNOSIS — I4891 Unspecified atrial fibrillation: Secondary | ICD-10-CM

## 2018-12-10 DIAGNOSIS — R918 Other nonspecific abnormal finding of lung field: Secondary | ICD-10-CM

## 2018-12-10 DIAGNOSIS — D696 Thrombocytopenia, unspecified: Secondary | ICD-10-CM | POA: Diagnosis not present

## 2018-12-10 DIAGNOSIS — I509 Heart failure, unspecified: Secondary | ICD-10-CM

## 2018-12-10 DIAGNOSIS — Z79899 Other long term (current) drug therapy: Secondary | ICD-10-CM | POA: Diagnosis not present

## 2018-12-10 DIAGNOSIS — I2510 Atherosclerotic heart disease of native coronary artery without angina pectoris: Secondary | ICD-10-CM

## 2018-12-10 DIAGNOSIS — D631 Anemia in chronic kidney disease: Secondary | ICD-10-CM

## 2018-12-10 DIAGNOSIS — I10 Essential (primary) hypertension: Secondary | ICD-10-CM

## 2018-12-10 DIAGNOSIS — N183 Chronic kidney disease, stage 3 unspecified: Secondary | ICD-10-CM

## 2018-12-10 DIAGNOSIS — R5383 Other fatigue: Secondary | ICD-10-CM

## 2018-12-10 DIAGNOSIS — F419 Anxiety disorder, unspecified: Secondary | ICD-10-CM

## 2018-12-10 DIAGNOSIS — I429 Cardiomyopathy, unspecified: Secondary | ICD-10-CM

## 2018-12-10 DIAGNOSIS — I252 Old myocardial infarction: Secondary | ICD-10-CM

## 2018-12-10 DIAGNOSIS — I130 Hypertensive heart and chronic kidney disease with heart failure and stage 1 through stage 4 chronic kidney disease, or unspecified chronic kidney disease: Secondary | ICD-10-CM

## 2018-12-10 DIAGNOSIS — Z87891 Personal history of nicotine dependence: Secondary | ICD-10-CM

## 2018-12-10 LAB — CBC WITH DIFFERENTIAL/PLATELET
Abs Immature Granulocytes: 0.06 10*3/uL (ref 0.00–0.07)
Basophils Absolute: 0 10*3/uL (ref 0.0–0.1)
Basophils Relative: 0 %
Eosinophils Absolute: 0.1 10*3/uL (ref 0.0–0.5)
Eosinophils Relative: 1 %
HCT: 34.8 % — ABNORMAL LOW (ref 39.0–52.0)
Hemoglobin: 10.6 g/dL — ABNORMAL LOW (ref 13.0–17.0)
Immature Granulocytes: 1 %
Lymphocytes Relative: 28 %
Lymphs Abs: 1.7 10*3/uL (ref 0.7–4.0)
MCH: 33 pg (ref 26.0–34.0)
MCHC: 30.5 g/dL (ref 30.0–36.0)
MCV: 108.4 fL — ABNORMAL HIGH (ref 80.0–100.0)
Monocytes Absolute: 1.6 10*3/uL — ABNORMAL HIGH (ref 0.1–1.0)
Monocytes Relative: 26 %
Neutro Abs: 2.7 10*3/uL (ref 1.7–7.7)
Neutrophils Relative %: 44 %
Platelets: 140 10*3/uL — ABNORMAL LOW (ref 150–400)
RBC: 3.21 MIL/uL — ABNORMAL LOW (ref 4.22–5.81)
RDW: 17.8 % — ABNORMAL HIGH (ref 11.5–15.5)
WBC: 6.1 10*3/uL (ref 4.0–10.5)
nRBC: 0 % (ref 0.0–0.2)

## 2018-12-10 LAB — BASIC METABOLIC PANEL
Anion gap: 5 (ref 5–15)
BUN: 27 mg/dL — ABNORMAL HIGH (ref 8–23)
CO2: 21 mmol/L — ABNORMAL LOW (ref 22–32)
Calcium: 9.2 mg/dL (ref 8.9–10.3)
Chloride: 110 mmol/L (ref 98–111)
Creatinine, Ser: 1.58 mg/dL — ABNORMAL HIGH (ref 0.61–1.24)
GFR calc Af Amer: 46 mL/min — ABNORMAL LOW (ref >60)
GFR calc non Af Amer: 39 mL/min — ABNORMAL LOW (ref >60)
Glucose, Bld: 113 mg/dL — ABNORMAL HIGH (ref 70–99)
Potassium: 4.8 mmol/L (ref 3.5–5.1)
Sodium: 136 mmol/L (ref 135–145)

## 2018-12-10 NOTE — Progress Notes (Signed)
[facility] NOTE  Patient Care Team: Rusty Aus, MD as PCP - General (Internal Medicine) Alisa Graff, FNP as Nurse Practitioner (Family Medicine) Ubaldo Glassing Javier Docker, MD as Consulting Physician (Cardiology) Erby Pian, MD as Referring Physician (Specialist)  CHIEF COMPLAINTS/PURPOSE OF CONSULTATION:   # 2012- Anemia 11-12- sec to CKD/IDA? MDS [no BMBx] [March 2017-sat-11%; ferritin-42] [Feb 2017- EGD/colo-July 2015 Dr.Skulskie];April 2017-PO Iron PO; May 2020-IV iron/Aranesp.  # Intermittent thrombocytopenia 120s-140s [Dec 2016-CT- neg cirrhosis/splenomegaly]  #June 2020 -LUL cystic lesion- 4x4cm ?  Question etiology [slightly increased from 2017]; question postinflammatory versus low-grade adenocarcinoma-repeat scan in 6 months [December 2020]  # CKD [creatinine 1.2- 1.4]; CAD/CHF  HISTORY OF PRESENTING ILLNESS:  Andres Gutierrez 85 y.o.  male history of above history of chronic anemia and intermittent thrombocytopenia is here for follow-up/ currently on IV iron and also Aranesp.  Patient denies any blood in stools or black or stools.  Mild to moderate fatigue.  Not any worse.  No nausea no vomiting.  His weight is stable.  No night sweats.  Review of Systems  Constitutional: Positive for malaise/fatigue. Negative for chills, diaphoresis, fever and weight loss.  HENT: Negative for nosebleeds and sore throat.   Eyes: Negative for double vision.  Respiratory: Negative for cough, hemoptysis, sputum production, shortness of breath and wheezing.   Cardiovascular: Negative for chest pain, palpitations, orthopnea and leg swelling.  Gastrointestinal: Negative for abdominal pain, blood in stool, constipation, diarrhea, heartburn, melena, nausea and vomiting.  Genitourinary: Negative for dysuria, frequency and urgency.  Musculoskeletal: Positive for back pain and joint pain.  Skin: Negative.  Negative for itching and rash.  Neurological: Negative for  dizziness, tingling, focal weakness, weakness and headaches.  Endo/Heme/Allergies: Does not bruise/bleed easily.  Psychiatric/Behavioral: Negative for depression. The patient is not nervous/anxious and does not have insomnia.      MEDICAL HISTORY:  Past Medical History:  Diagnosis Date  . Anemia   . Anxiety   . Atrial fibrillation ([place])   . Cardiomyopathy ([place])   . CHF (congestive heart failure) ([place])   . Chronic kidney disease    kidney stones  . Coronary artery disease   . Cough   . Dysrhythmia   . Hypertension   . Lymphadenopathy, hilar   . MI (myocardial infarction) ([place])    x 2  . Wheezing     SURGICAL HISTORY: Past Surgical History:  Procedure Laterality Date  . BRONCHIAL NEEDLE ASPIRATION BIOPSY N/A 12/31/2014   Procedure: BRONCHIAL NEEDLE ASPIRATION BIOPSIES from carina;  Surgeon: Flora Lipps, MD;  Location: [facility] ORS;  Service: Cardiopulmonary;  Laterality: N/A;  . CARDIAC CATHETERIZATION N/A 12/28/2015   Procedure: Left Heart Cath and Cors/Grafts Angiography;  Surgeon: Yolonda Kida, MD;  Location: [facility];  Service: Cardiovascular;  Laterality: N/A;  . CATARACT EXTRACTION W/PHACO Left 08/01/2016   Procedure: CATARACT EXTRACTION PHACO AND INTRAOCULAR LENS PLACEMENT ([place]) Left;  Surgeon: Leandrew Koyanagi, MD;  Location: [facility];  Service: Ophthalmology;  Laterality: Left;  . CORONARY ANGIOPLASTY WITH STENT PLACEMENT    . CORONARY ARTERY BYPASS GRAFT    . ENDOBRONCHIAL ULTRASOUND N/A 12/31/2014   Procedure: ENDOBRONCHIAL ULTRASOUND;  Surgeon: Flora Lipps, MD;  Location: [facility];  Service: Cardiopulmonary;  Laterality: N/A;  . ESOPHAGOGASTRODUODENOSCOPY (EGD) WITH PROPOFOL N/A 06/20/2015   Procedure: ESOPHAGOGASTRODUODENOSCOPY (EGD) WITH PROPOFOL;  Surgeon: Lollie Sails, MD;  Location: [facility] ENDOSCOPY;  Service: Endoscopy;  Laterality: N/A;    SOCIAL HISTORY: lives in  [place]; alone. 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  Social History    Socioeconomic History  . Marital status: Widowed    Spouse name: Not on file  . Number of children: 2  . Years of education: college  . Highest education level: Some college, no degree  Occupational History  . Occupation: retired  Scientific laboratory technician  . Financial resource strain: Not hard at all  . Food insecurity    Worry: Never true    Inability: Never true  . Transportation needs    Medical: No    Non-medical: No  Tobacco Use  . Smoking status: Former Smoker    Quit date: 06/27/1977    Years since quitting: 41.4  . Smokeless tobacco: Current User    Types: Chew  Substance and Sexual Activity  . Alcohol use: Yes    Comment: occational almost rare once a year  . Drug use: No  . Sexual activity: Never    Birth control/protection: Abstinence  Lifestyle  . Physical activity    Days per week: 7 days    Minutes per session: 30 min  . Stress: Not at all  Relationships  . Social connections    Talks on phone: More than three times a week    Gets together: More than three times a week    Attends religious service: More than 4 times per year    Active member of club or organization: No    Attends meetings of clubs or organizations: Never    Relationship status: Widowed  . Intimate partner violence    Fear of current or ex partner: Patient refused    Emotionally abused: Patient refused    Physically abused: Patient refused    Forced sexual activity: Patient refused  Other Topics Concern  . Not on file  Social History Narrative  . Not on file    FAMILY HISTORY: Family History  Problem Relation Age of Onset  . CAD Mother   . Colon cancer Father     ALLERGIES:  is allergic to 5ht3 receptor antagonists; diphenhydramine hcl; escitalopram; maxidex [dexamethasone]; prednisone; serotonin; vytorin [ezetimibe-simvastatin]; zocor [simvastatin]; and diltiazem.  MEDICATIONS:  Current Outpatient Medications  Medication Sig Dispense Refill  . atorvastatin (LIPITOR) 40 MG tablet  Take 1 tablet (40 mg total) by mouth daily at 6 PM. 30 tablet 0  . digoxin (LANOXIN) 0.125 MG tablet Take 0.0625 mg by mouth daily.    Marland Kitchen docusate sodium (COLACE) 100 MG capsule Take 100 mg by mouth daily as needed.     . ferrous sulfate 325 (65 FE) MG tablet Take 1 tablet (325 mg total) by mouth daily. (Patient taking differently: Take 325 mg by mouth daily with breakfast. ) 30 tablet 3  . furosemide (LASIX) 20 MG tablet Take 40 mg by mouth as needed. For weight gain greater than 2 pounds in 24 hours or greater than 5 pounds in one week    . metoprolol tartrate (LOPRESSOR) 12.5 mg TABS tablet Take 12.5 mg by mouth 2 (two) times daily.    . nitroGLYCERIN (NITROSTAT) 0.4 MG SL tablet Place 0.4 mg under the tongue every 5 (five) minutes as needed for chest pain.    . pantoprazole (PROTONIX) 40 MG tablet Take 40 mg by mouth 2 (two) times daily before a meal.     . ranolazine (RANEXA) 500 MG 12 hr tablet Take 1 tablet (500 mg total) by mouth 2 (two) times daily. 60 tablet 0  . sucralfate (CARAFATE) 1 G tablet Take  1 g by mouth 4 (four) times daily.     . tamsulosin (FLOMAX) 0.4 MG CAPS capsule Take 0.4 mg by mouth daily after supper.     . vitamin C (VITAMIN C) 250 MG tablet Take 1 tablet (250 mg total) by mouth daily. 30 tablet 0  . acetaminophen (TYLENOL) 500 MG tablet Take 500 mg by mouth every 6 (six) hours as needed for mild pain or moderate pain.     No current facility-administered medications for this visit.      PHYSICAL EXAMINATION: ECOG PERFORMANCE STATUS: 0 - Asymptomatic  Vitals:   12/10/18 0959  BP: 133/72  Pulse: (!) 55  Resp: 20  Temp: (!) 97.2 F (36.2 C)   Filed Weights   12/10/18 0959  Weight: 198 lb (89.8 kg)    Physical Exam  Constitutional: He is oriented to person, place, and time.  Elderly Caucasian male patient. He is alone.  Walking by with a walker.  HENT:  Head: Normocephalic and atraumatic.  Mouth/Throat: Oropharynx is clear and moist. No oropharyngeal  exudate.  Eyes: Pupils are equal, round, and reactive to light.  Neck: Normal range of motion. Neck supple.  Cardiovascular: Normal rate and regular rhythm.  Pulmonary/Chest: Breath sounds normal. No respiratory distress. He has no wheezes.  Abdominal: Soft. Bowel sounds are normal. He exhibits no distension and no mass. There is no abdominal tenderness. There is no rebound and no guarding.  Musculoskeletal: Normal range of motion.        General: No tenderness or edema.  Neurological: He is alert and oriented to person, place, and time.  Skin: Skin is warm.  Psychiatric: Affect normal.   .   LABORATORY DATA:  I have reviewed the data as listed Lab Results  Component Value Date   WBC 6.1 12/10/2018   HGB 10.6 (L) 12/10/2018   HCT 34.8 (L) 12/10/2018   MCV 108.4 (H) 12/10/2018   PLT 140 (L) 12/10/2018   Recent Labs    12/19/17 0808  10/15/18 1003 11/12/18 1003 12/10/18 0940  NA 138   < > 139 137 136  K 4.5   < > 4.4 4.0 4.8  CL 106   < > 108 106 110  CO2 20*   < > 22 18* 21*  GLUCOSE 102*   < > 117* 122* 113*  BUN 28*   < > 27* 31* 27*  CREATININE 1.77*   < > 1.59* 1.79* 1.58*  CALCIUM 8.9   < > 9.2 8.9 9.2  GFRNONAA 34*   < > 39* 34* 39*  GFRAA 39*   < > 45* 39* 46*  PROT 8.1  --   --   --   --   ALBUMIN 4.5  --   --   --   --   AST 26  --   --   --   --   ALT 16  --   --   --   --   ALKPHOS 67  --   --   --   --   BILITOT 0.5  --   --   --   --    < > = values in this interval not displayed.   ASSESSMENT & PLAN:   Anemia of chronic kidney failure, stage 3 (moderate) (HCC) # Anemia mild hemoglobin likely since 2012.  Suspect MDS/chronic kidney disease.   #Hemoglobin 10.6; HOLD aranesp today; and continue every 2 to 3 weeks.  Continue IV iron as needed.  #  Mild intermittent thrombocytopenia-120s to 150s. Stable.   # CKD Stage III- creat 1.5 stable  # Left upper lobe cystic lesion noted-postinflammatory versus low-grade malignancy- repeat in dec 2020.   #I  spoke to patient's son, Marbin  over the phone regarding above plan.    #Disposition: # HOLD Aranesp today # in 2 weeks- H&H- aranesp # in 4 weeks MD- cbc/bmp-possible aranesp-Dr.B       Cammie Sickle, MD 12/10/2018 10:28 AM

## 2018-12-10 NOTE — Assessment & Plan Note (Signed)
#   Anemia mild hemoglobin likely since 2012.  Suspect MDS/chronic kidney disease.   #Hemoglobin 10.6; HOLD aranesp today; and continue every 2 to 3 weeks.  Continue IV iron as needed.  #Mild intermittent thrombocytopenia-120s to 150s. Stable.   # CKD Stage III- creat 1.5 stable  # Left upper lobe cystic lesion noted-postinflammatory versus low-grade malignancy- repeat in dec 2020.   #I spoke to patient's son, Kensley  over the phone regarding above plan.    #Disposition: # HOLD Aranesp today # in 2 weeks- H&H- aranesp # in 4 weeks MD- cbc/bmp-possible aranesp-Dr.B

## 2018-12-15 DIAGNOSIS — L97512 Non-pressure chronic ulcer of other part of right foot with fat layer exposed: Secondary | ICD-10-CM | POA: Diagnosis not present

## 2018-12-15 DIAGNOSIS — G609 Hereditary and idiopathic neuropathy, unspecified: Secondary | ICD-10-CM | POA: Diagnosis not present

## 2018-12-15 DIAGNOSIS — L97521 Non-pressure chronic ulcer of other part of left foot limited to breakdown of skin: Secondary | ICD-10-CM | POA: Diagnosis not present

## 2018-12-23 ENCOUNTER — Other Ambulatory Visit: Payer: Self-pay

## 2018-12-23 DIAGNOSIS — E782 Mixed hyperlipidemia: Secondary | ICD-10-CM | POA: Diagnosis not present

## 2018-12-23 DIAGNOSIS — Z0000 Encounter for general adult medical examination without abnormal findings: Secondary | ICD-10-CM | POA: Diagnosis not present

## 2018-12-23 DIAGNOSIS — N183 Chronic kidney disease, stage 3 (moderate): Secondary | ICD-10-CM | POA: Diagnosis not present

## 2018-12-23 DIAGNOSIS — Z125 Encounter for screening for malignant neoplasm of prostate: Secondary | ICD-10-CM | POA: Diagnosis not present

## 2018-12-23 DIAGNOSIS — I5022 Chronic systolic (congestive) heart failure: Secondary | ICD-10-CM | POA: Diagnosis not present

## 2018-12-24 ENCOUNTER — Inpatient Hospital Stay: Payer: PPO

## 2018-12-24 ENCOUNTER — Other Ambulatory Visit: Payer: Self-pay

## 2018-12-24 ENCOUNTER — Inpatient Hospital Stay: Payer: PPO | Attending: Internal Medicine

## 2018-12-24 VITALS — BP 137/70 | HR 69

## 2018-12-24 DIAGNOSIS — N183 Chronic kidney disease, stage 3 (moderate): Secondary | ICD-10-CM | POA: Insufficient documentation

## 2018-12-24 DIAGNOSIS — D631 Anemia in chronic kidney disease: Secondary | ICD-10-CM

## 2018-12-24 DIAGNOSIS — I129 Hypertensive chronic kidney disease with stage 1 through stage 4 chronic kidney disease, or unspecified chronic kidney disease: Secondary | ICD-10-CM | POA: Diagnosis not present

## 2018-12-24 DIAGNOSIS — Z79899 Other long term (current) drug therapy: Secondary | ICD-10-CM | POA: Diagnosis not present

## 2018-12-24 DIAGNOSIS — D500 Iron deficiency anemia secondary to blood loss (chronic): Secondary | ICD-10-CM

## 2018-12-24 LAB — HEMOGLOBIN: Hemoglobin: 9.9 g/dL — ABNORMAL LOW (ref 13.0–17.0)

## 2018-12-24 LAB — HEMATOCRIT: HCT: 30.1 % — ABNORMAL LOW (ref 39.0–52.0)

## 2018-12-24 MED ORDER — DARBEPOETIN ALFA 300 MCG/0.6ML IJ SOSY
300.0000 ug | PREFILLED_SYRINGE | Freq: Once | INTRAMUSCULAR | Status: AC
  Administered 2018-12-24: 11:00:00 300 ug via SUBCUTANEOUS
  Filled 2018-12-24: qty 0.6

## 2019-01-05 DIAGNOSIS — L97512 Non-pressure chronic ulcer of other part of right foot with fat layer exposed: Secondary | ICD-10-CM | POA: Diagnosis not present

## 2019-01-05 DIAGNOSIS — L97521 Non-pressure chronic ulcer of other part of left foot limited to breakdown of skin: Secondary | ICD-10-CM | POA: Diagnosis not present

## 2019-01-06 ENCOUNTER — Other Ambulatory Visit: Payer: Self-pay

## 2019-01-07 ENCOUNTER — Other Ambulatory Visit: Payer: Self-pay

## 2019-01-07 ENCOUNTER — Inpatient Hospital Stay: Payer: PPO

## 2019-01-07 ENCOUNTER — Inpatient Hospital Stay: Payer: PPO | Admitting: Internal Medicine

## 2019-01-07 ENCOUNTER — Telehealth: Payer: Self-pay | Admitting: *Deleted

## 2019-01-07 ENCOUNTER — Inpatient Hospital Stay
Admission: EM | Admit: 2019-01-07 | Discharge: 2019-01-09 | DRG: 312 | Disposition: A | Discharge status: Home / Self Care | Payer: PPO | Attending: Internal Medicine | Admitting: Internal Medicine

## 2019-01-07 ENCOUNTER — Observation Stay: Payer: PPO

## 2019-01-07 DIAGNOSIS — R55 Syncope and collapse: Secondary | ICD-10-CM | POA: Diagnosis not present

## 2019-01-07 DIAGNOSIS — D509 Iron deficiency anemia, unspecified: Secondary | ICD-10-CM | POA: Diagnosis not present

## 2019-01-07 DIAGNOSIS — R42 Dizziness and giddiness: Secondary | ICD-10-CM | POA: Diagnosis not present

## 2019-01-07 DIAGNOSIS — I252 Old myocardial infarction: Secondary | ICD-10-CM

## 2019-01-07 DIAGNOSIS — I5022 Chronic systolic (congestive) heart failure: Secondary | ICD-10-CM | POA: Diagnosis present

## 2019-01-07 DIAGNOSIS — R2689 Other abnormalities of gait and mobility: Secondary | ICD-10-CM | POA: Diagnosis present

## 2019-01-07 DIAGNOSIS — R001 Bradycardia, unspecified: Secondary | ICD-10-CM

## 2019-01-07 DIAGNOSIS — I5021 Acute systolic (congestive) heart failure: Secondary | ICD-10-CM | POA: Diagnosis not present

## 2019-01-07 DIAGNOSIS — E785 Hyperlipidemia, unspecified: Secondary | ICD-10-CM | POA: Diagnosis not present

## 2019-01-07 DIAGNOSIS — N183 Chronic kidney disease, stage 3 (moderate): Secondary | ICD-10-CM | POA: Diagnosis not present

## 2019-01-07 DIAGNOSIS — F419 Anxiety disorder, unspecified: Secondary | ICD-10-CM | POA: Diagnosis present

## 2019-01-07 DIAGNOSIS — I480 Paroxysmal atrial fibrillation: Secondary | ICD-10-CM | POA: Diagnosis present

## 2019-01-07 DIAGNOSIS — Z955 Presence of coronary angioplasty implant and graft: Secondary | ICD-10-CM

## 2019-01-07 DIAGNOSIS — I10 Essential (primary) hypertension: Secondary | ICD-10-CM | POA: Diagnosis not present

## 2019-01-07 DIAGNOSIS — F17220 Nicotine dependence, chewing tobacco, uncomplicated: Secondary | ICD-10-CM | POA: Diagnosis present

## 2019-01-07 DIAGNOSIS — I2510 Atherosclerotic heart disease of native coronary artery without angina pectoris: Secondary | ICD-10-CM | POA: Diagnosis not present

## 2019-01-07 DIAGNOSIS — I509 Heart failure, unspecified: Secondary | ICD-10-CM | POA: Diagnosis not present

## 2019-01-07 DIAGNOSIS — I951 Orthostatic hypotension: Secondary | ICD-10-CM | POA: Diagnosis not present

## 2019-01-07 DIAGNOSIS — Z8249 Family history of ischemic heart disease and other diseases of the circulatory system: Secondary | ICD-10-CM

## 2019-01-07 DIAGNOSIS — K219 Gastro-esophageal reflux disease without esophagitis: Secondary | ICD-10-CM | POA: Diagnosis present

## 2019-01-07 DIAGNOSIS — Z800 Family history of malignant neoplasm of digestive organs: Secondary | ICD-10-CM | POA: Diagnosis not present

## 2019-01-07 DIAGNOSIS — I130 Hypertensive heart and chronic kidney disease with heart failure and stage 1 through stage 4 chronic kidney disease, or unspecified chronic kidney disease: Secondary | ICD-10-CM | POA: Diagnosis present

## 2019-01-07 DIAGNOSIS — Z20828 Contact with and (suspected) exposure to other viral communicable diseases: Secondary | ICD-10-CM | POA: Diagnosis present

## 2019-01-07 DIAGNOSIS — Z03818 Encounter for observation for suspected exposure to other biological agents ruled out: Secondary | ICD-10-CM | POA: Diagnosis not present

## 2019-01-07 DIAGNOSIS — R0902 Hypoxemia: Secondary | ICD-10-CM | POA: Diagnosis not present

## 2019-01-07 LAB — MAGNESIUM: Magnesium: 1.5 mg/dL — ABNORMAL LOW (ref 1.7–2.4)

## 2019-01-07 LAB — URINALYSIS, COMPLETE (UACMP) WITH MICROSCOPIC
Bacteria, UA: NONE SEEN
Bilirubin Urine: NEGATIVE
Glucose, UA: NEGATIVE mg/dL
Hgb urine dipstick: NEGATIVE
Ketones, ur: NEGATIVE mg/dL
Leukocytes,Ua: NEGATIVE
Nitrite: NEGATIVE
Protein, ur: NEGATIVE mg/dL
Specific Gravity, Urine: 1.013 (ref 1.005–1.030)
pH: 5 (ref 5.0–8.0)

## 2019-01-07 LAB — BASIC METABOLIC PANEL
Anion gap: 10 (ref 5–15)
BUN: 24 mg/dL — ABNORMAL HIGH (ref 8–23)
CO2: 21 mmol/L — ABNORMAL LOW (ref 22–32)
Calcium: 8.8 mg/dL — ABNORMAL LOW (ref 8.9–10.3)
Chloride: 109 mmol/L (ref 98–111)
Creatinine, Ser: 1.51 mg/dL — ABNORMAL HIGH (ref 0.61–1.24)
GFR calc Af Amer: 48 mL/min — ABNORMAL LOW (ref >60)
GFR calc non Af Amer: 42 mL/min — ABNORMAL LOW (ref >60)
Glucose, Bld: 99 mg/dL (ref 70–99)
Potassium: 4.3 mmol/L (ref 3.5–5.1)
Sodium: 140 mmol/L (ref 135–145)

## 2019-01-07 LAB — TROPONIN I (HIGH SENSITIVITY): Troponin I (High Sensitivity): 14 ng/L (ref <18)

## 2019-01-07 LAB — HEPATIC FUNCTION PANEL
ALT: 15 U/L (ref 0–44)
AST: 21 U/L (ref 15–41)
Albumin: 3.8 g/dL (ref 3.5–5.0)
Alkaline Phosphatase: 59 U/L (ref 38–126)
Bilirubin, Direct: 0.2 mg/dL (ref 0.0–0.2)
Indirect Bilirubin: 0.7 mg/dL (ref 0.3–0.9)
Total Bilirubin: 0.9 mg/dL (ref 0.3–1.2)
Total Protein: 7.8 g/dL (ref 6.5–8.1)

## 2019-01-07 LAB — CBC
HCT: 32.9 % — ABNORMAL LOW (ref 39.0–52.0)
Hemoglobin: 10.5 g/dL — ABNORMAL LOW (ref 13.0–17.0)
MCH: 33.7 pg (ref 26.0–34.0)
MCHC: 31.9 g/dL (ref 30.0–36.0)
MCV: 105.4 fL — ABNORMAL HIGH (ref 80.0–100.0)
Platelets: 104 10*3/uL — ABNORMAL LOW (ref 150–400)
RBC: 3.12 MIL/uL — ABNORMAL LOW (ref 4.22–5.81)
RDW: 17 % — ABNORMAL HIGH (ref 11.5–15.5)
WBC: 5.1 10*3/uL (ref 4.0–10.5)
nRBC: 0 % (ref 0.0–0.2)

## 2019-01-07 LAB — DIGOXIN LEVEL: Digoxin Level: 0.8 ng/mL (ref 0.8–2.0)

## 2019-01-07 MED ORDER — TAMSULOSIN HCL 0.4 MG PO CAPS
0.4000 mg | ORAL_CAPSULE | Freq: Every day | ORAL | Status: DC
  Administered 2019-01-07 – 2019-01-08 (×2): 0.4 mg via ORAL
  Filled 2019-01-07 (×2): qty 1

## 2019-01-07 MED ORDER — ACETAMINOPHEN 650 MG RE SUPP: 650.0000 mg | Freq: Four times a day (QID) | RECTAL | Status: DC | PRN

## 2019-01-07 MED ORDER — SODIUM CHLORIDE 0.9 % IV BOLUS
1000.0000 mL | Freq: Once | INTRAVENOUS | Status: AC
  Administered 2019-01-07: 10:00:00 1000 mL via INTRAVENOUS

## 2019-01-07 MED ORDER — METOPROLOL TARTRATE 25 MG PO TABS
25.0000 mg | ORAL_TABLET | Freq: Two times a day (BID) | ORAL | Status: DC
  Administered 2019-01-07 – 2019-01-09 (×2): 25 mg via ORAL
  Filled 2019-01-07 (×4): qty 1

## 2019-01-07 MED ORDER — DIGOXIN 125 MCG PO TABS
0.0625 mg | ORAL_TABLET | Freq: Every day | ORAL | Status: DC
  Administered 2019-01-07 – 2019-01-09 (×3): 0.0625 mg via ORAL
  Filled 2019-01-07 (×3): qty 0.5

## 2019-01-07 MED ORDER — NITROGLYCERIN 0.4 MG SL SUBL: 0.4000 mg | SUBLINGUAL_TABLET | SUBLINGUAL | Status: DC | PRN

## 2019-01-07 MED ORDER — ACETAMINOPHEN 325 MG PO TABS: 650.0000 mg | ORAL_TABLET | Freq: Four times a day (QID) | ORAL | Status: DC | PRN

## 2019-01-07 MED ORDER — ATORVASTATIN CALCIUM 20 MG PO TABS
40.0000 mg | ORAL_TABLET | Freq: Every day | ORAL | Status: DC
  Administered 2019-01-07 – 2019-01-08 (×2): 40 mg via ORAL
  Filled 2019-01-07 (×2): qty 2

## 2019-01-07 MED ORDER — ENOXAPARIN SODIUM 40 MG/0.4ML ~~LOC~~ SOLN
40.0000 mg | SUBCUTANEOUS | Status: DC
  Administered 2019-01-07 – 2019-01-08 (×2): 40 mg via SUBCUTANEOUS
  Filled 2019-01-07 (×3): qty 0.4

## 2019-01-07 MED ORDER — ONDANSETRON HCL 4 MG/2ML IJ SOLN: 4.0000 mg | Freq: Four times a day (QID) | INTRAMUSCULAR | Status: DC | PRN

## 2019-01-07 MED ORDER — FAMOTIDINE 20 MG PO TABS
20.0000 mg | ORAL_TABLET | Freq: Two times a day (BID) | ORAL | Status: DC
  Administered 2019-01-07 – 2019-01-09 (×4): 20 mg via ORAL
  Filled 2019-01-07 (×4): qty 1

## 2019-01-07 MED ORDER — ONDANSETRON HCL 4 MG PO TABS: 4.0000 mg | ORAL_TABLET | Freq: Four times a day (QID) | ORAL | Status: DC | PRN

## 2019-01-07 MED ORDER — SODIUM CHLORIDE 0.9 % IV BOLUS
1000.0000 mL | Freq: Once | INTRAVENOUS | Status: AC
  Administered 2019-01-07: 12:00:00 1000 mL via INTRAVENOUS

## 2019-01-07 MED ORDER — FERROUS SULFATE 325 (65 FE) MG PO TABS
325.0000 mg | ORAL_TABLET | Freq: Every day | ORAL | Status: DC
  Administered 2019-01-08 – 2019-01-09 (×2): 325 mg via ORAL
  Filled 2019-01-07 (×2): qty 1

## 2019-01-07 MED ORDER — POLYETHYLENE GLYCOL 3350 17 G PO PACK: 17.0000 g | PACK | Freq: Every day | ORAL | Status: DC | PRN

## 2019-01-07 MED ORDER — RANOLAZINE ER 500 MG PO TB12
500.0000 mg | ORAL_TABLET | Freq: Two times a day (BID) | ORAL | Status: DC
  Administered 2019-01-07 – 2019-01-09 (×4): 500 mg via ORAL
  Filled 2019-01-07 (×5): qty 1

## 2019-01-07 MED ORDER — PANTOPRAZOLE SODIUM 40 MG PO TBEC
40.0000 mg | DELAYED_RELEASE_TABLET | Freq: Two times a day (BID) | ORAL | Status: DC
  Administered 2019-01-07 – 2019-01-09 (×4): 40 mg via ORAL
  Filled 2019-01-07 (×4): qty 1

## 2019-01-07 MED ORDER — SODIUM CHLORIDE 0.9 % IV SOLN
INTRAVENOUS | Status: AC
  Administered 2019-01-07 – 2019-01-08 (×2): via INTRAVENOUS

## 2019-01-07 MED ORDER — SUCRALFATE 1 G PO TABS
1.0000 g | ORAL_TABLET | Freq: Four times a day (QID) | ORAL | Status: DC
  Administered 2019-01-07 – 2019-01-09 (×8): 1 g via ORAL
  Filled 2019-01-07 (×8): qty 1

## 2019-01-07 NOTE — Progress Notes (Signed)
Family Meeting Note  Advance Directive: no  Today a meeting took place with the Patient and daughter.  Patient is able to participate.  The following clinical team members were present during this meeting:MD  The following were discussed:Patient's diagnosis: orthostatic hypotension, Patient's progosis: Unable to determine and Goals for treatment: Full Code  Additional follow-up to be provided: prn  Time spent during discussion:20 minutes  Evette Doffing, MD

## 2019-01-07 NOTE — ED Notes (Signed)
Pt transported to CT for scan and then will be taken to 1C-114

## 2019-01-07 NOTE — ED Notes (Signed)
Admitting MD at bedside.

## 2019-01-07 NOTE — ED Provider Notes (Addendum)
[facility] Emergency Department Provider Note   ____________________________________________   First MD Initiated Contact with Patient 01/07/19 [phone]     (approximate)  I have reviewed the triage vital signs and the nursing notes.   HISTORY  Chief Complaint Dizziness    HPI Andres Gutierrez is a 85 y.o. male who reports he felt dizzy this morning when he got up.  Dizziness is feeling like he is going to pass out when he stands up.  He has no vertigo with head movement no nystagmus.  Is not had this problem before.  He is seen in the cancer center and get shots for anemia.  He has no chest pain or headache or any other symptoms.  He says his arms and legs hurt when he stands up.  Symptoms of hurting in the arms and legs are bilaterally and equal.  He is okay when he lays back down.  Nurse reports he gets bradycardic into the 40s while he is laying there.         Past Medical History:  Diagnosis Date  . Anemia   . Anxiety   . Atrial fibrillation ([place])   . Cardiomyopathy ([place])   . CHF (congestive heart failure) ([place])   . Chronic kidney disease    kidney stones  . Coronary artery disease   . Cough   . Dysrhythmia   . Hypertension   . Lymphadenopathy, hilar   . MI (myocardial infarction) ([place])    x 2  . Wheezing     Patient Active Problem List   Diagnosis Date Noted  . Macrocytic anemia 08/05/2018  . Anemia of chronic kidney failure, stage 3 (moderate) ([place]) 08/05/2018  . Chronic systolic heart failure ([place]) 01/17/2016  . Chewing tobacco use 01/17/2016  . Atrial fibrillation with RVR ([place]) 12/23/2015  . Dyspnea on exertion 12/23/2015  . Elevated troponin 12/23/2015  . GERD (gastroesophageal reflux disease) 12/23/2015  . Iron deficiency anemia due to chronic blood loss 12/20/2015  . Femoral neck fracture, left, closed, initial encounter 11/03/2015  . Chronic atrial fibrillation   . Coronary artery disease involving native coronary artery of  native heart without angina pectoris   . Acalculous cholecystitis 10/29/2015  . Adenopathy   . CAD (coronary artery disease) 12/22/2014  . HTN (hypertension) 12/22/2014    Past Surgical History:  Procedure Laterality Date  . BRONCHIAL NEEDLE ASPIRATION BIOPSY N/A 12/31/2014   Procedure: BRONCHIAL NEEDLE ASPIRATION BIOPSIES from carina;  Surgeon: Flora Lipps, MD;  Location: [facility] ORS;  Service: Cardiopulmonary;  Laterality: N/A;  . CARDIAC CATHETERIZATION N/A 12/28/2015   Procedure: Left Heart Cath and Cors/Grafts Angiography;  Surgeon: Yolonda Kida, MD;  Location: [facility];  Service: Cardiovascular;  Laterality: N/A;  . CATARACT EXTRACTION W/PHACO Left 08/01/2016   Procedure: CATARACT EXTRACTION PHACO AND INTRAOCULAR LENS PLACEMENT ([place]) Left;  Surgeon: Leandrew Koyanagi, MD;  Location: [facility];  Service: Ophthalmology;  Laterality: Left;  . CORONARY ANGIOPLASTY WITH STENT PLACEMENT    . CORONARY ARTERY BYPASS GRAFT    . ENDOBRONCHIAL ULTRASOUND N/A 12/31/2014   Procedure: ENDOBRONCHIAL ULTRASOUND;  Surgeon: Flora Lipps, MD;  Location: [facility] ORS;  Service: Cardiopulmonary;  Laterality: N/A;  . ESOPHAGOGASTRODUODENOSCOPY (EGD) WITH PROPOFOL N/A 06/20/2015   Procedure: ESOPHAGOGASTRODUODENOSCOPY (EGD) WITH PROPOFOL;  Surgeon: Lollie Sails, MD;  Location[facility] ENDOSCOPY;  Service: Endoscopy;  Laterality: N/A;    Prior to Admission medications   Medication Sig Start Date End Date Taking? Authorizing Provider  acetaminophen (TYLENOL) 500 MG tablet Take 500 mg by mouth every 6 (six) hours as needed for mild pain or moderate pain.    [provider]  atorvastatin (LIPITOR) 40 MG tablet Take 1 tablet (40 mg total) by mouth daily at 6 PM. 12/30/15   Dustin Flock, MD  digoxin (LANOXIN) 0.125 MG tablet Take 0.0625 mg by mouth daily.    [provider]  docusate sodium (COLACE) 100 MG capsule Take 100 mg by mouth daily as needed.     [provider]  ferrous sulfate 325 (65 FE) MG tablet Take 1 tablet (325 mg total) by mouth daily. Patient taking differently: Take 325 mg by mouth daily with breakfast.  11/01/15   Dustin Flock, MD  furosemide (LASIX) 20 MG tablet Take 40 mg by mouth as needed. For weight gain greater than 2 pounds in 24 hours or greater than 5 pounds in one week    [provider]  metoprolol tartrate (LOPRESSOR) 12.5 mg TABS tablet Take 12.5 mg by mouth 2 (two) times daily.    [provider]  nitroGLYCERIN (NITROSTAT) 0.4 MG SL tablet Place 0.4 mg under the tongue every 5 (five) minutes as needed for chest pain.    [provider]  pantoprazole (PROTONIX) 40 MG tablet Take 40 mg by mouth 2 (two) times daily before a meal.     [provider]  ranolazine (RANEXA) 500 MG 12 hr tablet Take 1 tablet (500 mg total) by mouth 2 (two) times daily. 12/30/15   Dustin Flock, MD  sucralfate (CARAFATE) 1 G tablet Take 1 g by mouth 4 (four) times daily.     [provider]  tamsulosin (FLOMAX) 0.4 MG CAPS capsule Take 0.4 mg by mouth daily after supper.     [provider]  vitamin C (VITAMIN C) 250 MG tablet Take 1 tablet (250 mg total) by mouth daily. 11/01/15   Dustin Flock, MD    Allergies 5ht3 receptor antagonists, Diphenhydramine hcl, Escitalopram, Maxidex [dexamethasone], Prednisone, Serotonin, Vytorin [ezetimibe-simvastatin], Zocor [simvastatin], and Diltiazem  Family History  Problem Relation Age of Onset  . CAD Mother   . Colon cancer Father     Social History Social History   Tobacco Use  . Smoking status: Former Smoker    Quit date: 06/27/1977    Years since quitting: 41.5  . Smokeless tobacco: Current User    Types: Chew  Substance Use Topics  . Alcohol use: Yes    Comment: occational almost rare once a year  . Drug use: No    Review of Systems  Constitutional: No fever/chills Eyes: No visual changes. ENT: No sore throat.  Cardiovascular: Denies chest pain. Respiratory: Denies shortness of breath. Gastrointestinal: No abdominal pain.  No nausea, no vomiting.  No diarrhea.  No constipation. Genitourinary: Negative for dysuria. Musculoskeletal: Negative for back pain. Skin: Negative for rash. Neurological: Negative for headaches, focal weakness   ____________________________________________   PHYSICAL EXAM:  VITAL SIGNS: ED Triage Vitals [01/07/19 0708]  Enc Vitals Group     BP (!) 162/68     Pulse Rate (!) 59     Resp 12     Temp      Temp src      SpO2 100 %     Weight 190 lb (86.2 kg)     Height 6\' 3"  (1.905 m)     Head Circumference      Peak Flow      Pain Score 0  Pain Loc      Pain Edu?      Excl. in [place]?     Constitutional: Alert and oriented. Well appearing and in no acute distress. Eyes: Conjunctivae are normal.  Pupils are small bilaterally. Head: Atraumatic. Nose: No congestion/rhinnorhea. Mouth/Throat: Mucous membranes are moist.  Oropharynx non-erythematous. Neck: No stridor.  Cardiovascular: Normal rate, regular rhythm. Grossly normal heart sounds.  Good peripheral circulation. Respiratory: Normal respiratory effort.  No retractions. Lungs CTAB. Gastrointestinal: Soft and nontender. No distention. No abdominal bruits. No CVA tenderness. Musculoskeletal: No lower extremity tenderness nor edema.   Rectal: No masses stool is Hemoccult negative Neurologic:  Normal speech and language. No gross focal neurologic deficits are appreciated.  Cranial nerves II through XII are intact although visual fields were not checked cerebellar finger-to-nose heel-to-shin and rapid alternating movements and hands are normal motor strength is 5/5 in arms.  Patient is unsteady when he stands up and feels like he is got a pass out. Skin:  Skin is warm, dry and intact. No rash noted. Psychiatric: Mood and affect are normal. Speech and behavior are normal.   ____________________________________________   LABS (all labs ordered are listed, but only abnormal results are displayed)  Labs Reviewed  BASIC METABOLIC PANEL - Abnormal; Notable for the following components:      Result Value   CO2 21 (*)    BUN 24 (*)    Creatinine, Ser 1.51 (*)    Calcium 8.8 (*)    GFR calc non Af Amer 42 (*)    GFR calc Af Amer 48 (*)    All other components within normal limits  CBC - Abnormal; Notable for the following components:   RBC 3.12 (*)    Hemoglobin 10.5 (*)    HCT 32.9 (*)    MCV 105.4 (*)    RDW 17.0 (*)    Platelets 104 (*)    All other components within normal limits  URINALYSIS, COMPLETE (UACMP) WITH MICROSCOPIC - Abnormal; Notable for the following components:   Color, Urine YELLOW (*)    APPearance HAZY (*)    All other components within normal limits  HEPATIC FUNCTION PANEL  DIGOXIN LEVEL  TROPONIN I (HIGH SENSITIVITY)   ____________________________________________  EKG  EKG read interpreted by me shows sinus bradycardia rate of 56 normal axis left bundle branch block no acute ST-T wave changes no significant changes from October 2018. ____________________________________________  [place]  ED MD interpretation:    Official radiology report(s): No results found.  ____________________________________________   PROCEDURES  Procedure(s) performed (including Critical Care):  Procedures   ____________________________________________   INITIAL IMPRESSION / ASSESSMENT AND PLAN / ED COURSE  Patient lives by himself he is very unsteady when he stands and feels like he is going to pass out.  He is not safe to go home currently.  He does get bradycardic at times.  We will get him in the hospital make sure he is not bleeding anywhere slowly that sugars hemoglobin hematocrit is not dropping and rule him out.     ----------------------------------------- 1:37 PM on 01/07/2019 -----------------------------------------   After 2 L of fluid patient is no longer orthostatic but he still very wobbly and lightheaded when he stands up.       ____________________________________________   FINAL CLINICAL IMPRESSION(S) / ED DIAGNOSES  Final diagnoses:  Near syncope  Bradycardia     ED Discharge Orders    None       Note:  This document was prepared  using Systems analyst and may include unintentional dictation errors.    Nena Polio, MD 01/07/19 [id number]    Nena Polio, MD 01/07/19 [phone]

## 2019-01-07 NOTE — Progress Notes (Deleted)
[facility] NOTE  Patient Care Team: Rusty Aus, MD as PCP - General (Internal Medicine) Alisa Graff, FNP as Nurse Practitioner (Family Medicine) Ubaldo Glassing Javier Docker, MD as Consulting Physician (Cardiology) Erby Pian, MD as Referring Physician (Specialist)  CHIEF COMPLAINTS/PURPOSE OF CONSULTATION:   # 2012- Anemia 11-12- sec to CKD/IDA? MDS [no BMBx] [March 2017-sat-11%; ferritin-42] [Feb 2017- EGD/colo-July 2015 Dr.Skulskie];April 2017-PO Iron PO; May 2020-IV iron/Aranesp.  # Intermittent thrombocytopenia 120s-140s [Dec 2016-CT- neg cirrhosis/splenomegaly]  #June 2020 -LUL cystic lesion- 4x4cm ?  Question etiology [slightly increased from 2017]; question postinflammatory versus low-grade adenocarcinoma-repeat scan in 6 months [December 2020]  # CKD [creatinine 1.2- 1.4]; CAD/CHF  HISTORY OF PRESENTING ILLNESS:  Andres Gutierrez 85 y.o.  male history of above history of chronic anemia and intermittent thrombocytopenia is here for follow-up/ currently on IV iron and also Aranesp.  Patient denies any blood in stools or black or stools.  Mild to moderate fatigue.  Not any worse.  No nausea no vomiting.  His weight is stable.  No night sweats.  Review of Systems  Constitutional: Positive for malaise/fatigue. Negative for chills, diaphoresis, fever and weight loss.  HENT: Negative for nosebleeds and sore throat.   Eyes: Negative for double vision.  Respiratory: Negative for cough, hemoptysis, sputum production, shortness of breath and wheezing.   Cardiovascular: Negative for chest pain, palpitations, orthopnea and leg swelling.  Gastrointestinal: Negative for abdominal pain, blood in stool, constipation, diarrhea, heartburn, melena, nausea and vomiting.  Genitourinary: Negative for dysuria, frequency and urgency.  Musculoskeletal: Positive for back pain and joint pain.  Skin: Negative.  Negative for itching and rash.  Neurological: Negative for  dizziness, tingling, focal weakness, weakness and headaches.  Endo/Heme/Allergies: Does not bruise/bleed easily.  Psychiatric/Behavioral: Negative for depression. The patient is not nervous/anxious and does not have insomnia.      MEDICAL HISTORY:  Past Medical History:  Diagnosis Date  . Anemia   . Anxiety   . Atrial fibrillation ([place])   . Cardiomyopathy ([place])   . CHF (congestive heart failure) ([place])   . Chronic kidney disease    kidney stones  . Coronary artery disease   . Cough   . Dysrhythmia   . Hypertension   . Lymphadenopathy, hilar   . MI (myocardial infarction) ([place])    x 2  . Wheezing     SURGICAL HISTORY: Past Surgical History:  Procedure Laterality Date  . BRONCHIAL NEEDLE ASPIRATION BIOPSY N/A 12/31/2014   Procedure: BRONCHIAL NEEDLE ASPIRATION BIOPSIES from carina;  Surgeon: Flora Lipps, MD;  Location: [facility] ORS;  Service: Cardiopulmonary;  Laterality: N/A;  . CARDIAC CATHETERIZATION N/A 12/28/2015   Procedure: Left Heart Cath and Cors/Grafts Angiography;  Surgeon: Yolonda Kida, MD;  Location: [facility];  Service: Cardiovascular;  Laterality: N/A;  . CATARACT EXTRACTION W/PHACO Left 08/01/2016   Procedure: CATARACT EXTRACTION PHACO AND INTRAOCULAR LENS PLACEMENT ([place]) Left;  Surgeon: Leandrew Koyanagi, MD;  Location: [facility];  Service: Ophthalmology;  Laterality: Left;  . CORONARY ANGIOPLASTY WITH STENT PLACEMENT    . CORONARY ARTERY BYPASS GRAFT    . ENDOBRONCHIAL ULTRASOUND N/A 12/31/2014   Procedure: ENDOBRONCHIAL ULTRASOUND;  Surgeon: Flora Lipps, MD;  Location: [facility];  Service: Cardiopulmonary;  Laterality: N/A;  . ESOPHAGOGASTRODUODENOSCOPY (EGD) WITH PROPOFOL N/A 06/20/2015   Procedure: ESOPHAGOGASTRODUODENOSCOPY (EGD) WITH PROPOFOL;  Surgeon: Lollie Sails, MD;  Location: [facility] END[facility];  Service: Endoscopy;  Laterality: N/A;    SOCIAL HISTORY: lives in  [place]; alone. 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  Social History    Socioeconomic History  . Marital status: Widowed    Spouse name: Not on file  . Number of children: 2  . Years of education: college  . Highest education level: Some college, no degree  Occupational History  . Occupation: retired  Scientific laboratory technician  . Financial resource strain: Not hard at all  . Food insecurity    Worry: Never true    Inability: Never true  . Transportation needs    Medical: No    Non-medical: No  Tobacco Use  . Smoking status: Former Smoker    Quit date: 06/27/1977    Years since quitting: 41.5  . Smokeless tobacco: Current User    Types: Chew  Substance and Sexual Activity  . Alcohol use: Yes    Comment: occational almost rare once a year  . Drug use: No  . Sexual activity: Never    Birth control/protection: Abstinence  Lifestyle  . Physical activity    Days per week: 7 days    Minutes per session: 30 min  . Stress: Not at all  Relationships  . Social connections    Talks on phone: More than three times a week    Gets together: More than three times a week    Attends religious service: More than 4 times per year    Active member of club or organization: No    Attends meetings of clubs or organizations: Never    Relationship status: Widowed  . Intimate partner violence    Fear of current or ex partner: Patient refused    Emotionally abused: Patient refused    Physically abused: Patient refused    Forced sexual activity: Patient refused  Other Topics Concern  . Not on file  Social History Narrative  . Not on file    FAMILY HISTORY: Family History  Problem Relation Age of Onset  . CAD Mother   . Colon cancer Father     ALLERGIES:  is allergic to 5ht3 receptor antagonists; diphenhydramine hcl; escitalopram; maxidex [dexamethasone]; prednisone; serotonin; vytorin [ezetimibe-simvastatin]; zocor [simvastatin]; and diltiazem.  MEDICATIONS:  Current Outpatient Medications  Medication Sig Dispense Refill  . acetaminophen (TYLENOL) 500 MG tablet  Take 500 mg by mouth every 6 (six) hours as needed for mild pain or moderate pain.    Marland Kitchen atorvastatin (LIPITOR) 40 MG tablet Take 1 tablet (40 mg total) by mouth daily at 6 PM. 30 tablet 0  . digoxin (LANOXIN) 0.125 MG tablet Take 0.0625 mg by mouth daily.    Marland Kitchen docusate sodium (COLACE) 100 MG capsule Take 100 mg by mouth daily as needed.     . ferrous sulfate 325 (65 FE) MG tablet Take 1 tablet (325 mg total) by mouth daily. (Patient taking differently: Take 325 mg by mouth daily with breakfast. ) 30 tablet 3  . furosemide (LASIX) 20 MG tablet Take 40 mg by mouth as needed. For weight gain greater than 2 pounds in 24 hours or greater than 5 pounds in one week    . metoprolol tartrate (LOPRESSOR) 12.5 mg TABS tablet Take 12.5 mg by mouth 2 (two) times daily.    . nitroGLYCERIN (NITROSTAT) 0.4 MG SL tablet Place 0.4 mg under the tongue every 5 (five) minutes as needed for chest pain.    . pantoprazole (PROTONIX) 40 MG tablet Take 40 mg by mouth 2 (two) times daily before a meal.     . ranolazine (RANEXA) 500 MG  12 hr tablet Take 1 tablet (500 mg total) by mouth 2 (two) times daily. 60 tablet 0  . sucralfate (CARAFATE) 1 G tablet Take 1 g by mouth 4 (four) times daily.     . tamsulosin (FLOMAX) 0.4 MG CAPS capsule Take 0.4 mg by mouth daily after supper.     . vitamin C (VITAMIN C) 250 MG tablet Take 1 tablet (250 mg total) by mouth daily. 30 tablet 0   No current facility-administered medications for this visit.      PHYSICAL EXAMINATION: ECOG PERFORMANCE STATUS: 0 - Asymptomatic  There were no vitals filed for this visit. There were no vitals filed for this visit.  Physical Exam  Constitutional: He is oriented to person, place, and time.  Elderly Caucasian male patient. He is alone.  Walking by with a walker.  HENT:  Head: Normocephalic and atraumatic.  Mouth/Throat: Oropharynx is clear and moist. No oropharyngeal exudate.  Eyes: Pupils are equal, round, and reactive to light.  Neck:  Normal range of motion. Neck supple.  Cardiovascular: Normal rate and regular rhythm.  Pulmonary/Chest: Breath sounds normal. No respiratory distress. He has no wheezes.  Abdominal: Soft. Bowel sounds are normal. He exhibits no distension and no mass. There is no abdominal tenderness. There is no rebound and no guarding.  Musculoskeletal: Normal range of motion.        General: No tenderness or edema.  Neurological: He is alert and oriented to person, place, and time.  Skin: Skin is warm.  Psychiatric: Affect normal.   .   LABORATORY DATA:  I have reviewed the data as listed Lab Results  Component Value Date   WBC 5.1 01/07/2019   HGB 10.5 (L) 01/07/2019   HCT 32.9 (L) 01/07/2019   MCV 105.4 (H) 01/07/2019   PLT 104 (L) 01/07/2019   Recent Labs    11/12/18 1003 12/10/18 0940 01/07/19 0711  NA 137 136 140  K 4.0 4.8 4.3  CL 106 110 109  CO2 18* 21* 21*  GLUCOSE 122* 113* 99  BUN 31* 27* 24*  CREATININE 1.79* 1.58* 1.51*  CALCIUM 8.9 9.2 8.8*  GFRNONAA 34* 39* 42*  GFRAA 39* 46* 48*   ASSESSMENT & PLAN:   Anemia of chronic kidney failure, stage 3 (moderate) (HCC) # Anemia mild hemoglobin likely since 2012.  Suspect MDS/chronic kidney disease.   #Hemoglobin 10.6; HOLD aranesp today; and continue every 2 to 3 weeks.  Continue IV iron as needed.  #Mild intermittent thrombocytopenia-120s to 150s. Stable.   # CKD Stage III- creat 1.5 stable  # Left upper lobe cystic lesion noted-postinflammatory versus low-grade malignancy- repeat in dec 2020.   #I spoke to patient's son, Andres Gutierrez  over the phone regarding above plan.    #Disposition: # HOLD Aranesp today # in 2 weeks- H&H- aranesp # in 4 weeks MD- cbc/bmp-possible aranesp-Dr.B       Cammie Sickle, MD 01/07/2019 8:34 AM

## 2019-01-07 NOTE — ED Notes (Signed)
ED TO INPATIENT HANDOFF REPORT  ED Nurse Name and Phone #: Janett Billow [phone]  S Name/Age/Gender Andres Gutierrez 85 y.o. male Room/Bed: ED07A/ED07A  Code Status   Code Status: Prior  Home/SNF/Other Home Patient oriented to: self, place, time and situation Is this baseline? Yes   Triage Complete: Triage complete  Chief Complaint EMS-Dizziness  Triage Note [facility]. Patient complaining of being dizzy that started this morning upon waking. Per patient no history of this. Patient states feeling weak. EMS states v/s WDL. Hx triple bypass 1984 and MI x 2. Heart stents 1997.    Allergies Allergies  Allergen Reactions  . 5ht3 Receptor Antagonists   . Diphenhydramine Hcl Other (See Comments)    Reaction:  Rash and fever a long time ago, but has had it since with no problem.  . Escitalopram Other (See Comments)    Reaction:  Makes him feel faint, like he was going to have a heart attack.  . Maxidex [Dexamethasone] Other (See Comments)    Reaction:  Unknown   . Prednisone Other (See Comments)    Pt states that this medication made him feel crazy.    . Serotonin Other (See Comments)    Tried 2 different types, lexapro and another one.  Reaction:  Made him feel like he was having a heart attack.   . Vytorin [Ezetimibe-Simvastatin] Other (See Comments)    Reaction:  Unknown   . Zocor [Simvastatin] Other (See Comments)    Reaction:  Unknown   . Diltiazem Rash    Level of Care/Admitting Diagnosis ED Disposition    ED Disposition Condition [facility] Area: [place] [[id number]]  Level of Care: Med-Surg [16]  Covid Evaluation: Asymptomatic Screening Protocol (No Symptoms)  Diagnosis: Orthostatic hypotension [458.0.ICD-9-CM]  Admitting Physician: Hyman Bible DODD [[id number]]  Attending Physician: Hyman Bible DODD [[id number]]  PT Class (Do Not Modify): Observation [104]  PT Acc Code (Do Not Modify): Observation [10022]        B Medical/Surgery History Past Medical History:  Diagnosis Date  . Anemia   . Anxiety   . Atrial fibrillation ([place])   . Cardiomyopathy ([place])   . CHF (congestive heart failure) ([place])   . Chronic kidney disease    kidney stones  . Coronary artery disease   . Cough   . Dysrhythmia   . Hypertension   . Lymphadenopathy, hilar   . MI (myocardial infarction) ([place])    x 2  . Wheezing    Past Surgical History:  Procedure Laterality Date  . BRONCHIAL NEEDLE ASPIRATION BIOPSY N/A 12/31/2014   Procedure: BRONCHIAL NEEDLE ASPIRATION BIOPSIES from carina;  Surgeon: Flora Lipps, MD;  Location: [facility] ORS;  Service: Cardiopulmonary;  Laterality: N/A;  . CARDIAC CATHETERIZATION N/A 12/28/2015   Procedure: Left Heart Cath and Cors/Grafts Angiography;  Surgeon: Yolonda Kida, MD;  Location: [place] [facility];  Service: Cardiovascular;  Laterality: N/A;  . CATARACT EXTRACTION W/PHACO Left 08/01/2016   Procedure: CATARACT EXTRACTION PHACO AND INTRAOCULAR LENS PLACEMENT ([place]) Left;  Surgeon: Leandrew Koyanagi, MD;  Location: [facility];  Service: Ophthalmology;  Laterality: Left;  . CORONARY ANGIOPLASTY WITH STENT PLACEMENT    . CORONARY ARTERY BYPASS GRAFT    . ENDOBRONCHIAL ULTRASOUND N/A 12/31/2014   Procedure: ENDOBRONCHIAL ULTRASOUND;  Surgeon: Flora Lipps, MD;  Location: [facility] ORS;  Service: Cardiopulmonary;  Laterality: N/A;  . ESOPHAGOGASTRODUODENOSCOPY (EGD) WITH PROPOFOL N/A 06/20/2015   Procedure: ESOPHAGOGASTRODUODENOSCOPY (EGD) WITH  PROPOFOL;  Surgeon: Lollie Sails, MD;  Location: [facility] ENDOSCOPY;  Service: Endoscopy;  Laterality: N/A;     A IV Location/Drains/Wounds Patient Lines/Drains/Airways Status   Active Line/Drains/Airways    Name:   Placement date:   Placement time:   Site:   Days:   Peripheral IV 08/12/18 Right Hand   08/12/18    1149    Hand   148   Peripheral IV 08/19/18 Right Hand   08/19/18    1111    Hand   141   Peripheral IV 01/07/19 Right  Antecubital   01/07/19    0738    Antecubital   less than 1          Intake/Output Last 24 hours No intake or output data in the 24 hours ending 01/07/19 1445  Labs/Imaging Results for orders placed or performed during the hospital encounter of 01/07/19 (from the past 48 hour(s))  Basic metabolic panel     Status: Abnormal   Collection Time: 01/07/19  7:11 AM  Result Value Ref Range   Sodium 140 135 - 145 mmol/L   Potassium 4.3 3.5 - 5.1 mmol/L   Chloride 109 98 - 111 mmol/L   CO2 21 (L) 22 - 32 mmol/L   Glucose, Bld 99 70 - 99 mg/dL   BUN 24 (H) 8 - 23 mg/dL   Creatinine, Ser 1.51 (H) 0.61 - 1.24 mg/dL   Calcium 8.8 (L) 8.9 - 10.3 mg/dL   GFR calc non Af Amer 42 (L) >60 mL/min   GFR calc Af Amer 48 (L) >60 mL/min   Anion gap 10 5 - 15    Comment: Performed at [facility], [address] [place], [place] [address]  CBC     Status: Abnormal   Collection Time: 01/07/19  7:11 AM  Result Value Ref Range   WBC 5.1 4.0 - 10.5 K/uL   RBC 3.12 (L) 4.22 - 5.81 MIL/uL   Hemoglobin 10.5 (L) 13.0 - 17.0 g/dL   HCT 32.9 (L) 39.0 - 52.0 %   MCV 105.4 (H) 80.0 - 100.0 fL   MCH 33.7 26.0 - 34.0 pg   MCHC 31.9 30.0 - 36.0 g/dL   RDW 17.0 (H) 11.5 - 15.5 %   Platelets 104 (L) 150 - 400 K/uL    Comment: PLATELET COUNT CONFIRMED BY SMEAR Immature Platelet Fraction may be clinically indicated, consider ordering this additional test [id number]    nRBC 0.0 0.0 - 0.2 %    Comment: Performed at [facility], [address] [place], [place] [address]  Hepatic function panel     Status: None   Collection Time: 01/07/19  7:11 AM  Result Value Ref Range   Total Protein 7.8 6.5 - 8.1 g/dL   Albumin 3.8 3.5 - 5.0 g/dL   AST 21 15 - 41 U/L   ALT 15 0 - 44 U/L   Alkaline Phosphatase 59 38 - 126 U/L   Total Bilirubin 0.9 0.3 - 1.2 mg/dL   Bilirubin, Direct 0.2 0.0 - 0.2 mg/dL   Indirect Bilirubin 0.7 0.3 - 0.9 mg/dL    Comment: Performed at [facility], [place], [place] [address]  Troponin I (High Sensitivity)     Status: None   Collection Time: 01/07/19  7:11 AM  Result Value Ref Range   Troponin I (High Sensitivity) 14 <18 ng/L    Comment: (NOTE) Elevated high sensitivity troponin I (hsTnI) values and significant  changes across serial measurements may suggest ACS but many other  chronic and acute conditions are known to elevate hsTnI results.  Refer to the "Links" section for chest pain algorithms and additional  guidance. Performed at [facility], [address] [place], [place] [address]   Urinalysis, Complete w Microscopic     Status: Abnormal   Collection Time: 01/07/19  8:33 AM  Result Value Ref Range   Color, Urine YELLOW (A) YELLOW   APPearance HAZY (A) CLEAR   Specific Gravity, Urine 1.013 1.005 - 1.030   pH 5.0 5.0 - 8.0   Glucose, UA NEGATIVE NEGATIVE mg/dL   Hgb urine dipstick NEGATIVE NEGATIVE   Bilirubin Urine NEGATIVE NEGATIVE   Ketones, ur NEGATIVE NEGATIVE mg/dL   Protein, ur NEGATIVE NEGATIVE mg/dL   Nitrite NEGATIVE NEGATIVE   Leukocytes,Ua NEGATIVE NEGATIVE   RBC / HPF 0-5 0 - 5 RBC/hpf   WBC, UA 0-5 0 - 5 WBC/hpf   Bacteria, UA NONE SEEN NONE SEEN   Squamous Epithelial / LPF 0-5 0 - 5   Mucus PRESENT     Comment: Performed at [facility], [address] [place], [place] [address]  Digoxin level     Status: None   Collection Time: 01/07/19  9:36 AM  Result Value Ref Range   Digoxin Level 0.8 0.8 - 2.0 ng/mL    Comment: Performed at [facility], [address] [place],  [address]   No results found.  Pending Labs [employer] (From admission, onward)    Start     Ordered   01/07/19 0918  Novel Coronavirus, NAA (Hosp order, Send-out to Ref Lab; TAT 18-24 hrs  (Symptomatic/High Risk of Exposure/Tier 1 Patients Labs with Precautions)  Once,   STAT    Question Answer Comment  Is this test for diagnosis or screening Diagnosis of ill patient    Symptomatic for COVID-19 as defined by CDC Yes   Date of Symptom Onset 01/07/2019   Hospitalized for COVID-19 Yes   Admitted to ICU for COVID-19 No   Previously tested for COVID-19 No   Resident in a congregate (group) care setting No   Employed in healthcare setting No      01/07/19 0917   Signed and Held  Basic metabolic panel  Tomorrow morning,   R     Signed and Held   Signed and Held  CBC  Tomorrow morning,   R     Signed and Held          Vitals/Pain Today's Vitals   01/07/19 1300 01/07/19 1330 01/07/19 1400 01/07/19 1430  BP: 134/87 (!) 179/57 (!) 165/64 (!) 150/62  Pulse:  63 (!) 59 (!) 56  Resp:  17 18 18   Temp:      TempSrc:      SpO2:  100% 97% 98%  Weight:      Height:      PainSc:        Isolation Precautions Airborne and Contact precautions  Medications Medications  sodium chloride 0.9 % bolus 1,000 mL (0 mLs Intravenous Stopped 01/07/19 1142)  sodium chloride 0.9 % bolus 1,000 mL (1,000 mLs Intravenous Bolus 01/07/19 1207)    Mobility walks with person assist Low fall risk   Focused Assessments Dizziness with no known cause; pt was rehydrated in the ED and orthostatics taken multiple times; pt was orthostatic earlier then wasn't after fluids; now just c/o dizziness and does not feel safe going home  R Recommendations: See Admitting Provider Note  Report given to:   Additional Notes:

## 2019-01-07 NOTE — Assessment & Plan Note (Deleted)
#   Anemia mild hemoglobin likely since 2012.  Suspect MDS/chronic kidney disease.   #Hemoglobin 10.6; HOLD aranesp today; and continue every 2 to 3 weeks.  Continue IV iron as needed.  #Mild intermittent thrombocytopenia-120s to 150s. Stable.   # CKD Stage III- creat 1.5 stable  # Left upper lobe cystic lesion noted-postinflammatory versus low-grade malignancy- repeat in dec 2020.   #I spoke to patient's son, Peace  over the phone regarding above plan.    #Disposition: # HOLD Aranesp today # in 2 weeks- H&H- aranesp # in 4 weeks MD- cbc/bmp-possible aranesp-Dr.B  

## 2019-01-07 NOTE — ED Notes (Signed)
Pt became very dizzy upon standing.

## 2019-01-07 NOTE — H&P (Signed)
[place] [facility] [place] NAME: Andres Gutierrez    MR#:  [id number]  DATE OF BIRTH:  Nov 30, 1930  DATE OF ADMISSION:  01/07/2019  PRIMARY CARE PHYSICIAN: Rusty Aus, MD   REQUESTING/REFERRING PHYSICIAN: Conni Slipper, MD  CHIEF COMPLAINT:   Chief Complaint  Patient presents with  . Dizziness    HISTORY OF PRESENT ILLNESS:  Andres Gutierrez  is a 85 y.o. male with a known history of paroxysmal atrial fibrillation, CHF, CKD, CAD with history of MI x2, anxiety who presented to the ED with lightheadedness.  Patient states he became lightheaded and felt like he was going to pass out every time that he stood up.  His symptoms started this morning.  He denies any chest pain or shortness of breath.  He states that he is "shaky and nervous".  He denies any weakness of his arms or legs.  He does have intermittent right leg numbness that comes and goes over the last couple of months.  In the ED, was mildly hypertensive.  Orthostatic vitals were positive.  Labs were significant for creatinine 1.51, hemoglobin 10.5.  UA was negative.  EKG was unremarkable.  He was given 2 L of normal saline and orthostatic vitals improved.  He continued having orthostatic symptoms and gait instability. Hospitalists were called for admission.  PAST MEDICAL HISTORY:   Past Medical History:  Diagnosis Date  . Anemia   . Anxiety   . Atrial fibrillation ([place])   . Cardiomyopathy ([place])   . CHF (congestive heart failure) ([place])   . Chronic kidney disease    kidney stones  . Coronary artery disease   . Cough   . Dysrhythmia   . Hypertension   . Lymphadenopathy, hilar   . MI (myocardial infarction) ([place])    x 2  . Wheezing     PAST SURGICAL HISTORY:   Past Surgical History:  Procedure Laterality Date  . BRONCHIAL NEEDLE ASPIRATION BIOPSY N/A 12/31/2014   Procedure: BRONCHIAL NEEDLE ASPIRATION BIOPSIES from carina;  Surgeon: Flora Lipps, MD;  Location: [facility] ORS;  Service:  Cardiopulmonary;  Laterality: N/A;  . CARDIAC CATHETERIZATION N/A 12/28/2015   Procedure: Left Heart Cath and Cors/Grafts Angiography;  Surgeon: Yolonda Kida, MD;  Location: [facility];  Service: Cardiovascular;  Laterality: N/A;  . CATARACT EXTRACTION W/PHACO Left 08/01/2016   Procedure: CATARACT EXTRACTION PHACO AND INTRAOCULAR LENS PLACEMENT ([place]) Left;  Surgeon: Leandrew Koyanagi, MD;  Location: [facility];  Service: Ophthalmology;  Laterality: Left;  . CORONARY ANGIOPLASTY WITH STENT PLACEMENT    . CORONARY ARTERY BYPASS GRAFT    . ENDOBRONCHIAL ULTRASOUND N/A 12/31/2014   Procedure: ENDOBRONCHIAL ULTRASOUND;  Surgeon: Flora Lipps, MD;  Location: [facility] ORS;  Service: Cardiopulmonary;  Laterality: N/A;  . ESOPHAGOGASTRODUODENOSCOPY (EGD) WITH PROPOFOL N/A 06/20/2015   Procedure: ESOPHAGOGASTRODUODENOSCOPY (EGD) WITH PROPOFOL;  Surgeon: Lollie Sails, MD;  Location: [facility] ENDOSCOPY;  Service: Endoscopy;  Laterality: N/A;    SOCIAL HISTORY:   Social History   Tobacco Use  . Smoking status: Former Smoker    Quit date: 06/27/1977    Years since quitting: 41.5  . Smokeless tobacco: Current User    Types: Chew  Substance Use Topics  . Alcohol use: Yes    Comment: occational almost rare once a year    FAMILY HISTORY:   Family History  Problem Relation Age of Onset  . CAD Mother   . Colon cancer Father  DRUG ALLERGIES:   Allergies  Allergen Reactions  . 5ht3 Receptor Antagonists   . Diphenhydramine Hcl Other (See Comments)    Reaction:  Rash and fever a long time ago, but has had it since with no problem.  . Escitalopram Other (See Comments)    Reaction:  Makes him feel faint, like he was going to have a heart attack.  . Maxidex [Dexamethasone] Other (See Comments)    Reaction:  Unknown   . Prednisone Other (See Comments)    Pt states that this medication made him feel crazy.    . Serotonin Other (See Comments)    Tried 2 different types, lexapro  and another one.  Reaction:  Made him feel like he was having a heart attack.   . Vytorin [Ezetimibe-Simvastatin] Other (See Comments)    Reaction:  Unknown   . Zocor [Simvastatin] Other (See Comments)    Reaction:  Unknown   . Diltiazem Rash    REVIEW OF SYSTEMS:   Review of Systems  Constitutional: Negative for chills and fever.  HENT: Negative for congestion and sore throat.   Eyes: Negative for blurred vision and double vision.  Respiratory: Negative for cough and shortness of breath.   Cardiovascular: Negative for chest pain and palpitations.  Gastrointestinal: Negative for nausea and vomiting.  Genitourinary: Negative for dysuria and urgency.  Musculoskeletal: Negative for back pain and neck pain.  Neurological: Positive for dizziness. Negative for speech change, focal weakness, seizures, weakness and headaches.  Psychiatric/Behavioral: Negative for depression. The patient is not nervous/anxious.     MEDICATIONS AT HOME:   Prior to Admission medications   Medication Sig Start Date End Date Taking? Authorizing Provider  atorvastatin (LIPITOR) 40 MG tablet Take 1 tablet (40 mg total) by mouth daily at 6 PM. 12/30/15  Yes Dustin Flock, MD  digoxin (LANOXIN) 0.125 MG tablet Take 0.0625 mg by mouth daily.   Yes [provider]  famotidine (PEPCID) 20 MG tablet Take 20 mg by mouth 2 (two) times daily. 09/11/18  Yes [provider]  ferrous sulfate 325 (65 FE) MG tablet Take 1 tablet (325 mg total) by mouth daily. Patient taking differently: Take 325 mg by mouth daily with breakfast.  11/01/15  Yes Dustin Flock, MD  metoprolol tartrate (LOPRESSOR) 25 MG tablet Take 25 mg by mouth 2 (two) times daily. 12/23/18  Yes [provider]  pantoprazole (PROTONIX) 40 MG tablet Take 40 mg by mouth 2 (two) times daily before a meal.    Yes [provider]  ranolazine (RANEXA) 500 MG 12 hr tablet Take 1 tablet (500 mg total) by mouth 2 (two) times daily.  12/30/15  Yes Dustin Flock, MD  sucralfate (CARAFATE) 1 G tablet Take 1 g by mouth 4 (four) times daily.    Yes [provider]  tamsulosin (FLOMAX) 0.4 MG CAPS capsule Take 0.4 mg by mouth daily after supper.    Yes [provider]  vitamin C (VITAMIN C) 250 MG tablet Take 1 tablet (250 mg total) by mouth daily. 11/01/15  Yes Dustin Flock, MD  acetaminophen (TYLENOL) 500 MG tablet Take 500 mg by mouth every 6 (six) hours as needed for mild pain or moderate pain.    [provider]  docusate sodium (COLACE) 100 MG capsule Take 100 mg by mouth daily as needed.     [provider]  furosemide (LASIX) 20 MG tablet Take 40 mg by mouth as needed. For weight gain greater than 2 pounds in  24 hours or greater than 5 pounds in one week    [provider]  nitroGLYCERIN (NITROSTAT) 0.4 MG SL tablet Place 0.4 mg under the tongue every 5 (five) minutes as needed for chest pain.    [provider]      VITAL SIGNS:  Blood pressure 134/87, pulse (!) 50, temperature 98.5 F (36.9 C), temperature source Oral, resp. rate 12, height 6\' 3"  (1.905 m), weight 86.2 kg, SpO2 97 %.  PHYSICAL EXAMINATION:  Physical Exam  GENERAL:  85 y.o.-year-old patient lying in the bed with no acute distress.  EYES: Pupils equal, round, reactive to light and accommodation. No scleral icterus. Extraocular muscles intact.  HEENT: Head atraumatic, normocephalic. Oropharynx and nasopharynx clear.  NECK:  Supple, no jugular venous distention. No thyroid enlargement, no tenderness.  LUNGS: Normal breath sounds bilaterally, no wheezing, rales,rhonchi or crepitation. No use of accessory muscles of respiration.  CARDIOVASCULAR: Mildly bradycardic, regular rhythm, S1, S2 normal. No rubs, or gallops. II/VI systolic murmur. ABDOMEN: Soft, nontender, nondistended. Bowel sounds present. No organomegaly or mass.  EXTREMITIES: No pedal edema, cyanosis, or clubbing.  NEUROLOGIC: Cranial  nerves II through XII are intact. Muscle strength 5/5 in all extremities. Sensation intact. Gait not checked.  PSYCHIATRIC: The patient is alert and oriented x 3.  SKIN: No obvious rash, lesion, or ulcer.   LABORATORY PANEL:   CBC Recent Labs  Lab 01/07/19 0711  WBC 5.1  HGB 10.5*  HCT 32.9*  PLT 104*   ------------------------------------------------------------------------------------------------------------------  Chemistries  Recent Labs  Lab 01/07/19 0711  NA 140  K 4.3  CL 109  CO2 21*  GLUCOSE 99  BUN 24*  CREATININE 1.51*  CALCIUM 8.8*  AST 21  ALT 15  ALKPHOS 59  BILITOT 0.9   ------------------------------------------------------------------------------------------------------------------  Cardiac Enzymes No results for input(s): TROPONINI in the last 168 hours. ------------------------------------------------------------------------------------------------------------------  RADIOLOGY:  No results found.    IMPRESSION AND PLAN:   Orthostatic hypotension / gait instability-may be due to dehydration.  Orthostatics were initially positive, but improved after 2 L of IV fluids. Troponin negative. -Continue gentle IV fluids -Will obtain CT head to assess for stroke, given his gait instability. Can consider MRI brain after that -Check ECHO ([place] to read) -Repeat orthostatic vitals in the morning -PT consult -Cardiac monitoring  Paroxysmal atrial fibrillation-in normal sinus rhythm here. Serum digoxin was normal. -Continue home digoxin, metoprolol -Not on any anticoagulation at home  Chronic systolic congestive heart failure- last ECHO 08/2018 with EF 40% and G1DD -Will need to watch respiratory status closely, given IV fluids -Holding home Lasix for now -Repeat ECHO  CAD with a history of MI x 2- stable, no active chest pain. -Continue home renexa  Hyperlipidemia-stable -Continue home Lipitor  CKD III- creatinine at baseline -Avoid nephrotoxic  agents -Monitor  Chronic iron deficiency anemia- follows with Dr. Rogue Bussing. Hgb better than baseline. -Continue home iron   All the records are reviewed and case discussed with ED provider. Management plans discussed with the patient, family and they are in agreement.  CODE STATUS: Full  TOTAL TIME TAKING CARE OF THIS PATIENT: 45 minutes.    Berna Spare Takeria Marquina M.D on 01/07/2019 at 2:02 PM  Between 7am to 6pm - Pager [phone]  After 6pm go to [email or web address] - Proofreader  [facility]  Office  [phone]  CC: Primary care physician; Rusty Aus, MD   Note: This dictation was prepared with Dragon dictation along with smaller phrase technology.  Any transcriptional errors that result from this process are unintentional.

## 2019-01-07 NOTE — ED Notes (Signed)
Rectal exam performed by Dr. Cinda Quest. Negative for blood.

## 2019-01-07 NOTE — ED Triage Notes (Signed)
Patient arrived from Home by [facility]. Patient complaining of being dizzy that started this morning upon waking. Per patient no history of this. Patient states feeling weak. EMS states v/s WDL. Hx triple bypass 1984 and MI x 2. Heart stents 1997.

## 2019-01-07 NOTE — Telephone Encounter (Signed)
Patient did not keep today's apt in c.ctr. for lab/md/aranesp. Rn reviewed chart. Patient is currently in the ER.

## 2019-01-07 NOTE — ED Notes (Signed)
Pt remains unsteady on his feet and daughter would feel better if pt stayed overnight. MD notified.

## 2019-01-08 ENCOUNTER — Observation Stay
Admit: 2019-01-08 | Discharge: 2019-01-08 | Disposition: A | Discharge status: Home / Self Care | Payer: PPO | Attending: Internal Medicine | Admitting: Internal Medicine

## 2019-01-08 DIAGNOSIS — F419 Anxiety disorder, unspecified: Secondary | ICD-10-CM | POA: Diagnosis present

## 2019-01-08 DIAGNOSIS — N183 Chronic kidney disease, stage 3 (moderate): Secondary | ICD-10-CM | POA: Diagnosis present

## 2019-01-08 DIAGNOSIS — Z955 Presence of coronary angioplasty implant and graft: Secondary | ICD-10-CM | POA: Diagnosis not present

## 2019-01-08 DIAGNOSIS — I252 Old myocardial infarction: Secondary | ICD-10-CM | POA: Diagnosis not present

## 2019-01-08 DIAGNOSIS — I130 Hypertensive heart and chronic kidney disease with heart failure and stage 1 through stage 4 chronic kidney disease, or unspecified chronic kidney disease: Secondary | ICD-10-CM | POA: Diagnosis present

## 2019-01-08 DIAGNOSIS — I951 Orthostatic hypotension: Secondary | ICD-10-CM | POA: Diagnosis present

## 2019-01-08 DIAGNOSIS — F17220 Nicotine dependence, chewing tobacco, uncomplicated: Secondary | ICD-10-CM | POA: Diagnosis present

## 2019-01-08 DIAGNOSIS — I2510 Atherosclerotic heart disease of native coronary artery without angina pectoris: Secondary | ICD-10-CM | POA: Diagnosis present

## 2019-01-08 DIAGNOSIS — R001 Bradycardia, unspecified: Secondary | ICD-10-CM | POA: Diagnosis present

## 2019-01-08 DIAGNOSIS — R2689 Other abnormalities of gait and mobility: Secondary | ICD-10-CM | POA: Diagnosis present

## 2019-01-08 DIAGNOSIS — E785 Hyperlipidemia, unspecified: Secondary | ICD-10-CM | POA: Diagnosis present

## 2019-01-08 DIAGNOSIS — D509 Iron deficiency anemia, unspecified: Secondary | ICD-10-CM | POA: Diagnosis present

## 2019-01-08 DIAGNOSIS — I480 Paroxysmal atrial fibrillation: Secondary | ICD-10-CM | POA: Diagnosis present

## 2019-01-08 DIAGNOSIS — K219 Gastro-esophageal reflux disease without esophagitis: Secondary | ICD-10-CM | POA: Diagnosis present

## 2019-01-08 DIAGNOSIS — I509 Heart failure, unspecified: Secondary | ICD-10-CM | POA: Diagnosis not present

## 2019-01-08 DIAGNOSIS — I5022 Chronic systolic (congestive) heart failure: Secondary | ICD-10-CM | POA: Diagnosis present

## 2019-01-08 DIAGNOSIS — Z8249 Family history of ischemic heart disease and other diseases of the circulatory system: Secondary | ICD-10-CM | POA: Diagnosis not present

## 2019-01-08 DIAGNOSIS — Z20828 Contact with and (suspected) exposure to other viral communicable diseases: Secondary | ICD-10-CM | POA: Diagnosis present

## 2019-01-08 DIAGNOSIS — Z800 Family history of malignant neoplasm of digestive organs: Secondary | ICD-10-CM | POA: Diagnosis not present

## 2019-01-08 LAB — ECHOCARDIOGRAM COMPLETE
Height: 75 in
Weight: 3040 oz

## 2019-01-08 LAB — CBC
HCT: 29.1 % — ABNORMAL LOW (ref 39.0–52.0)
Hemoglobin: 9.3 g/dL — ABNORMAL LOW (ref 13.0–17.0)
MCH: 33.7 pg (ref 26.0–34.0)
MCHC: 32 g/dL (ref 30.0–36.0)
MCV: 105.4 fL — ABNORMAL HIGH (ref 80.0–100.0)
Platelets: 125 10*3/uL — ABNORMAL LOW (ref 150–400)
RBC: 2.76 MIL/uL — ABNORMAL LOW (ref 4.22–5.81)
RDW: 17.1 % — ABNORMAL HIGH (ref 11.5–15.5)
WBC: 6.3 10*3/uL (ref 4.0–10.5)
nRBC: 0 % (ref 0.0–0.2)

## 2019-01-08 LAB — BASIC METABOLIC PANEL
Anion gap: 7 (ref 5–15)
BUN: 21 mg/dL (ref 8–23)
CO2: 17 mmol/L — ABNORMAL LOW (ref 22–32)
Calcium: 7.9 mg/dL — ABNORMAL LOW (ref 8.9–10.3)
Chloride: 114 mmol/L — ABNORMAL HIGH (ref 98–111)
Creatinine, Ser: 1.44 mg/dL — ABNORMAL HIGH (ref 0.61–1.24)
GFR calc Af Amer: 51 mL/min — ABNORMAL LOW (ref >60)
GFR calc non Af Amer: 44 mL/min — ABNORMAL LOW (ref >60)
Glucose, Bld: 91 mg/dL (ref 70–99)
Potassium: 4.6 mmol/L (ref 3.5–5.1)
Sodium: 138 mmol/L (ref 135–145)

## 2019-01-08 LAB — NOVEL CORONAVIRUS, NAA (HOSP ORDER, SEND-OUT TO REF LAB; TAT 18-24 HRS): SARS-CoV-2, NAA: NOT DETECTED

## 2019-01-08 MED ORDER — MAGNESIUM SULFATE 2 GM/50ML IV SOLN
2.0000 g | Freq: Once | INTRAVENOUS | Status: AC
  Administered 2019-01-08: 2 g via INTRAVENOUS
  Filled 2019-01-08: qty 50

## 2019-01-08 NOTE — Care Management Obs Status (Signed)
[place] NOTIFICATION   Patient Details  Name: Andres Gutierrez MRN: [id number] Date of Birth: 08/31/1930   Medicare Observation Status Notification Given:  Yes    Shelbie Hutching, RN 01/08/2019, 3:38 PM

## 2019-01-08 NOTE — Evaluation (Signed)
Physical Therapy Evaluation Patient Details Name: Andres Gutierrez MRN: [id number] DOB: 1931/04/02 Today's Date: 01/08/2019   History of Present Illness  Andres Gutierrez is an 87yoM who comes [place] on 8/26 after orthostatic dizziness.  Clinical Impression  Pt admitted with above diagnosis. Pt currently with functional limitations due to the deficits listed below (see "PT Problem List"). Upon entry, pt in bed, awake and agreeable to participate. The pt is alert and oriented x4, pleasant, conversational, and generally a good historian. Orthos taken and negative. Pt attests to baseline functional mobility without symptoms. Patient is at baseline, all education completed, and time is given to address all questions/concerns. No additional skilled PT services needed at this time, PT signing off. PT recommends daily ambulation ad lib or with nursing staff as needed to prevent deconditioning.      Follow Up Recommendations No PT follow up    Equipment Recommendations  None recommended by PT    Recommendations for Other Services Rehab consult     Precautions / Restrictions Precautions Precautions: Fall      Mobility  Bed Mobility Overal bed mobility: Independent                Transfers Overall transfer level: Modified independent Equipment used: Rolling walker (2 wheeled)             General transfer comment: moderate effort, but no assist need  Ambulation/Gait   Gait Distance (Feet): 150 Feet Assistive device: Rolling walker (2 wheeled) Gait Pattern/deviations: WFL(Within Functional Limits)     General Gait Details: At baseline, no symptoms. Manages RW well without gait instability.  Stairs            Wheelchair Mobility    Modified Rankin (Stroke Patients Only)       Balance Overall balance assessment: Modified Independent;No apparent balance deficits (not formally assessed)                                           Pertinent Vitals/Pain  Pain Assessment: No/denies pain    Home Living Family/patient expects to be discharged to:: Private residence Living Arrangements: Alone Available Help at Discharge: Family Type of Home: House Home Access: Stairs to enter Entrance Stairs-Rails: Left Entrance Stairs-Number of Steps: 3   Home Equipment: Bath - 2 wheels;Bedside commode;Shower seat;Cane - single point      Prior Function Level of Independence: Needs assistance   Gait / Transfers Assistance Needed: independent ADL; limited community distance AMB with SPC/RW when out of home; no falls history, still drives minimaqlly  ADL's / Homemaking Assistance Needed: DTR assists with groceries, meds; pt stil drives.        Hand Dominance        Extremity/Trunk Assessment   Upper Extremity Assessment Upper Extremity Assessment: Overall WFL for tasks assessed    Lower Extremity Assessment Lower Extremity Assessment: Overall WFL for tasks assessed    Cervical / Trunk Assessment Cervical / Trunk Assessment: Normal  Communication   Communication: HOH  Cognition Arousal/Alertness: Awake/alert Behavior During Therapy: WFL for tasks assessed/performed Overall Cognitive Status: Within Functional Limits for tasks assessed                                        General Comments      Exercises  Assessment/Plan    PT Assessment Patent does not need any further PT services  PT Problem List Decreased activity tolerance;Decreased mobility       PT Treatment Interventions Functional mobility training    PT Goals (Current goals can be found in the Care Plan section)  Acute Rehab PT Goals PT Goal Formulation: All assessment and education complete, DC therapy    Frequency     Barriers to discharge        Co-evaluation               AM-PAC PT "6 Clicks" Mobility  Outcome Measure Help needed turning from your back to your side while in a flat bed without using bedrails?: None Help  needed moving from lying on your back to sitting on the side of a flat bed without using bedrails?: None Help needed moving to and from a bed to a chair (including a wheelchair)?: None Help needed standing up from a chair using your arms (e.g., wheelchair or bedside chair)?: None Help needed to walk in hospital room?: None Help needed climbing 3-5 steps with a railing? : None 6 Click Score: 24    End of Session   Activity Tolerance: Patient tolerated treatment well;No increased pain Patient left: in chair;with family/visitor present Nurse Communication: Mobility status PT Visit Diagnosis: Dizziness and giddiness (R42);Other symptoms and signs involving the nervous system (R29.898)    Time: 4193-7902 PT Time Calculation (min) (ACUTE ONLY): 23 min   Charges:   PT Evaluation $PT Eval Low Complexity: 1 Low         1:28 PM, 01/08/19 Etta Grandchild, PT, DPT Physical Therapist - [facility]  [phone] ([place])   [place] C 01/08/2019, 1:26 PM

## 2019-01-08 NOTE — Progress Notes (Signed)
   01/08/19 1022  Vitals  BP (!) 125/55  MAP (mmHg) 75  BP Method Automatic  Pulse Rate (!) 49  MEWS Score  MEWS RR 0  MEWS Pulse 1  MEWS Systolic 0  MEWS LOC 0  MEWS Temp 0  MEWS Score 1  MEWS Score Color Green  Dr. Margaretmary Eddy aware of current HR & BP, hold metoprolol but give digoxin per Dr. Margaretmary Eddy.  Clarise Cruz, BSN

## 2019-01-08 NOTE — Progress Notes (Signed)
*  PRELIMINARY RESULTS* Echocardiogram 2D Echocardiogram has been performed.  Andres Gutierrez 01/08/2019, 9:19 AM

## 2019-01-08 NOTE — Progress Notes (Signed)
[place] at [place] NAME: Andres Gutierrez    MR#:  [id number]  DATE OF BIRTH:  14-Mar-1931  SUBJECTIVE:  CHIEF COMPLAINT: Patient denies any dizziness while resting.  Lightheaded with some movements.  Daughter at bedside denies any chest pain or shortness of  REVIEW OF SYSTEMS:  CONSTITUTIONAL: No fever, fatigue or weakness.  EYES: No blurred or double vision.  EARS, NOSE, AND THROAT: No tinnitus or ear pain.  RESPIRATORY: No cough, shortness of breath, wheezing or hemoptysis.  CARDIOVASCULAR: No chest pain, orthopnea, edema.  GASTROINTESTINAL: No nausea, vomiting, diarrhea or abdominal pain.  GENITOURINARY: No dysuria, hematuria.  ENDOCRINE: No polyuria, nocturia,  HEMATOLOGY: No anemia, easy bruising or bleeding SKIN: No rash or lesion. MUSCULOSKELETAL: No joint pain or arthritis.   NEUROLOGIC: No tingling, numbness, weakness.  PSYCHIATRY: No anxiety or depression.   DRUG ALLERGIES:   Allergies  Allergen Reactions  . 5ht3 Receptor Antagonists   . Diphenhydramine Hcl Other (See Comments)    Reaction:  Rash and fever a long time ago, but has had it since with no problem.  . Escitalopram Other (See Comments)    Reaction:  Makes him feel faint, like he was going to have a heart attack.  . Maxidex [Dexamethasone] Other (See Comments)    Reaction:  Unknown   . Prednisone Other (See Comments)    Pt states that this medication made him feel crazy.    . Serotonin Other (See Comments)    Tried 2 different types, lexapro and another one.  Reaction:  Made him feel like he was having a heart attack.   . Vytorin [Ezetimibe-Simvastatin] Other (See Comments)    Reaction:  Unknown   . Zocor [Simvastatin] Other (See Comments)    Reaction:  Unknown   . Diltiazem Rash    VITALS:  Blood pressure (!) 125/55, pulse (!) 49, temperature 97.6 F (36.4 C), resp. rate 16, height 6\' 3"  (1.905 m), weight 86.2 kg, SpO2 97 %.  PHYSICAL EXAMINATION:   GENERAL:  85 y.o.-year-old patient lying in the bed with no acute distress.  EYES: Pupils equal, round, reactive to light and accommodation. No scleral icterus. Extraocular muscles intact.  HEENT: Head atraumatic, normocephalic. Oropharynx and nasopharynx clear.  NECK:  Supple, no jugular venous distention. No thyroid enlargement, no tenderness.  LUNGS: Normal breath sounds bilaterally, no wheezing, rales,rhonchi or crepitation. No use of accessory muscles of respiration.  CARDIOVASCULAR: S1, S2 normal. No murmurs, rubs, or gallops.  ABDOMEN: Soft, nontender, nondistended. Bowel sounds present.  EXTREMITIES: No pedal edema, cyanosis, or clubbing.  NEUROLOGIC: Cranial nerves II through XII are intact. Muscle strength 5/5 in all extremities. Sensation intact. Gait not checked.  PSYCHIATRIC: The patient is alert and oriented x 3.  SKIN: No obvious rash, lesion, or ulcer.    LABORATORY PANEL:   CBC Recent Labs  Lab 01/08/19 0555  WBC 6.3  HGB 9.3*  HCT 29.1*  PLT 125*   ------------------------------------------------------------------------------------------------------------------  Chemistries  Recent Labs  Lab 01/07/19 0711 01/08/19 0428  NA 140 138  K 4.3 4.6  CL 109 114*  CO2 21* 17*  GLUCOSE 99 91  BUN 24* 21  CREATININE 1.51* 1.44*  CALCIUM 8.8* 7.9*  MG 1.5*  --   AST 21  --   ALT 15  --   ALKPHOS 59  --   BILITOT 0.9  --    ------------------------------------------------------------------------------------------------------------------  Cardiac Enzymes No results for input(s): TROPONINI in the last 168  hours. ------------------------------------------------------------------------------------------------------------------  RADIOLOGY:  Ct Head Wo Contrast  Result Date: 01/07/2019 CLINICAL DATA:  Ataxia. Dizziness beginning this morning. EXAM: CT HEAD WITHOUT CONTRAST TECHNIQUE: Contiguous axial images were obtained from the base of the skull through the  vertex without intravenous contrast. COMPARISON:  11/03/2015 FINDINGS: Brain: There is no evidence of acute infarct, intracranial hemorrhage, mass, midline shift, or extra-axial fluid collection. Cerebral white matter hypodensities are stable to mildly increased and nonspecific but compatible with mild-to-moderate chronic small vessel ischemic disease. Mild cerebral atrophy is not greater than expected for age. Vascular: Calcified atherosclerosis at the skull base. No hyperdense vessel. Skull: No fracture or focal osseous lesion. Sinuses/Orbits: Visualized paranasal sinuses and mastoid air cells are clear. Bilateral cataract extraction is noted. Other: None. IMPRESSION: 1. No evidence of acute intracranial abnormality. 2. Mild-to-moderate chronic small vessel ischemic disease. Electronically Signed   By: Logan Bores M.D.   On: 01/07/2019 15:49    EKG:   Orders placed or performed during the hospital encounter of 01/07/19  . ED EKG  . ED EKG    ASSESSMENT AND PLAN:  Orthostatic hypotension / gait instability-may be due to dehydration.  Orthostatics were initially positive, but improved after 2 L of IV fluids. Troponin negative. -Hydration with IV fluids -CT head is negative -ECHO ([place] to read) study done results are pending -Repeat orthostatic vitals in the morning -PT consult-no PT needs identified -Cardiac monitoring  Paroxysmal atrial fibrillation-in normal sinus rhythm here. Serum digoxin was normal. -Continue home digoxin, metoprolol -Not on any anticoagulation at home  Chronic systolic congestive heart failure- last ECHO 08/2018 with EF 40% and G1DD -Will need to watch respiratory status closely, given IV fluids -Holding home Lasix for now -Repeat ECHO  CAD with a history of MI x 2- stable, no active chest pain. -Continue home renexa  Hyperlipidemia-stable -Continue home Lipitor  CKD III- creatinine at baseline -Avoid nephrotoxic agents -Monitor  Chronic iron  deficiency anemia- follows with Dr. Rogue Bussing. Hgb better than baseline. -Continue home iron      All the records are reviewed and case discussed with Care Management/Social Workerr. Management plans discussed with the patient, daughter at bedside and they are in agreement.  CODE STATUS: fc   TOTAL TIME TAKING CARE OF THIS PATIENT: 36 minutes.   POSSIBLE D/C IN  1 DAYS, DEPENDING ON CLINICAL CONDITION.  Note: This dictation was prepared with Dragon dictation along with smaller phrase technology. Any transcriptional errors that result from this process are unintentional.   Nicholes Mango M.D on 01/08/2019 at 3:12 PM  Between 7am to 6pm - Pager - [phone] After 6pm go to [email or web address] - password [email or web address] [facility]  Office  [phone]  CC: Primary care physician; Rusty Aus, MD

## 2019-01-08 NOTE — Progress Notes (Signed)
   01/08/19 1135  Therapy Vitals  Patient Position (if appropriate) Orthostatic Vitals  Orthostatic Lying   BP- Lying 117/52  Pulse- Lying (!) 49  Orthostatic Sitting  BP- Sitting 131/54  Pulse- Sitting 58  Orthostatic Standing at 0 minutes  BP- Standing at 0 minutes 124/53 (denies any symptoms with postural changes.)  Pulse- Standing at 0 minutes 69   Orthos taken during PT evaluation. No BP drop. NO symptoms.   11:37 AM, 01/08/19 Etta Grandchild, PT, DPT Physical Therapist - [facility]  [phone] [facility])

## 2019-01-09 DIAGNOSIS — I480 Paroxysmal atrial fibrillation: Secondary | ICD-10-CM | POA: Diagnosis not present

## 2019-01-09 DIAGNOSIS — R001 Bradycardia, unspecified: Secondary | ICD-10-CM | POA: Diagnosis not present

## 2019-01-09 DIAGNOSIS — I951 Orthostatic hypotension: Secondary | ICD-10-CM | POA: Diagnosis not present

## 2019-01-09 DIAGNOSIS — I509 Heart failure, unspecified: Secondary | ICD-10-CM | POA: Diagnosis not present

## 2019-01-09 LAB — BASIC METABOLIC PANEL
Anion gap: 6 (ref 5–15)
BUN: 19 mg/dL (ref 8–23)
CO2: 22 mmol/L (ref 22–32)
Calcium: 8.1 mg/dL — ABNORMAL LOW (ref 8.9–10.3)
Chloride: 113 mmol/L — ABNORMAL HIGH (ref 98–111)
Creatinine, Ser: 1.38 mg/dL — ABNORMAL HIGH (ref 0.61–1.24)
GFR calc Af Amer: 54 mL/min — ABNORMAL LOW (ref >60)
GFR calc non Af Amer: 46 mL/min — ABNORMAL LOW (ref >60)
Glucose, Bld: 95 mg/dL (ref 70–99)
Potassium: 4.1 mmol/L (ref 3.5–5.1)
Sodium: 141 mmol/L (ref 135–145)

## 2019-01-09 LAB — CBC
HCT: 30.1 % — ABNORMAL LOW (ref 39.0–52.0)
Hemoglobin: 9.5 g/dL — ABNORMAL LOW (ref 13.0–17.0)
MCH: 33.7 pg (ref 26.0–34.0)
MCHC: 31.6 g/dL (ref 30.0–36.0)
MCV: 106.7 fL — ABNORMAL HIGH (ref 80.0–100.0)
Platelets: UNDETERMINED 10*3/uL (ref 150–400)
RBC: 2.82 MIL/uL — ABNORMAL LOW (ref 4.22–5.81)
RDW: 16.8 % — ABNORMAL HIGH (ref 11.5–15.5)
WBC: 5.9 10*3/uL (ref 4.0–10.5)
nRBC: 0 % (ref 0.0–0.2)

## 2019-01-09 MED ORDER — POLYETHYLENE GLYCOL 3350 17 G PO PACK: 17.0000 g | PACK | Freq: Every day | ORAL | 0 refills | Status: DC | PRN

## 2019-01-09 MED ORDER — METOPROLOL TARTRATE 25 MG PO TABS: 12.5000 mg | ORAL_TABLET | Freq: Two times a day (BID) | ORAL | 0 refills | Status: DC

## 2019-01-09 NOTE — Progress Notes (Addendum)
Dr Margaretmary Eddy aware that tele monitoring reports HR down to 47, MD states that she has adjusted his metoprolol and that pt will follow up with cardiology outpt, pt asymptomatic

## 2019-01-09 NOTE — Discharge Summary (Signed)
[facility] NAME: Andres Gutierrez    MR#:  [id number]  DATE OF BIRTH:  1930/08/24  DATE OF ADMISSION:  01/07/2019 ADMITTING PHYSICIAN: Sela Hua, MD  DATE OF DISCHARGE:  01/09/19   PRIMARY CARE PHYSICIAN: Andres Aus, MD    ADMISSION DIAGNOSIS:  Bradycardia [R00.1] Near syncope [R55]  DISCHARGE DIAGNOSIS:  Active Problems:   Orthostatic hypotension Sinus bradycardia  SECONDARY DIAGNOSIS:   Past Medical History:  Diagnosis Date  . Anemia   . Anxiety   . Atrial fibrillation ([place])   . Cardiomyopathy ([place])   . CHF (congestive heart failure) ([place])   . Chronic kidney disease    kidney stones  . Coronary artery disease   . Cough   . Dysrhythmia   . Hypertension   . Lymphadenopathy, hilar   . MI (myocardial infarction) ([place])    x 2  . Wheezing     HOSPITAL COURSE:   Andres Gutierrez  is a 85 y.o. male with a known history of paroxysmal atrial fibrillation, CHF, CKD, CAD with history of MI x2, anxiety who presented to the ED with lightheadedness.  Patient states he became lightheaded and felt like he was going to pass out every time that he stood up.  His symptoms started this morning.  He denies any chest pain or shortness of breath.  He states that he is "shaky and nervous".  He denies any weakness of his arms or legs.  He does have intermittent right leg numbness that comes and goes over the last couple of months.  In the ED, was mildly hypertensive.  Orthostatic vitals were positive.  Labs were significant for creatinine 1.51, hemoglobin 10.5.  UA was negative.  EKG was unremarkable.  He was given 2 L of normal saline and orthostatic vitals improved.  He continued having orthostatic symptoms and gait instability. Hospitalists were called for admission.  Orthostatic hypotension/ gait instability-may be due to dehydration. Orthostatics were initially positive, but improved after 2 L of IV fluids. Troponin  negative. -Hydration with IV fluids provided during the hospital course -CT head is negative -ECHO ([place] to read) study done reveals 35 to 40% ejection fraction -Patient is asymptomatic today morning after IV fluids are given -PT consult-no PT needs identified -Cardiac monitoring-telemetry strips were reviewed by cardiology Dr. Nehemiah Massed recommended to reduce the dose of metoprolol to 12.5 mg p.o. twice daily from 25 mg p.o. twice daily given the sinus bradycardia but otherwise the pauses noticed by the telemetry were insignificant from cardiology standpoint and okay to discharge patient from cardiology standpoint  Paroxysmal atrial fibrillation with asymptomatic sinus bradycardia-in normal sinus rhythm here. Serum digoxin was normal. -Continue home digoxin, metoprolol -Not on any anticoagulation at home deferred to patient's primary cardiologist Dr. Ubaldo Glassing -Remote telemetry noticed some pauses and PVCs, discussed with cardiologist Dr. Montine Circle who has thoroughly reviewed the strip and felt comfortable to discharge the patient from cardiology standpoint.  Will decrease the metoprolol dose to 12.5 mg p.o. twice daily and outpatient follow-up with Dr. Ubaldo Glassing on Monday  Chronic systolic congestive heart failure- last ECHO 4/2020with EF 40% andG1DD -Will need to watch respiratory status closely, given IV fluids -Resume home Lasix for now -Repeat ECHO results as dictated above  CAD with a history of MI x 2- stable, no active chest pain. -Continue home renexa  Hyperlipidemia-stable -Continue home Lipitor  CKD III- creatinine at baseline -Avoid nephrotoxic agents -Monitor closely  Chronic iron deficiency  anemia- follows with Dr. Rogue Bussing. Hgb better than baseline. -Continue home iron  Plan of care discussed in detail with the patient and his daughter [name] over phone.  They both verbalized understanding of the plan.  Okay to discharge patient from cardiology standpoint  DISCHARGE  CONDITIONS:   FAIR   CONSULTS OBTAINED:  Treatment Team:  Corey Skains, MD   PROCEDURES  NONE   DRUG ALLERGIES:   Allergies  Allergen Reactions  . 5ht3 Receptor Antagonists   . Diphenhydramine Hcl Other (See Comments)    Reaction:  Rash and fever a long time ago, but has had it since with no problem.  . Escitalopram Other (See Comments)    Reaction:  Makes him feel faint, like he was going to have a heart attack.  . Maxidex [Dexamethasone] Other (See Comments)    Reaction:  Unknown   . Prednisone Other (See Comments)    Pt states that this medication made him feel crazy.    . Serotonin Other (See Comments)    Tried 2 different types, lexapro and another one.  Reaction:  Made him feel like he was having a heart attack.   . Vytorin [Ezetimibe-Simvastatin] Other (See Comments)    Reaction:  Unknown   . Zocor [Simvastatin] Other (See Comments)    Reaction:  Unknown   . Diltiazem Rash    DISCHARGE MEDICATIONS:   Allergies as of 01/09/2019      Reactions   5ht3 Receptor Antagonists    Diphenhydramine Hcl Other (See Comments)   Reaction:  Rash and fever a long time ago, but has had it since with no problem.   Escitalopram Other (See Comments)   Reaction:  Makes him feel faint, like he was going to have a heart attack.   Maxidex [dexamethasone] Other (See Comments)   Reaction:  Unknown    Prednisone Other (See Comments)   Pt states that this medication made him feel crazy.     Serotonin Other (See Comments)   Tried 2 different types, lexapro and another one.  Reaction:  Made him feel like he was having a heart attack.    Vytorin [ezetimibe-simvastatin] Other (See Comments)   Reaction:  Unknown    Zocor [simvastatin] Other (See Comments)   Reaction:  Unknown    Diltiazem Rash      Medication List    TAKE these medications   acetaminophen 500 MG tablet Commonly known as: TYLENOL Take 500 mg by mouth every 6 (six) hours as needed for mild pain or moderate  pain.   ascorbic acid 250 MG tablet Commonly known as: VITAMIN C Take 1 tablet (250 mg total) by mouth daily.   atorvastatin 40 MG tablet Commonly known as: LIPITOR Take 1 tablet (40 mg total) by mouth daily at 6 PM.   digoxin 0.125 MG tablet Commonly known as: LANOXIN Take 0.0625 mg by mouth daily.   docusate sodium 100 MG capsule Commonly known as: COLACE Take 100 mg by mouth daily as needed.   famotidine 20 MG tablet Commonly known as: PEPCID Take 20 mg by mouth 2 (two) times daily.   ferrous sulfate 325 (65 FE) MG tablet Take 1 tablet (325 mg total) by mouth daily. What changed: when to take this   furosemide 20 MG tablet Commonly known as: LASIX Take 40 mg by mouth as needed. For weight gain greater than 2 pounds in 24 hours or greater than 5 pounds in one week   metoprolol tartrate 25 MG tablet  Commonly known as: LOPRESSOR Take 0.5 tablets (12.5 mg total) by mouth 2 (two) times daily. What changed: how much to take   nitroGLYCERIN 0.4 MG SL tablet Commonly known as: NITROSTAT Place 0.4 mg under the tongue every 5 (five) minutes as needed for chest pain.   pantoprazole 40 MG tablet Commonly known as: PROTONIX Take 40 mg by mouth 2 (two) times daily before a meal.   polyethylene glycol 17 g packet Commonly known as: MIRALAX / GLYCOLAX Take 17 g by mouth daily as needed for mild constipation.   ranolazine 500 MG 12 hr tablet Commonly known as: Ranexa Take 1 tablet (500 mg total) by mouth 2 (two) times daily.   sucralfate 1 g tablet Commonly known as: CARAFATE Take 1 g by mouth 4 (four) times daily.   tamsulosin 0.4 MG Caps capsule Commonly known as: FLOMAX Take 0.4 mg by mouth daily after supper.        DISCHARGE INSTRUCTIONS:  Follow-up with primary care physician in 2 to 3 days Follow-up with cardiology Dr. Ubaldo Glassing on Monday, August 31 Follow-up with CHF clinic/cardiac rehab in 2 to 3 days Daily weight monitoring, intake and output DIET:  Cardiac  diet  DISCHARGE CONDITION:  Fair  ACTIVITY:  Activity as tolerated  OXYGEN:  Home Oxygen: No.   Oxygen Delivery: room air  DISCHARGE LOCATION:  home   If you experience worsening of your admission symptoms, develop shortness of breath, life threatening emergency, suicidal or homicidal thoughts you must seek medical attention immediately by calling 911 or calling your MD immediately  if symptoms less severe.  You Must read complete instructions/literature along with all the possible adverse reactions/side effects for all the Medicines you take and that have been prescribed to you. Take any new Medicines after you have completely understood and accpet all the possible adverse reactions/side effects.   Please note  You were cared for by a hospitalist during your hospital stay. If you have any questions about your discharge medications or the care you received while you were in the hospital after you are discharged, you can call the unit and asked to speak with the hospitalist on call if the hospitalist that took care of you is not available. Once you are discharged, your primary care physician will handle any further medical issues. Please note that NO REFILLS for any discharge medications will be authorized once you are discharged, as it is imperative that you return to your primary care physician (or establish a relationship with a primary care physician if you do not have one) for your aftercare needs so that they can reassess your need for medications and monitor your lab values.     Today  Chief Complaint  Patient presents with  . Dizziness   Patient is feeling fine.  Denies any dizziness or shortness of breath.  Denies any chest pain.  Wants to go home.  Plan of care discussed with the patient and daughter [name] over phone.  Discussed with cardiology Dr. Montine Circle over phone okay to discharge from cardiology standpoint  ROS:  CONSTITUTIONAL: Denies fevers, chills. Denies any  fatigue, weakness.  EYES: Denies blurry vision, double vision, eye pain. EARS, NOSE, THROAT: Denies tinnitus, ear pain, hearing loss. RESPIRATORY: Denies cough, wheeze, shortness of breath.  CARDIOVASCULAR: Denies chest pain, palpitations, edema.  GASTROINTESTINAL: Denies nausea, vomiting, diarrhea, abdominal pain. Denies bright red blood per rectum. GENITOURINARY: Denies dysuria, hematuria. ENDOCRINE: Denies nocturia or thyroid problems. HEMATOLOGIC AND LYMPHATIC: Denies easy bruising or bleeding.  SKIN: Denies rash or lesion. MUSCULOSKELETAL: Denies pain in neck, back, shoulder, knees, hips or arthritic symptoms.  NEUROLOGIC: Denies paralysis, paresthesias.  PSYCHIATRIC: Denies anxiety or depressive symptoms.   VITAL SIGNS:  Blood pressure (!) 166/66, pulse 60, temperature (!) 97.5 F (36.4 C), temperature source Oral, resp. rate 18, height 6\' 3"  (1.905 m), weight 86.2 kg, SpO2 98 %.  I/O:    Intake/Output Summary (Last 24 hours) at 01/09/2019 1053 Last data filed at 01/09/2019 1005 Gross per 24 hour  Intake -  Output 1650 ml  Net -1650 ml    PHYSICAL EXAMINATION:  GENERAL:  85 y.o.-year-old patient lying in the bed with no acute distress.  EYES: Pupils equal, round, reactive to light and accommodation. No scleral icterus. Extraocular muscles intact.  HEENT: Head atraumatic, normocephalic. Oropharynx and nasopharynx clear.  NECK:  Supple, no jugular venous distention. No thyroid enlargement, no tenderness.  LUNGS: Normal breath sounds bilaterally, no wheezing, rales,rhonchi or crepitation. No use of accessory muscles of respiration.  CARDIOVASCULAR: S1, S2 normal. No murmurs, rubs, or gallops.  ABDOMEN: Soft, non-tender, non-distended. Bowel sounds present.  EXTREMITIES: No pedal edema, cyanosis, or clubbing.  NEUROLOGIC: Cranial nerves II through XII are intact. Muscle strength at baseline in all extremities. Sensation intact. Gait not checked.  PSYCHIATRIC: The patient is  alert and oriented x 3.  SKIN: No obvious rash, lesion, or ulcer.   DATA REVIEW:   CBC Recent Labs  Lab 01/09/19 0404  WBC 5.9  HGB 9.5*  HCT 30.1*  PLT PLATELET CLUMPS NOTED ON SMEAR, UNABLE TO ESTIMATE    Chemistries  Recent Labs  Lab 01/07/19 0711  01/09/19 0404  NA 140   < > 141  K 4.3   < > 4.1  CL 109   < > 113*  CO2 21*   < > 22  GLUCOSE 99   < > 95  BUN 24*   < > 19  CREATININE 1.51*   < > 1.38*  CALCIUM 8.8*   < > 8.1*  MG 1.5*  --   --   AST 21  --   --   ALT 15  --   --   ALKPHOS 59  --   --   BILITOT 0.9  --   --    < > = values in this interval not displayed.    Cardiac Enzymes No results for input(s): TROPONINI in the last 168 hours.  Microbiology Results  Results for orders placed or performed during the hospital encounter of 01/07/19  Novel Coronavirus, NAA (Hosp order, Send-out to Ref Lab; TAT 18-24 hrs     Status: None   Collection Time: 01/07/19  9:36 AM   Specimen: Nasopharyngeal Swab; Respiratory  Result Value Ref Range Status   SARS-CoV-2, NAA NOT DETECTED NOT DETECTED Final    Comment: (NOTE) This test was developed and its performance characteristics determined by [employer]. This test has not been FDA cleared or approved. This test has been authorized by FDA under an Emergency Use Authorization (EUA). This test is only authorized for the duration of time the declaration that circumstances exist justifying the authorization of the emergency use of in vitro diagnostic tests for detection of SARS-CoV-2 virus and/or diagnosis of COVID-19 infection under section 564(b)(1) of the Act, 21 U.S.C. 470JGG-8(Z)(6), unless the authorization is terminated or revoked sooner. When diagnostic testing is negative, the possibility of a false negative result should be considered in the context of a patient's recent exposures and  the presence of clinical signs and symptoms consistent with COVID-19. An individual without symptoms of COVID-19  and who is not shedding SARS-CoV-2 virus would expect to have a negative (not detected) result in this assay. Performed  At: [facility] [address] [place], [place] [id number] Rush Farmer MD KC:[id number]    [place]  Final    Comment: Performed at [facility], [place], [place] [address]    RADIOLOGY:  Ct Head Wo Contrast  Result Date: 01/07/2019 CLINICAL DATA:  Ataxia. Dizziness beginning this morning. EXAM: CT HEAD WITHOUT CONTRAST TECHNIQUE: Contiguous axial images were obtained from the base of the skull through the vertex without intravenous contrast. COMPARISON:  11/03/2015 FINDINGS: Brain: There is no evidence of acute infarct, intracranial hemorrhage, mass, midline shift, or extra-axial fluid collection. Cerebral white matter hypodensities are stable to mildly increased and nonspecific but compatible with mild-to-moderate chronic small vessel ischemic disease. Mild cerebral atrophy is not greater than expected for age. Vascular: Calcified atherosclerosis at the skull base. No hyperdense vessel. Skull: No fracture or focal osseous lesion. Sinuses/Orbits: Visualized paranasal sinuses and mastoid air cells are clear. Bilateral cataract extraction is noted. Other: None. IMPRESSION: 1. No evidence of acute intracranial abnormality. 2. Mild-to-moderate chronic small vessel ischemic disease. Electronically Signed   By: Logan Bores M.D.   On: 01/07/2019 15:49    EKG:   Orders placed or performed during the hospital encounter of 01/07/19  . ED EKG  . ED EKG      Management plans discussed with the patient, family and they are in agreement.  CODE STATUS:     Code Status Orders  (From admission, onward)         Start     Ordered   01/07/19 1515  Full code  Continuous     01/07/19 1514        Code Status History    Date Active Date Inactive Code Status Order ID Comments User Context   01/24/2016 1704 01/26/2016  2045 Full Code [id number]  Domenic Polite, MD Inpatient   12/26/2015 1317 01/02/2016 1650 Full Code [id number]  Bettey Costa, MD ED   12/23/2015 2246 12/24/2015 0835 Full Code [id number]  Lance Coon, MD Inpatient   11/03/2015 1758 11/05/2015 1905 Full Code [id number]  Hower, Aaron Mose, MD ED   10/29/2015 0108 11/01/2015 1605 Full Code [id number]  Alesia Richards, MD ED   12/22/2014 0223 12/24/2014 1413 Full Code [id number]  Juluis Mire, MD Inpatient   Advance Care Planning Activity    Advance Directive Documentation     Most Recent Value  Type of Advance Directive  Healthcare Power of Attorney  Pre-existing out of facility DNR order (yellow form or pink MOST form)  -  "MOST" Form in Place?  -      TOTAL TIME TAKING CARE OF THIS PATIENT: 43  minutes.   Note: This dictation was prepared with Dragon dictation along with smaller phrase technology. Any transcriptional errors that result from this process are unintentional.   @[facility] @  on 01/09/2019 at 10:53 AM  Between 7am to 6pm - Pager - [phone]  After 6pm go to [email or web address] - password [email or web address] [facility]  Office  [phone]  CC: Primary care physician; Andres Aus, MD

## 2019-01-14 DIAGNOSIS — I5022 Chronic systolic (congestive) heart failure: Secondary | ICD-10-CM | POA: Diagnosis not present

## 2019-01-14 DIAGNOSIS — I2510 Atherosclerotic heart disease of native coronary artery without angina pectoris: Secondary | ICD-10-CM | POA: Diagnosis not present

## 2019-01-14 DIAGNOSIS — I950 Idiopathic hypotension: Secondary | ICD-10-CM | POA: Diagnosis not present

## 2019-01-15 DIAGNOSIS — I429 Cardiomyopathy, unspecified: Secondary | ICD-10-CM | POA: Diagnosis not present

## 2019-01-15 DIAGNOSIS — I4819 Other persistent atrial fibrillation: Secondary | ICD-10-CM | POA: Diagnosis not present

## 2019-01-15 DIAGNOSIS — E782 Mixed hyperlipidemia: Secondary | ICD-10-CM | POA: Diagnosis not present

## 2019-01-19 ENCOUNTER — Encounter: Payer: Self-pay | Admitting: Emergency Medicine

## 2019-01-19 ENCOUNTER — Emergency Department
Admission: EM | Admit: 2019-01-19 | Discharge: 2019-01-19 | Disposition: A | Discharge status: Home / Self Care | Payer: PPO | Attending: Emergency Medicine | Admitting: Emergency Medicine

## 2019-01-19 ENCOUNTER — Other Ambulatory Visit: Payer: Self-pay

## 2019-01-19 DIAGNOSIS — E860 Dehydration: Secondary | ICD-10-CM | POA: Insufficient documentation

## 2019-01-19 DIAGNOSIS — Z955 Presence of coronary angioplasty implant and graft: Secondary | ICD-10-CM | POA: Diagnosis not present

## 2019-01-19 DIAGNOSIS — I5022 Chronic systolic (congestive) heart failure: Secondary | ICD-10-CM | POA: Diagnosis not present

## 2019-01-19 DIAGNOSIS — Z951 Presence of aortocoronary bypass graft: Secondary | ICD-10-CM | POA: Diagnosis not present

## 2019-01-19 DIAGNOSIS — Z79899 Other long term (current) drug therapy: Secondary | ICD-10-CM | POA: Diagnosis not present

## 2019-01-19 DIAGNOSIS — I130 Hypertensive heart and chronic kidney disease with heart failure and stage 1 through stage 4 chronic kidney disease, or unspecified chronic kidney disease: Secondary | ICD-10-CM | POA: Insufficient documentation

## 2019-01-19 DIAGNOSIS — I2510 Atherosclerotic heart disease of native coronary artery without angina pectoris: Secondary | ICD-10-CM | POA: Diagnosis not present

## 2019-01-19 DIAGNOSIS — R42 Dizziness and giddiness: Secondary | ICD-10-CM | POA: Diagnosis not present

## 2019-01-19 DIAGNOSIS — I252 Old myocardial infarction: Secondary | ICD-10-CM | POA: Diagnosis not present

## 2019-01-19 DIAGNOSIS — F17220 Nicotine dependence, chewing tobacco, uncomplicated: Secondary | ICD-10-CM | POA: Insufficient documentation

## 2019-01-19 DIAGNOSIS — N183 Chronic kidney disease, stage 3 (moderate): Secondary | ICD-10-CM | POA: Diagnosis not present

## 2019-01-19 LAB — URINALYSIS, COMPLETE (UACMP) WITH MICROSCOPIC
Bacteria, UA: NONE SEEN
Bilirubin Urine: NEGATIVE
Glucose, UA: NEGATIVE mg/dL
Hgb urine dipstick: NEGATIVE
Ketones, ur: NEGATIVE mg/dL
Leukocytes,Ua: NEGATIVE
Nitrite: NEGATIVE
Protein, ur: NEGATIVE mg/dL
Specific Gravity, Urine: 1.018 (ref 1.005–1.030)
pH: 5 (ref 5.0–8.0)

## 2019-01-19 LAB — BASIC METABOLIC PANEL
Anion gap: 11 (ref 5–15)
BUN: 23 mg/dL (ref 8–23)
CO2: 21 mmol/L — ABNORMAL LOW (ref 22–32)
Calcium: 9.3 mg/dL (ref 8.9–10.3)
Chloride: 101 mmol/L (ref 98–111)
Creatinine, Ser: 1.49 mg/dL — ABNORMAL HIGH (ref 0.61–1.24)
GFR calc Af Amer: 49 mL/min — ABNORMAL LOW (ref >60)
GFR calc non Af Amer: 42 mL/min — ABNORMAL LOW (ref >60)
Glucose, Bld: 103 mg/dL — ABNORMAL HIGH (ref 70–99)
Potassium: 4.6 mmol/L (ref 3.5–5.1)
Sodium: 133 mmol/L — ABNORMAL LOW (ref 135–145)

## 2019-01-19 LAB — TROPONIN I (HIGH SENSITIVITY): Troponin I (High Sensitivity): 15 ng/L (ref <18)

## 2019-01-19 LAB — CBC
HCT: 31.8 % — ABNORMAL LOW (ref 39.0–52.0)
Hemoglobin: 10.3 g/dL — ABNORMAL LOW (ref 13.0–17.0)
MCH: 33.6 pg (ref 26.0–34.0)
MCHC: 32.4 g/dL (ref 30.0–36.0)
MCV: 103.6 fL — ABNORMAL HIGH (ref 80.0–100.0)
Platelets: 92 10*3/uL — ABNORMAL LOW (ref 150–400)
RBC: 3.07 MIL/uL — ABNORMAL LOW (ref 4.22–5.81)
RDW: 16.9 % — ABNORMAL HIGH (ref 11.5–15.5)
WBC: 6.4 10*3/uL (ref 4.0–10.5)
nRBC: 0 % (ref 0.0–0.2)

## 2019-01-19 LAB — TSH: TSH: 3.179 u[IU]/mL (ref 0.350–4.500)

## 2019-01-19 MED ORDER — SODIUM CHLORIDE 0.9 % IV BOLUS
1000.0000 mL | Freq: Once | INTRAVENOUS | Status: AC
  Administered 2019-01-19: 1000 mL via INTRAVENOUS

## 2019-01-19 NOTE — ED Notes (Signed)
Patient ambulated 57ft using walker.

## 2019-01-19 NOTE — ED Provider Notes (Signed)
[facility] Emergency Department Provider Note  ____________________________________________   I have reviewed the triage vital signs and the nursing notes.   HISTORY  Chief Complaint Weakness   History limited by: Not Limited   HPI Andres Gutierrez is a 85 y.o. male who presents to the emergency department today because of concern for an episode of dizziness. The patient states that it occurred this evening.  He was sitting down when it occurred.  States that all of a sudden he felt dizzy and off-balance.  Family states that he was also short of breath although the patient does not recall being short of breath.  Patient says that he had a similar episode a couple weeks ago and was admitted.  Family states that he felt he did not sound quite right on the telephone when they called earlier in the morning.  Patient denies any fevers.  Denies any chest pain or palpitations with this episode.   Records reviewed. Per medical record review patient has a history of recent admission for orthostatic hypotension, sinus bradycardia.   Past Medical History:  Diagnosis Date  . Anemia   . Anxiety   . Atrial fibrillation ([place])   . Cardiomyopathy ([place])   . CHF (congestive heart failure) ([place])   . Chronic kidney disease    kidney stones  . Coronary artery disease   . Cough   . Dysrhythmia   . Hypertension   . Lymphadenopathy, hilar   . MI (myocardial infarction) ([place])    x 2  . Wheezing     Patient Active Problem List   Diagnosis Date Noted  . Orthostatic hypotension 01/07/2019  . Macrocytic anemia 08/05/2018  . Anemia of chronic kidney failure, stage 3 (moderate) ([place]) 08/05/2018  . Chronic systolic heart failure ([place]) 01/17/2016  . Chewing tobacco use 01/17/2016  . Atrial fibrillation with RVR ([place]) 12/23/2015  . Dyspnea on exertion 12/23/2015  . Elevated troponin 12/23/2015  . GERD (gastroesophageal reflux disease) 12/23/2015  . Iron deficiency anemia due to  chronic blood loss 12/20/2015  . Femoral neck fracture, left, closed, initial encounter 11/03/2015  . Chronic atrial fibrillation   . Coronary artery disease involving native coronary artery of native heart without angina pectoris   . Acalculous cholecystitis 10/29/2015  . Adenopathy   . CAD (coronary artery disease) 12/22/2014  . HTN (hypertension) 12/22/2014    Past Surgical History:  Procedure Laterality Date  . BRONCHIAL NEEDLE ASPIRATION BIOPSY N/A 12/31/2014   Procedure: BRONCHIAL NEEDLE ASPIRATION BIOPSIES from carina;  Surgeon: Flora Lipps, MD;  Location: [facility] ORS;  Service: Cardiopulmonary;  Laterality: N/A;  . CARDIAC CATHETERIZATION N/A 12/28/2015   Procedure: Left Heart Cath and Cors/Grafts Angiography;  Surgeon: Yolonda Kida, MD;  Location: [facility];  Service: Cardiovascular;  Laterality: N/A;  . CATARACT EXTRACTION W/PHACO Left 08/01/2016   Procedure: CATARACT EXTRACTION PHACO AND INTRAOCULAR LENS PLACEMENT ([place]) Left;  Surgeon: Leandrew Koyanagi, MD;  Location: [facility];  Service: Ophthalmology;  Laterality: Left;  . CORONARY ANGIOPLASTY WITH STENT PLACEMENT    . CORONARY ARTERY BYPASS GRAFT    . ENDOBRONCHIAL ULTRASOUND N/A 12/31/2014   Procedure: ENDOBRONCHIAL ULTRASOUND;  Surgeon: Flora Lipps, MD;  Location: [facility] ORS;  Service: Cardiopulmonary;  Laterality: N/A;  . ESOPHAGOGASTRODUODENOSCOPY (EGD) WITH PROPOFOL N/A 06/20/2015   Procedure: ESOPHAGOGASTRODUODENOSCOPY (EGD) WITH PROPOFOL;  Surgeon: Lollie Sails, MD;  Location: [facility] ENDOSCOPY;  Service: Endoscopy;  Laterality: N/A;    Prior to Admission medications   Medication  Sig Start Date End Date Taking? Authorizing Provider  acetaminophen (TYLENOL) 500 MG tablet Take 500 mg by mouth every 6 (six) hours as needed for mild pain or moderate pain.    [provider]  atorvastatin (LIPITOR) 40 MG tablet Take 1 tablet (40 mg total) by mouth daily at 6 PM. 12/30/15   Dustin Flock, MD   digoxin (LANOXIN) 0.125 MG tablet Take 0.0625 mg by mouth daily.    [provider]  docusate sodium (COLACE) 100 MG capsule Take 100 mg by mouth daily as needed.     [provider]  famotidine (PEPCID) 20 MG tablet Take 20 mg by mouth 2 (two) times daily. 09/11/18   [provider]  ferrous sulfate 325 (65 FE) MG tablet Take 1 tablet (325 mg total) by mouth daily. Patient taking differently: Take 325 mg by mouth daily with breakfast.  11/01/15   Dustin Flock, MD  furosemide (LASIX) 20 MG tablet Take 40 mg by mouth as needed. For weight gain greater than 2 pounds in 24 hours or greater than 5 pounds in one week    [provider]  metoprolol tartrate (LOPRESSOR) 25 MG tablet Take 0.5 tablets (12.5 mg total) by mouth 2 (two) times daily. 01/09/19   Nicholes Mango, MD  nitroGLYCERIN (NITROSTAT) 0.4 MG SL tablet Place 0.4 mg under the tongue every 5 (five) minutes as needed for chest pain.    [provider]  pantoprazole (PROTONIX) 40 MG tablet Take 40 mg by mouth 2 (two) times daily before a meal.     [provider]  polyethylene glycol (MIRALAX / GLYCOLAX) 17 g packet Take 17 g by mouth daily as needed for mild constipation. 01/09/19   Nicholes Mango, MD  ranolazine (RANEXA) 500 MG 12 hr tablet Take 1 tablet (500 mg total) by mouth 2 (two) times daily. 12/30/15   Dustin Flock, MD  sucralfate (CARAFATE) 1 G tablet Take 1 g by mouth 4 (four) times daily.     [provider]  tamsulosin (FLOMAX) 0.4 MG CAPS capsule Take 0.4 mg by mouth daily after supper.     [provider]  vitamin C (VITAMIN C) 250 MG tablet Take 1 tablet (250 mg total) by mouth daily. 11/01/15   Dustin Flock, MD    Allergies 5ht3 receptor antagonists, Diphenhydramine hcl, Escitalopram, Maxidex [dexamethasone], Prednisone, Serotonin, Vytorin [ezetimibe-simvastatin], Zocor [simvastatin], and Diltiazem  Family History  Problem Relation Age of Onset  . CAD  Mother   . Colon cancer Father     Social History Social History   Tobacco Use  . Smoking status: Former Smoker    Quit date: 06/27/1977    Years since quitting: 41.5  . Smokeless tobacco: Current User    Types: Chew  Substance Use Topics  . Alcohol use: Yes    Comment: occational almost rare once a year  . Drug use: No    Review of Systems Constitutional: No fever/chills Eyes: No visual changes. ENT: No sore throat. Cardiovascular: Denies chest pain. Respiratory: Denies shortness of breath. Gastrointestinal: No abdominal pain.  No nausea, no vomiting.  No diarrhea.   Genitourinary: Negative for dysuria. Musculoskeletal: Negative for back pain. Skin: Negative for rash. Neurological: Positive for dizziness.  ____________________________________________   PHYSICAL EXAM:  VITAL SIGNS: ED Triage Vitals  Enc Vitals Group     BP 01/19/19 1733 138/64     Pulse Rate 01/19/19 1733 62     Resp 01/19/19 1733 20  Temp 01/19/19 1733 98.6 F (37 C)     Temp Source 01/19/19 1733 Oral     SpO2 01/19/19 1733 96 %     Weight 01/19/19 1735 195 lb (88.5 kg)     Height 01/19/19 1735 6\' 3"  (1.905 m)     Head Circumference --      Peak Flow --      Pain Score 01/19/19 1735 2   Constitutional: Alert and oriented.  Eyes: Conjunctivae are normal.  ENT      Head: Normocephalic and atraumatic.      Nose: No congestion/rhinnorhea.      Mouth/Throat: Mucous membranes are moist.      Neck: No stridor. Hematological/Lymphatic/Immunilogical: No cervical lymphadenopathy. Cardiovascular: Normal rate, regular rhythm.  No murmurs, rubs, or gallops.  Respiratory: Normal respiratory effort without tachypnea nor retractions. Breath sounds are clear and equal bilaterally. No wheezes/rales/rhonchi. Gastrointestinal: Soft and non tender. No rebound. No guarding.  Genitourinary: Deferred Musculoskeletal: Normal range of motion in all extremities. No lower extremity edema. Neurologic:  Normal  speech and language. No gross focal neurologic deficits are appreciated.  Skin:  Skin is warm, dry and intact. No rash noted. Psychiatric: Mood and affect are normal. Speech and behavior are normal. Patient exhibits appropriate insight and judgment.  ____________________________________________    LABS (pertinent positives/negatives)  CBC wbc 6.4, hgb 10.3, plt 92 BMP na 133, glu 103, cr 1.49 UA hazy, rbc and wbc 0-5 TSH 3.179 Trop hs 15 ____________________________________________   EKG  I, Nance Pear, attending physician, personally viewed and interpreted this EKG  EKG Time: 1737 Rate: 63 Rhythm: sinus rhythm with 1st degree av block Axis: right axis deviation Intervals: qtc 470 QRS: non specific ivcd ST changes: no st elevation Impression: abnormal ekg ____________________________________________    RADIOLOGY  None  ____________________________________________   PROCEDURES  Procedures  ____________________________________________   INITIAL IMPRESSION / ASSESSMENT AND PLAN / ED COURSE  Pertinent labs & imaging results that were available during my care of the patient were reviewed by me and considered in my medical decision making (see chart for details).   Patient presented to the emergency department today after an episode of dizziness and weakness.  Patient states that he had a similar episode roughly 2 weeks ago.  On exam patient was admitted at that time.  Had negative head CT.  Was thought to be orthostatic.  Patient's blood work today without concerning leukocytosis or elevated troponin.  I did add on a TSH which was normal.  He did feel better after IV fluids and was able to ambulate without any further episodes of dizziness.  Discussion with patient family.  At this point I think is reasonable for patient to be discharged.  Discussed return precautions.  Discussed importance of primary care  follow-up.   ____________________________________________   FINAL CLINICAL IMPRESSION(S) / ED DIAGNOSES  Final diagnoses:  Dehydration  Dizziness     Note: This dictation was prepared with Dragon dictation. Any transcriptional errors that result from this process are unintentional     Nance Pear, MD 01/19/19 2321

## 2019-01-19 NOTE — Discharge Instructions (Addendum)
Please seek medical attention for any high fevers, chest pain, shortness of breath, change in behavior, persistent vomiting, bloody stool or any other new or concerning symptoms.  

## 2019-01-19 NOTE — ED Triage Notes (Signed)
FIRST NURSE NOTE-here for dizziness and off balance. Started possibly at 330 pm today.  Trying to verify timeline, pt lives alone, here with SIL.  Pulled for triage to verify sx and onset time.

## 2019-01-19 NOTE — ED Triage Notes (Signed)
Patient presents to the ED with weakness and dizziness that began this morning per patient's daughter.  Patient is also complaining of feeling short of breath and some stomach pain.  Patient was recently hospitalized for dehydration and states he feels similar to how he felt at that time.

## 2019-01-26 DIAGNOSIS — L97512 Non-pressure chronic ulcer of other part of right foot with fat layer exposed: Secondary | ICD-10-CM | POA: Diagnosis not present

## 2019-01-26 DIAGNOSIS — M170 Bilateral primary osteoarthritis of knee: Secondary | ICD-10-CM | POA: Diagnosis not present

## 2019-02-02 ENCOUNTER — Ambulatory Visit: Payer: PPO | Admitting: Internal Medicine

## 2019-02-02 ENCOUNTER — Other Ambulatory Visit: Payer: PPO

## 2019-02-02 ENCOUNTER — Ambulatory Visit: Payer: PPO

## 2019-02-03 ENCOUNTER — Other Ambulatory Visit: Payer: Self-pay

## 2019-02-03 ENCOUNTER — Encounter: Payer: Self-pay | Admitting: Internal Medicine

## 2019-02-04 ENCOUNTER — Inpatient Hospital Stay: Payer: PPO

## 2019-02-04 ENCOUNTER — Other Ambulatory Visit: Payer: Self-pay

## 2019-02-04 ENCOUNTER — Inpatient Hospital Stay (HOSPITAL_BASED_OUTPATIENT_CLINIC_OR_DEPARTMENT_OTHER): Payer: PPO | Admitting: Internal Medicine

## 2019-02-04 ENCOUNTER — Inpatient Hospital Stay: Payer: PPO | Attending: Internal Medicine

## 2019-02-04 DIAGNOSIS — I129 Hypertensive chronic kidney disease with stage 1 through stage 4 chronic kidney disease, or unspecified chronic kidney disease: Secondary | ICD-10-CM | POA: Insufficient documentation

## 2019-02-04 DIAGNOSIS — N183 Chronic kidney disease, stage 3 unspecified: Secondary | ICD-10-CM

## 2019-02-04 DIAGNOSIS — Z79899 Other long term (current) drug therapy: Secondary | ICD-10-CM | POA: Insufficient documentation

## 2019-02-04 DIAGNOSIS — D631 Anemia in chronic kidney disease: Secondary | ICD-10-CM

## 2019-02-04 DIAGNOSIS — Z87891 Personal history of nicotine dependence: Secondary | ICD-10-CM | POA: Diagnosis not present

## 2019-02-04 DIAGNOSIS — I509 Heart failure, unspecified: Secondary | ICD-10-CM | POA: Insufficient documentation

## 2019-02-04 DIAGNOSIS — F419 Anxiety disorder, unspecified: Secondary | ICD-10-CM | POA: Insufficient documentation

## 2019-02-04 DIAGNOSIS — I4891 Unspecified atrial fibrillation: Secondary | ICD-10-CM | POA: Insufficient documentation

## 2019-02-04 DIAGNOSIS — D509 Iron deficiency anemia, unspecified: Secondary | ICD-10-CM | POA: Insufficient documentation

## 2019-02-04 DIAGNOSIS — I2510 Atherosclerotic heart disease of native coronary artery without angina pectoris: Secondary | ICD-10-CM | POA: Diagnosis not present

## 2019-02-04 DIAGNOSIS — I252 Old myocardial infarction: Secondary | ICD-10-CM | POA: Diagnosis not present

## 2019-02-04 DIAGNOSIS — D696 Thrombocytopenia, unspecified: Secondary | ICD-10-CM | POA: Diagnosis not present

## 2019-02-04 DIAGNOSIS — Z87442 Personal history of urinary calculi: Secondary | ICD-10-CM | POA: Insufficient documentation

## 2019-02-04 DIAGNOSIS — R911 Solitary pulmonary nodule: Secondary | ICD-10-CM | POA: Insufficient documentation

## 2019-02-04 DIAGNOSIS — D500 Iron deficiency anemia secondary to blood loss (chronic): Secondary | ICD-10-CM

## 2019-02-04 LAB — CBC WITH DIFFERENTIAL/PLATELET
Abs Immature Granulocytes: 0.3 10*3/uL — ABNORMAL HIGH (ref 0.00–0.07)
Band Neutrophils: 1 %
Basophils Absolute: 0 10*3/uL (ref 0.0–0.1)
Basophils Relative: 0 %
Eosinophils Absolute: 0.1 10*3/uL (ref 0.0–0.5)
Eosinophils Relative: 1 %
HCT: 29.7 % — ABNORMAL LOW (ref 39.0–52.0)
Hemoglobin: 9.6 g/dL — ABNORMAL LOW (ref 13.0–17.0)
Lymphocytes Relative: 12 %
Lymphs Abs: 1.6 10*3/uL (ref 0.7–4.0)
MCH: 33.4 pg (ref 26.0–34.0)
MCHC: 32.3 g/dL (ref 30.0–36.0)
MCV: 103.5 fL — ABNORMAL HIGH (ref 80.0–100.0)
Metamyelocytes Relative: 1 %
Monocytes Absolute: 3.9 10*3/uL — ABNORMAL HIGH (ref 0.1–1.0)
Monocytes Relative: 29 %
Myelocytes: 1 %
Neutro Abs: 7.6 10*3/uL (ref 1.7–7.7)
Neutrophils Relative %: 55 %
Platelets: 125 10*3/uL — ABNORMAL LOW (ref 150–400)
RBC: 2.87 MIL/uL — ABNORMAL LOW (ref 4.22–5.81)
RDW: 16.5 % — ABNORMAL HIGH (ref 11.5–15.5)
Smear Review: NORMAL
WBC: 13.6 10*3/uL — ABNORMAL HIGH (ref 4.0–10.5)
nRBC: 0 % (ref 0.0–0.2)

## 2019-02-04 LAB — BASIC METABOLIC PANEL
Anion gap: 9 (ref 5–15)
BUN: 27 mg/dL — ABNORMAL HIGH (ref 8–23)
CO2: 24 mmol/L (ref 22–32)
Calcium: 9.4 mg/dL (ref 8.9–10.3)
Chloride: 106 mmol/L (ref 98–111)
Creatinine, Ser: 1.47 mg/dL — ABNORMAL HIGH (ref 0.61–1.24)
GFR calc Af Amer: 50 mL/min — ABNORMAL LOW (ref >60)
GFR calc non Af Amer: 43 mL/min — ABNORMAL LOW (ref >60)
Glucose, Bld: 104 mg/dL — ABNORMAL HIGH (ref 70–99)
Potassium: 4.4 mmol/L (ref 3.5–5.1)
Sodium: 139 mmol/L (ref 135–145)

## 2019-02-04 MED ORDER — DARBEPOETIN ALFA 300 MCG/0.6ML IJ SOSY
300.0000 ug | PREFILLED_SYRINGE | Freq: Once | INTRAMUSCULAR | Status: AC
  Administered 2019-02-04: 15:00:00 300 ug via SUBCUTANEOUS
  Filled 2019-02-04: qty 0.6

## 2019-02-04 NOTE — Progress Notes (Signed)
Proceed with aranesp today per v/o Dr. Rogue Bussing with systolic bp of 300

## 2019-02-04 NOTE — Assessment & Plan Note (Addendum)
#   Anemia mild/since 2012.  Suspect MDS/chronic kidney disease [NO BMBx]. STABLE.   #Hemoglobin  9.6; proceed with aranesp today; and continue every 2 to 3 weeks.  Continue IV iron as needed.  # Mild intermittent thrombocytopenia-? ITP~120s; STABLE.    # CKD Stage III- creat 1.5. STABLE.   # Left upper lobe cystic lesion noted-postinflammatory versus low-grade malignancy- repeat in dec 2020.   I spoke at length with the patient's family, Derryck- regarding the patient's clinical status/plan of care.  Family agreement.     #Disposition: # Aranesp today # in 2 weeks- cbc/iron studies/ferritin- aranesp # in 4 weeks MD- cbc/bmp/LDH-possible aranesp-Dr.B

## 2019-02-04 NOTE — Progress Notes (Signed)
[name] NOTE  Patient Care Team: Rusty Aus, MD as PCP - General (Internal Medicine) Alisa Graff, FNP as Nurse Practitioner (Family Medicine) Ubaldo Glassing Javier Docker, MD as Consulting Physician (Cardiology) Erby Pian, MD as Referring Physician (Specialist)  CHIEF COMPLAINTS/PURPOSE OF CONSULTATION:   # 2012- Anemia 11-12- sec to CKD/IDA? MDS [no BMBx] [March 2017-sat-11%; ferritin-42] [Feb 2017- EGD/colo-July 2015 Dr.Skulskie];April 2017-PO Iron PO; May 2020-IV iron/Aranesp.  # Intermittent thrombocytopenia 120s-140s [Dec 2016-CT- neg cirrhosis/splenomegaly]  #June 2020 -LUL cystic lesion- 4x4cm ?  Question etiology [slightly increased from 2017]; question postinflammatory versus low-grade adenocarcinoma-repeat scan in 6 months [December 2020]  # CKD [creatinine 1.2- 1.4]; CAD/CHF  HISTORY OF PRESENTING ILLNESS:  Andres Gutierrez 85 y.o.  male history of above history of chronic anemia and intermittent thrombocytopenia is here for follow-up/ currently on IV iron and also Aranesp.  Patient was recently evaluated in the emergency room-for generalized weakness.  Work-up was negative.  Hemoglobin over 10.  Denies any blood in stools or black or stools.  Mild to moderate fatigue.  Review of Systems  Constitutional: Positive for malaise/fatigue. Negative for chills, diaphoresis, fever and weight loss.  HENT: Negative for nosebleeds and sore throat.   Eyes: Negative for double vision.  Respiratory: Negative for cough, hemoptysis, sputum production, shortness of breath and wheezing.   Cardiovascular: Negative for chest pain, palpitations, orthopnea and leg swelling.  Gastrointestinal: Negative for abdominal pain, blood in stool, constipation, diarrhea, heartburn, melena, nausea and vomiting.  Genitourinary: Negative for dysuria, frequency and urgency.  Musculoskeletal: Positive for back pain and joint pain.  Skin: Negative.  Negative for itching and rash.   Neurological: Negative for dizziness, tingling, focal weakness, weakness and headaches.  Endo/Heme/Allergies: Does not bruise/bleed easily.  Psychiatric/Behavioral: Negative for depression. The patient is not nervous/anxious and does not have insomnia.      MEDICAL HISTORY:  Past Medical History:  Diagnosis Date  . Anemia   . Anxiety   . Atrial fibrillation ([place])   . Cardiomyopathy ([place])   . CHF (congestive heart failure) ([place])   . Chronic kidney disease    kidney stones  . Coronary artery disease   . Cough   . Dysrhythmia   . Hypertension   . Lymphadenopathy, hilar   . MI (myocardial infarction) ([place])    x 2  . Wheezing     SURGICAL HISTORY: Past Surgical History:  Procedure Laterality Date  . BRONCHIAL NEEDLE ASPIRATION BIOPSY N/A 12/31/2014   Procedure: BRONCHIAL NEEDLE ASPIRATION BIOPSIES from carina;  Surgeon: Flora Lipps, MD;  Location: [facility] ORS;  Service: Cardiopulmonary;  Laterality: N/A;  . CARDIAC CATHETERIZATION N/A 12/28/2015   Procedure: Left Heart Cath and Cors/Grafts Angiography;  Surgeon: Yolonda Kida, MD;  Location: [facility];  Service: Cardiovascular;  Laterality: N/A;  . CATARACT EXTRACTION W/PHACO Left 08/01/2016   Procedure: CATARACT EXTRACTION PHACO AND INTRAOCULAR LENS PLACEMENT ([place]) Left;  Surgeon: Leandrew Koyanagi, MD;  Location: [facility];  Service: Ophthalmology;  Laterality: Left;  . CORONARY ANGIOPLASTY WITH STENT PLACEMENT    . CORONARY ARTERY BYPASS GRAFT    . ENDOBRONCHIAL ULTRASOUND N/A 12/31/2014   Procedure: ENDOBRONCHIAL ULTRASOUND;  Surgeon: Flora Lipps, MD;  Location: [facility] ORS;  Service: Cardiopulmonary;  Laterality: N/A;  . ESOPHAGOGASTRODUODENOSCOPY (EGD) WITH PROPOFOL N/A 06/20/2015   Procedure: ESOPHAGOGASTRODUODENOSCOPY (EGD) WITH PROPOFOL;  Surgeon: Lollie Sails, MD;  Location: [facility] ENDOSCOPY;  Service: Endoscopy;  Laterality: N/A;    SOCIAL HISTORY: lives in  [place]; alone. 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   Social History   Socioeconomic History  . Marital status: Widowed    Spouse name: Not on file  . Number of children: 2  . Years of education: college  . Highest education level: Some college, no degree  Occupational History  . Occupation: retired  Scientific laboratory technician  . Financial resource strain: Not hard at all  . Food insecurity    Worry: Never true    Inability: Never true  . Transportation needs    Medical: No    Non-medical: No  Tobacco Use  . Smoking status: Former Smoker    Quit date: 06/27/1977    Years since quitting: 41.6  . Smokeless tobacco: Current User    Types: Chew  Substance and Sexual Activity  . Alcohol use: Yes    Comment: occational almost rare once a year  . Drug use: No  . Sexual activity: Never    Birth control/protection: Abstinence  Lifestyle  . Physical activity    Days per week: 7 days    Minutes per session: 30 min  . Stress: Not at all  Relationships  . Social connections    Talks on phone: More than three times a week    Gets together: More than three times a week    Attends religious service: More than 4 times per year    Active member of club or organization: No    Attends meetings of clubs or organizations: Never    Relationship status: Widowed  . Intimate partner violence    Fear of current or ex partner: Patient refused    Emotionally abused: Patient refused    Physically abused: Patient refused    Forced sexual activity: Patient refused  Other Topics Concern  . Not on file  Social History Narrative  . Not on file    FAMILY HISTORY: Family History  Problem Relation Age of Onset  . CAD Mother   . Colon cancer Father     ALLERGIES:  is allergic to 5ht3 receptor antagonists; diphenhydramine hcl; escitalopram; maxidex [dexamethasone]; prednisone; serotonin; vytorin [ezetimibe-simvastatin]; zocor [simvastatin]; and diltiazem.  MEDICATIONS:  Current Outpatient Medications  Medication Sig Dispense Refill  . acetaminophen  (TYLENOL) 500 MG tablet Take 500 mg by mouth every 6 (six) hours as needed for mild pain or moderate pain.    Marland Kitchen atorvastatin (LIPITOR) 40 MG tablet Take 1 tablet (40 mg total) by mouth daily at 6 PM. 30 tablet 0  . digoxin (LANOXIN) 0.125 MG tablet Take 0.0625 mg by mouth daily.    Marland Kitchen docusate sodium (COLACE) 100 MG capsule Take 100 mg by mouth daily as needed.     . ferrous sulfate 325 (65 FE) MG tablet Take 1 tablet (325 mg total) by mouth daily. (Patient taking differently: Take 325 mg by mouth daily with breakfast. ) 30 tablet 3  . furosemide (LASIX) 20 MG tablet Take 40 mg by mouth as needed. For weight gain greater than 2 pounds in 24 hours or greater than 5 pounds in one week    . metoprolol tartrate (LOPRESSOR) 25 MG tablet Take 0.5 tablets (12.5 mg total) by mouth 2 (two) times daily. 30 tablet 0  . nitroGLYCERIN (NITROSTAT) 0.4 MG SL tablet Place 0.4 mg under the tongue every 5 (five) minutes as needed for chest pain.    . pantoprazole (PROTONIX) 40 MG tablet Take 40 mg by mouth 2 (two) times daily before a meal.     . polyethylene  glycol (MIRALAX / GLYCOLAX) 17 g packet Take 17 g by mouth daily as needed for mild constipation. 14 each 0  . ranolazine (RANEXA) 500 MG 12 hr tablet Take 1 tablet (500 mg total) by mouth 2 (two) times daily. 60 tablet 0  . sucralfate (CARAFATE) 1 G tablet Take 1 g by mouth 4 (four) times daily.     . tamsulosin (FLOMAX) 0.4 MG CAPS capsule Take 0.4 mg by mouth daily after supper.     . vitamin C (VITAMIN C) 250 MG tablet Take 1 tablet (250 mg total) by mouth daily. 30 tablet 0   No current facility-administered medications for this visit.      PHYSICAL EXAMINATION: ECOG PERFORMANCE STATUS: 0 - Asymptomatic  Vitals:   02/04/19 1418 02/04/19 1420  BP: (!) 170/67 (!) 171/70  Pulse: 62 62  Resp: (!) 22   Temp: (!) 97.1 F (36.2 C)    Filed Weights   02/04/19 1418  Weight: 198 lb (89.8 kg)    Physical Exam  Constitutional: He is oriented to person,  place, and time.  Elderly Caucasian male patient. He is alone.  Walking by with a walker.  HENT:  Head: Normocephalic and atraumatic.  Mouth/Throat: Oropharynx is clear and moist. No oropharyngeal exudate.  Eyes: Pupils are equal, round, and reactive to light.  Neck: Normal range of motion. Neck supple.  Cardiovascular: Normal rate and regular rhythm.  Pulmonary/Chest: Breath sounds normal. No respiratory distress. He has no wheezes.  Abdominal: Soft. Bowel sounds are normal. He exhibits no distension and no mass. There is no abdominal tenderness. There is no rebound and no guarding.  Musculoskeletal: Normal range of motion.        General: No tenderness or edema.  Neurological: He is alert and oriented to person, place, and time.  Skin: Skin is warm.  Psychiatric: Affect normal.   .   LABORATORY DATA:  I have reviewed the data as listed Lab Results  Component Value Date   WBC 13.6 (H) 02/04/2019   HGB 9.6 (L) 02/04/2019   HCT 29.7 (L) 02/04/2019   MCV 103.5 (H) 02/04/2019   PLT 125 (L) 02/04/2019   Recent Labs    01/07/19 0711  01/09/19 0404 01/19/19 1752 02/04/19 1344  NA 140   < > 141 133* 139  K 4.3   < > 4.1 4.6 4.4  CL 109   < > 113* 101 106  CO2 21*   < > 22 21* 24  GLUCOSE 99   < > 95 103* 104*  BUN 24*   < > 19 23 27*  CREATININE 1.51*   < > 1.38* 1.49* 1.47*  CALCIUM 8.8*   < > 8.1* 9.3 9.4  GFRNONAA 42*   < > 46* 42* 43*  GFRAA 48*   < > 54* 49* 50*  PROT 7.8  --   --   --   --   ALBUMIN 3.8  --   --   --   --   AST 21  --   --   --   --   ALT 15  --   --   --   --   ALKPHOS 59  --   --   --   --   BILITOT 0.9  --   --   --   --   BILIDIR 0.2  --   --   --   --   IBILI 0.7  --   --   --   --    < > =  values in this interval not displayed.   ASSESSMENT & PLAN:   Anemia of chronic kidney failure, stage 3 (moderate) (HCC) # Anemia mild/since 2012.  Suspect MDS/chronic kidney disease [NO BMBx]. STABLE.   #Hemoglobin  9.6; proceed with aranesp today;  and continue every 2 to 3 weeks.  Continue IV iron as needed.  # Mild intermittent thrombocytopenia-? ITP~120s; STABLE.    # CKD Stage III- creat 1.5. STABLE.   # Left upper lobe cystic lesion noted-postinflammatory versus low-grade malignancy- repeat in dec 2020.   I spoke at length with the patient's family, Logyn- regarding the patient's clinical status/plan of care.  Family agreement.     #Disposition: # Aranesp today # in 2 weeks- cbc/iron studies/ferritin- aranesp # in 4 weeks MD- cbc/bmp/LDH-possible aranesp-Dr.B       Cammie Sickle, MD 02/11/2019 7:11 AM

## 2019-02-04 NOTE — Progress Notes (Signed)
Aranesp ok to give with elevated SBP per Dr. Cathie Hoops, PharmD

## 2019-02-18 ENCOUNTER — Inpatient Hospital Stay: Payer: PPO

## 2019-02-18 ENCOUNTER — Other Ambulatory Visit: Payer: Self-pay

## 2019-02-18 ENCOUNTER — Inpatient Hospital Stay: Payer: PPO | Attending: Internal Medicine

## 2019-02-18 VITALS — BP 134/67 | HR 80

## 2019-02-18 DIAGNOSIS — I429 Cardiomyopathy, unspecified: Secondary | ICD-10-CM | POA: Insufficient documentation

## 2019-02-18 DIAGNOSIS — I252 Old myocardial infarction: Secondary | ICD-10-CM | POA: Insufficient documentation

## 2019-02-18 DIAGNOSIS — I4891 Unspecified atrial fibrillation: Secondary | ICD-10-CM | POA: Insufficient documentation

## 2019-02-18 DIAGNOSIS — I2510 Atherosclerotic heart disease of native coronary artery without angina pectoris: Secondary | ICD-10-CM | POA: Insufficient documentation

## 2019-02-18 DIAGNOSIS — I130 Hypertensive heart and chronic kidney disease with heart failure and stage 1 through stage 4 chronic kidney disease, or unspecified chronic kidney disease: Secondary | ICD-10-CM | POA: Diagnosis not present

## 2019-02-18 DIAGNOSIS — D631 Anemia in chronic kidney disease: Secondary | ICD-10-CM | POA: Insufficient documentation

## 2019-02-18 DIAGNOSIS — D696 Thrombocytopenia, unspecified: Secondary | ICD-10-CM | POA: Insufficient documentation

## 2019-02-18 DIAGNOSIS — Z79899 Other long term (current) drug therapy: Secondary | ICD-10-CM | POA: Diagnosis not present

## 2019-02-18 DIAGNOSIS — R531 Weakness: Secondary | ICD-10-CM | POA: Diagnosis not present

## 2019-02-18 DIAGNOSIS — D509 Iron deficiency anemia, unspecified: Secondary | ICD-10-CM | POA: Diagnosis not present

## 2019-02-18 DIAGNOSIS — M549 Dorsalgia, unspecified: Secondary | ICD-10-CM | POA: Diagnosis not present

## 2019-02-18 DIAGNOSIS — R5383 Other fatigue: Secondary | ICD-10-CM | POA: Diagnosis not present

## 2019-02-18 DIAGNOSIS — I509 Heart failure, unspecified: Secondary | ICD-10-CM | POA: Diagnosis not present

## 2019-02-18 DIAGNOSIS — Z87891 Personal history of nicotine dependence: Secondary | ICD-10-CM | POA: Diagnosis not present

## 2019-02-18 DIAGNOSIS — Z23 Encounter for immunization: Secondary | ICD-10-CM | POA: Insufficient documentation

## 2019-02-18 DIAGNOSIS — N183 Chronic kidney disease, stage 3 unspecified: Secondary | ICD-10-CM | POA: Diagnosis not present

## 2019-02-18 DIAGNOSIS — D500 Iron deficiency anemia secondary to blood loss (chronic): Secondary | ICD-10-CM

## 2019-02-18 LAB — CBC WITH DIFFERENTIAL/PLATELET
Abs Immature Granulocytes: 0.1 10*3/uL — ABNORMAL HIGH (ref 0.00–0.07)
Basophils Absolute: 0 10*3/uL (ref 0.0–0.1)
Basophils Relative: 0 %
Eosinophils Absolute: 0 10*3/uL (ref 0.0–0.5)
Eosinophils Relative: 0 %
HCT: 30.3 % — ABNORMAL LOW (ref 39.0–52.0)
Hemoglobin: 9.9 g/dL — ABNORMAL LOW (ref 13.0–17.0)
Immature Granulocytes: 1 %
Lymphocytes Relative: 23 %
Lymphs Abs: 1.7 10*3/uL (ref 0.7–4.0)
MCH: 34.7 pg — ABNORMAL HIGH (ref 26.0–34.0)
MCHC: 32.7 g/dL (ref 30.0–36.0)
MCV: 106.3 fL — ABNORMAL HIGH (ref 80.0–100.0)
Monocytes Absolute: 2 10*3/uL — ABNORMAL HIGH (ref 0.1–1.0)
Monocytes Relative: 27 %
Neutro Abs: 3.5 10*3/uL (ref 1.7–7.7)
Neutrophils Relative %: 49 %
Platelets: 144 10*3/uL — ABNORMAL LOW (ref 150–400)
RBC: 2.85 MIL/uL — ABNORMAL LOW (ref 4.22–5.81)
RDW: 18 % — ABNORMAL HIGH (ref 11.5–15.5)
WBC: 7.3 10*3/uL (ref 4.0–10.5)
nRBC: 0 % (ref 0.0–0.2)

## 2019-02-18 LAB — IRON AND TIBC
Iron: 66 ug/dL (ref 45–182)
Saturation Ratios: 26 % (ref 17.9–39.5)
TIBC: 254 ug/dL (ref 250–450)
UIBC: 188 ug/dL

## 2019-02-18 LAB — FERRITIN: Ferritin: 360 ng/mL — ABNORMAL HIGH (ref 24–336)

## 2019-02-18 MED ORDER — DARBEPOETIN ALFA 300 MCG/0.6ML IJ SOSY
300.0000 ug | PREFILLED_SYRINGE | Freq: Once | INTRAMUSCULAR | Status: AC
  Administered 2019-02-18: 300 ug via SUBCUTANEOUS
  Filled 2019-02-18: qty 0.6

## 2019-02-23 DIAGNOSIS — L97512 Non-pressure chronic ulcer of other part of right foot with fat layer exposed: Secondary | ICD-10-CM | POA: Diagnosis not present

## 2019-02-23 DIAGNOSIS — G609 Hereditary and idiopathic neuropathy, unspecified: Secondary | ICD-10-CM | POA: Diagnosis not present

## 2019-02-23 DIAGNOSIS — L97521 Non-pressure chronic ulcer of other part of left foot limited to breakdown of skin: Secondary | ICD-10-CM | POA: Diagnosis not present

## 2019-03-03 ENCOUNTER — Encounter: Payer: Self-pay | Admitting: Oncology

## 2019-03-03 ENCOUNTER — Other Ambulatory Visit: Payer: Self-pay

## 2019-03-03 DIAGNOSIS — D126 Benign neoplasm of colon, unspecified: Secondary | ICD-10-CM | POA: Diagnosis not present

## 2019-03-03 DIAGNOSIS — N183 Chronic kidney disease, stage 3 unspecified: Secondary | ICD-10-CM | POA: Diagnosis not present

## 2019-03-03 DIAGNOSIS — D649 Anemia, unspecified: Secondary | ICD-10-CM | POA: Diagnosis not present

## 2019-03-03 DIAGNOSIS — D509 Iron deficiency anemia, unspecified: Secondary | ICD-10-CM | POA: Diagnosis not present

## 2019-03-03 DIAGNOSIS — Z800 Family history of malignant neoplasm of digestive organs: Secondary | ICD-10-CM | POA: Diagnosis not present

## 2019-03-03 DIAGNOSIS — R634 Abnormal weight loss: Secondary | ICD-10-CM | POA: Diagnosis not present

## 2019-03-03 DIAGNOSIS — I5022 Chronic systolic (congestive) heart failure: Secondary | ICD-10-CM | POA: Diagnosis not present

## 2019-03-03 DIAGNOSIS — I482 Chronic atrial fibrillation, unspecified: Secondary | ICD-10-CM | POA: Diagnosis not present

## 2019-03-03 NOTE — Progress Notes (Signed)
Patient stated that he had been doing well with no complaints. 

## 2019-03-04 ENCOUNTER — Other Ambulatory Visit: Payer: Self-pay

## 2019-03-04 ENCOUNTER — Inpatient Hospital Stay: Payer: PPO

## 2019-03-04 ENCOUNTER — Inpatient Hospital Stay (HOSPITAL_BASED_OUTPATIENT_CLINIC_OR_DEPARTMENT_OTHER): Payer: PPO | Admitting: Nurse Practitioner

## 2019-03-04 VITALS — BP 136/66 | HR 77 | Temp 98.0°F | Resp 20 | Wt 198.0 lb

## 2019-03-04 DIAGNOSIS — I130 Hypertensive heart and chronic kidney disease with heart failure and stage 1 through stage 4 chronic kidney disease, or unspecified chronic kidney disease: Secondary | ICD-10-CM | POA: Diagnosis not present

## 2019-03-04 DIAGNOSIS — N183 Chronic kidney disease, stage 3 unspecified: Secondary | ICD-10-CM | POA: Diagnosis not present

## 2019-03-04 DIAGNOSIS — D631 Anemia in chronic kidney disease: Secondary | ICD-10-CM | POA: Diagnosis not present

## 2019-03-04 LAB — CBC WITH DIFFERENTIAL/PLATELET
Abs Immature Granulocytes: 0.14 10*3/uL — ABNORMAL HIGH (ref 0.00–0.07)
Basophils Absolute: 0 10*3/uL (ref 0.0–0.1)
Basophils Relative: 0 %
Eosinophils Absolute: 0 10*3/uL (ref 0.0–0.5)
Eosinophils Relative: 0 %
HCT: 33.8 % — ABNORMAL LOW (ref 39.0–52.0)
Hemoglobin: 10.8 g/dL — ABNORMAL LOW (ref 13.0–17.0)
Immature Granulocytes: 1 %
Lymphocytes Relative: 15 %
Lymphs Abs: 1.7 10*3/uL (ref 0.7–4.0)
MCH: 34.1 pg — ABNORMAL HIGH (ref 26.0–34.0)
MCHC: 32 g/dL (ref 30.0–36.0)
MCV: 106.6 fL — ABNORMAL HIGH (ref 80.0–100.0)
Monocytes Absolute: 2.5 10*3/uL — ABNORMAL HIGH (ref 0.1–1.0)
Monocytes Relative: 22 %
Neutro Abs: 7 10*3/uL (ref 1.7–7.7)
Neutrophils Relative %: 62 %
Platelets: 139 10*3/uL — ABNORMAL LOW (ref 150–400)
RBC: 3.17 MIL/uL — ABNORMAL LOW (ref 4.22–5.81)
RDW: 17 % — ABNORMAL HIGH (ref 11.5–15.5)
WBC: 11.5 10*3/uL — ABNORMAL HIGH (ref 4.0–10.5)
nRBC: 0 % (ref 0.0–0.2)

## 2019-03-04 LAB — BASIC METABOLIC PANEL
Anion gap: 10 (ref 5–15)
BUN: 31 mg/dL — ABNORMAL HIGH (ref 8–23)
CO2: 22 mmol/L (ref 22–32)
Calcium: 8.8 mg/dL — ABNORMAL LOW (ref 8.9–10.3)
Chloride: 108 mmol/L (ref 98–111)
Creatinine, Ser: 1.42 mg/dL — ABNORMAL HIGH (ref 0.61–1.24)
GFR calc Af Amer: 52 mL/min — ABNORMAL LOW (ref >60)
GFR calc non Af Amer: 45 mL/min — ABNORMAL LOW (ref >60)
Glucose, Bld: 121 mg/dL — ABNORMAL HIGH (ref 70–99)
Potassium: 4 mmol/L (ref 3.5–5.1)
Sodium: 140 mmol/L (ref 135–145)

## 2019-03-04 LAB — LACTATE DEHYDROGENASE: LDH: 158 U/L (ref 98–192)

## 2019-03-04 MED ORDER — INFLUENZA VAC A&B SA ADJ QUAD 0.5 ML IM PRSY
0.5000 mL | PREFILLED_SYRINGE | Freq: Once | INTRAMUSCULAR | Status: AC
  Administered 2019-03-04: 0.5 mL via INTRAMUSCULAR
  Filled 2019-03-04: qty 0.5

## 2019-03-04 MED ORDER — FERROUS SULFATE 325 (65 FE) MG PO TABS: 325.0000 mg | ORAL_TABLET | ORAL | 3 refills | Status: DC

## 2019-03-04 NOTE — Assessment & Plan Note (Signed)
#  Anemia-mild.  Since 2012.  Suspect MDS/chronic kidney disease.  No bone marrow biopsy.  Stable.  #Hemoglobin 10.8. Received aranesp on 02/18/2019.  Hold Aranesp today.  Repeat labs in 2 weeks with possible Aranesp at that time.  #Iron deficiency anemia-ferritin was 360 on 02/18/2019.  On oral iron.  Decrease to every other day dosing.  No Feraheme today.  Awaiting possible endoscopy and colonoscopy.  Counts stable today.  #Mild intermittent thrombocytopenia-question ITP.  Platelets 139 today.  Stable to improved.  Continue to monitor.  #CKD stage III-creatinine stable at 1.42.  Continue to monitor.  #Left upper lobe cystic lesion-incidentally found on imaging with [facility].  Postinflammatory versus low-grade malignancy.  Plan to repeat chest CT without contrast in December 2020.  I spoke at length with the patient's daughter, [name], regarding the patient's clinical status and plan of care.  [name] and the patient are in agreement and will call clinic if any changes or concerns.  I spoke at length with the patient's family, Bevan- regarding the patient's clinical status/plan of care.  Family agreement.     #Disposition: # Aranesp today # in 2 weeks- cbc/iron studies/ferritin- aranesp # in 4 weeks MD- cbc/bmp/LDH-possible aranesp-Dr.B

## 2019-03-04 NOTE — Progress Notes (Signed)
[place] OFFICE PROGRESS NOTE  Patient Care Team: Rusty Aus, MD as PCP - General (Internal Medicine) Alisa Graff, FNP as Nurse Practitioner (Family Medicine) Ubaldo Glassing Javier Docker, MD as Consulting Physician (Cardiology) Erby Pian, MD as Referring Physician (Specialist)  CHIEF COMPLAINTS/PURPOSE OF CONSULTATION:   # 2012- Anemia 11-12- sec to CKD/IDA? MDS [no BMBx] [March 2017-sat-11%; ferritin-42] [Feb 2017- EGD/colo-July 2015 Dr.Skulskie];April 2017-PO Iron PO; May 2020-IV iron/Aranesp.  # Intermittent thrombocytopenia 120s-140s [Dec 2016-CT- neg cirrhosis/splenomegaly]  #June 2020 -LUL cystic lesion- 4x4cm ?  Question etiology [slightly increased from 2017]; question postinflammatory versus low-grade adenocarcinoma-repeat scan in 6 months [December 2020]  # CKD [creatinine 1.2- 1.4]; CAD/CHF  HISTORY OF PRESENTING ILLNESS:  Andres Gutierrez 85 y.o.  male patient with above history of chronic anemia and intermittent thrombocytopenia, currently on IV iron and Aranesp, who returns to clinic today for follow-up.  Patient reports stable mild to moderate fatigue.  Appetite is stable.  No black or bloody stools.  No nausea or vomiting.  Continues to feel at baseline.  Was seen yesterday by GI for possible endoscopy and colonoscopy.  No recent hospitalizations.  Denies blood in stools or black tarry stools.  Fatigue is stable and unchanged.  Denies any abnormal bleeding or bruising.  Denies any fever or chills.  He has chronic back and joint pain which are stable and unchanged.  We called his daughter, Andres Gutierrez, at his request to participated in the visit by phone.    Review of Systems  Constitutional: Positive for malaise/fatigue. Negative for chills, diaphoresis, fever and weight loss.  HENT: Negative for nosebleeds and sore throat.   Eyes: Negative for double vision.  Respiratory: Negative for cough, hemoptysis, sputum production, shortness of breath and wheezing.    Cardiovascular: Negative for chest pain, palpitations, orthopnea and leg swelling.  Gastrointestinal: Negative for abdominal pain, blood in stool, constipation, diarrhea, heartburn, melena, nausea and vomiting.  Genitourinary: Negative for dysuria, frequency and urgency.  Musculoskeletal: Positive for back pain and joint pain.  Skin: Negative.  Negative for itching and rash.  Neurological: Negative for dizziness, tingling, focal weakness, weakness and headaches.  Endo/Heme/Allergies: Does not bruise/bleed easily.  Psychiatric/Behavioral: Negative for depression. The patient is not nervous/anxious and does not have insomnia.     MEDICAL HISTORY:  Past Medical History:  Diagnosis Date  . Anemia   . Anxiety   . Atrial fibrillation ([place])   . Cardiomyopathy ([place])   . CHF (congestive heart failure) ([place])   . Chronic kidney disease    kidney stones  . Coronary artery disease   . Cough   . Dysrhythmia   . Hypertension   . Lymphadenopathy, hilar   . MI (myocardial infarction) ([place])    x 2  . Wheezing     SURGICAL HISTORY: Past Surgical History:  Procedure Laterality Date  . BRONCHIAL NEEDLE ASPIRATION BIOPSY N/A 12/31/2014   Procedure: BRONCHIAL NEEDLE ASPIRATION BIOPSIES from carina;  Surgeon: Flora Lipps, MD;  Location: [facility] ORS;  Service: Cardiopulmonary;  Laterality: N/A;  . CARDIAC CATHETERIZATION N/A 12/28/2015   Procedure: Left Heart Cath and Cors/Grafts Angiography;  Surgeon: Yolonda Kida, MD;  Location: [facility];  Service: Cardiovascular;  Laterality: N/A;  . CATARACT EXTRACTION W/PHACO Left 08/01/2016   Procedure: CATARACT EXTRACTION PHACO AND INTRAOCULAR LENS PLACEMENT ([place]) Left;  Surgeon: Leandrew Koyanagi, MD;  Location: [facility];  Service: Ophthalmology;  Laterality: Left;  . CORONARY ANGIOPLASTY WITH STENT PLACEMENT    .  CORONARY ARTERY BYPASS GRAFT    . ENDOBRONCHIAL ULTRASOUND N/A 12/31/2014   Procedure: ENDOBRONCHIAL ULTRASOUND;   Surgeon: Flora Lipps, MD;  Location: [facility];  Service: Cardiopulmonary;  Laterality: N/A;  . ESOPHAGOGASTRODUODENOSCOPY (EGD) WITH PROPOFOL N/A 06/20/2015   Procedure: ESOPHAGOGASTRODUODENOSCOPY (EGD) WITH PROPOFOL;  Surgeon: Lollie Sails, MD;  Location: [facility] ENDOSCOPY;  Service: Endoscopy;  Laterality: N/A;    SOCIAL HISTORY: lives in [place]; alone. 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  Social History   Socioeconomic History  . Marital status: Widowed    Spouse name: Not on file  . Number of children: 2  . Years of education: college  . Highest education level: Some college, no degree  Occupational History  . Occupation: retired  Scientific laboratory technician  . Financial resource strain: Not hard at all  . Food insecurity    Worry: Never true    Inability: Never true  . Transportation needs    Medical: No    Non-medical: No  Tobacco Use  . Smoking status: Former Smoker    Quit date: 06/27/1977    Years since quitting: 41.7  . Smokeless tobacco: Current User    Types: Chew  Substance and Sexual Activity  . Alcohol use: Yes    Comment: occational almost rare once a year  . Drug use: No  . Sexual activity: Never    Birth control/protection: Abstinence  Lifestyle  . Physical activity    Days per week: 7 days    Minutes per session: 30 min  . Stress: Not at all  Relationships  . Social connections    Talks on phone: More than three times a week    Gets together: More than three times a week    Attends religious service: More than 4 times per year    Active member of club or organization: No    Attends meetings of clubs or organizations: Never    Relationship status: Widowed  . Intimate partner violence    Fear of current or ex partner: Patient refused    Emotionally abused: Patient refused    Physically abused: Patient refused    Forced sexual activity: Patient refused  Other Topics Concern  . Not on file  Social History Narrative  . Not on file    FAMILY HISTORY: Family History   Problem Relation Age of Onset  . CAD Mother   . Colon cancer Father     ALLERGIES:  is allergic to 5ht3 receptor antagonists; diphenhydramine hcl; escitalopram; maxidex [dexamethasone]; prednisone; serotonin; vytorin [ezetimibe-simvastatin]; zocor [simvastatin]; and diltiazem.  MEDICATIONS:  Current Outpatient Medications  Medication Sig Dispense Refill  . acetaminophen (TYLENOL) 500 MG tablet Take 500 mg by mouth every 6 (six) hours as needed for mild pain or moderate pain.    Marland Kitchen atorvastatin (LIPITOR) 40 MG tablet Take 1 tablet (40 mg total) by mouth daily at 6 PM. 30 tablet 0  . digoxin (LANOXIN) 0.125 MG tablet Take 0.0625 mg by mouth daily.    Marland Kitchen docusate sodium (COLACE) 100 MG capsule Take 100 mg by mouth daily as needed.     . ferrous sulfate 325 (65 FE) MG tablet Take 1 tablet (325 mg total) by mouth daily. (Patient taking differently: Take 325 mg by mouth daily with breakfast. ) 30 tablet 3  . furosemide (LASIX) 20 MG tablet Take 40 mg by mouth as needed. For weight gain greater than 2 pounds in 24 hours or greater than 5 pounds in one week    . metoprolol  tartrate (LOPRESSOR) 25 MG tablet Take 0.5 tablets (12.5 mg total) by mouth 2 (two) times daily. 30 tablet 0  . nitroGLYCERIN (NITROSTAT) 0.4 MG SL tablet Place 0.4 mg under the tongue every 5 (five) minutes as needed for chest pain.    . pantoprazole (PROTONIX) 40 MG tablet Take 40 mg by mouth 2 (two) times daily before a meal.     . polyethylene glycol (MIRALAX / GLYCOLAX) 17 g packet Take 17 g by mouth daily as needed for mild constipation. 14 each 0  . ranolazine (RANEXA) 1000 MG SR tablet Take 1 tablet by mouth daily.    . sucralfate (CARAFATE) 1 G tablet Take 1 g by mouth 4 (four) times daily.     . tamsulosin (FLOMAX) 0.4 MG CAPS capsule Take 0.4 mg by mouth daily after supper.     . vitamin C (VITAMIN C) 250 MG tablet Take 1 tablet (250 mg total) by mouth daily. 30 tablet 0   No current facility-administered medications  for this visit.      PHYSICAL EXAMINATION: ECOG PERFORMANCE STATUS: 0 - Asymptomatic  Vitals:   03/04/19 1319  BP: 136/66  Pulse: 77  Resp: 20  Temp: 98 F (36.7 C)  SpO2: 98%   Filed Weights   03/04/19 1319  Weight: 198 lb (89.8 kg)    Physical Exam  Constitutional: He is oriented to person, place, and time.  Elderly Caucasian male patient. He is alone.  Walking by with a walker.  HENT:  Head: Normocephalic and atraumatic.  Mouth/Throat: Oropharynx is clear and moist. No oropharyngeal exudate.  Hard of hearing  Eyes: Pupils are equal, round, and reactive to light.  Neck: Normal range of motion. Neck supple.  Cardiovascular: Normal rate and regular rhythm.  Pulmonary/Chest: Breath sounds normal. No respiratory distress. He has no wheezes.  Abdominal: Soft. Bowel sounds are normal. He exhibits no distension and no mass. There is no abdominal tenderness. There is no rebound and no guarding.  Musculoskeletal: Normal range of motion.        General: No tenderness or edema.  Neurological: He is alert and oriented to person, place, and time.  Skin: Skin is warm.  Psychiatric: Mood and affect normal.   .   LABORATORY DATA:  I have reviewed the data as listed Lab Results  Component Value Date   WBC 11.5 (H) 03/04/2019   HGB 10.8 (L) 03/04/2019   HCT 33.8 (L) 03/04/2019   MCV 106.6 (H) 03/04/2019   PLT 139 (L) 03/04/2019   Recent Labs    01/07/19 0711  01/19/19 1752 02/04/19 1344 03/04/19 1254  NA 140   < > 133* 139 140  K 4.3   < > 4.6 4.4 4.0  CL 109   < > 101 106 108  CO2 21*   < > 21* 24 22  GLUCOSE 99   < > 103* 104* 121*  BUN 24*   < > 23 27* 31*  CREATININE 1.51*   < > 1.49* 1.47* 1.42*  CALCIUM 8.8*   < > 9.3 9.4 8.8*  GFRNONAA 42*   < > 42* 43* 45*  GFRAA 48*   < > 49* 50* 52*  PROT 7.8  --   --   --   --   ALBUMIN 3.8  --   --   --   --   AST 21  --   --   --   --   ALT 15  --   --   --   --  ALKPHOS 59  --   --   --   --   BILITOT 0.9  --    --   --   --   BILIDIR 0.2  --   --   --   --   IBILI 0.7  --   --   --   --    < > = values in this interval not displayed.   ASSESSMENT & PLAN:   Anemia of chronic kidney failure, stage 3 (moderate) ([place]) # Anemia-mild.  Since 2012.  Suspect MDS/chronic kidney disease.  No bone marrow biopsy.  Stable.  #Hemoglobin 10.8. Received aranesp on 02/18/2019.  Hold Aranesp today.  Repeat labs in 2 weeks with possible Aranesp at that time.  #Iron deficiency anemia-ferritin was 360 on 02/18/2019.  On oral iron.  Decrease to every other day dosing.  No Feraheme today.  Awaiting possible endoscopy and colonoscopy.  Counts stable today.  #Mild intermittent thrombocytopenia-question ITP.  Platelets 139 today.  Stable to improved.  Continue to monitor.  #CKD stage III-creatinine stable at 1.42.  Continue to monitor.  #Left upper lobe cystic lesion-incidentally found on imaging with [facility].  Postinflammatory versus low-grade malignancy.  Plan to repeat chest CT without contrast in December 2020.  I spoke at length with the patient's daughter, Andres Gutierrez, regarding the patient's clinical status and plan of care.  Andres Gutierrez and the patient are in agreement and will call clinic if any changes or concerns.  I spoke at length with the patient's family, Andres Gutierrez- regarding the patient's clinical status/plan of care.  Family agreement.     #Disposition: # Aranesp today # in 2 weeks- cbc/iron studies/ferritin- aranesp # in 4 weeks MD- cbc/bmp/LDH-possible aranesp-Dr.B  Ok for flu shot today.  Beckey Rutter, DNP, AGNP-[facility]: Dr. Rogue Bussing

## 2019-03-18 ENCOUNTER — Inpatient Hospital Stay: Payer: PPO

## 2019-03-18 ENCOUNTER — Inpatient Hospital Stay: Payer: PPO | Attending: Internal Medicine

## 2019-03-18 ENCOUNTER — Other Ambulatory Visit: Payer: Self-pay

## 2019-03-18 DIAGNOSIS — D509 Iron deficiency anemia, unspecified: Secondary | ICD-10-CM | POA: Diagnosis not present

## 2019-03-18 DIAGNOSIS — D696 Thrombocytopenia, unspecified: Secondary | ICD-10-CM | POA: Insufficient documentation

## 2019-03-18 DIAGNOSIS — D631 Anemia in chronic kidney disease: Secondary | ICD-10-CM

## 2019-03-18 DIAGNOSIS — I130 Hypertensive heart and chronic kidney disease with heart failure and stage 1 through stage 4 chronic kidney disease, or unspecified chronic kidney disease: Secondary | ICD-10-CM | POA: Diagnosis not present

## 2019-03-18 DIAGNOSIS — Z87891 Personal history of nicotine dependence: Secondary | ICD-10-CM | POA: Insufficient documentation

## 2019-03-18 DIAGNOSIS — R109 Unspecified abdominal pain: Secondary | ICD-10-CM | POA: Insufficient documentation

## 2019-03-18 DIAGNOSIS — N183 Chronic kidney disease, stage 3 unspecified: Secondary | ICD-10-CM | POA: Diagnosis not present

## 2019-03-18 DIAGNOSIS — I429 Cardiomyopathy, unspecified: Secondary | ICD-10-CM | POA: Diagnosis not present

## 2019-03-18 DIAGNOSIS — Z79899 Other long term (current) drug therapy: Secondary | ICD-10-CM | POA: Insufficient documentation

## 2019-03-18 DIAGNOSIS — R918 Other nonspecific abnormal finding of lung field: Secondary | ICD-10-CM | POA: Insufficient documentation

## 2019-03-18 DIAGNOSIS — I2510 Atherosclerotic heart disease of native coronary artery without angina pectoris: Secondary | ICD-10-CM | POA: Insufficient documentation

## 2019-03-18 DIAGNOSIS — I509 Heart failure, unspecified: Secondary | ICD-10-CM | POA: Insufficient documentation

## 2019-03-18 DIAGNOSIS — I252 Old myocardial infarction: Secondary | ICD-10-CM | POA: Diagnosis not present

## 2019-03-18 DIAGNOSIS — I4891 Unspecified atrial fibrillation: Secondary | ICD-10-CM | POA: Insufficient documentation

## 2019-03-18 LAB — CBC WITH DIFFERENTIAL/PLATELET
Abs Immature Granulocytes: 0.43 10*3/uL — ABNORMAL HIGH (ref 0.00–0.07)
Basophils Absolute: 0 10*3/uL (ref 0.0–0.1)
Basophils Relative: 0 %
Eosinophils Absolute: 0 10*3/uL (ref 0.0–0.5)
Eosinophils Relative: 0 %
HCT: 33.3 % — ABNORMAL LOW (ref 39.0–52.0)
Hemoglobin: 10.9 g/dL — ABNORMAL LOW (ref 13.0–17.0)
Immature Granulocytes: 4 %
Lymphocytes Relative: 19 %
Lymphs Abs: 2 10*3/uL (ref 0.7–4.0)
MCH: 33.7 pg (ref 26.0–34.0)
MCHC: 32.7 g/dL (ref 30.0–36.0)
MCV: 103.1 fL — ABNORMAL HIGH (ref 80.0–100.0)
Monocytes Absolute: 2.4 10*3/uL — ABNORMAL HIGH (ref 0.1–1.0)
Monocytes Relative: 24 %
Neutro Abs: 5.4 10*3/uL (ref 1.7–7.7)
Neutrophils Relative %: 53 %
Platelets: 130 10*3/uL — ABNORMAL LOW (ref 150–400)
RBC: 3.23 MIL/uL — ABNORMAL LOW (ref 4.22–5.81)
RDW: 16 % — ABNORMAL HIGH (ref 11.5–15.5)
WBC: 10.3 10*3/uL (ref 4.0–10.5)
nRBC: 0 % (ref 0.0–0.2)

## 2019-03-18 LAB — BASIC METABOLIC PANEL
Anion gap: 9 (ref 5–15)
BUN: 32 mg/dL — ABNORMAL HIGH (ref 8–23)
CO2: 23 mmol/L (ref 22–32)
Calcium: 9.3 mg/dL (ref 8.9–10.3)
Chloride: 106 mmol/L (ref 98–111)
Creatinine, Ser: 1.52 mg/dL — ABNORMAL HIGH (ref 0.61–1.24)
GFR calc Af Amer: 48 mL/min — ABNORMAL LOW (ref >60)
GFR calc non Af Amer: 41 mL/min — ABNORMAL LOW (ref >60)
Glucose, Bld: 112 mg/dL — ABNORMAL HIGH (ref 70–99)
Potassium: 4.3 mmol/L (ref 3.5–5.1)
Sodium: 138 mmol/L (ref 135–145)

## 2019-03-18 LAB — IRON AND TIBC
Iron: 128 ug/dL (ref 45–182)
Saturation Ratios: 46 % — ABNORMAL HIGH (ref 17.9–39.5)
TIBC: 281 ug/dL (ref 250–450)
UIBC: 153 ug/dL

## 2019-03-18 LAB — FERRITIN: Ferritin: 438 ng/mL — ABNORMAL HIGH (ref 24–336)

## 2019-03-23 ENCOUNTER — Other Ambulatory Visit: Payer: Self-pay | Admitting: Gastroenterology

## 2019-03-23 DIAGNOSIS — L97522 Non-pressure chronic ulcer of other part of left foot with fat layer exposed: Secondary | ICD-10-CM | POA: Diagnosis not present

## 2019-03-23 DIAGNOSIS — G609 Hereditary and idiopathic neuropathy, unspecified: Secondary | ICD-10-CM | POA: Diagnosis not present

## 2019-03-23 DIAGNOSIS — R911 Solitary pulmonary nodule: Secondary | ICD-10-CM

## 2019-03-23 DIAGNOSIS — L97511 Non-pressure chronic ulcer of other part of right foot limited to breakdown of skin: Secondary | ICD-10-CM | POA: Diagnosis not present

## 2019-03-26 ENCOUNTER — Other Ambulatory Visit: Payer: Self-pay

## 2019-03-26 ENCOUNTER — Emergency Department
Admission: EM | Admit: 2019-03-26 | Discharge: 2019-03-26 | Disposition: A | Discharge status: Home / Self Care | Payer: PPO | Attending: Emergency Medicine | Admitting: Emergency Medicine

## 2019-03-26 ENCOUNTER — Emergency Department: Payer: PPO

## 2019-03-26 ENCOUNTER — Encounter: Payer: Self-pay | Admitting: Emergency Medicine

## 2019-03-26 DIAGNOSIS — I5022 Chronic systolic (congestive) heart failure: Secondary | ICD-10-CM | POA: Insufficient documentation

## 2019-03-26 DIAGNOSIS — R1032 Left lower quadrant pain: Secondary | ICD-10-CM

## 2019-03-26 DIAGNOSIS — D696 Thrombocytopenia, unspecified: Secondary | ICD-10-CM

## 2019-03-26 DIAGNOSIS — N183 Chronic kidney disease, stage 3 unspecified: Secondary | ICD-10-CM | POA: Insufficient documentation

## 2019-03-26 DIAGNOSIS — R202 Paresthesia of skin: Secondary | ICD-10-CM

## 2019-03-26 DIAGNOSIS — N202 Calculus of kidney with calculus of ureter: Secondary | ICD-10-CM | POA: Insufficient documentation

## 2019-03-26 DIAGNOSIS — F17220 Nicotine dependence, chewing tobacco, uncomplicated: Secondary | ICD-10-CM | POA: Diagnosis not present

## 2019-03-26 DIAGNOSIS — D649 Anemia, unspecified: Secondary | ICD-10-CM | POA: Diagnosis not present

## 2019-03-26 DIAGNOSIS — Z79899 Other long term (current) drug therapy: Secondary | ICD-10-CM | POA: Diagnosis not present

## 2019-03-26 DIAGNOSIS — R1110 Vomiting, unspecified: Secondary | ICD-10-CM | POA: Diagnosis not present

## 2019-03-26 DIAGNOSIS — I252 Old myocardial infarction: Secondary | ICD-10-CM | POA: Insufficient documentation

## 2019-03-26 DIAGNOSIS — I2510 Atherosclerotic heart disease of native coronary artery without angina pectoris: Secondary | ICD-10-CM | POA: Insufficient documentation

## 2019-03-26 DIAGNOSIS — Z951 Presence of aortocoronary bypass graft: Secondary | ICD-10-CM | POA: Insufficient documentation

## 2019-03-26 DIAGNOSIS — N200 Calculus of kidney: Secondary | ICD-10-CM

## 2019-03-26 DIAGNOSIS — I130 Hypertensive heart and chronic kidney disease with heart failure and stage 1 through stage 4 chronic kidney disease, or unspecified chronic kidney disease: Secondary | ICD-10-CM | POA: Diagnosis not present

## 2019-03-26 LAB — COMPREHENSIVE METABOLIC PANEL
ALT: 20 U/L (ref 0–44)
AST: 20 U/L (ref 15–41)
Albumin: 4.3 g/dL (ref 3.5–5.0)
Alkaline Phosphatase: 59 U/L (ref 38–126)
Anion gap: 11 (ref 5–15)
BUN: 31 mg/dL — ABNORMAL HIGH (ref 8–23)
CO2: 22 mmol/L (ref 22–32)
Calcium: 9.1 mg/dL (ref 8.9–10.3)
Chloride: 105 mmol/L (ref 98–111)
Creatinine, Ser: 1.4 mg/dL — ABNORMAL HIGH (ref 0.61–1.24)
GFR calc Af Amer: 53 mL/min — ABNORMAL LOW (ref >60)
GFR calc non Af Amer: 45 mL/min — ABNORMAL LOW (ref >60)
Glucose, Bld: 114 mg/dL — ABNORMAL HIGH (ref 70–99)
Potassium: 3.9 mmol/L (ref 3.5–5.1)
Sodium: 138 mmol/L (ref 135–145)
Total Bilirubin: 1.2 mg/dL (ref 0.3–1.2)
Total Protein: 7.6 g/dL (ref 6.5–8.1)

## 2019-03-26 LAB — URINALYSIS, COMPLETE (UACMP) WITH MICROSCOPIC
Bacteria, UA: NONE SEEN
Bilirubin Urine: NEGATIVE
Glucose, UA: NEGATIVE mg/dL
Hgb urine dipstick: NEGATIVE
Ketones, ur: NEGATIVE mg/dL
Leukocytes,Ua: NEGATIVE
Nitrite: NEGATIVE
Protein, ur: NEGATIVE mg/dL
Specific Gravity, Urine: 1.013 (ref 1.005–1.030)
pH: 5 (ref 5.0–8.0)

## 2019-03-26 LAB — CBC
HCT: 30.6 % — ABNORMAL LOW (ref 39.0–52.0)
Hemoglobin: 10.4 g/dL — ABNORMAL LOW (ref 13.0–17.0)
MCH: 33.9 pg (ref 26.0–34.0)
MCHC: 34 g/dL (ref 30.0–36.0)
MCV: 99.7 fL (ref 80.0–100.0)
Platelets: 84 10*3/uL — ABNORMAL LOW (ref 150–400)
RBC: 3.07 MIL/uL — ABNORMAL LOW (ref 4.22–5.81)
RDW: 15.9 % — ABNORMAL HIGH (ref 11.5–15.5)
WBC: 12.2 10*3/uL — ABNORMAL HIGH (ref 4.0–10.5)
nRBC: 0 % (ref 0.0–0.2)

## 2019-03-26 LAB — LIPASE, BLOOD: Lipase: 33 U/L (ref 11–51)

## 2019-03-26 MED ORDER — IOHEXOL 300 MG/ML  SOLN: 25.0000 mL | Freq: Once | INTRAMUSCULAR | Status: DC | PRN

## 2019-03-26 MED ORDER — SODIUM CHLORIDE 0.9% FLUSH: 3.0000 mL | Freq: Once | INTRAVENOUS | Status: DC

## 2019-03-26 MED ORDER — OXYCODONE HCL 5 MG PO TABS: 2.5000 mg | ORAL_TABLET | Freq: Four times a day (QID) | ORAL | 0 refills | Status: DC | PRN

## 2019-03-26 MED ORDER — IOHEXOL 300 MG/ML  SOLN
75.0000 mL | Freq: Once | INTRAMUSCULAR | Status: AC | PRN
  Administered 2019-03-26: 75 mL via INTRAVENOUS

## 2019-03-26 NOTE — ED Triage Notes (Signed)
Pt denies pain elsewhere and denies NVD or SOB.

## 2019-03-26 NOTE — ED Triage Notes (Signed)
Pt here with daughter. Daughter reports pt with abdominal pain and tingling to both legs since 9am this morning.

## 2019-03-26 NOTE — ED Notes (Signed)
ED Provider at bedside. 

## 2019-03-26 NOTE — Discharge Instructions (Addendum)
As we discussed, your lab work was overall reassuring.  Your CT scan did show a possible left sided 3 mm kidney stone.  While this could explain your abdominal pain, it likely did not cause your pain in your legs.  Drink at least 6 to 8 glasses of water daily.  Take Tylenol 500 mg tablet every 6 hours for the next 2 to 3 days, then as needed for pain.  Take this scheduled, even if you are not hurting severely.  I would recommend taking with food.  You can also take the prescribed stronger pain medication as needed.  Follow-up with your regular doctor, Dr. Sabra Heck, for further follow-up of this.  Of note, your labs also showed low platelet count.  This has been a chronic issue but it is slightly lower than it was last week.  I discussed with Dr. Janese Banks of hematology today, and she has asked that you contact the office to discuss follow-up.

## 2019-03-26 NOTE — ED Notes (Signed)
Daughter called and updated. Will wait in car.

## 2019-03-26 NOTE — ED Provider Notes (Signed)
[facility] Emergency Department Provider Note  ____________________________________________   First MD Initiated Contact with Patient 03/26/19 1823     (approximate)  I have reviewed the triage vital signs and the nursing notes.   HISTORY  Chief Complaint Abdominal Pain and Numbness    HPI Andres Gutierrez is a 85 y.o. male with h/o AFib, CHF, CKD, cardiomyopathy, here with abd pain and bilateral foot tingling. Pt reports that his sx started this morning. He woke up at around 9:00 AM and felt fine. He started his day and states he began to experience cramping, aching pain in shi b/l thighs and lower legs, along with a significant lower abd pain. He felt bloated as well. His pain persistent throughout the day so he called his daughter, who brought him here. He took a tylenol and states his pain is improving since then. No specific alleviating or aggravating factors. He did not experience any weakness. No back pain. He did have two BMs today which is somewhat abnormal, but they were normal and not bloody. No nausea, vomiting. No fever or chills. No urinary sx.       Past Medical History:  Diagnosis Date   Anemia    Anxiety    Atrial fibrillation (HCC)    Cardiomyopathy (HCC)    CHF (congestive heart failure) (HCC)    Chronic kidney disease    kidney stones   Coronary artery disease    Cough    Dysrhythmia    Hypertension    Lymphadenopathy, hilar    MI (myocardial infarction) ([place])    x 2   Wheezing     Patient Active Problem List   Diagnosis Date Noted   Orthostatic hypotension 01/07/2019   Macrocytic anemia 08/05/2018   Anemia of chronic kidney failure, stage 3 (moderate) 08/05/2018   BPH (benign prostatic hyperplasia) 07/24/2018   Gastroesophageal reflux disease with esophagitis 07/24/2018   Stage 3 chronic kidney disease 07/24/2018   Thrombocytopenia ([place]) 07/24/2018   Primary osteoarthritis of both knees 08/22/2017    Chronic systolic heart failure ([place]) 01/17/2016   Chewing tobacco use 01/17/2016   Atrial fibrillation with RVR ([place]) 12/23/2015   Dyspnea on exertion 12/23/2015   Elevated troponin 12/23/2015   GERD (gastroesophageal reflux disease) 12/23/2015   Iron deficiency anemia due to chronic blood loss 12/20/2015   Femoral neck fracture, left, closed, initial encounter 11/03/2015   Chronic atrial fibrillation (HCC)    Coronary artery disease involving native coronary artery of native heart without angina pectoris    Acalculous cholecystitis 10/29/2015   Adenopathy    CAD (coronary artery disease) 12/22/2014   HTN (hypertension) 12/22/2014    Past Surgical History:  Procedure Laterality Date   BRONCHIAL NEEDLE ASPIRATION BIOPSY N/A 12/31/2014   Procedure: BRONCHIAL NEEDLE ASPIRATION BIOPSIES from carina;  Surgeon: Flora Lipps, MD;  Location: [facility] ORS;  Service: Cardiopulmonary;  Laterality: N/A;   CARDIAC CATHETERIZATION N/A 12/28/2015   Procedure: Left Heart Cath and Cors/Grafts Angiography;  Surgeon: Yolonda Kida, MD;  Location: [place] [facility];  Service: Cardiovascular;  Laterality: N/A;   CATARACT EXTRACTION W/PHACO Left 08/01/2016   Procedure: CATARACT EXTRACTION PHACO AND INTRAOCULAR LENS PLACEMENT ([place]) Left;  Surgeon: Leandrew Koyanagi, MD;  Location: [place];  Service: Ophthalmology;  Laterality: Left;   CORONARY ANGIOPLASTY WITH STENT PLACEMENT     CORONARY ARTERY BYPASS GRAFT     ENDOBRONCHIAL ULTRASOUND N/A 12/31/2014   Procedure: ENDOBRONCHIAL ULTRASOUND;  Surgeon: Flora Lipps, MD;  Location:  [facility] ORS;  Service: Cardiopulmonary;  Laterality: N/A;   ESOPHAGOGASTRODUODENOSCOPY (EGD) WITH PROPOFOL N/A 06/20/2015   Procedure: ESOPHAGOGASTRODUODENOSCOPY (EGD) WITH PROPOFOL;  Surgeon: Lollie Sails, MD;  Location: [facility];  Service: Endoscopy;  Laterality: N/A;    Prior to Admission medications   Medication Sig Start Date End Date  Taking? Authorizing Provider  acetaminophen (TYLENOL) 500 MG tablet Take 500 mg by mouth every 6 (six) hours as needed for mild pain or moderate pain.    [provider]  atorvastatin (LIPITOR) 40 MG tablet Take 1 tablet (40 mg total) by mouth daily at 6 PM. 12/30/15   Dustin Flock, MD  digoxin (LANOXIN) 0.125 MG tablet Take 0.0625 mg by mouth daily.    [provider]  docusate sodium (COLACE) 100 MG capsule Take 100 mg by mouth daily as needed.     [provider]  ferrous sulfate 325 (65 FE) MG tablet Take 1 tablet (325 mg total) by mouth every other day. 03/04/19   Verlon Au, NP  furosemide (LASIX) 20 MG tablet Take 40 mg by mouth as needed. For weight gain greater than 2 pounds in 24 hours or greater than 5 pounds in one week    [provider]  metoprolol tartrate (LOPRESSOR) 25 MG tablet Take 0.5 tablets (12.5 mg total) by mouth 2 (two) times daily. 01/09/19   Nicholes Mango, MD  nitroGLYCERIN (NITROSTAT) 0.4 MG SL tablet Place 0.4 mg under the tongue every 5 (five) minutes as needed for chest pain.    [provider]  oxyCODONE (ROXICODONE) 5 MG immediate release tablet Take 0.5 tablets (2.5 mg total) by mouth every 6 (six) hours as needed for severe pain. 03/26/19 03/25/20  Duffy Bruce, MD  pantoprazole (PROTONIX) 40 MG tablet Take 40 mg by mouth 2 (two) times daily before a meal.     [provider]  polyethylene glycol (MIRALAX / GLYCOLAX) 17 g packet Take 17 g by mouth daily as needed for mild constipation. 01/09/19   Nicholes Mango, MD  ranolazine (RANEXA) 1000 MG SR tablet Take 1 tablet by mouth daily. 01/16/19   [provider]  sucralfate (CARAFATE) 1 G tablet Take 1 g by mouth 4 (four) times daily.     [provider]  tamsulosin (FLOMAX) 0.4 MG CAPS capsule Take 0.4 mg by mouth daily after supper.     [provider]  vitamin C (VITAMIN C) 250 MG tablet Take 1 tablet (250 mg total) by mouth daily.  11/01/15   Dustin Flock, MD    Allergies 5ht3 receptor antagonists, Diphenhydramine hcl, Escitalopram, Maxidex [dexamethasone], Prednisone, Serotonin, Vytorin [ezetimibe-simvastatin], Zocor [simvastatin], and Diltiazem  Family History  Problem Relation Age of Onset   CAD Mother    Colon cancer Father     Social History Social History   Tobacco Use   Smoking status: Former Smoker    Quit date: 06/27/1977    Years since quitting: 41.7   Smokeless tobacco: Current User    Types: Chew  Substance Use Topics   Alcohol use: Yes    Comment: occational almost rare once a year   Drug use: No    Review of Systems  Review of Systems  Constitutional: Positive for fatigue. Negative for chills and fever.  HENT: Negative for sore throat.   Respiratory: Negative for shortness of breath.   Cardiovascular: Negative for chest pain.  Gastrointestinal: Positive for abdominal pain.  Genitourinary: Negative for flank pain.  Musculoskeletal: Positive for arthralgias and  myalgias. Negative for neck pain.  Skin: Negative for rash and wound.  Allergic/Immunologic: Negative for immunocompromised state.  Neurological: Negative for weakness and numbness.  Hematological: Does not bruise/bleed easily.  All other systems reviewed and are negative.    ____________________________________________  PHYSICAL EXAM:      VITAL SIGNS: ED Triage Vitals  Enc Vitals Group     BP 03/26/19 1459 132/77     Pulse Rate 03/26/19 1459 84     Resp 03/26/19 1459 18     Temp 03/26/19 1517 98.3 F (36.8 C)     Temp Source 03/26/19 1517 Oral     SpO2 03/26/19 1459 97 %     Weight 03/26/19 1457 195 lb (88.5 kg)     Height 03/26/19 1457 6\' 4"  (1.93 m)     Head Circumference --      Peak Flow --      Pain Score 03/26/19 1457 3     Pain Loc --      Pain Edu? --      Excl. in [place]? --      Physical Exam Vitals signs and nursing note reviewed.  Constitutional:      General: He is not in acute  distress.    Appearance: He is well-developed.  HENT:     Head: Normocephalic and atraumatic.  Eyes:     Conjunctiva/sclera: Conjunctivae normal.  Neck:     Musculoskeletal: Neck supple.  Cardiovascular:     Rate and Rhythm: Normal rate and regular rhythm.     Heart sounds: Normal heart sounds. No murmur. No friction rub.     Comments: 2+ DP/PT pulses b/l Pulmonary:     Effort: Pulmonary effort is normal. No respiratory distress.     Breath sounds: Normal breath sounds. No wheezing or rales.  Abdominal:     General: There is no distension.     Palpations: Abdomen is soft.     Tenderness: There is abdominal tenderness in the right lower quadrant, suprapubic area and left lower quadrant. There is no guarding or rebound.  Skin:    General: Skin is warm.     Capillary Refill: Capillary refill takes less than 2 seconds.  Neurological:     Mental Status: He is alert and oriented to person, place, and time.     Motor: No abnormal muscle tone.     Comments: Strength 5/5 bl LE. Normal sensation to light touch. Quad reflexes 1+ and symmetric.       ____________________________________________   LABS (all labs ordered are listed, but only abnormal results are displayed)  Labs Reviewed  COMPREHENSIVE METABOLIC PANEL - Abnormal; Notable for the following components:      Result Value   Glucose, Bld 114 (*)    BUN 31 (*)    Creatinine, Ser 1.40 (*)    GFR calc non Af Amer 45 (*)    GFR calc Af Amer 53 (*)    All other components within normal limits  CBC - Abnormal; Notable for the following components:   WBC 12.2 (*)    RBC 3.07 (*)    Hemoglobin 10.4 (*)    HCT 30.6 (*)    RDW 15.9 (*)    Platelets 84 (*)    All other components within normal limits  URINALYSIS, COMPLETE (UACMP) WITH MICROSCOPIC - Abnormal; Notable for the following components:   Color, Urine YELLOW (*)    APPearance HAZY (*)    All other components within normal limits  LIPASE, BLOOD  TROPONIN I (HIGH  SENSITIVITY)  TROPONIN I (HIGH SENSITIVITY)    ____________________________________________  EKG: Sinus rhythm, IVCD, TWI noted in inferior leads and lateral precordial leads. No acute changes from prior. VR 85, PR 262, QRS 130, QTc 459. ________________________________________  RADIOLOGY All imaging, including plain films, CT scans, and ultrasounds, independently reviewed by me, and interpretations confirmed via formal radiology reads.  ED MD interpretation:   CT A/P: Left ureteral stone, R renal stone, no acute abnormality otherwise, no obstruction  Official radiology report(s): Ct Abdomen Pelvis W Contrast  Result Date: 03/26/2019 CLINICAL DATA:  85 year old male with history of nausea, vomiting and abdominal pain. EXAM: CT ABDOMEN AND PELVIS WITH CONTRAST TECHNIQUE: Multidetector CT imaging of the abdomen and pelvis was performed using the standard protocol following bolus administration of intravenous contrast. CONTRAST:  14mL OMNIPAQUE IOHEXOL 300 MG/ML  SOLN COMPARISON:  CT the abdomen and pelvis 10/20/2018. FINDINGS: Lower chest: Chronic pleural thickening and pleural calcification in the posteromedial aspect of the right hemithorax, similar to prior examinations. Atherosclerotic calcifications in the descending thoracic aorta as well as the left anterior descending and right coronary arteries. Pericardial calcifications, most evident over the right atrioventricular groove. Areas of mild scarring in the lung bases bilaterally. Hepatobiliary: No suspicious cystic or solid hepatic lesions. No intra or extrahepatic biliary ductal dilatation. Gallbladder is normal in appearance. Pancreas: No pancreatic mass. No pancreatic ductal dilatation. No pancreatic or peripancreatic fluid collections or inflammatory changes. Spleen: Unremarkable. Adrenals/Urinary Tract: 2.5 cm low-attenuation lesion in the interpolar region of the right kidney, compatible with a simple cyst. Other subcentimeter  low-attenuation lesions in both kidneys, too small to characterize, but statistically likely to represent tiny cysts. 3 mm nonobstructive calculus in the lower pole collecting system of the right kidney. Bilateral adrenal glands are normal in appearance. 3 mm calculus in the distal third of the left ureter (axial image 69 of series 2). No proximal hydroureteronephrosis to indicate obstruction at this time. Urinary bladder is normal in appearance. Stomach/Bowel: Normal appearance of the stomach. No pathologic dilatation of small bowel or colon. Postoperative changes of partial colectomy are noted in the region of the cecum. The appendix is not visualized, presumably surgically absent. Vascular/Lymphatic: Aortic atherosclerosis, without evidence of aneurysm or dissection in the abdominal or pelvic vasculature. No lymphadenopathy noted in the abdomen or pelvis. Reproductive: Prostate gland and seminal vesicles are unremarkable in appearance. Other: Trace volume of ascites in the low anatomic pelvis. No pneumoperitoneum. Musculoskeletal: There are no aggressive appearing lytic or blastic lesions noted in the visualized portions of the skeleton. IMPRESSION: 1. 3 mm calculus in the distal third of the left ureter. No proximal hydroureteronephrosis to indicate urinary tract obstruction at this time. 2. Trace volume of ascites. 3. 3 mm nonobstructive calculus in the lower pole collecting system of the right kidney. 4. Aortic atherosclerosis, in addition to least 2 vessel coronary artery disease. 5. Additional incidental findings, similar to prior studies, as above. Electronically Signed   By: Vinnie Langton M.D.   On: 03/26/2019 19:50    ____________________________________________  PROCEDURES   Procedure(s) performed (including Critical Care):  Procedures  ____________________________________________  INITIAL IMPRESSION / MDM / [place] / ED COURSE  As part of my medical decision making, I  reviewed the following data within the [place] notes reviewed and incorporated, Old chart reviewed, Notes from prior ED visits, and [place] Controlled Substance Database       *ARDA KEADLE was  evaluated in Emergency Department on 03/27/2019 for the symptoms described in the history of present illness. He was evaluated in the context of the global COVID-19 pandemic, which necessitated consideration that the patient might be at risk for infection with the SARS-CoV-2 virus that causes COVID-19. Institutional protocols and algorithms that pertain to the evaluation of patients at risk for COVID-19 are in a state of rapid change based on information released by regulatory bodies including the CDC and federal and state organizations. These policies and algorithms were followed during the patient's care in the ED.  Some ED evaluations and interventions may be delayed as a result of limited staffing during the pandemic.*     Medical Decision Making:  85 yo M here with abd pain, b/l leg pain. Now resolved. Unclear etiology based on labs, imaging. CMP, lipase, UA all unremarkable. No signs of UTI or intra-abd infection. CBC does show acute on chronic thrombocytopenia, but is otherwise unremarkable. Discussed with Dr. Janese Banks who will send message to primary hematologist for outpt f/u. CT A/P is largely unremarkable, though he does have a 3 mm stone in left ureter. No signs of severe obstruction.   Possible etiologies include renal stone causing lower abd pain w/ possible referred pain to legs, also consideration of exacerbation of degen disease of his back, though leg pains were bilateral. No signs of cauda equina. Pt feels completely better here and is requesting d/c. Given his well appearance and reassuring work-up and imaging, feel this is reasonable. He will f/u with his hematologist.   ____________________________________________  FINAL CLINICAL IMPRESSION(S) / ED DIAGNOSES  Final  diagnoses:  Left lower quadrant abdominal pain  Nephrolithiasis  Paresthesias  Thrombocytopenia ([place])     MEDICATIONS GIVEN DURING THIS VISIT:  Medications  iohexol (OMNIPAQUE) 300 MG/ML solution 75 mL (75 mLs Intravenous Contrast Given 03/26/19 1928)     ED Discharge Orders         Ordered    oxyCODONE (ROXICODONE) 5 MG immediate release tablet  Every 6 hours PRN     03/26/19 2048           Note:  This document was prepared using Dragon voice recognition software and may include unintentional dictation errors.   Duffy Bruce, MD 03/27/19 [phone]

## 2019-03-27 ENCOUNTER — Telehealth: Payer: Self-pay | Admitting: Internal Medicine

## 2019-03-27 NOTE — Telephone Encounter (Signed)
Patient recently seen in the hospital/ER for abdominal pain.  Platelets down to 80s-baseline 130s.  Reasonable to monitor labs for now.  We will discuss with patient at next appointment on 11/18-will monitor labs for now.  FYI-

## 2019-03-30 DIAGNOSIS — I5022 Chronic systolic (congestive) heart failure: Secondary | ICD-10-CM | POA: Diagnosis not present

## 2019-03-30 DIAGNOSIS — N201 Calculus of ureter: Secondary | ICD-10-CM | POA: Diagnosis not present

## 2019-03-31 ENCOUNTER — Other Ambulatory Visit: Payer: Self-pay

## 2019-03-31 ENCOUNTER — Encounter: Payer: Self-pay | Admitting: Internal Medicine

## 2019-03-31 ENCOUNTER — Ambulatory Visit: Payer: PPO

## 2019-03-31 NOTE — Progress Notes (Signed)
Patient pre screened for office appointment, no questions or concerns today. Patient reminded of upcoming appointment time and date. 

## 2019-04-01 ENCOUNTER — Inpatient Hospital Stay: Payer: PPO

## 2019-04-01 ENCOUNTER — Other Ambulatory Visit: Payer: Self-pay

## 2019-04-01 ENCOUNTER — Inpatient Hospital Stay (HOSPITAL_BASED_OUTPATIENT_CLINIC_OR_DEPARTMENT_OTHER): Payer: PPO | Admitting: Internal Medicine

## 2019-04-01 ENCOUNTER — Encounter: Payer: Self-pay | Admitting: Internal Medicine

## 2019-04-01 DIAGNOSIS — N183 Chronic kidney disease, stage 3 unspecified: Secondary | ICD-10-CM | POA: Diagnosis not present

## 2019-04-01 DIAGNOSIS — D631 Anemia in chronic kidney disease: Secondary | ICD-10-CM | POA: Diagnosis not present

## 2019-04-01 DIAGNOSIS — I130 Hypertensive heart and chronic kidney disease with heart failure and stage 1 through stage 4 chronic kidney disease, or unspecified chronic kidney disease: Secondary | ICD-10-CM | POA: Diagnosis not present

## 2019-04-01 LAB — CBC WITH DIFFERENTIAL/PLATELET
Abs Immature Granulocytes: 0.44 10*3/uL — ABNORMAL HIGH (ref 0.00–0.07)
Basophils Absolute: 0 10*3/uL (ref 0.0–0.1)
Basophils Relative: 0 %
Eosinophils Absolute: 0 10*3/uL (ref 0.0–0.5)
Eosinophils Relative: 0 %
HCT: 31.3 % — ABNORMAL LOW (ref 39.0–52.0)
Hemoglobin: 10.2 g/dL — ABNORMAL LOW (ref 13.0–17.0)
Immature Granulocytes: 3 %
Lymphocytes Relative: 15 %
Lymphs Abs: 2.3 10*3/uL (ref 0.7–4.0)
MCH: 33.9 pg (ref 26.0–34.0)
MCHC: 32.6 g/dL (ref 30.0–36.0)
MCV: 104 fL — ABNORMAL HIGH (ref 80.0–100.0)
Monocytes Absolute: 3.6 10*3/uL — ABNORMAL HIGH (ref 0.1–1.0)
Monocytes Relative: 24 %
Neutro Abs: 8.9 10*3/uL — ABNORMAL HIGH (ref 1.7–7.7)
Neutrophils Relative %: 58 %
Platelets: 116 10*3/uL — ABNORMAL LOW (ref 150–400)
RBC: 3.01 MIL/uL — ABNORMAL LOW (ref 4.22–5.81)
RDW: 16.1 % — ABNORMAL HIGH (ref 11.5–15.5)
Smear Review: DECREASED
WBC: 15.2 10*3/uL — ABNORMAL HIGH (ref 4.0–10.5)
nRBC: 0 % (ref 0.0–0.2)

## 2019-04-01 LAB — COMPREHENSIVE METABOLIC PANEL
ALT: 22 U/L (ref 0–44)
AST: 19 U/L (ref 15–41)
Albumin: 4.5 g/dL (ref 3.5–5.0)
Alkaline Phosphatase: 59 U/L (ref 38–126)
Anion gap: 6 (ref 5–15)
BUN: 30 mg/dL — ABNORMAL HIGH (ref 8–23)
CO2: 26 mmol/L (ref 22–32)
Calcium: 8.6 mg/dL — ABNORMAL LOW (ref 8.9–10.3)
Chloride: 107 mmol/L (ref 98–111)
Creatinine, Ser: 1.5 mg/dL — ABNORMAL HIGH (ref 0.61–1.24)
GFR calc Af Amer: 49 mL/min — ABNORMAL LOW (ref >60)
GFR calc non Af Amer: 42 mL/min — ABNORMAL LOW (ref >60)
Glucose, Bld: 115 mg/dL — ABNORMAL HIGH (ref 70–99)
Potassium: 3.8 mmol/L (ref 3.5–5.1)
Sodium: 139 mmol/L (ref 135–145)
Total Bilirubin: 1 mg/dL (ref 0.3–1.2)
Total Protein: 7.8 g/dL (ref 6.5–8.1)

## 2019-04-01 LAB — LACTATE DEHYDROGENASE: LDH: 176 U/L (ref 98–192)

## 2019-04-01 NOTE — Assessment & Plan Note (Addendum)
#  Anemia-mild.  Since 2012.  Suspect MDS/chronic kidney disease.  No bone marrow biopsy.  Stable.   #Hemoglobin 10.8.  Continue Aranesp/IV iron as needed.  #Iron deficiency anemia-ferritin was 360 on 02/18/2019.  Continue oral iron.  #Mild intermittent thrombocytopenia-question ITP; recently 25s today.  Stable at 117.  #CKD stage III-creatinine stable at 1.42.  Continue to monitor.  #Left upper lobe cystic lesion-incidentally found on imaging with [facility].  Postinflammatory versus low-grade malignancy.  Await  chest CT without contrast in December 2020.  I spoke at length with the patient'sdaughter regarding the patient's clinical status/plan of care.  Family agreement.    #Disposition: # hold Aranesp today # in 2 weeks- cbc aranesp # in 4 weeks MD- cbc/bmp/LDH-possible aranesp-Dr.B

## 2019-04-03 NOTE — Progress Notes (Signed)
[place] NOTE  Patient Care Team: Rusty Aus, MD as PCP - General (Internal Medicine) Alisa Graff, FNP as Nurse Practitioner (Family Medicine) Ubaldo Glassing Javier Docker, MD as Consulting Physician (Cardiology) Erby Pian, MD as Referring Physician (Specialist)  CHIEF COMPLAINTS/PURPOSE OF CONSULTATION:   # 2012- Anemia 11-12- sec to CKD/IDA? MDS [no BMBx] [March 2017-sat-11%; ferritin-42] [Feb 2017- EGD/colo-July 2015 Dr.Skulskie];April 2017-PO Iron PO; May 2020-IV iron/Aranesp.  # Intermittent thrombocytopenia 120s-140s [Dec 2016-CT- neg cirrhosis/splenomegaly]  #June 2020 -LUL cystic lesion- 4x4cm ?  Question etiology [slightly increased from 2017]; question postinflammatory versus low-grade adenocarcinoma-repeat scan in 6 months [December 2020]  # CKD [creatinine 1.2- 1.4]; CAD/CHF  HISTORY OF PRESENTING ILLNESS:  Andres Gutierrez 85 y.o.  male history of above history of chronic anemia and intermittent thrombocytopenia is here for follow-up/ currently on IV iron and also Aranesp.  Patient was recently evaluated emergency room-abdominal pain-possible kidney stone.  Patient's platelets were low in the 80s.  Baseline >100s.  Denies any blood in stool black or stools.  Mild to moderate fatigue.  Review of Systems  Constitutional: Positive for malaise/fatigue. Negative for chills, diaphoresis, fever and weight loss.  HENT: Negative for nosebleeds and sore throat.   Eyes: Negative for double vision.  Respiratory: Negative for cough, hemoptysis, sputum production, shortness of breath and wheezing.   Cardiovascular: Negative for chest pain, palpitations, orthopnea and leg swelling.  Gastrointestinal: Negative for abdominal pain, blood in stool, constipation, diarrhea, heartburn, melena, nausea and vomiting.  Genitourinary: Negative for dysuria, frequency and urgency.  Musculoskeletal: Positive for back pain and joint pain.  Skin: Negative.  Negative for  itching and rash.  Neurological: Negative for dizziness, tingling, focal weakness, weakness and headaches.  Endo/Heme/Allergies: Does not bruise/bleed easily.  Psychiatric/Behavioral: Negative for depression. The patient is not nervous/anxious and does not have insomnia.      MEDICAL HISTORY:  Past Medical History:  Diagnosis Date  . Anemia   . Anxiety   . Atrial fibrillation ([place])   . Cardiomyopathy ([place])   . CHF (congestive heart failure) ([place])   . Chronic kidney disease    kidney stones  . Coronary artery disease   . Cough   . Dysrhythmia   . Hypertension   . Lymphadenopathy, hilar   . MI (myocardial infarction) ([place])    x 2  . Wheezing     SURGICAL HISTORY: Past Surgical History:  Procedure Laterality Date  . BRONCHIAL NEEDLE ASPIRATION BIOPSY N/A 12/31/2014   Procedure: BRONCHIAL NEEDLE ASPIRATION BIOPSIES from carina;  Surgeon: Flora Lipps, MD;  Location: [facility];  Service: Cardiopulmonary;  Laterality: N/A;  . CARDIAC CATHETERIZATION N/A 12/28/2015   Procedure: Left Heart Cath and Cors/Grafts Angiography;  Surgeon: Yolonda Kida, MD;  Location: [place] [facility];  Service: Cardiovascular;  Laterality: N/A;  . CATARACT EXTRACTION W/PHACO Left 08/01/2016   Procedure: CATARACT EXTRACTION PHACO AND INTRAOCULAR LENS PLACEMENT ([place]) Left;  Surgeon: Leandrew Koyanagi, MD;  Location: [place];  Service: Ophthalmology;  Laterality: Left;  . CORONARY ANGIOPLASTY WITH STENT PLACEMENT    . CORONARY ARTERY BYPASS GRAFT    . ENDOBRONCHIAL ULTRASOUND N/A 12/31/2014   Procedure: ENDOBRONCHIAL ULTRASOUND;  Surgeon: Flora Lipps, MD;  Location: [facility];  Service: Cardiopulmonary;  Laterality: N/A;  . ESOPHAGOGASTRODUODENOSCOPY (EGD) WITH PROPOFOL N/A 06/20/2015   Procedure: ESOPHAGOGASTRODUODENOSCOPY (EGD) WITH PROPOFOL;  Surgeon: Lollie Sails, MD;  Location: [facility];  Service: Endoscopy;  Laterality: N/A;    SOCIAL HISTORY: lives  in [place]; alone. Used to  work in Academic librarian.  Social History   Socioeconomic History  . Marital status: Widowed    Spouse name: Not on file  . Number of children: 2  . Years of education: college  . Highest education level: Some college, no degree  Occupational History  . Occupation: retired  Scientific laboratory technician  . Financial resource strain: Not hard at all  . Food insecurity    Worry: Never true    Inability: Never true  . Transportation needs    Medical: No    Non-medical: No  Tobacco Use  . Smoking status: Former Smoker    Quit date: 06/27/1977    Years since quitting: 41.7  . Smokeless tobacco: Current User    Types: Chew  Substance and Sexual Activity  . Alcohol use: Yes    Comment: occational almost rare once a year  . Drug use: No  . Sexual activity: Never    Birth control/protection: Abstinence  Lifestyle  . Physical activity    Days per week: 7 days    Minutes per session: 30 min  . Stress: Not at all  Relationships  . Social connections    Talks on phone: More than three times a week    Gets together: More than three times a week    Attends religious service: More than 4 times per year    Active member of club or organization: No    Attends meetings of clubs or organizations: Never    Relationship status: Widowed  . Intimate partner violence    Fear of current or ex partner: Patient refused    Emotionally abused: Patient refused    Physically abused: Patient refused    Forced sexual activity: Patient refused  Other Topics Concern  . Not on file  Social History Narrative  . Not on file    FAMILY HISTORY: Family History  Problem Relation Age of Onset  . CAD Mother   . Colon cancer Father     ALLERGIES:  is allergic to 5ht3 receptor antagonists; diphenhydramine hcl; escitalopram; maxidex [dexamethasone]; prednisone; serotonin; vytorin [ezetimibe-simvastatin]; zocor [simvastatin]; and diltiazem.  MEDICATIONS:  Current Outpatient Medications  Medication Sig Dispense Refill  .  acetaminophen (TYLENOL) 500 MG tablet Take 500 mg by mouth every 6 (six) hours as needed for mild pain or moderate pain.    Marland Kitchen atorvastatin (LIPITOR) 40 MG tablet Take 1 tablet (40 mg total) by mouth daily at 6 PM. 30 tablet 0  . digoxin (LANOXIN) 0.125 MG tablet Take 0.0625 mg by mouth daily.    Marland Kitchen docusate sodium (COLACE) 100 MG capsule Take 100 mg by mouth daily as needed.     . ferrous sulfate 325 (65 FE) MG tablet Take 1 tablet (325 mg total) by mouth every other day. 30 tablet 3  . furosemide (LASIX) 20 MG tablet Take 40 mg by mouth as needed. For weight gain greater than 2 pounds in 24 hours or greater than 5 pounds in one week    . metoprolol tartrate (LOPRESSOR) 25 MG tablet Take 0.5 tablets (12.5 mg total) by mouth 2 (two) times daily. 30 tablet 0  . nitroGLYCERIN (NITROSTAT) 0.4 MG SL tablet Place 0.4 mg under the tongue every 5 (five) minutes as needed for chest pain.    Marland Kitchen oxyCODONE (ROXICODONE) 5 MG immediate release tablet Take 0.5 tablets (2.5 mg total) by mouth every 6 (six) hours as needed for severe pain. 8 tablet 0  . pantoprazole (PROTONIX) 40  MG tablet Take 40 mg by mouth 2 (two) times daily before a meal.     . polyethylene glycol (MIRALAX / GLYCOLAX) 17 g packet Take 17 g by mouth daily as needed for mild constipation. 14 each 0  . ranolazine (RANEXA) 1000 MG SR tablet Take 1 tablet by mouth daily.    . sucralfate (CARAFATE) 1 G tablet Take 1 g by mouth 4 (four) times daily.     . tamsulosin (FLOMAX) 0.4 MG CAPS capsule Take 0.4 mg by mouth daily after supper.     . vitamin C (VITAMIN C) 250 MG tablet Take 1 tablet (250 mg total) by mouth daily. 30 tablet 0   No current facility-administered medications for this visit.      PHYSICAL EXAMINATION: ECOG PERFORMANCE STATUS: 0 - Asymptomatic  Vitals:   04/01/19 1409  BP: (!) 154/72  Pulse: 88  Resp: 18  Temp: 98 F (36.7 C)   Filed Weights   04/01/19 1409  Weight: 204 lb (92.5 kg)    Physical Exam  Constitutional:  He is oriented to person, place, and time.  Elderly Caucasian male patient. He is alone.  Walking by with a walker.  HENT:  Head: Normocephalic and atraumatic.  Mouth/Throat: Oropharynx is clear and moist. No oropharyngeal exudate.  Eyes: Pupils are equal, round, and reactive to light.  Neck: Normal range of motion. Neck supple.  Cardiovascular: Normal rate and regular rhythm.  Pulmonary/Chest: Breath sounds normal. No respiratory distress. He has no wheezes.  Abdominal: Soft. Bowel sounds are normal. He exhibits no distension and no mass. There is no abdominal tenderness. There is no rebound and no guarding.  Musculoskeletal: Normal range of motion.        General: No tenderness or edema.  Neurological: He is alert and oriented to person, place, and time.  Skin: Skin is warm.  Psychiatric: Affect normal.   .   LABORATORY DATA:  I have reviewed the data as listed Lab Results  Component Value Date   WBC 15.2 (H) 04/01/2019   HGB 10.2 (L) 04/01/2019   HCT 31.3 (L) 04/01/2019   MCV 104.0 (H) 04/01/2019   PLT 116 (L) 04/01/2019   Recent Labs    01/07/19 0711  03/18/19 1330 03/26/19 1501 04/01/19 1346  NA 140   < > 138 138 139  K 4.3   < > 4.3 3.9 3.8  CL 109   < > 106 105 107  CO2 21*   < > _0 GLUCOSE 99   < > 112* 114* 115*  BUN 24*   < > 32* 31* 30*  CREATININE 1.51*   < > 1.52* 1.40* 1.50*  CALCIUM 8.8*   < > 9.3 9.1 8.6*  GFRNONAA 42*   < > 41* 45* 42*  GFRAA 48*   < > 48* 53* 49*  PROT 7.8  --   --  7.6 7.8  ALBUMIN 3.8  --   --  4.3 4.5  AST 21  --   --  20 19  ALT 15  --   --  20 22  ALKPHOS 59  --   --  59 59  BILITOT 0.9  --   --  1.2 1.0  BILIDIR 0.2  --   --   --   --   IBILI 0.7  --   --   --   --    < > = values in this interval not displayed.   ASSESSMENT &  PLAN:   Anemia of chronic kidney failure, stage 3 (moderate) ([place]) # Anemia-mild.  Since 2012.  Suspect MDS/chronic kidney disease.  No bone marrow biopsy.  Stable.   #Hemoglobin 10.8.   Continue Aranesp/IV iron as needed.  #Iron deficiency anemia-ferritin was 360 on 02/18/2019.  Continue oral iron.  #Mild intermittent thrombocytopenia-question ITP; recently 59s today.  Stable at 117.  #CKD stage III-creatinine stable at 1.42.  Continue to monitor.  #Left upper lobe cystic lesion-incidentally found on imaging with [facility].  Postinflammatory versus low-grade malignancy.  Await  chest CT without contrast in December 2020.  I spoke at length with the patient'sdaughter regarding the patient's clinical status/plan of care.  Family agreement.    #Disposition: # hold Aranesp today # in 2 weeks- cbc aranesp # in 4 weeks MD- cbc/bmp/LDH-possible aranesp-Dr.B      Cammie Sickle, MD 04/03/2019 1:13 PM

## 2019-04-13 DIAGNOSIS — L97522 Non-pressure chronic ulcer of other part of left foot with fat layer exposed: Secondary | ICD-10-CM | POA: Diagnosis not present

## 2019-04-13 DIAGNOSIS — L97511 Non-pressure chronic ulcer of other part of right foot limited to breakdown of skin: Secondary | ICD-10-CM | POA: Diagnosis not present

## 2019-04-15 ENCOUNTER — Inpatient Hospital Stay: Payer: PPO

## 2019-04-15 ENCOUNTER — Other Ambulatory Visit: Payer: Self-pay

## 2019-04-15 ENCOUNTER — Inpatient Hospital Stay: Payer: PPO | Attending: Internal Medicine

## 2019-04-15 VITALS — BP 148/71 | HR 71

## 2019-04-15 DIAGNOSIS — Z87891 Personal history of nicotine dependence: Secondary | ICD-10-CM | POA: Insufficient documentation

## 2019-04-15 DIAGNOSIS — Z79899 Other long term (current) drug therapy: Secondary | ICD-10-CM | POA: Diagnosis not present

## 2019-04-15 DIAGNOSIS — I252 Old myocardial infarction: Secondary | ICD-10-CM | POA: Diagnosis not present

## 2019-04-15 DIAGNOSIS — I4891 Unspecified atrial fibrillation: Secondary | ICD-10-CM | POA: Diagnosis not present

## 2019-04-15 DIAGNOSIS — I130 Hypertensive heart and chronic kidney disease with heart failure and stage 1 through stage 4 chronic kidney disease, or unspecified chronic kidney disease: Secondary | ICD-10-CM | POA: Diagnosis not present

## 2019-04-15 DIAGNOSIS — D500 Iron deficiency anemia secondary to blood loss (chronic): Secondary | ICD-10-CM

## 2019-04-15 DIAGNOSIS — I509 Heart failure, unspecified: Secondary | ICD-10-CM | POA: Diagnosis not present

## 2019-04-15 DIAGNOSIS — D631 Anemia in chronic kidney disease: Secondary | ICD-10-CM | POA: Insufficient documentation

## 2019-04-15 DIAGNOSIS — I2510 Atherosclerotic heart disease of native coronary artery without angina pectoris: Secondary | ICD-10-CM | POA: Diagnosis not present

## 2019-04-15 DIAGNOSIS — Z87442 Personal history of urinary calculi: Secondary | ICD-10-CM | POA: Insufficient documentation

## 2019-04-15 DIAGNOSIS — R918 Other nonspecific abnormal finding of lung field: Secondary | ICD-10-CM | POA: Diagnosis not present

## 2019-04-15 DIAGNOSIS — N183 Chronic kidney disease, stage 3 unspecified: Secondary | ICD-10-CM | POA: Insufficient documentation

## 2019-04-15 LAB — CBC WITH DIFFERENTIAL/PLATELET
Abs Immature Granulocytes: 0.25 10*3/uL — ABNORMAL HIGH (ref 0.00–0.07)
Basophils Absolute: 0 10*3/uL (ref 0.0–0.1)
Basophils Relative: 0 %
Eosinophils Absolute: 0 10*3/uL (ref 0.0–0.5)
Eosinophils Relative: 0 %
HCT: 30.4 % — ABNORMAL LOW (ref 39.0–52.0)
Hemoglobin: 9.7 g/dL — ABNORMAL LOW (ref 13.0–17.0)
Immature Granulocytes: 2 %
Lymphocytes Relative: 19 %
Lymphs Abs: 2.1 10*3/uL (ref 0.7–4.0)
MCH: 33.7 pg (ref 26.0–34.0)
MCHC: 31.9 g/dL (ref 30.0–36.0)
MCV: 105.6 fL — ABNORMAL HIGH (ref 80.0–100.0)
Monocytes Absolute: 2.8 10*3/uL — ABNORMAL HIGH (ref 0.1–1.0)
Monocytes Relative: 26 %
Neutro Abs: 5.7 10*3/uL (ref 1.7–7.7)
Neutrophils Relative %: 53 %
Platelets: 137 10*3/uL — ABNORMAL LOW (ref 150–400)
RBC: 2.88 MIL/uL — ABNORMAL LOW (ref 4.22–5.81)
RDW: 16.2 % — ABNORMAL HIGH (ref 11.5–15.5)
WBC: 10.9 10*3/uL — ABNORMAL HIGH (ref 4.0–10.5)
nRBC: 0 % (ref 0.0–0.2)

## 2019-04-15 MED ORDER — DARBEPOETIN ALFA 300 MCG/0.6ML IJ SOSY
300.0000 ug | PREFILLED_SYRINGE | Freq: Once | INTRAMUSCULAR | Status: AC
  Administered 2019-04-15: 300 ug via SUBCUTANEOUS
  Filled 2019-04-15: qty 0.6

## 2019-04-21 DIAGNOSIS — I25810 Atherosclerosis of coronary artery bypass graft(s) without angina pectoris: Secondary | ICD-10-CM | POA: Diagnosis not present

## 2019-04-21 DIAGNOSIS — I10 Essential (primary) hypertension: Secondary | ICD-10-CM | POA: Diagnosis not present

## 2019-04-21 DIAGNOSIS — I482 Chronic atrial fibrillation, unspecified: Secondary | ICD-10-CM | POA: Diagnosis not present

## 2019-04-21 DIAGNOSIS — I255 Ischemic cardiomyopathy: Secondary | ICD-10-CM | POA: Diagnosis not present

## 2019-04-21 DIAGNOSIS — I5022 Chronic systolic (congestive) heart failure: Secondary | ICD-10-CM | POA: Diagnosis not present

## 2019-04-23 DIAGNOSIS — M1712 Unilateral primary osteoarthritis, left knee: Secondary | ICD-10-CM | POA: Diagnosis not present

## 2019-04-23 DIAGNOSIS — M1711 Unilateral primary osteoarthritis, right knee: Secondary | ICD-10-CM | POA: Diagnosis not present

## 2019-04-27 ENCOUNTER — Ambulatory Visit
Admission: RE | Admit: 2019-04-27 | Discharge: 2019-04-27 | Disposition: A | Discharge status: Home / Self Care | Payer: PPO | Source: Ambulatory Visit | Attending: Gastroenterology | Admitting: Gastroenterology

## 2019-04-27 ENCOUNTER — Other Ambulatory Visit: Payer: Self-pay

## 2019-04-27 DIAGNOSIS — J439 Emphysema, unspecified: Secondary | ICD-10-CM | POA: Diagnosis not present

## 2019-04-27 DIAGNOSIS — R911 Solitary pulmonary nodule: Secondary | ICD-10-CM | POA: Diagnosis not present

## 2019-04-27 DIAGNOSIS — R918 Other nonspecific abnormal finding of lung field: Secondary | ICD-10-CM | POA: Diagnosis not present

## 2019-04-29 ENCOUNTER — Inpatient Hospital Stay (HOSPITAL_BASED_OUTPATIENT_CLINIC_OR_DEPARTMENT_OTHER): Payer: PPO | Admitting: Internal Medicine

## 2019-04-29 ENCOUNTER — Inpatient Hospital Stay: Payer: PPO

## 2019-04-29 ENCOUNTER — Other Ambulatory Visit: Payer: Self-pay

## 2019-04-29 DIAGNOSIS — D631 Anemia in chronic kidney disease: Secondary | ICD-10-CM

## 2019-04-29 DIAGNOSIS — N183 Chronic kidney disease, stage 3 unspecified: Secondary | ICD-10-CM | POA: Diagnosis not present

## 2019-04-29 DIAGNOSIS — D500 Iron deficiency anemia secondary to blood loss (chronic): Secondary | ICD-10-CM

## 2019-04-29 DIAGNOSIS — I130 Hypertensive heart and chronic kidney disease with heart failure and stage 1 through stage 4 chronic kidney disease, or unspecified chronic kidney disease: Secondary | ICD-10-CM | POA: Diagnosis not present

## 2019-04-29 LAB — CBC WITH DIFFERENTIAL/PLATELET
Abs Immature Granulocytes: 0.3 10*3/uL — ABNORMAL HIGH (ref 0.00–0.07)
Band Neutrophils: 1 %
Basophils Absolute: 0 10*3/uL (ref 0.0–0.1)
Basophils Relative: 0 %
Eosinophils Absolute: 0 10*3/uL (ref 0.0–0.5)
Eosinophils Relative: 0 %
HCT: 31.7 % — ABNORMAL LOW (ref 39.0–52.0)
Hemoglobin: 9.9 g/dL — ABNORMAL LOW (ref 13.0–17.0)
Lymphocytes Relative: 17 %
Lymphs Abs: 1.9 10*3/uL (ref 0.7–4.0)
MCH: 33.9 pg (ref 26.0–34.0)
MCHC: 31.2 g/dL (ref 30.0–36.0)
MCV: 108.6 fL — ABNORMAL HIGH (ref 80.0–100.0)
Monocytes Absolute: 1.9 10*3/uL — ABNORMAL HIGH (ref 0.1–1.0)
Monocytes Relative: 17 %
Myelocytes: 3 %
Neutro Abs: 7.2 10*3/uL (ref 1.7–7.7)
Neutrophils Relative %: 62 %
Platelets: 188 10*3/uL (ref 150–400)
RBC: 2.92 MIL/uL — ABNORMAL LOW (ref 4.22–5.81)
RDW: 17.2 % — ABNORMAL HIGH (ref 11.5–15.5)
Smear Review: ADEQUATE
WBC: 11.4 10*3/uL — ABNORMAL HIGH (ref 4.0–10.5)
nRBC: 0 % (ref 0.0–0.2)

## 2019-04-29 LAB — BASIC METABOLIC PANEL
Anion gap: 8 (ref 5–15)
BUN: 32 mg/dL — ABNORMAL HIGH (ref 8–23)
CO2: 23 mmol/L (ref 22–32)
Calcium: 9 mg/dL (ref 8.9–10.3)
Chloride: 107 mmol/L (ref 98–111)
Creatinine, Ser: 1.52 mg/dL — ABNORMAL HIGH (ref 0.61–1.24)
GFR calc Af Amer: 47 mL/min — ABNORMAL LOW (ref >60)
GFR calc non Af Amer: 41 mL/min — ABNORMAL LOW (ref >60)
Glucose, Bld: 132 mg/dL — ABNORMAL HIGH (ref 70–99)
Potassium: 4.2 mmol/L (ref 3.5–5.1)
Sodium: 138 mmol/L (ref 135–145)

## 2019-04-29 LAB — LACTATE DEHYDROGENASE: LDH: 180 U/L (ref 98–192)

## 2019-04-29 MED ORDER — DARBEPOETIN ALFA 300 MCG/0.6ML IJ SOSY
300.0000 ug | PREFILLED_SYRINGE | Freq: Once | INTRAMUSCULAR | Status: AC
  Administered 2019-04-29: 300 ug via SUBCUTANEOUS
  Filled 2019-04-29: qty 0.6

## 2019-04-29 NOTE — Assessment & Plan Note (Addendum)
#  Anemia-mild.  Since 2012.  Suspect MDS/chronic kidney disease.  No bone marrow biopsy.  STABLE.   #Hemoglobin 9.9; proceed with Aranesp today; continue p.o. iron.  #Mild intermittent thrombocytopenia-question ITP; recently 38s today.  Stable at  165.   #CKD stage III-GFR stable;   #Left upper lobe cystic lesion-incidentally found on imaging with [facility]. Will order at next visit.   #Disposition: #  Aranesp today # in 2 weeks- cbc aranesp # in 4 weeks MD- cbc/bmp/LDH-possible aranesp-Dr.B

## 2019-04-29 NOTE — Progress Notes (Signed)
[place] NOTE  Patient Care Team: Rusty Aus, MD as PCP - General (Internal Medicine) Alisa Graff, FNP as Nurse Practitioner (Family Medicine) Ubaldo Glassing Javier Docker, MD as Consulting Physician (Cardiology) Erby Pian, MD as Referring Physician (Specialist)  CHIEF COMPLAINTS/PURPOSE OF CONSULTATION:   # 2012- Anemia 11-12- sec to CKD/IDA? MDS [no BMBx] [March 2017-sat-11%; ferritin-42] [Feb 2017- EGD/colo-July 2015 Dr.Skulskie];April 2017-PO Iron PO; May 2020-IV iron/Aranesp.  # Intermittent thrombocytopenia 120s-140s [Dec 2016-CT- neg cirrhosis/splenomegaly]  #June 2020 -LUL cystic lesion- 4x4cm ?  Question etiology [slightly increased from 2017]; question postinflammatory versus low-grade adenocarcinoma-repeat scan in 6 months [December 2020]  # CKD [creatinine 1.2- 1.4]; CAD/CHF  HISTORY OF PRESENTING ILLNESS:  Andres Gutierrez 85 y.o.  male history of above history of chronic anemia and intermittent thrombocytopenia is here for follow-up/ currently on IV iron and also Aranesp.  Patient has not had any recent hospitalizations since the last visit.  Denies any new onset abdominal pain.  Denies any easy bruising or bleeding.  Chronic mild fatigue; not any worse.  Review of Systems  Constitutional: Positive for malaise/fatigue. Negative for chills, diaphoresis, fever and weight loss.  HENT: Negative for nosebleeds and sore throat.   Eyes: Negative for double vision.  Respiratory: Negative for cough, hemoptysis, sputum production, shortness of breath and wheezing.   Cardiovascular: Negative for chest pain, palpitations, orthopnea and leg swelling.  Gastrointestinal: Negative for abdominal pain, blood in stool, constipation, diarrhea, heartburn, melena, nausea and vomiting.  Genitourinary: Negative for dysuria, frequency and urgency.  Musculoskeletal: Positive for back pain and joint pain.  Skin: Negative.  Negative for itching and rash.   Neurological: Negative for dizziness, tingling, focal weakness, weakness and headaches.  Endo/Heme/Allergies: Does not bruise/bleed easily.  Psychiatric/Behavioral: Negative for depression. The patient is not nervous/anxious and does not have insomnia.      MEDICAL HISTORY:  Past Medical History:  Diagnosis Date  . Anemia   . Anxiety   . Atrial fibrillation ([place])   . Cardiomyopathy ([place])   . CHF (congestive heart failure) ([place])   . Chronic kidney disease    kidney stones  . Coronary artery disease   . Cough   . Dysrhythmia   . Hypertension   . Lymphadenopathy, hilar   . MI (myocardial infarction) ([place])    x 2  . Wheezing     SURGICAL HISTORY: Past Surgical History:  Procedure Laterality Date  . BRONCHIAL NEEDLE ASPIRATION BIOPSY N/A 12/31/2014   Procedure: BRONCHIAL NEEDLE ASPIRATION BIOPSIES from carina;  Surgeon: Flora Lipps, MD;  Location: [facility];  Service: Cardiopulmonary;  Laterality: N/A;  . CARDIAC CATHETERIZATION N/A 12/28/2015   Procedure: Left Heart Cath and Cors/Grafts Angiography;  Surgeon: Yolonda Kida, MD;  Location: [place] [facility];  Service: Cardiovascular;  Laterality: N/A;  . CATARACT EXTRACTION W/PHACO Left 08/01/2016   Procedure: CATARACT EXTRACTION PHACO AND INTRAOCULAR LENS PLACEMENT ([place]) Left;  Surgeon: Leandrew Koyanagi, MD;  Location: [place];  Service: Ophthalmology;  Laterality: Left;  . CORONARY ANGIOPLASTY WITH STENT PLACEMENT    . CORONARY ARTERY BYPASS GRAFT    . ENDOBRONCHIAL ULTRASOUND N/A 12/31/2014   Procedure: ENDOBRONCHIAL ULTRASOUND;  Surgeon: Flora Lipps, MD;  Location: [facility];  Service: Cardiopulmonary;  Laterality: N/A;  . ESOPHAGOGASTRODUODENOSCOPY (EGD) WITH PROPOFOL N/A 06/20/2015   Procedure: ESOPHAGOGASTRODUODENOSCOPY (EGD) WITH PROPOFOL;  Surgeon: Lollie Sails, MD;  Location: [facility] END[facility];  Service: Endoscopy;  Laterality: N/A;    SOCIAL HISTORY: lives in [place];  alone. 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   Social History   Socioeconomic History  . Marital status: Widowed    Spouse name: Not on file  . Number of children: 2  . Years of education: college  . Highest education level: Some college, no degree  Occupational History  . Occupation: retired  Tobacco Use  . Smoking status: Former Smoker    Quit date: 06/27/1977    Years since quitting: 41.8  . Smokeless tobacco: Current User    Types: Chew  Substance and Sexual Activity  . Alcohol use: Yes    Comment: occational almost rare once a year  . Drug use: No  . Sexual activity: Never    Birth control/protection: Abstinence  Other Topics Concern  . Not on file  Social History Narrative  . Not on file   Social Determinants of Health   Financial Resource Strain:   . Difficulty of Paying Living Expenses: Not on file  Food Insecurity:   . Worried About Charity fundraiser in the Last Year: Not on file  . Ran Out of Food in the Last Year: Not on file  Transportation Needs:   . Lack of Transportation (Medical): Not on file  . Lack of Transportation (Non-Medical): Not on file  Physical Activity:   . Days of Exercise per Week: Not on file  . Minutes of Exercise per Session: Not on file  Stress:   . Feeling of Stress : Not on file  Social Connections:   . Frequency of Communication with Friends and Family: Not on file  . Frequency of Social Gatherings with Friends and Family: Not on file  . Attends Religious Services: Not on file  . Active Member of Clubs or Organizations: Not on file  . Attends Archivist Meetings: Not on file  . Marital Status: Not on file  Intimate Partner Violence:   . Fear of Current or Ex-Partner: Not on file  . Emotionally Abused: Not on file  . Physically Abused: Not on file  . Sexually Abused: Not on file    FAMILY HISTORY: Family History  Problem Relation Age of Onset  . CAD Mother   . Colon cancer Father     ALLERGIES:  is allergic to 5ht3 receptor antagonists; diphenhydramine  hcl; escitalopram; maxidex [dexamethasone]; prednisone; serotonin; vytorin [ezetimibe-simvastatin]; zocor [simvastatin]; and diltiazem.  MEDICATIONS:  Current Outpatient Medications  Medication Sig Dispense Refill  . acetaminophen (TYLENOL) 500 MG tablet Take 500 mg by mouth every 6 (six) hours as needed for mild pain or moderate pain.    Marland Kitchen atorvastatin (LIPITOR) 40 MG tablet Take 1 tablet (40 mg total) by mouth daily at 6 PM. 30 tablet 0  . digoxin (LANOXIN) 0.125 MG tablet Take 0.0625 mg by mouth daily.    Marland Kitchen docusate sodium (COLACE) 100 MG capsule Take 100 mg by mouth daily as needed.     . ferrous sulfate 325 (65 FE) MG tablet Take 1 tablet (325 mg total) by mouth every other day. 30 tablet 3  . furosemide (LASIX) 20 MG tablet Take 40 mg by mouth as needed. For weight gain greater than 2 pounds in 24 hours or greater than 5 pounds in one week    . metoprolol tartrate (LOPRESSOR) 25 MG tablet Take 0.5 tablets (12.5 mg total) by mouth 2 (two) times daily. 30 tablet 0  . nitroGLYCERIN (NITROSTAT) 0.4 MG SL tablet Place 0.4 mg under the tongue every 5 (five) minutes as needed for chest pain.    Marland Kitchen  oxyCODONE (ROXICODONE) 5 MG immediate release tablet Take 0.5 tablets (2.5 mg total) by mouth every 6 (six) hours as needed for severe pain. 8 tablet 0  . pantoprazole (PROTONIX) 40 MG tablet Take 40 mg by mouth 2 (two) times daily before a meal.     . polyethylene glycol (MIRALAX / GLYCOLAX) 17 g packet Take 17 g by mouth daily as needed for mild constipation. 14 each 0  . ranolazine (RANEXA) 1000 MG SR tablet Take 1 tablet by mouth daily.    . sucralfate (CARAFATE) 1 G tablet Take 1 g by mouth 4 (four) times daily.     . tamsulosin (FLOMAX) 0.4 MG CAPS capsule Take 0.4 mg by mouth daily after supper.     . vitamin C (VITAMIN C) 250 MG tablet Take 1 tablet (250 mg total) by mouth daily. 30 tablet 0   No current facility-administered medications for this visit.     PHYSICAL EXAMINATION: ECOG  PERFORMANCE STATUS: 0 - Asymptomatic  Vitals:   04/29/19 1416  BP: (!) 165/53  Pulse: 64  Temp: 98.5 F (36.9 C)   Filed Weights   04/29/19 1416  Weight: 211 lb (95.7 kg)    Physical Exam  Constitutional: He is oriented to person, place, and time.  Elderly Caucasian male patient. He is alone.  Walking by with a walker.  HENT:  Head: Normocephalic and atraumatic.  Mouth/Throat: Oropharynx is clear and moist. No oropharyngeal exudate.  Eyes: Pupils are equal, round, and reactive to light.  Cardiovascular: Normal rate and regular rhythm.  Pulmonary/Chest: Breath sounds normal. No respiratory distress. He has no wheezes.  Abdominal: Soft. Bowel sounds are normal. He exhibits no distension and no mass. There is no abdominal tenderness. There is no rebound and no guarding.  Musculoskeletal:        General: No tenderness or edema. Normal range of motion.     Cervical back: Normal range of motion and neck supple.  Neurological: He is alert and oriented to person, place, and time.  Skin: Skin is warm.  Psychiatric: Affect normal.   .   LABORATORY DATA:  I have reviewed the data as listed Lab Results  Component Value Date   WBC 11.4 (H) 04/29/2019   HGB 9.9 (L) 04/29/2019   HCT 31.7 (L) 04/29/2019   MCV 108.6 (H) 04/29/2019   PLT 188 04/29/2019   Recent Labs    01/07/19 0711 03/26/19 1501 04/01/19 1346 04/29/19 1403  NA 140 138 139 138  K 4.3 3.9 3.8 4.2  CL 109 105 107 107  CO2 21* 22 26 23   GLUCOSE 99 114* 115* 132*  BUN 24* 31* 30* 32*  CREATININE 1.51* 1.40* 1.50* 1.52*  CALCIUM 8.8* 9.1 8.6* 9.0  GFRNONAA 42* 45* 42* 41*  GFRAA 48* 53* 49* 47*  PROT 7.8 7.6 7.8  --   ALBUMIN 3.8 4.3 4.5  --   AST 21 20 19   --   ALT 15 20 22   --   ALKPHOS 59 59 59  --   BILITOT 0.9 1.2 1.0  --   BILIDIR 0.2  --   --   --   IBILI 0.7  --   --   --    ASSESSMENT & PLAN:   Anemia of chronic kidney failure, stage 3 (moderate) ([place]) # Anemia-mild.  Since 2012.  Suspect  MDS/chronic kidney disease.  No bone marrow biopsy.  STABLE.   #Hemoglobin 9.9; proceed with Aranesp today; continue p.o. iron.  #Mild  intermittent thrombocytopenia-question ITP; recently 82s today.  Stable at  165.   #CKD stage III-GFR stable;   #Left upper lobe cystic lesion-incidentally found on imaging with [facility]. Will order at next visit.   #Disposition: #  Aranesp today # in 2 weeks- cbc aranesp # in 4 weeks MD- cbc/bmp/LDH-possible aranesp-Dr.B      Cammie Sickle, MD 04/29/2019 4:03 PM

## 2019-05-04 DIAGNOSIS — L97512 Non-pressure chronic ulcer of other part of right foot with fat layer exposed: Secondary | ICD-10-CM | POA: Diagnosis not present

## 2019-05-04 DIAGNOSIS — L97521 Non-pressure chronic ulcer of other part of left foot limited to breakdown of skin: Secondary | ICD-10-CM | POA: Diagnosis not present

## 2019-05-12 ENCOUNTER — Other Ambulatory Visit: Payer: Self-pay | Admitting: *Deleted

## 2019-05-12 ENCOUNTER — Other Ambulatory Visit: Payer: Self-pay

## 2019-05-12 DIAGNOSIS — N183 Chronic kidney disease, stage 3 unspecified: Secondary | ICD-10-CM

## 2019-05-12 DIAGNOSIS — D631 Anemia in chronic kidney disease: Secondary | ICD-10-CM

## 2019-05-13 ENCOUNTER — Inpatient Hospital Stay: Payer: PPO

## 2019-05-13 ENCOUNTER — Other Ambulatory Visit: Payer: Self-pay

## 2019-05-13 DIAGNOSIS — D631 Anemia in chronic kidney disease: Secondary | ICD-10-CM

## 2019-05-13 DIAGNOSIS — I130 Hypertensive heart and chronic kidney disease with heart failure and stage 1 through stage 4 chronic kidney disease, or unspecified chronic kidney disease: Secondary | ICD-10-CM | POA: Diagnosis not present

## 2019-05-13 LAB — CBC WITH DIFFERENTIAL/PLATELET
Abs Immature Granulocytes: 0.3 10*3/uL — ABNORMAL HIGH (ref 0.00–0.07)
Band Neutrophils: 3 %
Basophils Absolute: 0 10*3/uL (ref 0.0–0.1)
Basophils Relative: 0 %
Eosinophils Absolute: 0 10*3/uL (ref 0.0–0.5)
Eosinophils Relative: 0 %
HCT: 36.6 % — ABNORMAL LOW (ref 39.0–52.0)
Hemoglobin: 11.3 g/dL — ABNORMAL LOW (ref 13.0–17.0)
Lymphocytes Relative: 17 %
Lymphs Abs: 2 10*3/uL (ref 0.7–4.0)
MCH: 34.1 pg — ABNORMAL HIGH (ref 26.0–34.0)
MCHC: 30.9 g/dL (ref 30.0–36.0)
MCV: 110.6 fL — ABNORMAL HIGH (ref 80.0–100.0)
Metamyelocytes Relative: 1 %
Monocytes Absolute: 2.6 10*3/uL — ABNORMAL HIGH (ref 0.1–1.0)
Monocytes Relative: 22 %
Myelocytes: 2 %
Neutro Abs: 6.7 10*3/uL (ref 1.7–7.7)
Neutrophils Relative %: 55 %
Platelets: 150 10*3/uL (ref 150–400)
RBC: 3.31 MIL/uL — ABNORMAL LOW (ref 4.22–5.81)
RDW: 17.9 % — ABNORMAL HIGH (ref 11.5–15.5)
Smear Review: ADEQUATE
WBC: 11.6 10*3/uL — ABNORMAL HIGH (ref 4.0–10.5)
nRBC: 0 % (ref 0.0–0.2)

## 2019-05-18 ENCOUNTER — Emergency Department: Payer: PPO

## 2019-05-18 ENCOUNTER — Emergency Department
Admission: EM | Admit: 2019-05-18 | Discharge: 2019-05-18 | Disposition: A | Discharge status: Home / Self Care | Payer: PPO | Attending: Emergency Medicine | Admitting: Emergency Medicine

## 2019-05-18 ENCOUNTER — Other Ambulatory Visit: Payer: Self-pay

## 2019-05-18 ENCOUNTER — Encounter: Payer: Self-pay | Admitting: Medical Oncology

## 2019-05-18 DIAGNOSIS — Z79899 Other long term (current) drug therapy: Secondary | ICD-10-CM | POA: Diagnosis not present

## 2019-05-18 DIAGNOSIS — I5022 Chronic systolic (congestive) heart failure: Secondary | ICD-10-CM | POA: Insufficient documentation

## 2019-05-18 DIAGNOSIS — R918 Other nonspecific abnormal finding of lung field: Secondary | ICD-10-CM | POA: Diagnosis not present

## 2019-05-18 DIAGNOSIS — R252 Cramp and spasm: Secondary | ICD-10-CM | POA: Insufficient documentation

## 2019-05-18 DIAGNOSIS — Z87891 Personal history of nicotine dependence: Secondary | ICD-10-CM | POA: Insufficient documentation

## 2019-05-18 DIAGNOSIS — N183 Chronic kidney disease, stage 3 unspecified: Secondary | ICD-10-CM | POA: Diagnosis not present

## 2019-05-18 DIAGNOSIS — R200 Anesthesia of skin: Secondary | ICD-10-CM | POA: Diagnosis present

## 2019-05-18 DIAGNOSIS — I130 Hypertensive heart and chronic kidney disease with heart failure and stage 1 through stage 4 chronic kidney disease, or unspecified chronic kidney disease: Secondary | ICD-10-CM | POA: Diagnosis not present

## 2019-05-18 DIAGNOSIS — I2510 Atherosclerotic heart disease of native coronary artery without angina pectoris: Secondary | ICD-10-CM | POA: Diagnosis not present

## 2019-05-18 LAB — CBC
HCT: 33 % — ABNORMAL LOW (ref 39.0–52.0)
Hemoglobin: 11.2 g/dL — ABNORMAL LOW (ref 13.0–17.0)
MCH: 34.3 pg — ABNORMAL HIGH (ref 26.0–34.0)
MCHC: 33.9 g/dL (ref 30.0–36.0)
MCV: 100.9 fL — ABNORMAL HIGH (ref 80.0–100.0)
Platelets: 120 10*3/uL — ABNORMAL LOW (ref 150–400)
RBC: 3.27 MIL/uL — ABNORMAL LOW (ref 4.22–5.81)
RDW: 17.5 % — ABNORMAL HIGH (ref 11.5–15.5)
WBC: 10.7 10*3/uL — ABNORMAL HIGH (ref 4.0–10.5)
nRBC: 0 % (ref 0.0–0.2)

## 2019-05-18 LAB — BASIC METABOLIC PANEL
Anion gap: 11 (ref 5–15)
BUN: 32 mg/dL — ABNORMAL HIGH (ref 8–23)
CO2: 23 mmol/L (ref 22–32)
Calcium: 8.9 mg/dL (ref 8.9–10.3)
Chloride: 102 mmol/L (ref 98–111)
Creatinine, Ser: 1.41 mg/dL — ABNORMAL HIGH (ref 0.61–1.24)
GFR calc Af Amer: 52 mL/min — ABNORMAL LOW (ref >60)
GFR calc non Af Amer: 45 mL/min — ABNORMAL LOW (ref >60)
Glucose, Bld: 121 mg/dL — ABNORMAL HIGH (ref 70–99)
Potassium: 4.4 mmol/L (ref 3.5–5.1)
Sodium: 136 mmol/L (ref 135–145)

## 2019-05-18 NOTE — ED Provider Notes (Signed)
[facility] Emergency Department Provider Note ____________________________________________   First MD Initiated Contact with Patient 05/18/19 1011     (approximate)  I have reviewed the triage vital signs and the nursing notes.   HISTORY  Chief Complaint Numbness    HPI YOHANN CURL is a 86 y.o. male with PMH as noted below who presents with pain to both of his legs, acute onset this morning after he got up from bed and walked around a little bit, described as a cramp, and now improved.  He states that it was bilateral but slightly worse on the right.  He states he has had similar symptoms before when he has been dehydrated, but he has been trying to drink 40 ounces water daily more recently.  He states that when the cramps started he felt like the legs were somewhat numb or tingly, but denies any weakness and has not had any difficulty walking compared to baseline.  He denies any trauma or injury.  He has not had swelling, any rash, or skin lesions.  He denies associated back pain.  He did not feel lightheaded or near syncopal.  Past Medical History:  Diagnosis Date  . Anemia   . Anxiety   . Atrial fibrillation ([place])   . Cardiomyopathy ([place])   . CHF (congestive heart failure) ([place])   . Chronic kidney disease    kidney stones  . Coronary artery disease   . Cough   . Dysrhythmia   . Hypertension   . Lymphadenopathy, hilar   . MI (myocardial infarction) ([place])    x 2  . Wheezing     Patient Active Problem List   Diagnosis Date Noted  . Orthostatic hypotension 01/07/2019  . Macrocytic anemia 08/05/2018  . Anemia of chronic kidney failure, stage 3 (moderate) 08/05/2018  . BPH (benign prostatic hyperplasia) 07/24/2018  . Gastroesophageal reflux disease with esophagitis 07/24/2018  . Stage 3 chronic kidney disease 07/24/2018  . Thrombocytopenia ([place]) 07/24/2018  . Primary osteoarthritis of both knees 08/22/2017  . Chronic systolic heart failure  ([place]) 01/17/2016  . Chewing tobacco use 01/17/2016  . Atrial fibrillation with RVR ([place]) 12/23/2015  . Dyspnea on exertion 12/23/2015  . Elevated troponin 12/23/2015  . GERD (gastroesophageal reflux disease) 12/23/2015  . Iron deficiency anemia due to chronic blood loss 12/20/2015  . Femoral neck fracture, left, closed, initial encounter 11/03/2015  . Chronic atrial fibrillation ([place])   . Coronary artery disease involving native coronary artery of native heart without angina pectoris   . Acalculous cholecystitis 10/29/2015  . Adenopathy   . CAD (coronary artery disease) 12/22/2014  . HTN (hypertension) 12/22/2014    Past Surgical History:  Procedure Laterality Date  . BRONCHIAL NEEDLE ASPIRATION BIOPSY N/A 12/31/2014   Procedure: BRONCHIAL NEEDLE ASPIRATION BIOPSIES from carina;  Surgeon: Flora Lipps, MD;  Location: [facility] ORS;  Service: Cardiopulmonary;  Laterality: N/A;  . CARDIAC CATHETERIZATION N/A 12/28/2015   Procedure: Left Heart Cath and Cors/Grafts Angiography;  Surgeon: Yolonda Kida, MD;  Location: [facility];  Service: Cardiovascular;  Laterality: N/A;  . CATARACT EXTRACTION W/PHACO Left 08/01/2016   Procedure: CATARACT EXTRACTION PHACO AND INTRAOCULAR LENS PLACEMENT ([place]) Left;  Surgeon: Leandrew Koyanagi, MD;  Location: [facility];  Service: Ophthalmology;  Laterality: Left;  . CORONARY ANGIOPLASTY WITH STENT PLACEMENT    . CORONARY ARTERY BYPASS GRAFT    . ENDOBRONCHIAL ULTRASOUND N/A 12/31/2014   Procedure: ENDOBRONCHIAL ULTRASOUND;  Surgeon: Flora Lipps, MD;  Location:  [facility] ORS;  Service: Cardiopulmonary;  Laterality: N/A;  . ESOPHAGOGASTRODUODENOSCOPY (EGD) WITH PROPOFOL N/A 06/20/2015   Procedure: ESOPHAGOGASTRODUODENOSCOPY (EGD) WITH PROPOFOL;  Surgeon: Lollie Sails, MD;  Location: [facility] ENDOSCOPY;  Service: Endoscopy;  Laterality: N/A;    Prior to Admission medications   Medication Sig Start Date End Date Taking? Authorizing Provider    acetaminophen (TYLENOL) 500 MG tablet Take 500 mg by mouth every 6 (six) hours as needed for mild pain or moderate pain.    [provider]  atorvastatin (LIPITOR) 40 MG tablet Take 1 tablet (40 mg total) by mouth daily at 6 PM. 12/30/15   Dustin Flock, MD  digoxin (LANOXIN) 0.125 MG tablet Take 0.0625 mg by mouth daily.    [provider]  docusate sodium (COLACE) 100 MG capsule Take 100 mg by mouth daily as needed.     [provider]  ferrous sulfate 325 (65 FE) MG tablet Take 1 tablet (325 mg total) by mouth every other day. 03/04/19   Verlon Au, NP  furosemide (LASIX) 20 MG tablet Take 40 mg by mouth as needed. For weight gain greater than 2 pounds in 24 hours or greater than 5 pounds in one week    [provider]  metoprolol tartrate (LOPRESSOR) 25 MG tablet Take 0.5 tablets (12.5 mg total) by mouth 2 (two) times daily. 01/09/19   Nicholes Mango, MD  nitroGLYCERIN (NITROSTAT) 0.4 MG SL tablet Place 0.4 mg under the tongue every 5 (five) minutes as needed for chest pain.    [provider]  oxyCODONE (ROXICODONE) 5 MG immediate release tablet Take 0.5 tablets (2.5 mg total) by mouth every 6 (six) hours as needed for severe pain. 03/26/19 03/25/20  Duffy Bruce, MD  pantoprazole (PROTONIX) 40 MG tablet Take 40 mg by mouth 2 (two) times daily before a meal.     [provider]  polyethylene glycol (MIRALAX / GLYCOLAX) 17 g packet Take 17 g by mouth daily as needed for mild constipation. 01/09/19   Nicholes Mango, MD  ranolazine (RANEXA) 1000 MG SR tablet Take 1 tablet by mouth daily. 01/16/19   [provider]  sucralfate (CARAFATE) 1 G tablet Take 1 g by mouth 4 (four) times daily.     [provider]  tamsulosin (FLOMAX) 0.4 MG CAPS capsule Take 0.4 mg by mouth daily after supper.     [provider]  vitamin C (VITAMIN C) 250 MG tablet Take 1 tablet (250 mg total) by mouth daily. 11/01/15   Dustin Flock, MD     Allergies 5ht3 receptor antagonists, Diphenhydramine hcl, Escitalopram, Maxidex [dexamethasone], Prednisone, Serotonin, Vytorin [ezetimibe-simvastatin], Zocor [simvastatin], and Diltiazem  Family History  Problem Relation Age of Onset  . CAD Mother   . Colon cancer Father     Social History Social History   Tobacco Use  . Smoking status: Former Smoker    Quit date: 06/27/1977    Years since quitting: 41.9  . Smokeless tobacco: Current User    Types: Chew  Substance Use Topics  . Alcohol use: Yes    Comment: occational almost rare once a year  . Drug use: No    Review of Systems  Constitutional: No fever. Eyes: No redness. ENT: No neck pain. Cardiovascular: Denies chest pain or palpitations. Respiratory: Denies shortness of breath. Gastrointestinal: No abdominal pain. Genitourinary: Negative for dysuria.  Musculoskeletal: Negative for back pain.  Positive bilateral leg pain. Skin: Negative for rash. Neurological: Negative for focal weakness.  ____________________________________________   PHYSICAL EXAM:  VITAL SIGNS: ED Triage Vitals  Enc Vitals Group     BP 05/18/19 0859 (!) 156/65     Pulse Rate 05/18/19 0859 79     Resp 05/18/19 0859 18     Temp 05/18/19 0859 98.7 F (37.1 C)     Temp Source 05/18/19 0859 Oral     SpO2 05/18/19 0859 97 %     Weight 05/18/19 0900 209 lb 7 oz (95 kg)     Height --      Head Circumference --      Peak Flow --      Pain Score 05/18/19 0900 3     Pain Loc --      Pain Edu? --      Excl. in [place]? --     Constitutional: Alert and oriented.  Very well-appearing for age and in no acute distress. Eyes: Conjunctivae are normal.  Head: Atraumatic. Nose: No congestion/rhinnorhea. Mouth/Throat: Mucous membranes are moist.   Neck: Normal range of motion.  Cardiovascular: Normal rate, regular rhythm.   Good peripheral circulation. Respiratory: Normal respiratory effort.  No retractions.  Gastrointestinal:  No distention.   Genitourinary: No CVA tenderness.   Musculoskeletal: No lower extremity edema.  No calf, popliteal, or proximal leg swelling or tenderness.  Extremities warm and well perfused.  No midline spinal tenderness.  Full range of motion to bilateral lower extremities. Neurologic:  Normal speech and language.  5/5 motor strength and intact sensation of bilateral lower extremities.   Skin:  Skin is warm and dry. No rash noted. Psychiatric: Mood and affect are normal. Speech and behavior are normal.  ____________________________________________   LABS (all labs ordered are listed, but only abnormal results are displayed)  Labs Reviewed  BASIC METABOLIC PANEL - Abnormal; Notable for the following components:      Result Value   Glucose, Bld 121 (*)    BUN 32 (*)    Creatinine, Ser 1.41 (*)    GFR calc non Af Amer 45 (*)    GFR calc Af Amer 52 (*)    All other components within normal limits  CBC - Abnormal; Notable for the following components:   WBC 10.7 (*)    RBC 3.27 (*)    Hemoglobin 11.2 (*)    HCT 33.0 (*)    MCV 100.9 (*)    MCH 34.3 (*)    RDW 17.5 (*)    Platelets 120 (*)    All other components within normal limits  URINALYSIS, COMPLETE (UACMP) WITH MICROSCOPIC   ____________________________________________  EKG   ____________________________________________  RADIOLOGY  CXR: Chronic bullous emphysema and reticular opacities with no acute findings  ____________________________________________   PROCEDURES  Procedure(s) performed: No  Procedures  Critical Care performed: No ____________________________________________   INITIAL IMPRESSION / ASSESSMENT AND PLAN / ED COURSE  Pertinent labs & imaging results that were available during my care of the patient were reviewed by me and considered in my medical decision making (see chart for details).  86 year old male with PMH as noted above presents with acute onset of bilateral leg cramping this morning which  has now mostly subsided.  He reports some subjective numbness, but this has resolved as well.  In addition he felt like he had a metallic taste to his mouth that has resolved.  He denies any weakness, and states that he has been able to ambulate without difficulty.  He has no significant associated symptoms.  I reviewed the  past medical records in [place].  The patient was most recently seen in the ED in November with bilateral leg pain and also with abdominal pain at that time.  His work-up was negative other than chronic thrombocytopenia.  On exam today, the patient is very well-appearing.  His vital signs are normal except for mild hypertension.  The remainder of the exam is unremarkable.  He has no midline spinal tenderness, full range of motion of bilateral hips, knees, and ankles, no swelling or tenderness, and strong distal pulses.  He has no focal neurologic findings.  Overall presentation is consistent with benign muscle cramps or spasm.  Lab work-up was obtained and is consistent with patient's baseline.  Clinically and by lab work-up, there is no evidence of dehydration or any electrolyte abnormality that would precipitate cramping.  The patient feels well and is comfortable going home.  At this time there is no indication for further ED work-up.  I counseled him on the results of the labs, the plan of care, and return precautions and he expressed understanding.  He is following up with his doctor in a few days.  ----------------------------------------- 11:28 AM on 05/18/2019 -----------------------------------------  The RN spoke to the daughter on the phone.  She states that the patient reported shortness of breath to her earlier and she is concerned that he is minimizing his symptoms.  The patient denies any shortness of breath now, but based on discussion with the patient and daughter we will obtain a chest x-ray to further evaluate.  ----------------------------------------- 12:07 PM  on 05/18/2019 -----------------------------------------  Chest x-ray shows no acute findings and the patient continues to be asymptomatic.  He is stable for discharge home.  ____________________________________________   FINAL CLINICAL IMPRESSION(S) / ED DIAGNOSES  Final diagnoses:  Leg cramps      NEW MEDICATIONS STARTED DURING THIS VISIT:  New Prescriptions   No medications on file     Note:  This document was prepared using Dragon voice recognition software and may include unintentional dictation errors.    Arta Silence, MD 05/18/19 1207

## 2019-05-18 NOTE — ED Notes (Signed)
Patient reports numbness in legs has resolved. Also reports iron in mouth taste not as strong as when he first arrived.

## 2019-05-18 NOTE — Discharge Instructions (Addendum)
Follow-up with your doctor on Wednesday as planned.  Return to the ER immediately for new, worsening, or persistent severe leg cramps or pain, weakness or numbness, difficulty walking, swelling, fever, or any other new or worsening symptoms that concern you.

## 2019-05-18 NOTE — ED Triage Notes (Signed)
Pt reports this am while he was walking to the bathroom both of his legs started to feel weak and numb, states that they are painful also. Pt ambulatory with walker at baseline. Pt A/O x 4, no speech difficulties noted.

## 2019-05-25 DIAGNOSIS — L97512 Non-pressure chronic ulcer of other part of right foot with fat layer exposed: Secondary | ICD-10-CM | POA: Diagnosis not present

## 2019-05-25 DIAGNOSIS — L97521 Non-pressure chronic ulcer of other part of left foot limited to breakdown of skin: Secondary | ICD-10-CM | POA: Diagnosis not present

## 2019-05-26 NOTE — Progress Notes (Signed)
Called and left voice mail to call back

## 2019-05-27 ENCOUNTER — Encounter: Payer: Self-pay | Admitting: Internal Medicine

## 2019-05-27 ENCOUNTER — Inpatient Hospital Stay: Payer: PPO

## 2019-05-27 ENCOUNTER — Other Ambulatory Visit: Payer: Self-pay

## 2019-05-27 ENCOUNTER — Inpatient Hospital Stay (HOSPITAL_BASED_OUTPATIENT_CLINIC_OR_DEPARTMENT_OTHER): Payer: PPO | Admitting: Internal Medicine

## 2019-05-27 ENCOUNTER — Inpatient Hospital Stay: Payer: PPO | Attending: Internal Medicine

## 2019-05-27 DIAGNOSIS — D649 Anemia, unspecified: Secondary | ICD-10-CM | POA: Insufficient documentation

## 2019-05-27 DIAGNOSIS — R918 Other nonspecific abnormal finding of lung field: Secondary | ICD-10-CM | POA: Diagnosis not present

## 2019-05-27 DIAGNOSIS — Z79899 Other long term (current) drug therapy: Secondary | ICD-10-CM | POA: Insufficient documentation

## 2019-05-27 DIAGNOSIS — I252 Old myocardial infarction: Secondary | ICD-10-CM | POA: Insufficient documentation

## 2019-05-27 DIAGNOSIS — I4891 Unspecified atrial fibrillation: Secondary | ICD-10-CM | POA: Diagnosis not present

## 2019-05-27 DIAGNOSIS — D631 Anemia in chronic kidney disease: Secondary | ICD-10-CM

## 2019-05-27 DIAGNOSIS — I509 Heart failure, unspecified: Secondary | ICD-10-CM | POA: Diagnosis not present

## 2019-05-27 DIAGNOSIS — N183 Chronic kidney disease, stage 3 unspecified: Secondary | ICD-10-CM

## 2019-05-27 DIAGNOSIS — F419 Anxiety disorder, unspecified: Secondary | ICD-10-CM | POA: Diagnosis not present

## 2019-05-27 DIAGNOSIS — I129 Hypertensive chronic kidney disease with stage 1 through stage 4 chronic kidney disease, or unspecified chronic kidney disease: Secondary | ICD-10-CM | POA: Diagnosis not present

## 2019-05-27 DIAGNOSIS — I10 Essential (primary) hypertension: Secondary | ICD-10-CM | POA: Insufficient documentation

## 2019-05-27 DIAGNOSIS — Z87891 Personal history of nicotine dependence: Secondary | ICD-10-CM | POA: Insufficient documentation

## 2019-05-27 DIAGNOSIS — I2510 Atherosclerotic heart disease of native coronary artery without angina pectoris: Secondary | ICD-10-CM | POA: Insufficient documentation

## 2019-05-27 LAB — CBC WITH DIFFERENTIAL/PLATELET
Abs Immature Granulocytes: 0.29 10*3/uL — ABNORMAL HIGH (ref 0.00–0.07)
Basophils Absolute: 0 10*3/uL (ref 0.0–0.1)
Basophils Relative: 0 %
Eosinophils Absolute: 0 10*3/uL (ref 0.0–0.5)
Eosinophils Relative: 0 %
HCT: 34.2 % — ABNORMAL LOW (ref 39.0–52.0)
Hemoglobin: 10.8 g/dL — ABNORMAL LOW (ref 13.0–17.0)
Immature Granulocytes: 3 %
Lymphocytes Relative: 19 %
Lymphs Abs: 2.1 10*3/uL (ref 0.7–4.0)
MCH: 33.8 pg (ref 26.0–34.0)
MCHC: 31.6 g/dL (ref 30.0–36.0)
MCV: 106.9 fL — ABNORMAL HIGH (ref 80.0–100.0)
Monocytes Absolute: 2.5 10*3/uL — ABNORMAL HIGH (ref 0.1–1.0)
Monocytes Relative: 22 %
Neutro Abs: 6.5 10*3/uL (ref 1.7–7.7)
Neutrophils Relative %: 56 %
Platelets: 103 10*3/uL — ABNORMAL LOW (ref 150–400)
RBC: 3.2 MIL/uL — ABNORMAL LOW (ref 4.22–5.81)
RDW: 16.4 % — ABNORMAL HIGH (ref 11.5–15.5)
WBC: 11.5 10*3/uL — ABNORMAL HIGH (ref 4.0–10.5)
nRBC: 0 % (ref 0.0–0.2)

## 2019-05-27 LAB — COMPREHENSIVE METABOLIC PANEL
ALT: 20 U/L (ref 0–44)
AST: 18 U/L (ref 15–41)
Albumin: 4.7 g/dL (ref 3.5–5.0)
Alkaline Phosphatase: 51 U/L (ref 38–126)
Anion gap: 10 (ref 5–15)
BUN: 42 mg/dL — ABNORMAL HIGH (ref 8–23)
CO2: 20 mmol/L — ABNORMAL LOW (ref 22–32)
Calcium: 9.3 mg/dL (ref 8.9–10.3)
Chloride: 107 mmol/L (ref 98–111)
Creatinine, Ser: 1.65 mg/dL — ABNORMAL HIGH (ref 0.61–1.24)
GFR calc Af Amer: 43 mL/min — ABNORMAL LOW (ref >60)
GFR calc non Af Amer: 37 mL/min — ABNORMAL LOW (ref >60)
Glucose, Bld: 112 mg/dL — ABNORMAL HIGH (ref 70–99)
Potassium: 4.4 mmol/L (ref 3.5–5.1)
Sodium: 137 mmol/L (ref 135–145)
Total Bilirubin: 1 mg/dL (ref 0.3–1.2)
Total Protein: 7.7 g/dL (ref 6.5–8.1)

## 2019-05-27 LAB — LACTATE DEHYDROGENASE: LDH: 180 U/L (ref 98–192)

## 2019-05-27 NOTE — Assessment & Plan Note (Addendum)
#  Anemia-mild.  Since 2012.  Suspect MDS/chronic kidney disease.  No bone marrow biopsy.  STABLE.   #Hemoglobin 10.3 HOLD Aranesp today; continue p.o. iron.  #Mild intermittent thrombocytopenia-question ITP; recently 80s today.  Stable at 103;  #CKD stage III-GFR stable;   #Left upper lobe cystic lesion-incidentally found on imaging with [facility]. Awaiting [facility] oulmonary evaluation on 1/14th.   # I discussed regarding Covid-19 precautions.  I reviewed the vaccine effectiveness and potential side effects in detail.  Also discussed long-term effectiveness and safety profile are unclear at this time.  I discussed December, 2020 [employer] position statement-that all patients are recommended COVID-19 vaccinations [when available]-as long as they do not have allergy to components of the vaccine.  However, I think the benefits of the vaccination outweigh the potential risks. Re: COVID-19 vaccination.  For more information/scheduling recommend call [place] health department- [phone], 8:30am-4:30pm.    #Disposition: #  HOLD Aranesp today # in 2 weeks- cbc aranesp # in 4 weeks MD- cbc/bmp/LDH-possible aranesp-Dr.B 

## 2019-05-27 NOTE — Progress Notes (Signed)
[place] NOTE  Patient Care Team: Rusty Aus, MD as PCP - General (Internal Medicine) Alisa Graff, FNP as Nurse Practitioner (Family Medicine) Ubaldo Glassing Javier Docker, MD as Consulting Physician (Cardiology) Erby Pian, MD as Referring Physician (Specialist)  CHIEF COMPLAINTS/PURPOSE OF CONSULTATION:   # 2012- Anemia 11-12- sec to CKD/IDA? MDS [no BMBx] [March 2017-sat-11%; ferritin-42] [Feb 2017- EGD/colo-July 2015 Dr.Skulskie];April 2017-PO Iron PO; May 2020-IV iron/Aranesp.  # Intermittent thrombocytopenia 120s-140s [Dec 2016-CT- neg cirrhosis/splenomegaly]  #June 2020 -LUL cystic lesion- 4x4cm ?  Question etiology [slightly increased from 2017]; question postinflammatory versus low-grade adenocarcinoma-repeat scan in 6 months [December 2020]  # CKD [creatinine 1.2- 1.4]; CAD/CHF  HISTORY OF PRESENTING ILLNESS:  Andres Gutierrez 86 y.o.  male history of above history of chronic anemia and intermittent thrombocytopenia is here for follow-up/ currently on IV iron and also Aranesp.  Patient has not had any hospitalizations.  Denies any abdominal pain nausea vomiting.   Chronic mild fatigue; not any worse.  Awaiting to see pulmonology later this week.  Review of Systems  Constitutional: Positive for malaise/fatigue. Negative for chills, diaphoresis, fever and weight loss.  HENT: Negative for nosebleeds and sore throat.   Eyes: Negative for double vision.  Respiratory: Negative for cough, hemoptysis, sputum production, shortness of breath and wheezing.   Cardiovascular: Negative for chest pain, palpitations, orthopnea and leg swelling.  Gastrointestinal: Negative for abdominal pain, blood in stool, constipation, diarrhea, heartburn, melena, nausea and vomiting.  Genitourinary: Negative for dysuria, frequency and urgency.  Musculoskeletal: Positive for back pain and joint pain.  Skin: Negative.  Negative for itching and rash.  Neurological: Negative  for dizziness, tingling, focal weakness, weakness and headaches.  Endo/Heme/Allergies: Does not bruise/bleed easily.  Psychiatric/Behavioral: Negative for depression. The patient is not nervous/anxious and does not have insomnia.      MEDICAL HISTORY:  Past Medical History:  Diagnosis Date  . Anemia   . Anxiety   . Atrial fibrillation ([place])   . Cardiomyopathy ([place])   . CHF (congestive heart failure) ([place])   . Chronic kidney disease    kidney stones  . Coronary artery disease   . Cough   . Dysrhythmia   . Hypertension   . Lymphadenopathy, hilar   . MI (myocardial infarction) ([place])    x 2  . Wheezing     SURGICAL HISTORY: Past Surgical History:  Procedure Laterality Date  . BRONCHIAL NEEDLE ASPIRATION BIOPSY N/A 12/31/2014   Procedure: BRONCHIAL NEEDLE ASPIRATION BIOPSIES from carina;  Surgeon: Flora Lipps, MD;  Location: [facility] OR[facility];  Service: Cardiopulmonary;  Laterality: N/A;  . CARDIAC CATHETERIZATION N/A 12/28/2015   Procedure: Left Heart Cath and Cors/Grafts Angiography;  Surgeon: Yolonda Kida, MD;  Location: [place] [facility];  Service: Cardiovascular;  Laterality: N/A;  . CATARACT EXTRACTION W/PHACO Left 08/01/2016   Procedure: CATARACT EXTRACTION PHACO AND INTRAOCULAR LENS PLACEMENT ([place]) Left;  Surgeon: Leandrew Koyanagi, MD;  Location: [place];  Service: Ophthalmology;  Laterality: Left;  . CORONARY ANGIOPLASTY WITH STENT PLACEMENT    . CORONARY ARTERY BYPASS GRAFT    . ENDOBRONCHIAL ULTRASOUND N/A 12/31/2014   Procedure: ENDOBRONCHIAL ULTRASOUND;  Surgeon: Flora Lipps, MD;  Location: [facility];  Service: Cardiopulmonary;  Laterality: N/A;  . ESOPHAGOGASTRODUODENOSCOPY (EGD) WITH PROPOFOL N/A 06/20/2015   Procedure: ESOPHAGOGASTRODUODENOSCOPY (EGD) WITH PROPOFOL;  Surgeon: Lollie Sails, MD;  Location: [facility] ENDOSCOPY;  Service: Endoscopy;  Laterality: N/A;    SOCIAL HISTORY: lives in [place]; alone. Used to  work in Academic librarian.  Social History    Socioeconomic History  . Marital status: Widowed    Spouse name: Not on file  . Number of children: 2  . Years of education: college  . Highest education level: Some college, no degree  Occupational History  . Occupation: retired  Tobacco Use  . Smoking status: Former Smoker    Quit date: 06/27/1977    Years since quitting: 41.9  . Smokeless tobacco: Current User    Types: Chew  Substance and Sexual Activity  . Alcohol use: Yes    Comment: occational almost rare once a year  . Drug use: No  . Sexual activity: Never    Birth control/protection: Abstinence  Other Topics Concern  . Not on file  Social History Narrative  . Not on file   Social Determinants of Health   Financial Resource Strain:   . Difficulty of Paying Living Expenses: Not on file  Food Insecurity:   . Worried About Charity fundraiser in the Last Year: Not on file  . Ran Out of Food in the Last Year: Not on file  Transportation Needs:   . Lack of Transportation (Medical): Not on file  . Lack of Transportation (Non-Medical): Not on file  Physical Activity:   . Days of Exercise per Week: Not on file  . Minutes of Exercise per Session: Not on file  Stress:   . Feeling of Stress : Not on file  Social Connections:   . Frequency of Communication with Friends and Family: Not on file  . Frequency of Social Gatherings with Friends and Family: Not on file  . Attends Religious Services: Not on file  . Active Member of Clubs or Organizations: Not on file  . Attends Archivist Meetings: Not on file  . Marital Status: Not on file  Intimate Partner Violence:   . Fear of Current or Ex-Partner: Not on file  . Emotionally Abused: Not on file  . Physically Abused: Not on file  . Sexually Abused: Not on file    FAMILY HISTORY: Family History  Problem Relation Age of Onset  . CAD Mother   . Colon cancer Father     ALLERGIES:  is allergic to 5ht3 receptor antagonists; diphenhydramine hcl;  escitalopram; maxidex [dexamethasone]; prednisone; serotonin; vytorin [ezetimibe-simvastatin]; zocor [simvastatin]; and diltiazem.  MEDICATIONS:  Current Outpatient Medications  Medication Sig Dispense Refill  . acetaminophen (TYLENOL) 500 MG tablet Take 500 mg by mouth every 6 (six) hours as needed for mild pain or moderate pain.    Marland Kitchen atorvastatin (LIPITOR) 40 MG tablet Take 1 tablet (40 mg total) by mouth daily at 6 PM. 30 tablet 0  . digoxin (LANOXIN) 0.125 MG tablet Take 0.0625 mg by mouth daily.    Marland Kitchen docusate sodium (COLACE) 100 MG capsule Take 100 mg by mouth daily as needed.     . ferrous sulfate 325 (65 FE) MG tablet Take 1 tablet (325 mg total) by mouth every other day. 30 tablet 3  . furosemide (LASIX) 20 MG tablet Take 40 mg by mouth as needed. For weight gain greater than 2 pounds in 24 hours or greater than 5 pounds in one week    . metoprolol tartrate (LOPRESSOR) 25 MG tablet Take 0.5 tablets (12.5 mg total) by mouth 2 (two) times daily. 30 tablet 0  . nitroGLYCERIN (NITROSTAT) 0.4 MG SL tablet Place 0.4 mg under the tongue every 5 (five) minutes as needed for chest pain.    Marland Kitchen  oxyCODONE (ROXICODONE) 5 MG immediate release tablet Take 0.5 tablets (2.5 mg total) by mouth every 6 (six) hours as needed for severe pain. 8 tablet 0  . pantoprazole (PROTONIX) 40 MG tablet Take 40 mg by mouth 2 (two) times daily before a meal.     . polyethylene glycol (MIRALAX / GLYCOLAX) 17 g packet Take 17 g by mouth daily as needed for mild constipation. 14 each 0  . ranolazine (RANEXA) 1000 MG SR tablet Take 1 tablet by mouth daily.    . sucralfate (CARAFATE) 1 G tablet Take 1 g by mouth 4 (four) times daily.     . tamsulosin (FLOMAX) 0.4 MG CAPS capsule Take 0.4 mg by mouth daily after supper.     . vitamin C (VITAMIN C) 250 MG tablet Take 1 tablet (250 mg total) by mouth daily. 30 tablet 0   No current facility-administered medications for this visit.     PHYSICAL EXAMINATION: ECOG PERFORMANCE  STATUS: 0 - Asymptomatic  Vitals:   05/27/19 1428  BP: (!) 144/81  Pulse: 84  Resp: 16  Temp: 98.5 F (36.9 C)  SpO2: 98%   Filed Weights   05/27/19 1428  Weight: 208 lb (94.3 kg)    Physical Exam  Constitutional: He is oriented to person, place, and time.  Elderly Caucasian male patient. He is alone.  Walking by with a walker.  HENT:  Head: Normocephalic and atraumatic.  Mouth/Throat: Oropharynx is clear and moist. No oropharyngeal exudate.  Eyes: Pupils are equal, round, and reactive to light.  Cardiovascular: Normal rate and regular rhythm.  Pulmonary/Chest: Breath sounds normal. No respiratory distress. He has no wheezes.  Abdominal: Soft. Bowel sounds are normal. He exhibits no distension and no mass. There is no abdominal tenderness. There is no rebound and no guarding.  Musculoskeletal:        General: No tenderness or edema. Normal range of motion.     Cervical back: Normal range of motion and neck supple.  Neurological: He is alert and oriented to person, place, and time.  Skin: Skin is warm.  Psychiatric: Affect normal.   .   LABORATORY DATA:  I have reviewed the data as listed Lab Results  Component Value Date   WBC 11.5 (H) 05/27/2019   HGB 10.8 (L) 05/27/2019   HCT 34.2 (L) 05/27/2019   MCV 106.9 (H) 05/27/2019   PLT 103 (L) 05/27/2019   Recent Labs    01/07/19 0711 03/26/19 1501 04/01/19 1346 04/29/19 1403 05/18/19 0902 05/27/19 1402  NA 140 138 139 138 136 137  K 4.3 3.9 3.8 4.2 4.4 4.4  CL 109 105 107 107 102 107  CO2 21* 22 26 23 23  20*  GLUCOSE 99 114* 115* 132* 121* 112*  BUN 24* 31* 30* 32* 32* 42*  CREATININE 1.51* 1.40* 1.50* 1.52* 1.41* 1.65*  CALCIUM 8.8* 9.1 8.6* 9.0 8.9 9.3  GFRNONAA 42* 45* 42* 41* 45* 37*  GFRAA 48* 53* 49* 47* 52* 43*  PROT 7.8 7.6 7.8  --   --  7.7  ALBUMIN 3.8 4.3 4.5  --   --  4.7  AST 21 20 19   --   --  18  ALT 15 20 22   --   --  20  ALKPHOS 59 59 59  --   --  51  BILITOT 0.9 1.2 1.0  --   --  1.0   BILIDIR 0.2  --   --   --   --   --  IBILI 0.7  --   --   --   --   --    ASSESSMENT & PLAN:   Anemia of chronic kidney failure, stage 3 (moderate) ([place]) # Anemia-mild.  Since 2012.  Suspect MDS/chronic kidney disease.  No bone marrow biopsy.  STABLE.   #Hemoglobin 10.3 HOLD Aranesp today; continue p.o. iron.  #Mild intermittent thrombocytopenia-question ITP; recently 80s today.  Stable at 103;  #CKD stage III-GFR stable;   #Left upper lobe cystic lesion-incidentally found on imaging with [facility]. Awaiting [place] oulmonary evaluation on 1/14th.   # I discussed regarding Covid-19 precautions.  I reviewed the vaccine effectiveness and potential side effects in detail.  Also discussed long-term effectiveness and safety profile are unclear at this time.  I discussed December, 2020 [employer] position statement-that all patients are recommended COVID-19 vaccinations [when available]-as long as they do not have allergy to components of the vaccine.  However, I think the benefits of the vaccination outweigh the potential risks. Re: [id number] vaccination.  For more information/scheduling recommend call [place]737 [phone], 8:30am-4:30pm.    #Disposition: #  HOLD Aranesp today # in 2 weeks- cbc aranesp # in 4 weeks MD- cbc/bmp/LDH-possible aranesp-Dr.B      Cammie Sickle, MD 05/27/2019 3:27 PM

## 2019-05-27 NOTE — Patient Instructions (Signed)
For more information/scheduling recommend call [place] [facility]- [phone], 8:30am-4:30pm.  

## 2019-05-28 ENCOUNTER — Telehealth: Payer: Self-pay | Admitting: Internal Medicine

## 2019-05-28 DIAGNOSIS — J984 Other disorders of lung: Secondary | ICD-10-CM | POA: Diagnosis not present

## 2019-05-28 NOTE — Telephone Encounter (Signed)
On 1/13-I spoke with daughter regarding her father's plan of care; also discussed counseled regarding Covid vaccination.

## 2019-06-07 ENCOUNTER — Other Ambulatory Visit: Payer: Self-pay

## 2019-06-07 ENCOUNTER — Emergency Department: Payer: PPO

## 2019-06-07 ENCOUNTER — Inpatient Hospital Stay
Admission: EM | Admit: 2019-06-07 | Discharge: 2019-06-09 | DRG: 291 | Disposition: A | Discharge status: Home / Self Care | Payer: PPO | Attending: Family Medicine | Admitting: Family Medicine

## 2019-06-07 DIAGNOSIS — Z951 Presence of aortocoronary bypass graft: Secondary | ICD-10-CM

## 2019-06-07 DIAGNOSIS — I447 Left bundle-branch block, unspecified: Secondary | ICD-10-CM | POA: Diagnosis not present

## 2019-06-07 DIAGNOSIS — Z79891 Long term (current) use of opiate analgesic: Secondary | ICD-10-CM | POA: Diagnosis not present

## 2019-06-07 DIAGNOSIS — L84 Corns and callosities: Secondary | ICD-10-CM | POA: Diagnosis present

## 2019-06-07 DIAGNOSIS — Z79899 Other long term (current) drug therapy: Secondary | ICD-10-CM

## 2019-06-07 DIAGNOSIS — K219 Gastro-esophageal reflux disease without esophagitis: Secondary | ICD-10-CM | POA: Diagnosis present

## 2019-06-07 DIAGNOSIS — Z888 Allergy status to other drugs, medicaments and biological substances status: Secondary | ICD-10-CM | POA: Diagnosis not present

## 2019-06-07 DIAGNOSIS — I5023 Acute on chronic systolic (congestive) heart failure: Secondary | ICD-10-CM | POA: Diagnosis present

## 2019-06-07 DIAGNOSIS — I248 Other forms of acute ischemic heart disease: Secondary | ICD-10-CM | POA: Diagnosis not present

## 2019-06-07 DIAGNOSIS — I509 Heart failure, unspecified: Secondary | ICD-10-CM

## 2019-06-07 DIAGNOSIS — Z955 Presence of coronary angioplasty implant and graft: Secondary | ICD-10-CM

## 2019-06-07 DIAGNOSIS — Z800 Family history of malignant neoplasm of digestive organs: Secondary | ICD-10-CM | POA: Diagnosis not present

## 2019-06-07 DIAGNOSIS — H9190 Unspecified hearing loss, unspecified ear: Secondary | ICD-10-CM | POA: Diagnosis not present

## 2019-06-07 DIAGNOSIS — J439 Emphysema, unspecified: Secondary | ICD-10-CM | POA: Diagnosis not present

## 2019-06-07 DIAGNOSIS — I5043 Acute on chronic combined systolic (congestive) and diastolic (congestive) heart failure: Secondary | ICD-10-CM | POA: Diagnosis not present

## 2019-06-07 DIAGNOSIS — Z8249 Family history of ischemic heart disease and other diseases of the circulatory system: Secondary | ICD-10-CM

## 2019-06-07 DIAGNOSIS — I2510 Atherosclerotic heart disease of native coronary artery without angina pectoris: Secondary | ICD-10-CM | POA: Diagnosis not present

## 2019-06-07 DIAGNOSIS — R000 Tachycardia, unspecified: Secondary | ICD-10-CM | POA: Diagnosis present

## 2019-06-07 DIAGNOSIS — I739 Peripheral vascular disease, unspecified: Secondary | ICD-10-CM | POA: Diagnosis present

## 2019-06-07 DIAGNOSIS — I252 Old myocardial infarction: Secondary | ICD-10-CM

## 2019-06-07 DIAGNOSIS — I130 Hypertensive heart and chronic kidney disease with heart failure and stage 1 through stage 4 chronic kidney disease, or unspecified chronic kidney disease: Secondary | ICD-10-CM | POA: Diagnosis not present

## 2019-06-07 DIAGNOSIS — R531 Weakness: Secondary | ICD-10-CM

## 2019-06-07 DIAGNOSIS — N1832 Chronic kidney disease, stage 3b: Secondary | ICD-10-CM | POA: Diagnosis not present

## 2019-06-07 DIAGNOSIS — I255 Ischemic cardiomyopathy: Secondary | ICD-10-CM | POA: Diagnosis present

## 2019-06-07 DIAGNOSIS — Z961 Presence of intraocular lens: Secondary | ICD-10-CM | POA: Diagnosis present

## 2019-06-07 DIAGNOSIS — R0902 Hypoxemia: Secondary | ICD-10-CM | POA: Diagnosis present

## 2019-06-07 DIAGNOSIS — Z9842 Cataract extraction status, left eye: Secondary | ICD-10-CM

## 2019-06-07 DIAGNOSIS — N400 Enlarged prostate without lower urinary tract symptoms: Secondary | ICD-10-CM | POA: Diagnosis not present

## 2019-06-07 DIAGNOSIS — D631 Anemia in chronic kidney disease: Secondary | ICD-10-CM | POA: Diagnosis present

## 2019-06-07 DIAGNOSIS — E785 Hyperlipidemia, unspecified: Secondary | ICD-10-CM | POA: Diagnosis present

## 2019-06-07 DIAGNOSIS — Z20822 Contact with and (suspected) exposure to covid-19: Secondary | ICD-10-CM | POA: Diagnosis not present

## 2019-06-07 DIAGNOSIS — Z87891 Personal history of nicotine dependence: Secondary | ICD-10-CM | POA: Diagnosis not present

## 2019-06-07 DIAGNOSIS — I480 Paroxysmal atrial fibrillation: Secondary | ICD-10-CM | POA: Diagnosis present

## 2019-06-07 DIAGNOSIS — F419 Anxiety disorder, unspecified: Secondary | ICD-10-CM | POA: Diagnosis present

## 2019-06-07 DIAGNOSIS — M170 Bilateral primary osteoarthritis of knee: Secondary | ICD-10-CM | POA: Diagnosis present

## 2019-06-07 DIAGNOSIS — M19071 Primary osteoarthritis, right ankle and foot: Secondary | ICD-10-CM | POA: Diagnosis not present

## 2019-06-07 DIAGNOSIS — R350 Frequency of micturition: Secondary | ICD-10-CM | POA: Diagnosis present

## 2019-06-07 DIAGNOSIS — I214 Non-ST elevation (NSTEMI) myocardial infarction: Secondary | ICD-10-CM | POA: Diagnosis not present

## 2019-06-07 DIAGNOSIS — L089 Local infection of the skin and subcutaneous tissue, unspecified: Secondary | ICD-10-CM

## 2019-06-07 DIAGNOSIS — N183 Chronic kidney disease, stage 3 unspecified: Secondary | ICD-10-CM | POA: Diagnosis not present

## 2019-06-07 DIAGNOSIS — Z87442 Personal history of urinary calculi: Secondary | ICD-10-CM

## 2019-06-07 DIAGNOSIS — I081 Rheumatic disorders of both mitral and tricuspid valves: Secondary | ICD-10-CM | POA: Diagnosis present

## 2019-06-07 DIAGNOSIS — R0602 Shortness of breath: Secondary | ICD-10-CM | POA: Diagnosis not present

## 2019-06-07 HISTORY — DX: Heart failure, unspecified: I50.9

## 2019-06-07 LAB — CBC WITH DIFFERENTIAL/PLATELET
Abs Immature Granulocytes: 0.44 10*3/uL — ABNORMAL HIGH (ref 0.00–0.07)
Basophils Absolute: 0 10*3/uL (ref 0.0–0.1)
Basophils Relative: 0 %
Eosinophils Absolute: 0 10*3/uL (ref 0.0–0.5)
Eosinophils Relative: 0 %
HCT: 30.9 % — ABNORMAL LOW (ref 39.0–52.0)
Hemoglobin: 10 g/dL — ABNORMAL LOW (ref 13.0–17.0)
Immature Granulocytes: 4 %
Lymphocytes Relative: 16 %
Lymphs Abs: 1.8 10*3/uL (ref 0.7–4.0)
MCH: 33.7 pg (ref 26.0–34.0)
MCHC: 32.4 g/dL (ref 30.0–36.0)
MCV: 104 fL — ABNORMAL HIGH (ref 80.0–100.0)
Monocytes Absolute: 2.1 10*3/uL — ABNORMAL HIGH (ref 0.1–1.0)
Monocytes Relative: 19 %
Neutro Abs: 7.1 10*3/uL (ref 1.7–7.7)
Neutrophils Relative %: 61 %
Platelets: 150 10*3/uL (ref 150–400)
RBC: 2.97 MIL/uL — ABNORMAL LOW (ref 4.22–5.81)
RDW: 15.9 % — ABNORMAL HIGH (ref 11.5–15.5)
Smear Review: NORMAL
WBC: 11.5 10*3/uL — ABNORMAL HIGH (ref 4.0–10.5)
nRBC: 0 % (ref 0.0–0.2)

## 2019-06-07 LAB — COMPREHENSIVE METABOLIC PANEL
ALT: 24 U/L (ref 0–44)
AST: 29 U/L (ref 15–41)
Albumin: 4 g/dL (ref 3.5–5.0)
Alkaline Phosphatase: 60 U/L (ref 38–126)
Anion gap: 6 (ref 5–15)
BUN: 32 mg/dL — ABNORMAL HIGH (ref 8–23)
CO2: 23 mmol/L (ref 22–32)
Calcium: 9 mg/dL (ref 8.9–10.3)
Chloride: 109 mmol/L (ref 98–111)
Creatinine, Ser: 1.62 mg/dL — ABNORMAL HIGH (ref 0.61–1.24)
GFR calc Af Amer: 44 mL/min — ABNORMAL LOW (ref >60)
GFR calc non Af Amer: 38 mL/min — ABNORMAL LOW (ref >60)
Glucose, Bld: 111 mg/dL — ABNORMAL HIGH (ref 70–99)
Potassium: 4.1 mmol/L (ref 3.5–5.1)
Sodium: 138 mmol/L (ref 135–145)
Total Bilirubin: 1 mg/dL (ref 0.3–1.2)
Total Protein: 7.5 g/dL (ref 6.5–8.1)

## 2019-06-07 LAB — RESPIRATORY PANEL BY RT PCR (FLU A&B, COVID)
Influenza A by PCR: NEGATIVE
Influenza B by PCR: NEGATIVE
SARS Coronavirus 2 by RT PCR: NEGATIVE

## 2019-06-07 LAB — TROPONIN I (HIGH SENSITIVITY)
Troponin I (High Sensitivity): 920 ng/L (ref <18)
Troponin I (High Sensitivity): 870 ng/L (ref <18)

## 2019-06-07 LAB — APTT: aPTT: 34 seconds (ref 24–36)

## 2019-06-07 LAB — BRAIN NATRIURETIC PEPTIDE: B Natriuretic Peptide: 961 pg/mL — ABNORMAL HIGH (ref 0.0–100.0)

## 2019-06-07 LAB — PROTIME-INR
INR: 1.1 (ref 0.8–1.2)
Prothrombin Time: 13.6 seconds (ref 11.4–15.2)

## 2019-06-07 LAB — DIGOXIN LEVEL: Digoxin Level: <0.2 ng/mL — ABNORMAL LOW (ref 0.8–2.0)

## 2019-06-07 MED ORDER — HEPARIN BOLUS VIA INFUSION
4000.0000 [IU] | Freq: Once | INTRAVENOUS | Status: AC
  Administered 2019-06-07: 4000 [IU] via INTRAVENOUS
  Filled 2019-06-07: qty 4000

## 2019-06-07 MED ORDER — HEPARIN (PORCINE) 25000 UT/250ML-% IV SOLN
1200.0000 [IU]/h | INTRAVENOUS | Status: DC
  Administered 2019-06-07: 1000 [IU]/h via INTRAVENOUS
  Administered 2019-06-08 – 2019-06-09 (×2): 1200 [IU]/h via INTRAVENOUS
  Filled 2019-06-07 (×4): qty 250

## 2019-06-07 MED ORDER — NITROGLYCERIN 0.4 MG SL SUBL: 0.4000 mg | SUBLINGUAL_TABLET | SUBLINGUAL | Status: DC | PRN

## 2019-06-07 MED ORDER — DIGOXIN 125 MCG PO TABS
0.0625 mg | ORAL_TABLET | Freq: Every day | ORAL | Status: DC
  Administered 2019-06-08 – 2019-06-09 (×2): 0.0625 mg via ORAL
  Filled 2019-06-07 (×3): qty 0.5

## 2019-06-07 MED ORDER — FLUTICASONE FUROATE-VILANTEROL 100-25 MCG/INH IN AEPB
1.0000 | INHALATION_SPRAY | Freq: Every day | RESPIRATORY_TRACT | Status: DC
  Administered 2019-06-08 – 2019-06-09 (×2): 1 via RESPIRATORY_TRACT
  Filled 2019-06-07: qty 28

## 2019-06-07 MED ORDER — SODIUM CHLORIDE 0.9% FLUSH
3.0000 mL | Freq: Two times a day (BID) | INTRAVENOUS | Status: DC
  Administered 2019-06-08 (×2): 3 mL via INTRAVENOUS

## 2019-06-07 MED ORDER — SODIUM CHLORIDE 0.9 % IV SOLN: 250.0000 mL | INTRAVENOUS | Status: DC | PRN

## 2019-06-07 MED ORDER — SODIUM CHLORIDE 0.9% FLUSH: 3.0000 mL | INTRAVENOUS | Status: DC | PRN

## 2019-06-07 MED ORDER — UMECLIDINIUM BROMIDE 62.5 MCG/INH IN AEPB
1.0000 | INHALATION_SPRAY | Freq: Every day | RESPIRATORY_TRACT | Status: DC
  Administered 2019-06-08 – 2019-06-09 (×2): 1 via RESPIRATORY_TRACT
  Filled 2019-06-07: qty 7

## 2019-06-07 MED ORDER — DOCUSATE SODIUM 100 MG PO CAPS: 100.0000 mg | ORAL_CAPSULE | Freq: Every day | ORAL | Status: DC | PRN

## 2019-06-07 MED ORDER — POLYETHYLENE GLYCOL 3350 17 G PO PACK: 17.0000 g | PACK | Freq: Every day | ORAL | Status: DC | PRN

## 2019-06-07 MED ORDER — FUROSEMIDE 10 MG/ML IJ SOLN
40.0000 mg | Freq: Every day | INTRAMUSCULAR | Status: DC
  Administered 2019-06-08: 40 mg via INTRAVENOUS
  Filled 2019-06-07: qty 4

## 2019-06-07 MED ORDER — ACETAMINOPHEN 325 MG PO TABS: 650.0000 mg | ORAL_TABLET | ORAL | Status: DC | PRN

## 2019-06-07 MED ORDER — TAMSULOSIN HCL 0.4 MG PO CAPS
0.4000 mg | ORAL_CAPSULE | Freq: Every day | ORAL | Status: DC
  Administered 2019-06-08: 0.4 mg via ORAL
  Filled 2019-06-07: qty 1

## 2019-06-07 MED ORDER — METOPROLOL TARTRATE 25 MG PO TABS
12.5000 mg | ORAL_TABLET | Freq: Two times a day (BID) | ORAL | Status: DC
  Administered 2019-06-07 – 2019-06-09 (×4): 12.5 mg via ORAL
  Filled 2019-06-07 (×4): qty 1

## 2019-06-07 MED ORDER — FLUTICASONE-UMECLIDIN-VILANT 100-62.5-25 MCG/INH IN AEPB: 1.0000 | INHALATION_SPRAY | Freq: Every day | RESPIRATORY_TRACT | Status: DC

## 2019-06-07 MED ORDER — ASPIRIN 81 MG PO CHEW
324.0000 mg | CHEWABLE_TABLET | Freq: Once | ORAL | Status: AC
  Administered 2019-06-08: 324 mg via ORAL
  Filled 2019-06-07: qty 4

## 2019-06-07 MED ORDER — ATORVASTATIN CALCIUM 20 MG PO TABS
40.0000 mg | ORAL_TABLET | Freq: Every day | ORAL | Status: DC
  Administered 2019-06-08: 40 mg via ORAL
  Filled 2019-06-07: qty 2

## 2019-06-07 MED ORDER — ASPIRIN EC 81 MG PO TBEC: 81.0000 mg | DELAYED_RELEASE_TABLET | Freq: Every day | ORAL | Status: DC

## 2019-06-07 MED ORDER — RANOLAZINE ER 500 MG PO TB12
500.0000 mg | ORAL_TABLET | Freq: Two times a day (BID) | ORAL | Status: DC
  Administered 2019-06-07 – 2019-06-09 (×4): 500 mg via ORAL
  Filled 2019-06-07 (×5): qty 1

## 2019-06-07 MED ORDER — ASPIRIN EC 81 MG PO TBEC
81.0000 mg | DELAYED_RELEASE_TABLET | Freq: Every day | ORAL | Status: DC
  Administered 2019-06-08 – 2019-06-09 (×2): 81 mg via ORAL
  Filled 2019-06-07 (×2): qty 1

## 2019-06-07 NOTE — ED Notes (Signed)
Report given to Brittney, RN

## 2019-06-07 NOTE — H&P (Signed)
History and Physical    Andres Gutierrez:[id number] DOB: 1930-07-09 DOA: 06/07/2019  PCP: Rusty Aus, MD   Patient coming from: Home  I have personally briefly reviewed patient's old medical records in [place]  Chief Complaint: Worsening shortness of breath.  HPI: Andres Gutierrez is a 86 y.o. male with medical history significant of HFrEF(35 to 40%), A. fib, CAD SP CABG, cystic bullous lung disease, anemia and anxiety came to ED with complaint of generalized weakness and worsening shortness of breath.  Denies any chest pain or palpitations.  Denies any orthopnea or PND.  He was worried about COVID-19 infection.  Denies any sick contacts.  Denies any upper respiratory symptoms, fever or chills. No nausea or vomiting or diarrhea.  ED Course: He was hemodynamically stable, COVID-19 negative, labs positive for mild leukocytosis, creatinine of 1.62, BUN of 32, BNP of 961 and troponin of 920.  He was started on heparin infusion by ED provider while waiting to rule out ACS.  Chest x-ray with pulmonary vascular congestion.  Review of Systems: As per HPI otherwise 10 point review of systems negative.   Past Medical History:  Diagnosis Date  . Anemia   . Anxiety   . Atrial fibrillation ([place])   . Cardiomyopathy ([place])   . CHF (congestive heart failure) ([place])   . Chronic kidney disease    kidney stones  . Coronary artery disease   . Cough   . Dysrhythmia   . Hypertension   . Lymphadenopathy, hilar   . MI (myocardial infarction) ([place])    x 2  . Wheezing     Past Surgical History:  Procedure Laterality Date  . BRONCHIAL NEEDLE ASPIRATION BIOPSY N/A 12/31/2014   Procedure: BRONCHIAL NEEDLE ASPIRATION BIOPSIES from carina;  Surgeon: Flora Lipps, MD;  Location: [facility] ORS;  Service: Cardiopulmonary;  Laterality: N/A;  . CARDIAC CATHETERIZATION N/A 12/28/2015   Procedure: Left Heart Cath and Cors/Grafts Angiography;  Surgeon: Yolonda Kida, MD;  Location: [place] [facility];   Service: Cardiovascular;  Laterality: N/A;  . CATARACT EXTRACTION W/PHACO Left 08/01/2016   Procedure: CATARACT EXTRACTION PHACO AND INTRAOCULAR LENS PLACEMENT ([place]) Left;  Surgeon: Leandrew Koyanagi, MD;  Location: [place];  Service: Ophthalmology;  Laterality: Left;  . CORONARY ANGIOPLASTY WITH STENT PLACEMENT    . CORONARY ARTERY BYPASS GRAFT    . ENDOBRONCHIAL ULTRASOUND N/A 12/31/2014   Procedure: ENDOBRONCHIAL ULTRASOUND;  Surgeon: Flora Lipps, MD;  Location: [facility];  Service: Cardiopulmonary;  Laterality: N/A;  . ESOPHAGOGASTRODUODENOSCOPY (EGD) WITH PROPOFOL N/A 06/20/2015   Procedure: ESOPHAGOGASTRODUODENOSCOPY (EGD) WITH PROPOFOL;  Surgeon: Lollie Sails, MD;  Location[facility] END[facility]COPY;  Service: Endoscopy;  Laterality: N/A;     reports that he quit smoking about 41 years ago. His smokeless tobacco use includes chew. He reports current alcohol use. He reports that he does not use drugs.  Allergies  Allergen Reactions  . 5ht3 Receptor Antagonists   . Diphenhydramine Hcl Other (See Comments)    Reaction:  Rash and fever a long time ago, but has had it since with no problem.  . Escitalopram Other (See Comments)    Reaction:  Makes him feel faint, like he was going to have a heart attack.  . Maxidex [Dexamethasone] Other (See Comments)    Reaction:  Unknown   . Prednisone Other (See Comments)    Pt states that this medication made him feel crazy.    . Serotonin Other (See Comments)    Tried  2 different types, lexapro and another one.  Reaction:  Made him feel like he was having a heart attack.   . Vytorin [Ezetimibe-Simvastatin] Other (See Comments)    Reaction:  Unknown   . Zocor [Simvastatin] Other (See Comments)    Reaction:  Unknown   . Diltiazem Rash    Family History  Problem Relation Age of Onset  . CAD Mother   . Colon cancer Father     Prior to Admission medications   Medication Sig Start Date End Date Taking? Authorizing Provider  acetaminophen  (TYLENOL) 500 MG tablet Take 500 mg by mouth every 6 (six) hours as needed for mild pain or moderate pain.    [provider]  atorvastatin (LIPITOR) 40 MG tablet Take 1 tablet (40 mg total) by mouth daily at 6 PM. 12/30/15   Dustin Flock, MD  digoxin (LANOXIN) 0.125 MG tablet Take 0.0625 mg by mouth daily.    [provider]  docusate sodium (COLACE) 100 MG capsule Take 100 mg by mouth daily as needed.     [provider]  ferrous sulfate 325 (65 FE) MG tablet Take 1 tablet (325 mg total) by mouth every other day. 03/04/19   Verlon Au, NP  furosemide (LASIX) 20 MG tablet Take 40 mg by mouth as needed. For weight gain greater than 2 pounds in 24 hours or greater than 5 pounds in one week    [provider]  metoprolol tartrate (LOPRESSOR) 25 MG tablet Take 0.5 tablets (12.5 mg total) by mouth 2 (two) times daily. 01/09/19   Nicholes Mango, MD  nitroGLYCERIN (NITROSTAT) 0.4 MG SL tablet Place 0.4 mg under the tongue every 5 (five) minutes as needed for chest pain.    [provider]  oxyCODONE (ROXICODONE) 5 MG immediate release tablet Take 0.5 tablets (2.5 mg total) by mouth every 6 (six) hours as needed for severe pain. 03/26/19 03/25/20  Duffy Bruce, MD  pantoprazole (PROTONIX) 40 MG tablet Take 40 mg by mouth 2 (two) times daily before a meal.     [provider]  polyethylene glycol (MIRALAX / GLYCOLAX) 17 g packet Take 17 g by mouth daily as needed for mild constipation. 01/09/19   Nicholes Mango, MD  ranolazine (RANEXA) 1000 MG SR tablet Take 1 tablet by mouth daily. 01/16/19   [provider]  sucralfate (CARAFATE) 1 G tablet Take 1 g by mouth 4 (four) times daily.     [provider]  tamsulosin (FLOMAX) 0.4 MG CAPS capsule Take 0.4 mg by mouth daily after supper.     [provider]  vitamin C (VITAMIN C) 250 MG tablet Take 1 tablet (250 mg total) by mouth daily. 11/01/15   Dustin Flock, MD    Physical  Exam: Vitals:   06/07/19 1408 06/07/19 1413 06/07/19 1414 06/07/19 1530  BP:  (!) 151/71  (!) 141/89  Pulse:    80  Resp:    (!) 21  Temp:  98.2 F (36.8 C)    TempSrc:  Oral    SpO2:  99% 97% 98%  Weight: 93 kg     Height: 6\' 4"  (1.93 m)       General: Vital signs reviewed.  Patient is well-developed elderly man, little hard of hearing, in no acute distress and cooperative with exam.  Head: Normocephalic and atraumatic. Eyes: EOMI, conjunctivae normal, no scleral icterus.  ENMT: Mucous membranes are moist.  Neck: Supple, trachea midline, normal ROM,  Cardiovascular: Irregularly irregular,  no murmurs, gallops, or rubs. Pulmonary/Chest: Basal crackles bilaterally, no wheezes. Abdominal: Soft, non-tender, non-distended, BS +, no masses, organomegaly, or guarding present.  Extremities: No lower extremity edema bilaterally,  pulses symmetric and intact bilaterally. No cyanosis or clubbing. Neurological: A&O x3, Strength is normal and symmetric bilaterally, cranial nerve II-XII are grossly intact, no focal motor deficit, sensory intact to light touch bilaterally.  Skin: Warm, dry and intact. No rashes or erythema. Psychiatric: Normal mood and affect. speech and behavior is normal. Cognition and memory are normal.   Labs on Admission: I have personally reviewed following labs and imaging studies  CBC: Recent Labs  Lab 06/07/19 1453  WBC 11.5*  NEUTROABS 7.1  HGB 10.0*  HCT 30.9*  MCV 104.0*  PLT 756   Basic Metabolic Panel: Recent Labs  Lab 06/07/19 1453  NA 138  K 4.1  CL 109  CO2 23  GLUCOSE 111*  BUN 32*  CREATININE 1.62*  CALCIUM 9.0   GFR: Estimated Creatinine Clearance: 40.2 mL/min (A) (by C-G formula based on SCr of 1.62 mg/dL (H)). Liver Function Tests: Recent Labs  Lab 06/07/19 1453  AST 29  ALT 24  ALKPHOS 60  BILITOT 1.0  PROT 7.5  ALBUMIN 4.0   No results for input(s): LIPASE, AMYLASE in the last 168 hours. No results for input(s): AMMONIA in  the last 168 hours. Coagulation Profile: Recent Labs  Lab 06/07/19 1619  INR 1.1   Cardiac Enzymes: No results for input(s): CKTOTAL, CKMB, CKMBINDEX, TROPONINI in the last 168 hours. BNP (last 3 results) No results for input(s): PROBNP in the last 8760 hours. HbA1C: No results for input(s): HGBA1C in the last 72 hours. CBG: No results for input(s): GLUCAP in the last 168 hours. Lipid Profile: No results for input(s): CHOL, HDL, LDLCALC, TRIG, CHOLHDL, LDLDIRECT in the last 72 hours. Thyroid Function Tests: No results for input(s): TSH, T4TOTAL, FREET4, T3FREE, THYROIDAB in the last 72 hours. Anemia Panel: No results for input(s): VITAMINB12, FOLATE, FERRITIN, TIBC, IRON, RETICCTPCT in the last 72 hours. Urine analysis:    Component Value Date/Time   COLORURINE YELLOW (A) 03/26/2019 1501   APPEARANCEUR HAZY (A) 03/26/2019 1501   LABSPEC 1.013 03/26/2019 1501   PHURINE 5.0 03/26/2019 1501   GLUCOSEU NEGATIVE 03/26/2019 1501   HGBUR NEGATIVE 03/26/2019 1501   BILIRUBINUR NEGATIVE 03/26/2019 1501   KETONESUR NEGATIVE 03/26/2019 1501   PROTEINUR NEGATIVE 03/26/2019 1501   UROBILINOGEN 0.2 09/11/2010 0647   NITRITE NEGATIVE 03/26/2019 1501   LEUKOCYTESUR NEGATIVE 03/26/2019 1501    Radiological Exams on Admission: DG Chest Portable 1 View  Result Date: 06/07/2019 CLINICAL DATA:  Shortness of breath for 3 weeks, weakness for 1 week, history of CHF, cardiomyopathy, coronary artery disease post MI, rare lung disease with "holes in his lungs", atrial fibrillation EXAM: PORTABLE CHEST 1 VIEW COMPARISON:  Portable exam 1417 hours compared to 05/18/2019 FINDINGS: Enlargement of cardiac silhouette post median sternotomy. Pulmonary vascular congestion. Mild scattered interstitial infiltrates favoring pulmonary edema. No pleural effusion or pneumothorax. Osseous structures unremarkable. IMPRESSION: Enlargement of cardiac silhouette with pulmonary vascular congestion and probable mild  pulmonary edema. Electronically Signed   By: Lavonia Dana M.D.   On: 06/07/2019 14:49    EKG: Independently reviewed.  Atrial fibrillation with multiform ventricular premature complexes and new T wave inversion in lateral leads.  Assessment/Plan Active Problems:   Acute on chronic heart failure (HCC)   Acute on chronic heart failure.  Echocardiogram done in August 2020 with ejection fraction of  35 to 40%.  His symptoms are more concerning for acute on chronic heart failure then new MI.  Troponin positive most likely secondary to demand. Allergy was consulted from ED-appreciate their recommendations. -Start him on Lasix 40 mg daily. -Continue home dose of digoxin. -Check digoxin level. -Trend troponin. -Daily BMP. -Strict intake and output. -Daily weight. -Repeat echocardiogram.  History of CAD.  Patient is high risk for another cardiac event. -I will continue with heparin for now till he has cardiology evaluation. -Continue home dose of metoprolol, ranolazine. -Continue nitroglycerin as needed. -Continue Lipitor. -Aspirin 81 mg daily-received full dose today.  BPH. -Continue home dose of Flomax  Cystic bullous lung disease.  Followed up by [facility] pulmonology. No wheezing at this time. -Continue home inhalers.  DVT prophylaxis: Heparin Code Status: Full code Family Communication: No family at bedside Disposition Plan: Pending improvement Consults called: Cardiology Admission status: Inpatient.   Lorella Nimrod MD [facility] Pager [phone]  If 7PM-7AM, please contact night-coverage [email or web address] Password [facility]  06/07/2019, 4:59 PM   This record has been created using Dragon voice recognition software. Errors have been sought and corrected,but may not always be located. Such creation errors do not reflect on the standard of care.

## 2019-06-07 NOTE — ED Provider Notes (Signed)
Patient is in no acute distress, has not ever had any chest pain, recently has had shortness of breath.  EKG is nonspecific but troponin is markedly elevated to 920.  I will place him on heparin and discussed with the hospitalist for admission.   Earleen Newport, MD 06/07/19 [phone]

## 2019-06-07 NOTE — ED Triage Notes (Signed)
Pt arrives via EMS from home for weakness x1 week and SHOB x3 weeks- per EMS pt has a rare lung disease with "holes in his lungs"- pt has a follow up appointment with his lung dr

## 2019-06-07 NOTE — ED Notes (Signed)
Date and time results received: 06/07/19 3:54 PM  Test: Troponin Critical Value: 920  Name of Provider Notified: Jimmye Norman

## 2019-06-07 NOTE — ED Provider Notes (Addendum)
[facility] Emergency Department Provider Note  ____________________________________________   First MD Initiated Contact with Patient 06/07/19 1403     (approximate)  I have reviewed the triage vital signs and the nursing notes.   HISTORY  Chief Complaint Weakness and Shortness of Breath    HPI Andres Gutierrez is a 86 y.o. male with anemia, anxiety, A. fib, CHF, prior MIs who comes in for some weakness and shortness of breath.  Patient is being seen by [facility] for cystic bullous disease.  It looks like patient was seen on 1/14 although I cannot view the note who comes in for generalized weakness.  Patient states that he has had some shortness of breath for weeks that seems to be unchanged in nature.  He is getting followed by [facility] for the above.  He states that he woke up today and he had some twinges of pain in his legs that then developed into some pain into his abdomen and then a little bit of pain into his chest.  These have all since resolved.  He states that he was just worried that he may have coronavirus.  He states that he did have his vaccine a few days ago but he wanted to make sure that everything looked okay.  He states that he does just worry a lot about his medical health and he want to make sure everything looked okay.          Past Medical History:  Diagnosis Date  . Anemia   . Anxiety   . Atrial fibrillation ([place])   . Cardiomyopathy ([place])   . CHF (congestive heart failure) ([place])   . Chronic kidney disease    kidney stones  . Coronary artery disease   . Cough   . Dysrhythmia   . Hypertension   . Lymphadenopathy, hilar   . MI (myocardial infarction) ([place])    x 2  . Wheezing     Patient Active Problem List   Diagnosis Date Noted  . Orthostatic hypotension 01/07/2019  . Macrocytic anemia 08/05/2018  . Anemia of chronic kidney failure, stage 3 (moderate) 08/05/2018  . BPH (benign prostatic hyperplasia) 07/24/2018   . Gastroesophageal reflux disease with esophagitis 07/24/2018  . Stage 3 chronic kidney disease 07/24/2018  . Thrombocytopenia ([place]) 07/24/2018  . Primary osteoarthritis of both knees 08/22/2017  . Chronic systolic heart failure ([place]) 01/17/2016  . Chewing tobacco use 01/17/2016  . Atrial fibrillation with RVR ([place]) 12/23/2015  . Dyspnea on exertion 12/23/2015  . Elevated troponin 12/23/2015  . GERD (gastroesophageal reflux disease) 12/23/2015  . Iron deficiency anemia due to chronic blood loss 12/20/2015  . Femoral neck fracture, left, closed, initial encounter 11/03/2015  . Chronic atrial fibrillation ([place])   . Coronary artery disease involving native coronary artery of native heart without angina pectoris   . Acalculous cholecystitis 10/29/2015  . Adenopathy   . CAD (coronary artery disease) 12/22/2014  . HTN (hypertension) 12/22/2014    Past Surgical History:  Procedure Laterality Date  . BRONCHIAL NEEDLE ASPIRATION BIOPSY N/A 12/31/2014   Procedure: BRONCHIAL NEEDLE ASPIRATION BIOPSIES from carina;  Surgeon: Flora Lipps, MD;  Location: [facility] ORS;  Service: Cardiopulmonary;  Laterality: N/A;  . CARDIAC CATHETERIZATION N/A 12/28/2015   Procedure: Left Heart Cath and Cors/Grafts Angiography;  Surgeon: Yolonda Kida, MD;  Location: [facility] CV LAB;  Service: Cardiovascular;  Laterality: N/A;  . CATARACT EXTRACTION W/PHACO Left 08/01/2016   Procedure: CATARACT EXTRACTION PHACO AND INTRAOCULAR  LENS PLACEMENT (IOC) Left;  Surgeon: Leandrew Koyanagi, MD;  Location: [facility];  Service: Ophthalmology;  Laterality: Left;  . CORONARY ANGIOPLASTY WITH STENT PLACEMENT    . CORONARY ARTERY BYPASS GRAFT    . ENDOBRONCHIAL ULTRASOUND N/A 12/31/2014   Procedure: ENDOBRONCHIAL ULTRASOUND;  Surgeon: Flora Lipps, MD;  Location: [facility] ORS;  Service: Cardiopulmonary;  Laterality: N/A;  . ESOPHAGOGASTRODUODENOSCOPY (EGD) WITH PROPOFOL N/A 06/20/2015   Procedure:  ESOPHAGOGASTRODUODENOSCOPY (EGD) WITH PROPOFOL;  Surgeon: Lollie Sails, MD;  Location: [facility] ENDOSCOPY;  Service: Endoscopy;  Laterality: N/A;    Prior to Admission medications   Medication Sig Start Date End Date Taking? Authorizing Provider  acetaminophen (TYLENOL) 500 MG tablet Take 500 mg by mouth every 6 (six) hours as needed for mild pain or moderate pain.    [provider]  atorvastatin (LIPITOR) 40 MG tablet Take 1 tablet (40 mg total) by mouth daily at 6 PM. 12/30/15   Dustin Flock, MD  digoxin (LANOXIN) 0.125 MG tablet Take 0.0625 mg by mouth daily.    [provider]  docusate sodium (COLACE) 100 MG capsule Take 100 mg by mouth daily as needed.     [provider]  ferrous sulfate 325 (65 FE) MG tablet Take 1 tablet (325 mg total) by mouth every other day. 03/04/19   Verlon Au, NP  furosemide (LASIX) 20 MG tablet Take 40 mg by mouth as needed. For weight gain greater than 2 pounds in 24 hours or greater than 5 pounds in one week    [provider]  metoprolol tartrate (LOPRESSOR) 25 MG tablet Take 0.5 tablets (12.5 mg total) by mouth 2 (two) times daily. 01/09/19   Nicholes Mango, MD  nitroGLYCERIN (NITROSTAT) 0.4 MG SL tablet Place 0.4 mg under the tongue every 5 (five) minutes as needed for chest pain.    [provider]  oxyCODONE (ROXICODONE) 5 MG immediate release tablet Take 0.5 tablets (2.5 mg total) by mouth every 6 (six) hours as needed for severe pain. 03/26/19 03/25/20  Duffy Bruce, MD  pantoprazole (PROTONIX) 40 MG tablet Take 40 mg by mouth 2 (two) times daily before a meal.     [provider]  polyethylene glycol (MIRALAX / GLYCOLAX) 17 g packet Take 17 g by mouth daily as needed for mild constipation. 01/09/19   Nicholes Mango, MD  ranolazine (RANEXA) 1000 MG SR tablet Take 1 tablet by mouth daily. 01/16/19   [provider]  sucralfate (CARAFATE) 1 G tablet Take 1 g by mouth 4 (four) times daily.      [provider]  tamsulosin (FLOMAX) 0.4 MG CAPS capsule Take 0.4 mg by mouth daily after supper.     [provider]  vitamin C (VITAMIN C) 250 MG tablet Take 1 tablet (250 mg total) by mouth daily. 11/01/15   Dustin Flock, MD    Allergies 5ht3 receptor antagonists, Diphenhydramine hcl, Escitalopram, Maxidex [dexamethasone], Prednisone, Serotonin, Vytorin [ezetimibe-simvastatin], Zocor [simvastatin], and Diltiazem  Family History  Problem Relation Age of Onset  . CAD Mother   . Colon cancer Father     Social History Social History   Tobacco Use  . Smoking status: Former Smoker    Quit date: 06/27/1977    Years since quitting: 41.9  . Smokeless tobacco: Current User    Types: Chew  Substance Use Topics  . Alcohol use: Yes    Comment: occational almost rare once a year  . Drug use: No  Review of Systems Constitutional: No fever/chills Eyes: No visual changes. ENT: No sore throat. Cardiovascular: Positive chest pain Respiratory: Positive shortness of breath Gastrointestinal: Positive twinge of abdominal pain.  No nausea, no vomiting.  No diarrhea.  No constipation. Genitourinary: Negative for dysuria. Musculoskeletal: Negative for back pain. Skin: Negative for rash. Neurological: Negative for headaches, focal weakness or numbness. All other ROS negative ____________________________________________   PHYSICAL EXAM:  VITAL SIGNS: ED Triage Vitals  Enc Vitals Group     BP 06/07/19 1413 (!) 151/71     Pulse Rate 06/07/19 1407 96     Resp 06/07/19 1407 18     Temp 06/07/19 1413 98.2 F (36.8 C)     Temp Source 06/07/19 1413 Oral     SpO2 06/07/19 1413 99 %     Weight 06/07/19 1408 205 lb (93 kg)     Height 06/07/19 1408 6\' 4"  (1.93 m)     Head Circumference --      Peak Flow --      Pain Score 06/07/19 1407 3     Pain Loc --      Pain Edu? --      Excl. in [place]? --     Constitutional: Alert and oriented. Well appearing and in no acute  distress. Eyes: Conjunctivae are normal. EOMI. Head: Atraumatic. Nose: No congestion/rhinnorhea. Mouth/Throat: Mucous membranes are moist.   Neck: No stridor. Trachea Midline. FROM Cardiovascular: Normal rate, regular rhythm. Grossly normal heart sounds.  Good peripheral circulation. Respiratory: Normal respiratory effort.  No retractions. Lungs CTAB. Gastrointestinal: Soft and nontender. No distention. No abdominal bruits.  Musculoskeletal: No lower extremity tenderness nor edema.  No joint effusions. Neurologic:  Normal speech and language. No gross focal neurologic deficits are appreciated.  Skin:  Skin is warm, dry and intact. No rash noted. Psychiatric: Mood and affect are normal. Speech and behavior are normal. GU: Deferred   ____________________________________________   LABS (all labs ordered are listed, but only abnormal results are displayed)  Labs Reviewed  CBC WITH DIFFERENTIAL/PLATELET - Abnormal; Notable for the following components:      Result Value   RBC 2.97 (*)    Hemoglobin 10.0 (*)    HCT 30.9 (*)    MCV 104.0 (*)    RDW 15.9 (*)    All other components within normal limits  RESPIRATORY PANEL BY RT PCR (FLU A&B, COVID)  COMPREHENSIVE METABOLIC PANEL  BRAIN NATRIURETIC PEPTIDE  TROPONIN I (HIGH SENSITIVITY)   ____________________________________________   ED ECG REPORT I, Vanessa Seaforth, the attending physician, personally viewed and interpreted this ECG.  EKG looks like A. fib with a widening QRS, no ST elevation, no T wave inversions, occasional PVC ____________________________________________  RADIOLOGY Robert Bellow, personally viewed and evaluated these images (plain radiographs) as part of my medical decision making, as well as reviewing the written report by the radiologist.  ED MD interpretation: Mild pulmonary edema  Official radiology report(s): DG Chest Portable 1 View  Result Date: 06/07/2019 CLINICAL DATA:  Shortness of breath for 3  weeks, weakness for 1 week, history of CHF, cardiomyopathy, coronary artery disease post MI, rare lung disease with "holes in his lungs", atrial fibrillation EXAM: PORTABLE CHEST 1 VIEW COMPARISON:  Portable exam 1417 hours compared to 05/18/2019 FINDINGS: Enlargement of cardiac silhouette post median sternotomy. Pulmonary vascular congestion. Mild scattered interstitial infiltrates favoring pulmonary edema. No pleural effusion or pneumothorax. Osseous structures unremarkable. IMPRESSION: Enlargement of cardiac silhouette with pulmonary vascular congestion and probable  mild pulmonary edema. Electronically Signed   By: Lavonia Dana M.D.   On: 06/07/2019 14:49    ____________________________________________   PROCEDURES  Procedure(s) performed (including Critical Care):  .Critical Care Performed by: Vanessa [place], MD Authorized by: Vanessa Kilkenny, MD   Critical care provider statement:    Critical care time (minutes):  31   Critical care was necessary to treat or prevent imminent or life-threatening deterioration of the following conditions:  Circulatory failure   Critical care was time spent personally by me on the following activities:  Discussions with consultants, evaluation of patient's response to treatment, examination of patient, ordering and performing treatments and interventions, ordering and review of laboratory studies, ordering and review of radiographic studies, pulse oximetry, re-evaluation of patient's condition, obtaining history from patient or surrogate and review of old charts     ____________________________________________   INITIAL IMPRESSION / ASSESSMENT AND PLAN / ED COURSE  SYRE KNERR was evaluated in Emergency Department on 06/07/2019 for the symptoms described in the history of present illness. He was evaluated in the context of the global COVID-19 pandemic, which necessitated consideration that the patient might be at risk for infection with the SARS-CoV-2 virus  that causes COVID-19. Institutional protocols and algorithms that pertain to the evaluation of patients at risk for COVID-19 are in a state of rapid change based on information released by regulatory bodies including the CDC and federal and state organizations. These policies and algorithms were followed during the patient's care in the ED.    Patient is a well-appearing 86 year old with normal saturation who states that he initially came in because he started having some twinges of pain in his legs his abdomen and then his chest.  His pain has since completely resolved.  He states that he was just worried he may have coronavirus or some other illness going on.  Patient's lungs sound clear.  He is has a history of cyst in his lungs is being followed by [facility].  Will get labs to evaluate for electrolyte abnormalities, AKI, ACS.  Will get chest x-ray to evaluate for pneumonia, pleural effusions.  Low suspicion for PE at this time given patient's not hypoxic or tachycardic and very well-appearing.  Will get ambulatory sat on patient..  Patient handed off to oncoming team pending labs, chest x-ray and reevaluation.  Suspect patient will be able to be discharged home given he is very well-appearing.  ____________________________________________   FINAL CLINICAL IMPRESSION(S) / ED DIAGNOSES   Final diagnoses:  Weakness      MEDICATIONS GIVEN DURING THIS VISIT:  Medications - No data to display   ED Discharge Orders    None       Note:  This document was prepared using Dragon voice recognition software and may include unintentional dictation errors.   Vanessa Burleson, MD 06/07/19 1525    Vanessa [place], MD 06/27/19 [phone]

## 2019-06-07 NOTE — Progress Notes (Signed)
Pt admitted to 2A, oriented to room, dinner ordered upon assessment pt has  Opened callous on bilat feet with noticeable blackened area, pt states he is being followed by podiatrist Benard Rink for them. He also has a cyst on right forearm. This assessment  has been verified by a second Nurse Anderson Malta S)

## 2019-06-07 NOTE — Progress Notes (Signed)
ANTICOAGULATION CONSULT NOTE - Initial Consult  Pharmacy Consult for Heparin Indication: chest pain/ACS  Allergies  Allergen Reactions  . 5ht3 Receptor Antagonists   . Diphenhydramine Hcl Other (See Comments)    Reaction:  Rash and fever a long time ago, but has had it since with no problem.  . Escitalopram Other (See Comments)    Reaction:  Makes him feel faint, like he was going to have a heart attack.  . Maxidex [Dexamethasone] Other (See Comments)    Reaction:  Unknown   . Prednisone Other (See Comments)    Pt states that this medication made him feel crazy.    . Serotonin Other (See Comments)    Tried 2 different types, lexapro and another one.  Reaction:  Made him feel like he was having a heart attack.   . Vytorin [Ezetimibe-Simvastatin] Other (See Comments)    Reaction:  Unknown   . Zocor [Simvastatin] Other (See Comments)    Reaction:  Unknown   . Diltiazem Rash    Patient Measurements: Height: 6\' 4"  (193 cm) Weight: 205 lb (93 kg) IBW/kg (Calculated) : 86.8 Heparin Dosing Weight: 93 kg  Vital Signs: Temp: 98.2 F (36.8 C) (01/24 1413) Temp Source: Oral (01/24 1413) BP: 141/89 (01/24 1530) Pulse Rate: 80 (01/24 1530)  Labs: Recent Labs    06/07/19 1453  HGB 10.0*  HCT 30.9*  PLT 150  CREATININE 1.62*  TROPONINIHS 920*    Estimated Creatinine Clearance: 40.2 mL/min (A) (by C-G formula based on SCr of 1.62 mg/dL (H)).   Medical History: Past Medical History:  Diagnosis Date  . Anemia   . Anxiety   . Atrial fibrillation ([place])   . Cardiomyopathy ([place])   . CHF (congestive heart failure) ([place])   . Chronic kidney disease    kidney stones  . Coronary artery disease   . Cough   . Dysrhythmia   . Hypertension   . Lymphadenopathy, hilar   . MI (myocardial infarction) ([place])    x 2  . Wheezing     Medications:  Infusions:  . heparin      Assessment: 86 yo male with anemia, anxiety, A. Fib, CHF, prior MIs comes in for weakness and SOB.   Troponin markedly elevated to 920  Goal of Therapy:  Heparin level 0.3-0.7 units/ml Monitor platelets by anticoagulation protocol: Yes   Plan:  Give 4000 units bolus x 1 Start heparin infusion at 1000 units/hr Check anti-Xa level in 8 hours and daily while on heparin Continue to monitor H&H and platelets  Naiyah Klostermann K , RPh 06/07/2019,4:21 PM

## 2019-06-08 ENCOUNTER — Inpatient Hospital Stay
Admit: 2019-06-08 | Discharge: 2019-06-08 | Disposition: A | Discharge status: Home / Self Care | Payer: PPO | Attending: Internal Medicine | Admitting: Internal Medicine

## 2019-06-08 ENCOUNTER — Inpatient Hospital Stay: Payer: PPO

## 2019-06-08 DIAGNOSIS — L089 Local infection of the skin and subcutaneous tissue, unspecified: Secondary | ICD-10-CM

## 2019-06-08 DIAGNOSIS — R0602 Shortness of breath: Secondary | ICD-10-CM

## 2019-06-08 LAB — BASIC METABOLIC PANEL
Anion gap: 7 (ref 5–15)
BUN: 32 mg/dL — ABNORMAL HIGH (ref 8–23)
CO2: 21 mmol/L — ABNORMAL LOW (ref 22–32)
Calcium: 8.4 mg/dL — ABNORMAL LOW (ref 8.9–10.3)
Chloride: 109 mmol/L (ref 98–111)
Creatinine, Ser: 1.61 mg/dL — ABNORMAL HIGH (ref 0.61–1.24)
GFR calc Af Amer: 44 mL/min — ABNORMAL LOW (ref >60)
GFR calc non Af Amer: 38 mL/min — ABNORMAL LOW (ref >60)
Glucose, Bld: 103 mg/dL — ABNORMAL HIGH (ref 70–99)
Potassium: 3.8 mmol/L (ref 3.5–5.1)
Sodium: 137 mmol/L (ref 135–145)

## 2019-06-08 LAB — CBC
HCT: 27.1 % — ABNORMAL LOW (ref 39.0–52.0)
Hemoglobin: 8.8 g/dL — ABNORMAL LOW (ref 13.0–17.0)
MCH: 33.8 pg (ref 26.0–34.0)
MCHC: 32.5 g/dL (ref 30.0–36.0)
MCV: 104.2 fL — ABNORMAL HIGH (ref 80.0–100.0)
Platelets: 142 10*3/uL — ABNORMAL LOW (ref 150–400)
RBC: 2.6 MIL/uL — ABNORMAL LOW (ref 4.22–5.81)
RDW: 16.1 % — ABNORMAL HIGH (ref 11.5–15.5)
WBC: 10.4 10*3/uL (ref 4.0–10.5)
nRBC: 0 % (ref 0.0–0.2)

## 2019-06-08 LAB — ECHOCARDIOGRAM COMPLETE
Height: 76 in
Weight: 3272 oz

## 2019-06-08 LAB — HEPARIN LEVEL (UNFRACTIONATED)
Heparin Unfractionated: 0.26 IU/mL — ABNORMAL LOW (ref 0.30–0.70)
Heparin Unfractionated: 0.48 IU/mL (ref 0.30–0.70)
Heparin Unfractionated: 0.52 IU/mL (ref 0.30–0.70)

## 2019-06-08 NOTE — Progress Notes (Signed)
*  PRELIMINARY RESULTS* Echocardiogram 2D Echocardiogram has been performed.  Andres Gutierrez 06/08/2019, 11:50 AM

## 2019-06-08 NOTE — Progress Notes (Signed)
CHF education provided to patient.  Pt able to verbalize the importance of daily weights; who and when to call regarding weight gain; dietary restrictions; medications; and following-up with appointments at the CHF clinic.  Additional education provided regarding fluid restriction; and pt verbalized understanding.

## 2019-06-08 NOTE — Consult Note (Signed)
I have placed a request via Secure Chat to Dr.Amin requesting photos of the wound areas of concern to be placed in the EMR.   [place], [place], [place]

## 2019-06-08 NOTE — Plan of Care (Signed)
  Problem: Education: Goal: Knowledge of General Education information will improve Description: Including pain rating scale, medication(s)/side effects and non-pharmacologic comfort measures Outcome: Progressing   Problem: Health Behavior/Discharge Planning: Goal: Ability to manage health-related needs will improve Outcome: Progressing   Problem: Clinical Measurements: Goal: Ability to maintain clinical measurements within normal limits will improve Outcome: Progressing   Problem: Activity: Goal: Risk for activity intolerance will decrease Outcome: Progressing   Problem: Nutrition: Goal: Adequate nutrition will be maintained Outcome: Progressing   Problem: Coping: Goal: Level of anxiety will decrease Outcome: Progressing   Problem: Pain Managment: Goal: General experience of comfort will improve Outcome: Progressing   Problem: Safety: Goal: Ability to remain free from injury will improve Outcome: Progressing   Problem: Cardiovascular: Goal: Ability to achieve and maintain adequate cardiovascular perfusion will improve Outcome: Progressing Goal: Vascular access site(s) Level 0-1 will be maintained Outcome: Progressing   Problem: Education: Goal: Ability to demonstrate management of disease process will improve Outcome: Progressing Goal: Ability to verbalize understanding of medication therapies will improve Outcome: Progressing

## 2019-06-08 NOTE — Progress Notes (Signed)
[place] for Heparin Indication: chest pain/ACS  Allergies  Allergen Reactions  . 5ht3 Receptor Antagonists   . Diphenhydramine Hcl Other (See Comments)    Reaction:  Rash and fever a long time ago, but has had it since with no problem.  . Escitalopram Other (See Comments)    Reaction:  Makes him feel faint, like he was going to have a heart attack.  . Maxidex [Dexamethasone] Other (See Comments)    Reaction:  Unknown   . Prednisone Other (See Comments)    Pt states that this medication made him feel crazy.    . Serotonin Other (See Comments)    Tried 2 different types, lexapro and another one.  Reaction:  Made him feel like he was having a heart attack.   . Vytorin [Ezetimibe-Simvastatin] Other (See Comments)    Reaction:  Unknown   . Zocor [Simvastatin] Other (See Comments)    Reaction:  Unknown   . Diltiazem Rash    Patient Measurements: Height: 6\' 4"  (193 cm) Weight: 204 lb 8 oz (92.8 kg) IBW/kg (Calculated) : 86.8 Heparin Dosing Weight: 93 kg  Vital Signs: Temp: 98.2 F (36.8 C) (01/25 1631) Temp Source: Oral (01/25 1631) BP: 123/61 (01/25 1631) Pulse Rate: 71 (01/25 1631)  Labs: Recent Labs    06/07/19 1453 06/07/19 1619 06/07/19 1837 06/08/19 0100 06/08/19 0700 06/08/19 0943 06/08/19 1847  HGB 10.0*  --   --   --  8.8*  --   --   HCT 30.9*  --   --   --  27.1*  --   --   PLT 150  --   --   --  142*  --   --   APTT  --  34  --   --   --   --   --   LABPROT  --  13.6  --   --   --   --   --   INR  --  1.1  --   --   --   --   --   HEPARINUNFRC  --   --   --  0.26*  --  0.48 0.52  CREATININE 1.62*  --   --  1.61*  --   --   --   TROPONINIHS 920*  --  870*  --   --   --   --     Estimated Creatinine Clearance: 40.4 mL/min (A) (by C-G formula based on SCr of 1.61 mg/dL (H)).   Medical History: Past Medical History:  Diagnosis Date  . Anemia   . Anxiety   . Atrial fibrillation ([place])   . Cardiomyopathy ([place])    . CHF (congestive heart failure) ([place])   . Chronic kidney disease    kidney stones  . Coronary artery disease   . Cough   . Dysrhythmia   . Hypertension   . Lymphadenopathy, hilar   . MI (myocardial infarction) ([place])    x 2  . Wheezing     Assessment: 86 yo male with anemia, anxiety, A. Fib, CHF, prior MIs comes in for weakness and SOB.  Troponin markedly elevated to 920  1/25 0943 HL 0.48 therapeutic x 1 1/25 1847 HL 0.52 therapeutic x 2    Goal of Therapy:  Heparin level 0.3-0.7 units/ml Monitor platelets by anticoagulation protocol: Yes   Plan:  Continue heparin drip at 1200 units/hr.  Continue to follow plan for heparin.   HL/CBC  daily while on heparin drip.  Lu Duffel, PharmD, BCPS Clinical Pharmacist 06/08/2019 7:48 PM

## 2019-06-08 NOTE — Clinical Social Work Note (Signed)
1.  Do you have a scale? Yes  2.  Do you weigh yourself daily? Yes 3.  Two pounds over night or 5 in one week call your doctor because it could be fluid overload? Yes  4.  Do you go to the Heart Failure Clinic? Yes 5. An appointment with heart failure clinic will be made before discharge if. Follow up apt in June  Andres Gutierrez [place], [place]

## 2019-06-08 NOTE — Consult Note (Signed)
[facility] Consultation Note  Patient ID: Andres Gutierrez, MRN: [id number], DOB/AGE: May 31, 1930 86 y.o. Admit date: 06/07/2019   Date of Consult: 06/08/2019 Primary Physician: Rusty Aus, MD Primary Cardiologist: Fath  Chief Complaint:  Chief Complaint  Patient presents with  . Weakness  . Shortness of Breath   Reason for Consult: Acute on chronic good systolic dysfunction heart failure with elevated troponin  HPI: 86 y.o. male with known coronary disease status post coronary to bypass graft and previous stenting with a cardiac catheterization in 2017 showing patent grafts but small vessel disease needing medication management.  He had been on appropriate medication management for anginal symptoms and LV dysfunction when he was here having acute episode of shortness of breath PND and orthopnea and lower extremity edema upon arrival his EKG had showed normal sinus rhythm left axis deviation left atrial enlargement and left bundle branch block.  The patient also had a hemoglobin of 8.8 glomerular filtration rate of 44 a troponin of 920 BNP of 961.  The patient was given intravenous Lasix and this did improve his shortness of breath.  CAT scan showed bolus emphysema with pulmonary edema which appears to be clinically improved.  In addition to that the patient's has had less hypoxia.  Echocardiogram has shown severe LV systolic dysfunction with ejection fraction of 20% with moderate valvular heart disease worse than before.  Therefore the patient does have acute on chronic systolic dysfunction heart failure but no evidence of acute coronary syndrome with no evidence of chest discomfort.  He has remained on appropriate high intensity cholesterol therapy furosemide metoprolol and Ranexa with reasonable control.  Additionally he has had atrial fibrillation paroxysmal in nature but currently remains in normal sinus rhythm.  Therefore no further intervention necessary at this time  Past  Medical History:  Diagnosis Date  . Anemia   . Anxiety   . Atrial fibrillation ([place])   . Cardiomyopathy ([place])   . CHF (congestive heart failure) ([place])   . Chronic kidney disease    kidney stones  . Coronary artery disease   . Cough   . Dysrhythmia   . Hypertension   . Lymphadenopathy, hilar   . MI (myocardial infarction) ([place])    x 2  . Wheezing       Surgical History:  Past Surgical History:  Procedure Laterality Date  . BRONCHIAL NEEDLE ASPIRATION BIOPSY N/A 12/31/2014   Procedure: BRONCHIAL NEEDLE ASPIRATION BIOPSIES from carina;  Surgeon: Flora Lipps, MD;  Location: [facility] ORS;  Service: Cardiopulmonary;  Laterality: N/A;  . CARDIAC CATHETERIZATION N/A 12/28/2015   Procedure: Left Heart Cath and Cors/Grafts Angiography;  Surgeon: Yolonda Kida, MD;  Location: [place] [facility];  Service: Cardiovascular;  Laterality: N/A;  . CATARACT EXTRACTION W/PHACO Left 08/01/2016   Procedure: CATARACT EXTRACTION PHACO AND INTRAOCULAR LENS PLACEMENT ([place]) Left;  Surgeon: Leandrew Koyanagi, MD;  Location: [place];  Service: Ophthalmology;  Laterality: Left;  . CORONARY ANGIOPLASTY WITH STENT PLACEMENT    . CORONARY ARTERY BYPASS GRAFT    . ENDOBRONCHIAL ULTRASOUND N/A 12/31/2014   Procedure: ENDOBRONCHIAL ULTRASOUND;  Surgeon: Flora Lipps, MD;  Location: [facility];  Service: Cardiopulmonary;  Laterality: N/A;  . ESOPHAGOGASTRODUODENOSCOPY (EGD) WITH PROPOFOL N/A 06/20/2015   Procedure: ESOPHAGOGASTRODUODENOSCOPY (EGD) WITH PROPOFOL;  Surgeon: Lollie Sails, MD;  Location: [facility] ENDOSCOPY;  Service: Endoscopy;  Laterality: N/A;     Home Meds: Prior to Admission medications   Medication Sig Start Date End Date Taking? Authorizing  Provider  acetaminophen (TYLENOL) 500 MG tablet Take 500-1,000 mg by mouth every 6 (six) hours as needed for mild pain, moderate pain or fever.    Yes [provider]  atorvastatin (LIPITOR) 40 MG tablet Take 1 tablet (40 mg total) by mouth  daily at 6 PM. 12/30/15  Yes Dustin Flock, MD  digoxin (LANOXIN) 0.125 MG tablet Take 0.0625 mg by mouth daily.   Yes [provider]  docusate sodium (COLACE) 100 MG capsule Take 100 mg by mouth daily as needed for mild constipation.    Yes [provider]  famotidine (PEPCID) 20 MG tablet Take 20 mg by mouth at bedtime. 02/04/19  Yes [provider]  ferrous sulfate 325 (65 FE) MG tablet Take 1 tablet (325 mg total) by mouth every other day. Patient taking differently: Take 325 mg by mouth every Monday, Wednesday, and Friday.  03/04/19  Yes Verlon Au, NP  furosemide (LASIX) 20 MG tablet Take 40 mg by mouth as needed (weight gain greater than 2 pounds in 24 hours or greater than 5 pounds in one week).    Yes [provider]  metoprolol tartrate (LOPRESSOR) 25 MG tablet Take 0.5 tablets (12.5 mg total) by mouth 2 (two) times daily. 01/09/19  Yes Gouru, Illene Silver, MD  nitroGLYCERIN (NITROSTAT) 0.4 MG SL tablet Place 0.4 mg under the tongue every 5 (five) minutes as needed for chest pain.   Yes [provider]  polyethylene glycol (MIRALAX / GLYCOLAX) 17 g packet Take 17 g by mouth daily as needed for mild constipation. 01/09/19  Yes Gouru, Illene Silver, MD  ranolazine (RANEXA) 1000 MG SR tablet Take 500 mg by mouth 2 (two) times daily.  01/16/19  Yes [provider]  sucralfate (CARAFATE) 1 G tablet Take 1 g by mouth 2 (two) times daily.    Yes [provider]  tamsulosin (FLOMAX) 0.4 MG CAPS capsule Take 0.4 mg by mouth daily after supper.    Yes [provider]  TRELEGY ELLIPTA 100-62.5-25 MCG/INH AEPB Inhale 1 puff into the lungs daily. 05/28/19  Yes [provider]  vitamin B-12 (CYANOCOBALAMIN) 500 MCG tablet Take 500 mcg by mouth daily.   Yes [provider]  vitamin C (VITAMIN C) 250 MG tablet Take 1 tablet (250 mg total) by mouth daily. 11/01/15  Yes Dustin Flock, MD    Inpatient Medications:  . aspirin  324  mg Oral Once  . aspirin EC  81 mg Oral Daily  . atorvastatin  40 mg Oral q1800  . digoxin  0.0625 mg Oral Daily  . fluticasone furoate-vilanterol  1 puff Inhalation Daily   And  . umeclidinium bromide  1 puff Inhalation Daily  . furosemide  40 mg Intravenous Daily  . metoprolol tartrate  12.5 mg Oral BID  . ranolazine  500 mg Oral BID  . sodium chloride flush  3 mL Intravenous Q12H  . tamsulosin  0.4 mg Oral QPC supper   . sodium chloride    . heparin 1,200 Units/hr (06/08/19 1527)    Allergies:  Allergies  Allergen Reactions  . 5ht3 Receptor Antagonists   . Diphenhydramine Hcl Other (See Comments)    Reaction:  Rash and fever a long time ago, but has had it since with no problem.  . Escitalopram Other (See Comments)    Reaction:  Makes him feel faint, like he was going to have a heart attack.  . Maxidex [Dexamethasone] Other (See Comments)    Reaction:  Unknown   .  Prednisone Other (See Comments)    Pt states that this medication made him feel crazy.    . Serotonin Other (See Comments)    Tried 2 different types, lexapro and another one.  Reaction:  Made him feel like he was having a heart attack.   . Vytorin [Ezetimibe-Simvastatin] Other (See Comments)    Reaction:  Unknown   . Zocor [Simvastatin] Other (See Comments)    Reaction:  Unknown   . Diltiazem Rash    Social History   Socioeconomic History  . Marital status: Widowed    Spouse name: Not on file  . Number of children: 2  . Years of education: college  . Highest education level: Some college, no degree  Occupational History  . Occupation: retired  Tobacco Use  . Smoking status: Former Smoker    Quit date: 06/27/1977    Years since quitting: 41.9  . Smokeless tobacco: Current User    Types: Chew  Substance and Sexual Activity  . Alcohol use: Yes    Comment: occational almost rare once a year  . Drug use: No  . Sexual activity: Never    Birth control/protection: Abstinence  Other Topics Concern  .  Not on file  Social History Narrative  . Not on file   Social Determinants of Health   Financial Resource Strain:   . Difficulty of Paying Living Expenses: Not on file  Food Insecurity:   . Worried About Charity fundraiser in the Last Year: Not on file  . Ran Out of Food in the Last Year: Not on file  Transportation Needs:   . Lack of Transportation (Medical): Not on file  . Lack of Transportation (Non-Medical): Not on file  Physical Activity:   . Days of Exercise per Week: Not on file  . Minutes of Exercise per Session: Not on file  Stress:   . Feeling of Stress : Not on file  Social Connections:   . Frequency of Communication with Friends and Family: Not on file  . Frequency of Social Gatherings with Friends and Family: Not on file  . Attends Religious Services: Not on file  . Active Member of Clubs or Organizations: Not on file  . Attends Archivist Meetings: Not on file  . Marital Status: Not on file  Intimate Partner Violence:   . Fear of Current or Ex-Partner: Not on file  . Emotionally Abused: Not on file  . Physically Abused: Not on file  . Sexually Abused: Not on file     Family History  Problem Relation Age of Onset  . CAD Mother   . Colon cancer Father      Review of Systems Positive for shortness of breath PND orthopnea Negative for: General:  chills, fever, night sweats or weight changes.  Cardiovascular: Positive for PND orthopnea negative for syncope dizziness  Dermatological skin lesions rashes Respiratory: Cough congestion Urologic: Frequent urination urination at night and hematuria Abdominal: negative for nausea, vomiting, diarrhea, bright red blood per rectum, melena, or hematemesis Neurologic: negative for visual changes, and/or hearing changes  All other systems reviewed and are otherwise negative except as noted above.  Labs: No results for input(s): CKTOTAL, CKMB, TROPONINI in the last 72 hours. Lab Results  Component Value Date    WBC 10.4 06/08/2019   HGB 8.8 (L) 06/08/2019   HCT 27.1 (L) 06/08/2019   MCV 104.2 (H) 06/08/2019   PLT 142 (L) 06/08/2019    Recent Labs  Lab 06/07/19 1453  06/07/19 1453 06/08/19 0100  NA 138   < > 137  K 4.1   < > 3.8  CL 109   < > 109  CO2 23   < > 21*  BUN 32*   < > 32*  CREATININE 1.62*   < > 1.61*  CALCIUM 9.0   < > 8.4*  PROT 7.5  --   --   BILITOT 1.0  --   --   ALKPHOS 60  --   --   ALT 24  --   --   AST 29  --   --   GLUCOSE 111*   < > 103*   < > = values in this interval not displayed.   Lab Results  Component Value Date   CHOL 117 12/26/2015   HDL 21 (L) 12/26/2015   LDLCALC 80 12/26/2015   TRIG 78 12/26/2015   Lab Results  Component Value Date   DDIMER (H) 09/10/2010    1.46        AT THE INHOUSE ESTABLISHED CUTOFF VALUE OF 0.48 ug/mL FEU, THIS ASSAY HAS BEEN DOCUMENTED IN THE LITERATURE TO HAVE A SENSITIVITY AND NEGATIVE PREDICTIVE VALUE OF AT LEAST 98 TO 99%.  THE TEST RESULT SHOULD BE CORRELATED WITH AN ASSESSMENT OF THE CLINICAL PROBABILITY OF DVT / VTE.    Radiology/Studies:  DG Chest 2 View  Result Date: 05/18/2019 CLINICAL DATA:  Dyspnea EXAM: CHEST - 2 VIEW COMPARISON:  02/25/2017 chest radiograph. FINDINGS: Stable discontinuities in the second and third superior most sternotomy wires. CABG clips overlie the mediastinum. Stable cardiomediastinal silhouette with normal heart size. No pneumothorax. No pleural effusion. No pulmonary edema. Scattered chronic mild reticular opacities at both lung bases are mildly increased. No acute consolidative airspace disease. Chronic mild bullous emphysema at the left lung apex. Hyperinflated lungs. IMPRESSION: 1. No acute cardiopulmonary disease. 2. Chronic mild bullous emphysema at the left lung apex. Hyperinflated lungs suggest COPD. 3. Chronic mild scattered reticular opacities at the lung bases, mildly increased, favor mild scarring or atelectasis. Electronically Signed   By: Ilona Sorrel M.D.   On:  05/18/2019 11:52   DG Chest Portable 1 View  Result Date: 06/07/2019 CLINICAL DATA:  Shortness of breath for 3 weeks, weakness for 1 week, history of CHF, cardiomyopathy, coronary artery disease post MI, rare lung disease with "holes in his lungs", atrial fibrillation EXAM: PORTABLE CHEST 1 VIEW COMPARISON:  Portable exam 1417 hours compared to 05/18/2019 FINDINGS: Enlargement of cardiac silhouette post median sternotomy. Pulmonary vascular congestion. Mild scattered interstitial infiltrates favoring pulmonary edema. No pleural effusion or pneumothorax. Osseous structures unremarkable. IMPRESSION: Enlargement of cardiac silhouette with pulmonary vascular congestion and probable mild pulmonary edema. Electronically Signed   By: Lavonia Dana M.D.   On: 06/07/2019 14:49   DG Foot Complete Left  Result Date: 06/08/2019 CLINICAL DATA:  "Bilateral foot infections". EXAM: LEFT FOOT - COMPLETE 3+ VIEW COMPARISON:  None. FINDINGS: First metatarsophalangeal advanced osteoarthritis, secondary to hallux valgus deformity. Remote fractures of the second through fourth metatarsals. No acute fracture or dislocation. Small calcaneal spur. No well-defined soft tissue ulcer. No soft tissue gas. No osseous destruction. IMPRESSION: No acute osseous abnormality. Electronically Signed   By: Abigail Miyamoto M.D.   On: 06/08/2019 14:34   DG Foot Complete Right  Result Date: 06/08/2019 CLINICAL DATA:  Foot infection EXAM: RIGHT FOOT COMPLETE - 3+ VIEW COMPARISON:  A 09/2015 FINDINGS: Chronic fracture second metatarsal with exuberant chronic periosteal bone formation. Single screw in  the fracture. Mild displacement of fracture, chronic and unchanged No acute fracture.  Mild hallux valgus.  No erosion. Mild arterial calcification.  Negative for soft tissue swelling. IMPRESSION: Chronic fracture deformity second metatarsal unchanged. Hallux valgus with degenerative change. No acute abnormality. Electronically Signed   By: Franchot Gallo  M.D.   On: 06/08/2019 14:35   ECHOCARDIOGRAM COMPLETE  Result Date: 06/08/2019   ECHOCARDIOGRAM REPORT   Patient Name:   MOHAMMEDALI BEDOY Date of Exam: 06/08/2019 Medical Rec #:  [id number]    Height:       76.0 in Accession #:    [id number]   Weight:       204.5 lb Date of Birth:  [birth year]    BSA:          2.24 m Patient Age:    60 years     BP:           124/81 mmHg Patient Gender: M            HR:           68 bpm. Exam Location:  [facility] Procedure: 2D Echo, Color Doppler and Cardiac Doppler Indications:     R06.00 Dyspnea  History:         Patient has prior history of Echocardiogram examinations, most                  recent 01/08/2019. CHF and Cardiomyopathy, CAD, Prior CABG, CKD;                  Risk Factors:Hypertension.  Sonographer:     Charmayne Sheer RDCS (AE) Referring Phys:  [id number] [facility] AMIN Diagnosing Phys: Serafina Royals MD IMPRESSIONS  1. Left ventricular ejection fraction, by visual estimation, is 20 to 25%. The left ventricle has severely decreased function. There is no left ventricular hypertrophy.  2. Moderately dilated left ventricular internal cavity size.  3. The left ventricle demonstrates global hypokinesis.  4. Global right ventricle has normal systolic function.The right ventricular size is normal. No increase in right ventricular wall thickness.  5. Left atrial size was mildly dilated.  6. Right atrial size was mildly dilated.  7. The mitral valve is normal in structure. Moderate mitral valve regurgitation.  8. The tricuspid valve is normal in structure.  9. The tricuspid valve is normal in structure. Tricuspid valve regurgitation is moderate. 10. The aortic valve is normal in structure. Aortic valve regurgitation is trivial. 11. The pulmonic valve was normal in structure. Pulmonic valve regurgitation is trivial. FINDINGS  Left Ventricle: Left ventricular ejection fraction, by visual estimation, is 20 to 25%. The left ventricle has severely decreased function. The left ventricle demonstrates  global hypokinesis. The left ventricular internal cavity size was moderately dilated left ventricle. There is no left ventricular hypertrophy. Right Ventricle: The right ventricular size is normal. No increase in right ventricular wall thickness. Global RV systolic function is has normal systolic function. Left Atrium: Left atrial size was mildly dilated. Right Atrium: Right atrial size was mildly dilated Pericardium: There is no evidence of pericardial effusion. Mitral Valve: The mitral valve is normal in structure. Moderate mitral valve regurgitation. MV peak gradient, 6.6 mmHg. Tricuspid Valve: The tricuspid valve is normal in structure. Tricuspid valve regurgitation is moderate. Aortic Valve: The aortic valve is normal in structure. Aortic valve regurgitation is trivial. Aortic valve mean gradient measures 7.0 mmHg. Aortic valve peak gradient measures 12.8 mmHg. Aortic valve area, by VTI measures 1.98 cm. Pulmonic Valve: The pulmonic  valve was normal in structure. Pulmonic valve regurgitation is trivial. Pulmonic regurgitation is trivial. Aorta: The aortic root, ascending aorta and aortic arch are all structurally normal, with no evidence of dilitation or obstruction. IAS/Shunts: No atrial level shunt detected by color flow Doppler.  LEFT VENTRICLE PLAX 2D LVIDd:         5.81 cm       Diastology LVIDs:         5.22 cm       LV e' medial:   5.77 cm/s LV PW:         1.25 cm       LV E/e' medial: 22.2 LV IVS:        0.88 cm LVOT diam:     2.20 cm LV SV:         37 ml LV SV Index:   16.37 LVOT Area:     3.80 cm  LV Volumes (MOD) LV area d, A4C:    48.50 cm LV area s, A4C:    36.90 cm LV major d, A4C:   9.85 cm LV major s, A4C:   9.63 cm LV vol d, MOD A4C: 196.0 ml LV vol s, MOD A4C: 120.0 ml LV SV MOD A4C:     196.0 ml RIGHT VENTRICLE RV Basal diam:  3.66 cm LEFT ATRIUM              Index       RIGHT ATRIUM           Index LA diam:        5.60 cm  2.50 cm/m  RA Area:     22.90 cm LA Vol (A2C):   74.3 ml  33.22  ml/m RA Volume:   68.80 ml  30.77 ml/m LA Vol (A4C):   116.0 ml 51.87 ml/m LA Biplane Vol: 94.2 ml  42.12 ml/m  AORTIC VALVE                    PULMONIC VALVE AV Area (Vmax):    2.01 cm     PV Vmax:       1.06 m/s AV Area (Vmean):   2.14 cm     PV Vmean:      79.500 cm/s AV Area (VTI):     1.98 cm     PV VTI:        0.215 m AV Vmax:           179.00 cm/s  PV Peak grad:  4.5 mmHg AV Vmean:          120.000 cm/s PV Mean grad:  3.0 mmHg AV VTI:            0.355 m AV Peak Grad:      12.8 mmHg AV Mean Grad:      7.0 mmHg LVOT Vmax:         94.60 cm/s LVOT Vmean:        67.600 cm/s LVOT VTI:          0.185 m LVOT/AV VTI ratio: 0.52  AORTA Ao Root diam: 3.80 cm MITRAL VALVE MV Area (PHT): 3.91 cm              SHUNTS MV Peak grad:  6.6 mmHg              Systemic VTI:  0.18 m MV Mean grad:  2.0 mmHg              Systemic  Diam: 2.20 cm MV Vmax:       1.28 m/s MV Vmean:      68.6 cm/s MV VTI:        0.33 m MV PHT:        56.26 msec MV Decel Time: 194 msec MV E velocity: 128.00 cm/s 103 cm/s MV A velocity: 59.20 cm/s  70.3 cm/s MV E/A ratio:  2.16        1.5  Serafina Royals MD Electronically signed by Serafina Royals MD Signature Date/Time: 06/08/2019/1:15:09 PM    Final     EKG: Normal sinus rhythm with preventricular contractions left atrial enlargement left bundle branch block  Weights: Filed Weights   06/07/19 1408 06/07/19 1825 06/08/19 0330  Weight: 93 kg 92.9 kg 92.8 kg     Physical Exam: Blood pressure 123/61, pulse 71, temperature 98.2 F (36.8 C), temperature source Oral, resp. rate 20, height 6\' 4"  (1.93 m), weight 92.8 kg, SpO2 96 %. Body mass index is 24.89 kg/m. General: Well developed, well nourished, in no acute distress. Head eyes ears nose throat: Normocephalic, atraumatic, sclera non-icteric, no xanthomas, nares are without discharge. No apparent thyromegaly and/or mass  Lungs: Normal respiratory effort.  no wheezes, no rales, no rhonchi.  Heart: RRR with normal S1 S2. no murmur  gallop, no rub, PMI is normal size and placement, carotid upstroke normal without bruit, jugular venous pressure is normal Abdomen: Soft, non-tender, non-distended with normoactive bowel sounds. No hepatomegaly. No rebound/guarding. No obvious abdominal masses. Abdominal aorta is normal size without bruit Extremities: Trace edema. no cyanosis, no clubbing, no ulcers  Peripheral : 2+ bilateral upper extremity pulses, 2+ bilateral femoral pulses, 2+ bilateral dorsal pedal pulse Neuro: Alert and oriented. No facial asymmetry. No focal deficit. Moves all extremities spontaneously. Musculoskeletal: Normal muscle tone without kyphosis Psych:  Responds to questions appropriately with a normal affect.    Assessment: 86 year old male with coronary disease status post coronary to bypass graft hypertension hyperlipidemia anemia chronic kidney disease with acute on chronic systolic dysfunction congestive heart failure and elevated troponin consistent with demand ischemia rather than acute coronary syndrome.  Plan: 1.  Continue intravenous Lasix into tomorrow and possibly changing to oral Lasix thereafter if patient has improved pulmonary edema and less hypoxia 2.  No further cardiac diagnostics necessary at this time 3.  Continue surveillance of hemoglobin anemia chronic kidney disease is contributing to above 4.  Continue medication management for cardiomyopathy including metoprolol and digoxin and would consider the possibility of ACE inhibitor if able more likely as an outpatient 5.  Continue antiplatelet medication management for further risk reduction cardiovascular event and Ranexa for anginal symptoms in the future  Signed, Corey Skains M.D. [facility] 06/08/2019, 5:01 PM

## 2019-06-08 NOTE — Progress Notes (Signed)
PROGRESS NOTE    Andres Gutierrez  TUU:[id number] DOB: [birth year] DOA: 06/07/2019 PCP: Rusty Aus, MD   Brief Narrative:  Andres Gutierrez is a 86 y.o. male with medical history significant of HFrEF(35 to 40%), A. fib, CAD SP CABG, cystic bullous lung disease, anemia and anxiety came to ED with complaint of generalized weakness and worsening shortness of breath.  Denies any chest pain or palpitations.  Denies any orthopnea or PND.  He was worried about COVID-19 infection.  Covid 19 was negative. ED started him on Heparin gtt. due to elevated troponin. BNP elevated with some bilateral basal crackles.  Subjective: Patient was feeling better when seen this morning she was getting his echocardiogram.  No new complaints.  He was accompanied by his daughter in the room.  Assessment & Plan:   Active Problems:   Acute on chronic heart failure (HCC)  Acute on chronic heart failure.  Repeat Echo with reduced ejection fraction to 20 to 25% with global hypokinesia of left ventricle. -Cardiology was consulted-appreciate their recommendations. Elevated troponin most likely secondary to demand. Digoxin subtherapeutic, according to patient and daughter he was regularly taking his meds.  Will need dose adjustment.  I will defer this decision to cardiology. -He was started on heparin gtt. by ED provider-cardiology can decide whether to continue or not. -Continue Lasix 40 mg daily. -Continue daily BMP with strict intake and output and daily weight.  History of CAD.  Patient is high risk for another cardiac event. Repeat echo with worsening of ejection fraction and global hypokinesia. -Appreciate cardiology recommendations. -Continue home dose of metoprolol and ranolazine. -Continue nitroglycerin as needed. -Continue Lipitor. -Aspirin 81 mg daily.  BPH. -Continue home dose of Flomax  Cystic bullous lung disease.  Followed up by [facility] pulmonology. No wheezing at this time. -Continue home  inhalers.  Bilateral calluses.  Found to have bilateral calluses with clean base ulcers on plantar surfaces under first metatarsal.  Imaging negative for any osteomyelitis. Patient being followed by podiatry as an outpatient. -Continue wound care.  Objective: Vitals:   06/07/19 1828 06/07/19 1936 06/08/19 0330 06/08/19 0747  BP: 132/72 110/60 (!) 97/59 124/81  Pulse: 90 (!) 102 71 77  Resp: 19 20 20 19   Temp: 97.8 F (36.6 C) 98.3 F (36.8 C) 98.2 F (36.8 C) 98.2 F (36.8 C)  TempSrc: Oral Oral Oral Oral  SpO2: 97% 98% 96% 98%  Weight:   92.8 kg   Height:        Intake/Output Summary (Last 24 hours) at 06/08/2019 1606 Last data filed at 06/08/2019 1527 Gross per 24 hour  Intake 882.64 ml  Output 1225 ml  Net -342.36 ml   Filed Weights   06/07/19 1408 06/07/19 1825 06/08/19 0330  Weight: 93 kg 92.9 kg 92.8 kg    Examination:  General exam: Appears calm and comfortable  Respiratory system: Clear to auscultation. Respiratory effort normal. Cardiovascular system: S1 & S2 heard, RRR. No JVD, murmurs, rubs, gallops or clicks. Gastrointestinal system: Soft, nontender, nondistended, bowel sounds positive. Central nervous system: Alert and oriented. No focal neurological deficits.Symmetric 5 x 5 power. Extremities: No edema, no cyanosis, pulses intact and symmetrical. Skin: Calluses on bilateral plantar surfaces under great toe with small clean-based ulcerations. Psychiatry: Judgement and insight appear normal. Mood & affect appropriate.    DVT prophylaxis: Heparin Code Status: Full Family Communication: Daughter was updated at bedside. Disposition Plan: Pending improvement and cardiology evaluation.  Consultants:   Cardiology  Procedures:  Antimicrobials:  Data Reviewed: I have personally reviewed following labs and imaging studies  CBC: Recent Labs  Lab 06/07/19 1453 06/08/19 0700  WBC 11.5* 10.4  NEUTROABS 7.1  --   HGB 10.0* 8.8*  HCT 30.9* 27.1*  MCV  104.0* 104.2*  PLT 150 762*   Basic Metabolic Panel: Recent Labs  Lab 06/07/19 1453 06/08/19 0100  NA 138 137  K 4.1 3.8  CL 109 109  CO2 23 21*  GLUCOSE 111* 103*  BUN 32* 32*  CREATININE 1.62* 1.61*  CALCIUM 9.0 8.4*   GFR: Estimated Creatinine Clearance: 40.4 mL/min (A) (by C-G formula based on SCr of 1.61 mg/dL (H)). Liver Function Tests: Recent Labs  Lab 06/07/19 1453  AST 29  ALT 24  ALKPHOS 60  BILITOT 1.0  PROT 7.5  ALBUMIN 4.0   No results for input(s): LIPASE, AMYLASE in the last 168 hours. No results for input(s): AMMONIA in the last 168 hours. Coagulation Profile: Recent Labs  Lab 06/07/19 1619  INR 1.1   Cardiac Enzymes: No results for input(s): CKTOTAL, CKMB, CKMBINDEX, TROPONINI in the last 168 hours. BNP (last 3 results) No results for input(s): PROBNP in the last 8760 hours. HbA1C: No results for input(s): HGBA1C in the last 72 hours. CBG: No results for input(s): GLUCAP in the last 168 hours. Lipid Profile: No results for input(s): CHOL, HDL, LDLCALC, TRIG, CHOLHDL, LDLDIRECT in the last 72 hours. Thyroid Function Tests: No results for input(s): TSH, T4TOTAL, FREET4, T3FREE, THYROIDAB in the last 72 hours. Anemia Panel: No results for input(s): VITAMINB12, FOLATE, FERRITIN, TIBC, IRON, RETICCTPCT in the last 72 hours. Sepsis Labs: No results for input(s): PROCALCITON, LATICACIDVEN in the last 168 hours.  Recent Results (from the past 240 hour(s))  Respiratory Panel by RT PCR (Flu A&B, Covid) - Nasopharyngeal Swab     Status: None   Collection Time: 06/07/19  2:53 PM   Specimen: Nasopharyngeal Swab  Result Value Ref Range Status   SARS Coronavirus 2 by RT PCR NEGATIVE NEGATIVE Final    Comment: (NOTE) SARS-CoV-2 target nucleic acids are NOT DETECTED. The SARS-CoV-2 RNA is generally detectable in upper respiratoy specimens during the acute phase of infection. The lowest concentration of SARS-CoV-2 viral copies this assay can detect  is 131 copies/mL. A negative result does not preclude SARS-Cov-2 infection and should not be used as the sole basis for treatment or other patient management decisions. A negative result may occur with  improper specimen collection/handling, submission of specimen other than nasopharyngeal swab, presence of viral mutation(s) within the areas targeted by this assay, and inadequate number of viral copies (<131 copies/mL). A negative result must be combined with clinical observations, patient history, and epidemiological information. The expected result is Negative. Fact Sheet for Patients:  PinkCheek.be Fact Sheet for Healthcare Providers:  GravelBags.it This test is not yet ap proved or cleared by the [place] FDA and  has been authorized for detection and/or diagnosis of SARS-CoV-2 by FDA under an Emergency Use Authorization (EUA). This EUA will remain  in effect (meaning this test can be used) for the duration of the COVID-19 declaration under Section 564(b)(1) of the Act, 21 U.S.C. section 360bbb-3(b)(1), unless the authorization is terminated or revoked sooner.    Influenza A by PCR NEGATIVE NEGATIVE Final   Influenza B by PCR NEGATIVE NEGATIVE Final    Comment: (NOTE) The Xpert Xpress SARS-CoV-2/FLU/RSV assay is intended as an aid in  the diagnosis of influenza from Nasopharyngeal swab specimens and  should not  be used as a sole basis for treatment. Nasal washings and  aspirates are unacceptable for Xpert Xpress SARS-CoV-2/FLU/RSV  testing. Fact Sheet for Patients: PinkCheek.be Fact Sheet for Healthcare Providers: GravelBags.it This test is not yet approved or cleared by the [place] FDA and  has been authorized for detection and/or diagnosis of SARS-CoV-2 by  FDA under an Emergency Use Authorization (EUA). This EUA will remain  in effect (meaning this  test can be used) for the duration of the  Covid-19 declaration under Section 564(b)(1) of the Act, 21  U.S.C. section 360bbb-3(b)(1), unless the authorization is  terminated or revoked. Performed at [facility], [address], [place], [place] [address]      Radiology Studies: DG Chest Portable 1 View  Result Date: 06/07/2019 CLINICAL DATA:  Shortness of breath for 3 weeks, weakness for 1 week, history of CHF, cardiomyopathy, coronary artery disease post MI, rare lung disease with "holes in his lungs", atrial fibrillation EXAM: PORTABLE CHEST 1 VIEW COMPARISON:  Portable exam 1417 hours compared to 05/18/2019 FINDINGS: Enlargement of cardiac silhouette post median sternotomy. Pulmonary vascular congestion. Mild scattered interstitial infiltrates favoring pulmonary edema. No pleural effusion or pneumothorax. Osseous structures unremarkable. IMPRESSION: Enlargement of cardiac silhouette with pulmonary vascular congestion and probable mild pulmonary edema. Electronically Signed   By: Lavonia Dana M.D.   On: 06/07/2019 14:49   DG Foot Complete Left  Result Date: 06/08/2019 CLINICAL DATA:  "Bilateral foot infections". EXAM: LEFT FOOT - COMPLETE 3+ VIEW COMPARISON:  None. FINDINGS: First metatarsophalangeal advanced osteoarthritis, secondary to hallux valgus deformity. Remote fractures of the second through fourth metatarsals. No acute fracture or dislocation. Small calcaneal spur. No well-defined soft tissue ulcer. No soft tissue gas. No osseous destruction. IMPRESSION: No acute osseous abnormality. Electronically Signed   By: Abigail Miyamoto M.D.   On: 06/08/2019 14:34   DG Foot Complete Right  Result Date: 06/08/2019 CLINICAL DATA:  Foot infection EXAM: RIGHT FOOT COMPLETE - 3+ VIEW COMPARISON:  A 09/2015 FINDINGS: Chronic fracture second metatarsal with exuberant chronic periosteal bone formation. Single screw in the fracture. Mild displacement of fracture, chronic and unchanged No acute  fracture.  Mild hallux valgus.  No erosion. Mild arterial calcification.  Negative for soft tissue swelling. IMPRESSION: Chronic fracture deformity second metatarsal unchanged. Hallux valgus with degenerative change. No acute abnormality. Electronically Signed   By: Franchot Gallo M.D.   On: 06/08/2019 14:35   ECHOCARDIOGRAM COMPLETE  Result Date: 06/08/2019   ECHOCARDIOGRAM REPORT   Patient Name:   Andres Gutierrez Date of Exam: 06/08/2019 Medical Rec #:  [id number]    Height:       76.0 in Accession #:    [id number]   Weight:       204.5 lb Date of Birth:  June 15, 1930    BSA:          2.24 m Patient Age:    85 years     BP:           124/81 mmHg Patient Gender: M            HR:           68 bpm. Exam Location:  [facility] Procedure: 2D Echo, Color Doppler and Cardiac Doppler Indications:     R06.00 Dyspnea  History:         Patient has prior history of Echocardiogram examinations, most                  recent  01/08/2019. CHF and Cardiomyopathy, CAD, Prior CABG, CKD;                  Risk Factors:Hypertension.  Sonographer:     Charmayne Sheer RDCS (AE) Referring Phys:  [id number] [facility] Sayeed Weatherall Diagnosing Phys: Serafina Royals MD IMPRESSIONS  1. Left ventricular ejection fraction, by visual estimation, is 20 to 25%. The left ventricle has severely decreased function. There is no left ventricular hypertrophy.  2. Moderately dilated left ventricular internal cavity size.  3. The left ventricle demonstrates global hypokinesis.  4. Global right ventricle has normal systolic function.The right ventricular size is normal. No increase in right ventricular wall thickness.  5. Left atrial size was mildly dilated.  6. Right atrial size was mildly dilated.  7. The mitral valve is normal in structure. Moderate mitral valve regurgitation.  8. The tricuspid valve is normal in structure.  9. The tricuspid valve is normal in structure. Tricuspid valve regurgitation is moderate. 10. The aortic valve is normal in structure. Aortic valve regurgitation  is trivial. 11. The pulmonic valve was normal in structure. Pulmonic valve regurgitation is trivial. FINDINGS  Left Ventricle: Left ventricular ejection fraction, by visual estimation, is 20 to 25%. The left ventricle has severely decreased function. The left ventricle demonstrates global hypokinesis. The left ventricular internal cavity size was moderately dilated left ventricle. There is no left ventricular hypertrophy. Right Ventricle: The right ventricular size is normal. No increase in right ventricular wall thickness. Global RV systolic function is has normal systolic function. Left Atrium: Left atrial size was mildly dilated. Right Atrium: Right atrial size was mildly dilated Pericardium: There is no evidence of pericardial effusion. Mitral Valve: The mitral valve is normal in structure. Moderate mitral valve regurgitation. MV peak gradient, 6.6 mmHg. Tricuspid Valve: The tricuspid valve is normal in structure. Tricuspid valve regurgitation is moderate. Aortic Valve: The aortic valve is normal in structure. Aortic valve regurgitation is trivial. Aortic valve mean gradient measures 7.0 mmHg. Aortic valve peak gradient measures 12.8 mmHg. Aortic valve area, by VTI measures 1.98 cm. Pulmonic Valve: The pulmonic valve was normal in structure. Pulmonic valve regurgitation is trivial. Pulmonic regurgitation is trivial. Aorta: The aortic root, ascending aorta and aortic arch are all structurally normal, with no evidence of dilitation or obstruction. IAS/Shunts: No atrial level shunt detected by color flow Doppler.  LEFT VENTRICLE PLAX 2D LVIDd:         5.81 cm       Diastology LVIDs:         5.22 cm       LV e' medial:   5.77 cm/s LV PW:         1.25 cm       LV E/e' medial: 22.2 LV IVS:        0.88 cm LVOT diam:     2.20 cm LV SV:         37 ml LV SV Index:   16.37 LVOT Area:     3.80 cm  LV Volumes (MOD) LV area d, A4C:    48.50 cm LV area s, A4C:    36.90 cm LV major d, A4C:   9.85 cm LV major s, A4C:   9.63  cm LV vol d, MOD A4C: 196.0 ml LV vol s, MOD A4C: 120.0 ml LV SV MOD A4C:     196.0 ml RIGHT VENTRICLE RV Basal diam:  3.66 cm LEFT ATRIUM  Index       RIGHT ATRIUM           Index LA diam:        5.60 cm  2.50 cm/m  RA Area:     22.90 cm LA Vol (A2C):   74.3 ml  33.22 ml/m RA Volume:   68.80 ml  30.77 ml/m LA Vol (A4C):   116.0 ml 51.87 ml/m LA Biplane Vol: 94.2 ml  42.12 ml/m  AORTIC VALVE                    PULMONIC VALVE AV Area (Vmax):    2.01 cm     PV Vmax:       1.06 m/s AV Area (Vmean):   2.14 cm     PV Vmean:      79.500 cm/s AV Area (VTI):     1.98 cm     PV VTI:        0.215 m AV Vmax:           179.00 cm/s  PV Peak grad:  4.5 mmHg AV Vmean:          120.000 cm/s PV Mean grad:  3.0 mmHg AV VTI:            0.355 m AV Peak Grad:      12.8 mmHg AV Mean Grad:      7.0 mmHg LVOT Vmax:         94.60 cm/s LVOT Vmean:        67.600 cm/s LVOT VTI:          0.185 m LVOT/AV VTI ratio: 0.52  AORTA Ao Root diam: 3.80 cm MITRAL VALVE MV Area (PHT): 3.91 cm              SHUNTS MV Peak grad:  6.6 mmHg              Systemic VTI:  0.18 m MV Mean grad:  2.0 mmHg              Systemic Diam: 2.20 cm MV Vmax:       1.28 m/s MV Vmean:      68.6 cm/s MV VTI:        0.33 m MV PHT:        56.26 msec MV Decel Time: 194 msec MV E velocity: 128.00 cm/s 103 cm/s MV A velocity: 59.20 cm/s  70.3 cm/s MV E/A ratio:  2.16        1.5  Serafina Royals MD Electronically signed by Serafina Royals MD Signature Date/Time: 06/08/2019/1:15:09 PM    Final     Scheduled Meds: . aspirin  324 mg Oral Once  . aspirin EC  81 mg Oral Daily  . atorvastatin  40 mg Oral q1800  . digoxin  0.0625 mg Oral Daily  . fluticasone furoate-vilanterol  1 puff Inhalation Daily   And  . umeclidinium bromide  1 puff Inhalation Daily  . furosemide  40 mg Intravenous Daily  . metoprolol tartrate  12.5 mg Oral BID  . ranolazine  500 mg Oral BID  . sodium chloride flush  3 mL Intravenous Q12H  . tamsulosin  0.4 mg Oral QPC supper    Continuous Infusions: . sodium chloride    . heparin 1,200 Units/hr (06/08/19 1527)     LOS: 1 day   Time spent: 40 minutes.  Lorella Nimrod, MD [facility] Pager [phone]  If 7PM-7AM, please contact night-coverage [email or web address] Password  [facility] 06/08/2019, 4:06 PM   This record has been created using Systems analyst. Errors have been sought and corrected,but may not always be located. Such creation errors do not reflect on the standard of care.

## 2019-06-08 NOTE — Consult Note (Signed)
WOC Nurse Consult Note: Reason for Consult: Bilateral nonintact callous to bilateral plantar feet at great toe metatarsal heads.  Wound type: Neuropathic ulcer Pressure Injury POA: Yes sees podiatry Dr Vickki Muff.  Next appointment 06/16/19 Measurement: 0.3 cm callous to bilateral great toes with 0.1 cm nonintact center Wound FFM:BWGY pink Drainage (amount, consistency, odor) dry Periwound: intact Dressing procedure/placement/frequency: Cleanse callous with soap and water and pat dry. Apply padded foam dressing and change daily.  Has offloading orthotic shoe.  Will not follow at this time.  Please re-consult if needed.  Domenic Moras MSN, RN, FNP-BC CWON Wound, Ostomy, Continence Nurse Pager [phone]

## 2019-06-08 NOTE — Progress Notes (Signed)
Pt ambulated in hall , tolerated well, is now comfortably laying in bed with call light in reach and no needs expressed

## 2019-06-08 NOTE — Progress Notes (Signed)
[place] for Heparin Indication: chest pain/ACS  Allergies  Allergen Reactions  . 5ht3 Receptor Antagonists   . Diphenhydramine Hcl Other (See Comments)    Reaction:  Rash and fever a long time ago, but has had it since with no problem.  . Escitalopram Other (See Comments)    Reaction:  Makes him feel faint, like he was going to have a heart attack.  . Maxidex [Dexamethasone] Other (See Comments)    Reaction:  Unknown   . Prednisone Other (See Comments)    Pt states that this medication made him feel crazy.    . Serotonin Other (See Comments)    Tried 2 different types, lexapro and another one.  Reaction:  Made him feel like he was having a heart attack.   . Vytorin [Ezetimibe-Simvastatin] Other (See Comments)    Reaction:  Unknown   . Zocor [Simvastatin] Other (See Comments)    Reaction:  Unknown   . Diltiazem Rash    Patient Measurements: Height: 6\' 4"  (193 cm) Weight: 204 lb 8 oz (92.8 kg) IBW/kg (Calculated) : 86.8 Heparin Dosing Weight: 93 kg  Vital Signs: Temp: 98.2 F (36.8 C) (01/25 0747) Temp Source: Oral (01/25 0747) BP: 124/81 (01/25 0747) Pulse Rate: 77 (01/25 0747)  Labs: Recent Labs    06/07/19 1453 06/07/19 1619 06/07/19 1837 06/08/19 0100 06/08/19 0700 06/08/19 0943  HGB 10.0*  --   --   --  8.8*  --   HCT 30.9*  --   --   --  27.1*  --   PLT 150  --   --   --  142*  --   APTT  --  34  --   --   --   --   LABPROT  --  13.6  --   --   --   --   INR  --  1.1  --   --   --   --   HEPARINUNFRC  --   --   --  0.26*  --  0.48  CREATININE 1.62*  --   --  1.61*  --   --   TROPONINIHS 920*  --  870*  --   --   --     Estimated Creatinine Clearance: 40.4 mL/min (A) (by C-G formula based on SCr of 1.61 mg/dL (H)).   Medical History: Past Medical History:  Diagnosis Date  . Anemia   . Anxiety   . Atrial fibrillation ([place])   . Cardiomyopathy ([place])   . CHF (congestive heart failure) ([place])   . Chronic kidney  disease    kidney stones  . Coronary artery disease   . Cough   . Dysrhythmia   . Hypertension   . Lymphadenopathy, hilar   . MI (myocardial infarction) ([place])    x 2  . Wheezing     Assessment: 86 yo male with anemia, anxiety, A. Fib, CHF, prior MIs comes in for weakness and SOB.  Troponin markedly elevated to 920   Goal of Therapy:  Heparin level 0.3-0.7 units/ml Monitor platelets by anticoagulation protocol: Yes   Plan:  1/25 0943 HL 0.48 therapeutic. Continue heparin drip at 1200 units/hr. HL at 1800 to confirm. Continue to follow plan for heparin. CBC daily while on heparin drip.  Tawnya Crook PharmD 06/08/2019,11:04 AM

## 2019-06-08 NOTE — Progress Notes (Signed)
ANTICOAGULATION CONSULT NOTE - Initial Consult  Pharmacy Consult for Heparin Indication: chest pain/ACS  Allergies  Allergen Reactions  . 5ht3 Receptor Antagonists   . Diphenhydramine Hcl Other (See Comments)    Reaction:  Rash and fever a long time ago, but has had it since with no problem.  . Escitalopram Other (See Comments)    Reaction:  Makes him feel faint, like he was going to have a heart attack.  . Maxidex [Dexamethasone] Other (See Comments)    Reaction:  Unknown   . Prednisone Other (See Comments)    Pt states that this medication made him feel crazy.    . Serotonin Other (See Comments)    Tried 2 different types, lexapro and another one.  Reaction:  Made him feel like he was having a heart attack.   . Vytorin [Ezetimibe-Simvastatin] Other (See Comments)    Reaction:  Unknown   . Zocor [Simvastatin] Other (See Comments)    Reaction:  Unknown   . Diltiazem Rash    Patient Measurements: Height: 6\' 4"  (193 cm) Weight: 204 lb 14.4 oz (92.9 kg) IBW/kg (Calculated) : 86.8 Heparin Dosing Weight: 93 kg  Vital Signs: Temp: 98.3 F (36.8 C) (01/24 1936) Temp Source: Oral (01/24 1936) BP: 110/60 (01/24 1936) Pulse Rate: 102 (01/24 1936)  Labs: Recent Labs    06/07/19 1453 06/07/19 1619 06/07/19 1837 06/08/19 0100  HGB 10.0*  --   --   --   HCT 30.9*  --   --   --   PLT 150  --   --   --   APTT  --  34  --   --   LABPROT  --  13.6  --   --   INR  --  1.1  --   --   HEPARINUNFRC  --   --   --  0.26*  CREATININE 1.62*  --   --  1.61*  TROPONINIHS 920*  --  870*  --     Estimated Creatinine Clearance: 40.4 mL/min (A) (by C-G formula based on SCr of 1.61 mg/dL (H)).   Medical History: Past Medical History:  Diagnosis Date  . Anemia   . Anxiety   . Atrial fibrillation ([place])   . Cardiomyopathy ([place])   . CHF (congestive heart failure) ([place])   . Chronic kidney disease    kidney stones  . Coronary artery disease   . Cough   . Dysrhythmia   . Hypertension    . Lymphadenopathy, hilar   . MI (myocardial infarction) ([place])    x 2  . Wheezing     Medications:  Infusions:  . sodium chloride    . heparin 1,000 Units/hr (06/07/19 1701)    Assessment: 86 yo male with anemia, anxiety, A. Fib, CHF, prior MIs comes in for weakness and SOB.  Troponin markedly elevated to 920  1/25 @ 0100 HL = 0.26, subtherapeutic just barely. Will increase Heparin infusion to 1200 units/hr and recheck HL in 8 hours  Goal of Therapy:  Heparin level 0.3-0.7 units/ml Monitor platelets by anticoagulation protocol: Yes   Plan:  Give 4000 units bolus x 1 Start heparin infusion at 1000 units/hr Check anti-Xa level in 8 hours and daily while on heparin Continue to monitor H&H and platelets   1/25:  Addendum:  Increase Heparin to 1200 units/hr and recheck HL in 8 hours  Hart Robinsons A PharmD 06/08/2019,1:24 AM

## 2019-06-09 ENCOUNTER — Telehealth: Payer: Self-pay | Admitting: Internal Medicine

## 2019-06-09 ENCOUNTER — Encounter: Payer: Self-pay | Admitting: Internal Medicine

## 2019-06-09 DIAGNOSIS — I5043 Acute on chronic combined systolic (congestive) and diastolic (congestive) heart failure: Secondary | ICD-10-CM

## 2019-06-09 LAB — BASIC METABOLIC PANEL
Anion gap: 8 (ref 5–15)
BUN: 40 mg/dL — ABNORMAL HIGH (ref 8–23)
CO2: 23 mmol/L (ref 22–32)
Calcium: 8.6 mg/dL — ABNORMAL LOW (ref 8.9–10.3)
Chloride: 107 mmol/L (ref 98–111)
Creatinine, Ser: 1.62 mg/dL — ABNORMAL HIGH (ref 0.61–1.24)
GFR calc Af Amer: 44 mL/min — ABNORMAL LOW (ref >60)
GFR calc non Af Amer: 38 mL/min — ABNORMAL LOW (ref >60)
Glucose, Bld: 95 mg/dL (ref 70–99)
Potassium: 4 mmol/L (ref 3.5–5.1)
Sodium: 138 mmol/L (ref 135–145)

## 2019-06-09 LAB — CBC
HCT: 28.8 % — ABNORMAL LOW (ref 39.0–52.0)
Hemoglobin: 9.4 g/dL — ABNORMAL LOW (ref 13.0–17.0)
MCH: 33.6 pg (ref 26.0–34.0)
MCHC: 32.6 g/dL (ref 30.0–36.0)
MCV: 102.9 fL — ABNORMAL HIGH (ref 80.0–100.0)
Platelets: 144 10*3/uL — ABNORMAL LOW (ref 150–400)
RBC: 2.8 MIL/uL — ABNORMAL LOW (ref 4.22–5.81)
RDW: 15.9 % — ABNORMAL HIGH (ref 11.5–15.5)
WBC: 13.8 10*3/uL — ABNORMAL HIGH (ref 4.0–10.5)
nRBC: 0 % (ref 0.0–0.2)

## 2019-06-09 LAB — HEPARIN LEVEL (UNFRACTIONATED): Heparin Unfractionated: 0.63 IU/mL (ref 0.30–0.70)

## 2019-06-09 MED ORDER — FUROSEMIDE 40 MG PO TABS: 40.0000 mg | ORAL_TABLET | Freq: Every day | ORAL | 3 refills | Status: DC

## 2019-06-09 MED ORDER — FUROSEMIDE 40 MG PO TABS
40.0000 mg | ORAL_TABLET | Freq: Every day | ORAL | Status: DC
  Administered 2019-06-09: 40 mg via ORAL
  Filled 2019-06-09: qty 1

## 2019-06-09 MED ORDER — ASPIRIN 81 MG PO TBEC: 81.0000 mg | DELAYED_RELEASE_TABLET | Freq: Every day | ORAL | Status: DC

## 2019-06-09 NOTE — Progress Notes (Signed)
[facility] Cardiology [facility] Encounter Note  Patient: Andres Gutierrez / Admit Date: 06/07/2019 / Date of Encounter: 06/09/2019, 8:43 AM   Subjective: Patient feeling much better today.  Less shortness of breath weakness and fatigue.  No evidence of chest pain throughout hospitalization.  Patient tolerating current medical regimen without evidence of significant symptoms.  Troponin peaked 920 not consistent with demand ischemia rather than acute coronary syndrome.  Patient is hemodynamically stable today  Review of Systems: Positive for: Shortness of breath Negative for: Vision change, hearing change, syncope, dizziness, nausea, vomiting,diarrhea, bloody stool, stomach pain, cough, congestion, diaphoresis, urinary frequency, urinary pain,skin lesions, skin rashes Others previously listed  Objective: Telemetry: Normal sinus rhythm Physical Exam: Blood pressure 123/67, pulse 78, temperature 97.7 F (36.5 C), temperature source Oral, resp. rate 18, height 6\' 4"  (1.93 m), weight 91.8 kg, SpO2 96 %. Body mass index is 24.64 kg/m. General: Well developed, well nourished, in no acute distress. Head: Normocephalic, atraumatic, sclera non-icteric, no xanthomas, nares are without discharge. Neck: No apparent masses Lungs: Normal respirations with no wheezes, no rhonchi, no rales , no crackles   Heart: Regular rate and rhythm, normal S1 S2, no murmur, no rub, no gallop, PMI is normal size and placement, carotid upstroke normal without bruit, jugular venous pressure normal Abdomen: Soft, non-tender, non-distended with normoactive bowel sounds. No hepatosplenomegaly. Abdominal aorta is normal size without bruit Extremities: No edema, no clubbing, no cyanosis, no ulcers,  Peripheral: 2+ radial, 2+ femoral, 2+ dorsal pedal pulses Neuro: Alert and oriented. Moves all extremities spontaneously. Psych:  Responds to questions appropriately with a normal affect.   Intake/Output Summary (Last 24 hours) at  06/09/2019 0843 Last data filed at 06/09/2019 0320 Gross per 24 hour  Intake 962.33 ml  Output 2025 ml  Net -1062.67 ml    Inpatient Medications:  . aspirin EC  81 mg Oral Daily  . atorvastatin  40 mg Oral q1800  . digoxin  0.0625 mg Oral Daily  . fluticasone furoate-vilanterol  1 puff Inhalation Daily   And  . umeclidinium bromide  1 puff Inhalation Daily  . furosemide  40 mg Oral Daily  . metoprolol tartrate  12.5 mg Oral BID  . ranolazine  500 mg Oral BID  . sodium chloride flush  3 mL Intravenous Q12H  . tamsulosin  0.4 mg Oral QPC supper   Infusions:  . sodium chloride    . heparin 1,200 Units/hr (06/09/19 0631)    Labs: Recent Labs    06/08/19 0100 06/09/19 0549  NA 137 138  K 3.8 4.0  CL 109 107  CO2 21* 23  GLUCOSE 103* 95  BUN 32* 40*  CREATININE 1.61* 1.62*  CALCIUM 8.4* 8.6*   Recent Labs    06/07/19 1453  AST 29  ALT 24  ALKPHOS 60  BILITOT 1.0  PROT 7.5  ALBUMIN 4.0   Recent Labs    06/07/19 1453 06/07/19 1453 06/08/19 0700 06/09/19 0549  WBC 11.5*   < > 10.4 13.8*  NEUTROABS 7.1  --   --   --   HGB 10.0*   < > 8.8* 9.4*  HCT 30.9*   < > 27.1* 28.8*  MCV 104.0*   < > 104.2* 102.9*  PLT 150   < > 142* 144*   < > = values in this interval not displayed.   No results for input(s): CKTOTAL, CKMB, TROPONINI in the last 72 hours. Invalid input(s): POCBNP No results for input(s): HGBA1C in the last  72 hours.   Weights: Filed Weights   06/07/19 1825 06/08/19 0330 06/09/19 0322  Weight: 92.9 kg 92.8 kg 91.8 kg     Radiology/Studies:  DG Chest 2 View  Result Date: 05/18/2019 CLINICAL DATA:  Dyspnea EXAM: CHEST - 2 VIEW COMPARISON:  02/25/2017 chest radiograph. FINDINGS: Stable discontinuities in the second and third superior most sternotomy wires. CABG clips overlie the mediastinum. Stable cardiomediastinal silhouette with normal heart size. No pneumothorax. No pleural effusion. No pulmonary edema. Scattered chronic mild reticular  opacities at both lung bases are mildly increased. No acute consolidative airspace disease. Chronic mild bullous emphysema at the left lung apex. Hyperinflated lungs. IMPRESSION: 1. No acute cardiopulmonary disease. 2. Chronic mild bullous emphysema at the left lung apex. Hyperinflated lungs suggest COPD. 3. Chronic mild scattered reticular opacities at the lung bases, mildly increased, favor mild scarring or atelectasis. Electronically Signed   By: Ilona Sorrel M.D.   On: 05/18/2019 11:52   DG Chest Portable 1 View  Result Date: 06/07/2019 CLINICAL DATA:  Shortness of breath for 3 weeks, weakness for 1 week, history of CHF, cardiomyopathy, coronary artery disease post MI, rare lung disease with "holes in his lungs", atrial fibrillation EXAM: PORTABLE CHEST 1 VIEW COMPARISON:  Portable exam 1417 hours compared to 05/18/2019 FINDINGS: Enlargement of cardiac silhouette post median sternotomy. Pulmonary vascular congestion. Mild scattered interstitial infiltrates favoring pulmonary edema. No pleural effusion or pneumothorax. Osseous structures unremarkable. IMPRESSION: Enlargement of cardiac silhouette with pulmonary vascular congestion and probable mild pulmonary edema. Electronically Signed   By: Lavonia Dana M.D.   On: 06/07/2019 14:49   DG Foot Complete Left  Result Date: 06/08/2019 CLINICAL DATA:  "Bilateral foot infections". EXAM: LEFT FOOT - COMPLETE 3+ VIEW COMPARISON:  None. FINDINGS: First metatarsophalangeal advanced osteoarthritis, secondary to hallux valgus deformity. Remote fractures of the second through fourth metatarsals. No acute fracture or dislocation. Small calcaneal spur. No well-defined soft tissue ulcer. No soft tissue gas. No osseous destruction. IMPRESSION: No acute osseous abnormality. Electronically Signed   By: Abigail Miyamoto M.D.   On: 06/08/2019 14:34   DG Foot Complete Right  Result Date: 06/08/2019 CLINICAL DATA:  Foot infection EXAM: RIGHT FOOT COMPLETE - 3+ VIEW COMPARISON:   A 09/2015 FINDINGS: Chronic fracture second metatarsal with exuberant chronic periosteal bone formation. Single screw in the fracture. Mild displacement of fracture, chronic and unchanged No acute fracture.  Mild hallux valgus.  No erosion. Mild arterial calcification.  Negative for soft tissue swelling. IMPRESSION: Chronic fracture deformity second metatarsal unchanged. Hallux valgus with degenerative change. No acute abnormality. Electronically Signed   By: Franchot Gallo M.D.   On: 06/08/2019 14:35   ECHOCARDIOGRAM COMPLETE  Result Date: 06/08/2019   ECHOCARDIOGRAM REPORT   Patient Name:   NICKLOUS ABURTO Date of Exam: 06/08/2019 Medical Rec #:  [id number]    Height:       76.0 in Accession #:    [id number]   Weight:       204.5 lb Date of Birth:  [birth year]    BSA:          2.24 m Patient Age:    8 years     BP:           124/81 mmHg Patient Gender: M            HR:           68 bpm. Exam Location:  [facility] Procedure: 2D Echo, Color Doppler and Cardiac Doppler Indications:  R06.00 Dyspnea  History:         Patient has prior history of Echocardiogram examinations, most                  recent 01/08/2019. CHF and Cardiomyopathy, CAD, Prior CABG, CKD;                  Risk Factors:Hypertension.  Sonographer:     Charmayne Sheer RDCS (AE) Referring Phys:  [id number] [facility] AMIN Diagnosing Phys: Serafina Royals MD IMPRESSIONS  1. Left ventricular ejection fraction, by visual estimation, is 20 to 25%. The left ventricle has severely decreased function. There is no left ventricular hypertrophy.  2. Moderately dilated left ventricular internal cavity size.  3. The left ventricle demonstrates global hypokinesis.  4. Global right ventricle has normal systolic function.The right ventricular size is normal. No increase in right ventricular wall thickness.  5. Left atrial size was mildly dilated.  6. Right atrial size was mildly dilated.  7. The mitral valve is normal in structure. Moderate mitral valve regurgitation.  8. The  tricuspid valve is normal in structure.  9. The tricuspid valve is normal in structure. Tricuspid valve regurgitation is moderate. 10. The aortic valve is normal in structure. Aortic valve regurgitation is trivial. 11. The pulmonic valve was normal in structure. Pulmonic valve regurgitation is trivial. FINDINGS  Left Ventricle: Left ventricular ejection fraction, by visual estimation, is 20 to 25%. The left ventricle has severely decreased function. The left ventricle demonstrates global hypokinesis. The left ventricular internal cavity size was moderately dilated left ventricle. There is no left ventricular hypertrophy. Right Ventricle: The right ventricular size is normal. No increase in right ventricular wall thickness. Global RV systolic function is has normal systolic function. Left Atrium: Left atrial size was mildly dilated. Right Atrium: Right atrial size was mildly dilated Pericardium: There is no evidence of pericardial effusion. Mitral Valve: The mitral valve is normal in structure. Moderate mitral valve regurgitation. MV peak gradient, 6.6 mmHg. Tricuspid Valve: The tricuspid valve is normal in structure. Tricuspid valve regurgitation is moderate. Aortic Valve: The aortic valve is normal in structure. Aortic valve regurgitation is trivial. Aortic valve mean gradient measures 7.0 mmHg. Aortic valve peak gradient measures 12.8 mmHg. Aortic valve area, by VTI measures 1.98 cm. Pulmonic Valve: The pulmonic valve was normal in structure. Pulmonic valve regurgitation is trivial. Pulmonic regurgitation is trivial. Aorta: The aortic root, ascending aorta and aortic arch are all structurally normal, with no evidence of dilitation or obstruction. IAS/Shunts: No atrial level shunt detected by color flow Doppler.  LEFT VENTRICLE PLAX 2D LVIDd:         5.81 cm       Diastology LVIDs:         5.22 cm       LV e' medial:   5.77 cm/s LV PW:         1.25 cm       LV E/e' medial: 22.2 LV IVS:        0.88 cm LVOT diam:      2.20 cm LV SV:         37 ml LV SV Index:   16.37 LVOT Area:     3.80 cm  LV Volumes (MOD) LV area d, A4C:    48.50 cm LV area s, A4C:    36.90 cm LV major d, A4C:   9.85 cm LV major s, A4C:   9.63 cm LV vol d, MOD A4C: 196.0 ml LV  vol s, MOD A4C: 120.0 ml LV SV MOD A4C:     196.0 ml RIGHT VENTRICLE RV Basal diam:  3.66 cm LEFT ATRIUM              Index       RIGHT ATRIUM           Index LA diam:        5.60 cm  2.50 cm/m  RA Area:     22.90 cm LA Vol (A2C):   74.3 ml  33.22 ml/m RA Volume:   68.80 ml  30.77 ml/m LA Vol (A4C):   116.0 ml 51.87 ml/m LA Biplane Vol: 94.2 ml  42.12 ml/m  AORTIC VALVE                    PULMONIC VALVE AV Area (Vmax):    2.01 cm     PV Vmax:       1.06 m/s AV Area (Vmean):   2.14 cm     PV Vmean:      79.500 cm/s AV Area (VTI):     1.98 cm     PV VTI:        0.215 m AV Vmax:           179.00 cm/s  PV Peak grad:  4.5 mmHg AV Vmean:          120.000 cm/s PV Mean grad:  3.0 mmHg AV VTI:            0.355 m AV Peak Grad:      12.8 mmHg AV Mean Grad:      7.0 mmHg LVOT Vmax:         94.60 cm/s LVOT Vmean:        67.600 cm/s LVOT VTI:          0.185 m LVOT/AV VTI ratio: 0.52  AORTA Ao Root diam: 3.80 cm MITRAL VALVE MV Area (PHT): 3.91 cm              SHUNTS MV Peak grad:  6.6 mmHg              Systemic VTI:  0.18 m MV Mean grad:  2.0 mmHg              Systemic Diam: 2.20 cm MV Vmax:       1.28 m/s MV Vmean:      68.6 cm/s MV VTI:        0.33 m MV PHT:        56.26 msec MV Decel Time: 194 msec MV E velocity: 128.00 cm/s 103 cm/s MV A velocity: 59.20 cm/s  70.3 cm/s MV E/A ratio:  2.16        1.5  Serafina Royals MD Electronically signed by Serafina Royals MD Signature Date/Time: 06/08/2019/1:15:09 PM    Final      Assessment and Recommendation  86 y.o. male with known acute on chronic systolic dysfunction congestive heart failure with ejection fraction of 20 to 25% with elevated troponin consistent with demand ischemia without evidence of acute coronary syndrome and contributed  by chronic kidney disease and anemia now improved after diuretics 1.  Discontinue oral Lasix for acute on chronic systolic dysfunction congestive heart failure without change today 2.  Digoxin metoprolol for cardiomyopathy congestive heart failure and maintenance of normal sinus rhythm 3.  No additional cardiac diagnostics necessary at this time 4.  Okay for ambulation and follow for improvement of symptoms.  If  improved today and feeling well okay for discharge home from cardiac standpoint with follow-up next week for further adjustments of medication management  Signed, Serafina Royals M.D. FACC

## 2019-06-09 NOTE — Telephone Encounter (Signed)
Patient's daughter phoned and rescheduled patient's 06-10-19 appts to 06-16-19 as patient was just released from the hospital and would not be able to attend appt. Patient has other appts in February. Message sent to MD scheduler to phone to reschedule that appt according to treatment plan.

## 2019-06-09 NOTE — Discharge Summary (Signed)
Physician Discharge Summary  PADRAIG NHAN MBT:[id number] DOB: June 05, 1930 DOA: 06/07/2019  PCP: Rusty Aus, MD  Admit date: 06/07/2019 Discharge date: 06/09/2019  Admitted From: Home  Disposition:  Home   Recommendations for Outpatient Follow-up:  1. Follow up with Cardiology, Dr. Ubaldo Glassing in 1 week 2. Dr. Ubaldo Glassing: Please obtain BMP in one week and adjust Lasix as needed     Home Health: None  Equipment/Devices: None  Discharge Condition: Good  CODE STATUS: FULL Diet recommendation: Cardiac  Brief/Interim Summary: Mr. Gallaga is an 86 y.o. M with CHF EF 35-40%, CAD s/p CABG, cystic bullous lung disease and anxiety who presented with weakness and SOB for 2-3 weeks.  In the ER, CXR showed mild pulmonary edema.  BNP elevated.  Troponin slightly elevated.  Was started on heparin for NSTEMI and hospitalist service were asked to evaluate.        PRINCIPAL HOSPITAL DIAGNOSIS: Acute on chronic systolic CHF    Discharge Diagnoses:  Acute on chronic systolic CHF Patient admitted with edema on CXR, elevated BNP, DOE.  Started on IV Lasix.  Echo showed reduced EF, now down to 20-25%.  Diuresed 1.3L on admission, Cr stable, K normal, symptoms resolved.  Cardiology were consulted and recommended Lasix 40 mg daily on discharge.  Follow up BMP and Cardiology appointment in 1 week.     Coronary artery disease Ischemic cardiomyopathy EF now down to 20-25%.  Continue atorvastatin, metoprolol, digoxin and ranolazine. Start baby aspirin.  Tachycardia Patient had some brief sinus tachycardia with exertion, resolved with rest.  No Afib on monitor.  Has chart history Afib, but patient denies this, Cardiology do not treat him for Afib, suspect this is spurious.  Lung disease, bullous emphysema Continue Trelegy  BPH Continue Flomax       Discharge Instructions  Discharge Instructions    Diet - low sodium heart healthy   Complete by: As directed    Discharge instructions    Complete by: As directed    From Dr. Loleta Books and Dr. Nehemiah Massed: You were admitted for weakness and feeling out of breath.  Here, we found that this was thankfully not from your lung disease (for which you are following with Dr. Lanney Gins) but from a congestive heart failure flare. (There was initially some concern that you had a heart attack, but Dr. Nehemiah Massed ruled this out, you did not have a heart attack)  You were treated with high dose IV Lasix and the heart failure flare was corrected  You should take a baby aspirin 81 mg (buy over the counter) and take daily from now on You should also take Lasix 40 mg daily  Go see Dr. Ubaldo Glassing in 1 week Have Dr. Ubaldo Glassing check your labs and adjust your Lasix dose as needed  Avoid salt in your diet STRICTLY Weigh yourself every day, and if you ever notice more than 3 lbs weight gain in a day or more than 5 lbs weight gain OVERALL, call Dr. Bethanne Ginger office immediately   Increase activity slowly   Complete by: As directed      Allergies as of 06/09/2019      Reactions   5ht3 Receptor Antagonists    Diphenhydramine Hcl Other (See Comments)   Reaction:  Rash and fever a long time ago, but has had it since with no problem.   Escitalopram Other (See Comments)   Reaction:  Makes him feel faint, like he was going to have a heart attack.   Maxidex [dexamethasone] Other (See  Comments)   Reaction:  Unknown    Prednisone Other (See Comments)   Pt states that this medication made him feel crazy.     Serotonin Other (See Comments)   Tried 2 different types, lexapro and another one.  Reaction:  Made him feel like he was having a heart attack.    Vytorin [ezetimibe-simvastatin] Other (See Comments)   Reaction:  Unknown    Zocor [simvastatin] Other (See Comments)   Reaction:  Unknown    Diltiazem Rash      Medication List    TAKE these medications   acetaminophen 500 MG tablet Commonly known as: TYLENOL Take 500-1,000 mg by mouth every 6 (six) hours as  needed for mild pain, moderate pain or fever.   ascorbic acid 250 MG tablet Commonly known as: VITAMIN C Take 1 tablet (250 mg total) by mouth daily.   aspirin 81 MG EC tablet Take 1 tablet (81 mg total) by mouth daily. Start taking on: June 10, 2019   atorvastatin 40 MG tablet Commonly known as: LIPITOR Take 1 tablet (40 mg total) by mouth daily at 6 PM.   digoxin 0.125 MG tablet Commonly known as: LANOXIN Take 0.0625 mg by mouth daily.   docusate sodium 100 MG capsule Commonly known as: COLACE Take 100 mg by mouth daily as needed for mild constipation.   famotidine 20 MG tablet Commonly known as: PEPCID Take 20 mg by mouth at bedtime.   ferrous sulfate 325 (65 FE) MG tablet Take 1 tablet (325 mg total) by mouth every other day. What changed: when to take this   furosemide 40 MG tablet Commonly known as: LASIX Take 1 tablet (40 mg total) by mouth daily. Start taking on: June 10, 2019 What changed:   medication strength  when to take this  reasons to take this   metoprolol tartrate 25 MG tablet Commonly known as: LOPRESSOR Take 0.5 tablets (12.5 mg total) by mouth 2 (two) times daily.   nitroGLYCERIN 0.4 MG SL tablet Commonly known as: NITROSTAT Place 0.4 mg under the tongue every 5 (five) minutes as needed for chest pain.   polyethylene glycol 17 g packet Commonly known as: MIRALAX / GLYCOLAX Take 17 g by mouth daily as needed for mild constipation.   ranolazine 1000 MG SR tablet Commonly known as: RANEXA Take 500 mg by mouth 2 (two) times daily.   sucralfate 1 g tablet Commonly known as: CARAFATE Take 1 g by mouth 2 (two) times daily.   tamsulosin 0.4 MG Caps capsule Commonly known as: FLOMAX Take 0.4 mg by mouth daily after supper.   Trelegy Ellipta 100-62.5-25 MCG/INH Aepb Generic drug: Fluticasone-Umeclidin-Vilant Inhale 1 puff into the lungs daily.   vitamin B-12 500 MCG tablet Commonly known as: CYANOCOBALAMIN Take 500 mcg by mouth  daily.      Follow-up Information    Fath, Javier Docker, MD. Schedule an appointment as soon as possible for a visit in 1 week(s).   Specialty: Cardiology Contact information: [address] [place] [address] [phone]          Allergies  Allergen Reactions  . 5ht3 Receptor Antagonists   . Diphenhydramine Hcl Other (See Comments)    Reaction:  Rash and fever a long time ago, but has had it since with no problem.  . Escitalopram Other (See Comments)    Reaction:  Makes him feel faint, like he was going to have a heart attack.  . Maxidex [Dexamethasone] Other (See Comments)  Reaction:  Unknown   . Prednisone Other (See Comments)    Pt states that this medication made him feel crazy.    . Serotonin Other (See Comments)    Tried 2 different types, lexapro and another one.  Reaction:  Made him feel like he was having a heart attack.   . Vytorin [Ezetimibe-Simvastatin] Other (See Comments)    Reaction:  Unknown   . Zocor [Simvastatin] Other (See Comments)    Reaction:  Unknown   . Diltiazem Rash    Consultations:  Cardiology   Procedures/Studies: DG Chest 2 View  Result Date: 05/18/2019 CLINICAL DATA:  Dyspnea EXAM: CHEST - 2 VIEW COMPARISON:  02/25/2017 chest radiograph. FINDINGS: Stable discontinuities in the second and third superior most sternotomy wires. CABG clips overlie the mediastinum. Stable cardiomediastinal silhouette with normal heart size. No pneumothorax. No pleural effusion. No pulmonary edema. Scattered chronic mild reticular opacities at both lung bases are mildly increased. No acute consolidative airspace disease. Chronic mild bullous emphysema at the left lung apex. Hyperinflated lungs. IMPRESSION: 1. No acute cardiopulmonary disease. 2. Chronic mild bullous emphysema at the left lung apex. Hyperinflated lungs suggest COPD. 3. Chronic mild scattered reticular opacities at the lung bases, mildly increased, favor mild scarring or atelectasis.  Electronically Signed   By: Ilona Sorrel M.D.   On: 05/18/2019 11:52   DG Chest Portable 1 View  Result Date: 06/07/2019 CLINICAL DATA:  Shortness of breath for 3 weeks, weakness for 1 week, history of CHF, cardiomyopathy, coronary artery disease post MI, rare lung disease with "holes in his lungs", atrial fibrillation EXAM: PORTABLE CHEST 1 VIEW COMPARISON:  Portable exam 1417 hours compared to 05/18/2019 FINDINGS: Enlargement of cardiac silhouette post median sternotomy. Pulmonary vascular congestion. Mild scattered interstitial infiltrates favoring pulmonary edema. No pleural effusion or pneumothorax. Osseous structures unremarkable. IMPRESSION: Enlargement of cardiac silhouette with pulmonary vascular congestion and probable mild pulmonary edema. Electronically Signed   By: Lavonia Dana M.D.   On: 06/07/2019 14:49   DG Foot Complete Left  Result Date: 06/08/2019 CLINICAL DATA:  "Bilateral foot infections". EXAM: LEFT FOOT - COMPLETE 3+ VIEW COMPARISON:  None. FINDINGS: First metatarsophalangeal advanced osteoarthritis, secondary to hallux valgus deformity. Remote fractures of the second through fourth metatarsals. No acute fracture or dislocation. Small calcaneal spur. No well-defined soft tissue ulcer. No soft tissue gas. No osseous destruction. IMPRESSION: No acute osseous abnormality. Electronically Signed   By: Abigail Miyamoto M.D.   On: 06/08/2019 14:34   DG Foot Complete Right  Result Date: 06/08/2019 CLINICAL DATA:  Foot infection EXAM: RIGHT FOOT COMPLETE - 3+ VIEW COMPARISON:  A 09/2015 FINDINGS: Chronic fracture second metatarsal with exuberant chronic periosteal bone formation. Single screw in the fracture. Mild displacement of fracture, chronic and unchanged No acute fracture.  Mild hallux valgus.  No erosion. Mild arterial calcification.  Negative for soft tissue swelling. IMPRESSION: Chronic fracture deformity second metatarsal unchanged. Hallux valgus with degenerative change. No acute  abnormality. Electronically Signed   By: Franchot Gallo M.D.   On: 06/08/2019 14:35   ECHOCARDIOGRAM COMPLETE  Result Date: 06/08/2019   ECHOCARDIOGRAM REPORT   Patient Name:   NICKOLAI RINKS Date of Exam: 06/08/2019 Medical Rec #:  [id number]    Height:       76.0 in Accession #:    [id number]   Weight:       204.5 lb Date of Birth:  1930-10-30    BSA:          2.24  m Patient Age:    86 years     BP:           124/81 mmHg Patient Gender: M            HR:           68 bpm. Exam Location:  [facility] Procedure: 2D Echo, Color Doppler and Cardiac Doppler Indications:     R06.00 Dyspnea  History:         Patient has prior history of Echocardiogram examinations, most                  recent 01/08/2019. CHF and Cardiomyopathy, CAD, Prior CABG, CKD;                  Risk Factors:Hypertension.  Sonographer:     Charmayne Sheer RDCS (AE) Referring Phys:  [id number] [name] AMIN Diagnosing Phys: Serafina Royals MD IMPRESSIONS  1. Left ventricular ejection fraction, by visual estimation, is 20 to 25%. The left ventricle has severely decreased function. There is no left ventricular hypertrophy.  2. Moderately dilated left ventricular internal cavity size.  3. The left ventricle demonstrates global hypokinesis.  4. Global right ventricle has normal systolic function.The right ventricular size is normal. No increase in right ventricular wall thickness.  5. Left atrial size was mildly dilated.  6. Right atrial size was mildly dilated.  7. The mitral valve is normal in structure. Moderate mitral valve regurgitation.  8. The tricuspid valve is normal in structure.  9. The tricuspid valve is normal in structure. Tricuspid valve regurgitation is moderate. 10. The aortic valve is normal in structure. Aortic valve regurgitation is trivial. 11. The pulmonic valve was normal in structure. Pulmonic valve regurgitation is trivial. FINDINGS  Left Ventricle: Left ventricular ejection fraction, by visual estimation, is 20 to 25%. The left ventricle has  severely decreased function. The left ventricle demonstrates global hypokinesis. The left ventricular internal cavity size was moderately dilated left ventricle. There is no left ventricular hypertrophy. Right Ventricle: The right ventricular size is normal. No increase in right ventricular wall thickness. Global RV systolic function is has normal systolic function. Left Atrium: Left atrial size was mildly dilated. Right Atrium: Right atrial size was mildly dilated Pericardium: There is no evidence of pericardial effusion. Mitral Valve: The mitral valve is normal in structure. Moderate mitral valve regurgitation. MV peak gradient, 6.6 mmHg. Tricuspid Valve: The tricuspid valve is normal in structure. Tricuspid valve regurgitation is moderate. Aortic Valve: The aortic valve is normal in structure. Aortic valve regurgitation is trivial. Aortic valve mean gradient measures 7.0 mmHg. Aortic valve peak gradient measures 12.8 mmHg. Aortic valve area, by VTI measures 1.98 cm. Pulmonic Valve: The pulmonic valve was normal in structure. Pulmonic valve regurgitation is trivial. Pulmonic regurgitation is trivial. Aorta: The aortic root, ascending aorta and aortic arch are all structurally normal, with no evidence of dilitation or obstruction. IAS/Shunts: No atrial level shunt detected by color flow Doppler.  LEFT VENTRICLE PLAX 2D LVIDd:         5.81 cm       Diastology LVIDs:         5.22 cm       LV e' medial:   5.77 cm/s LV PW:         1.25 cm       LV E/e' medial: 22.2 LV IVS:        0.88 cm LVOT diam:     2.20 cm LV SV:  37 ml LV SV Index:   16.37 LVOT Area:     3.80 cm  LV Volumes (MOD) LV area d, A4C:    48.50 cm LV area s, A4C:    36.90 cm LV major d, A4C:   9.85 cm LV major s, A4C:   9.63 cm LV vol d, MOD A4C: 196.0 ml LV vol s, MOD A4C: 120.0 ml LV SV MOD A4C:     196.0 ml RIGHT VENTRICLE RV Basal diam:  3.66 cm LEFT ATRIUM              Index       RIGHT ATRIUM           Index LA diam:        5.60 cm  2.50  cm/m  RA Area:     22.90 cm LA Vol (A2C):   74.3 ml  33.22 ml/m RA Volume:   68.80 ml  30.77 ml/m LA Vol (A4C):   116.0 ml 51.87 ml/m LA Biplane Vol: 94.2 ml  42.12 ml/m  AORTIC VALVE                    PULMONIC VALVE AV Area (Vmax):    2.01 cm     PV Vmax:       1.06 m/s AV Area (Vmean):   2.14 cm     PV Vmean:      79.500 cm/s AV Area (VTI):     1.98 cm     PV VTI:        0.215 m AV Vmax:           179.00 cm/s  PV Peak grad:  4.5 mmHg AV Vmean:          120.000 cm/s PV Mean grad:  3.0 mmHg AV VTI:            0.355 m AV Peak Grad:      12.8 mmHg AV Mean Grad:      7.0 mmHg LVOT Vmax:         94.60 cm/s LVOT Vmean:        67.600 cm/s LVOT VTI:          0.185 m LVOT/AV VTI ratio: 0.52  AORTA Ao Root diam: 3.80 cm MITRAL VALVE MV Area (PHT): 3.91 cm              SHUNTS MV Peak grad:  6.6 mmHg              Systemic VTI:  0.18 m MV Mean grad:  2.0 mmHg              Systemic Diam: 2.20 cm MV Vmax:       1.28 m/s MV Vmean:      68.6 cm/s MV VTI:        0.33 m MV PHT:        56.26 msec MV Decel Time: 194 msec MV E velocity: 128.00 cm/s 103 cm/s MV A velocity: 59.20 cm/s  70.3 cm/s MV E/A ratio:  2.16        1.5  Serafina Royals MD Electronically signed by Serafina Royals MD Signature Date/Time: 06/08/2019/1:15:09 PM    Final       Subjective: Feeling well.  Ambulated without dyspnea more than baseline.  No swelling.  No fever, no sputum, no cough or chest pain  Discharge Exam: Vitals:   06/09/19 0812 06/09/19 1000  BP: 123/67 131/81  Pulse: 78 94  Resp: 18   Temp: 97.7 F (36.5 C) 97.9 F (36.6 C)  SpO2: 96% 97%   Vitals:   06/08/19 1948 06/09/19 0322 06/09/19 0812 06/09/19 1000  BP: 127/75 (!) 109/57 123/67 131/81  Pulse: 73 69 78 94  Resp: 18 19 18    Temp: 98.7 F (37.1 C) 97.6 F (36.4 C) 97.7 F (36.5 C) 97.9 F (36.6 C)  TempSrc: Oral Oral Oral Oral  SpO2: 98% 98% 96% 97%  Weight:  91.8 kg    Height:        General: Pt is alert, awake, not in acute distress, sitting in  recliner talking on phone Cardiovascular: RRR, nl S1-S2, no murmurs appreciated.   No LE edema.   Respiratory: Normal respiratory rate and rhythm.  CTAB without rales or wheezes. Abdominal: Abdomen soft and non-tender.  No distension or HSM.   Neuro/Psych: Strength symmetric in upper and lower extremities.  Judgment and insight appear normal.   The results of significant diagnostics from this hospitalization (including imaging, microbiology, ancillary and laboratory) are listed below for reference.     Microbiology: Recent Results (from the past 240 hour(s))  Respiratory Panel by RT PCR (Flu A&B, Covid) - Nasopharyngeal Swab     Status: None   Collection Time: 06/07/19  2:53 PM   Specimen: Nasopharyngeal Swab  Result Value Ref Range Status   SARS Coronavirus 2 by RT PCR NEGATIVE NEGATIVE Final    Comment: (NOTE) SARS-CoV-2 target nucleic acids are NOT DETECTED. The SARS-CoV-2 RNA is generally detectable in upper respiratoy specimens during the acute phase of infection. The lowest concentration of SARS-CoV-2 viral copies this assay can detect is 131 copies/mL. A negative result does not preclude SARS-Cov-2 infection and should not be used as the sole basis for treatment or other patient management decisions. A negative result may occur with  improper specimen collection/handling, submission of specimen other than nasopharyngeal swab, presence of viral mutation(s) within the areas targeted by this assay, and inadequate number of viral copies (<131 copies/mL). A negative result must be combined with clinical observations, patient history, and epidemiological information. The expected result is Negative. Fact Sheet for Patients:  PinkCheek.be Fact Sheet for Healthcare Providers:  GravelBags.it This test is not yet ap proved or cleared by the [place] FDA and  has been authorized for detection and/or diagnosis of SARS-CoV-2  by FDA under an Emergency Use Authorization (EUA). This EUA will remain  in effect (meaning this test can be used) for the duration of the COVID-19 declaration under Section 564(b)(1) of the Act, 21 U.S.C. section 360bbb-3(b)(1), unless the authorization is terminated or revoked sooner.    Influenza A by PCR NEGATIVE NEGATIVE Final   Influenza B by PCR NEGATIVE NEGATIVE Final    Comment: (NOTE) The Xpert Xpress SARS-CoV-2/FLU/RSV assay is intended as an aid in  the diagnosis of influenza from Nasopharyngeal swab specimens and  should not be used as a sole basis for treatment. Nasal washings and  aspirates are unacceptable for Xpert Xpress SARS-CoV-2/FLU/RSV  testing. Fact Sheet for Patients: PinkCheek.be Fact Sheet for Healthcare Providers: GravelBags.it This test is not yet approved or cleared by the [place] FDA and  has been authorized for detection and/or diagnosis of SARS-CoV-2 by  FDA under an Emergency Use Authorization (EUA). This EUA will remain  in effect (meaning this test can be used) for the duration of the  Covid-19 declaration under Section 564(b)(1) of the Act, 21  U.S.C. section 360bbb-3(b)(1), unless the authorization is  terminated or revoked. Performed at [facility], [address] [place], [place] [address]      Labs: BNP (last 3 results) Recent Labs    06/07/19 1453  BNP 242.3*   Basic Metabolic Panel: Recent Labs  Lab 06/07/19 1453 06/08/19 0100 06/09/19 0549  NA 138 137 138  K 4.1 3.8 4.0  CL 109 109 107  CO2 23 21* 23  GLUCOSE 111* 103* 95  BUN 32* 32* 40*  CREATININE 1.62* 1.61* 1.62*  CALCIUM 9.0 8.4* 8.6*   Liver Function Tests: Recent Labs  Lab 06/07/19 1453  AST 29  ALT 24  ALKPHOS 60  BILITOT 1.0  PROT 7.5  ALBUMIN 4.0   No results for input(s): LIPASE, AMYLASE in the last 168 hours. No results for input(s): AMMONIA in the last 168  hours. CBC: Recent Labs  Lab 06/07/19 1453 06/08/19 0700 06/09/19 0549  WBC 11.5* 10.4 13.8*  NEUTROABS 7.1  --   --   HGB 10.0* 8.8* 9.4*  HCT 30.9* 27.1* 28.8*  MCV 104.0* 104.2* 102.9*  PLT 150 142* 144*   Cardiac Enzymes: No results for input(s): CKTOTAL, CKMB, CKMBINDEX, TROPONINI in the last 168 hours. BNP: Invalid input(s): POCBNP CBG: No results for input(s): GLUCAP in the last 168 hours. D-Dimer No results for input(s): DDIMER in the last 72 hours. Hgb A1c No results for input(s): HGBA1C in the last 72 hours. Lipid Profile No results for input(s): CHOL, HDL, LDLCALC, TRIG, CHOLHDL, LDLDIRECT in the last 72 hours. Thyroid function studies No results for input(s): TSH, T4TOTAL, T3FREE, THYROIDAB in the last 72 hours.  Invalid input(s): FREET3 Anemia work up No results for input(s): VITAMINB12, FOLATE, FERRITIN, TIBC, IRON, RETICCTPCT in the last 72 hours. Urinalysis    Component Value Date/Time   COLORURINE YELLOW (A) 03/26/2019 1501   APPEARANCEUR HAZY (A) 03/26/2019 1501   LABSPEC 1.013 03/26/2019 1501   PHURINE 5.0 03/26/2019 1501   GLUCOSEU NEGATIVE 03/26/2019 1501   HGBUR NEGATIVE 03/26/2019 1501   BILIRUBINUR NEGATIVE 03/26/2019 1501   KETONESUR NEGATIVE 03/26/2019 1501   PROTEINUR NEGATIVE 03/26/2019 1501   UROBILINOGEN 0.2 09/11/2010 0647   NITRITE NEGATIVE 03/26/2019 1501   LEUKOCYTESUR NEGATIVE 03/26/2019 1501   Sepsis Labs Invalid input(s): PROCALCITONIN,  WBC,  LACTICIDVEN Microbiology Recent Results (from the past 240 hour(s))  Respiratory Panel by RT PCR (Flu A&B, Covid) - Nasopharyngeal Swab     Status: None   Collection Time: 06/07/19  2:53 PM   Specimen: Nasopharyngeal Swab  Result Value Ref Range Status   SARS Coronavirus 2 by RT PCR NEGATIVE NEGATIVE Final    Comment: (NOTE) SARS-CoV-2 target nucleic acids are NOT DETECTED. The SARS-CoV-2 RNA is generally detectable in upper respiratoy specimens during the acute phase of infection.  The lowest concentration of SARS-CoV-2 viral copies this assay can detect is 131 copies/mL. A negative result does not preclude SARS-Cov-2 infection and should not be used as the sole basis for treatment or other patient management decisions. A negative result may occur with  improper specimen collection/handling, submission of specimen other than nasopharyngeal swab, presence of viral mutation(s) within the areas targeted by this assay, and inadequate number of viral copies (<131 copies/mL). A negative result must be combined with clinical observations, patient history, and epidemiological information. The expected result is Negative. Fact Sheet for Patients:  PinkCheek.be Fact Sheet for Healthcare Providers:  GravelBags.it This test is not yet ap proved or cleared by the [place] and  has been authorized  for detection and/or diagnosis of SARS-CoV-2 by FDA under an Emergency Use Authorization (EUA). This EUA will remain  in effect (meaning this test can be used) for the duration of the COVID-19 declaration under Section 564(b)(1) of the Act, 21 U.S.C. section 360bbb-3(b)(1), unless the authorization is terminated or revoked sooner.    Influenza A by PCR NEGATIVE NEGATIVE Final   Influenza B by PCR NEGATIVE NEGATIVE Final    Comment: (NOTE) The Xpert Xpress SARS-CoV-2/FLU/RSV assay is intended as an aid in  the diagnosis of influenza from Nasopharyngeal swab specimens and  should not be used as a sole basis for treatment. Nasal washings and  aspirates are unacceptable for Xpert Xpress SARS-CoV-2/FLU/RSV  testing. Fact Sheet for Patients: PinkCheek.be Fact Sheet for Healthcare Providers: GravelBags.it This test is not yet approved or cleared by the [place] FDA and  has been authorized for detection and/or diagnosis of SARS-CoV-2 by  FDA under an  Emergency Use Authorization (EUA). This EUA will remain  in effect (meaning this test can be used) for the duration of the  Covid-19 declaration under Section 564(b)(1) of the Act, 21  U.S.C. section 360bbb-3(b)(1), unless the authorization is  terminated or revoked. Performed at [facility], [address] [place], [place] [address]      Time coordinating discharge: 35 minutes The [place] controlled substances registry was reviewed for this patient        SIGNED:   Edwin Dada, MD  [facility] 06/09/2019, 10:50 AM

## 2019-06-09 NOTE — Progress Notes (Signed)
[place] for Heparin Indication: chest pain/ACS  Allergies  Allergen Reactions  . 5ht3 Receptor Antagonists   . Diphenhydramine Hcl Other (See Comments)    Reaction:  Rash and fever a long time ago, but has had it since with no problem.  . Escitalopram Other (See Comments)    Reaction:  Makes him feel faint, like he was going to have a heart attack.  . Maxidex [Dexamethasone] Other (See Comments)    Reaction:  Unknown   . Prednisone Other (See Comments)    Pt states that this medication made him feel crazy.    . Serotonin Other (See Comments)    Tried 2 different types, lexapro and another one.  Reaction:  Made him feel like he was having a heart attack.   . Vytorin [Ezetimibe-Simvastatin] Other (See Comments)    Reaction:  Unknown   . Zocor [Simvastatin] Other (See Comments)    Reaction:  Unknown   . Diltiazem Rash    Patient Measurements: Height: 6\' 4"  (193 cm) Weight: 202 lb 6.4 oz (91.8 kg) IBW/kg (Calculated) : 86.8 Heparin Dosing Weight: 93 kg  Vital Signs: Temp: 97.6 F (36.4 C) (01/26 0322) Temp Source: Oral (01/26 0322) BP: 109/57 (01/26 0322) Pulse Rate: 69 (01/26 0322)  Labs: Recent Labs    06/07/19 1453 06/07/19 1453 06/07/19 1619 06/07/19 1837 06/08/19 0100 06/08/19 0100 06/08/19 0700 06/08/19 0943 06/08/19 1847 06/09/19 0549  HGB 10.0*   < >  --   --   --   --  8.8*  --   --  9.4*  HCT 30.9*  --   --   --   --   --  27.1*  --   --  28.8*  PLT 150  --   --   --   --   --  142*  --   --  144*  APTT  --   --  34  --   --   --   --   --   --   --   LABPROT  --   --  13.6  --   --   --   --   --   --   --   INR  --   --  1.1  --   --   --   --   --   --   --   HEPARINUNFRC  --   --   --   --  0.26*   < >  --  0.48 0.52 0.63  CREATININE 1.62*  --   --   --  1.61*  --   --   --   --  1.62*  TROPONINIHS 920*  --   --  870*  --   --   --   --   --   --    < > = values in this interval not displayed.     Estimated Creatinine Clearance: 40.2 mL/min (A) (by C-G formula based on SCr of 1.62 mg/dL (H)).   Medical History: Past Medical History:  Diagnosis Date  . Anemia   . Anxiety   . Atrial fibrillation ([place])   . Cardiomyopathy ([place])   . CHF (congestive heart failure) ([place])   . Chronic kidney disease    kidney stones  . Coronary artery disease   . Cough   . Dysrhythmia   . Hypertension   . Lymphadenopathy, hilar   . MI (myocardial  infarction) ([place])    x 2  . Wheezing     Assessment: 86 yo male with anemia, anxiety, A. Fib, CHF, prior MIs comes in for weakness and SOB.  Troponin markedly elevated to 920  1/25 0943 HL 0.48 therapeutic x 1 1/25 1847 HL 0.52 therapeutic x 2    Goal of Therapy:  Heparin level 0.3-0.7 units/ml Monitor platelets by anticoagulation protocol: Yes   Plan:  0126 @ 0500 HL 0.63 therapeutic. Will continue current rate and will recheck HL w/ am labs, CBC trending down will continue to monitor.  Tobie Lords, PharmD, BCPS Clinical Pharmacist 06/09/2019 7:27 AM

## 2019-06-10 ENCOUNTER — Inpatient Hospital Stay: Payer: PPO

## 2019-06-15 ENCOUNTER — Other Ambulatory Visit: Payer: Self-pay

## 2019-06-15 DIAGNOSIS — L97511 Non-pressure chronic ulcer of other part of right foot limited to breakdown of skin: Secondary | ICD-10-CM | POA: Diagnosis not present

## 2019-06-15 DIAGNOSIS — L97522 Non-pressure chronic ulcer of other part of left foot with fat layer exposed: Secondary | ICD-10-CM | POA: Diagnosis not present

## 2019-06-15 DIAGNOSIS — E119 Type 2 diabetes mellitus without complications: Secondary | ICD-10-CM | POA: Diagnosis not present

## 2019-06-16 ENCOUNTER — Other Ambulatory Visit: Payer: Self-pay

## 2019-06-16 ENCOUNTER — Inpatient Hospital Stay: Payer: PPO | Attending: Internal Medicine

## 2019-06-16 ENCOUNTER — Inpatient Hospital Stay: Payer: PPO

## 2019-06-16 VITALS — BP 129/67 | HR 96

## 2019-06-16 DIAGNOSIS — Z7982 Long term (current) use of aspirin: Secondary | ICD-10-CM | POA: Insufficient documentation

## 2019-06-16 DIAGNOSIS — I252 Old myocardial infarction: Secondary | ICD-10-CM | POA: Diagnosis not present

## 2019-06-16 DIAGNOSIS — Z79899 Other long term (current) drug therapy: Secondary | ICD-10-CM | POA: Insufficient documentation

## 2019-06-16 DIAGNOSIS — I129 Hypertensive chronic kidney disease with stage 1 through stage 4 chronic kidney disease, or unspecified chronic kidney disease: Secondary | ICD-10-CM | POA: Diagnosis not present

## 2019-06-16 DIAGNOSIS — Z87891 Personal history of nicotine dependence: Secondary | ICD-10-CM | POA: Diagnosis not present

## 2019-06-16 DIAGNOSIS — I429 Cardiomyopathy, unspecified: Secondary | ICD-10-CM | POA: Diagnosis not present

## 2019-06-16 DIAGNOSIS — D631 Anemia in chronic kidney disease: Secondary | ICD-10-CM | POA: Insufficient documentation

## 2019-06-16 DIAGNOSIS — D696 Thrombocytopenia, unspecified: Secondary | ICD-10-CM | POA: Diagnosis not present

## 2019-06-16 DIAGNOSIS — R531 Weakness: Secondary | ICD-10-CM | POA: Diagnosis not present

## 2019-06-16 DIAGNOSIS — D469 Myelodysplastic syndrome, unspecified: Secondary | ICD-10-CM | POA: Insufficient documentation

## 2019-06-16 DIAGNOSIS — N183 Chronic kidney disease, stage 3 unspecified: Secondary | ICD-10-CM

## 2019-06-16 DIAGNOSIS — I509 Heart failure, unspecified: Secondary | ICD-10-CM | POA: Diagnosis not present

## 2019-06-16 DIAGNOSIS — N184 Chronic kidney disease, stage 4 (severe): Secondary | ICD-10-CM | POA: Insufficient documentation

## 2019-06-16 DIAGNOSIS — D500 Iron deficiency anemia secondary to blood loss (chronic): Secondary | ICD-10-CM

## 2019-06-16 DIAGNOSIS — I2510 Atherosclerotic heart disease of native coronary artery without angina pectoris: Secondary | ICD-10-CM | POA: Diagnosis not present

## 2019-06-16 DIAGNOSIS — R5383 Other fatigue: Secondary | ICD-10-CM | POA: Diagnosis not present

## 2019-06-16 LAB — COMPREHENSIVE METABOLIC PANEL
ALT: 25 U/L (ref 0–44)
AST: 20 U/L (ref 15–41)
Albumin: 4.7 g/dL (ref 3.5–5.0)
Alkaline Phosphatase: 64 U/L (ref 38–126)
Anion gap: 11 (ref 5–15)
BUN: 56 mg/dL — ABNORMAL HIGH (ref 8–23)
CO2: 21 mmol/L — ABNORMAL LOW (ref 22–32)
Calcium: 9.4 mg/dL (ref 8.9–10.3)
Chloride: 104 mmol/L (ref 98–111)
Creatinine, Ser: 2.18 mg/dL — ABNORMAL HIGH (ref 0.61–1.24)
GFR calc Af Amer: 31 mL/min — ABNORMAL LOW (ref >60)
GFR calc non Af Amer: 26 mL/min — ABNORMAL LOW (ref >60)
Glucose, Bld: 141 mg/dL — ABNORMAL HIGH (ref 70–99)
Potassium: 4.1 mmol/L (ref 3.5–5.1)
Sodium: 136 mmol/L (ref 135–145)
Total Bilirubin: 1.1 mg/dL (ref 0.3–1.2)
Total Protein: 7.9 g/dL (ref 6.5–8.1)

## 2019-06-16 LAB — CBC
HCT: 30.3 % — ABNORMAL LOW (ref 39.0–52.0)
Hemoglobin: 9.5 g/dL — ABNORMAL LOW (ref 13.0–17.0)
MCH: 33.1 pg (ref 26.0–34.0)
MCHC: 31.4 g/dL (ref 30.0–36.0)
MCV: 105.6 fL — ABNORMAL HIGH (ref 80.0–100.0)
Platelets: 185 10*3/uL (ref 150–400)
RBC: 2.87 MIL/uL — ABNORMAL LOW (ref 4.22–5.81)
RDW: 16.4 % — ABNORMAL HIGH (ref 11.5–15.5)
WBC: 12.4 10*3/uL — ABNORMAL HIGH (ref 4.0–10.5)
nRBC: 0 % (ref 0.0–0.2)

## 2019-06-16 LAB — LACTATE DEHYDROGENASE: LDH: 205 U/L — ABNORMAL HIGH (ref 98–192)

## 2019-06-16 MED ORDER — DARBEPOETIN ALFA 300 MCG/0.6ML IJ SOSY
300.0000 ug | PREFILLED_SYRINGE | Freq: Once | INTRAMUSCULAR | Status: AC
  Administered 2019-06-16: 300 ug via SUBCUTANEOUS
  Filled 2019-06-16: qty 0.6

## 2019-06-19 ENCOUNTER — Other Ambulatory Visit: Payer: Self-pay | Admitting: *Deleted

## 2019-06-19 ENCOUNTER — Other Ambulatory Visit: Payer: Self-pay

## 2019-06-19 DIAGNOSIS — D631 Anemia in chronic kidney disease: Secondary | ICD-10-CM

## 2019-06-22 ENCOUNTER — Other Ambulatory Visit: Payer: Self-pay

## 2019-06-22 ENCOUNTER — Inpatient Hospital Stay: Payer: PPO

## 2019-06-22 ENCOUNTER — Inpatient Hospital Stay (HOSPITAL_BASED_OUTPATIENT_CLINIC_OR_DEPARTMENT_OTHER): Payer: PPO | Admitting: Internal Medicine

## 2019-06-22 DIAGNOSIS — D631 Anemia in chronic kidney disease: Secondary | ICD-10-CM | POA: Diagnosis not present

## 2019-06-22 DIAGNOSIS — N183 Chronic kidney disease, stage 3 unspecified: Secondary | ICD-10-CM

## 2019-06-22 DIAGNOSIS — D500 Iron deficiency anemia secondary to blood loss (chronic): Secondary | ICD-10-CM

## 2019-06-22 DIAGNOSIS — I129 Hypertensive chronic kidney disease with stage 1 through stage 4 chronic kidney disease, or unspecified chronic kidney disease: Secondary | ICD-10-CM | POA: Diagnosis not present

## 2019-06-22 LAB — BASIC METABOLIC PANEL
Anion gap: 13 (ref 5–15)
BUN: 47 mg/dL — ABNORMAL HIGH (ref 8–23)
CO2: 22 mmol/L (ref 22–32)
Calcium: 9.2 mg/dL (ref 8.9–10.3)
Chloride: 100 mmol/L (ref 98–111)
Creatinine, Ser: 2.31 mg/dL — ABNORMAL HIGH (ref 0.61–1.24)
GFR calc Af Amer: 29 mL/min — ABNORMAL LOW (ref >60)
GFR calc non Af Amer: 25 mL/min — ABNORMAL LOW (ref >60)
Glucose, Bld: 156 mg/dL — ABNORMAL HIGH (ref 70–99)
Potassium: 4 mmol/L (ref 3.5–5.1)
Sodium: 135 mmol/L (ref 135–145)

## 2019-06-22 LAB — CBC WITH DIFFERENTIAL/PLATELET
Abs Immature Granulocytes: 0.7 10*3/uL — ABNORMAL HIGH (ref 0.00–0.07)
Band Neutrophils: 3 %
Basophils Absolute: 0 10*3/uL (ref 0.0–0.1)
Basophils Relative: 0 %
Eosinophils Absolute: 0 10*3/uL (ref 0.0–0.5)
Eosinophils Relative: 0 %
HCT: 30.7 % — ABNORMAL LOW (ref 39.0–52.0)
Hemoglobin: 9.7 g/dL — ABNORMAL LOW (ref 13.0–17.0)
Lymphocytes Relative: 22 %
Lymphs Abs: 3.3 10*3/uL (ref 0.7–4.0)
MCH: 33.7 pg (ref 26.0–34.0)
MCHC: 31.6 g/dL (ref 30.0–36.0)
MCV: 106.6 fL — ABNORMAL HIGH (ref 80.0–100.0)
Monocytes Absolute: 2.7 10*3/uL — ABNORMAL HIGH (ref 0.1–1.0)
Monocytes Relative: 18 %
Myelocytes: 5 %
Neutro Abs: 8.1 10*3/uL — ABNORMAL HIGH (ref 1.7–7.7)
Neutrophils Relative %: 52 %
Platelets: 173 10*3/uL (ref 150–400)
RBC: 2.88 MIL/uL — ABNORMAL LOW (ref 4.22–5.81)
RDW: 17 % — ABNORMAL HIGH (ref 11.5–15.5)
Smear Review: NORMAL
WBC: 14.8 10*3/uL — ABNORMAL HIGH (ref 4.0–10.5)
nRBC: 0.3 % — ABNORMAL HIGH (ref 0.0–0.2)

## 2019-06-22 LAB — LACTATE DEHYDROGENASE: LDH: 192 U/L (ref 98–192)

## 2019-06-22 MED ORDER — DARBEPOETIN ALFA 300 MCG/0.6ML IJ SOSY
300.0000 ug | PREFILLED_SYRINGE | Freq: Once | INTRAMUSCULAR | Status: AC
  Administered 2019-06-22: 300 ug via SUBCUTANEOUS
  Filled 2019-06-22: qty 0.6

## 2019-06-22 NOTE — Progress Notes (Signed)
[facility] NOTE  Patient Care Team: Rusty Aus, MD as PCP - General (Internal Medicine) Alisa Graff, FNP as Nurse Practitioner (Family Medicine) Ubaldo Glassing Javier Docker, MD as Consulting Physician (Cardiology) Erby Pian, MD as Referring Physician (Specialist)  CHIEF COMPLAINTS/PURPOSE OF CONSULTATION:   # 2012- Anemia 11-12- sec to CKD/IDA? MDS [no BMBx] [March 2017-sat-11%; ferritin-42] [Feb 2017- EGD/colo-July 2015 Dr.Skulskie];April 2017-PO Iron PO; May 2020-IV iron/Aranesp.  # Intermittent thrombocytopenia 120s-140s [Dec 2016-CT- neg cirrhosis/splenomegaly]  #June 2020 -LUL cystic lesion- 4x4cm ?  Question etiology [slightly increased from 2017]; question postinflammatory versus low-grade adenocarcinoma-repeat scan in 6 months [December 2020]  # CKD [creatinine 1.2- 1.4]; CAD/CHF  HISTORY OF PRESENTING ILLNESS:  Andres Gutierrez 86 y.o.  male history of above history of chronic anemia and intermittent thrombocytopenia is here for follow-up/ currently on IV iron and also Aranesp.  In the interim patient was admitted to hospital for worsening shortness of breath/acute CHF.  Patient was discharged on Lasix 40 mg a day.  Patient states that his weight is around 203; the hospital weight 208-211.   No bleeding.  No worsening bruising.  Patient had questions about taking aspirin.   Review of Systems  Constitutional: Positive for malaise/fatigue. Negative for chills, diaphoresis, fever and weight loss.  HENT: Negative for nosebleeds and sore throat.   Eyes: Negative for double vision.  Respiratory: Negative for cough, hemoptysis, sputum production, shortness of breath and wheezing.   Cardiovascular: Negative for chest pain, palpitations, orthopnea and leg swelling.  Gastrointestinal: Negative for abdominal pain, blood in stool, constipation, diarrhea, heartburn, melena, nausea and vomiting.  Genitourinary: Negative for dysuria, frequency and urgency.   Musculoskeletal: Positive for back pain and joint pain.  Skin: Negative.  Negative for itching and rash.  Neurological: Negative for dizziness, tingling, focal weakness, weakness and headaches.  Endo/Heme/Allergies: Does not bruise/bleed easily.  Psychiatric/Behavioral: Negative for depression. The patient is not nervous/anxious and does not have insomnia.      MEDICAL HISTORY:  Past Medical History:  Diagnosis Date  . Anemia   . Anxiety   . Cardiomyopathy ([place])   . CHF (congestive heart failure) ([place])   . Chronic kidney disease    kidney stones  . Coronary artery disease   . Cough   . Hypertension   . Lymphadenopathy, hilar   . MI (myocardial infarction) ([place])    x 2  . Wheezing     SURGICAL HISTORY: Past Surgical History:  Procedure Laterality Date  . BRONCHIAL NEEDLE ASPIRATION BIOPSY N/A 12/31/2014   Procedure: BRONCHIAL NEEDLE ASPIRATION BIOPSIES from carina;  Surgeon: Flora Lipps, MD;  Location: [facility] ORS;  Service: Cardiopulmonary;  Laterality: N/A;  . CARDIAC CATHETERIZATION N/A 12/28/2015   Procedure: Left Heart Cath and Cors/Grafts Angiography;  Surgeon: Yolonda Kida, MD;  Location: [facility];  Service: Cardiovascular;  Laterality: N/A;  . CATARACT EXTRACTION W/PHACO Left 08/01/2016   Procedure: CATARACT EXTRACTION PHACO AND INTRAOCULAR LENS PLACEMENT ([place]) Left;  Surgeon: Leandrew Koyanagi, MD;  Location: [facility];  Service: Ophthalmology;  Laterality: Left;  . CORONARY ANGIOPLASTY WITH STENT PLACEMENT    . CORONARY ARTERY BYPASS GRAFT    . ENDOBRONCHIAL ULTRASOUND N/A 12/31/2014   Procedure: ENDOBRONCHIAL ULTRASOUND;  Surgeon: Flora Lipps, MD;  Location: [facility] OR[facility];  Service: Cardiopulmonary;  Laterality: N/A;  . ESOPHAGOGASTRODUODENOSCOPY (EGD) WITH PROPOFOL N/A 06/20/2015   Procedure: ESOPHAGOGASTRODUODENOSCOPY (EGD) WITH PROPOFOL;  Surgeon: Lollie Sails, MD;  Location: [facility];  Service: Endoscopy;  Laterality: N/A;    SOCIAL  HISTORY: lives in [place]; alone. 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  Social History   Socioeconomic History  . Marital status: Widowed    Spouse name: Not on file  . Number of children: 2  . Years of education: college  . Highest education level: Some college, no degree  Occupational History  . Occupation: retired  Tobacco Use  . Smoking status: Former Smoker    Quit date: 06/27/1977    Years since quitting: 42.0  . Smokeless tobacco: Current User    Types: Chew  Substance and Sexual Activity  . Alcohol use: Yes    Comment: occational almost rare once a year  . Drug use: No  . Sexual activity: Never    Birth control/protection: Abstinence  Other Topics Concern  . Not on file  Social History Narrative  . Not on file   Social Determinants of Health   Financial Resource Strain:   . Difficulty of Paying Living Expenses: Not on file  Food Insecurity:   . Worried About Charity fundraiser in the Last Year: Not on file  . Ran Out of Food in the Last Year: Not on file  Transportation Needs:   . Lack of Transportation (Medical): Not on file  . Lack of Transportation (Non-Medical): Not on file  Physical Activity:   . Days of Exercise per Week: Not on file  . Minutes of Exercise per Session: Not on file  Stress:   . Feeling of Stress : Not on file  Social Connections:   . Frequency of Communication with Friends and Family: Not on file  . Frequency of Social Gatherings with Friends and Family: Not on file  . Attends Religious Services: Not on file  . Active Member of Clubs or Organizations: Not on file  . Attends Archivist Meetings: Not on file  . Marital Status: Not on file  Intimate Partner Violence:   . Fear of Current or Ex-Partner: Not on file  . Emotionally Abused: Not on file  . Physically Abused: Not on file  . Sexually Abused: Not on file    FAMILY HISTORY: Family History  Problem Relation Age of Onset  . CAD Mother   . Colon cancer Father     ALLERGIES:   is allergic to 5ht3 receptor antagonists; diphenhydramine hcl; escitalopram; maxidex [dexamethasone]; prednisone; serotonin; vytorin [ezetimibe-simvastatin]; zocor [simvastatin]; and diltiazem.  MEDICATIONS:  Current Outpatient Medications  Medication Sig Dispense Refill  . acetaminophen (TYLENOL) 500 MG tablet Take 500-1,000 mg by mouth every 6 (six) hours as needed for mild pain, moderate pain or fever.     Marland Kitchen aspirin EC 81 MG EC tablet Take 1 tablet (81 mg total) by mouth daily.    Marland Kitchen atorvastatin (LIPITOR) 40 MG tablet Take 1 tablet (40 mg total) by mouth daily at 6 PM. 30 tablet 0  . cephALEXin (KEFLEX) 500 MG capsule Take 1 capsule by mouth 3 (three) times daily.    . digoxin (LANOXIN) 0.125 MG tablet Take 0.0625 mg by mouth daily.    Marland Kitchen docusate sodium (COLACE) 100 MG capsule Take 100 mg by mouth daily as needed for mild constipation.     . famotidine (PEPCID) 20 MG tablet Take 20 mg by mouth at bedtime.    . ferrous sulfate 325 (65 FE) MG tablet Take 1 tablet (325 mg total) by mouth every other day. (Patient taking differently: Take 325 mg by mouth every Monday, Wednesday, and Friday. ) 30  tablet 3  . furosemide (LASIX) 40 MG tablet Take 1 tablet (40 mg total) by mouth daily. 30 tablet 3  . metoprolol tartrate (LOPRESSOR) 25 MG tablet Take 0.5 tablets (12.5 mg total) by mouth 2 (two) times daily. 30 tablet 0  . polyethylene glycol (MIRALAX / GLYCOLAX) 17 g packet Take 17 g by mouth daily as needed for mild constipation. 14 each 0  . ranolazine (RANEXA) 1000 MG SR tablet Take 500 mg by mouth 2 (two) times daily.     . sucralfate (CARAFATE) 1 G tablet Take 1 g by mouth 2 (two) times daily.     . tamsulosin (FLOMAX) 0.4 MG CAPS capsule Take 0.4 mg by mouth daily after supper.     . TRELEGY ELLIPTA 100-62.5-25 MCG/INH AEPB Inhale 1 puff into the lungs daily.    . vitamin B-12 (CYANOCOBALAMIN) 500 MCG tablet Take 500 mcg by mouth daily.    . vitamin C (VITAMIN C) 250 MG tablet Take 1 tablet (250  mg total) by mouth daily. 30 tablet 0  . nitroGLYCERIN (NITROSTAT) 0.4 MG SL tablet Place 0.4 mg under the tongue every 5 (five) minutes as needed for chest pain.     No current facility-administered medications for this visit.     PHYSICAL EXAMINATION: ECOG PERFORMANCE STATUS: 0 - Asymptomatic  Vitals:   06/22/19 1030  BP: 120/67  Pulse: 87  Temp: (!) 96.1 F (35.6 C)   Filed Weights   06/22/19 1030  Weight: 203 lb (92.1 kg)    Physical Exam  Constitutional: He is oriented to person, place, and time.  Elderly Caucasian male patient. He accompanied by his son.  Walking by with a walker.  HENT:  Head: Normocephalic and atraumatic.  Mouth/Throat: Oropharynx is clear and moist. No oropharyngeal exudate.  Eyes: Pupils are equal, round, and reactive to light.  Cardiovascular: Normal rate and regular rhythm.  Pulmonary/Chest: Breath sounds normal. No respiratory distress. He has no wheezes.  Abdominal: Soft. Bowel sounds are normal. He exhibits no distension and no mass. There is no abdominal tenderness. There is no rebound and no guarding.  Musculoskeletal:        General: No tenderness or edema. Normal range of motion.     Cervical back: Normal range of motion and neck supple.  Neurological: He is alert and oriented to person, place, and time.  Skin: Skin is warm.  Psychiatric: Affect normal.   .   LABORATORY DATA:  I have reviewed the data as listed Lab Results  Component Value Date   WBC 14.8 (H) 06/22/2019   HGB 9.7 (L) 06/22/2019   HCT 30.7 (L) 06/22/2019   MCV 106.6 (H) 06/22/2019   PLT 173 06/22/2019   Recent Labs    01/07/19 0711 01/08/19 0428 05/27/19 1402 05/27/19 1402 06/07/19 1453 06/08/19 0100 06/09/19 0549 06/16/19 1419 06/22/19 1003  NA 140   < > 137   < > 138   < > 138 136 135  K 4.3   < > 4.4   < > 4.1   < > 4.0 4.1 4.0  CL 109   < > 107   < > 109   < > 107 104 100  CO2 21*   < > 20*   < > 23   < > 23 21* 22  GLUCOSE 99   < > 112*   < >  111*   < > 95 141* 156*  BUN 24*   < > 42*   < >  32*   < > 40* 56* 47*  CREATININE 1.51*   < > 1.65*   < > 1.62*   < > 1.62* 2.18* 2.31*  CALCIUM 8.8*   < > 9.3   < > 9.0   < > 8.6* 9.4 9.2  GFRNONAA 42*   < > 37*   < > 38*   < > 38* 26* 25*  GFRAA 48*   < > 43*   < > 44*   < > 44* 31* 29*  PROT 7.8   < > 7.7  --  7.5  --   --  7.9  --   ALBUMIN 3.8   < > 4.7  --  4.0  --   --  4.7  --   AST 21   < > 18  --  29  --   --  20  --   ALT 15   < > 20  --  24  --   --  25  --   ALKPHOS 59   < > 51  --  60  --   --  64  --   BILITOT 0.9   < > 1.0  --  1.0  --   --  1.1  --   BILIDIR 0.2  --   --   --   --   --   --   --   --   IBILI 0.7  --   --   --   --   --   --   --   --    < > = values in this interval not displayed.   ASSESSMENT & PLAN:   Anemia of chronic kidney failure, stage 3 (moderate) ([place]) # Anemia-mild.  Since 2012.  Suspect MDS/chronic kidney disease.  No bone marrow biopsy.  Stable  # Hemoglobin 9.7;proceed with today.  Proceed with Aranesp today.  Continue p.o. iron.  # Mild intermittent thrombocytopenia-question ITP [nadir November 2020-80s].  Today platelets 173- ok to with asprin.  Stable.  #Recent acute CHF-2025% ejection fraction [[facility]]-see below ;recommend Lasix 20 mg a day [half pill once a day]; appointment with cardiology/PCP this week  #CKD stage IV- worse sec to directics- follow up with PCP/cards this week.   #Left upper lobe cystic lesion-incidentally found on imaging with [facility].  #Disposition: #  Proceed with Aranesp # in 2 weeks- cbc possible aranesp # in 4 weeks MD- cbc/bmp/LDH-possible aranesp-Dr.B      Cammie Sickle, MD 06/22/2019 12:46 PM

## 2019-06-22 NOTE — Assessment & Plan Note (Addendum)
#  Anemia-mild.  Since 2012.  Suspect MDS/chronic kidney disease.  No bone marrow biopsy.  Stable  # Hemoglobin 9.7;proceed with today.  Proceed with Aranesp today.  Continue p.o. iron.  # Mild intermittent thrombocytopenia-question ITP [nadir November 2020-80s].  Today platelets 173- ok to with asprin.  Stable.  #Recent acute CHF-2025% ejection fraction [[facility]]-see below ;recommend Lasix 20 mg a day [half pill once a day]; appointment with cardiology/PCP this week  #CKD stage IV- worse sec to directics- follow up with PCP/cards this week.   #Left upper lobe cystic lesion-incidentally found on imaging with [facility].  #Disposition: #  Proceed with Aranesp # in 2 weeks- cbc possible aranesp # in 4 weeks MD- cbc/bmp/LDH-possible aranesp-Dr.B

## 2019-06-24 ENCOUNTER — Other Ambulatory Visit: Payer: PPO

## 2019-06-24 ENCOUNTER — Ambulatory Visit: Payer: PPO | Admitting: Internal Medicine

## 2019-06-24 ENCOUNTER — Ambulatory Visit: Payer: PPO

## 2019-06-25 DIAGNOSIS — L02619 Cutaneous abscess of unspecified foot: Secondary | ICD-10-CM | POA: Diagnosis not present

## 2019-06-25 DIAGNOSIS — D500 Iron deficiency anemia secondary to blood loss (chronic): Secondary | ICD-10-CM | POA: Diagnosis not present

## 2019-06-25 DIAGNOSIS — L03119 Cellulitis of unspecified part of limb: Secondary | ICD-10-CM | POA: Diagnosis not present

## 2019-06-25 DIAGNOSIS — D696 Thrombocytopenia, unspecified: Secondary | ICD-10-CM | POA: Diagnosis not present

## 2019-06-25 DIAGNOSIS — I5022 Chronic systolic (congestive) heart failure: Secondary | ICD-10-CM | POA: Diagnosis not present

## 2019-06-25 DIAGNOSIS — I482 Chronic atrial fibrillation, unspecified: Secondary | ICD-10-CM | POA: Diagnosis not present

## 2019-06-26 DIAGNOSIS — I10 Essential (primary) hypertension: Secondary | ICD-10-CM | POA: Diagnosis not present

## 2019-06-26 DIAGNOSIS — I482 Chronic atrial fibrillation, unspecified: Secondary | ICD-10-CM | POA: Diagnosis not present

## 2019-06-26 DIAGNOSIS — I951 Orthostatic hypotension: Secondary | ICD-10-CM | POA: Diagnosis not present

## 2019-06-26 DIAGNOSIS — I5022 Chronic systolic (congestive) heart failure: Secondary | ICD-10-CM | POA: Diagnosis not present

## 2019-06-26 DIAGNOSIS — I255 Ischemic cardiomyopathy: Secondary | ICD-10-CM | POA: Diagnosis not present

## 2019-06-29 DIAGNOSIS — L97512 Non-pressure chronic ulcer of other part of right foot with fat layer exposed: Secondary | ICD-10-CM | POA: Diagnosis not present

## 2019-06-29 DIAGNOSIS — L97521 Non-pressure chronic ulcer of other part of left foot limited to breakdown of skin: Secondary | ICD-10-CM | POA: Diagnosis not present

## 2019-07-01 ENCOUNTER — Ambulatory Visit: Payer: PPO | Admitting: Internal Medicine

## 2019-07-01 ENCOUNTER — Other Ambulatory Visit: Payer: PPO

## 2019-07-01 ENCOUNTER — Ambulatory Visit: Payer: PPO

## 2019-07-03 ENCOUNTER — Other Ambulatory Visit: Payer: Self-pay | Admitting: *Deleted

## 2019-07-03 DIAGNOSIS — D500 Iron deficiency anemia secondary to blood loss (chronic): Secondary | ICD-10-CM

## 2019-07-03 DIAGNOSIS — D631 Anemia in chronic kidney disease: Secondary | ICD-10-CM

## 2019-07-06 ENCOUNTER — Other Ambulatory Visit: Payer: Self-pay

## 2019-07-06 ENCOUNTER — Inpatient Hospital Stay: Payer: PPO

## 2019-07-06 DIAGNOSIS — D631 Anemia in chronic kidney disease: Secondary | ICD-10-CM

## 2019-07-06 DIAGNOSIS — D500 Iron deficiency anemia secondary to blood loss (chronic): Secondary | ICD-10-CM

## 2019-07-06 DIAGNOSIS — I129 Hypertensive chronic kidney disease with stage 1 through stage 4 chronic kidney disease, or unspecified chronic kidney disease: Secondary | ICD-10-CM | POA: Diagnosis not present

## 2019-07-06 LAB — CBC WITH DIFFERENTIAL/PLATELET
Abs Immature Granulocytes: 0.28 10*3/uL — ABNORMAL HIGH (ref 0.00–0.07)
Basophils Absolute: 0 10*3/uL (ref 0.0–0.1)
Basophils Relative: 0 %
Eosinophils Absolute: 0 10*3/uL (ref 0.0–0.5)
Eosinophils Relative: 0 %
HCT: 31.9 % — ABNORMAL LOW (ref 39.0–52.0)
Hemoglobin: 10.1 g/dL — ABNORMAL LOW (ref 13.0–17.0)
Immature Granulocytes: 2 %
Lymphocytes Relative: 12 %
Lymphs Abs: 1.8 10*3/uL (ref 0.7–4.0)
MCH: 33.9 pg (ref 26.0–34.0)
MCHC: 31.7 g/dL (ref 30.0–36.0)
MCV: 107 fL — ABNORMAL HIGH (ref 80.0–100.0)
Monocytes Absolute: 3 10*3/uL — ABNORMAL HIGH (ref 0.1–1.0)
Monocytes Relative: 20 %
Neutro Abs: 9.8 10*3/uL — ABNORMAL HIGH (ref 1.7–7.7)
Neutrophils Relative %: 66 %
Platelets: 184 10*3/uL (ref 150–400)
RBC: 2.98 MIL/uL — ABNORMAL LOW (ref 4.22–5.81)
RDW: 17.7 % — ABNORMAL HIGH (ref 11.5–15.5)
WBC: 14.9 10*3/uL — ABNORMAL HIGH (ref 4.0–10.5)
nRBC: 0 % (ref 0.0–0.2)

## 2019-07-09 DIAGNOSIS — R0600 Dyspnea, unspecified: Secondary | ICD-10-CM | POA: Diagnosis not present

## 2019-07-13 DIAGNOSIS — L97521 Non-pressure chronic ulcer of other part of left foot limited to breakdown of skin: Secondary | ICD-10-CM | POA: Diagnosis not present

## 2019-07-13 DIAGNOSIS — L97512 Non-pressure chronic ulcer of other part of right foot with fat layer exposed: Secondary | ICD-10-CM | POA: Diagnosis not present

## 2019-07-17 ENCOUNTER — Other Ambulatory Visit: Payer: Self-pay

## 2019-07-20 ENCOUNTER — Other Ambulatory Visit: Payer: Self-pay

## 2019-07-20 ENCOUNTER — Inpatient Hospital Stay: Payer: PPO | Attending: Internal Medicine

## 2019-07-20 ENCOUNTER — Inpatient Hospital Stay: Payer: PPO

## 2019-07-20 ENCOUNTER — Inpatient Hospital Stay (HOSPITAL_BASED_OUTPATIENT_CLINIC_OR_DEPARTMENT_OTHER): Payer: PPO | Admitting: Internal Medicine

## 2019-07-20 DIAGNOSIS — N183 Chronic kidney disease, stage 3 unspecified: Secondary | ICD-10-CM | POA: Diagnosis not present

## 2019-07-20 DIAGNOSIS — I429 Cardiomyopathy, unspecified: Secondary | ICD-10-CM | POA: Diagnosis not present

## 2019-07-20 DIAGNOSIS — I252 Old myocardial infarction: Secondary | ICD-10-CM | POA: Insufficient documentation

## 2019-07-20 DIAGNOSIS — Z87891 Personal history of nicotine dependence: Secondary | ICD-10-CM | POA: Diagnosis not present

## 2019-07-20 DIAGNOSIS — D696 Thrombocytopenia, unspecified: Secondary | ICD-10-CM | POA: Diagnosis not present

## 2019-07-20 DIAGNOSIS — Z7982 Long term (current) use of aspirin: Secondary | ICD-10-CM | POA: Insufficient documentation

## 2019-07-20 DIAGNOSIS — I2510 Atherosclerotic heart disease of native coronary artery without angina pectoris: Secondary | ICD-10-CM | POA: Diagnosis not present

## 2019-07-20 DIAGNOSIS — R911 Solitary pulmonary nodule: Secondary | ICD-10-CM | POA: Insufficient documentation

## 2019-07-20 DIAGNOSIS — D631 Anemia in chronic kidney disease: Secondary | ICD-10-CM

## 2019-07-20 DIAGNOSIS — D500 Iron deficiency anemia secondary to blood loss (chronic): Secondary | ICD-10-CM

## 2019-07-20 DIAGNOSIS — F419 Anxiety disorder, unspecified: Secondary | ICD-10-CM | POA: Insufficient documentation

## 2019-07-20 DIAGNOSIS — I129 Hypertensive chronic kidney disease with stage 1 through stage 4 chronic kidney disease, or unspecified chronic kidney disease: Secondary | ICD-10-CM | POA: Insufficient documentation

## 2019-07-20 DIAGNOSIS — I509 Heart failure, unspecified: Secondary | ICD-10-CM | POA: Diagnosis not present

## 2019-07-20 DIAGNOSIS — I10 Essential (primary) hypertension: Secondary | ICD-10-CM | POA: Insufficient documentation

## 2019-07-20 LAB — BASIC METABOLIC PANEL
Anion gap: 11 (ref 5–15)
BUN: 30 mg/dL — ABNORMAL HIGH (ref 8–23)
CO2: 21 mmol/L — ABNORMAL LOW (ref 22–32)
Calcium: 8.9 mg/dL (ref 8.9–10.3)
Chloride: 103 mmol/L (ref 98–111)
Creatinine, Ser: 1.82 mg/dL — ABNORMAL HIGH (ref 0.61–1.24)
GFR calc Af Amer: 38 mL/min — ABNORMAL LOW (ref >60)
GFR calc non Af Amer: 33 mL/min — ABNORMAL LOW (ref >60)
Glucose, Bld: 123 mg/dL — ABNORMAL HIGH (ref 70–99)
Potassium: 4.1 mmol/L (ref 3.5–5.1)
Sodium: 135 mmol/L (ref 135–145)

## 2019-07-20 LAB — CBC WITH DIFFERENTIAL/PLATELET
Abs Immature Granulocytes: 0.43 10*3/uL — ABNORMAL HIGH (ref 0.00–0.07)
Basophils Absolute: 0 10*3/uL (ref 0.0–0.1)
Basophils Relative: 0 %
Eosinophils Absolute: 0 10*3/uL (ref 0.0–0.5)
Eosinophils Relative: 0 %
HCT: 31.8 % — ABNORMAL LOW (ref 39.0–52.0)
Hemoglobin: 9.7 g/dL — ABNORMAL LOW (ref 13.0–17.0)
Immature Granulocytes: 3 %
Lymphocytes Relative: 20 %
Lymphs Abs: 2.5 10*3/uL (ref 0.7–4.0)
MCH: 33.2 pg (ref 26.0–34.0)
MCHC: 30.5 g/dL (ref 30.0–36.0)
MCV: 108.9 fL — ABNORMAL HIGH (ref 80.0–100.0)
Monocytes Absolute: 2.5 10*3/uL — ABNORMAL HIGH (ref 0.1–1.0)
Monocytes Relative: 20 %
Neutro Abs: 7.1 10*3/uL (ref 1.7–7.7)
Neutrophils Relative %: 57 %
Platelets: 182 10*3/uL (ref 150–400)
RBC: 2.92 MIL/uL — ABNORMAL LOW (ref 4.22–5.81)
RDW: 17.5 % — ABNORMAL HIGH (ref 11.5–15.5)
WBC: 12.5 10*3/uL — ABNORMAL HIGH (ref 4.0–10.5)
nRBC: 0 % (ref 0.0–0.2)

## 2019-07-20 LAB — LACTATE DEHYDROGENASE: LDH: 185 U/L (ref 98–192)

## 2019-07-20 MED ORDER — DARBEPOETIN ALFA 300 MCG/0.6ML IJ SOSY
300.0000 ug | PREFILLED_SYRINGE | Freq: Once | INTRAMUSCULAR | Status: AC
  Administered 2019-07-20: 300 ug via SUBCUTANEOUS
  Filled 2019-07-20: qty 0.6

## 2019-07-20 NOTE — Assessment & Plan Note (Addendum)
#  Anemia-mild.  Since 2012.  Suspect MDS/chronic kidney disease.  No bone marrow biopsy.  STABLE.  Again had a long discussion with the patient/daughter regarding the concerns of MDS versus CKD.  However currently the treatment would not change.  As patient is clinically stable I think is reasonable to hold off bone marrow biopsy at this time.  # Hemoglobin 9.7;proceed with today.  Proceed with Aranesp today.  Continue p.o. iron.  # Mild intermittent thrombocytopenia-question ITP [nadir November 2020-80s].  Today platelets 184- ok to with asprin. STABLE.   #Recent acute CHF-2025% ejection fraction [[facility]]-see below ;on  Lasix 20 mg a day [half pill once a day]; followed by cardiology/PCP this week  #CKD stage III- Stable.   #Left upper lobe cystic lesion-incidentally found on imaging with [facility]; December 2020 CT scan-left upper lobe cystic lesion stable; nodular right posterior lesion-atelectasis versus others.  Recommend imaging at next visit.  #Disposition: #  Proceed with Aranesp # in 2 weeks- cbc possible aranesp # # in 4 weeks- cbc possible aranesp # in 6 weeks MD- cbc/bmp/LDH/ iron studies/ferritin-possible aranesp-Dr.B

## 2019-07-20 NOTE — Progress Notes (Signed)
[place] NOTE  Patient Care Team: Rusty Aus, MD as PCP - General (Internal Medicine) Alisa Graff, FNP as Nurse Practitioner (Family Medicine) Ubaldo Glassing Javier Docker, MD as Consulting Physician (Cardiology) Erby Pian, MD as Referring Physician (Specialist) Cammie Sickle, MD as Consulting Physician (Hematology and Oncology)  CHIEF COMPLAINTS/PURPOSE OF CONSULTATION:   # 2012- Anemia 11-12- sec to CKD/IDA? MDS [no BMBx] [March 2017-sat-11%; ferritin-42] [Feb 2017- EGD/colo-July 2015 Dr.Skulskie];April 2017-PO Iron PO; May 2020-IV iron/Aranesp.  # Intermittent thrombocytopenia 120s-140s [Dec 2016-CT- neg cirrhosis/splenomegaly]  #June 2020 -LUL cystic lesion- 4x4cm ?  Question etiology [slightly increased from 2017]; question postinflammatory versus low-grade adenocarcinoma-repeat scan in 6 months [December 2020]  # CKD [creatinine 1.2- 1.4]; CAD/CHF  HISTORY OF PRESENTING ILLNESS:  Andres Gutierrez 86 y.o.  male history of above history of chronic anemia and intermittent thrombocytopenia is here for follow-up/ currently on IV iron and also Aranesp.  Patient states his breathing is improved.  He continues to be on Lasix half a pill every day.  Actively managed by cardiology.  No nausea no vomiting.  Appetite is fair.  No blood in stools or black or stools.  Review of Systems  Constitutional: Positive for malaise/fatigue. Negative for chills, diaphoresis, fever and weight loss.  HENT: Negative for nosebleeds and sore throat.   Eyes: Negative for double vision.  Respiratory: Negative for cough, hemoptysis, sputum production, shortness of breath and wheezing.   Cardiovascular: Negative for chest pain, palpitations, orthopnea and leg swelling.  Gastrointestinal: Negative for abdominal pain, blood in stool, constipation, diarrhea, heartburn, melena, nausea and vomiting.  Genitourinary: Negative for dysuria, frequency and urgency.  Musculoskeletal:  Positive for back pain and joint pain.  Skin: Negative.  Negative for itching and rash.  Neurological: Negative for dizziness, tingling, focal weakness, weakness and headaches.  Endo/Heme/Allergies: Does not bruise/bleed easily.  Psychiatric/Behavioral: Negative for depression. The patient is not nervous/anxious and does not have insomnia.      MEDICAL HISTORY:  Past Medical History:  Diagnosis Date  . Anemia   . Anxiety   . Cardiomyopathy ([place])   . CHF (congestive heart failure) ([place])   . Chronic kidney disease    kidney stones  . Coronary artery disease   . Cough   . Hypertension   . Lymphadenopathy, hilar   . MI (myocardial infarction) ([place])    x 2  . Wheezing     SURGICAL HISTORY: Past Surgical History:  Procedure Laterality Date  . BRONCHIAL NEEDLE ASPIRATION BIOPSY N/A 12/31/2014   Procedure: BRONCHIAL NEEDLE ASPIRATION BIOPSIES from carina;  Surgeon: Flora Lipps, MD;  Location: [facility] OR[facility];  Service: Cardiopulmonary;  Laterality: N/A;  . CARDIAC CATHETERIZATION N/A 12/28/2015   Procedure: Left Heart Cath and Cors/Grafts Angiography;  Surgeon: Yolonda Kida, MD;  Location: [facility];  Service: Cardiovascular;  Laterality: N/A;  . CATARACT EXTRACTION W/PHACO Left 08/01/2016   Procedure: CATARACT EXTRACTION PHACO AND INTRAOCULAR LENS PLACEMENT ([place]) Left;  Surgeon: Leandrew Koyanagi, MD;  Location: [facility];  Service: Ophthalmology;  Laterality: Left;  . CORONARY ANGIOPLASTY WITH STENT PLACEMENT    . CORONARY ARTERY BYPASS GRAFT    . ENDOBRONCHIAL ULTRASOUND N/A 12/31/2014   Procedure: ENDOBRONCHIAL ULTRASOUND;  Surgeon: Flora Lipps, MD;  Location: [facility];  Service: Cardiopulmonary;  Laterality: N/A;  . ESOPHAGOGASTRODUODENOSCOPY (EGD) WITH PROPOFOL N/A 06/20/2015   Procedure: ESOPHAGOGASTRODUODENOSCOPY (EGD) WITH PROPOFOL;  Surgeon: Lollie Sails, MD;  Location: [facility];  Service: Endoscopy;  Laterality:  N/A;    SOCIAL HISTORY: lives in  [place]; alone. 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  Social History   Socioeconomic History  . Marital status: Widowed    Spouse name: Not on file  . Number of children: 2  . Years of education: college  . Highest education level: Some college, no degree  Occupational History  . Occupation: retired  Tobacco Use  . Smoking status: Former Smoker    Quit date: 06/27/1977    Years since quitting: 42.0  . Smokeless tobacco: Current User    Types: Chew  Substance and Sexual Activity  . Alcohol use: Yes    Comment: occational almost rare once a year  . Drug use: No  . Sexual activity: Never    Birth control/protection: Abstinence  Other Topics Concern  . Not on file  Social History Narrative  . Not on file   Social Determinants of Health   Financial Resource Strain:   . Difficulty of Paying Living Expenses: Not on file  Food Insecurity:   . Worried About Charity fundraiser in the Last Year: Not on file  . Ran Out of Food in the Last Year: Not on file  Transportation Needs:   . Lack of Transportation (Medical): Not on file  . Lack of Transportation (Non-Medical): Not on file  Physical Activity:   . Days of Exercise per Week: Not on file  . Minutes of Exercise per Session: Not on file  Stress:   . Feeling of Stress : Not on file  Social Connections:   . Frequency of Communication with Friends and Family: Not on file  . Frequency of Social Gatherings with Friends and Family: Not on file  . Attends Religious Services: Not on file  . Active Member of Clubs or Organizations: Not on file  . Attends Archivist Meetings: Not on file  . Marital Status: Not on file  Intimate Partner Violence:   . Fear of Current or Ex-Partner: Not on file  . Emotionally Abused: Not on file  . Physically Abused: Not on file  . Sexually Abused: Not on file    FAMILY HISTORY: Family History  Problem Relation Age of Onset  . CAD Mother   . Colon cancer Father     ALLERGIES:  is allergic to  5ht3 receptor antagonists; diphenhydramine hcl; escitalopram; maxidex [dexamethasone]; prednisone; serotonin; vytorin [ezetimibe-simvastatin]; zocor [simvastatin]; and diltiazem.  MEDICATIONS:  Current Outpatient Medications  Medication Sig Dispense Refill  . acetaminophen (TYLENOL) 500 MG tablet Take 500-1,000 mg by mouth every 6 (six) hours as needed for mild pain, moderate pain or fever.     Marland Kitchen aspirin EC 81 MG EC tablet Take 1 tablet (81 mg total) by mouth daily.    Marland Kitchen atorvastatin (LIPITOR) 40 MG tablet Take 1 tablet (40 mg total) by mouth daily at 6 PM. 30 tablet 0  . digoxin (LANOXIN) 0.125 MG tablet Take 0.0625 mg by mouth daily.    Marland Kitchen docusate sodium (COLACE) 100 MG capsule Take 100 mg by mouth daily as needed for mild constipation.     . famotidine (PEPCID) 20 MG tablet Take 20 mg by mouth at bedtime.    . ferrous sulfate 325 (65 FE) MG tablet Take 1 tablet (325 mg total) by mouth every other day. (Patient taking differently: Take 325 mg by mouth every Monday, Wednesday, and Friday. ) 30 tablet 3  . furosemide (LASIX) 40 MG tablet Take 1 tablet (40 mg total) by mouth daily. [place]  tablet 3  . metoprolol tartrate (LOPRESSOR) 25 MG tablet Take 0.5 tablets (12.5 mg total) by mouth 2 (two) times daily. 30 tablet 0  . nitroGLYCERIN (NITROSTAT) 0.4 MG SL tablet Place 0.4 mg under the tongue every 5 (five) minutes as needed for chest pain.    . polyethylene glycol (MIRALAX / GLYCOLAX) 17 g packet Take 17 g by mouth daily as needed for mild constipation. 14 each 0  . ranolazine (RANEXA) 1000 MG SR tablet Take 500 mg by mouth 2 (two) times daily.     . sucralfate (CARAFATE) 1 G tablet Take 1 g by mouth 2 (two) times daily.     . tamsulosin (FLOMAX) 0.4 MG CAPS capsule Take 0.4 mg by mouth daily after supper.     . TRELEGY ELLIPTA 100-62.5-25 MCG/INH AEPB Inhale 1 puff into the lungs daily.    . vitamin B-12 (CYANOCOBALAMIN) 500 MCG tablet Take 500 mcg by mouth daily.    . vitamin C (VITAMIN C) 250 MG  tablet Take 1 tablet (250 mg total) by mouth daily. 30 tablet 0   No current facility-administered medications for this visit.   Facility-Administered Medications Ordered in Other Visits  Medication Dose Route Frequency Provider Last Rate Last Admin  . Darbepoetin Alfa (ARANESP) injection 300 mcg  300 mcg Subcutaneous Once Cammie Sickle, MD         PHYSICAL EXAMINATION: ECOG PERFORMANCE STATUS: 0 - Asymptomatic  Vitals:   07/20/19 1047  BP: 137/65  Pulse: 88  Resp: 18  Temp: (!) 97.1 F (36.2 C)  SpO2: 100%   Filed Weights   07/20/19 1047  Weight: 204 lb (92.5 kg)    Physical Exam  Constitutional: He is oriented to person, place, and time.  Elderly Caucasian male patient. He accompanied by his daughter.  Walking by with a walker.  HENT:  Head: Normocephalic and atraumatic.  Mouth/Throat: Oropharynx is clear and moist. No oropharyngeal exudate.  Eyes: Pupils are equal, round, and reactive to light.  Cardiovascular: Normal rate and regular rhythm.  Pulmonary/Chest: Breath sounds normal. No respiratory distress. He has no wheezes.  Abdominal: Soft. Bowel sounds are normal. He exhibits no distension and no mass. There is no abdominal tenderness. There is no rebound and no guarding.  Musculoskeletal:        General: No tenderness or edema. Normal range of motion.     Cervical back: Normal range of motion and neck supple.  Neurological: He is alert and oriented to person, place, and time.  Skin: Skin is warm.  Psychiatric: Affect normal.   .   LABORATORY DATA:  I have reviewed the data as listed Lab Results  Component Value Date   WBC 12.5 (H) 07/20/2019   HGB 9.7 (L) 07/20/2019   HCT 31.8 (L) 07/20/2019   MCV 108.9 (H) 07/20/2019   PLT 182 07/20/2019   Recent Labs    01/07/19 0711 01/08/19 0428 05/27/19 1402 05/27/19 1402 06/07/19 1453 06/08/19 0100 06/16/19 1419 06/22/19 1003 07/20/19 1014  NA 140   < > 137   < > 138   < > 136 135 135  K 4.3   <  > 4.4   < > 4.1   < > 4.1 4.0 4.1  CL 109   < > 107   < > 109   < > 104 100 103  CO2 21*   < > 20*   < > 23   < > 21* 22 21*  GLUCOSE 99   < >  112*   < > 111*   < > 141* 156* 123*  BUN 24*   < > 42*   < > 32*   < > 56* 47* 30*  CREATININE 1.51*   < > 1.65*   < > 1.62*   < > 2.18* 2.31* 1.82*  CALCIUM 8.8*   < > 9.3   < > 9.0   < > 9.4 9.2 8.9  GFRNONAA 42*   < > 37*   < > 38*   < > 26* 25* 33*  GFRAA 48*   < > 43*   < > 44*   < > 31* 29* 38*  PROT 7.8   < > 7.7  --  7.5  --  7.9  --   --   ALBUMIN 3.8   < > 4.7  --  4.0  --  4.7  --   --   AST 21   < > 18  --  29  --  20  --   --   ALT 15   < > 20  --  24  --  25  --   --   ALKPHOS 59   < > 51  --  60  --  64  --   --   BILITOT 0.9   < > 1.0  --  1.0  --  1.1  --   --   BILIDIR 0.2  --   --   --   --   --   --   --   --   IBILI 0.7  --   --   --   --   --   --   --   --    < > = values in this interval not displayed.   ASSESSMENT & PLAN:   Anemia of chronic kidney failure, stage 3 (moderate) ([place]) # Anemia-mild.  Since 2012.  Suspect MDS/chronic kidney disease.  No bone marrow biopsy.  STABLE.  Again had a long discussion with the patient/daughter regarding the concerns of MDS versus CKD.  However currently the treatment would not change.  As patient is clinically stable I think is reasonable to hold off bone marrow biopsy at this time.  # Hemoglobin 9.7;proceed with today.  Proceed with Aranesp today.  Continue p.o. iron.  # Mild intermittent thrombocytopenia-question ITP [nadir November 2020-80s].  Today platelets 184- ok to with asprin. STABLE.   #Recent acute CHF-2025% ejection fraction [[facility]]-see below ;on  Lasix 20 mg a day [half pill once a day]; followed by cardiology/PCP this week  #CKD stage III- Stable.   #Left upper lobe cystic lesion-incidentally found on imaging with [facility]; December 2020 CT scan-left upper lobe cystic lesion stable; nodular right posterior lesion-atelectasis versus others.  Recommend imaging at next  visit.  #Disposition: #  Proceed with Aranesp # in 2 weeks- cbc possible aranesp # # in 4 weeks- cbc possible aranesp # in 6 weeks MD- cbc/bmp/LDH/ iron studies/ferritin-possible aranesp-Dr.B      Cammie Sickle, MD 07/20/2019 11:31 AM

## 2019-07-29 ENCOUNTER — Other Ambulatory Visit: Payer: Self-pay | Admitting: Diagnostic Radiology

## 2019-07-29 ENCOUNTER — Inpatient Hospital Stay: Payer: PPO

## 2019-07-29 ENCOUNTER — Other Ambulatory Visit: Payer: Self-pay

## 2019-07-29 ENCOUNTER — Emergency Department: Payer: PPO

## 2019-07-29 ENCOUNTER — Inpatient Hospital Stay
Admission: EM | Admit: 2019-07-29 | Discharge: 2019-08-02 | DRG: 871 | Disposition: A | Discharge status: Home Health | Payer: PPO | Attending: Pulmonary Disease | Admitting: Pulmonary Disease

## 2019-07-29 DIAGNOSIS — N1832 Chronic kidney disease, stage 3b: Secondary | ICD-10-CM | POA: Diagnosis present

## 2019-07-29 DIAGNOSIS — Z800 Family history of malignant neoplasm of digestive organs: Secondary | ICD-10-CM | POA: Diagnosis not present

## 2019-07-29 DIAGNOSIS — I959 Hypotension, unspecified: Secondary | ICD-10-CM | POA: Diagnosis not present

## 2019-07-29 DIAGNOSIS — K810 Acute cholecystitis: Secondary | ICD-10-CM | POA: Diagnosis not present

## 2019-07-29 DIAGNOSIS — Z79899 Other long term (current) drug therapy: Secondary | ICD-10-CM | POA: Diagnosis not present

## 2019-07-29 DIAGNOSIS — A419 Sepsis, unspecified organism: Principal | ICD-10-CM | POA: Diagnosis present

## 2019-07-29 DIAGNOSIS — Z20822 Contact with and (suspected) exposure to covid-19: Secondary | ICD-10-CM | POA: Diagnosis present

## 2019-07-29 DIAGNOSIS — Z6824 Body mass index (BMI) 24.0-24.9, adult: Secondary | ICD-10-CM | POA: Diagnosis not present

## 2019-07-29 DIAGNOSIS — R112 Nausea with vomiting, unspecified: Secondary | ICD-10-CM | POA: Diagnosis not present

## 2019-07-29 DIAGNOSIS — N189 Chronic kidney disease, unspecified: Secondary | ICD-10-CM | POA: Diagnosis not present

## 2019-07-29 DIAGNOSIS — R1013 Epigastric pain: Secondary | ICD-10-CM | POA: Diagnosis not present

## 2019-07-29 DIAGNOSIS — I5021 Acute systolic (congestive) heart failure: Secondary | ICD-10-CM | POA: Diagnosis not present

## 2019-07-29 DIAGNOSIS — I429 Cardiomyopathy, unspecified: Secondary | ICD-10-CM | POA: Diagnosis not present

## 2019-07-29 DIAGNOSIS — I509 Heart failure, unspecified: Secondary | ICD-10-CM | POA: Diagnosis not present

## 2019-07-29 DIAGNOSIS — K819 Cholecystitis, unspecified: Secondary | ICD-10-CM

## 2019-07-29 DIAGNOSIS — I5023 Acute on chronic systolic (congestive) heart failure: Secondary | ICD-10-CM | POA: Diagnosis present

## 2019-07-29 DIAGNOSIS — R1011 Right upper quadrant pain: Secondary | ICD-10-CM

## 2019-07-29 DIAGNOSIS — Z7982 Long term (current) use of aspirin: Secondary | ICD-10-CM | POA: Diagnosis not present

## 2019-07-29 DIAGNOSIS — R509 Fever, unspecified: Secondary | ICD-10-CM | POA: Diagnosis not present

## 2019-07-29 DIAGNOSIS — Z961 Presence of intraocular lens: Secondary | ICD-10-CM | POA: Diagnosis not present

## 2019-07-29 DIAGNOSIS — I252 Old myocardial infarction: Secondary | ICD-10-CM

## 2019-07-29 DIAGNOSIS — Z888 Allergy status to other drugs, medicaments and biological substances status: Secondary | ICD-10-CM

## 2019-07-29 DIAGNOSIS — Z9842 Cataract extraction status, left eye: Secondary | ICD-10-CM

## 2019-07-29 DIAGNOSIS — D701 Agranulocytosis secondary to cancer chemotherapy: Secondary | ICD-10-CM | POA: Diagnosis not present

## 2019-07-29 DIAGNOSIS — R6521 Severe sepsis with septic shock: Secondary | ICD-10-CM | POA: Diagnosis present

## 2019-07-29 DIAGNOSIS — I248 Other forms of acute ischemic heart disease: Secondary | ICD-10-CM | POA: Diagnosis not present

## 2019-07-29 DIAGNOSIS — N200 Calculus of kidney: Secondary | ICD-10-CM | POA: Diagnosis not present

## 2019-07-29 DIAGNOSIS — I2510 Atherosclerotic heart disease of native coronary artery without angina pectoris: Secondary | ICD-10-CM | POA: Diagnosis not present

## 2019-07-29 DIAGNOSIS — A4189 Other specified sepsis: Secondary | ICD-10-CM | POA: Diagnosis not present

## 2019-07-29 DIAGNOSIS — I130 Hypertensive heart and chronic kidney disease with heart failure and stage 1 through stage 4 chronic kidney disease, or unspecified chronic kidney disease: Secondary | ICD-10-CM | POA: Diagnosis present

## 2019-07-29 DIAGNOSIS — Z955 Presence of coronary angioplasty implant and graft: Secondary | ICD-10-CM

## 2019-07-29 DIAGNOSIS — D469 Myelodysplastic syndrome, unspecified: Secondary | ICD-10-CM | POA: Diagnosis present

## 2019-07-29 DIAGNOSIS — Z951 Presence of aortocoronary bypass graft: Secondary | ICD-10-CM

## 2019-07-29 DIAGNOSIS — R935 Abnormal findings on diagnostic imaging of other abdominal regions, including retroperitoneum: Secondary | ICD-10-CM | POA: Diagnosis not present

## 2019-07-29 DIAGNOSIS — F17220 Nicotine dependence, chewing tobacco, uncomplicated: Secondary | ICD-10-CM | POA: Diagnosis present

## 2019-07-29 DIAGNOSIS — D631 Anemia in chronic kidney disease: Secondary | ICD-10-CM | POA: Diagnosis present

## 2019-07-29 DIAGNOSIS — D72829 Elevated white blood cell count, unspecified: Secondary | ICD-10-CM | POA: Diagnosis not present

## 2019-07-29 DIAGNOSIS — Z87442 Personal history of urinary calculi: Secondary | ICD-10-CM

## 2019-07-29 DIAGNOSIS — J189 Pneumonia, unspecified organism: Secondary | ICD-10-CM | POA: Diagnosis not present

## 2019-07-29 DIAGNOSIS — E440 Moderate protein-calorie malnutrition: Secondary | ICD-10-CM | POA: Diagnosis not present

## 2019-07-29 DIAGNOSIS — R570 Cardiogenic shock: Secondary | ICD-10-CM | POA: Diagnosis present

## 2019-07-29 DIAGNOSIS — D696 Thrombocytopenia, unspecified: Secondary | ICD-10-CM | POA: Diagnosis present

## 2019-07-29 DIAGNOSIS — Z8249 Family history of ischemic heart disease and other diseases of the circulatory system: Secondary | ICD-10-CM

## 2019-07-29 DIAGNOSIS — K828 Other specified diseases of gallbladder: Secondary | ICD-10-CM | POA: Diagnosis not present

## 2019-07-29 DIAGNOSIS — I499 Cardiac arrhythmia, unspecified: Secondary | ICD-10-CM | POA: Diagnosis not present

## 2019-07-29 DIAGNOSIS — J984 Other disorders of lung: Secondary | ICD-10-CM | POA: Diagnosis present

## 2019-07-29 DIAGNOSIS — D72828 Other elevated white blood cell count: Secondary | ICD-10-CM

## 2019-07-29 DIAGNOSIS — Z4659 Encounter for fitting and adjustment of other gastrointestinal appliance and device: Secondary | ICD-10-CM | POA: Diagnosis not present

## 2019-07-29 DIAGNOSIS — I129 Hypertensive chronic kidney disease with stage 1 through stage 4 chronic kidney disease, or unspecified chronic kidney disease: Secondary | ICD-10-CM | POA: Diagnosis not present

## 2019-07-29 LAB — COMPREHENSIVE METABOLIC PANEL
ALT: 19 U/L (ref 0–44)
ALT: 18 U/L (ref 0–44)
AST: 20 U/L (ref 15–41)
AST: 36 U/L (ref 15–41)
Albumin: 4 g/dL (ref 3.5–5.0)
Albumin: 3.8 g/dL (ref 3.5–5.0)
Alkaline Phosphatase: 58 U/L (ref 38–126)
Alkaline Phosphatase: 55 U/L (ref 38–126)
Anion gap: 13 (ref 5–15)
Anion gap: 8 (ref 5–15)
BUN: 30 mg/dL — ABNORMAL HIGH (ref 8–23)
BUN: 27 mg/dL — ABNORMAL HIGH (ref 8–23)
CO2: 23 mmol/L (ref 22–32)
CO2: 24 mmol/L (ref 22–32)
Calcium: 8.9 mg/dL (ref 8.9–10.3)
Calcium: 8.5 mg/dL — ABNORMAL LOW (ref 8.9–10.3)
Chloride: 104 mmol/L (ref 98–111)
Chloride: 106 mmol/L (ref 98–111)
Creatinine, Ser: 1.95 mg/dL — ABNORMAL HIGH (ref 0.61–1.24)
Creatinine, Ser: 1.95 mg/dL — ABNORMAL HIGH (ref 0.61–1.24)
GFR calc Af Amer: 35 mL/min — ABNORMAL LOW (ref >60)
GFR calc Af Amer: 35 mL/min — ABNORMAL LOW (ref >60)
GFR calc non Af Amer: 30 mL/min — ABNORMAL LOW (ref >60)
GFR calc non Af Amer: 30 mL/min — ABNORMAL LOW (ref >60)
Glucose, Bld: 122 mg/dL — ABNORMAL HIGH (ref 70–99)
Glucose, Bld: 108 mg/dL — ABNORMAL HIGH (ref 70–99)
Potassium: 4.2 mmol/L (ref 3.5–5.1)
Potassium: 3.9 mmol/L (ref 3.5–5.1)
Sodium: 140 mmol/L (ref 135–145)
Sodium: 138 mmol/L (ref 135–145)
Total Bilirubin: 1.3 mg/dL — ABNORMAL HIGH (ref 0.3–1.2)
Total Bilirubin: 1.9 mg/dL — ABNORMAL HIGH (ref 0.3–1.2)
Total Protein: 7.4 g/dL (ref 6.5–8.1)
Total Protein: 6.8 g/dL (ref 6.5–8.1)

## 2019-07-29 LAB — CBC WITH DIFFERENTIAL/PLATELET
Abs Immature Granulocytes: 0.24 10*3/uL — ABNORMAL HIGH (ref 0.00–0.07)
Basophils Absolute: 0 10*3/uL (ref 0.0–0.1)
Basophils Relative: 0 %
Eosinophils Absolute: 0 10*3/uL (ref 0.0–0.5)
Eosinophils Relative: 0 %
HCT: 29.4 % — ABNORMAL LOW (ref 39.0–52.0)
Hemoglobin: 9.3 g/dL — ABNORMAL LOW (ref 13.0–17.0)
Immature Granulocytes: 1 %
Lymphocytes Relative: 15 %
Lymphs Abs: 2.5 10*3/uL (ref 0.7–4.0)
MCH: 33.7 pg (ref 26.0–34.0)
MCHC: 31.6 g/dL (ref 30.0–36.0)
MCV: 106.5 fL — ABNORMAL HIGH (ref 80.0–100.0)
Monocytes Absolute: 3.8 10*3/uL — ABNORMAL HIGH (ref 0.1–1.0)
Monocytes Relative: 23 %
Neutro Abs: 10.1 10*3/uL — ABNORMAL HIGH (ref 1.7–7.7)
Neutrophils Relative %: 61 %
Platelets: 149 10*3/uL — ABNORMAL LOW (ref 150–400)
RBC: 2.76 MIL/uL — ABNORMAL LOW (ref 4.22–5.81)
RDW: 17.5 % — ABNORMAL HIGH (ref 11.5–15.5)
Smear Review: UNDETERMINED
WBC: 16.7 10*3/uL — ABNORMAL HIGH (ref 4.0–10.5)
nRBC: 0.2 % (ref 0.0–0.2)

## 2019-07-29 LAB — GLUCOSE, CAPILLARY: Glucose-Capillary: 106 mg/dL — ABNORMAL HIGH (ref 70–99)

## 2019-07-29 LAB — LACTIC ACID, PLASMA
Lactic Acid, Venous: 1.6 mmol/L (ref 0.5–1.9)
Lactic Acid, Venous: 3.2 mmol/L (ref 0.5–1.9)
Lactic Acid, Venous: 3 mmol/L (ref 0.5–1.9)
Lactic Acid, Venous: 1.8 mmol/L (ref 0.5–1.9)

## 2019-07-29 LAB — RESPIRATORY PANEL BY RT PCR (FLU A&B, COVID)
Influenza A by PCR: NEGATIVE
Influenza B by PCR: NEGATIVE
SARS Coronavirus 2 by RT PCR: NEGATIVE

## 2019-07-29 LAB — URINALYSIS, COMPLETE (UACMP) WITH MICROSCOPIC
Bacteria, UA: NONE SEEN
Bilirubin Urine: NEGATIVE
Glucose, UA: NEGATIVE mg/dL
Hgb urine dipstick: NEGATIVE
Ketones, ur: NEGATIVE mg/dL
Leukocytes,Ua: NEGATIVE
Nitrite: NEGATIVE
Protein, ur: 30 mg/dL — AB
Specific Gravity, Urine: 1.021 (ref 1.005–1.030)
pH: 5 (ref 5.0–8.0)

## 2019-07-29 LAB — LIPASE, BLOOD: Lipase: 29 U/L (ref 11–51)

## 2019-07-29 LAB — TROPONIN I (HIGH SENSITIVITY)
Troponin I (High Sensitivity): 28 ng/L — ABNORMAL HIGH (ref <18)
Troponin I (High Sensitivity): 25 ng/L — ABNORMAL HIGH (ref <18)

## 2019-07-29 LAB — PROCALCITONIN: Procalcitonin: <0.1 ng/mL

## 2019-07-29 MED ORDER — OMEPRAZOLE 20 MG PO CPDR: 20.0000 mg | DELAYED_RELEASE_CAPSULE | Freq: Every day | ORAL | 1 refills | Status: DC

## 2019-07-29 MED ORDER — MORPHINE SULFATE (PF) 4 MG/ML IV SOLN
4.0000 mg | Freq: Once | INTRAVENOUS | Status: AC
  Administered 2019-07-29: 4 mg via INTRAVENOUS
  Filled 2019-07-29: qty 1

## 2019-07-29 MED ORDER — MORPHINE SULFATE (PF) 4 MG/ML IV SOLN
3.5000 mg | Freq: Once | INTRAVENOUS | Status: AC
  Administered 2019-07-29: 3.5 mg via INTRAVENOUS
  Filled 2019-07-29: qty 1

## 2019-07-29 MED ORDER — RANOLAZINE ER 500 MG PO TB12
500.0000 mg | ORAL_TABLET | Freq: Two times a day (BID) | ORAL | Status: DC
  Administered 2019-07-29 – 2019-08-02 (×7): 500 mg via ORAL
  Filled 2019-07-29 (×11): qty 1

## 2019-07-29 MED ORDER — ACETAMINOPHEN 500 MG PO TABS
1000.0000 mg | ORAL_TABLET | Freq: Once | ORAL | Status: DC
  Filled 2019-07-29: qty 2

## 2019-07-29 MED ORDER — FAMOTIDINE 20 MG PO TABS: 20.0000 mg | ORAL_TABLET | Freq: Once | ORAL | Status: DC

## 2019-07-29 MED ORDER — METRONIDAZOLE IN NACL 5-0.79 MG/ML-% IV SOLN
500.0000 mg | Freq: Once | INTRAVENOUS | Status: AC
  Administered 2019-07-29: 500 mg via INTRAVENOUS
  Filled 2019-07-29: qty 100

## 2019-07-29 MED ORDER — NOREPINEPHRINE 4 MG/250ML-% IV SOLN
2.0000 ug/min | INTRAVENOUS | Status: DC
  Administered 2019-07-29: 4 ug/min via INTRAVENOUS
  Administered 2019-07-30: 5 ug/min via INTRAVENOUS
  Filled 2019-07-29 (×2): qty 250

## 2019-07-29 MED ORDER — TECHNETIUM TC 99M MEBROFENIN IV KIT
5.0000 | PACK | Freq: Once | INTRAVENOUS | Status: AC | PRN
  Administered 2019-07-29: 5.72 via INTRAVENOUS

## 2019-07-29 MED ORDER — PROMETHAZINE HCL 25 MG/ML IJ SOLN
12.5000 mg | Freq: Once | INTRAMUSCULAR | Status: AC
  Administered 2019-07-29: 12.5 mg via INTRAVENOUS

## 2019-07-29 MED ORDER — ACETAMINOPHEN 10 MG/ML IV SOLN
1000.0000 mg | INTRAVENOUS | Status: AC
  Administered 2019-07-29: 1000 mg via INTRAVENOUS
  Filled 2019-07-29: qty 100

## 2019-07-29 MED ORDER — SUCRALFATE 1 G PO TABS
1.0000 g | ORAL_TABLET | Freq: Two times a day (BID) | ORAL | Status: DC
  Administered 2019-07-29 – 2019-08-02 (×7): 1 g via ORAL
  Filled 2019-07-29 (×7): qty 1

## 2019-07-29 MED ORDER — IOHEXOL 300 MG/ML  SOLN
75.0000 mL | Freq: Once | INTRAMUSCULAR | Status: AC | PRN
  Administered 2019-07-29: 75 mL via INTRAVENOUS

## 2019-07-29 MED ORDER — LACTATED RINGERS IV BOLUS
500.0000 mL | Freq: Once | INTRAVENOUS | Status: AC
  Administered 2019-07-29: 500 mL via INTRAVENOUS

## 2019-07-29 MED ORDER — SODIUM CHLORIDE 0.9 % IV SOLN
2.0000 g | Freq: Once | INTRAVENOUS | Status: AC
  Administered 2019-07-29: 2 g via INTRAVENOUS
  Filled 2019-07-29: qty 2

## 2019-07-29 MED ORDER — SODIUM CHLORIDE 0.9 % IV BOLUS
500.0000 mL | Freq: Once | INTRAVENOUS | Status: AC
  Administered 2019-07-29: 500 mL via INTRAVENOUS

## 2019-07-29 MED ORDER — DOCUSATE SODIUM 100 MG PO CAPS: 100.0000 mg | ORAL_CAPSULE | Freq: Every day | ORAL | Status: DC | PRN

## 2019-07-29 MED ORDER — LACTATED RINGERS IV BOLUS
1000.0000 mL | Freq: Once | INTRAVENOUS | Status: AC
  Administered 2019-07-29: 1000 mL via INTRAVENOUS

## 2019-07-29 MED ORDER — PIPERACILLIN-TAZOBACTAM 3.375 G IVPB
3.3750 g | Freq: Three times a day (TID) | INTRAVENOUS | Status: DC
  Administered 2019-07-29 – 2019-08-02 (×11): 3.375 g via INTRAVENOUS
  Filled 2019-07-29 (×15): qty 50

## 2019-07-29 MED ORDER — DIGOXIN 125 MCG PO TABS
0.0625 mg | ORAL_TABLET | Freq: Every day | ORAL | Status: DC
  Administered 2019-07-31 – 2019-08-02 (×3): 0.0625 mg via ORAL
  Filled 2019-07-29 (×6): qty 0.5

## 2019-07-29 MED ORDER — ATORVASTATIN CALCIUM 20 MG PO TABS
40.0000 mg | ORAL_TABLET | Freq: Every day | ORAL | Status: DC
  Administered 2019-07-30 – 2019-08-01 (×3): 40 mg via ORAL
  Filled 2019-07-29 (×3): qty 2

## 2019-07-29 MED ORDER — SODIUM CHLORIDE 0.9 % IV SOLN
250.0000 mL | INTRAVENOUS | Status: DC
  Administered 2019-07-29 – 2019-07-30 (×2): 250 mL via INTRAVENOUS

## 2019-07-29 MED ORDER — ONDANSETRON HCL 4 MG/2ML IJ SOLN
INTRAMUSCULAR | Status: AC
  Filled 2019-07-29: qty 2

## 2019-07-29 MED ORDER — POLYETHYLENE GLYCOL 3350 17 G PO PACK: 17.0000 g | PACK | Freq: Every day | ORAL | Status: DC | PRN

## 2019-07-29 MED ORDER — TAMSULOSIN HCL 0.4 MG PO CAPS
0.4000 mg | ORAL_CAPSULE | Freq: Every day | ORAL | Status: DC
  Administered 2019-07-30 – 2019-08-01 (×3): 0.4 mg via ORAL
  Filled 2019-07-29 (×3): qty 1

## 2019-07-29 MED ORDER — ACETAMINOPHEN 325 MG PO TABS
650.0000 mg | ORAL_TABLET | Freq: Four times a day (QID) | ORAL | Status: DC | PRN
  Administered 2019-07-31: 650 mg via ORAL
  Filled 2019-07-29 (×2): qty 2

## 2019-07-29 MED ORDER — ALUM & MAG HYDROXIDE-SIMETH 200-200-20 MG/5ML PO SUSP
15.0000 mL | Freq: Once | ORAL | Status: AC
  Administered 2019-07-29: 15 mL via ORAL
  Filled 2019-07-29: qty 30

## 2019-07-29 MED ORDER — CHLORHEXIDINE GLUCONATE CLOTH 2 % EX PADS
6.0000 | MEDICATED_PAD | Freq: Every day | CUTANEOUS | Status: DC
  Administered 2019-07-30 – 2019-08-02 (×3): 6 via TOPICAL

## 2019-07-29 MED ORDER — METOPROLOL TARTRATE 25 MG PO TABS
12.5000 mg | ORAL_TABLET | Freq: Two times a day (BID) | ORAL | Status: DC
  Administered 2019-07-31 – 2019-08-02 (×5): 12.5 mg via ORAL
  Filled 2019-07-29 (×5): qty 1

## 2019-07-29 MED ORDER — HEPARIN SODIUM (PORCINE) 5000 UNIT/ML IJ SOLN
5000.0000 [IU] | Freq: Three times a day (TID) | INTRAMUSCULAR | Status: DC
  Administered 2019-07-29 – 2019-07-30 (×2): 5000 [IU] via SUBCUTANEOUS
  Filled 2019-07-29 (×2): qty 1

## 2019-07-29 MED ORDER — HYDROMORPHONE HCL 1 MG/ML IJ SOLN
0.5000 mg | Freq: Once | INTRAMUSCULAR | Status: AC
  Administered 2019-07-29: 0.5 mg via INTRAVENOUS
  Filled 2019-07-29: qty 1

## 2019-07-29 MED ORDER — FUROSEMIDE 20 MG PO TABS
20.0000 mg | ORAL_TABLET | Freq: Every day | ORAL | Status: DC
  Administered 2019-07-31 – 2019-08-02 (×3): 20 mg via ORAL
  Filled 2019-07-29 (×3): qty 1

## 2019-07-29 MED ORDER — PROMETHAZINE HCL 25 MG PO TABS
12.5000 mg | ORAL_TABLET | Freq: Four times a day (QID) | ORAL | Status: DC | PRN
  Filled 2019-07-29: qty 1

## 2019-07-29 MED ORDER — LIDOCAINE VISCOUS HCL 2 % MT SOLN
15.0000 mL | Freq: Once | OROMUCOSAL | Status: AC
  Administered 2019-07-29: 15 mL via ORAL
  Filled 2019-07-29: qty 15

## 2019-07-29 MED ORDER — ASPIRIN EC 81 MG PO TBEC
81.0000 mg | DELAYED_RELEASE_TABLET | Freq: Every day | ORAL | Status: DC
  Administered 2019-07-31 – 2019-08-02 (×3): 81 mg via ORAL
  Filled 2019-07-29 (×3): qty 1

## 2019-07-29 MED ORDER — PANTOPRAZOLE SODIUM 40 MG IV SOLR
40.0000 mg | Freq: Once | INTRAVENOUS | Status: AC
  Administered 2019-07-29: 40 mg via INTRAVENOUS
  Filled 2019-07-29: qty 40

## 2019-07-29 MED ORDER — FAMOTIDINE 20 MG PO TABS
20.0000 mg | ORAL_TABLET | Freq: Every day | ORAL | Status: DC
  Administered 2019-07-29 – 2019-08-01 (×4): 20 mg via ORAL
  Filled 2019-07-29 (×4): qty 1

## 2019-07-29 MED ORDER — NITROGLYCERIN 0.4 MG SL SUBL: 0.4000 mg | SUBLINGUAL_TABLET | SUBLINGUAL | Status: DC | PRN

## 2019-07-29 MED ORDER — OXYCODONE-ACETAMINOPHEN 5-325 MG PO TABS
1.0000 | ORAL_TABLET | Freq: Once | ORAL | Status: AC
  Administered 2019-07-29: 1 via ORAL
  Filled 2019-07-29: qty 1

## 2019-07-29 MED ORDER — HALOPERIDOL LACTATE 5 MG/ML IJ SOLN
2.5000 mg | Freq: Once | INTRAMUSCULAR | Status: AC
  Administered 2019-07-29: 2.5 mg via INTRAVENOUS
  Filled 2019-07-29: qty 1

## 2019-07-29 MED ORDER — ACETAMINOPHEN 650 MG RE SUPP: 650.0000 mg | Freq: Four times a day (QID) | RECTAL | Status: DC | PRN

## 2019-07-29 NOTE — ED Provider Notes (Signed)
.  Critical Care Performed by: Carrie Mew, MD Authorized by: Carrie Mew, MD   Critical care provider statement:    Critical care time (minutes):  35   Critical care time was exclusive of:  Separately billable procedures and treating other patients   Critical care was necessary to treat or prevent imminent or life-threatening deterioration of the following conditions:  Sepsis   Critical care was time spent personally by me on the following activities:  Development of treatment plan with patient or surrogate, discussions with consultants, evaluation of patient's response to treatment, examination of patient, obtaining history from patient or surrogate, ordering and performing treatments and interventions, ordering and review of laboratory studies, ordering and review of radiographic studies, pulse oximetry, re-evaluation of patient's condition and review of old charts   ----------------------------------------- 4:13 PM on 07/29/2019 -----------------------------------------  Patient still having vomiting and abdominal pain.  Ultrasound shows some degree of gallbladder wall thickening and biliary sludge, suggestive of possible acalculous cholecystitis.  Discussed with surgery who will evaluate.  We will plan to admit the patient to hospitalist service due to intractable abdominal pain and vomiting and management of his multiple comorbidities.  Repeat EKG obtained, interpreted by me Atrial fibrillation, rate of 100.  Normal axis and intervals.  Poor R wave progression.  Left bundle branch block.  No acute ischemic changes.  No evidence of toxic effect from multiple medications received.  ----------------------------------------- 5:28 PM on 07/29/2019 -----------------------------------------  On recheck of vitals 4:18 PM, patient was found to have a fever of 103.1.  Tachypneic as well.  Still normal blood pressure, slight tachycardia.  At this point, evolving clinical picture suggest  the patient is developing sepsis from possibly biliary source.  Blood cultures obtained, lactate repeated even though it was previously normal prior to onset of sepsis.  Cefepime and metronidazole ordered for empiric antibiotic coverage.  Case was discussed with surgery to evaluate for possible acalculous cholecystitis, will follow up recommendations.  We will plan to admit.  ----------------------------------------- 8:31 PM on 07/29/2019 -----------------------------------------  While receiving IV fluids, patient developed hypotension which has been persistent despite receiving a total of 3 L IV fluid so far.  Patient is going now to start HIDA scan and will be a nuclear medicine for an hour.  Will start Levophed to prevent further deterioration.        Carrie Mew, MD 07/29/19 2033

## 2019-07-29 NOTE — Consult Note (Signed)
Subjective:   CC: Abdominal pain, nausea vomiting  HPI:  Andres Gutierrez is a 86 y.o. male who is consulted by [facility] for evaluation of above cc.  Symptoms were first noted 1 day ago. Pain is sharp, sudden onset night.  Associated with nausea, vomiting, exacerbated by nothing specific.  Pain persisted so daughter brought him into the emergency department.  Tree mostly obtained per daughter due to patient somnolence after likely administration of Haldol.  Daughter does state that he has had similar episodes in the past with a full work-up of the gallbladder including HIDA scan which has been negative.     Past Medical History:  has a past medical history of Anemia, Anxiety, Cardiomyopathy ([place]), CHF (congestive heart failure) ([place]), Chronic kidney disease, Coronary artery disease, Cough, Hypertension, Lymphadenopathy, hilar, MI (myocardial infarction) ([place]), and Wheezing.  Past Surgical History:  has a past surgical history that includes Coronary artery bypass graft; Coronary angioplasty with stent; Endobronchial ultrasound (N/A, 12/31/2014); Bronchial needle aspiration biopsy (N/A, 12/31/2014); Esophagogastroduodenoscopy (egd) with propofol (N/A, 06/20/2015); Cardiac catheterization (N/A, 12/28/2015); and Cataract extraction w/PHACO (Left, 08/01/2016).  Family History: family history includes CAD in his mother; Colon cancer in his father.  Social History:  reports that he quit smoking about 42 years ago. His smokeless tobacco use includes chew. He reports current alcohol use. He reports that he does not use drugs.  Current Medications:  aspirin EC 81 MG EC tablet Take 1 tablet (81 mg total) by mouth daily. Jennye Boroughs, MD Reordered  Ordered as: aspirin EC tablet 81 mg - 81 mg, Oral, Daily, First dose on Thu 07/30/19 at 1000  atorvastatin (LIPITOR) 40 MG tablet Take 1 tablet (40 mg total) by mouth daily at 6 PM. Jennye Boroughs, MD Reordered  Ordered as: atorvastatin (LIPITOR) tablet 40 mg - 40 mg,  Oral, Daily-1800, First dose on Thu 07/30/19 at 1800  digoxin (LANOXIN) 0.125 MG tablet Take 0.0625 mg by mouth daily. Jennye Boroughs, MD Reordered  Ordered as: digoxin (LANOXIN) tablet 0.0625 mg - 0.0625 mg, Oral, Daily, First dose on Thu 07/30/19 at 1000  famotidine (PEPCID) 20 MG tablet Take 20 mg by mouth at bedtime. Jennye Boroughs, MD Reordered  Ordered as: famotidine (PEPCID) tablet 20 mg - 20 mg, Oral, Daily at bedtime, First dose on Wed 07/29/19 at 2200  ferrous sulfate 325 (65 FE) MG tablet Take 1 tablet (325 mg total) by mouth every other day. Jennye Boroughs, MD Not Ordered   Patient taking differently: Take 325 mg by mouth every Monday, Wednesday, and Friday.     furosemide (LASIX) 40 MG tablet Take 1 tablet (40 mg total) by mouth daily. Jennye Boroughs, MD Reordered   Patient taking differently: Take 20 mg by mouth daily.     Ordered as: furosemide (LASIX) tablet 20 mg - 20 mg, Oral, Daily, First dose on Thu 07/30/19 at 1000  metoprolol tartrate (LOPRESSOR) 25 MG tablet Take 0.5 tablets (12.5 mg total) by mouth 2 (two) times daily. Jennye Boroughs, MD Reordered  Ordered as: metoprolol tartrate (LOPRESSOR) tablet 12.5 mg - 12.5 mg, Oral, 2 times daily, First dose on Wed 07/29/19 at 2200  ranolazine (RANEXA) 1000 MG SR tablet Take 500 mg by mouth 2 (two) times daily.  Jennye Boroughs, MD Reordered  Ordered as: ranolazine (RANEXA) 12 hr tablet 500 mg - 500 mg, Oral, 2 times daily, First dose on Wed 07/29/19 at 2200  sucralfate (CARAFATE) 1 G tablet Take 1 g by mouth 2 (two) times daily.  Jennye Boroughs, MD Reordered  Ordered as: sucralfate (CARAFATE) tablet 1 g - 1 g, Oral, 2 times daily, First dose on Wed 07/29/19 at 2200  tamsulosin (FLOMAX) 0.4 MG CAPS capsule Take 0.4 mg by mouth daily after supper.  Jennye Boroughs, MD Reordered  Ordered as: tamsulosin [facility]) capsule 0.4 mg - 0.4 mg, Oral, Daily after supper, First dose on Thu 07/30/19 at 1800  vitamin B-12 (CYANOCOBALAMIN) 500 MCG tablet Take  500 mcg by mouth daily. Jennye Boroughs, MD Not Ordered  vitamin C (VITAMIN C) 250 MG tablet Take 1 tablet (250 mg total) by mouth daily. Jennye Boroughs, MD Not Ordered  acetaminophen (TYLENOL) 500 MG tablet Take 500-1,000 mg by mouth every 6 (six) hours as needed for mild pain, moderate pain or fever.  Jennye Boroughs, MD Not Ordered  docusate sodium (COLACE) 100 MG capsule Take 100 mg by mouth daily as needed for mild constipation.  Jennye Boroughs, MD Reordered  Ordered as: docusate sodium (COLACE) capsule 100 mg - 100 mg, Oral, Daily PRN, mild constipation, Starting Wed 07/29/19 at 1839  nitroGLYCERIN (NITROSTAT) 0.4 MG SL tablet Place 0.4 mg under the tongue every 5 (five) minutes as needed for chest pain. Jennye Boroughs, MD Reordered  Ordered as: nitroGLYCERIN (NITROSTAT) SL tablet 0.4 mg - 0.4 mg, Sublingual, Every 5 min PRN, chest pain, Starting Wed 07/29/19 at 1839  polyethylene glycol (MIRALAX / GLYCOLAX) 17 g packet Take 17 g by mouth daily as needed for mild constipation. Jennye Boroughs, MD Reordered  Ordered as: polyethylene glycol (MIRALAX / GLYCOLAX) packet 17 g - Use for Miralax or Glycolax 17 g, Oral, Daily PRN, mild constipation, Starting Wed 07/29/19 at [place]:  Allergies as of 07/29/2019 - Review Complete 07/29/2019  Allergen Reaction Noted  . Levaquin [levofloxacin]  07/29/2019  . 5ht3 receptor antagonists  01/24/2016  . Diphenhydramine hcl Other (See Comments) 08/16/2015  . Escitalopram Other (See Comments) 08/02/2015  . Maxidex [dexamethasone] Other (See Comments) 06/17/2015  . Prednisone Other (See Comments) 12/30/2014  . Serotonin Other (See Comments) 06/23/2015  . Vytorin [ezetimibe-simvastatin] Other (See Comments) 06/17/2015  . Zocor [simvastatin] Other (See Comments) 06/17/2015  . Diltiazem Rash 12/26/2014    ROS:  Unable to obtain due to patient condition    Objective:     BP (!) 86/40   Pulse 89   Temp 98.7 F (37.1 C) (Oral)   Resp (!) 24    Ht 6\' 4"  (1.93 m)   Wt 88.5 kg   SpO2 96%   BMI 23.74 kg/m    Constitutional :  appears stated age, no distress and Somnolent, arousable with gentle sternal rub but unable to answer questions before falling back asleep  Lymphatics/Throat:  no asymmetry, masses, or scars  Respiratory:  clear to auscultation bilaterally  Cardiovascular:  regular rate and rhythm  Gastrointestinal: soft, non-tender; bowel sounds normal; no masses,  no organomegaly.   Musculoskeletal: Steady movement  Skin: Cool and moist  Psychiatric: Normal affect, non-agitated, not confused       LABS:  CMP Latest Ref Rng & Units 07/29/2019 07/20/2019 06/22/2019  Glucose 70 - 99 mg/dL 122(H) 123(H) 156(H)  BUN 8 - 23 mg/dL 30(H) 30(H) 47(H)  Creatinine 0.61 - 1.24 mg/dL 1.95(H) 1.82(H) 2.31(H)  Sodium 135 - 145 mmol/L 140 135 135  Potassium 3.5 - 5.1 mmol/L 4.2 4.1 4.0  Chloride 98 - 111 mmol/L 104 103 100  CO2 22 - 32 mmol/L 23 21(L) 22  Calcium 8.9 -  10.3 mg/dL 8.9 8.9 9.2  Total Protein 6.5 - 8.1 g/dL 7.4 - -  Total Bilirubin 0.3 - 1.2 mg/dL 1.3(H) - -  Alkaline Phos 38 - 126 U/L 58 - -  AST 15 - 41 U/L 20 - -  ALT 0 - 44 U/L 19 - -   CBC Latest Ref Rng & Units 07/29/2019 07/20/2019 07/06/2019  WBC 4.0 - 10.5 K/uL 16.7(H) 12.5(H) 14.9(H)  Hemoglobin 13.0 - 17.0 g/dL 9.3(L) 9.7(L) 10.1(L)  Hematocrit 39.0 - 52.0 % 29.4(L) 31.8(L) 31.9(L)  Platelets 150 - 400 K/uL 149(L) 182 184     RADS: CLINICAL DATA:  Abdominal pain, acute abdominal pain with difficulty urinating.  EXAM: CT ABDOMEN AND PELVIS WITH CONTRAST  TECHNIQUE: Multidetector CT imaging of the abdomen and pelvis was performed using the standard protocol following bolus administration of intravenous contrast.  CONTRAST:  71mL OMNIPAQUE IOHEXOL 300 MG/ML  SOLN  COMPARISON:  03/26/2019  FINDINGS: Lower chest: Chronic right-sided pleural thickening has been present since 2017.  No dense consolidation or pleural effusion. Signs of coronary  artery calcification. No pericardial fluid.  Hepatobiliary: Liver and biliary tree with normal appearance. Portal vein is patent.  Pancreas: Pancreas is normal without signs of ductal dilation or peripancreatic inflammation.  Spleen: Spleen is normal size without focal lesion.  Adrenals/Urinary Tract: Adrenal glands are normal.  Low-density lesion in the interpolar aspect of the left kidney shows water density measuring 2.2 x 2.3 cm, unchanged dating back to 2018. Renal cortical scarring bilaterally.  Low-density lesion in the right kidney also unchanged. This is in the lower pole. A smaller upper pole presumed cyst is also stable.  Nephrolithiasis with 3-4 mm calculus in the lower pole of the right kidney.  Tiny calculus in the distal left ureter is unchanged and is not associated with hydronephrosis or Peri ureteral stranding. No right ureteral calculi. Urinary bladder is unremarkable.  Stomach/Bowel: The gastrointestinal tract shows no acute findings. Changes of appendectomy in the right lower quadrant. Small hiatal hernia. Stool fills much of the colon.  Vascular/Lymphatic: Calcified and noncalcified plaque in the abdominal aorta no aneurysm. No adenopathy in the upper abdomen or retroperitoneum. No pelvic lymphadenopathy.  Reproductive: Prostate is normal by CT.  Other: No abdominal wall hernia or abnormality. No abdominopelvic ascites.  Musculoskeletal: Spinal degenerative changes, no acute or destructive bone finding.  IMPRESSION: 1. No acute abnormalities. 2. Stable nonobstructing calculus in the distal left ureter. 3. Nonobstructing calculus in the lower pole the right kidney is similar to the previous exam. 4. Bilateral renal cysts. 5. Coronary artery calcification. 6. Chronic right-sided pleural thickening, partially imaged may relate to prior trauma or infection.  Aortic Atherosclerosis (ICD10-I70.0).   Electronically Signed   By:  Zetta Bills M.D.   On: 07/29/2019 10:38  CLINICAL DATA:  Right upper quadrant pain  EXAM: ULTRASOUND ABDOMEN LIMITED RIGHT UPPER QUADRANT  COMPARISON:  October 28, 2015 ultrasound right upper quadrant  FINDINGS: Gallbladder:  No gallstones. Gallbladder wall is borderline thickened. There is no appreciable pericholecystic fluid. There is slight sludge in the gallbladder. No sonographic Murphy sign noted by sonographer.  Common bile duct:  Diameter: 4 mm. No intrahepatic or extrahepatic biliary duct dilatation.  Liver:  No focal lesion identified. Within normal limits in parenchymal echogenicity. Portal vein is patent on color Doppler imaging with normal direction of blood flow towards the liver.  Other: Right kidney appears somewhat echogenic.  IMPRESSION: 1. Slight sludge in gallbladder. Gallbladder wall borderline thickened, less pronounced than  on prior study. No gallstones are demonstrable. A degree of acalculus cholecystitis potentially could present in this manner. This finding may warrant correlation with nuclear medicine hepatobiliary imaging study to assess for cystic duct patency.  2. Echogenic right kidney, a finding that may be indicative of medical renal disease. Appropriate renal function laboratory studies advised.  3.  Study otherwise unremarkable.   Electronically Signed   By: Lowella Grip III M.D.   On: 07/29/2019 15:23  CLINICAL DATA:  Fever.  EXAM: PORTABLE CHEST 1 VIEW  COMPARISON:  June 07, 2019  FINDINGS: There are scattered pulmonary opacities bilaterally. There is no pneumothorax. There may be small bilateral pleural effusions. The heart size remains enlarged. The patient is status post prior median sternotomy.  IMPRESSION: Cardiomegaly with probable vascular congestion and pulmonary edema. An underlying atypical infectious process cannot be excluded.   Electronically Signed   By: Constance Holster M.D.   On: 07/29/2019 18:39   Assessment:      Sepsis of unknown origin.  Presenting with epigastric pain and subsequent nausea vomiting.  Ultrasound shows sludge but no other obvious concerning signs for acute cholecystitis.  Chest x-ray with diffuse bilateral opacities  Plan:     Presentation and previous history of full work-up including HIDA scan that was negative with similar symptomatology, long with a unimpressive physical exam despite his somnolent state makes gallbladder issues unlikely.  Due to the no other obvious source of infection, will proceed with HIDA scan at this time to further evaluate gallbladder function.  If HIDA returns normal patient will likely no surgical intervention.  Further evaluation of the possible scattered pulmonary opacities to be warranted at that time.  Even if the HIDA is positive, patient's comorbidities make him an extremely high perioperative risk, would likely recommend a cholecystostomy tube to temporarily relieve the infection.  Will discuss in detail with daughter once we have HIDA scan results.

## 2019-07-29 NOTE — Consult Note (Signed)
 Name: Andres Gutierrez MRN: [id number] DOB: 11/14/1930    ADMISSION DATE:  07/29/2019 CONSULTATION DATE:  07/29/2019  REFERRING MD :  Elizabeth Ouma, NP  CHIEF COMPLAINT:  Abdominal pain, Nausea, vomiting  BRIEF PATIENT DESCRIPTION:  86 y.o. Male with PMH notable for HFrEF (EF 20-25% in Jan 2021), HTN, CABG, and cystic fibrosis admitted 07/29/19 due to severe sepsis with septic shock of unknown etiology.  Suspected source is acalculous cholecystitis vs. Atypical Pneumonia. Possibility of Cardiogenic shock as well (EF 20-25% in Jan 2021).  SIGNIFICANT EVENTS  3/17: Presented to ED, to be admitted to Telemetry 3/17: Became hypotensive which was unresponsive to IVF resuscitation 3/17: Admit to ICU, PCCM consulted  STUDIES:  3/17: CT Abdomen & Pelvis>> 1. No acute abnormalities. 2. Stable nonobstructing calculus in the distal left ureter. 3. Nonobstructing calculus in the lower pole the right kidney is similar to the previous exam. 4. Bilateral renal cysts. 5. Coronary artery calcification. 6. Chronic right-sided pleural thickening, partially imaged may relate to prior trauma or infection. 3/17: RUQ Abdominal US>>1. Slight sludge in gallbladder. Gallbladder wall borderline thickened, less pronounced than on prior study. No gallstones are demonstrable. A degree of acalculus cholecystitis potentially could present in this manner. This finding may warrant correlation with nuclear medicine hepatobiliary imaging study to assess for cystic duct patency. 2. Echogenic right kidney, a finding that may be indicative of medical renal disease. Appropriate renal function laboratory studies advised. 3.  Study otherwise unremarkable. 3/17: CXR>> There are scattered pulmonary opacities bilaterally. There is no pneumothorax. There may be small bilateral pleural effusions. The heart size remains enlarged. The patient is status post prior median Sternotomy. 3/17: HIDA Scan>>Nonvisualization of the  gallbladder despite morphine administration, cystic duct patency cannot be established and acute cholecystitis therefore cannot be excluded. Note that false positive examinations can occur in the setting of prolonged fasting, chronic cholecystitis, and hepatocellular dysfunction.  CULTURES: SARS-CoV-2 PCR 3/17>> negative Influenza PCR 3/17>> negative MRSA PCR 3/17>> negative Blood culture x2 3/17>> Sputum 3/17>> Strep pneumo urinary antigen 3/17>> Legionella urinary antigen 3/17>>  ANTIBIOTICS: Cefepime x1 dose in ED 3/17 Flagyl x1 dose in ED 3/17>> Zosyn 3/17>> Vancomycin 3/18>>  HISTORY OF PRESENT ILLNESS:   Mr. Andres Gutierrez is a 86-year-old male with a past medical history notable for HFrEF (EF 20-25% in Jan 2021), CABG, chronic kidney disease stage III, anemia, anxiety, hypertension who presented to [facility] ED on 07/29/2019 due to abdominal pain.  Per his daughter's report, he again complaining of severe epigastric pain earlier this morning.  He denied chest pain, shortness of breath, diarrhea, dysuria.  He did have an episode of vomiting in the ED.     Upon presentation to the ED he was noted to be febrile with temperature 103.1, tachycardic, but normotensive.  Initial work-up revealed WBC 16.7, procalcitonin less than 0.1, lactic acid 1.6, hemoglobin 9.3, BUN 30, creatinine 1.95.  His COVID-19 PCR and influenza PCR both negative.  CT abdomen and pelvis was obtained which was negative for any acute intra-abdominal abnormality, did reveal a nonobstructing stone in the left distal ureter, and an nonobstructing stone in the lower pole of the right kidney.  Abdominal ultrasound showed sludge in the gallbladder, however no stones, concerning for possible acalculous cholecystitis.  HIDA scan was obtained which was unable to visualize cystic duct patency and unable to exclude acute cholecystitis.  Chest x-ray with scattered bilateral pulmonary opacities concerning for pulmonary edema versus atypical  infectious process.  He met sepsis criteria therefore he   received IV fluid resuscitation and received IV cefepime and Flagyl.  General surgery is following along.  Despite IV fluid resuscitation he remained hypotensive requiring Levophed infusion.  He is admitted to the ICU by the hospitalist.    PCCM is consulted for further work-up and treatment of severe sepsis with septic shock secondary to questionable acalculous cholecystitis versus questionable atypical pneumonia.  Questionable component of cardiogenic shock given his history of HFrEF as well.  PAST MEDICAL HISTORY :   has a past medical history of Anemia, Anxiety, Cardiomyopathy (HCC), CHF (congestive heart failure) (HCC), Chronic kidney disease, Coronary artery disease, Cough, Hypertension, Lymphadenopathy, hilar, MI (myocardial infarction) (HCC), and Wheezing.  has a past surgical history that includes Coronary artery bypass graft; Coronary angioplasty with stent; Endobronchial ultrasound (N/A, 12/31/2014); Bronchial needle aspiration biopsy (N/A, 12/31/2014); Esophagogastroduodenoscopy (egd) with propofol (N/A, 06/20/2015); Cardiac catheterization (N/A, 12/28/2015); and Cataract extraction w/PHACO (Left, 08/01/2016). Prior to Admission medications   Medication Sig Start Date End Date Taking? Authorizing Provider  aspirin EC 81 MG EC tablet Take 1 tablet (81 mg total) by mouth daily. 06/10/19  Yes Danford, Christopher P, MD  atorvastatin (LIPITOR) 40 MG tablet Take 1 tablet (40 mg total) by mouth daily at 6 PM. 12/30/15  Yes Patel, Shreyang, MD  digoxin (LANOXIN) 0.125 MG tablet Take 0.0625 mg by mouth daily.   Yes [provider]  famotidine (PEPCID) 20 MG tablet Take 20 mg by mouth at bedtime. 02/04/19  Yes [provider]  ferrous sulfate 325 (65 FE) MG tablet Take 1 tablet (325 mg total) by mouth every other day. Patient taking differently: Take 325 mg by mouth every Monday, Wednesday, and Friday.  03/04/19  Yes Allen, Lauren G,  NP  furosemide (LASIX) 40 MG tablet Take 1 tablet (40 mg total) by mouth daily. Patient taking differently: Take 20 mg by mouth daily.  06/10/19  Yes Danford, Christopher P, MD  metoprolol tartrate (LOPRESSOR) 25 MG tablet Take 0.5 tablets (12.5 mg total) by mouth 2 (two) times daily. 01/09/19  Yes Gouru, Aruna, MD  ranolazine (RANEXA) 1000 MG SR tablet Take 500 mg by mouth 2 (two) times daily.  01/16/19  Yes [provider]  sucralfate (CARAFATE) 1 G tablet Take 1 g by mouth 2 (two) times daily.    Yes [provider]  tamsulosin (FLOMAX) 0.4 MG CAPS capsule Take 0.4 mg by mouth daily after supper.    Yes [provider]  vitamin B-12 (CYANOCOBALAMIN) 500 MCG tablet Take 500 mcg by mouth daily.   Yes [provider]  vitamin C (VITAMIN C) 250 MG tablet Take 1 tablet (250 mg total) by mouth daily. 11/01/15  Yes Patel, Shreyang, MD  acetaminophen (TYLENOL) 500 MG tablet Take 500-1,000 mg by mouth every 6 (six) hours as needed for mild pain, moderate pain or fever.     [provider]  docusate sodium (COLACE) 100 MG capsule Take 100 mg by mouth daily as needed for mild constipation.     [provider]  nitroGLYCERIN (NITROSTAT) 0.4 MG SL tablet Place 0.4 mg under the tongue every 5 (five) minutes as needed for chest pain.    [provider]  omeprazole (PRILOSEC) 20 MG capsule Take 1 capsule (20 mg total) by mouth daily. 07/29/19 07/28/20  Jessup, Charles, MD  polyethylene glycol (MIRALAX / GLYCOLAX) 17 g packet Take 17 g by mouth daily as needed for mild constipation. 01/09/19   Gouru, Aruna, MD   Allergies  Allergen Reactions  .   Levaquin [Levofloxacin]     [name], her daughter, said Levaquin should be avoided because when patient had Levaquin a few years ago, he had "a bad reaction".  She said he could not walk and could not lift up his feet.  . 5ht3 Receptor Antagonists   . Diphenhydramine Hcl Other (See Comments)    Reaction:  Rash and  fever a long time ago, but has had it since with no problem.  . Escitalopram Other (See Comments)    Reaction:  Makes him feel faint, like he was going to have a heart attack.  . Maxidex [Dexamethasone] Other (See Comments)    Reaction:  Unknown   . Prednisone Other (See Comments)    Pt states that this medication made him feel crazy.    . Serotonin Other (See Comments)    Tried 2 different types, lexapro and another one.  Reaction:  Made him feel like he was having a heart attack.   . Vytorin [Ezetimibe-Simvastatin] Other (See Comments)    Reaction:  Unknown   . Zocor [Simvastatin] Other (See Comments)    Reaction:  Unknown   . Diltiazem Rash    FAMILY HISTORY:  family history includes CAD in his mother; Colon cancer in his father. SOCIAL HISTORY:  reports that he quit smoking about 42 years ago. His smokeless tobacco use includes chew. He reports current alcohol use. He reports that he does not use drugs.   COVID-19 DISASTER DECLARATION:  FULL CONTACT PHYSICAL EXAMINATION WAS NOT POSSIBLE DUE TO TREATMENT OF COVID-19 AND  CONSERVATION OF PERSONAL PROTECTIVE EQUIPMENT, LIMITED EXAM FINDINGS INCLUDE-  Patient assessed or the symptoms described in the history of present illness.  In the context of the Global COVID-19 pandemic, which necessitated consideration that the patient might be at risk for infection with the SARS-CoV-2 virus that causes COVID-19, Institutional protocols and algorithms that pertain to the evaluation of patients at risk for COVID-19 are in a state of rapid change based on information released by regulatory bodies including the CDC and federal and state organizations. These policies and algorithms were followed during the patient's care while in hospital.  REVIEW OF SYSTEMS:  Positives in BOLD Constitutional: Negative for fever, chills, weight loss, malaise/fatigue and diaphoresis.  HENT: Negative for hearing loss, ear pain, nosebleeds, congestion, sore throat,  neck pain, tinnitus and ear discharge.   Eyes: Negative for blurred vision, double vision, photophobia, pain, discharge and redness.  Respiratory: Negative for cough, hemoptysis, sputum production, shortness of breath, wheezing and stridor.   Cardiovascular: Negative for chest pain, palpitations, orthopnea, claudication, leg swelling and PND.  Gastrointestinal: Negative for heartburn, nausea, vomiting, +abdominal pain, diarrhea, constipation, blood in stool and melena.  Genitourinary: Negative for dysuria, urgency, frequency, hematuria and flank pain.  Musculoskeletal: Negative for myalgias, back pain, joint pain and falls.  Skin: Negative for itching and rash.  Neurological: Negative for dizziness, tingling, tremors, sensory change, speech change, focal weakness, seizures, loss of consciousness, weakness and headaches.  Endo/Heme/Allergies: Negative for environmental allergies and polydipsia. Does not bruise/bleed easily.  SUBJECTIVE:  Pt reports abdominal pain which has greatly improved Denies chest pain, SOB, N/V, diarrhea, dysuria, fever/chills On 6 mcg Levophed infusion  VITAL SIGNS: Temp:  [97.5 F (36.4 C)-103.1 F (39.5 C)] 98.7 F (37.1 C) (03/17 2000) Pulse Rate:  [58-100] 89 (03/17 2000) Resp:  [17-28] 24 (03/17 2000) BP: (76-151)/(37-69) 95/39 (03/17 2100) SpO2:  [90 %-100 %] 96 % (03/17 2000) Weight:  [88.5 kg] 88.5 kg (03/17 0802)  PHYSICAL EXAMINATION: General:  Acutely ill appearing male, sitting in bed, on nasal cannula, in NAD Neuro:  Awake, A&O x4, speech clear, follow commands, no focal deficits, pupils PERRL HEENT:  Atraumatic, normocephalic, neck supple, no JVD Cardiovascular: Regular rate and rhythm, S1-S2, no murmurs, rubs, gallops, 1+ pulses Lungs: Clear to auscultation bilaterally, even, nonlabored, normal effort Abdomen: Obese, soft, mildly tender to palpation, no distention, no guarding or rebound tenderness, bowel sounds positive x4 Musculoskeletal:  Appropriate bulk and tone given age, no deformities, no edema Skin: Warm and dry, no obvious rashes, lesions, or ulcerations  Recent Labs  Lab 07/29/19 0825  NA 140  K 4.2  CL 104  CO2 23  BUN 30*  CREATININE 1.95*  GLUCOSE 122*   Recent Labs  Lab 07/29/19 0825  HGB 9.3*  HCT 29.4*  WBC 16.7*  PLT 149*   CT Abdomen Pelvis W Contrast  Result Date: 07/29/2019 CLINICAL DATA:  Abdominal pain, acute abdominal pain with difficulty urinating. EXAM: CT ABDOMEN AND PELVIS WITH CONTRAST TECHNIQUE: Multidetector CT imaging of the abdomen and pelvis was performed using the standard protocol following bolus administration of intravenous contrast. CONTRAST:  55m OMNIPAQUE IOHEXOL 300 MG/ML  SOLN COMPARISON:  03/26/2019 FINDINGS: Lower chest: Chronic right-sided pleural thickening has been present since 2017. No dense consolidation or pleural effusion. Signs of coronary artery calcification. No pericardial fluid. Hepatobiliary: Liver and biliary tree with normal appearance. Portal vein is patent. Pancreas: Pancreas is normal without signs of ductal dilation or peripancreatic inflammation. Spleen: Spleen is normal size without focal lesion. Adrenals/Urinary Tract: Adrenal glands are normal. Low-density lesion in the interpolar aspect of the left kidney shows water density measuring 2.2 x 2.3 cm, unchanged dating back to 2018. Renal cortical scarring bilaterally. Low-density lesion in the right kidney also unchanged. This is in the lower pole. A smaller upper pole presumed cyst is also stable. Nephrolithiasis with 3-4 mm calculus in the lower pole of the right kidney. Tiny calculus in the distal left ureter is unchanged and is not associated with hydronephrosis or Peri ureteral stranding. No right ureteral calculi. Urinary bladder is unremarkable. Stomach/Bowel: The gastrointestinal tract shows no acute findings. Changes of appendectomy in the right lower quadrant. Small hiatal hernia. Stool fills much of  the colon. Vascular/Lymphatic: Calcified and noncalcified plaque in the abdominal aorta no aneurysm. No adenopathy in the upper abdomen or retroperitoneum. No pelvic lymphadenopathy. Reproductive: Prostate is normal by CT. Other: No abdominal wall hernia or abnormality. No abdominopelvic ascites. Musculoskeletal: Spinal degenerative changes, no acute or destructive bone finding. IMPRESSION: 1. No acute abnormalities. 2. Stable nonobstructing calculus in the distal left ureter. 3. Nonobstructing calculus in the lower pole the right kidney is similar to the previous exam. 4. Bilateral renal cysts. 5. Coronary artery calcification. 6. Chronic right-sided pleural thickening, partially imaged may relate to prior trauma or infection. Aortic Atherosclerosis (ICD10-I70.0). Electronically Signed   By: GZetta BillsM.D.   On: 07/29/2019 10:38   DG Chest Port 1 View  Result Date: 07/29/2019 CLINICAL DATA:  Fever. EXAM: PORTABLE CHEST 1 VIEW COMPARISON:  June 07, 2019 FINDINGS: There are scattered pulmonary opacities bilaterally. There is no pneumothorax. There may be small bilateral pleural effusions. The heart size remains enlarged. The patient is status post prior median sternotomy. IMPRESSION: Cardiomegaly with probable vascular congestion and pulmonary edema. An underlying atypical infectious process cannot be excluded. Electronically Signed   By: CConstance HolsterM.D.   On: 07/29/2019 18:39   [place]oreaAbdomen Limited RUQ  Result Date: 07/29/2019 CLINICAL DATA:  Right upper quadrant pain EXAM: ULTRASOUND ABDOMEN LIMITED RIGHT UPPER QUADRANT COMPARISON:  October 28, 2015 ultrasound right upper quadrant FINDINGS: Gallbladder: No gallstones. Gallbladder wall is borderline thickened. There is no appreciable pericholecystic fluid. There is slight sludge in the gallbladder. No sonographic Murphy sign noted by sonographer. Common bile duct: Diameter: 4 mm. No intrahepatic or extrahepatic biliary duct dilatation. Liver: No  focal lesion identified. Within normal limits in parenchymal echogenicity. Portal vein is patent on color Doppler imaging with normal direction of blood flow towards the liver. Other: Right kidney appears somewhat echogenic. IMPRESSION: 1. Slight sludge in gallbladder. Gallbladder wall borderline thickened, less pronounced than on prior study. No gallstones are demonstrable. A degree of acalculus cholecystitis potentially could present in this manner. This finding may warrant correlation with nuclear medicine hepatobiliary imaging study to assess for cystic duct patency. 2. Echogenic right kidney, a finding that may be indicative of medical renal disease. Appropriate renal function laboratory studies advised. 3.  Study otherwise unremarkable. Electronically Signed   By: Lowella Grip III M.D.   On: 07/29/2019 15:23    ASSESSMENT / PLAN:  Septic shock +/- Cardiogenic shock Acute on chronic HFrEF Mildly elevated troponin, likely demand ischemia -Continuous cardiac monitoring -Maintain MAP greater than 65 -Received IV fluid resuscitation in ED -Cautious IV Fluids given CHF history -Levophed as needed to maintain MAP goal -Trend lactic acid -Check serum cortisol -Lasix as renal function and BP permits -Check BNP -Trend troponin until downtrending -Check 2D echocardiogram  Severe sepsis secondary to questionable acalculous cholecystitis vs. questionable atypical pneumonia -Monitor fever curve -Trend WBCs and procalcitonin -Follow cultures as above -Received cefepime and Flagyl in the ED -Placed on Zosyn and Vancomycin for now -General surgery following, appreciate input -HIDA scan unable to establish patency of cystic duct, therefor acute cholecystitis unable to be excluded -Chest x-ray with scattered bilateral pulmonary opacities (edema versus atypical infectious process) -Urinalysis is negative for UTI **Pt's follow up WBC is 54.5, consider Hematology consult**  Chronic kidney  disease stage III (currently stable) -Monitor I&O's / urinary output -Follow BMP -Ensure adequate renal perfusion -Avoid nephrotoxic agents as able -Replace electrolytes as indicated -Cautious IV fluids given CHF  Anemia without signs or symptoms of bleeding -Monitor for S/Sx of bleeding -Trend CBC -SQ Heparin for VTE Prophylaxis  -Transfuse for Hgb <7       Pt's prognosis is guarded and is at high risk of cardiac arrest and death.  DISPOSITION: ICU GOALS OF CARE: Full Code VTE PROPHYLAXIS: SQ Heparin STRESS ULCER PROPHYLAXIS: Pepcid UPDATES: Updated pt at bedside 07/29/19 CONSULTS: Hospitalist, General Surgery  Darel Hong, [facility] Pager: [phone]  07/29/2019, 9:08 PM

## 2019-07-29 NOTE — ED Provider Notes (Signed)
[facility] Emergency Department Provider Note   ____________________________________________   First MD Initiated Contact with Patient 07/29/19 [phone]     (approximate)  I have reviewed the triage vital signs and the nursing notes.   HISTORY  Chief Complaint Abdominal Pain and Urinary Retention    HPI Andres Gutierrez is a 86 y.o. male with past medical history of CAD status post CABG, CHF, nephrolithiasis, and cystic lung disease who presents to the ED complaining of abdominal pain.  Patient reports that overnight he seemed to develop pain in the area of his epigastrium that has been constant and sharp, not exacerbated or alleviated by anything.  He also states it has been difficult for him to urinate recently with difficulty starting a stream and subsequent weak stream.  He denies any dysuria or hematuria, has not had any fevers or flank pain.  He denies any nausea or vomiting, but has been constipated recently.  He has not had any cough, chest pain, or shortness of breath.        Past Medical History:  Diagnosis Date  . Anemia   . Anxiety   . Cardiomyopathy ([place])   . CHF (congestive heart failure) ([place])   . Chronic kidney disease    kidney stones  . Coronary artery disease   . Cough   . Hypertension   . Lymphadenopathy, hilar   . MI (myocardial infarction) ([place])    x 2  . Wheezing     Patient Active Problem List   Diagnosis Date Noted  . Foot infection   . Acute on chronic heart failure ([place]) 06/07/2019  . Weakness   . NSTEMI (non-ST elevated myocardial infarction) ([place])   . Orthostatic hypotension 01/07/2019  . Macrocytic anemia 08/05/2018  . Anemia of chronic kidney failure, stage 3 (moderate) 08/05/2018  . BPH (benign prostatic hyperplasia) 07/24/2018  . Gastroesophageal reflux disease with esophagitis 07/24/2018  . Stage 3 chronic kidney disease 07/24/2018  . Thrombocytopenia ([place]) 07/24/2018  . Primary osteoarthritis of both knees  08/22/2017  . Chronic systolic heart failure ([place]) 01/17/2016  . Chewing tobacco use 01/17/2016  . Atrial fibrillation with RVR ([place]) 12/23/2015  . Dyspnea on exertion 12/23/2015  . Elevated troponin 12/23/2015  . GERD (gastroesophageal reflux disease) 12/23/2015  . Iron deficiency anemia due to chronic blood loss 12/20/2015  . Femoral neck fracture, left, closed, initial encounter 11/03/2015  . Chronic atrial fibrillation ([place])   . Coronary artery disease involving native coronary artery of native heart without angina pectoris   . Acalculous cholecystitis 10/29/2015  . Adenopathy   . CAD (coronary artery disease) 12/22/2014  . HTN (hypertension) 12/22/2014    Past Surgical History:  Procedure Laterality Date  . BRONCHIAL NEEDLE ASPIRATION BIOPSY N/A 12/31/2014   Procedure: BRONCHIAL NEEDLE ASPIRATION BIOPSIES from carina;  Surgeon: Flora Lipps, MD;  Location: [facility] ORS;  Service: Cardiopulmonary;  Laterality: N/A;  . CARDIAC CATHETERIZATION N/A 12/28/2015   Procedure: Left Heart Cath and Cors/Grafts Angiography;  Surgeon: Yolonda Kida, MD;  Location: [place] [facility];  Service: Cardiovascular;  Laterality: N/A;  . CATARACT EXTRACTION W/PHACO Left 08/01/2016   Procedure: CATARACT EXTRACTION PHACO AND INTRAOCULAR LENS PLACEMENT ([place]) Left;  Surgeon: Leandrew Koyanagi, MD;  Location: [place];  Service: Ophthalmology;  Laterality: Left;  . CORONARY ANGIOPLASTY WITH STENT PLACEMENT    . CORONARY ARTERY BYPASS GRAFT    . ENDOBRONCHIAL ULTRASOUND N/A 12/31/2014   Procedure: ENDOBRONCHIAL ULTRASOUND;  Surgeon: Flora Lipps, MD;  Location: [facility] ORS;  Service: Cardiopulmonary;  Laterality: N/A;  . ESOPHAGOGASTRODUODENOSCOPY (EGD) WITH PROPOFOL N/A 06/20/2015   Procedure: ESOPHAGOGASTRODUODENOSCOPY (EGD) WITH PROPOFOL;  Surgeon: Lollie Sails, MD;  Location: [facility] ENDOSCOPY;  Service: Endoscopy;  Laterality: N/A;    Prior to Admission medications   Medication Sig Start Date  End Date Taking? Authorizing Provider  aspirin EC 81 MG EC tablet Take 1 tablet (81 mg total) by mouth daily. 06/10/19  Yes Danford, Suann Larry, MD  atorvastatin (LIPITOR) 40 MG tablet Take 1 tablet (40 mg total) by mouth daily at 6 PM. 12/30/15  Yes Dustin Flock, MD  digoxin (LANOXIN) 0.125 MG tablet Take 0.0625 mg by mouth daily.   Yes [provider]  famotidine (PEPCID) 20 MG tablet Take 20 mg by mouth at bedtime. 02/04/19  Yes [provider]  ferrous sulfate 325 (65 FE) MG tablet Take 1 tablet (325 mg total) by mouth every other day. Patient taking differently: Take 325 mg by mouth every Monday, Wednesday, and Friday.  03/04/19  Yes Verlon Au, NP  furosemide (LASIX) 40 MG tablet Take 1 tablet (40 mg total) by mouth daily. Patient taking differently: Take 20 mg by mouth daily.  06/10/19  Yes Danford, Suann Larry, MD  metoprolol tartrate (LOPRESSOR) 25 MG tablet Take 0.5 tablets (12.5 mg total) by mouth 2 (two) times daily. 01/09/19  Yes Gouru, Illene Silver, MD  ranolazine (RANEXA) 1000 MG SR tablet Take 500 mg by mouth 2 (two) times daily.  01/16/19  Yes [provider]  sucralfate (CARAFATE) 1 G tablet Take 1 g by mouth 2 (two) times daily.    Yes [provider]  tamsulosin (FLOMAX) 0.4 MG CAPS capsule Take 0.4 mg by mouth daily after supper.    Yes [provider]  vitamin B-12 (CYANOCOBALAMIN) 500 MCG tablet Take 500 mcg by mouth daily.   Yes [provider]  vitamin C (VITAMIN C) 250 MG tablet Take 1 tablet (250 mg total) by mouth daily. 11/01/15  Yes Dustin Flock, MD  acetaminophen (TYLENOL) 500 MG tablet Take 500-1,000 mg by mouth every 6 (six) hours as needed for mild pain, moderate pain or fever.     [provider]  docusate sodium (COLACE) 100 MG capsule Take 100 mg by mouth daily as needed for mild constipation.     [provider]  nitroGLYCERIN (NITROSTAT) 0.4 MG SL tablet Place 0.4 mg under the tongue every  5 (five) minutes as needed for chest pain.    [provider]  omeprazole (PRILOSEC) 20 MG capsule Take 1 capsule (20 mg total) by mouth daily. 07/29/19 07/28/20  Blake Divine, MD  polyethylene glycol (MIRALAX / GLYCOLAX) 17 g packet Take 17 g by mouth daily as needed for mild constipation. 01/09/19   Nicholes Mango, MD    Allergies 5ht3 receptor antagonists, Diphenhydramine hcl, Escitalopram, Maxidex [dexamethasone], Prednisone, Serotonin, Vytorin [ezetimibe-simvastatin], Zocor [simvastatin], and Diltiazem  Family History  Problem Relation Age of Onset  . CAD Mother   . Colon cancer Father     Social History Social History   Tobacco Use  . Smoking status: Former Smoker    Quit date: 06/27/1977    Years since quitting: 42.1  . Smokeless tobacco: Current User    Types: Chew  Substance Use Topics  . Alcohol use: Yes    Comment: occational almost rare once a year  . Drug use: No    Review of Systems  Constitutional: No fever/chills Eyes: No visual changes.  ENT: No sore throat. Cardiovascular: Denies chest pain. Respiratory: Denies shortness of breath. Gastrointestinal: Positive for abdominal pain.  No nausea, no vomiting.  No diarrhea.  Positive for constipation. Genitourinary: Negative for dysuria.  Positive for urinary hesitancy and frequency. Musculoskeletal: Negative for back pain. Skin: Negative for rash. Neurological: Negative for headaches, focal weakness or numbness.  ____________________________________________   PHYSICAL EXAM:  VITAL SIGNS: ED Triage Vitals  Enc Vitals Group     BP 07/29/19 0804 (!) 146/64     Pulse Rate 07/29/19 0801 72     Resp 07/29/19 0801 17     Temp 07/29/19 0804 (!) 97.5 F (36.4 C)     Temp Source 07/29/19 0801 Oral     SpO2 07/29/19 0801 100 %     Weight 07/29/19 0802 195 lb (88.5 kg)     Height 07/29/19 0802 6\' 4"  (1.93 m)     Head Circumference --      Peak Flow --      Pain Score 07/29/19 0802 5     Pain Loc --        Pain Edu? --      Excl. in [place]? --     Constitutional: Alert and oriented. Eyes: Conjunctivae are normal. Head: Atraumatic. Nose: No congestion/rhinnorhea. Mouth/Throat: Mucous membranes are moist. Neck: Normal ROM Cardiovascular: Normal rate, regular rhythm. Grossly normal heart sounds. Respiratory: Normal respiratory effort.  No retractions. Lungs CTAB. Gastrointestinal: Soft and tender to palpation in the epigastrium with no rebound or guarding. No distention. Genitourinary: deferred Musculoskeletal: No lower extremity tenderness nor edema. Neurologic:  Normal speech and language. No gross focal neurologic deficits are appreciated. Skin:  Skin is warm, dry and intact. No rash noted. Psychiatric: Mood and affect are normal. Speech and behavior are normal.  ____________________________________________   LABS (all labs ordered are listed, but only abnormal results are displayed)  Labs Reviewed  CBC WITH DIFFERENTIAL/PLATELET - Abnormal; Notable for the following components:      Result Value   WBC 16.7 (*)    RBC 2.76 (*)    Hemoglobin 9.3 (*)    HCT 29.4 (*)    MCV 106.5 (*)    RDW 17.5 (*)    Platelets 149 (*)    Neutro Abs 10.1 (*)    Monocytes Absolute 3.8 (*)    Abs Immature Granulocytes 0.24 (*)    All other components within normal limits  COMPREHENSIVE METABOLIC PANEL - Abnormal; Notable for the following components:   Glucose, Bld 122 (*)    BUN 30 (*)    Creatinine, Ser 1.95 (*)    Total Bilirubin 1.3 (*)    GFR calc non Af Amer 30 (*)    GFR calc Af Amer 35 (*)    All other components within normal limits  URINALYSIS, COMPLETE (UACMP) WITH MICROSCOPIC - Abnormal; Notable for the following components:   Color, Urine AMBER (*)    APPearance HAZY (*)    Protein, ur 30 (*)    All other components within normal limits  TROPONIN I (HIGH SENSITIVITY) - Abnormal; Notable for the following components:   Troponin I (High Sensitivity) 28 (*)    All other  components within normal limits  TROPONIN I (HIGH SENSITIVITY) - Abnormal; Notable for the following components:   Troponin I (High Sensitivity) 25 (*)    All other components within normal limits  LIPASE, BLOOD  LACTIC ACID, PLASMA   ____________________________________________  EKG  ED ECG REPORT I, Blake Divine, the  attending physician, personally viewed and interpreted this ECG.   Date: 07/29/2019  EKG Time: 8:12  Rate: 71  Rhythm: normal sinus rhythm  Axis: Normal  Intervals:nonspecific intraventricular conduction delay  ST&T Change: None   PROCEDURES  Procedure(s) performed (including Critical Care):  Procedures   ____________________________________________   INITIAL IMPRESSION / ASSESSMENT AND PLAN / ED COURSE       86 year old male presents to the ED complaining of upper abdominal pain onset overnight along with some recent difficulty urinating and constipation.  He does have some tenderness in the area of his epigastrium and we will further assess with CMP, lipase, CBC, and CT scan.  Symptoms seem unlikely to be cardiac in origin, EKG without acute ischemic changes but we will screen troponin.  Plan to also check UA for evidence of infection, he complains of some difficulty urinating but does not appear to be retaining urine based off of bladder scan.  UA shows no evidence of infection.  Lab work shows patient's stable chronic anemia as well as a leukocytosis and thrombocytopenia.  Patient and daughter were notified of these findings and states that patient follows regularly with heme-onc.  I advised him to schedule a follow-up appointment given mildly worsened thrombocytopenia.  Cardiac work-up is unremarkable, 2 sets of troponin within normal limits.  CT scan is negative for acute process, does show nonobstructing nephrolithiasis which is unlikely to be the etiology of his pain.  He continues to have upper abdominal pain including some right upper quadrant  tenderness to palpation.  We will further assess with ultrasound to ensure no biliary pathology, but if this were to be negative I would suspect PUD versus GERD.  Patient already takes Pepcid and Carafate regularly, plan to add omeprazole and patient counseled to follow-up with GI for consideration of endoscopy.  Patient turned over to oncoming provider pending ultrasound results.      ____________________________________________   FINAL CLINICAL IMPRESSION(S) / ED DIAGNOSES  Final diagnoses:  RUQ pain  Epigastric pain  Non-intractable vomiting with nausea, unspecified vomiting type     ED Discharge Orders         Ordered    omeprazole (PRILOSEC) 20 MG capsule  Daily     07/29/19 1520           Note:  This document was prepared using Dragon voice recognition software and may include unintentional dictation errors.   Blake Divine, MD 07/29/19 [phone]

## 2019-07-29 NOTE — ED Notes (Signed)
Dr Carrie Mew gave verbal order to start Levophed at 4 mcg/min in nuclear imaging. Joni Fears, MD, states that continuous monitoring by this RN is not needed for levophed administration.

## 2019-07-29 NOTE — ED Triage Notes (Signed)
Pt c/o having difficulty urinating since yesterday states he is able to pass some urine but has to strain, pt also c/o medial upper abd pain since last night. Denies N/V/D.the patient is in NAD at present.

## 2019-07-29 NOTE — H&P (Addendum)
History and Physical:    MAAHIR HORST   FGH:[id number] DOB: 1930-10-16 DOA: 07/29/2019  Referring MD/provider: Dr. Brenton Grills PCP: Rusty Aus, MD   Patient coming from: Home  Chief Complaint: Abdominal pain  History of Present Illness:   Andres Gutierrez is an 86 y.o. male with medical history significant for chronic systolic CHF with EF of 20 to 25%, ED status post CABG, hypertension, cystic fibrosis lung disease, who presented to the hospital because of abdominal pain.  Patient is lethargic after receiving morphine, Haldol and Phenergan in the emergency room.  He is unable to provide any history so history was obtained from ED physician, chart review and his daughter, [name], at the bedside.  According to [name], patient complained of severe epigastric pain earlier this morning.  She said patient was fine yesterday.  Reportedly, patient vomited in the emergency room after he was given Percocet.  No report of diarrhea.  ED Course:  The patient febrile with temperature of 103.1, slightly tachycardic but he was not hypotensive. Lactate was 3.2 and patient had leukocytosis with WBC of 16.7.  CT abdomen and pelvis did not show any acute abnormality but ultrasound of the abdomen showed small sludge in the gallbladder and borderline thickening of gallbladder wall concerning for cholecystitis.  He was given IV Dilaudid, IV morphine, IV Haldol, Percocet, IV Protonix, IV cefepime and Flagyl in the emergency room.  He was also given 1.5 L of Ringer's lactate bolus.    ROS:   ROS unable to obtain because patient is lethargic  Past Medical History:   Past Medical History:  Diagnosis Date  . Anemia   . Anxiety   . Cardiomyopathy ([place])   . CHF (congestive heart failure) ([place])   . Chronic kidney disease    kidney stones  . Coronary artery disease   . Cough   . Hypertension   . Lymphadenopathy, hilar   . MI (myocardial infarction) ([place])    x 2  . Wheezing     Past Surgical History:    Past Surgical History:  Procedure Laterality Date  . BRONCHIAL NEEDLE ASPIRATION BIOPSY N/A 12/31/2014   Procedure: BRONCHIAL NEEDLE ASPIRATION BIOPSIES from carina;  Surgeon: Flora Lipps, MD;  Location: [facility] ORS;  Service: Cardiopulmonary;  Laterality: N/A;  . CARDIAC CATHETERIZATION N/A 12/28/2015   Procedure: Left Heart Cath and Cors/Grafts Angiography;  Surgeon: Yolonda Kida, MD;  Location: [facility];  Service: Cardiovascular;  Laterality: N/A;  . CATARACT EXTRACTION W/PHACO Left 08/01/2016   Procedure: CATARACT EXTRACTION PHACO AND INTRAOCULAR LENS PLACEMENT ([place]) Left;  Surgeon: Leandrew Koyanagi, MD;  Location: [place];  Service: Ophthalmology;  Laterality: Left;  . CORONARY ANGIOPLASTY WITH STENT PLACEMENT    . CORONARY ARTERY BYPASS GRAFT    . ENDOBRONCHIAL ULTRASOUND N/A 12/31/2014   Procedure: ENDOBRONCHIAL ULTRASOUND;  Surgeon: Flora Lipps, MD;  Location: [facility] ORS;  Service: Cardiopulmonary;  Laterality: N/A;  . ESOPHAGOGASTRODUODENOSCOPY (EGD) WITH PROPOFOL N/A 06/20/2015   Procedure: ESOPHAGOGASTRODUODENOSCOPY (EGD) WITH PROPOFOL;  Surgeon: Lollie Sails, MD;  Location: [facility] ENDOSCOPY;  Service: Endoscopy;  Laterality: N/A;    Social History:   Social History   Socioeconomic History  . Marital status: Widowed    Spouse name: Not on file  . Number of children: 2  . Years of education: college  . Highest education level: Some college, no degree  Occupational History  . Occupation: retired  Tobacco Use  . Smoking status: Former Smoker  Quit date: 06/27/1977    Years since quitting: 42.1  . Smokeless tobacco: Current User    Types: Chew  Substance and Sexual Activity  . Alcohol use: Yes    Comment: occational almost rare once a year  . Drug use: No  . Sexual activity: Never    Birth control/protection: Abstinence  Other Topics Concern  . Not on file  Social History Narrative  . Not on file   Social Determinants of Health    Financial Resource Strain:   . Difficulty of Paying Living Expenses:   Food Insecurity:   . Worried About Charity fundraiser in the Last Year:   . Arboriculturist in the Last Year:   Transportation Needs:   . Film/video editor (Medical):   Marland Kitchen Lack of Transportation (Non-Medical):   Physical Activity:   . Days of Exercise per Week:   . Minutes of Exercise per Session:   Stress:   . Feeling of Stress :   Social Connections:   . Frequency of Communication with Friends and Family:   . Frequency of Social Gatherings with Friends and Family:   . Attends Religious Services:   . Active Member of Clubs or Organizations:   . Attends Archivist Meetings:   Marland Kitchen Marital Status:   Intimate Partner Violence:   . Fear of Current or Ex-Partner:   . Emotionally Abused:   Marland Kitchen Physically Abused:   . Sexually Abused:     Allergies   5ht3 receptor antagonists, Diphenhydramine hcl, Escitalopram, Maxidex [dexamethasone], Prednisone, Serotonin, Vytorin [ezetimibe-simvastatin], Zocor [simvastatin], and Diltiazem  Family history:   Family History  Problem Relation Age of Onset  . CAD Mother   . Colon cancer Father     Current Medications:   Prior to Admission medications   Medication Sig Start Date End Date Taking? Authorizing Provider  aspirin EC 81 MG EC tablet Take 1 tablet (81 mg total) by mouth daily. 06/10/19  Yes Danford, Suann Larry, MD  atorvastatin (LIPITOR) 40 MG tablet Take 1 tablet (40 mg total) by mouth daily at 6 PM. 12/30/15  Yes Dustin Flock, MD  digoxin (LANOXIN) 0.125 MG tablet Take 0.0625 mg by mouth daily.   Yes [provider]  famotidine (PEPCID) 20 MG tablet Take 20 mg by mouth at bedtime. 02/04/19  Yes [provider]  ferrous sulfate 325 (65 FE) MG tablet Take 1 tablet (325 mg total) by mouth every other day. Patient taking differently: Take 325 mg by mouth every Monday, Wednesday, and Friday.  03/04/19  Yes Verlon Au, NP   furosemide (LASIX) 40 MG tablet Take 1 tablet (40 mg total) by mouth daily. Patient taking differently: Take 20 mg by mouth daily.  06/10/19  Yes Danford, Suann Larry, MD  metoprolol tartrate (LOPRESSOR) 25 MG tablet Take 0.5 tablets (12.5 mg total) by mouth 2 (two) times daily. 01/09/19  Yes Gouru, Illene Silver, MD  ranolazine (RANEXA) 1000 MG SR tablet Take 500 mg by mouth 2 (two) times daily.  01/16/19  Yes [provider]  sucralfate (CARAFATE) 1 G tablet Take 1 g by mouth 2 (two) times daily.    Yes [provider]  tamsulosin (FLOMAX) 0.4 MG CAPS capsule Take 0.4 mg by mouth daily after supper.    Yes [provider]  vitamin B-12 (CYANOCOBALAMIN) 500 MCG tablet Take 500 mcg by mouth daily.   Yes [provider]  vitamin C (VITAMIN C) 250 MG tablet Take 1 tablet (  250 mg total) by mouth daily. 11/01/15  Yes Dustin Flock, MD  acetaminophen (TYLENOL) 500 MG tablet Take 500-1,000 mg by mouth every 6 (six) hours as needed for mild pain, moderate pain or fever.     [provider]  docusate sodium (COLACE) 100 MG capsule Take 100 mg by mouth daily as needed for mild constipation.     [provider]  nitroGLYCERIN (NITROSTAT) 0.4 MG SL tablet Place 0.4 mg under the tongue every 5 (five) minutes as needed for chest pain.    [provider]  omeprazole (PRILOSEC) 20 MG capsule Take 1 capsule (20 mg total) by mouth daily. 07/29/19 07/28/20  Blake Divine, MD  polyethylene glycol (MIRALAX / GLYCOLAX) 17 g packet Take 17 g by mouth daily as needed for mild constipation. 01/09/19   Nicholes Mango, MD    Physical Exam:   Vitals:   07/29/19 1608 07/29/19 1618 07/29/19 1619 07/29/19 1700  BP:      Pulse: 98  99 94  Resp: (!) 25  (!) 28 (!) 23  Temp:  (S) (!) 103.1 F (39.5 C)    TempSrc:  Oral    SpO2: 90%  95% 95%  Weight:      Height:         Physical Exam: Blood pressure (!) 151/59, pulse 94, temperature (S) (!) 103.1 F (39.5 C),  temperature source Oral, resp. rate (!) 23, height 6\' 4"  (1.93 m), weight 88.5 kg, SpO2 95 %. Gen: No acute distress. Head: Normocephalic, atraumatic. Eyes: Unable to examine Mouth: Moist mucous membranes Neck: Supple, no thyromegaly, no lymphadenopathy, no jugular venous distention. Chest: Lungs are clear to auscultation with good air movement. No rales, rhonchi or wheezes.  CV: Heart sounds are regular with an S1, S2. No murmurs, rubs or gallops. Abdomen: Soft,, nondistended with normal active bowel sounds. No palpable masses. Extremities: Extremities are without clubbing, or cyanosis. No edema. Pedal pulses 2+.  Skin: Warm and dry.  No ulcers noted Neuro: Patient is lethargic and minimally responsive (likely from sedatives in the emergency room) Psych: Unable to assess   Data Review:    Labs: Basic Metabolic Panel: Recent Labs  Lab 07/29/19 0825  NA 140  K 4.2  CL 104  CO2 23  GLUCOSE 122*  BUN 30*  CREATININE 1.95*  CALCIUM 8.9   Liver Function Tests: Recent Labs  Lab 07/29/19 0825  AST 20  ALT 19  ALKPHOS 58  BILITOT 1.3*  PROT 7.4  ALBUMIN 4.0   Recent Labs  Lab 07/29/19 0825  LIPASE 29   No results for input(s): AMMONIA in the last 168 hours. CBC: Recent Labs  Lab 07/29/19 0825  WBC 16.7*  NEUTROABS 10.1*  HGB 9.3*  HCT 29.4*  MCV 106.5*  PLT 149*   Cardiac Enzymes: No results for input(s): CKTOTAL, CKMB, CKMBINDEX, TROPONINI in the last 168 hours.  BNP (last 3 results) No results for input(s): PROBNP in the last 8760 hours. CBG: No results for input(s): GLUCAP in the last 168 hours.  Urinalysis    Component Value Date/Time   COLORURINE AMBER (A) 07/29/2019 0825   APPEARANCEUR HAZY (A) 07/29/2019 0825   LABSPEC 1.021 07/29/2019 0825   PHURINE 5.0 07/29/2019 0825   GLUCOSEU NEGATIVE 07/29/2019 0825   HGBUR NEGATIVE 07/29/2019 0825   BILIRUBINUR NEGATIVE 07/29/2019 0825   KETONESUR NEGATIVE 07/29/2019 0825   PROTEINUR 30 (A)  07/29/2019 0825   UROBILINOGEN 0.2 09/11/2010 0647   NITRITE NEGATIVE 07/29/2019 0825  LEUKOCYTESUR NEGATIVE 07/29/2019 0825      Radiographic Studies: CT Abdomen Pelvis W Contrast  Result Date: 07/29/2019 CLINICAL DATA:  Abdominal pain, acute abdominal pain with difficulty urinating. EXAM: CT ABDOMEN AND PELVIS WITH CONTRAST TECHNIQUE: Multidetector CT imaging of the abdomen and pelvis was performed using the standard protocol following bolus administration of intravenous contrast. CONTRAST:  61mL OMNIPAQUE IOHEXOL 300 MG/ML  SOLN COMPARISON:  03/26/2019 FINDINGS: Lower chest: Chronic right-sided pleural thickening has been present since 2017. No dense consolidation or pleural effusion. Signs of coronary artery calcification. No pericardial fluid. Hepatobiliary: Liver and biliary tree with normal appearance. Portal vein is patent. Pancreas: Pancreas is normal without signs of ductal dilation or peripancreatic inflammation. Spleen: Spleen is normal size without focal lesion. Adrenals/Urinary Tract: Adrenal glands are normal. Low-density lesion in the interpolar aspect of the left kidney shows water density measuring 2.2 x 2.3 cm, unchanged dating back to 2018. Renal cortical scarring bilaterally. Low-density lesion in the right kidney also unchanged. This is in the lower pole. A smaller upper pole presumed cyst is also stable. Nephrolithiasis with 3-4 mm calculus in the lower pole of the right kidney. Tiny calculus in the distal left ureter is unchanged and is not associated with hydronephrosis or Peri ureteral stranding. No right ureteral calculi. Urinary bladder is unremarkable. Stomach/Bowel: The gastrointestinal tract shows no acute findings. Changes of appendectomy in the right lower quadrant. Small hiatal hernia. Stool fills much of the colon. Vascular/Lymphatic: Calcified and noncalcified plaque in the abdominal aorta no aneurysm. No adenopathy in the upper abdomen or retroperitoneum. No pelvic  lymphadenopathy. Reproductive: Prostate is normal by CT. Other: No abdominal wall hernia or abnormality. No abdominopelvic ascites. Musculoskeletal: Spinal degenerative changes, no acute or destructive bone finding. IMPRESSION: 1. No acute abnormalities. 2. Stable nonobstructing calculus in the distal left ureter. 3. Nonobstructing calculus in the lower pole the right kidney is similar to the previous exam. 4. Bilateral renal cysts. 5. Coronary artery calcification. 6. Chronic right-sided pleural thickening, partially imaged may relate to prior trauma or infection. Aortic Atherosclerosis (ICD10-I70.0). Electronically Signed   By: Zetta Bills M.D.   On: 07/29/2019 10:38   US Abdomen Limited RUQ  Result Date: 07/29/2019 CLINICAL DATA:  Right upper quadrant pain EXAM: ULTRASOUND ABDOMEN LIMITED RIGHT UPPER QUADRANT COMPARISON:  October 28, 2015 ultrasound right upper quadrant FINDINGS: Gallbladder: No gallstones. Gallbladder wall is borderline thickened. There is no appreciable pericholecystic fluid. There is slight sludge in the gallbladder. No sonographic Murphy sign noted by sonographer. Common bile duct: Diameter: 4 mm. No intrahepatic or extrahepatic biliary duct dilatation. Liver: No focal lesion identified. Within normal limits in parenchymal echogenicity. Portal vein is patent on color Doppler imaging with normal direction of blood flow towards the liver. Other: Right kidney appears somewhat echogenic. IMPRESSION: 1. Slight sludge in gallbladder. Gallbladder wall borderline thickened, less pronounced than on prior study. No gallstones are demonstrable. A degree of acalculus cholecystitis potentially could present in this manner. This finding may warrant correlation with nuclear medicine hepatobiliary imaging study to assess for cystic duct patency. 2. Echogenic right kidney, a finding that may be indicative of medical renal disease. Appropriate renal function laboratory studies advised. 3.  Study otherwise  unremarkable. Electronically Signed   By: Lowella Grip III M.D.   On: 07/29/2019 15:23    EKG: Independently reviewed.  Atrial fibrillation, IVCD   Assessment/Plan:   Active Problems:   Sepsis ([place])  Sepsis probably secondary to acute cholecystitis/abdominal pain/leukocytosis: Admit to telemetry.  Patient  has already been given 1.5 L of Ringer's lactate. We will be cautios with hydration because of underlying CHF.  Analgesics as needed for pain.  Continue empiric IV antibiotics.  Patient has been seen by general surgeon who recommended HIDA scan. Ordered CXR as part of sepsis work up. Keep NPO for now.  Nausea and vomiting: Antiemetics as needed  Chronic systolic CHF (EF estimated at 20 to 25%): Compensated.  CAD s/p CABG: Continue aspirin  CKD stage IIIb: Creatinine is stable.   Body mass index is 23.74 kg/m.  Other information:   DVT prophylaxis: Heparin Code Status: Full code. Family Communication: Plan discussed with his daughter, [name] at the bedside Disposition Plan: Patient is from home and plan to discharge him home when cleared by general surgeon Consults called: General surgeon, Dr. Lysle Pearl Admission status: Inpatient  The medical decision making on this patient was of high complexity and the patient is at high risk for clinical deterioration, therefore this is a level 3 visit.   Time spent 70 minutes  [facility]   How to contact the [facility] Attending or Consulting provider [place] or covering provider during after hours [place], for this patient?   1. Check the care team in [facility] and look for a) attending/consulting [facility] provider listed and b) the [facility] team listed 2. Log into [email or web address] and use [facility] universal password to access. If you do not have the password, please contact the hospital operator. 3. Locate the [facility] provider you are looking for under [facility] and page to a number that you can be directly reached. 4. If  you still have difficulty reaching the provider, please page the [facility] (Director on Call) for the Hospitalists listed on amion for assistance.  07/29/2019, 6:21 PM

## 2019-07-29 NOTE — Progress Notes (Signed)
Subjective:  CC: Andres Gutierrez is a 86 y.o. male  Hospital stay day 1,   leukocytosis, abd pain  HPI: More aware today.  Complains of epigastric pain again.  ROS:  General: Denies weight loss, weight gain, fatigue, fevers, chills, and night sweats. Heart: Denies chest pain, palpitations, racing heart, irregular heartbeat, leg pain or swelling, and decreased activity tolerance. Respiratory: Denies breathing difficulty, shortness of breath, wheezing, cough, and sputum. GI: Denies change in appetite, heartburn, nausea, vomiting, constipation, diarrhea, and blood in stool. GU: Denies difficulty urinating, pain with urinating, urgency, frequency, blood in urine.   Objective:   Temp:  [98.1 F (36.7 C)-103.1 F (39.5 C)] 98.7 F (37.1 C) (03/18 0816) Pulse Rate:  [58-100] 77 (03/18 0800) Resp:  [19-30] 23 (03/18 0800) BP: (76-151)/(37-69) 116/49 (03/18 0800) SpO2:  [90 %-100 %] 98 % (03/18 0800) Weight:  [92 kg-93.6 kg] 92 kg (03/18 0341)     Height: 6\' 4"  (193 cm) Weight: 92 kg BMI (Calculated): 24.7   Intake/Output this shift:   Intake/Output Summary (Last 24 hours) at 07/30/2019 1019 Last data filed at 07/30/2019 0800 Gross per 24 hour  Intake 1351.92 ml  Output 650 ml  Net 701.92 ml    Constitutional :  alert, cooperative, appears stated age and no distress  Respiratory:  clear to auscultation bilaterally  Cardiovascular:  regular rate and rhythm  Gastrointestinal: Soft, no guarding, tenderness to palpation epigastric area.   Skin: Cool and moist.   Psychiatric: Normal affect, non-agitated, not confused       LABS:  CMP Latest Ref Rng & Units 07/30/2019 07/29/2019 07/29/2019  Glucose 70 - 99 mg/dL 111(H) 108(H) 122(H)  BUN 8 - 23 mg/dL 28(H) 27(H) 30(H)  Creatinine 0.61 - 1.24 mg/dL 1.91(H) 1.95(H) 1.95(H)  Sodium 135 - 145 mmol/L 138 138 140  Potassium 3.5 - 5.1 mmol/L 4.3 3.9 4.2  Chloride 98 - 111 mmol/L 105 106 104  CO2 22 - 32 mmol/L 24 24 23   Calcium 8.9 - 10.3  mg/dL 8.3(L) 8.5(L) 8.9  Total Protein 6.5 - 8.1 g/dL - 6.8 7.4  Total Bilirubin 0.3 - 1.2 mg/dL - 1.9(H) 1.3(H)  Alkaline Phos 38 - 126 U/L - 55 58  AST 15 - 41 U/L - 36 20  ALT 0 - 44 U/L - 18 19   CBC Latest Ref Rng & Units 07/30/2019 07/29/2019 07/29/2019  WBC 4.0 - 10.5 K/uL 59.8(HH) 54.5(HH) 16.7(H)  Hemoglobin 13.0 - 17.0 g/dL 8.6(L) 8.8(L) 9.3(L)  Hematocrit 39.0 - 52.0 % 27.0(L) 28.1(L) 29.4(L)  Platelets 150 - 400 K/uL 143(L) 170 149(L)    RADS: n/a Assessment:   Increasing leukocytosis, persistent epigastric pain, with hypotensive episodes requiring pressor support.  Currently on 5 mcg of Levophed.  Persistent white count and abdominal pain along with HIDA scan is concerning for possible cholecystitis.  Recommend IR guided percutaneous drainage since patient's comorbidities places him at a seriously high risk of perioperative complications if he proceed with laparoscopic cholecystectomy.  Of note the elevation in the white count is very extreme for typical cholecystitis, case discussed with critical care provider and hematology consult is pending to look into possible other causes of this extreme elevation.  Surgery will continue to follow for now.

## 2019-07-29 NOTE — Progress Notes (Signed)
CODE SEPSIS - PHARMACY COMMUNICATION  **Broad Spectrum Antibiotics should be administered within 1 hour of Sepsis diagnosis**  Time Code Sepsis Called/Page Received: 1734  Antibiotics Ordered: cefepime/Flagyl  Time of 1st antibiotic administration: 1701  Additional action taken by pharmacy: n/a  If necessary, Name of Provider/Nurse Contacted: n/a    Rocky Morel ,PharmD Clinical Pharmacist  07/29/2019  5:42 PM

## 2019-07-29 NOTE — Consult Note (Addendum)
Pharmacy Antibiotic Note  Andres Gutierrez is a 86 y.o. male admitted on 07/29/2019 with intra-abdominal infection.  Pharmacy has been consulted for Zoysn dosing. Patient received 1 dose of Cefepime and Flagyl in ED.  Plan: Zosyn 3.375g IV q8h (4 hour infusion).  Height: 6\' 4"  (193 cm) Weight: 195 lb (88.5 kg) IBW/kg (Calculated) : 86.8  Temp (24hrs), Avg:99.8 F (37.7 C), Min:97.5 F (36.4 C), Max:103.1 F (39.5 C)  Recent Labs  Lab 07/29/19 0825 07/29/19 1330 07/29/19 1702 07/29/19 1910  WBC 16.7*  --   --   --   CREATININE 1.95*  --   --   --   LATICACIDVEN  --  1.6 3.2* 3.0*    Estimated Creatinine Clearance: 33.4 mL/min (A) (by C-G formula based on SCr of 1.95 mg/dL (H)).    Allergies  Allergen Reactions  . Levaquin [Levofloxacin]     [name], her daughter, said Levaquin should be avoided because when patient had Levaquin a few years ago, he had "a bad reaction".  She said he could not walk and could not lift up his feet.  . 5ht3 Receptor Antagonists   . Diphenhydramine Hcl Other (See Comments)    Reaction:  Rash and fever a long time ago, but has had it since with no problem.  . Escitalopram Other (See Comments)    Reaction:  Makes him feel faint, like he was going to have a heart attack.  . Maxidex [Dexamethasone] Other (See Comments)    Reaction:  Unknown   . Prednisone Other (See Comments)    Pt states that this medication made him feel crazy.    . Serotonin Other (See Comments)    Tried 2 different types, lexapro and another one.  Reaction:  Made him feel like he was having a heart attack.   . Vytorin [Ezetimibe-Simvastatin] Other (See Comments)    Reaction:  Unknown   . Zocor [Simvastatin] Other (See Comments)    Reaction:  Unknown   . Diltiazem Rash    Antimicrobials this admission: Zosyn 3/17 >> Cefepime/Flagyl x1   Microbiology results: 3/17 BCx: pending   Thank you for allowing pharmacy to be a part of this patient's care.  Pearla Dubonnet 07/29/2019 9:26 PM

## 2019-07-29 NOTE — ED Notes (Signed)
Pt is being transported to nuclear medicine now. Rufina Falco, NP, is aware of pt BP and states that it is okay for pt to be transported.

## 2019-07-29 NOTE — Progress Notes (Signed)
PHARMACY -  BRIEF ANTIBIOTIC NOTE   Pharmacy has received consult(s) for cefepime from an ED provider.  The patient's profile has been reviewed for ht/wt/allergies/indication/available labs.    One time order(s) placed for cefepime 2 g IV x1 by EDP  Further antibiotics/pharmacy consults should be ordered by admitting physician if indicated.                       Thank you, Rocky Morel 07/29/2019  4:29 PM

## 2019-07-29 NOTE — ED Notes (Signed)
This RN made Rufina Falco, NP, aware of BP last two BP of 88/44 and 78/34.

## 2019-07-29 NOTE — ED Notes (Signed)
Pt is resting in stretcher and is easily arousable. Pt is alert, oriented, and able to follow commands. Respirations are equal and unlabored, no signs of acute distress.

## 2019-07-29 NOTE — Progress Notes (Signed)
[facility] Progress Note Patient Name: Andres Gutierrez DOB: [birth year] MRN: [id number]   Date of Service  07/29/2019  HPI/Events of Note  Pt admitted to [facility]  ICU with shock of unclear etiology. An infectious etiology is under consideration although CT abdomen and pelvis did not clearly identify a focus of infection. Pt also has a history of severe cardiomyopathy, systolic LV dysfunction, and heart failure with reduced ejection fraction.  eICU Interventions  New Patient Evaluation completed.        [name] [name] 07/29/2019, 11:13 PM

## 2019-07-29 NOTE — ED Notes (Signed)
Rufina Falco, NP, made aware of pt temperature of 98.7, and BP of 86/40 after 1L of LR.

## 2019-07-30 ENCOUNTER — Inpatient Hospital Stay
Admit: 2019-07-30 | Discharge: 2019-07-30 | Disposition: A | Discharge status: Home / Self Care | Payer: PPO | Attending: Pulmonary Disease | Admitting: Pulmonary Disease

## 2019-07-30 ENCOUNTER — Other Ambulatory Visit: Payer: Self-pay

## 2019-07-30 ENCOUNTER — Inpatient Hospital Stay: Payer: PPO

## 2019-07-30 DIAGNOSIS — K810 Acute cholecystitis: Secondary | ICD-10-CM

## 2019-07-30 DIAGNOSIS — D696 Thrombocytopenia, unspecified: Secondary | ICD-10-CM

## 2019-07-30 DIAGNOSIS — D701 Agranulocytosis secondary to cancer chemotherapy: Secondary | ICD-10-CM

## 2019-07-30 DIAGNOSIS — E440 Moderate protein-calorie malnutrition: Secondary | ICD-10-CM | POA: Insufficient documentation

## 2019-07-30 DIAGNOSIS — N189 Chronic kidney disease, unspecified: Secondary | ICD-10-CM

## 2019-07-30 DIAGNOSIS — I959 Hypotension, unspecified: Secondary | ICD-10-CM

## 2019-07-30 DIAGNOSIS — D631 Anemia in chronic kidney disease: Secondary | ICD-10-CM

## 2019-07-30 DIAGNOSIS — A4189 Other specified sepsis: Secondary | ICD-10-CM

## 2019-07-30 DIAGNOSIS — I499 Cardiac arrhythmia, unspecified: Secondary | ICD-10-CM

## 2019-07-30 DIAGNOSIS — D72828 Other elevated white blood cell count: Secondary | ICD-10-CM

## 2019-07-30 DIAGNOSIS — I509 Heart failure, unspecified: Secondary | ICD-10-CM

## 2019-07-30 DIAGNOSIS — I129 Hypertensive chronic kidney disease with stage 1 through stage 4 chronic kidney disease, or unspecified chronic kidney disease: Secondary | ICD-10-CM

## 2019-07-30 LAB — CBC
HCT: 28.1 % — ABNORMAL LOW (ref 39.0–52.0)
HCT: 27 % — ABNORMAL LOW (ref 39.0–52.0)
HCT: 27.3 % — ABNORMAL LOW (ref 39.0–52.0)
Hemoglobin: 8.8 g/dL — ABNORMAL LOW (ref 13.0–17.0)
Hemoglobin: 8.6 g/dL — ABNORMAL LOW (ref 13.0–17.0)
Hemoglobin: 8.6 g/dL — ABNORMAL LOW (ref 13.0–17.0)
MCH: 33.8 pg (ref 26.0–34.0)
MCH: 34.1 pg — ABNORMAL HIGH (ref 26.0–34.0)
MCH: 33.6 pg (ref 26.0–34.0)
MCHC: 31.3 g/dL (ref 30.0–36.0)
MCHC: 31.9 g/dL (ref 30.0–36.0)
MCHC: 31.5 g/dL (ref 30.0–36.0)
MCV: 108.1 fL — ABNORMAL HIGH (ref 80.0–100.0)
MCV: 107.1 fL — ABNORMAL HIGH (ref 80.0–100.0)
MCV: 106.6 fL — ABNORMAL HIGH (ref 80.0–100.0)
Platelets: 170 10*3/uL (ref 150–400)
Platelets: 143 10*3/uL — ABNORMAL LOW (ref 150–400)
Platelets: 122 10*3/uL — ABNORMAL LOW (ref 150–400)
RBC: 2.6 MIL/uL — ABNORMAL LOW (ref 4.22–5.81)
RBC: 2.52 MIL/uL — ABNORMAL LOW (ref 4.22–5.81)
RBC: 2.56 MIL/uL — ABNORMAL LOW (ref 4.22–5.81)
RDW: 17.7 % — ABNORMAL HIGH (ref 11.5–15.5)
RDW: 17.9 % — ABNORMAL HIGH (ref 11.5–15.5)
RDW: 17.8 % — ABNORMAL HIGH (ref 11.5–15.5)
WBC: 54.5 10*3/uL (ref 4.0–10.5)
WBC: 59.8 10*3/uL (ref 4.0–10.5)
WBC: 46.6 10*3/uL — ABNORMAL HIGH (ref 4.0–10.5)
nRBC: 0.1 % (ref 0.0–0.2)
nRBC: 0.1 % (ref 0.0–0.2)
nRBC: 0 % (ref 0.0–0.2)

## 2019-07-30 LAB — APTT: aPTT: 55 seconds — ABNORMAL HIGH (ref 24–36)

## 2019-07-30 LAB — ECHOCARDIOGRAM COMPLETE
Height: 76 in
Weight: 3245.17 oz

## 2019-07-30 LAB — DIFFERENTIAL
Abs Immature Granulocytes: 0.95 10*3/uL — ABNORMAL HIGH (ref 0.00–0.07)
Basophils Absolute: 0.1 10*3/uL (ref 0.0–0.1)
Basophils Relative: 0 %
Eosinophils Absolute: 0 10*3/uL (ref 0.0–0.5)
Eosinophils Relative: 0 %
Immature Granulocytes: 2 %
Lymphocytes Relative: 8 %
Lymphs Abs: 3.7 10*3/uL (ref 0.7–4.0)
Monocytes Absolute: 12 10*3/uL — ABNORMAL HIGH (ref 0.1–1.0)
Monocytes Relative: 26 %
Neutro Abs: 29.8 10*3/uL — ABNORMAL HIGH (ref 1.7–7.7)
Neutrophils Relative %: 64 %
Smear Review: NORMAL

## 2019-07-30 LAB — PATHOLOGIST SMEAR REVIEW

## 2019-07-30 LAB — BASIC METABOLIC PANEL
Anion gap: 9 (ref 5–15)
BUN: 28 mg/dL — ABNORMAL HIGH (ref 8–23)
CO2: 24 mmol/L (ref 22–32)
Calcium: 8.3 mg/dL — ABNORMAL LOW (ref 8.9–10.3)
Chloride: 105 mmol/L (ref 98–111)
Creatinine, Ser: 1.91 mg/dL — ABNORMAL HIGH (ref 0.61–1.24)
GFR calc Af Amer: 36 mL/min — ABNORMAL LOW (ref >60)
GFR calc non Af Amer: 31 mL/min — ABNORMAL LOW (ref >60)
Glucose, Bld: 111 mg/dL — ABNORMAL HIGH (ref 70–99)
Potassium: 4.3 mmol/L (ref 3.5–5.1)
Sodium: 138 mmol/L (ref 135–145)

## 2019-07-30 LAB — LACTIC ACID, PLASMA: Lactic Acid, Venous: 1.8 mmol/L (ref 0.5–1.9)

## 2019-07-30 LAB — BRAIN NATRIURETIC PEPTIDE: B Natriuretic Peptide: 1067 pg/mL — ABNORMAL HIGH (ref 0.0–100.0)

## 2019-07-30 LAB — PROTIME-INR
INR: 1.4 — ABNORMAL HIGH (ref 0.8–1.2)
Prothrombin Time: 17 seconds — ABNORMAL HIGH (ref 11.4–15.2)

## 2019-07-30 LAB — TECHNOLOGIST SMEAR REVIEW: RBC Morphology: NONE SEEN

## 2019-07-30 LAB — CORTISOL: Cortisol, Plasma: 14.8 ug/dL

## 2019-07-30 LAB — STREP PNEUMONIAE URINARY ANTIGEN: Strep Pneumo Urinary Antigen: NEGATIVE

## 2019-07-30 LAB — C-REACTIVE PROTEIN: CRP: 14.8 mg/dL — ABNORMAL HIGH (ref <1.0)

## 2019-07-30 LAB — PROCALCITONIN: Procalcitonin: 7.54 ng/mL

## 2019-07-30 LAB — MRSA PCR SCREENING: MRSA by PCR: NEGATIVE

## 2019-07-30 MED ORDER — METOPROLOL TARTRATE 5 MG/5ML IV SOLN
5.0000 mg | Freq: Once | INTRAVENOUS | Status: AC
  Administered 2019-07-30: 5 mg via INTRAVENOUS
  Filled 2019-07-30: qty 5

## 2019-07-30 MED ORDER — HEPARIN SODIUM (PORCINE) 5000 UNIT/ML IJ SOLN
5000.0000 [IU] | Freq: Three times a day (TID) | INTRAMUSCULAR | Status: DC
  Administered 2019-07-30 – 2019-08-02 (×8): 5000 [IU] via SUBCUTANEOUS
  Filled 2019-07-30 (×8): qty 1

## 2019-07-30 MED ORDER — HYDROCODONE-ACETAMINOPHEN 5-325 MG PO TABS
1.0000 | ORAL_TABLET | Freq: Four times a day (QID) | ORAL | Status: DC | PRN
  Administered 2019-07-30 – 2019-07-31 (×2): 1 via ORAL
  Filled 2019-07-30 (×2): qty 1

## 2019-07-30 MED ORDER — FENTANYL CITRATE (PF) 100 MCG/2ML IJ SOLN
INTRAMUSCULAR | Status: AC
  Filled 2019-07-30: qty 4

## 2019-07-30 MED ORDER — MIDAZOLAM HCL 2 MG/2ML IJ SOLN
INTRAMUSCULAR | Status: AC
  Filled 2019-07-30: qty 4

## 2019-07-30 MED ORDER — ORAL CARE MOUTH RINSE
15.0000 mL | Freq: Two times a day (BID) | OROMUCOSAL | Status: DC
  Administered 2019-07-30 – 2019-08-02 (×4): 15 mL via OROMUCOSAL

## 2019-07-30 MED ORDER — VANCOMYCIN HCL 2000 MG/400ML IV SOLN
2000.0000 mg | Freq: Once | INTRAVENOUS | Status: AC
  Administered 2019-07-30: 2000 mg via INTRAVENOUS
  Filled 2019-07-30: qty 400

## 2019-07-30 MED ORDER — FENTANYL CITRATE (PF) 100 MCG/2ML IJ SOLN
INTRAMUSCULAR | Status: AC | PRN
  Administered 2019-07-30: 25 ug via INTRAVENOUS
  Administered 2019-07-30: 50 ug via INTRAVENOUS

## 2019-07-30 MED ORDER — VANCOMYCIN VARIABLE DOSE PER UNSTABLE RENAL FUNCTION (PHARMACIST DOSING): Status: DC

## 2019-07-30 MED ORDER — MIDAZOLAM HCL 2 MG/2ML IJ SOLN
INTRAMUSCULAR | Status: AC | PRN
  Administered 2019-07-30: 1 mg via INTRAVENOUS

## 2019-07-30 NOTE — Consult Note (Signed)
Name: Andres Gutierrez MRN: [id number] DOB: Nov 29, 1930    ADMISSION DATE:  07/29/2019 CONSULTATION DATE:  07/29/2019  REFERRING MD :  Rufina Falco, NP  CHIEF COMPLAINT:  Abdominal pain, Nausea, vomiting  BRIEF PATIENT DESCRIPTION:  86 y.o. Male with PMH notable for HFrEF (EF 20-25% in Jan 2021), HTN, CABG, and cystic fibrosis admitted 07/29/19 due to severe sepsis with septic shock of unknown etiology.  Suspected source is acalculous cholecystitis vs. Atypical Pneumonia. Possibility of Cardiogenic shock as well (EF 20-25% in Jan 2021).  SIGNIFICANT EVENTS  3/17: Presented to ED, to be admitted to Telemetry 3/17: Became hypotensive which was unresponsive to IVF resuscitation 3/17: Admit to ICU, PCCM consulted 3/18 - met with daughter to discuss care plan, patient clinically imrpoved with resolution of abd pain.  Plan for per chole tube.  Oncology eval today.   STUDIES:  3/17: CT Abdomen & Pelvis>> 1. No acute abnormalities. 2. Stable nonobstructing calculus in the distal left ureter. 3. Nonobstructing calculus in the lower pole the right kidney is similar to the previous exam. 4. Bilateral renal cysts. 5. Coronary artery calcification. 6. Chronic right-sided pleural thickening, partially imaged may relate to prior trauma or infection. 3/17: RUQ Abdominal US>>1. Slight sludge in gallbladder. Gallbladder wall borderline thickened, less pronounced than on prior study. No gallstones are demonstrable. A degree of acalculus cholecystitis potentially could present in this manner. This finding may warrant correlation with nuclear medicine hepatobiliary imaging study to assess for cystic duct patency. 2. Echogenic right kidney, a finding that may be indicative of medical renal disease. Appropriate renal function laboratory studies advised. 3.  Study otherwise unremarkable. 3/17: CXR>> There are scattered pulmonary opacities bilaterally. There is no pneumothorax. There may be small  bilateral pleural effusions. The heart size remains enlarged. The patient is status post prior median Sternotomy. 3/17: HIDA Scan>>Nonvisualization of the gallbladder despite morphine administration, cystic duct patency cannot be established and acute cholecystitis therefore cannot be excluded. Note that false positive examinations can occur in the setting of prolonged fasting, chronic cholecystitis, and hepatocellular dysfunction.  CULTURES: SARS-CoV-2 PCR 3/17>> negative Influenza PCR 3/17>> negative MRSA PCR 3/17>> negative Blood culture x2 3/17>> Sputum 3/17>> Strep pneumo urinary antigen 3/17>> Legionella urinary antigen 3/17>>  ANTIBIOTICS: Cefepime x1 dose in ED 3/17 Flagyl x1 dose in ED 3/17>> Zosyn 3/17>> Vancomycin 3/18>>  HISTORY OF PRESENT ILLNESS:   Mr. Los is a 86 year old male with a past medical history notable for HFrEF (EF 20-25% in Jan 2021), CABG, chronic kidney disease stage III, anemia, anxiety, hypertension who presented to [facility] ED on 07/29/2019 due to abdominal pain.  Per his daughter's report, he again complaining of severe epigastric pain earlier this morning.  He denied chest pain, shortness of breath, diarrhea, dysuria.  He did have an episode of vomiting in the ED.     Upon presentation to the ED he was noted to be febrile with temperature 103.1, tachycardic, but normotensive.  Initial work-up revealed WBC 16.7, procalcitonin less than 0.1, lactic acid 1.6, hemoglobin 9.3, BUN 30, creatinine 1.95.  His COVID-19 PCR and influenza PCR both negative.  CT abdomen and pelvis was obtained which was negative for any acute intra-abdominal abnormality, did reveal a nonobstructing stone in the left distal ureter, and an nonobstructing stone in the lower pole of the right kidney.  Abdominal ultrasound showed sludge in the gallbladder, however no stones, concerning for possible acalculous cholecystitis.  HIDA scan was obtained which was unable to visualize cystic duct  patency and  unable to exclude acute cholecystitis.  Chest x-ray with scattered bilateral pulmonary opacities concerning for pulmonary edema versus atypical infectious process.  He met sepsis criteria therefore he received IV fluid resuscitation and received IV cefepime and Flagyl.  General surgery is following along.  Despite IV fluid resuscitation he remained hypotensive requiring Levophed infusion.  He is admitted to the ICU by the hospitalist.    PCCM is consulted for further work-up and treatment of severe sepsis with septic shock secondary to questionable acalculous cholecystitis versus questionable atypical pneumonia.  Questionable component of cardiogenic shock given his history of HFrEF as well.  PAST MEDICAL HISTORY :   has a past medical history of Anemia, Anxiety, Cardiomyopathy ([place]), CHF (congestive heart failure) ([place]), Chronic kidney disease, Coronary artery disease, Cough, Hypertension, Lymphadenopathy, hilar, MI (myocardial infarction) ([place]), and Wheezing.  has a past surgical history that includes Coronary artery bypass graft; Coronary angioplasty with stent; Endobronchial ultrasound (N/A, 12/31/2014); Bronchial needle aspiration biopsy (N/A, 12/31/2014); Esophagogastroduodenoscopy (egd) with propofol (N/A, 06/20/2015); Cardiac catheterization (N/A, 12/28/2015); and Cataract extraction w/PHACO (Left, 08/01/2016). Prior to Admission medications   Medication Sig Start Date End Date Taking? Authorizing Provider  aspirin EC 81 MG EC tablet Take 1 tablet (81 mg total) by mouth daily. 06/10/19  Yes Danford, Suann Larry, MD  atorvastatin (LIPITOR) 40 MG tablet Take 1 tablet (40 mg total) by mouth daily at 6 PM. 12/30/15  Yes Dustin Flock, MD  digoxin (LANOXIN) 0.125 MG tablet Take 0.0625 mg by mouth daily.   Yes [provider]  famotidine (PEPCID) 20 MG tablet Take 20 mg by mouth at bedtime. 02/04/19  Yes [provider]  ferrous sulfate 325 (65 FE) MG tablet Take 1 tablet (325  mg total) by mouth every other day. Patient taking differently: Take 325 mg by mouth every Monday, Wednesday, and Friday.  03/04/19  Yes Verlon Au, NP  furosemide (LASIX) 40 MG tablet Take 1 tablet (40 mg total) by mouth daily. Patient taking differently: Take 20 mg by mouth daily.  06/10/19  Yes Danford, Suann Larry, MD  metoprolol tartrate (LOPRESSOR) 25 MG tablet Take 0.5 tablets (12.5 mg total) by mouth 2 (two) times daily. 01/09/19  Yes Gouru, Illene Silver, MD  ranolazine (RANEXA) 1000 MG SR tablet Take 500 mg by mouth 2 (two) times daily.  01/16/19  Yes [provider]  sucralfate (CARAFATE) 1 G tablet Take 1 g by mouth 2 (two) times daily.    Yes [provider]  tamsulosin (FLOMAX) 0.4 MG CAPS capsule Take 0.4 mg by mouth daily after supper.    Yes [provider]  vitamin B-12 (CYANOCOBALAMIN) 500 MCG tablet Take 500 mcg by mouth daily.   Yes [provider]  vitamin C (VITAMIN C) 250 MG tablet Take 1 tablet (250 mg total) by mouth daily. 11/01/15  Yes Dustin Flock, MD  acetaminophen (TYLENOL) 500 MG tablet Take 500-1,000 mg by mouth every 6 (six) hours as needed for mild pain, moderate pain or fever.     [provider]  docusate sodium (COLACE) 100 MG capsule Take 100 mg by mouth daily as needed for mild constipation.     [provider]  nitroGLYCERIN (NITROSTAT) 0.4 MG SL tablet Place 0.4 mg under the tongue every 5 (five) minutes as needed for chest pain.    [provider]  omeprazole (PRILOSEC) 20 MG capsule Take 1 capsule (20 mg total) by mouth daily. 07/29/19 07/28/20  Blake Divine, MD  polyethylene glycol [facility] /  GLYCOLAX) 17 g packet Take 17 g by mouth daily as needed for mild constipation. 01/09/19   Nicholes Mango, MD   Allergies  Allergen Reactions  . Levaquin [Levofloxacin]     [name], her daughter, said Levaquin should be avoided because when patient had Levaquin a few years ago, he had "a bad reaction".  She  said he could not walk and could not lift up his feet.  . 5ht3 Receptor Antagonists   . Diphenhydramine Hcl Other (See Comments)    Reaction:  Rash and fever a long time ago, but has had it since with no problem.  . Escitalopram Other (See Comments)    Reaction:  Makes him feel faint, like he was going to have a heart attack.  . Maxidex [Dexamethasone] Other (See Comments)    Reaction:  Unknown   . Prednisone Other (See Comments)    Pt states that this medication made him feel crazy.    . Serotonin Other (See Comments)    Tried 2 different types, lexapro and another one.  Reaction:  Made him feel like he was having a heart attack.   . Vytorin [Ezetimibe-Simvastatin] Other (See Comments)    Reaction:  Unknown   . Zocor [Simvastatin] Other (See Comments)    Reaction:  Unknown   . Diltiazem Rash    FAMILY HISTORY:  family history includes CAD in his mother; Colon cancer in his father. SOCIAL HISTORY:  reports that he quit smoking about 42 years ago. His smokeless tobacco use includes chew. He reports current alcohol use. He reports that he does not use drugs.     REVIEW OF SYSTEMS:  Positives in BOLD Constitutional: Negative for fever, chills, weight loss, malaise/fatigue and diaphoresis.  HENT: Negative for hearing loss, ear pain, nosebleeds, congestion, sore throat, neck pain, tinnitus and ear discharge.   Eyes: Negative for blurred vision, double vision, photophobia, pain, discharge and redness.  Respiratory: Negative for cough, hemoptysis, sputum production, shortness of breath, wheezing and stridor.   Cardiovascular: Negative for chest pain, palpitations, orthopnea, claudication, leg swelling and PND.  Gastrointestinal: Negative for heartburn, nausea, vomiting, , diarrhea, constipation, blood in stool and melena.  Genitourinary: Negative for dysuria, urgency, frequency, hematuria and flank pain.  Musculoskeletal: Negative for myalgias, back pain, joint pain and falls.  Skin:  Negative for itching and rash.  Neurological: Negative for dizziness, tingling, tremors, sensory change, speech change, focal weakness, seizures, loss of consciousness, weakness and headaches.  Endo/Heme/Allergies: Negative for environmental allergies and polydipsia. Does not bruise/bleed easily.  SUBJECTIVE:  Pt reports abdominal pain which has greatly improved Denies chest pain, SOB, N/V, diarrhea, dysuria, fever/chills On 6 mcg Levophed infusion  VITAL SIGNS: Temp:  [98.1 F (36.7 C)-103.1 F (39.5 C)] 98.7 F (37.1 C) (03/18 0816) Pulse Rate:  [58-100] 77 (03/18 0800) Resp:  [19-30] 23 (03/18 0800) BP: (76-151)/(37-69) 116/49 (03/18 0800) SpO2:  [90 %-100 %] 98 % (03/18 0800) Weight:  [92 kg-93.6 kg] 92 kg (03/18 0341)  PHYSICAL EXAMINATION: General:  Acutely ill appearing male, sitting in bed, on nasal cannula, in NAD Neuro:  Awake, A&O x4, speech clear, follow commands, no focal deficits, pupils PERRL HEENT:  Atraumatic, normocephalic, neck supple, no JVD Cardiovascular: Regular rate and rhythm, S1-S2, no murmurs, rubs, gallops, 1+ pulses Lungs: Clear to auscultation bilaterally, even, nonlabored, normal effort Abdomen: Obese, soft, mildly tender to palpation, no distention, no guarding or rebound tenderness, bowel sounds positive x4 Musculoskeletal: Appropriate bulk and tone given age, no deformities, no edema  Skin: Warm and dry, no obvious rashes, lesions, or ulcerations  Recent Labs  Lab 07/29/19 0825 07/29/19 2314 07/30/19 0224  NA 140 138 138  K 4.2 3.9 4.3  CL 104 106 105  CO2 23 24 24   BUN 30* 27* 28*  CREATININE 1.95* 1.95* 1.91*  GLUCOSE 122* 108* 111*   Recent Labs  Lab 07/29/19 0825 07/29/19 2314 07/30/19 0224  HGB 9.3* 8.8* 8.6*  HCT 29.4* 28.1* 27.0*  WBC 16.7* 54.5* 59.8*  PLT 149* 170 143*   NM Hepatobiliary Liver Func  Result Date: 07/29/2019 CLINICAL DATA:  Abnormal ultrasound EXAM: NUCLEAR MEDICINE HEPATOBILIARY IMAGING TECHNIQUE:  Sequential images of the abdomen were obtained out to 60 minutes following intravenous administration of radiopharmaceutical. 3.5 mg of morphine given intravenously and additional imaging over 30 minutes was performed. RADIOPHARMACEUTICALS:  5.72 mCi Tc-41m Choletec IV COMPARISON:  Ultrasound 07/29/2019, CT 08/08/2019, 03/26/2019 FINDINGS: Prompt uptake and biliary excretion of activity by the liver is seen. No gallbladder activity after 60 minutes. Normal excretion of radiotracer into the small bowel. Following morphine administration, gallbladder activity remains absent. IMPRESSION: Nonvisualization of the gallbladder despite morphine administration, cystic duct patency cannot be established and acute cholecystitis therefore cannot be excluded. Note that false positive examinations can occur in the setting of prolonged fasting, chronic cholecystitis, and hepatocellular dysfunction. Electronically Signed   By: KDonavan FoilM.D.   On: 07/29/2019 23:16   CT Abdomen Pelvis W Contrast  Result Date: 07/29/2019 CLINICAL DATA:  Abdominal pain, acute abdominal pain with difficulty urinating. EXAM: CT ABDOMEN AND PELVIS WITH CONTRAST TECHNIQUE: Multidetector CT imaging of the abdomen and pelvis was performed using the standard protocol following bolus administration of intravenous contrast. CONTRAST:  767mOMNIPAQUE IOHEXOL 300 MG/ML  SOLN COMPARISON:  03/26/2019 FINDINGS: Lower chest: Chronic right-sided pleural thickening has been present since 2017. No dense consolidation or pleural effusion. Signs of coronary artery calcification. No pericardial fluid. Hepatobiliary: Liver and biliary tree with normal appearance. Portal vein is patent. Pancreas: Pancreas is normal without signs of ductal dilation or peripancreatic inflammation. Spleen: Spleen is normal size without focal lesion. Adrenals/Urinary Tract: Adrenal glands are normal. Low-density lesion in the interpolar aspect of the left kidney shows water density  measuring 2.2 x 2.3 cm, unchanged dating back to 2018. Renal cortical scarring bilaterally. Low-density lesion in the right kidney also unchanged. This is in the lower pole. A smaller upper pole presumed cyst is also stable. Nephrolithiasis with 3-4 mm calculus in the lower pole of the right kidney. Tiny calculus in the distal left ureter is unchanged and is not associated with hydronephrosis or Peri ureteral stranding. No right ureteral calculi. Urinary bladder is unremarkable. Stomach/Bowel: The gastrointestinal tract shows no acute findings. Changes of appendectomy in the right lower quadrant. Small hiatal hernia. Stool fills much of the colon. Vascular/Lymphatic: Calcified and noncalcified plaque in the abdominal aorta no aneurysm. No adenopathy in the upper abdomen or retroperitoneum. No pelvic lymphadenopathy. Reproductive: Prostate is normal by CT. Other: No abdominal wall hernia or abnormality. No abdominopelvic ascites. Musculoskeletal: Spinal degenerative changes, no acute or destructive bone finding. IMPRESSION: 1. No acute abnormalities. 2. Stable nonobstructing calculus in the distal left ureter. 3. Nonobstructing calculus in the lower pole the right kidney is similar to the previous exam. 4. Bilateral renal cysts. 5. Coronary artery calcification. 6. Chronic right-sided pleural thickening, partially imaged may relate to prior trauma or infection. Aortic Atherosclerosis (ICD10-I70.0). Electronically Signed   By: GeZetta Bills.D.   On: 07/29/2019  10:38   DG Chest Port 1 View  Result Date: 07/30/2019 CLINICAL DATA:  Sepsis EXAM: PORTABLE CHEST 1 VIEW COMPARISON:  Radiograph 07/29/2019, CT 04/27/2019 FINDINGS: Redemonstration of the hazy interstitial opacities throughout both lungs which appear more focally confluent in the left lung base. There is obscuration of the left hemidiaphragm which may reflect some layering left effusion. No pneumothorax. Cardiomegaly is similar to prior. There are  postsurgical changes from prior CABG. Fracture of the second and third most superior wires is similar to comparison exam. Telemetry leads overlie the chest. IMPRESSION: Favor features of pulmonary edema with cardiomegaly and left effusion. Given the concern for sepsis, a more patchy opacity in left base could reflect infection. Electronically Signed   By: Lovena Le M.D.   On: 07/30/2019 03:36   DG Chest Port 1 View  Result Date: 07/29/2019 CLINICAL DATA:  Fever. EXAM: PORTABLE CHEST 1 VIEW COMPARISON:  June 07, 2019 FINDINGS: There are scattered pulmonary opacities bilaterally. There is no pneumothorax. There may be small bilateral pleural effusions. The heart size remains enlarged. The patient is status post prior median sternotomy. IMPRESSION: Cardiomegaly with probable vascular congestion and pulmonary edema. An underlying atypical infectious process cannot be excluded. Electronically Signed   By: Constance Holster M.D.   On: 07/29/2019 18:39   US Abdomen Limited RUQ  Result Date: 07/29/2019 CLINICAL DATA:  Right upper quadrant pain EXAM: ULTRASOUND ABDOMEN LIMITED RIGHT UPPER QUADRANT COMPARISON:  October 28, 2015 ultrasound right upper quadrant FINDINGS: Gallbladder: No gallstones. Gallbladder wall is borderline thickened. There is no appreciable pericholecystic fluid. There is slight sludge in the gallbladder. No sonographic Murphy sign noted by sonographer. Common bile duct: Diameter: 4 mm. No intrahepatic or extrahepatic biliary duct dilatation. Liver: No focal lesion identified. Within normal limits in parenchymal echogenicity. Portal vein is patent on color Doppler imaging with normal direction of blood flow towards the liver. Other: Right kidney appears somewhat echogenic. IMPRESSION: 1. Slight sludge in gallbladder. Gallbladder wall borderline thickened, less pronounced than on prior study. No gallstones are demonstrable. A degree of acalculus cholecystitis potentially could present in this  manner. This finding may warrant correlation with nuclear medicine hepatobiliary imaging study to assess for cystic duct patency. 2. Echogenic right kidney, a finding that may be indicative of medical renal disease. Appropriate renal function laboratory studies advised. 3.  Study otherwise unremarkable. Electronically Signed   By: Lowella Grip III M.D.   On: 07/29/2019 15:23      ASSESSMENT / PLAN:   Circulatory shock likely distributive due to sepsis -possible sepsis related - present on admission -etiology - acute acalculous cholecysitis -surgery consultation -IV zosyn/vancomycin -Levophed - wean down as possible  -IVF - gentle 1/2Liter LR due to severe systolic dysfunction -PT/OT when possible -random cortisol -procalcitonin >7  and Lactate >3 pointing toward infectious etiology of shock -HIDA scan unable to establish patency of cystic duct, therefor acute cholecystitis unable to be excluded  Acute on chronic systolic CHF with EF 41-28% -patient without peripheral but does have moderate pulmonary edema -gentle diuresis when BP improves and off vasopressors Mildly elevated troponin, likely demand ischemia -Continuous cardiac monitoring -Maintain MAP greater than 65 -lactic acid>3 - cortisol in process - BNP-1067   Chronic kidney disease stage III (currently stable) -Monitor I&O's / urinary output -Follow BMP -Ensure adequate renal perfusion -Avoid nephrotoxic agents as able -Replace electrolytes as indicated -Cautious IV fluids given CHF  Anemia of chronic disease  - CKD and MDS -heme/onc consult - appreciate eval  and recommendations -Monitor for S/Sx of bleeding -Trend CBC -SQ Heparin for VTE Prophylaxis  -Transfuse for Hgb <7  Severe leukocytosis  - possible due to sepsis however typically not this elevated , but would like to get input from onc regarding this due to MDS history     Chronic cystic lung disease - bilateral small thin walled asymetrically  distributed small and large sized cysts - largest >4CM LUL -consisted with LIP - seen in autoimmune disease such as Sjogrens and chronic immunodeficient state -no need for additianal testing or treatment of this lung disease due to likey insignificant clincal impact    Pt's prognosis is guarded and is at high risk of cardiac arrest and death.  DISPOSITION: ICU GOALS OF CARE: Full Code VTE PROPHYLAXIS: SQ Heparin STRESS ULCER PROPHYLAXIS: Pepcid UPDATES: Updated pt at bedside 07/29/19 CONSULTS: Hospitalist, General Surgery  Critical care provider statement:    Critical care time (minutes):  33   Critical care time was exclusive of:  Separately billable procedures and  treating other patients   Critical care was necessary to treat or prevent imminent or  life-threatening deterioration of the following conditions:  sepsis, MDS, severe leukocytosis, shock, CKD, CHF, pulmonary edema, anemia, abd pain, cholecystitis, multiple comorbid conditions   Critical care was time spent personally by me on the following  activities:  Development of treatment plan with patient or surrogate,  discussions with consultants, evaluation of patient's response to  treatment, examination of patient, obtaining history from patient or  surrogate, ordering and performing treatments and interventions, ordering  and review of laboratory studies and re-evaluation of patient's condition   I assumed direction of critical care for this patient from another  provider in my specialty: no      Ottie Glazier, M.D.  [facility]

## 2019-07-30 NOTE — Progress Notes (Addendum)
Discussed with Dr. Lanney Gins about patient having issue with emptying bladder. No urine output this shift.  Bladder scan revealed 376ml.  MD acknowledged information but no orders received  at this time.

## 2019-07-30 NOTE — OR Nursing (Signed)
Drain care instructions given pt pt and daughter. Daughter requesting home health to care for chole tube. Pt denies complaints. Care turned over to [facility]

## 2019-07-30 NOTE — Progress Notes (Signed)
Pt stable after PCT cholecystostomy.Vss.Abd stable.Drainage tube to bag drainage.F/u with his MD.Thankyou .

## 2019-07-30 NOTE — Progress Notes (Signed)
*  PRELIMINARY RESULTS* Echocardiogram 2D Echocardiogram has been performed.  [name] 07/30/2019, 9:32 AM

## 2019-07-30 NOTE — Progress Notes (Signed)
Initial Nutrition Assessment  DOCUMENTATION CODES:   Non-severe (moderate) malnutrition in context of chronic illness  INTERVENTION:  Once diet is advanced provide Ensure Max Protein po BID, each supplement provides 150 kcal and 30 grams of protein.  NUTRITION DIAGNOSIS:   Moderate Malnutrition related to chronic illness(CKD, CHF) as evidenced by mild fat depletion, mild muscle depletion, moderate muscle depletion.  GOAL:   Patient will meet greater than or equal to 90% of their needs  MONITOR:   PO intake, Supplement acceptance, Labs, Weight trends, I & O's  REASON FOR ASSESSMENT:   Consult, Malnutrition Screening Tool    ASSESSMENT:   86 year old male with PMHx of HTN, hx MI, CAD, anxiety, CKD, CHF admitted with severe sepsis with septic shock of unknown etiology (suspected source acalculous cholecystitis vs atypical PNA).   Met with patient and daughter this morning at bedside. Patient reports he has a fairly good appetite and eats well at home. He eats 2-3 meals per day and snacks between meals. Patient's daughter makes him a well-balanced breakfast and dinner and then patient usually has a sandwich at lunch. Patient reports he is unsure why he has been losing weight since his intake has not changed. He is amenable to drinking ONS to help meet calorie/protein needs once diet is advanced.  Patient's UBW was 250 lbs many years ago. Patient reports he slowly lost weight over time. Recently he has lost 3 kg (3.2% body weight) over the past 2 months, which is not significant for time frame.  Medications reviewed and include: famotidine, Lasix 20 mg daily, Flomax, norepinephrine gtt at 5 mcg/min, Zosyn.  Labs reviewed: BUN 28, Creatinine 1.91.  NUTRITION - FOCUSED PHYSICAL EXAM:    Most Recent Value  Orbital Region  Mild depletion  Upper Arm Region  Moderate depletion  Thoracic and Lumbar Region  Mild depletion  Buccal Region  Mild depletion  Temple Region  Moderate  depletion  Clavicle Bone Region  Mild depletion  Clavicle and Acromion Bone Region  Moderate depletion  Scapular Bone Region  Unable to assess  Dorsal Hand  Mild depletion  Patellar Region  Mild depletion  Anterior Thigh Region  Mild depletion  Posterior Calf Region  Mild depletion  Edema (RD Assessment)  None  Hair  Reviewed  Eyes  Reviewed  Mouth  Reviewed  Skin  Reviewed  Nails  Reviewed     Diet Order:   Diet Order            Diet NPO time specified Except for: Sips with Meds  Diet effective now             EDUCATION NEEDS:   No education needs have been identified at this time  Skin:  Skin Assessment: Reviewed RN Assessment  Last BM:  07/28/2019 per chart  Height:   Ht Readings from Last 1 Encounters:  07/29/19 6' 4"  (1.93 m)   Weight:   Wt Readings from Last 1 Encounters:  07/30/19 92 kg   Ideal Body Weight:  91.8 kg  BMI:  Body mass index is 24.69 kg/m.  Estimated Nutritional Needs:   Kcal:  2000-2200  Protein:  100-110 grams  Fluid:  2 L/day  Jacklynn Barnacle, MS, RD, [name] Pager number available on Amion

## 2019-07-30 NOTE — Consult Note (Signed)
Chief Complaint: Patient was seen in consultation today for CT guided percutaneous cholecystostomy Chief Complaint  Patient presents with  . Abdominal Pain  . Urinary Retention    Referring Physician(s): 64  Supervising Physician: Register,T  Patient Status: [facility] - In-pt  History of Present Illness: Andres Gutierrez is an 86 y.o. male with PMH sig for anemia, CM,CHF,CAD with prior MI/stenting, CKD,HTN who recently presented to [facility] with abd pain,N/V.  Subsequent nuclear medicine hepatobiliary scan revealed nonvisualization of the gallbladder despite morphine with inability to establish cystic duct patency concerning for acute cholecystitis.  Laboratory evaluation reveals WBC of 59.8, hemoglobin 8.6, platelets 143k, creatinine 1.91.  PT/INR pending.  He is COVID-19 negative.  Due to multiple medical comorbidities patient is not an appropriate candidate for cholecystectomy at this time.  Request now received from surgery for percutaneous cholecystostomy.  He is currently on Levophed with last BP 116/49.  Past Medical History:  Diagnosis Date  . Anemia   . Anxiety   . Cardiomyopathy ([place])   . CHF (congestive heart failure) ([place])   . Chronic kidney disease    kidney stones  . Coronary artery disease   . Cough   . Hypertension   . Lymphadenopathy, hilar   . MI (myocardial infarction) ([place])    x 2  . Wheezing     Past Surgical History:  Procedure Laterality Date  . BRONCHIAL NEEDLE ASPIRATION BIOPSY N/A 12/31/2014   Procedure: BRONCHIAL NEEDLE ASPIRATION BIOPSIES from carina;  Surgeon: Flora Lipps, MD;  Location: [facility] ORS;  Service: Cardiopulmonary;  Laterality: N/A;  . CARDIAC CATHETERIZATION N/A 12/28/2015   Procedure: Left Heart Cath and Cors/Grafts Angiography;  Surgeon: Yolonda Kida, MD;  Location: [facility];  Service: Cardiovascular;  Laterality: N/A;  . CATARACT EXTRACTION W/PHACO Left 08/01/2016   Procedure: CATARACT EXTRACTION PHACO AND INTRAOCULAR  LENS PLACEMENT ([place]) Left;  Surgeon: Leandrew Koyanagi, MD;  Location: [place];  Service: Ophthalmology;  Laterality: Left;  . CORONARY ANGIOPLASTY WITH STENT PLACEMENT    . CORONARY ARTERY BYPASS GRAFT    . ENDOBRONCHIAL ULTRASOUND N/A 12/31/2014   Procedure: ENDOBRONCHIAL ULTRASOUND;  Surgeon: Flora Lipps, MD;  Location: [facility] ORS;  Service: Cardiopulmonary;  Laterality: N/A;  . ESOPHAGOGASTRODUODENOSCOPY (EGD) WITH PROPOFOL N/A 06/20/2015   Procedure: ESOPHAGOGASTRODUODENOSCOPY (EGD) WITH PROPOFOL;  Surgeon: Lollie Sails, MD;  Location[facility] ENDOSCOPY;  Service: Endoscopy;  Laterality: N/A;    Allergies: Levaquin [levofloxacin], 5ht3 receptor antagonists, Diphenhydramine hcl, Escitalopram, Maxidex [dexamethasone], Prednisone, Serotonin, Vytorin [ezetimibe-simvastatin], Zocor [simvastatin], and Diltiazem  Medications: Prior to Admission medications   Medication Sig Start Date End Date Taking? Authorizing Provider  aspirin EC 81 MG EC tablet Take 1 tablet (81 mg total) by mouth daily. 06/10/19  Yes Danford, Suann Larry, MD  atorvastatin (LIPITOR) 40 MG tablet Take 1 tablet (40 mg total) by mouth daily at 6 PM. 12/30/15  Yes Dustin Flock, MD  digoxin (LANOXIN) 0.125 MG tablet Take 0.0625 mg by mouth daily.   Yes [provider]  famotidine (PEPCID) 20 MG tablet Take 20 mg by mouth at bedtime. 02/04/19  Yes [provider]  ferrous sulfate 325 (65 FE) MG tablet Take 1 tablet (325 mg total) by mouth every other day. Patient taking differently: Take 325 mg by mouth every Monday, Wednesday, and Friday.  03/04/19  Yes Verlon Au, NP  furosemide (LASIX) 40 MG tablet Take 1 tablet (40 mg total) by mouth daily. Patient taking differently: Take 20 mg by mouth daily.  06/10/19  Yes Danford, Suann Larry, MD  metoprolol tartrate (LOPRESSOR) 25 MG tablet Take 0.5 tablets (12.5 mg total) by mouth 2 (two) times daily. 01/09/19  Yes Gouru, Illene Silver, MD  ranolazine  (RANEXA) 1000 MG SR tablet Take 500 mg by mouth 2 (two) times daily.  01/16/19  Yes [provider]  sucralfate (CARAFATE) 1 G tablet Take 1 g by mouth 2 (two) times daily.    Yes [provider]  tamsulosin (FLOMAX) 0.4 MG CAPS capsule Take 0.4 mg by mouth daily after supper.    Yes [provider]  vitamin B-12 (CYANOCOBALAMIN) 500 MCG tablet Take 500 mcg by mouth daily.   Yes [provider]  vitamin C (VITAMIN C) 250 MG tablet Take 1 tablet (250 mg total) by mouth daily. 11/01/15  Yes Dustin Flock, MD  acetaminophen (TYLENOL) 500 MG tablet Take 500-1,000 mg by mouth every 6 (six) hours as needed for mild pain, moderate pain or fever.     [provider]  docusate sodium (COLACE) 100 MG capsule Take 100 mg by mouth daily as needed for mild constipation.     [provider]  nitroGLYCERIN (NITROSTAT) 0.4 MG SL tablet Place 0.4 mg under the tongue every 5 (five) minutes as needed for chest pain.    [provider]  omeprazole (PRILOSEC) 20 MG capsule Take 1 capsule (20 mg total) by mouth daily. 07/29/19 07/28/20  Blake Divine, MD  polyethylene glycol (MIRALAX / GLYCOLAX) 17 g packet Take 17 g by mouth daily as needed for mild constipation. 01/09/19   Nicholes Mango, MD     Family History  Problem Relation Age of Onset  . CAD Mother   . Colon cancer Father     Social History   Socioeconomic History  . Marital status: Widowed    Spouse name: Not on file  . Number of children: 2  . Years of education: college  . Highest education level: Some college, no degree  Occupational History  . Occupation: retired  Tobacco Use  . Smoking status: Former Smoker    Quit date: 06/27/1977    Years since quitting: 42.1  . Smokeless tobacco: Current User    Types: Chew  Substance and Sexual Activity  . Alcohol use: Yes    Comment: occational almost rare once a year  . Drug use: No  . Sexual activity: Never    Birth control/protection:  Abstinence  Other Topics Concern  . Not on file  Social History Narrative  . Not on file   Social Determinants of Health   Financial Resource Strain:   . Difficulty of Paying Living Expenses:   Food Insecurity:   . Worried About Charity fundraiser in the Last Year:   . Arboriculturist in the Last Year:   Transportation Needs:   . Film/video editor (Medical):   Marland Kitchen Lack of Transportation (Non-Medical):   Physical Activity:   . Days of Exercise per Week:   . Minutes of Exercise per Session:   Stress:   . Feeling of Stress :   Social Connections:   . Frequency of Communication with Friends and Family:   . Frequency of Social Gatherings with Friends and Family:   . Attends Religious Services:   . Active Member of Clubs or Organizations:   . Attends Archivist Meetings:   Marland Kitchen Marital Status:       Review of Systems he currently denies fever, headache, chest pain, worsening dyspnea,  cough, nausea, vomiting or bleeding.  He does have some intermittent abdominal and back pain.  Vital Signs: BP (!) 116/49   Pulse 77   Temp 98.7 F (37.1 C) (Oral)   Resp (!) 23   Ht 6\' 4"  (1.93 m)   Wt 202 lb 13.2 oz (92 kg)   SpO2 98%   BMI 24.69 kg/m   Physical Exam awake, alert.  Chest with slight diminished breath sounds right base, left clear.  Heart with normal rate, occasional ectopy noted.  Abdomen soft, positive bowel sounds, some minimal epigastric tenderness to palpation.  No lower extremity edema. Imaging: NM Hepatobiliary Liver Func  Result Date: 07/29/2019 CLINICAL DATA:  Abnormal ultrasound EXAM: NUCLEAR MEDICINE HEPATOBILIARY IMAGING TECHNIQUE: Sequential images of the abdomen were obtained out to 60 minutes following intravenous administration of radiopharmaceutical. 3.5 mg of morphine given intravenously and additional imaging over 30 minutes was performed. RADIOPHARMACEUTICALS:  5.72 mCi Tc-75m  Choletec IV COMPARISON:  Ultrasound 07/29/2019, CT 08/08/2019,  03/26/2019 FINDINGS: Prompt uptake and biliary excretion of activity by the liver is seen. No gallbladder activity after 60 minutes. Normal excretion of radiotracer into the small bowel. Following morphine administration, gallbladder activity remains absent. IMPRESSION: Nonvisualization of the gallbladder despite morphine administration, cystic duct patency cannot be established and acute cholecystitis therefore cannot be excluded. Note that false positive examinations can occur in the setting of prolonged fasting, chronic cholecystitis, and hepatocellular dysfunction. Electronically Signed   By: Donavan Foil M.D.   On: 07/29/2019 23:16   CT Abdomen Pelvis W Contrast  Result Date: 07/29/2019 CLINICAL DATA:  Abdominal pain, acute abdominal pain with difficulty urinating. EXAM: CT ABDOMEN AND PELVIS WITH CONTRAST TECHNIQUE: Multidetector CT imaging of the abdomen and pelvis was performed using the standard protocol following bolus administration of intravenous contrast. CONTRAST:  51mL OMNIPAQUE IOHEXOL 300 MG/ML  SOLN COMPARISON:  03/26/2019 FINDINGS: Lower chest: Chronic right-sided pleural thickening has been present since 2017. No dense consolidation or pleural effusion. Signs of coronary artery calcification. No pericardial fluid. Hepatobiliary: Liver and biliary tree with normal appearance. Portal vein is patent. Pancreas: Pancreas is normal without signs of ductal dilation or peripancreatic inflammation. Spleen: Spleen is normal size without focal lesion. Adrenals/Urinary Tract: Adrenal glands are normal. Low-density lesion in the interpolar aspect of the left kidney shows water density measuring 2.2 x 2.3 cm, unchanged dating back to 2018. Renal cortical scarring bilaterally. Low-density lesion in the right kidney also unchanged. This is in the lower pole. A smaller upper pole presumed cyst is also stable. Nephrolithiasis with 3-4 mm calculus in the lower pole of the right kidney. Tiny calculus in the  distal left ureter is unchanged and is not associated with hydronephrosis or Peri ureteral stranding. No right ureteral calculi. Urinary bladder is unremarkable. Stomach/Bowel: The gastrointestinal tract shows no acute findings. Changes of appendectomy in the right lower quadrant. Small hiatal hernia. Stool fills much of the colon. Vascular/Lymphatic: Calcified and noncalcified plaque in the abdominal aorta no aneurysm. No adenopathy in the upper abdomen or retroperitoneum. No pelvic lymphadenopathy. Reproductive: Prostate is normal by CT. Other: No abdominal wall hernia or abnormality. No abdominopelvic ascites. Musculoskeletal: Spinal degenerative changes, no acute or destructive bone finding. IMPRESSION: 1. No acute abnormalities. 2. Stable nonobstructing calculus in the distal left ureter. 3. Nonobstructing calculus in the lower pole the right kidney is similar to the previous exam. 4. Bilateral renal cysts. 5. Coronary artery calcification. 6. Chronic right-sided pleural thickening, partially imaged may relate to prior trauma or infection.  Aortic Atherosclerosis (ICD10-I70.0). Electronically Signed   By: Zetta Bills M.D.   On: 07/29/2019 10:38   DG Chest Port 1 View  Result Date: 07/30/2019 CLINICAL DATA:  Sepsis EXAM: PORTABLE CHEST 1 VIEW COMPARISON:  Radiograph 07/29/2019, CT 04/27/2019 FINDINGS: Redemonstration of the hazy interstitial opacities throughout both lungs which appear more focally confluent in the left lung base. There is obscuration of the left hemidiaphragm which may reflect some layering left effusion. No pneumothorax. Cardiomegaly is similar to prior. There are postsurgical changes from prior CABG. Fracture of the second and third most superior wires is similar to comparison exam. Telemetry leads overlie the chest. IMPRESSION: Favor features of pulmonary edema with cardiomegaly and left effusion. Given the concern for sepsis, a more patchy opacity in left base could reflect infection.  Electronically Signed   By: Lovena Le M.D.   On: 07/30/2019 03:36   DG Chest Port 1 View  Result Date: 07/29/2019 CLINICAL DATA:  Fever. EXAM: PORTABLE CHEST 1 VIEW COMPARISON:  June 07, 2019 FINDINGS: There are scattered pulmonary opacities bilaterally. There is no pneumothorax. There may be small bilateral pleural effusions. The heart size remains enlarged. The patient is status post prior median sternotomy. IMPRESSION: Cardiomegaly with probable vascular congestion and pulmonary edema. An underlying atypical infectious process cannot be excluded. Electronically Signed   By: Constance Holster M.D.   On: 07/29/2019 18:39   US Abdomen Limited RUQ  Result Date: 07/29/2019 CLINICAL DATA:  Right upper quadrant pain EXAM: ULTRASOUND ABDOMEN LIMITED RIGHT UPPER QUADRANT COMPARISON:  October 28, 2015 ultrasound right upper quadrant FINDINGS: Gallbladder: No gallstones. Gallbladder wall is borderline thickened. There is no appreciable pericholecystic fluid. There is slight sludge in the gallbladder. No sonographic Murphy sign noted by sonographer. Common bile duct: Diameter: 4 mm. No intrahepatic or extrahepatic biliary duct dilatation. Liver: No focal lesion identified. Within normal limits in parenchymal echogenicity. Portal vein is patent on color Doppler imaging with normal direction of blood flow towards the liver. Other: Right kidney appears somewhat echogenic. IMPRESSION: 1. Slight sludge in gallbladder. Gallbladder wall borderline thickened, less pronounced than on prior study. No gallstones are demonstrable. A degree of acalculus cholecystitis potentially could present in this manner. This finding may warrant correlation with nuclear medicine hepatobiliary imaging study to assess for cystic duct patency. 2. Echogenic right kidney, a finding that may be indicative of medical renal disease. Appropriate renal function laboratory studies advised. 3.  Study otherwise unremarkable. Electronically Signed    By: Lowella Grip III M.D.   On: 07/29/2019 15:23    Labs:  CBC: Recent Labs    07/20/19 1014 07/29/19 0825 07/29/19 2314 07/30/19 0224  WBC 12.5* 16.7* 54.5* 59.8*  HGB 9.7* 9.3* 8.8* 8.6*  HCT 31.8* 29.4* 28.1* 27.0*  PLT 182 149* 170 143*    COAGS: Recent Labs    06/07/19 1619  INR 1.1  APTT 34    BMP: Recent Labs    07/20/19 1014 07/29/19 0825 07/29/19 2314 07/30/19 0224  NA 135 140 138 138  K 4.1 4.2 3.9 4.3  CL 103 104 106 105  CO2 21* 23 24 24   GLUCOSE 123* 122* 108* 111*  BUN 30* 30* 27* 28*  CALCIUM 8.9 8.9 8.5* 8.3*  CREATININE 1.82* 1.95* 1.95* 1.91*  GFRNONAA 33* 30* 30* 31*  GFRAA 38* 35* 35* 36*    LIVER FUNCTION TESTS: Recent Labs    06/07/19 1453 06/16/19 1419 07/29/19 0825 07/29/19 2314  BILITOT 1.0 1.1 1.3* 1.9*  AST  29 20 20  36  ALT 24 25 19 18   ALKPHOS 60 64 58 55  PROT 7.5 7.9 7.4 6.8  ALBUMIN 4.0 4.7 4.0 3.8    TUMOR MARKERS: No results for input(s): AFPTM, CEA, CA199, CHROMGRNA in the last 8760 hours.  Assessment and Plan: 86 y.o. male with PMH sig for anemia, CM,CHF,CAD with prior MI/stenting, CKD,HTN who recently presented to [facility] with abd pain,N/V.  Subsequent nuclear medicine hepatobiliary scan revealed nonvisualization of the gallbladder despite morphine with inability to establish cystic duct patency concerning for acute cholecystitis.  Laboratory evaluation reveals WBC of 59.8, hemoglobin 8.6, platelets 143k, creatinine 1.91.  PT/INR pending.  He is COVID-19 negative.  Due to multiple medical comorbidities patient is not an appropriate candidate for cholecystectomy at this time.  Request now received from surgery for percutaneous cholecystostomy.  He is currently on Levophed with last BP 116/49. Imaging studies have been reviewed by Dr. Rodrigo Ran. Risks and benefits discussed with the patient/daughter including bleeding, infection, damage to adjacent structures, bowel perforation/fistula connection, and sepsis.  All of  the patient's questions were answered, patient is agreeable to proceed. Consent signed and in chart.  Procedure scheduled for today   Thank you for this interesting consult.  I greatly enjoyed meeting Andres Gutierrez and look forward to participating in their care.  A copy of this report was sent to the requesting provider on this date.  Electronically Signed: D. Rowe Robert, PA-C 07/30/2019, 9:07 AM   I spent a total of 30 minutes in face to face in clinical consultation, greater than 50% of which was counseling/coordinating care for image guided percutaneous cholecystostomy

## 2019-07-30 NOTE — TOC Initial Note (Signed)
Transition of Care [facility]) - Initial/Assessment Note    Patient Details  Name: Andres Gutierrez MRN: [id number] Date of Birth: 11-17-1930  Transition of Care [facility]) CM/SW Contact:    Magnus Ivan, LCSW Phone Number: 07/30/2019, 12:34 PM  Clinical Narrative:                CSW met with patient and patient's daughter, Andres Gutierrez, at bedside. Explained CSW role. Patient lives alone and Andres Gutierrez has been providing transportation over the last few months as patient has not been able to drive himself. Andres Gutierrez and patient's son are involved and supportive. They both work full time, Andres Gutierrez reported she feels patient will need Home Health or SNF rehab at discharge, depending on what is recommended. Andres Gutierrez stated patient had [facility] in the past, but could not remember which agency. Patient has [facility] to rehab [facility]. PCP is Dr. Sabra Heck. Patient uses [facility] representative in [place], and denied issues with obtaining meds. Patient has a cane, walker, shower chair, and BSC at home. CSW will continue to follow, informed Andres Gutierrez that [facility] will follow[facility]up once patient has been assessed by OT and PT. She verbalized understanding.     Barriers to Discharge: Continued Medical Work up   Patient Goals and CMS Choice        Expected Discharge Plan and Services         Living arrangements for the past 2 months: Single Family Home                                      Prior Living Arrangements/Services Living arrangements for the past 2 months: Single Family Home Lives with:: Self Patient language and need for interpreter reviewed:: Yes Do you feel safe going back to the place where you live?: Yes      Need for Family Participation in Patient Care: Yes (Comment) Care giver support system in place?: Yes (comment) Current home services: DME Criminal Activity/Legal Involvement Pertinent to Current Situation/Hospitalization: No - Comment as needed  Activities of Daily Living Home Assistive  Devices/Equipment: Environmental consultant (specify type), Shower chair without back ADL Screening (condition at time of admission) Patient's cognitive ability adequate to safely complete daily activities?: Yes Is the patient deaf or have difficulty hearing?: Yes Does the patient have difficulty seeing, even when wearing glasses/contacts?: No Does the patient have difficulty concentrating, remembering, or making decisions?: No Patient able to express need for assistance with ADLs?: Yes Does the patient have difficulty dressing or bathing?: No Independently performs ADLs?: Yes (appropriate for developmental age) Does the patient have difficulty walking or climbing stairs?: No Weakness of Legs: None Weakness of Arms/Hands: None  Permission Sought/Granted   Permission granted to share information with : Yes, Verbal Permission Granted     Permission granted to share info w AGENCY: [place] agencies, SNFs        Emotional Assessment Appearance:: Appears stated age Attitude/Demeanor/Rapport: Engaged Affect (typically observed): Appropriate, Calm Orientation: : Oriented to Self, Oriented to Place, Oriented to Situation, Oriented to  Time Alcohol / Substance Use: Not Applicable Psych Involvement: No (comment)  Admission diagnosis:  Epigastric pain [R10.13] RUQ pain [R10.11] Fever [R50.9] Sepsis ([place]) [A41.9] Non-intractable vomiting with nausea, unspecified vomiting type [R11.2] Patient Active Problem List   Diagnosis Date Noted  . Sepsis ([place]) 07/29/2019  . Acute cholecystitis 07/29/2019  . Foot infection   . Acute on chronic heart failure (  [place]) 06/07/2019  . Weakness   . NSTEMI (non-ST elevated myocardial infarction) ([place])   . Orthostatic hypotension 01/07/2019  . Macrocytic anemia 08/05/2018  . Anemia of chronic kidney failure, stage 3 (moderate) 08/05/2018  . BPH (benign prostatic hyperplasia) 07/24/2018  . Gastroesophageal reflux disease with esophagitis 07/24/2018  . Stage 3 chronic kidney  disease 07/24/2018  . Thrombocytopenia ([place]) 07/24/2018  . Primary osteoarthritis of both knees 08/22/2017  . Chronic systolic heart failure ([place]) 01/17/2016  . Chewing tobacco use 01/17/2016  . Atrial fibrillation with RVR ([place]) 12/23/2015  . Dyspnea on exertion 12/23/2015  . Elevated troponin 12/23/2015  . GERD (gastroesophageal reflux disease) 12/23/2015  . Iron deficiency anemia due to chronic blood loss 12/20/2015  . Femoral neck fracture, left, closed, initial encounter 11/03/2015  . Chronic atrial fibrillation ([place])   . Coronary artery disease involving native coronary artery of native heart without angina pectoris   . Acalculous cholecystitis 10/29/2015  . Adenopathy   . CAD (coronary artery disease) 12/22/2014  . HTN (hypertension) 12/22/2014   PCP:  Rusty Aus, MD Pharmacy:   [facility] [place], [place] [facility] [address] AT [place] [address] Phone: [phone] Fax: [phone]     Social Determinants of Health (SDOH) Interventions    Readmission Risk Interventions Readmission Risk Prevention Plan 07/30/2019 06/08/2019  Transportation Screening Complete Complete  PCP or Specialist Appt within 3-5 Days Complete Complete  HRI or Home Care Consult Complete Complete  Social Work Consult for [place] Planning/Counseling - Complete  Palliative Care Screening - Not Applicable  Medication Review Press photographer) Complete Complete  Some recent data might be hidden

## 2019-07-30 NOTE — Consult Note (Signed)
[name] CONSULT NOTE  Patient Care Team: Rusty Aus, MD as PCP - General (Internal Medicine) Alisa Graff, FNP as Nurse Practitioner (Family Medicine) Ubaldo Glassing Javier Docker, MD as Consulting Physician (Cardiology) Erby Pian, MD as Referring Physician (Specialist) Cammie Sickle, MD as Consulting Physician (Hematology and Oncology)  CHIEF COMPLAINTS/PURPOSE OF CONSULTATION: Acute leukocytosis/MDS  HISTORY OF PRESENTING ILLNESS:  Andres Gutierrez 86 y.o.  male with multiple medical problems including anemia -MDS low-grade/CKD; intermittent thrombocytopenia; CHF-currently admitted to hospital for acute onset of abdominal pain.  On further evaluation the hospital patient noted to have-acute cholecystitis.  Patient noted to have significant elevation of white count-around 50-60,000; oncology has been consulted for further evaluation and recommendations for possibility of acute leukemic process.  Patient is currently in the ICU; on 5 mics of Levophed.  Patient also on broad-spectrum antibiotics.  Patient has longstanding history of anemia-suspected secondary to low-grade MDS/CKD; currently on IV iron and Aranesp outpatient.  Patient also has a history of thrombocytopenia-which is intermittent.  His regards to acute cholecystitis -patient has been evaluated by surgery-recommended cholecystostomy tube.   Currently patient's abdominal pain improved.  He is comfortable.  Review of Systems  Constitutional: Negative for chills, diaphoresis, fever, malaise/fatigue and weight loss.  HENT: Negative for nosebleeds and sore throat.   Eyes: Negative for double vision.  Respiratory: Negative for cough, hemoptysis, sputum production, shortness of breath and wheezing.   Cardiovascular: Negative for chest pain, palpitations, orthopnea and leg swelling.  Gastrointestinal: Positive for abdominal pain and nausea. Negative for blood in stool, constipation, diarrhea, heartburn, melena  and vomiting.  Genitourinary: Negative for dysuria, frequency and urgency.  Musculoskeletal: Negative for back pain and joint pain.  Skin: Negative.  Negative for itching and rash.  Neurological: Negative for dizziness, tingling, focal weakness, weakness and headaches.  Endo/Heme/Allergies: Does not bruise/bleed easily.  Psychiatric/Behavioral: Negative for depression. The patient is not nervous/anxious and does not have insomnia.      MEDICAL HISTORY:  Past Medical History:  Diagnosis Date  . Anemia   . Anxiety   . Cardiomyopathy ([place])   . CHF (congestive heart failure) ([place])   . Chronic kidney disease    kidney stones  . Coronary artery disease   . Cough   . Hypertension   . Lymphadenopathy, hilar   . MI (myocardial infarction) ([place])    x 2  . Wheezing     SURGICAL HISTORY: Past Surgical History:  Procedure Laterality Date  . BRONCHIAL NEEDLE ASPIRATION BIOPSY N/A 12/31/2014   Procedure: BRONCHIAL NEEDLE ASPIRATION BIOPSIES from [place]ina;  Surgeon: Flora Lipps, MD;  Location: [facility] ORS;  Service: Cardiopulmonary;  Laterality: N/A;  . CARDIAC CATHETERIZATION N/A 12/28/2015   Procedure: Left Heart Cath and Cors/Grafts Angiography;  Surgeon: Yolonda Kida, MD;  Location: [facility];  Service: Cardiovascular;  Laterality: N/A;  . CATARACT EXTRACTION W/PHACO Left 08/01/2016   Procedure: CATARACT EXTRACTION PHACO AND INTRAOCULAR LENS PLACEMENT ([place]) Left;  Surgeon: Leandrew Koyanagi, MD;  Location: [facility];  Service: Ophthalmology;  Laterality: Left;  . CORONARY ANGIOPLASTY WITH STENT PLACEMENT    . CORONARY ARTERY BYPASS GRAFT    . ENDOBRONCHIAL ULTRASOUND N/A 12/31/2014   Procedure: ENDOBRONCHIAL ULTRASOUND;  Surgeon: Flora Lipps, MD;  Location: [facility] ORS;  Service: Cardiopulmonary;  Laterality: N/A;  . ESOPHAGOGASTRODUODENOSCOPY (EGD) WITH PROPOFOL N/A 06/20/2015   Procedure: ESOPHAGOGASTRODUODENOSCOPY (EGD) WITH PROPOFOL;  Surgeon: Lollie Sails, MD;   Location: [facility] ENDOSCOPY;  Service: Endoscopy;  Laterality: N/A;    SOCIAL HISTORY: Social History   Socioeconomic History  . Marital status: Widowed    Spouse name: Not on file  . Number of children: 2  . Years of education: college  . Highest education level: Some college, no degree  Occupational History  . Occupation: retired  Tobacco Use  . Smoking status: Former Smoker    Quit date: 06/27/1977    Years since quitting: 42.1  . Smokeless tobacco: Current User    Types: Chew  Substance and Sexual Activity  . Alcohol use: Yes    Comment: occational almost rare once a year  . Drug use: No  . Sexual activity: Never    Birth control/protection: Abstinence  Other Topics Concern  . Not on file  Social History Narrative  . Not on file   Social Determinants of Health   Financial Resource Strain:   . Difficulty of Paying Living Expenses:   Food Insecurity:   . Worried About Charity fundraiser in the Last Year:   . Arboriculturist in the Last Year:   Transportation Needs:   . Film/video editor (Medical):   Marland Kitchen Lack of Transportation (Non-Medical):   Physical Activity:   . Days of Exercise per Week:   . Minutes of Exercise per Session:   Stress:   . Feeling of Stress :   Social Connections:   . Frequency of Communication with Friends and Family:   . Frequency of Social Gatherings with Friends and Family:   . Attends Religious Services:   . Active Member of Clubs or Organizations:   . Attends Archivist Meetings:   Marland Kitchen Marital Status:   Intimate Partner Violence:   . Fear of Current or Ex-Partner:   . Emotionally Abused:   Marland Kitchen Physically Abused:   . Sexually Abused:     FAMILY HISTORY: Family History  Problem Relation Age of Onset  . CAD Mother   . Colon cancer Father     ALLERGIES:  is allergic to levaquin [levofloxacin]; 5ht3 receptor antagonists; diphenhydramine hcl; escitalopram; maxidex [dexamethasone]; prednisone; serotonin; vytorin  [ezetimibe-simvastatin]; zocor [simvastatin]; and diltiazem.  MEDICATIONS:  Current Facility-Administered Medications  Medication Dose Route Frequency Provider Last Rate Last Admin  . 0.9 %  sodium chloride infusion  250 mL Intravenous Continuous Carrie Mew, MD   Stopped at 07/30/19 1455  . acetaminophen (TYLENOL) tablet 650 mg  650 mg Oral Q6H PRN Jennye Boroughs, MD       Or  . acetaminophen (TYLENOL) suppository 650 mg  650 mg Rectal Q6H PRN Jennye Boroughs, MD      . aspirin EC tablet 81 mg  81 mg Oral Daily Jennye Boroughs, MD      . atorvastatin (LIPITOR) tablet 40 mg  40 mg Oral q1800 Jennye Boroughs, MD   40 mg at 07/30/19 1733  . Chlorhexidine Gluconate Cloth 2 % PADS 6 each  6 each Topical Daily Jennye Boroughs, MD   6 each at 07/30/19 1237  . digoxin (LANOXIN) tablet 0.0625 mg  0.0625 mg Oral Daily Jennye Boroughs, MD      . docusate sodium (COLACE) capsule 100 mg  100 mg Oral Daily PRN Jennye Boroughs, MD      . famotidine (PEPCID) tablet 20 mg  20 mg Oral QHS Jennye Boroughs, MD   20 mg at 07/29/19 2324  . furosemide (LASIX) tablet 20 mg  20 mg Oral Daily Jennye Boroughs, MD      . heparin  injection 5,000 Units  5,000 Units Subcutaneous Q8H Allred, Darrell K, PA-C      . HYDROcodone-acetaminophen (NORCO/VICODIN) 5-325 MG per tablet 1 tablet  1 tablet Oral Q6H PRN Ottie Glazier, MD   1 tablet at 07/30/19 1733  . MEDLINE mouth rinse  15 mL Mouth Rinse BID Darel Hong D, NP   15 mL at 07/30/19 1237  . metoprolol tartrate (LOPRESSOR) tablet 12.5 mg  12.5 mg Oral BID Jennye Boroughs, MD      . nitroGLYCERIN (NITROSTAT) SL tablet 0.4 mg  0.4 mg Sublingual Q5 min PRN Jennye Boroughs, MD      . norepinephrine (LEVOPHED) 47m in 2551mpremix infusion  2-10 mcg/min Intravenous Titrated StCarrie MewMD   Stopped at 07/30/19 1253  . piperacillin-tazobactam (ZOSYN) IVPB 3.375 g  3.375 g Intravenous Q8H Nazari, Walid A, RPH 12.5 mL/hr at 07/30/19 1800 Rate Verify at 07/30/19 1800  .  polyethylene glycol (MIRALAX / GLYCOLAX) packet 17 g  17 g Oral Daily PRN AyJennye BoroughsMD      . promethazine (PHENERGAN) tablet 12.5 mg  12.5 mg Oral Q6H PRN AyJennye BoroughsMD      . ranolazine (RANEXA) 12 hr tablet 500 mg  500 mg Oral BID AyJennye BoroughsMD   500 mg at 07/29/19 2324  . sucralfate (CARAFATE) tablet 1 g  1 g Oral BID AyJennye BoroughsMD   1 g at 07/29/19 2324  . tamsulosin (FLOMAX) capsule 0.4 mg  0.4 mg Oral QPC supper AyJennye BoroughsMD   0.4 mg at 07/30/19 1733  . vancomycin variable dose per unstable renal function (pharmacist dosing)   Does not apply See admin instructions AlOttie GlazierMD          .  PHYSICAL EXAMINATION:  Vitals:   07/30/19 1730 07/30/19 1800  BP: (!) 113/59 (!) 97/50  Pulse: 94 81  Resp: (!) 29 (!) 26  Temp:    SpO2: 99% 96%   Filed Weights   07/29/19 0802 07/29/19 2305 07/30/19 0341  Weight: 195 lb (88.5 kg) 206 lb 5.6 oz (93.6 kg) 202 lb 13.2 oz (92 kg)    Physical Exam  Constitutional: He is oriented to person, place, and time and well-developed, well-nourished, and in no distress.  Elderly Caucasian male patient.  Resting in the bed.  No acute distress.  Accompanied by his daughter.  HENT:  Head: Normocephalic and atraumatic.  Mouth/Throat: Oropharynx is clear and moist. No oropharyngeal exudate.  Eyes: Pupils are equal, round, and reactive to light.  Cardiovascular: Normal rate and regular rhythm.  Pulmonary/Chest: No respiratory distress. He has no wheezes.  Decreased air entry bilaterally.  No wheeze or crackles.  Abdominal: Soft. Bowel sounds are normal. He exhibits no distension and no mass. There is no abdominal tenderness. There is no rebound and no guarding.  Musculoskeletal:        General: No tenderness or edema. Normal range of motion.     Cervical back: Normal range of motion and neck supple.  Neurological: He is alert and oriented to person, place, and time.  Skin: Skin is warm.  Psychiatric: Affect normal.      LABORATORY DATA:  I have reviewed the data as listed Lab Results  Component Value Date   WBC 46.6 (H) 07/30/2019   HGB 8.6 (L) 07/30/2019   HCT 27.3 (L) 07/30/2019   MCV 106.6 (H) 07/30/2019   PLT 122 (L) 07/30/2019   Recent Labs    01/07/19 0711 01/08/19 0428 06/16/19  1419 06/22/19 1003 07/29/19 0825 07/29/19 2314 07/30/19 0224  NA 140   < > 136   < > 140 138 138  K 4.3   < > 4.1   < > 4.2 3.9 4.3  CL 109   < > 104   < > 104 106 105  CO2 21*   < > 21*   < > 23 24 24   GLUCOSE 99   < > 141*   < > 122* 108* 111*  BUN 24*   < > 56*   < > 30* 27* 28*  CREATININE 1.51*   < > 2.18*   < > 1.95* 1.95* 1.91*  CALCIUM 8.8*   < > 9.4   < > 8.9 8.5* 8.3*  GFRNONAA 42*   < > 26*   < > 30* 30* 31*  GFRAA 48*   < > 31*   < > 35* 35* 36*  PROT 7.8   < > 7.9  --  7.4 6.8  --   ALBUMIN 3.8   < > 4.7  --  4.0 3.8  --   AST 21   < > 20  --  20 36  --   ALT 15   < > 25  --  19 18  --   ALKPHOS 59   < > 64  --  58 55  --   BILITOT 0.9   < > 1.1  --  1.3* 1.9*  --   BILIDIR 0.2  --   --   --   --   --   --   IBILI 0.7  --   --   --   --   --   --    < > = values in this interval not displayed.    RADIOGRAPHIC STUDIES: I have personally reviewed the radiological images as listed and agreed with the findings in the report. NM Hepatobiliary Liver Func  Result Date: 07/29/2019 CLINICAL DATA:  Abnormal ultrasound EXAM: NUCLEAR MEDICINE HEPATOBILIARY IMAGING TECHNIQUE: Sequential images of the abdomen were obtained out to 60 minutes following intravenous administration of radiopharmaceutical. 3.5 mg of morphine given intravenously and additional imaging over 30 minutes was performed. RADIOPHARMACEUTICALS:  5.72 mCi Tc-81m Choletec IV COMPARISON:  Ultrasound 07/29/2019, CT 08/08/2019, 03/26/2019 FINDINGS: Prompt uptake and biliary excretion of activity by the liver is seen. No gallbladder activity after 60 minutes. Normal excretion of radiotracer into the small bowel. Following morphine  administration, gallbladder activity remains absent. IMPRESSION: Nonvisualization of the gallbladder despite morphine administration, cystic duct patency cannot be established and acute cholecystitis therefore cannot be excluded. Note that false positive examinations can occur in the setting of prolonged fasting, chronic cholecystitis, and hepatocellular dysfunction. Electronically Signed   By: KDonavan FoilM.D.   On: 07/29/2019 23:16   CT Abdomen Pelvis W Contrast  Result Date: 07/29/2019 CLINICAL DATA:  Abdominal pain, acute abdominal pain with difficulty urinating. EXAM: CT ABDOMEN AND PELVIS WITH CONTRAST TECHNIQUE: Multidetector CT imaging of the abdomen and pelvis was performed using the standard protocol following bolus administration of intravenous contrast. CONTRAST:  774mOMNIPAQUE IOHEXOL 300 MG/ML  SOLN COMPARISON:  03/26/2019 FINDINGS: Lower chest: Chronic right-sided pleural thickening has been present since 2017. No dense consolidation or pleural effusion. Signs of coronary artery calcification. No pericardial fluid. Hepatobiliary: Liver and biliary tree with normal appearance. Portal vein is patent. Pancreas: Pancreas is normal without signs of ductal dilation or peripancreatic inflammation. Spleen: Spleen is normal size without  focal lesion. Adrenals/Urinary Tract: Adrenal glands are normal. Low-density lesion in the interpolar aspect of the left kidney shows water density measuring 2.2 x 2.3 cm, unchanged dating back to 2018. Renal cortical scarring bilaterally. Low-density lesion in the right kidney also unchanged. This is in the lower pole. A smaller upper pole presumed cyst is also stable. Nephrolithiasis with 3-4 mm calculus in the lower pole of the right kidney. Tiny calculus in the distal left ureter is unchanged and is not associated with hydronephrosis or Peri ureteral stranding. No right ureteral calculi. Urinary bladder is unremarkable. Stomach/Bowel: The gastrointestinal tract shows  no acute findings. Changes of appendectomy in the right lower quadrant. Small hiatal hernia. Stool fills much of the colon. Vascular/Lymphatic: Calcified and noncalcified plaque in the abdominal aorta no aneurysm. No adenopathy in the upper abdomen or retroperitoneum. No pelvic lymphadenopathy. Reproductive: Prostate is normal by CT. Other: No abdominal wall hernia or abnormality. No abdominopelvic ascites. Musculoskeletal: Spinal degenerative changes, no acute or destructive bone finding. IMPRESSION: 1. No acute abnormalities. 2. Stable nonobstructing calculus in the distal left ureter. 3. Nonobstructing calculus in the lower pole the right kidney is similar to the previous exam. 4. Bilateral renal cysts. 5. Coronary artery calcification. 6. Chronic right-sided pleural thickening, partially imaged may relate to prior trauma or infection. Aortic Atherosclerosis (ICD10-I70.0). Electronically Signed   By: Zetta Bills M.D.   On: 07/29/2019 10:38   DG Chest Port 1 View  Result Date: 07/30/2019 CLINICAL DATA:  Sepsis EXAM: PORTABLE CHEST 1 VIEW COMPARISON:  Radiograph 07/29/2019, CT 04/27/2019 FINDINGS: Redemonstration of the hazy interstitial opacities throughout both lungs which appear more focally confluent in the left lung base. There is obscuration of the left hemidiaphragm which may reflect some layering left effusion. No pneumothorax. Cardiomegaly is similar to prior. There are postsurgical changes from prior CABG. Fracture of the second and third most superior wires is similar to comparison exam. Telemetry leads overlie the chest. IMPRESSION: Favor features of pulmonary edema with cardiomegaly and left effusion. Given the concern for sepsis, a more patchy opacity in left base could reflect infection. Electronically Signed   By: Lovena Le M.D.   On: 07/30/2019 03:36   DG Chest Port 1 View  Result Date: 07/29/2019 CLINICAL DATA:  Fever. EXAM: PORTABLE CHEST 1 VIEW COMPARISON:  June 07, 2019  FINDINGS: There are scattered pulmonary opacities bilaterally. There is no pneumothorax. There may be small bilateral pleural effusions. The heart size remains enlarged. The patient is status post prior median sternotomy. IMPRESSION: Cardiomegaly with probable vascular congestion and pulmonary edema. An underlying atypical infectious process cannot be excluded. Electronically Signed   By: Constance Holster M.D.   On: 07/29/2019 18:39   ECHOCARDIOGRAM COMPLETE  Result Date: 07/30/2019    ECHOCARDIOGRAM REPORT   Patient Name:   Andres Gutierrez Date of Exam: 07/30/2019 Medical Rec #:  [id number]    Height:       76.0 in Accession #:    [id number]   Weight:       202.8 lb Date of Birth:  1931/02/14    BSA:          2.228 m Patient Age:    76 years     BP:           116/49 mmHg Patient Gender: M            HR:           77 bpm. Exam Location:  [facility] Procedure: 2D Echo,  Cardiac Doppler and Color Doppler Indications:     CHF- acute systolic 829.56  History:         Patient has prior history of Echocardiogram examinations, most                  recent 06/08/2019. CHF, CAD; Risk Factors:Hypertension.                  Cardiomyopathy, MI.  Sonographer:     [name] RDCS (AE) Referring Phys:  [id number] Bradly Bienenstock Diagnosing Phys: Yolonda Kida MD IMPRESSIONS  1. Left ventricular ejection fraction, by estimation, is 25 to 30%. The left ventricle has severely decreased function. The left ventricle demonstrates global hypokinesis. The left ventricular internal cavity size was moderately to severely dilated. Left ventricular diastolic parameters are consistent with Grade I diastolic dysfunction (impaired relaxation).  2. Right ventricular systolic function is moderately reduced. The right ventricular size is moderately enlarged. There is normal pulmonary artery systolic pressure.  3. Left atrial size was moderately dilated.  4. Right atrial size was moderately dilated.  5. The mitral valve is grossly normal. Mild mitral  valve regurgitation.  6. Tricuspid valve regurgitation is mild to moderate.  7. The aortic valve is grossly normal. Aortic valve regurgitation is not visualized. FINDINGS  Left Ventricle: Left ventricular ejection fraction, by estimation, is 25 to 30%. The left ventricle has severely decreased function. The left ventricle demonstrates global hypokinesis. The left ventricular internal cavity size was moderately to severely  dilated. There is no left ventricular hypertrophy. Left ventricular diastolic parameters are consistent with Grade I diastolic dysfunction (impaired relaxation). Right Ventricle: The right ventricular size is moderately enlarged. No increase in right ventricular wall thickness. Right ventricular systolic function is moderately reduced. There is normal pulmonary artery systolic pressure. The tricuspid regurgitant velocity is 1.78 m/s, and with an assumed right atrial pressure of 10 mmHg, the estimated right ventricular systolic pressure is 78.4 mmHg. Left Atrium: Left atrial size was moderately dilated. Right Atrium: Right atrial size was moderately dilated. Pericardium: There is no evidence of pericardial effusion. Mitral Valve: The mitral valve is grossly normal. Mild mitral valve regurgitation. Tricuspid Valve: The tricuspid valve is grossly normal. Tricuspid valve regurgitation is mild to moderate. Aortic Valve: The aortic valve is grossly normal. Aortic valve regurgitation is not visualized. Aortic valve mean gradient measures 5.7 mmHg. Aortic valve peak gradient measures 8.1 mmHg. Aortic valve area, by VTI measures 2.31 cm. Pulmonic Valve: The pulmonic valve was normal in structure. Pulmonic valve regurgitation is trivial. Aorta: The aortic root is normal in size and structure. IAS/Shunts: No atrial level shunt detected by color flow Doppler.  LEFT VENTRICLE PLAX 2D LVIDd:         5.53 cm  Diastology LVIDs:         3.93 cm  LV e' lateral:   5.22 cm/s LV PW:         1.24 cm  LV E/e' lateral:  20.3 LV IVS:        1.09 cm  LV e' medial:    2.94 cm/s LVOT diam:     2.10 cm  LV E/e' medial:  36.1 LV SV:         58 LV SV Index:   26 LVOT Area:     3.46 cm  RIGHT VENTRICLE RV Basal diam:  3.41 cm TAPSE (M-mode): 3.4 cm LEFT ATRIUM              Index  RIGHT ATRIUM           Index LA diam:        5.50 cm  2.47 cm/m  RA Area:     26.60 cm LA Vol (A2C):   119.0 ml 53.40 ml/m RA Volume:   82.00 ml  36.80 ml/m LA Vol (A4C):   92.8 ml  41.64 ml/m LA Biplane Vol: 106.0 ml 47.57 ml/m  AORTIC VALVE                    PULMONIC VALVE AV Area (Vmax):    2.25 cm     PV Vmax:        1.13 m/s AV Area (Vmean):   2.07 cm     PV Peak grad:   5.1 mmHg AV Area (VTI):     2.31 cm     RVOT Peak grad: 8 mmHg AV Vmax:           142.33 cm/s AV Vmean:          105.367 cm/s AV VTI:            0.252 m AV Peak Grad:      8.1 mmHg AV Mean Grad:      5.7 mmHg LVOT Vmax:         92.40 cm/s LVOT Vmean:        63.100 cm/s LVOT VTI:          0.168 m LVOT/AV VTI ratio: 0.67  AORTA Ao Root diam: 3.50 cm MITRAL VALVE                TRICUSPID VALVE MV Area (PHT): 5.02 cm     TR Peak grad:   12.7 mmHg MV Decel Time: 151 msec     TR Vmax:        178.00 cm/s MV E velocity: 106.00 cm/s MV A velocity: 85.40 cm/s   SHUNTS MV E/A ratio:  1.24         Systemic VTI:  0.17 m                             Systemic Diam: 2.10 cm Yolonda Kida MD Electronically signed by Yolonda Kida MD Signature Date/Time: 07/30/2019/10:27:39 AM    Final    CT PERC CHOLECYSTOSTOMY  Result Date: 07/30/2019 INDICATION: Possible cholecystitis. EXAM: PERCUTANEOUS CHOLECYSTOSTOMY. MEDICATIONS: Administered inpatient medications. 5000 unit dosage of heparin discontinued this morning. ANESTHESIA/SEDATION: Moderate (conscious) sedation was employed during this procedure. A total of Versed 1.0 mg and Fentanyl 75 mcg was administered intravenously. Moderate Sedation Time: 25 minutes. The patient's level of consciousness and vital signs were monitored  continuously by radiology nursing throughout the procedure under my direct supervision. FLUOROSCOPY TIME:  Fluoroscopy Time: 0 minutes 0 seconds COMPLICATIONS: None immediate. PROCEDURE: After obtaining informed consent. Patient was sterilely prepped and draped. Gallbladder was located with CT guidance. Patient sterilely prepped and draped. Following local anesthesia with 1% lidocaine and IV conscious sedation an 18 gauge needle was advanced into the gallbladder under CT guidance. A Amplatz 035 wire was placed. An 8 French pigtail drainage catheter was placed. Confirmation of a good catheter placement was made with CT. 20 cc of slightly opaque bile was removed and sent to microbiology for further evaluation. The catheter was sutured into place and placed to bag drainage. There were no complications. IMPRESSION: Successful percutaneous cholecystostomy. Electronically Signed   By: Marcello Moores  Register  On: 07/30/2019 15:15   US Abdomen Limited RUQ  Result Date: 07/29/2019 CLINICAL DATA:  Right upper quadrant pain EXAM: ULTRASOUND ABDOMEN LIMITED RIGHT UPPER QUADRANT COMPARISON:  October 28, 2015 ultrasound right upper quadrant FINDINGS: Gallbladder: No gallstones. Gallbladder wall is borderline thickened. There is no appreciable pericholecystic fluid. There is slight sludge in the gallbladder. No sonographic Murphy sign noted by sonographer. Common bile duct: Diameter: 4 mm. No intrahepatic or extrahepatic biliary duct dilatation. Liver: No focal lesion identified. Within normal limits in parenchymal echogenicity. Portal vein is patent on color Doppler imaging with normal direction of blood flow towards the liver. Other: Right kidney appears somewhat echogenic. IMPRESSION: 1. Slight sludge in gallbladder. Gallbladder wall borderline thickened, less pronounced than on prior study. No gallstones are demonstrable. A degree of acalculus cholecystitis potentially could present in this manner. This finding may warrant  correlation with nuclear medicine hepatobiliary imaging study to assess for cystic duct patency. 2. Echogenic right kidney, a finding that may be indicative of medical renal disease. Appropriate renal function laboratory studies advised. 3.  Study otherwise unremarkable. Electronically Signed   By: Lowella Grip III M.D.   On: 07/29/2019 15:23    Acute neutrophilia #86 year old male patient with a history of anemia/possible MDS; CKD; CHF is currently admitted to hospital for worsening right abdominal pain/concerning for acute cholecystitis-noted to have acute elevated white count-50-60,000  #Acute neutrophilia -50-60,000-the context of acute cholecystitis-is likely reactive; clinically less likely to be malignant.  Recommend peripheral smear; differential; also peripheral blood flow cytometry.  #  Anemia-likely MDS [clinically low-grade-no bone marrow biopsy]-supported by IV iron/erythropoietin stimulating agents.  Again clinically less likely to be evolving into a acute leukemic process at this time.   #Intermittent thrombocytopenia-likely ITP currently 100,000.  Clinically asymptomatic.  #Acute cholecystitis/hypotension-sepsis; on vasopressors.  Awaiting cholecystostomy tube; as per primary team/surgery  Thank you Dr.Aleskerov for allowing me to participate in the care of your pleasant patient. Please do not hesitate to contact me with questions or concerns in the interim.  Discussed with patient's daughter [name] by the bedside.   All questions were answered. The patient knows to call the clinic with any problems, questions or concerns.   Cammie Sickle, MD 07/30/2019 7:24 PM

## 2019-07-30 NOTE — Progress Notes (Signed)
Called and updated pt's daughter Tiana Loft of her father's status, with sepsis due to suspected cholecystitis requiring vasopressors with plan for IR to place percutaneous cholecystostomy tube today.  She is very appreciative of the update and plans to come to bedside this morning.   Darel Hong, AGACNP-BC [facility] Pager: [phone]

## 2019-07-30 NOTE — Progress Notes (Signed)
Pharmacy Antibiotic Note  Andres Gutierrez is a 86 y.o. male admitted on 07/29/2019 with sepsis.  Pharmacy has been consulted for Vancomycin dosing.  Pt has received 2 grams initially  Plan: Vancomycin dosing will be adjusted based on levels, SCr - dosing will likely be every 36 to 48 hrs  Height: 6\' 4"  (193 cm) Weight: 206 lb 5.6 oz (93.6 kg) IBW/kg (Calculated) : 86.8  Temp (24hrs), Avg:99.3 F (37.4 C), Min:97.5 F (36.4 C), Max:103.1 F (39.5 C)  Recent Labs  Lab 07/29/19 0825 07/29/19 1330 07/29/19 1702 07/29/19 1910 07/29/19 2314  WBC 16.7*  --   --   --  54.5*  CREATININE 1.95*  --   --   --  1.95*  LATICACIDVEN  --  1.6 3.2* 3.0* 1.8    Estimated Creatinine Clearance: 33.4 mL/min (A) (by C-G formula based on SCr of 1.95 mg/dL (H)).    Allergies  Allergen Reactions  . Levaquin [Levofloxacin]     [name], her daughter, said Levaquin should be avoided because when patient had Levaquin a few years ago, he had "a bad reaction".  She said he could not walk and could not lift up his feet.  . 5ht3 Receptor Antagonists   . Diphenhydramine Hcl Other (See Comments)    Reaction:  Rash and fever a long time ago, but has had it since with no problem.  . Escitalopram Other (See Comments)    Reaction:  Makes him feel faint, like he was going to have a heart attack.  . Maxidex [Dexamethasone] Other (See Comments)    Reaction:  Unknown   . Prednisone Other (See Comments)    Pt states that this medication made him feel crazy.    . Serotonin Other (See Comments)    Tried 2 different types, lexapro and another one.  Reaction:  Made him feel like he was having a heart attack.   . Vytorin [Ezetimibe-Simvastatin] Other (See Comments)    Reaction:  Unknown   . Zocor [Simvastatin] Other (See Comments)    Reaction:  Unknown   . Diltiazem Rash    Antimicrobials this admission:   >>    >>   Dose adjustments this admission:   Microbiology results:  BCx:   UCx:    Sputum:    MRSA  PCR:   Thank you for allowing pharmacy to be a part of this patient's care.  Hart Robinsons A 07/30/2019 2:07 AM

## 2019-07-30 NOTE — Progress Notes (Signed)
Assumed care from [place], [place].  Patient alert and oriented.  Denies pain.  Vital signs stable.  Drain stable with clean dry and intact draining serosanguinous.

## 2019-07-30 NOTE — Progress Notes (Signed)
Patient taken for procedure by Alison,RN and Tevin, orderly.

## 2019-07-30 NOTE — Assessment & Plan Note (Addendum)
#  86 year old male patient with a history of anemia/possible MDS; CKD; CHF is currently admitted to hospital for worsening right abdominal pain/concerning for acute cholecystitis-noted to have acute elevated white count-50-60,000  #Acute neutrophilia -50-60,000-the context of acute cholecystitis-is likely reactive; clinically less likely to be malignant.  Recommend peripheral smear; differential; also peripheral blood flow cytometry.  #  Anemia-likely MDS [clinically low-grade-no bone marrow biopsy]-supported by IV iron/erythropoietin stimulating agents.  Again clinically less likely to be evolving into a acute leukemic process at this time.   #Intermittent thrombocytopenia-likely ITP currently 100,000.  Clinically asymptomatic.  #Acute cholecystitis/hypotension-sepsis; on vasopressors.  Awaiting cholecystostomy tube; as per primary team/surgery  Thank you Dr.Aleskerov for allowing me to participate in the care of your pleasant patient. Please do not hesitate to contact me with questions or concerns in the interim.  Discussed with patient's daughter [name] by the bedside.

## 2019-07-30 NOTE — Progress Notes (Signed)
Patient noted to have slight increase in heart rate from 90's to 107-115 and increase work of breathing. Patient reported getting strangled while drinking water and had coughing spell.  Heart rate elevated and sustained in the 130's.  Stat EKG obtained and Dr. Lanney Gins at bedside.  One time order obtained for metoprolol 5mg  IV.

## 2019-07-30 NOTE — Progress Notes (Signed)
Pharmacy Antibiotic Note  Andres Gutierrez is a 86 y.o. male admitted on 07/29/2019 with sepsis. Patient to have IR guided percutaneous cholecystostomy. WBC significantly elevated, heme consult for further work up. Pharmacy has been consulted for Vancomycin and Zosyn dosing.   Plan: Patient received vancomycin 2000 mg 3/18 approximately 0100. Based on current renal function, will plan for vanc 1000 mg IV q48h with first dose 3/20. Will reassess renal function with morning labs and discuss antibiotic plan on rounds prior to ordering dose. Regimen provides expected AUC 458 with SCr 1.91.  Continue Zosyn 3.375 g IV q8h extended infusion.  Height: 6\' 4"  (193 cm) Weight: 202 lb 13.2 oz (92 kg) IBW/kg (Calculated) : 86.8  Temp (24hrs), Avg:99.3 F (37.4 C), Min:98.1 F (36.7 C), Max:103.1 F (39.5 C)  Recent Labs  Lab 07/29/19 0825 07/29/19 1330 07/29/19 1702 07/29/19 1910 07/29/19 2314 07/30/19 0224 07/30/19 0942  WBC 16.7*  --   --   --  54.5* 59.8* 46.6*  CREATININE 1.95*  --   --   --  1.95* 1.91*  --   LATICACIDVEN  --  1.6 3.2* 3.0* 1.8 1.8  --     Estimated Creatinine Clearance: 34.1 mL/min (A) (by C-G formula based on SCr of 1.91 mg/dL (H)).    Allergies  Allergen Reactions  . Levaquin [Levofloxacin]     [name], her daughter, said Levaquin should be avoided because when patient had Levaquin a few years ago, he had "a bad reaction".  She said he could not walk and could not lift up his feet.  . 5ht3 Receptor Antagonists   . Diphenhydramine Hcl Other (See Comments)    Reaction:  Rash and fever a long time ago, but has had it since with no problem.  . Escitalopram Other (See Comments)    Reaction:  Makes him feel faint, like he was going to have a heart attack.  . Maxidex [Dexamethasone] Other (See Comments)    Reaction:  Unknown   . Prednisone Other (See Comments)    Pt states that this medication made him feel crazy.    . Serotonin Other (See Comments)    Tried 2 different  types, lexapro and another one.  Reaction:  Made him feel like he was having a heart attack.   . Vytorin [Ezetimibe-Simvastatin] Other (See Comments)    Reaction:  Unknown   . Zocor [Simvastatin] Other (See Comments)    Reaction:  Unknown   . Diltiazem Rash    Antimicrobials this admission: Vancomycin 3/18 >> Zosyn 3/17 >>  Dose adjustments this admission: NA  Microbiology results: 3/17 BCx: NG  3/17 MRSA PCR: negative  Thank you for allowing pharmacy to be a part of this patient's care.  Tawnya Crook, PharmD 07/30/2019 11:20 AM

## 2019-07-31 ENCOUNTER — Other Ambulatory Visit: Payer: Self-pay | Admitting: *Deleted

## 2019-07-31 DIAGNOSIS — D631 Anemia in chronic kidney disease: Secondary | ICD-10-CM

## 2019-07-31 LAB — CBC
HCT: 25.6 % — ABNORMAL LOW (ref 39.0–52.0)
Hemoglobin: 8 g/dL — ABNORMAL LOW (ref 13.0–17.0)
MCH: 33.9 pg (ref 26.0–34.0)
MCHC: 31.3 g/dL (ref 30.0–36.0)
MCV: 108.5 fL — ABNORMAL HIGH (ref 80.0–100.0)
Platelets: 132 10*3/uL — ABNORMAL LOW (ref 150–400)
RBC: 2.36 MIL/uL — ABNORMAL LOW (ref 4.22–5.81)
RDW: 17.6 % — ABNORMAL HIGH (ref 11.5–15.5)
WBC: 20.6 10*3/uL — ABNORMAL HIGH (ref 4.0–10.5)
nRBC: 0 % (ref 0.0–0.2)

## 2019-07-31 LAB — BASIC METABOLIC PANEL
Anion gap: 11 (ref 5–15)
BUN: 32 mg/dL — ABNORMAL HIGH (ref 8–23)
CO2: 21 mmol/L — ABNORMAL LOW (ref 22–32)
Calcium: 7.8 mg/dL — ABNORMAL LOW (ref 8.9–10.3)
Chloride: 104 mmol/L (ref 98–111)
Creatinine, Ser: 1.87 mg/dL — ABNORMAL HIGH (ref 0.61–1.24)
GFR calc Af Amer: 37 mL/min — ABNORMAL LOW (ref >60)
GFR calc non Af Amer: 32 mL/min — ABNORMAL LOW (ref >60)
Glucose, Bld: 108 mg/dL — ABNORMAL HIGH (ref 70–99)
Potassium: 3.8 mmol/L (ref 3.5–5.1)
Sodium: 136 mmol/L (ref 135–145)

## 2019-07-31 LAB — RENAL FUNCTION PANEL
Albumin: 3.1 g/dL — ABNORMAL LOW (ref 3.5–5.0)
Anion gap: 12 (ref 5–15)
BUN: 32 mg/dL — ABNORMAL HIGH (ref 8–23)
CO2: 19 mmol/L — ABNORMAL LOW (ref 22–32)
Calcium: 7.7 mg/dL — ABNORMAL LOW (ref 8.9–10.3)
Chloride: 104 mmol/L (ref 98–111)
Creatinine, Ser: 1.85 mg/dL — ABNORMAL HIGH (ref 0.61–1.24)
GFR calc Af Amer: 37 mL/min — ABNORMAL LOW (ref >60)
GFR calc non Af Amer: 32 mL/min — ABNORMAL LOW (ref >60)
Glucose, Bld: 101 mg/dL — ABNORMAL HIGH (ref 70–99)
Phosphorus: 3.3 mg/dL (ref 2.5–4.6)
Potassium: 3.9 mmol/L (ref 3.5–5.1)
Sodium: 135 mmol/L (ref 135–145)

## 2019-07-31 LAB — PROCALCITONIN: Procalcitonin: 4.99 ng/mL

## 2019-07-31 NOTE — Progress Notes (Signed)
Pharmacy Antibiotic Note  Andres Gutierrez is a 86 y.o. male admitted on 07/29/2019 with cholecystitis s/p PCT cholecystostomy. WBC significantly elevated since admission, although improved over the last 2 days. Pharmacy was consulted for Zosyn dosing. Vancomycin was discontinued this morning to narrow therapy. This is day #3 of IV antibiotics with renal function stable, although SCr is elevated above his baseline of approximately 1.6 mg/dL  Plan:    Continue Zosyn 3.375 g IV q8h extended infusion.  Height: 6\' 4"  (193 cm) Weight: 206 lb 5.6 oz (93.6 kg) IBW/kg (Calculated) : 86.8  Temp (24hrs), Avg:98.4 F (36.9 C), Min:97.8 F (36.6 C), Max:98.9 F (37.2 C)  Recent Labs  Lab 07/29/19 0825 07/29/19 1330 07/29/19 1702 07/29/19 1910 07/29/19 2314 07/30/19 0224 07/30/19 0942 07/31/19 0413  WBC 16.7*  --   --   --  54.5* 59.8* 46.6* 20.6*  CREATININE 1.95*  --   --   --  1.95* 1.91*  --  1.87*  LATICACIDVEN  --  1.6 3.2* 3.0* 1.8 1.8  --   --     Estimated Creatinine Clearance: 34.8 mL/min (A) (by C-G formula based on SCr of 1.87 mg/dL (H)).    Allergies  Allergen Reactions  . Levaquin [Levofloxacin]     [name], her daughter, said Levaquin should be avoided because when patient had Levaquin a few years ago, he had "a bad reaction".  She said he could not walk and could not lift up his feet.  . 5ht3 Receptor Antagonists   . Diphenhydramine Hcl Other (See Comments)    Reaction:  Rash and fever a long time ago, but has had it since with no problem.  . Escitalopram Other (See Comments)    Reaction:  Makes him feel faint, like he was going to have a heart attack.  . Maxidex [Dexamethasone] Other (See Comments)    Reaction:  Unknown   . Prednisone Other (See Comments)    Pt states that this medication made him feel crazy.    . Serotonin Other (See Comments)    Tried 2 different types, lexapro and another one.  Reaction:  Made him feel like he was having a heart attack.   . Vytorin  [Ezetimibe-Simvastatin] Other (See Comments)    Reaction:  Unknown   . Zocor [Simvastatin] Other (See Comments)    Reaction:  Unknown   . Diltiazem Rash    Antimicrobials this admission: Vancomycin 3/18 >> 3/19 Zosyn 3/17 >>  Microbiology results: 3/17 BCx: NGTD  3/17 MRSA PCR: negative 3/17 SARS CoV-2: negative 3/17 influenza A/B: negative 3/18 Bile Cx: gram variable rods, GPR  Thank you for allowing pharmacy to be a part of this patient's care.  Dallie Piles, PharmD 07/31/2019 6:58 AM

## 2019-07-31 NOTE — Progress Notes (Signed)
Name: Andres Gutierrez MRN: [id number] DOB: 04-Dec-1930    ADMISSION DATE:  07/29/2019 CONSULTATION DATE:  07/29/2019  REFERRING MD :  Andres Falco, NP  CHIEF COMPLAINT:  Abdominal pain, Nausea, vomiting  BRIEF PATIENT DESCRIPTION:  86 y.o. Male with PMH notable for HFrEF (EF 20-25% in Jan 2021), HTN, CABG, and cystic fibrosis admitted 07/29/19 due to severe sepsis with septic shock of unknown etiology.  Suspected source is acalculous cholecystitis vs. Atypical Pneumonia. Possibility of Cardiogenic shock as well (EF 20-25% in Jan 2021).  SIGNIFICANT EVENTS  3/17: Presented to ED, to be admitted to Telemetry 3/17: Became hypotensive which was unresponsive to IVF resuscitation 3/17: Admit to ICU, PCCM consulted 3/18 - met with daughter to discuss care plan, patient clinically imrpoved with resolution of abd pain.  Plan for per chole tube.  Oncology eval today.  3/19- patient further imporved, he did work with PT today, did fair  STUDIES:  3/17: CT Abdomen & Pelvis>> 1. No acute abnormalities. 2. Stable nonobstructing calculus in the distal left ureter. 3. Nonobstructing calculus in the lower pole the right kidney is similar to the previous exam. 4. Bilateral renal cysts. 5. Coronary artery calcification. 6. Chronic right-sided pleural thickening, partially imaged may relate to prior trauma or infection. 3/17: RUQ Abdominal US>>1. Slight sludge in gallbladder. Gallbladder wall borderline thickened, less pronounced than on prior study. No gallstones are demonstrable. A degree of acalculus cholecystitis potentially could present in this manner. This finding may warrant correlation with nuclear medicine hepatobiliary imaging study to assess for cystic duct patency. 2. Echogenic right kidney, a finding that may be indicative of medical renal disease. Appropriate renal function laboratory studies advised. 3.  Study otherwise unremarkable. 3/17: CXR>> There are scattered pulmonary  opacities bilaterally. There is no pneumothorax. There may be small bilateral pleural effusions. The heart size remains enlarged. The patient is status post prior median Sternotomy. 3/17: HIDA Scan>>Nonvisualization of the gallbladder despite morphine administration, cystic duct patency cannot be established and acute cholecystitis therefore cannot be excluded. Note that false positive examinations can occur in the setting of prolonged fasting, chronic cholecystitis, and hepatocellular dysfunction.  CULTURES: SARS-CoV-2 PCR 3/17>> negative Influenza PCR 3/17>> negative MRSA PCR 3/17>> negative Blood culture x2 3/17>> Sputum 3/17>> Strep pneumo urinary antigen 3/17>> Legionella urinary antigen 3/17>>  ANTIBIOTICS: Cefepime x1 dose in ED 3/17 Flagyl x1 dose in ED 3/17>> Zosyn 3/17>> Vancomycin 3/18>>  HISTORY OF PRESENT ILLNESS:   Andres Gutierrez is a 86 year old male with a past medical history notable for HFrEF (EF 20-25% in Jan 2021), CABG, chronic kidney disease stage III, anemia, anxiety, hypertension who presented to [facility] ED on 07/29/2019 due to abdominal pain.  Per his daughter's report, he again complaining of severe epigastric pain earlier this morning.  He denied chest pain, shortness of breath, diarrhea, dysuria.  He did have an episode of vomiting in the ED.     Upon presentation to the ED he was noted to be febrile with temperature 103.1, tachycardic, but normotensive.  Initial work-up revealed WBC 16.7, procalcitonin less than 0.1, lactic acid 1.6, hemoglobin 9.3, BUN 30, creatinine 1.95.  His COVID-19 PCR and influenza PCR both negative.  CT abdomen and pelvis was obtained which was negative for any acute intra-abdominal abnormality, did reveal a nonobstructing stone in the left distal ureter, and an nonobstructing stone in the lower pole of the right kidney.  Abdominal ultrasound showed sludge in the gallbladder, however no stones, concerning for possible acalculous cholecystitis.   HIDA  scan was obtained which was unable to visualize cystic duct patency and unable to exclude acute cholecystitis.  Chest x-ray with scattered bilateral pulmonary opacities concerning for pulmonary edema versus atypical infectious process.  He met sepsis criteria therefore he received IV fluid resuscitation and received IV cefepime and Flagyl.  General surgery is following along.  Despite IV fluid resuscitation he remained hypotensive requiring Levophed infusion.  He is admitted to the ICU by the hospitalist.    PCCM is consulted for further work-up and treatment of severe sepsis with septic shock secondary to questionable acalculous cholecystitis versus questionable atypical pneumonia.  Questionable component of cardiogenic shock given his history of HFrEF as well.  PAST MEDICAL HISTORY :   has a past medical history of Anemia, Anxiety, Cardiomyopathy (Andres Gutierrez), CHF (congestive heart failure) (Andres Gutierrez), Chronic kidney disease, Coronary artery disease, Cough, Hypertension, Lymphadenopathy, hilar, MI (myocardial infarction) (Andres Gutierrez), and Wheezing.  has a past surgical history that includes Coronary artery bypass graft; Coronary angioplasty with stent; Endobronchial ultrasound (N/A, 12/31/2014); Bronchial needle aspiration biopsy (N/A, 12/31/2014); Esophagogastroduodenoscopy (egd) with propofol (N/A, 06/20/2015); Cardiac catheterization (N/A, 12/28/2015); and Cataract extraction w/PHACO (Left, 08/01/2016). Prior to Admission medications   Medication Sig Start Date End Date Taking? Authorizing Provider  aspirin EC 81 MG EC tablet Take 1 tablet (81 mg total) by mouth daily. 06/10/19  Yes Andres Gutierrez, Andres Larry, MD  atorvastatin (LIPITOR) 40 MG tablet Take 1 tablet (40 mg total) by mouth daily at 6 PM. 12/30/15  Yes Andres Flock, MD  digoxin (LANOXIN) 0.125 MG tablet Take 0.0625 mg by mouth daily.   Yes [provider]  famotidine (PEPCID) 20 MG tablet Take 20 mg by mouth at bedtime. 02/04/19  Yes [provider]  ferrous sulfate 325 (65 FE) MG tablet Take 1 tablet (325 mg total) by mouth every other day. Patient taking differently: Take 325 mg by mouth every Monday, Wednesday, and Friday.  03/04/19  Yes Verlon Au, NP  furosemide (LASIX) 40 MG tablet Take 1 tablet (40 mg total) by mouth daily. Patient taking differently: Take 20 mg by mouth daily.  06/10/19  Yes Andres Gutierrez, Andres Larry, MD  metoprolol tartrate (LOPRESSOR) 25 MG tablet Take 0.5 tablets (12.5 mg total) by mouth 2 (two) times daily. 01/09/19  Yes Gouru, Illene Silver, MD  ranolazine (RANEXA) 1000 MG SR tablet Take 500 mg by mouth 2 (two) times daily.  01/16/19  Yes [provider]  sucralfate (CARAFATE) 1 G tablet Take 1 g by mouth 2 (two) times daily.    Yes [provider]  tamsulosin (FLOMAX) 0.4 MG CAPS capsule Take 0.4 mg by mouth daily after supper.    Yes [provider]  vitamin B-12 (CYANOCOBALAMIN) 500 MCG tablet Take 500 mcg by mouth daily.   Yes [provider]  vitamin C (VITAMIN C) 250 MG tablet Take 1 tablet (250 mg total) by mouth daily. 11/01/15  Yes Andres Flock, MD  acetaminophen (TYLENOL) 500 MG tablet Take 500-1,000 mg by mouth every 6 (six) hours as needed for mild pain, moderate pain or fever.     [provider]  docusate sodium (COLACE) 100 MG capsule Take 100 mg by mouth daily as needed for mild constipation.     [provider]  nitroGLYCERIN (NITROSTAT) 0.4 MG SL tablet Place 0.4 mg under the tongue every 5 (five) minutes as needed for chest pain.    [provider]  omeprazole (PRILOSEC) 20 MG capsule Take 1 capsule (20 mg total) by mouth  daily. 07/29/19 07/28/20  Blake Divine, MD  polyethylene glycol (MIRALAX / GLYCOLAX) 17 g packet Take 17 g by mouth daily as needed for mild constipation. 01/09/19   Nicholes Mango, MD   Allergies  Allergen Reactions  . Levaquin [Levofloxacin]     [name], her daughter, said Levaquin should be avoided because  when patient had Levaquin a few years ago, he had "a bad reaction".  She said he could not walk and could not lift up his feet.  . 5ht3 Receptor Antagonists   . Diphenhydramine Hcl Other (See Comments)    Reaction:  Rash and fever a long time ago, but has had it since with no problem.  . Escitalopram Other (See Comments)    Reaction:  Makes him feel faint, like he was going to have a heart attack.  . Maxidex [Dexamethasone] Other (See Comments)    Reaction:  Unknown   . Prednisone Other (See Comments)    Pt states that this medication made him feel crazy.    . Serotonin Other (See Comments)    Tried 2 different types, lexapro and another one.  Reaction:  Made him feel like he was having a heart attack.   . Vytorin [Ezetimibe-Simvastatin] Other (See Comments)    Reaction:  Unknown   . Zocor [Simvastatin] Other (See Comments)    Reaction:  Unknown   . Diltiazem Rash    FAMILY HISTORY:  family history includes CAD in his mother; Colon cancer in his father. SOCIAL HISTORY:  reports that he quit smoking about 42 years ago. His smokeless tobacco use includes chew. He reports current alcohol use. He reports that he does not use drugs.     REVIEW OF SYSTEMS:  Positives in BOLD Constitutional: Negative for fever, chills, weight loss, malaise/fatigue and diaphoresis.  HENT: Negative for hearing loss, ear pain, nosebleeds, congestion, sore throat, neck pain, tinnitus and ear discharge.   Eyes: Negative for blurred vision, double vision, photophobia, pain, discharge and redness.  Respiratory: Negative for cough, hemoptysis, sputum production, shortness of breath, wheezing and stridor.   Cardiovascular: Negative for chest pain, palpitations, orthopnea, claudication, leg swelling and PND.  Gastrointestinal: Negative for heartburn, nausea, vomiting, , diarrhea, constipation, blood in stool and melena.  Genitourinary: Negative for dysuria, urgency, frequency, hematuria and flank pain.    Musculoskeletal: Negative for myalgias, back pain, joint pain and falls.  Skin: Negative for itching and rash.  Neurological: Negative for dizziness, tingling, tremors, sensory change, speech change, focal weakness, seizures, loss of consciousness, weakness and headaches.  Endo/Heme/Allergies: Negative for environmental allergies and polydipsia. Does not bruise/bleed easily.  SUBJECTIVE:  Pt reports abdominal pain which has greatly improved Denies chest pain, SOB, N/V, diarrhea, dysuria, fever/chills On 6 mcg Levophed infusion  VITAL SIGNS: Temp:  [97.8 F (36.6 C)-98.9 F (37.2 C)] 98 F (36.7 C) (03/19 0800) Pulse Rate:  [75-109] 82 (03/19 0900) Resp:  [13-31] 26 (03/19 0900) BP: (88-135)/(35-99) 117/55 (03/19 0900) SpO2:  [91 %-100 %] 97 % (03/19 0900) Weight:  [93.6 kg] 93.6 kg (03/19 0321)  PHYSICAL EXAMINATION: General:  Acutely ill appearing male, sitting in bed, on nasal cannula, in NAD Neuro:  Awake, A&O x4, speech clear, follow commands, no focal deficits, pupils PERRL HEENT:  Atraumatic, normocephalic, neck supple, no JVD Cardiovascular: Regular rate and rhythm, S1-S2, no murmurs, rubs, gallops, 1+ pulses Lungs: Clear to auscultation bilaterally, even, nonlabored, normal effort Abdomen: Obese, soft, mildly tender to palpation, no distention, no guarding or rebound tenderness, bowel sounds positive  x4 Musculoskeletal: Appropriate bulk and tone given age, no deformities, no edema Skin: Warm and dry, no obvious rashes, lesions, or ulcerations  Recent Labs  Lab 07/29/19 2314 07/30/19 0224 07/31/19 0413  NA 138 138 136  K 3.9 4.3 3.8  CL 106 105 104  CO2 24 24 21*  BUN 27* 28* 32*  CREATININE 1.95* 1.91* 1.87*  GLUCOSE 108* 111* 108*   Recent Labs  Lab 07/30/19 0224 07/30/19 0942 07/31/19 0413  HGB 8.6* 8.6* 8.0*  HCT 27.0* 27.3* 25.6*  WBC 59.8* 46.6* 20.6*  PLT 143* 122* 132*   NM Hepatobiliary Liver Func  Result Date: 07/29/2019 CLINICAL DATA:   Abnormal ultrasound EXAM: NUCLEAR MEDICINE HEPATOBILIARY IMAGING TECHNIQUE: Sequential images of the abdomen were obtained out to 60 minutes following intravenous administration of radiopharmaceutical. 3.5 mg of morphine given intravenously and additional imaging over 30 minutes was performed. RADIOPHARMACEUTICALS:  5.72 mCi Tc-105m Choletec IV COMPARISON:  Ultrasound 07/29/2019, CT 08/08/2019, 03/26/2019 FINDINGS: Prompt uptake and biliary excretion of activity by the liver is seen. No gallbladder activity after 60 minutes. Normal excretion of radiotracer into the small bowel. Following morphine administration, gallbladder activity remains absent. IMPRESSION: Nonvisualization of the gallbladder despite morphine administration, cystic duct patency cannot be established and acute cholecystitis therefore cannot be excluded. Note that false positive examinations can occur in the setting of prolonged fasting, chronic cholecystitis, and hepatocellular dysfunction. Electronically Signed   By: KDonavan FoilM.D.   On: 07/29/2019 23:16   CT Abdomen Pelvis W Contrast  Result Date: 07/29/2019 CLINICAL DATA:  Abdominal pain, acute abdominal pain with difficulty urinating. EXAM: CT ABDOMEN AND PELVIS WITH CONTRAST TECHNIQUE: Multidetector CT imaging of the abdomen and pelvis was performed using the standard protocol following bolus administration of intravenous contrast. CONTRAST:  713mOMNIPAQUE IOHEXOL 300 MG/ML  SOLN COMPARISON:  03/26/2019 FINDINGS: Lower chest: Chronic right-sided pleural thickening has been present since 2017. No dense consolidation or pleural effusion. Signs of coronary artery calcification. No pericardial fluid. Hepatobiliary: Liver and biliary tree with normal appearance. Portal vein is patent. Pancreas: Pancreas is normal without signs of ductal dilation or peripancreatic inflammation. Spleen: Spleen is normal size without focal lesion. Adrenals/Urinary Tract: Adrenal glands are normal.  Low-density lesion in the interpolar aspect of the left kidney shows water density measuring 2.2 x 2.3 cm, unchanged dating back to 2018. Renal cortical scarring bilaterally. Low-density lesion in the right kidney also unchanged. This is in the lower pole. A smaller upper pole presumed cyst is also stable. Nephrolithiasis with 3-4 mm calculus in the lower pole of the right kidney. Tiny calculus in the distal left ureter is unchanged and is not associated with hydronephrosis or Peri ureteral stranding. No right ureteral calculi. Urinary bladder is unremarkable. Stomach/Bowel: The gastrointestinal tract shows no acute findings. Changes of appendectomy in the right lower quadrant. Small hiatal hernia. Stool fills much of the colon. Vascular/Lymphatic: Calcified and noncalcified plaque in the abdominal aorta no aneurysm. No adenopathy in the upper abdomen or retroperitoneum. No pelvic lymphadenopathy. Reproductive: Prostate is normal by CT. Other: No abdominal wall hernia or abnormality. No abdominopelvic ascites. Musculoskeletal: Spinal degenerative changes, no acute or destructive bone finding. IMPRESSION: 1. No acute abnormalities. 2. Stable nonobstructing calculus in the distal left ureter. 3. Nonobstructing calculus in the lower pole the right kidney is similar to the previous exam. 4. Bilateral renal cysts. 5. Coronary artery calcification. 6. Chronic right-sided pleural thickening, partially imaged may relate to prior trauma or infection. Aortic Atherosclerosis (ICD10-I70.0). Electronically  Signed   By: Zetta Bills M.D.   On: 07/29/2019 10:38   DG Chest Port 1 View  Result Date: 07/30/2019 CLINICAL DATA:  Sepsis EXAM: PORTABLE CHEST 1 VIEW COMPARISON:  Radiograph 07/29/2019, CT 04/27/2019 FINDINGS: Redemonstration of the hazy interstitial opacities throughout both lungs which appear more focally confluent in the left lung base. There is obscuration of the left hemidiaphragm which may reflect some layering  left effusion. No pneumothorax. Cardiomegaly is similar to prior. There are postsurgical changes from prior CABG. Fracture of the second and third most superior wires is similar to comparison exam. Telemetry leads overlie the chest. IMPRESSION: Favor features of pulmonary edema with cardiomegaly and left effusion. Given the concern for sepsis, a more patchy opacity in left base could reflect infection. Electronically Signed   By: Lovena Le M.D.   On: 07/30/2019 03:36   DG Chest Port 1 View  Result Date: 07/29/2019 CLINICAL DATA:  Fever. EXAM: PORTABLE CHEST 1 VIEW COMPARISON:  June 07, 2019 FINDINGS: There are scattered pulmonary opacities bilaterally. There is no pneumothorax. There may be small bilateral pleural effusions. The heart size remains enlarged. The patient is status post prior median sternotomy. IMPRESSION: Cardiomegaly with probable vascular congestion and pulmonary edema. An underlying atypical infectious process cannot be excluded. Electronically Signed   By: Constance Holster M.D.   On: 07/29/2019 18:39   ECHOCARDIOGRAM COMPLETE  Result Date: 07/30/2019    ECHOCARDIOGRAM REPORT   Patient Name:   STALEY LUNZ Date of Exam: 07/30/2019 Medical Rec #:  [id number]    Height:       76.0 in Accession #:    [id number]   Weight:       202.8 lb Date of Birth:  [birth year]    BSA:          2.228 m Patient Age:    42 years     BP:           116/49 mmHg Patient Gender: M            HR:           77 bpm. Exam Location:  [facility] Procedure: 2D Echo, Cardiac Doppler and Color Doppler Indications:     CHF- acute systolic 371.06  History:         Patient has prior history of Echocardiogram examinations, most                  recent 06/08/2019. CHF, CAD; Risk Factors:Hypertension.                  Cardiomyopathy, MI.  Sonographer:     [name] RDCS (AE) Referring Phys:  [id number] Bradly Bienenstock Diagnosing Phys: Yolonda Kida MD IMPRESSIONS  1. Left ventricular ejection fraction, by estimation, is 25 to  30%. The left ventricle has severely decreased function. The left ventricle demonstrates global hypokinesis. The left ventricular internal cavity size was moderately to severely dilated. Left ventricular diastolic parameters are consistent with Grade I diastolic dysfunction (impaired relaxation).  2. Right ventricular systolic function is moderately reduced. The right ventricular size is moderately enlarged. There is normal pulmonary artery systolic pressure.  3. Left atrial size was moderately dilated.  4. Right atrial size was moderately dilated.  5. The mitral valve is grossly normal. Mild mitral valve regurgitation.  6. Tricuspid valve regurgitation is mild to moderate.  7. The aortic valve is grossly normal. Aortic valve regurgitation is not visualized. FINDINGS  Left Ventricle: Left ventricular  ejection fraction, by estimation, is 25 to 30%. The left ventricle has severely decreased function. The left ventricle demonstrates global hypokinesis. The left ventricular internal cavity size was moderately to severely  dilated. There is no left ventricular hypertrophy. Left ventricular diastolic parameters are consistent with Grade I diastolic dysfunction (impaired relaxation). Right Ventricle: The right ventricular size is moderately enlarged. No increase in right ventricular wall thickness. Right ventricular systolic function is moderately reduced. There is normal pulmonary artery systolic pressure. The tricuspid regurgitant velocity is 1.78 m/s, and with an assumed right atrial pressure of 10 mmHg, the estimated right ventricular systolic pressure is 67.5 mmHg. Left Atrium: Left atrial size was moderately dilated. Right Atrium: Right atrial size was moderately dilated. Pericardium: There is no evidence of pericardial effusion. Mitral Valve: The mitral valve is grossly normal. Mild mitral valve regurgitation. Tricuspid Valve: The tricuspid valve is grossly normal. Tricuspid valve regurgitation is mild to moderate.  Aortic Valve: The aortic valve is grossly normal. Aortic valve regurgitation is not visualized. Aortic valve mean gradient measures 5.7 mmHg. Aortic valve peak gradient measures 8.1 mmHg. Aortic valve area, by VTI measures 2.31 cm. Pulmonic Valve: The pulmonic valve was normal in structure. Pulmonic valve regurgitation is trivial. Aorta: The aortic root is normal in size and structure. IAS/Shunts: No atrial level shunt detected by color flow Doppler.  LEFT VENTRICLE PLAX 2D LVIDd:         5.53 cm  Diastology LVIDs:         3.93 cm  LV e' lateral:   5.22 cm/s LV PW:         1.24 cm  LV E/e' lateral: 20.3 LV IVS:        1.09 cm  LV e' medial:    2.94 cm/s LVOT diam:     2.10 cm  LV E/e' medial:  36.1 LV SV:         58 LV SV Index:   26 LVOT Area:     3.46 cm  RIGHT VENTRICLE RV Basal diam:  3.41 cm TAPSE (M-mode): 3.4 cm LEFT ATRIUM              Index       RIGHT ATRIUM           Index LA diam:        5.50 cm  2.47 cm/m  RA Area:     26.60 cm LA Vol (A2C):   119.0 ml 53.40 ml/m RA Volume:   82.00 ml  36.80 ml/m LA Vol (A4C):   92.8 ml  41.64 ml/m LA Biplane Vol: 106.0 ml 47.57 ml/m  AORTIC VALVE                    PULMONIC VALVE AV Area (Vmax):    2.25 cm     PV Vmax:        1.13 m/s AV Area (Vmean):   2.07 cm     PV Peak grad:   5.1 mmHg AV Area (VTI):     2.31 cm     RVOT Peak grad: 8 mmHg AV Vmax:           142.33 cm/s AV Vmean:          105.367 cm/s AV VTI:            0.252 m AV Peak Grad:      8.1 mmHg AV Mean Grad:      5.7 mmHg LVOT Vmax:  92.40 cm/s LVOT Vmean:        63.100 cm/s LVOT VTI:          0.168 m LVOT/AV VTI ratio: 0.67  AORTA Ao Root diam: 3.50 cm MITRAL VALVE                TRICUSPID VALVE MV Area (PHT): 5.02 cm     TR Peak grad:   12.7 mmHg MV Decel Time: 151 msec     TR Vmax:        178.00 cm/s MV E velocity: 106.00 cm/s MV A velocity: 85.40 cm/s   SHUNTS MV E/A ratio:  1.24         Systemic VTI:  0.17 m                             Systemic Diam: 2.10 cm Yolonda Kida MD  Electronically signed by Yolonda Kida MD Signature Date/Time: 07/30/2019/10:27:39 AM    Final    CT PERC CHOLECYSTOSTOMY  Result Date: 07/30/2019 INDICATION: Possible cholecystitis. EXAM: PERCUTANEOUS CHOLECYSTOSTOMY. MEDICATIONS: Administered inpatient medications. 5000 unit dosage of heparin discontinued this morning. ANESTHESIA/SEDATION: Moderate (conscious) sedation was employed during this procedure. A total of Versed 1.0 mg and Fentanyl 75 mcg was administered intravenously. Moderate Sedation Time: 25 minutes. The patient's level of consciousness and vital signs were monitored continuously by radiology nursing throughout the procedure under my direct supervision. FLUOROSCOPY TIME:  Fluoroscopy Time: 0 minutes 0 seconds COMPLICATIONS: None immediate. PROCEDURE: After obtaining informed consent. Patient was sterilely prepped and draped. Gallbladder was located with CT guidance. Patient sterilely prepped and draped. Following local anesthesia with 1% lidocaine and IV conscious sedation an 18 gauge needle was advanced into the gallbladder under CT guidance. A Amplatz 035 wire was placed. An 8 French pigtail drainage catheter was placed. Confirmation of a good catheter placement was made with CT. 20 cc of slightly opaque bile was removed and sent to microbiology for further evaluation. The catheter was sutured into place and placed to bag drainage. There were no complications. IMPRESSION: Successful percutaneous cholecystostomy. Electronically Signed   By: Marcello Moores  Register   On: 07/30/2019 15:15   US Abdomen Limited RUQ  Result Date: 07/29/2019 CLINICAL DATA:  Right upper quadrant pain EXAM: ULTRASOUND ABDOMEN LIMITED RIGHT UPPER QUADRANT COMPARISON:  October 28, 2015 ultrasound right upper quadrant FINDINGS: Gallbladder: No gallstones. Gallbladder wall is borderline thickened. There is no appreciable pericholecystic fluid. There is slight sludge in the gallbladder. No sonographic Murphy sign noted by  sonographer. Common bile duct: Diameter: 4 mm. No intrahepatic or extrahepatic biliary duct dilatation. Liver: No focal lesion identified. Within normal limits in parenchymal echogenicity. Portal vein is patent on color Doppler imaging with normal direction of blood flow towards the liver. Other: Right kidney appears somewhat echogenic. IMPRESSION: 1. Slight sludge in gallbladder. Gallbladder wall borderline thickened, less pronounced than on prior study. No gallstones are demonstrable. A degree of acalculus cholecystitis potentially could present in this manner. This finding may warrant correlation with nuclear medicine hepatobiliary imaging study to assess for cystic duct patency. 2. Echogenic right kidney, a finding that may be indicative of medical renal disease. Appropriate renal function laboratory studies advised. 3.  Study otherwise unremarkable. Electronically Signed   By: Lowella Grip III M.D.   On: 07/29/2019 15:23      ASSESSMENT / PLAN:   Circulatory shock likely distributive due to sepsis -possible sepsis related - present on admission -  etiology - acute acalculous cholecysitis -surgery consultation -IV zosyn/vancomycin -Levophed - wean down as possible  -IVF - gentle 1/2Liter LR due to severe systolic dysfunction -PT/OT when possible -random cortisol -procalcitonin >7  and Lactate >3 pointing toward infectious etiology of shock -HIDA scan unable to establish patency of cystic duct, therefor acute cholecystitis unable to be excluded  Acute on chronic systolic CHF with EF 17-61% -patient without peripheral but does have moderate pulmonary edema -gentle diuresis when BP improves and off vasopressors Mildly elevated troponin, likely demand ischemia -Continuous cardiac monitoring -Maintain MAP greater than 65 -lactic acid>3 - cortisol in process - BNP-1067   Chronic kidney disease stage III (currently stable) -Monitor I&O's / urinary output -Follow BMP -Ensure adequate  renal perfusion -Avoid nephrotoxic agents as able -Replace electrolytes as indicated -Cautious IV fluids given CHF  Anemia of chronic disease  - CKD and MDS -heme/onc consult - appreciate eval and recommendations -Monitor for S/Sx of bleeding -Trend CBC -SQ Heparin for VTE Prophylaxis  -Transfuse for Hgb <7  Severe leukocytosis  - possible due to sepsis however typically not this elevated , but would like to get input from onc regarding this due to MDS history     Chronic cystic lung disease - bilateral small thin walled asymetrically distributed small and large sized cysts - largest >4CM LUL -consisted with LIP - seen in autoimmune disease such as Sjogrens and chronic immunodeficient state -no need for additianal testing or treatment of this lung disease due to likey insignificant clincal impact    Pt's prognosis is guarded and is at high risk of cardiac arrest and death.  DISPOSITION: ICU GOALS OF CARE: Full Code VTE PROPHYLAXIS: SQ Heparin STRESS ULCER PROPHYLAXIS: Pepcid UPDATES: Updated pt at bedside 07/29/19 CONSULTS: Hospitalist, General Surgery  Critical care provider statement:    Critical care time (minutes):  33   Critical care time was exclusive of:  Separately billable procedures and  treating other patients   Critical care was necessary to treat or prevent imminent or  life-threatening deterioration of the following conditions:  sepsis, MDS, severe leukocytosis, shock, CKD, CHF, pulmonary edema, anemia, abd pain, cholecystitis, multiple comorbid conditions   Critical care was time spent personally by me on the following  activities:  Development of treatment plan with patient or surrogate,  discussions with consultants, evaluation of patient's response to  treatment, examination of patient, obtaining history from patient or  surrogate, ordering and performing treatments and interventions, ordering  and review of laboratory studies and re-evaluation of  patient's condition   I assumed direction of critical care for this patient from another  provider in my specialty: no      Ottie Glazier, M.D.  [facility] [place]

## 2019-07-31 NOTE — Evaluation (Signed)
Occupational Therapy Evaluation Patient Details Name: Andres Gutierrez MRN: [id number] DOB: November 27, 1930 Today's Date: 07/31/2019    History of Present Illness Andres Gutierrez is an 54yoM who comes to [facility] with abdominal pain, noted to have cholecystits. PMH: chronic systolic CHF with EF of 20 to 25%, ED status post CABG, hypertension, cystic fibrosis lung disease.   Clinical Impression   Patient sitting up in recliner upon entry and agreeable to therapy session.  Patient previously living alone and MOD I with all (B)ADLs.  Children provide intermittent assistance with homemaking tasks; per pt he has not driven in 2 months per son's request.  Patient reports he has no pain at time of evaluation.  Noted 02 went to 80% during gathering of PLOF, but able to rebound quickly with education on PLB.  Patient demonstrated good UB strength and can perform sit<>stand with CGA.  CGA-MIN A needed to maintain balance during static, prolonged standing in order to perform ADLs (e.g. LB dressing, toileting hygiene).  Patient performs SPT using RW with CGA and cues on safety regarding leads.  BP noted at 111/59.  No complaints of dizziness or feeling lightheaded.  HR changed from 88-130 during functional activities and patient state he is unable to feel any changes when it does.  The patient would benefit from further occupational therapy services to address performance component deficits outlined below.  Based on today's performance, recommending [place] OT upon d/c.    Follow Up Recommendations  Home health OT;Supervision - Intermittent    Equipment Recommendations  None recommended by OT    Recommendations for Other Services       Precautions / Restrictions Precautions Precautions: Fall Precaution Comments: Monitor HR with activity Restrictions Weight Bearing Restrictions: No      Mobility Bed Mobility              General bed mobility comments: Patient seated in recliner upon entry  Transfers Overall  transfer level: Needs assistance Equipment used: Rolling walker (2 wheeled) Transfers: Sit to/from [facility] Sit to Stand: Min guard Stand pivot transfers: Min guard       General transfer comment: Requires cues for leads and posture    Balance                                          ADL either performed or assessed with clinical judgement   ADL Overall ADL's : Needs assistance/impaired     Grooming: Wash/dry hands;Wash/dry face;Sitting;Set up               Lower Body Dressing: Minimal assistance;Sit to/from stand;Cueing for sequencing;Cueing for compensatory techniques   Toilet Transfer: Min guard;Minimal assistance;RW;Stand-pivot   Toileting- Clothing Manipulation and Hygiene: Min guard;Sitting/lateral lean       Functional mobility during ADLs: Min guard;Rolling walker General ADL Comments: Patient demonstrates good strength, but deficits in activity tolerance and general endurance.  Patient unable to note when HR changes     Vision Patient Visual Report: No change from baseline       Perception     Praxis      Pertinent Vitals/Pain Pain Assessment: No/denies pain     Hand Dominance Right   Extremity/Trunk Assessment Upper Extremity Assessment Upper Extremity Assessment: Overall WFL for tasks assessed   Lower Extremity Assessment Lower Extremity Assessment: Defer to PT evaluation   Cervical / Trunk Assessment Cervical / Trunk  Assessment: Normal   Communication Communication Communication: HOH   Cognition Arousal/Alertness: Awake/alert Behavior During Therapy: WFL for tasks assessed/performed Overall Cognitive Status: Within Functional Limits for tasks assessed                                 General Comments: Pleasant and cooperative   General Comments  Patient is somewhat unstead in static standing when performing functional tasks (reaching in and out of BOS), but only required CGA for  safety this date    Exercises Other Exercises Other Exercises: Provided education on role and goals of OT in acute care Other Exercises: Provided education on body mechanics, sequencing and hand placement during functional transfers with use of RW Other Exercises: Performed sit to stand transfer with prolonged static standing to assess ability to safely perform ADLs, requiring CGA Other Exercises: Performed SPT using RW with CGA and cues for safety regarding leads   Shoulder Instructions      Home Living Family/patient expects to be discharged to:: Private residence Living Arrangements: Alone Available Help at Discharge: Family(Daughter and son help with community tasks and driving) Type of Home: House Home Access: Stairs to enter Technical brewer of Steps: 2 Entrance Stairs-Rails: Left Home Layout: One level     Bathroom Shower/Tub: Walk-in shower         Home Equipment: Environmental consultant - 2 wheels;Bedside commode;Shower seat;Cane - single point;Grab bars - tub/shower          Prior Functioning/Environment Level of Independence: Needs assistance  Gait / Transfers Assistance Needed: Pt reports using RW primarily for functional mobility ADL's / Homemaking Assistance Needed: I with (B)ADLs.  Children assist with all community tasks.  Pt reports he hasn't driven for the last 2 months.            OT Problem List: Decreased strength;Decreased activity tolerance;Decreased safety awareness      OT Treatment/Interventions: Self-care/ADL training;Therapeutic exercise;Energy conservation;DME and/or AE instruction;Therapeutic activities;Patient/family education    OT Goals(Current goals can be found in the care plan section) Acute Rehab OT Goals Patient Stated Goal: "I want to feel stronger again" OT Goal Formulation: With patient Time For Goal Achievement: 08/14/19 Potential to Achieve Goals: Good  OT Frequency: Min 1X/week   Barriers to D/C:            Co-evaluation               AM-PAC OT "6 Clicks" Daily Activity     Outcome Measure Help from another person eating meals?: None Help from another person taking care of personal grooming?: None Help from another person toileting, which includes using toliet, bedpan, or urinal?: A Little Help from another person bathing (including washing, rinsing, drying)?: A Lot Help from another person to put on and taking off regular upper body clothing?: A Little Help from another person to put on and taking off regular lower body clothing?: A Little 6 Click Score: 19   End of Session Equipment Utilized During Treatment: Gait belt;Rolling walker Nurse Communication: Other (comment)(Spoke with nurse prior to tx to clear safety.  Spoke with MD regarding participation in evaluation.)  Activity Tolerance: Patient tolerated treatment well Patient left: in chair;with call bell/phone within reach  OT Visit Diagnosis: Unsteadiness on feet (R26.81);Muscle weakness (generalized) (M62.81)                Time: 4496-7591 OT Time Calculation (min): 21 min Charges:  OT General Charges $  OT Visit: 1 Visit OT Evaluation $OT Eval Low Complexity: 1 Low OT Treatments $Therapeutic Activity: 8-22 mins  Baldomero Lamy, MS, OTR/L 07/31/19, 4:47 PM

## 2019-07-31 NOTE — Evaluation (Signed)
Physical Therapy Evaluation Patient Details Name: Andres Gutierrez MRN: [id number] DOB: 12-23-1930 Today's Date: 07/31/2019   History of Present Illness  Andres Gutierrez is an 27yoM who comes to [facility] with abdominal pain, noted to have cholecystits. PMH: chronic systolic CHF with EF of 20 to 25%, ED status post CABG, hypertension, cystic fibrosis lung disease.  Clinical Impression  Pt admitted with above diagnosis. Pt currently with functional limitations due to the deficits listed below (see "PT Problem List"). Upon entry, pt in bed, awake and agreeable to participate. The pt is alert and oriented x4, pleasant, conversational, and generally a good historian. Supervision for bed mobility and transfers with RW, MinGuard assist for AMB, mostly limited by HR and rhythm changes with mobility, although pt denies any SOB or CP. Functional mobility assessment demonstrates increased effort/time requirements, poor tolerance, and need for physical assistance, whereas the patient performed these at a higher level of independence PTA. Pt does endorse some acute weakness, agreeable that HHPT would be beneficial. Pt will benefit from skilled PT intervention to increase independence and safety with basic mobility in preparation for discharge to the venue listed below.       Follow Up Recommendations Home health PT;Supervision - Intermittent    Equipment Recommendations  None recommended by PT    Recommendations for Other Services       Precautions / Restrictions Precautions Precautions: Fall Restrictions Weight Bearing Restrictions: No      Mobility  Bed Mobility Overal bed mobility: Modified Independent                Transfers Overall transfer level: Needs assistance Equipment used: Rolling walker (2 wheeled) Transfers: Sit to/from Stand Sit to Stand: Min guard         General transfer comment: labored, but no assist needed, appears safe, no LOB  Ambulation/Gait Ambulation/Gait assistance:  Min guard;Supervision Gait Distance (Feet): 120 Feet(distance limited due to tele rhythm changes.) Assistive device: Rolling walker (2 wheeled)       General Gait Details: Rhythm reactive to mobility, AF becomes worse with slight RVR response intermittent.  Stairs            Wheelchair Mobility    Modified Rankin (Stroke Patients Only)       Balance Overall balance assessment: Modified Independent;No apparent balance deficits (not formally assessed)                                           Pertinent Vitals/Pain Pain Assessment: No/denies pain    Home Living Family/patient expects to be discharged to:: Private residence Living Arrangements: Alone Available Help at Discharge: Family Type of Home: House Home Access: Stairs to enter Entrance Stairs-Rails: Left Entrance Stairs-Number of Steps: 2 Home Layout: One level Home Equipment: [place] - 2 wheels;Bedside commode;Shower seat;Cane - single point      Prior Function Level of Independence: Needs assistance   Gait / Transfers Assistance Needed: independent ADL; limited community distance AMB with SPC/RW when out of home; no falls history, still drives minimaqlly  ADL's / Homemaking Assistance Needed: DTR assists with groceries, meds; pt stil drives.        Hand Dominance        Extremity/Trunk Assessment   Upper Extremity Assessment Upper Extremity Assessment: Overall WFL for tasks assessed    Lower Extremity Assessment Lower Extremity Assessment: Overall WFL for tasks assessed;Generalized weakness  Communication   Communication: HOH  Cognition Arousal/Alertness: Awake/alert Behavior During Therapy: WFL for tasks assessed/performed Overall Cognitive Status: Within Functional Limits for tasks assessed                                        General Comments      Exercises     Assessment/Plan    PT Assessment Patient needs continued PT services  PT  Problem List Decreased strength;Decreased activity tolerance;Decreased mobility;Cardiopulmonary status limiting activity       PT Treatment Interventions DME instruction;Balance training;Gait training;Functional mobility training;Therapeutic activities;Therapeutic exercise;Patient/family education    PT Goals (Current goals can be found in the Care Plan section)  Acute Rehab PT Goals Patient Stated Goal: regain strength PT Goal Formulation: With patient Time For Goal Achievement: 08/14/19 Potential to Achieve Goals: Good    Frequency Min 2X/week   Barriers to discharge        Co-evaluation               AM-PAC PT "6 Clicks" Mobility  Outcome Measure Help needed turning from your back to your side while in a flat bed without using bedrails?: None Help needed moving from lying on your back to sitting on the side of a flat bed without using bedrails?: A Little Help needed moving to and from a bed to a chair (including a wheelchair)?: A Little Help needed standing up from a chair using your arms (e.g., wheelchair or bedside chair)?: A Little Help needed to walk in hospital room?: A Little Help needed climbing 3-5 steps with a railing? : A Little 6 Click Score: 19    End of Session Equipment Utilized During Treatment: Gait belt Activity Tolerance: Patient tolerated treatment well;No increased pain Patient left: in chair;with call bell/phone within reach Nurse Communication: Mobility status PT Visit Diagnosis: Difficulty in walking, not elsewhere classified (R26.2);Muscle weakness (generalized) (M62.81)    Time: 4196-2229 PT Time Calculation (min) (ACUTE ONLY): 27 min   Charges:   PT Evaluation $PT Eval Moderate Complexity: 1 Mod PT Treatments $Therapeutic Exercise: 8-22 mins       3:23 PM, 07/31/19 Etta Grandchild, PT, DPT Physical Therapist - [facility]  [phone] ([place])    [place] C 07/31/2019, 3:20 PM

## 2019-08-01 ENCOUNTER — Encounter: Payer: Self-pay | Admitting: Internal Medicine

## 2019-08-01 LAB — CBC
HCT: 25.9 % — ABNORMAL LOW (ref 39.0–52.0)
Hemoglobin: 8.1 g/dL — ABNORMAL LOW (ref 13.0–17.0)
MCH: 33.1 pg (ref 26.0–34.0)
MCHC: 31.3 g/dL (ref 30.0–36.0)
MCV: 105.7 fL — ABNORMAL HIGH (ref 80.0–100.0)
Platelets: 155 10*3/uL (ref 150–400)
RBC: 2.45 MIL/uL — ABNORMAL LOW (ref 4.22–5.81)
RDW: 17 % — ABNORMAL HIGH (ref 11.5–15.5)
WBC: 12.3 10*3/uL — ABNORMAL HIGH (ref 4.0–10.5)
nRBC: 0 % (ref 0.0–0.2)

## 2019-08-01 LAB — BASIC METABOLIC PANEL
Anion gap: 9 (ref 5–15)
BUN: 31 mg/dL — ABNORMAL HIGH (ref 8–23)
CO2: 22 mmol/L (ref 22–32)
Calcium: 7.8 mg/dL — ABNORMAL LOW (ref 8.9–10.3)
Chloride: 107 mmol/L (ref 98–111)
Creatinine, Ser: 1.69 mg/dL — ABNORMAL HIGH (ref 0.61–1.24)
GFR calc Af Amer: 42 mL/min — ABNORMAL LOW (ref >60)
GFR calc non Af Amer: 36 mL/min — ABNORMAL LOW (ref >60)
Glucose, Bld: 113 mg/dL — ABNORMAL HIGH (ref 70–99)
Potassium: 3.6 mmol/L (ref 3.5–5.1)
Sodium: 138 mmol/L (ref 135–145)

## 2019-08-01 LAB — MAGNESIUM: Magnesium: 1.8 mg/dL (ref 1.7–2.4)

## 2019-08-01 MED ORDER — MAGNESIUM SULFATE 2 GM/50ML IV SOLN
2.0000 g | Freq: Once | INTRAVENOUS | Status: AC
  Administered 2019-08-01: 2 g via INTRAVENOUS
  Filled 2019-08-01: qty 50

## 2019-08-01 NOTE — Progress Notes (Signed)
Mr. Andres Gutierrez is an 86 year old man who presented to the hospital with sepsis.  The source was thought to potentially be his gallbladder.  Due to his poor physical condition and comorbidities, an IR guided percutaneous cholecystostomy tube was placed on July 30, 2019.  This morning, he is markedly improved.  He reports only tenderness at the site of the cholecystostomy tube placement.  He is sitting up in a chair bedside, with his family visiting.  Plans are being made to transfer him out of the ICU to the medical floor.  Leukocytosis improving.  Today's Vitals   08/01/19 1500 08/01/19 1600 08/01/19 1700 08/01/19 2000  BP: 116/65 (!) 124/56 (!) 128/56   Pulse: 83 (!) 149 79   Resp: (!) 21 (!) 26 (!) 22   Temp:    98.4 F (36.9 C)  TempSrc:    Oral  SpO2: 100%  99%   Weight:      Height:      PainSc:       Body mass index is 25.12 kg/m. General: He is alert and oriented x3.  He is in no acute distress. Cardiovascular: Regular rate and rhythm Pulmonary: Normal work of breathing, no respiratory distress Abdomen: There is a percutaneous cholecystostomy tube in the right upper quadrant.  There is clear dark green-yellow bile present.  Results for RADAMES, [name] (MRN [id number]) as of 08/01/2019 21:18  Ref. Range 08/01/2019 04:44  Sodium Latest Ref Range: 135 - 145 mmol/L 138  Potassium Latest Ref Range: 3.5 - 5.1 mmol/L 3.6  Chloride Latest Ref Range: 98 - 111 mmol/L 107  CO2 Latest Ref Range: 22 - 32 mmol/L 22  Glucose Latest Ref Range: 70 - 99 mg/dL 113 (H)  BUN Latest Ref Range: 8 - 23 mg/dL 31 (H)  Creatinine Latest Ref Range: 0.61 - 1.24 mg/dL 1.69 (H)  Calcium Latest Ref Range: 8.9 - 10.3 mg/dL 7.8 (L)  Anion gap Latest Ref Range: 5 - 15  9  Magnesium Latest Ref Range: 1.7 - 2.4 mg/dL 1.8  GFR, Est Non African American Latest Ref Range: >60 mL/min 36 (L)  GFR, Est African American Latest Ref Range: >60 mL/min 42 (L)  WBC Latest Ref Range: 4.0 - 10.5 K/uL 12.3 (H)  RBC Latest Ref Range:  4.22 - 5.81 MIL/uL 2.45 (L)  Hemoglobin Latest Ref Range: 13.0 - 17.0 g/dL 8.1 (L)  HCT Latest Ref Range: 39.0 - 52.0 % 25.9 (L)  MCV Latest Ref Range: 80.0 - 100.0 fL 105.7 (H)  MCH Latest Ref Range: 26.0 - 34.0 pg 33.1  MCHC Latest Ref Range: 30.0 - 36.0 g/dL 31.3  RDW Latest Ref Range: 11.5 - 15.5 % 17.0 (H)  Platelets Latest Ref Range: 150 - 400 K/uL 155  nRBC Latest Ref Range: 0.0 - 0.2 % 0.0   Impression and plan: This is an 86 year old man who presented to the emergency department with sepsis.  Source was considered likely to be his gallbladder.  He has now undergone successful percutaneous drainage and has improved significantly.  He has been transferred out of the ICU.  He may be discharged from the hospital when deemed appropriate by the hospital medicine service.  He should follow-up with Dr. Lysle Pearl in 3 weeks for tube evaluation.

## 2019-08-01 NOTE — Progress Notes (Signed)
Name: Andres Gutierrez MRN: [id number] DOB: July 30, 1930    ADMISSION DATE:  07/29/2019 CONSULTATION DATE:  07/29/2019  REFERRING MD :  Rufina Falco, NP  CHIEF COMPLAINT:  Abdominal pain, Nausea, vomiting  BRIEF PATIENT DESCRIPTION:  86 y.o. Male with PMH notable for HFrEF (EF 20-25% in Jan 2021), HTN, CABG, and cystic fibrosis admitted 07/29/19 due to severe sepsis with septic shock of unknown etiology.  Suspected source is acalculous cholecystitis vs. Atypical Pneumonia. Possibility of Cardiogenic shock as well (EF 20-25% in Jan 2021).  SIGNIFICANT EVENTS  3/17: Presented to ED, to be admitted to Telemetry 3/17: Became hypotensive which was unresponsive to IVF resuscitation 3/17: Admit to ICU, PCCM consulted 3/18 - met with daughter to discuss care plan, patient clinically imrpoved with resolution of abd pain.  Plan for per chole tube.  Oncology eval today.  3/19- patient further imporved, he did work with PT today, did fair 3/20 -patient is clinically improved today.  Son is at bedside discussed care plan today.  Potential transfer to medical floor today.  STUDIES:  3/17: CT Abdomen & Pelvis>> 1. No acute abnormalities. 2. Stable nonobstructing calculus in the distal left ureter. 3. Nonobstructing calculus in the lower pole the right kidney is similar to the previous exam. 4. Bilateral renal cysts. 5. Coronary artery calcification. 6. Chronic right-sided pleural thickening, partially imaged may relate to prior trauma or infection. 3/17: RUQ Abdominal US>>1. Slight sludge in gallbladder. Gallbladder wall borderline thickened, less pronounced than on prior study. No gallstones are demonstrable. A degree of acalculus cholecystitis potentially could present in this manner. This finding may warrant correlation with nuclear medicine hepatobiliary imaging study to assess for cystic duct patency. 2. Echogenic right kidney, a finding that may be indicative of medical renal disease.  Appropriate renal function laboratory studies advised. 3.  Study otherwise unremarkable. 3/17: CXR>> There are scattered pulmonary opacities bilaterally. There is no pneumothorax. There may be small bilateral pleural effusions. The heart size remains enlarged. The patient is status post prior median Sternotomy. 3/17: HIDA Scan>>Nonvisualization of the gallbladder despite morphine administration, cystic duct patency cannot be established and acute cholecystitis therefore cannot be excluded. Note that false positive examinations can occur in the setting of prolonged fasting, chronic cholecystitis, and hepatocellular dysfunction.  CULTURES: SARS-CoV-2 PCR 3/17>> negative Influenza PCR 3/17>> negative MRSA PCR 3/17>> negative Blood culture x2 3/17>> Sputum 3/17>> Strep pneumo urinary antigen 3/17>> Legionella urinary antigen 3/17>>  ANTIBIOTICS: Cefepime x1 dose in ED 3/17 Flagyl x1 dose in ED 3/17>> Zosyn 3/17>> Vancomycin 3/18>>  HISTORY OF PRESENT ILLNESS:   Andres Gutierrez is a 86 year old male with a past medical history notable for HFrEF (EF 20-25% in Jan 2021), CABG, chronic kidney disease stage III, anemia, anxiety, hypertension who presented to [facility] ED on 07/29/2019 due to abdominal pain.  Per his daughter's report, he again complaining of severe epigastric pain earlier this morning.  He denied chest pain, shortness of breath, diarrhea, dysuria.  He did have an episode of vomiting in the ED.     Upon presentation to the ED he was noted to be febrile with temperature 103.1, tachycardic, but normotensive.  Initial work-up revealed WBC 16.7, procalcitonin less than 0.1, lactic acid 1.6, hemoglobin 9.3, BUN 30, creatinine 1.95.  His COVID-19 PCR and influenza PCR both negative.  CT abdomen and pelvis was obtained which was negative for any acute intra-abdominal abnormality, did reveal a nonobstructing stone in the left distal ureter, and an nonobstructing stone in the lower pole of  the right  kidney.  Abdominal ultrasound showed sludge in the gallbladder, however no stones, concerning for possible acalculous cholecystitis.  HIDA scan was obtained which was unable to visualize cystic duct patency and unable to exclude acute cholecystitis.  Chest x-ray with scattered bilateral pulmonary opacities concerning for pulmonary edema versus atypical infectious process.  He met sepsis criteria therefore he received IV fluid resuscitation and received IV cefepime and Flagyl.  General surgery is following along.  Despite IV fluid resuscitation he remained hypotensive requiring Levophed infusion.  He is admitted to the ICU by the hospitalist.    PCCM is consulted for further work-up and treatment of severe sepsis with septic shock secondary to questionable acalculous cholecystitis versus questionable atypical pneumonia.  Questionable component of cardiogenic shock given his history of HFrEF as well.  PAST MEDICAL HISTORY :   has a past medical history of Anemia, Anxiety, Cardiomyopathy ([place]), CHF (congestive heart failure) ([place]), Chronic kidney disease, Coronary artery disease, Cough, Hypertension, Lymphadenopathy, hilar, MI (myocardial infarction) ([place]), and Wheezing.  has a past surgical history that includes Coronary artery bypass graft; Coronary angioplasty with stent; Endobronchial ultrasound (N/A, 12/31/2014); Bronchial needle aspiration biopsy (N/A, 12/31/2014); Esophagogastroduodenoscopy (egd) with propofol (N/A, 06/20/2015); Cardiac catheterization (N/A, 12/28/2015); and Cataract extraction w/PHACO (Left, 08/01/2016). Prior to Admission medications   Medication Sig Start Date End Date Taking? Authorizing Provider  aspirin EC 81 MG EC tablet Take 1 tablet (81 mg total) by mouth daily. 06/10/19  Yes Danford, Suann Larry, MD  atorvastatin (LIPITOR) 40 MG tablet Take 1 tablet (40 mg total) by mouth daily at 6 PM. 12/30/15  Yes Dustin Flock, MD  digoxin (LANOXIN) 0.125 MG tablet Take 0.0625 mg by mouth  daily.   Yes [provider]  famotidine (PEPCID) 20 MG tablet Take 20 mg by mouth at bedtime. 02/04/19  Yes [provider]  ferrous sulfate 325 (65 FE) MG tablet Take 1 tablet (325 mg total) by mouth every other day. Patient taking differently: Take 325 mg by mouth every Monday, Wednesday, and Friday.  03/04/19  Yes Verlon Au, NP  furosemide (LASIX) 40 MG tablet Take 1 tablet (40 mg total) by mouth daily. Patient taking differently: Take 20 mg by mouth daily.  06/10/19  Yes Danford, Suann Larry, MD  metoprolol tartrate (LOPRESSOR) 25 MG tablet Take 0.5 tablets (12.5 mg total) by mouth 2 (two) times daily. 01/09/19  Yes Gouru, Illene Silver, MD  ranolazine (RANEXA) 1000 MG SR tablet Take 500 mg by mouth 2 (two) times daily.  01/16/19  Yes [provider]  sucralfate (CARAFATE) 1 G tablet Take 1 g by mouth 2 (two) times daily.    Yes [provider]  tamsulosin (FLOMAX) 0.4 MG CAPS capsule Take 0.4 mg by mouth daily after supper.    Yes [provider]  vitamin B-12 (CYANOCOBALAMIN) 500 MCG tablet Take 500 mcg by mouth daily.   Yes [provider]  vitamin C (VITAMIN C) 250 MG tablet Take 1 tablet (250 mg total) by mouth daily. 11/01/15  Yes Dustin Flock, MD  acetaminophen (TYLENOL) 500 MG tablet Take 500-1,000 mg by mouth every 6 (six) hours as needed for mild pain, moderate pain or fever.     [provider]  docusate sodium (COLACE) 100 MG capsule Take 100 mg by mouth daily as needed for mild constipation.     [provider]  nitroGLYCERIN (NITROSTAT) 0.4 MG SL tablet Place 0.4 mg under the tongue every 5 (five) minutes as needed for  chest pain.    [provider]  omeprazole (PRILOSEC) 20 MG capsule Take 1 capsule (20 mg total) by mouth daily. 07/29/19 07/28/20  Blake Divine, MD  polyethylene glycol (MIRALAX / GLYCOLAX) 17 g packet Take 17 g by mouth daily as needed for mild constipation. 01/09/19   Nicholes Mango, MD    Allergies  Allergen Reactions  . Levaquin [Levofloxacin]     [name], her daughter, said Levaquin should be avoided because when patient had Levaquin a few years ago, he had "a bad reaction".  She said he could not walk and could not lift up his feet.  . 5ht3 Receptor Antagonists   . Diphenhydramine Hcl Other (See Comments)    Reaction:  Rash and fever a long time ago, but has had it since with no problem.  . Escitalopram Other (See Comments)    Reaction:  Makes him feel faint, like he was going to have a heart attack.  . Maxidex [Dexamethasone] Other (See Comments)    Reaction:  Unknown   . Prednisone Other (See Comments)    Pt states that this medication made him feel crazy.    . Serotonin Other (See Comments)    Tried 2 different types, lexapro and another one.  Reaction:  Made him feel like he was having a heart attack.   . Vytorin [Ezetimibe-Simvastatin] Other (See Comments)    Reaction:  Unknown   . Zocor [Simvastatin] Other (See Comments)    Reaction:  Unknown   . Diltiazem Rash    FAMILY HISTORY:  family history includes CAD in his mother; Colon cancer in his father. SOCIAL HISTORY:  reports that he quit smoking about 42 years ago. His smokeless tobacco use includes chew. He reports current alcohol use. He reports that he does not use drugs.     REVIEW OF SYSTEMS:  Positives in BOLD Constitutional: Negative for fever, chills, weight loss, malaise/fatigue and diaphoresis.  HENT: Negative for hearing loss, ear pain, nosebleeds, congestion, sore throat, neck pain, tinnitus and ear discharge.   Eyes: Negative for blurred vision, double vision, photophobia, pain, discharge and redness.  Respiratory: Negative for cough, hemoptysis, sputum production, shortness of breath, wheezing and stridor.   Cardiovascular: Negative for chest pain, palpitations, orthopnea, claudication, leg swelling and PND.  Gastrointestinal: Negative for heartburn, nausea, vomiting, , diarrhea,  constipation, blood in stool and melena.  Genitourinary: Negative for dysuria, urgency, frequency, hematuria and flank pain.  Musculoskeletal: Negative for myalgias, back pain, joint pain and falls.  Skin: Negative for itching and rash.  Neurological: Negative for dizziness, tingling, tremors, sensory change, speech change, focal weakness, seizures, loss of consciousness, weakness and headaches.  Endo/Heme/Allergies: Negative for environmental allergies and polydipsia. Does not bruise/bleed easily.  SUBJECTIVE:  Pt reports abdominal pain which has greatly improved Denies chest pain, SOB, N/V, diarrhea, dysuria, fever/chills On 6 mcg Levophed infusion  VITAL SIGNS: Temp:  [97.4 F (36.3 C)-98.6 F (37 C)] 98.6 F (37 C) (03/20 0800) Pulse Rate:  [73-117] 98 (03/20 1046) Resp:  [14-28] 25 (03/20 1000) BP: (105-137)/(53-74) 129/67 (03/20 1046) SpO2:  [94 %-99 %] 98 % (03/20 1000)  PHYSICAL EXAMINATION: General:  Acutely ill appearing male, sitting in bed, on nasal cannula, in NAD Neuro:  Awake, A&O x4, speech clear, follow commands, no focal deficits, pupils PERRL HEENT:  Atraumatic, normocephalic, neck supple, no JVD Cardiovascular: Regular rate and rhythm, S1-S2, no murmurs, rubs, gallops, 1+ pulses Lungs: Clear to auscultation bilaterally, even, nonlabored, normal effort Abdomen: Obese, soft, mildly  tender to palpation, no distention, no guarding or rebound tenderness, bowel sounds positive x4 Musculoskeletal: Appropriate bulk and tone given age, no deformities, no edema Skin: Warm and dry, no obvious rashes, lesions, or ulcerations  Recent Labs  Lab 07/30/19 0224 07/31/19 0413 08/01/19 0444  NA 138 135  136 138  K 4.3 3.9  3.8 3.6  CL 105 104  104 107  CO2 24 19*  21* 22  BUN 28* 32*  32* 31*  CREATININE 1.91* 1.85*  1.87* 1.69*  GLUCOSE 111* 101*  108* 113*   Recent Labs  Lab 07/30/19 0942 07/31/19 0413 08/01/19 0444  HGB 8.6* 8.0* 8.1*  HCT 27.3* 25.6*  25.9*  WBC 46.6* 20.6* 12.3*  PLT 122* 132* 155   CT PERC CHOLECYSTOSTOMY  Result Date: 07/30/2019 INDICATION: Possible cholecystitis. EXAM: PERCUTANEOUS CHOLECYSTOSTOMY. MEDICATIONS: Administered inpatient medications. 5000 unit dosage of heparin discontinued this morning. ANESTHESIA/SEDATION: Moderate (conscious) sedation was employed during this procedure. A total of Versed 1.0 mg and Fentanyl 75 mcg was administered intravenously. Moderate Sedation Time: 25 minutes. The patient's level of consciousness and vital signs were monitored continuously by radiology nursing throughout the procedure under my direct supervision. FLUOROSCOPY TIME:  Fluoroscopy Time: 0 minutes 0 seconds COMPLICATIONS: None immediate. PROCEDURE: After obtaining informed consent. Patient was sterilely prepped and draped. Gallbladder was located with CT guidance. Patient sterilely prepped and draped. Following local anesthesia with 1% lidocaine and IV conscious sedation an 18 gauge needle was advanced into the gallbladder under CT guidance. A Amplatz 035 wire was placed. An 8 French pigtail drainage catheter was placed. Confirmation of a good catheter placement was made with CT. 20 cc of slightly opaque bile was removed and sent to microbiology for further evaluation. The catheter was sutured into place and placed to bag drainage. There were no complications. IMPRESSION: Successful percutaneous cholecystostomy. Electronically Signed   By: Marcello Moores  Register   On: 07/30/2019 15:15      ASSESSMENT / PLAN:   Circulatory shock likely distributive due to sepsis -Due to intra-abdominal infection with cholecystitis status post cholecystostomy tube -HIDA scan unable to establish patency of cystic duct, therefor acute cholecystitis unable to be excluded -surgery consultation-appreciate input -IV zosyn/vancomycin -Levophed -weaned off -PT/OT patient participating and doing better. -WBC count significantly improved -Optimizing for  transfer out of medical intensive care unit  Acute on chronic systolic CHF with EF 06-30% -patient without peripheral but does have moderate pulmonary edema -gentle diuresis when BP improves and off vasopressors Mildly elevated troponin, likely demand ischemia -Continuous cardiac monitoring -Maintain MAP greater than 65 -lactic acid>3 - cortisol in process - BNP-1067   Chronic kidney disease stage III (currently stable) -Monitor I&O's / urinary output -Follow BMP -Ensure adequate renal perfusion -Avoid nephrotoxic agents as able -Replace electrolytes as indicated -Cautious IV fluids given CHF  Anemia of chronic disease  - CKD and MDS -heme/onc consult - appreciate eval and recommendations -Monitor for S/Sx of bleeding -Trend CBC -SQ Heparin for VTE Prophylaxis  -Transfuse for Hgb <7  Severe leukocytosis  - possible due to sepsis however typically not this elevated , but would like to get input from onc regarding this due to MDS history     Chronic cystic lung disease - bilateral small thin walled asymetrically distributed small and large sized cysts - largest >4CM LUL -consisted with LIP - seen in autoimmune disease such as Sjogrens and chronic immunodeficient state -no need for additianal testing or treatment of this lung disease due to likey insignificant  clincal impact    Pt's prognosis is guarded and is at high risk of cardiac arrest and death.  DISPOSITION: ICU GOALS OF CARE: Full Code VTE PROPHYLAXIS: SQ Heparin STRESS ULCER PROPHYLAXIS: Pepcid UPDATES: Updated pt at bedside 07/29/19 CONSULTS: Hospitalist, General Surgery  Critical care provider statement:    Critical care time (minutes):  33   Critical care time was exclusive of:  Separately billable procedures and  treating other patients   Critical care was necessary to treat or prevent imminent or  life-threatening deterioration of the following conditions:  sepsis, MDS, severe leukocytosis, shock, CKD,  CHF, pulmonary edema, anemia, abd pain, cholecystitis, multiple comorbid conditions   Critical care was time spent personally by me on the following  activities:  Development of treatment plan with patient or surrogate,  discussions with consultants, evaluation of patient's response to  treatment, examination of patient, obtaining history from patient or  surrogate, ordering and performing treatments and interventions, ordering  and review of laboratory studies and re-evaluation of patient's condition   I assumed direction of critical care for this patient from another  provider in my specialty: no      Ottie Glazier, M.D.  Pulmonary & [place]

## 2019-08-02 LAB — BASIC METABOLIC PANEL
Anion gap: 7 (ref 5–15)
BUN: 29 mg/dL — ABNORMAL HIGH (ref 8–23)
CO2: 23 mmol/L (ref 22–32)
Calcium: 7.7 mg/dL — ABNORMAL LOW (ref 8.9–10.3)
Chloride: 107 mmol/L (ref 98–111)
Creatinine, Ser: 1.53 mg/dL — ABNORMAL HIGH (ref 0.61–1.24)
GFR calc Af Amer: 47 mL/min — ABNORMAL LOW (ref >60)
GFR calc non Af Amer: 41 mL/min — ABNORMAL LOW (ref >60)
Glucose, Bld: 105 mg/dL — ABNORMAL HIGH (ref 70–99)
Potassium: 3.6 mmol/L (ref 3.5–5.1)
Sodium: 137 mmol/L (ref 135–145)

## 2019-08-02 LAB — MAGNESIUM: Magnesium: 2.1 mg/dL (ref 1.7–2.4)

## 2019-08-02 LAB — CBC
HCT: 24.1 % — ABNORMAL LOW (ref 39.0–52.0)
Hemoglobin: 7.6 g/dL — ABNORMAL LOW (ref 13.0–17.0)
MCH: 33.5 pg (ref 26.0–34.0)
MCHC: 31.5 g/dL (ref 30.0–36.0)
MCV: 106.2 fL — ABNORMAL HIGH (ref 80.0–100.0)
Platelets: 141 10*3/uL — ABNORMAL LOW (ref 150–400)
RBC: 2.27 MIL/uL — ABNORMAL LOW (ref 4.22–5.81)
RDW: 16.7 % — ABNORMAL HIGH (ref 11.5–15.5)
WBC: 8.1 10*3/uL (ref 4.0–10.5)
nRBC: 0 % (ref 0.0–0.2)

## 2019-08-02 LAB — LEGIONELLA PNEUMOPHILA SEROGP 1 UR AG: L. pneumophila Serogp 1 Ur Ag: NEGATIVE

## 2019-08-02 MED ORDER — HYDROCODONE-ACETAMINOPHEN 5-325 MG PO TABS: 1.0000 | ORAL_TABLET | Freq: Four times a day (QID) | ORAL | 0 refills | Status: DC | PRN

## 2019-08-02 MED ORDER — AMOXICILLIN-POT CLAVULANATE 875-125 MG PO TABS: 1.0000 | ORAL_TABLET | Freq: Two times a day (BID) | ORAL | 0 refills | Status: AC

## 2019-08-02 NOTE — TOC Transition Note (Addendum)
Transition of Care [facility]) - CM/SW Discharge Note   Patient Details  Name: Andres Gutierrez MRN: [id number] Date of Birth: 1931-01-11  Transition of Care [facility]) CM/SW Contact:  Ova Freshwater Phone Number: [phone] 08/02/2019, 10:47 AM   Clinical Narrative:    Received call from [facility], ICU.  Patient ready for discharge today 08/02/2019.  Patient wants home health and will need an RN and home-health aide with some ADLs.  This SW spoke with patient's daughter Tiana Loft, and she stated patient was continent, able to ambulate with walker and reheat and prepare small meals and get dressed on his own. Ms. Greggory Stallion stated patient will need assistance with bathing.  Ms. Greggory Stallion stated her main concerns is maintaining the drain sight clean and disinfected.  Ms. Greggory Stallion inquired as to how often the RN would ome by to see him for wound care.  This SW stated she needed to ask the nurse how often the doctor wanted the sight cleared and that would be in the home-health order.  This SW informed Ms. Blaylock the nurse was educating the patient on maintenance and care of the drain, and she could also inquire about what to be aware of, when she came to pick up the patient.This SW explained to Ms. Blaylock the home health RN may not be able to got to the home for 48 hours, and the nurse will explain to the patient and her how to care for the drain site. Ms. Greggory Stallion stated she understood   Contacted Jason at [employer] and he will be contacting the patient's daughter to set up home health.    Barriers to Discharge: Continued Medical Work up   Patient Goals and CMS Choice        Discharge Placement                       Discharge Plan and Services                                     Social Determinants of Health (SDOH) Interventions     Readmission Risk Interventions Readmission Risk Prevention Plan 07/30/2019 06/08/2019  Transportation Screening Complete  Complete  PCP or Specialist Appt within 3-5 Days Complete Complete  HRI or Home Care Consult Complete Complete  Social Work Consult for [place] Planning/Counseling - Complete  Palliative Care Screening - Not Applicable  Medication Review [employer]) Complete Complete  Some recent data might be hidden

## 2019-08-02 NOTE — Progress Notes (Signed)
Pt has remained alert and oriented with minimal c/o achy 2/10 soreness from drain site->pt request no pain meds. Pt has maintained sinus arrhythmia with occasional multifocal PVCs on cardiac monitor, HR WNL. Pt has remained on RA, SpO2 > 90%, lung sounds clear to auscultation, NDN.  Drain has been flushed and dressing changed.  Daughter and son at bedside for discharge education regarding drain care and s/s of infection. Printed AVS with written education provided.

## 2019-08-02 NOTE — Discharge Summary (Signed)
Physician Discharge Summary  Patient ID: Andres Gutierrez MRN: [id number] DOB/AGE: 08/04/1930 86 y.o.  Admit date: 07/29/2019 Discharge date: 08/02/2019  Admission Diagnoses:  Discharge Diagnoses:  Active Problems:   Sepsis ([place])   Acute cholecystitis   Malnutrition of moderate degree   Acute neutrophilia   Discharged Condition: good  Hospital Course: Pt with chylecystitis and sepsis s/p cholecystostomy tube and medical management  Consults: general surgery  Significant Diagnostic Studies: microbiology: infected gallbladder  Treatments: IV hydration, antibiotics: Zosyn, analgesia: acetaminophen w/ codeine and surgery: cholecystostomy  Discharge Exam: Blood pressure (!) 120/57, pulse 81, temperature 98.3 F (36.8 C), temperature source Oral, resp. rate (!) 22, height 6\' 4"  (1.93 m), weight 93.5 kg, SpO2 99 %. General appearance: alert, cooperative and appears stated age Head: Normocephalic, without obvious abnormality, atraumatic Neck: no adenopathy, no carotid bruit, no JVD, supple, symmetrical, trachea midline, thyroid not enlarged, symmetric, no tenderness/mass/nodules and drain in RUQ with active drainange Resp: normal percussion bilaterally Cardio: regular rate and rhythm, S1, S2 normal, no murmur, click, rub or gallop GI: soft, non-tender; bowel sounds normal; no masses,  no organomegaly and +drain RUQ Extremities: extremities normal, atraumatic, no cyanosis or edema Neurologic: Alert and oriented X 3, normal strength and tone. Normal symmetric reflexes. Normal coordination and gait  Disposition: Discharge disposition: 01-Home or [place].   Specialty: Emergency Medicine Why: If symptoms worsen Contact information: [place] [id number] [address] [place] [phone]       Schedule an appointment as soon as possible for a visit with Rusty Aus, MD.   Specialty: Internal Medicine Why: Please schedule follow up PCP appointment for within 5-7 days of discharge date.  Contact information: [place] [address] [phone]        Schedule an appointment as soon as possible for a visit  with Cammie Sickle, MD.   Specialties: Internal Medicine, Oncology Contact information: [place] [address] [phone]           Signed: Ottie Glazier 08/02/2019, 8:35 AM

## 2019-08-02 NOTE — Progress Notes (Signed)
Name: Andres Gutierrez MRN: [id number] DOB: [birth year]    ADMISSION DATE:  07/29/2019 CONSULTATION DATE:  07/29/2019  REFERRING MD :  Rufina Falco, NP  CHIEF COMPLAINT:  Abdominal pain, Nausea, vomiting  BRIEF PATIENT DESCRIPTION:  86 y.o. Male with PMH notable for HFrEF (EF 20-25% in Jan 2021), HTN, CABG, and cystic fibrosis admitted 07/29/19 due to severe sepsis with septic shock of unknown etiology.  Suspected source is acalculous cholecystitis vs. Atypical Pneumonia. Possibility of Cardiogenic shock as well (EF 20-25% in Jan 2021).  SIGNIFICANT EVENTS  3/17: Presented to ED, to be admitted to Telemetry 3/17: Became hypotensive which was unresponsive to IVF resuscitation 3/17: Admit to ICU, PCCM consulted 3/18 - met with daughter to discuss care plan, patient clinically imrpoved with resolution of abd pain.  Plan for per chole tube.  Oncology eval today.  3/19- patient further imporved, he did work with PT today, did fair 3/20 -patient is clinically improved today.  Son is at bedside discussed care plan today.  Potential transfer to medical floor today. 3/21- optimizing medically for d/c today, discussed with Hospitalist team they asked me to discharge patient directly from MICU rather then monitoring on floor prior to d/c   STUDIES:  3/17: CT Abdomen & Pelvis>> 1. No acute abnormalities. 2. Stable nonobstructing calculus in the distal left ureter. 3. Nonobstructing calculus in the lower pole the right kidney is similar to the previous exam. 4. Bilateral renal cysts. 5. Coronary artery calcification. 6. Chronic right-sided pleural thickening, partially imaged may relate to prior trauma or infection. 3/17: RUQ Abdominal US>>1. Slight sludge in gallbladder. Gallbladder wall borderline thickened, less pronounced than on prior study. No gallstones are demonstrable. A degree of acalculus cholecystitis potentially could present in this manner. This finding may warrant correlation  with nuclear medicine hepatobiliary imaging study to assess for cystic duct patency. 2. Echogenic right kidney, a finding that may be indicative of medical renal disease. Appropriate renal function laboratory studies advised. 3.  Study otherwise unremarkable. 3/17: CXR>> There are scattered pulmonary opacities bilaterally. There is no pneumothorax. There may be small bilateral pleural effusions. The heart size remains enlarged. The patient is status post prior median Sternotomy. 3/17: HIDA Scan>>Nonvisualization of the gallbladder despite morphine administration, cystic duct patency cannot be established and acute cholecystitis therefore cannot be excluded. Note that false positive examinations can occur in the setting of prolonged fasting, chronic cholecystitis, and hepatocellular dysfunction.  CULTURES: SARS-CoV-2 PCR 3/17>> negative Influenza PCR 3/17>> negative MRSA PCR 3/17>> negative Blood culture x2 3/17>> Sputum 3/17>> Strep pneumo urinary antigen 3/17>> Legionella urinary antigen 3/17>>  ANTIBIOTICS: Cefepime x1 dose in ED 3/17 Flagyl x1 dose in ED 3/17>> Zosyn 3/17>> Vancomycin 3/18>>  HISTORY OF PRESENT ILLNESS:   Andres Gutierrez is a 86 year old male with a past medical history notable for HFrEF (EF 20-25% in Jan 2021), CABG, chronic kidney disease stage III, anemia, anxiety, hypertension who presented to [facility] ED on 07/29/2019 due to abdominal pain.  Per his daughter's report, he again complaining of severe epigastric pain earlier this morning.  He denied chest pain, shortness of breath, diarrhea, dysuria.  He did have an episode of vomiting in the ED.     Upon presentation to the ED he was noted to be febrile with temperature 103.1, tachycardic, but normotensive.  Initial work-up revealed WBC 16.7, procalcitonin less than 0.1, lactic acid 1.6, hemoglobin 9.3, BUN 30, creatinine 1.95.  His COVID-19 PCR and influenza PCR both negative.  CT abdomen and pelvis was  obtained which  was negative for any acute intra-abdominal abnormality, did reveal a nonobstructing stone in the left distal ureter, and an nonobstructing stone in the lower pole of the right kidney.  Abdominal ultrasound showed sludge in the gallbladder, however no stones, concerning for possible acalculous cholecystitis.  HIDA scan was obtained which was unable to visualize cystic duct patency and unable to exclude acute cholecystitis.  Chest x-ray with scattered bilateral pulmonary opacities concerning for pulmonary edema versus atypical infectious process.  He met sepsis criteria therefore he received IV fluid resuscitation and received IV cefepime and Flagyl.  General surgery is following along.  Despite IV fluid resuscitation he remained hypotensive requiring Levophed infusion.  He is admitted to the ICU by the hospitalist.    PCCM is consulted for further work-up and treatment of severe sepsis with septic shock secondary to questionable acalculous cholecystitis versus questionable atypical pneumonia.  Questionable component of cardiogenic shock given his history of HFrEF as well.  PAST MEDICAL HISTORY :   has a past medical history of Anemia, Anxiety, Cardiomyopathy ([place]), CHF (congestive heart failure) ([place]), Chronic kidney disease, Coronary artery disease, Cough, Hypertension, Lymphadenopathy, hilar, MI (myocardial infarction) ([place]), and Wheezing.  has a past surgical history that includes Coronary artery bypass graft; Coronary angioplasty with stent; Endobronchial ultrasound (N/A, 12/31/2014); Bronchial needle aspiration biopsy (N/A, 12/31/2014); Esophagogastroduodenoscopy (egd) with propofol (N/A, 06/20/2015); Cardiac catheterization (N/A, 12/28/2015); and Cataract extraction w/PHACO (Left, 08/01/2016). Prior to Admission medications   Medication Sig Start Date End Date Taking? Authorizing Provider  aspirin EC 81 MG EC tablet Take 1 tablet (81 mg total) by mouth daily. 06/10/19  Yes Danford, Suann Larry, MD   atorvastatin (LIPITOR) 40 MG tablet Take 1 tablet (40 mg total) by mouth daily at 6 PM. 12/30/15  Yes Dustin Flock, MD  digoxin (LANOXIN) 0.125 MG tablet Take 0.0625 mg by mouth daily.   Yes [provider]  famotidine (PEPCID) 20 MG tablet Take 20 mg by mouth at bedtime. 02/04/19  Yes [provider]  ferrous sulfate 325 (65 FE) MG tablet Take 1 tablet (325 mg total) by mouth every other day. Patient taking differently: Take 325 mg by mouth every Monday, Wednesday, and Friday.  03/04/19  Yes Verlon Au, NP  furosemide (LASIX) 40 MG tablet Take 1 tablet (40 mg total) by mouth daily. Patient taking differently: Take 20 mg by mouth daily.  06/10/19  Yes Danford, Suann Larry, MD  metoprolol tartrate (LOPRESSOR) 25 MG tablet Take 0.5 tablets (12.5 mg total) by mouth 2 (two) times daily. 01/09/19  Yes Gouru, Illene Silver, MD  ranolazine (RANEXA) 1000 MG SR tablet Take 500 mg by mouth 2 (two) times daily.  01/16/19  Yes [provider]  sucralfate (CARAFATE) 1 G tablet Take 1 g by mouth 2 (two) times daily.    Yes [provider]  tamsulosin (FLOMAX) 0.4 MG CAPS capsule Take 0.4 mg by mouth daily after supper.    Yes [provider]  vitamin B-12 (CYANOCOBALAMIN) 500 MCG tablet Take 500 mcg by mouth daily.   Yes [provider]  vitamin C (VITAMIN C) 250 MG tablet Take 1 tablet (250 mg total) by mouth daily. 11/01/15  Yes Dustin Flock, MD  acetaminophen (TYLENOL) 500 MG tablet Take 500-1,000 mg by mouth every 6 (six) hours as needed for mild pain, moderate pain or fever.     [provider]  docusate sodium (COLACE) 100 MG capsule Take 100 mg by mouth daily as needed for mild  constipation.     [provider]  nitroGLYCERIN (NITROSTAT) 0.4 MG SL tablet Place 0.4 mg under the tongue every 5 (five) minutes as needed for chest pain.    [provider]  omeprazole (PRILOSEC) 20 MG capsule Take 1 capsule (20 mg total) by mouth  daily. 07/29/19 07/28/20  Blake Divine, MD  polyethylene glycol (MIRALAX / GLYCOLAX) 17 g packet Take 17 g by mouth daily as needed for mild constipation. 01/09/19   Nicholes Mango, MD   Allergies  Allergen Reactions  . Levaquin [Levofloxacin]     [name], her daughter, said Levaquin should be avoided because when patient had Levaquin a few years ago, he had "a bad reaction".  She said he could not walk and could not lift up his feet.  . 5ht3 Receptor Antagonists   . Diphenhydramine Hcl Other (See Comments)    Reaction:  Rash and fever a long time ago, but has had it since with no problem.  . Escitalopram Other (See Comments)    Reaction:  Makes him feel faint, like he was going to have a heart attack.  . Maxidex [Dexamethasone] Other (See Comments)    Reaction:  Unknown   . Prednisone Other (See Comments)    Pt states that this medication made him feel crazy.    . Serotonin Other (See Comments)    Tried 2 different types, lexapro and another one.  Reaction:  Made him feel like he was having a heart attack.   . Vytorin [Ezetimibe-Simvastatin] Other (See Comments)    Reaction:  Unknown   . Zocor [Simvastatin] Other (See Comments)    Reaction:  Unknown   . Diltiazem Rash    FAMILY HISTORY:  family history includes CAD in his mother; Colon cancer in his father. SOCIAL HISTORY:  reports that he quit smoking about 42 years ago. His smokeless tobacco use includes chew. He reports current alcohol use. He reports that he does not use drugs.     REVIEW OF SYSTEMS:  Positives in BOLD Constitutional: Negative for fever, chills, weight loss, malaise/fatigue and diaphoresis.  HENT: Negative for hearing loss, ear pain, nosebleeds, congestion, sore throat, neck pain, tinnitus and ear discharge.   Eyes: Negative for blurred vision, double vision, photophobia, pain, discharge and redness.  Respiratory: Negative for cough, hemoptysis, sputum production, shortness of breath, wheezing and stridor.     Cardiovascular: Negative for chest pain, palpitations, orthopnea, claudication, leg swelling and PND.  Gastrointestinal: Negative for heartburn, nausea, vomiting, , diarrhea, constipation, blood in stool and melena.  Genitourinary: Negative for dysuria, urgency, frequency, hematuria and flank pain.  Musculoskeletal: Negative for myalgias, back pain, joint pain and falls.  Skin: Negative for itching and rash.  Neurological: Negative for dizziness, tingling, tremors, sensory change, speech change, focal weakness, seizures, loss of consciousness, weakness and headaches.  Endo/Heme/Allergies: Negative for environmental allergies and polydipsia. Does not bruise/bleed easily.  SUBJECTIVE:  Pt reports abdominal pain which has greatly improved Denies chest pain, SOB, N/V, diarrhea, dysuria, fever/chills On 6 mcg Levophed infusion  VITAL SIGNS: Temp:  [97.6 F (36.4 C)-98.7 F (37.1 C)] 98.3 F (36.8 C) (03/21 0400) Pulse Rate:  [79-149] 81 (03/20 2118) Resp:  [19-26] 22 (03/20 1700) BP: (116-152)/(56-73) 120/57 (03/20 2118) SpO2:  [97 %-100 %] 99 % (03/20 1700) Weight:  [93.5 kg] 93.5 kg (03/21 0500)  PHYSICAL EXAMINATION: General:  Acutely ill appearing male, sitting in bed, on nasal cannula, in NAD Neuro:  Awake, A&O x4, speech clear, follow commands, no  focal deficits, pupils PERRL HEENT:  Atraumatic, normocephalic, neck supple, no JVD Cardiovascular: Regular rate and rhythm, S1-S2, no murmurs, rubs, gallops, 1+ pulses Lungs: Clear to auscultation bilaterally, even, nonlabored, normal effort Abdomen: Obese, soft, mildly tender to palpation, no distention, no guarding or rebound tenderness, bowel sounds positive x4 Musculoskeletal: Appropriate bulk and tone given age, no deformities, no edema Skin: Warm and dry, no obvious rashes, lesions, or ulcerations  Recent Labs  Lab 07/31/19 0413 08/01/19 0444 08/02/19 0459  NA 135  136 138 137  K 3.9  3.8 3.6 3.6  CL 104  104 107 107   CO2 19*  21* 22 23  BUN 32*  32* 31* 29*  CREATININE 1.85*  1.87* 1.69* 1.53*  GLUCOSE 101*  108* 113* 105*   Recent Labs  Lab 07/31/19 0413 08/01/19 0444 08/02/19 0459  HGB 8.0* 8.1* 7.6*  HCT 25.6* 25.9* 24.1*  WBC 20.6* 12.3* 8.1  PLT 132* 155 141*   No results found.    ASSESSMENT / PLAN:   Circulatory shock likely distributive due to sepsis -Due to intra-abdominal infection with cholecystitis status post cholecystostomy tube -HIDA scan unable to establish patency of cystic duct, therefor acute cholecystitis unable to be excluded -surgery consultation-appreciate input -IV zosyn/vancomycin -Levophed -weaned off -PT/OT patient participating and doing better. -WBC count significantly improved -Optimizing for transfer out of medical intensive care unit  Acute on chronic systolic CHF with EF 60-63% -patient without peripheral but does have moderate pulmonary edema -gentle diuresis when BP improves and off vasopressors Mildly elevated troponin, likely demand ischemia -Continuous cardiac monitoring -Maintain MAP greater than 65 -lactic acid>3 - cortisol in process - BNP-1067   Chronic kidney disease stage III (currently stable) -Monitor I&O's / urinary output -Follow BMP -Ensure adequate renal perfusion -Avoid nephrotoxic agents as able -Replace electrolytes as indicated -Cautious IV fluids given CHF  Anemia of chronic disease  - CKD and MDS -heme/onc consult - appreciate eval and recommendations -Monitor for S/Sx of bleeding -Trend CBC -SQ Heparin for VTE Prophylaxis  -Transfuse for Hgb <7  Severe leukocytosis  - possible due to sepsis however typically not this elevated , but would like to get input from onc regarding this due to MDS history     Chronic cystic lung disease - bilateral small thin walled asymetrically distributed small and large sized cysts - largest >4CM LUL -consisted with LIP - seen in autoimmune disease such as Sjogrens and  chronic immunodeficient state -no need for additianal testing or treatment of this lung disease due to likey insignificant clincal impact    Pt's prognosis is guarded and is at high risk of cardiac arrest and death.  DISPOSITION: ICU GOALS OF CARE: Full Code VTE PROPHYLAXIS: SQ Heparin STRESS ULCER PROPHYLAXIS: Pepcid UPDATES: Updated pt at bedside 07/29/19 CONSULTS: Hospitalist, General Surgery   Ottie Glazier, M.D.  [facility] [place]

## 2019-08-03 ENCOUNTER — Inpatient Hospital Stay: Payer: PPO

## 2019-08-03 LAB — COMP PANEL: LEUKEMIA/LYMPHOMA

## 2019-08-03 LAB — CULTURE, BLOOD (ROUTINE X 2)
Culture: NO GROWTH
Culture: NO GROWTH
Special Requests: ADEQUATE
Special Requests: ADEQUATE

## 2019-08-04 DIAGNOSIS — D631 Anemia in chronic kidney disease: Secondary | ICD-10-CM | POA: Diagnosis not present

## 2019-08-04 DIAGNOSIS — A419 Sepsis, unspecified organism: Secondary | ICD-10-CM | POA: Diagnosis not present

## 2019-08-04 DIAGNOSIS — J440 Chronic obstructive pulmonary disease with acute lower respiratory infection: Secondary | ICD-10-CM | POA: Diagnosis not present

## 2019-08-04 DIAGNOSIS — J441 Chronic obstructive pulmonary disease with (acute) exacerbation: Secondary | ICD-10-CM | POA: Diagnosis not present

## 2019-08-04 DIAGNOSIS — I130 Hypertensive heart and chronic kidney disease with heart failure and stage 1 through stage 4 chronic kidney disease, or unspecified chronic kidney disease: Secondary | ICD-10-CM | POA: Diagnosis not present

## 2019-08-04 DIAGNOSIS — J189 Pneumonia, unspecified organism: Secondary | ICD-10-CM | POA: Diagnosis not present

## 2019-08-04 DIAGNOSIS — N183 Chronic kidney disease, stage 3 unspecified: Secondary | ICD-10-CM | POA: Diagnosis not present

## 2019-08-04 DIAGNOSIS — I5023 Acute on chronic systolic (congestive) heart failure: Secondary | ICD-10-CM | POA: Diagnosis not present

## 2019-08-04 DIAGNOSIS — I2510 Atherosclerotic heart disease of native coronary artery without angina pectoris: Secondary | ICD-10-CM | POA: Diagnosis not present

## 2019-08-04 DIAGNOSIS — Z4803 Encounter for change or removal of drains: Secondary | ICD-10-CM | POA: Diagnosis not present

## 2019-08-04 DIAGNOSIS — K8000 Calculus of gallbladder with acute cholecystitis without obstruction: Secondary | ICD-10-CM | POA: Diagnosis not present

## 2019-08-04 DIAGNOSIS — I252 Old myocardial infarction: Secondary | ICD-10-CM | POA: Diagnosis not present

## 2019-08-06 DIAGNOSIS — K810 Acute cholecystitis: Secondary | ICD-10-CM | POA: Diagnosis not present

## 2019-08-06 DIAGNOSIS — E440 Moderate protein-calorie malnutrition: Secondary | ICD-10-CM | POA: Diagnosis not present

## 2019-08-06 DIAGNOSIS — A4150 Gram-negative sepsis, unspecified: Secondary | ICD-10-CM | POA: Diagnosis not present

## 2019-08-06 LAB — ANAEROBIC CULTURE

## 2019-08-11 ENCOUNTER — Other Ambulatory Visit: Payer: Self-pay | Admitting: Surgery

## 2019-08-11 DIAGNOSIS — K819 Cholecystitis, unspecified: Secondary | ICD-10-CM

## 2019-08-13 ENCOUNTER — Other Ambulatory Visit: Payer: PPO

## 2019-08-13 ENCOUNTER — Ambulatory Visit
Admission: RE | Admit: 2019-08-13 | Discharge: 2019-08-13 | Disposition: A | Discharge status: Home / Self Care | Payer: PPO | Source: Ambulatory Visit | Attending: Surgery | Admitting: Surgery

## 2019-08-13 ENCOUNTER — Other Ambulatory Visit: Payer: Self-pay

## 2019-08-13 DIAGNOSIS — D72829 Elevated white blood cell count, unspecified: Secondary | ICD-10-CM | POA: Diagnosis not present

## 2019-08-13 DIAGNOSIS — K819 Cholecystitis, unspecified: Secondary | ICD-10-CM | POA: Insufficient documentation

## 2019-08-13 DIAGNOSIS — I5023 Acute on chronic systolic (congestive) heart failure: Secondary | ICD-10-CM | POA: Diagnosis not present

## 2019-08-13 DIAGNOSIS — I2510 Atherosclerotic heart disease of native coronary artery without angina pectoris: Secondary | ICD-10-CM | POA: Diagnosis not present

## 2019-08-13 DIAGNOSIS — Z4803 Encounter for change or removal of drains: Secondary | ICD-10-CM | POA: Diagnosis not present

## 2019-08-13 DIAGNOSIS — N183 Chronic kidney disease, stage 3 unspecified: Secondary | ICD-10-CM | POA: Diagnosis not present

## 2019-08-13 DIAGNOSIS — T85520A Displacement of bile duct prosthesis, initial encounter: Secondary | ICD-10-CM | POA: Diagnosis not present

## 2019-08-13 DIAGNOSIS — I130 Hypertensive heart and chronic kidney disease with heart failure and stage 1 through stage 4 chronic kidney disease, or unspecified chronic kidney disease: Secondary | ICD-10-CM | POA: Diagnosis not present

## 2019-08-13 DIAGNOSIS — T85528A Displacement of other gastrointestinal prosthetic devices, implants and grafts, initial encounter: Secondary | ICD-10-CM | POA: Diagnosis not present

## 2019-08-13 DIAGNOSIS — J441 Chronic obstructive pulmonary disease with (acute) exacerbation: Secondary | ICD-10-CM | POA: Diagnosis not present

## 2019-08-13 DIAGNOSIS — R39198 Other difficulties with micturition: Secondary | ICD-10-CM | POA: Diagnosis not present

## 2019-08-13 DIAGNOSIS — J189 Pneumonia, unspecified organism: Secondary | ICD-10-CM | POA: Diagnosis not present

## 2019-08-13 DIAGNOSIS — K8000 Calculus of gallbladder with acute cholecystitis without obstruction: Secondary | ICD-10-CM | POA: Diagnosis not present

## 2019-08-13 DIAGNOSIS — I252 Old myocardial infarction: Secondary | ICD-10-CM | POA: Diagnosis not present

## 2019-08-13 DIAGNOSIS — J440 Chronic obstructive pulmonary disease with acute lower respiratory infection: Secondary | ICD-10-CM | POA: Diagnosis not present

## 2019-08-13 DIAGNOSIS — D631 Anemia in chronic kidney disease: Secondary | ICD-10-CM | POA: Diagnosis not present

## 2019-08-13 DIAGNOSIS — R34 Anuria and oliguria: Secondary | ICD-10-CM | POA: Diagnosis not present

## 2019-08-13 DIAGNOSIS — A419 Sepsis, unspecified organism: Secondary | ICD-10-CM | POA: Diagnosis not present

## 2019-08-13 HISTORY — PX: IR CHOLANGIOGRAM EXISTING TUBE: IMG6040

## 2019-08-13 MED ORDER — IODIXANOL 320 MG/ML IV SOLN
50.0000 mL | Freq: Once | INTRAVENOUS | Status: AC | PRN
  Administered 2019-08-13: 5 mL

## 2019-08-13 NOTE — Procedures (Signed)
Interventional Radiology Procedure Note  Procedure: Exchange of perc chole, with new 95F drain placed.  Findings:old drain had become withdrawn mostly outside of the gallbladder Complications: None Recommendations:  - To gravity - Dc home - Do not submerge - Routine drain care  - follow up with surgery  Signed,  Dulcy Fanny. Earleen Newport, DO

## 2019-08-17 ENCOUNTER — Inpatient Hospital Stay: Payer: PPO

## 2019-08-17 DIAGNOSIS — R34 Anuria and oliguria: Secondary | ICD-10-CM | POA: Diagnosis not present

## 2019-08-17 DIAGNOSIS — D72829 Elevated white blood cell count, unspecified: Secondary | ICD-10-CM | POA: Diagnosis not present

## 2019-08-17 DIAGNOSIS — R39198 Other difficulties with micturition: Secondary | ICD-10-CM | POA: Diagnosis not present

## 2019-08-19 ENCOUNTER — Telehealth: Payer: Self-pay | Admitting: *Deleted

## 2019-08-19 ENCOUNTER — Inpatient Hospital Stay: Payer: PPO | Attending: Internal Medicine

## 2019-08-19 ENCOUNTER — Other Ambulatory Visit: Payer: Self-pay

## 2019-08-19 DIAGNOSIS — R918 Other nonspecific abnormal finding of lung field: Secondary | ICD-10-CM | POA: Insufficient documentation

## 2019-08-19 DIAGNOSIS — Z79899 Other long term (current) drug therapy: Secondary | ICD-10-CM | POA: Insufficient documentation

## 2019-08-19 DIAGNOSIS — I2510 Atherosclerotic heart disease of native coronary artery without angina pectoris: Secondary | ICD-10-CM | POA: Insufficient documentation

## 2019-08-19 DIAGNOSIS — D631 Anemia in chronic kidney disease: Secondary | ICD-10-CM | POA: Insufficient documentation

## 2019-08-19 DIAGNOSIS — I129 Hypertensive chronic kidney disease with stage 1 through stage 4 chronic kidney disease, or unspecified chronic kidney disease: Secondary | ICD-10-CM | POA: Diagnosis not present

## 2019-08-19 DIAGNOSIS — I429 Cardiomyopathy, unspecified: Secondary | ICD-10-CM | POA: Insufficient documentation

## 2019-08-19 DIAGNOSIS — I252 Old myocardial infarction: Secondary | ICD-10-CM | POA: Insufficient documentation

## 2019-08-19 DIAGNOSIS — Z7982 Long term (current) use of aspirin: Secondary | ICD-10-CM | POA: Diagnosis not present

## 2019-08-19 DIAGNOSIS — D696 Thrombocytopenia, unspecified: Secondary | ICD-10-CM | POA: Insufficient documentation

## 2019-08-19 DIAGNOSIS — D500 Iron deficiency anemia secondary to blood loss (chronic): Secondary | ICD-10-CM

## 2019-08-19 DIAGNOSIS — K810 Acute cholecystitis: Secondary | ICD-10-CM | POA: Diagnosis not present

## 2019-08-19 DIAGNOSIS — F419 Anxiety disorder, unspecified: Secondary | ICD-10-CM | POA: Insufficient documentation

## 2019-08-19 DIAGNOSIS — I509 Heart failure, unspecified: Secondary | ICD-10-CM | POA: Insufficient documentation

## 2019-08-19 DIAGNOSIS — Z87891 Personal history of nicotine dependence: Secondary | ICD-10-CM | POA: Diagnosis not present

## 2019-08-19 DIAGNOSIS — N183 Chronic kidney disease, stage 3 unspecified: Secondary | ICD-10-CM

## 2019-08-19 MED ORDER — DARBEPOETIN ALFA 300 MCG/0.6ML IJ SOSY
300.0000 ug | PREFILLED_SYRINGE | Freq: Once | INTRAMUSCULAR | Status: AC
  Administered 2019-08-19: 300 ug via SUBCUTANEOUS
  Filled 2019-08-19: qty 0.6

## 2019-08-19 NOTE — Telephone Encounter (Signed)
Daughter called requesting to bring patient in this afternoon for the injection he missed while in the hospital. He had labs drawn at PCP office yesterday and his HGB is 8.2 and he feels weak. Please advise

## 2019-08-19 NOTE — Telephone Encounter (Signed)
Per Dr. Rogue Bussing - ok to add patient to schedule today for aranesp.

## 2019-08-19 NOTE — Telephone Encounter (Signed)
Appointment accepted for 230 this afternoon, Lab informed

## 2019-08-25 DIAGNOSIS — K810 Acute cholecystitis: Secondary | ICD-10-CM | POA: Diagnosis not present

## 2019-08-31 ENCOUNTER — Inpatient Hospital Stay: Payer: PPO

## 2019-08-31 ENCOUNTER — Other Ambulatory Visit: Payer: Self-pay

## 2019-08-31 ENCOUNTER — Inpatient Hospital Stay (HOSPITAL_BASED_OUTPATIENT_CLINIC_OR_DEPARTMENT_OTHER): Payer: PPO | Admitting: Internal Medicine

## 2019-08-31 DIAGNOSIS — N183 Chronic kidney disease, stage 3 unspecified: Secondary | ICD-10-CM

## 2019-08-31 DIAGNOSIS — D631 Anemia in chronic kidney disease: Secondary | ICD-10-CM | POA: Diagnosis not present

## 2019-08-31 DIAGNOSIS — I129 Hypertensive chronic kidney disease with stage 1 through stage 4 chronic kidney disease, or unspecified chronic kidney disease: Secondary | ICD-10-CM | POA: Diagnosis not present

## 2019-08-31 DIAGNOSIS — D500 Iron deficiency anemia secondary to blood loss (chronic): Secondary | ICD-10-CM

## 2019-08-31 LAB — CBC WITH DIFFERENTIAL/PLATELET
Abs Immature Granulocytes: 0.11 10*3/uL — ABNORMAL HIGH (ref 0.00–0.07)
Basophils Absolute: 0 10*3/uL (ref 0.0–0.1)
Basophils Relative: 0 %
Eosinophils Absolute: 0 10*3/uL (ref 0.0–0.5)
Eosinophils Relative: 0 %
HCT: 29.3 % — ABNORMAL LOW (ref 39.0–52.0)
Hemoglobin: 9.2 g/dL — ABNORMAL LOW (ref 13.0–17.0)
Immature Granulocytes: 1 %
Lymphocytes Relative: 21 %
Lymphs Abs: 1.9 10*3/uL (ref 0.7–4.0)
MCH: 33.3 pg (ref 26.0–34.0)
MCHC: 31.4 g/dL (ref 30.0–36.0)
MCV: 106.2 fL — ABNORMAL HIGH (ref 80.0–100.0)
Monocytes Absolute: 2.2 10*3/uL — ABNORMAL HIGH (ref 0.1–1.0)
Monocytes Relative: 26 %
Neutro Abs: 4.5 10*3/uL (ref 1.7–7.7)
Neutrophils Relative %: 52 %
Platelets: 160 10*3/uL (ref 150–400)
RBC: 2.76 MIL/uL — ABNORMAL LOW (ref 4.22–5.81)
RDW: 19.1 % — ABNORMAL HIGH (ref 11.5–15.5)
WBC: 8.7 10*3/uL (ref 4.0–10.5)
nRBC: 0 % (ref 0.0–0.2)

## 2019-08-31 LAB — IRON AND TIBC
Iron: 74 ug/dL (ref 45–182)
Saturation Ratios: 22 % (ref 17.9–39.5)
TIBC: 339 ug/dL (ref 250–450)
UIBC: 265 ug/dL

## 2019-08-31 LAB — BASIC METABOLIC PANEL
Anion gap: 11 (ref 5–15)
BUN: 27 mg/dL — ABNORMAL HIGH (ref 8–23)
CO2: 22 mmol/L (ref 22–32)
Calcium: 8.6 mg/dL — ABNORMAL LOW (ref 8.9–10.3)
Chloride: 107 mmol/L (ref 98–111)
Creatinine, Ser: 1.65 mg/dL — ABNORMAL HIGH (ref 0.61–1.24)
GFR calc Af Amer: 43 mL/min — ABNORMAL LOW (ref >60)
GFR calc non Af Amer: 37 mL/min — ABNORMAL LOW (ref >60)
Glucose, Bld: 130 mg/dL — ABNORMAL HIGH (ref 70–99)
Potassium: 4 mmol/L (ref 3.5–5.1)
Sodium: 140 mmol/L (ref 135–145)

## 2019-08-31 LAB — LACTATE DEHYDROGENASE: LDH: 148 U/L (ref 98–192)

## 2019-08-31 LAB — FERRITIN: Ferritin: 337 ng/mL — ABNORMAL HIGH (ref 24–336)

## 2019-08-31 MED ORDER — DARBEPOETIN ALFA 300 MCG/0.6ML IJ SOSY
300.0000 ug | PREFILLED_SYRINGE | Freq: Once | INTRAMUSCULAR | Status: AC
  Administered 2019-08-31: 11:00:00 300 ug via SUBCUTANEOUS
  Filled 2019-08-31: qty 0.6

## 2019-08-31 NOTE — Progress Notes (Signed)
[place] NOTE  Patient Care Team: Rusty Aus, MD as PCP - General (Internal Medicine) Alisa Graff, FNP as Nurse Practitioner (Family Medicine) Ubaldo Glassing Javier Docker, MD as Consulting Physician (Cardiology) Erby Pian, MD as Referring Physician (Specialist) Cammie Sickle, MD as Consulting Physician (Hematology and Oncology)  CHIEF COMPLAINTS/PURPOSE OF CONSULTATION:   # 2012- Anemia 11-12- sec to CKD/IDA? MDS [no BMBx] [March 2017-sat-11%; ferritin-42] [Feb 2017- EGD/colo-July 2015 Dr.Skulskie];April 2017-PO Iron PO; May 2020-IV iron/Aranesp.  # Intermittent thrombocytopenia 120s-140s [Dec 2016-CT- neg cirrhosis/splenomegaly]  #June 2020 -LUL cystic lesion- 4x4cm ?  Question etiology [slightly increased from 2017]; question postinflammatory versus low-grade adenocarcinoma-repeat scan in 6 months [December 2020]  # CKD [creatinine 1.2- 1.4]; CAD/CHF; March 2021-acalculous cholecystitis/sepsis [ICU]; s/p cholecystostomy tube placement  HISTORY OF PRESENTING ILLNESS:  Andres Gutierrez 86 y.o.  male history of above history of chronic anemia and intermittent thrombocytopenia is here for follow-up/ currently on IV iron and also Aranesp.  Patient was recently in the hospital for a calculus cholecystitis/sepsis nicely.  Treated with antibiotics.  Given his multiple comorbidities age surgery was not done.  Patient had cholecystostomy tube placed.  Was discharged; follow-up with general surgery Dr. Mitzi Davenport awaiting clearance from specialist to proceed with definitive surgery.   Denies any abdominal pain.  No nausea no vomiting.   Patient states that he wants to have his cholecystostomy tube taken out.  Awaiting clearance from the specialists.   Review of Systems  Constitutional: Positive for malaise/fatigue. Negative for chills, diaphoresis, fever and weight loss.  HENT: Negative for nosebleeds and sore throat.   Eyes: Negative for double  vision.  Respiratory: Negative for cough, hemoptysis, sputum production, shortness of breath and wheezing.   Cardiovascular: Negative for chest pain, palpitations, orthopnea and leg swelling.  Gastrointestinal: Negative for abdominal pain, blood in stool, constipation, diarrhea, heartburn, melena, nausea and vomiting.  Genitourinary: Negative for dysuria, frequency and urgency.  Musculoskeletal: Positive for back pain and joint pain.  Skin: Negative.  Negative for itching and rash.  Neurological: Negative for dizziness, tingling, focal weakness, weakness and headaches.  Endo/Heme/Allergies: Does not bruise/bleed easily.  Psychiatric/Behavioral: Negative for depression. The patient is not nervous/anxious and does not have insomnia.      MEDICAL HISTORY:  Past Medical History:  Diagnosis Date  . Anemia   . Anxiety   . Cardiomyopathy ([place])   . CHF (congestive heart failure) ([place])   . Chronic kidney disease    kidney stones  . Coronary artery disease   . Cough   . Hypertension   . Lymphadenopathy, hilar   . MI (myocardial infarction) ([place])    x 2  . Wheezing     SURGICAL HISTORY: Past Surgical History:  Procedure Laterality Date  . BRONCHIAL NEEDLE ASPIRATION BIOPSY N/A 12/31/2014   Procedure: BRONCHIAL NEEDLE ASPIRATION BIOPSIES from carina;  Surgeon: Flora Lipps, MD;  Location: [facility] ORS;  Service: Cardiopulmonary;  Laterality: N/A;  . CARDIAC CATHETERIZATION N/A 12/28/2015   Procedure: Left Heart Cath and Cors/Grafts Angiography;  Surgeon: Yolonda Kida, MD;  Location: [place] [facility];  Service: Cardiovascular;  Laterality: N/A;  . CATARACT EXTRACTION W/PHACO Left 08/01/2016   Procedure: CATARACT EXTRACTION PHACO AND INTRAOCULAR LENS PLACEMENT ([place]) Left;  Surgeon: Leandrew Koyanagi, MD;  Location: [place];  Service: Ophthalmology;  Laterality: Left;  . CORONARY ANGIOPLASTY WITH STENT PLACEMENT    . CORONARY ARTERY BYPASS GRAFT    . ENDOBRONCHIAL ULTRASOUND  N/A 12/31/2014  Procedure: ENDOBRONCHIAL ULTRASOUND;  Surgeon: Flora Lipps, MD;  Location: [facility];  Service: Cardiopulmonary;  Laterality: N/A;  . ESOPHAGOGASTRODUODENOSCOPY (EGD) WITH PROPOFOL N/A 06/20/2015   Procedure: ESOPHAGOGASTRODUODENOSCOPY (EGD) WITH PROPOFOL;  Surgeon: Lollie Sails, MD;  Location: [facility] END[facility];  Service: Endoscopy;  Laterality: N/A;  . IR CHOLANGIOGRAM EXISTING TUBE  08/13/2019    SOCIAL HISTORY: lives in [place]; alone. 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  Social History   Socioeconomic History  . Marital status: Widowed    Spouse name: Not on file  . Number of children: 2  . Years of education: college  . Highest education level: Some college, no degree  Occupational History  . Occupation: retired  Tobacco Use  . Smoking status: Former Smoker    Quit date: 06/27/1977    Years since quitting: 42.2  . Smokeless tobacco: Current User    Types: Chew  Substance and Sexual Activity  . Alcohol use: Yes    Comment: occational almost rare once a year  . Drug use: No  . Sexual activity: Never    Birth control/protection: Abstinence  Other Topics Concern  . Not on file  Social History Narrative  . Not on file   Social Determinants of Health   Financial Resource Strain:   . Difficulty of Paying Living Expenses:   Food Insecurity:   . Worried About Charity fundraiser in the Last Year:   . Arboriculturist in the Last Year:   Transportation Needs:   . Film/video editor (Medical):   Marland Kitchen Lack of Transportation (Non-Medical):   Physical Activity:   . Days of Exercise per Week:   . Minutes of Exercise per Session:   Stress:   . Feeling of Stress :   Social Connections:   . Frequency of Communication with Friends and Family:   . Frequency of Social Gatherings with Friends and Family:   . Attends Religious Services:   . Active Member of Clubs or Organizations:   . Attends Archivist Meetings:   Marland Kitchen Marital Status:   Intimate Partner Violence:   .  Fear of Current or Ex-Partner:   . Emotionally Abused:   Marland Kitchen Physically Abused:   . Sexually Abused:     FAMILY HISTORY: Family History  Problem Relation Age of Onset  . CAD Mother   . Colon cancer Father     ALLERGIES:  is allergic to levaquin [levofloxacin]; 5ht3 receptor antagonists; diphenhydramine hcl; escitalopram; maxidex [dexamethasone]; prednisone; serotonin; vytorin [ezetimibe-simvastatin]; zocor [simvastatin]; and diltiazem.  MEDICATIONS:  Current Outpatient Medications  Medication Sig Dispense Refill  . acetaminophen (TYLENOL) 500 MG tablet Take 500-1,000 mg by mouth every 6 (six) hours as needed for mild pain, moderate pain or fever.     Marland Kitchen aspirin EC 81 MG EC tablet Take 1 tablet (81 mg total) by mouth daily.    Marland Kitchen atorvastatin (LIPITOR) 40 MG tablet Take 1 tablet (40 mg total) by mouth daily at 6 PM. 30 tablet 0  . digoxin (LANOXIN) 0.125 MG tablet Take 0.0625 mg by mouth daily.    Marland Kitchen docusate sodium (COLACE) 100 MG capsule Take 100 mg by mouth daily as needed for mild constipation.     . famotidine (PEPCID) 20 MG tablet Take 20 mg by mouth at bedtime.    . ferrous sulfate 325 (65 FE) MG tablet Take 1 tablet (325 mg total) by mouth every other day. (Patient taking differently: Take 325 mg by mouth every Monday, Wednesday, and Friday. )  30 tablet 3  . furosemide (LASIX) 40 MG tablet Take 1 tablet (40 mg total) by mouth daily. (Patient taking differently: Take 20 mg by mouth daily. ) 30 tablet 3  . HYDROcodone-acetaminophen (NORCO/VICODIN) 5-325 MG tablet Take 1 tablet by mouth every 6 (six) hours as needed for moderate pain. 15 tablet 0  . metoprolol tartrate (LOPRESSOR) 25 MG tablet Take 0.5 tablets (12.5 mg total) by mouth 2 (two) times daily. 30 tablet 0  . nitroGLYCERIN (NITROSTAT) 0.4 MG SL tablet Place 0.4 mg under the tongue every 5 (five) minutes as needed for chest pain.    Marland Kitchen omeprazole (PRILOSEC) 20 MG capsule Take 1 capsule (20 mg total) by mouth daily. 30 capsule 1   . polyethylene glycol (MIRALAX / GLYCOLAX) 17 g packet Take 17 g by mouth daily as needed for mild constipation. 14 each 0  . ranolazine (RANEXA) 1000 MG SR tablet Take 500 mg by mouth 2 (two) times daily.     . sucralfate (CARAFATE) 1 G tablet Take 1 g by mouth 2 (two) times daily.     . tamsulosin (FLOMAX) 0.4 MG CAPS capsule Take 0.4 mg by mouth daily after supper.     . vitamin B-12 (CYANOCOBALAMIN) 500 MCG tablet Take 500 mcg by mouth daily.    . vitamin C (VITAMIN C) 250 MG tablet Take 1 tablet (250 mg total) by mouth daily. 30 tablet 0   No current facility-administered medications for this visit.     PHYSICAL EXAMINATION: ECOG PERFORMANCE STATUS: 0 - Asymptomatic  Vitals:   08/31/19 1036  BP: 122/64  Pulse: 72  Resp: 16  Temp: (!) 96.8 F (36 C)  SpO2: 95%   Filed Weights   08/31/19 1036  Weight: 193 lb 6.4 oz (87.7 kg)    Physical Exam  Constitutional: He is oriented to person, place, and time.  Elderly Caucasian male patient. He accompanied by his son; patient in wheelchair.   HENT:  Head: Normocephalic and atraumatic.  Mouth/Throat: Oropharynx is clear and moist. No oropharyngeal exudate.  Eyes: Pupils are equal, round, and reactive to light.  Cardiovascular: Normal rate and regular rhythm.  Pulmonary/Chest: Breath sounds normal. No respiratory distress. He has no wheezes.  Abdominal: Soft. Bowel sounds are normal. He exhibits no distension and no mass. There is no abdominal tenderness. There is no rebound and no guarding.  Musculoskeletal:        General: No tenderness or edema. Normal range of motion.     Cervical back: Normal range of motion and neck supple.  Neurological: He is alert and oriented to person, place, and time.  Skin: Skin is warm.  Psychiatric: Affect normal.   .   LABORATORY DATA:  I have reviewed the data as listed Lab Results  Component Value Date   WBC 8.7 08/31/2019   HGB 9.2 (L) 08/31/2019   HCT 29.3 (L) 08/31/2019   MCV 106.2  (H) 08/31/2019   PLT 160 08/31/2019   Recent Labs    01/07/19 0711 01/08/19 0428 06/16/19 1419 06/22/19 1003 07/29/19 0825 07/29/19 0825 07/29/19 2314 07/30/19 0224 07/31/19 0413 07/31/19 0413 08/01/19 0444 08/02/19 0459 08/31/19 1007  NA 140   < > 136   < > 140   < > 138   < > 135  136   < > 138 137 140  K 4.3   < > 4.1   < > 4.2   < > 3.9   < > 3.9  3.8   < >  3.6 3.6 4.0  CL 109   < > 104   < > 104   < > 106   < > 104  104   < > 107 107 107  CO2 21*   < > 21*   < > 23   < > 24   < > 19*  21*   < > 22 23 22   GLUCOSE 99   < > 141*   < > 122*   < > 108*   < > 101*  108*   < > 113* 105* 130*  BUN 24*   < > 56*   < > 30*   < > 27*   < > 32*  32*   < > 31* 29* 27*  CREATININE 1.51*   < > 2.18*   < > 1.95*   < > 1.95*   < > 1.85*  1.87*   < > 1.69* 1.53* 1.65*  CALCIUM 8.8*   < > 9.4   < > 8.9   < > 8.5*   < > 7.7*  7.8*   < > 7.8* 7.7* 8.6*  GFRNONAA 42*   < > 26*   < > 30*   < > 30*   < > 32*  32*   < > 36* 41* 37*  GFRAA 48*   < > 31*   < > 35*   < > 35*   < > 37*  37*   < > 42* 47* 43*  PROT 7.8   < > 7.9  --  7.4  --  6.8  --   --   --   --   --   --   ALBUMIN 3.8   < > 4.7  --  4.0  --  3.8  --  3.1*  --   --   --   --   AST 21   < > 20  --  20  --  36  --   --   --   --   --   --   ALT 15   < > 25  --  19  --  18  --   --   --   --   --   --   ALKPHOS 59   < > 64  --  58  --  55  --   --   --   --   --   --   BILITOT 0.9   < > 1.1  --  1.3*  --  1.9*  --   --   --   --   --   --   BILIDIR 0.2  --   --   --   --   --   --   --   --   --   --   --   --   IBILI 0.7  --   --   --   --   --   --   --   --   --   --   --   --    < > = values in this interval not displayed.   ASSESSMENT & PLAN:   Anemia of chronic kidney failure, stage 3 (moderate) ([place]) # Anemia-mild.  Since 2012.  Suspect MDS/chronic kidney disease.  No bone marrow biopsy.  Stable.  # Hemoglobin 9.7;proceed with today.  Proceed with Aranesp today.  Continue p.o. iron.  Awaiting  iron studies today.   Discussed if significantly low would recommend IV iron.  #Recent leukocytosis- [sepsis/cholecystitis]-today white count is normal.  Suspect leukocytosis related to underlying infection.  # Mild intermittent thrombocytopenia-question ITP [nadir November 2020-80s].  Today platelets 184- ok to with asprin.  Stable  #Cholecystitis acute s/p cholecystostomy tube; awaiting definitive cholecystectomy.  Patient okay to proceed with cholecystectomy from hematology standpoint.  Awaiting cardiopulmonary clearance.  #Recent acute CHF-2025% ejection fraction [[facility]]-see below ;on  Lasix 20 mg a day [half pill once a day]; followed by cardiology/PCP this week  #CKD stage III-stable  #Left upper lobe cystic lesion-incidentally found on imaging with [facility]; December 2020 CT scan-left upper lobe cystic lesion stable; nodular right posterior lesion-atelectasis versus others-we will plan surveillance imaging.   #Disposition: #  Proceed with Aranesp # in 2 weeks- cbc possible aranesp # # in 4 weeks- cbc possible aranesp # in 6 weeks MD- cbc/bmp/LDH/possible aranesp-Dr.B      Cammie Sickle, MD 08/31/2019 12:56 PM

## 2019-08-31 NOTE — Assessment & Plan Note (Addendum)
#  Anemia-mild.  Since 2012.  Suspect MDS/chronic kidney disease.  No bone marrow biopsy.  Stable.  # Hemoglobin 9.7;proceed with today.  Proceed with Aranesp today.  Continue p.o. iron.  Awaiting iron studies today.  Discussed if significantly low would recommend IV iron.  #Recent leukocytosis- [sepsis/cholecystitis]-today white count is normal.  Suspect leukocytosis related to underlying infection.  # Mild intermittent thrombocytopenia-question ITP [nadir November 2020-80s].  Today platelets 184- ok to with asprin.  Stable  #Cholecystitis acute s/p cholecystostomy tube; awaiting definitive cholecystectomy.  Patient okay to proceed with cholecystectomy from hematology standpoint.  Awaiting cardiopulmonary clearance.  #Recent acute CHF-2025% ejection fraction [KC]-see below ;on  Lasix 20 mg a day [half pill once a day]; followed by cardiology/PCP this week  #CKD stage III-stable  #Left upper lobe cystic lesion-incidentally found on imaging with [facility]; December 2020 CT scan-left upper lobe cystic lesion stable; nodular right posterior lesion-atelectasis versus others-we will plan surveillance imaging.   #Disposition: #  Proceed with Aranesp # in 2 weeks- cbc possible aranesp # # in 4 weeks- cbc possible aranesp # in 6 weeks MD- cbc/bmp/LDH/possible aranesp-Dr.B 

## 2019-08-31 NOTE — Progress Notes (Signed)
Patient here for followup appointment, has no complaints. No changes. Son assisted with admission questions.

## 2019-09-01 ENCOUNTER — Telehealth: Payer: Self-pay | Admitting: Primary Care

## 2019-09-01 NOTE — Telephone Encounter (Signed)
Called patient's daughter Tiana Loft, to schedule the Palliative Consult, no answer - left message with reason for call along with my name and contact number

## 2019-09-02 NOTE — Telephone Encounter (Signed)
Returned call to patient's daughter [name], and after discussing Palliative services with her she wanted to discuss this with her brother first before scheduling an appointment.  Daughter is to call me back with their decision.

## 2019-09-03 DIAGNOSIS — I5023 Acute on chronic systolic (congestive) heart failure: Secondary | ICD-10-CM | POA: Diagnosis not present

## 2019-09-03 DIAGNOSIS — I252 Old myocardial infarction: Secondary | ICD-10-CM | POA: Diagnosis not present

## 2019-09-03 DIAGNOSIS — I130 Hypertensive heart and chronic kidney disease with heart failure and stage 1 through stage 4 chronic kidney disease, or unspecified chronic kidney disease: Secondary | ICD-10-CM | POA: Diagnosis not present

## 2019-09-03 DIAGNOSIS — K8000 Calculus of gallbladder with acute cholecystitis without obstruction: Secondary | ICD-10-CM | POA: Diagnosis not present

## 2019-09-03 DIAGNOSIS — N183 Chronic kidney disease, stage 3 unspecified: Secondary | ICD-10-CM | POA: Diagnosis not present

## 2019-09-03 DIAGNOSIS — I2510 Atherosclerotic heart disease of native coronary artery without angina pectoris: Secondary | ICD-10-CM | POA: Diagnosis not present

## 2019-09-03 DIAGNOSIS — Z4803 Encounter for change or removal of drains: Secondary | ICD-10-CM | POA: Diagnosis not present

## 2019-09-08 DIAGNOSIS — Z01810 Encounter for preprocedural cardiovascular examination: Secondary | ICD-10-CM | POA: Diagnosis not present

## 2019-09-08 DIAGNOSIS — Z1331 Encounter for screening for depression: Secondary | ICD-10-CM | POA: Diagnosis not present

## 2019-09-08 DIAGNOSIS — E440 Moderate protein-calorie malnutrition: Secondary | ICD-10-CM | POA: Diagnosis not present

## 2019-09-08 DIAGNOSIS — N1831 Chronic kidney disease, stage 3a: Secondary | ICD-10-CM | POA: Diagnosis not present

## 2019-09-08 DIAGNOSIS — D696 Thrombocytopenia, unspecified: Secondary | ICD-10-CM | POA: Diagnosis not present

## 2019-09-14 ENCOUNTER — Other Ambulatory Visit: Payer: PPO

## 2019-09-14 ENCOUNTER — Ambulatory Visit: Payer: PPO

## 2019-09-14 DIAGNOSIS — L97512 Non-pressure chronic ulcer of other part of right foot with fat layer exposed: Secondary | ICD-10-CM | POA: Diagnosis not present

## 2019-09-14 DIAGNOSIS — L97521 Non-pressure chronic ulcer of other part of left foot limited to breakdown of skin: Secondary | ICD-10-CM | POA: Diagnosis not present

## 2019-09-15 ENCOUNTER — Other Ambulatory Visit: Payer: PPO

## 2019-09-15 ENCOUNTER — Ambulatory Visit: Payer: PPO

## 2019-09-15 DIAGNOSIS — E782 Mixed hyperlipidemia: Secondary | ICD-10-CM | POA: Diagnosis not present

## 2019-09-15 DIAGNOSIS — I25810 Atherosclerosis of coronary artery bypass graft(s) without angina pectoris: Secondary | ICD-10-CM | POA: Diagnosis not present

## 2019-09-15 DIAGNOSIS — I10 Essential (primary) hypertension: Secondary | ICD-10-CM | POA: Diagnosis not present

## 2019-09-15 DIAGNOSIS — I482 Chronic atrial fibrillation, unspecified: Secondary | ICD-10-CM | POA: Diagnosis not present

## 2019-09-15 DIAGNOSIS — I255 Ischemic cardiomyopathy: Secondary | ICD-10-CM | POA: Diagnosis not present

## 2019-09-16 ENCOUNTER — Other Ambulatory Visit: Payer: Self-pay

## 2019-09-16 ENCOUNTER — Inpatient Hospital Stay: Payer: PPO | Attending: Internal Medicine

## 2019-09-16 ENCOUNTER — Telehealth: Payer: Self-pay | Admitting: Primary Care

## 2019-09-16 ENCOUNTER — Inpatient Hospital Stay: Payer: PPO

## 2019-09-16 VITALS — BP 119/65 | HR 86

## 2019-09-16 DIAGNOSIS — D631 Anemia in chronic kidney disease: Secondary | ICD-10-CM | POA: Insufficient documentation

## 2019-09-16 DIAGNOSIS — I129 Hypertensive chronic kidney disease with stage 1 through stage 4 chronic kidney disease, or unspecified chronic kidney disease: Secondary | ICD-10-CM | POA: Insufficient documentation

## 2019-09-16 DIAGNOSIS — D500 Iron deficiency anemia secondary to blood loss (chronic): Secondary | ICD-10-CM

## 2019-09-16 DIAGNOSIS — N183 Chronic kidney disease, stage 3 unspecified: Secondary | ICD-10-CM | POA: Insufficient documentation

## 2019-09-16 DIAGNOSIS — Z79899 Other long term (current) drug therapy: Secondary | ICD-10-CM | POA: Diagnosis not present

## 2019-09-16 LAB — CBC WITH DIFFERENTIAL/PLATELET
Abs Immature Granulocytes: 0.11 10*3/uL — ABNORMAL HIGH (ref 0.00–0.07)
Basophils Absolute: 0 10*3/uL (ref 0.0–0.1)
Basophils Relative: 0 %
Eosinophils Absolute: 0 10*3/uL (ref 0.0–0.5)
Eosinophils Relative: 0 %
HCT: 30.9 % — ABNORMAL LOW (ref 39.0–52.0)
Hemoglobin: 9.7 g/dL — ABNORMAL LOW (ref 13.0–17.0)
Immature Granulocytes: 1 %
Lymphocytes Relative: 19 %
Lymphs Abs: 2.1 10*3/uL (ref 0.7–4.0)
MCH: 32.8 pg (ref 26.0–34.0)
MCHC: 31.4 g/dL (ref 30.0–36.0)
MCV: 104.4 fL — ABNORMAL HIGH (ref 80.0–100.0)
Monocytes Absolute: 2.5 10*3/uL — ABNORMAL HIGH (ref 0.1–1.0)
Monocytes Relative: 22 %
Neutro Abs: 6.4 10*3/uL (ref 1.7–7.7)
Neutrophils Relative %: 58 %
Platelets: 137 10*3/uL — ABNORMAL LOW (ref 150–400)
RBC: 2.96 MIL/uL — ABNORMAL LOW (ref 4.22–5.81)
RDW: 18.1 % — ABNORMAL HIGH (ref 11.5–15.5)
WBC: 11.1 10*3/uL — ABNORMAL HIGH (ref 4.0–10.5)
nRBC: 0 % (ref 0.0–0.2)

## 2019-09-16 MED ORDER — DARBEPOETIN ALFA 300 MCG/0.6ML IJ SOSY
300.0000 ug | PREFILLED_SYRINGE | Freq: Once | INTRAMUSCULAR | Status: AC
  Administered 2019-09-16: 11:00:00 300 ug via SUBCUTANEOUS
  Filled 2019-09-16: qty 0.6

## 2019-09-16 NOTE — Telephone Encounter (Signed)
Spoke with daughter, Grant Fontana, to f/u with her to see if they had decided to proceed with scheduling the Palliative Consult and they had declined Palliative services at this time and said that she would contact MD office if they decided to pursue Palliative services.  Will notify Dr. Ammie Ferrier office.

## 2019-09-22 ENCOUNTER — Ambulatory Visit: Payer: PPO

## 2019-09-22 ENCOUNTER — Other Ambulatory Visit: Payer: PPO

## 2019-09-22 DIAGNOSIS — I25810 Atherosclerosis of coronary artery bypass graft(s) without angina pectoris: Secondary | ICD-10-CM | POA: Diagnosis not present

## 2019-09-23 ENCOUNTER — Ambulatory Visit: Payer: PPO

## 2019-09-23 ENCOUNTER — Other Ambulatory Visit: Payer: PPO

## 2019-09-25 ENCOUNTER — Telehealth: Payer: Self-pay | Admitting: *Deleted

## 2019-09-25 DIAGNOSIS — I25810 Atherosclerosis of coronary artery bypass graft(s) without angina pectoris: Secondary | ICD-10-CM | POA: Diagnosis not present

## 2019-09-25 DIAGNOSIS — I482 Chronic atrial fibrillation, unspecified: Secondary | ICD-10-CM | POA: Diagnosis not present

## 2019-09-25 DIAGNOSIS — I255 Ischemic cardiomyopathy: Secondary | ICD-10-CM | POA: Diagnosis not present

## 2019-09-25 DIAGNOSIS — I10 Essential (primary) hypertension: Secondary | ICD-10-CM | POA: Diagnosis not present

## 2019-09-25 DIAGNOSIS — E782 Mixed hyperlipidemia: Secondary | ICD-10-CM | POA: Diagnosis not present

## 2019-09-25 DIAGNOSIS — D696 Thrombocytopenia, unspecified: Secondary | ICD-10-CM | POA: Diagnosis not present

## 2019-09-25 DIAGNOSIS — I951 Orthostatic hypotension: Secondary | ICD-10-CM | POA: Diagnosis not present

## 2019-09-25 DIAGNOSIS — I5022 Chronic systolic (congestive) heart failure: Secondary | ICD-10-CM | POA: Diagnosis not present

## 2019-09-25 NOTE — Telephone Encounter (Signed)
Spoke with daughter [name]. Pt has been cleared by pcp and cardiologist. Daughter will call our office back with the procedure date or will let us know by Tuesday when her dad as an apt for next aranesp.  Daughter wanted to make sure no further interventions needed to be done for hgb prior to surgery.

## 2019-09-25 NOTE — Telephone Encounter (Signed)
Patient to have cholecystectomy and has already received Phone call and Cardiac clearance. Daughter asking if he needs anything from our standpoint prior to surgery. Please Andres Gutierrez

## 2019-09-28 ENCOUNTER — Ambulatory Visit: Payer: PPO

## 2019-09-28 ENCOUNTER — Other Ambulatory Visit: Payer: PPO

## 2019-09-28 DIAGNOSIS — L97512 Non-pressure chronic ulcer of other part of right foot with fat layer exposed: Secondary | ICD-10-CM | POA: Diagnosis not present

## 2019-09-28 DIAGNOSIS — L97521 Non-pressure chronic ulcer of other part of left foot limited to breakdown of skin: Secondary | ICD-10-CM | POA: Diagnosis not present

## 2019-09-30 ENCOUNTER — Inpatient Hospital Stay: Payer: PPO

## 2019-09-30 ENCOUNTER — Other Ambulatory Visit: Payer: Self-pay

## 2019-09-30 ENCOUNTER — Other Ambulatory Visit: Payer: Self-pay | Admitting: *Deleted

## 2019-09-30 DIAGNOSIS — N183 Chronic kidney disease, stage 3 unspecified: Secondary | ICD-10-CM

## 2019-09-30 DIAGNOSIS — I129 Hypertensive chronic kidney disease with stage 1 through stage 4 chronic kidney disease, or unspecified chronic kidney disease: Secondary | ICD-10-CM | POA: Diagnosis not present

## 2019-09-30 DIAGNOSIS — D500 Iron deficiency anemia secondary to blood loss (chronic): Secondary | ICD-10-CM

## 2019-09-30 LAB — CBC WITH DIFFERENTIAL/PLATELET
Abs Immature Granulocytes: 0.2 10*3/uL — ABNORMAL HIGH (ref 0.00–0.07)
Basophils Absolute: 0.1 10*3/uL (ref 0.0–0.1)
Basophils Relative: 1 %
Eosinophils Absolute: 0 10*3/uL (ref 0.0–0.5)
Eosinophils Relative: 0 %
HCT: 34.6 % — ABNORMAL LOW (ref 39.0–52.0)
Hemoglobin: 11.2 g/dL — ABNORMAL LOW (ref 13.0–17.0)
Lymphocytes Relative: 20 %
Lymphs Abs: 2.3 10*3/uL (ref 0.7–4.0)
MCH: 33.2 pg (ref 26.0–34.0)
MCHC: 32.4 g/dL (ref 30.0–36.0)
MCV: 102.7 fL — ABNORMAL HIGH (ref 80.0–100.0)
Monocytes Absolute: 2.1 10*3/uL — ABNORMAL HIGH (ref 0.1–1.0)
Monocytes Relative: 18 %
Myelocytes: 2 %
Neutro Abs: 6.8 10*3/uL (ref 1.7–7.7)
Neutrophils Relative %: 59 %
Platelets: 178 10*3/uL (ref 150–400)
RBC: 3.37 MIL/uL — ABNORMAL LOW (ref 4.22–5.81)
RDW: 17.9 % — ABNORMAL HIGH (ref 11.5–15.5)
Smear Review: ADEQUATE
WBC: 11.5 10*3/uL — ABNORMAL HIGH (ref 4.0–10.5)
nRBC: 0 % (ref 0.0–0.2)

## 2019-10-01 ENCOUNTER — Ambulatory Visit: Payer: Self-pay | Admitting: Surgery

## 2019-10-01 NOTE — H&P (Signed)
Subjective:   CC: Acute cholecystitis [K81.0]   HPI:  Andres Gutierrez is a 86 y.o. male who is here for followup from above.  Drain replaced and still continuing to drain. No N/V.       Current Medications: has a current medication list which includes the following prescription(s): acetaminophen, ascorbic acid (vitamin c), atorvastatin, cyanocobalamin, digoxin, docusate, famotidine, ferrous sulfate, furosemide, metoprolol tartrate, multivitamin, nitroglycerin, omeprazole, pantoprazole, polyethylene glycol, ranolazine, sucralfate, tamsulosin, trelegy ellipta, and oxycodone-acetaminophen.  Allergies:       Allergies  Allergen Reactions  . Levofloxacin Unknown    [name], her daughter, said Levaquin should be avoided because when patient had Levaquin a few years ago, he had "a bad reaction".  She said he could not walk and could not lift up his feet.  . Benadryl [Diphenhydramine Hcl] Unknown  . Dexamethasone Other (See Comments)    Reaction:  Unknown   . Diltiazem Rash  . Escitalopram Other (See Comments)    Makes him feel faint  . Maxidex [Dexamethasone Sodium Phosphate] Unknown  . Prednisone Other (See Comments)    Felt crazy and stopped it  . Serotonin 5ht-3 Antagonists Dizziness  . Vytorin 10-10 [Ezetimibe-Simvastatin] Unknown  . Zocor [Simvastatin] Unknown    ROS: Unable to obtain due to baseline mentation    Objective:   BP 135/55   Pulse 67   Ht 193 cm (6\' 4" )   Wt 87.5 kg (193 lb)   BMI 23.49 kg/m   Constitutional :  alert, appears stated age, cooperative and no distress  Gastrointestinal: soft, non-tender; bowel sounds normal; no masses,  no organomegaly.    Musculoskeletal: Steady gait and movement  Skin: Cool and moist, cholecystostomy drain in place, with darler bilious output.  Psychiatric: Normal affect, non-agitated, not confused       LABS:  N/A   RADS: N/A  Assessment:      Acute cholecystitis [K81.0] s/p IR drain  placement, doing well for now. CAD, low EF Pulmonary cyst?   Plan:    Family will like to consider possible surgery.  Discussed the risk of surgery including post-op infxn, seroma, biloma, chronic pain, poor-delayed wound healing, retained gallstone, conversion to open procedure, post-op SBO or ileus, and need for additional procedures to address said risks.  The risks of general anesthetic including MI, CVA, sudden death or even reaction to anesthetic medications also discussed. Alternatives include continued observation.  Benefits include possible symptom relief, prevention of complications including acute cholecystitis, pancreatitis.  Typical post operative recovery of 3-5 days rest, continued pain in area and incision sites, possible loose stools up to 4-6 weeks, also discussed.  ED return precautions given for sudden increase in RUQ pain, with possible accompanying fever, nausea, and/or vomiting.  Family understands the risks, any and all questions were answered to the patient's satisfaction.   Will require full cardiac, IM clearence prior to proceeding.  I explained to them IR placed drain can stay in as needed as long as it continues to drain.  They are to call rads office if any new/recurrent issues with drain in the meantime.    Electronically signed by Benjamine Sprague, DO on 08/25/2019 3:34 PM

## 2019-10-06 DIAGNOSIS — R0600 Dyspnea, unspecified: Secondary | ICD-10-CM | POA: Diagnosis not present

## 2019-10-06 DIAGNOSIS — Z01818 Encounter for other preprocedural examination: Secondary | ICD-10-CM | POA: Diagnosis not present

## 2019-10-13 ENCOUNTER — Other Ambulatory Visit: Payer: PPO

## 2019-10-13 ENCOUNTER — Ambulatory Visit: Payer: PPO

## 2019-10-13 ENCOUNTER — Ambulatory Visit: Payer: PPO | Admitting: Internal Medicine

## 2019-10-14 ENCOUNTER — Inpatient Hospital Stay: Payer: PPO | Attending: Internal Medicine

## 2019-10-14 ENCOUNTER — Inpatient Hospital Stay (HOSPITAL_BASED_OUTPATIENT_CLINIC_OR_DEPARTMENT_OTHER): Payer: PPO | Admitting: Internal Medicine

## 2019-10-14 ENCOUNTER — Inpatient Hospital Stay: Payer: PPO

## 2019-10-14 ENCOUNTER — Encounter: Payer: Self-pay | Admitting: Internal Medicine

## 2019-10-14 ENCOUNTER — Other Ambulatory Visit: Payer: Self-pay

## 2019-10-14 DIAGNOSIS — I129 Hypertensive chronic kidney disease with stage 1 through stage 4 chronic kidney disease, or unspecified chronic kidney disease: Secondary | ICD-10-CM | POA: Insufficient documentation

## 2019-10-14 DIAGNOSIS — I429 Cardiomyopathy, unspecified: Secondary | ICD-10-CM | POA: Diagnosis not present

## 2019-10-14 DIAGNOSIS — R911 Solitary pulmonary nodule: Secondary | ICD-10-CM | POA: Insufficient documentation

## 2019-10-14 DIAGNOSIS — I509 Heart failure, unspecified: Secondary | ICD-10-CM | POA: Insufficient documentation

## 2019-10-14 DIAGNOSIS — Z87891 Personal history of nicotine dependence: Secondary | ICD-10-CM | POA: Diagnosis not present

## 2019-10-14 DIAGNOSIS — F419 Anxiety disorder, unspecified: Secondary | ICD-10-CM | POA: Diagnosis not present

## 2019-10-14 DIAGNOSIS — D631 Anemia in chronic kidney disease: Secondary | ICD-10-CM | POA: Insufficient documentation

## 2019-10-14 DIAGNOSIS — N183 Chronic kidney disease, stage 3 unspecified: Secondary | ICD-10-CM

## 2019-10-14 DIAGNOSIS — I2510 Atherosclerotic heart disease of native coronary artery without angina pectoris: Secondary | ICD-10-CM | POA: Insufficient documentation

## 2019-10-14 DIAGNOSIS — I252 Old myocardial infarction: Secondary | ICD-10-CM | POA: Insufficient documentation

## 2019-10-14 DIAGNOSIS — Z7982 Long term (current) use of aspirin: Secondary | ICD-10-CM | POA: Insufficient documentation

## 2019-10-14 DIAGNOSIS — D696 Thrombocytopenia, unspecified: Secondary | ICD-10-CM | POA: Diagnosis not present

## 2019-10-14 DIAGNOSIS — Z79899 Other long term (current) drug therapy: Secondary | ICD-10-CM | POA: Insufficient documentation

## 2019-10-14 DIAGNOSIS — I130 Hypertensive heart and chronic kidney disease with heart failure and stage 1 through stage 4 chronic kidney disease, or unspecified chronic kidney disease: Secondary | ICD-10-CM | POA: Insufficient documentation

## 2019-10-14 DIAGNOSIS — D500 Iron deficiency anemia secondary to blood loss (chronic): Secondary | ICD-10-CM

## 2019-10-14 DIAGNOSIS — K819 Cholecystitis, unspecified: Secondary | ICD-10-CM | POA: Insufficient documentation

## 2019-10-14 LAB — CBC WITH DIFFERENTIAL/PLATELET
Abs Immature Granulocytes: 0.19 10*3/uL — ABNORMAL HIGH (ref 0.00–0.07)
Basophils Absolute: 0 10*3/uL (ref 0.0–0.1)
Basophils Relative: 0 %
Eosinophils Absolute: 0 10*3/uL (ref 0.0–0.5)
Eosinophils Relative: 0 %
HCT: 32.3 % — ABNORMAL LOW (ref 39.0–52.0)
Hemoglobin: 10.7 g/dL — ABNORMAL LOW (ref 13.0–17.0)
Immature Granulocytes: 2 %
Lymphocytes Relative: 21 %
Lymphs Abs: 2.4 10*3/uL (ref 0.7–4.0)
MCH: 33.6 pg (ref 26.0–34.0)
MCHC: 33.1 g/dL (ref 30.0–36.0)
MCV: 101.6 fL — ABNORMAL HIGH (ref 80.0–100.0)
Monocytes Absolute: 2.9 10*3/uL — ABNORMAL HIGH (ref 0.1–1.0)
Monocytes Relative: 25 %
Neutro Abs: 5.9 10*3/uL (ref 1.7–7.7)
Neutrophils Relative %: 52 %
Platelets: 118 10*3/uL — ABNORMAL LOW (ref 150–400)
RBC: 3.18 MIL/uL — ABNORMAL LOW (ref 4.22–5.81)
RDW: 17 % — ABNORMAL HIGH (ref 11.5–15.5)
WBC: 11.5 10*3/uL — ABNORMAL HIGH (ref 4.0–10.5)
nRBC: 0 % (ref 0.0–0.2)

## 2019-10-14 LAB — BASIC METABOLIC PANEL
Anion gap: 12 (ref 5–15)
BUN: 31 mg/dL — ABNORMAL HIGH (ref 8–23)
CO2: 22 mmol/L (ref 22–32)
Calcium: 9 mg/dL (ref 8.9–10.3)
Chloride: 104 mmol/L (ref 98–111)
Creatinine, Ser: 1.67 mg/dL — ABNORMAL HIGH (ref 0.61–1.24)
GFR calc Af Amer: 42 mL/min — ABNORMAL LOW (ref >60)
GFR calc non Af Amer: 36 mL/min — ABNORMAL LOW (ref >60)
Glucose, Bld: 120 mg/dL — ABNORMAL HIGH (ref 70–99)
Potassium: 3.7 mmol/L (ref 3.5–5.1)
Sodium: 138 mmol/L (ref 135–145)

## 2019-10-14 LAB — LACTATE DEHYDROGENASE: LDH: 152 U/L (ref 98–192)

## 2019-10-14 NOTE — Progress Notes (Signed)
[place] NOTE  Patient Care Team: Rusty Aus, MD as PCP - General (Internal Medicine) Alisa Graff, FNP as Nurse Practitioner (Family Medicine) Ubaldo Glassing Javier Docker, MD as Consulting Physician (Cardiology) Erby Pian, MD as Referring Physician (Specialist) Cammie Sickle, MD as Consulting Physician (Hematology and Oncology)  CHIEF COMPLAINTS/PURPOSE OF CONSULTATION:   # 2012- Anemia 11-12- sec to CKD/IDA? MDS [no BMBx] [March 2017-sat-11%; ferritin-42] [Feb 2017- EGD/colo-July 2015 Dr.Skulskie];April 2017-PO Iron PO; May 2020-IV iron/Aranesp.  # Intermittent thrombocytopenia 120s-140s [Dec 2016-CT- neg cirrhosis/splenomegaly]  #June 2020 -LUL cystic lesion- 4x4cm ?  Question etiology [slightly increased from 2017]; question postinflammatory versus low-grade adenocarcinoma-repeat scan in 6 months [December 2020]  # CKD [creatinine 1.2- 1.4]; CAD/CHF; March 2021-acalculous cholecystitis/sepsis [ICU]; s/p cholecystostomy tube placement  HISTORY OF PRESENTING ILLNESS:  Andres Gutierrez 86 y.o.  male history of above history of chronic anemia and intermittent thrombocytopenia is here for follow-up/ currently on IV iron and also Aranesp.  Patient is currently awaiting definitive cholecystectomy on June 25; he continues to have his cholecystostomy tube placed.  Overall is doing well.  Denies any abdominal pain nausea vomiting.   Review of Systems  Constitutional: Positive for malaise/fatigue. Negative for chills, diaphoresis, fever and weight loss.  HENT: Negative for nosebleeds and sore throat.   Eyes: Negative for double vision.  Respiratory: Negative for cough, hemoptysis, sputum production, shortness of breath and wheezing.   Cardiovascular: Negative for chest pain, palpitations, orthopnea and leg swelling.  Gastrointestinal: Negative for abdominal pain, blood in stool, constipation, diarrhea, heartburn, melena, nausea and vomiting.   Genitourinary: Negative for dysuria, frequency and urgency.  Musculoskeletal: Positive for back pain and joint pain.  Skin: Negative.  Negative for itching and rash.  Neurological: Negative for dizziness, tingling, focal weakness, weakness and headaches.  Endo/Heme/Allergies: Does not bruise/bleed easily.  Psychiatric/Behavioral: Negative for depression. The patient is not nervous/anxious and does not have insomnia.      MEDICAL HISTORY:  Past Medical History:  Diagnosis Date  . Anemia   . Anxiety   . Cardiomyopathy ([place])   . CHF (congestive heart failure) ([place])   . Chronic kidney disease    kidney stones  . Coronary artery disease   . Cough   . Hypertension   . Lymphadenopathy, hilar   . MI (myocardial infarction) ([place])    x 2  . Wheezing     SURGICAL HISTORY: Past Surgical History:  Procedure Laterality Date  . BRONCHIAL NEEDLE ASPIRATION BIOPSY N/A 12/31/2014   Procedure: BRONCHIAL NEEDLE ASPIRATION BIOPSIES from carina;  Surgeon: Flora Lipps, MD;  Location: [facility];  Service: Cardiopulmonary;  Laterality: N/A;  . CARDIAC CATHETERIZATION N/A 12/28/2015   Procedure: Left Heart Cath and Cors/Grafts Angiography;  Surgeon: Yolonda Kida, MD;  Location: [facility];  Service: Cardiovascular;  Laterality: N/A;  . CATARACT EXTRACTION W/PHACO Left 08/01/2016   Procedure: CATARACT EXTRACTION PHACO AND INTRAOCULAR LENS PLACEMENT ([place]) Left;  Surgeon: Leandrew Koyanagi, MD;  Location: [facility];  Service: Ophthalmology;  Laterality: Left;  . CORONARY ANGIOPLASTY WITH STENT PLACEMENT    . CORONARY ARTERY BYPASS GRAFT    . ENDOBRONCHIAL ULTRASOUND N/A 12/31/2014   Procedure: ENDOBRONCHIAL ULTRASOUND;  Surgeon: Flora Lipps, MD;  Location: [facility];  Service: Cardiopulmonary;  Laterality: N/A;  . ESOPHAGOGASTRODUODENOSCOPY (EGD) WITH PROPOFOL N/A 06/20/2015   Procedure: ESOPHAGOGASTRODUODENOSCOPY (EGD) WITH PROPOFOL;  Surgeon: Lollie Sails, MD;  Location: [facility]  ENDOSCOPY;  Service: Endoscopy;  Laterality: N/A;  .  IR CHOLANGIOGRAM EXISTING TUBE  08/13/2019    SOCIAL HISTORY: lives in [place]; alone. 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  Social History   Socioeconomic History  . Marital status: Widowed    Spouse name: Not on file  . Number of children: 2  . Years of education: college  . Highest education level: Some college, no degree  Occupational History  . Occupation: retired  Tobacco Use  . Smoking status: Former Smoker    Quit date: 06/27/1977    Years since quitting: 42.3  . Smokeless tobacco: Current User    Types: Chew  Substance and Sexual Activity  . Alcohol use: Yes    Comment: occational almost rare once a year  . Drug use: No  . Sexual activity: Never    Birth control/protection: Abstinence  Other Topics Concern  . Not on file  Social History Narrative  . Not on file   Social Determinants of Health   Financial Resource Strain:   . Difficulty of Paying Living Expenses:   Food Insecurity:   . Worried About Charity fundraiser in the Last Year:   . Arboriculturist in the Last Year:   Transportation Needs:   . Film/video editor (Medical):   Marland Kitchen Lack of Transportation (Non-Medical):   Physical Activity:   . Days of Exercise per Week:   . Minutes of Exercise per Session:   Stress:   . Feeling of Stress :   Social Connections:   . Frequency of Communication with Friends and Family:   . Frequency of Social Gatherings with Friends and Family:   . Attends Religious Services:   . Active Member of Clubs or Organizations:   . Attends Archivist Meetings:   Marland Kitchen Marital Status:   Intimate Partner Violence:   . Fear of Current or Ex-Partner:   . Emotionally Abused:   Marland Kitchen Physically Abused:   . Sexually Abused:     FAMILY HISTORY: Family History  Problem Relation Age of Onset  . CAD Mother   . Colon cancer Father     ALLERGIES:  is allergic to levaquin [levofloxacin]; 5ht3 receptor antagonists; diphenhydramine hcl;  escitalopram; maxidex [dexamethasone]; prednisone; serotonin; vytorin [ezetimibe-simvastatin]; zocor [simvastatin]; and diltiazem.  MEDICATIONS:  Current Outpatient Medications  Medication Sig Dispense Refill  . acetaminophen (TYLENOL) 500 MG tablet Take 500-1,000 mg by mouth every 6 (six) hours as needed for mild pain, moderate pain or fever.     Marland Kitchen aspirin EC 81 MG EC tablet Take 1 tablet (81 mg total) by mouth daily.    Marland Kitchen atorvastatin (LIPITOR) 40 MG tablet Take 1 tablet (40 mg total) by mouth daily at 6 PM. 30 tablet 0  . digoxin (LANOXIN) 0.125 MG tablet Take 0.0625 mg by mouth daily.    Marland Kitchen docusate sodium (COLACE) 100 MG capsule Take 100 mg by mouth daily as needed for mild constipation.     . famotidine (PEPCID) 20 MG tablet Take 20 mg by mouth at bedtime.    . ferrous sulfate 325 (65 FE) MG tablet Take 1 tablet (325 mg total) by mouth every other day. (Patient taking differently: Take 325 mg by mouth every Monday, Wednesday, and Friday. ) 30 tablet 3  . furosemide (LASIX) 40 MG tablet Take 1 tablet (40 mg total) by mouth daily. (Patient taking differently: Take 20 mg by mouth daily. ) 30 tablet 3  . HYDROcodone-acetaminophen (NORCO/VICODIN) 5-325 MG tablet Take 1 tablet by mouth every 6 (six) hours as needed  for moderate pain. 15 tablet 0  . metoprolol tartrate (LOPRESSOR) 25 MG tablet Take 0.5 tablets (12.5 mg total) by mouth 2 (two) times daily. 30 tablet 0  . nitroGLYCERIN (NITROSTAT) 0.4 MG SL tablet Place 0.4 mg under the tongue every 5 (five) minutes as needed for chest pain.    Marland Kitchen omeprazole (PRILOSEC) 20 MG capsule Take 1 capsule (20 mg total) by mouth daily. 30 capsule 1  . polyethylene glycol (MIRALAX / GLYCOLAX) 17 g packet Take 17 g by mouth daily as needed for mild constipation. 14 each 0  . ranolazine (RANEXA) 1000 MG SR tablet Take 500 mg by mouth 2 (two) times daily.     . sucralfate (CARAFATE) 1 G tablet Take 1 g by mouth 2 (two) times daily.     . tamsulosin (FLOMAX) 0.4 MG  CAPS capsule Take 0.4 mg by mouth daily after supper.     . vitamin B-12 (CYANOCOBALAMIN) 500 MCG tablet Take 500 mcg by mouth daily.    . vitamin C (VITAMIN C) 250 MG tablet Take 1 tablet (250 mg total) by mouth daily. 30 tablet 0   No current facility-administered medications for this visit.     PHYSICAL EXAMINATION: ECOG PERFORMANCE STATUS: 0 - Asymptomatic  Vitals:   10/14/19 1028  BP: 129/82  Pulse: 90  Temp: (!) 97 F (36.1 C)   Filed Weights   10/14/19 1028  Weight: 188 lb 9.6 oz (85.5 kg)    Physical Exam  Constitutional: He is oriented to person, place, and time.  Elderly Caucasian male patient. He accompanied by his son; patient in wheelchair.   HENT:  Head: Normocephalic and atraumatic.  Mouth/Throat: Oropharynx is clear and moist. No oropharyngeal exudate.  Eyes: Pupils are equal, round, and reactive to light.  Cardiovascular: Normal rate and regular rhythm.  Pulmonary/Chest: Breath sounds normal. No respiratory distress. He has no wheezes.  Abdominal: Soft. Bowel sounds are normal. He exhibits no distension and no mass. There is no abdominal tenderness. There is no rebound and no guarding.  Musculoskeletal:        General: No tenderness or edema. Normal range of motion.     Cervical back: Normal range of motion and neck supple.  Neurological: He is alert and oriented to person, place, and time.  Skin: Skin is warm.  Psychiatric: Affect normal.   .   LABORATORY DATA:  I have reviewed the data as listed Lab Results  Component Value Date   WBC 11.5 (H) 10/14/2019   HGB 10.7 (L) 10/14/2019   HCT 32.3 (L) 10/14/2019   MCV 101.6 (H) 10/14/2019   PLT 118 (L) 10/14/2019   Recent Labs    01/07/19 0711 01/08/19 0428 06/16/19 1419 06/22/19 1003 07/29/19 0825 07/29/19 0825 07/29/19 2314 07/30/19 0224 07/31/19 0413 08/01/19 0444 08/02/19 0459 08/31/19 1007 10/14/19 0948  NA 140   < > 136   < > 140   < > 138   < > 135  136   < > 137 140 138  K 4.3    < > 4.1   < > 4.2   < > 3.9   < > 3.9  3.8   < > 3.6 4.0 3.7  CL 109   < > 104   < > 104   < > 106   < > 104  104   < > 107 107 104  CO2 21*   < > 21*   < > 23   < >  24   < > 19*  21*   < > 23 22 22   GLUCOSE 99   < > 141*   < > 122*   < > 108*   < > 101*  108*   < > 105* 130* 120*  BUN 24*   < > 56*   < > 30*   < > 27*   < > 32*  32*   < > 29* 27* 31*  CREATININE 1.51*   < > 2.18*   < > 1.95*   < > 1.95*   < > 1.85*  1.87*   < > 1.53* 1.65* 1.67*  CALCIUM 8.8*   < > 9.4   < > 8.9   < > 8.5*   < > 7.7*  7.8*   < > 7.7* 8.6* 9.0  GFRNONAA 42*   < > 26*   < > 30*   < > 30*   < > 32*  32*   < > 41* 37* 36*  GFRAA 48*   < > 31*   < > 35*   < > 35*   < > 37*  37*   < > 47* 43* 42*  PROT 7.8   < > 7.9  --  7.4  --  6.8  --   --   --   --   --   --   ALBUMIN 3.8   < > 4.7  --  4.0  --  3.8  --  3.1*  --   --   --   --   AST 21   < > 20  --  20  --  36  --   --   --   --   --   --   ALT 15   < > 25  --  19  --  18  --   --   --   --   --   --   ALKPHOS 59   < > 64  --  58  --  55  --   --   --   --   --   --   BILITOT 0.9   < > 1.1  --  1.3*  --  1.9*  --   --   --   --   --   --   BILIDIR 0.2  --   --   --   --   --   --   --   --   --   --   --   --   IBILI 0.7  --   --   --   --   --   --   --   --   --   --   --   --    < > = values in this interval not displayed.   ASSESSMENT & PLAN:   Anemia of chronic kidney failure, stage 3 (moderate) ([place]) # Anemia-mild.  Since 2012.  Suspect MDS/chronic kidney disease.  No bone marrow biopsy; STABLE.   # Hemoglobin 10.7 ;proceed with today.  HOLD Aranesp today.  Continue p.o. iron. April 2021- iron sat-22%.   # Mild intermittent thrombocytopenia-question ITP [nadir November 2020-80s].  Today platelets 1117;  ok to with asprin.  Stable  #Cholecystitis acute s/p cholecystostomy tube; awaiting definitive cholecystectomy on June 25th; ok from hem stand point with surgery,   #Recent acute CHF-2025% ejection fraction [[facility]]-see below ;on  Lasix 20 mg  a day [half pill once a day]; stable.   #CKD stage III-stable  #Left upper lobe cystic lesion-incidentally found on imaging with [facility]; December 2020 CT scan-left upper lobe cystic lesion-STABLE. Will repeat imaging in at next visit.   #Disposition: #  HOLD Aranesp today # in 2 weeks- cbc possible aranesp # # in 4 weeks- cbc possible aranesp # in 6 weeks MD- cbc/bmp/LDH/possible aranesp-Dr.B      Cammie Sickle, MD 10/14/2019 10:54 AM

## 2019-10-14 NOTE — Assessment & Plan Note (Addendum)
#  Anemia-mild.  Since 2012.  Suspect MDS/chronic kidney disease.  No bone marrow biopsy; STABLE.   # Hemoglobin 10.7 ;proceed with today.  HOLD Aranesp today.  Continue p.o. iron. April 2021- iron sat-22%.   # Mild intermittent thrombocytopenia-question ITP [nadir November 2020-80s].  Today platelets 1117;  ok to with asprin.  Stable  #Cholecystitis acute s/p cholecystostomy tube; awaiting definitive cholecystectomy on June 25th; ok from hem stand point with surgery,   #Recent acute CHF-2025% ejection fraction [[facility]]-see below ;on  Lasix 20 mg a day [half pill once a day]; stable.   #CKD stage III-stable  #Left upper lobe cystic lesion-incidentally found on imaging with [facility]; December 2020 CT scan-left upper lobe cystic lesion-STABLE. Will repeat imaging in at next visit.   #Disposition: #  HOLD Aranesp today # in 2 weeks- cbc possible aranesp # # in 4 weeks- cbc possible aranesp # in 6 weeks MD- cbc/bmp/LDH/possible aranesp-Dr.B

## 2019-10-19 DIAGNOSIS — L89892 Pressure ulcer of other site, stage 2: Secondary | ICD-10-CM | POA: Diagnosis not present

## 2019-10-19 DIAGNOSIS — L97512 Non-pressure chronic ulcer of other part of right foot with fat layer exposed: Secondary | ICD-10-CM | POA: Diagnosis not present

## 2019-10-19 DIAGNOSIS — L97521 Non-pressure chronic ulcer of other part of left foot limited to breakdown of skin: Secondary | ICD-10-CM | POA: Diagnosis not present

## 2019-10-19 DIAGNOSIS — G609 Hereditary and idiopathic neuropathy, unspecified: Secondary | ICD-10-CM | POA: Diagnosis not present

## 2019-10-21 ENCOUNTER — Other Ambulatory Visit: Payer: Self-pay

## 2019-10-21 ENCOUNTER — Encounter: Payer: Self-pay | Admitting: Emergency Medicine

## 2019-10-21 ENCOUNTER — Emergency Department: Payer: PPO

## 2019-10-21 ENCOUNTER — Emergency Department
Admission: EM | Admit: 2019-10-21 | Discharge: 2019-10-21 | Disposition: A | Discharge status: Home / Self Care | Payer: PPO | Attending: Student in an Organized Health Care Education/Training Program | Admitting: Student in an Organized Health Care Education/Training Program

## 2019-10-21 DIAGNOSIS — I951 Orthostatic hypotension: Secondary | ICD-10-CM | POA: Diagnosis not present

## 2019-10-21 DIAGNOSIS — I509 Heart failure, unspecified: Secondary | ICD-10-CM | POA: Insufficient documentation

## 2019-10-21 DIAGNOSIS — I130 Hypertensive heart and chronic kidney disease with heart failure and stage 1 through stage 4 chronic kidney disease, or unspecified chronic kidney disease: Secondary | ICD-10-CM | POA: Diagnosis not present

## 2019-10-21 DIAGNOSIS — Z7982 Long term (current) use of aspirin: Secondary | ICD-10-CM | POA: Diagnosis not present

## 2019-10-21 DIAGNOSIS — I2510 Atherosclerotic heart disease of native coronary artery without angina pectoris: Secondary | ICD-10-CM | POA: Insufficient documentation

## 2019-10-21 DIAGNOSIS — N183 Chronic kidney disease, stage 3 unspecified: Secondary | ICD-10-CM | POA: Insufficient documentation

## 2019-10-21 DIAGNOSIS — R42 Dizziness and giddiness: Secondary | ICD-10-CM | POA: Diagnosis not present

## 2019-10-21 DIAGNOSIS — Z79899 Other long term (current) drug therapy: Secondary | ICD-10-CM | POA: Insufficient documentation

## 2019-10-21 DIAGNOSIS — Z87891 Personal history of nicotine dependence: Secondary | ICD-10-CM | POA: Insufficient documentation

## 2019-10-21 DIAGNOSIS — J189 Pneumonia, unspecified organism: Secondary | ICD-10-CM | POA: Diagnosis not present

## 2019-10-21 LAB — URINALYSIS, COMPLETE (UACMP) WITH MICROSCOPIC
Bacteria, UA: NONE SEEN
Bilirubin Urine: NEGATIVE
Glucose, UA: NEGATIVE mg/dL
Hgb urine dipstick: NEGATIVE
Ketones, ur: NEGATIVE mg/dL
Leukocytes,Ua: NEGATIVE
Nitrite: NEGATIVE
Protein, ur: 30 mg/dL — AB
Specific Gravity, Urine: 1.02 (ref 1.005–1.030)
pH: 5 (ref 5.0–8.0)

## 2019-10-21 LAB — CBC
HCT: 34.7 % — ABNORMAL LOW (ref 39.0–52.0)
Hemoglobin: 11.2 g/dL — ABNORMAL LOW (ref 13.0–17.0)
MCH: 32.9 pg (ref 26.0–34.0)
MCHC: 32.3 g/dL (ref 30.0–36.0)
MCV: 102.1 fL — ABNORMAL HIGH (ref 80.0–100.0)
Platelets: 121 10*3/uL — ABNORMAL LOW (ref 150–400)
RBC: 3.4 MIL/uL — ABNORMAL LOW (ref 4.22–5.81)
RDW: 17.1 % — ABNORMAL HIGH (ref 11.5–15.5)
WBC: 8.7 10*3/uL (ref 4.0–10.5)
nRBC: 0 % (ref 0.0–0.2)

## 2019-10-21 LAB — HEPATIC FUNCTION PANEL
ALT: 28 U/L (ref 0–44)
AST: 23 U/L (ref 15–41)
Albumin: 4.4 g/dL (ref 3.5–5.0)
Alkaline Phosphatase: 56 U/L (ref 38–126)
Bilirubin, Direct: 0.2 mg/dL (ref 0.0–0.2)
Indirect Bilirubin: 1.2 mg/dL — ABNORMAL HIGH (ref 0.3–0.9)
Total Bilirubin: 1.4 mg/dL — ABNORMAL HIGH (ref 0.3–1.2)
Total Protein: 7.7 g/dL (ref 6.5–8.1)

## 2019-10-21 LAB — BASIC METABOLIC PANEL
Anion gap: 14 (ref 5–15)
BUN: 35 mg/dL — ABNORMAL HIGH (ref 8–23)
CO2: 22 mmol/L (ref 22–32)
Calcium: 9.3 mg/dL (ref 8.9–10.3)
Chloride: 102 mmol/L (ref 98–111)
Creatinine, Ser: 1.97 mg/dL — ABNORMAL HIGH (ref 0.61–1.24)
GFR calc Af Amer: 35 mL/min — ABNORMAL LOW (ref >60)
GFR calc non Af Amer: 30 mL/min — ABNORMAL LOW (ref >60)
Glucose, Bld: 133 mg/dL — ABNORMAL HIGH (ref 70–99)
Potassium: 3.6 mmol/L (ref 3.5–5.1)
Sodium: 138 mmol/L (ref 135–145)

## 2019-10-21 LAB — TROPONIN I (HIGH SENSITIVITY)
Troponin I (High Sensitivity): 33 ng/L — ABNORMAL HIGH (ref <18)
Troponin I (High Sensitivity): 31 ng/L — ABNORMAL HIGH (ref <18)

## 2019-10-21 MED ORDER — SODIUM CHLORIDE 0.9 % IV BOLUS
500.0000 mL | Freq: Once | INTRAVENOUS | Status: AC
  Administered 2019-10-21: 500 mL via INTRAVENOUS

## 2019-10-21 NOTE — ED Triage Notes (Addendum)
intermittent dizziness for couple days that triggers with movement. Describes as "spinning all around".  No pain.  VSS at this time.  Speech clear, pt alert and oriented. had clean stress test 2 weeks ago per pt.

## 2019-10-21 NOTE — ED Provider Notes (Signed)
[facility] Emergency Department Provider Note    First MD Initiated Contact with Patient 10/21/19 1151     (approximate)  I have reviewed the triage vital signs and the nursing notes.   HISTORY  Chief Complaint Dizziness    HPI Andres Gutierrez is a 86 y.o. male with the below listed past medical history status post recent [place] presents to the ER for several days of lightheadedness particularly with standing or ambulation.  States that sitting and resting makes symptoms resolved.  Does not feel spinning just feels lightheaded.  Denies any numbness or tingling.  No pain.  Had recent stress test that was negative.  He denies any blurry vision.  No fevers.  Denies any diarrhea or abdominal pain.    Past Medical History:  Diagnosis Date  . Anemia   . Anxiety   . Cardiomyopathy ([place])   . CHF (congestive heart failure) ([place])   . Chronic kidney disease    kidney stones  . Coronary artery disease   . Cough   . Hypertension   . Lymphadenopathy, hilar   . MI (myocardial infarction) ([place])    x 2  . Wheezing    Family History  Problem Relation Age of Onset  . CAD Mother   . Colon cancer Father    Past Surgical History:  Procedure Laterality Date  . BRONCHIAL NEEDLE ASPIRATION BIOPSY N/A 12/31/2014   Procedure: BRONCHIAL NEEDLE ASPIRATION BIOPSIES from carina;  Surgeon: Flora Lipps, MD;  Location: [facility] ORS;  Service: Cardiopulmonary;  Laterality: N/A;  . CARDIAC CATHETERIZATION N/A 12/28/2015   Procedure: Left Heart Cath and Cors/Grafts Angiography;  Surgeon: Yolonda Kida, MD;  Location: [place] CV LAB;  Service: Cardiovascular;  Laterality: N/A;  . CATARACT EXTRACTION W/PHACO Left 08/01/2016   Procedure: CATARACT EXTRACTION PHACO AND INTRAOCULAR LENS PLACEMENT ([place]) Left;  Surgeon: Leandrew Koyanagi, MD;  Location: [place];  Service: Ophthalmology;  Laterality: Left;  . CORONARY ANGIOPLASTY WITH STENT PLACEMENT    . CORONARY  ARTERY BYPASS GRAFT    . ENDOBRONCHIAL ULTRASOUND N/A 12/31/2014   Procedure: ENDOBRONCHIAL ULTRASOUND;  Surgeon: Flora Lipps, MD;  Location: [facility] ORS;  Service: Cardiopulmonary;  Laterality: N/A;  . ESOPHAGOGASTRODUODENOSCOPY (EGD) WITH PROPOFOL N/A 06/20/2015   Procedure: ESOPHAGOGASTRODUODENOSCOPY (EGD) WITH PROPOFOL;  Surgeon: Lollie Sails, MD;  Location: [facility] ENDOSCOPY;  Service: Endoscopy;  Laterality: N/A;  . IR CHOLANGIOGRAM EXISTING TUBE  08/13/2019   Patient Active Problem List   Diagnosis Date Noted  . Malnutrition of moderate degree 07/30/2019  . Acute neutrophilia 07/30/2019  . Sepsis ([place]) 07/29/2019  . Acute cholecystitis 07/29/2019  . Foot infection   . Acute on chronic heart failure ([place]) 06/07/2019  . Weakness   . NSTEMI (non-ST elevated myocardial infarction) ([place])   . Orthostatic hypotension 01/07/2019  . Macrocytic anemia 08/05/2018  . Anemia of chronic kidney failure, stage 3 (moderate) 08/05/2018  . BPH (benign prostatic hyperplasia) 07/24/2018  . Gastroesophageal reflux disease with esophagitis 07/24/2018  . Stage 3 chronic kidney disease 07/24/2018  . Thrombocytopenia ([place]) 07/24/2018  . Primary osteoarthritis of both knees 08/22/2017  . Chronic systolic heart failure ([place]) 01/17/2016  . Chewing tobacco use 01/17/2016  . Atrial fibrillation with RVR ([place]) 12/23/2015  . Dyspnea on exertion 12/23/2015  . Elevated troponin 12/23/2015  . GERD (gastroesophageal reflux disease) 12/23/2015  . Iron deficiency anemia due to chronic blood loss 12/20/2015  . Femoral neck fracture, left, closed, initial encounter 11/03/2015  . Chronic  atrial fibrillation ([place])   . Coronary artery disease involving native coronary artery of native heart without angina pectoris   . Acalculous cholecystitis 10/29/2015  . Adenopathy   . CAD (coronary artery disease) 12/22/2014  . HTN (hypertension) 12/22/2014      Prior to Admission medications   Medication Sig Start Date End Date  Taking? Authorizing Provider  acetaminophen (TYLENOL) 500 MG tablet Take 500-1,000 mg by mouth every 6 (six) hours as needed for mild pain, moderate pain or fever.    Yes [provider]  Ascorbic Acid (VITAMIN C PO) Take 1 tablet by mouth daily.   Yes [provider]  aspirin EC 81 MG EC tablet Take 1 tablet (81 mg total) by mouth daily. 06/10/19  Yes Danford, Suann Larry, MD  atorvastatin (LIPITOR) 40 MG tablet Take 1 tablet (40 mg total) by mouth daily at 6 PM. Patient taking differently: Take 40 mg by mouth daily with supper.  12/30/15  Yes Dustin Flock, MD  digoxin (LANOXIN) 0.125 MG tablet Take 0.0625 mg by mouth daily.   Yes [provider]  ranolazine (RANEXA) 1000 MG SR tablet Take 500 mg by mouth 2 (two) times daily.  01/16/19  Yes [provider]  tamsulosin (FLOMAX) 0.4 MG CAPS capsule Take 0.4 mg by mouth daily after supper.    Yes [provider]  docusate sodium (COLACE) 100 MG capsule Take 100 mg by mouth daily as needed for mild constipation.     [provider]  famotidine (PEPCID) 20 MG tablet Take 20 mg by mouth at bedtime. 02/04/19   [provider]  ferrous sulfate 325 (65 FE) MG tablet Take 1 tablet (325 mg total) by mouth every other day. Patient taking differently: Take 325 mg by mouth every Monday, Wednesday, and Friday. With supper 03/04/19   Verlon Au, NP  furosemide (LASIX) 40 MG tablet Take 1 tablet (40 mg total) by mouth daily. Patient taking differently: Take 20 mg by mouth See admin instructions. Take 0.5 tablet (20 mg) by mouth scheduled in the morning, if weight increased by 1.5-2 lbs in the morning then patient will take 1 tablet (40 mg) for fluid retention. 06/10/19   Danford, Suann Larry, MD  neomycin-bacitracin-polymyxin (NEOSPORIN) ointment Apply 1 application topically every other day. Wound care (foot area)    [provider]  nitroGLYCERIN (NITROSTAT) 0.4 MG SL tablet Place 0.4 mg  under the tongue every 5 (five) minutes as needed for chest pain.    [provider]  polyethylene glycol (MIRALAX / GLYCOLAX) 17 g packet Take 17 g by mouth daily as needed for mild constipation. 01/09/19   Nicholes Mango, MD  sodium chloride, PF, 0.9 % injection  10/16/19   [provider]  sucralfate (CARAFATE) 1 G tablet Take 1 g by mouth 2 (two) times daily.     [provider]  vitamin B-12 (CYANOCOBALAMIN) 500 MCG tablet Take 500 mcg by mouth daily.    [provider]    Allergies Levaquin [levofloxacin], 5ht3 receptor antagonists, Diphenhydramine hcl, Escitalopram, Maxidex [dexamethasone], Prednisone, Serotonin, Vytorin [ezetimibe-simvastatin], Zocor [simvastatin], and Diltiazem    Social History Social History   Tobacco Use  . Smoking status: Former Smoker    Quit date: 06/27/1977    Years since quitting: 42.3  . Smokeless tobacco: Current User    Types: Chew  Substance Use Topics  . Alcohol use: Yes    Comment: occational almost rare once a year  . Drug use: No  Review of Systems Patient denies headaches, rhinorrhea, blurry vision, numbness, shortness of breath, chest pain, edema, cough, abdominal pain, nausea, vomiting, diarrhea, dysuria, fevers, rashes or hallucinations unless otherwise stated above in HPI. ____________________________________________   PHYSICAL EXAM:  VITAL SIGNS: Vitals:   10/21/19 1151 10/21/19 1230  BP: 132/67 135/61  Pulse: 72 67  Resp: (!) 21 17  Temp:  (!) 97.5 F (36.4 C)  SpO2: 100% 100%    Constitutional: Alert and oriented.  Eyes: Conjunctivae are normal.  Head: Atraumatic. Nose: No congestion/rhinnorhea. Mouth/Throat: Mucous membranes are moist.   Neck: No stridor. Painless ROM.  Cardiovascular: Normal rate, regular rhythm. Grossly normal heart sounds.  Good peripheral circulation. Respiratory: Normal respiratory effort.  No retractions. Lungs CTAB. Gastrointestinal: Soft and nontender,  Perc  chole drain c/d/i. No distention. No abdominal bruits. No CVA tenderness. Genitourinary:  Musculoskeletal: No lower extremity tenderness nor edema.  No joint effusions. Neurologic:  CN- intact.  No facial droop, Normal FNF.  Normal heel to shin.  Sensation intact bilaterally. Normal speech and language. No gross focal neurologic deficits are appreciated. No gait instability. Skin:  Skin is warm, dry and intact. No rash noted. Psychiatric: Mood and affect are normal. Speech and behavior are normal.  ____________________________________________   LABS (all labs ordered are listed, but only abnormal results are displayed)  Results for orders placed or performed during the hospital encounter of 10/21/19 (from the past 24 hour(s))  Basic metabolic panel     Status: Abnormal   Collection Time: 10/21/19  9:24 AM  Result Value Ref Range   Sodium 138 135 - 145 mmol/L   Potassium 3.6 3.5 - 5.1 mmol/L   Chloride 102 98 - 111 mmol/L   CO2 22 22 - 32 mmol/L   Glucose, Bld 133 (H) 70 - 99 mg/dL   BUN 35 (H) 8 - 23 mg/dL   Creatinine, Ser 1.97 (H) 0.61 - 1.24 mg/dL   Calcium 9.3 8.9 - 10.3 mg/dL   GFR calc non Af Amer 30 (L) >60 mL/min   GFR calc Af Amer 35 (L) >60 mL/min   Anion gap 14 5 - 15  CBC     Status: Abnormal   Collection Time: 10/21/19  9:24 AM  Result Value Ref Range   WBC 8.7 4.0 - 10.5 K/uL   RBC 3.40 (L) 4.22 - 5.81 MIL/uL   Hemoglobin 11.2 (L) 13.0 - 17.0 g/dL   HCT 34.7 (L) 39.0 - 52.0 %   MCV 102.1 (H) 80.0 - 100.0 fL   MCH 32.9 26.0 - 34.0 pg   MCHC 32.3 30.0 - 36.0 g/dL   RDW 17.1 (H) 11.5 - 15.5 %   Platelets 121 (L) 150 - 400 K/uL   nRBC 0.0 0.0 - 0.2 %  Urinalysis, Complete w Microscopic     Status: Abnormal   Collection Time: 10/21/19  9:38 AM  Result Value Ref Range   Color, Urine AMBER (A) YELLOW   APPearance HAZY (A) CLEAR   Specific Gravity, Urine 1.020 1.005 - 1.030   pH 5.0 5.0 - 8.0   Glucose, UA NEGATIVE NEGATIVE mg/dL   Hgb urine dipstick NEGATIVE  NEGATIVE   Bilirubin Urine NEGATIVE NEGATIVE   Ketones, ur NEGATIVE NEGATIVE mg/dL   Protein, ur 30 (A) NEGATIVE mg/dL   Nitrite NEGATIVE NEGATIVE   Leukocytes,Ua NEGATIVE NEGATIVE   RBC / HPF 0-5 0 - 5 RBC/hpf   WBC, UA 0-5 0 - 5 WBC/hpf   Bacteria, UA NONE SEEN NONE SEEN  Squamous Epithelial / LPF 0-5 0 - 5   Mucus PRESENT    Hyaline Casts, UA PRESENT   Troponin I (High Sensitivity)     Status: Abnormal   Collection Time: 10/21/19 12:31 PM  Result Value Ref Range   Troponin I (High Sensitivity) 33 (H) <18 ng/L  Hepatic function panel     Status: Abnormal   Collection Time: 10/21/19 12:31 PM  Result Value Ref Range   Total Protein 7.7 6.5 - 8.1 g/dL   Albumin 4.4 3.5 - 5.0 g/dL   AST 23 15 - 41 U/L   ALT 28 0 - 44 U/L   Alkaline Phosphatase 56 38 - 126 U/L   Total Bilirubin 1.4 (H) 0.3 - 1.2 mg/dL   Bilirubin, Direct 0.2 0.0 - 0.2 mg/dL   Indirect Bilirubin 1.2 (H) 0.3 - 0.9 mg/dL  Troponin I (High Sensitivity)     Status: Abnormal   Collection Time: 10/21/19  1:56 PM  Result Value Ref Range   Troponin I (High Sensitivity) 31 (H) <18 ng/L   ____________________________________________  EKG My review and personal interpretation at Time: 9:30   Indication: lightheadedness  Rate: 85  Rhythm: sinus Axis: normal Other: lvh criteria,  No sgarbossa criteria, c/w previous tracings ____________________________________________  RADIOLOGY  I personally reviewed all radiographic images ordered to evaluate for the above acute complaints and reviewed radiology reports and findings.  These findings were personally discussed with the patient.  Please see medical record for radiology report.  ____________________________________________   PROCEDURES  Procedure(s) performed:  Procedures    Critical Care performed: no ____________________________________________   INITIAL IMPRESSION / ASSESSMENT AND PLAN / ED COURSE  Pertinent labs & imaging results that were available  during my care of the patient were reviewed by me and considered in my medical decision making (see chart for details).   DDX: Dehydration, AKI, orthostasis, anemia, dysrhythmia, ACS, CVA, vertigo  Andres Gutierrez is a 86 y.o. who presents to the ED with symptoms as described above.  Patient's presentation concerning for dehydration orthostasis likely secondary to some increased volume losses secondary to cholecystostomy drain.  He is not tender no fevers.  Denies any chest pain or pressure.  Did not fully lose consciousness.  His neuro exam is nonfocal no deficits.  This does not seem consistent with CNS lesion.  Will observe on cardiac monitor will order serial enzymes but have a lower suspicion for ACS given recent negative stress and no pain or pressure.  Will give gentle IV fluids.  Does not exhibit signs of acute CHF.  The patient will be placed on continuous pulse oximetry and telemetry for monitoring.  Laboratory evaluation will be sent to evaluate for the above complaints.     Clinical Course as of Oct 21 1438  Wed Oct 21, 2019  1230 Patient is orthostatic.  I suspect a component of volume loss secondary to his Coley drain.  He is nontender no fever.  Denies any active chest pain or pressure will give IV fluids check troponins and reassess.   [PR]    Clinical Course User Index [PR] Merlyn Lot, MD    The patient was evaluated in Emergency Department today for the symptoms described in the history of present illness. He/she was evaluated in the context of the global COVID-19 pandemic, which necessitated consideration that the patient might be at risk for infection with the SARS-CoV-2 virus that causes COVID-19. Institutional protocols and algorithms that pertain to the evaluation of patients at risk  for COVID-19 are in a state of rapid change based on information released by regulatory bodies including the CDC and federal and state organizations. These policies and algorithms were followed  during the patient's care in the ED.  As part of my medical decision making, I reviewed the following data within the [place] notes reviewed and incorporated, Labs reviewed, notes from prior ED visits and [place] Controlled Substance Database   ____________________________________________   FINAL CLINICAL IMPRESSION(S) / ED DIAGNOSES  Final diagnoses:  Orthostasis  Dizziness      NEW MEDICATIONS STARTED DURING THIS VISIT:  New Prescriptions   No medications on file     Note:  This document was prepared using Dragon voice recognition software and may include unintentional dictation errors.    Merlyn Lot, MD 10/21/19 1440

## 2019-10-21 NOTE — ED Notes (Signed)
Full rainbow drawn on pt.

## 2019-10-26 ENCOUNTER — Other Ambulatory Visit: Payer: Self-pay | Admitting: Surgery

## 2019-10-26 ENCOUNTER — Other Ambulatory Visit: Payer: Self-pay

## 2019-10-26 ENCOUNTER — Ambulatory Visit
Admission: RE | Admit: 2019-10-26 | Discharge: 2019-10-26 | Disposition: A | Discharge status: Home / Self Care | Payer: PPO | Source: Ambulatory Visit | Attending: Surgery | Admitting: Surgery

## 2019-10-26 DIAGNOSIS — Z434 Encounter for attention to other artificial openings of digestive tract: Secondary | ICD-10-CM

## 2019-10-26 DIAGNOSIS — Z4803 Encounter for change or removal of drains: Secondary | ICD-10-CM | POA: Insufficient documentation

## 2019-10-26 DIAGNOSIS — Z4659 Encounter for fitting and adjustment of other gastrointestinal appliance and device: Secondary | ICD-10-CM | POA: Diagnosis not present

## 2019-10-26 HISTORY — PX: IR CATHETER TUBE CHANGE: IMG717

## 2019-10-26 NOTE — Procedures (Signed)
Pre procedural Dx: Acute cholecysitis Post procedural Dx: Same  Technically successful Fluoro guided exchange of existing 10 Fr drainage catheter placement into the gallbladder lumen. Chole tube connected to gravity bag.  EBL: None  Complications: None immediate  Ronny Bacon, MD Pager #: [phone]

## 2019-10-28 ENCOUNTER — Encounter
Admission: RE | Admit: 2019-10-28 | Discharge: 2019-10-28 | Disposition: A | Discharge status: Home / Self Care | Payer: PPO | Source: Ambulatory Visit | Attending: Surgery | Admitting: Surgery

## 2019-10-28 ENCOUNTER — Other Ambulatory Visit: Payer: Self-pay

## 2019-10-28 ENCOUNTER — Inpatient Hospital Stay: Payer: PPO

## 2019-10-28 VITALS — BP 119/57 | HR 74

## 2019-10-28 DIAGNOSIS — I129 Hypertensive chronic kidney disease with stage 1 through stage 4 chronic kidney disease, or unspecified chronic kidney disease: Secondary | ICD-10-CM | POA: Diagnosis not present

## 2019-10-28 DIAGNOSIS — N183 Chronic kidney disease, stage 3 unspecified: Secondary | ICD-10-CM

## 2019-10-28 DIAGNOSIS — D500 Iron deficiency anemia secondary to blood loss (chronic): Secondary | ICD-10-CM

## 2019-10-28 DIAGNOSIS — D631 Anemia in chronic kidney disease: Secondary | ICD-10-CM

## 2019-10-28 HISTORY — DX: Dyspnea, unspecified: R06.00

## 2019-10-28 LAB — CBC WITH DIFFERENTIAL/PLATELET
Abs Immature Granulocytes: 0.16 10*3/uL — ABNORMAL HIGH (ref 0.00–0.07)
Basophils Absolute: 0 10*3/uL (ref 0.0–0.1)
Basophils Relative: 0 %
Eosinophils Absolute: 0.1 10*3/uL (ref 0.0–0.5)
Eosinophils Relative: 1 %
HCT: 29.8 % — ABNORMAL LOW (ref 39.0–52.0)
Hemoglobin: 9.8 g/dL — ABNORMAL LOW (ref 13.0–17.0)
Immature Granulocytes: 2 %
Lymphocytes Relative: 23 %
Lymphs Abs: 2.5 10*3/uL (ref 0.7–4.0)
MCH: 33.3 pg (ref 26.0–34.0)
MCHC: 32.9 g/dL (ref 30.0–36.0)
MCV: 101.4 fL — ABNORMAL HIGH (ref 80.0–100.0)
Monocytes Absolute: 2.6 10*3/uL — ABNORMAL HIGH (ref 0.1–1.0)
Monocytes Relative: 24 %
Neutro Abs: 5.5 10*3/uL (ref 1.7–7.7)
Neutrophils Relative %: 50 %
Platelets: 125 10*3/uL — ABNORMAL LOW (ref 150–400)
RBC: 2.94 MIL/uL — ABNORMAL LOW (ref 4.22–5.81)
RDW: 17 % — ABNORMAL HIGH (ref 11.5–15.5)
WBC: 10.8 10*3/uL — ABNORMAL HIGH (ref 4.0–10.5)
nRBC: 0 % (ref 0.0–0.2)

## 2019-10-28 MED ORDER — DARBEPOETIN ALFA 300 MCG/0.6ML IJ SOSY
300.0000 ug | PREFILLED_SYRINGE | Freq: Once | INTRAMUSCULAR | Status: AC
  Administered 2019-10-28: 300 ug via SUBCUTANEOUS
  Filled 2019-10-28: qty 0.6

## 2019-10-28 NOTE — Pre-Procedure Instructions (Signed)
Patient HOH. PAT interview with daughter [name] (caregiver).

## 2019-10-28 NOTE — Patient Instructions (Addendum)
Your procedure is scheduled on: 11/06/19 Report to [place]. To find out your arrival time please call [phone] between 1PM - 3PM on 11/05/19.  Remember: Instructions that are not followed completely may result in serious medical risk, up to and including death, or upon the discretion of your surgeon and anesthesiologist your surgery may need to be rescheduled.     _X__ 1. Do not eat food after midnight the night before your procedure.                 No gum chewing or hard candies. You may drink clear liquids up to 2 hours                 before you are scheduled to arrive for your surgery- DO not drink clear                 liquids within 2 hours of the start of your surgery.                 Clear Liquids include:  water, apple juice without pulp, clear carbohydrate                 drink such as Clearfast or Gatorade, Black Coffee or Tea (Do not add                 anything to coffee or tea). Diabetics water only  __X__2.  On the morning of surgery brush your teeth with toothpaste and water, you                 may rinse your mouth with mouthwash if you wish.  Do not swallow any              toothpaste of mouthwash.     _X__ 3.  No Alcohol for 24 hours before or after surgery.   _X__ 4.  Do Not Smoke or use e-cigarettes For 24 Hours Prior to Your Surgery.                 Do not use any chewable tobacco products for at least 6 hours prior to                 surgery.  ____  5.  Bring all medications with you on the day of surgery if instructed.   __X__  6.  Notify your doctor if there is any change in your medical condition      (cold, fever, infections).     Do not wear jewelry, make-up, hairpins, clips or nail polish. Do not wear lotions, powders, or perfumes.  Do not shave 48 hours prior to surgery. Men may shave face and neck. Do not bring valuables to the hospital.    [facility] is not responsible for any belongings or  valuables.  Contacts, dentures/partials or body piercings may not be worn into surgery. Bring a case for your contacts, glasses or hearing aids, a denture cup will be supplied. Leave your suitcase in the car. After surgery it may be brought to your room. For patients admitted to the hospital, discharge time is determined by your treatment team.   Patients discharged the day of surgery will not be allowed to drive home.   Please read over the following fact sheets that you were given:   MRSA Information  __X__ Take these medicines the morning of surgery with A SIP OF WATER:  1. digoxin (LANOXIN) 0.0625 MG tablet  2. ranolazine (RANEXA) 500 MG SR tablet  3. pantoprazole (PROTONIX) 40 MG tablet  4.  5.  6.  ____ Fleet Enema (as directed)   __X__ Use CHG Soap/SAGE wipes as directed  ____ Use inhalers on the day of surgery  ____ Stop metformin/Janumet/Farxiga 2 days prior to surgery    ____ Take 1/2 of usual insulin dose the night before surgery. No insulin the morning          of surgery.   ____ Stop Blood Thinners Coumadin/Plavix/Xarelto/Pleta/Pradaxa/Eliquis/Effient/Aspirin  on   Or contact your Surgeon, Cardiologist or Medical Doctor regarding  ability to stop your blood thinners  __X__ Stop Anti-inflammatories 7 days before surgery such as Advil, Ibuprofen, Motrin,  BC or Goodies Powder, Naprosyn, Naproxen, Aleve,     __X__ Stop all herbal supplements, fish oil or vitamin E until after surgery.    ____ Bring C-Pap to the hospital.   STOP ASPIRIN AS PREVIOUSLY INSTRUCTED

## 2019-10-28 NOTE — Pre-Procedure Instructions (Signed)
Interpretation copied from ED note 10/21/19  EKG My review and personal interpretation at Time: 9:30   Indication: lightheadedness  Rate: 85  Rhythm: sinus Axis: normal Other: lvh criteria,  No sgarbossa criteria, c/w previous tracings

## 2019-11-04 ENCOUNTER — Other Ambulatory Visit: Payer: Self-pay

## 2019-11-04 ENCOUNTER — Other Ambulatory Visit
Admission: RE | Admit: 2019-11-04 | Discharge: 2019-11-04 | Disposition: A | Discharge status: Home / Self Care | Payer: PPO | Source: Ambulatory Visit | Attending: Surgery | Admitting: Surgery

## 2019-11-04 DIAGNOSIS — Z20822 Contact with and (suspected) exposure to covid-19: Secondary | ICD-10-CM | POA: Diagnosis not present

## 2019-11-04 DIAGNOSIS — Z01812 Encounter for preprocedural laboratory examination: Secondary | ICD-10-CM | POA: Insufficient documentation

## 2019-11-04 LAB — SARS CORONAVIRUS 2 (TAT 6-24 HRS): SARS Coronavirus 2: NEGATIVE

## 2019-11-06 ENCOUNTER — Ambulatory Visit: Payer: PPO | Admitting: Anesthesiology

## 2019-11-06 ENCOUNTER — Observation Stay
Admission: RE | Admit: 2019-11-06 | Discharge: 2019-11-07 | Disposition: A | Discharge status: Home / Self Care | Payer: PPO | Source: Ambulatory Visit | Attending: Surgery | Admitting: Surgery

## 2019-11-06 ENCOUNTER — Encounter: Payer: Self-pay | Admitting: Surgery

## 2019-11-06 ENCOUNTER — Other Ambulatory Visit: Payer: Self-pay

## 2019-11-06 ENCOUNTER — Encounter
Admission: RE | Disposition: A | Discharge status: Home / Self Care | Payer: Self-pay | Source: Ambulatory Visit | Attending: Surgery

## 2019-11-06 DIAGNOSIS — N183 Chronic kidney disease, stage 3 unspecified: Secondary | ICD-10-CM | POA: Diagnosis not present

## 2019-11-06 DIAGNOSIS — Z951 Presence of aortocoronary bypass graft: Secondary | ICD-10-CM | POA: Diagnosis not present

## 2019-11-06 DIAGNOSIS — I130 Hypertensive heart and chronic kidney disease with heart failure and stage 1 through stage 4 chronic kidney disease, or unspecified chronic kidney disease: Secondary | ICD-10-CM | POA: Diagnosis not present

## 2019-11-06 DIAGNOSIS — K812 Acute cholecystitis with chronic cholecystitis: Secondary | ICD-10-CM | POA: Insufficient documentation

## 2019-11-06 DIAGNOSIS — K2100 Gastro-esophageal reflux disease with esophagitis, without bleeding: Secondary | ICD-10-CM | POA: Diagnosis not present

## 2019-11-06 DIAGNOSIS — I110 Hypertensive heart disease with heart failure: Secondary | ICD-10-CM | POA: Diagnosis not present

## 2019-11-06 DIAGNOSIS — I509 Heart failure, unspecified: Secondary | ICD-10-CM | POA: Diagnosis not present

## 2019-11-06 DIAGNOSIS — K811 Chronic cholecystitis: Secondary | ICD-10-CM | POA: Diagnosis present

## 2019-11-06 DIAGNOSIS — Z87891 Personal history of nicotine dependence: Secondary | ICD-10-CM | POA: Insufficient documentation

## 2019-11-06 DIAGNOSIS — K819 Cholecystitis, unspecified: Secondary | ICD-10-CM | POA: Diagnosis not present

## 2019-11-06 DIAGNOSIS — K810 Acute cholecystitis: Secondary | ICD-10-CM | POA: Diagnosis not present

## 2019-11-06 DIAGNOSIS — D631 Anemia in chronic kidney disease: Secondary | ICD-10-CM | POA: Diagnosis not present

## 2019-11-06 LAB — CBC
HCT: 29.3 % — ABNORMAL LOW (ref 39.0–52.0)
Hemoglobin: 9.4 g/dL — ABNORMAL LOW (ref 13.0–17.0)
MCH: 33.2 pg (ref 26.0–34.0)
MCHC: 32.1 g/dL (ref 30.0–36.0)
MCV: 103.5 fL — ABNORMAL HIGH (ref 80.0–100.0)
Platelets: 154 10*3/uL (ref 150–400)
RBC: 2.83 MIL/uL — ABNORMAL LOW (ref 4.22–5.81)
RDW: 17.9 % — ABNORMAL HIGH (ref 11.5–15.5)
WBC: 18.2 10*3/uL — ABNORMAL HIGH (ref 4.0–10.5)
nRBC: 0 % (ref 0.0–0.2)

## 2019-11-06 LAB — CREATININE, SERUM
Creatinine, Ser: 1.9 mg/dL — ABNORMAL HIGH (ref 0.61–1.24)
GFR calc Af Amer: 36 mL/min — ABNORMAL LOW (ref >60)
GFR calc non Af Amer: 31 mL/min — ABNORMAL LOW (ref >60)

## 2019-11-06 SURGERY — CHOLECYSTECTOMY, ROBOT-ASSISTED, LAPAROSCOPIC
Anesthesia: General | Site: Abdomen

## 2019-11-06 MED ORDER — CHLORHEXIDINE GLUCONATE 0.12 % MT SOLN
OROMUCOSAL | Status: AC
  Filled 2019-11-06: qty 15

## 2019-11-06 MED ORDER — SUGAMMADEX SODIUM 200 MG/2ML IV SOLN
INTRAVENOUS | Status: DC | PRN
  Administered 2019-11-06: 200 mg via INTRAVENOUS

## 2019-11-06 MED ORDER — DIGOXIN 125 MCG PO TABS
0.0625 mg | ORAL_TABLET | Freq: Every day | ORAL | Status: DC
  Administered 2019-11-07: 0.0625 mg via ORAL
  Filled 2019-11-06: qty 0.5

## 2019-11-06 MED ORDER — PROPOFOL 10 MG/ML IV BOLUS
INTRAVENOUS | Status: AC
  Filled 2019-11-06: qty 20

## 2019-11-06 MED ORDER — ACETAMINOPHEN 10 MG/ML IV SOLN
INTRAVENOUS | Status: AC
  Filled 2019-11-06: qty 100

## 2019-11-06 MED ORDER — BUPIVACAINE HCL (PF) 0.5 % IJ SOLN
INTRAMUSCULAR | Status: AC
  Filled 2019-11-06: qty 30

## 2019-11-06 MED ORDER — IBUPROFEN 400 MG PO TABS
800.0000 mg | ORAL_TABLET | Freq: Three times a day (TID) | ORAL | 0 refills | Status: DC | PRN
Start: 2019-11-06 — End: 2020-02-26

## 2019-11-06 MED ORDER — PROCHLORPERAZINE EDISYLATE 10 MG/2ML IJ SOLN
5.0000 mg | Freq: Four times a day (QID) | INTRAMUSCULAR | Status: DC | PRN
  Filled 2019-11-06: qty 2

## 2019-11-06 MED ORDER — LIDOCAINE HCL (CARDIAC) PF 100 MG/5ML IV SOSY
PREFILLED_SYRINGE | INTRAVENOUS | Status: DC | PRN
  Administered 2019-11-06: 60 mg via INTRAVENOUS

## 2019-11-06 MED ORDER — POLYETHYLENE GLYCOL 3350 17 G PO PACK: 17.0000 g | PACK | Freq: Every day | ORAL | Status: DC | PRN

## 2019-11-06 MED ORDER — LACTATED RINGERS IV SOLN: INTRAVENOUS | Status: DC

## 2019-11-06 MED ORDER — LIDOCAINE HCL (PF) 2 % IJ SOLN
INTRAMUSCULAR | Status: AC
  Filled 2019-11-06: qty 5

## 2019-11-06 MED ORDER — INDOCYANINE GREEN 25 MG IV SOLR
1.2500 mg | Freq: Once | INTRAVENOUS | Status: AC
  Administered 2019-11-06: 1.25 mg via INTRAVENOUS
  Filled 2019-11-06: qty 10

## 2019-11-06 MED ORDER — CYANOCOBALAMIN 500 MCG PO TABS
500.0000 ug | ORAL_TABLET | Freq: Every day | ORAL | Status: DC
  Administered 2019-11-07: 500 ug via ORAL
  Filled 2019-11-06: qty 1

## 2019-11-06 MED ORDER — ROCURONIUM BROMIDE 100 MG/10ML IV SOLN
INTRAVENOUS | Status: DC | PRN
  Administered 2019-11-06: 10 mg via INTRAVENOUS
  Administered 2019-11-06: 50 mg via INTRAVENOUS
  Administered 2019-11-06: 10 mg via INTRAVENOUS

## 2019-11-06 MED ORDER — TAMSULOSIN HCL 0.4 MG PO CAPS
0.4000 mg | ORAL_CAPSULE | Freq: Every day | ORAL | Status: DC
  Administered 2019-11-06: 0.4 mg via ORAL
  Filled 2019-11-06: qty 1

## 2019-11-06 MED ORDER — HYDROCODONE-ACETAMINOPHEN 5-325 MG PO TABS: 1.0000 | ORAL_TABLET | Freq: Four times a day (QID) | ORAL | Status: DC | PRN

## 2019-11-06 MED ORDER — DEXAMETHASONE SODIUM PHOSPHATE 10 MG/ML IJ SOLN
INTRAMUSCULAR | Status: DC | PRN
  Administered 2019-11-06: 5 mg via INTRAVENOUS

## 2019-11-06 MED ORDER — CEFAZOLIN SODIUM-DEXTROSE 2-4 GM/100ML-% IV SOLN
INTRAVENOUS | Status: AC
  Filled 2019-11-06: qty 100

## 2019-11-06 MED ORDER — SUCRALFATE 1 G PO TABS
1.0000 g | ORAL_TABLET | Freq: Two times a day (BID) | ORAL | Status: DC
  Administered 2019-11-06 – 2019-11-07 (×2): 1 g via ORAL
  Filled 2019-11-06 (×2): qty 1

## 2019-11-06 MED ORDER — NITROGLYCERIN 0.4 MG SL SUBL: 0.4000 mg | SUBLINGUAL_TABLET | SUBLINGUAL | Status: DC | PRN

## 2019-11-06 MED ORDER — IBUPROFEN 600 MG PO TABS
600.0000 mg | ORAL_TABLET | Freq: Four times a day (QID) | ORAL | Status: DC | PRN
  Filled 2019-11-06: qty 1

## 2019-11-06 MED ORDER — PROCHLORPERAZINE MALEATE 10 MG PO TABS
10.0000 mg | ORAL_TABLET | Freq: Four times a day (QID) | ORAL | Status: DC | PRN
  Filled 2019-11-06: qty 1

## 2019-11-06 MED ORDER — PHENYLEPHRINE HCL (PRESSORS) 10 MG/ML IV SOLN
INTRAVENOUS | Status: AC
  Filled 2019-11-06: qty 1

## 2019-11-06 MED ORDER — PHENYLEPHRINE HCL-NACL 10-0.9 MG/250ML-% IV SOLN
INTRAVENOUS | Status: DC | PRN
  Administered 2019-11-06: 50 ug/min via INTRAVENOUS

## 2019-11-06 MED ORDER — ORAL CARE MOUTH RINSE: 15.0000 mL | Freq: Once | OROMUCOSAL | Status: AC

## 2019-11-06 MED ORDER — FERROUS SULFATE 325 (65 FE) MG PO TABS
325.0000 mg | ORAL_TABLET | ORAL | Status: DC
  Administered 2019-11-07: 325 mg via ORAL
  Filled 2019-11-06: qty 1

## 2019-11-06 MED ORDER — LIDOCAINE-EPINEPHRINE (PF) 1 %-1:200000 IJ SOLN
INTRAMUSCULAR | Status: AC
  Filled 2019-11-06: qty 30

## 2019-11-06 MED ORDER — FENTANYL CITRATE (PF) 100 MCG/2ML IJ SOLN
INTRAMUSCULAR | Status: AC
  Filled 2019-11-06: qty 2

## 2019-11-06 MED ORDER — ACETAMINOPHEN 325 MG PO TABS: 650.0000 mg | ORAL_TABLET | Freq: Four times a day (QID) | ORAL | Status: DC | PRN

## 2019-11-06 MED ORDER — ACETAMINOPHEN 325 MG PO TABS: 650.0000 mg | ORAL_TABLET | Freq: Three times a day (TID) | ORAL | 0 refills | Status: AC | PRN

## 2019-11-06 MED ORDER — PHENYLEPHRINE HCL (PRESSORS) 10 MG/ML IV SOLN
INTRAVENOUS | Status: DC | PRN
  Administered 2019-11-06: 300 ug via INTRAVENOUS
  Administered 2019-11-06 (×3): 200 ug via INTRAVENOUS
  Administered 2019-11-06: 300 ug via INTRAVENOUS

## 2019-11-06 MED ORDER — FENTANYL CITRATE (PF) 100 MCG/2ML IJ SOLN: 25.0000 ug | INTRAMUSCULAR | Status: DC | PRN

## 2019-11-06 MED ORDER — CEFAZOLIN SODIUM-DEXTROSE 2-4 GM/100ML-% IV SOLN
2.0000 g | INTRAVENOUS | Status: AC
  Administered 2019-11-06: 2 g via INTRAVENOUS

## 2019-11-06 MED ORDER — FUROSEMIDE 20 MG PO TABS
20.0000 mg | ORAL_TABLET | Freq: Every day | ORAL | Status: DC
  Administered 2019-11-07: 20 mg via ORAL
  Filled 2019-11-06: qty 1

## 2019-11-06 MED ORDER — EPHEDRINE SULFATE 50 MG/ML IJ SOLN
INTRAMUSCULAR | Status: DC | PRN
  Administered 2019-11-06: 15 mg via INTRAVENOUS
  Administered 2019-11-06 (×2): 10 mg via INTRAVENOUS
  Administered 2019-11-06: 15 mg via INTRAVENOUS

## 2019-11-06 MED ORDER — ONDANSETRON HCL 4 MG/2ML IJ SOLN
INTRAMUSCULAR | Status: DC | PRN
  Administered 2019-11-06: 4 mg via INTRAVENOUS

## 2019-11-06 MED ORDER — ONDANSETRON HCL 4 MG/2ML IJ SOLN
INTRAMUSCULAR | Status: AC
  Filled 2019-11-06: qty 2

## 2019-11-06 MED ORDER — CHLORHEXIDINE GLUCONATE 0.12 % MT SOLN
15.0000 mL | Freq: Once | OROMUCOSAL | Status: AC
  Administered 2019-11-06: 15 mL via OROMUCOSAL

## 2019-11-06 MED ORDER — ACETAMINOPHEN 10 MG/ML IV SOLN
INTRAVENOUS | Status: DC | PRN
  Administered 2019-11-06: 1000 mg via INTRAVENOUS

## 2019-11-06 MED ORDER — PANTOPRAZOLE SODIUM 40 MG PO TBEC
40.0000 mg | DELAYED_RELEASE_TABLET | Freq: Every day | ORAL | Status: DC
  Administered 2019-11-07: 40 mg via ORAL
  Filled 2019-11-06: qty 1

## 2019-11-06 MED ORDER — ROCURONIUM BROMIDE 10 MG/ML (PF) SYRINGE
PREFILLED_SYRINGE | INTRAVENOUS | Status: AC
  Filled 2019-11-06: qty 10

## 2019-11-06 MED ORDER — HYDROCODONE-ACETAMINOPHEN 5-325 MG PO TABS: 1.0000 | ORAL_TABLET | Freq: Four times a day (QID) | ORAL | 0 refills | Status: DC | PRN

## 2019-11-06 MED ORDER — PROPOFOL 10 MG/ML IV BOLUS
INTRAVENOUS | Status: DC | PRN
  Administered 2019-11-06: 120 mg via INTRAVENOUS
  Administered 2019-11-06: 20 mg via INTRAVENOUS
  Administered 2019-11-06: 30 mg via INTRAVENOUS

## 2019-11-06 MED ORDER — ATORVASTATIN CALCIUM 20 MG PO TABS
40.0000 mg | ORAL_TABLET | Freq: Every day | ORAL | Status: DC
  Administered 2019-11-06: 40 mg via ORAL
  Filled 2019-11-06: qty 2

## 2019-11-06 MED ORDER — CHLORHEXIDINE GLUCONATE CLOTH 2 % EX PADS: 6.0000 | MEDICATED_PAD | Freq: Once | CUTANEOUS | Status: DC

## 2019-11-06 MED ORDER — SODIUM CHLORIDE (PF) 0.9 % IJ SOLN
INTRAMUSCULAR | Status: AC
  Filled 2019-11-06: qty 10

## 2019-11-06 MED ORDER — EPHEDRINE 5 MG/ML INJ
INTRAVENOUS | Status: AC
  Filled 2019-11-06: qty 10

## 2019-11-06 MED ORDER — ASPIRIN 81 MG PO TBEC: 81.0000 mg | DELAYED_RELEASE_TABLET | Freq: Every day | ORAL | 12 refills | Status: DC

## 2019-11-06 MED ORDER — DOCUSATE SODIUM 100 MG PO CAPS: 100.0000 mg | ORAL_CAPSULE | Freq: Every day | ORAL | Status: DC | PRN

## 2019-11-06 MED ORDER — SODIUM CHLORIDE 0.9 % IV SOLN: INTRAVENOUS | Status: DC

## 2019-11-06 MED ORDER — ENOXAPARIN SODIUM 40 MG/0.4ML ~~LOC~~ SOLN
40.0000 mg | SUBCUTANEOUS | Status: DC
  Administered 2019-11-07: 40 mg via SUBCUTANEOUS
  Filled 2019-11-06: qty 0.4

## 2019-11-06 MED ORDER — DEXAMETHASONE SODIUM PHOSPHATE 10 MG/ML IJ SOLN
INTRAMUSCULAR | Status: AC
  Filled 2019-11-06: qty 1

## 2019-11-06 MED ORDER — RANOLAZINE ER 500 MG PO TB12
500.0000 mg | ORAL_TABLET | Freq: Two times a day (BID) | ORAL | Status: DC
  Administered 2019-11-06 – 2019-11-07 (×2): 500 mg via ORAL
  Filled 2019-11-06 (×3): qty 1

## 2019-11-06 MED ORDER — FENTANYL CITRATE (PF) 100 MCG/2ML IJ SOLN
INTRAMUSCULAR | Status: DC | PRN
  Administered 2019-11-06 (×2): 50 ug via INTRAVENOUS

## 2019-11-06 MED ORDER — FAMOTIDINE 20 MG PO TABS
20.0000 mg | ORAL_TABLET | Freq: Every day | ORAL | Status: DC
  Administered 2019-11-06: 20 mg via ORAL
  Filled 2019-11-06: qty 1

## 2019-11-06 MED ORDER — LIDOCAINE-EPINEPHRINE (PF) 1 %-1:200000 IJ SOLN
INTRAMUSCULAR | Status: DC | PRN
  Administered 2019-11-06: 20 mL

## 2019-11-06 SURGICAL SUPPLY — 52 items
ANCHOR TIS RET SYS 235ML (MISCELLANEOUS) ×4 IMPLANT
BAG INFUSER PRESSURE 100CC (MISCELLANEOUS) ×4 IMPLANT
BLADE SURG SZ11 CARB STEEL (BLADE) ×4 IMPLANT
CANISTER SUCT 1200ML W/VALVE (MISCELLANEOUS) ×4 IMPLANT
CANNULA REDUC XI 12-8 STAPL (CANNULA) ×1
CANNULA REDUC XI 12-8MM STAPL (CANNULA) ×1
CANNULA REDUCER 12-8 DVNC XI (CANNULA) ×2 IMPLANT
CHLORAPREP W/TINT 26 (MISCELLANEOUS) ×4 IMPLANT
CLIP VESOLOCK MED LG 6/CT (CLIP) ×4 IMPLANT
COVER TIP SHEARS 8 DVNC (MISCELLANEOUS) ×2 IMPLANT
COVER TIP SHEARS 8MM DA VINCI (MISCELLANEOUS) ×2
COVER WAND RF STERILE (DRAPES) ×4 IMPLANT
DECANTER SPIKE VIAL GLASS SM (MISCELLANEOUS) ×8 IMPLANT
DEFOGGER SCOPE WARMER CLEARIFY (MISCELLANEOUS) ×4 IMPLANT
DERMABOND ADVANCED (GAUZE/BANDAGES/DRESSINGS) ×2
DERMABOND ADVANCED .7 DNX12 (GAUZE/BANDAGES/DRESSINGS) ×2 IMPLANT
DRAPE ARM DVNC X/XI (DISPOSABLE) ×8 IMPLANT
DRAPE COLUMN DVNC XI (DISPOSABLE) ×2 IMPLANT
DRAPE DA VINCI XI ARM (DISPOSABLE) ×8
DRAPE DA VINCI XI COLUMN (DISPOSABLE) ×2
ELECT CAUTERY BLADE 6.4 (BLADE) ×4 IMPLANT
ELECT REM PT RETURN 9FT ADLT (ELECTROSURGICAL) ×4
ELECTRODE REM PT RTRN 9FT ADLT (ELECTROSURGICAL) ×2 IMPLANT
GLOVE BIOGEL PI IND STRL 7.0 (GLOVE) ×4 IMPLANT
GLOVE BIOGEL PI INDICATOR 7.0 (GLOVE) ×4
GLOVE SURG SYN 6.5 ES PF (GLOVE) ×8 IMPLANT
GOWN STRL REUS W/ TWL LRG LVL3 (GOWN DISPOSABLE) ×8 IMPLANT
GOWN STRL REUS W/TWL LRG LVL3 (GOWN DISPOSABLE) ×8
GRASPER SUT TROCAR 14GX15 (MISCELLANEOUS) IMPLANT
IRRIGATOR SUCT 8 DISP DVNC XI (IRRIGATION / IRRIGATOR) ×2 IMPLANT
IRRIGATOR SUCTION 8MM XI DISP (IRRIGATION / IRRIGATOR) ×2
IV NS 1000ML (IV SOLUTION) ×2
IV NS 1000ML BAXH (IV SOLUTION) ×2 IMPLANT
LABEL OR SOLS (LABEL) ×4 IMPLANT
NEEDLE HYPO 22GX1.5 SAFETY (NEEDLE) ×4 IMPLANT
NEEDLE INSUFFLATION 14GA 120MM (NEEDLE) ×4 IMPLANT
NS IRRIG 500ML POUR BTL (IV SOLUTION) ×4 IMPLANT
OBTURATOR OPTICAL STANDARD 8MM (TROCAR) ×2
OBTURATOR OPTICAL STND 8 DVNC (TROCAR) ×2
OBTURATOR OPTICALSTD 8 DVNC (TROCAR) ×2 IMPLANT
PACK LAP CHOLECYSTECTOMY (MISCELLANEOUS) ×4 IMPLANT
PENCIL ELECTRO HAND CTR (MISCELLANEOUS) ×4 IMPLANT
SEAL CANN UNIV 5-8 DVNC XI (MISCELLANEOUS) ×6 IMPLANT
SEAL XI 5MM-8MM UNIVERSAL (MISCELLANEOUS) ×6
SET TUBE SMOKE EVAC HIGH FLOW (TUBING) ×4 IMPLANT
SOLUTION ELECTROLUBE (MISCELLANEOUS) ×4 IMPLANT
STAPLER CANNULA SEAL DVNC XI (STAPLE) ×2 IMPLANT
STAPLER CANNULA SEAL XI (STAPLE) ×2
SUT MNCRL 4-0 (SUTURE) ×4
SUT MNCRL 4-0 27XMFL (SUTURE) ×4
SUT VICRYL 0 AB UR-6 (SUTURE) ×4 IMPLANT
SUTURE MNCRL 4-0 27XMF (SUTURE) ×4 IMPLANT

## 2019-11-06 NOTE — H&P (Signed)
Subjective:   CC: Acute cholecystitis [K81.0]   HPI:  Andres Gutierrez is a 86 y.o. male who s/p uneventful lap chole.  Staying overnight for observation per care taker request.   Current Medications: has a current medication list which includes the following prescription(s): acetaminophen, ascorbic acid (vitamin c), atorvastatin, cyanocobalamin, digoxin, docusate, famotidine, ferrous sulfate, furosemide, metoprolol tartrate, multivitamin, nitroglycerin, omeprazole, pantoprazole, polyethylene glycol, ranolazine, sucralfate, tamsulosin, trelegy ellipta, and oxycodone-acetaminophen.  Allergies:       Allergies  Allergen Reactions  . Levofloxacin Unknown    [name], her daughter, said Levaquin should be avoided because when patient had Levaquin a few years ago, he had "a bad reaction".  She said he could not walk and could not lift up his feet.  . Benadryl [Diphenhydramine Hcl] Unknown  . Dexamethasone Other (See Comments)    Reaction:  Unknown   . Diltiazem Rash  . Escitalopram Other (See Comments)    Makes him feel faint  . Maxidex [Dexamethasone Sodium Phosphate] Unknown  . Prednisone Other (See Comments)    Felt crazy and stopped it  . Serotonin 5ht-3 Antagonists Dizziness  . Vytorin 10-10 [Ezetimibe-Simvastatin] Unknown  . Zocor [Simvastatin] Unknown    ROS: Unable to obtain due to baseline mentation    Objective:   BP 135/55   Pulse 67   Ht 193 cm (6\' 4" )   Wt 87.5 kg (193 lb)   BMI 23.49 kg/m   Constitutional :  alert, appears stated age, cooperative and no distress  Gastrointestinal: soft, appropriate TTP around incisions.  bowel sounds normal; no masses,  no organomegaly.    Musculoskeletal: Steady gait and movement  Skin: Incisions c/d/i.  Drain site dressing intact  Psychiatric: Normal affect, non-agitated, not confused       LABS:  N/A   RADS: N/A  Assessment:      Chronic cholecystitis, s/p lap chole  Plan:    caretaker  requested 24hr obs.  ADAT, pain control, no abx required. Uneventful surgery.  Hopefully home tomorrow

## 2019-11-06 NOTE — Anesthesia Postprocedure Evaluation (Signed)
Anesthesia Post Note  Patient: Andres Gutierrez  Procedure(s) Performed: XI ROBOTIC ASSISTED LAPAROSCOPIC CHOLECYSTECTOMY (N/A Abdomen) INDOCYANINE GREEN FLUORESCENCE IMAGING (ICG) (N/A )  Patient location during evaluation: PACU Anesthesia Type: General Level of consciousness: awake and alert Pain management: pain level controlled Vital Signs Assessment: post-procedure vital signs reviewed and stable Respiratory status: spontaneous breathing and respiratory function stable Cardiovascular status: stable Anesthetic complications: no   No complications documented.   Last Vitals:  Vitals:   11/06/19 1059 11/06/19 1105  BP:  (!) 109/52  Pulse: 68 66  Resp: (!) 27 19  Temp:    SpO2: 95% 99%    Last Pain:  Vitals:   11/06/19 1105  TempSrc:   PainSc: Asleep                 Odysseus Cada K

## 2019-11-06 NOTE — Interval H&P Note (Signed)
History and Physical Interval Note:  11/06/2019 7:37 AM  Andres Gutierrez  has presented today for surgery, with the diagnosis of K81.0 Acute cholecystitis.  The various methods of treatment have been discussed with the patient and family. After consideration of risks, benefits and other options for treatment, the patient has consented to  Procedure(s): XI ROBOTIC ASSISTED LAPAROSCOPIC CHOLECYSTECTOMY (N/A) [place] (ICG) (N/A) as a surgical intervention.  The patient's history has been reviewed, patient examined, no change in status, stable for surgery.  I have reviewed the patient's chart and labs.  Questions were answered to the patient's satisfaction.     Marjarie Irion Lysle Pearl

## 2019-11-06 NOTE — Anesthesia Procedure Notes (Signed)
Procedure Name: Intubation Date/Time: 11/06/2019 8:06 AM Performed by: Letitia Neri, CRNA Pre-anesthesia Checklist: Patient identified, Patient being monitored, Timeout performed, Emergency Drugs available and Suction available Patient Re-evaluated:Patient Re-evaluated prior to induction Oxygen Delivery Method: Circle system utilized Preoxygenation: Pre-oxygenation with 100% oxygen Induction Type: IV induction Ventilation: Mask ventilation without difficulty Laryngoscope Size: Mac and 3 Grade View: Grade I Tube type: Oral Tube size: 7.0 mm Number of attempts: 1 Placement Confirmation: ETT inserted through vocal cords under direct vision,  positive ETCO2 and breath sounds checked- equal and bilateral Secured at: 21 cm Tube secured with: Tape Dental Injury: Teeth and Oropharynx as per pre-operative assessment

## 2019-11-06 NOTE — Anesthesia Preprocedure Evaluation (Signed)
Anesthesia Evaluation  Patient identified by MRN, date of birth, ID band Patient awake    Reviewed: Allergy & Precautions, NPO status , Patient's Chart, lab work & pertinent test results  History of Anesthesia Complications Negative for: history of anesthetic complications  Airway Mallampati: II       Dental  (+) Poor Dentition, Chipped, Missing   Pulmonary neg sleep apnea, neg COPD, Not current smoker, former smoker,           Cardiovascular hypertension, Pt. on medications + Past MI, + Cardiac Stents, + CABG and +CHF  (-) pacemaker(-) Valvular Problems/Murmurs     Neuro/Psych neg Seizures Anxiety    GI/Hepatic Neg liver ROS, GERD  Medicated and Controlled,  Endo/Other  neg diabetes  Renal/GU Renal InsufficiencyRenal disease     Musculoskeletal   Abdominal   Peds  Hematology  (+) anemia ,   Anesthesia Other Findings   Reproductive/Obstetrics                             Anesthesia Physical Anesthesia Plan  ASA: III  Anesthesia Plan: General   Post-op Pain Management:    Induction: Intravenous  PONV Risk Score and Plan: 2 and Ondansetron and Dexamethasone  Airway Management Planned: Oral ETT  Additional Equipment:   Intra-op Plan:   Post-operative Plan:   Informed Consent: I have reviewed the patients History and Physical, chart, labs and discussed the procedure including the risks, benefits and alternatives for the proposed anesthesia with the patient or authorized representative who has indicated his/her understanding and acceptance.       Plan Discussed with:   Anesthesia Plan Comments:         Anesthesia Quick Evaluation

## 2019-11-06 NOTE — Plan of Care (Signed)
Continuing with plan of care. 

## 2019-11-06 NOTE — Transfer of Care (Signed)
Immediate Anesthesia Transfer of Care Note  Patient: Andres Gutierrez  Procedure(s) Performed: XI ROBOTIC ASSISTED LAPAROSCOPIC CHOLECYSTECTOMY (N/A Abdomen) INDOCYANINE GREEN FLUORESCENCE IMAGING (ICG) (N/A )  Patient Location: PACU  Anesthesia Type:General  Level of Consciousness: sedated  Airway & Oxygen Therapy: Patient Spontanous Breathing  Post-op Assessment: Report given to RN and Post -op Vital signs reviewed and stable  Post vital signs: Reviewed and stable  Last Vitals:  Vitals Value Taken Time  BP 110/62 11/06/19 1035  Temp 36.1 C 11/06/19 1020  Pulse 72 11/06/19 1041  Resp 19 11/06/19 1041  SpO2 95 % 11/06/19 1041  Vitals shown include unvalidated device data.  Last Pain:  Vitals:   11/06/19 1020  TempSrc:   PainSc: Asleep         Complications: No complications documented.

## 2019-11-06 NOTE — H&P (Signed)
Subjective:   CC: Acute cholecystitis [K81.0]   HPI:  Andres Gutierrez is a 86 y.o. male who is here for followup from above.  Drain replaced and still continuing to drain. No N/V.       Current Medications: has a current medication list which includes the following prescription(s): acetaminophen, ascorbic acid (vitamin c), atorvastatin, cyanocobalamin, digoxin, docusate, famotidine, ferrous sulfate, furosemide, metoprolol tartrate, multivitamin, nitroglycerin, omeprazole, pantoprazole, polyethylene glycol, ranolazine, sucralfate, tamsulosin, trelegy ellipta, and oxycodone-acetaminophen.  Allergies:       Allergies  Allergen Reactions  . Levofloxacin Unknown    [name], her daughter, said Levaquin should be avoided because when patient had Levaquin a few years ago, he had "a bad reaction".  She said he could not walk and could not lift up his feet.  . Benadryl [Diphenhydramine Hcl] Unknown  . Dexamethasone Other (See Comments)    Reaction:  Unknown   . Diltiazem Rash  . Escitalopram Other (See Comments)    Makes him feel faint  . Maxidex [Dexamethasone Sodium Phosphate] Unknown  . Prednisone Other (See Comments)    Felt crazy and stopped it  . Serotonin 5ht-3 Antagonists Dizziness  . Vytorin 10-10 [Ezetimibe-Simvastatin] Unknown  . Zocor [Simvastatin] Unknown    ROS: Unable to obtain due to baseline mentation    Objective:   BP 135/55   Pulse 67   Ht 193 cm (6\' 4" )   Wt 87.5 kg (193 lb)   BMI 23.49 kg/m   Constitutional :  alert, appears stated age, cooperative and no distress  Gastrointestinal: soft, non-tender; bowel sounds normal; no masses,  no organomegaly.    Musculoskeletal: Steady gait and movement  Skin: Cool and moist, cholecystostomy drain in place, with darler bilious output.  Psychiatric: Normal affect, non-agitated, not confused       LABS:  N/A   RADS: N/A  Assessment:      Acute cholecystitis [K81.0] s/p IR drain  placement, doing well for now. CAD, low EF Pulmonary cyst?   Plan:    Family will like to consider possible surgery.  Discussed the risk of surgery including post-op infxn, seroma, biloma, chronic pain, poor-delayed wound healing, retained gallstone, conversion to open procedure, post-op SBO or ileus, and need for additional procedures to address said risks.  The risks of general anesthetic including MI, CVA, sudden death or even reaction to anesthetic medications also discussed. Alternatives include continued observation.  Benefits include possible symptom relief, prevention of complications including acute cholecystitis, pancreatitis.  Typical post operative recovery of 3-5 days rest, continued pain in area and incision sites, possible loose stools up to 4-6 weeks, also discussed.

## 2019-11-06 NOTE — Op Note (Signed)
Preoperative diagnosis:  chronic and cholecystitis  Postoperative diagnosis: same as above  Procedure: Robotic assisted Laparoscopic Cholecystectomy.   Anesthesia: GETA   Surgeon: Benjamine Sprague  Specimen: Gallbladder  Complications: None  EBL: 53mL  Wound Classification: Clean Contaminated  Indications: see HPI  Findings: Critical view of safety noted Cystic duct and artery identified, ligated and divided, clips remained intact at end of procedure Adequate hemostasis  Description of procedure:  The patient was placed on the operating table in the supine position. SCDs placed, pre-op abx administered.  General anesthesia was induced and OG tube placed by anesthesia. A time-out was completed verifying correct patient, procedure, site, positioning, and implant(s) and/or special equipment prior to beginning this procedure. The abdomen was prepped and draped in the usual sterile fashion.    Veress needle was placed at the Palmer's point and insufflation was started after confirming a positive saline drop test and no immediate increase in abdominal pressure.  After reaching 15 mm, the Veress needle was removed and a 8 mm port was placed via optiview technique under umbilicus measured 56YB from gallbladder.  The abdomen was inspected and no abnormalities or injuries were found.  Under direct vision, ports were placed in the following locations: One 12 mm patient left of the umbilicus, 8cm from the optiviewed port, one 8 mm port placed to the patient right of the umbilical port 8 cm apart.  1 additional 8 mm port placed lateral to the 18mm port.  Once ports were placed, The table was placed in the reverse Trendelenburg position with the right side up. The Xi platform was brought into the operative field and docked to the ports successfully.  An endoscope was placed through the umbilical port, fenestrated grasper through the adjacent patient right port, prograsp to the far patient left port, and  then a hook cautery in the left port.  The dome of the gallbladder was grasped with prograsp, passed and retracted over the dome of the liver. Adhesions between the gallbladder and omentum, duodenum and transverse colon were lysed via hook cautery. The infundibulum was grasped with the fenestrated grasper and retracted toward the right lower quadrant. This maneuver exposed Calot's triangle. The peritoneum overlying the gallbladder infundibulum was then dissected using combination of [place] dissector and electrocautery hook and the cystic duct and cystic artery identified.  Critical view of safety with the liver bed clearly visible behind the duct and artery with no additional structures noted.  The cystic duct and cystic artery clipped and divided close to the gallbladder.  1 robotic clip was placed at the base of the cystic duct, two clip for the cystic artery.   The gallbladder was then dissected from its peritoneal and liver bed attachments by electrocautery. Drain was removed at bedside prior to completing removal of gallbladder.  Hemostasis was checked prior to removing the hook cautery and the Endo Catch bag was then placed through the 12 mm port and the gallbladder was removed.  The gallbladder was passed off the table as a specimen. There was no evidence of bleeding from the gallbladder fossa or cystic artery or leakage of the bile from the cystic duct stump or liverbed. The 12 mm port site closed with PMI using 0 vicryl under direct vision.  Abdomen desufflated and secondary trocars were removed under direct vision. No bleeding was noted. All skin incisions then closed with subcuticular sutures of 4-0 monocryl and dressed with topical skin adhesive. The orogastric tube was removed and patient extubated.  The  patient tolerated the procedure well and was taken to the postanesthesia care unit in stable condition.  All sponge and instrument count correct at end of procedure.

## 2019-11-06 NOTE — Discharge Instructions (Signed)
Laparoscopic Cholecystectomy, Care After This sheet gives you information about how to care for yourself after your procedure. Your doctor may also give you more specific instructions. If you have problems or questions, contact your doctor. Follow these instructions at home: Care for cuts from surgery (incisions)   Follow instructions from your doctor about how to take care of your cuts from surgery. Make sure you: ? Wash your hands with soap and water before you change your bandage (dressing). If you cannot use soap and water, use hand sanitizer. ? Change your bandage as told by your doctor. ? Leave stitches (sutures), skin glue, or skin tape (adhesive) strips in place. They may need to stay in place for 2 weeks or longer. If tape strips get loose and curl up, you may trim the loose edges. Do not remove tape strips completely unless your doctor says it is okay.  Do not take baths, swim, or use a hot tub until your doctor says it is okay. OK TO REMOVE DRAIN SITE DRESSING AND SHOWER 24HRS AFTER YOUR SURGERY. KEEP DRAIN SITE COVERED WITH DAILY BANDAID CHANGES UNTIL HEALED.  Check your surgical cut area every day for signs of infection. Check for: ? More redness, swelling, or pain. ? More fluid or blood. ? Warmth. ? Pus or a bad smell. Activity  Do not drive or use heavy machinery while taking prescription pain medicine.  Do not play contact sports until your doctor says it is okay.  Do not drive for 24 hours if you were given a medicine to help you relax (sedative).  Rest as needed. Do not return to work or school until your doctor says it is okay. General instructions .  tylenol and advil as needed for discomfort.  Please alternate between the two every four hours as needed for pain.   .  Use narcotics, if prescribed, only when tylenol and motrin is not enough to control pain. .  325-650mg  every 8hrs to max of 3000mg /24hrs (including the 325mg  in every norco dose) for the tylenol.   .   Advil up to 400mg  per dose every 8hrs as needed for pain.    To prevent or treat constipation while you are taking prescription pain medicine, your doctor may recommend that you: ? Drink enough fluid to keep your pee (urine) clear or pale yellow. ? Take over-the-counter or prescription medicines. ? Eat foods that are high in fiber, such as fresh fruits and vegetables, whole grains, and beans. ? Limit foods that are high in fat and processed sugars, such as fried and sweet foods. Contact a doctor if:  You develop a rash.  You have more redness, swelling, or pain around your surgical cuts.  You have more fluid or blood coming from your surgical cuts.  Your surgical cuts feel warm to the touch.  You have pus or a bad smell coming from your surgical cuts.  You have a fever.  One or more of your surgical cuts breaks open. Get help right away if:  You have trouble breathing.  You have chest pain.  You have pain that is getting worse in your shoulders.  You faint or feel dizzy when you stand.  You have very bad pain in your belly (abdomen).  You are sick to your stomach (nauseous) for more than one day.  You have throwing up (vomiting) that lasts for more than one day.  You have leg pain. This information is not intended to replace advice given to you by  your health care provider. Make sure you discuss any questions you have with your health care provider. Document Released: 02/07/2008 Document Revised: 11/19/2015 Document Reviewed: 10/17/2015 Elsevier Interactive Patient Education  2019 [employer].

## 2019-11-07 DIAGNOSIS — K819 Cholecystitis, unspecified: Secondary | ICD-10-CM | POA: Diagnosis not present

## 2019-11-07 NOTE — Plan of Care (Signed)
Continuing with plan of care. 

## 2019-11-07 NOTE — Plan of Care (Signed)
Discharge teaching completed with patient who verbalized understanding.  Patient is in stable condition. 

## 2019-11-07 NOTE — Discharge Summary (Signed)
Patient ID: BRENNYN HAISLEY MRN: [id number] DOB/AGE: [id number] y.o.  Admit date: 11/06/2019 Discharge date: 11/07/2019   Discharge Diagnoses:  Active Problems:   Chronic cholecystitis   Procedures: Robotic assisted laparoscopic cholecystectomy  Hospital Course: Patient admitted for robotic assisted laparoscopic cholecystectomy.  He tolerated procedure well.  Patient stay overnight for observation due to difficulty of patient care at home due to advanced age.  Otherwise patient is morning tolerating breakfast, eating pancakes.  The pain is controlled.  The wounds are dry and clean.  Patient feeling great and pain control.  Physical Exam Constitutional:      Appearance: Normal appearance.  Cardiovascular:     Rate and Rhythm: Normal rate and regular rhythm.  Pulmonary:     Effort: Pulmonary effort is normal.     Breath sounds: Normal breath sounds.  Abdominal:     General: Abdomen is flat. Bowel sounds are normal. There is no distension.     Palpations: Abdomen is soft.     Tenderness: There is no abdominal tenderness. There is no guarding.  Neurological:     Mental Status: He is alert.      Consults: None  Disposition: Discharge disposition: 01-Home or Self Care       Discharge Instructions    Diet - low sodium heart healthy   Complete by: As directed      Allergies as of 11/07/2019      Reactions   Levaquin [levofloxacin]    [name], her daughter, said Levaquin should be avoided because when patient had Levaquin a few years ago, he had "a bad reaction".  She said he could not walk and could not lift up his feet.   5ht3 Receptor Antagonists    Diphenhydramine Hcl Other (See Comments)   Reaction:  Rash and fever a long time ago, but has had it since with no problem.   Escitalopram Other (See Comments)   Reaction:  Makes him feel faint, like he was going to have a heart attack.   Maxidex [dexamethasone] Other (See Comments)   Reaction:  Unknown    Prednisone  Other (See Comments)   Pt states that this medication made him feel crazy.     Serotonin Other (See Comments)   Tried 2 different types, lexapro and another one.  Reaction:  Made him feel like he was having a heart attack.    Vytorin [ezetimibe-simvastatin] Other (See Comments)   Reaction:  Unknown    Zocor [simvastatin] Other (See Comments)   Reaction:  Unknown    Diltiazem Rash      Medication List    STOP taking these medications   sodium chloride (PF) 0.9 % injection     TAKE these medications   acetaminophen 325 MG tablet Commonly known as: Tylenol Take 2 tablets (650 mg total) by mouth every 8 (eight) hours as needed for mild pain. What changed:   medication strength  how much to take  when to take this  reasons to take this   aspirin 81 MG EC tablet Take 1 tablet (81 mg total) by mouth daily. RESUME 48HRS after surgery What changed: additional instructions Notes to patient: RESUME 48HRS AFTER SURGERY   atorvastatin 40 MG tablet Commonly known as: LIPITOR Take 1 tablet (40 mg total) by mouth daily at 6 PM. What changed: when to take this   digoxin 0.125 MG tablet Commonly known as: LANOXIN Take 0.0625 mg by mouth daily.   docusate sodium 100 MG capsule Commonly known as:  COLACE Take 100 mg by mouth daily as needed for mild constipation.   famotidine 20 MG tablet Commonly known as: PEPCID Take 20 mg by mouth at bedtime.   ferrous sulfate 325 (65 FE) MG tablet Take 1 tablet (325 mg total) by mouth every other day. What changed:   when to take this  additional instructions   furosemide 40 MG tablet Commonly known as: LASIX Take 1 tablet (40 mg total) by mouth daily. What changed: how much to take   HYDROcodone-acetaminophen 5-325 MG tablet Commonly known as: Norco Take 1 tablet by mouth every 6 (six) hours as needed for up to 6 doses for moderate pain.   ibuprofen 400 MG tablet Commonly known as: ADVIL Take 2 tablets (800 mg total) by mouth  every 8 (eight) hours as needed for mild pain or moderate pain.   neomycin-bacitracin-polymyxin ointment Commonly known as: NEOSPORIN Apply 1 application topically every other day. Wound care (foot area)   nitroGLYCERIN 0.4 MG SL tablet Commonly known as: NITROSTAT Place 0.4 mg under the tongue every 5 (five) minutes as needed for chest pain.   pantoprazole 40 MG tablet Commonly known as: PROTONIX Take 40 mg by mouth daily.   polyethylene glycol 17 g packet Commonly known as: MIRALAX / GLYCOLAX Take 17 g by mouth daily as needed for mild constipation.   ranolazine 1000 MG SR tablet Commonly known as: RANEXA Take 500 mg by mouth 2 (two) times daily.   sucralfate 1 g tablet Commonly known as: CARAFATE Take 1 g by mouth 2 (two) times daily.   tamsulosin 0.4 MG Caps capsule Commonly known as: FLOMAX Take 0.4 mg by mouth daily after supper.   vitamin B-12 500 MCG tablet Commonly known as: CYANOCOBALAMIN Take 500 mcg by mouth daily.   VITAMIN C PO Take 1 tablet by mouth daily.       Follow-up Information    Follow up In 2 weeks.   Why: post op lap chole

## 2019-11-09 DIAGNOSIS — L97522 Non-pressure chronic ulcer of other part of left foot with fat layer exposed: Secondary | ICD-10-CM | POA: Diagnosis not present

## 2019-11-09 DIAGNOSIS — L97521 Non-pressure chronic ulcer of other part of left foot limited to breakdown of skin: Secondary | ICD-10-CM | POA: Diagnosis not present

## 2019-11-09 DIAGNOSIS — G609 Hereditary and idiopathic neuropathy, unspecified: Secondary | ICD-10-CM | POA: Diagnosis not present

## 2019-11-09 LAB — SURGICAL PATHOLOGY

## 2019-11-11 ENCOUNTER — Other Ambulatory Visit: Payer: Self-pay

## 2019-11-11 ENCOUNTER — Inpatient Hospital Stay: Payer: PPO

## 2019-11-11 VITALS — BP 118/53 | HR 64

## 2019-11-11 DIAGNOSIS — I129 Hypertensive chronic kidney disease with stage 1 through stage 4 chronic kidney disease, or unspecified chronic kidney disease: Secondary | ICD-10-CM | POA: Diagnosis not present

## 2019-11-11 DIAGNOSIS — D631 Anemia in chronic kidney disease: Secondary | ICD-10-CM

## 2019-11-11 DIAGNOSIS — D500 Iron deficiency anemia secondary to blood loss (chronic): Secondary | ICD-10-CM

## 2019-11-11 LAB — CBC WITH DIFFERENTIAL/PLATELET
Abs Immature Granulocytes: 0.3 10*3/uL — ABNORMAL HIGH (ref 0.00–0.07)
Basophils Absolute: 0 10*3/uL (ref 0.0–0.1)
Basophils Relative: 0 %
Eosinophils Absolute: 0.1 10*3/uL (ref 0.0–0.5)
Eosinophils Relative: 1 %
HCT: 29 % — ABNORMAL LOW (ref 39.0–52.0)
Hemoglobin: 9.4 g/dL — ABNORMAL LOW (ref 13.0–17.0)
Immature Granulocytes: 2 %
Lymphocytes Relative: 17 %
Lymphs Abs: 2.3 10*3/uL (ref 0.7–4.0)
MCH: 33.7 pg (ref 26.0–34.0)
MCHC: 32.4 g/dL (ref 30.0–36.0)
MCV: 103.9 fL — ABNORMAL HIGH (ref 80.0–100.0)
Monocytes Absolute: 3.8 10*3/uL — ABNORMAL HIGH (ref 0.1–1.0)
Monocytes Relative: 29 %
Neutro Abs: 6.7 10*3/uL (ref 1.7–7.7)
Neutrophils Relative %: 51 %
Platelets: 159 10*3/uL (ref 150–400)
RBC: 2.79 MIL/uL — ABNORMAL LOW (ref 4.22–5.81)
RDW: 17.9 % — ABNORMAL HIGH (ref 11.5–15.5)
WBC: 13.2 10*3/uL — ABNORMAL HIGH (ref 4.0–10.5)
nRBC: 0 % (ref 0.0–0.2)

## 2019-11-11 LAB — BASIC METABOLIC PANEL
Anion gap: 9 (ref 5–15)
BUN: 21 mg/dL (ref 8–23)
CO2: 26 mmol/L (ref 22–32)
Calcium: 9 mg/dL (ref 8.9–10.3)
Chloride: 104 mmol/L (ref 98–111)
Creatinine, Ser: 1.51 mg/dL — ABNORMAL HIGH (ref 0.61–1.24)
GFR calc Af Amer: 48 mL/min — ABNORMAL LOW (ref >60)
GFR calc non Af Amer: 41 mL/min — ABNORMAL LOW (ref >60)
Glucose, Bld: 112 mg/dL — ABNORMAL HIGH (ref 70–99)
Potassium: 4.6 mmol/L (ref 3.5–5.1)
Sodium: 139 mmol/L (ref 135–145)

## 2019-11-11 LAB — LACTATE DEHYDROGENASE: LDH: 144 U/L (ref 98–192)

## 2019-11-11 MED ORDER — DARBEPOETIN ALFA 300 MCG/0.6ML IJ SOSY
300.0000 ug | PREFILLED_SYRINGE | Freq: Once | INTRAMUSCULAR | Status: AC
  Administered 2019-11-11: 300 ug via SUBCUTANEOUS
  Filled 2019-11-11: qty 0.6

## 2019-11-23 DIAGNOSIS — L97522 Non-pressure chronic ulcer of other part of left foot with fat layer exposed: Secondary | ICD-10-CM | POA: Diagnosis not present

## 2019-11-23 DIAGNOSIS — G609 Hereditary and idiopathic neuropathy, unspecified: Secondary | ICD-10-CM | POA: Diagnosis not present

## 2019-11-23 DIAGNOSIS — L97511 Non-pressure chronic ulcer of other part of right foot limited to breakdown of skin: Secondary | ICD-10-CM | POA: Diagnosis not present

## 2019-11-24 ENCOUNTER — Encounter: Payer: Self-pay | Admitting: Internal Medicine

## 2019-11-24 NOTE — Progress Notes (Signed)
Patient prescreened for appointment with assistance of daughter. Patient's daughter has no concerns or questions.

## 2019-11-25 ENCOUNTER — Inpatient Hospital Stay: Payer: PPO

## 2019-11-25 ENCOUNTER — Other Ambulatory Visit: Payer: Self-pay | Admitting: *Deleted

## 2019-11-25 ENCOUNTER — Inpatient Hospital Stay (HOSPITAL_BASED_OUTPATIENT_CLINIC_OR_DEPARTMENT_OTHER): Payer: PPO | Admitting: Internal Medicine

## 2019-11-25 ENCOUNTER — Inpatient Hospital Stay: Payer: PPO | Attending: Internal Medicine

## 2019-11-25 ENCOUNTER — Other Ambulatory Visit: Payer: Self-pay

## 2019-11-25 VITALS — BP 131/67 | HR 77 | Temp 97.8°F | Ht 76.0 in | Wt 188.0 lb

## 2019-11-25 DIAGNOSIS — D631 Anemia in chronic kidney disease: Secondary | ICD-10-CM

## 2019-11-25 DIAGNOSIS — D500 Iron deficiency anemia secondary to blood loss (chronic): Secondary | ICD-10-CM

## 2019-11-25 DIAGNOSIS — D696 Thrombocytopenia, unspecified: Secondary | ICD-10-CM | POA: Diagnosis not present

## 2019-11-25 DIAGNOSIS — K810 Acute cholecystitis: Secondary | ICD-10-CM | POA: Diagnosis not present

## 2019-11-25 DIAGNOSIS — Z87891 Personal history of nicotine dependence: Secondary | ICD-10-CM | POA: Diagnosis not present

## 2019-11-25 DIAGNOSIS — D509 Iron deficiency anemia, unspecified: Secondary | ICD-10-CM | POA: Insufficient documentation

## 2019-11-25 DIAGNOSIS — I429 Cardiomyopathy, unspecified: Secondary | ICD-10-CM | POA: Insufficient documentation

## 2019-11-25 DIAGNOSIS — R0602 Shortness of breath: Secondary | ICD-10-CM | POA: Diagnosis not present

## 2019-11-25 DIAGNOSIS — R911 Solitary pulmonary nodule: Secondary | ICD-10-CM | POA: Diagnosis not present

## 2019-11-25 DIAGNOSIS — I130 Hypertensive heart and chronic kidney disease with heart failure and stage 1 through stage 4 chronic kidney disease, or unspecified chronic kidney disease: Secondary | ICD-10-CM | POA: Insufficient documentation

## 2019-11-25 DIAGNOSIS — Z9049 Acquired absence of other specified parts of digestive tract: Secondary | ICD-10-CM | POA: Diagnosis not present

## 2019-11-25 DIAGNOSIS — I252 Old myocardial infarction: Secondary | ICD-10-CM | POA: Insufficient documentation

## 2019-11-25 DIAGNOSIS — I2510 Atherosclerotic heart disease of native coronary artery without angina pectoris: Secondary | ICD-10-CM | POA: Insufficient documentation

## 2019-11-25 DIAGNOSIS — I509 Heart failure, unspecified: Secondary | ICD-10-CM | POA: Insufficient documentation

## 2019-11-25 DIAGNOSIS — Z79899 Other long term (current) drug therapy: Secondary | ICD-10-CM | POA: Diagnosis not present

## 2019-11-25 DIAGNOSIS — N183 Chronic kidney disease, stage 3 unspecified: Secondary | ICD-10-CM | POA: Diagnosis not present

## 2019-11-25 DIAGNOSIS — Z7982 Long term (current) use of aspirin: Secondary | ICD-10-CM | POA: Insufficient documentation

## 2019-11-25 LAB — CBC WITH DIFFERENTIAL/PLATELET
Abs Immature Granulocytes: 0.19 10*3/uL — ABNORMAL HIGH (ref 0.00–0.07)
Basophils Absolute: 0 10*3/uL (ref 0.0–0.1)
Basophils Relative: 0 %
Eosinophils Absolute: 0.3 10*3/uL (ref 0.0–0.5)
Eosinophils Relative: 3 %
HCT: 27.2 % — ABNORMAL LOW (ref 39.0–52.0)
Hemoglobin: 8.8 g/dL — ABNORMAL LOW (ref 13.0–17.0)
Immature Granulocytes: 2 %
Lymphocytes Relative: 20 %
Lymphs Abs: 2.2 10*3/uL (ref 0.7–4.0)
MCH: 32.5 pg (ref 26.0–34.0)
MCHC: 32.4 g/dL (ref 30.0–36.0)
MCV: 100.4 fL — ABNORMAL HIGH (ref 80.0–100.0)
Monocytes Absolute: 2.9 10*3/uL — ABNORMAL HIGH (ref 0.1–1.0)
Monocytes Relative: 27 %
Neutro Abs: 5.3 10*3/uL (ref 1.7–7.7)
Neutrophils Relative %: 48 %
Platelets: 226 10*3/uL (ref 150–400)
RBC: 2.71 MIL/uL — ABNORMAL LOW (ref 4.22–5.81)
RDW: 16.6 % — ABNORMAL HIGH (ref 11.5–15.5)
Smear Review: NORMAL
WBC: 10.8 10*3/uL — ABNORMAL HIGH (ref 4.0–10.5)
nRBC: 0 % (ref 0.0–0.2)

## 2019-11-25 MED ORDER — DARBEPOETIN ALFA 300 MCG/0.6ML IJ SOSY
300.0000 ug | PREFILLED_SYRINGE | Freq: Once | INTRAMUSCULAR | Status: AC
  Administered 2019-11-25: 300 ug via SUBCUTANEOUS
  Filled 2019-11-25: qty 0.6

## 2019-11-25 NOTE — Progress Notes (Signed)
[place] NOTE  Patient Care Team: Rusty Aus, MD as PCP - General (Internal Medicine) Alisa Graff, FNP as Nurse Practitioner (Family Medicine) Ubaldo Glassing Javier Docker, MD as Consulting Physician (Cardiology) Erby Pian, MD as Referring Physician (Specialist) Cammie Sickle, MD as Consulting Physician (Hematology and Oncology)  CHIEF COMPLAINTS/PURPOSE OF CONSULTATION:   # 2012- Anemia 11-12- sec to CKD/IDA? MDS [no BMBx] [March 2017-sat-11%; ferritin-42] [Feb 2017- EGD/colo-July 2015 Dr.Skulskie];April 2017-PO Iron PO; May 2020-IV iron/Aranesp.  # Intermittent thrombocytopenia 120s-140s [Dec 2016-CT- neg cirrhosis/splenomegaly]  #June 2020 -LUL cystic lesion- 4x4cm ?  Question etiology [slightly increased from 2017]; question postinflammatory versus low-grade adenocarcinoma-repeat scan in 6 months [December 2020]  # CKD [creatinine 1.2- 1.4]; CAD/CHF; March 2021-acalculous cholecystitis/sepsis [ICU]; s/p cholecystostomy tube placement  HISTORY OF PRESENTING ILLNESS:  Andres SEDGWICK 86 y.o.  male history of above history of chronic anemia and intermittent thrombocytopenia is here for follow-up/ currently on IV iron and also Aranesp.  Patient is currently status post definitive cholecystectomy on June 25.  No postoperative complications.  Chronic mild to moderate fatigue.  Shortness of breath on exertion.  Otherwise no nausea vomiting abdominal pain.  No fevers or chills.  No cough  Review of Systems  Constitutional: Positive for malaise/fatigue. Negative for chills, diaphoresis, fever and weight loss.  HENT: Negative for nosebleeds and sore throat.   Eyes: Negative for double vision.  Respiratory: Positive for shortness of breath. Negative for cough, hemoptysis, sputum production and wheezing.   Cardiovascular: Negative for chest pain, palpitations, orthopnea and leg swelling.  Gastrointestinal: Negative for abdominal pain, blood in stool,  constipation, diarrhea, heartburn, melena, nausea and vomiting.  Genitourinary: Negative for dysuria, frequency and urgency.  Musculoskeletal: Positive for back pain and joint pain.  Skin: Negative.  Negative for itching and rash.  Neurological: Negative for dizziness, tingling, focal weakness, weakness and headaches.  Endo/Heme/Allergies: Does not bruise/bleed easily.  Psychiatric/Behavioral: Negative for depression. The patient is not nervous/anxious and does not have insomnia.      MEDICAL HISTORY:  Past Medical History:  Diagnosis Date  . Anemia   . Anxiety   . Cardiomyopathy ([place])   . CHF (congestive heart failure) ([place])   . Chronic kidney disease    kidney stones  . Coronary artery disease   . Cough   . Dyspnea   . Hypertension   . Lymphadenopathy, hilar   . MI (myocardial infarction) ([place])    x 2  . Wheezing     SURGICAL HISTORY: Past Surgical History:  Procedure Laterality Date  . BRONCHIAL NEEDLE ASPIRATION BIOPSY N/A 12/31/2014   Procedure: BRONCHIAL NEEDLE ASPIRATION BIOPSIES from carina;  Surgeon: Flora Lipps, MD;  Location: [facility];  Service: Cardiopulmonary;  Laterality: N/A;  . CARDIAC CATHETERIZATION N/A 12/28/2015   Procedure: Left Heart Cath and Cors/Grafts Angiography;  Surgeon: Yolonda Kida, MD;  Location: [facility];  Service: Cardiovascular;  Laterality: N/A;  . CATARACT EXTRACTION W/PHACO Left 08/01/2016   Procedure: CATARACT EXTRACTION PHACO AND INTRAOCULAR LENS PLACEMENT ([place]) Left;  Surgeon: Leandrew Koyanagi, MD;  Location: [facility];  Service: Ophthalmology;  Laterality: Left;  . COLON SURGERY    . CORONARY ANGIOPLASTY WITH STENT PLACEMENT    . CORONARY ARTERY BYPASS GRAFT    . ENDOBRONCHIAL ULTRASOUND N/A 12/31/2014   Procedure: ENDOBRONCHIAL ULTRASOUND;  Surgeon: Flora Lipps, MD;  Location: [facility];  Service: Cardiopulmonary;  Laterality: N/A;  . ESOPHAGOGASTRODUODENOSCOPY (EGD) WITH PROPOFOL N/A 06/20/2015  Procedure:  ESOPHAGOGASTRODUODENOSCOPY (EGD) WITH PROPOFOL;  Surgeon: Lollie Sails, MD;  Location: [facility];  Service: Endoscopy;  Laterality: N/A;  . IR CATHETER TUBE CHANGE  10/26/2019  . IR CHOLANGIOGRAM EXISTING TUBE  08/13/2019    SOCIAL HISTORY: lives in [place]; alone. 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  Social History   Socioeconomic History  . Marital status: Widowed    Spouse name: Not on file  . Number of children: 2  . Years of education: college  . Highest education level: Some college, no degree  Occupational History  . Occupation: retired  Tobacco Use  . Smoking status: Former Smoker    Quit date: 06/27/1977    Years since quitting: 42.4  . Smokeless tobacco: Current User    Types: Chew  Vaping Use  . Vaping Use: Never used  Substance and Sexual Activity  . Alcohol use: Yes    Comment: occational almost rare once a year  . Drug use: No  . Sexual activity: Never    Birth control/protection: Abstinence  Other Topics Concern  . Not on file  Social History Narrative  . Not on file   Social Determinants of Health   Financial Resource Strain:   . Difficulty of Paying Living Expenses:   Food Insecurity:   . Worried About Charity fundraiser in the Last Year:   . Arboriculturist in the Last Year:   Transportation Needs:   . Film/video editor (Medical):   Marland Kitchen Lack of Transportation (Non-Medical):   Physical Activity:   . Days of Exercise per Week:   . Minutes of Exercise per Session:   Stress:   . Feeling of Stress :   Social Connections:   . Frequency of Communication with Friends and Family:   . Frequency of Social Gatherings with Friends and Family:   . Attends Religious Services:   . Active Member of Clubs or Organizations:   . Attends Archivist Meetings:   Marland Kitchen Marital Status:   Intimate Partner Violence:   . Fear of Current or Ex-Partner:   . Emotionally Abused:   Marland Kitchen Physically Abused:   . Sexually Abused:     FAMILY HISTORY: Family History   Problem Relation Age of Onset  . CAD Mother   . Colon cancer Father     ALLERGIES:  is allergic to levaquin [levofloxacin], 5ht3 receptor antagonists, diphenhydramine hcl, escitalopram, maxidex [dexamethasone], prednisone, serotonin, vytorin [ezetimibe-simvastatin], zocor [simvastatin], and diltiazem.  MEDICATIONS:  Current Outpatient Medications  Medication Sig Dispense Refill  . acetaminophen (TYLENOL) 325 MG tablet Take 2 tablets (650 mg total) by mouth every 8 (eight) hours as needed for mild pain. 40 tablet 0  . Ascorbic Acid (VITAMIN C PO) Take 1 tablet by mouth daily.    Marland Kitchen aspirin 81 MG EC tablet Take 1 tablet (81 mg total) by mouth daily. RESUME 48HRS after surgery 30 tablet 12  . atorvastatin (LIPITOR) 40 MG tablet Take 1 tablet (40 mg total) by mouth daily at 6 PM. (Patient taking differently: Take 40 mg by mouth daily with supper. ) 30 tablet 0  . digoxin (LANOXIN) 0.125 MG tablet Take 0.0625 mg by mouth daily.    Marland Kitchen docusate sodium (COLACE) 100 MG capsule Take 100 mg by mouth daily as needed for mild constipation.     . famotidine (PEPCID) 20 MG tablet Take 20 mg by mouth at bedtime.    . ferrous sulfate 325 (65 FE) MG tablet Take 1 tablet (325  mg total) by mouth every other day. (Patient taking differently: Take 325 mg by mouth every Monday, Wednesday, and Friday. With supper) 30 tablet 3  . furosemide (LASIX) 40 MG tablet Take 1 tablet (40 mg total) by mouth daily. (Patient taking differently: Take 20 mg by mouth daily. ) 30 tablet 3  . ibuprofen (ADVIL) 400 MG tablet Take 2 tablets (800 mg total) by mouth every 8 (eight) hours as needed for mild pain or moderate pain. 30 tablet 0  . metoprolol tartrate (LOPRESSOR) 25 MG tablet Take 12.5 mg by mouth 2 (two) times daily.    Marland Kitchen neomycin-bacitracin-polymyxin (NEOSPORIN) ointment Apply 1 application topically every other day. Wound care (foot area)    . nitroGLYCERIN (NITROSTAT) 0.4 MG SL tablet Place 0.4 mg under the tongue every 5  (five) minutes as needed for chest pain.     . pantoprazole (PROTONIX) 40 MG tablet Take 40 mg by mouth daily.    . polyethylene glycol (MIRALAX / GLYCOLAX) 17 g packet Take 17 g by mouth daily as needed for mild constipation. 14 each 0  . ranolazine (RANEXA) 1000 MG SR tablet Take 500 mg by mouth 2 (two) times daily.     . sucralfate (CARAFATE) 1 G tablet Take 1 g by mouth 2 (two) times daily.     . tamsulosin (FLOMAX) 0.4 MG CAPS capsule Take 0.4 mg by mouth daily after supper.     . vitamin B-12 (CYANOCOBALAMIN) 500 MCG tablet Take 500 mcg by mouth daily.    Marland Kitchen HYDROcodone-acetaminophen (NORCO) 5-325 MG tablet Take 1 tablet by mouth every 6 (six) hours as needed for up to 6 doses for moderate pain. (Patient not taking: Reported on 11/24/2019) 6 tablet 0   No current facility-administered medications for this visit.     PHYSICAL EXAMINATION: ECOG PERFORMANCE STATUS: 0 - Asymptomatic  Vitals:   11/25/19 1114  BP: 131/67  Pulse: 77  Temp: 97.8 F (36.6 C)   Filed Weights   11/25/19 1114  Weight: 188 lb (85.3 kg)    Physical Exam Constitutional:      Comments: Elderly Caucasian male patient. He accompanied by his son; patient in wheelchair.   HENT:     Head: Normocephalic and atraumatic.     Mouth/Throat:     Pharynx: No oropharyngeal exudate.  Eyes:     Pupils: Pupils are equal, round, and reactive to light.  Cardiovascular:     Rate and Rhythm: Normal rate and regular rhythm.  Pulmonary:     Effort: No respiratory distress.     Breath sounds: Normal breath sounds. No wheezing.  Abdominal:     General: Bowel sounds are normal. There is no distension.     Palpations: Abdomen is soft. There is no mass.     Tenderness: There is no abdominal tenderness. There is no guarding or rebound.  Musculoskeletal:        General: No tenderness. Normal range of motion.     Cervical back: Normal range of motion and neck supple.  Skin:    General: Skin is warm.  Neurological:      Mental Status: He is alert and oriented to person, place, and time.  Psychiatric:        Mood and Affect: Affect normal.    .   LABORATORY DATA:  I have reviewed the data as listed Lab Results  Component Value Date   WBC 10.8 (H) 11/25/2019   HGB 8.8 (L) 11/25/2019   HCT 27.2 (L)  11/25/2019   MCV 100.4 (H) 11/25/2019   PLT 226 11/25/2019   Recent Labs    01/07/19 0711 01/08/19 0428 07/29/19 0825 07/29/19 0825 07/29/19 2314 07/30/19 0224 07/31/19 0413 08/01/19 0444 10/14/19 8182 10/14/19 0948 10/21/19 0924 10/21/19 1231 11/06/19 1429 11/11/19 1104  NA 140   < > 140   < > 138   < > 135  136   < > 138  --  138  --   --  139  K 4.3   < > 4.2   < > 3.9   < > 3.9  3.8   < > 3.7  --  3.6  --   --  4.6  CL 109   < > 104   < > 106   < > 104  104   < > 104  --  102  --   --  104  CO2 21*   < > 23   < > 24   < > 19*  21*   < > 22  --  22  --   --  26  GLUCOSE 99   < > 122*   < > 108*   < > 101*  108*   < > 120*  --  133*  --   --  112*  BUN 24*   < > 30*   < > 27*   < > 32*  32*   < > 31*  --  35*  --   --  21  CREATININE 1.51*   < > 1.95*   < > 1.95*   < > 1.85*  1.87*   < > 1.67*   < > 1.97*  --  1.90* 1.51*  CALCIUM 8.8*   < > 8.9   < > 8.5*   < > 7.7*  7.8*   < > 9.0  --  9.3  --   --  9.0  GFRNONAA 42*   < > 30*   < > 30*   < > 32*  32*   < > 36*   < > 30*  --  31* 41*  GFRAA 48*   < > 35*   < > 35*   < > 37*  37*   < > 42*   < > 35*  --  36* 48*  PROT 7.8   < > 7.4  --  6.8  --   --   --   --   --   --  7.7  --   --   ALBUMIN 3.8   < > 4.0   < > 3.8  --  3.1*  --   --   --   --  4.4  --   --   AST 21   < > 20  --  36  --   --   --   --   --   --  23  --   --   ALT 15   < > 19  --  18  --   --   --   --   --   --  28  --   --   ALKPHOS 59   < > 58  --  55  --   --   --   --   --   --  56  --   --   BILITOT 0.9   < > 1.3*  --  1.9*  --   --   --   --   --   --  1.4*  --   --   BILIDIR 0.2  --   --   --   --   --   --   --   --   --   --  0.2  --   --   IBILI 0.7   --   --   --   --   --   --   --   --   --   --  1.2*  --   --    < > = values in this interval not displayed.   ASSESSMENT & PLAN:   Anemia of chronic kidney failure, stage 3 (moderate) ([place]) # Anemia-mild.  Since 2012.  Suspect MDS/chronic kidney disease.  No bone marrow biopsy; STABLE.   # Hemoglobin 8.8;proceed with today.  Proceed with Aranesp today.  Continue p.o. iron. April 2021- iron sat-22%.  Recommend IV Feraheme weekly x3.  # Mild intermittent thrombocytopenia-question ITP [nadir November 2020-80s].  Today platelets 117;  ok to with asprin.  Stable  #Cholecystitis acute s/p cholecystostomy tube; currently s/p definitive cholecystectomy on June 25th-improved no postoperative complications.,   #Recent acute CHF-2025% ejection fraction [[facility]]-see below ;on  Lasix 20 mg a day [half pill once a day]; stable.   #CKD stage III-stable  #Left upper lobe cystic lesion-incidentally found on imaging with [facility]; December 2020 CT scan-left upper lobe cystic lesion-STABLE.  Will order imaging today.  #Disposition: #  Aranesp today # IV ferrahem weekly x 3 [NOt as same days as Aranesp]  # in 2 weeks- cbc possible aranesp # # in 4 weeks- cbc possible aranesp # in 6 weeks MD- cbc/bmp/LDH/possible aranesp; CT chest priorDr.B      Cammie Sickle, MD 11/25/2019 10:20 PM

## 2019-11-25 NOTE — Assessment & Plan Note (Addendum)
#  Anemia-mild.  Since 2012.  Suspect MDS/chronic kidney disease.  No bone marrow biopsy; STABLE.   # Hemoglobin 8.8;proceed with today.  Proceed with Aranesp today.  Continue p.o. iron. April 2021- iron sat-22%.  Recommend IV Feraheme weekly x3.  # Mild intermittent thrombocytopenia-question ITP [nadir November 2020-80s].  Today platelets 117;  ok to with asprin.  Stable  #Cholecystitis acute s/p cholecystostomy tube; currently s/p definitive cholecystectomy on June 25th-improved no postoperative complications.,   #Recent acute CHF-2025% ejection fraction [[facility]]-see below ;on  Lasix 20 mg a day [half pill once a day]; stable.   #CKD stage III-stable  #Left upper lobe cystic lesion-incidentally found on imaging with [facility]; December 2020 CT scan-left upper lobe cystic lesion-STABLE.  Will order imaging today.  #Disposition: #  Aranesp today # IV ferrahem weekly x 3 [NOt as same days as Aranesp]  # in 2 weeks- cbc possible aranesp # # in 4 weeks- cbc possible aranesp # in 6 weeks MD- cbc/bmp/LDH/possible aranesp; CT chest priorDr.B

## 2019-11-30 DIAGNOSIS — D500 Iron deficiency anemia secondary to blood loss (chronic): Secondary | ICD-10-CM | POA: Diagnosis not present

## 2019-11-30 DIAGNOSIS — I5022 Chronic systolic (congestive) heart failure: Secondary | ICD-10-CM | POA: Diagnosis not present

## 2019-11-30 DIAGNOSIS — Z0000 Encounter for general adult medical examination without abnormal findings: Secondary | ICD-10-CM | POA: Diagnosis not present

## 2019-12-02 ENCOUNTER — Other Ambulatory Visit: Payer: Self-pay

## 2019-12-02 ENCOUNTER — Inpatient Hospital Stay: Payer: PPO

## 2019-12-02 VITALS — BP 129/71 | HR 78 | Temp 97.6°F

## 2019-12-02 DIAGNOSIS — I130 Hypertensive heart and chronic kidney disease with heart failure and stage 1 through stage 4 chronic kidney disease, or unspecified chronic kidney disease: Secondary | ICD-10-CM | POA: Diagnosis not present

## 2019-12-02 DIAGNOSIS — D500 Iron deficiency anemia secondary to blood loss (chronic): Secondary | ICD-10-CM

## 2019-12-02 DIAGNOSIS — D631 Anemia in chronic kidney disease: Secondary | ICD-10-CM

## 2019-12-02 MED ORDER — SODIUM CHLORIDE 0.9 % IV SOLN
510.0000 mg | Freq: Once | INTRAVENOUS | Status: AC
  Administered 2019-12-02: 510 mg via INTRAVENOUS
  Filled 2019-12-02: qty 510

## 2019-12-02 MED ORDER — SODIUM CHLORIDE 0.9 % IV SOLN
Freq: Once | INTRAVENOUS | Status: AC
  Filled 2019-12-02: qty 250

## 2019-12-07 DIAGNOSIS — L97522 Non-pressure chronic ulcer of other part of left foot with fat layer exposed: Secondary | ICD-10-CM | POA: Diagnosis not present

## 2019-12-09 ENCOUNTER — Inpatient Hospital Stay: Payer: PPO

## 2019-12-09 ENCOUNTER — Other Ambulatory Visit: Payer: Self-pay

## 2019-12-09 VITALS — BP 115/65 | HR 73 | Temp 97.0°F | Resp 20

## 2019-12-09 DIAGNOSIS — I130 Hypertensive heart and chronic kidney disease with heart failure and stage 1 through stage 4 chronic kidney disease, or unspecified chronic kidney disease: Secondary | ICD-10-CM | POA: Diagnosis not present

## 2019-12-09 DIAGNOSIS — D500 Iron deficiency anemia secondary to blood loss (chronic): Secondary | ICD-10-CM

## 2019-12-09 DIAGNOSIS — D631 Anemia in chronic kidney disease: Secondary | ICD-10-CM

## 2019-12-09 MED ORDER — SODIUM CHLORIDE 0.9 % IV SOLN
Freq: Once | INTRAVENOUS | Status: AC
  Filled 2019-12-09: qty 250

## 2019-12-09 MED ORDER — SODIUM CHLORIDE 0.9 % IV SOLN
510.0000 mg | Freq: Once | INTRAVENOUS | Status: AC
  Administered 2019-12-09: 510 mg via INTRAVENOUS
  Filled 2019-12-09: qty 510

## 2019-12-10 ENCOUNTER — Inpatient Hospital Stay: Payer: PPO

## 2019-12-10 ENCOUNTER — Other Ambulatory Visit: Payer: Self-pay

## 2019-12-10 VITALS — BP 104/51 | HR 51

## 2019-12-10 DIAGNOSIS — D500 Iron deficiency anemia secondary to blood loss (chronic): Secondary | ICD-10-CM

## 2019-12-10 DIAGNOSIS — N183 Chronic kidney disease, stage 3 unspecified: Secondary | ICD-10-CM

## 2019-12-10 DIAGNOSIS — I130 Hypertensive heart and chronic kidney disease with heart failure and stage 1 through stage 4 chronic kidney disease, or unspecified chronic kidney disease: Secondary | ICD-10-CM | POA: Diagnosis not present

## 2019-12-10 LAB — CBC WITH DIFFERENTIAL/PLATELET
Abs Immature Granulocytes: 0.12 10*3/uL — ABNORMAL HIGH (ref 0.00–0.07)
Basophils Absolute: 0 10*3/uL (ref 0.0–0.1)
Basophils Relative: 0 %
Eosinophils Absolute: 0.1 10*3/uL (ref 0.0–0.5)
Eosinophils Relative: 1 %
HCT: 28.6 % — ABNORMAL LOW (ref 39.0–52.0)
Hemoglobin: 9.1 g/dL — ABNORMAL LOW (ref 13.0–17.0)
Immature Granulocytes: 1 %
Lymphocytes Relative: 30 %
Lymphs Abs: 2.8 10*3/uL (ref 0.7–4.0)
MCH: 33.5 pg (ref 26.0–34.0)
MCHC: 31.8 g/dL (ref 30.0–36.0)
MCV: 105.1 fL — ABNORMAL HIGH (ref 80.0–100.0)
Monocytes Absolute: 2.8 10*3/uL — ABNORMAL HIGH (ref 0.1–1.0)
Monocytes Relative: 30 %
Neutro Abs: 3.5 10*3/uL (ref 1.7–7.7)
Neutrophils Relative %: 38 %
Platelets: 143 10*3/uL — ABNORMAL LOW (ref 150–400)
RBC: 2.72 MIL/uL — ABNORMAL LOW (ref 4.22–5.81)
RDW: 19.4 % — ABNORMAL HIGH (ref 11.5–15.5)
Smear Review: ADEQUATE
WBC: 9.4 10*3/uL (ref 4.0–10.5)
nRBC: 0 % (ref 0.0–0.2)

## 2019-12-10 MED ORDER — DARBEPOETIN ALFA 300 MCG/0.6ML IJ SOSY
300.0000 ug | PREFILLED_SYRINGE | Freq: Once | INTRAMUSCULAR | Status: AC
  Administered 2019-12-10: 300 ug via SUBCUTANEOUS
  Filled 2019-12-10: qty 0.6

## 2019-12-16 ENCOUNTER — Inpatient Hospital Stay: Payer: PPO | Attending: Internal Medicine

## 2019-12-16 ENCOUNTER — Other Ambulatory Visit: Payer: Self-pay

## 2019-12-16 VITALS — BP 133/58 | HR 62 | Temp 96.7°F | Resp 18

## 2019-12-16 DIAGNOSIS — I429 Cardiomyopathy, unspecified: Secondary | ICD-10-CM | POA: Diagnosis not present

## 2019-12-16 DIAGNOSIS — M7989 Other specified soft tissue disorders: Secondary | ICD-10-CM | POA: Diagnosis not present

## 2019-12-16 DIAGNOSIS — I2510 Atherosclerotic heart disease of native coronary artery without angina pectoris: Secondary | ICD-10-CM | POA: Insufficient documentation

## 2019-12-16 DIAGNOSIS — D696 Thrombocytopenia, unspecified: Secondary | ICD-10-CM | POA: Diagnosis not present

## 2019-12-16 DIAGNOSIS — I509 Heart failure, unspecified: Secondary | ICD-10-CM | POA: Diagnosis not present

## 2019-12-16 DIAGNOSIS — Z7982 Long term (current) use of aspirin: Secondary | ICD-10-CM | POA: Diagnosis not present

## 2019-12-16 DIAGNOSIS — D631 Anemia in chronic kidney disease: Secondary | ICD-10-CM | POA: Insufficient documentation

## 2019-12-16 DIAGNOSIS — D500 Iron deficiency anemia secondary to blood loss (chronic): Secondary | ICD-10-CM

## 2019-12-16 DIAGNOSIS — N189 Chronic kidney disease, unspecified: Secondary | ICD-10-CM | POA: Diagnosis not present

## 2019-12-16 DIAGNOSIS — R5383 Other fatigue: Secondary | ICD-10-CM | POA: Diagnosis not present

## 2019-12-16 DIAGNOSIS — F419 Anxiety disorder, unspecified: Secondary | ICD-10-CM | POA: Insufficient documentation

## 2019-12-16 DIAGNOSIS — I129 Hypertensive chronic kidney disease with stage 1 through stage 4 chronic kidney disease, or unspecified chronic kidney disease: Secondary | ICD-10-CM | POA: Insufficient documentation

## 2019-12-16 DIAGNOSIS — Z79899 Other long term (current) drug therapy: Secondary | ICD-10-CM | POA: Insufficient documentation

## 2019-12-16 DIAGNOSIS — Z87891 Personal history of nicotine dependence: Secondary | ICD-10-CM | POA: Insufficient documentation

## 2019-12-16 DIAGNOSIS — I252 Old myocardial infarction: Secondary | ICD-10-CM | POA: Insufficient documentation

## 2019-12-16 MED ORDER — SODIUM CHLORIDE 0.9 % IV SOLN
Freq: Once | INTRAVENOUS | Status: AC
  Filled 2019-12-16: qty 250

## 2019-12-16 MED ORDER — SODIUM CHLORIDE 0.9 % IV SOLN
510.0000 mg | Freq: Once | INTRAVENOUS | Status: AC
  Administered 2019-12-16: 510 mg via INTRAVENOUS
  Filled 2019-12-16: qty 510

## 2019-12-21 DIAGNOSIS — L97522 Non-pressure chronic ulcer of other part of left foot with fat layer exposed: Secondary | ICD-10-CM | POA: Diagnosis not present

## 2019-12-23 ENCOUNTER — Inpatient Hospital Stay: Payer: PPO

## 2019-12-23 ENCOUNTER — Other Ambulatory Visit: Payer: Self-pay

## 2019-12-23 DIAGNOSIS — D631 Anemia in chronic kidney disease: Secondary | ICD-10-CM

## 2019-12-23 DIAGNOSIS — I129 Hypertensive chronic kidney disease with stage 1 through stage 4 chronic kidney disease, or unspecified chronic kidney disease: Secondary | ICD-10-CM | POA: Diagnosis not present

## 2019-12-23 LAB — CBC WITH DIFFERENTIAL/PLATELET
Abs Immature Granulocytes: 0.07 10*3/uL (ref 0.00–0.07)
Basophils Absolute: 0 10*3/uL (ref 0.0–0.1)
Basophils Relative: 0 %
Eosinophils Absolute: 0.1 10*3/uL (ref 0.0–0.5)
Eosinophils Relative: 1 %
HCT: 34 % — ABNORMAL LOW (ref 39.0–52.0)
Hemoglobin: 10.8 g/dL — ABNORMAL LOW (ref 13.0–17.0)
Immature Granulocytes: 1 %
Lymphocytes Relative: 25 %
Lymphs Abs: 1.8 10*3/uL (ref 0.7–4.0)
MCH: 33.9 pg (ref 26.0–34.0)
MCHC: 31.8 g/dL (ref 30.0–36.0)
MCV: 106.6 fL — ABNORMAL HIGH (ref 80.0–100.0)
Monocytes Absolute: 2.3 10*3/uL — ABNORMAL HIGH (ref 0.1–1.0)
Monocytes Relative: 32 %
Neutro Abs: 2.9 10*3/uL (ref 1.7–7.7)
Neutrophils Relative %: 41 %
Platelets: 129 10*3/uL — ABNORMAL LOW (ref 150–400)
RBC: 3.19 MIL/uL — ABNORMAL LOW (ref 4.22–5.81)
RDW: 20.2 % — ABNORMAL HIGH (ref 11.5–15.5)
WBC: 7.2 10*3/uL (ref 4.0–10.5)
nRBC: 0 % (ref 0.0–0.2)

## 2019-12-28 DIAGNOSIS — N1832 Chronic kidney disease, stage 3b: Secondary | ICD-10-CM | POA: Diagnosis not present

## 2019-12-28 DIAGNOSIS — Z0000 Encounter for general adult medical examination without abnormal findings: Secondary | ICD-10-CM | POA: Diagnosis not present

## 2019-12-28 DIAGNOSIS — E538 Deficiency of other specified B group vitamins: Secondary | ICD-10-CM | POA: Diagnosis not present

## 2019-12-28 DIAGNOSIS — I5022 Chronic systolic (congestive) heart failure: Secondary | ICD-10-CM | POA: Diagnosis not present

## 2019-12-28 DIAGNOSIS — D500 Iron deficiency anemia secondary to blood loss (chronic): Secondary | ICD-10-CM | POA: Diagnosis not present

## 2019-12-28 DIAGNOSIS — I255 Ischemic cardiomyopathy: Secondary | ICD-10-CM | POA: Diagnosis not present

## 2019-12-29 DIAGNOSIS — I255 Ischemic cardiomyopathy: Secondary | ICD-10-CM | POA: Diagnosis not present

## 2019-12-29 DIAGNOSIS — I5022 Chronic systolic (congestive) heart failure: Secondary | ICD-10-CM | POA: Diagnosis not present

## 2019-12-29 DIAGNOSIS — I482 Chronic atrial fibrillation, unspecified: Secondary | ICD-10-CM | POA: Diagnosis not present

## 2019-12-29 DIAGNOSIS — I10 Essential (primary) hypertension: Secondary | ICD-10-CM | POA: Diagnosis not present

## 2019-12-29 DIAGNOSIS — I951 Orthostatic hypotension: Secondary | ICD-10-CM | POA: Diagnosis not present

## 2019-12-29 DIAGNOSIS — I25810 Atherosclerosis of coronary artery bypass graft(s) without angina pectoris: Secondary | ICD-10-CM | POA: Diagnosis not present

## 2020-01-06 ENCOUNTER — Ambulatory Visit
Admission: RE | Admit: 2020-01-06 | Discharge: 2020-01-06 | Disposition: A | Discharge status: Home / Self Care | Payer: PPO | Source: Ambulatory Visit | Attending: Internal Medicine | Admitting: Internal Medicine

## 2020-01-06 ENCOUNTER — Other Ambulatory Visit: Payer: Self-pay

## 2020-01-06 DIAGNOSIS — R918 Other nonspecific abnormal finding of lung field: Secondary | ICD-10-CM | POA: Insufficient documentation

## 2020-01-06 DIAGNOSIS — N183 Chronic kidney disease, stage 3 unspecified: Secondary | ICD-10-CM | POA: Diagnosis not present

## 2020-01-06 DIAGNOSIS — D631 Anemia in chronic kidney disease: Secondary | ICD-10-CM | POA: Insufficient documentation

## 2020-01-06 DIAGNOSIS — J90 Pleural effusion, not elsewhere classified: Secondary | ICD-10-CM | POA: Diagnosis not present

## 2020-01-06 DIAGNOSIS — I700 Atherosclerosis of aorta: Secondary | ICD-10-CM | POA: Insufficient documentation

## 2020-01-06 DIAGNOSIS — J984 Other disorders of lung: Secondary | ICD-10-CM | POA: Diagnosis not present

## 2020-01-06 DIAGNOSIS — J929 Pleural plaque without asbestos: Secondary | ICD-10-CM | POA: Diagnosis not present

## 2020-01-07 ENCOUNTER — Inpatient Hospital Stay: Payer: PPO

## 2020-01-07 ENCOUNTER — Encounter: Payer: Self-pay | Admitting: Internal Medicine

## 2020-01-07 ENCOUNTER — Inpatient Hospital Stay (HOSPITAL_BASED_OUTPATIENT_CLINIC_OR_DEPARTMENT_OTHER): Payer: PPO | Admitting: Internal Medicine

## 2020-01-07 DIAGNOSIS — D631 Anemia in chronic kidney disease: Secondary | ICD-10-CM | POA: Diagnosis not present

## 2020-01-07 DIAGNOSIS — N183 Chronic kidney disease, stage 3 unspecified: Secondary | ICD-10-CM

## 2020-01-07 DIAGNOSIS — D500 Iron deficiency anemia secondary to blood loss (chronic): Secondary | ICD-10-CM

## 2020-01-07 DIAGNOSIS — I129 Hypertensive chronic kidney disease with stage 1 through stage 4 chronic kidney disease, or unspecified chronic kidney disease: Secondary | ICD-10-CM | POA: Diagnosis not present

## 2020-01-07 LAB — BASIC METABOLIC PANEL
Anion gap: 10 (ref 5–15)
BUN: 23 mg/dL (ref 8–23)
CO2: 23 mmol/L (ref 22–32)
Calcium: 8.7 mg/dL — ABNORMAL LOW (ref 8.9–10.3)
Chloride: 104 mmol/L (ref 98–111)
Creatinine, Ser: 1.58 mg/dL — ABNORMAL HIGH (ref 0.61–1.24)
GFR calc Af Amer: 45 mL/min — ABNORMAL LOW (ref >60)
GFR calc non Af Amer: 39 mL/min — ABNORMAL LOW (ref >60)
Glucose, Bld: 119 mg/dL — ABNORMAL HIGH (ref 70–99)
Potassium: 3.8 mmol/L (ref 3.5–5.1)
Sodium: 137 mmol/L (ref 135–145)

## 2020-01-07 LAB — CBC WITH DIFFERENTIAL/PLATELET
Abs Immature Granulocytes: 0 10*3/uL (ref 0.00–0.07)
Basophils Absolute: 0 10*3/uL (ref 0.0–0.1)
Basophils Relative: 0 %
Eosinophils Absolute: 0 10*3/uL (ref 0.0–0.5)
Eosinophils Relative: 0 %
HCT: 29.5 % — ABNORMAL LOW (ref 39.0–52.0)
Hemoglobin: 9.7 g/dL — ABNORMAL LOW (ref 13.0–17.0)
Lymphocytes Relative: 36 %
Lymphs Abs: 2.9 10*3/uL (ref 0.7–4.0)
MCH: 34.6 pg — ABNORMAL HIGH (ref 26.0–34.0)
MCHC: 32.9 g/dL (ref 30.0–36.0)
MCV: 105.4 fL — ABNORMAL HIGH (ref 80.0–100.0)
Monocytes Absolute: 2.4 10*3/uL — ABNORMAL HIGH (ref 0.1–1.0)
Monocytes Relative: 30 %
Neutro Abs: 2.7 10*3/uL (ref 1.7–7.7)
Neutrophils Relative %: 34 %
Platelets: 139 10*3/uL — ABNORMAL LOW (ref 150–400)
RBC: 2.8 MIL/uL — ABNORMAL LOW (ref 4.22–5.81)
RDW: 17.6 % — ABNORMAL HIGH (ref 11.5–15.5)
WBC: 8 10*3/uL (ref 4.0–10.5)
nRBC: 0 % (ref 0.0–0.2)

## 2020-01-07 LAB — LACTATE DEHYDROGENASE: LDH: 143 U/L (ref 98–192)

## 2020-01-07 MED ORDER — DARBEPOETIN ALFA 300 MCG/0.6ML IJ SOSY
300.0000 ug | PREFILLED_SYRINGE | Freq: Once | INTRAMUSCULAR | Status: AC
  Administered 2020-01-07: 300 ug via SUBCUTANEOUS
  Filled 2020-01-07: qty 0.6

## 2020-01-07 NOTE — Assessment & Plan Note (Addendum)
#  Anemia-mild.  Since 2012.  Suspect MDS/chronic kidney disease.  No bone marrow biopsy; stable  # Hemoglobin 9.7 proceed with today.  Proceed with Aranesp today.  Continue p.o. iron. April 2021- iron sat-22%.  # Mild intermittent thrombocytopenia-question ITP [nadir November 2020-80s].  Today platelets 139; ok to with asprin.  Stable  #Recent acute CHF-2025% ejection fraction [[facility]]-see below ;on  Lasix 20 mg a day [half pill once a day]; stable.   #CKD stage III-stable  #Left upper lobe cystic lesion-[incidentally] CT scan- AUG 25th- 2021- CT scan-left upper lobe cystic lesion-4.5 cm-/slightly bigger by few mm- over stable; discussed with Dr. Aleskerov, pulmonary.  Continue surveillance.  # COVID BOOSTER: Discussed given patient's diagnosis and other comorbidities/therapies-patient would be considered immunocompromised.  As per CDC recommendation/FDA approval-I would recommend booster vaccine.  Patient/daughter in interested.    #Disposition: #  Aranesp today # in 2 weeks- cbc possible aranesp # # in 4 weeks- cbc possible aranesp # in 6 weeks MD- cbc/bmp/LDH/check iron studies/feritin; possible aranesp-Dr.B  # I reviewed the blood work- with the patient in detail; also reviewed the imaging independently [as summarized above]; and with the patient in detail.   

## 2020-01-07 NOTE — Progress Notes (Signed)
[place] NOTE  Patient Care Team: Rusty Aus, MD as PCP - General (Internal Medicine) Alisa Graff, FNP as Nurse Practitioner (Family Medicine) Ubaldo Glassing Javier Docker, MD as Consulting Physician (Cardiology) Erby Pian, MD as Referring Physician (Specialist) Cammie Sickle, MD as Consulting Physician (Hematology and Oncology)  CHIEF COMPLAINTS/PURPOSE OF CONSULTATION:   # 2012- Anemia 11-12- sec to CKD/IDA? MDS [no BMBx] [March 2017-sat-11%; ferritin-42] [Feb 2017- EGD/colo-July 2015 Dr.Skulskie];April 2017-PO Iron PO; May 2020-IV iron/Aranesp.  # Intermittent thrombocytopenia 120s-140s [Dec 2016-CT- neg cirrhosis/splenomegaly]  #June 2020 -LUL cystic lesion- 4x4cm ?  Question etiology [slightly increased from 2017]; question postinflammatory versus low-grade adenocarcinoma--August 2021 CT scan-overall stable.  # CKD [creatinine 1.2- 1.4]; CAD/CHF; March 2021-acalculous cholecystitis/sepsis [ICU]; s/p cholecystostomy tube placement  HISTORY OF PRESENTING ILLNESS:  Andres Gutierrez 86 y.o.  male history of above history of chronic anemia and intermittent thrombocytopenia is here for follow-up/ currently on IV iron and also Aranesp/and also left upper lobe cavitary lesion is here to review the results of the CT scan  Patient denies any worsening shortness of breath or cough.  Chronic mild fatigue.  Chronic mild swelling in the legs.  Chronic fatigue.  Not any worse.  Review of Systems  Constitutional: Positive for malaise/fatigue. Negative for chills, diaphoresis, fever and weight loss.  HENT: Negative for nosebleeds and sore throat.   Eyes: Negative for double vision.  Respiratory: Positive for shortness of breath. Negative for cough, hemoptysis, sputum production and wheezing.   Cardiovascular: Negative for chest pain, palpitations, orthopnea and leg swelling.  Gastrointestinal: Negative for abdominal pain, blood in stool, constipation, diarrhea,  heartburn, melena, nausea and vomiting.  Genitourinary: Negative for dysuria, frequency and urgency.  Musculoskeletal: Positive for back pain and joint pain.  Skin: Negative.  Negative for itching and rash.  Neurological: Negative for dizziness, tingling, focal weakness, weakness and headaches.  Endo/Heme/Allergies: Does not bruise/bleed easily.  Psychiatric/Behavioral: Negative for depression. The patient is not nervous/anxious and does not have insomnia.      MEDICAL HISTORY:  Past Medical History:  Diagnosis Date  . Anemia   . Anxiety   . Cardiomyopathy ([place])   . CHF (congestive heart failure) ([place])   . Chronic kidney disease    kidney stones  . Coronary artery disease   . Cough   . Dyspnea   . Hypertension   . Lymphadenopathy, hilar   . MI (myocardial infarction) ([place])    x 2  . Wheezing     SURGICAL HISTORY: Past Surgical History:  Procedure Laterality Date  . BRONCHIAL NEEDLE ASPIRATION BIOPSY N/A 12/31/2014   Procedure: BRONCHIAL NEEDLE ASPIRATION BIOPSIES from carina;  Surgeon: Flora Lipps, MD;  Location: [facility];  Service: Cardiopulmonary;  Laterality: N/A;  . CARDIAC CATHETERIZATION N/A 12/28/2015   Procedure: Left Heart Cath and Cors/Grafts Angiography;  Surgeon: Yolonda Kida, MD;  Location: [facility];  Service: Cardiovascular;  Laterality: N/A;  . CATARACT EXTRACTION W/PHACO Left 08/01/2016   Procedure: CATARACT EXTRACTION PHACO AND INTRAOCULAR LENS PLACEMENT ([place]) Left;  Surgeon: Leandrew Koyanagi, MD;  Location: [facility];  Service: Ophthalmology;  Laterality: Left;  . COLON SURGERY    . CORONARY ANGIOPLASTY WITH STENT PLACEMENT    . CORONARY ARTERY BYPASS GRAFT    . ENDOBRONCHIAL ULTRASOUND N/A 12/31/2014   Procedure: ENDOBRONCHIAL ULTRASOUND;  Surgeon: Flora Lipps, MD;  Location: [facility];  Service: Cardiopulmonary;  Laterality: N/A;  . ESOPHAGOGASTRODUODENOSCOPY (EGD) WITH PROPOFOL N/A 06/20/2015  Procedure:  ESOPHAGOGASTRODUODENOSCOPY (EGD) WITH PROPOFOL;  Surgeon: Lollie Sails, MD;  Location[facility];  Service: Endoscopy;  Laterality: N/A;  . IR CATHETER TUBE CHANGE  10/26/2019  . IR CHOLANGIOGRAM EXISTING TUBE  08/13/2019    SOCIAL HISTORY: lives in [place]; alone. 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  Social History   Socioeconomic History  . Marital status: Widowed    Spouse name: Not on file  . Number of children: 2  . Years of education: college  . Highest education level: Some college, no degree  Occupational History  . Occupation: retired  Tobacco Use  . Smoking status: Former Smoker    Quit date: 06/27/1977    Years since quitting: 42.5  . Smokeless tobacco: Current User    Types: Chew  Vaping Use  . Vaping Use: Never used  Substance and Sexual Activity  . Alcohol use: Yes    Comment: occational almost rare once a year  . Drug use: No  . Sexual activity: Never    Birth control/protection: Abstinence  Other Topics Concern  . Not on file  Social History Narrative  . Not on file   Social Determinants of Health   Financial Resource Strain:   . Difficulty of Paying Living Expenses: Not on file  Food Insecurity:   . Worried About Charity fundraiser in the Last Year: Not on file  . Ran Out of Food in the Last Year: Not on file  Transportation Needs:   . Lack of Transportation (Medical): Not on file  . Lack of Transportation (Non-Medical): Not on file  Physical Activity:   . Days of Exercise per Week: Not on file  . Minutes of Exercise per Session: Not on file  Stress:   . Feeling of Stress : Not on file  Social Connections:   . Frequency of Communication with Friends and Family: Not on file  . Frequency of Social Gatherings with Friends and Family: Not on file  . Attends Religious Services: Not on file  . Active Member of Clubs or Organizations: Not on file  . Attends Archivist Meetings: Not on file  . Marital Status: Not on file  Intimate Partner  Violence:   . Fear of Current or Ex-Partner: Not on file  . Emotionally Abused: Not on file  . Physically Abused: Not on file  . Sexually Abused: Not on file    FAMILY HISTORY: Family History  Problem Relation Age of Onset  . CAD Mother   . Colon cancer Father     ALLERGIES:  is allergic to levaquin [levofloxacin], 5ht3 receptor antagonists, diphenhydramine hcl, escitalopram, maxidex [dexamethasone], prednisone, serotonin, vytorin [ezetimibe-simvastatin], zocor [simvastatin], and diltiazem.  MEDICATIONS:  Current Outpatient Medications  Medication Sig Dispense Refill  . Ascorbic Acid (VITAMIN C PO) Take 1 tablet by mouth daily.    Marland Kitchen aspirin 81 MG EC tablet Take 1 tablet (81 mg total) by mouth daily. RESUME 48HRS after surgery 30 tablet 12  . atorvastatin (LIPITOR) 40 MG tablet Take 1 tablet (40 mg total) by mouth daily at 6 PM. (Patient taking differently: Take 40 mg by mouth daily with supper. ) 30 tablet 0  . digoxin (LANOXIN) 0.125 MG tablet Take 0.0625 mg by mouth daily.    Marland Kitchen docusate sodium (COLACE) 100 MG capsule Take 100 mg by mouth daily as needed for mild constipation.     . famotidine (PEPCID) 20 MG tablet Take 20 mg by mouth at bedtime.    . ferrous sulfate  325 (65 FE) MG tablet Take 1 tablet (325 mg total) by mouth every other day. (Patient taking differently: Take 325 mg by mouth every Monday, Wednesday, and Friday. With supper) 30 tablet 3  . furosemide (LASIX) 40 MG tablet Take 1 tablet (40 mg total) by mouth daily. (Patient taking differently: Take 20 mg by mouth daily. ) 30 tablet 3  . ibuprofen (ADVIL) 400 MG tablet Take 2 tablets (800 mg total) by mouth every 8 (eight) hours as needed for mild pain or moderate pain. 30 tablet 0  . metoprolol tartrate (LOPRESSOR) 25 MG tablet Take 12.5 mg by mouth 2 (two) times daily.    Marland Kitchen neomycin-bacitracin-polymyxin (NEOSPORIN) ointment Apply 1 application topically every other day. Wound care (foot area)    . nitroGLYCERIN  (NITROSTAT) 0.4 MG SL tablet Place 0.4 mg under the tongue every 5 (five) minutes as needed for chest pain.     . pantoprazole (PROTONIX) 40 MG tablet Take 40 mg by mouth daily.    . polyethylene glycol (MIRALAX / GLYCOLAX) 17 g packet Take 17 g by mouth daily as needed for mild constipation. 14 each 0  . ranolazine (RANEXA) 1000 MG SR tablet Take 500 mg by mouth 2 (two) times daily.     . sucralfate (CARAFATE) 1 G tablet Take 1 g by mouth 2 (two) times daily.     . tamsulosin (FLOMAX) 0.4 MG CAPS capsule Take 0.4 mg by mouth daily after supper.     . vitamin B-12 (CYANOCOBALAMIN) 500 MCG tablet Take 500 mcg by mouth daily.    Marland Kitchen HYDROcodone-acetaminophen (NORCO) 5-325 MG tablet Take 1 tablet by mouth every 6 (six) hours as needed for up to 6 doses for moderate pain. (Patient not taking: Reported on 11/24/2019) 6 tablet 0   No current facility-administered medications for this visit.     PHYSICAL EXAMINATION: ECOG PERFORMANCE STATUS: 0 - Asymptomatic  Vitals:   01/07/20 0930  BP: (!) 135/56  Pulse: 65  Resp: 18  Temp: (!) 97 F (36.1 C)  SpO2: 99%   Filed Weights   01/07/20 0930  Weight: 184 lb (83.5 kg)    Physical Exam Constitutional:      Comments: Elderly Caucasian male patient. He accompanied by his daughter.  Patient ambulating independently.   HENT:     Head: Normocephalic and atraumatic.     Mouth/Throat:     Pharynx: No oropharyngeal exudate.  Eyes:     Pupils: Pupils are equal, round, and reactive to light.  Cardiovascular:     Rate and Rhythm: Normal rate and regular rhythm.  Pulmonary:     Effort: No respiratory distress.     Breath sounds: Normal breath sounds. No wheezing.  Abdominal:     General: Bowel sounds are normal. There is no distension.     Palpations: Abdomen is soft. There is no mass.     Tenderness: There is no abdominal tenderness. There is no guarding or rebound.  Musculoskeletal:        General: No tenderness. Normal range of motion.      Cervical back: Normal range of motion and neck supple.  Skin:    General: Skin is warm.  Neurological:     Mental Status: He is alert and oriented to person, place, and time.  Psychiatric:        Mood and Affect: Affect normal.    .   LABORATORY DATA:  I have reviewed the data as listed Lab Results  Component Value  Date   WBC 8.0 01/07/2020   HGB 9.7 (L) 01/07/2020   HCT 29.5 (L) 01/07/2020   MCV 105.4 (H) 01/07/2020   PLT 139 (L) 01/07/2020   Recent Labs    07/29/19 0825 07/29/19 0825 07/29/19 2314 07/30/19 0224 07/31/19 0413 08/01/19 0444 10/21/19 2542 10/21/19 7062 10/21/19 1231 11/06/19 1429 11/11/19 1104 01/07/20 0916  NA 140   < > 138   < > 135  136   < > 138  --   --   --  139 137  K 4.2   < > 3.9   < > 3.9  3.8   < > 3.6  --   --   --  4.6 3.8  CL 104   < > 106   < > 104  104   < > 102  --   --   --  104 104  CO2 23   < > 24   < > 19*  21*   < > 22  --   --   --  26 23  GLUCOSE 122*   < > 108*   < > 101*  108*   < > 133*  --   --   --  112* 119*  BUN 30*   < > 27*   < > 32*  32*   < > 35*  --   --   --  21 23  CREATININE 1.95*   < > 1.95*   < > 1.85*  1.87*   < > 1.97*   < >  --  1.90* 1.51* 1.58*  CALCIUM 8.9   < > 8.5*   < > 7.7*  7.8*   < > 9.3  --   --   --  9.0 8.7*  GFRNONAA 30*   < > 30*   < > 32*  32*   < > 30*   < >  --  31* 41* 39*  GFRAA 35*   < > 35*   < > 37*  37*   < > 35*   < >  --  36* 48* 45*  PROT 7.4  --  6.8  --   --   --   --   --  7.7  --   --   --   ALBUMIN 4.0   < > 3.8  --  3.1*  --   --   --  4.4  --   --   --   AST 20  --  36  --   --   --   --   --  23  --   --   --   ALT 19  --  18  --   --   --   --   --  28  --   --   --   ALKPHOS 58  --  55  --   --   --   --   --  56  --   --   --   BILITOT 1.3*  --  1.9*  --   --   --   --   --  1.4*  --   --   --   BILIDIR  --   --   --   --   --   --   --   --  0.2  --   --   --   IBILI  --   --   --   --   --   --   --   --  1.2*  --   --   --    < > = values in this interval  not displayed.   ASSESSMENT & PLAN:   Anemia of chronic kidney failure, stage 3 (moderate) ([place]) # Anemia-mild.  Since 2012.  Suspect MDS/chronic kidney disease.  No bone marrow biopsy; stable  # Hemoglobin 9.7 proceed with today.  Proceed with Aranesp today.  Continue p.o. iron. April 2021- iron sat-22%.  # Mild intermittent thrombocytopenia-question ITP [nadir November 2020-80s].  Today platelets 139; ok to with asprin.  Stable  #Recent acute CHF-2025% ejection fraction [[facility]]-see below ;on  Lasix 20 mg a day [half pill once a day]; stable.   #CKD stage III-stable  #Left upper lobe cystic lesion-[incidentally] CT scan- AUG 25th- 2021- CT scan-left upper lobe cystic lesion-4.5 cm-/slightly bigger by few mm- over stable; discussed with Dr. Lanney Gins, pulmonary.  Continue surveillance.  # COVID BOOSTER: Discussed given patient's diagnosis and other comorbidities/therapies-patient would be considered immunocompromised.  As per CDC recommendation/FDA approval-I would recommend booster vaccine.  Patient/daughter in interested.    #Disposition: #  Aranesp today # in 2 weeks- cbc possible aranesp # # in 4 weeks- cbc possible aranesp # in 6 weeks MD- cbc/bmp/LDH/check iron studies/feritin; possible aranesp-Dr.B  # I reviewed the blood work- with the patient in detail; also reviewed the imaging independently [as summarized above]; and with the patient in detail.        Cammie Sickle, MD 01/08/2020 4:38 PM

## 2020-01-08 ENCOUNTER — Telehealth: Payer: Self-pay | Admitting: Internal Medicine

## 2020-01-08 NOTE — Telephone Encounter (Signed)
Spoke with daughter regarding my discussion with Dr.Aleskerov-CT scan chest stable.  No new recommendations follow-up as planned

## 2020-01-11 DIAGNOSIS — G609 Hereditary and idiopathic neuropathy, unspecified: Secondary | ICD-10-CM | POA: Diagnosis not present

## 2020-01-11 DIAGNOSIS — L97522 Non-pressure chronic ulcer of other part of left foot with fat layer exposed: Secondary | ICD-10-CM | POA: Diagnosis not present

## 2020-01-21 ENCOUNTER — Inpatient Hospital Stay: Payer: PPO

## 2020-01-21 ENCOUNTER — Inpatient Hospital Stay: Payer: PPO | Attending: Internal Medicine

## 2020-01-21 ENCOUNTER — Other Ambulatory Visit: Payer: Self-pay

## 2020-01-21 DIAGNOSIS — I129 Hypertensive chronic kidney disease with stage 1 through stage 4 chronic kidney disease, or unspecified chronic kidney disease: Secondary | ICD-10-CM | POA: Insufficient documentation

## 2020-01-21 DIAGNOSIS — D631 Anemia in chronic kidney disease: Secondary | ICD-10-CM | POA: Diagnosis not present

## 2020-01-21 DIAGNOSIS — N183 Chronic kidney disease, stage 3 unspecified: Secondary | ICD-10-CM | POA: Insufficient documentation

## 2020-01-21 LAB — CBC WITH DIFFERENTIAL/PLATELET
Abs Immature Granulocytes: 0.13 10*3/uL — ABNORMAL HIGH (ref 0.00–0.07)
Basophils Absolute: 0 10*3/uL (ref 0.0–0.1)
Basophils Relative: 0 %
Eosinophils Absolute: 0.1 10*3/uL (ref 0.0–0.5)
Eosinophils Relative: 1 %
HCT: 32.5 % — ABNORMAL LOW (ref 39.0–52.0)
Hemoglobin: 10.9 g/dL — ABNORMAL LOW (ref 13.0–17.0)
Immature Granulocytes: 1 %
Lymphocytes Relative: 24 %
Lymphs Abs: 2.3 10*3/uL (ref 0.7–4.0)
MCH: 35.2 pg — ABNORMAL HIGH (ref 26.0–34.0)
MCHC: 33.5 g/dL (ref 30.0–36.0)
MCV: 104.8 fL — ABNORMAL HIGH (ref 80.0–100.0)
Monocytes Absolute: 3.3 10*3/uL — ABNORMAL HIGH (ref 0.1–1.0)
Monocytes Relative: 35 %
Neutro Abs: 3.6 10*3/uL (ref 1.7–7.7)
Neutrophils Relative %: 39 %
Platelets: 156 10*3/uL (ref 150–400)
RBC: 3.1 MIL/uL — ABNORMAL LOW (ref 4.22–5.81)
RDW: 17.9 % — ABNORMAL HIGH (ref 11.5–15.5)
WBC: 9.4 10*3/uL (ref 4.0–10.5)
nRBC: 0 % (ref 0.0–0.2)

## 2020-02-01 DIAGNOSIS — G609 Hereditary and idiopathic neuropathy, unspecified: Secondary | ICD-10-CM | POA: Diagnosis not present

## 2020-02-01 DIAGNOSIS — L97511 Non-pressure chronic ulcer of other part of right foot limited to breakdown of skin: Secondary | ICD-10-CM | POA: Diagnosis not present

## 2020-02-01 DIAGNOSIS — L97522 Non-pressure chronic ulcer of other part of left foot with fat layer exposed: Secondary | ICD-10-CM | POA: Diagnosis not present

## 2020-02-04 ENCOUNTER — Inpatient Hospital Stay: Payer: PPO

## 2020-02-04 ENCOUNTER — Other Ambulatory Visit: Payer: Self-pay

## 2020-02-04 DIAGNOSIS — D631 Anemia in chronic kidney disease: Secondary | ICD-10-CM

## 2020-02-04 DIAGNOSIS — I129 Hypertensive chronic kidney disease with stage 1 through stage 4 chronic kidney disease, or unspecified chronic kidney disease: Secondary | ICD-10-CM | POA: Diagnosis not present

## 2020-02-04 LAB — CBC WITH DIFFERENTIAL/PLATELET
Abs Immature Granulocytes: 0.13 10*3/uL — ABNORMAL HIGH (ref 0.00–0.07)
Basophils Absolute: 0.1 10*3/uL (ref 0.0–0.1)
Basophils Relative: 1 %
Eosinophils Absolute: 0.1 10*3/uL (ref 0.0–0.5)
Eosinophils Relative: 1 %
HCT: 30.5 % — ABNORMAL LOW (ref 39.0–52.0)
Hemoglobin: 10.3 g/dL — ABNORMAL LOW (ref 13.0–17.0)
Immature Granulocytes: 1 %
Lymphocytes Relative: 25 %
Lymphs Abs: 2.7 10*3/uL (ref 0.7–4.0)
MCH: 35.2 pg — ABNORMAL HIGH (ref 26.0–34.0)
MCHC: 33.8 g/dL (ref 30.0–36.0)
MCV: 104.1 fL — ABNORMAL HIGH (ref 80.0–100.0)
Monocytes Absolute: 2.8 10*3/uL — ABNORMAL HIGH (ref 0.1–1.0)
Monocytes Relative: 26 %
Neutro Abs: 4.9 10*3/uL (ref 1.7–7.7)
Neutrophils Relative %: 46 %
Platelets: 110 10*3/uL — ABNORMAL LOW (ref 150–400)
RBC: 2.93 MIL/uL — ABNORMAL LOW (ref 4.22–5.81)
RDW: 16.9 % — ABNORMAL HIGH (ref 11.5–15.5)
Smear Review: NORMAL
WBC: 10.7 10*3/uL — ABNORMAL HIGH (ref 4.0–10.5)
nRBC: 0 % (ref 0.0–0.2)

## 2020-02-17 ENCOUNTER — Other Ambulatory Visit: Payer: Self-pay

## 2020-02-18 ENCOUNTER — Inpatient Hospital Stay (HOSPITAL_BASED_OUTPATIENT_CLINIC_OR_DEPARTMENT_OTHER): Payer: PPO | Admitting: Internal Medicine

## 2020-02-18 ENCOUNTER — Encounter: Payer: Self-pay | Admitting: Internal Medicine

## 2020-02-18 ENCOUNTER — Other Ambulatory Visit: Payer: Self-pay

## 2020-02-18 ENCOUNTER — Inpatient Hospital Stay: Payer: PPO

## 2020-02-18 ENCOUNTER — Inpatient Hospital Stay: Payer: PPO | Attending: Internal Medicine

## 2020-02-18 VITALS — BP 118/61 | HR 73 | Temp 97.5°F | Resp 18 | Ht 76.0 in | Wt 184.0 lb

## 2020-02-18 DIAGNOSIS — N183 Chronic kidney disease, stage 3 unspecified: Secondary | ICD-10-CM | POA: Diagnosis not present

## 2020-02-18 DIAGNOSIS — R0602 Shortness of breath: Secondary | ICD-10-CM | POA: Diagnosis not present

## 2020-02-18 DIAGNOSIS — Z87442 Personal history of urinary calculi: Secondary | ICD-10-CM | POA: Diagnosis not present

## 2020-02-18 DIAGNOSIS — Z79899 Other long term (current) drug therapy: Secondary | ICD-10-CM | POA: Diagnosis not present

## 2020-02-18 DIAGNOSIS — I509 Heart failure, unspecified: Secondary | ICD-10-CM | POA: Diagnosis not present

## 2020-02-18 DIAGNOSIS — F419 Anxiety disorder, unspecified: Secondary | ICD-10-CM | POA: Diagnosis not present

## 2020-02-18 DIAGNOSIS — Z7982 Long term (current) use of aspirin: Secondary | ICD-10-CM | POA: Diagnosis not present

## 2020-02-18 DIAGNOSIS — D631 Anemia in chronic kidney disease: Secondary | ICD-10-CM | POA: Diagnosis not present

## 2020-02-18 DIAGNOSIS — I129 Hypertensive chronic kidney disease with stage 1 through stage 4 chronic kidney disease, or unspecified chronic kidney disease: Secondary | ICD-10-CM | POA: Insufficient documentation

## 2020-02-18 DIAGNOSIS — Z87891 Personal history of nicotine dependence: Secondary | ICD-10-CM | POA: Diagnosis not present

## 2020-02-18 DIAGNOSIS — I2510 Atherosclerotic heart disease of native coronary artery without angina pectoris: Secondary | ICD-10-CM | POA: Insufficient documentation

## 2020-02-18 DIAGNOSIS — I252 Old myocardial infarction: Secondary | ICD-10-CM | POA: Diagnosis not present

## 2020-02-18 LAB — BASIC METABOLIC PANEL
Anion gap: 11 (ref 5–15)
BUN: 26 mg/dL — ABNORMAL HIGH (ref 8–23)
CO2: 25 mmol/L (ref 22–32)
Calcium: 9.2 mg/dL (ref 8.9–10.3)
Chloride: 101 mmol/L (ref 98–111)
Creatinine, Ser: 1.81 mg/dL — ABNORMAL HIGH (ref 0.61–1.24)
GFR calc non Af Amer: 33 mL/min — ABNORMAL LOW (ref >60)
Glucose, Bld: 123 mg/dL — ABNORMAL HIGH (ref 70–99)
Potassium: 4.2 mmol/L (ref 3.5–5.1)
Sodium: 137 mmol/L (ref 135–145)

## 2020-02-18 LAB — CBC WITH DIFFERENTIAL/PLATELET
Abs Immature Granulocytes: 0.13 10*3/uL — ABNORMAL HIGH (ref 0.00–0.07)
Basophils Absolute: 0 10*3/uL (ref 0.0–0.1)
Basophils Relative: 0 %
Eosinophils Absolute: 0.1 10*3/uL (ref 0.0–0.5)
Eosinophils Relative: 1 %
HCT: 31.3 % — ABNORMAL LOW (ref 39.0–52.0)
Hemoglobin: 10.6 g/dL — ABNORMAL LOW (ref 13.0–17.0)
Immature Granulocytes: 2 %
Lymphocytes Relative: 28 %
Lymphs Abs: 2.4 10*3/uL (ref 0.7–4.0)
MCH: 35.1 pg — ABNORMAL HIGH (ref 26.0–34.0)
MCHC: 33.9 g/dL (ref 30.0–36.0)
MCV: 103.6 fL — ABNORMAL HIGH (ref 80.0–100.0)
Monocytes Absolute: 2.5 10*3/uL — ABNORMAL HIGH (ref 0.1–1.0)
Monocytes Relative: 29 %
Neutro Abs: 3.5 10*3/uL (ref 1.7–7.7)
Neutrophils Relative %: 40 %
Platelets: 161 10*3/uL (ref 150–400)
RBC: 3.02 MIL/uL — ABNORMAL LOW (ref 4.22–5.81)
RDW: 15.8 % — ABNORMAL HIGH (ref 11.5–15.5)
WBC: 8.7 10*3/uL (ref 4.0–10.5)
nRBC: 0 % (ref 0.0–0.2)

## 2020-02-18 LAB — FERRITIN: Ferritin: 516 ng/mL — ABNORMAL HIGH (ref 24–336)

## 2020-02-18 LAB — IRON AND TIBC
Iron: 109 ug/dL (ref 45–182)
Saturation Ratios: 39 % (ref 17.9–39.5)
TIBC: 280 ug/dL (ref 250–450)
UIBC: 171 ug/dL

## 2020-02-18 LAB — LACTATE DEHYDROGENASE: LDH: 152 U/L (ref 98–192)

## 2020-02-18 NOTE — Assessment & Plan Note (Signed)
#  Anemia-mild.  Since 2012.  Suspect MDS/chronic kidney disease.  No bone marrow biopsy; stable  # Hemoglobin 10.3- proceed with today. HOLD  Aranesp today.  Continue p.o. iron. April 2021- iron sat-22%.  # Mild intermittent thrombocytopenia-question ITP [nadir November 2020-80s].  Today platelets 161; ok to with asprin. STABLE.   #Recent acute CHF-2025% ejection fraction [[facility]]-see below ;on  Lasix 20 mg a day [half pill once a day]; STABLE.   #CKD stage III-GFR-33; STABLE.   #Left upper lobe cystic lesion-[incidentally] CT scan- AUG 25th- 2021- CT scan-left upper lobe cystic lesion-4.5 cm-/slightly bigger by few mm- STABLE.   # COVID BOOSTER: Discussed given patient's diagnosis and other comorbidities/therapies-patient would be considered immunocompromised.  As per CDC recommendation/FDA approval-I would recommend booster vaccine.  Patient/daughter in interested.   #Disposition: #  HOLD Aranesp today # in 3 weeks- cbc possible aranesp # 6 weeks- cbc possible aranesp # 9 weeks- cbc possible aranesp # in 12 weeks MD- cbc/bmp/LDH/check iron studies/feritin; possible aranesp-Dr.B

## 2020-02-18 NOTE — Progress Notes (Signed)
Andres Gutierrez NOTE  Patient Care Team: Rusty Aus, MD as PCP - General (Internal Medicine) Alisa Graff, FNP as Nurse Practitioner (Family Medicine) Ubaldo Glassing Javier Docker, MD as Consulting Physician (Cardiology) Erby Pian, MD as Referring Physician (Specialist) Cammie Sickle, MD as Consulting Physician (Hematology and Oncology)  CHIEF COMPLAINTS/PURPOSE OF CONSULTATION:   # 2012- Anemia 11-12- sec to CKD/IDA? MDS [no BMBx] [March 2017-sat-11%; ferritin-42] [Feb 2017- EGD/colo-July 2015 Dr.Skulskie];April 2017-PO Iron PO; May 2020-IV iron/Aranesp.  # Intermittent thrombocytopenia 120s-140s [Dec 2016-CT- neg cirrhosis/splenomegaly]  #June 2020 -LUL cystic lesion- 4x4cm ?  Question etiology [slightly increased from 2017]; question postinflammatory versus low-grade adenocarcinoma--August 2021 CT scan-overall stable.  # CKD [creatinine 1.2- 1.4]; CAD/CHF; March 2021-acalculous cholecystitis/sepsis [ICU]; s/p cholecystostomy tube placement  HISTORY OF PRESENTING ILLNESS:  Andres Gutierrez 86 y.o.  male history of above history of chronic anemia and intermittent thrombocytopenia is here for follow-up/ currently on IV iron and also Aranesp here for follow-up.  Patient continues to have intermittent shortness of breath especially exertion.  Chronic mild fatigue.  Chronic mild swelling of the legs not any worse.  Patient has not any hospitalizations.  Has not needed blood transfusions.   Review of Systems  Constitutional: Positive for malaise/fatigue. Negative for chills, diaphoresis, fever and weight loss.  HENT: Negative for nosebleeds and sore throat.   Eyes: Negative for double vision.  Respiratory: Positive for shortness of breath. Negative for cough, hemoptysis, sputum production and wheezing.   Cardiovascular: Negative for chest pain, palpitations, orthopnea and leg swelling.  Gastrointestinal: Negative for abdominal pain, blood in stool,  constipation, diarrhea, heartburn, melena, nausea and vomiting.  Genitourinary: Negative for dysuria, frequency and urgency.  Musculoskeletal: Positive for back pain and joint pain.  Skin: Negative.  Negative for itching and rash.  Neurological: Negative for dizziness, tingling, focal weakness, weakness and headaches.  Endo/Heme/Allergies: Does not bruise/bleed easily.  Psychiatric/Behavioral: Negative for depression. The patient is not nervous/anxious and does not have insomnia.      MEDICAL HISTORY:  Past Medical History:  Diagnosis Date  . Anemia   . Anxiety   . Cardiomyopathy ([place])   . CHF (congestive heart failure) ([place])   . Chronic kidney disease    kidney stones  . Coronary artery disease   . Cough   . Dyspnea   . Hypertension   . Lymphadenopathy, hilar   . MI (myocardial infarction) ([place])    x 2  . Wheezing     SURGICAL HISTORY: Past Surgical History:  Procedure Laterality Date  . BRONCHIAL NEEDLE ASPIRATION BIOPSY N/A 12/31/2014   Procedure: BRONCHIAL NEEDLE ASPIRATION BIOPSIES from carina;  Surgeon: Flora Lipps, MD;  Location: [facility];  Service: Cardiopulmonary;  Laterality: N/A;  . CARDIAC CATHETERIZATION N/A 12/28/2015   Procedure: Left Heart Cath and Cors/Grafts Angiography;  Surgeon: Yolonda Kida, MD;  Location: [facility];  Service: Cardiovascular;  Laterality: N/A;  . CATARACT EXTRACTION W/PHACO Left 08/01/2016   Procedure: CATARACT EXTRACTION PHACO AND INTRAOCULAR LENS PLACEMENT ([place]) Left;  Surgeon: Leandrew Koyanagi, MD;  Location: [facility];  Service: Ophthalmology;  Laterality: Left;  . COLON SURGERY    . CORONARY ANGIOPLASTY WITH STENT PLACEMENT    . CORONARY ARTERY BYPASS GRAFT    . ENDOBRONCHIAL ULTRASOUND N/A 12/31/2014   Procedure: ENDOBRONCHIAL ULTRASOUND;  Surgeon: Flora Lipps, MD;  Location: [facility];  Service: Cardiopulmonary;  Laterality: N/A;  . ESOPHAGOGASTRODUODENOSCOPY (EGD) WITH PROPOFOL N/A 06/20/2015   Procedure:  ESOPHAGOGASTRODUODENOSCOPY (  EGD) WITH PROPOFOL;  Surgeon: Lollie Sails, MD;  Location[facility];  Service: Endoscopy;  Laterality: N/A;  . IR CATHETER TUBE CHANGE  10/26/2019  . IR CHOLANGIOGRAM EXISTING TUBE  08/13/2019    SOCIAL HISTORY: lives in [place]; alone. 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  Social History   Socioeconomic History  . Marital status: Widowed    Spouse name: Not on file  . Number of children: 2  . Years of education: college  . Highest education level: Some college, no degree  Occupational History  . Occupation: retired  Tobacco Use  . Smoking status: Former Smoker    Quit date: 06/27/1977    Years since quitting: 42.6  . Smokeless tobacco: Current User    Types: Chew  Vaping Use  . Vaping Use: Never used  Substance and Sexual Activity  . Alcohol use: Yes    Comment: occational almost rare once a year  . Drug use: No  . Sexual activity: Never    Birth control/protection: Abstinence  Other Topics Concern  . Not on file  Social History Narrative  . Not on file   Social Determinants of Health   Financial Resource Strain:   . Difficulty of Paying Living Expenses: Not on file  Food Insecurity:   . Worried About Charity fundraiser in the Last Year: Not on file  . Ran Out of Food in the Last Year: Not on file  Transportation Needs:   . Lack of Transportation (Medical): Not on file  . Lack of Transportation (Non-Medical): Not on file  Physical Activity:   . Days of Exercise per Week: Not on file  . Minutes of Exercise per Session: Not on file  Stress:   . Feeling of Stress : Not on file  Social Connections:   . Frequency of Communication with Friends and Family: Not on file  . Frequency of Social Gatherings with Friends and Family: Not on file  . Attends Religious Services: Not on file  . Active Member of Clubs or Organizations: Not on file  . Attends Archivist Meetings: Not on file  . Marital Status: Not on file  Intimate Partner  Violence:   . Fear of Current or Ex-Partner: Not on file  . Emotionally Abused: Not on file  . Physically Abused: Not on file  . Sexually Abused: Not on file    FAMILY HISTORY: Family History  Problem Relation Age of Onset  . CAD Mother   . Colon cancer Father     ALLERGIES:  is allergic to levaquin [levofloxacin], 5ht3 receptor antagonists, diphenhydramine hcl, escitalopram, maxidex [dexamethasone], prednisone, serotonin, vytorin [ezetimibe-simvastatin], zocor [simvastatin], and diltiazem.  MEDICATIONS:  Current Outpatient Medications  Medication Sig Dispense Refill  . Ascorbic Acid (VITAMIN C PO) Take 1 tablet by mouth daily.    Marland Kitchen aspirin 81 MG EC tablet Take 1 tablet (81 mg total) by mouth daily. RESUME 48HRS after surgery 30 tablet 12  . atorvastatin (LIPITOR) 40 MG tablet Take 1 tablet (40 mg total) by mouth daily at 6 PM. (Patient taking differently: Take 40 mg by mouth daily with supper. ) 30 tablet 0  . digoxin (LANOXIN) 0.125 MG tablet Take 0.0625 mg by mouth daily.    Marland Kitchen docusate sodium (COLACE) 100 MG capsule Take 100 mg by mouth daily as needed for mild constipation.     . famotidine (PEPCID) 20 MG tablet Take 20 mg by mouth at bedtime.    . ferrous sulfate 325 (65 FE)  MG tablet Take 1 tablet (325 mg total) by mouth every other day. (Patient taking differently: Take 325 mg by mouth every Monday, Wednesday, and Friday. With supper) 30 tablet 3  . furosemide (LASIX) 40 MG tablet Take 1 tablet (40 mg total) by mouth daily. (Patient taking differently: Take 20 mg by mouth daily. ) 30 tablet 3  . ibuprofen (ADVIL) 400 MG tablet Take 2 tablets (800 mg total) by mouth every 8 (eight) hours as needed for mild pain or moderate pain. 30 tablet 0  . metoprolol tartrate (LOPRESSOR) 25 MG tablet Take 12.5 mg by mouth 2 (two) times daily.    Marland Kitchen neomycin-bacitracin-polymyxin (NEOSPORIN) ointment Apply 1 application topically every other day. Wound care (foot area)    . nitroGLYCERIN  (NITROSTAT) 0.4 MG SL tablet Place 0.4 mg under the tongue every 5 (five) minutes as needed for chest pain.     . pantoprazole (PROTONIX) 40 MG tablet Take 40 mg by mouth daily.    . polyethylene glycol (MIRALAX / GLYCOLAX) 17 g packet Take 17 g by mouth daily as needed for mild constipation. 14 each 0  . ranolazine (RANEXA) 1000 MG SR tablet Take 500 mg by mouth 2 (two) times daily.     . sucralfate (CARAFATE) 1 G tablet Take 1 g by mouth 2 (two) times daily.     . tamsulosin (FLOMAX) 0.4 MG CAPS capsule Take 0.4 mg by mouth daily after supper.     . vitamin B-12 (CYANOCOBALAMIN) 500 MCG tablet Take 500 mcg by mouth daily.    Marland Kitchen HYDROcodone-acetaminophen (NORCO) 5-325 MG tablet Take 1 tablet by mouth every 6 (six) hours as needed for up to 6 doses for moderate pain. (Patient not taking: Reported on 11/24/2019) 6 tablet 0   No current facility-administered medications for this visit.     PHYSICAL EXAMINATION: ECOG PERFORMANCE STATUS: 0 - Asymptomatic  Vitals:   02/18/20 1056  BP: 118/61  Pulse: 73  Resp: 18  Temp: (!) 97.5 F (36.4 C)  SpO2: 97%   Filed Weights   02/18/20 1056  Weight: 184 lb (83.5 kg)    Physical Exam Constitutional:      Comments: Elderly Caucasian male patient. He accompanied by his daughter.  Patient ambulating independently.   HENT:     Head: Normocephalic and atraumatic.     Mouth/Throat:     Pharynx: No oropharyngeal exudate.  Eyes:     Pupils: Pupils are equal, round, and reactive to light.  Cardiovascular:     Rate and Rhythm: Normal rate and regular rhythm.  Pulmonary:     Effort: No respiratory distress.     Breath sounds: Normal breath sounds. No wheezing.  Abdominal:     General: Bowel sounds are normal. There is no distension.     Palpations: Abdomen is soft. There is no mass.     Tenderness: There is no abdominal tenderness. There is no guarding or rebound.  Musculoskeletal:        General: No tenderness. Normal range of motion.      Cervical back: Normal range of motion and neck supple.  Skin:    General: Skin is warm.  Neurological:     Mental Status: He is alert and oriented to person, place, and time.  Psychiatric:        Mood and Affect: Affect normal.    .   LABORATORY DATA:  I have reviewed the data as listed Lab Results  Component Value Date   WBC  8.7 02/18/2020   HGB 10.6 (L) 02/18/2020   HCT 31.3 (L) 02/18/2020   MCV 103.6 (H) 02/18/2020   PLT 161 02/18/2020   Recent Labs    07/29/19 0825 07/29/19 0825 07/29/19 2314 07/30/19 0224 07/31/19 0413 08/01/19 0444 10/21/19 0924 10/21/19 1231 11/06/19 1429 11/11/19 1104 01/07/20 0916 02/18/20 1036  NA 140   < > 138   < > 135  136   < >   < >  --   --  139 137 137  K 4.2   < > 3.9   < > 3.9  3.8   < >   < >  --   --  4.6 3.8 4.2  CL 104   < > 106   < > 104  104   < >   < >  --   --  104 104 101  CO2 23   < > 24   < > 19*  21*   < >   < >  --   --  _0 GLUCOSE 122*   < > 108*   < > 101*  108*   < >   < >  --   --  112* 119* 123*  BUN 30*   < > 27*   < > 32*  32*   < >   < >  --   --  21 23 26*  CREATININE 1.95*   < > 1.95*   < > 1.85*  1.87*   < >   < >  --  1.90* 1.51* 1.58* 1.81*  CALCIUM 8.9   < > 8.5*   < > 7.7*  7.8*   < >   < >  --   --  9.0 8.7* 9.2  GFRNONAA 30*   < > 30*   < > 32*  32*   < >   < >  --  31* 41* 39* 33*  GFRAA 35*   < > 35*   < > 37*  37*   < >   < >  --  36* 48* 45*  --   PROT 7.4  --  6.8  --   --   --   --  7.7  --   --   --   --   ALBUMIN 4.0   < > 3.8  --  3.1*  --   --  4.4  --   --   --   --   AST 20  --  36  --   --   --   --  23  --   --   --   --   ALT 19  --  18  --   --   --   --  28  --   --   --   --   ALKPHOS 58  --  55  --   --   --   --  56  --   --   --   --   BILITOT 1.3*  --  1.9*  --   --   --   --  1.4*  --   --   --   --   BILIDIR  --   --   --   --   --   --   --  0.2  --   --   --   --  IBILI  --   --   --   --   --   --   --  1.2*  --   --   --   --    < > = values in this  interval not displayed.   ASSESSMENT & PLAN:   Anemia of chronic kidney failure, stage 3 (moderate) ([place]) # Anemia-mild.  Since 2012.  Suspect MDS/chronic kidney disease.  No bone marrow biopsy; stable  # Hemoglobin 10.3- proceed with today. HOLD  Aranesp today.  Continue p.o. iron. April 2021- iron sat-22%.  # Mild intermittent thrombocytopenia-question ITP [nadir November 2020-80s].  Today platelets 161; ok to with asprin. STABLE.   #Recent acute CHF-2025% ejection fraction [[facility]]-see below ;on  Lasix 20 mg a day [half pill once a day]; STABLE.   #CKD stage III-GFR-33; STABLE.   #Left upper lobe cystic lesion-[incidentally] CT scan- AUG 25th- 2021- CT scan-left upper lobe cystic lesion-4.5 cm-/slightly bigger by few mm- STABLE.   # COVID BOOSTER: Discussed given patient's diagnosis and other comorbidities/therapies-patient would be considered immunocompromised.  As per CDC recommendation/FDA approval-I would recommend booster vaccine.  Patient/daughter in interested.   #Disposition: #  HOLD Aranesp today # in 3 weeks- cbc possible aranesp # 6 weeks- cbc possible aranesp # 9 weeks- cbc possible aranesp # in 12 weeks MD- cbc/bmp/LDH/check iron studies/feritin; possible aranesp-Dr.B        Cammie Sickle, MD 02/19/2020 12:37 PM

## 2020-02-22 DIAGNOSIS — L97522 Non-pressure chronic ulcer of other part of left foot with fat layer exposed: Secondary | ICD-10-CM | POA: Diagnosis not present

## 2020-02-26 ENCOUNTER — Other Ambulatory Visit: Payer: Self-pay

## 2020-02-26 ENCOUNTER — Encounter: Payer: Self-pay | Admitting: Internal Medicine

## 2020-02-26 ENCOUNTER — Emergency Department: Payer: PPO

## 2020-02-26 ENCOUNTER — Observation Stay
Admission: EM | Admit: 2020-02-26 | Discharge: 2020-02-27 | Disposition: A | Discharge status: Home / Self Care | Payer: PPO | Attending: Hospitalist | Admitting: Hospitalist

## 2020-02-26 DIAGNOSIS — D696 Thrombocytopenia, unspecified: Secondary | ICD-10-CM | POA: Diagnosis not present

## 2020-02-26 DIAGNOSIS — I639 Cerebral infarction, unspecified: Principal | ICD-10-CM | POA: Insufficient documentation

## 2020-02-26 DIAGNOSIS — I610 Nontraumatic intracerebral hemorrhage in hemisphere, subcortical: Secondary | ICD-10-CM | POA: Diagnosis not present

## 2020-02-26 DIAGNOSIS — Z79899 Other long term (current) drug therapy: Secondary | ICD-10-CM | POA: Diagnosis not present

## 2020-02-26 DIAGNOSIS — N183 Chronic kidney disease, stage 3 unspecified: Secondary | ICD-10-CM | POA: Diagnosis not present

## 2020-02-26 DIAGNOSIS — I5022 Chronic systolic (congestive) heart failure: Secondary | ICD-10-CM | POA: Diagnosis not present

## 2020-02-26 DIAGNOSIS — Z7982 Long term (current) use of aspirin: Secondary | ICD-10-CM | POA: Insufficient documentation

## 2020-02-26 DIAGNOSIS — I680 Cerebral amyloid angiopathy: Secondary | ICD-10-CM | POA: Diagnosis present

## 2020-02-26 DIAGNOSIS — N400 Enlarged prostate without lower urinary tract symptoms: Secondary | ICD-10-CM | POA: Diagnosis present

## 2020-02-26 DIAGNOSIS — I618 Other nontraumatic intracerebral hemorrhage: Secondary | ICD-10-CM | POA: Diagnosis not present

## 2020-02-26 DIAGNOSIS — I6782 Cerebral ischemia: Secondary | ICD-10-CM | POA: Diagnosis not present

## 2020-02-26 DIAGNOSIS — Z951 Presence of aortocoronary bypass graft: Secondary | ICD-10-CM | POA: Diagnosis not present

## 2020-02-26 DIAGNOSIS — I130 Hypertensive heart and chronic kidney disease with heart failure and stage 1 through stage 4 chronic kidney disease, or unspecified chronic kidney disease: Secondary | ICD-10-CM | POA: Insufficient documentation

## 2020-02-26 DIAGNOSIS — R531 Weakness: Secondary | ICD-10-CM

## 2020-02-26 DIAGNOSIS — E854 Organ-limited amyloidosis: Secondary | ICD-10-CM | POA: Diagnosis present

## 2020-02-26 DIAGNOSIS — Z87891 Personal history of nicotine dependence: Secondary | ICD-10-CM | POA: Diagnosis not present

## 2020-02-26 DIAGNOSIS — I482 Chronic atrial fibrillation, unspecified: Secondary | ICD-10-CM | POA: Insufficient documentation

## 2020-02-26 DIAGNOSIS — R202 Paresthesia of skin: Secondary | ICD-10-CM | POA: Diagnosis not present

## 2020-02-26 DIAGNOSIS — I10 Essential (primary) hypertension: Secondary | ICD-10-CM | POA: Diagnosis not present

## 2020-02-26 DIAGNOSIS — Z20822 Contact with and (suspected) exposure to covid-19: Secondary | ICD-10-CM | POA: Diagnosis not present

## 2020-02-26 DIAGNOSIS — I2510 Atherosclerotic heart disease of native coronary artery without angina pectoris: Secondary | ICD-10-CM | POA: Insufficient documentation

## 2020-02-26 DIAGNOSIS — I255 Ischemic cardiomyopathy: Secondary | ICD-10-CM | POA: Insufficient documentation

## 2020-02-26 DIAGNOSIS — R29818 Other symptoms and signs involving the nervous system: Secondary | ICD-10-CM | POA: Diagnosis not present

## 2020-02-26 DIAGNOSIS — G319 Degenerative disease of nervous system, unspecified: Secondary | ICD-10-CM | POA: Diagnosis not present

## 2020-02-26 DIAGNOSIS — K219 Gastro-esophageal reflux disease without esophagitis: Secondary | ICD-10-CM | POA: Diagnosis present

## 2020-02-26 DIAGNOSIS — R5381 Other malaise: Secondary | ICD-10-CM | POA: Diagnosis not present

## 2020-02-26 DIAGNOSIS — J449 Chronic obstructive pulmonary disease, unspecified: Secondary | ICD-10-CM | POA: Diagnosis not present

## 2020-02-26 HISTORY — DX: Organ-limited amyloidosis: E85.4

## 2020-02-26 HISTORY — DX: Cerebral amyloid angiopathy: I68.0

## 2020-02-26 LAB — URINALYSIS, COMPLETE (UACMP) WITH MICROSCOPIC
Bacteria, UA: NONE SEEN
Bilirubin Urine: NEGATIVE
Glucose, UA: NEGATIVE mg/dL
Hgb urine dipstick: NEGATIVE
Ketones, ur: NEGATIVE mg/dL
Leukocytes,Ua: NEGATIVE
Nitrite: NEGATIVE
Protein, ur: NEGATIVE mg/dL
Specific Gravity, Urine: 1.011 (ref 1.005–1.030)
pH: 5 (ref 5.0–8.0)

## 2020-02-26 LAB — CBC
HCT: 27.5 % — ABNORMAL LOW (ref 39.0–52.0)
Hemoglobin: 9.2 g/dL — ABNORMAL LOW (ref 13.0–17.0)
MCH: 35.2 pg — ABNORMAL HIGH (ref 26.0–34.0)
MCHC: 33.5 g/dL (ref 30.0–36.0)
MCV: 105.4 fL — ABNORMAL HIGH (ref 80.0–100.0)
Platelets: 121 10*3/uL — ABNORMAL LOW (ref 150–400)
RBC: 2.61 MIL/uL — ABNORMAL LOW (ref 4.22–5.81)
RDW: 15.9 % — ABNORMAL HIGH (ref 11.5–15.5)
WBC: 9.8 10*3/uL (ref 4.0–10.5)
nRBC: 0 % (ref 0.0–0.2)

## 2020-02-26 LAB — COMPREHENSIVE METABOLIC PANEL
ALT: 17 U/L (ref 0–44)
AST: 19 U/L (ref 15–41)
Albumin: 4.3 g/dL (ref 3.5–5.0)
Alkaline Phosphatase: 55 U/L (ref 38–126)
Anion gap: 10 (ref 5–15)
BUN: 30 mg/dL — ABNORMAL HIGH (ref 8–23)
CO2: 25 mmol/L (ref 22–32)
Calcium: 9.1 mg/dL (ref 8.9–10.3)
Chloride: 102 mmol/L (ref 98–111)
Creatinine, Ser: 1.77 mg/dL — ABNORMAL HIGH (ref 0.61–1.24)
GFR, Estimated: 34 mL/min — ABNORMAL LOW (ref >60)
Glucose, Bld: 101 mg/dL — ABNORMAL HIGH (ref 70–99)
Potassium: 4.4 mmol/L (ref 3.5–5.1)
Sodium: 137 mmol/L (ref 135–145)
Total Bilirubin: 0.9 mg/dL (ref 0.3–1.2)
Total Protein: 7.5 g/dL (ref 6.5–8.1)

## 2020-02-26 LAB — RESPIRATORY PANEL BY RT PCR (FLU A&B, COVID)
Influenza A by PCR: NEGATIVE
Influenza B by PCR: NEGATIVE
SARS Coronavirus 2 by RT PCR: NEGATIVE

## 2020-02-26 LAB — DIGOXIN LEVEL: Digoxin Level: 1.1 ng/mL (ref 1.0–2.0)

## 2020-02-26 LAB — TROPONIN I (HIGH SENSITIVITY)
Troponin I (High Sensitivity): 22 ng/L — ABNORMAL HIGH (ref <18)
Troponin I (High Sensitivity): 21 ng/L — ABNORMAL HIGH (ref <18)

## 2020-02-26 MED ORDER — STROKE: EARLY STAGES OF RECOVERY BOOK: Freq: Once | Status: DC

## 2020-02-26 MED ORDER — METOPROLOL TARTRATE 25 MG PO TABS
12.5000 mg | ORAL_TABLET | Freq: Two times a day (BID) | ORAL | Status: DC
  Administered 2020-02-26 – 2020-02-27 (×2): 12.5 mg via ORAL
  Filled 2020-02-26 (×2): qty 1

## 2020-02-26 MED ORDER — ASCORBIC ACID 500 MG PO TABS
500.0000 mg | ORAL_TABLET | Freq: Every day | ORAL | Status: DC
  Administered 2020-02-27: 500 mg via ORAL
  Filled 2020-02-26: qty 1

## 2020-02-26 MED ORDER — ACETAMINOPHEN 650 MG RE SUPP: 650.0000 mg | RECTAL | Status: DC | PRN

## 2020-02-26 MED ORDER — ACETAMINOPHEN 325 MG PO TABS: 650.0000 mg | ORAL_TABLET | ORAL | Status: DC | PRN

## 2020-02-26 MED ORDER — TAMSULOSIN HCL 0.4 MG PO CAPS
0.4000 mg | ORAL_CAPSULE | Freq: Every day | ORAL | Status: DC
  Administered 2020-02-26: 0.4 mg via ORAL
  Filled 2020-02-26: qty 1

## 2020-02-26 MED ORDER — VITAMIN B-12 1000 MCG PO TABS
500.0000 ug | ORAL_TABLET | Freq: Every day | ORAL | Status: DC
  Administered 2020-02-27: 500 ug via ORAL
  Filled 2020-02-26 (×2): qty 1

## 2020-02-26 MED ORDER — DIGOXIN 125 MCG PO TABS
0.0625 mg | ORAL_TABLET | Freq: Every day | ORAL | Status: DC
  Administered 2020-02-27: 0.0625 mg via ORAL
  Filled 2020-02-26 (×2): qty 0.5

## 2020-02-26 MED ORDER — FAMOTIDINE 20 MG PO TABS
20.0000 mg | ORAL_TABLET | Freq: Every day | ORAL | Status: DC
  Administered 2020-02-26: 20 mg via ORAL
  Filled 2020-02-26: qty 1

## 2020-02-26 MED ORDER — SUCRALFATE 1 G PO TABS
1.0000 g | ORAL_TABLET | Freq: Two times a day (BID) | ORAL | Status: DC
  Administered 2020-02-26 – 2020-02-27 (×2): 1 g via ORAL
  Filled 2020-02-26 (×2): qty 1

## 2020-02-26 MED ORDER — ATORVASTATIN CALCIUM 20 MG PO TABS
40.0000 mg | ORAL_TABLET | Freq: Every day | ORAL | Status: DC
  Administered 2020-02-26: 40 mg via ORAL
  Filled 2020-02-26: qty 2

## 2020-02-26 MED ORDER — SODIUM CHLORIDE 0.9 % IV SOLN: INTRAVENOUS | Status: DC

## 2020-02-26 MED ORDER — FERROUS SULFATE 325 (65 FE) MG PO TABS
325.0000 mg | ORAL_TABLET | ORAL | Status: DC
  Administered 2020-02-27: 325 mg via ORAL
  Filled 2020-02-26 (×2): qty 1

## 2020-02-26 MED ORDER — FUROSEMIDE 20 MG PO TABS
20.0000 mg | ORAL_TABLET | Freq: Every day | ORAL | Status: DC
  Administered 2020-02-27: 20 mg via ORAL
  Filled 2020-02-26: qty 1

## 2020-02-26 MED ORDER — ACETAMINOPHEN 160 MG/5ML PO SOLN
650.0000 mg | ORAL | Status: DC | PRN
  Filled 2020-02-26: qty 20.3

## 2020-02-26 MED ORDER — BACITRACIN-NEOMYCIN-POLYMYXIN 400-5-5000 EX OINT
1.0000 "application " | TOPICAL_OINTMENT | CUTANEOUS | Status: DC
  Administered 2020-02-27: 1 via TOPICAL
  Filled 2020-02-26: qty 1

## 2020-02-26 MED ORDER — RANOLAZINE ER 500 MG PO TB12
500.0000 mg | ORAL_TABLET | Freq: Two times a day (BID) | ORAL | Status: DC
  Administered 2020-02-26 – 2020-02-27 (×2): 500 mg via ORAL
  Filled 2020-02-26 (×5): qty 1

## 2020-02-26 NOTE — ED Triage Notes (Addendum)
Pt in with co generalized weakness and tingling all over that started tonight. States tingling episode lasted 15 min and symptoms have subsided.  States does have loss of taste, no fever, no shob or cough. Pt also co slight headache.

## 2020-02-26 NOTE — H&P (Addendum)
History and Physical    [name]:[id number] DOB: 1930/09/23 DOA: 02/26/2020  PCP: Rusty Aus, MD   Patient coming from: Home  I have personally briefly reviewed patient's old medical records in [place]  Chief Complaint: Loss of taste  HPI: Andres Gutierrez is a 86 y.o. male with medical history significant for chronic anemia and thrombocytopenia, stage III chronic kidney disease, CHF and anxiety who was brought into the emergency room by his daughter for evaluation of multiple symptoms which include transient loss of taste which has improved but not back to baseline, generalized weakness and a headache.  Patient states that he was in his usual state of health until about 1 PM the day prior to his admission when his daughter brought him a sandwich to eat for lunch and he was unable to taste his food.  He also use of tobacco and stated that he was unable to taste that as well.  He complained of generalized weakness later in the day which has improved and tingling all over which has resolved. He was concerned that he may have contracted the COVID-19 virus because his grandson was infected a couple of weeks ago had told him that he lost his taste. He denies having any fever, no chills, no cough, no shortness of breath, no myalgias, no dizziness, no lightheadedness, no abdominal pain or any changes in his bowel habits. Labs show sodium 137, potassium 4.4, chloride 102, bicarb 25, BUN 30, creatinine 1.7, calcium 9.8, alkaline phosphatase 55, albumin 4.3, AST 19, ALT 17, total protein 7.5, troponin 21, white count 9.8, hemoglobin 9.2, hematocrit 27.5, MCV 105, RDW 15.9, platelet count 121 Respiratory viral panel is negative Chest x-ray reviewed by me shows no acute cardiopulmonary disease.  Hyperinflated lung fields. CT scan of the head without contrast shows no acute abnormality.  Atrophy and chronic microvascular ischemic change. MRI of the brain shows no large vessel occlusion,  high-grade narrowing or aneurysm.  Shows a small focus of left occipital intraparenchymal hemorrhage likely subacute. Twelve-lead EKG is pending at the time of this history and physical  ED Course: Patient is an 86 year old male who presents to the ER for evaluation of multiple symptoms which include loss of taste which was transient, generalized weakness and tingling as well as a headache.  He had an MRI of the brain done without contrast which shows a small focus of left occipital intraparenchymal hemorrhage likely subacute.  Patient will be referred to observation status for further evaluation.  Review of Systems: As per HPI otherwise 10 point review of systems negative.    Past Medical History:  Diagnosis Date  . Anemia   . Anxiety   . Cardiomyopathy ([place])   . CHF (congestive heart failure) ([place])   . Chronic kidney disease    kidney stones  . Coronary artery disease   . Cough   . Dyspnea   . Hypertension   . Lymphadenopathy, hilar   . MI (myocardial infarction) ([place])    x 2  . Wheezing     Past Surgical History:  Procedure Laterality Date  . BRONCHIAL NEEDLE ASPIRATION BIOPSY N/A 12/31/2014   Procedure: BRONCHIAL NEEDLE ASPIRATION BIOPSIES from [place]ina;  Surgeon: Flora Lipps, MD;  Location: [facility];  Service: Cardiopulmonary;  Laterality: N/A;  . CARDIAC CATHETERIZATION N/A 12/28/2015   Procedure: Left Heart Cath and Cors/Grafts Angiography;  Surgeon: Yolonda Kida, MD;  Location: [facility];  Service: Cardiovascular;  Laterality: N/A;  .  CATARACT EXTRACTION W/PHACO Left 08/01/2016   Procedure: CATARACT EXTRACTION PHACO AND INTRAOCULAR LENS PLACEMENT ([place]) Left;  Surgeon: Leandrew Koyanagi, MD;  Location: [place];  Service: Ophthalmology;  Laterality: Left;  . COLON SURGERY    . CORONARY ANGIOPLASTY WITH STENT PLACEMENT    . CORONARY ARTERY BYPASS GRAFT    . ENDOBRONCHIAL ULTRASOUND N/A 12/31/2014   Procedure: ENDOBRONCHIAL ULTRASOUND;  Surgeon:  Flora Lipps, MD;  Location: [facility];  Service: Cardiopulmonary;  Laterality: N/A;  . ESOPHAGOGASTRODUODENOSCOPY (EGD) WITH PROPOFOL N/A 06/20/2015   Procedure: ESOPHAGOGASTRODUODENOSCOPY (EGD) WITH PROPOFOL;  Surgeon: Lollie Sails, MD;  Location[facility] END[facility];  Service: Endoscopy;  Laterality: N/A;  . IR CATHETER TUBE CHANGE  10/26/2019  . IR CHOLANGIOGRAM EXISTING TUBE  08/13/2019     reports that he quit smoking about 42 years ago. His smokeless tobacco use includes chew. He reports current alcohol use. He reports that he does not use drugs.  Allergies  Allergen Reactions  . Levaquin [Levofloxacin]     [name], her daughter, said Levaquin should be avoided because when patient had Levaquin a few years ago, he had "a bad reaction".  She said he could not walk and could not lift up his feet.  . 5ht3 Receptor Antagonists   . Diphenhydramine Hcl Other (See Comments)    Reaction:  Rash and fever a long time ago, but has had it since with no problem.  . Escitalopram Other (See Comments)    Reaction:  Makes him feel faint, like he was going to have a heart attack.  . Maxidex [Dexamethasone] Other (See Comments)    Reaction:  Unknown   . Prednisone Other (See Comments)    Pt states that this medication made him feel crazy.    . Serotonin Other (See Comments)    Tried 2 different types, lexapro and another one.  Reaction:  Made him feel like he was having a heart attack.   . Vytorin [Ezetimibe-Simvastatin] Other (See Comments)    Reaction:  Unknown   . Zocor [Simvastatin] Other (See Comments)    Reaction:  Unknown   . Diltiazem Rash    Family History  Problem Relation Age of Onset  . CAD Mother   . Colon cancer Father      Prior to Admission medications   Medication Sig Start Date End Date Taking? Authorizing Provider  Ascorbic Acid (VITAMIN C PO) Take 1 tablet by mouth daily.   Yes [provider]  aspirin 81 MG EC tablet Take 1 tablet (81 mg total) by mouth daily.  RESUME 48HRS after surgery 11/06/19  Yes Sakai, Isami, DO  atorvastatin (LIPITOR) 40 MG tablet Take 1 tablet (40 mg total) by mouth daily at 6 PM. Patient taking differently: Take 40 mg by mouth daily with supper.  12/30/15  Yes Dustin Flock, MD  digoxin (LANOXIN) 0.125 MG tablet Take 0.0625 mg by mouth daily.   Yes [provider]  famotidine (PEPCID) 20 MG tablet Take 20 mg by mouth at bedtime. 02/04/19  Yes [provider]  ferrous sulfate 325 (65 FE) MG tablet Take 1 tablet (325 mg total) by mouth every other day. Patient taking differently: Take 325 mg by mouth every Monday, Wednesday, and Friday. With supper 03/04/19  Yes Verlon Au, NP  furosemide (LASIX) 40 MG tablet Take 1 tablet (40 mg total) by mouth daily. Patient taking differently: Take 20 mg by mouth daily.  06/10/19  Yes Danford, Suann Larry, MD  metoprolol tartrate (LOPRESSOR)  25 MG tablet Take 12.5 mg by mouth 2 (two) times daily.   Yes [provider]  neomycin-bacitracin-polymyxin (NEOSPORIN) ointment Apply 1 application topically every other day. Wound care (foot area)   Yes [provider]  ranolazine (RANEXA) 1000 MG SR tablet Take 500 mg by mouth 2 (two) times daily.  01/16/19  Yes [provider]  sucralfate (CARAFATE) 1 G tablet Take 1 g by mouth 2 (two) times daily.    Yes [provider]  tamsulosin (FLOMAX) 0.4 MG CAPS capsule Take 0.4 mg by mouth daily after supper.    Yes [provider]  vitamin B-12 (CYANOCOBALAMIN) 500 MCG tablet Take 500 mcg by mouth daily.   Yes [provider]    Physical Exam: Vitals:   02/26/20 0536 02/26/20 0538  BP: (!) 125/56   Pulse: 71   Resp: 20   Temp: 97.7 F (36.5 C)   TempSrc: Oral   SpO2: 97%   Weight:  81.6 kg     Vitals:   02/26/20 0536 02/26/20 0538  BP: (!) 125/56   Pulse: 71   Resp: 20   Temp: 97.7 F (36.5 C)   TempSrc: Oral   SpO2: 97%   Weight:  81.6 kg    Constitutional:  NAD, alert and oriented x 3 Eyes: PERRL, lids and conjunctivae pallor ENMT: Mucous membranes are moist.  Neck: normal, supple, no masses, no thyromegaly Respiratory: clear to auscultation bilaterally, no wheezing, no crackles. Normal respiratory effort. No accessory muscle use.  Cardiovascular: Regular rate and rhythm,no murmurs / rubs / gallops. No extremity edema. 2+ pedal pulses. No carotid bruits.  Abdomen: no tenderness, no masses palpated. No hepatosplenomegaly. Bowel sounds positive.  Musculoskeletal: no clubbing / cyanosis. No joint deformity upper and lower extremities.  Skin: no rashes, lesions, ulcers.  Neurologic: No gross focal neurologic deficit.  Able to move all his extremities Psychiatric: Normal mood and affect.   Labs on Admission: I have personally reviewed following labs and imaging studies  CBC: Recent Labs  Lab 02/26/20 0542  WBC 9.8  HGB 9.2*  HCT 27.5*  MCV 105.4*  PLT 948*   Basic Metabolic Panel: Recent Labs  Lab 02/26/20 0542  NA 137  K 4.4  CL 102  CO2 25  GLUCOSE 101*  BUN 30*  CREATININE 1.77*  CALCIUM 9.1   GFR: Estimated Creatinine Clearance: 34.6 mL/min (A) (by C-G formula based on SCr of 1.77 mg/dL (H)). Liver Function Tests: Recent Labs  Lab 02/26/20 0542  AST 19  ALT 17  ALKPHOS 55  BILITOT 0.9  PROT 7.5  ALBUMIN 4.3   No results for input(s): LIPASE, AMYLASE in the last 168 hours. No results for input(s): AMMONIA in the last 168 hours. Coagulation Profile: No results for input(s): INR, PROTIME in the last 168 hours. Cardiac Enzymes: No results for input(s): CKTOTAL, CKMB, CKMBINDEX, TROPONINI in the last 168 hours. BNP (last 3 results) No results for input(s): PROBNP in the last 8760 hours. HbA1C: No results for input(s): HGBA1C in the last 72 hours. CBG: No results for input(s): GLUCAP in the last 168 hours. Lipid Profile: No results for input(s): CHOL, HDL, LDLCALC, TRIG, CHOLHDL, LDLDIRECT in the last 72  hours. Thyroid Function Tests: No results for input(s): TSH, T4TOTAL, FREET4, T3FREE, THYROIDAB in the last 72 hours. Anemia Panel: No results for input(s): VITAMINB12, FOLATE, FERRITIN, TIBC, IRON, RETICCTPCT in the last 72 hours. Urine analysis:    Component Value Date/Time   COLORURINE YELLOW (A)  02/26/2020 1250   APPEARANCEUR CLEAR (A) 02/26/2020 1250   LABSPEC 1.011 02/26/2020 1250   PHURINE 5.0 02/26/2020 1250   GLUCOSEU NEGATIVE 02/26/2020 1250   HGBUR NEGATIVE 02/26/2020 1250   BILIRUBINUR NEGATIVE 02/26/2020 1250   KETONESUR NEGATIVE 02/26/2020 1250   PROTEINUR NEGATIVE 02/26/2020 1250   UROBILINOGEN 0.2 09/11/2010 0647   NITRITE NEGATIVE 02/26/2020 1250   LEUKOCYTESUR NEGATIVE 02/26/2020 1250    Radiological Exams on Admission: DG Chest 2 View  Result Date: 02/26/2020 CLINICAL DATA:  Weakness. EXAM: CHEST - 2 VIEW COMPARISON:  Generalized weakness.  Loss of taste. FINDINGS: Heart size upper limits of normal. Changes of COPD are present. No focal airspace disease is evident. Degenerative changes are present in the thoracic spine. Patient is status post CABG. IMPRESSION: 1. No acute cardiopulmonary disease. 2. Changes of COPD. Electronically Signed   By: San Morelle M.D.   On: 02/26/2020 06:14   CT Head Wo Contrast  Result Date: 02/26/2020 CLINICAL DATA:  Diffuse weakness and tingling which began last night. EXAM: CT HEAD WITHOUT CONTRAST TECHNIQUE: Contiguous axial images were obtained from the base of the skull through the vertex without intravenous contrast. COMPARISON:  Head CT scan 01/07/2019. FINDINGS: Brain: No evidence of acute infarction, hemorrhage, hydrocephalus, extra-axial collection or mass lesion/mass effect. Atrophy, chronic microvascular ischemic change and remote left cerebellar lacunar infarct noted. Vascular: No hyperdense vessel or unexpected calcification. Skull: Intact.  No focal lesion. Sinuses/Orbits: Negative. Other: None. IMPRESSION: No acute  abnormality. Atrophy and chronic microvascular ischemic change. Electronically Signed   By: Inge Rise M.D.   On: 02/26/2020 09:42   MR ANGIO HEAD WO CONTRAST  Result Date: 02/26/2020 CLINICAL DATA:  Neuro deficit.  Weakness and tingling.  Headache. EXAM: MRI HEAD WITHOUT CONTRAST MRA HEAD WITHOUT CONTRAST TECHNIQUE: Multiplanar, multiecho pulse sequences of the brain and surrounding structures were obtained without intravenous contrast. Angiographic images of the head were obtained using MRA technique without contrast. COMPARISON:  02/26/2020 head CT and prior. FINDINGS: Image quality is mildly degraded by motion artifact. MRI HEAD FINDINGS Brain: No diffusion-weighted signal abnormality. Left occipital SWI signal dropout with correlative intrinsically T1/FLAIR hyperintense signal (16:57). No midline shift, ventriculomegaly or extra-axial fluid collection. No mass lesion. Scattered bilateral cerebral and cerebellar remote microhemorrhages. Mild cerebral atrophy with ex vacuo dilatation. Moderate chronic microvascular ischemic changes. Vascular: Please see MRA head. Skull and upper cervical spine: Normal marrow signal. Sinuses/Orbits: Sequela of bilateral lens replacement. Minimal maxillary sinus mucosal thickening. Trace right mastoid free fluid. Other: None. MRA HEAD FINDINGS Anterior circulation: Patent normal caliber appearance of the bilateral ICAs, anterior and middle cerebral arteries. The right posterior communicating artery may be absent. Left PCOM hypoplasia. No significant stenosis, proximal occlusion, aneurysm, or vascular malformation. Posterior circulation: Dominant left vertebral artery. Proximally patent right PICA. Patent normal caliber basilar, superior cerebellar and posterior cerebral arteries. Patent normal caliber bilateral AICA. No significant stenosis, proximal occlusion, aneurysm, or vascular malformation. Venous sinuses: No evidence of thrombosis. Anatomic variants: Discussed  above. IMPRESSION: MRI head: Small focus of left occipital intraparenchymal hemorrhage, likely subacute. Sequela of cerebral amyloid angiopathy. Mild cerebral atrophy. Moderate chronic microvascular ischemic changes. MRA head: No large vessel occlusion, high-grade narrowing or aneurysm. These results were called by telephone at the time of interpretation on 02/26/2020 at 11:45 am to provider Merlyn Lot , who verbally acknowledged these results. Electronically Signed   By: Primitivo Gauze M.D.   On: 02/26/2020 11:55   MR BRAIN WO CONTRAST  Result Date: 02/26/2020 CLINICAL  DATA:  Neuro deficit.  Weakness and tingling.  Headache. EXAM: MRI HEAD WITHOUT CONTRAST MRA HEAD WITHOUT CONTRAST TECHNIQUE: Multiplanar, multiecho pulse sequences of the brain and surrounding structures were obtained without intravenous contrast. Angiographic images of the head were obtained using MRA technique without contrast. COMPARISON:  02/26/2020 head CT and prior. FINDINGS: Image quality is mildly degraded by motion artifact. MRI HEAD FINDINGS Brain: No diffusion-weighted signal abnormality. Left occipital SWI signal dropout with correlative intrinsically T1/FLAIR hyperintense signal (16:57). No midline shift, ventriculomegaly or extra-axial fluid collection. No mass lesion. Scattered bilateral cerebral and cerebellar remote microhemorrhages. Mild cerebral atrophy with ex vacuo dilatation. Moderate chronic microvascular ischemic changes. Vascular: Please see MRA head. Skull and upper cervical spine: Normal marrow signal. Sinuses/Orbits: Sequela of bilateral lens replacement. Minimal maxillary sinus mucosal thickening. Trace right mastoid free fluid. Other: None. MRA HEAD FINDINGS Anterior circulation: Patent normal caliber appearance of the bilateral ICAs, anterior and middle cerebral arteries. The right posterior communicating artery may be absent. Left PCOM hypoplasia. No significant stenosis, proximal occlusion, aneurysm,  or vascular malformation. Posterior circulation: Dominant left vertebral artery. Proximally patent right PICA. Patent normal caliber basilar, superior cerebellar and posterior cerebral arteries. Patent normal caliber bilateral AICA. No significant stenosis, proximal occlusion, aneurysm, or vascular malformation. Venous sinuses: No evidence of thrombosis. Anatomic variants: Discussed above. IMPRESSION: MRI head: Small focus of left occipital intraparenchymal hemorrhage, likely subacute. Sequela of cerebral amyloid angiopathy. Mild cerebral atrophy. Moderate chronic microvascular ischemic changes. MRA head: No large vessel occlusion, high-grade narrowing or aneurysm. These results were called by telephone at the time of interpretation on 02/26/2020 at 11:45 am to provider Merlyn Lot , who verbally acknowledged these results. Electronically Signed   By: Primitivo Gauze M.D.   On: 02/26/2020 11:55    EKG: Independently reviewed.   Assessment/Plan Principal Problem:   CVA (cerebral vascular accident) ([place]) Active Problems:   HTN (hypertension)   Chronic atrial fibrillation (HCC)   GERD (gastroesophageal reflux disease)   Chronic systolic heart failure (HCC)   BPH (benign prostatic hyperplasia)   Stage 3 chronic kidney disease (HCC)   Thrombocytopenia (HCC)   Cardiomyopathy, ischemic      CVA ??  Subacute Patient presents for evaluation of transient loss of taste which has improved but not back to baseline, generalized weakness and a headache which has resolved MRI of the brain showed shows a small focus of left occipital intraparenchymal hemorrhage likely subacute. Patient has no focal deficits We will repeat a CT scan of the brain without contrast in 24 hours Hold aspirin Continue atorvastatin Optimize blood pressure control Obtain 2D echocardiogram to assess LVEF We will request PT/ST/OT evaluation Consult neurology    Stage III chronic kidney disease Stable   Chronic  anemia/thrombocytopenia Stable Continue ascorbic acid and iron supplements    Chronic systolic heart failure Last known LVEF 25 to 30% Continue furosemide and metoprolol Patient not on an ACE inhibitor or ARB due to renal insufficiency    History of A. Fib Continue digoxin for rate control Patient not on long-term anticoagulation therapy due to thrombocytopenia with increased risk for bleeding    Ischemic cardiomyopathy Last known LVEF 25 to 30% Continue metoprolol, statins and Ranexa   BPH Continue Flomax     DVT prophylaxis: SCD Code Status: Full code Family Communication: Greater than 50% of time was spent discussing patient's condition and plan of care with him at the bedside.  All questions and concerns have been addressed.  He verbalizes understanding and agrees  with the plan. Disposition Plan: Back to previous home environment Consults called: Neurology    Collier Bullock MD [facility]     02/26/2020, 2:16 PM

## 2020-02-26 NOTE — ED Notes (Signed)
Attempted to call report to the floor. RN not available.

## 2020-02-26 NOTE — Plan of Care (Signed)
  Problem: Education: Goal: Knowledge of disease or condition will improve Outcome: Not Progressing Goal: Knowledge of secondary prevention will improve Outcome: Not Progressing Goal: Knowledge of patient specific risk factors addressed and post discharge goals established will improve Outcome: Not Progressing Goal: Individualized Educational Video(s) Outcome: Not Progressing   Problem: Coping: Goal: Will verbalize positive feelings about self Outcome: Not Progressing Goal: Will identify appropriate support needs Outcome: Not Progressing   Problem: Self-Care: Goal: Ability to participate in self-care as condition permits will improve Outcome: Not Progressing Goal: Verbalization of feelings and concerns over difficulty with self-care will improve Outcome: Not Progressing Goal: Ability to communicate needs accurately will improve Outcome: Not Progressing   Problem: Nutrition: Goal: Risk of aspiration will decrease Outcome: Not Progressing Goal: Dietary intake will improve Outcome: Not Progressing   Problem: Intracerebral Hemorrhage Tissue Perfusion: Goal: Complications of Intracerebral Hemorrhage will be minimized Outcome: Not Progressing   Problem: Ischemic Stroke/TIA Tissue Perfusion: Goal: Complications of ischemic stroke/TIA will be minimized Outcome: Not Progressing   Problem: Spontaneous Subarachnoid Hemorrhage Tissue Perfusion: Goal: Complications of Spontaneous Subarachnoid Hemorrhage will be minimized Outcome: Not Progressing

## 2020-02-26 NOTE — ED Notes (Signed)
Patient to be moved to [place]

## 2020-02-26 NOTE — ED Provider Notes (Signed)
[facility] Emergency Department Provider Note    First MD Initiated Contact with Patient 02/26/20 0818     (approximate)  I have reviewed the triage vital signs and the nursing notes.   HISTORY  Chief Complaint Weakness    HPI Andres Gutierrez is a 86 y.o. male presents to the ER for evaluation of generalized tingling weakness as well as loss of taste.  Symptoms lasted about 15 minutes as far as the weakness and tingling ago and that occurred this morning.  States he still having loss of taste.  Still has smell.  He has several friends that were sick with Covid so is primarily concerned that he had Covid.  States he otherwise feels well at this time.  Denies any dysarthria or confusion.  No headache.  No congestion.  No chest pain or shortness of breath.   He denies any visual disturbances.   Past Medical History:  Diagnosis Date  . Anemia   . Anxiety   . Cardiomyopathy ([place])   . CHF (congestive heart failure) ([place])   . Chronic kidney disease    kidney stones  . Coronary artery disease   . Cough   . Dyspnea   . Hypertension   . Lymphadenopathy, hilar   . MI (myocardial infarction) ([place])    x 2  . Wheezing    Family History  Problem Relation Age of Onset  . CAD Mother   . Colon cancer Father    Past Surgical History:  Procedure Laterality Date  . BRONCHIAL NEEDLE ASPIRATION BIOPSY N/A 12/31/2014   Procedure: BRONCHIAL NEEDLE ASPIRATION BIOPSIES from carina;  Surgeon: Flora Lipps, MD;  Location: [facility] ORS;  Service: Cardiopulmonary;  Laterality: N/A;  . CARDIAC CATHETERIZATION N/A 12/28/2015   Procedure: Left Heart Cath and Cors/Grafts Angiography;  Surgeon: Yolonda Kida, MD;  Location: [facility];  Service: Cardiovascular;  Laterality: N/A;  . CATARACT EXTRACTION W/PHACO Left 08/01/2016   Procedure: CATARACT EXTRACTION PHACO AND INTRAOCULAR LENS PLACEMENT ([place]) Left;  Surgeon: Leandrew Koyanagi, MD;  Location: [facility];   Service: Ophthalmology;  Laterality: Left;  . COLON SURGERY    . CORONARY ANGIOPLASTY WITH STENT PLACEMENT    . CORONARY ARTERY BYPASS GRAFT    . ENDOBRONCHIAL ULTRASOUND N/A 12/31/2014   Procedure: ENDOBRONCHIAL ULTRASOUND;  Surgeon: Flora Lipps, MD;  Location: [facility] ORS;  Service: Cardiopulmonary;  Laterality: N/A;  . ESOPHAGOGASTRODUODENOSCOPY (EGD) WITH PROPOFOL N/A 06/20/2015   Procedure: ESOPHAGOGASTRODUODENOSCOPY (EGD) WITH PROPOFOL;  Surgeon: Lollie Sails, MD;  Location: [facility];  Service: Endoscopy;  Laterality: N/A;  . IR CATHETER TUBE CHANGE  10/26/2019  . IR CHOLANGIOGRAM EXISTING TUBE  08/13/2019   Patient Active Problem List   Diagnosis Date Noted  . CVA (cerebral vascular accident) ([place]) 02/26/2020  . Chronic cholecystitis 11/06/2019  . Malnutrition of moderate degree 07/30/2019  . Acute neutrophilia 07/30/2019  . Sepsis ([place]) 07/29/2019  . Acute cholecystitis 07/29/2019  . Foot infection   . Acute on chronic heart failure ([place]) 06/07/2019  . Weakness   . NSTEMI (non-ST elevated myocardial infarction) ([place])   . Orthostatic hypotension 01/07/2019  . Macrocytic anemia 08/05/2018  . Anemia of chronic kidney failure, stage 3 (moderate) ([place]) 08/05/2018  . BPH (benign prostatic hyperplasia) 07/24/2018  . Gastroesophageal reflux disease with esophagitis 07/24/2018  . Stage 3 chronic kidney disease ([place]) 07/24/2018  . Thrombocytopenia ([place]) 07/24/2018  . Primary osteoarthritis of both knees 08/22/2017  . Medicare annual wellness visit, initial  11/27/2016  . Chronic systolic heart failure ([place]) 01/17/2016  . Chewing tobacco use 01/17/2016  . Atrial fibrillation with RVR ([place]) 12/23/2015  . Dyspnea on exertion 12/23/2015  . Elevated troponin 12/23/2015  . GERD (gastroesophageal reflux disease) 12/23/2015  . Iron deficiency anemia due to chronic blood loss 12/20/2015  . Femoral neck fracture, left, closed, initial encounter 11/03/2015  . Chronic atrial fibrillation  ([place])   . Coronary artery disease involving native coronary artery of native heart without angina pectoris   . Acalculous cholecystitis 10/29/2015  . B12 deficiency 08/03/2015  . Erosive esophagitis 08/03/2015  . Adenopathy   . CAD (coronary artery disease) 12/22/2014  . HTN (hypertension) 12/22/2014  . Cardiomyopathy, ischemic 07/27/2014      Prior to Admission medications   Medication Sig Start Date End Date Taking? Authorizing Provider  Ascorbic Acid (VITAMIN C PO) Take 1 tablet by mouth daily.   Yes [provider]  aspirin 81 MG EC tablet Take 1 tablet (81 mg total) by mouth daily. RESUME 48HRS after surgery 11/06/19  Yes Sakai, Isami, DO  atorvastatin (LIPITOR) 40 MG tablet Take 1 tablet (40 mg total) by mouth daily at 6 PM. Patient taking differently: Take 40 mg by mouth daily with supper.  12/30/15  Yes Dustin Flock, MD  digoxin (LANOXIN) 0.125 MG tablet Take 0.0625 mg by mouth daily.   Yes [provider]  famotidine (PEPCID) 20 MG tablet Take 20 mg by mouth at bedtime. 02/04/19  Yes [provider]  ferrous sulfate 325 (65 FE) MG tablet Take 1 tablet (325 mg total) by mouth every other day. Patient taking differently: Take 325 mg by mouth every Monday, Wednesday, and Friday. With supper 03/04/19  Yes Verlon Au, NP  furosemide (LASIX) 40 MG tablet Take 1 tablet (40 mg total) by mouth daily. Patient taking differently: Take 20 mg by mouth daily.  06/10/19  Yes Danford, Suann Larry, MD  metoprolol tartrate (LOPRESSOR) 25 MG tablet Take 12.5 mg by mouth 2 (two) times daily.   Yes [provider]  neomycin-bacitracin-polymyxin (NEOSPORIN) ointment Apply 1 application topically every other day. Wound care (foot area)   Yes [provider]  ranolazine (RANEXA) 1000 MG SR tablet Take 500 mg by mouth 2 (two) times daily.  01/16/19  Yes [provider]  sucralfate (CARAFATE) 1 G tablet Take 1 g by mouth 2 (two) times daily.    Yes  [provider]  tamsulosin (FLOMAX) 0.4 MG CAPS capsule Take 0.4 mg by mouth daily after supper.    Yes [provider]  vitamin B-12 (CYANOCOBALAMIN) 500 MCG tablet Take 500 mcg by mouth daily.   Yes [provider]    Allergies Levaquin [levofloxacin], 5ht3 receptor antagonists, Diphenhydramine hcl, Escitalopram, Maxidex [dexamethasone], Prednisone, Serotonin, Vytorin [ezetimibe-simvastatin], Zocor [simvastatin], and Diltiazem    Social History Social History   Tobacco Use  . Smoking status: Former Smoker    Quit date: 06/27/1977    Years since quitting: 42.6  . Smokeless tobacco: Current User    Types: Chew  Vaping Use  . Vaping Use: Never used  Substance Use Topics  . Alcohol use: Yes    Comment: occational almost rare once a year  . Drug use: No    Review of Systems Patient denies headaches, rhinorrhea, blurry vision, numbness, shortness of breath, chest pain, edema, cough, abdominal pain, nausea, vomiting, diarrhea, dysuria, fevers, rashes or hallucinations unless otherwise stated above in HPI. ____________________________________________   PHYSICAL EXAM:  VITAL  SIGNS: Vitals:   02/26/20 0536  BP: (!) 125/56  Pulse: 71  Resp: 20  Temp: 97.7 F (36.5 C)  SpO2: 97%   Constitutional: Alert and oriented.  Eyes: Conjunctivae are normal.  Head: Atraumatic. Nose: No congestion/rhinnorhea. Mouth/Throat: Mucous membranes are moist.   Neck: No stridor. Painless ROM.  Cardiovascular: Normal rate, regular rhythm. Grossly normal heart sounds.  Good peripheral circulation. Respiratory: Normal respiratory effort.  No retractions. Lungs CTAB. Gastrointestinal: Soft and nontender. No distention. No abdominal bruits. No CVA tenderness. Genitourinary:  Musculoskeletal: No lower extremity tenderness nor edema.  No joint effusions. Neurologic:  CN- intact.  No facial droop, Normal FNF.  Normal heel to shin.  Sensation intact bilaterally. Normal  speech and language. No gross focal neurologic deficits are appreciated. No gait instability. Skin:  Skin is warm, dry and intact. No rash noted. Psychiatric: Mood and affect are normal. Speech and behavior are normal.  ____________________________________________   LABS (all labs ordered are listed, but only abnormal results are displayed)  Results for orders placed or performed during the hospital encounter of 02/26/20 (from the past 24 hour(s))  CBC     Status: Abnormal   Collection Time: 02/26/20  5:42 AM  Result Value Ref Range   WBC 9.8 4.0 - 10.5 K/uL   RBC 2.61 (L) 4.22 - 5.81 MIL/uL   Hemoglobin 9.2 (L) 13.0 - 17.0 g/dL   HCT 27.5 (L) 39 - 52 %   MCV 105.4 (H) 80.0 - 100.0 fL   MCH 35.2 (H) 26.0 - 34.0 pg   MCHC 33.5 30.0 - 36.0 g/dL   RDW 15.9 (H) 11.5 - 15.5 %   Platelets 121 (L) 150 - 400 K/uL   nRBC 0.0 0.0 - 0.2 %  Comprehensive metabolic panel     Status: Abnormal   Collection Time: 02/26/20  5:42 AM  Result Value Ref Range   Sodium 137 135 - 145 mmol/L   Potassium 4.4 3.5 - 5.1 mmol/L   Chloride 102 98 - 111 mmol/L   CO2 25 22 - 32 mmol/L   Glucose, Bld 101 (H) 70 - 99 mg/dL   BUN 30 (H) 8 - 23 mg/dL   Creatinine, Ser 1.77 (H) 0.61 - 1.24 mg/dL   Calcium 9.1 8.9 - 10.3 mg/dL   Total Protein 7.5 6.5 - 8.1 g/dL   Albumin 4.3 3.5 - 5.0 g/dL   AST 19 15 - 41 U/L   ALT 17 0 - 44 U/L   Alkaline Phosphatase 55 38 - 126 U/L   Total Bilirubin 0.9 0.3 - 1.2 mg/dL   GFR, Estimated 34 (L) >60 mL/min   Anion gap 10 5 - 15  Troponin I (High Sensitivity)     Status: Abnormal   Collection Time: 02/26/20  5:42 AM  Result Value Ref Range   Troponin I (High Sensitivity) 22 (H) <18 ng/L  Respiratory Panel by RT PCR (Flu A&B, Covid) - Nasopharyngeal Swab     Status: None   Collection Time: 02/26/20  5:42 AM   Specimen: Nasopharyngeal Swab  Result Value Ref Range   SARS Coronavirus 2 by RT PCR NEGATIVE NEGATIVE   Influenza A by PCR NEGATIVE NEGATIVE   Influenza B by PCR  NEGATIVE NEGATIVE  Troponin I (High Sensitivity)     Status: Abnormal   Collection Time: 02/26/20  9:15 AM  Result Value Ref Range   Troponin I (High Sensitivity) 21 (H) <18 ng/L  Digoxin level     Status: None  Collection Time: 02/26/20  9:15 AM  Result Value Ref Range   Digoxin Level 1.1 1.0 - 2.0 ng/mL  Urinalysis, Complete w Microscopic Urine, Clean Catch     Status: Abnormal   Collection Time: 02/26/20 12:50 PM  Result Value Ref Range   Color, Urine YELLOW (A) YELLOW   APPearance CLEAR (A) CLEAR   Specific Gravity, Urine 1.011 1.005 - 1.030   pH 5.0 5.0 - 8.0   Glucose, UA NEGATIVE NEGATIVE mg/dL   Hgb urine dipstick NEGATIVE NEGATIVE   Bilirubin Urine NEGATIVE NEGATIVE   Ketones, ur NEGATIVE NEGATIVE mg/dL   Protein, ur NEGATIVE NEGATIVE mg/dL   Nitrite NEGATIVE NEGATIVE   Leukocytes,Ua NEGATIVE NEGATIVE   RBC / HPF 0-5 0 - 5 RBC/hpf   WBC, UA 0-5 0 - 5 WBC/hpf   Bacteria, UA NONE SEEN NONE SEEN   Squamous Epithelial / LPF 0-5 0 - 5   ____________________________________________  EKG My review and personal interpretation at Time: 5:40   Indication: weakness  Rate: 70  Rhythm: sinus Axis right Other: nonspecific st abn, poor r wave progression, no stemi ____________________________________________  RADIOLOGY  I personally reviewed all radiographic images ordered to evaluate for the above acute complaints and reviewed radiology reports and findings.  These findings were personally discussed with the patient.  Please see medical record for radiology report.  ____________________________________________   PROCEDURES  Procedure(s) performed:  Procedures    Critical Care performed: no ____________________________________________   INITIAL IMPRESSION / ASSESSMENT AND PLAN / ED COURSE  Pertinent labs & imaging results that were available during my care of the patient were reviewed by me and considered in my medical decision making (see chart for details).     DDX: cva, tia, hypoglycemia, dehydration, electrolyte abnormality, dissection, sepsis   Andres Gutierrez is a 86 y.o. who presents to the ED with presentation as described above.  Somewhat of an odd presentation.  Possible TIA but with very strange distribution symptoms.  CT head ordered for above differential does not show any evidence of acute intracranial abnormality.  Blood work otherwise seems to be at baseline.  Given his history age and risk factors I discussed case in consultation with neurology, Dr. Doy Mince who did recommend MRI and MRA.  MRI showed evidence of possible subacute IPH as well as amyloid angiopathy.  Not certain that this would explain the patient's symptoms but do feel this warrants observation admission the hospital for further work-up and repeat imaging.  Will discuss with hospitalist for admission.     The patient was evaluated in Emergency Department today for the symptoms described in the history of present illness. He/she was evaluated in the context of the global COVID-19 pandemic, which necessitated consideration that the patient might be at risk for infection with the SARS-CoV-2 virus that causes COVID-19. Institutional protocols and algorithms that pertain to the evaluation of patients at risk for COVID-19 are in a state of rapid change based on information released by regulatory bodies including the CDC and federal and state organizations. These policies and algorithms were followed during the patient's care in the ED.  As part of my medical decision making, I reviewed the following data within the [place] notes reviewed and incorporated, Labs reviewed, notes from prior ED visits and [place] Controlled Substance Database   ____________________________________________   FINAL CLINICAL IMPRESSION(S) / ED DIAGNOSES  Final diagnoses:  Weakness      NEW MEDICATIONS STARTED DURING THIS VISIT:  New Prescriptions  No medications on file      Note:  This document was prepared using Dragon voice recognition software and may include unintentional dictation errors.    Merlyn Lot, MD 02/26/20 1400

## 2020-02-26 NOTE — Consult Note (Signed)
Requesting Physician: Agbata    Chief Complaint: Loss of taste  I have been asked by Dr. Francine Graven to see this patient in consultation for [place].  HPI: Andres Gutierrez is an 86 y.o. male with medical history significant for chronic anemia and thrombocytopenia, stage III chronic kidney disease, CHF and anxiety who was brought into the emergency room by his daughter for evaluation of multiple symptoms which include transient loss of taste which has improved but not back to baseline, generalized weakness and a headache.  Patient states that he was in his usual state of health until about 1 PM the day prior to his admission when his daughter brought him a sandwich to eat for lunch and he was unable to taste his food.  He also uses of tobacco and stated that he was unable to taste that as well.   Patient presented for evaluation.  Initial NIHSS of 0.  Date last known well: Date: 02/25/2020 Time last known well: Time: 13:00 tPA Given: No: ICH, outside time window  ICH Score: 1      Past Medical History:  Diagnosis Date  . Anemia   . Anxiety   . Cardiomyopathy ([place])   . CHF (congestive heart failure) ([place])   . Chronic kidney disease    kidney stones  . Coronary artery disease   . Cough   . Dyspnea   . Hypertension   . Lymphadenopathy, hilar   . MI (myocardial infarction) ([place])    x 2  . Wheezing     Past Surgical History:  Procedure Laterality Date  . BRONCHIAL NEEDLE ASPIRATION BIOPSY N/A 12/31/2014   Procedure: BRONCHIAL NEEDLE ASPIRATION BIOPSIES from [place]ina;  Surgeon: Flora Lipps, MD;  Location: [facility] ORS;  Service: Cardiopulmonary;  Laterality: N/A;  . CARDIAC CATHETERIZATION N/A 12/28/2015   Procedure: Left Heart Cath and Cors/Grafts Angiography;  Surgeon: Yolonda Kida, MD;  Location: [facility];  Service: Cardiovascular;  Laterality: N/A;  . CATARACT EXTRACTION W/PHACO Left 08/01/2016   Procedure: CATARACT EXTRACTION PHACO AND INTRAOCULAR LENS PLACEMENT ([place]) Left;   Surgeon: Leandrew Koyanagi, MD;  Location: [facility];  Service: Ophthalmology;  Laterality: Left;  . COLON SURGERY    . CORONARY ANGIOPLASTY WITH STENT PLACEMENT    . CORONARY ARTERY BYPASS GRAFT    . ENDOBRONCHIAL ULTRASOUND N/A 12/31/2014   Procedure: ENDOBRONCHIAL ULTRASOUND;  Surgeon: Flora Lipps, MD;  Location: [facility];  Service: Cardiopulmonary;  Laterality: N/A;  . ESOPHAGOGASTRODUODENOSCOPY (EGD) WITH PROPOFOL N/A 06/20/2015   Procedure: ESOPHAGOGASTRODUODENOSCOPY (EGD) WITH PROPOFOL;  Surgeon: Lollie Sails, MD;  Location[facility] ENDOSCOPY;  Service: Endoscopy;  Laterality: N/A;  . IR CATHETER TUBE CHANGE  10/26/2019  . IR CHOLANGIOGRAM EXISTING TUBE  08/13/2019    Family History  Problem Relation Age of Onset  . CAD Mother   . Colon cancer Father    Social History:  reports that he quit smoking about 42 years ago. His smokeless tobacco use includes chew. He reports current alcohol use. He reports that he does not use drugs.  Allergies:  Allergies  Allergen Reactions  . Levaquin [Levofloxacin]     [name], her daughter, said Levaquin should be avoided because when patient had Levaquin a few years ago, he had "a bad reaction".  She said he could not walk and could not lift up his feet.  . 5ht3 Receptor Antagonists   . Diphenhydramine Hcl Other (See Comments)    Reaction:  Rash and fever a long time ago, but  has had it since with no problem.  . Escitalopram Other (See Comments)    Reaction:  Makes him feel faint, like he was going to have a heart attack.  . Maxidex [Dexamethasone] Other (See Comments)    Reaction:  Unknown   . Prednisone Other (See Comments)    Pt states that this medication made him feel crazy.    . Serotonin Other (See Comments)    Tried 2 different types, lexapro and another one.  Reaction:  Made him feel like he was having a heart attack.   . Vytorin [Ezetimibe-Simvastatin] Other (See Comments)    Reaction:  Unknown   . Zocor [Simvastatin]  Other (See Comments)    Reaction:  Unknown   . Diltiazem Rash    Medications: I have reviewed the patient's current medications. Prior to Admission medications   Medication Sig Start Date End Date Taking? Authorizing Provider  Ascorbic Acid (VITAMIN C PO) Take 1 tablet by mouth daily.   Yes [provider]  aspirin 81 MG EC tablet Take 1 tablet (81 mg total) by mouth daily. RESUME 48HRS after surgery 11/06/19  Yes Sakai, Isami, DO  atorvastatin (LIPITOR) 40 MG tablet Take 1 tablet (40 mg total) by mouth daily at 6 PM. Patient taking differently: Take 40 mg by mouth daily with supper.  12/30/15  Yes Dustin Flock, MD  digoxin (LANOXIN) 0.125 MG tablet Take 0.0625 mg by mouth daily.   Yes [provider]  famotidine (PEPCID) 20 MG tablet Take 20 mg by mouth at bedtime. 02/04/19  Yes [provider]  ferrous sulfate 325 (65 FE) MG tablet Take 1 tablet (325 mg total) by mouth every other day. Patient taking differently: Take 325 mg by mouth every Monday, Wednesday, and Friday. With supper 03/04/19  Yes Verlon Au, NP  furosemide (LASIX) 40 MG tablet Take 1 tablet (40 mg total) by mouth daily. Patient taking differently: Take 20 mg by mouth daily.  06/10/19  Yes Danford, Suann Larry, MD  metoprolol tartrate (LOPRESSOR) 25 MG tablet Take 12.5 mg by mouth 2 (two) times daily.   Yes [provider]  neomycin-bacitracin-polymyxin (NEOSPORIN) ointment Apply 1 application topically every other day. Wound care (foot area)   Yes [provider]  ranolazine (RANEXA) 1000 MG SR tablet Take 500 mg by mouth 2 (two) times daily.  01/16/19  Yes [provider]  sucralfate (CARAFATE) 1 G tablet Take 1 g by mouth 2 (two) times daily.    Yes [provider]  tamsulosin (FLOMAX) 0.4 MG CAPS capsule Take 0.4 mg by mouth daily after supper.    Yes [provider]  vitamin B-12 (CYANOCOBALAMIN) 500 MCG tablet Take 500 mcg by mouth daily.   Yes  [provider]    ROS: History obtained from the patient  General ROS: negative for - chills, fatigue, fever, night sweats, weight gain or weight loss Psychological ROS: negative for - behavioral disorder, hallucinations, memory difficulties, mood swings or suicidal ideation Ophthalmic ROS: negative for - blurry vision, double vision, eye pain or loss of vision ENT ROS: negative for - epistaxis, nasal discharge, oral lesions, sore throat, tinnitus or vertigo Allergy and Immunology ROS: negative for - hives or itchy/watery eyes Hematological and Lymphatic ROS: bruising Endocrine ROS: negative for - galactorrhea, hair pattern changes, polydipsia/polyuria or temperature intolerance Respiratory ROS: negative for - cough, hemoptysis, shortness of breath or wheezing Cardiovascular ROS: negative for - chest pain, dyspnea on exertion, edema or irregular heartbeat Gastrointestinal  ROS: negative for - abdominal pain, diarrhea, hematemesis, nausea/vomiting or stool incontinence Genito-Urinary ROS: negative for - dysuria, hematuria, incontinence or urinary frequency/urgency Musculoskeletal ROS: left foot pain Neurological ROS: as noted in HPI Dermatological ROS: negative for rash and skin lesion changes  Physical Examination: Blood pressure (!) 129/53, pulse (!) 59, temperature 98.7 F (37.1 C), temperature source Oral, resp. rate (!) 23, weight 81.6 kg, SpO2 98 %.  HEENT-  Normocephalic, no lesions, without obvious abnormality.  Normal external eye and conjunctiva.  Normal TM's bilaterally.  Normal auditory canals and external ears. Normal external nose, mucus membranes and septum.  Normal pharynx. Cardiovascular- S1, S2 normal, pulses palpable throughout   Lungs- chest clear, no wheezing, rales, normal symmetric air entry Abdomen- soft, non-tender; bowel sounds normal; no masses,  no organomegaly Extremities- no edema, boot on left foot Lymph-no adenopathy palpable Musculoskeletal-no  joint tenderness, deformity or swelling Skin-warm and dry, no hyperpigmentation, vitiligo, or suspicious lesions  Neurological Examination   Mental Status: Alert, oriented, thought content appropriate.  Speech fluent without evidence of aphasia.  Able to follow 3 step commands without difficulty. Cranial Nerves: II: Visual fields grossly normal, pupils equal, round, reactive to light and accommodation III,IV, VI: ptosis not present, extra-ocular motions intact bilaterally V,VII: smile symmetric, facial light touch sensation normal bilaterally VIII: hearing normal bilaterally IX,X: gag reflex present XI: bilateral shoulder shrug XII: midline tongue extension Motor: Right : Upper extremity   5/5    Left:     Upper extremity   5/5  Lower extremity   5/5     Lower extremity   5/5 Tone and bulk:normal tone throughout; no atrophy noted Sensory: Pinprick and light touch intact throughout, bilaterally Deep Tendon Reflexes: Symmetric throughout Plantars: Right: mute   Left: mute Cerebellar: Normal finger-to-nose and normal heel-to-shin testing bilaterally Gait: not tested due to boot placement    Laboratory Studies:  Basic Metabolic Panel: Recent Labs  Lab 02/26/20 0542  NA 137  K 4.4  CL 102  CO2 25  GLUCOSE 101*  BUN 30*  CREATININE 1.77*  CALCIUM 9.1    Liver Function Tests: Recent Labs  Lab 02/26/20 0542  AST 19  ALT 17  ALKPHOS 55  BILITOT 0.9  PROT 7.5  ALBUMIN 4.3   No results for input(s): LIPASE, AMYLASE in the last 168 hours. No results for input(s): AMMONIA in the last 168 hours.  CBC: Recent Labs  Lab 02/26/20 0542  WBC 9.8  HGB 9.2*  HCT 27.5*  MCV 105.4*  PLT 121*    Cardiac Enzymes: No results for input(s): CKTOTAL, CKMB, CKMBINDEX, TROPONINI in the last 168 hours.  BNP: Invalid input(s): POCBNP  CBG: No results for input(s): GLUCAP in the last 168 hours.  Microbiology: Results for orders placed or performed during the hospital  encounter of 02/26/20  Respiratory Panel by RT PCR (Flu A&B, Covid) - Nasopharyngeal Swab     Status: None   Collection Time: 02/26/20  5:42 AM   Specimen: Nasopharyngeal Swab  Result Value Ref Range Status   SARS Coronavirus 2 by RT PCR NEGATIVE NEGATIVE Final    Comment: (NOTE) SARS-CoV-2 target nucleic acids are NOT DETECTED.  The SARS-CoV-2 RNA is generally detectable in upper respiratoy specimens during the acute phase of infection. The lowest concentration of SARS-CoV-2 viral copies this assay can detect is 131 copies/mL. A negative result does not preclude SARS-Cov-2 infection and should not be used as the sole basis for treatment or other patient management decisions. A  negative result may occur with  improper specimen collection/handling, submission of specimen other than nasopharyngeal swab, presence of viral mutation(s) within the areas targeted by this assay, and inadequate number of viral copies (<131 copies/mL). A negative result must be combined with clinical observations, patient history, and epidemiological information. The expected result is Negative.  Fact Sheet for Patients:  PinkCheek.be  Fact Sheet for Healthcare Providers:  GravelBags.it  This test is no t yet approved or cleared by the [place] FDA and  has been authorized for detection and/or diagnosis of SARS-CoV-2 by FDA under an Emergency Use Authorization (EUA). This EUA will remain  in effect (meaning this test can be used) for the duration of the COVID-19 declaration under Section 564(b)(1) of the Act, 21 U.S.C. section 360bbb-3(b)(1), unless the authorization is terminated or revoked sooner.     Influenza A by PCR NEGATIVE NEGATIVE Final   Influenza B by PCR NEGATIVE NEGATIVE Final    Comment: (NOTE) The Xpert Xpress SARS-CoV-2/FLU/RSV assay is intended as an aid in  the diagnosis of influenza from Nasopharyngeal swab specimens and   should not be used as a sole basis for treatment. Nasal washings and  aspirates are unacceptable for Xpert Xpress SARS-CoV-2/FLU/RSV  testing.  Fact Sheet for Patients: PinkCheek.be  Fact Sheet for Healthcare Providers: GravelBags.it  This test is not yet approved or cleared by the [place] FDA and  has been authorized for detection and/or diagnosis of SARS-CoV-2 by  FDA under an Emergency Use Authorization (EUA). This EUA will remain  in effect (meaning this test can be used) for the duration of the  Covid-19 declaration under Section 564(b)(1) of the Act, 21  U.S.C. section 360bbb-3(b)(1), unless the authorization is  terminated or revoked. Performed at [facility], [address] [place], [place] [address]     Coagulation Studies: No results for input(s): LABPROT, INR in the last 72 hours.  Urinalysis:  Recent Labs  Lab 02/26/20 1250  COLORURINE YELLOW*  LABSPEC 1.011  PHURINE 5.0  GLUCOSEU NEGATIVE  HGBUR NEGATIVE  BILIRUBINUR NEGATIVE  KETONESUR NEGATIVE  PROTEINUR NEGATIVE  NITRITE NEGATIVE  LEUKOCYTESUR NEGATIVE    Lipid Panel:    Component Value Date/Time   CHOL 117 12/26/2015 1356   TRIG 78 12/26/2015 1356   HDL 21 (L) 12/26/2015 1356   CHOLHDL 5.6 12/26/2015 1356   VLDL 16 12/26/2015 1356   LDLCALC 80 12/26/2015 1356    HgbA1C:  Lab Results  Component Value Date   HGBA1C 5.7 12/26/2015    Urine Drug Screen:  No results found for: LABOPIA, COCAINSCRNUR, LABBENZ, AMPHETMU, THCU, LABBARB  Alcohol Level: No results for input(s): ETH in the last 168 hours.   Imaging: DG Chest 2 View  Result Date: 02/26/2020 CLINICAL DATA:  Weakness. EXAM: CHEST - 2 VIEW COMPARISON:  Generalized weakness.  Loss of taste. FINDINGS: Heart size upper limits of normal. Changes of COPD are present. No focal airspace disease is evident. Degenerative changes are present in the thoracic spine.  Patient is status post CABG. IMPRESSION: 1. No acute cardiopulmonary disease. 2. Changes of COPD. Electronically Signed   By: San Morelle M.D.   On: 02/26/2020 06:14   CT Head Wo Contrast  Result Date: 02/26/2020 CLINICAL DATA:  Diffuse weakness and tingling which began last night. EXAM: CT HEAD WITHOUT CONTRAST TECHNIQUE: Contiguous axial images were obtained from the base of the skull through the vertex without intravenous contrast. COMPARISON:  Head CT scan 01/07/2019. FINDINGS: Brain: No evidence of  acute infarction, hemorrhage, hydrocephalus, extra-axial collection or mass lesion/mass effect. Atrophy, chronic microvascular ischemic change and remote left cerebellar lacunar infarct noted. Vascular: No hyperdense vessel or unexpected calcification. Skull: Intact.  No focal lesion. Sinuses/Orbits: Negative. Other: None. IMPRESSION: No acute abnormality. Atrophy and chronic microvascular ischemic change. Electronically Signed   By: Inge Rise M.D.   On: 02/26/2020 09:42   MR ANGIO HEAD WO CONTRAST  Result Date: 02/26/2020 CLINICAL DATA:  Neuro deficit.  Weakness and tingling.  Headache. EXAM: MRI HEAD WITHOUT CONTRAST MRA HEAD WITHOUT CONTRAST TECHNIQUE: Multiplanar, multiecho pulse sequences of the brain and surrounding structures were obtained without intravenous contrast. Angiographic images of the head were obtained using MRA technique without contrast. COMPARISON:  02/26/2020 head CT and prior. FINDINGS: Image quality is mildly degraded by motion artifact. MRI HEAD FINDINGS Brain: No diffusion-weighted signal abnormality. Left occipital SWI signal dropout with correlative intrinsically T1/FLAIR hyperintense signal (16:57). No midline shift, ventriculomegaly or extra-axial fluid collection. No mass lesion. Scattered bilateral cerebral and cerebellar remote microhemorrhages. Mild cerebral atrophy with ex vacuo dilatation. Moderate chronic microvascular ischemic changes. Vascular: Please  see MRA head. Skull and upper cervical spine: Normal marrow signal. Sinuses/Orbits: Sequela of bilateral lens replacement. Minimal maxillary sinus mucosal thickening. Trace right mastoid free fluid. Other: None. MRA HEAD FINDINGS Anterior circulation: Patent normal caliber appearance of the bilateral ICAs, anterior and middle cerebral arteries. The right posterior communicating artery may be absent. Left PCOM hypoplasia. No significant stenosis, proximal occlusion, aneurysm, or vascular malformation. Posterior circulation: Dominant left vertebral artery. Proximally patent right PICA. Patent normal caliber basilar, superior cerebellar and posterior cerebral arteries. Patent normal caliber bilateral AICA. No significant stenosis, proximal occlusion, aneurysm, or vascular malformation. Venous sinuses: No evidence of thrombosis. Anatomic variants: Discussed above. IMPRESSION: MRI head: Small focus of left occipital intraparenchymal hemorrhage, likely subacute. Sequela of cerebral amyloid angiopathy. Mild cerebral atrophy. Moderate chronic microvascular ischemic changes. MRA head: No large vessel occlusion, high-grade narrowing or aneurysm. These results were called by telephone at the time of interpretation on 02/26/2020 at 11:45 am to provider Merlyn Lot , who verbally acknowledged these results. Electronically Signed   By: Primitivo Gauze M.D.   On: 02/26/2020 11:55   MR BRAIN WO CONTRAST  Result Date: 02/26/2020 CLINICAL DATA:  Neuro deficit.  Weakness and tingling.  Headache. EXAM: MRI HEAD WITHOUT CONTRAST MRA HEAD WITHOUT CONTRAST TECHNIQUE: Multiplanar, multiecho pulse sequences of the brain and surrounding structures were obtained without intravenous contrast. Angiographic images of the head were obtained using MRA technique without contrast. COMPARISON:  02/26/2020 head CT and prior. FINDINGS: Image quality is mildly degraded by motion artifact. MRI HEAD FINDINGS Brain: No diffusion-weighted  signal abnormality. Left occipital SWI signal dropout with correlative intrinsically T1/FLAIR hyperintense signal (16:57). No midline shift, ventriculomegaly or extra-axial fluid collection. No mass lesion. Scattered bilateral cerebral and cerebellar remote microhemorrhages. Mild cerebral atrophy with ex vacuo dilatation. Moderate chronic microvascular ischemic changes. Vascular: Please see MRA head. Skull and upper cervical spine: Normal marrow signal. Sinuses/Orbits: Sequela of bilateral lens replacement. Minimal maxillary sinus mucosal thickening. Trace right mastoid free fluid. Other: None. MRA HEAD FINDINGS Anterior circulation: Patent normal caliber appearance of the bilateral ICAs, anterior and middle cerebral arteries. The right posterior communicating artery may be absent. Left PCOM hypoplasia. No significant stenosis, proximal occlusion, aneurysm, or vascular malformation. Posterior circulation: Dominant left vertebral artery. Proximally patent right PICA. Patent normal caliber basilar, superior cerebellar and posterior cerebral arteries. Patent normal caliber bilateral AICA. No significant stenosis, proximal  occlusion, aneurysm, or vascular malformation. Venous sinuses: No evidence of thrombosis. Anatomic variants: Discussed above. IMPRESSION: MRI head: Small focus of left occipital intraparenchymal hemorrhage, likely subacute. Sequela of cerebral amyloid angiopathy. Mild cerebral atrophy. Moderate chronic microvascular ischemic changes. MRA head: No large vessel occlusion, high-grade narrowing or aneurysm. These results were called by telephone at the time of interpretation on 02/26/2020 at 11:45 am to provider Merlyn Lot , who verbally acknowledged these results. Electronically Signed   By: Primitivo Gauze M.D.   On: 02/26/2020 11:55    Assessment: 86 y.o. male with medical history significant for chronic anemia and thrombocytopenia, stage III chronic kidney disease, CHF and anxiety who was  brought into the emergency room by his daughter for evaluation of multiple symptoms which include transient loss of taste which has improved but not back to baseline, generalized weakness and a headache.  Head CT reviewed and shows no acute changes.  MRI of the brain performed and reviewed and shows a small area of intraparenchymal hemorrhage in the left occipital lobe.  Evidence of amyloid angiopathy noted as well.  No evidence of aneurysm.    Stroke Risk Factors - hypertension  Plan: 1. HgbA1c, fasting lipid panel 2. Repeat head CT in AM without contrast.  Would perform CTA of the neck at that time as well.   3. PT consult, OT consult, Speech consult 4. Echocardiogram 5. Prophylactic therapy-Hold ASA 6. Telemetry monitoring 7. Frequent neuro checks 8. Continued BP control   Alexis Goodell, MD Neurology  02/26/2020, 6:08 PM

## 2020-02-26 NOTE — ED Notes (Signed)
Two unsuccessful IV attempts by another RN.

## 2020-02-27 ENCOUNTER — Observation Stay
Admit: 2020-02-27 | Discharge: 2020-02-27 | Disposition: A | Discharge status: Home / Self Care | Payer: PPO | Attending: Internal Medicine | Admitting: Internal Medicine

## 2020-02-27 ENCOUNTER — Observation Stay: Payer: PPO

## 2020-02-27 DIAGNOSIS — I639 Cerebral infarction, unspecified: Secondary | ICD-10-CM

## 2020-02-27 DIAGNOSIS — I6389 Other cerebral infarction: Secondary | ICD-10-CM | POA: Diagnosis not present

## 2020-02-27 DIAGNOSIS — I7090 Unspecified atherosclerosis: Secondary | ICD-10-CM | POA: Diagnosis not present

## 2020-02-27 DIAGNOSIS — E859 Amyloidosis, unspecified: Secondary | ICD-10-CM

## 2020-02-27 DIAGNOSIS — I619 Nontraumatic intracerebral hemorrhage, unspecified: Secondary | ICD-10-CM | POA: Diagnosis not present

## 2020-02-27 DIAGNOSIS — I6782 Cerebral ischemia: Secondary | ICD-10-CM | POA: Diagnosis not present

## 2020-02-27 DIAGNOSIS — G319 Degenerative disease of nervous system, unspecified: Secondary | ICD-10-CM | POA: Diagnosis not present

## 2020-02-27 LAB — BASIC METABOLIC PANEL
Anion gap: 9 (ref 5–15)
BUN: 23 mg/dL (ref 8–23)
CO2: 25 mmol/L (ref 22–32)
Calcium: 9.2 mg/dL (ref 8.9–10.3)
Chloride: 102 mmol/L (ref 98–111)
Creatinine, Ser: 1.51 mg/dL — ABNORMAL HIGH (ref 0.61–1.24)
GFR, Estimated: 41 mL/min — ABNORMAL LOW (ref >60)
Glucose, Bld: 117 mg/dL — ABNORMAL HIGH (ref 70–99)
Potassium: 3.8 mmol/L (ref 3.5–5.1)
Sodium: 136 mmol/L (ref 135–145)

## 2020-02-27 LAB — LIPID PANEL
Cholesterol: 79 mg/dL (ref 0–200)
HDL: 18 mg/dL — ABNORMAL LOW (ref >40)
LDL Cholesterol: 38 mg/dL (ref 0–99)
Total CHOL/HDL Ratio: 4.4 RATIO
Triglycerides: 116 mg/dL (ref <150)
VLDL: 23 mg/dL (ref 0–40)

## 2020-02-27 LAB — ECHOCARDIOGRAM COMPLETE
AR max vel: 2.49 cm2
AV Area VTI: 2.38 cm2
AV Area mean vel: 2.24 cm2
AV Mean grad: 12 mmHg
AV Peak grad: 20.3 mmHg
Ao pk vel: 2.25 m/s
Area-P 1/2: 6.54 cm2
Height: 76 in
S' Lateral: 4.51 cm
Weight: 2902.4 oz

## 2020-02-27 LAB — CBC
HCT: 27.8 % — ABNORMAL LOW (ref 39.0–52.0)
Hemoglobin: 9.3 g/dL — ABNORMAL LOW (ref 13.0–17.0)
MCH: 35.4 pg — ABNORMAL HIGH (ref 26.0–34.0)
MCHC: 33.5 g/dL (ref 30.0–36.0)
MCV: 105.7 fL — ABNORMAL HIGH (ref 80.0–100.0)
Platelets: 125 10*3/uL — ABNORMAL LOW (ref 150–400)
RBC: 2.63 MIL/uL — ABNORMAL LOW (ref 4.22–5.81)
RDW: 15.7 % — ABNORMAL HIGH (ref 11.5–15.5)
WBC: 9.6 10*3/uL (ref 4.0–10.5)
nRBC: 0 % (ref 0.0–0.2)

## 2020-02-27 LAB — MAGNESIUM: Magnesium: 1.8 mg/dL (ref 1.7–2.4)

## 2020-02-27 MED ORDER — FERROUS SULFATE 325 (65 FE) MG PO TABS
325.0000 mg | ORAL_TABLET | ORAL | Status: AC
Start: 2020-02-29 — End: ?

## 2020-02-27 MED ORDER — FUROSEMIDE 40 MG PO TABS: 20.0000 mg | ORAL_TABLET | Freq: Every day | ORAL | Status: DC

## 2020-02-27 MED ORDER — ATORVASTATIN CALCIUM 40 MG PO TABS: 40.0000 mg | ORAL_TABLET | Freq: Every day | ORAL | Status: AC

## 2020-02-27 NOTE — Progress Notes (Signed)
OT Cancellation Note  Patient Details Name: Andres Gutierrez MRN: [id number] DOB: 05-22-1930   Cancelled Treatment:    Reason Eval/Treat Not Completed: OT screened, no needs identified, will sign off. OT order received and chart reviewed. Per conversation c PT, pt is Independent c LBD and MOD I for mobility this date. No skilled OT needs identified. Will sign off. Please re-consult if additional OT needs arise.   Dessie Coma, M.S. OTR/L  02/27/20, 12:27 PM  ascom [phone]

## 2020-02-27 NOTE — Evaluation (Signed)
Physical Therapy Evaluation Patient Details Name: Andres Gutierrez MRN: [id number] DOB: [birth year] Today's Date: 02/27/2020   History of Present Illness  Pt is an 86 yo male that presented to ED for transient loss of taste (which has improved), generalized weakness, HA. PMH of anxiety, CHF, HTN, MI, chonic anemia and thrombocytopenia, CKDIII. Imaging showed "a small focus of left occipital intraparenchymal hemorrhage likely subacute."    Clinical Impression  Pt A&Ox4, no pain to report. Pt stated he lives alone (daughter lives two blocks away) modI/I for ADLs, no falls in the last 6 months. The patient does not drive, his family assists with meals/errands.   RN entered room behind PT to administer medications. Pt able to transfer to EOB modI, and take medication from RN independently. Normal seated balance noted, pt also able to doff/don socks and shoes independently. The patient performed sit <> stand modI and ambulated ~162ft with RW modI. Pt able to walk and talk to therapist, no change in gait velocity/path deviation noted with conversation or head turns. No unsteadiness. The patient demonstrated and reported return to baseline level of functioning, no further acute PT needs indicated. PT to sign off. Please reconsult PT if pt status changes or acute needs are identified.       Follow Up Recommendations No PT follow up    Equipment Recommendations  None recommended by PT    Recommendations for Other Services       Precautions / Restrictions Precautions Precautions: Fall Restrictions Weight Bearing Restrictions: No      Mobility  Bed Mobility Overal bed mobility: Modified Independent                Transfers Overall transfer level: Modified independent Equipment used: Rolling walker (2 wheeled)                Ambulation/Gait Ambulation/Gait assistance: Modified independent (Device/Increase time) Gait Distance (Feet): 125 Feet         General Gait Details: Pt  able to walk and talk to therapist, no change in gait velocity/path deviation noted with conversation or head turns. No unsteadiness.  Stairs            Wheelchair Mobility    Modified Rankin (Stroke Patients Only)       Balance Overall balance assessment: Modified Independent                                           Pertinent Vitals/Pain Pain Assessment: No/denies pain    Home Living Family/patient expects to be discharged to:: Private residence Living Arrangements: Alone Available Help at Discharge: Family;Available PRN/intermittently (daughter lives 2 blocks away) Type of Home: House Home Access: Stairs to enter Entrance Stairs-Rails: Right Entrance Stairs-Number of Steps: 2 Home Layout: One level Home Equipment: Walker - 2 wheels;Bedside commode;Shower seat;Cane - single point;Grab bars - tub/shower      Prior Function Level of Independence: Needs assistance   Gait / Transfers Assistance Needed: uses RW for mobility  ADL's / Homemaking Assistance Needed: I with (B)ADLs.  Children assist with all community tasks. Pt reported he does not drive, family assists with cooking/cleaning as needed        Hand Dominance   Dominant Hand: Right    Extremity/Trunk Assessment   Upper Extremity Assessment Upper Extremity Assessment: Overall WFL for tasks assessed (no sensation or coordination deficits noted)    Lower  Extremity Assessment Lower Extremity Assessment: Overall WFL for tasks assessed (WFLs. only weakness noted with R ankle dorsiflexion, 3+/5. Reported it has been a chronic issue for 4 years. no coordination or sensation deficits noted)    Cervical / Trunk Assessment Cervical / Trunk Assessment: Normal  Communication   Communication: HOH  Cognition Arousal/Alertness: Awake/alert Behavior During Therapy: WFL for tasks assessed/performed Overall Cognitive Status: Within Functional Limits for tasks assessed                                         General Comments      Exercises Other Exercises Other Exercises: Pt able to doff and then don socks from home, as well as post op shoe on L foot, and sneaker on R foot. Other Exercises: Pt sat EOB for several minutes for medication administration from RN in room. Able to take independently   Assessment/Plan    PT Assessment Patent does not need any further PT services  PT Problem List         PT Treatment Interventions      PT Goals (Current goals can be found in the Care Plan section)       Frequency     Barriers to discharge        Co-evaluation               AM-PAC PT "6 Clicks" Mobility  Outcome Measure Help needed turning from your back to your side while in a flat bed without using bedrails?: None Help needed moving from lying on your back to sitting on the side of a flat bed without using bedrails?: None Help needed moving to and from a bed to a chair (including a wheelchair)?: None Help needed standing up from a chair using your arms (e.g., wheelchair or bedside chair)?: None Help needed to walk in hospital room?: None Help needed climbing 3-5 steps with a railing? : None 6 Click Score: 24    End of Session Equipment Utilized During Treatment: Gait belt Activity Tolerance: Patient tolerated treatment well Patient left: in chair;with call bell/phone within reach;with chair alarm set Nurse Communication: Mobility status PT Visit Diagnosis: Other abnormalities of gait and mobility (R26.89)    Time: 7824-2353 PT Time Calculation (min) (ACUTE ONLY): 26 min   Charges:   PT Evaluation $PT Eval Low Complexity: 1 Low PT Treatments $Therapeutic Exercise: 8-22 mins        Lieutenant Diego PT, DPT 10:00 AM,02/27/20

## 2020-02-27 NOTE — Discharge Summary (Signed)
Physician Discharge Summary   Andres Gutierrez  male DOB: 10/27/1930  UTM:[id number]  PCP: Rusty Aus, MD  Admit date: 02/26/2020 Discharge date: 02/27/2020  Admitted From: home Disposition:  home Home Health: Yes CODE STATUS: Full code   Hospital Course:  For full details, please see H&P, progress notes, consult notes and ancillary notes.  Briefly,  Andres Gutierrez is a 86 y.o. male with medical history significant for chronic anemia and thrombocytopenia, stage III chronic kidney disease, CHF and anxiety who was brought into the emergency room by his daughter for evaluation of multiple symptoms which include transient loss of taste which has improved but not back to baseline, generalized weakness and a headache.  CVA ruled out Amyloid angiopathy Head CT showed no acute changes.  MRI of the brain showed a small area of intraparenchymal hemorrhage in the left occipital lobe.  Evidence of amyloid angiopathy noted as well.  No evidence of aneurysm.  repeat head CT the next day showed no change.  Neurology was consulted who had explained to pt and daughter that pt's symptoms were due to amyloid which is progressive and not curable.  PT evaluated and cleared pt for discharge.  HHPT ordered per neuro and family requests.  Stage III chronic kidney disease Stable  Chronic anemia/thrombocytopenia Stable.  Continued ascorbic acid and iron supplements  Chronic systolic heart failure Last known LVEF 25 to 30% Continued furosemide and metoprolol.  Patient not on an ACE inhibitor or ARB due to renal insufficiency  History of A. Fib Continued digoxin for rate control.  Patient not on long-term anticoagulation therapy due to thrombocytopenia with increased risk for bleeding.  Ischemic cardiomyopathy Last known LVEF 25 to 30%.  Continued metoprolol, statins and Ranexa  BPH Continued Flomax   Discharge Diagnoses:  Principal Problem:   CVA (cerebral vascular accident) ([place]) Active  Problems:   HTN (hypertension)   Chronic atrial fibrillation (HCC)   GERD (gastroesophageal reflux disease)   Chronic systolic heart failure (HCC)   BPH (benign prostatic hyperplasia)   Stage 3 chronic kidney disease (HCC)   Thrombocytopenia (HCC)   Cardiomyopathy, ischemic    Discharge Instructions:  Allergies as of 02/27/2020      Reactions   Levaquin [levofloxacin]    [name], her daughter, said Levaquin should be avoided because when patient had Levaquin a few years ago, he had "a bad reaction".  She said he could not walk and could not lift up his feet.   5ht3 Receptor Antagonists    Diphenhydramine Hcl Other (See Comments)   Reaction:  Rash and fever a long time ago, but has had it since with no problem.   Escitalopram Other (See Comments)   Reaction:  Makes him feel faint, like he was going to have a heart attack.   Maxidex [dexamethasone] Other (See Comments)   Reaction:  Unknown    Prednisone Other (See Comments)   Pt states that this medication made him feel crazy.     Serotonin Other (See Comments)   Tried 2 different types, lexapro and another one.  Reaction:  Made him feel like he was having a heart attack.    Vytorin [ezetimibe-simvastatin] Other (See Comments)   Reaction:  Unknown    Zocor [simvastatin] Other (See Comments)   Reaction:  Unknown    Diltiazem Rash      Medication List    TAKE these medications   aspirin 81 MG EC tablet Take 1 tablet (81 mg total) by mouth  daily. RESUME 48HRS after surgery   atorvastatin 40 MG tablet Commonly known as: LIPITOR Take 1 tablet (40 mg total) by mouth daily with supper.   digoxin 0.125 MG tablet Commonly known as: LANOXIN Take 0.0625 mg by mouth daily.   famotidine 20 MG tablet Commonly known as: PEPCID Take 20 mg by mouth at bedtime.   ferrous sulfate 325 (65 FE) MG tablet Take 1 tablet (325 mg total) by mouth every Monday, Wednesday, and Friday. With supper Start taking on: February 29, 2020     furosemide 40 MG tablet Commonly known as: LASIX Take 0.5 tablets (20 mg total) by mouth daily.   metoprolol tartrate 25 MG tablet Commonly known as: LOPRESSOR Take 12.5 mg by mouth 2 (two) times daily.   neomycin-bacitracin-polymyxin ointment Commonly known as: NEOSPORIN Apply 1 application topically every other day. Wound care (foot area)   ranolazine 1000 MG SR tablet Commonly known as: RANEXA Take 500 mg by mouth 2 (two) times daily.   sucralfate 1 g tablet Commonly known as: CARAFATE Take 1 g by mouth 2 (two) times daily.   tamsulosin 0.4 MG Caps capsule Commonly known as: FLOMAX Take 0.4 mg by mouth daily after supper.   vitamin B-12 500 MCG tablet Commonly known as: CYANOCOBALAMIN Take 500 mcg by mouth daily.   VITAMIN C PO Take 1 tablet by mouth daily.        Follow-up Information    Rusty Aus, MD. Schedule an appointment as soon as possible for a visit in 1 week(s).   Specialty: Internal Medicine Contact information: [place] [address] [phone]               Allergies  Allergen Reactions  . Levaquin [Levofloxacin]     [name], her daughter, said Levaquin should be avoided because when patient had Levaquin a few years ago, he had "a bad reaction".  She said he could not walk and could not lift up his feet.  . 5ht3 Receptor Antagonists   . Diphenhydramine Hcl Other (See Comments)    Reaction:  Rash and fever a long time ago, but has had it since with no problem.  . Escitalopram Other (See Comments)    Reaction:  Makes him feel faint, like he was going to have a heart attack.  . Maxidex [Dexamethasone] Other (See Comments)    Reaction:  Unknown   . Prednisone Other (See Comments)    Pt states that this medication made him feel crazy.    . Serotonin Other (See Comments)    Tried 2 different types, lexapro and another one.  Reaction:  Made him feel like he was having a heart  attack.   . Vytorin [Ezetimibe-Simvastatin] Other (See Comments)    Reaction:  Unknown   . Zocor [Simvastatin] Other (See Comments)    Reaction:  Unknown   . Diltiazem Rash     The results of significant diagnostics from this hospitalization (including imaging, microbiology, ancillary and laboratory) are listed below for reference.   Consultations:   Procedures/Studies: DG Chest 2 View  Result Date: 02/26/2020 CLINICAL DATA:  Weakness. EXAM: CHEST - 2 VIEW COMPARISON:  Generalized weakness.  Loss of taste. FINDINGS: Heart size upper limits of normal. Changes of COPD are present. No focal airspace disease is evident. Degenerative changes are present in the thoracic spine. Patient is status post CABG. IMPRESSION: 1. No acute cardiopulmonary disease. 2. Changes of COPD. Electronically Signed   By:  San Morelle M.D.   On: 02/26/2020 06:14   CT HEAD WO CONTRAST  Result Date: 02/27/2020 CLINICAL DATA:  Parenchymal hemorrhage, follow-up. EXAM: CT HEAD WITHOUT CONTRAST TECHNIQUE: Contiguous axial images were obtained from the base of the skull through the vertex without intravenous contrast. COMPARISON:  02/26/2020 head CT and MRI head. 01/07/2019 head CT and prior. FINDINGS: Brain: Small focus of left occipital hemorrhage seen on prior MRI correlates with minimal hyperdensities on CT (2: 12). No new or enlarging intracranial hemorrhage. No acute infarct. No mass lesion. No midline shift, ventriculomegaly or extra-axial fluid collection. Mild cerebral atrophy with ex vacuo dilatation. Chronic microvascular ischemic changes. Vascular: No hyperdense vessel or unexpected calcification. Skull base atherosclerotic calcifications. Skull: Negative for fracture or focal lesion. Sinuses/Orbits: Normal orbits. Clear paranasal sinuses. No mastoid effusion. Other: None. IMPRESSION: Minimal left occipital hemorrhage, unchanged. No new or enlarging intracranial hemorrhage. Age-related cerebral atrophy and  chronic microvascular ischemic changes. Electronically Signed   By: Primitivo Gauze M.D.   On: 02/27/2020 10:35   CT Head Wo Contrast  Result Date: 02/26/2020 CLINICAL DATA:  Diffuse weakness and tingling which began last night. EXAM: CT HEAD WITHOUT CONTRAST TECHNIQUE: Contiguous axial images were obtained from the base of the skull through the vertex without intravenous contrast. COMPARISON:  Head CT scan 01/07/2019. FINDINGS: Brain: No evidence of acute infarction, hemorrhage, hydrocephalus, extra-axial collection or mass lesion/mass effect. Atrophy, chronic microvascular ischemic change and remote left cerebellar lacunar infarct noted. Vascular: No hyperdense vessel or unexpected calcification. Skull: Intact.  No focal lesion. Sinuses/Orbits: Negative. Other: None. IMPRESSION: No acute abnormality. Atrophy and chronic microvascular ischemic change. Electronically Signed   By: Inge Rise M.D.   On: 02/26/2020 09:42   MR ANGIO HEAD WO CONTRAST  Result Date: 02/26/2020 CLINICAL DATA:  Neuro deficit.  Weakness and tingling.  Headache. EXAM: MRI HEAD WITHOUT CONTRAST MRA HEAD WITHOUT CONTRAST TECHNIQUE: Multiplanar, multiecho pulse sequences of the brain and surrounding structures were obtained without intravenous contrast. Angiographic images of the head were obtained using MRA technique without contrast. COMPARISON:  02/26/2020 head CT and prior. FINDINGS: Image quality is mildly degraded by motion artifact. MRI HEAD FINDINGS Brain: No diffusion-weighted signal abnormality. Left occipital SWI signal dropout with correlative intrinsically T1/FLAIR hyperintense signal (16:57). No midline shift, ventriculomegaly or extra-axial fluid collection. No mass lesion. Scattered bilateral cerebral and cerebellar remote microhemorrhages. Mild cerebral atrophy with ex vacuo dilatation. Moderate chronic microvascular ischemic changes. Vascular: Please see MRA head. Skull and upper cervical spine: Normal marrow  signal. Sinuses/Orbits: Sequela of bilateral lens replacement. Minimal maxillary sinus mucosal thickening. Trace right mastoid free fluid. Other: None. MRA HEAD FINDINGS Anterior circulation: Patent normal caliber appearance of the bilateral ICAs, anterior and middle cerebral arteries. The right posterior communicating artery may be absent. Left PCOM hypoplasia. No significant stenosis, proximal occlusion, aneurysm, or vascular malformation. Posterior circulation: Dominant left vertebral artery. Proximally patent right PICA. Patent normal caliber basilar, superior cerebellar and posterior cerebral arteries. Patent normal caliber bilateral AICA. No significant stenosis, proximal occlusion, aneurysm, or vascular malformation. Venous sinuses: No evidence of thrombosis. Anatomic variants: Discussed above. IMPRESSION: MRI head: Small focus of left occipital intraparenchymal hemorrhage, likely subacute. Sequela of cerebral amyloid angiopathy. Mild cerebral atrophy. Moderate chronic microvascular ischemic changes. MRA head: No large vessel occlusion, high-grade narrowing or aneurysm. These results were called by telephone at the time of interpretation on 02/26/2020 at 11:45 am to provider Merlyn Lot , who verbally acknowledged these results. Electronically Signed   By: Georga Kaufmann  [place] M.D.   On: 02/26/2020 11:55   MR BRAIN WO CONTRAST  Result Date: 02/26/2020 CLINICAL DATA:  Neuro deficit.  Weakness and tingling.  Headache. EXAM: MRI HEAD WITHOUT CONTRAST MRA HEAD WITHOUT CONTRAST TECHNIQUE: Multiplanar, multiecho pulse sequences of the brain and surrounding structures were obtained without intravenous contrast. Angiographic images of the head were obtained using MRA technique without contrast. COMPARISON:  02/26/2020 head CT and prior. FINDINGS: Image quality is mildly degraded by motion artifact. MRI HEAD FINDINGS Brain: No diffusion-weighted signal abnormality. Left occipital SWI signal dropout with  correlative intrinsically T1/FLAIR hyperintense signal (16:57). No midline shift, ventriculomegaly or extra-axial fluid collection. No mass lesion. Scattered bilateral cerebral and cerebellar remote microhemorrhages. Mild cerebral atrophy with ex vacuo dilatation. Moderate chronic microvascular ischemic changes. Vascular: Please see MRA head. Skull and upper cervical spine: Normal marrow signal. Sinuses/Orbits: Sequela of bilateral lens replacement. Minimal maxillary sinus mucosal thickening. Trace right mastoid free fluid. Other: None. MRA HEAD FINDINGS Anterior circulation: Patent normal caliber appearance of the bilateral ICAs, anterior and middle cerebral arteries. The right posterior communicating artery may be absent. Left PCOM hypoplasia. No significant stenosis, proximal occlusion, aneurysm, or vascular malformation. Posterior circulation: Dominant left vertebral artery. Proximally patent right PICA. Patent normal caliber basilar, superior cerebellar and posterior cerebral arteries. Patent normal caliber bilateral AICA. No significant stenosis, proximal occlusion, aneurysm, or vascular malformation. Venous sinuses: No evidence of thrombosis. Anatomic variants: Discussed above. IMPRESSION: MRI head: Small focus of left occipital intraparenchymal hemorrhage, likely subacute. Sequela of cerebral amyloid angiopathy. Mild cerebral atrophy. Moderate chronic microvascular ischemic changes. MRA head: No large vessel occlusion, high-grade narrowing or aneurysm. These results were called by telephone at the time of interpretation on 02/26/2020 at 11:45 am to provider Merlyn Lot , who verbally acknowledged these results. Electronically Signed   By: Primitivo Gauze M.D.   On: 02/26/2020 11:55   ECHOCARDIOGRAM COMPLETE  Result Date: 02/27/2020    ECHOCARDIOGRAM REPORT   Patient Name:   Andres Gutierrez Date of Exam: 02/27/2020 Medical Rec #:  [id number]    Height:       76.0 in Accession #:    [id number]    Weight:       181.4 lb Date of Birth:  January 24, 1931    BSA:          2.125 m Patient Age:    8 years     BP:           119/65 mmHg Patient Gender: M            HR:           69 bpm. Exam Location:  [facility] Procedure: 2D Echo Indications:     Stroke 434.91 / I163.9  History:         Patient has prior history of Echocardiogram examinations, most                  recent 07/30/2019. Previous Myocardial Infarction,                  Arrythmias:Atrial Fibrillation, Signs/Symptoms:Dyspnea; Risk                  Factors:Hypertension.  Sonographer:     Avanell Shackleton Referring Phys:  [id number] AGBATA Diagnosing Phys: Bartholome Bill MD IMPRESSIONS  1. Left ventricular ejection fraction, by estimation, is 35 to 40%. Left ventricular ejection fraction by PLAX is 40 %. The left ventricle has mild to moderately decreased function. The left ventricle  demonstrates regional wall motion abnormalities (see  scoring diagram/findings for description). The left ventricular internal cavity size was mildly dilated. Left ventricular diastolic parameters are consistent with Grade I diastolic dysfunction (impaired relaxation).  2. Right ventricular systolic function is normal. The right ventricular size is normal.  3. Left atrial size was mildly dilated.  4. Right atrial size was mildly dilated.  5. The mitral valve is grossly normal. Mild mitral valve regurgitation.  6. The aortic valve was not well visualized. Aortic valve regurgitation is mild. Mild aortic valve stenosis. FINDINGS  Left Ventricle: Left ventricular ejection fraction, by estimation, is 35 to 40%. Left ventricular ejection fraction by PLAX is 40 %. The left ventricle has mild to moderately decreased function. The left ventricle demonstrates regional wall motion abnormalities. The left ventricular internal cavity size was mildly dilated. There is borderline left ventricular hypertrophy. Left ventricular diastolic parameters are consistent with Grade I diastolic dysfunction  (impaired relaxation).  LV Wall Scoring: The mid and distal anterior septum is akinetic. The antero-lateral wall, basal anteroseptal segment, apical lateral segment, mid inferoseptal segment, basal inferoseptal segment, and apex are hypokinetic. The posterior wall is normal. Right Ventricle: The right ventricular size is normal. No increase in right ventricular wall thickness. Right ventricular systolic function is normal. Left Atrium: Left atrial size was mildly dilated. Right Atrium: Right atrial size was mildly dilated. Pericardium: There is no evidence of pericardial effusion. Mitral Valve: The mitral valve is grossly normal. Mild mitral valve regurgitation. Tricuspid Valve: The tricuspid valve is not well visualized. Tricuspid valve regurgitation is trivial. Aortic Valve: The aortic valve was not well visualized. Aortic valve regurgitation is mild. Mild aortic stenosis is present. Aortic valve mean gradient measures 12.0 mmHg. Aortic valve peak gradient measures 20.2 mmHg. Aortic valve area, by VTI measures 2.38 cm. Pulmonic Valve: The pulmonic valve was not well visualized. Pulmonic valve regurgitation is trivial. Aorta: The aortic root was not well visualized. IAS/Shunts: The interatrial septum was not assessed.  LEFT VENTRICLE PLAX 2D LV EF:         Left            Diastology                ventricular     LV e' lateral:   6.74 cm/s                ejection        LV E/e' lateral: 14.5                fraction by                PLAX is 40                %. LVIDd:         5.62 cm LVIDs:         4.51 cm LV PW:         1.35 cm LV IVS:        0.85 cm LVOT diam:     2.40 cm LV SV:         111 LV SV Index:   52 LVOT Area:     4.52 cm  RIGHT VENTRICLE         IVC TAPSE (M-mode): 1.8 cm  IVC diam: 1.64 cm LEFT ATRIUM              Index       RIGHT ATRIUM  Index LA diam:        4.80 cm  2.26 cm/m  RA Area:     20.60 cm LA Vol (A2C):   111.0 ml 52.23 ml/m RA Volume:   57.70 ml  27.15 ml/m LA Vol (A4C):    97.6 ml  45.92 ml/m LA Biplane Vol: 104.0 ml 48.94 ml/m  AORTIC VALVE AV Area (Vmax):    2.49 cm AV Area (Vmean):   2.24 cm AV Area (VTI):     2.38 cm AV Vmax:           225.00 cm/s AV Vmean:          166.000 cm/s AV VTI:            0.466 m AV Peak Grad:      20.2 mmHg AV Mean Grad:      12.0 mmHg LVOT Vmax:         124.00 cm/s LVOT Vmean:        82.100 cm/s LVOT VTI:          0.245 m LVOT/AV VTI ratio: 0.53  AORTA Ao Root diam: 3.70 cm MITRAL VALVE               TRICUSPID VALVE MV Area (PHT): 6.54 cm    TR Peak grad:   22.3 mmHg MV Decel Time: 116 msec    TR Vmax:        236.00 cm/s MV E velocity: 97.40 cm/s MV A velocity: 94.60 cm/s  SHUNTS MV E/A ratio:  1.03        Systemic VTI:  0.24 m                            Systemic Diam: 2.40 cm Bartholome Bill MD Electronically signed by Bartholome Bill MD Signature Date/Time: 02/27/2020/10:54:27 AM    Final       Labs: BNP (last 3 results) Recent Labs    06/07/19 1453 07/30/19 0224  BNP 961.0* 3,009.2*   Basic Metabolic Panel: Recent Labs  Lab 02/26/20 0542  NA 137  K 4.4  CL 102  CO2 25  GLUCOSE 101*  BUN 30*  CREATININE 1.77*  CALCIUM 9.1   Liver Function Tests: Recent Labs  Lab 02/26/20 0542  AST 19  ALT 17  ALKPHOS 55  BILITOT 0.9  PROT 7.5  ALBUMIN 4.3   No results for input(s): LIPASE, AMYLASE in the last 168 hours. No results for input(s): AMMONIA in the last 168 hours. CBC: Recent Labs  Lab 02/26/20 0542  WBC 9.8  HGB 9.2*  HCT 27.5*  MCV 105.4*  PLT 121*   Cardiac Enzymes: No results for input(s): CKTOTAL, CKMB, CKMBINDEX, TROPONINI in the last 168 hours. BNP: Invalid input(s): POCBNP CBG: No results for input(s): GLUCAP in the last 168 hours. D-Dimer No results for input(s): DDIMER in the last 72 hours. Hgb A1c No results for input(s): HGBA1C in the last 72 hours. Lipid Profile Recent Labs    02/27/20 0448  CHOL 79  HDL 18*  LDLCALC 38  TRIG 116  CHOLHDL 4.4   Thyroid function studies No  results for input(s): TSH, T4TOTAL, T3FREE, THYROIDAB in the last 72 hours.  Invalid input(s): FREET3 Anemia work up No results for input(s): VITAMINB12, FOLATE, FERRITIN, TIBC, IRON, RETICCTPCT in the last 72 hours. Urinalysis    Component Value Date/Time   COLORURINE YELLOW (A) 02/26/2020 1250   APPEARANCEUR CLEAR (A) 02/26/2020 1250  LABSPEC 1.011 02/26/2020 1250   PHURINE 5.0 02/26/2020 1250   GLUCOSEU NEGATIVE 02/26/2020 1250   HGBUR NEGATIVE 02/26/2020 1250   BILIRUBINUR NEGATIVE 02/26/2020 1250   KETONESUR NEGATIVE 02/26/2020 1250   PROTEINUR NEGATIVE 02/26/2020 1250   UROBILINOGEN 0.2 09/11/2010 0647   NITRITE NEGATIVE 02/26/2020 1250   LEUKOCYTESUR NEGATIVE 02/26/2020 1250   Sepsis Labs Invalid input(s): PROCALCITONIN,  WBC,  LACTICIDVEN Microbiology Recent Results (from the past 240 hour(s))  Respiratory Panel by RT PCR (Flu A&B, Covid) - Nasopharyngeal Swab     Status: None   Collection Time: 02/26/20  5:42 AM   Specimen: Nasopharyngeal Swab  Result Value Ref Range Status   SARS Coronavirus 2 by RT PCR NEGATIVE NEGATIVE Final    Comment: (NOTE) SARS-CoV-2 target nucleic acids are NOT DETECTED.  The SARS-CoV-2 RNA is generally detectable in upper respiratoy specimens during the acute phase of infection. The lowest concentration of SARS-CoV-2 viral copies this assay can detect is 131 copies/mL. A negative result does not preclude SARS-Cov-2 infection and should not be used as the sole basis for treatment or other patient management decisions. A negative result may occur with  improper specimen collection/handling, submission of specimen other than nasopharyngeal swab, presence of viral mutation(s) within the areas targeted by this assay, and inadequate number of viral copies (<131 copies/mL). A negative result must be combined with clinical observations, patient history, and epidemiological information. The expected result is Negative.  Fact Sheet for Patients:   PinkCheek.be  Fact Sheet for Healthcare Providers:  GravelBags.it  This test is no t yet approved or cleared by the [place] FDA and  has been authorized for detection and/or diagnosis of SARS-CoV-2 by FDA under an Emergency Use Authorization (EUA). This EUA will remain  in effect (meaning this test can be used) for the duration of the COVID-19 declaration under Section 564(b)(1) of the Act, 21 U.S.C. section 360bbb-3(b)(1), unless the authorization is terminated or revoked sooner.     Influenza A by PCR NEGATIVE NEGATIVE Final   Influenza B by PCR NEGATIVE NEGATIVE Final    Comment: (NOTE) The Xpert Xpress SARS-CoV-2/FLU/RSV assay is intended as an aid in  the diagnosis of influenza from Nasopharyngeal swab specimens and  should not be used as a sole basis for treatment. Nasal washings and  aspirates are unacceptable for Xpert Xpress SARS-CoV-2/FLU/RSV  testing.  Fact Sheet for Patients: PinkCheek.be  Fact Sheet for Healthcare Providers: GravelBags.it  This test is not yet approved or cleared by the [place] FDA and  has been authorized for detection and/or diagnosis of SARS-CoV-2 by  FDA under an Emergency Use Authorization (EUA). This EUA will remain  in effect (meaning this test can be used) for the duration of the  Covid-19 declaration under Section 564(b)(1) of the Act, 21  U.S.C. section 360bbb-3(b)(1), unless the authorization is  terminated or revoked. Performed at [facility], [address] [place], [place] [address]      Total time spend on discharging this patient, including the last patient exam, discussing the hospital stay, instructions for ongoing care as it relates to all pertinent caregivers, as well as preparing the medical discharge records, prescriptions, and/or referrals as applicable, is 40 minutes.    Enzo Bi,  MD  [facility] 02/27/2020, 12:40 PM  If 7PM-7AM, please contact night-coverage

## 2020-02-27 NOTE — Consult Note (Signed)
Subjective:  No new complaints this AM.     Past Medical History:  Diagnosis Date  . Anemia   . Anxiety   . Cardiomyopathy ([place])   . CHF (congestive heart failure) ([place])   . Chronic kidney disease    kidney stones  . Coronary artery disease   . Cough   . Dyspnea   . Hypertension   . Lymphadenopathy, hilar   . MI (myocardial infarction) ([place])    x 2  . Wheezing     Past Surgical History:  Procedure Laterality Date  . BRONCHIAL NEEDLE ASPIRATION BIOPSY N/A 12/31/2014   Procedure: BRONCHIAL NEEDLE ASPIRATION BIOPSIES from carina;  Surgeon: Flora Lipps, MD;  Location: [facility];  Service: Cardiopulmonary;  Laterality: N/A;  . CARDIAC CATHETERIZATION N/A 12/28/2015   Procedure: Left Heart Cath and Cors/Grafts Angiography;  Surgeon: Yolonda Kida, MD;  Location: [place] [facility];  Service: Cardiovascular;  Laterality: N/A;  . CATARACT EXTRACTION W/PHACO Left 08/01/2016   Procedure: CATARACT EXTRACTION PHACO AND INTRAOCULAR LENS PLACEMENT ([place]) Left;  Surgeon: Leandrew Koyanagi, MD;  Location: [place];  Service: Ophthalmology;  Laterality: Left;  . COLON SURGERY    . CORONARY ANGIOPLASTY WITH STENT PLACEMENT    . CORONARY ARTERY BYPASS GRAFT    . ENDOBRONCHIAL ULTRASOUND N/A 12/31/2014   Procedure: ENDOBRONCHIAL ULTRASOUND;  Surgeon: Flora Lipps, MD;  Location: [facility];  Service: Cardiopulmonary;  Laterality: N/A;  . ESOPHAGOGASTRODUODENOSCOPY (EGD) WITH PROPOFOL N/A 06/20/2015   Procedure: ESOPHAGOGASTRODUODENOSCOPY (EGD) WITH PROPOFOL;  Surgeon: Lollie Sails, MD;  Location: [facility];  Service: Endoscopy;  Laterality: N/A;  . IR CATHETER TUBE CHANGE  10/26/2019  . IR CHOLANGIOGRAM EXISTING TUBE  08/13/2019    Family History  Problem Relation Age of Onset  . CAD Mother   . Colon cancer Father    Social History:  reports that he quit smoking about 42 years ago. His smokeless tobacco use includes chew. He reports current alcohol use. He reports that he  does not use drugs.  Allergies:  Allergies  Allergen Reactions  . Levaquin [Levofloxacin]     [name], her daughter, said Levaquin should be avoided because when patient had Levaquin a few years ago, he had "a bad reaction".  She said he could not walk and could not lift up his feet.  . 5ht3 Receptor Antagonists   . Diphenhydramine Hcl Other (See Comments)    Reaction:  Rash and fever a long time ago, but has had it since with no problem.  . Escitalopram Other (See Comments)    Reaction:  Makes him feel faint, like he was going to have a heart attack.  . Maxidex [Dexamethasone] Other (See Comments)    Reaction:  Unknown   . Prednisone Other (See Comments)    Pt states that this medication made him feel crazy.    . Serotonin Other (See Comments)    Tried 2 different types, lexapro and another one.  Reaction:  Made him feel like he was having a heart attack.   . Vytorin [Ezetimibe-Simvastatin] Other (See Comments)    Reaction:  Unknown   . Zocor [Simvastatin] Other (See Comments)    Reaction:  Unknown   . Diltiazem Rash    Medications: I have reviewed the patient's current medications. Prior to Admission medications   Medication Sig Start Date End Date Taking? Authorizing Provider  Ascorbic Acid (VITAMIN C PO) Take 1 tablet by mouth daily.   Yes [provider]  aspirin 81  MG EC tablet Take 1 tablet (81 mg total) by mouth daily. RESUME 48HRS after surgery 11/06/19  Yes Sakai, Isami, DO  atorvastatin (LIPITOR) 40 MG tablet Take 1 tablet (40 mg total) by mouth daily at 6 PM. Patient taking differently: Take 40 mg by mouth daily with supper.  12/30/15  Yes Dustin Flock, MD  digoxin (LANOXIN) 0.125 MG tablet Take 0.0625 mg by mouth daily.   Yes [provider]  famotidine (PEPCID) 20 MG tablet Take 20 mg by mouth at bedtime. 02/04/19  Yes [provider]  ferrous sulfate 325 (65 FE) MG tablet Take 1 tablet (325 mg total) by mouth every other day. Patient taking  differently: Take 325 mg by mouth every Monday, Wednesday, and Friday. With supper 03/04/19  Yes Verlon Au, NP  furosemide (LASIX) 40 MG tablet Take 1 tablet (40 mg total) by mouth daily. Patient taking differently: Take 20 mg by mouth daily.  06/10/19  Yes Danford, Suann Larry, MD  metoprolol tartrate (LOPRESSOR) 25 MG tablet Take 12.5 mg by mouth 2 (two) times daily.   Yes [provider]  neomycin-bacitracin-polymyxin (NEOSPORIN) ointment Apply 1 application topically every other day. Wound care (foot area)   Yes [provider]  ranolazine (RANEXA) 1000 MG SR tablet Take 500 mg by mouth 2 (two) times daily.  01/16/19  Yes [provider]  sucralfate (CARAFATE) 1 G tablet Take 1 g by mouth 2 (two) times daily.    Yes [provider]  tamsulosin (FLOMAX) 0.4 MG CAPS capsule Take 0.4 mg by mouth daily after supper.    Yes [provider]  vitamin B-12 (CYANOCOBALAMIN) 500 MCG tablet Take 500 mcg by mouth daily.   Yes [provider]    ROS: History obtained from the patient  General ROS: negative for - chills, fatigue, fever, night sweats, weight gain or weight loss Psychological ROS: negative for - behavioral disorder, hallucinations, memory difficulties, mood swings or suicidal ideation Ophthalmic ROS: negative for - blurry vision, double vision, eye pain or loss of vision ENT ROS: negative for - epistaxis, nasal discharge, oral lesions, sore throat, tinnitus or vertigo Allergy and Immunology ROS: negative for - hives or itchy/watery eyes Hematological and Lymphatic ROS: bruising Endocrine ROS: negative for - galactorrhea, hair pattern changes, polydipsia/polyuria or temperature intolerance Respiratory ROS: negative for - cough, hemoptysis, shortness of breath or wheezing Cardiovascular ROS: negative for - chest pain, dyspnea on exertion, edema or irregular heartbeat Gastrointestinal ROS: negative for - abdominal pain, diarrhea,  hematemesis, nausea/vomiting or stool incontinence Genito-Urinary ROS: negative for - dysuria, hematuria, incontinence or urinary frequency/urgency Musculoskeletal ROS: left foot pain Neurological ROS: as noted in HPI Dermatological ROS: negative for rash and skin lesion changes  Physical Examination: Blood pressure 105/70, pulse 66, temperature 97.6 F (36.4 C), temperature source Oral, resp. rate 17, height 6\' 4"  (1.93 m), weight 82.3 kg, SpO2 100 %.  HEENT-  Normocephalic, no lesions, without obvious abnormality.  Normal external eye and conjunctiva.  Normal TM's bilaterally.  Normal auditory canals and external ears. Normal external nose, mucus membranes and septum.  Normal pharynx. Cardiovascular- S1, S2 normal, pulses palpable throughout   Lungs- chest clear, no wheezing, rales, normal symmetric air entry Abdomen- soft, non-tender; bowel sounds normal; no masses,  no organomegaly Extremities- no edema, boot on left foot Lymph-no adenopathy palpable Musculoskeletal-no joint tenderness, deformity or swelling Skin-warm and dry, no hyperpigmentation, vitiligo, or suspicious lesions  Neurological Examination   Mental Status: Alert, oriented, thought content  appropriate.  Speech fluent without evidence of aphasia.  Able to follow 3 step commands without difficulty. Cranial Nerves: II: Visual fields grossly normal, pupils equal, round, reactive to light and accommodation III,IV, VI: ptosis not present, extra-ocular motions intact bilaterally V,VII: smile symmetric, facial light touch sensation normal bilaterally VIII: hearing normal bilaterally IX,X: gag reflex present XI: bilateral shoulder shrug XII: midline tongue extension Motor: Right : Upper extremity   5/5    Left:     Upper extremity   5/5  Lower extremity   5/5     Lower extremity   5/5 Tone and bulk:normal tone throughout; no atrophy noted Sensory: Pinprick and light touch intact throughout, bilaterally Deep Tendon  Reflexes: Symmetric throughout Plantars: Right: mute   Left: mute Cerebellar: Normal finger-to-nose and normal heel-to-shin testing bilaterally Gait: not tested due to boot placement    Laboratory Studies:  Basic Metabolic Panel: Recent Labs  Lab 02/26/20 0542  NA 137  K 4.4  CL 102  CO2 25  GLUCOSE 101*  BUN 30*  CREATININE 1.77*  CALCIUM 9.1    Liver Function Tests: Recent Labs  Lab 02/26/20 0542  AST 19  ALT 17  ALKPHOS 55  BILITOT 0.9  PROT 7.5  ALBUMIN 4.3   No results for input(s): LIPASE, AMYLASE in the last 168 hours. No results for input(s): AMMONIA in the last 168 hours.  CBC: Recent Labs  Lab 02/26/20 0542  WBC 9.8  HGB 9.2*  HCT 27.5*  MCV 105.4*  PLT 121*    Cardiac Enzymes: No results for input(s): CKTOTAL, CKMB, CKMBINDEX, TROPONINI in the last 168 hours.  BNP: Invalid input(s): POCBNP  CBG: No results for input(s): GLUCAP in the last 168 hours.  Microbiology: Results for orders placed or performed during the hospital encounter of 02/26/20  Respiratory Panel by RT PCR (Flu A&B, Covid) - Nasopharyngeal Swab     Status: None   Collection Time: 02/26/20  5:42 AM   Specimen: Nasopharyngeal Swab  Result Value Ref Range Status   SARS Coronavirus 2 by RT PCR NEGATIVE NEGATIVE Final    Comment: (NOTE) SARS-CoV-2 target nucleic acids are NOT DETECTED.  The SARS-CoV-2 RNA is generally detectable in upper respiratoy specimens during the acute phase of infection. The lowest concentration of SARS-CoV-2 viral copies this assay can detect is 131 copies/mL. A negative result does not preclude SARS-Cov-2 infection and should not be used as the sole basis for treatment or other patient management decisions. A negative result may occur with  improper specimen collection/handling, submission of specimen other than nasopharyngeal swab, presence of viral mutation(s) within the areas targeted by this assay, and inadequate number of viral  copies (<131 copies/mL). A negative result must be combined with clinical observations, patient history, and epidemiological information. The expected result is Negative.  Fact Sheet for Patients:  PinkCheek.be  Fact Sheet for Healthcare Providers:  GravelBags.it  This test is no t yet approved or cleared by the [place] FDA and  has been authorized for detection and/or diagnosis of SARS-CoV-2 by FDA under an Emergency Use Authorization (EUA). This EUA will remain  in effect (meaning this test can be used) for the duration of the COVID-19 declaration under Section 564(b)(1) of the Act, 21 U.S.C. section 360bbb-3(b)(1), unless the authorization is terminated or revoked sooner.     Influenza A by PCR NEGATIVE NEGATIVE Final   Influenza B by PCR NEGATIVE NEGATIVE Final    Comment: (NOTE) The Xpert Xpress SARS-CoV-2/FLU/RSV assay is intended  as an aid in  the diagnosis of influenza from Nasopharyngeal swab specimens and  should not be used as a sole basis for treatment. Nasal washings and  aspirates are unacceptable for Xpert Xpress SARS-CoV-2/FLU/RSV  testing.  Fact Sheet for Patients: PinkCheek.be  Fact Sheet for Healthcare Providers: GravelBags.it  This test is not yet approved or cleared by the [place] FDA and  has been authorized for detection and/or diagnosis of SARS-CoV-2 by  FDA under an Emergency Use Authorization (EUA). This EUA will remain  in effect (meaning this test can be used) for the duration of the  Covid-19 declaration under Section 564(b)(1) of the Act, 21  U.S.C. section 360bbb-3(b)(1), unless the authorization is  terminated or revoked. Performed at [facility], [address] [place], [place] [address]     Coagulation Studies: No results for input(s): LABPROT, INR in the last 72 hours.  Urinalysis:  Recent Labs   Lab 02/26/20 1250  COLORURINE YELLOW*  LABSPEC 1.011  PHURINE 5.0  GLUCOSEU NEGATIVE  HGBUR NEGATIVE  BILIRUBINUR NEGATIVE  KETONESUR NEGATIVE  PROTEINUR NEGATIVE  NITRITE NEGATIVE  LEUKOCYTESUR NEGATIVE    Lipid Panel:    Component Value Date/Time   CHOL 79 02/27/2020 0448   TRIG 116 02/27/2020 0448   HDL 18 (L) 02/27/2020 0448   CHOLHDL 4.4 02/27/2020 0448   VLDL 23 02/27/2020 0448   LDLCALC 38 02/27/2020 0448    HgbA1C:  Lab Results  Component Value Date   HGBA1C 5.7 12/26/2015    Urine Drug Screen:  No results found for: LABOPIA, COCAINSCRNUR, LABBENZ, AMPHETMU, THCU, LABBARB  Alcohol Level: No results for input(s): ETH in the last 168 hours.   Imaging: DG Chest 2 View  Result Date: 02/26/2020 CLINICAL DATA:  Weakness. EXAM: CHEST - 2 VIEW COMPARISON:  Generalized weakness.  Loss of taste. FINDINGS: Heart size upper limits of normal. Changes of COPD are present. No focal airspace disease is evident. Degenerative changes are present in the thoracic spine. Patient is status post CABG. IMPRESSION: 1. No acute cardiopulmonary disease. 2. Changes of COPD. Electronically Signed   By: San Morelle M.D.   On: 02/26/2020 06:14   CT HEAD WO CONTRAST  Result Date: 02/27/2020 CLINICAL DATA:  Parenchymal hemorrhage, follow-up. EXAM: CT HEAD WITHOUT CONTRAST TECHNIQUE: Contiguous axial images were obtained from the base of the skull through the vertex without intravenous contrast. COMPARISON:  02/26/2020 head CT and MRI head. 01/07/2019 head CT and prior. FINDINGS: Brain: Small focus of left occipital hemorrhage seen on prior MRI correlates with minimal hyperdensities on CT (2: 12). No new or enlarging intracranial hemorrhage. No acute infarct. No mass lesion. No midline shift, ventriculomegaly or extra-axial fluid collection. Mild cerebral atrophy with ex vacuo dilatation. Chronic microvascular ischemic changes. Vascular: No hyperdense vessel or unexpected calcification.  Skull base atherosclerotic calcifications. Skull: Negative for fracture or focal lesion. Sinuses/Orbits: Normal orbits. Clear paranasal sinuses. No mastoid effusion. Other: None. IMPRESSION: Minimal left occipital hemorrhage, unchanged. No new or enlarging intracranial hemorrhage. Age-related cerebral atrophy and chronic microvascular ischemic changes. Electronically Signed   By: Primitivo Gauze M.D.   On: 02/27/2020 10:35   CT Head Wo Contrast  Result Date: 02/26/2020 CLINICAL DATA:  Diffuse weakness and tingling which began last night. EXAM: CT HEAD WITHOUT CONTRAST TECHNIQUE: Contiguous axial images were obtained from the base of the skull through the vertex without intravenous contrast. COMPARISON:  Head CT scan 01/07/2019. FINDINGS: Brain: No evidence of acute infarction, hemorrhage, hydrocephalus, extra-axial collection or  mass lesion/mass effect. Atrophy, chronic microvascular ischemic change and remote left cerebellar lacunar infarct noted. Vascular: No hyperdense vessel or unexpected calcification. Skull: Intact.  No focal lesion. Sinuses/Orbits: Negative. Other: None. IMPRESSION: No acute abnormality. Atrophy and chronic microvascular ischemic change. Electronically Signed   By: Inge Rise M.D.   On: 02/26/2020 09:42   MR ANGIO HEAD WO CONTRAST  Result Date: 02/26/2020 CLINICAL DATA:  Neuro deficit.  Weakness and tingling.  Headache. EXAM: MRI HEAD WITHOUT CONTRAST MRA HEAD WITHOUT CONTRAST TECHNIQUE: Multiplanar, multiecho pulse sequences of the brain and surrounding structures were obtained without intravenous contrast. Angiographic images of the head were obtained using MRA technique without contrast. COMPARISON:  02/26/2020 head CT and prior. FINDINGS: Image quality is mildly degraded by motion artifact. MRI HEAD FINDINGS Brain: No diffusion-weighted signal abnormality. Left occipital SWI signal dropout with correlative intrinsically T1/FLAIR hyperintense signal (16:57). No midline  shift, ventriculomegaly or extra-axial fluid collection. No mass lesion. Scattered bilateral cerebral and cerebellar remote microhemorrhages. Mild cerebral atrophy with ex vacuo dilatation. Moderate chronic microvascular ischemic changes. Vascular: Please see MRA head. Skull and upper cervical spine: Normal marrow signal. Sinuses/Orbits: Sequela of bilateral lens replacement. Minimal maxillary sinus mucosal thickening. Trace right mastoid free fluid. Other: None. MRA HEAD FINDINGS Anterior circulation: Patent normal caliber appearance of the bilateral ICAs, anterior and middle cerebral arteries. The right posterior communicating artery may be absent. Left PCOM hypoplasia. No significant stenosis, proximal occlusion, aneurysm, or vascular malformation. Posterior circulation: Dominant left vertebral artery. Proximally patent right PICA. Patent normal caliber basilar, superior cerebellar and posterior cerebral arteries. Patent normal caliber bilateral AICA. No significant stenosis, proximal occlusion, aneurysm, or vascular malformation. Venous sinuses: No evidence of thrombosis. Anatomic variants: Discussed above. IMPRESSION: MRI head: Small focus of left occipital intraparenchymal hemorrhage, likely subacute. Sequela of cerebral amyloid angiopathy. Mild cerebral atrophy. Moderate chronic microvascular ischemic changes. MRA head: No large vessel occlusion, high-grade narrowing or aneurysm. These results were called by telephone at the time of interpretation on 02/26/2020 at 11:45 am to provider Merlyn Lot , who verbally acknowledged these results. Electronically Signed   By: Primitivo Gauze M.D.   On: 02/26/2020 11:55   MR BRAIN WO CONTRAST  Result Date: 02/26/2020 CLINICAL DATA:  Neuro deficit.  Weakness and tingling.  Headache. EXAM: MRI HEAD WITHOUT CONTRAST MRA HEAD WITHOUT CONTRAST TECHNIQUE: Multiplanar, multiecho pulse sequences of the brain and surrounding structures were obtained without  intravenous contrast. Angiographic images of the head were obtained using MRA technique without contrast. COMPARISON:  02/26/2020 head CT and prior. FINDINGS: Image quality is mildly degraded by motion artifact. MRI HEAD FINDINGS Brain: No diffusion-weighted signal abnormality. Left occipital SWI signal dropout with correlative intrinsically T1/FLAIR hyperintense signal (16:57). No midline shift, ventriculomegaly or extra-axial fluid collection. No mass lesion. Scattered bilateral cerebral and cerebellar remote microhemorrhages. Mild cerebral atrophy with ex vacuo dilatation. Moderate chronic microvascular ischemic changes. Vascular: Please see MRA head. Skull and upper cervical spine: Normal marrow signal. Sinuses/Orbits: Sequela of bilateral lens replacement. Minimal maxillary sinus mucosal thickening. Trace right mastoid free fluid. Other: None. MRA HEAD FINDINGS Anterior circulation: Patent normal caliber appearance of the bilateral ICAs, anterior and middle cerebral arteries. The right posterior communicating artery may be absent. Left PCOM hypoplasia. No significant stenosis, proximal occlusion, aneurysm, or vascular malformation. Posterior circulation: Dominant left vertebral artery. Proximally patent right PICA. Patent normal caliber basilar, superior cerebellar and posterior cerebral arteries. Patent normal caliber bilateral AICA. No significant stenosis, proximal occlusion, aneurysm, or vascular malformation. Venous sinuses:  No evidence of thrombosis. Anatomic variants: Discussed above. IMPRESSION: MRI head: Small focus of left occipital intraparenchymal hemorrhage, likely subacute. Sequela of cerebral amyloid angiopathy. Mild cerebral atrophy. Moderate chronic microvascular ischemic changes. MRA head: No large vessel occlusion, high-grade narrowing or aneurysm. These results were called by telephone at the time of interpretation on 02/26/2020 at 11:45 am to provider Merlyn Lot , who verbally  acknowledged these results. Electronically Signed   By: Primitivo Gauze M.D.   On: 02/26/2020 11:55   ECHOCARDIOGRAM COMPLETE  Result Date: 02/27/2020    ECHOCARDIOGRAM REPORT   Patient Name:   Andres Gutierrez Date of Exam: 02/27/2020 Medical Rec #:  [id number]    Height:       76.0 in Accession #:    [id number]   Weight:       181.4 lb Date of Birth:  02-02-1931    BSA:          2.125 m Patient Age:    11 years     BP:           119/65 mmHg Patient Gender: M            HR:           69 bpm. Exam Location:  [facility] Procedure: 2D Echo Indications:     Stroke 434.91 / I163.9  History:         Patient has prior history of Echocardiogram examinations, most                  recent 07/30/2019. Previous Myocardial Infarction,                  Arrythmias:Atrial Fibrillation, Signs/Symptoms:Dyspnea; Risk                  Factors:Hypertension.  Sonographer:     Avanell Shackleton Referring Phys:  [id number] AGBATA Diagnosing Phys: Bartholome Bill MD IMPRESSIONS  1. Left ventricular ejection fraction, by estimation, is 35 to 40%. Left ventricular ejection fraction by PLAX is 40 %. The left ventricle has mild to moderately decreased function. The left ventricle demonstrates regional wall motion abnormalities (see  scoring diagram/findings for description). The left ventricular internal cavity size was mildly dilated. Left ventricular diastolic parameters are consistent with Grade I diastolic dysfunction (impaired relaxation).  2. Right ventricular systolic function is normal. The right ventricular size is normal.  3. Left atrial size was mildly dilated.  4. Right atrial size was mildly dilated.  5. The mitral valve is grossly normal. Mild mitral valve regurgitation.  6. The aortic valve was not well visualized. Aortic valve regurgitation is mild. Mild aortic valve stenosis. FINDINGS  Left Ventricle: Left ventricular ejection fraction, by estimation, is 35 to 40%. Left ventricular ejection fraction by PLAX is 40 %. The left  ventricle has mild to moderately decreased function. The left ventricle demonstrates regional wall motion abnormalities. The left ventricular internal cavity size was mildly dilated. There is borderline left ventricular hypertrophy. Left ventricular diastolic parameters are consistent with Grade I diastolic dysfunction (impaired relaxation).  LV Wall Scoring: The mid and distal anterior septum is akinetic. The antero-lateral wall, basal anteroseptal segment, apical lateral segment, mid inferoseptal segment, basal inferoseptal segment, and apex are hypokinetic. The posterior wall is normal. Right Ventricle: The right ventricular size is normal. No increase in right ventricular wall thickness. Right ventricular systolic function is normal. Left Atrium: Left atrial size was mildly dilated. Right Atrium: Right atrial size was mildly dilated. Pericardium: There  is no evidence of pericardial effusion. Mitral Valve: The mitral valve is grossly normal. Mild mitral valve regurgitation. Tricuspid Valve: The tricuspid valve is not well visualized. Tricuspid valve regurgitation is trivial. Aortic Valve: The aortic valve was not well visualized. Aortic valve regurgitation is mild. Mild aortic stenosis is present. Aortic valve mean gradient measures 12.0 mmHg. Aortic valve peak gradient measures 20.2 mmHg. Aortic valve area, by VTI measures 2.38 cm. Pulmonic Valve: The pulmonic valve was not well visualized. Pulmonic valve regurgitation is trivial. Aorta: The aortic root was not well visualized. IAS/Shunts: The interatrial septum was not assessed.  LEFT VENTRICLE PLAX 2D LV EF:         Left            Diastology                ventricular     LV e' lateral:   6.74 cm/s                ejection        LV E/e' lateral: 14.5                fraction by                PLAX is 40                %. LVIDd:         5.62 cm LVIDs:         4.51 cm LV PW:         1.35 cm LV IVS:        0.85 cm LVOT diam:     2.40 cm LV SV:         111 LV SV  Index:   52 LVOT Area:     4.52 cm  RIGHT VENTRICLE         IVC TAPSE (M-mode): 1.8 cm  IVC diam: 1.64 cm LEFT ATRIUM              Index       RIGHT ATRIUM           Index LA diam:        4.80 cm  2.26 cm/m  RA Area:     20.60 cm LA Vol (A2C):   111.0 ml 52.23 ml/m RA Volume:   57.70 ml  27.15 ml/m LA Vol (A4C):   97.6 ml  45.92 ml/m LA Biplane Vol: 104.0 ml 48.94 ml/m  AORTIC VALVE AV Area (Vmax):    2.49 cm AV Area (Vmean):   2.24 cm AV Area (VTI):     2.38 cm AV Vmax:           225.00 cm/s AV Vmean:          166.000 cm/s AV VTI:            0.466 m AV Peak Grad:      20.2 mmHg AV Mean Grad:      12.0 mmHg LVOT Vmax:         124.00 cm/s LVOT Vmean:        82.100 cm/s LVOT VTI:          0.245 m LVOT/AV VTI ratio: 0.53  AORTA Ao Root diam: 3.70 cm MITRAL VALVE               TRICUSPID VALVE MV Area (PHT): 6.54 cm    TR Peak grad:   22.3 mmHg MV Decel Time:  116 msec    TR Vmax:        236.00 cm/s MV E velocity: 97.40 cm/s MV A velocity: 94.60 cm/s  SHUNTS MV E/A ratio:  1.03        Systemic VTI:  0.24 m                            Systemic Diam: 2.40 cm Bartholome Bill MD Electronically signed by Bartholome Bill MD Signature Date/Time: 02/27/2020/10:54:27 AM    Final     Assessment: 86 y.o. male with medical history significant for chronic anemia and thrombocytopenia, stage III chronic kidney disease, CHF and anxiety who was brought into the emergency room by his daughter for evaluation of multiple symptoms which include transient loss of taste which has improved but not back to baseline, generalized weakness and a headache.  Head CT reviewed and shows no acute changes.  MRI of the brain performed and reviewed and shows a small area of intraparenchymal hemorrhage in the left occipital lobe.  Evidence of amyloid angiopathy noted as well.  No evidence of aneurysm.    - repeat CTH stable occipital L sided homorrhage - this is amyloid angiopathy.  Explained to patient and daughter at bedside what amyloid is.  -  pt lives alone might need home health pt - no driving - d/c planning from Neurological stand point

## 2020-02-27 NOTE — Progress Notes (Addendum)
SLP Cancellation Note  Patient Details Name: ED MANDICH MRN: [id number] DOB: [birth year]   Cancelled treatment:       Reason Eval/Treat Not Completed: SLP screened, no needs identified, will sign off (chart reviewed; consulted NSG then met w/ pt)  Pt denied any difficulty swallowing and is currently on a regular diet; tolerates swallowing pills w/ water per NSG. He stated he ate his complete breakfast this morning(noted on tray table) w/ taste Improving now. Pt conversed at conversational level w/out deficits noted; pt denied any speech-language deficits. Noted MRI: Small focus of left occipital intraparenchymal hemorrhage, likely subacute. Mild cerebral atrophy. Moderate chronic microvascular ischemic changes. No further skilled ST services indicated as pt appears at his baseline. Pt agreed. NSG to reconsult if any change in status.     Orinda Kenner, MS, CCC-SLP Speech Language Pathologist Rehab Services [phone] [facility] 02/27/2020, 9:08 AM

## 2020-02-29 LAB — HEMOGLOBIN A1C
Hgb A1c MFr Bld: 5.1 % (ref 4.8–5.6)
Mean Plasma Glucose: 100 mg/dL

## 2020-03-02 DIAGNOSIS — Z23 Encounter for immunization: Secondary | ICD-10-CM | POA: Diagnosis not present

## 2020-03-02 DIAGNOSIS — I619 Nontraumatic intracerebral hemorrhage, unspecified: Secondary | ICD-10-CM | POA: Insufficient documentation

## 2020-03-02 DIAGNOSIS — N1832 Chronic kidney disease, stage 3b: Secondary | ICD-10-CM | POA: Diagnosis not present

## 2020-03-03 ENCOUNTER — Other Ambulatory Visit: Payer: Self-pay

## 2020-03-03 ENCOUNTER — Observation Stay: Payer: PPO

## 2020-03-03 ENCOUNTER — Observation Stay
Admission: EM | Admit: 2020-03-03 | Discharge: 2020-03-04 | Disposition: A | Discharge status: Home / Self Care | Payer: PPO | Attending: Internal Medicine | Admitting: Internal Medicine

## 2020-03-03 ENCOUNTER — Emergency Department: Payer: PPO

## 2020-03-03 ENCOUNTER — Encounter: Payer: Self-pay | Admitting: Radiology

## 2020-03-03 DIAGNOSIS — D649 Anemia, unspecified: Secondary | ICD-10-CM | POA: Diagnosis present

## 2020-03-03 DIAGNOSIS — I10 Essential (primary) hypertension: Secondary | ICD-10-CM | POA: Diagnosis not present

## 2020-03-03 DIAGNOSIS — Z79899 Other long term (current) drug therapy: Secondary | ICD-10-CM | POA: Diagnosis not present

## 2020-03-03 DIAGNOSIS — N1831 Chronic kidney disease, stage 3a: Secondary | ICD-10-CM | POA: Diagnosis not present

## 2020-03-03 DIAGNOSIS — Z8673 Personal history of transient ischemic attack (TIA), and cerebral infarction without residual deficits: Secondary | ICD-10-CM | POA: Insufficient documentation

## 2020-03-03 DIAGNOSIS — N183 Chronic kidney disease, stage 3 unspecified: Secondary | ICD-10-CM | POA: Diagnosis present

## 2020-03-03 DIAGNOSIS — H538 Other visual disturbances: Secondary | ICD-10-CM | POA: Diagnosis not present

## 2020-03-03 DIAGNOSIS — R2689 Other abnormalities of gait and mobility: Secondary | ICD-10-CM | POA: Insufficient documentation

## 2020-03-03 DIAGNOSIS — K219 Gastro-esophageal reflux disease without esophagitis: Secondary | ICD-10-CM | POA: Diagnosis not present

## 2020-03-03 DIAGNOSIS — N200 Calculus of kidney: Secondary | ICD-10-CM | POA: Insufficient documentation

## 2020-03-03 DIAGNOSIS — R109 Unspecified abdominal pain: Secondary | ICD-10-CM | POA: Diagnosis present

## 2020-03-03 DIAGNOSIS — G319 Degenerative disease of nervous system, unspecified: Secondary | ICD-10-CM | POA: Diagnosis not present

## 2020-03-03 DIAGNOSIS — N201 Calculus of ureter: Secondary | ICD-10-CM | POA: Diagnosis present

## 2020-03-03 DIAGNOSIS — Z951 Presence of aortocoronary bypass graft: Secondary | ICD-10-CM | POA: Insufficient documentation

## 2020-03-03 DIAGNOSIS — I130 Hypertensive heart and chronic kidney disease with heart failure and stage 1 through stage 4 chronic kidney disease, or unspecified chronic kidney disease: Secondary | ICD-10-CM | POA: Insufficient documentation

## 2020-03-03 DIAGNOSIS — I619 Nontraumatic intracerebral hemorrhage, unspecified: Secondary | ICD-10-CM | POA: Diagnosis not present

## 2020-03-03 DIAGNOSIS — I5022 Chronic systolic (congestive) heart failure: Secondary | ICD-10-CM | POA: Diagnosis not present

## 2020-03-03 DIAGNOSIS — D539 Nutritional anemia, unspecified: Secondary | ICD-10-CM | POA: Diagnosis not present

## 2020-03-03 DIAGNOSIS — I2510 Atherosclerotic heart disease of native coronary artery without angina pectoris: Secondary | ICD-10-CM | POA: Insufficient documentation

## 2020-03-03 DIAGNOSIS — Z20822 Contact with and (suspected) exposure to covid-19: Secondary | ICD-10-CM | POA: Insufficient documentation

## 2020-03-03 DIAGNOSIS — R1032 Left lower quadrant pain: Secondary | ICD-10-CM | POA: Diagnosis not present

## 2020-03-03 DIAGNOSIS — Z955 Presence of coronary angioplasty implant and graft: Secondary | ICD-10-CM | POA: Insufficient documentation

## 2020-03-03 DIAGNOSIS — Z7982 Long term (current) use of aspirin: Secondary | ICD-10-CM | POA: Insufficient documentation

## 2020-03-03 DIAGNOSIS — I639 Cerebral infarction, unspecified: Secondary | ICD-10-CM | POA: Diagnosis present

## 2020-03-03 DIAGNOSIS — R531 Weakness: Secondary | ICD-10-CM | POA: Insufficient documentation

## 2020-03-03 DIAGNOSIS — D696 Thrombocytopenia, unspecified: Secondary | ICD-10-CM | POA: Diagnosis not present

## 2020-03-03 DIAGNOSIS — Z87891 Personal history of nicotine dependence: Secondary | ICD-10-CM | POA: Insufficient documentation

## 2020-03-03 DIAGNOSIS — I959 Hypotension, unspecified: Principal | ICD-10-CM | POA: Insufficient documentation

## 2020-03-03 DIAGNOSIS — R7989 Other specified abnormal findings of blood chemistry: Secondary | ICD-10-CM | POA: Diagnosis not present

## 2020-03-03 DIAGNOSIS — R55 Syncope and collapse: Secondary | ICD-10-CM

## 2020-03-03 DIAGNOSIS — R202 Paresthesia of skin: Secondary | ICD-10-CM | POA: Diagnosis not present

## 2020-03-03 DIAGNOSIS — H748X1 Other specified disorders of right middle ear and mastoid: Secondary | ICD-10-CM | POA: Diagnosis not present

## 2020-03-03 DIAGNOSIS — R778 Other specified abnormalities of plasma proteins: Secondary | ICD-10-CM | POA: Diagnosis not present

## 2020-03-03 DIAGNOSIS — I6782 Cerebral ischemia: Secondary | ICD-10-CM | POA: Diagnosis not present

## 2020-03-03 DIAGNOSIS — Z7901 Long term (current) use of anticoagulants: Secondary | ICD-10-CM | POA: Insufficient documentation

## 2020-03-03 DIAGNOSIS — I482 Chronic atrial fibrillation, unspecified: Secondary | ICD-10-CM | POA: Diagnosis not present

## 2020-03-03 DIAGNOSIS — E785 Hyperlipidemia, unspecified: Secondary | ICD-10-CM | POA: Diagnosis not present

## 2020-03-03 DIAGNOSIS — G4489 Other headache syndrome: Secondary | ICD-10-CM | POA: Diagnosis not present

## 2020-03-03 DIAGNOSIS — I616 Nontraumatic intracerebral hemorrhage, multiple localized: Secondary | ICD-10-CM | POA: Diagnosis not present

## 2020-03-03 DIAGNOSIS — R0602 Shortness of breath: Secondary | ICD-10-CM | POA: Diagnosis not present

## 2020-03-03 DIAGNOSIS — R29810 Facial weakness: Secondary | ICD-10-CM | POA: Diagnosis not present

## 2020-03-03 LAB — CBC WITH DIFFERENTIAL/PLATELET
Abs Immature Granulocytes: 0.14 10*3/uL — ABNORMAL HIGH (ref 0.00–0.07)
Basophils Absolute: 0 10*3/uL (ref 0.0–0.1)
Basophils Relative: 0 %
Eosinophils Absolute: 0.1 10*3/uL (ref 0.0–0.5)
Eosinophils Relative: 1 %
HCT: 28.3 % — ABNORMAL LOW (ref 39.0–52.0)
Hemoglobin: 9.4 g/dL — ABNORMAL LOW (ref 13.0–17.0)
Immature Granulocytes: 2 %
Lymphocytes Relative: 20 %
Lymphs Abs: 1.9 10*3/uL (ref 0.7–4.0)
MCH: 35.1 pg — ABNORMAL HIGH (ref 26.0–34.0)
MCHC: 33.2 g/dL (ref 30.0–36.0)
MCV: 105.6 fL — ABNORMAL HIGH (ref 80.0–100.0)
Monocytes Absolute: 3.2 10*3/uL — ABNORMAL HIGH (ref 0.1–1.0)
Monocytes Relative: 34 %
Neutro Abs: 4 10*3/uL (ref 1.7–7.7)
Neutrophils Relative %: 43 %
Platelets: 111 10*3/uL — ABNORMAL LOW (ref 150–400)
RBC: 2.68 MIL/uL — ABNORMAL LOW (ref 4.22–5.81)
RDW: 16 % — ABNORMAL HIGH (ref 11.5–15.5)
Smear Review: NORMAL
WBC: 9.3 10*3/uL (ref 4.0–10.5)
nRBC: 0 % (ref 0.0–0.2)

## 2020-03-03 LAB — COMPREHENSIVE METABOLIC PANEL
ALT: 16 U/L (ref 0–44)
AST: 20 U/L (ref 15–41)
Albumin: 4.4 g/dL (ref 3.5–5.0)
Alkaline Phosphatase: 57 U/L (ref 38–126)
Anion gap: 11 (ref 5–15)
BUN: 29 mg/dL — ABNORMAL HIGH (ref 8–23)
CO2: 22 mmol/L (ref 22–32)
Calcium: 9.2 mg/dL (ref 8.9–10.3)
Chloride: 105 mmol/L (ref 98–111)
Creatinine, Ser: 1.85 mg/dL — ABNORMAL HIGH (ref 0.61–1.24)
GFR, Estimated: 35 mL/min — ABNORMAL LOW (ref >60)
Glucose, Bld: 100 mg/dL — ABNORMAL HIGH (ref 70–99)
Potassium: 4.4 mmol/L (ref 3.5–5.1)
Sodium: 138 mmol/L (ref 135–145)
Total Bilirubin: 1.1 mg/dL (ref 0.3–1.2)
Total Protein: 7.7 g/dL (ref 6.5–8.1)

## 2020-03-03 LAB — RESPIRATORY PANEL BY RT PCR (FLU A&B, COVID)
Influenza A by PCR: NEGATIVE
Influenza B by PCR: NEGATIVE
SARS Coronavirus 2 by RT PCR: NEGATIVE

## 2020-03-03 LAB — PROTIME-INR
INR: 1.1 (ref 0.8–1.2)
Prothrombin Time: 14.1 seconds (ref 11.4–15.2)

## 2020-03-03 LAB — PATHOLOGIST SMEAR REVIEW

## 2020-03-03 LAB — TROPONIN I (HIGH SENSITIVITY)
Troponin I (High Sensitivity): 21 ng/L — ABNORMAL HIGH (ref <18)
Troponin I (High Sensitivity): 21 ng/L — ABNORMAL HIGH (ref <18)
Troponin I (High Sensitivity): 22 ng/L — ABNORMAL HIGH (ref <18)
Troponin I (High Sensitivity): 20 ng/L — ABNORMAL HIGH (ref <18)

## 2020-03-03 LAB — BRAIN NATRIURETIC PEPTIDE: B Natriuretic Peptide: 293.7 pg/mL — ABNORMAL HIGH (ref 0.0–100.0)

## 2020-03-03 LAB — APTT: aPTT: 40 seconds — ABNORMAL HIGH (ref 24–36)

## 2020-03-03 LAB — LIPASE, BLOOD: Lipase: 32 U/L (ref 11–51)

## 2020-03-03 LAB — VITAMIN B12: Vitamin B-12: 983 pg/mL — ABNORMAL HIGH (ref 180–914)

## 2020-03-03 MED ORDER — HYDROXYZINE HCL 10 MG PO TABS
10.0000 mg | ORAL_TABLET | Freq: Three times a day (TID) | ORAL | Status: DC | PRN
  Filled 2020-03-03: qty 1

## 2020-03-03 MED ORDER — LACTATED RINGERS IV BOLUS
1000.0000 mL | Freq: Once | INTRAVENOUS | Status: AC
  Administered 2020-03-03: 1000 mL via INTRAVENOUS

## 2020-03-03 MED ORDER — TAMSULOSIN HCL 0.4 MG PO CAPS
0.4000 mg | ORAL_CAPSULE | Freq: Every day | ORAL | Status: DC
  Administered 2020-03-03: 0.4 mg via ORAL
  Filled 2020-03-03: qty 1

## 2020-03-03 MED ORDER — FAMOTIDINE 20 MG PO TABS
20.0000 mg | ORAL_TABLET | Freq: Every day | ORAL | Status: DC
  Administered 2020-03-03: 22:00:00 20 mg via ORAL
  Filled 2020-03-03: qty 1

## 2020-03-03 MED ORDER — BACITRACIN-NEOMYCIN-POLYMYXIN 400-5-5000 EX OINT
1.0000 "application " | TOPICAL_OINTMENT | CUTANEOUS | Status: DC
  Administered 2020-03-04: 1 via TOPICAL
  Filled 2020-03-03: qty 1

## 2020-03-03 MED ORDER — DIGOXIN 125 MCG PO TABS
0.0625 mg | ORAL_TABLET | Freq: Every day | ORAL | Status: DC
  Administered 2020-03-04: 0.0625 mg via ORAL
  Filled 2020-03-03: qty 0.5

## 2020-03-03 MED ORDER — ACETAMINOPHEN 650 MG RE SUPP: 650.0000 mg | RECTAL | Status: DC | PRN

## 2020-03-03 MED ORDER — FERROUS SULFATE 325 (65 FE) MG PO TABS
325.0000 mg | ORAL_TABLET | ORAL | Status: DC
  Administered 2020-03-04: 325 mg via ORAL
  Filled 2020-03-03: qty 1

## 2020-03-03 MED ORDER — ASPIRIN EC 81 MG PO TBEC
81.0000 mg | DELAYED_RELEASE_TABLET | Freq: Every day | ORAL | Status: DC
  Administered 2020-03-04: 81 mg via ORAL
  Filled 2020-03-03: qty 1

## 2020-03-03 MED ORDER — STROKE: EARLY STAGES OF RECOVERY BOOK: Freq: Once | Status: DC

## 2020-03-03 MED ORDER — ACETAMINOPHEN 325 MG PO TABS: 650.0000 mg | ORAL_TABLET | ORAL | Status: DC | PRN

## 2020-03-03 MED ORDER — RANOLAZINE ER 500 MG PO TB12
500.0000 mg | ORAL_TABLET | Freq: Two times a day (BID) | ORAL | Status: DC
  Administered 2020-03-03 – 2020-03-04 (×2): 500 mg via ORAL
  Filled 2020-03-03 (×3): qty 1

## 2020-03-03 MED ORDER — SENNOSIDES-DOCUSATE SODIUM 8.6-50 MG PO TABS: 1.0000 | ORAL_TABLET | Freq: Every evening | ORAL | Status: DC | PRN

## 2020-03-03 MED ORDER — ATORVASTATIN CALCIUM 20 MG PO TABS
40.0000 mg | ORAL_TABLET | Freq: Every day | ORAL | Status: DC
  Administered 2020-03-03: 40 mg via ORAL
  Filled 2020-03-03: qty 2

## 2020-03-03 MED ORDER — CYANOCOBALAMIN 500 MCG PO TABS
500.0000 ug | ORAL_TABLET | Freq: Every day | ORAL | Status: DC
  Administered 2020-03-03 – 2020-03-04 (×2): 500 ug via ORAL
  Filled 2020-03-03 (×2): qty 1

## 2020-03-03 MED ORDER — IOHEXOL 300 MG/ML  SOLN
75.0000 mL | Freq: Once | INTRAMUSCULAR | Status: AC | PRN
  Administered 2020-03-03: 75 mL via INTRAVENOUS

## 2020-03-03 MED ORDER — ACETAMINOPHEN 160 MG/5ML PO SOLN
650.0000 mg | ORAL | Status: DC | PRN
  Filled 2020-03-03: qty 20.3

## 2020-03-03 MED ORDER — SUCRALFATE 1 G PO TABS
1.0000 g | ORAL_TABLET | Freq: Two times a day (BID) | ORAL | Status: DC
  Administered 2020-03-03 – 2020-03-04 (×2): 1 g via ORAL
  Filled 2020-03-03 (×2): qty 1

## 2020-03-03 MED ORDER — HYDRALAZINE HCL 20 MG/ML IJ SOLN: 5.0000 mg | INTRAMUSCULAR | Status: DC | PRN

## 2020-03-03 MED ORDER — FUROSEMIDE 40 MG PO TABS
20.0000 mg | ORAL_TABLET | Freq: Every day | ORAL | Status: DC
  Administered 2020-03-03 – 2020-03-04 (×2): 20 mg via ORAL
  Filled 2020-03-03 (×2): qty 1

## 2020-03-03 MED ORDER — ADULT MULTIVITAMIN W/MINERALS CH
1.0000 | ORAL_TABLET | Freq: Every day | ORAL | Status: DC
  Administered 2020-03-03 – 2020-03-04 (×2): 1 via ORAL
  Filled 2020-03-03 (×2): qty 1

## 2020-03-03 MED ORDER — METOPROLOL TARTRATE 25 MG PO TABS
12.5000 mg | ORAL_TABLET | Freq: Two times a day (BID) | ORAL | Status: DC
  Administered 2020-03-03 – 2020-03-04 (×2): 12.5 mg via ORAL
  Filled 2020-03-03 (×2): qty 1

## 2020-03-03 MED ORDER — ASCORBIC ACID 500 MG PO TABS
500.0000 mg | ORAL_TABLET | Freq: Every day | ORAL | Status: DC
  Administered 2020-03-03 – 2020-03-04 (×2): 500 mg via ORAL
  Filled 2020-03-03 (×2): qty 1

## 2020-03-03 NOTE — ED Triage Notes (Signed)
Pt to ED via [facility] from home. Pt stating he went to bed last night feeling fine and woke up about 6:30-7:00am with blurred vision, trembling/weakness in both legs and arms, dizziness, LLQ pain and HA. Pt here 1 week  ago admitted for stroke.

## 2020-03-03 NOTE — Plan of Care (Signed)
  Problem: Education: Goal: Knowledge of disease or condition will improve Outcome: Progressing Goal: Knowledge of secondary prevention will improve Outcome: Progressing Goal: Knowledge of patient specific risk factors addressed and post discharge goals established will improve Outcome: Progressing Goal: Individualized Educational Video(s) Outcome: Progressing   

## 2020-03-03 NOTE — ED Provider Notes (Signed)
[facility] Emergency Department Provider Note   ____________________________________________   First MD Initiated Contact with Patient 03/03/20 [phone]     (approximate)  I have reviewed the triage vital signs and the nursing notes.   HISTORY  Chief Complaint Blurred Vision (poss stroke )    HPI GRAYLING SCHRANZ is a 86 y.o. male with past medical history of CAD, hypertension, CHF, CKD, and anemia who presents to the ED complaining of weakness.  Patient was recently admitted to the hospital for generalized weakness and found to have left occipital lobe intraparenchymal hemorrhage at that time.  He did not require intervention and was discharged 5 days ago in stable condition.  Patient states that he was feeling well until he woke up this morning.  He states that he started feeling dizzy and lightheaded when he got up with some blurry vision as well as diffuse numbness and weakness.  His blurry vision affected both eyes and has since improved, he also denies any focal numbness or weakness.  He does endorse onset of left lower quadrant abdominal pain with the weakness and dizziness this time.  He denies any recent nausea, vomiting, changes in bowel movements, dysuria, or hematuria.  He also states that his breathing feels off slightly since waking up, but he denies any fevers, cough, or chest pain.  He describes symptoms as similar to what he was admitted for last week.        Past Medical History:  Diagnosis Date   Anemia    Anxiety    Cardiomyopathy ([place])    CHF (congestive heart failure) (HCC)    Chronic kidney disease    kidney stones   Coronary artery disease    Cough    Dyspnea    Hypertension    Lymphadenopathy, hilar    MI (myocardial infarction) ([place])    x 2   Wheezing     Patient Active Problem List   Diagnosis Date Noted   Abdominal pain 03/03/2020   CVA (cerebral vascular accident) ([place]) 02/26/2020   Chronic cholecystitis  11/06/2019   Malnutrition of moderate degree 07/30/2019   Acute neutrophilia 07/30/2019   Sepsis ([place]) 07/29/2019   Acute cholecystitis 07/29/2019   Foot infection    Acute on chronic heart failure ([place]) 06/07/2019   Weakness    NSTEMI (non-ST elevated myocardial infarction) ([place])    Orthostatic hypotension 01/07/2019   Macrocytic anemia 08/05/2018   Anemia of chronic kidney failure, stage 3 (moderate) ([place]) 08/05/2018   BPH (benign prostatic hyperplasia) 07/24/2018   Gastroesophageal reflux disease with esophagitis 07/24/2018   Stage 3 chronic kidney disease ([place]) 07/24/2018   Thrombocytopenia ([place]) 07/24/2018   Primary osteoarthritis of both knees 08/22/2017   Medicare annual wellness visit, initial 41/32/4401   Chronic systolic heart failure ([place]) 01/17/2016   Chewing tobacco use 01/17/2016   Atrial fibrillation with RVR ([place]) 12/23/2015   Dyspnea on exertion 12/23/2015   Elevated troponin 12/23/2015   GERD (gastroesophageal reflux disease) 12/23/2015   Iron deficiency anemia due to chronic blood loss 12/20/2015   Femoral neck fracture, left, closed, initial encounter 11/03/2015   Chronic atrial fibrillation (HCC)    Coronary artery disease involving native coronary artery of native heart without angina pectoris    Acalculous cholecystitis 10/29/2015   B12 deficiency 08/03/2015   Erosive esophagitis 08/03/2015   Adenopathy    CAD (coronary artery disease) 12/22/2014   HTN (hypertension) 12/22/2014   Cardiomyopathy, ischemic 07/27/2014    Past Surgical History:  Procedure Laterality Date   BRONCHIAL NEEDLE ASPIRATION BIOPSY N/A 12/31/2014   Procedure: BRONCHIAL NEEDLE ASPIRATION BIOPSIES from [place]ina;  Surgeon: Flora Lipps, MD;  Location: [facility] ORS;  Service: Cardiopulmonary;  Laterality: N/A;   CARDIAC CATHETERIZATION N/A 12/28/2015   Procedure: Left Heart Cath and Cors/Grafts Angiography;  Surgeon: Yolonda Kida, MD;  Location: [place] [facility];  Service: Cardiovascular;  Laterality: N/A;   CATARACT EXTRACTION W/PHACO Left 08/01/2016   Procedure: CATARACT EXTRACTION PHACO AND INTRAOCULAR LENS PLACEMENT ([place]) Left;  Surgeon: Leandrew Koyanagi, MD;  Location: [place];  Service: Ophthalmology;  Laterality: Left;   COLON SURGERY     CORONARY ANGIOPLASTY WITH STENT PLACEMENT     CORONARY ARTERY BYPASS GRAFT     ENDOBRONCHIAL ULTRASOUND N/A 12/31/2014   Procedure: ENDOBRONCHIAL ULTRASOUND;  Surgeon: Flora Lipps, MD;  Location: [facility] ORS;  Service: Cardiopulmonary;  Laterality: N/A;   ESOPHAGOGASTRODUODENOSCOPY (EGD) WITH PROPOFOL N/A 06/20/2015   Procedure: ESOPHAGOGASTRODUODENOSCOPY (EGD) WITH PROPOFOL;  Surgeon: Lollie Sails, MD;  Location: [facility] ENDOSCOPY;  Service: Endoscopy;  Laterality: N/A;   IR CATHETER TUBE CHANGE  10/26/2019   IR CHOLANGIOGRAM EXISTING TUBE  08/13/2019    Prior to Admission medications   Medication Sig Start Date End Date Taking? Authorizing Provider  Ascorbic Acid (VITAMIN C PO) Take 1 tablet by mouth daily.    [provider]  aspirin 81 MG EC tablet Take 1 tablet (81 mg total) by mouth daily. RESUME 48HRS after surgery 11/06/19   Benjamine Sprague, DO  atorvastatin (LIPITOR) 40 MG tablet Take 1 tablet (40 mg total) by mouth daily with supper. 02/27/20   Enzo Bi, MD  digoxin (LANOXIN) 0.125 MG tablet Take 0.0625 mg by mouth daily.    [provider]  famotidine (PEPCID) 20 MG tablet Take 20 mg by mouth at bedtime. 02/04/19   [provider]  ferrous sulfate 325 (65 FE) MG tablet Take 1 tablet (325 mg total) by mouth every Monday, Wednesday, and Friday. With supper 02/29/20   Enzo Bi, MD  furosemide (LASIX) 40 MG tablet Take 0.5 tablets (20 mg total) by mouth daily. 02/27/20   Enzo Bi, MD  metoprolol tartrate (LOPRESSOR) 25 MG tablet Take 12.5 mg by mouth 2 (two) times daily.    [provider]  neomycin-bacitracin-polymyxin (NEOSPORIN)  ointment Apply 1 application topically every other day. Wound care (foot area)    [provider]  ranolazine (RANEXA) 1000 MG SR tablet Take 500 mg by mouth 2 (two) times daily.  01/16/19   [provider]  sucralfate (CARAFATE) 1 G tablet Take 1 g by mouth 2 (two) times daily.     [provider]  tamsulosin (FLOMAX) 0.4 MG CAPS capsule Take 0.4 mg by mouth daily after supper.     [provider]  vitamin B-12 (CYANOCOBALAMIN) 500 MCG tablet Take 500 mcg by mouth daily.    [provider]    Allergies Levaquin [levofloxacin], 5ht3 receptor antagonists, Diphenhydramine hcl, Escitalopram, Maxidex [dexamethasone], Prednisone, Serotonin, Vytorin [ezetimibe-simvastatin], Zocor [simvastatin], and Diltiazem  Family History  Problem Relation Age of Onset   CAD Mother    Colon cancer Father     Social History Social History   Tobacco Use   Smoking status: Former Smoker    Quit date: 06/27/1977    Years since quitting: 42.7   Smokeless tobacco: Current User    Types: Chew  Vaping Use   Vaping Use: Never used  Substance Use Topics   Alcohol  use: Yes    Comment: occational almost rare once a year   Drug use: No    Review of Systems  Constitutional: No fever/chills.  Positive for generalized weakness and lightheadedness. Eyes: Positive for visual changes. ENT: No sore throat. Cardiovascular: Denies chest pain. Respiratory: Positive for shortness of breath. Gastrointestinal: Positive for abdominal pain.  No nausea, no vomiting.  No diarrhea.  No constipation. Genitourinary: Negative for dysuria. Musculoskeletal: Negative for back pain. Skin: Negative for rash. Neurological: Negative for headaches, focal weakness or numbness.  ____________________________________________   PHYSICAL EXAM:  VITAL SIGNS: ED Triage Vitals  Enc Vitals Group     BP 03/03/20 0850 137/67     Pulse Rate 03/03/20 0850 (!) 57     Resp 03/03/20 0850 17       Temp 03/03/20 0850 97.6 F (36.4 C)     Temp Source 03/03/20 0850 Oral     SpO2 03/03/20 0850 100 %     Weight 03/03/20 0848 181 lb 6.4 oz (82.3 kg)     Height 03/03/20 0848 6\' 4"  (1.93 m)     Head Circumference --      Peak Flow --      Pain Score --      Pain Loc --      Pain Edu? --      Excl. in [place]? --     Constitutional: Alert and oriented. Eyes: Conjunctivae are normal.  Pupils equal round and reactive to light bilaterally, extraocular movements intact. Head: Atraumatic. Nose: No congestion/rhinnorhea. Mouth/Throat: Mucous membranes are moist. Neck: Normal ROM Cardiovascular: Normal rate, regular rhythm. Grossly normal heart sounds. Respiratory: Normal respiratory effort.  No retractions. Lungs CTAB. Gastrointestinal: Soft and diffusely tender to palpation with no rebound or guarding. No distention. Genitourinary: deferred Musculoskeletal: No lower extremity tenderness nor edema. Neurologic:  Normal speech and language. No gross focal neurologic deficits are appreciated. Skin:  Skin is warm, dry and intact. No rash noted. Psychiatric: Mood and affect are normal. Speech and behavior are normal.  ____________________________________________   LABS (all labs ordered are listed, but only abnormal results are displayed)  Labs Reviewed  CBC WITH DIFFERENTIAL/PLATELET - Abnormal; Notable for the following components:      Result Value   RBC 2.68 (*)    Hemoglobin 9.4 (*)    HCT 28.3 (*)    MCV 105.6 (*)    MCH 35.1 (*)    RDW 16.0 (*)    Platelets 111 (*)    Monocytes Absolute 3.2 (*)    Abs Immature Granulocytes 0.14 (*)    All other components within normal limits  COMPREHENSIVE METABOLIC PANEL - Abnormal; Notable for the following components:   Glucose, Bld 100 (*)    BUN 29 (*)    Creatinine, Ser 1.85 (*)    GFR, Estimated 35 (*)    All other components within normal limits  TROPONIN I (HIGH SENSITIVITY) - Abnormal; Notable for the following components:    Troponin I (High Sensitivity) 21 (*)    All other components within normal limits  RESPIRATORY PANEL BY RT PCR (FLU A&B, COVID)  LIPASE, BLOOD  URINALYSIS, COMPLETE (UACMP) WITH MICROSCOPIC  PATHOLOGIST SMEAR REVIEW  TROPONIN I (HIGH SENSITIVITY)   ____________________________________________  EKG  ED ECG REPORT I, Blake Divine, the attending physician, personally viewed and interpreted this ECG.   Date: 03/03/2020  EKG Time: 8:59  Rate: 67  Rhythm: normal sinus rhythm  Axis: Normal  Intervals:nonspecific intraventricular conduction delay  ST&T  Change: Inferior ST depressions, new compared to previous   PROCEDURES  Procedure(s) performed (including Critical Care):  Procedures   ____________________________________________   INITIAL IMPRESSION / ASSESSMENT AND PLAN / ED COURSE       86 year old male with past medical history of CAD, hypertension, CHF, CKD, anemia, and recent small left occipital lobe intraparenchymal hemorrhage presents to the ED complaining of dizziness, lightheadedness, generalized weakness, and blurry vision starting this morning.  He does not have any focal neurologic deficits on exam and states his vision is now back to normal.  Given lack of focal deficits, no indication for code stroke at this time and he would not be a TPA candidate given his recent intracranial hemorrhage.  No findings to suggest a large vessel occlusion.  I would consider cranial etiology for his symptoms as well as more of a syncopal type process, electrolyte abnormality, or infectious process.  We will check CT head as well as CT abdomen/pelvis, labs including LFTs and lipase, UA.  He also reports some mild difficulty breathing, but is not in any respiratory distress, maintaining O2 sats on room air.  We will check chest x-ray and troponin, as EKG does show some mild ST depression inferiorly.  CT head negative for acute process, no obvious hemorrhage by my read.  CT abdomen/pelvis  is also unremarkable.  Lab work is unremarkable, shows chronic anemia similar to previous.  Chest x-ray is also unremarkable, no infiltrate or edema by my read.  Given near syncopal episode with EKG changes, case discussed with hospitalist for admission for further syncopal work-up.      ____________________________________________   FINAL CLINICAL IMPRESSION(S) / ED DIAGNOSES  Final diagnoses:  Near syncope  Generalized weakness     ED Discharge Orders    None       Note:  This document was prepared using Dragon voice recognition software and may include unintentional dictation errors.   Blake Divine, MD 03/03/20 1123

## 2020-03-03 NOTE — H&P (Signed)
History and Physical    Andres Gutierrez RUE:[id number] DOB: 03/14/1931 DOA: 03/03/2020  Referring MD/NP/PA:   PCP: Rusty Aus, MD   Patient coming from:  The patient is coming from home.  At baseline, pt is partially dependent for most of ADL.        Chief Complaint: blurry vision and tingling in extremities  HPI: Andres Gutierrez is a 86 y.o. male with medical history significant of recent hemorrhagic stroke, hypertension, hyperlipidemia, GERD, CAD, thrombocytopenia, myocardial infarction, s/p of stent placement, CKD stage III, anemia,sCHF with EF 35-40%, daily tobacco, atrial fibrillation not on anticoagulants, anxiety, who presents with blurry vision and tingling in extremities.  Pt was recently hospitalized from 10/15-10/16 due to subacute small focus of left occipital intraparenchymal hemorrhage. Patient states that he went to bed last night feeling fine and woke up about 6:30-7:00 am with blurred vision in both eyes and tingling in all extremities.  No weakness in extremities.  No facial droop, slurred speech or difficulty swallowing.  He also complains of dizziness and headache.  Per his daughter, patient lost peripheral vision on both sides transiently.  Patient also reports left lower quadrant abdominal pain, which is mild, sharp, nonradiating.  Denies nausea, vomiting, diarrhea.  No symptoms of UTI or hematuria.  Patient does not have chest pain, shortness breath, cough, fever or chills.  ED Course: pt was found to have troponin 21, WBC 9.3, lipase 32, renal function close to baseline, temperature normal, blood pressure 137/87, heart rate 57, RR 17, oxygen saturation 100%. MRI of brain is negative for acute issues, but showed sequela of cerebral amyloid angiopathy and prior left occipital Hemorrhage. Patient is placed on MedSurg bed for observation. I discussed with the neurologist, Dr. Cheral Marker. Consulted urologist, Dr. Diamantina Providence. CT-abd/pelvis: 1. No new or acute abdominopelvic findings.  Interval cholecystectomy. 2. Trace amount of free fluid in the pelvis, nonspecific. 3. 2-3 mm stone within the distal left ureter is unchanged from prior study without associated hydroureteronephrosis or periureteral stranding. 4. Moderate volume of stool throughout the colon. 5. Aortic atherosclerosis.  (ICD10-I70.0).      Review of Systems:   General: no fevers, chills, no body weight gain, has fatigue HEENT: has blurry vision, no hearing changes or sore throat Respiratory: no dyspnea, coughing, wheezing CV: no chest pain, no palpitations GI: no nausea, vomiting, has LLQ abdominal pain, no diarrhea, constipation GU: no dysuria, burning on urination, increased urinary frequency, hematuria  Ext: no leg edema Neuro: has dizziness, tingling in extremities Skin: no rash, no skin tear. MSK: No muscle spasm, no deformity, no limitation of range of movement in spin Heme: No easy bruising.  Travel history: No recent long distant travel.  Allergy:  Allergies  Allergen Reactions  . Levaquin [Levofloxacin]     [name], her daughter, said Levaquin should be avoided because when patient had Levaquin a few years ago, he had "a bad reaction".  She said he could not walk and could not lift up his feet.  . Escitalopram Other (See Comments)    Reaction:  Makes him feel faint, like he was going to have a heart attack.  . 5ht3 Receptor Antagonists   . Diltiazem Rash  . Diphenhydramine Hcl Other (See Comments)    Reaction:  Rash and fever a long time ago, but has had it since with no problem.  . Maxidex [Dexamethasone] Other (See Comments)    Reaction:  Unknown   . Prednisone Other (See Comments)    Pt states  that this medication made him feel crazy.    . Serotonin Other (See Comments)    Tried 2 different types, lexapro and another one.  Reaction:  Made him feel like he was having a heart attack.   . Vytorin [Ezetimibe-Simvastatin] Other (See Comments)    Reaction:  Unknown   . Zocor  [Simvastatin] Other (See Comments)    Reaction:  Unknown     Past Medical History:  Diagnosis Date  . Anemia   . Anxiety   . Cardiomyopathy ([place])   . CHF (congestive heart failure) ([place])   . Chronic kidney disease    kidney stones  . Coronary artery disease   . Cough   . Dyspnea   . Hypertension   . Lymphadenopathy, hilar   . MI (myocardial infarction) ([place])    x 2  . Wheezing     Past Surgical History:  Procedure Laterality Date  . BRONCHIAL NEEDLE ASPIRATION BIOPSY N/A 12/31/2014   Procedure: BRONCHIAL NEEDLE ASPIRATION BIOPSIES from carina;  Surgeon: Flora Lipps, MD;  Location: [facility];  Service: Cardiopulmonary;  Laterality: N/A;  . CARDIAC CATHETERIZATION N/A 12/28/2015   Procedure: Left Heart Cath and Cors/Grafts Angiography;  Surgeon: Yolonda Kida, MD;  Location: [facility];  Service: Cardiovascular;  Laterality: N/A;  . CATARACT EXTRACTION W/PHACO Left 08/01/2016   Procedure: CATARACT EXTRACTION PHACO AND INTRAOCULAR LENS PLACEMENT ([place]) Left;  Surgeon: Leandrew Koyanagi, MD;  Location: [facility];  Service: Ophthalmology;  Laterality: Left;  . COLON SURGERY    . CORONARY ANGIOPLASTY WITH STENT PLACEMENT    . CORONARY ARTERY BYPASS GRAFT    . ENDOBRONCHIAL ULTRASOUND N/A 12/31/2014   Procedure: ENDOBRONCHIAL ULTRASOUND;  Surgeon: Flora Lipps, MD;  Location: [facility];  Service: Cardiopulmonary;  Laterality: N/A;  . ESOPHAGOGASTRODUODENOSCOPY (EGD) WITH PROPOFOL N/A 06/20/2015   Procedure: ESOPHAGOGASTRODUODENOSCOPY (EGD) WITH PROPOFOL;  Surgeon: Lollie Sails, MD;  Location: [facility]OS[facility];  Service: Endoscopy;  Laterality: N/A;  . IR CATHETER TUBE CHANGE  10/26/2019  . IR CHOLANGIOGRAM EXISTING TUBE  08/13/2019    Social History:  reports that he quit smoking about 42 years ago. His smokeless tobacco use includes chew. He reports current alcohol use. He reports that he does not use drugs.  Family History:  Family History  Problem Relation Age  of Onset  . CAD Mother   . Colon cancer Father      Prior to Admission medications   Medication Sig Start Date End Date Taking? Authorizing Provider  Ascorbic Acid (VITAMIN C PO) Take 1 tablet by mouth daily.    [provider]  aspirin 81 MG EC tablet Take 1 tablet (81 mg total) by mouth daily. RESUME 48HRS after surgery 11/06/19   Benjamine Sprague, DO  atorvastatin (LIPITOR) 40 MG tablet Take 1 tablet (40 mg total) by mouth daily with supper. 02/27/20   Enzo Bi, MD  digoxin (LANOXIN) 0.125 MG tablet Take 0.0625 mg by mouth daily.    [provider]  famotidine (PEPCID) 20 MG tablet Take 20 mg by mouth at bedtime. 02/04/19   [provider]  ferrous sulfate 325 (65 FE) MG tablet Take 1 tablet (325 mg total) by mouth every Monday, Wednesday, and Friday. With supper 02/29/20   Enzo Bi, MD  furosemide (LASIX) 40 MG tablet Take 0.5 tablets (20 mg total) by mouth daily. 02/27/20   Enzo Bi, MD  metoprolol tartrate (LOPRESSOR) 25 MG tablet Take 12.5 mg by mouth 2 (two) times daily.  [provider]  neomycin-bacitracin-polymyxin (NEOSPORIN) ointment Apply 1 application topically every other day. Wound care (foot area)    [provider]  ranolazine (RANEXA) 1000 MG SR tablet Take 500 mg by mouth 2 (two) times daily.  01/16/19   [provider]  sucralfate (CARAFATE) 1 G tablet Take 1 g by mouth 2 (two) times daily.     [provider]  tamsulosin (FLOMAX) 0.4 MG CAPS capsule Take 0.4 mg by mouth daily after supper.     [provider]  vitamin B-12 (CYANOCOBALAMIN) 500 MCG tablet Take 500 mcg by mouth daily.    [provider]    Physical Exam: Vitals:   03/03/20 1105 03/03/20 1215 03/03/20 1230 03/03/20 1710  BP: (!) 129/56 (!) 131/53 (!) 122/53 (!) 115/58  Pulse: 61 64 (!) 59 62  Resp: 17 (!) 22 17 18   Temp:    98.9 F (37.2 C)  TempSrc:    Oral  SpO2: 100% 100% 99% 100%  Weight:      Height:        General: Not in acute distress HEENT:       Eyes: PERRL, EOMI, no scleral icterus.       ENT: No discharge from the ears and nose, no pharynx injection, no tonsillar enlargement.        Neck: No JVD, no bruit, no mass felt. Heme: No neck lymph node enlargement. Cardiac: S1/S2, RRR, No murmurs, No gallops or rubs. Respiratory:  No rales, wheezing, rhonchi or rubs. GI: Soft, nondistended, has mild tenderness in LLQ, no rebound pain, no organomegaly, BS present. GU: No hematuria Ext: No pitting leg edema bilaterally. 2+DP/PT pulse bilaterally. Musculoskeletal: No joint deformities, No joint redness or warmth, no limitation of ROM in spin. Skin: No rashes.  Neuro: Alert, oriented X3, cranial nerves II-XII grossly intact, moves all extremities normally. Muscle strength 5/5 in all extremities, sensation to light touch intact. Brachial reflex 2+ bilaterally.  Psych: Patient is not psychotic, no suicidal or hemocidal ideation.  Labs on Admission: I have personally reviewed following labs and imaging studies  CBC: Recent Labs  Lab 02/26/20 0542 02/27/20 1256 03/03/20 0858  WBC 9.8 9.6 9.3  NEUTROABS  --   --  4.0  HGB 9.2* 9.3* 9.4*  HCT 27.5* 27.8* 28.3*  MCV 105.4* 105.7* 105.6*  PLT 121* 125* 388*   Basic Metabolic Panel: Recent Labs  Lab 02/26/20 0542 02/27/20 1256 03/03/20 0858  NA 137 136 138  K 4.4 3.8 4.4  CL 102 102 105  CO2 25 25 22   GLUCOSE 101* 117* 100*  BUN 30* 23 29*  CREATININE 1.77* 1.51* 1.85*  CALCIUM 9.1 9.2 9.2  MG  --  1.8  --    GFR: Estimated Creatinine Clearance: 33.4 mL/min (A) (by C-G formula based on SCr of 1.85 mg/dL (H)). Liver Function Tests: Recent Labs  Lab 02/26/20 0542 03/03/20 0858  AST 19 20  ALT 17 16  ALKPHOS 55 57  BILITOT 0.9 1.1  PROT 7.5 7.7  ALBUMIN 4.3 4.4   Recent Labs  Lab 03/03/20 0858  LIPASE 32   No results for input(s): AMMONIA in the last 168 hours. Coagulation Profile: Recent Labs  Lab 03/03/20 1728   INR 1.1   Cardiac Enzymes: No results for input(s): CKTOTAL, CKMB, CKMBINDEX, TROPONINI in the last 168 hours. BNP (last 3 results) No results for input(s): PROBNP in the last 8760 hours. HbA1C: No results for input(s): HGBA1C in the last 72  hours. CBG: No results for input(s): GLUCAP in the last 168 hours. Lipid Profile: No results for input(s): CHOL, HDL, LDLCALC, TRIG, CHOLHDL, LDLDIRECT in the last 72 hours. Thyroid Function Tests: No results for input(s): TSH, T4TOTAL, FREET4, T3FREE, THYROIDAB in the last 72 hours. Anemia Panel: No results for input(s): VITAMINB12, FOLATE, FERRITIN, TIBC, IRON, RETICCTPCT in the last 72 hours. Urine analysis:    Component Value Date/Time   COLORURINE YELLOW (A) 02/26/2020 1250   APPEARANCEUR CLEAR (A) 02/26/2020 1250   LABSPEC 1.011 02/26/2020 1250   PHURINE 5.0 02/26/2020 1250   GLUCOSEU NEGATIVE 02/26/2020 1250   HGBUR NEGATIVE 02/26/2020 1250   BILIRUBINUR NEGATIVE 02/26/2020 1250   KETONESUR NEGATIVE 02/26/2020 1250   PROTEINUR NEGATIVE 02/26/2020 1250   UROBILINOGEN 0.2 09/11/2010 0647   NITRITE NEGATIVE 02/26/2020 1250   LEUKOCYTESUR NEGATIVE 02/26/2020 1250   Sepsis Labs: @LABRCNTIP (procalcitonin:4,lacticidven:4) ) Recent Results (from the past 240 hour(s))  Respiratory Panel by RT PCR (Flu A&B, Covid) - Nasopharyngeal Swab     Status: None   Collection Time: 02/26/20  5:42 AM   Specimen: Nasopharyngeal Swab  Result Value Ref Range Status   SARS Coronavirus 2 by RT PCR NEGATIVE NEGATIVE Final    Comment: (NOTE) SARS-CoV-2 target nucleic acids are NOT DETECTED.  The SARS-CoV-2 RNA is generally detectable in upper respiratoy specimens during the acute phase of infection. The lowest concentration of SARS-CoV-2 viral copies this assay can detect is 131 copies/mL. A negative result does not preclude SARS-Cov-2 infection and should not be used as the sole basis for treatment or other patient management decisions. A negative  result may occur with  improper specimen collection/handling, submission of specimen other than nasopharyngeal swab, presence of viral mutation(s) within the areas targeted by this assay, and inadequate number of viral copies (<131 copies/mL). A negative result must be combined with clinical observations, patient history, and epidemiological information. The expected result is Negative.  Fact Sheet for Patients:  PinkCheek.be  Fact Sheet for Healthcare Providers:  GravelBags.it  This test is no t yet approved or cleared by the [place] FDA and  has been authorized for detection and/or diagnosis of SARS-CoV-2 by FDA under an Emergency Use Authorization (EUA). This EUA will remain  in effect (meaning this test can be used) for the duration of the COVID-19 declaration under Section 564(b)(1) of the Act, 21 U.S.C. section 360bbb-3(b)(1), unless the authorization is terminated or revoked sooner.     Influenza A by PCR NEGATIVE NEGATIVE Final   Influenza B by PCR NEGATIVE NEGATIVE Final    Comment: (NOTE) The Xpert Xpress SARS-CoV-2/FLU/RSV assay is intended as an aid in  the diagnosis of influenza from Nasopharyngeal swab specimens and  should not be used as a sole basis for treatment. Nasal washings and  aspirates are unacceptable for Xpert Xpress SARS-CoV-2/FLU/RSV  testing.  Fact Sheet for Patients: PinkCheek.be  Fact Sheet for Healthcare Providers: GravelBags.it  This test is not yet approved or cleared by the [place] FDA and  has been authorized for detection and/or diagnosis of SARS-CoV-2 by  FDA under an Emergency Use Authorization (EUA). This EUA will remain  in effect (meaning this test can be used) for the duration of the  Covid-19 declaration under Section 564(b)(1) of the Act, 21  U.S.C. section 360bbb-3(b)(1), unless the authorization is    terminated or revoked. Performed at [facility], [address] [place], [place] [address]   Respiratory Panel by RT PCR (Flu A&B, Covid) - Nasopharyngeal Swab  Status: None   Collection Time: 03/03/20 11:22 AM   Specimen: Nasopharyngeal Swab  Result Value Ref Range Status   SARS Coronavirus 2 by RT PCR NEGATIVE NEGATIVE Final    Comment: (NOTE) SARS-CoV-2 target nucleic acids are NOT DETECTED.  The SARS-CoV-2 RNA is generally detectable in upper respiratoy specimens during the acute phase of infection. The lowest concentration of SARS-CoV-2 viral copies this assay can detect is 131 copies/mL. A negative result does not preclude SARS-Cov-2 infection and should not be used as the sole basis for treatment or other patient management decisions. A negative result may occur with  improper specimen collection/handling, submission of specimen other than nasopharyngeal swab, presence of viral mutation(s) within the areas targeted by this assay, and inadequate number of viral copies (<131 copies/mL). A negative result must be combined with clinical observations, patient history, and epidemiological information. The expected result is Negative.  Fact Sheet for Patients:  PinkCheek.be  Fact Sheet for Healthcare Providers:  GravelBags.it  This test is no t yet approved or cleared by the [place] FDA and  has been authorized for detection and/or diagnosis of SARS-CoV-2 by FDA under an Emergency Use Authorization (EUA). This EUA will remain  in effect (meaning this test can be used) for the duration of the COVID-19 declaration under Section 564(b)(1) of the Act, 21 U.S.C. section 360bbb-3(b)(1), unless the authorization is terminated or revoked sooner.     Influenza A by PCR NEGATIVE NEGATIVE Final   Influenza B by PCR NEGATIVE NEGATIVE Final    Comment: (NOTE) The Xpert Xpress SARS-CoV-2/FLU/RSV assay is  intended as an aid in  the diagnosis of influenza from Nasopharyngeal swab specimens and  should not be used as a sole basis for treatment. Nasal washings and  aspirates are unacceptable for Xpert Xpress SARS-CoV-2/FLU/RSV  testing.  Fact Sheet for Patients: PinkCheek.be  Fact Sheet for Healthcare Providers: GravelBags.it  This test is not yet approved or cleared by the [place] FDA and  has been authorized for detection and/or diagnosis of SARS-CoV-2 by  FDA under an Emergency Use Authorization (EUA). This EUA will remain  in effect (meaning this test can be used) for the duration of the  Covid-19 declaration under Section 564(b)(1) of the Act, 21  U.S.C. section 360bbb-3(b)(1), unless the authorization is  terminated or revoked. Performed at [facility], [address], [place], [place] [address]      Radiological Exams on Admission: DG Chest 2 View  Result Date: 03/03/2020 CLINICAL DATA:  Shortness of breath. EXAM: CHEST - 2 VIEW COMPARISON:  02/26/2020 FINDINGS: Sequelae of CABG are again identified. The cardiomediastinal silhouette is unchanged with upper limits of normal heart size. The lungs are hyperinflated. Interstitial type densities in the left greater than right lung bases have increased. No pleural effusion or pneumothorax is identified. A thin walled cavitary lesion is again noted in the left lung apex, more fully evaluated on 01/06/2020 CT. No acute osseous abnormality is seen. IMPRESSION: Increased bibasilar opacities which may reflect increased atelectasis or infection. Electronically Signed   By: Logan Bores M.D.   On: 03/03/2020 09:38   CT Head Wo Contrast  Result Date: 03/03/2020 CLINICAL DATA:  Intracranial hemorrhage, follow-up EXAM: CT HEAD WITHOUT CONTRAST TECHNIQUE: Contiguous axial images were obtained from the base of the skull through the vertex without intravenous contrast.  COMPARISON:  02/27/2020 FINDINGS: Brain: There is no definite hyperdense hemorrhage present. There is no mass effect or edema. No new loss of gray-white differentiation. Stable  findings of chronic microvascular ischemic changes. Ventricles are stable in size. Vascular: There is atherosclerotic calcification at the skull base. Skull: Calvarium is unremarkable. Sinuses/Orbits: No acute finding. Other: None. IMPRESSION: No definite left occipital hemorrhage. No new hemorrhage or other acute abnormality. Electronically Signed   By: Macy Mis M.D.   On: 03/03/2020 10:17   MR BRAIN WO CONTRAST  Result Date: 03/03/2020 CLINICAL DATA:  Transit ischemic attack. EXAM: MRI HEAD WITHOUT CONTRAST TECHNIQUE: Multiplanar, multiecho pulse sequences of the brain and surrounding structures were obtained without intravenous contrast. COMPARISON:  03/03/2020 head CT.  02/26/2020 MRI/MRA head. FINDINGS: Brain: No diffusion-weighted signal abnormality. No new intracranial hemorrhage. No midline shift, ventriculomegaly or extra-axial fluid collection. No mass lesion. Mild cerebral atrophy. Moderate chronic microvascular ischemic changes. Scattered remote bilateral cerebral and cerebellar microhemorrhages. Sequela of prior left occipital insult. Vascular: Normal flow voids. Skull and upper cervical spine: Normal marrow signal. Sinuses/Orbits: Sequela of bilateral lens replacement. Clear paranasal sinuses. Trace right mastoid effusion. Other: None. IMPRESSION: No acute intracranial process. Sequela of cerebral amyloid angiopathy and prior left occipital hemorrhage. Mild cerebral atrophy and moderate chronic microvascular ischemic changes. Electronically Signed   By: Primitivo Gauze M.D.   On: 03/03/2020 14:17   CT Abdomen Pelvis W Contrast  Result Date: 03/03/2020 CLINICAL DATA:  Left lower quadrant abdominal pain EXAM: CT ABDOMEN AND PELVIS WITH CONTRAST TECHNIQUE: Multidetector CT imaging of the abdomen and pelvis was  performed using the standard protocol following bolus administration of intravenous contrast. CONTRAST:  37mL OMNIPAQUE IOHEXOL 300 MG/ML  SOLN COMPARISON:  07/29/2019 FINDINGS: Lower chest: Mild bibasilar atelectasis. Chronic right pleural thickening with associated calcified pleural plaque again noted. Heart size is upper limits of normal. Hepatobiliary: No focal liver lesion is identified. Interval cholecystectomy. No biliary dilatation. Pancreas: Unremarkable. No pancreatic ductal dilatation or surrounding inflammatory changes. Spleen: Normal in size without focal abnormality. Adrenals/Urinary Tract: Unremarkable adrenal glands. Stable appearance of bilateral renal cysts. No hydronephrosis. Previously seen right sided renal stone is no longer identified. No right ureteral calculi. 2-3 mm stone within the distal left ureter (series 2, image 70) is unchanged from prior without associated hydroureteronephrosis or periureteral stranding. Urinary bladder within normal limits. Stomach/Bowel: Small hiatal hernia. Stomach otherwise unremarkable. No dilated loops of small bowel. Postsurgical changes to the cecum with prior appendectomy. Moderate volume of stool throughout the colon. No focal bowel wall thickening or inflammatory changes. Vascular/Lymphatic: Aortic atherosclerosis. No enlarged abdominal or pelvic lymph nodes. Reproductive: Prostate is unremarkable. Other: Trace amount of free fluid in the pelvis, nonspecific. No abdominopelvic fluid collection. No pneumoperitoneum. No abdominal wall hernia. Musculoskeletal: Degenerative changes of the spine and bilateral hips, not appreciably changed from prior. No new or acute osseous findings. IMPRESSION: 1. No new or acute abdominopelvic findings. Interval cholecystectomy. 2. Trace amount of free fluid in the pelvis, nonspecific. 3. 2-3 mm stone within the distal left ureter is unchanged from prior study without associated hydroureteronephrosis or periureteral  stranding. 4. Moderate volume of stool throughout the colon. 5. Aortic atherosclerosis.  (ICD10-I70.0). Electronically Signed   By: Davina Poke D.O.   On: 03/03/2020 10:25     EKG: I have personally reviewed.  Sinus rhythm, QTC 476, poor R wave progression, anteroseptal infarction pattern, ST depression in inferior leads and V6.   Assessment/Plan Principal Problem:   Blurry vision Active Problems:   CAD (coronary artery disease)   HTN (hypertension)   Chronic atrial fibrillation (HCC)   Elevated troponin   GERD (gastroesophageal reflux disease)  Chronic systolic heart failure (HCC)   Macrocytic anemia   Thrombocytopenia (HCC)   CVA (cerebral vascular accident) ([place])   Abdominal pain   HLD (hyperlipidemia)   CKD (chronic kidney disease), stage IIIa   Left ureteral stone   Blurry vision: Patient has bilateral blurry vision, tingling in all extremities.  Etiology is not clear.  MRI of brain is negative for stroke.  I discussed with Dr. Cheral Marker of neurology, who recommended to check vitamin B1, vitamin B12 level  -Placed on MedSurg bed for observation -continue ASA and Lipitor -Check vitamin B1, vitamin B12, TSH - PT/OT consult  CAD and elevated trop: No chest pain. Trop 21.  Likely due to demand ischemia.  A1c 5.1 and LDL 38 on 02/27/2020. -ASA and Lipitor -Ranexa -Trend troponin -Repeat EKG in morning.  HTN (hypertension) -IV hydralazine as needed -Metoprolol -Patient is on Lasix  Chronic atrial fibrillation [facility]): Patient is not taking anticoagulants -Continue metoprolol  GERD (gastroesophageal reflux disease) -Pepcid  Chronic systolic heart failure ([place]): 2D echo on 02/27/2020 showed EF 35--40%.  Patient does not have leg edema.  No respiratory distress.  CHF seem to be compensated. -Continue home Lasix 20 mg daily -Check BNP --> 293  Macrocytic anemia: Hemoglobin stable -Continue home iron supplement  Thrombocytopenia ([place]): This is chronic  issue. -Follow-up with CBC  History of CVA (cerebral vascular accident) [facility]): -On Lipitor and aspirin  Abdominal pain and left distal ureteral stone: CT showed a 2-3 mm stone within the distal left ureter which is unchanged from prior study without associated hydroureteronephrosis or periureteral stranding. I consulted Dr. Diamantina Providence of her urologist.  Dr. Diamantina Providence reviewed chart and image. Per. Dr. Diamantina Providence, it looks like the stone has been there since at least 03/2019, with stable kidney function and no hydronephrosis.  No intervention needed at this moment.  Dr. Diamantina Providence will add patient to his clinic schedule for next week.  HLD (hyperlipidemia) -lipitor  CKD (chronic kidney disease), stage IIIa: Close to baseline.  Recent baseline creatinine 1.5-1.8.  His creatinine is 1.85, BUN 29. -f/u by BMP     DVT ppx: SCD Code Status: Full code Family Communication:    Yes, patient's daughter   at bed side Disposition Plan:  Anticipate discharge back to previous environment Consults called: Discussed with Dr. Cheral Marker of neurology Admission status: Med-surg bed for obs   Status is: Observation  The patient remains OBS appropriate and will d/c before 2 midnights.  Dispo: The patient is from: Home              Anticipated d/c is to: Home              Anticipated d/c date is: 1 day              Patient currently is not medically stable to d/c.           Date of Service 03/03/2020    [facility]   If 7PM-7AM, please contact night-coverage [email or web address] 03/03/2020, 6:10 PM

## 2020-03-03 NOTE — ED Notes (Addendum)
Pt transported to xray 

## 2020-03-04 ENCOUNTER — Telehealth: Payer: Self-pay | Admitting: Urology

## 2020-03-04 DIAGNOSIS — H538 Other visual disturbances: Secondary | ICD-10-CM | POA: Diagnosis not present

## 2020-03-04 LAB — DIGOXIN LEVEL: Digoxin Level: 0.9 ng/mL — ABNORMAL LOW (ref 1.0–2.0)

## 2020-03-04 LAB — TSH: TSH: 4.36 u[IU]/mL (ref 0.350–4.500)

## 2020-03-04 MED ORDER — FUROSEMIDE 20 MG PO TABS
20.0000 mg | ORAL_TABLET | Freq: Every day | ORAL | 0 refills | Status: DC | PRN
Start: 2020-03-04 — End: 2020-10-04

## 2020-03-04 MED ORDER — ENSURE ENLIVE PO LIQD: 237.0000 mL | Freq: Two times a day (BID) | ORAL | 0 refills | Status: DC

## 2020-03-04 MED ORDER — ENSURE ENLIVE PO LIQD: 237.0000 mL | Freq: Two times a day (BID) | ORAL | Status: DC

## 2020-03-04 NOTE — Evaluation (Signed)
Physical Therapy Evaluation Patient Details Name: Andres Gutierrez MRN: [id number] DOB: 1931-01-09 Today's Date: 03/04/2020   History of Present Illness  Andres Gutierrez is an 86 y/o male who was admitted with chief complaints of blurry vision with tingling in extremities with c/o dizziness and headache. MRI negative for acute processes. Pt with recent admission from 10/15-10/16 for transient loss of taste and gen weakness. PMH inlcudes: recent hemorrhagic stroke, HTN, HLD, CAD, thrombocytopenia, MI x 2, s/p stent placement, CKD stage III, anemia, sCHF w/ EF 35-40%, daily tobacco, A-fib not on anticoagulants, and anxiety.    Clinical Impression  Pt received lying in bed with daughter present at bedside. Pt agreeable to PT evaluation. Pt performed therex in bed and RR noted to increase to 35 breaths/minute. RR reduced to low 20s with rest. Pt performed bed mobility mod I for increased time coming into long sitting. Pt sat edge of bed and demonstrated normal sitting balance as he was able to reach outside of BOS with no LOB. Pt performed sit <> stand transfers with supervision for safety however no external physical assistance needed. Pt ambulated two laps around nurses station using RW initially with CGA for safety then supervision only with no LOB noted throughout ambulation distance. HR remained WNL. Verbal cues for remaining closer to RW 1-2x during ambulation distance. Pt reporting peripheral vision has returned and was correctly able to identify 5/7 times of clinician holding up fingers. Pt and daughter reporting that pt is currently at baseline level of function. No PT needs identified at this time. Should pt have a decline in functional mobility or change in status, please re-consult. Will sign off at this time.  This entire session was guided, instructed, and directly supervised by Leitha Bleak, MPT.     Follow Up Recommendations No PT follow up    Equipment Recommendations  Other  (comment);None recommended by PT (pt has all equipment currently utilized)    Recommendations for Other Services       Precautions / Restrictions Precautions Precautions: Fall Precaution Comments: low fall risk Required Braces or Orthoses: Other Brace Other Brace: pt has surgical shoe for L foot for foot ulcers per daughter Restrictions Weight Bearing Restrictions: No      Mobility  Bed Mobility Overal bed mobility: Needs Assistance Bed Mobility: Supine to Sit     Supine to sit: Supervision     General bed mobility comments: pt able to come into long sitting with increased time however with no difficulties swiveling hips to edge of bed    Transfers Overall transfer level: Modified independent Equipment used: Rolling walker (2 wheeled) Transfers: Sit to/from Stand Sit to Stand: Supervision         General transfer comment: Supervision for safety however pt with no LOB or buckling noted.  Ambulation/Gait Ambulation/Gait assistance: Supervision;Min guard Gait Distance (Feet): 420 Feet Assistive device: Rolling walker (2 wheeled) Gait Pattern/deviations: Step-through pattern Gait velocity: 10' in 10"   General Gait Details: Pt with good cadence and equal step lengths bilaterally; occasional verbal cue to remain closer to [employer] Mobility    Modified Rankin (Stroke Patients Only)       Balance Overall balance assessment: Modified Independent Sitting-balance support: No upper extremity supported;Feet supported Sitting balance-[name]: Normal Sitting balance - Comments: pt reaching outside of BOS and able to don socks independently sitting edge of bed   Standing balance support: Bilateral upper  extremity supported;During functional activity Standing balance-[name]: Good Standing balance comment: good standing balance; CGA initially then progressed to supervision only                              Pertinent  Vitals/Pain Pain Assessment: No/denies pain    Home Living Family/patient expects to be discharged to:: Private residence Living Arrangements: Alone Available Help at Discharge: Family;Available PRN/intermittently Type of Home: House Home Access: Stairs to enter Entrance Stairs-Rails: Right Entrance Stairs-Number of Steps: 2 Home Layout: One level Home Equipment: Walker - 2 wheels;Cane - single point;Bedside commode;Shower seat;Grab bars - tub/shower      Prior Function Level of Independence: Independent with assistive device(s)   Gait / Transfers Assistance Needed: pt ambulates using RW at baseline  ADL's / Homemaking Assistance Needed: I with (B)ADLs.  Children assist with all community tasks. Pt reported he does not drive, family assists with cooking/cleaning as needed  Comments: Pt denies fall history in the past 6 months.     Hand Dominance   Dominant Hand: Right    Extremity/Trunk Assessment   Upper Extremity Assessment Upper Extremity Assessment: Overall WFL for tasks assessed    Lower Extremity Assessment Lower Extremity Assessment: Overall WFL for tasks assessed    Cervical / Trunk Assessment Cervical / Trunk Assessment: Normal  Communication   Communication: HOH  Cognition Arousal/Alertness: Awake/alert Behavior During Therapy: WFL for tasks assessed/performed Overall Cognitive Status: Within Functional Limits for tasks assessed                                 General Comments: A&O x 4      General Comments      Exercises Other Exercises Other Exercises: pt performed hip ab/add, heel slides, and SLR x 10 bilaterally   Assessment/Plan    PT Assessment Patent does not need any further PT services  PT Problem List         PT Treatment Interventions      PT Goals (Current goals can be found in the Care Plan section)  Acute Rehab PT Goals Patient Stated Goal: none stated PT Goal Formulation: With patient Time For Goal  Achievement: 03/04/20 Potential to Achieve Goals: Good    Frequency     Barriers to discharge        Co-evaluation               AM-PAC PT "6 Clicks" Mobility  Outcome Measure Help needed turning from your back to your side while in a flat bed without using bedrails?: None Help needed moving from lying on your back to sitting on the side of a flat bed without using bedrails?: None Help needed moving to and from a bed to a chair (including a wheelchair)?: None Help needed standing up from a chair using your arms (e.g., wheelchair or bedside chair)?: None Help needed to walk in hospital room?: None Help needed climbing 3-5 steps with a railing? : A Little 6 Click Score: 23    End of Session Equipment Utilized During Treatment: Gait belt Activity Tolerance: Patient tolerated treatment well Patient left: in chair;with call bell/phone within reach;with family/visitor present Nurse Communication: Mobility status PT Visit Diagnosis: Other abnormalities of gait and mobility (R26.89);Muscle weakness (generalized) (M62.81)    Time: 1191-4782 PT Time Calculation (min) (ACUTE ONLY): 31 min   Charges:  Vale [place], SPT  Husam Hohn 03/04/2020, 1:52 PM

## 2020-03-04 NOTE — Progress Notes (Signed)
SLP Cancellation Note  Patient Details Name: [name] MRN: [id number] DOB: 1930/05/16   Cancelled treatment:       Reason Eval/Treat Not Completed: SLP screened, no needs identified, will sign off   Chart reviewed, pt screened with no acute deficits identified. ST evaluation and intervention is not indicated.   Miller Limehouse B. Rutherford Nail M.S., CCC-SLP, [place] Office [phone]   Stormy Fabian 03/04/2020, 9:47 AM

## 2020-03-04 NOTE — TOC Initial Note (Addendum)
Transition of Care [facility]) - Initial/Assessment Note    Patient Details  Name: Andres Gutierrez MRN: [id number] Date of Birth: [birth year]  Transition of Care [facility] CM/SW Contact:    Magnus Ivan, LCSW Phone Number: 03/04/2020, 12:52 PM  Clinical Narrative:         CSW informed by MD of plan to discharge patient home today with Home Health and that patient was active with [facility]. CSW called Brittney with [facility] who confirmed they were active for HHPT. Asked MD to place HHPT orders.   Called patient's daughter who confirmed patient was active with [facility]. She reported patient has a walker, bedside commode, and shower chair at home. Daughter will provide transport home at time of discharge.   Expected Discharge Plan: [place]     Patient Goals and CMS Choice Patient states their goals for this hospitalization and ongoing recovery are:: home with home health      Expected Discharge Plan and Services Expected Discharge Plan: [place] Arranged: PT [facility] Agency: [facility] Date [facility] Agency Contacted: 03/04/20   Representative spoke with at [place]: [place]  Prior Living Arrangements/Services                       Activities of Daily Living Home Assistive Devices/Equipment: Environmental consultant (specify type) ADL Screening (condition at time of admission) Patient's cognitive ability adequate to safely complete daily activities?: Yes Is the patient deaf or have difficulty hearing?: No Does the patient have difficulty seeing, even when wearing glasses/contacts?: No Does the patient have difficulty concentrating, remembering, or making decisions?: No Patient able to express need for assistance with ADLs?: Yes Does the patient have difficulty dressing or bathing?: No Independently performs ADLs?: Yes (appropriate for developmental age) Does the patient have difficulty walking or climbing stairs?:  No Weakness of Legs: None Weakness of Arms/Hands: None  Permission Sought/Granted                  Emotional Assessment              Admission diagnosis:  TIA (transient ischemic attack) [G45.9] Blurry vision [H53.8] Generalized weakness [R53.1] Near syncope [R55] Patient Active Problem List   Diagnosis Date Noted  . Abdominal pain 03/03/2020  . HLD (hyperlipidemia) 03/03/2020  . Blurry vision 03/03/2020  . CKD (chronic kidney disease), stage IIIa 03/03/2020  . Left ureteral stone 03/03/2020  . Generalized weakness   . CVA (cerebral vascular accident) ([place]) 02/26/2020  . Chronic cholecystitis 11/06/2019  . Malnutrition of moderate degree 07/30/2019  . Acute neutrophilia 07/30/2019  . Sepsis ([place]) 07/29/2019  . Acute cholecystitis 07/29/2019  . Foot infection   . Acute on chronic heart failure ([place]) 06/07/2019  . Weakness   . NSTEMI (non-ST elevated myocardial infarction) ([place])   . Orthostatic hypotension 01/07/2019  . Macrocytic anemia 08/05/2018  . Anemia of chronic kidney failure, stage 3 (moderate) ([place]) 08/05/2018  . BPH (benign prostatic hyperplasia) 07/24/2018  . Gastroesophageal reflux disease with esophagitis 07/24/2018  . Stage 3 chronic kidney disease ([place]) 07/24/2018  . Thrombocytopenia ([place]) 07/24/2018  . Primary osteoarthritis of both knees 08/22/2017  . Medicare annual wellness visit, initial 11/27/2016  . Chronic systolic heart failure ([place]) 01/17/2016  .  Chewing tobacco use 01/17/2016  . Atrial fibrillation with RVR ([place]) 12/23/2015  . Dyspnea on exertion 12/23/2015  . Elevated troponin 12/23/2015  . GERD (gastroesophageal reflux disease) 12/23/2015  . Iron deficiency anemia due to chronic blood loss 12/20/2015  . Femoral neck fracture, left, closed, initial encounter 11/03/2015  . Chronic atrial fibrillation ([place])   . Coronary artery disease involving native coronary artery of native heart without angina pectoris   . Acalculous  cholecystitis 10/29/2015  . B12 deficiency 08/03/2015  . Erosive esophagitis 08/03/2015  . Adenopathy   . CAD (coronary artery disease) 12/22/2014  . HTN (hypertension) 12/22/2014  . Cardiomyopathy, ischemic 07/27/2014   PCP:  Rusty Aus, MD Pharmacy:   [facility] [place], [place] AT [address] [place] [address] Phone: [phone] Fax: [phone]     Social Determinants of Health (SDOH) Interventions    Readmission Risk Interventions Readmission Risk Prevention Plan 07/30/2019 06/08/2019  Transportation Screening Complete Complete  PCP or Specialist Appt within 3-5 Days Complete Complete  HRI or Home Care Consult Complete Complete  Social Work Consult for [place] Planning/Counseling - Complete  Palliative Care Screening - Not Applicable  Medication Review Press photographer) Complete Complete  Some recent data might be hidden

## 2020-03-04 NOTE — Evaluation (Signed)
Occupational Therapy Evaluation Patient Details Name: Andres Gutierrez MRN: [id number] DOB: 07-01-1930 Today's Date: 03/04/2020    History of Present Illness Andres Gutierrez is an 86 y/o male who was admitted with chief complaints of blurry vision with tingling in extremities with c/o dizziness and headache. MRI negative for acute processes. Pt with recent admission from 10/15-10/16 fro transient loss of taste and gen weakness. PMH inlcudes: recent hemorrhagic storke, HTN, HLD, CAD, thrombocytopenia, MI x 2, s/pstent placement, CKD stage III, anemia, sCHF w/ EF 35-40%, daily tobacco, A-fib not on anticoagulants, and anxiety.   Clinical Impression   Pt demonstrating ability to perform mobility with RW and self care tasks at mod I level this session. Pt reports being mod I at home and living alone. Family is able to assist him as needed. Pt reports symptoms have resolved. Vision test given with pt reporting no concerns. Pt does not need follow up OT services at this time. OT will SIGN OFF. Thank you for this referral.    Follow Up Recommendations  No OT follow up;Supervision - Intermittent    Equipment Recommendations  None recommended by OT    Recommendations for Other Services       Precautions / Restrictions Precautions Precautions: Fall Precaution Comments: low fall risk Required Braces or Orthoses: Other Brace Other Brace: pt has surgical shoe for L foot for foot ulcers per daughter Restrictions Weight Bearing Restrictions: No      Mobility Bed Mobility Overal bed mobility: Needs Assistance Bed Mobility: Supine to Sit     Supine to sit: Supervision     General bed mobility comments: no external physical assistance needed; supervision provided    Transfers Overall transfer level: Modified independent Equipment used: Rolling walker (2 wheeled)       Balance Overall balance assessment: Modified Independent          ADL either performed or assessed with clinical  judgement   ADL Overall ADL's : Modified independent         Vision Baseline Vision/History: No visual deficits Patient Visual Report: No change from baseline Vision Assessment?: No apparent visual deficits            Pertinent Vitals/Pain Pain Assessment: No/denies pain     Hand Dominance Right   Extremity/Trunk Assessment Upper Extremity Assessment Upper Extremity Assessment: Overall WFL for tasks assessed   Lower Extremity Assessment Lower Extremity Assessment: Overall WFL for tasks assessed   Cervical / Trunk Assessment Cervical / Trunk Assessment: Normal   Communication Communication Communication: HOH   Cognition Arousal/Alertness: Awake/alert Behavior During Therapy: WFL for tasks assessed/performed Overall Cognitive Status: Within Functional Limits for tasks assessed        General Comments: A&O x 4              Home Living Family/patient expects to be discharged to:: Private residence Living Arrangements: Alone Available Help at Discharge: Family;Available PRN/intermittently Type of Home: House Home Access: Stairs to enter [employer] of Steps: 2 Entrance Stairs-Rails: Right Home Layout: One level     Bathroom Shower/Tub: Occupational psychologist: Standard     Home Equipment: Environmental consultant - 2 wheels;Cane - single point;Bedside commode;Shower seat;Grab bars - tub/shower          Prior Functioning/Environment Level of Independence: Independent with assistive device(s)  Gait / Transfers Assistance Needed: pt ambulates using RW at baseline ADL's / Homemaking Assistance Needed: I with (B)ADLs.  Children assist with all community tasks. Pt reported he  does not drive, family assists with cooking/cleaning as needed   Comments: Pt denies fall history in the past 6 months.         AM-PAC OT "6 Clicks" Daily Activity     Outcome Measure Help from another person eating meals?: None Help from another person taking care of  personal grooming?: None Help from another person toileting, which includes using toliet, bedpan, or urinal?: None Help from another person bathing (including washing, rinsing, drying)?: None Help from another person to put on and taking off regular upper body clothing?: None Help from another person to put on and taking off regular lower body clothing?: None 6 Click Score: 24   End of Session Equipment Utilized During Treatment: Rolling walker Nurse Communication: Mobility status  Activity Tolerance: Patient tolerated treatment well Patient left: in chair;with chair alarm set;with family/visitor present                   Time: 1655-3748 OT Time Calculation (min): 16 min Charges:  OT General Charges $OT Visit: 1 Visit OT Evaluation $OT Eval Low Complexity: 1 Low  Darleen Crocker, MS, OTR/L , CBIS ascom [phone]  03/04/20, 1:18 PM

## 2020-03-04 NOTE — Telephone Encounter (Signed)
LMOM FOR PT TO CALL OFFICE TO SCHEDULE NP APPT FOR Monday AFTERNOON.

## 2020-03-05 ENCOUNTER — Encounter: Payer: Self-pay | Admitting: Internal Medicine

## 2020-03-05 DIAGNOSIS — E854 Organ-limited amyloidosis: Secondary | ICD-10-CM | POA: Diagnosis present

## 2020-03-05 NOTE — Discharge Summary (Addendum)
[facility] Discharge Summary   Patient: Andres Gutierrez MHD:[id number]  PCP: Rusty Aus, MD  Date of admission: 03/03/2020   Date of discharge: 03/04/2020     Discharge Diagnoses:  Principal diagnosis Hypotension Active Problems:   CAD (coronary artery disease)   HTN (hypertension)   Chronic atrial fibrillation (HCC)   Elevated troponin   GERD (gastroesophageal reflux disease)   Chronic systolic heart failure (HCC)   Macrocytic anemia   Thrombocytopenia (HCC)   CVA (cerebral vascular accident) ([place])   Abdominal pain   HLD (hyperlipidemia)   CKD (chronic kidney disease), stage IIIa   Left ureteral stone  Admitted From: home Disposition:  Home   Recommendations for Outpatient Follow-up:  1. PCP: Follow-up with PCP in 1 week 2. Follow up LABS/TEST: Discussed about blood pressure medications.   Follow-up Information    Rusty Aus, MD. Go on 03/14/2020.   Specialty: Internal Medicine Why: at 2:15 pm Contact information: [address] [place] [address] [phone]              Diet recommendation: Cardiac diet  Activity: The patient is advised to gradually reintroduce usual activities, as tolerated  Discharge Condition: stable  Code Status: Full code   History of present illness: As per the H and P dictated on admission, "Andres Gutierrez is a 86 y.o. male with medical history significant of recent hemorrhagic stroke, hypertension, hyperlipidemia, GERD, CAD, thrombocytopenia, myocardial infarction, s/p of stent placement, CKD stage III, anemia,sCHF with EF 35-40%, daily tobacco, atrial fibrillation not on anticoagulants, anxiety, who presents with blurry vision and tingling in extremities.  Pt was recently hospitalized from 10/15-10/16 due to subacute small focus of left occipital intraparenchymal hemorrhage. Patient states that he went to bed last night feeling fine and woke up about 6:30-7:00 am with blurred  vision in both eyes and tingling in all extremities.  No weakness in extremities.  No facial droop, slurred speech or difficulty swallowing.  He also complains of dizziness and headache.  Per his daughter, patient lost peripheral vision on both sides transiently.  Patient also reports left lower quadrant abdominal pain, which is mild, sharp, nonradiating.  Denies nausea, vomiting, diarrhea.  No symptoms of UTI or hematuria.  Patient does not have chest pain, shortness breath, cough, fever or chills."  Hospital Course:  Summary of his active problems in the hospital is as following. Blurry vision:  Recurrent admission with same complaint. Patient has bilateral blurry vision, bilateral weakness and bilateral numbness of all extremities. This is a temporary thing and resolves on admission. MRI brain negative x2. Patient does not have any focal deficit the time of my evaluation. No symptoms at present. Suspect that his generalized symptoms are coming primarily from blood pressure fluctuations. Patient has lost 33 pounds over last 1 year and suspect that his chronic long-term blood pressure medication may be too much for him. Currently holding Lasix and Lopressor. Outpatient follow-up with PCP. B12 normal. TSH normal. Digoxin level normal. PT OT were consulted and felt that patient does not have any significant deficit.  HTN (hypertension) Blood pressure is also normal at present. Patient actually has lost 33 pounds over last 1 year that suggested the patient's chronic blood pressure medication may be too much for him. At present I will be holding off on all the blood pressure medication as the patient presents with generalized weakness and I suspect that this is associated with orthostasis. Outpatient follow-up with PCP.  CAD and elevated trop:  No chest pain. Trop 21.   Likely due to demand ischemia.  A1c 5.1 and LDL 38 on 02/27/2020. -ASA and Lipitor -Ranexa  Chronic atrial  fibrillation [facility]):  Patient is not taking anticoagulants Rate is very well controlled. At present I will be holding metoprolol  GERD (gastroesophageal reflux disease) Pepcid   Chronic systolic heart failure ([place]):  2D echo on 02/27/2020 showed EF 35--40%.   Patient does not have leg edema.   No respiratory distress.   seem to be compensated. Change Lasix to as needed.  Macrocytic anemia:  Hemoglobin stable Continue home iron supplement  Thrombocytopenia ([place]):  This is chronic issue.  History of CVA (cerebral vascular accident) [facility]): On Lipitor and aspirin While presentation is with blurry vision but on examination patient does not have any focal deficit. Patient actually has admission with similar presentation twice. Repeat MRI brain is actually negative for any acute stroke as well. Continue home regimen. Suspect his recurrent presentation with blurry vision is likely secondary to blood pressure dropping.   Abdominal pain and left distal ureteral stone:  CT showed a 2-3 mm stone within the distal left ureter which is unchanged from prior study without associated hydroureteronephrosis or periureteral stranding.  Dr. Diamantina Providence reviewed chart and image. Per. Dr. Diamantina Providence, it looks like the stone has been there since at least 03/2019, with stable kidney function and no hydronephrosis.   No intervention needed at this moment.   Dr. Diamantina Providence will add patient to his clinic schedule for next week.  HLD (hyperlipidemia) Lipitor  CKD (chronic kidney disease), stage IIIa:  Close to baseline.   Recent baseline creatinine 1.5-1.8.   His creatinine is 1.85, BUN 29.  Home health was arranged. On the day of the discharge the patient's vitals were stable, and no other acute medical condition were reported by patient. The patient was felt safe to be discharge at Home with Home health.  Consultants: none Procedures: none  Discharge Exam: General: Appear in no distress, no Rash;  Oral Mucosa Clear, moist. no Abnormal Neck Mass Or lumps, Conjunctiva normal  Cardiovascular: S1 and S2 Present, no Murmur Respiratory: good respiratory effort, Bilateral Air entry present and CTA, no Crackles, no wheezes Abdomen: Bowel Sound present, Soft and no tenderness Extremities: no Pedal edema Neurology: alert and oriented to time, place, and person affect appropriate. no new focal deficit  Filed Weights   03/03/20 0848  Weight: 82.3 kg   Vitals:   03/04/20 0740 03/04/20 1159  BP: 108/64 (!) 106/53  Pulse: 64 68  Resp: 17 17  Temp: 98.2 F (36.8 C) 98 F (36.7 C)  SpO2: 97% 99%    DISCHARGE MEDICATION: Allergies as of 03/04/2020      Reactions   Levaquin [levofloxacin]    Andres Gutierrez, her daughter, said Levaquin should be avoided because when patient had Levaquin a few years ago, he had "a bad reaction".  She said he could not walk and could not lift up his feet.   Escitalopram Other (See Comments)   Reaction:  Makes him feel faint, like he was going to have a heart attack.   5ht3 Receptor Antagonists    Diltiazem Rash   Diphenhydramine Hcl Other (See Comments)   Reaction:  Rash and fever a long time ago, but has had it since with no problem.   Maxidex [dexamethasone] Other (See Comments)   Reaction:  Unknown    Prednisone Other (See Comments)   Pt states that this medication  made him feel crazy.     Serotonin Other (See Comments)   Tried 2 different types, lexapro and another one.  Reaction:  Made him feel like he was having a heart attack.    Vytorin [ezetimibe-simvastatin] Other (See Comments)   Reaction:  Unknown    Zocor [simvastatin] Other (See Comments)   Reaction:  Unknown       Medication List    STOP taking these medications   metoprolol tartrate 25 MG tablet Commonly known as: LOPRESSOR     TAKE these medications   aspirin 81 MG EC tablet Take 1 tablet (81 mg total) by mouth daily. RESUME 48HRS after surgery   atorvastatin 40 MG tablet Commonly  known as: LIPITOR Take 1 tablet (40 mg total) by mouth daily with supper.   digoxin 0.125 MG tablet Commonly known as: LANOXIN Take 0.0625 mg by mouth daily.   famotidine 20 MG tablet Commonly known as: PEPCID Take 20 mg by mouth at bedtime.   feeding supplement Liqd Take 237 mLs by mouth 2 (two) times daily between meals.   ferrous sulfate 325 (65 FE) MG tablet Take 1 tablet (325 mg total) by mouth every Monday, Wednesday, and Friday. With supper   furosemide 20 MG tablet Commonly known as: LASIX Take 1 tablet (20 mg total) by mouth daily as needed (for weight gain of 3lbs in 1 day or 5 lbs in 2 days or shortness of breath or leg swelling). What changed:   medication strength  when to take this  reasons to take this   multivitamin tablet Take 1 tablet by mouth daily. Take 1 tablet by mouth daily   neomycin-bacitracin-polymyxin ointment Commonly known as: NEOSPORIN Apply 1 application topically every other day. Wound care (foot area)   PRESCRIPTION MEDICATION   ranolazine 1000 MG SR tablet Commonly known as: RANEXA Take 500 mg by mouth 2 (two) times daily.   sucralfate 1 g tablet Commonly known as: CARAFATE Take 1 g by mouth 2 (two) times daily.   tamsulosin 0.4 MG Caps capsule Commonly known as: FLOMAX Take 0.4 mg by mouth daily after supper.   vitamin B-12 500 MCG tablet Commonly known as: CYANOCOBALAMIN Take 500 mcg by mouth daily.   VITAMIN C PO Take 1 tablet by mouth daily.      Allergies  Allergen Reactions  . Levaquin [Levofloxacin]     Andres Gutierrez, her daughter, said Levaquin should be avoided because when patient had Levaquin a few years ago, he had "a bad reaction".  She said he could not walk and could not lift up his feet.  . Escitalopram Other (See Comments)    Reaction:  Makes him feel faint, like he was going to have a heart attack.  . 5ht3 Receptor Antagonists   . Diltiazem Rash  . Diphenhydramine Hcl Other (See Comments)    Reaction:  Rash  and fever a long time ago, but has had it since with no problem.  . Maxidex [Dexamethasone] Other (See Comments)    Reaction:  Unknown   . Prednisone Other (See Comments)    Pt states that this medication made him feel crazy.    . Serotonin Other (See Comments)    Tried 2 different types, lexapro and another one.  Reaction:  Made him feel like he was having a heart attack.   . Vytorin [Ezetimibe-Simvastatin] Other (See Comments)    Reaction:  Unknown   . Zocor [Simvastatin] Other (See Comments)    Reaction:  Unknown  Discharge Instructions    Diet - low sodium heart healthy   Complete by: As directed    Increase activity slowly   Complete by: As directed    No wound care   Complete by: As directed       The results of significant diagnostics from this hospitalization (including imaging, microbiology, ancillary and laboratory) are listed below for reference.    Significant Diagnostic Studies: DG Chest 2 View  Result Date: 03/03/2020 CLINICAL DATA:  Shortness of breath. EXAM: CHEST - 2 VIEW COMPARISON:  02/26/2020 FINDINGS: Sequelae of CABG are again identified. The cardiomediastinal silhouette is unchanged with upper limits of normal heart size. The lungs are hyperinflated. Interstitial type densities in the left greater than right lung bases have increased. No pleural effusion or pneumothorax is identified. A thin walled cavitary lesion is again noted in the left lung apex, more fully evaluated on 01/06/2020 CT. No acute osseous abnormality is seen. IMPRESSION: Increased bibasilar opacities which may reflect increased atelectasis or infection. Electronically Signed   By: Logan Bores M.D.   On: 03/03/2020 09:38   DG Chest 2 View  Result Date: 02/26/2020 CLINICAL DATA:  Weakness. EXAM: CHEST - 2 VIEW COMPARISON:  Generalized weakness.  Loss of taste. FINDINGS: Heart size upper limits of normal. Changes of COPD are present. No focal airspace disease is evident. Degenerative changes  are present in the thoracic spine. Patient is status post CABG. IMPRESSION: 1. No acute cardiopulmonary disease. 2. Changes of COPD. Electronically Signed   By: San Morelle M.D.   On: 02/26/2020 06:14   CT Head Wo Contrast  Result Date: 03/03/2020 CLINICAL DATA:  Intracranial hemorrhage, follow-up EXAM: CT HEAD WITHOUT CONTRAST TECHNIQUE: Contiguous axial images were obtained from the base of the skull through the vertex without intravenous contrast. COMPARISON:  02/27/2020 FINDINGS: Brain: There is no definite hyperdense hemorrhage present. There is no mass effect or edema. No new loss of gray-white differentiation. Stable findings of chronic microvascular ischemic changes. Ventricles are stable in size. Vascular: There is atherosclerotic calcification at the skull base. Skull: Calvarium is unremarkable. Sinuses/Orbits: No acute finding. Other: None. IMPRESSION: No definite left occipital hemorrhage. No new hemorrhage or other acute abnormality. Electronically Signed   By: Macy Mis M.D.   On: 03/03/2020 10:17   CT HEAD WO CONTRAST  Result Date: 02/27/2020 CLINICAL DATA:  Parenchymal hemorrhage, follow-up. EXAM: CT HEAD WITHOUT CONTRAST TECHNIQUE: Contiguous axial images were obtained from the base of the skull through the vertex without intravenous contrast. COMPARISON:  02/26/2020 head CT and MRI head. 01/07/2019 head CT and prior. FINDINGS: Brain: Small focus of left occipital hemorrhage seen on prior MRI correlates with minimal hyperdensities on CT (2: 12). No new or enlarging intracranial hemorrhage. No acute infarct. No mass lesion. No midline shift, ventriculomegaly or extra-axial fluid collection. Mild cerebral atrophy with ex vacuo dilatation. Chronic microvascular ischemic changes. Vascular: No hyperdense vessel or unexpected calcification. Skull base atherosclerotic calcifications. Skull: Negative for fracture or focal lesion. Sinuses/Orbits: Normal orbits. Clear paranasal  sinuses. No mastoid effusion. Other: None. IMPRESSION: Minimal left occipital hemorrhage, unchanged. No new or enlarging intracranial hemorrhage. Age-related cerebral atrophy and chronic microvascular ischemic changes. Electronically Signed   By: Primitivo Gauze M.D.   On: 02/27/2020 10:35   CT Head Wo Contrast  Result Date: 02/26/2020 CLINICAL DATA:  Diffuse weakness and tingling which began last night. EXAM: CT HEAD WITHOUT CONTRAST TECHNIQUE: Contiguous axial images were obtained from the base of the skull through the vertex  without intravenous contrast. COMPARISON:  Head CT scan 01/07/2019. FINDINGS: Brain: No evidence of acute infarction, hemorrhage, hydrocephalus, extra-axial collection or mass lesion/mass effect. Atrophy, chronic microvascular ischemic change and remote left cerebellar lacunar infarct noted. Vascular: No hyperdense vessel or unexpected calcification. Skull: Intact.  No focal lesion. Sinuses/Orbits: Negative. Other: None. IMPRESSION: No acute abnormality. Atrophy and chronic microvascular ischemic change. Electronically Signed   By: Inge Rise M.D.   On: 02/26/2020 09:42   MR ANGIO HEAD WO CONTRAST  Result Date: 02/26/2020 CLINICAL DATA:  Neuro deficit.  Weakness and tingling.  Headache. EXAM: MRI HEAD WITHOUT CONTRAST MRA HEAD WITHOUT CONTRAST TECHNIQUE: Multiplanar, multiecho pulse sequences of the brain and surrounding structures were obtained without intravenous contrast. Angiographic images of the head were obtained using MRA technique without contrast. COMPARISON:  02/26/2020 head CT and prior. FINDINGS: Image quality is mildly degraded by motion artifact. MRI HEAD FINDINGS Brain: No diffusion-weighted signal abnormality. Left occipital SWI signal dropout with correlative intrinsically T1/FLAIR hyperintense signal (16:57). No midline shift, ventriculomegaly or extra-axial fluid collection. No mass lesion. Scattered bilateral cerebral and cerebellar remote  microhemorrhages. Mild cerebral atrophy with ex vacuo dilatation. Moderate chronic microvascular ischemic changes. Vascular: Please see MRA head. Skull and upper cervical spine: Normal marrow signal. Sinuses/Orbits: Sequela of bilateral lens replacement. Minimal maxillary sinus mucosal thickening. Trace right mastoid free fluid. Other: None. MRA HEAD FINDINGS Anterior circulation: Patent normal caliber appearance of the bilateral ICAs, anterior and middle cerebral arteries. The right posterior communicating artery may be absent. Left PCOM hypoplasia. No significant stenosis, proximal occlusion, aneurysm, or vascular malformation. Posterior circulation: Dominant left vertebral artery. Proximally patent right PICA. Patent normal caliber basilar, superior cerebellar and posterior cerebral arteries. Patent normal caliber bilateral AICA. No significant stenosis, proximal occlusion, aneurysm, or vascular malformation. Venous sinuses: No evidence of thrombosis. Anatomic variants: Discussed above. IMPRESSION: MRI head: Small focus of left occipital intraparenchymal hemorrhage, likely subacute. Sequela of cerebral amyloid angiopathy. Mild cerebral atrophy. Moderate chronic microvascular ischemic changes. MRA head: No large vessel occlusion, high-grade narrowing or aneurysm. These results were called by telephone at the time of interpretation on 02/26/2020 at 11:45 am to provider Merlyn Lot , who verbally acknowledged these results. Electronically Signed   By: Primitivo Gauze M.D.   On: 02/26/2020 11:55   MR BRAIN WO CONTRAST  Result Date: 03/03/2020 CLINICAL DATA:  Transit ischemic attack. EXAM: MRI HEAD WITHOUT CONTRAST TECHNIQUE: Multiplanar, multiecho pulse sequences of the brain and surrounding structures were obtained without intravenous contrast. COMPARISON:  03/03/2020 head CT.  02/26/2020 MRI/MRA head. FINDINGS: Brain: No diffusion-weighted signal abnormality. No new intracranial hemorrhage. No midline  shift, ventriculomegaly or extra-axial fluid collection. No mass lesion. Mild cerebral atrophy. Moderate chronic microvascular ischemic changes. Scattered remote bilateral cerebral and cerebellar microhemorrhages. Sequela of prior left occipital insult. Vascular: Normal flow voids. Skull and upper cervical spine: Normal marrow signal. Sinuses/Orbits: Sequela of bilateral lens replacement. Clear paranasal sinuses. Trace right mastoid effusion. Other: None. IMPRESSION: No acute intracranial process. Sequela of cerebral amyloid angiopathy and prior left occipital hemorrhage. Mild cerebral atrophy and moderate chronic microvascular ischemic changes. Electronically Signed   By: Primitivo Gauze M.D.   On: 03/03/2020 14:17   MR BRAIN WO CONTRAST  Result Date: 02/26/2020 CLINICAL DATA:  Neuro deficit.  Weakness and tingling.  Headache. EXAM: MRI HEAD WITHOUT CONTRAST MRA HEAD WITHOUT CONTRAST TECHNIQUE: Multiplanar, multiecho pulse sequences of the brain and surrounding structures were obtained without intravenous contrast. Angiographic images of the head were obtained using MRA  technique without contrast. COMPARISON:  02/26/2020 head CT and prior. FINDINGS: Image quality is mildly degraded by motion artifact. MRI HEAD FINDINGS Brain: No diffusion-weighted signal abnormality. Left occipital SWI signal dropout with correlative intrinsically T1/FLAIR hyperintense signal (16:57). No midline shift, ventriculomegaly or extra-axial fluid collection. No mass lesion. Scattered bilateral cerebral and cerebellar remote microhemorrhages. Mild cerebral atrophy with ex vacuo dilatation. Moderate chronic microvascular ischemic changes. Vascular: Please see MRA head. Skull and upper cervical spine: Normal marrow signal. Sinuses/Orbits: Sequela of bilateral lens replacement. Minimal maxillary sinus mucosal thickening. Trace right mastoid free fluid. Other: None. MRA HEAD FINDINGS Anterior circulation: Patent normal caliber  appearance of the bilateral ICAs, anterior and middle cerebral arteries. The right posterior communicating artery may be absent. Left PCOM hypoplasia. No significant stenosis, proximal occlusion, aneurysm, or vascular malformation. Posterior circulation: Dominant left vertebral artery. Proximally patent right PICA. Patent normal caliber basilar, superior cerebellar and posterior cerebral arteries. Patent normal caliber bilateral AICA. No significant stenosis, proximal occlusion, aneurysm, or vascular malformation. Venous sinuses: No evidence of thrombosis. Anatomic variants: Discussed above. IMPRESSION: MRI head: Small focus of left occipital intraparenchymal hemorrhage, likely subacute. Sequela of cerebral amyloid angiopathy. Mild cerebral atrophy. Moderate chronic microvascular ischemic changes. MRA head: No large vessel occlusion, high-grade narrowing or aneurysm. These results were called by telephone at the time of interpretation on 02/26/2020 at 11:45 am to provider Merlyn Lot , who verbally acknowledged these results. Electronically Signed   By: Primitivo Gauze M.D.   On: 02/26/2020 11:55   CT Abdomen Pelvis W Contrast  Result Date: 03/03/2020 CLINICAL DATA:  Left lower quadrant abdominal pain EXAM: CT ABDOMEN AND PELVIS WITH CONTRAST TECHNIQUE: Multidetector CT imaging of the abdomen and pelvis was performed using the standard protocol following bolus administration of intravenous contrast. CONTRAST:  62mL OMNIPAQUE IOHEXOL 300 MG/ML  SOLN COMPARISON:  07/29/2019 FINDINGS: Lower chest: Mild bibasilar atelectasis. Chronic right pleural thickening with associated calcified pleural plaque again noted. Heart size is upper limits of normal. Hepatobiliary: No focal liver lesion is identified. Interval cholecystectomy. No biliary dilatation. Pancreas: Unremarkable. No pancreatic ductal dilatation or surrounding inflammatory changes. Spleen: Normal in size without focal abnormality. Adrenals/Urinary  Tract: Unremarkable adrenal glands. Stable appearance of bilateral renal cysts. No hydronephrosis. Previously seen right sided renal stone is no longer identified. No right ureteral calculi. 2-3 mm stone within the distal left ureter (series 2, image 70) is unchanged from prior without associated hydroureteronephrosis or periureteral stranding. Urinary bladder within normal limits. Stomach/Bowel: Small hiatal hernia. Stomach otherwise unremarkable. No dilated loops of small bowel. Postsurgical changes to the cecum with prior appendectomy. Moderate volume of stool throughout the colon. No focal bowel wall thickening or inflammatory changes. Vascular/Lymphatic: Aortic atherosclerosis. No enlarged abdominal or pelvic lymph nodes. Reproductive: Prostate is unremarkable. Other: Trace amount of free fluid in the pelvis, nonspecific. No abdominopelvic fluid collection. No pneumoperitoneum. No abdominal wall hernia. Musculoskeletal: Degenerative changes of the spine and bilateral hips, not appreciably changed from prior. No new or acute osseous findings. IMPRESSION: 1. No new or acute abdominopelvic findings. Interval cholecystectomy. 2. Trace amount of free fluid in the pelvis, nonspecific. 3. 2-3 mm stone within the distal left ureter is unchanged from prior study without associated hydroureteronephrosis or periureteral stranding. 4. Moderate volume of stool throughout the colon. 5. Aortic atherosclerosis.  (ICD10-I70.0). Electronically Signed   By: Davina Poke D.O.   On: 03/03/2020 10:25   ECHOCARDIOGRAM COMPLETE  Result Date: 02/27/2020    ECHOCARDIOGRAM REPORT   Patient Name:  Andres Gutierrez Date of Exam: 02/27/2020 Medical Rec #:  [id number]    Height:       76.0 in Accession #:    [id number]   Weight:       181.4 lb Date of Birth:  Dec 15, 1930    BSA:          2.125 m Patient Age:    17 years     BP:           119/65 mmHg Patient Gender: M            HR:           69 bpm. Exam Location:  [facility] Procedure: 2D  Echo Indications:     Stroke 434.91 / I163.9  History:         Patient has prior history of Echocardiogram examinations, most                  recent 07/30/2019. Previous Myocardial Infarction,                  Arrythmias:Atrial Fibrillation, Signs/Symptoms:Dyspnea; Risk                  Factors:Hypertension.  Sonographer:     Avanell Shackleton Referring Phys:  [id number] Diagnosing Phys: Bartholome Bill MD IMPRESSIONS  1. Left ventricular ejection fraction, by estimation, is 35 to 40%. Left ventricular ejection fraction by PLAX is 40 %. The left ventricle has mild to moderately decreased function. The left ventricle demonstrates regional wall motion abnormalities (see  scoring diagram/findings for description). The left ventricular internal cavity size was mildly dilated. Left ventricular diastolic parameters are consistent with Grade I diastolic dysfunction (impaired relaxation).  2. Right ventricular systolic function is normal. The right ventricular size is normal.  3. Left atrial size was mildly dilated.  4. Right atrial size was mildly dilated.  5. The mitral valve is grossly normal. Mild mitral valve regurgitation.  6. The aortic valve was not well visualized. Aortic valve regurgitation is mild. Mild aortic valve stenosis. FINDINGS  Left Ventricle: Left ventricular ejection fraction, by estimation, is 35 to 40%. Left ventricular ejection fraction by PLAX is 40 %. The left ventricle has mild to moderately decreased function. The left ventricle demonstrates regional wall motion abnormalities. The left ventricular internal cavity size was mildly dilated. There is borderline left ventricular hypertrophy. Left ventricular diastolic parameters are consistent with Grade I diastolic dysfunction (impaired relaxation).  LV Wall Scoring: The mid and distal anterior septum is akinetic. The antero-lateral wall, basal anteroseptal segment, apical lateral segment, mid inferoseptal segment, basal inferoseptal segment,  and apex are hypokinetic. The posterior wall is normal. Right Ventricle: The right ventricular size is normal. No increase in right ventricular wall thickness. Right ventricular systolic function is normal. Left Atrium: Left atrial size was mildly dilated. Right Atrium: Right atrial size was mildly dilated. Pericardium: There is no evidence of pericardial effusion. Mitral Valve: The mitral valve is grossly normal. Mild mitral valve regurgitation. Tricuspid Valve: The tricuspid valve is not well visualized. Tricuspid valve regurgitation is trivial. Aortic Valve: The aortic valve was not well visualized. Aortic valve regurgitation is mild. Mild aortic stenosis is present. Aortic valve mean gradient measures 12.0 mmHg. Aortic valve peak gradient measures 20.2 mmHg. Aortic valve area, by VTI measures 2.38 cm. Pulmonic Valve: The pulmonic valve was not well visualized. Pulmonic valve regurgitation is trivial. Aorta: The aortic root was not well visualized. IAS/Shunts: The interatrial septum was not assessed.  LEFT VENTRICLE PLAX 2D LV EF:         Left            Diastology                ventricular     LV e' lateral:   6.74 cm/s                ejection        LV E/e' lateral: 14.5                fraction by                PLAX is 40                %. LVIDd:         5.62 cm LVIDs:         4.51 cm LV PW:         1.35 cm LV IVS:        0.85 cm LVOT diam:     2.40 cm LV SV:         111 LV SV Index:   52 LVOT Area:     4.52 cm  RIGHT VENTRICLE         IVC TAPSE (M-mode): 1.8 cm  IVC diam: 1.64 cm LEFT ATRIUM              Index       RIGHT ATRIUM           Index LA diam:        4.80 cm  2.26 cm/m  RA Area:     20.60 cm LA Vol (A2C):   111.0 ml 52.23 ml/m RA Volume:   57.70 ml  27.15 ml/m LA Vol (A4C):   97.6 ml  45.92 ml/m LA Biplane Vol: 104.0 ml 48.94 ml/m  AORTIC VALVE AV Area (Vmax):    2.49 cm AV Area (Vmean):   2.24 cm AV Area (VTI):     2.38 cm AV Vmax:           225.00 cm/s AV Vmean:          166.000 cm/s  AV VTI:            0.466 m AV Peak Grad:      20.2 mmHg AV Mean Grad:      12.0 mmHg LVOT Vmax:         124.00 cm/s LVOT Vmean:        82.100 cm/s LVOT VTI:          0.245 m LVOT/AV VTI ratio: 0.53  AORTA Ao Root diam: 3.70 cm MITRAL VALVE               TRICUSPID VALVE MV Area (PHT): 6.54 cm    TR Peak grad:   22.3 mmHg MV Decel Time: 116 msec    TR Vmax:        236.00 cm/s MV E velocity: 97.40 cm/s MV A velocity: 94.60 cm/s  SHUNTS MV E/A ratio:  1.03        Systemic VTI:  0.24 m                            Systemic Diam: 2.40 cm Bartholome Bill MD Electronically signed by Bartholome Bill MD Signature Date/Time: 02/27/2020/10:54:27 AM    Final     Microbiology: Recent Results (from the past  240 hour(s))  Respiratory Panel by RT PCR (Flu A&B, Covid) - Nasopharyngeal Swab     Status: None   Collection Time: 02/26/20  5:42 AM   Specimen: Nasopharyngeal Swab  Result Value Ref Range Status   SARS Coronavirus 2 by RT PCR NEGATIVE NEGATIVE Final    Comment: (NOTE) SARS-CoV-2 target nucleic acids are NOT DETECTED.  The SARS-CoV-2 RNA is generally detectable in upper respiratoy specimens during the acute phase of infection. The lowest concentration of SARS-CoV-2 viral copies this assay can detect is 131 copies/mL. A negative result does not preclude SARS-Cov-2 infection and should not be used as the sole basis for treatment or other patient management decisions. A negative result may occur with  improper specimen collection/handling, submission of specimen other than nasopharyngeal swab, presence of viral mutation(s) within the areas targeted by this assay, and inadequate number of viral copies (<131 copies/mL). A negative result must be combined with clinical observations, patient history, and epidemiological information. The expected result is Negative.  Fact Sheet for Patients:  PinkCheek.be  Fact Sheet for Healthcare Providers:    GravelBags.it  This test is no t yet approved or cleared by the [place] FDA and  has been authorized for detection and/or diagnosis of SARS-CoV-2 by FDA under an Emergency Use Authorization (EUA). This EUA will remain  in effect (meaning this test can be used) for the duration of the COVID-19 declaration under Section 564(b)(1) of the Act, 21 U.S.C. section 360bbb-3(b)(1), unless the authorization is terminated or revoked sooner.     Influenza A by PCR NEGATIVE NEGATIVE Final   Influenza B by PCR NEGATIVE NEGATIVE Final    Comment: (NOTE) The Xpert Xpress SARS-CoV-2/FLU/RSV assay is intended as an aid in  the diagnosis of influenza from Nasopharyngeal swab specimens and  should not be used as a sole basis for treatment. Nasal washings and  aspirates are unacceptable for Xpert Xpress SARS-CoV-2/FLU/RSV  testing.  Fact Sheet for Patients: PinkCheek.be  Fact Sheet for Healthcare Providers: GravelBags.it  This test is not yet approved or cleared by the [place] FDA and  has been authorized for detection and/or diagnosis of SARS-CoV-2 by  FDA under an Emergency Use Authorization (EUA). This EUA will remain  in effect (meaning this test can be used) for the duration of the  Covid-19 declaration under Section 564(b)(1) of the Act, 21  U.S.C. section 360bbb-3(b)(1), unless the authorization is  terminated or revoked. Performed at [facility], [address] [place], [place] [address]   Respiratory Panel by RT PCR (Flu A&B, Covid) - Nasopharyngeal Swab     Status: None   Collection Time: 03/03/20 11:22 AM   Specimen: Nasopharyngeal Swab  Result Value Ref Range Status   SARS Coronavirus 2 by RT PCR NEGATIVE NEGATIVE Final    Comment: (NOTE) SARS-CoV-2 target nucleic acids are NOT DETECTED.  The SARS-CoV-2 RNA is generally detectable in upper respiratoy specimens during the  acute phase of infection. The lowest concentration of SARS-CoV-2 viral copies this assay can detect is 131 copies/mL. A negative result does not preclude SARS-Cov-2 infection and should not be used as the sole basis for treatment or other patient management decisions. A negative result may occur with  improper specimen collection/handling, submission of specimen other than nasopharyngeal swab, presence of viral mutation(s) within the areas targeted by this assay, and inadequate number of viral copies (<131 copies/mL). A negative result must be combined with clinical observations, patient history, and epidemiological information. The expected result is  Negative.  Fact Sheet for Patients:  PinkCheek.be  Fact Sheet for Healthcare Providers:  GravelBags.it  This test is no t yet approved or cleared by the [place] FDA and  has been authorized for detection and/or diagnosis of SARS-CoV-2 by FDA under an Emergency Use Authorization (EUA). This EUA will remain  in effect (meaning this test can be used) for the duration of the COVID-19 declaration under Section 564(b)(1) of the Act, 21 U.S.C. section 360bbb-3(b)(1), unless the authorization is terminated or revoked sooner.     Influenza A by PCR NEGATIVE NEGATIVE Final   Influenza B by PCR NEGATIVE NEGATIVE Final    Comment: (NOTE) The Xpert Xpress SARS-CoV-2/FLU/RSV assay is intended as an aid in  the diagnosis of influenza from Nasopharyngeal swab specimens and  should not be used as a sole basis for treatment. Nasal washings and  aspirates are unacceptable for Xpert Xpress SARS-CoV-2/FLU/RSV  testing.  Fact Sheet for Patients: PinkCheek.be  Fact Sheet for Healthcare Providers: GravelBags.it  This test is not yet approved or cleared by the [place] FDA and  has been authorized for detection and/or diagnosis  of SARS-CoV-2 by  FDA under an Emergency Use Authorization (EUA). This EUA will remain  in effect (meaning this test can be used) for the duration of the  Covid-19 declaration under Section 564(b)(1) of the Act, 21  U.S.C. section 360bbb-3(b)(1), unless the authorization is  terminated or revoked. Performed at [facility], [address] [place], [place] [address]      Labs: CBC: Recent Labs  Lab 03/03/20 0858  WBC 9.3  NEUTROABS 4.0  HGB 9.4*  HCT 28.3*  MCV 105.6*  PLT 491*   Basic Metabolic Panel: Recent Labs  Lab 03/03/20 0858  NA 138  K 4.4  CL 105  CO2 22  GLUCOSE 100*  BUN 29*  CREATININE 1.85*  CALCIUM 9.2   Liver Function Tests: Recent Labs  Lab 03/03/20 0858  AST 20  ALT 16  ALKPHOS 57  BILITOT 1.1  PROT 7.7  ALBUMIN 4.4   CBG: No results for input(s): GLUCAP in the last 168 hours.  Time spent: 35 minutes  Signed:  Berle Mull  [facility] 03/04/2020 5:30 PM

## 2020-03-06 IMAGING — CT CT HEAD WITHOUT CONTRAST
3 series · 15 of 47 positions shown, 18 images · non-contrast
Comparison: 11/03/2015

CLINICAL DATA: Ataxia. Dizziness beginning this morning.

EXAM:
CT HEAD WITHOUT CONTRAST
TECHNIQUE: Contiguous axial images were obtained from the base of the skull
through the vertex without intravenous contrast.

[Series 2: head wo · axial · 0.41mm/px · z∈[+389,+514]mm · 9 of 31 slices shown, 12 images]
[im 3/31  brain]
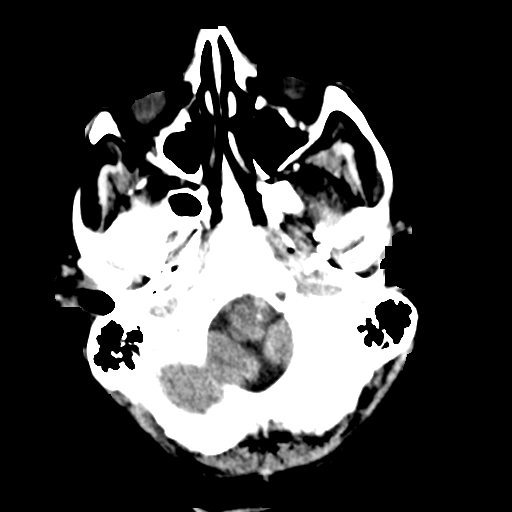
[im 3/31  bone]
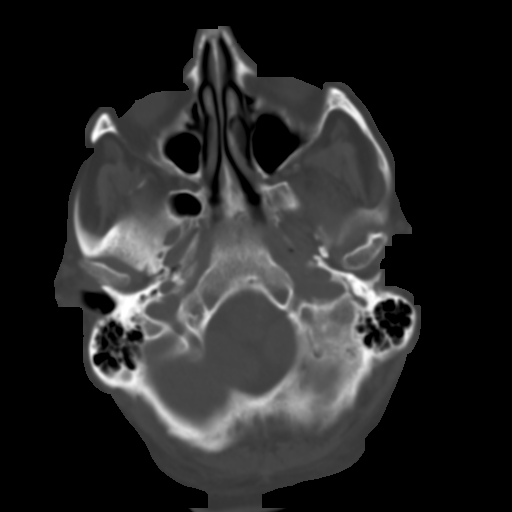
[im 6/31  brain]
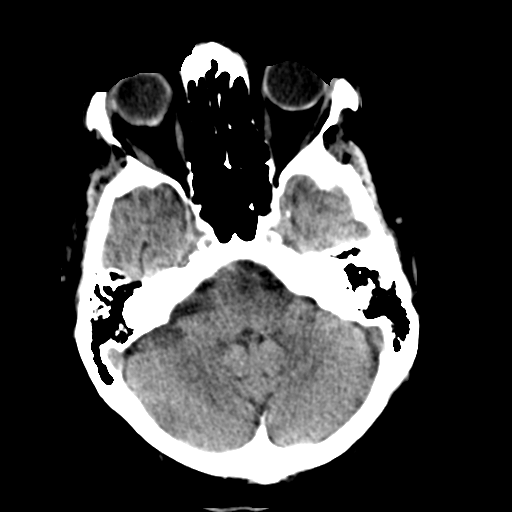
[im 9/31  brain]
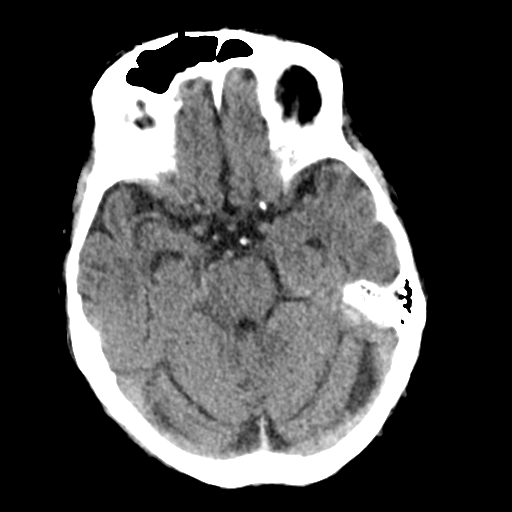
[im 12/31  brain]
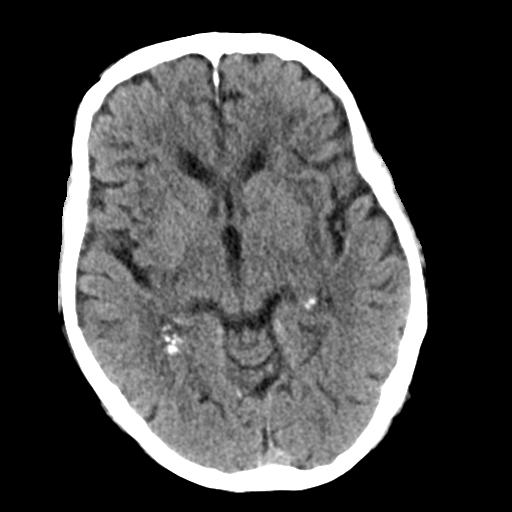
[im 16/31  brain]
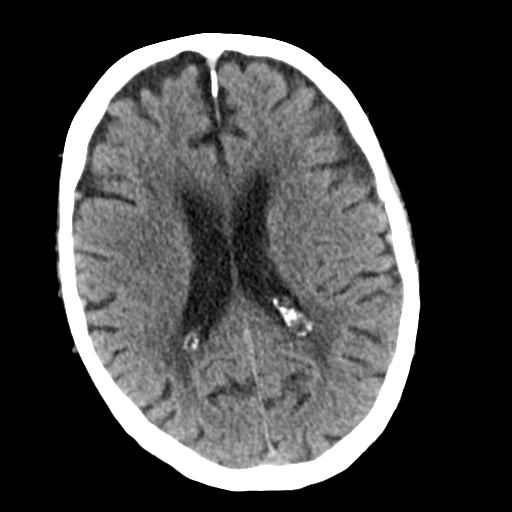
[im 16/31  bone]
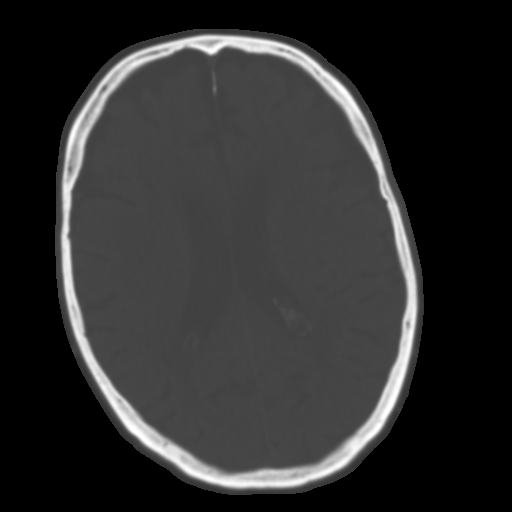
[im 19/31  brain]
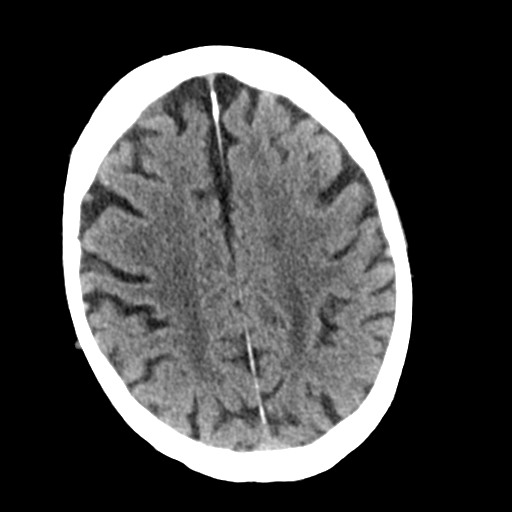
[im 22/31  brain]
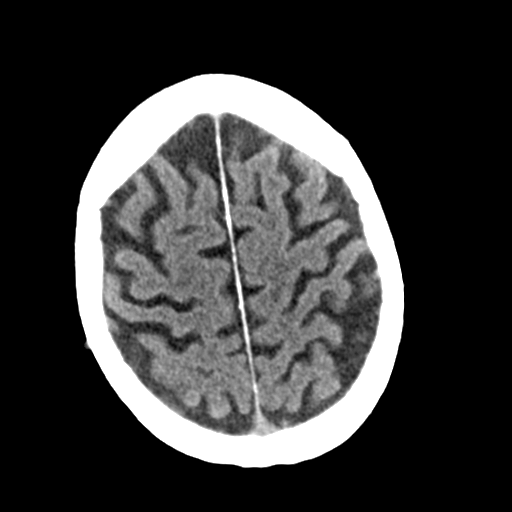
[im 25/31  brain]
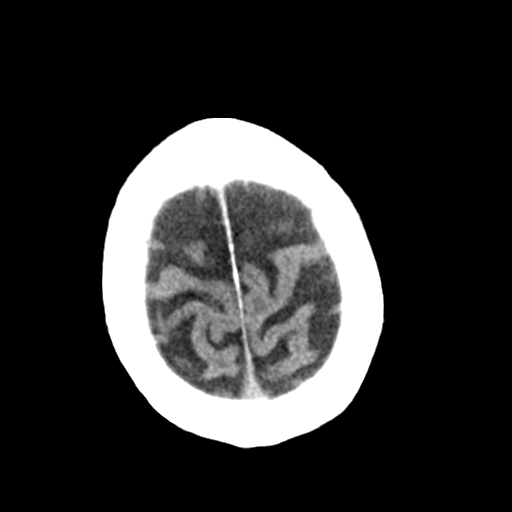
[im 28/31  brain]
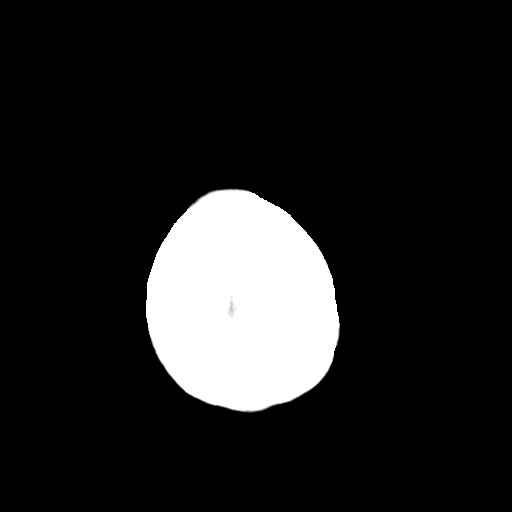
[im 28/31  bone]
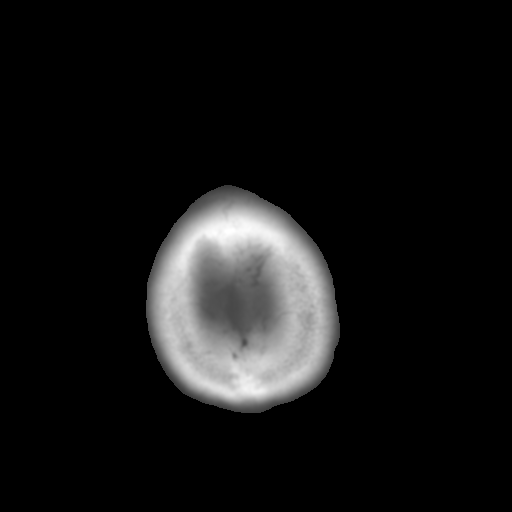

[Series 4: coronal soft tissue · coronal · 0.30mm/px · 3 of 67 slices shown]
[im 23/67  brain]
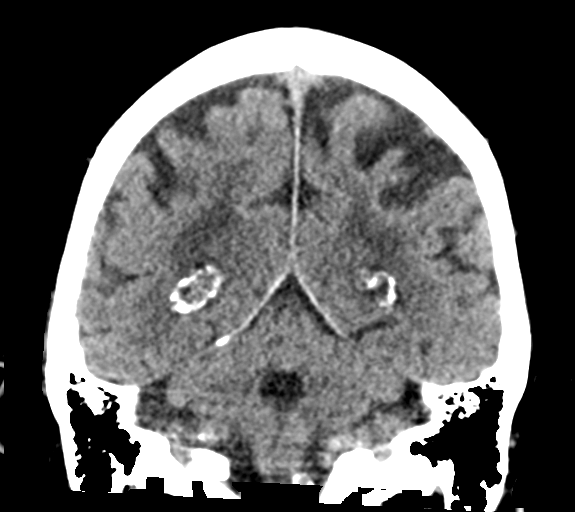
[im 30/67  brain]
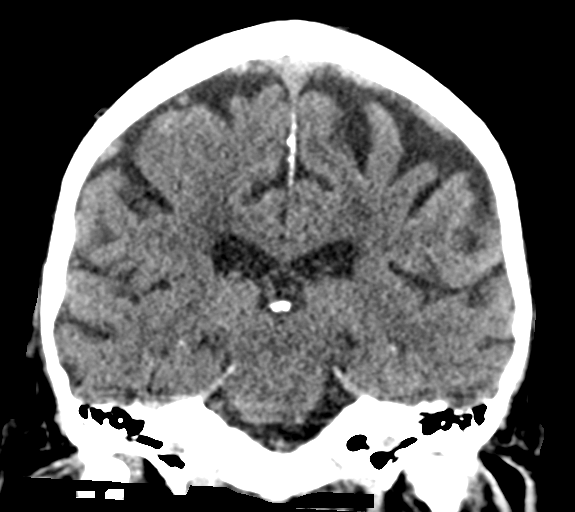
[im 37/67  brain]
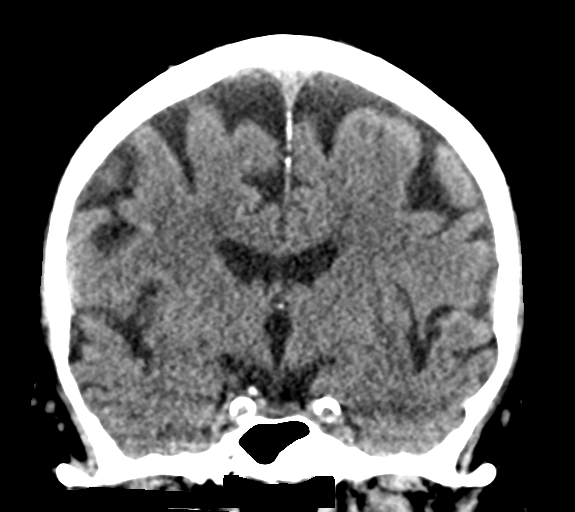

[Series 5: sagittal soft tissue · sagittal · 0.32mm/px · 3 of 67 slices shown]
[im 23/67  brain]
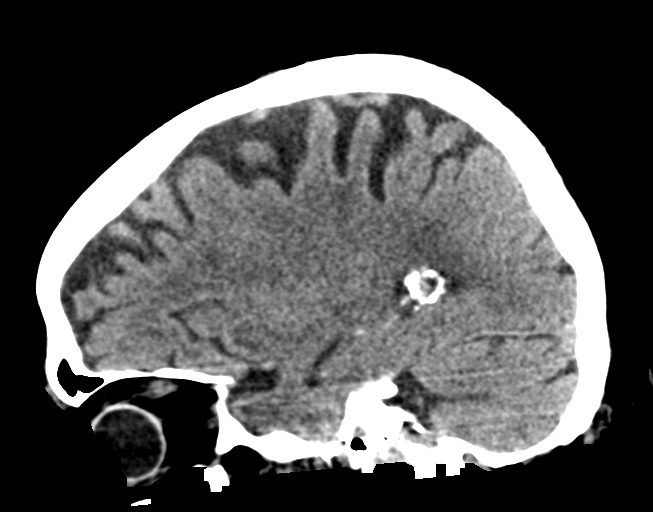
[im 34/67  brain]
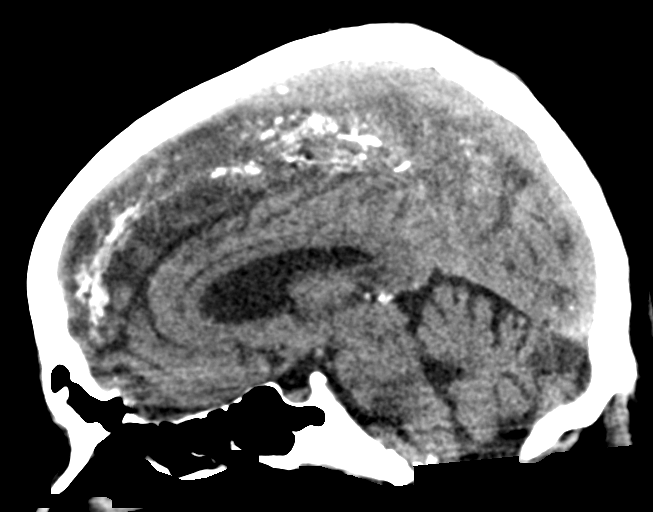
[im 45/67  brain]
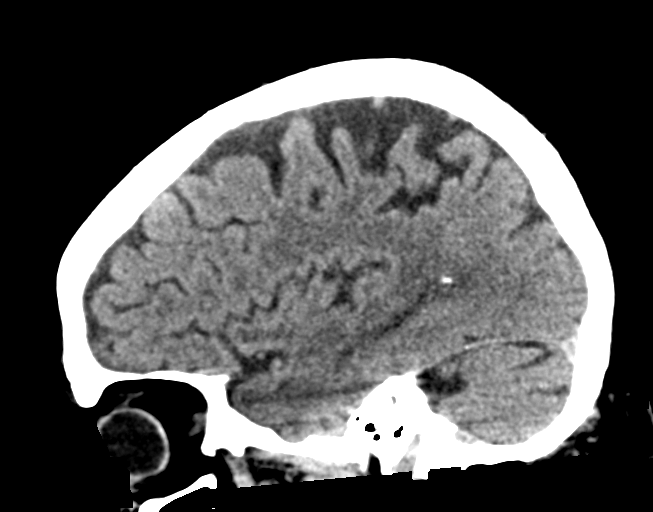

[15 of 47 positions shown; findings below may reference images not displayed]

FINDINGS: Brain: There is no evidence of acute infarct, intracranial
hemorrhage, mass, midline shift, or extra-axial fluid collection.
Cerebral white matter hypodensities are stable to mildly increased
and nonspecific but compatible with mild-to-moderate chronic small
vessel ischemic disease. Mild cerebral atrophy is not greater than
expected for age.

Vascular: Calcified atherosclerosis at the skull base. No hyperdense
vessel.

Skull: No fracture or focal osseous lesion.

Sinuses/Orbits: Visualized paranasal sinuses and mastoid air cells
are clear. Bilateral cataract extraction is noted.

Other: None.
IMPRESSION: 1. No evidence of acute intracranial abnormality.
2. Mild-to-moderate chronic small vessel ischemic disease.

## 2020-03-07 DIAGNOSIS — L97522 Non-pressure chronic ulcer of other part of left foot with fat layer exposed: Secondary | ICD-10-CM | POA: Diagnosis not present

## 2020-03-07 DIAGNOSIS — G609 Hereditary and idiopathic neuropathy, unspecified: Secondary | ICD-10-CM | POA: Diagnosis not present

## 2020-03-08 ENCOUNTER — Other Ambulatory Visit: Payer: Self-pay

## 2020-03-08 ENCOUNTER — Encounter: Payer: Self-pay | Admitting: Urology

## 2020-03-08 ENCOUNTER — Ambulatory Visit (INDEPENDENT_AMBULATORY_CARE_PROVIDER_SITE_OTHER): Payer: PPO | Admitting: Urology

## 2020-03-08 VITALS — BP 85/59 | HR 103 | Ht 76.0 in | Wt 185.4 lb

## 2020-03-08 DIAGNOSIS — N201 Calculus of ureter: Secondary | ICD-10-CM

## 2020-03-08 DIAGNOSIS — N200 Calculus of kidney: Secondary | ICD-10-CM | POA: Diagnosis not present

## 2020-03-08 NOTE — H&P (View-Only) (Signed)
03/08/20 1:36 PM   Kristen Cardinal 12/31/1930 [id number]  CC: Left distal ureteral stone  HPI: I saw Mr. Andres Gutierrez and his daughter in urology clinic today for evaluation of a left distal ureteral stone.  He is a very comorbid 86 year old male with a history of cardiomyopathy, stroke, CKD, and CAD who was hospitalized multiple times over the last month for a hemorrhagic stroke, as well as numbness and tingling of his extremities.  He has also had intermittent abdominal pain and some left upper quadrant abdominal pain over the last year.  A CT performed during recent admission on 03/03/2020 showed a 3 mm left distal ureteral stone with no hydronephrosis or periureteral stranding, and urinalysis was completely benign.  Interestingly, the stone was present as far back as a CT performed in November 2020.  Looking further back, the CT from October 2018 showed no ureteral stones, but the left-sided 3 mm stone was located in the left lower pole without hydronephrosis.  A CT in June 2020 does not comment on any ureteral stones, and I am unable to personally review those films.  He denies any gross hematuria or UTIs.  He has passed a few stones spontaneously in the past, but never required surgical intervention.  He underwent an uncomplicated robotic cholecystectomy in June 2021 and recovered well.  PMH: Past Medical History:  Diagnosis Date  . Anemia   . Anxiety   . Cardiomyopathy ([place])   . Cerebral amyloid angiopathy ([place])   . CHF (congestive heart failure) ([place])   . Chronic kidney disease    kidney stones  . Coronary artery disease   . Cough   . Dyspnea   . Hypertension   . Lymphadenopathy, hilar   . MI (myocardial infarction) ([place])    x 2  . Wheezing     Surgical History: Past Surgical History:  Procedure Laterality Date  . BRONCHIAL NEEDLE ASPIRATION BIOPSY N/A 12/31/2014   Procedure: BRONCHIAL NEEDLE ASPIRATION BIOPSIES from [facility]ina;  Surgeon: Flora Lipps, MD;  Location: [facility];   Service: Cardiopulmonary;  Laterality: N/A;  . CARDIAC CATHETERIZATION N/A 12/28/2015   Procedure: Left Heart Cath and Cors/Grafts Angiography;  Surgeon: Yolonda Kida, MD;  Location: [place] [facility];  Service: Cardiovascular;  Laterality: N/A;  . CATARACT EXTRACTION W/PHACO Left 08/01/2016   Procedure: CATARACT EXTRACTION PHACO AND INTRAOCULAR LENS PLACEMENT ([place]) Left;  Surgeon: Leandrew Koyanagi, MD;  Location: [place];  Service: Ophthalmology;  Laterality: Left;  . COLON SURGERY    . CORONARY ANGIOPLASTY WITH STENT PLACEMENT    . CORONARY ARTERY BYPASS GRAFT    . ENDOBRONCHIAL ULTRASOUND N/A 12/31/2014   Procedure: ENDOBRONCHIAL ULTRASOUND;  Surgeon: Flora Lipps, MD;  Location: [facility];  Service: Cardiopulmonary;  Laterality: N/A;  . ESOPHAGOGASTRODUODENOSCOPY (EGD) WITH PROPOFOL N/A 06/20/2015   Procedure: ESOPHAGOGASTRODUODENOSCOPY (EGD) WITH PROPOFOL;  Surgeon: Lollie Sails, MD;  Location: [facility] ENDOSCOPY;  Service: Endoscopy;  Laterality: N/A;  . IR CATHETER TUBE CHANGE  10/26/2019  . IR CHOLANGIOGRAM EXISTING TUBE  08/13/2019    Family History: Family History  Problem Relation Age of Onset  . CAD Mother   . Colon cancer Father     Social History:  reports that he quit smoking about 42 years ago. His smokeless tobacco use includes chew. He reports current alcohol use. He reports that he does not use drugs.  Physical Exam: BP (!) 85/59 (BP Location: Left Arm, Patient Position: Sitting, Cuff Size: Normal)   Pulse (!) 103  Ht 6\' 4"  (1.93 m)   Wt 185 lb 6.4 oz (84.1 kg)   BMI 22.57 kg/m    Constitutional:  Alert and oriented, No acute distress. Cardiovascular: No clubbing, cyanosis, or edema. Respiratory: Normal respiratory effort, no increased work of breathing. GI: Abdomen is soft, nontender, nondistended, no abdominal masses  Laboratory Data: Reviewed, see HPI  Pertinent Imaging: I have personally reviewed the prior CT scans over the last 2 years,  see HPI for details  Assessment & Plan:   He is an 86 year old comorbid male with intermittent abdominal and left-sided abdominal pain as well as CKD has had a 3 mm left distal ureteral stone without hydronephrosis since at least November 2020 on CT, and confirmed on most recent CT on 03/03/2020.  We discussed various treatment options for urolithiasis including observation with or without medical expulsive therapy, shockwave lithotripsy (SWL), ureteroscopy and laser lithotripsy with stent placement, and percutaneous nephrolithotomy.  We discussed that management is based on stone size, location, density, patient co-morbidities, and patient preference.   Stones <67mm in size have a >80% spontaneous passage rate. Data surrounding the use of tamsulosin for medical expulsive therapy is controversial, but meta analyses suggests it is most efficacious for distal stones between 5-41mm in size. Possible side effects include dizziness/lightheadedness, and retrograde ejaculation.  Ureteroscopy with laser lithotripsy and stent placement has a higher stone free rate than SWL in a single procedure, however increased complication rate including possible infection, ureteral injury, bleeding, and stent related morbidity. Common stent related symptoms include dysuria, urgency/frequency, and flank pain.  We specifically discussed the risks ureteroscopy including bleeding, infection/sepsis, stent related symptoms including flank pain/urgency/frequency/incontinence/dysuria, ureteral injury, inability to access stone, or need for staged or additional procedures.  With his history of intermittent abdominal and left upper abdominal pain with multiple ED visits over the last year, as well as CKD, I recommended intervention with ureteroscopy, laser lithotripsy, and stent placement for his 3 mm left distal ureteral stone.  With his age and comorbidities he is hesitant to proceed, and would like to discuss further with his PCP  next week prior to scheduling surgery.  Virtual visit scheduled next week after PCP visit to discuss further.  Nickolas Madrid, MD 03/08/2020  [facility] [address] [place], [place] [address] [phone]

## 2020-03-08 NOTE — Progress Notes (Signed)
03/08/20 1:36 PM   Kristen Cardinal 06-Dec-1930 [id number]  CC: Left distal ureteral stone  HPI: I saw Mr. Andres Gutierrez and his daughter in urology clinic today for evaluation of a left distal ureteral stone.  He is a very comorbid 86 year old male with a history of cardiomyopathy, stroke, CKD, and CAD who was hospitalized multiple times over the last month for a hemorrhagic stroke, as well as numbness and tingling of his extremities.  He has also had intermittent abdominal pain and some left upper quadrant abdominal pain over the last year.  A CT performed during recent admission on 03/03/2020 showed a 3 mm left distal ureteral stone with no hydronephrosis or periureteral stranding, and urinalysis was completely benign.  Interestingly, the stone was present as far back as a CT performed in November 2020.  Looking further back, the CT from October 2018 showed no ureteral stones, but the left-sided 3 mm stone was located in the left lower pole without hydronephrosis.  A CT in June 2020 does not comment on any ureteral stones, and I am unable to personally review those films.  He denies any gross hematuria or UTIs.  He has passed a few stones spontaneously in the past, but never required surgical intervention.  He underwent an uncomplicated robotic cholecystectomy in June 2021 and recovered well.  PMH: Past Medical History:  Diagnosis Date   Anemia    Anxiety    Cardiomyopathy ([place])    Cerebral amyloid angiopathy ([facility])    CHF (congestive heart failure) ([facility])    Chronic kidney disease    kidney stones   Coronary artery disease    Cough    Dyspnea    Hypertension    Lymphadenopathy, hilar    MI (myocardial infarction) ([place])    x 2   Wheezing     Surgical History: Past Surgical History:  Procedure Laterality Date   BRONCHIAL NEEDLE ASPIRATION BIOPSY N/A 12/31/2014   Procedure: BRONCHIAL NEEDLE ASPIRATION BIOPSIES from [facility]ina;  Surgeon: Flora Lipps, MD;  Location: [facility] OR[facility];   Service: Cardiopulmonary;  Laterality: N/A;   CARDIAC CATHETERIZATION N/A 12/28/2015   Procedure: Left Heart Cath and Cors/Grafts Angiography;  Surgeon: Yolonda Kida, MD;  Location: [place] CV LAB;  Service: Cardiovascular;  Laterality: N/A;   CATARACT EXTRACTION W/PHACO Left 08/01/2016   Procedure: CATARACT EXTRACTION PHACO AND INTRAOCULAR LENS PLACEMENT ([place]) Left;  Surgeon: Leandrew Koyanagi, MD;  Location: [place];  Service: Ophthalmology;  Laterality: Left;   COLON SURGERY     CORONARY ANGIOPLASTY WITH STENT PLACEMENT     CORONARY ARTERY BYPASS GRAFT     ENDOBRONCHIAL ULTRASOUND N/A 12/31/2014   Procedure: ENDOBRONCHIAL ULTRASOUND;  Surgeon: Flora Lipps, MD;  Location: [facility];  Service: Cardiopulmonary;  Laterality: N/A;   ESOPHAGOGASTRODUODENOSCOPY (EGD) WITH PROPOFOL N/A 06/20/2015   Procedure: ESOPHAGOGASTRODUODENOSCOPY (EGD) WITH PROPOFOL;  Surgeon: Lollie Sails, MD;  Location: [facility] ENDOSCOPY;  Service: Endoscopy;  Laterality: N/A;   IR CATHETER TUBE CHANGE  10/26/2019   IR CHOLANGIOGRAM EXISTING TUBE  08/13/2019    Family History: Family History  Problem Relation Age of Onset   CAD Mother    Colon cancer Father     Social History:  reports that he quit smoking about 42 years ago. His smokeless tobacco use includes chew. He reports current alcohol use. He reports that he does not use drugs.  Physical Exam: BP (!) 85/59 (BP Location: Left Arm, Patient Position: Sitting, Cuff Size: Normal)    Pulse (!) 103  Ht 6\' 4"  (1.93 m)    Wt 185 lb 6.4 oz (84.1 kg)    BMI 22.57 kg/m    Constitutional:  Alert and oriented, No acute distress. Cardiovascular: No clubbing, cyanosis, or edema. Respiratory: Normal respiratory effort, no increased work of breathing. GI: Abdomen is soft, nontender, nondistended, no abdominal masses  Laboratory Data: Reviewed, see HPI  Pertinent Imaging: I have personally reviewed the prior CT scans over the last 2 years,  see HPI for details  Assessment & Plan:   He is an 86 year old comorbid male with intermittent abdominal and left-sided abdominal pain as well as CKD has had a 3 mm left distal ureteral stone without hydronephrosis since at least November 2020 on CT, and confirmed on most recent CT on 03/03/2020.  We discussed various treatment options for urolithiasis including observation with or without medical expulsive therapy, shockwave lithotripsy (SWL), ureteroscopy and laser lithotripsy with stent placement, and percutaneous nephrolithotomy.  We discussed that management is based on stone size, location, density, patient co-morbidities, and patient preference.   Stones <55mm in size have a >80% spontaneous passage rate. Data surrounding the use of tamsulosin for medical expulsive therapy is controversial, but meta analyses suggests it is most efficacious for distal stones between 5-29mm in size. Possible side effects include dizziness/lightheadedness, and retrograde ejaculation.  Ureteroscopy with laser lithotripsy and stent placement has a higher stone free rate than SWL in a single procedure, however increased complication rate including possible infection, ureteral injury, bleeding, and stent related morbidity. Common stent related symptoms include dysuria, urgency/frequency, and flank pain.  We specifically discussed the risks ureteroscopy including bleeding, infection/sepsis, stent related symptoms including flank pain/urgency/frequency/incontinence/dysuria, ureteral injury, inability to access stone, or need for staged or additional procedures.  With his history of intermittent abdominal and left upper abdominal pain with multiple ED visits over the last year, as well as CKD, I recommended intervention with ureteroscopy, laser lithotripsy, and stent placement for his 3 mm left distal ureteral stone.  With his age and comorbidities he is hesitant to proceed, and would like to discuss further with his PCP  next week prior to scheduling surgery.  Virtual visit scheduled next week after PCP visit to discuss further.  Nickolas Madrid, MD 03/08/2020  [facility] [address], [place], [place] [address] [phone]

## 2020-03-08 NOTE — Patient Instructions (Signed)
Laser Therapy for Kidney Stones Laser therapy for kidney stones is a procedure to break up small, hard mineral deposits that form in the kidney (kidney stones). The procedure is done using a device that produces a focused beam of light (laser). The laser breaks up kidney stones into pieces that are small enough to be passed out of the body through urination or removed from the body during the procedure. You may need laser therapy if you have kidney stones that are painful or block your urinary tract. This procedure is done by inserting a tube (ureteroscope) into your kidney through the urethral opening. The urethra is the part of the body that drains urine from the bladder. In women, the urethra opens above the vaginal opening. In men, the urethra opens at the tip of the penis. The ureteroscope is inserted through the urethra, and surgical instruments are moved through the bladder and the muscular tube that connects the kidney to the bladder (ureter) until they reach the kidney. Tell a health care provider about:  Any allergies you have.  All medicines you are taking, including vitamins, herbs, eye drops, creams, and over-the-counter medicines.  Any problems you or family members have had with anesthetic medicines.  Any blood disorders you have.  Any surgeries you have had.  Any medical conditions you have.  Whether you are pregnant or may be pregnant. What are the risks? Generally, this is a safe procedure. However, problems may occur, including:  Infection.  Bleeding.  Allergic reactions to medicines.  Damage to the urethra, bladder, or ureter.  Urinary tract infection (UTI).  Narrowing of the urethra (urethral stricture).  Difficulty passing urine.  Blockage of the kidney caused by a fragment of kidney stone. What happens before the procedure? Medicines  Ask your health care provider about: ? Changing or stopping your regular medicines. This is especially important if you  are taking diabetes medicines or blood thinners. ? Taking medicines such as aspirin and ibuprofen. These medicines can thin your blood. Do not take these medicines unless your health care provider tells you to take them. ? Taking over-the-counter medicines, vitamins, herbs, and supplements. Eating and drinking Follow instructions from your health care provider about eating and drinking, which may include:  8 hours before the procedure - stop eating heavy meals or foods, such as meat, fried foods, or fatty foods.  6 hours before the procedure - stop eating light meals or foods, such as toast or cereal.  6 hours before the procedure - stop drinking milk or drinks that contain milk.  2 hours before the procedure - stop drinking clear liquids. Staying hydrated Follow instructions from your health care provider about hydration, which may include:  Up to 2 hours before the procedure - you may continue to drink clear liquids, such as water, clear fruit juice, black coffee, and plain tea.  General instructions  You may have a physical exam before the procedure. You may also have tests, such as imaging tests and blood or urine tests.  If your ureter is too narrow, your health care provider may place a soft, flexible tube (stent) inside of it. The stent may be placed days or weeks before your laser therapy procedure.  Plan to have someone take you home from the hospital or clinic.  If you will be going home right after the procedure, plan to have someone stay with you for 24 hours.  Do not use any products that contain nicotine or tobacco for at least 4   weeks before the procedure. These products include cigarettes, e-cigarettes, and chewing tobacco. If you need help quitting, ask your health care provider.  Ask your health care provider: ? How your surgical site will be marked or identified. ? What steps will be taken to help prevent infection. These may include:  Removing hair at the surgery  site.  Washing skin with a germ-killing soap.  Taking antibiotic medicine. What happens during the procedure?   An IV will be inserted into one of your veins.  You will be given one or more of the following: ? A medicine to help you relax (sedative). ? A medicine to numb the area (local anesthetic). ? A medicine to make you fall asleep (general anesthetic).  A ureteroscope will be inserted into your urethra. The ureteroscope will send images to a video screen in the operating room to guide your surgeon to the area of your kidney that will be treated.  A small, flexible tube will be threaded through the ureteroscope and into your bladder and ureter, up to your kidney.  The laser device will be inserted into your kidney through the tube. Your surgeon will pulse the laser on and off to break up kidney stones.  A surgical instrument that has a tiny wire basket may be inserted through the tube into your kidney to remove the pieces of broken kidney stone. The procedure may vary among health care providers and hospitals. What happens after the procedure?  Your blood pressure, heart rate, breathing rate, and blood oxygen level will be monitored until you leave the hospital or clinic.  You will be given pain medicine as needed.  You may continue to receive antibiotics.  You may have a stent temporarily placed in your ureter.  Do not drive for 24 hours if you were given a sedative during your procedure.  You may be given a strainer to collect any stone fragments that you pass in your urine. Your health care provider may have these tested. Summary  Laser therapy for kidney stones is a procedure to break up kidney stones into pieces that are small enough to be passed out of the body through urination or removed during the procedure.  Follow instructions from your health care provider about eating and drinking before the procedure.  During the procedure, the ureteroscope will send images  to a video screen to guide your surgeon to the area of your kidney that will be treated.  Do not drive for 24 hours if you were given a sedative during your procedure. This information is not intended to replace advice given to you by your health care provider. Make sure you discuss any questions you have with your health care provider. Document Revised: 01/09/2018 Document Reviewed: 01/09/2018 Elsevier Patient Education  2020 [employer]   Ureteral Stent Implantation  Ureteral stent implantation is a procedure to insert (implant) a flexible, soft, plastic tube (stent) into a ureter. Ureters are the tube-like parts of the body that drain urine from the kidneys. The stent supports the ureter while it heals and helps to drain urine. You may have a ureteral stent implanted after having a procedure to remove a blockage from the ureter (ureterolysis or pyeloplasty). You may also have a stent implanted to open the flow of urine when you have a blockage caused by a kidney stone, tumor, blood clot, or infection. You have two ureters, one on each side of the body. The ureters connect the kidneys to the organ that holds urine   until it passes out of the body (bladder). The stent is placed so that one end is in the kidney, and one end is in the bladder. The stent is usually taken out after your ureter has healed. Depending on your condition, you may have a stent for just a few weeks, or you may have a long-term stent that will need to be replaced every few months. Tell a health care provider about:  Any allergies you have.  All medicines you are taking, including vitamins, herbs, eye drops, creams, and over-the-counter medicines.  Any problems you or family members have had with anesthetic medicines.  Any blood disorders you have.  Any surgeries you have had.  Any medical conditions you have.  Whether you are pregnant or may be pregnant. What are the risks? Generally, this is a safe procedure.  However, problems may occur, including:  Infection.  Bleeding.  Allergic reactions to medicines.  Damage to other structures or organs. Tearing (perforation) of the ureter is possible.  Movement of the stent away from where it is placed during surgery (migration). What happens before the procedure? Medicines Ask your health care provider about:  Changing or stopping your regular medicines. This is especially important if you are taking diabetes medicines or blood thinners.  Taking medicines such as aspirin and ibuprofen. These medicines can thin your blood. Do not take these medicines unless your health care provider tells you to take them.  Taking over-the-counter medicines, vitamins, herbs, and supplements. Eating and drinking Follow instructions from your health care provider about eating and drinking, which may include:  8 hours before the procedure - stop eating heavy meals or foods, such as meat, fried foods, or fatty foods.  6 hours before the procedure - stop eating light meals or foods, such as toast or cereal.  6 hours before the procedure - stop drinking milk or drinks that contain milk.  2 hours before the procedure - stop drinking clear liquids. Staying hydrated Follow instructions from your health care provider about hydration, which may include:  Up to 2 hours before the procedure - you may continue to drink clear liquids, such as water, clear fruit juice, black coffee, and plain tea. General instructions  Do not drink alcohol.  Do not use any products that contain nicotine or tobacco for at least 4 weeks before the procedure. These products include cigarettes, e-cigarettes, and chewing tobacco. If you need help quitting, ask your health care provider.  You may have an exam or testing, such as imaging or blood tests.  Ask your health care provider what steps will be taken to help prevent infection. These may include: ? Removing hair at the surgery  site. ? Washing skin with a germ-killing soap. ? Taking antibiotic medicine.  Plan to have someone take you home from the hospital or clinic.  If you will be going home right after the procedure, plan to have someone with you for 24 hours. What happens during the procedure?  An IV will be inserted into one of your veins.  You may be given a medicine to help you relax (sedative).  You may be given a medicine to make you fall asleep (general anesthetic).  A thin, tube-shaped instrument with a light and tiny camera at the end (cystoscope) will be inserted into your urethra. The urethra is the tube that drains urine from the bladder out of the body. In men, the urethra opens at the end of the penis. In women, the urethra opens   in front of the vaginal opening.  The cystoscope will be passed into your bladder.  A thin wire (guide wire) will be passed through your bladder and into your ureter. This is used to guide the stent into your ureter.  The stent will be inserted into your ureter.  The guide wire and the cystoscope will be removed.  A flexible tube (catheter) may be inserted through your urethra so that one end is in your bladder. This helps to drain urine from your bladder. The procedure may vary among hospitals and health care providers. What happens after the procedure?  Your blood pressure, heart rate, breathing rate, and blood oxygen level will be monitored until you leave the hospital or clinic.  You may continue to receive medicine and fluids through an IV.  You may have some soreness or pain in your abdomen and urethra. Medicines will be available to help you.  You will be encouraged to get up and walk around as soon as you can.  You may have a catheter draining your urine.  You will have some blood in your urine.  Do not drive for 24 hours if you were given a sedative during your procedure. Summary  Ureteral stent implantation is a procedure to insert a flexible,  soft, plastic tube (stent) into a ureter.  You may have a stent implanted to support the ureter while it heals after a procedure or to open the flow of urine if there is a blockage.  Follow instructions from your health care provider about taking medicines and about eating and drinking before the procedure.  Depending on your condition, you may have a stent for just a few weeks, or you may have a long-term stent that will need to be replaced every few months. This information is not intended to replace advice given to you by your health care provider. Make sure you discuss any questions you have with your health care provider. Document Revised: 02/04/2018 Document Reviewed: 02/05/2018 Elsevier Patient Education  2020 [employer]   

## 2020-03-10 ENCOUNTER — Other Ambulatory Visit: Payer: Self-pay

## 2020-03-10 ENCOUNTER — Inpatient Hospital Stay: Payer: PPO

## 2020-03-10 VITALS — BP 101/51 | HR 66

## 2020-03-10 DIAGNOSIS — I129 Hypertensive chronic kidney disease with stage 1 through stage 4 chronic kidney disease, or unspecified chronic kidney disease: Secondary | ICD-10-CM | POA: Diagnosis not present

## 2020-03-10 DIAGNOSIS — D631 Anemia in chronic kidney disease: Secondary | ICD-10-CM

## 2020-03-10 DIAGNOSIS — D500 Iron deficiency anemia secondary to blood loss (chronic): Secondary | ICD-10-CM

## 2020-03-10 DIAGNOSIS — N183 Chronic kidney disease, stage 3 unspecified: Secondary | ICD-10-CM

## 2020-03-10 LAB — CBC WITH DIFFERENTIAL/PLATELET
Abs Immature Granulocytes: 0.09 10*3/uL — ABNORMAL HIGH (ref 0.00–0.07)
Basophils Absolute: 0.1 10*3/uL (ref 0.0–0.1)
Basophils Relative: 1 %
Eosinophils Absolute: 0.1 10*3/uL (ref 0.0–0.5)
Eosinophils Relative: 1 %
HCT: 29.5 % — ABNORMAL LOW (ref 39.0–52.0)
Hemoglobin: 9.8 g/dL — ABNORMAL LOW (ref 13.0–17.0)
Immature Granulocytes: 1 %
Lymphocytes Relative: 29 %
Lymphs Abs: 3.1 10*3/uL (ref 0.7–4.0)
MCH: 35.5 pg — ABNORMAL HIGH (ref 26.0–34.0)
MCHC: 33.2 g/dL (ref 30.0–36.0)
MCV: 106.9 fL — ABNORMAL HIGH (ref 80.0–100.0)
Monocytes Absolute: 2.7 10*3/uL — ABNORMAL HIGH (ref 0.1–1.0)
Monocytes Relative: 25 %
Neutro Abs: 4.8 10*3/uL (ref 1.7–7.7)
Neutrophils Relative %: 43 %
Platelets: 136 10*3/uL — ABNORMAL LOW (ref 150–400)
RBC: 2.76 MIL/uL — ABNORMAL LOW (ref 4.22–5.81)
RDW: 16.1 % — ABNORMAL HIGH (ref 11.5–15.5)
Smear Review: NORMAL
WBC: 10.8 10*3/uL — ABNORMAL HIGH (ref 4.0–10.5)
nRBC: 0 % (ref 0.0–0.2)

## 2020-03-10 LAB — VITAMIN B1: Vitamin B1 (Thiamine): 171.7 nmol/L (ref 66.5–200.0)

## 2020-03-10 MED ORDER — DARBEPOETIN ALFA 300 MCG/0.6ML IJ SOSY
300.0000 ug | PREFILLED_SYRINGE | Freq: Once | INTRAMUSCULAR | Status: AC
  Administered 2020-03-10: 300 ug via SUBCUTANEOUS
  Filled 2020-03-10: qty 0.6

## 2020-03-14 DIAGNOSIS — E869 Volume depletion, unspecified: Secondary | ICD-10-CM | POA: Diagnosis not present

## 2020-03-14 DIAGNOSIS — R55 Syncope and collapse: Secondary | ICD-10-CM | POA: Diagnosis not present

## 2020-03-14 DIAGNOSIS — I420 Dilated cardiomyopathy: Secondary | ICD-10-CM | POA: Diagnosis not present

## 2020-03-15 ENCOUNTER — Other Ambulatory Visit: Payer: Self-pay

## 2020-03-15 ENCOUNTER — Telehealth (INDEPENDENT_AMBULATORY_CARE_PROVIDER_SITE_OTHER): Payer: PPO | Admitting: Urology

## 2020-03-15 DIAGNOSIS — N201 Calculus of ureter: Secondary | ICD-10-CM | POA: Diagnosis not present

## 2020-03-15 NOTE — Progress Notes (Signed)
Virtual Visit via Telephone Note  I connected with Andres Gutierrez on 03/15/20 at  2:30 PM EDT by telephone and verified that I am speaking with the correct person using two identifiers.   Patient location: Home Provider location: [facility] office location)   I discussed the limitations, risks, security and privacy concerns of performing an evaluation and management service by telephone and the availability of in person appointments. We discussed the impact of the COVID-19 pandemic on the healthcare system, and the importance of social distancing and reducing patient and provider exposure. I also discussed with the patient that there may be a patient responsible charge related to this service. The patient expressed understanding and agreed to proceed.  Reason for visit: Left 79m distal ureteral stone  History of Present Illness: I connected with Mr. PDelacruzand his daughter for follow-up today.  Briefly, 89year old comorbid male who has had a 3 mm left distal ureteral stone without any significant hydronephrosis for at least 1 year.  He has had intermittent left-sided flank and abdominal pain resulting in multiple ER visits during that time, as well as baseline CKD.  At our last visit, I recommended intervention with ureteroscopy, but he was unsure how he would like to proceed with his age and comorbidities.  He met with his primary care physician yesterday who also encouraged him to pursue ureteroscopy.  At this point, he would like to pursue treatment of his left-sided distal ureteral stone.  We specifically discussed the risks ureteroscopy including bleeding, infection/sepsis, stent related symptoms including flank pain/urgency/frequency/incontinence/dysuria, ureteral injury, inability to access stone, or need for staged or additional procedures.  We also discussed that observation is an option, but with his history of ER visits for abdominal and left-sided flank pain, as well as  his CKD, I would not recommend continuing observation for his left distal ureteral stone in the setting of being present in the ureter for over 1 year.  Follow Up: Schedule left ureteroscopy, laser lithotripsy, stent placement   I discussed the assessment and treatment plan with the patient. The patient was provided an opportunity to ask questions and all were answered. The patient agreed with the plan and demonstrated an understanding of the instructions.   The patient was advised to call back or seek an in-person evaluation if the symptoms worsen or if the condition fails to improve as anticipated.  I provided 14 minutes of non-face-to-face time during this encounter.   BBilley Co MD

## 2020-03-16 ENCOUNTER — Other Ambulatory Visit: Payer: Self-pay | Admitting: Radiology

## 2020-03-16 ENCOUNTER — Other Ambulatory Visit
Admission: RE | Admit: 2020-03-16 | Discharge: 2020-03-16 | Disposition: A | Discharge status: Home / Self Care | Payer: PPO | Source: Ambulatory Visit | Attending: Urology | Admitting: Urology

## 2020-03-16 ENCOUNTER — Other Ambulatory Visit: Payer: Self-pay | Admitting: Urology

## 2020-03-16 ENCOUNTER — Other Ambulatory Visit: Payer: Self-pay

## 2020-03-16 DIAGNOSIS — Z20822 Contact with and (suspected) exposure to covid-19: Secondary | ICD-10-CM | POA: Diagnosis not present

## 2020-03-16 DIAGNOSIS — Z01812 Encounter for preprocedural laboratory examination: Secondary | ICD-10-CM | POA: Insufficient documentation

## 2020-03-16 DIAGNOSIS — N201 Calculus of ureter: Secondary | ICD-10-CM

## 2020-03-16 LAB — SARS CORONAVIRUS 2 (TAT 6-24 HRS): SARS Coronavirus 2: NEGATIVE

## 2020-03-17 ENCOUNTER — Encounter
Admission: RE | Admit: 2020-03-17 | Discharge: 2020-03-17 | Disposition: A | Discharge status: Home / Self Care | Payer: PPO | Source: Ambulatory Visit | Attending: Urology | Admitting: Urology

## 2020-03-17 ENCOUNTER — Other Ambulatory Visit: Payer: PPO

## 2020-03-17 HISTORY — DX: Personal history of urinary calculi: Z87.442

## 2020-03-17 HISTORY — DX: Gastro-esophageal reflux disease without esophagitis: K21.9

## 2020-03-17 HISTORY — DX: Angina pectoris, unspecified: I20.9

## 2020-03-17 MED ORDER — CEFAZOLIN SODIUM-DEXTROSE 2-4 GM/100ML-% IV SOLN
2.0000 g | INTRAVENOUS | Status: AC
  Administered 2020-03-18: 2 g via INTRAVENOUS

## 2020-03-17 MED ORDER — LACTATED RINGERS IV SOLN: INTRAVENOUS | Status: DC

## 2020-03-17 MED ORDER — CHLORHEXIDINE GLUCONATE 0.12 % MT SOLN
15.0000 mL | Freq: Once | OROMUCOSAL | Status: AC
  Administered 2020-03-18: 15 mL via OROMUCOSAL

## 2020-03-17 MED ORDER — ORAL CARE MOUTH RINSE: 15.0000 mL | Freq: Once | OROMUCOSAL | Status: AC

## 2020-03-17 NOTE — Patient Instructions (Signed)
Your procedure is scheduled on: 03/18/20 Report to [facility]  To find out your arrival time please call [phone] between 1PM - 3PM on 03/17/20.  Remember: Instructions that are not followed completely may result in serious medical risk, up to and including death, or upon the discretion of your surgeon and anesthesiologist your surgery may need to be rescheduled.     _X__ 1. Do not eat food after midnight the night before your procedure.                 No gum chewing or hard candies. You may drink clear liquids up to 2 hours                 before you are scheduled to arrive for your surgery- DO not drink clear                 liquids within 2 hours of the start of your surgery.                 Clear Liquids include:  water, apple juice without pulp, clear carbohydrate                 drink such as Clearfast or Gatorade, Black Coffee or Tea (Do not add                 anything to coffee or tea). Diabetics water only  __X__2.  On the morning of surgery brush your teeth with toothpaste and water, you                 may rinse your mouth with mouthwash if you wish.  Do not swallow any              toothpaste of mouthwash.     _X__ 3.  No Alcohol for 24 hours before or after surgery.   _X__ 4.  Do Not Smoke or use e-cigarettes For 24 Hours Prior to Your Surgery.                 Do not use any chewable tobacco products for at least 6 hours prior to                 surgery.  ____  5.  Bring all medications with you on the day of surgery if instructed.   __X__  6.  Notify your doctor if there is any change in your medical condition      (cold, fever, infections).     Do not wear jewelry, make-up, hairpins, clips or nail polish. Do not wear lotions, powders, or perfumes.  Do not shave 48 hours prior to surgery. Men may shave face and neck. Do not bring valuables to the hospital.    [facility] is not responsible for any belongings or valuables.  Contacts,  dentures/partials or body piercings may not be worn into surgery. Bring a case for your contacts, glasses or hearing aids, a denture cup will be supplied. Leave your suitcase in the car. After surgery it may be brought to your room. For patients admitted to the hospital, discharge time is determined by your treatment team.   Patients discharged the day of surgery will not be allowed to drive home.   Please read over the following fact sheets that you were given:   MRSA Information  __X__ Take these medicines the morning of surgery with A SIP OF WATER:    1.  digoxin (LANOXIN) 0.0625 MG tablet  2. pantoprazole (PROTONIX) 20 MG tablet  3. ranolazine (RANEXA) 500 MG SR tablet  4.  5.  6.  ____ Fleet Enema (as directed)   ____ Use CHG Soap/SAGE wipes as directed  ____ Use inhalers on the day of surgery  ____ Stop metformin/Janumet/Farxiga 2 days prior to surgery    ____ Take 1/2 of usual insulin dose the night before surgery. No insulin the morning          of surgery.   ____ Stop Blood Thinners Coumadin/Plavix/Xarelto/Pleta/Pradaxa/Eliquis/Effient/Aspirin  on   Or contact your Surgeon, Cardiologist or Medical Doctor regarding  ability to stop your blood thinners  __X__ Stop Anti-inflammatories 7 days before surgery such as Advil, Ibuprofen, Motrin,  BC or Goodies Powder, Naprosyn, Naproxen, Aleve   __X__ Stop all herbal supplements, fish oil or vitamin E until after surgery.    ____ Bring C-Pap to the hospital.    DO NOT TAKE ASPIRIN Friday 03/18/20

## 2020-03-17 NOTE — Progress Notes (Signed)
[facility] Perioperative Services  Pre-Admission/Anesthesia Testing Clinical Review  Date: 03/18/20  Patient Demographics:  Name: Andres Gutierrez DOB:   [birth year] MRN:   [id number]  Planned Surgical Procedure(s):    Case: [id number] Date/Time: 03/18/20 1330   Procedure: CYSTOSCOPY/URETEROSCOPY/HOLMIUM LASER/STENT PLACEMENT (Left )   Anesthesia type: Choice   Pre-op diagnosis: left ureteral stone   Location: [facility] OR ROOM 10 / [facility] FOR ANESTHESIA GROUP   Surgeons: Billey Co, MD     NOTE: Available PAT nursing documentation and vital signs have been reviewed. Clinical nursing staff has updated patient's PMH/PSHx, current medication list, and drug allergies/intolerances to ensure comprehensive history available to assist in medical decision making as it pertains to the aforementioned surgical procedure and anticipated anesthetic course.   Clinical Discussion:  Andres Gutierrez is a 86 y.o. male who is submitted for pre-surgical anesthesia review and clearance prior to him undergoing the above procedure. Patient is a Former Smoker (quit 06/1977). Pertinent PMH includes: CAD (s/p CABG), angina, A. fib with RVR, NSTEMI, chronic systolic heart failure, ischemic cardiomyopathy, cerebral hemorrhage, cerebral amyloid angiopathy, HTN, dyspnea on exertion, wheezing, GERD (on daily PPI), macrocytic anemia, thrombocytopenia CKD-III, urolithiasis, anxiety  Patient is followed by cardiology Andres Glassing, MD). He was last seen in the cardiology clinic on 12/29/2019; notes reviewed.  At the time of his clinic visit, patient was doing well from a cardiovascular standpoint.  He denied chest pain, shortness of breath, PND, orthopnea, palpitations, and presyncope/syncope.  Patient with a history of hypotension and bradycardia, both of which improved with reduction in his beta-blocker dose.  PMH (+) for ischemic cardiomyopathy and NSTEMI.  Last TTE revealed an LVEF of 20-30%.  Subsequent left  heart catheterization revealed 100% proximal RCA, 50% distal left circumflex, 100% mid LAD, and 50% left main stenosis with a proximal 90% LAD (see full interpretation of cardiovascular testing below).  Hypertension well controlled with low-dose beta-blocker and DASH diet.  Patient is on a statin for his HLD.  Despite chronic atrial fibrillation diagnosis, patient is not on daily anticoagulation; rate controlled with beta-blocker and digoxin continued as previously prescribed.  Patient is on daily low-dose ASA therapy. Patient scheduled to follow-up with outpatient cardiology in 6 months.  Patient with 2 separate overnight starts at [facility]; 02/26/2020-02/27/2020 and 03/03/2020 -03/04/2020.  Notes from both hospital courses is reviewed and summarized as follows:    Patient presented to the ED on 02/26/2020 with complaints of a 15-minute episode of generalized weakness and tingling.  Patient was also experiencing ageusia; denied anosmia.  Patient was concerned about potentially having SARS-CoV-2 (novel coronavirus) as he has several friends who are currently infected with a virus.  Patient's SARS-CoV-2 testing was negative.  Patient otherwise denied any physical complaints.  Exam revealed no focal neurological deficits; NIHSS was 0.  He denied chest pain, shortness of breath, and visual disturbances.  CBC revealed a macrocytic anemia; hemoglobin 9.2, hematocrit 27.5, MCV 105.4, and platelets 121.  BUN 30 and creatinine 1.77 mg/dL; all other electrolytes normal.  High-sensitivity troponins were obtained and noted to be mildly elevated at 22 ng/L; downward trend to 21 ng/L 4 hours later.  ECG revealed no evidence of STEMI.  Initial CT imaging of the head revealed no acute intracranial abnormalities.  Neurology was consulted and MRI/MRA was recommended.  MRI revealed small focus of left occipital intraparenchymal hemorrhage that was felt to be likely subacute, in addition to sequela of cerebral amyloid angiopathy.   MRA revealed no large  vessel occlusion, high-grade narrowing, or aneurysm.  TTE was ordered during his admission that revealed moderately reduced LV systolic function with an LVEF of 35-40%; (+) left ventricular RWMAs (see below for full interpretation of cardiovascular testing).  CVA was ruled out. Patient was cleared by neurology and internal medicine, and was ultimately discharged home in stable condition on 02/27/2020 for outpatient follow-up with his PCP.   Patient presented once again to the ED on 03/03/2020; notes reviewed.  Patient presented via EMS with complaints of blurred vision, new onset tremors, generalized weakness, vertiginous symptoms, a nonspecific headache, and pain in his LEFT lower abdomen.  Patient advising that he was seen approximately 1 week ago with similar symptoms.  Patient described his breathing as being "slightly off" since he woke up that morning, however denied recent illness.  Macrocytic anemia persisted; hemoglobin 9.4, hematocrit 28.3, MCV 105.6, platelets 111.  BUN 29 and creatinine 1.85 mg/dL.  Serial high-sensitivity troponins were mildly elevated.  ECG revealed NSR with nonspecific IVCD; mild inferior ST depressions when compared to previous. BNP also elevated at 293.7 pg/mL. SARS-CoV-2 testing was again negative.  Repeat CT imaging of the head was negative.  No changes noted on MRI of the brain.  CVA once again ruled out.  CT imaging of the abdomen pelvis revealed a 2 to 3 mm stone in the distal LEFT ureter without any associated hydroureteronephrosis or periureteral stranding.  Patient scheduled to follow-up with urology on an outpatient basis.  Was discharged home patient on 03/04/2020.  Patient is scheduled to undergo urological procedure on 03/18/2020 with Dr. Nickolas Madrid.  Given patient's past medical history significant for cardiac issues, coupled with his recent ED visits for neurological concerns, presurgical clearance was sought from patient's PCP Andres Heck,  MD), cardiology Andres Glassing, MD), and last attending neurologist who saw the patient during admission Andres Elders, MD).  Clearances for the aforementioned planned urological procedure issued as follows:   Per PCP, "this patient is optimized for surgery and may proceed with the planned procedural course with a LOW risk stratification".    Per cardiology, "patient is optimized for surgery from a cardiovascular perspective. He may proceed with a MODERATE risk for the procedure. Again, this patient is on daily antiplatelet therapy. He has been instructed on recommendations for continuing his daily low dose ASA throughout the perioperative period.    Per neurology (verbal to PAT APP), "Notes from recent hospitalization reviewed. This was a small amyloid bleed.  Patient is safe to proceed with planned procedure based off of previously documented exam".   He denies previous perioperative complications with anesthesia. He underwent a general anesthetic course here (ASA III) in 10/2019 with no documented complications.   Vitals with BMI 03/17/2020 03/10/2020 03/08/2020  Height 6\' 4"  - 6\' 4"   Weight 180 lbs - 185 lbs 6 oz  BMI 46.27 - 03.50  Systolic - 093 85  Diastolic - 51 59  Pulse - 66 103    Providers/Specialists:   NOTE: Primary physician provider listed below. Patient may have been seen by APP or partner within same practice.   PROVIDER ROLE LAST Ranae Pila, MD Urology (Surgeon) 03/15/2020  Rusty Aus, MD Primary Care Provider 03/14/2020  Bartholome Bill, MD Cardiology 12/29/2019  Leotis Pain, MD Neurology - saw in hospital only 02/27/2020   Allergies:  Levaquin [levofloxacin], Escitalopram, 5ht3 receptor antagonists, Diltiazem, Diphenhydramine hcl, Maxidex [dexamethasone], Prednisone, Serotonin, Vytorin [ezetimibe-simvastatin], and Zocor [simvastatin]  Current Home Medications:   . ceFAZolin (ANCEF) IVPB 2g/100  mL premix  . chlorhexidine (PERIDEX) 0.12 % solution 15 mL    Or  . MEDLINE mouth rinse  . lactated ringers infusion   . Ascorbic Acid (VITAMIN C PO)  . aspirin 81 MG EC tablet  . atorvastatin (LIPITOR) 40 MG tablet  . digoxin (LANOXIN) 0.125 MG tablet  . famotidine (PEPCID) 20 MG tablet  . feeding supplement (ENSURE ENLIVE / ENSURE PLUS) LIQD  . ferrous sulfate 325 (65 FE) MG tablet  . furosemide (LASIX) 20 MG tablet  . metoprolol tartrate (LOPRESSOR) 25 MG tablet  . Multiple Vitamin (MULTIVITAMIN) tablet  . neomycin-bacitracin-polymyxin (NEOSPORIN) ointment  . pantoprazole (PROTONIX) 20 MG tablet  . ranolazine (RANEXA) 1000 MG SR tablet  . sucralfate (CARAFATE) 1 G tablet  . tamsulosin (FLOMAX) 0.4 MG CAPS capsule  . vitamin B-12 (CYANOCOBALAMIN) 500 MCG tablet  . PRESCRIPTION MEDICATION   History:   Past Medical History:  Diagnosis Date  . Anemia   . Anginal pain ([place])   . Anxiety   . Cardiomyopathy ([place])   . Cerebral amyloid angiopathy ([place])   . CHF (congestive heart failure) ([place])   . Chronic kidney disease    kidney stones  . Coronary artery disease   . Cough   . Dyspnea   . GERD (gastroesophageal reflux disease)   . History of kidney stones   . Hypertension   . Lymphadenopathy, hilar   . MI (myocardial infarction) ([place])    x 2  . Wheezing    Past Surgical History:  Procedure Laterality Date  . BRONCHIAL NEEDLE ASPIRATION BIOPSY N/A 12/31/2014   Procedure: BRONCHIAL NEEDLE ASPIRATION BIOPSIES from carina;  Surgeon: Flora Lipps, MD;  Location: [facility] ORS;  Service: Cardiopulmonary;  Laterality: N/A;  . CARDIAC CATHETERIZATION N/A 12/28/2015   Procedure: Left Heart Cath and Cors/Grafts Angiography;  Surgeon: Yolonda Kida, MD;  Location: [facility];  Service: Cardiovascular;  Laterality: N/A;  . CATARACT EXTRACTION W/PHACO Left 08/01/2016   Procedure: CATARACT EXTRACTION PHACO AND INTRAOCULAR LENS PLACEMENT ([place]) Left;  Surgeon: Leandrew Koyanagi, MD;  Location: [place];  Service: Ophthalmology;   Laterality: Left;  . CHOLECYSTECTOMY    . COLON SURGERY    . CORONARY ANGIOPLASTY WITH STENT PLACEMENT    . CORONARY ARTERY BYPASS GRAFT    . ENDOBRONCHIAL ULTRASOUND N/A 12/31/2014   Procedure: ENDOBRONCHIAL ULTRASOUND;  Surgeon: Flora Lipps, MD;  Location: [facility] ORS;  Service: Cardiopulmonary;  Laterality: N/A;  . ESOPHAGOGASTRODUODENOSCOPY (EGD) WITH PROPOFOL N/A 06/20/2015   Procedure: ESOPHAGOGASTRODUODENOSCOPY (EGD) WITH PROPOFOL;  Surgeon: Lollie Sails, MD;  Location[facility] ENDOSCOPY;  Service: Endoscopy;  Laterality: N/A;  . IR CATHETER TUBE CHANGE  10/26/2019  . IR CHOLANGIOGRAM EXISTING TUBE  08/13/2019   Family History  Problem Relation Age of Onset  . CAD Mother   . Colon cancer Father    Social History   Tobacco Use  . Smoking status: Former Smoker    Quit date: 06/27/1977    Years since quitting: 42.7  . Smokeless tobacco: Current User    Types: Chew  Vaping Use  . Vaping Use: Never used  Substance Use Topics  . Alcohol use: Yes    Comment: occational almost rare once a year  . Drug use: No    Pertinent Clinical Results:  LABS: Labs reviewed: Acceptable for surgery.  Hospital Outpatient Visit on 03/16/2020  Component Date Value Ref Range Status  . SARS Coronavirus 2 03/16/2020 NEGATIVE  NEGATIVE Final   Comment: (NOTE) SARS-CoV-2 target  nucleic acids are NOT DETECTED.  The SARS-CoV-2 RNA is generally detectable in upper and lower respiratory specimens during the acute phase of infection. Negative results do not preclude SARS-CoV-2 infection, do not rule out co-infections with other pathogens, and should not be used as the sole basis for treatment or other patient management decisions. Negative results must be combined with clinical observations, patient history, and epidemiological information. The expected result is Negative.  Fact Sheet for Patients: SugarRoll.be  Fact Sheet for Healthcare  Providers: https://www.woods-mathews.com/  This test is not yet approved or cleared by the [place] FDA and  has been authorized for detection and/or diagnosis of SARS-CoV-2 by FDA under an Emergency Use Authorization (EUA). This EUA will remain  in effect (meaning this test can be used) for the duration of the COVID-19 declaration under Se                          ction 564(b)(1) of the Act, 21 U.S.C. section 360bbb-3(b)(1), unless the authorization is terminated or revoked sooner.  Performed at [facility], [place] [address], [place], [place] [address]   Appointment on 03/10/2020  Component Date Value Ref Range Status  . WBC 03/10/2020 10.8* 4.0 - 10.5 K/uL Final   WHITE COUNT CONFIRMED ON SMEAR  . RBC 03/10/2020 2.76* 4.22 - 5.81 MIL/uL Final  . Hemoglobin 03/10/2020 9.8* 13.0 - 17.0 g/dL Final  . HCT 03/10/2020 29.5* 39 - 52 % Final  . MCV 03/10/2020 106.9* 80.0 - 100.0 fL Final  . MCH 03/10/2020 35.5* 26.0 - 34.0 pg Final  . MCHC 03/10/2020 33.2  30.0 - 36.0 g/dL Final  . RDW 03/10/2020 16.1* 11.5 - 15.5 % Final  . Platelets 03/10/2020 136* 150 - 400 K/uL Final   SPECIMEN CHECKED FOR CLOTS  . nRBC 03/10/2020 0.0  0.0 - 0.2 % Final  . Neutrophils Relative % 03/10/2020 43  % Final  . Neutro Abs 03/10/2020 4.8  1.7 - 7.7 K/uL Final  . Lymphocytes Relative 03/10/2020 29  % Final  . Lymphs Abs 03/10/2020 3.1  0.7 - 4.0 K/uL Final  . Monocytes Relative 03/10/2020 25  % Final  . Monocytes Absolute 03/10/2020 2.7* 0.1 - 1.0 K/uL Final  . Eosinophils Relative 03/10/2020 1  % Final  . Eosinophils Absolute 03/10/2020 0.1  0.0 - 0.5 K/uL Final  . Basophils Relative 03/10/2020 1  % Final  . Basophils Absolute 03/10/2020 0.1  0.0 - 0.1 K/uL Final  . RBC Morphology 03/10/2020 MIXED RBC POPULATION   Final  . Smear Review 03/10/2020 Normal platelet morphology   Final   PLATELETS APPEAR DECREASED  . Immature Granulocytes 03/10/2020 1  % Final  . Abs Immature  Granulocytes 03/10/2020 0.09* 0.00 - 0.07 K/uL Final   Performed at [facility], [address] [place], [place] [address]      ECG: Date: 03/03/2020 Time ECG obtained: 0859 AM Rate: 67 bpm Rhythm:  Sinus rhythm with a nonspecific IVCD Intervals: QRS 128 ms. QTc 476 ms. ST segment and T wave changes: Abnormal lateral Q waves; inferior repolarization abnormality; old anterior infarct Comparison: Similar to previous tracing obtained on 02/26/2020   IMAGING / PROCEDURES: MRI BRAIN WO CONTRAST done on 03/03/2020 1. No acute intracranial process. 2. Sequela of cerebral amyloid angiopathy and prior left occipital hemorrhage. 3. Mild cerebral atrophy and moderate chronic microvascular ischemic changes.  CT ABDOMEN PELVIS W CONTRAST done on 03/03/2020 1. No new or acute  abdominopelvic findings. Interval cholecystectomy. 2. Trace amount of free fluid in the pelvis, nonspecific. 3. 2-3 mm stone within the distal left ureter is unchanged from prior study without associated hydroureteronephrosis or periureteral stranding. 4. Moderate volume of stool throughout the colon. 5. Aortic atherosclerosis  DIAGNOSTIC CHEST RADIOGRAPHS done on 03/03/2020 1. Increased bibasilar opacities which may reflect increased atelectasis or infection.  ECHOCARDIOGRAM done on 02/27/2020 1. Left ventricular ejection fraction, by estimation, is 35 to 40%. Left ventricular ejection fraction by PLAX is 40 %. The left ventricle has mild to moderately decreased function. The left ventricle demonstrates regional wall motion abnormalities. The left ventricular internal cavity size was mildly dilated. Left ventricular diastolic parameters are consistent with Grade I diastolic dysfunction (impaired relaxation). 2. Right ventricular systolic function is normal. The right ventricular size is normal.  3. Left atrial size was mildly dilated.  4. Right atrial size was mildly dilated.  5. The mitral valve is grossly  normal. Mild mitral valve regurgitation.  6. The aortic valve was not well visualized. Aortic valve regurgitation is mild. Mild aortic valve stenosis (mean gradient 12.0 mmHg; peak gradient 20.2 mmHg)  PULMONARY FUNCTION TESTING done on 10/06/2019  1. SPIROMETRY:  FVC was 4.23 liters, 94% of predicted  FEV1 was 3.09, 89% of predicted  FEV1 ratio was 73  FEF 25-75% liters per second was 69% of predicted 2. LUNG VOLUMES:  TLC was 82% of predicted  RV was 54% of predicted 3. DIFFUSION CAPACITY:  DLCO was 56% of predicted  DLCO/VA was 54% of predicted  *Corrected to 73% for Hemoglobin 8.4 on 08/17/19 4. FLOW VOLUME LOOP:  Scooping of expiratory limb suggestive of obstructive physiology  LEXISCAN done on 09/23/2019 1. LVEF 35% 2. Inferolateral perfusion abnormality of moderate intensity noted at rest and with stress 3. Regional wall motion demonstrates hypokinesis of the inferior wall 4. No artifacts noted 5. Enlarged left ventricular cavity 6. The overall quality of the study is fair  LEFT HEART CATHETERIZATION AND CORONARY/GRAFT ANGIOGRAPHY done on 12/28/2015 1. Prox RCA lesion, 100 %stenosed. 2. Dist Cx lesion, 50 %stenosed. 3. Mid LAD lesion, 100 %stenosed. 4. LM lesion, 50 %stenosed. 5. Prox LAD lesion, 90 %stenosed. 6. RIMA graft was visualized by angiography and is normal in caliber and anatomically normal. 7. The flow is not reversed. 8. There is no competitive flow 9. LIMA and is normal in caliber and anatomically normal. 10. The flow is not reversed. 11. There is no competitive flow 12. And is normal in caliber and anatomically normal. 13. The flow is not reversed. 14. There is no competitive flow 15. Dist Graft lesion, 75 %stenosed. 16. LIMA jump graft to RIMA and then distal LAD 17. 75% lesion and insertion into the RIMA   Impression and Plan:  ANSEN SAYEGH has been referred for pre-anesthesia review and clearance prior to him undergoing the planned  anesthetic and procedural courses. Available labs, pertinent testing, and imaging results were personally reviewed by me. This patient has been appropriately cleared by internal medicine Andres Heck, MD), cardiology Andres Glassing, MD), and neurology Andres Elders, MD).   Based on clinical review performed today (03/18/20), barring any significant acute changes in the patient's overall condition, it is anticipated that he will be able to proceed with the planned surgical intervention. Any acute changes in clinical condition may necessitate his procedure being postponed and/or cancelled. Pre-surgical instructions were reviewed with the patient during his PAT appointment and questions were fielded by PAT clinical staff.  Honor Loh, MSN, APRN, FNP-C,  [place]  Peri-operative Services Nurse Practitioner Phone: [phone] 03/18/20 8:03 AM  NOTE: This note has been prepared using Dragon dictation software. Despite my best ability to proofread, there is always the potential that unintentional transcriptional errors may still occur from this process.

## 2020-03-18 ENCOUNTER — Encounter
Admission: RE | Disposition: A | Discharge status: Home / Self Care | Payer: Self-pay | Source: Home / Self Care | Attending: Urology

## 2020-03-18 ENCOUNTER — Encounter: Payer: Self-pay | Admitting: Urology

## 2020-03-18 ENCOUNTER — Ambulatory Visit
Admission: RE | Admit: 2020-03-18 | Discharge: 2020-03-18 | Disposition: A | Discharge status: Home / Self Care | Payer: PPO | Attending: Urology | Admitting: Urology

## 2020-03-18 ENCOUNTER — Ambulatory Visit: Payer: PPO | Admitting: Urgent Care

## 2020-03-18 ENCOUNTER — Other Ambulatory Visit: Payer: Self-pay

## 2020-03-18 ENCOUNTER — Ambulatory Visit: Payer: PPO

## 2020-03-18 DIAGNOSIS — I255 Ischemic cardiomyopathy: Secondary | ICD-10-CM | POA: Insufficient documentation

## 2020-03-18 DIAGNOSIS — Z87891 Personal history of nicotine dependence: Secondary | ICD-10-CM | POA: Diagnosis not present

## 2020-03-18 DIAGNOSIS — I482 Chronic atrial fibrillation, unspecified: Secondary | ICD-10-CM | POA: Insufficient documentation

## 2020-03-18 DIAGNOSIS — Z8673 Personal history of transient ischemic attack (TIA), and cerebral infarction without residual deficits: Secondary | ICD-10-CM | POA: Diagnosis not present

## 2020-03-18 DIAGNOSIS — N183 Chronic kidney disease, stage 3 unspecified: Secondary | ICD-10-CM | POA: Diagnosis not present

## 2020-03-18 DIAGNOSIS — I4891 Unspecified atrial fibrillation: Secondary | ICD-10-CM | POA: Insufficient documentation

## 2020-03-18 DIAGNOSIS — I5022 Chronic systolic (congestive) heart failure: Secondary | ICD-10-CM | POA: Insufficient documentation

## 2020-03-18 DIAGNOSIS — F419 Anxiety disorder, unspecified: Secondary | ICD-10-CM | POA: Insufficient documentation

## 2020-03-18 DIAGNOSIS — Z87442 Personal history of urinary calculi: Secondary | ICD-10-CM | POA: Diagnosis not present

## 2020-03-18 DIAGNOSIS — I130 Hypertensive heart and chronic kidney disease with heart failure and stage 1 through stage 4 chronic kidney disease, or unspecified chronic kidney disease: Secondary | ICD-10-CM | POA: Diagnosis not present

## 2020-03-18 DIAGNOSIS — I509 Heart failure, unspecified: Secondary | ICD-10-CM | POA: Diagnosis not present

## 2020-03-18 DIAGNOSIS — Z79899 Other long term (current) drug therapy: Secondary | ICD-10-CM | POA: Insufficient documentation

## 2020-03-18 DIAGNOSIS — Z955 Presence of coronary angioplasty implant and graft: Secondary | ICD-10-CM | POA: Diagnosis not present

## 2020-03-18 DIAGNOSIS — Z951 Presence of aortocoronary bypass graft: Secondary | ICD-10-CM | POA: Diagnosis not present

## 2020-03-18 DIAGNOSIS — I2510 Atherosclerotic heart disease of native coronary artery without angina pectoris: Secondary | ICD-10-CM | POA: Insufficient documentation

## 2020-03-18 DIAGNOSIS — Z7982 Long term (current) use of aspirin: Secondary | ICD-10-CM | POA: Diagnosis not present

## 2020-03-18 DIAGNOSIS — D631 Anemia in chronic kidney disease: Secondary | ICD-10-CM | POA: Diagnosis not present

## 2020-03-18 DIAGNOSIS — I252 Old myocardial infarction: Secondary | ICD-10-CM | POA: Diagnosis not present

## 2020-03-18 DIAGNOSIS — K219 Gastro-esophageal reflux disease without esophagitis: Secondary | ICD-10-CM | POA: Diagnosis not present

## 2020-03-18 DIAGNOSIS — N201 Calculus of ureter: Secondary | ICD-10-CM

## 2020-03-18 DIAGNOSIS — N1831 Chronic kidney disease, stage 3a: Secondary | ICD-10-CM | POA: Diagnosis not present

## 2020-03-18 HISTORY — PX: CYSTOSCOPY/URETEROSCOPY/HOLMIUM LASER/STENT PLACEMENT: SHX6546

## 2020-03-18 SURGERY — CYSTOSCOPY/URETEROSCOPY/HOLMIUM LASER/STENT PLACEMENT
Anesthesia: General | Laterality: Left

## 2020-03-18 MED ORDER — EPHEDRINE SULFATE 50 MG/ML IJ SOLN
INTRAMUSCULAR | Status: DC | PRN
  Administered 2020-03-18 (×2): 5 mg via INTRAVENOUS

## 2020-03-18 MED ORDER — FENTANYL CITRATE (PF) 100 MCG/2ML IJ SOLN: 25.0000 ug | INTRAMUSCULAR | Status: DC | PRN

## 2020-03-18 MED ORDER — OXYCODONE HCL 5 MG PO TABS: 5.0000 mg | ORAL_TABLET | Freq: Once | ORAL | Status: DC | PRN

## 2020-03-18 MED ORDER — VASOPRESSIN 20 UNIT/ML IV SOLN
INTRAVENOUS | Status: DC | PRN
  Administered 2020-03-18: 2 [IU] via INTRAVENOUS

## 2020-03-18 MED ORDER — SUGAMMADEX SODIUM 200 MG/2ML IV SOLN
INTRAVENOUS | Status: DC | PRN
  Administered 2020-03-18: 200 mg via INTRAVENOUS

## 2020-03-18 MED ORDER — OXYCODONE HCL 5 MG/5ML PO SOLN: 5.0000 mg | Freq: Once | ORAL | Status: DC | PRN

## 2020-03-18 MED ORDER — CEFAZOLIN SODIUM-DEXTROSE 2-4 GM/100ML-% IV SOLN
INTRAVENOUS | Status: AC
  Filled 2020-03-18: qty 100

## 2020-03-18 MED ORDER — NITROFURANTOIN MACROCRYSTAL 50 MG PO CAPS: 50.0000 mg | ORAL_CAPSULE | Freq: Every day | ORAL | 0 refills | Status: AC

## 2020-03-18 MED ORDER — ROCURONIUM BROMIDE 100 MG/10ML IV SOLN
INTRAVENOUS | Status: DC | PRN
  Administered 2020-03-18: 50 mg via INTRAVENOUS

## 2020-03-18 MED ORDER — FENTANYL CITRATE (PF) 100 MCG/2ML IJ SOLN
INTRAMUSCULAR | Status: AC
  Filled 2020-03-18: qty 2

## 2020-03-18 MED ORDER — DEXAMETHASONE SODIUM PHOSPHATE 10 MG/ML IJ SOLN
INTRAMUSCULAR | Status: DC | PRN
  Administered 2020-03-18: 10 mg via INTRAVENOUS

## 2020-03-18 MED ORDER — GLYCOPYRROLATE 0.2 MG/ML IJ SOLN
INTRAMUSCULAR | Status: DC | PRN
  Administered 2020-03-18: .2 mg via INTRAVENOUS

## 2020-03-18 MED ORDER — PROPOFOL 10 MG/ML IV BOLUS
INTRAVENOUS | Status: DC | PRN
  Administered 2020-03-18: 120 mg via INTRAVENOUS

## 2020-03-18 MED ORDER — PHENYLEPHRINE HCL (PRESSORS) 10 MG/ML IV SOLN
INTRAVENOUS | Status: DC | PRN
  Administered 2020-03-18: 200 ug via INTRAVENOUS
  Administered 2020-03-18 (×2): 100 ug via INTRAVENOUS

## 2020-03-18 MED ORDER — FENTANYL CITRATE (PF) 100 MCG/2ML IJ SOLN
INTRAMUSCULAR | Status: DC | PRN
  Administered 2020-03-18: 50 ug via INTRAVENOUS

## 2020-03-18 MED ORDER — LIDOCAINE HCL (CARDIAC) PF 100 MG/5ML IV SOSY
PREFILLED_SYRINGE | INTRAVENOUS | Status: DC | PRN
  Administered 2020-03-18: 100 mg via INTRAVENOUS

## 2020-03-18 MED ORDER — IOHEXOL 180 MG/ML  SOLN
INTRAMUSCULAR | Status: DC | PRN
  Administered 2020-03-18: 5 mL

## 2020-03-18 MED ORDER — ONDANSETRON HCL 4 MG/2ML IJ SOLN
INTRAMUSCULAR | Status: DC | PRN
  Administered 2020-03-18: 4 mg via INTRAVENOUS

## 2020-03-18 MED ORDER — HYDROCODONE-ACETAMINOPHEN 5-325 MG PO TABS: 1.0000 | ORAL_TABLET | ORAL | 0 refills | Status: AC | PRN

## 2020-03-18 MED ORDER — ACETAMINOPHEN 10 MG/ML IV SOLN
INTRAVENOUS | Status: DC | PRN
  Administered 2020-03-18: 1000 mg via INTRAVENOUS

## 2020-03-18 SURGICAL SUPPLY — 37 items
ADH LQ OCL WTPRF AMP STRL LF (MISCELLANEOUS) ×1
ADHESIVE MASTISOL STRL (MISCELLANEOUS) ×3 IMPLANT
BAG DRAIN CYSTO-URO LG1000N (MISCELLANEOUS) ×3 IMPLANT
BRUSH SCRUB EZ 1% IODOPHOR (MISCELLANEOUS) ×3 IMPLANT
CATH URETL 5X70 OPEN END (CATHETERS) IMPLANT
CNTNR SPEC 2.5X3XGRAD LEK (MISCELLANEOUS)
CONT SPEC 4OZ STER OR WHT (MISCELLANEOUS)
CONT SPEC 4OZ STRL OR WHT (MISCELLANEOUS)
CONTAINER SPEC 2.5X3XGRAD LEK (MISCELLANEOUS) IMPLANT
DRAPE UTILITY 15X26 TOWEL STRL (DRAPES) ×3 IMPLANT
DRSG TEGADERM 2-3/8X2-3/4 SM (GAUZE/BANDAGES/DRESSINGS) ×3 IMPLANT
FIBER LASER TRAC TIP (UROLOGICAL SUPPLIES) ×3 IMPLANT
GLOVE BIO SURGEON STRL SZ 6.5 (GLOVE) ×2 IMPLANT
GLOVE BIO SURGEONS STRL SZ 6.5 (GLOVE) ×1
GLOVE BIOGEL PI IND STRL 7.5 (GLOVE) ×1 IMPLANT
GLOVE BIOGEL PI INDICATOR 7.5 (GLOVE) ×2
GOWN STRL REUS W/ TWL LRG LVL3 (GOWN DISPOSABLE) ×1 IMPLANT
GOWN STRL REUS W/ TWL XL LVL3 (GOWN DISPOSABLE) ×1 IMPLANT
GOWN STRL REUS W/TWL LRG LVL3 (GOWN DISPOSABLE) ×3
GOWN STRL REUS W/TWL XL LVL3 (GOWN DISPOSABLE) ×3
GUIDEWIRE STR DUAL SENSOR (WIRE) ×3 IMPLANT
INFUSOR MANOMETER BAG 3000ML (MISCELLANEOUS) ×3 IMPLANT
INTRODUCER DILATOR DOUBLE (INTRODUCER) IMPLANT
KIT TURNOVER CYSTO (KITS) ×3 IMPLANT
MANIFOLD NEPTUNE II (INSTRUMENTS) IMPLANT
PACK CYSTO AR (MISCELLANEOUS) ×3 IMPLANT
SET CYSTO W/LG BORE CLAMP LF (SET/KITS/TRAYS/PACK) ×3 IMPLANT
SHEATH URETERAL 12FRX35CM (MISCELLANEOUS) IMPLANT
SOL .9 NS 3000ML IRR  AL (IV SOLUTION) ×2
SOL .9 NS 3000ML IRR AL (IV SOLUTION) ×1
SOL .9 NS 3000ML IRR UROMATIC (IV SOLUTION) ×1 IMPLANT
STENT URET 6FRX24 CONTOUR (STENTS) IMPLANT
STENT URET 6FRX26 CONTOUR (STENTS) IMPLANT
SURGILUBE 2OZ TUBE FLIPTOP (MISCELLANEOUS) ×3 IMPLANT
SYR 10ML LL (SYRINGE) ×3 IMPLANT
VALVE UROSEAL ADJ ENDO (VALVE) IMPLANT
WATER STERILE IRR 1000ML POUR (IV SOLUTION) ×3 IMPLANT

## 2020-03-18 NOTE — Interval H&P Note (Signed)
UROLOGY H&P UPDATE  Agree with prior H&P dated 03/08/20, 4mm left distal ureteral stone for ~1 year with intermittent pain and baseline CKD.  Cardiac: RRR Lungs: CTA bilaterally  Laterality: Left Procedure: Left ureteroscopy, laser lithotripsy, stent placement  Urine: urinalysis 10/15 benign  We specifically discussed the risks ureteroscopy including bleeding, infection/sepsis, stent related symptoms including flank pain/urgency/frequency/incontinence/dysuria, ureteral injury, inability to access stone, or need for staged or additional procedures.   Billey Co, MD 03/18/2020

## 2020-03-18 NOTE — Discharge Instructions (Signed)

## 2020-03-18 NOTE — Anesthesia Procedure Notes (Signed)
Procedure Name: Intubation Performed by: Fletcher-Harrison, Yuki Purves, CRNA Pre-anesthesia Checklist: Patient identified, Emergency Drugs available, Suction available and Patient being monitored Patient Re-evaluated:Patient Re-evaluated prior to induction Oxygen Delivery Method: Circle system utilized Preoxygenation: Pre-oxygenation with 100% oxygen Induction Type: IV induction Ventilation: Mask ventilation without difficulty Laryngoscope Size: McGraph and 3 Grade View: Grade I Tube type: Oral Number of attempts: 1 Airway Equipment and Method: Stylet and Oral airway Placement Confirmation: ETT inserted through vocal cords under direct vision,  positive ETCO2,  breath sounds checked- equal and bilateral and CO2 detector Secured at: 21 cm Tube secured with: Tape Dental Injury: Teeth and Oropharynx as per pre-operative assessment        

## 2020-03-18 NOTE — Anesthesia Preprocedure Evaluation (Signed)
Anesthesia Evaluation  Patient identified by MRN, date of birth, ID band Patient awake    Reviewed: Allergy & Precautions, NPO status , Patient's Chart, lab work & pertinent test results  History of Anesthesia Complications Negative for: history of anesthetic complications  Airway Mallampati: II       Dental  (+) Poor Dentition, Chipped, Missing   Pulmonary neg sleep apnea, neg COPD, Not current smoker, former smoker,           Cardiovascular hypertension, Pt. on medications (-) angina+ CAD, + Past MI, + Cardiac Stents, + CABG and +CHF  (-) pacemaker(-) Valvular Problems/Murmurs     Neuro/Psych neg Seizures Anxiety    GI/Hepatic Neg liver ROS, PUD, GERD  Medicated and Controlled,  Endo/Other  neg diabetes  Renal/GU Renal InsufficiencyRenal disease     Musculoskeletal  (+) Arthritis ,   Abdominal   Peds  Hematology  (+) Blood dyscrasia, anemia ,   Anesthesia Other Findings   Reproductive/Obstetrics                             Anesthesia Physical  Anesthesia Plan  ASA: III  Anesthesia Plan: General ETT   Post-op Pain Management:    Induction: Intravenous  PONV Risk Score and Plan: 2 and Ondansetron and Dexamethasone  Airway Management Planned: Oral ETT  Additional Equipment:   Intra-op Plan:   Post-operative Plan: Extubation in OR  Informed Consent: I have reviewed the patients History and Physical, chart, labs and discussed the procedure including the risks, benefits and alternatives for the proposed anesthesia with the patient or authorized representative who has indicated his/her understanding and acceptance.     Dental Advisory Given  Plan Discussed with: Anesthesiologist, CRNA and Surgeon  Anesthesia Plan Comments: (Patient consented for risks of anesthesia including but not limited to:  - adverse reactions to medications - damage to eyes, teeth, lips or other  oral mucosa - nerve damage due to positioning  - sore throat or hoarseness - Damage to heart, brain, nerves, lungs, other parts of body or loss of life  Patient voiced understanding.)        Anesthesia Quick Evaluation

## 2020-03-18 NOTE — Transfer of Care (Signed)
Immediate Anesthesia Transfer of Care Note  Patient: JOHANN SANTONE  Procedure(s) Performed: CYSTOSCOPY/URETEROSCOPY/HOLMIUM LASER/STENT PLACEMENT (Left )  Patient Location: PACU  Anesthesia Type:General  Level of Consciousness: awake, drowsy and patient cooperative  Airway & Oxygen Therapy: Patient Spontanous Breathing and Patient connected to face mask oxygen  Post-op Assessment: Report given to RN and Post -op Vital signs reviewed and stable  Post vital signs: Reviewed and stable  Last Vitals:  Vitals Value Taken Time  BP 121/65 03/18/20 1415  Temp 36.3 C 03/18/20 1415  Pulse 70 03/18/20 1419  Resp 19 03/18/20 1419  SpO2 97 % 03/18/20 1419  Vitals shown include unvalidated device data.  Last Pain:  Vitals:   03/18/20 1415  TempSrc:   PainSc: 0-No pain         Complications: No complications documented.

## 2020-03-18 NOTE — Op Note (Signed)
Date of procedure: 03/18/20  Preoperative diagnosis:  1. Left distal ureteral stone  Postoperative diagnosis:  1. Same  Procedure: 1. Cystoscopy, left ureteroscopy, laser lithotripsy, left retrograde pyelogram with intraoperative interpretation, left ureteral stent placement  Surgeon: Nickolas Madrid, MD  Anesthesia: General  Complications: None  Intraoperative findings:  1.  Normal cystoscopy, 3 mm left distal ureteral stone fragmented to dust, uncomplicated stent placement on string  EBL: Minimal  Specimens: None  Drains: Left 6 French by 28 cm ureteral stent on Dangler  Indication: TIMOTY BOURKE is a 86 y.o. patient with history of intermittent abdominal pain and CT showing a 3 mm left distal ureteral stone.  After reviewing the management options for treatment, they elected to proceed with the above surgical procedure(s). We have discussed the potential benefits and risks of the procedure, side effects of the proposed treatment, the likelihood of the patient achieving the goals of the procedure, and any potential problems that might occur during the procedure or recuperation. Informed consent has been obtained.  Description of procedure:  The patient was taken to the operating room and general anesthesia was induced. SCDs were placed for DVT prophylaxis. The patient was placed in the dorsal lithotomy position, prepped and draped in the usual sterile fashion, and preoperative antibiotics(Ancef) were administered. A preoperative time-out was performed.   A 21 French rigid cystoscope was used to intubate the urethra and a normal-appearing urethra was followed proximally into the bladder.  The prostate was moderate in size.  Thorough cystoscopy was performed and the bladder was grossly normal aside from some mild trabeculations.  A sensor wire was advanced into the left ureteral orifice advanced easily up into the kidney.  Using an additional sensor wire, I was ultimately able to advance  the semirigid ureteroscope alongside the wire into the ureter.  Identified a yellow and black 3 mm distal ureteral stone.  The 200 m laser fiber on settings of 1.0 J and 10 Hz was used to fragment the stone to dust, and these pieces were irrigated free from the ureter.  There was a subtle narrowing proximally to the stone in the mid ureter, but no definite stricture.  Contrast was injected from the distal ureter and showed no filling defects or extravasation.  The rigid cystoscope was backloaded over the wire and a 6 [place] by 28 cm ureteral stent was uneventfully placed with an excellent curl in the upper pole, as well as in the bladder.  The bladder was drained, and the Dangler was secured to the penis using Tegaderm.  Disposition: Stable to PACU  Plan: Remove stent at home on Tuesday morning Nitrofurantoin prophylaxis while stent in place  Nickolas Madrid, MD

## 2020-03-19 NOTE — Anesthesia Postprocedure Evaluation (Signed)
Anesthesia Post Note  Patient: Andres Gutierrez  Procedure(s) Performed: CYSTOSCOPY/URETEROSCOPY/HOLMIUM LASER/STENT PLACEMENT (Left )  Patient location during evaluation: PACU Anesthesia Type: General Level of consciousness: awake and alert Pain management: pain level controlled Vital Signs Assessment: post-procedure vital signs reviewed and stable Respiratory status: spontaneous breathing, nonlabored ventilation, respiratory function stable and patient connected to nasal cannula oxygen Cardiovascular status: blood pressure returned to baseline and stable Postop Assessment: no apparent nausea or vomiting Anesthetic complications: no   No complications documented.   Last Vitals:  Vitals:   03/18/20 1500 03/18/20 1518  BP: (!) 126/56 (!) 131/53  Pulse: 69 75  Resp: 16 18  Temp: 36.4 C (!) 36.1 C  SpO2: 96% 95%    Last Pain:  Vitals:   03/18/20 1518  TempSrc: Temporal  PainSc: 0-No pain                 Precious Haws Charlyn Vialpando

## 2020-03-21 ENCOUNTER — Encounter: Payer: Self-pay | Admitting: Urology

## 2020-03-22 ENCOUNTER — Ambulatory Visit: Payer: PPO | Admitting: Urology

## 2020-03-24 ENCOUNTER — Ambulatory Visit: Payer: PPO | Admitting: Urology

## 2020-03-28 DIAGNOSIS — G609 Hereditary and idiopathic neuropathy, unspecified: Secondary | ICD-10-CM | POA: Diagnosis not present

## 2020-03-28 DIAGNOSIS — L97522 Non-pressure chronic ulcer of other part of left foot with fat layer exposed: Secondary | ICD-10-CM | POA: Diagnosis not present

## 2020-03-28 DIAGNOSIS — L97511 Non-pressure chronic ulcer of other part of right foot limited to breakdown of skin: Secondary | ICD-10-CM | POA: Diagnosis not present

## 2020-03-29 ENCOUNTER — Other Ambulatory Visit: Payer: PPO | Admitting: Urology

## 2020-03-30 ENCOUNTER — Ambulatory Visit: Payer: PPO | Admitting: Urology

## 2020-03-31 ENCOUNTER — Inpatient Hospital Stay: Payer: PPO | Attending: Internal Medicine

## 2020-03-31 ENCOUNTER — Other Ambulatory Visit: Payer: Self-pay

## 2020-03-31 ENCOUNTER — Inpatient Hospital Stay: Payer: PPO

## 2020-03-31 VITALS — BP 109/56 | HR 95

## 2020-03-31 DIAGNOSIS — D500 Iron deficiency anemia secondary to blood loss (chronic): Secondary | ICD-10-CM

## 2020-03-31 DIAGNOSIS — N189 Chronic kidney disease, unspecified: Secondary | ICD-10-CM | POA: Insufficient documentation

## 2020-03-31 DIAGNOSIS — I129 Hypertensive chronic kidney disease with stage 1 through stage 4 chronic kidney disease, or unspecified chronic kidney disease: Secondary | ICD-10-CM | POA: Diagnosis not present

## 2020-03-31 DIAGNOSIS — D631 Anemia in chronic kidney disease: Secondary | ICD-10-CM | POA: Insufficient documentation

## 2020-03-31 DIAGNOSIS — N183 Chronic kidney disease, stage 3 unspecified: Secondary | ICD-10-CM

## 2020-03-31 DIAGNOSIS — Z79899 Other long term (current) drug therapy: Secondary | ICD-10-CM | POA: Diagnosis not present

## 2020-03-31 LAB — CBC WITH DIFFERENTIAL/PLATELET
Abs Immature Granulocytes: 0.17 10*3/uL — ABNORMAL HIGH (ref 0.00–0.07)
Basophils Absolute: 0 10*3/uL (ref 0.0–0.1)
Basophils Relative: 0 %
Eosinophils Absolute: 0.1 10*3/uL (ref 0.0–0.5)
Eosinophils Relative: 1 %
HCT: 28.5 % — ABNORMAL LOW (ref 39.0–52.0)
Hemoglobin: 9.2 g/dL — ABNORMAL LOW (ref 13.0–17.0)
Immature Granulocytes: 1 %
Lymphocytes Relative: 17 %
Lymphs Abs: 2.1 10*3/uL (ref 0.7–4.0)
MCH: 34.5 pg — ABNORMAL HIGH (ref 26.0–34.0)
MCHC: 32.3 g/dL (ref 30.0–36.0)
MCV: 106.7 fL — ABNORMAL HIGH (ref 80.0–100.0)
Monocytes Absolute: 4.2 10*3/uL — ABNORMAL HIGH (ref 0.1–1.0)
Monocytes Relative: 33 %
Neutro Abs: 6.3 10*3/uL (ref 1.7–7.7)
Neutrophils Relative %: 48 %
Platelets: 129 10*3/uL — ABNORMAL LOW (ref 150–400)
RBC: 2.67 MIL/uL — ABNORMAL LOW (ref 4.22–5.81)
RDW: 15.8 % — ABNORMAL HIGH (ref 11.5–15.5)
Smear Review: DECREASED
WBC: 12.9 10*3/uL — ABNORMAL HIGH (ref 4.0–10.5)
nRBC: 0 % (ref 0.0–0.2)

## 2020-03-31 MED ORDER — DARBEPOETIN ALFA 300 MCG/0.6ML IJ SOSY
300.0000 ug | PREFILLED_SYRINGE | Freq: Once | INTRAMUSCULAR | Status: AC
  Administered 2020-03-31: 300 ug via SUBCUTANEOUS
  Filled 2020-03-31: qty 0.6

## 2020-04-11 DIAGNOSIS — L97521 Non-pressure chronic ulcer of other part of left foot limited to breakdown of skin: Secondary | ICD-10-CM | POA: Diagnosis not present

## 2020-04-11 DIAGNOSIS — G609 Hereditary and idiopathic neuropathy, unspecified: Secondary | ICD-10-CM | POA: Diagnosis not present

## 2020-04-11 DIAGNOSIS — L97511 Non-pressure chronic ulcer of other part of right foot limited to breakdown of skin: Secondary | ICD-10-CM | POA: Diagnosis not present

## 2020-04-21 ENCOUNTER — Inpatient Hospital Stay: Payer: PPO | Attending: Internal Medicine

## 2020-04-21 ENCOUNTER — Other Ambulatory Visit: Payer: Self-pay

## 2020-04-21 ENCOUNTER — Inpatient Hospital Stay: Payer: PPO

## 2020-04-21 VITALS — BP 131/66 | HR 77

## 2020-04-21 DIAGNOSIS — Z79899 Other long term (current) drug therapy: Secondary | ICD-10-CM | POA: Diagnosis not present

## 2020-04-21 DIAGNOSIS — D631 Anemia in chronic kidney disease: Secondary | ICD-10-CM | POA: Insufficient documentation

## 2020-04-21 DIAGNOSIS — N183 Chronic kidney disease, stage 3 unspecified: Secondary | ICD-10-CM | POA: Diagnosis not present

## 2020-04-21 DIAGNOSIS — D500 Iron deficiency anemia secondary to blood loss (chronic): Secondary | ICD-10-CM

## 2020-04-21 DIAGNOSIS — I129 Hypertensive chronic kidney disease with stage 1 through stage 4 chronic kidney disease, or unspecified chronic kidney disease: Secondary | ICD-10-CM | POA: Insufficient documentation

## 2020-04-21 LAB — CBC WITH DIFFERENTIAL/PLATELET
Abs Immature Granulocytes: 0.08 10*3/uL — ABNORMAL HIGH (ref 0.00–0.07)
Basophils Absolute: 0 10*3/uL (ref 0.0–0.1)
Basophils Relative: 0 %
Eosinophils Absolute: 0.1 10*3/uL (ref 0.0–0.5)
Eosinophils Relative: 1 %
HCT: 29 % — ABNORMAL LOW (ref 39.0–52.0)
Hemoglobin: 9.4 g/dL — ABNORMAL LOW (ref 13.0–17.0)
Immature Granulocytes: 1 %
Lymphocytes Relative: 29 %
Lymphs Abs: 2.4 10*3/uL (ref 0.7–4.0)
MCH: 34.4 pg — ABNORMAL HIGH (ref 26.0–34.0)
MCHC: 32.4 g/dL (ref 30.0–36.0)
MCV: 106.2 fL — ABNORMAL HIGH (ref 80.0–100.0)
Monocytes Absolute: 2.7 10*3/uL — ABNORMAL HIGH (ref 0.1–1.0)
Monocytes Relative: 32 %
Neutro Abs: 3.1 10*3/uL (ref 1.7–7.7)
Neutrophils Relative %: 37 %
Platelets: 132 10*3/uL — ABNORMAL LOW (ref 150–400)
RBC: 2.73 MIL/uL — ABNORMAL LOW (ref 4.22–5.81)
RDW: 16.5 % — ABNORMAL HIGH (ref 11.5–15.5)
Smear Review: DECREASED
WBC: 8.4 10*3/uL (ref 4.0–10.5)
nRBC: 0 % (ref 0.0–0.2)

## 2020-04-21 MED ORDER — DARBEPOETIN ALFA 300 MCG/0.6ML IJ SOSY
300.0000 ug | PREFILLED_SYRINGE | Freq: Once | INTRAMUSCULAR | Status: AC
  Administered 2020-04-21: 300 ug via SUBCUTANEOUS
  Filled 2020-04-21: qty 0.6

## 2020-04-28 DIAGNOSIS — D500 Iron deficiency anemia secondary to blood loss (chronic): Secondary | ICD-10-CM | POA: Diagnosis not present

## 2020-04-28 DIAGNOSIS — E869 Volume depletion, unspecified: Secondary | ICD-10-CM | POA: Diagnosis not present

## 2020-04-28 DIAGNOSIS — D696 Thrombocytopenia, unspecified: Secondary | ICD-10-CM | POA: Diagnosis not present

## 2020-04-28 DIAGNOSIS — I5022 Chronic systolic (congestive) heart failure: Secondary | ICD-10-CM | POA: Diagnosis not present

## 2020-04-28 DIAGNOSIS — E538 Deficiency of other specified B group vitamins: Secondary | ICD-10-CM | POA: Diagnosis not present

## 2020-04-28 DIAGNOSIS — N1831 Chronic kidney disease, stage 3a: Secondary | ICD-10-CM | POA: Diagnosis not present

## 2020-04-28 DIAGNOSIS — R55 Syncope and collapse: Secondary | ICD-10-CM | POA: Diagnosis not present

## 2020-05-02 DIAGNOSIS — G609 Hereditary and idiopathic neuropathy, unspecified: Secondary | ICD-10-CM | POA: Diagnosis not present

## 2020-05-02 DIAGNOSIS — L97511 Non-pressure chronic ulcer of other part of right foot limited to breakdown of skin: Secondary | ICD-10-CM | POA: Diagnosis not present

## 2020-05-02 DIAGNOSIS — L97521 Non-pressure chronic ulcer of other part of left foot limited to breakdown of skin: Secondary | ICD-10-CM | POA: Diagnosis not present

## 2020-05-13 DIAGNOSIS — Z79899 Other long term (current) drug therapy: Secondary | ICD-10-CM | POA: Diagnosis not present

## 2020-05-13 DIAGNOSIS — K2100 Gastro-esophageal reflux disease with esophagitis, without bleeding: Secondary | ICD-10-CM | POA: Diagnosis not present

## 2020-05-13 DIAGNOSIS — Z7982 Long term (current) use of aspirin: Secondary | ICD-10-CM | POA: Diagnosis not present

## 2020-05-13 DIAGNOSIS — Z9049 Acquired absence of other specified parts of digestive tract: Secondary | ICD-10-CM | POA: Diagnosis not present

## 2020-05-13 DIAGNOSIS — I509 Heart failure, unspecified: Secondary | ICD-10-CM | POA: Diagnosis not present

## 2020-05-13 DIAGNOSIS — I5020 Unspecified systolic (congestive) heart failure: Secondary | ICD-10-CM | POA: Diagnosis not present

## 2020-05-13 DIAGNOSIS — I25810 Atherosclerosis of coronary artery bypass graft(s) without angina pectoris: Secondary | ICD-10-CM | POA: Diagnosis not present

## 2020-05-13 DIAGNOSIS — K219 Gastro-esophageal reflux disease without esophagitis: Secondary | ICD-10-CM | POA: Diagnosis not present

## 2020-05-13 DIAGNOSIS — N183 Chronic kidney disease, stage 3 unspecified: Secondary | ICD-10-CM | POA: Diagnosis not present

## 2020-05-13 DIAGNOSIS — D696 Thrombocytopenia, unspecified: Secondary | ICD-10-CM | POA: Diagnosis not present

## 2020-05-13 DIAGNOSIS — R531 Weakness: Secondary | ICD-10-CM | POA: Diagnosis not present

## 2020-05-13 DIAGNOSIS — R918 Other nonspecific abnormal finding of lung field: Secondary | ICD-10-CM | POA: Diagnosis not present

## 2020-05-13 DIAGNOSIS — Z881 Allergy status to other antibiotic agents status: Secondary | ICD-10-CM | POA: Diagnosis not present

## 2020-05-13 DIAGNOSIS — I4891 Unspecified atrial fibrillation: Secondary | ICD-10-CM | POA: Diagnosis not present

## 2020-05-13 DIAGNOSIS — K2970 Gastritis, unspecified, without bleeding: Secondary | ICD-10-CM | POA: Diagnosis not present

## 2020-05-13 DIAGNOSIS — I5022 Chronic systolic (congestive) heart failure: Secondary | ICD-10-CM | POA: Diagnosis not present

## 2020-05-13 DIAGNOSIS — D631 Anemia in chronic kidney disease: Secondary | ICD-10-CM | POA: Diagnosis not present

## 2020-05-13 DIAGNOSIS — Z951 Presence of aortocoronary bypass graft: Secondary | ICD-10-CM | POA: Diagnosis not present

## 2020-05-13 DIAGNOSIS — Z888 Allergy status to other drugs, medicaments and biological substances status: Secondary | ICD-10-CM | POA: Diagnosis not present

## 2020-05-13 DIAGNOSIS — I130 Hypertensive heart and chronic kidney disease with heart failure and stage 1 through stage 4 chronic kidney disease, or unspecified chronic kidney disease: Secondary | ICD-10-CM | POA: Diagnosis not present

## 2020-05-13 DIAGNOSIS — U071 COVID-19: Secondary | ICD-10-CM | POA: Diagnosis not present

## 2020-05-13 DIAGNOSIS — N400 Enlarged prostate without lower urinary tract symptoms: Secondary | ICD-10-CM | POA: Diagnosis not present

## 2020-05-13 DIAGNOSIS — E611 Iron deficiency: Secondary | ICD-10-CM | POA: Diagnosis not present

## 2020-05-13 DIAGNOSIS — I2510 Atherosclerotic heart disease of native coronary artery without angina pectoris: Secondary | ICD-10-CM | POA: Diagnosis not present

## 2020-05-13 DIAGNOSIS — I252 Old myocardial infarction: Secondary | ICD-10-CM | POA: Diagnosis not present

## 2020-05-13 DIAGNOSIS — T39395A Adverse effect of other nonsteroidal anti-inflammatory drugs [NSAID], initial encounter: Secondary | ICD-10-CM | POA: Diagnosis not present

## 2020-05-13 DIAGNOSIS — K922 Gastrointestinal hemorrhage, unspecified: Secondary | ICD-10-CM | POA: Diagnosis not present

## 2020-05-13 DIAGNOSIS — R519 Headache, unspecified: Secondary | ICD-10-CM | POA: Diagnosis not present

## 2020-05-13 DIAGNOSIS — E785 Hyperlipidemia, unspecified: Secondary | ICD-10-CM | POA: Diagnosis not present

## 2020-05-13 DIAGNOSIS — K921 Melena: Secondary | ICD-10-CM | POA: Diagnosis not present

## 2020-05-13 DIAGNOSIS — R059 Cough, unspecified: Secondary | ICD-10-CM | POA: Diagnosis not present

## 2020-05-13 DIAGNOSIS — D649 Anemia, unspecified: Secondary | ICD-10-CM | POA: Diagnosis not present

## 2020-05-13 DIAGNOSIS — Z87891 Personal history of nicotine dependence: Secondary | ICD-10-CM | POA: Diagnosis not present

## 2020-05-13 DIAGNOSIS — E860 Dehydration: Secondary | ICD-10-CM | POA: Diagnosis not present

## 2020-05-14 DIAGNOSIS — D696 Thrombocytopenia, unspecified: Secondary | ICD-10-CM | POA: Diagnosis not present

## 2020-05-14 DIAGNOSIS — D649 Anemia, unspecified: Secondary | ICD-10-CM | POA: Diagnosis not present

## 2020-05-14 DIAGNOSIS — I25810 Atherosclerosis of coronary artery bypass graft(s) without angina pectoris: Secondary | ICD-10-CM | POA: Diagnosis not present

## 2020-05-14 DIAGNOSIS — U071 COVID-19: Secondary | ICD-10-CM | POA: Diagnosis not present

## 2020-05-14 DIAGNOSIS — I5020 Unspecified systolic (congestive) heart failure: Secondary | ICD-10-CM | POA: Diagnosis not present

## 2020-05-19 ENCOUNTER — Ambulatory Visit: Payer: PPO

## 2020-05-19 ENCOUNTER — Ambulatory Visit: Payer: PPO | Admitting: Nurse Practitioner

## 2020-05-19 ENCOUNTER — Other Ambulatory Visit: Payer: PPO

## 2020-05-19 DIAGNOSIS — K922 Gastrointestinal hemorrhage, unspecified: Secondary | ICD-10-CM | POA: Diagnosis not present

## 2020-05-19 DIAGNOSIS — U071 COVID-19: Secondary | ICD-10-CM | POA: Diagnosis not present

## 2020-05-19 DIAGNOSIS — J1282 Pneumonia due to coronavirus disease 2019: Secondary | ICD-10-CM | POA: Diagnosis not present

## 2020-05-19 DIAGNOSIS — Z23 Encounter for immunization: Secondary | ICD-10-CM | POA: Diagnosis not present

## 2020-05-19 DIAGNOSIS — D500 Iron deficiency anemia secondary to blood loss (chronic): Secondary | ICD-10-CM | POA: Diagnosis not present

## 2020-05-19 DIAGNOSIS — Z8616 Personal history of COVID-19: Secondary | ICD-10-CM | POA: Insufficient documentation

## 2020-05-23 DIAGNOSIS — L97521 Non-pressure chronic ulcer of other part of left foot limited to breakdown of skin: Secondary | ICD-10-CM | POA: Diagnosis not present

## 2020-05-23 DIAGNOSIS — G609 Hereditary and idiopathic neuropathy, unspecified: Secondary | ICD-10-CM | POA: Diagnosis not present

## 2020-05-23 DIAGNOSIS — L97511 Non-pressure chronic ulcer of other part of right foot limited to breakdown of skin: Secondary | ICD-10-CM | POA: Diagnosis not present

## 2020-05-27 ENCOUNTER — Inpatient Hospital Stay: Payer: PPO

## 2020-05-27 ENCOUNTER — Inpatient Hospital Stay (HOSPITAL_BASED_OUTPATIENT_CLINIC_OR_DEPARTMENT_OTHER): Payer: PPO | Admitting: Nurse Practitioner

## 2020-05-27 ENCOUNTER — Inpatient Hospital Stay: Payer: PPO | Attending: Nurse Practitioner

## 2020-05-27 VITALS — BP 122/59 | HR 81 | Temp 98.3°F | Resp 16 | Wt 186.0 lb

## 2020-05-27 DIAGNOSIS — D631 Anemia in chronic kidney disease: Secondary | ICD-10-CM

## 2020-05-27 DIAGNOSIS — D500 Iron deficiency anemia secondary to blood loss (chronic): Secondary | ICD-10-CM | POA: Diagnosis not present

## 2020-05-27 DIAGNOSIS — Z79899 Other long term (current) drug therapy: Secondary | ICD-10-CM | POA: Diagnosis not present

## 2020-05-27 DIAGNOSIS — I129 Hypertensive chronic kidney disease with stage 1 through stage 4 chronic kidney disease, or unspecified chronic kidney disease: Secondary | ICD-10-CM | POA: Insufficient documentation

## 2020-05-27 DIAGNOSIS — N183 Chronic kidney disease, stage 3 unspecified: Secondary | ICD-10-CM | POA: Diagnosis not present

## 2020-05-27 LAB — CBC WITH DIFFERENTIAL/PLATELET
Abs Immature Granulocytes: 0.18 10*3/uL — ABNORMAL HIGH (ref 0.00–0.07)
Basophils Absolute: 0 10*3/uL (ref 0.0–0.1)
Basophils Relative: 0 %
Eosinophils Absolute: 0.1 10*3/uL (ref 0.0–0.5)
Eosinophils Relative: 1 %
HCT: 26.7 % — ABNORMAL LOW (ref 39.0–52.0)
Hemoglobin: 8.8 g/dL — ABNORMAL LOW (ref 13.0–17.0)
Immature Granulocytes: 2 %
Lymphocytes Relative: 20 %
Lymphs Abs: 2.3 10*3/uL (ref 0.7–4.0)
MCH: 33.6 pg (ref 26.0–34.0)
MCHC: 33 g/dL (ref 30.0–36.0)
MCV: 101.9 fL — ABNORMAL HIGH (ref 80.0–100.0)
Monocytes Absolute: 4.1 10*3/uL — ABNORMAL HIGH (ref 0.1–1.0)
Monocytes Relative: 36 %
Neutro Abs: 4.7 10*3/uL (ref 1.7–7.7)
Neutrophils Relative %: 41 %
Platelets: 104 10*3/uL — ABNORMAL LOW (ref 150–400)
RBC: 2.62 MIL/uL — ABNORMAL LOW (ref 4.22–5.81)
RDW: 16 % — ABNORMAL HIGH (ref 11.5–15.5)
Smear Review: NORMAL
WBC: 11.4 10*3/uL — ABNORMAL HIGH (ref 4.0–10.5)
nRBC: 0 % (ref 0.0–0.2)

## 2020-05-27 LAB — BASIC METABOLIC PANEL
Anion gap: 8 (ref 5–15)
BUN: 25 mg/dL — ABNORMAL HIGH (ref 8–23)
CO2: 21 mmol/L — ABNORMAL LOW (ref 22–32)
Calcium: 8.5 mg/dL — ABNORMAL LOW (ref 8.9–10.3)
Chloride: 107 mmol/L (ref 98–111)
Creatinine, Ser: 1.58 mg/dL — ABNORMAL HIGH (ref 0.61–1.24)
GFR, Estimated: 42 mL/min — ABNORMAL LOW (ref >60)
Glucose, Bld: 97 mg/dL (ref 70–99)
Potassium: 3.8 mmol/L (ref 3.5–5.1)
Sodium: 136 mmol/L (ref 135–145)

## 2020-05-27 LAB — IRON AND TIBC
Iron: 49 ug/dL (ref 45–182)
Saturation Ratios: 20 % (ref 17.9–39.5)
TIBC: 244 ug/dL — ABNORMAL LOW (ref 250–450)
UIBC: 195 ug/dL

## 2020-05-27 LAB — LACTATE DEHYDROGENASE: LDH: 159 U/L (ref 98–192)

## 2020-05-27 LAB — FERRITIN: Ferritin: 440 ng/mL — ABNORMAL HIGH (ref 24–336)

## 2020-05-27 MED ORDER — SODIUM CHLORIDE 0.9 % IV SOLN
Freq: Once | INTRAVENOUS | Status: DC
  Filled 2020-05-27: qty 250

## 2020-05-27 MED ORDER — SODIUM CHLORIDE 0.9% FLUSH
3.0000 mL | Freq: Once | INTRAVENOUS | Status: DC | PRN
  Filled 2020-05-27: qty 3

## 2020-05-27 MED ORDER — SODIUM CHLORIDE 0.9% FLUSH
10.0000 mL | Freq: Once | INTRAVENOUS | Status: DC | PRN
  Filled 2020-05-27: qty 10

## 2020-05-27 MED ORDER — SODIUM CHLORIDE 0.9 % IV SOLN: 510.0000 mg | Freq: Once | INTRAVENOUS | Status: DC

## 2020-05-27 MED ORDER — ALTEPLASE 2 MG IJ SOLR
2.0000 mg | Freq: Once | INTRAMUSCULAR | Status: DC | PRN
  Filled 2020-05-27: qty 2

## 2020-05-27 MED ORDER — DARBEPOETIN ALFA 300 MCG/0.6ML IJ SOSY
300.0000 ug | PREFILLED_SYRINGE | Freq: Once | INTRAMUSCULAR | Status: AC
  Administered 2020-05-27: 300 ug via SUBCUTANEOUS
  Filled 2020-05-27: qty 0.6

## 2020-05-27 MED ORDER — HEPARIN SOD (PORK) LOCK FLUSH 100 UNIT/ML IV SOLN
500.0000 [IU] | Freq: Once | INTRAVENOUS | Status: DC | PRN
  Filled 2020-05-27: qty 5

## 2020-05-27 MED ORDER — HEPARIN SOD (PORK) LOCK FLUSH 100 UNIT/ML IV SOLN
250.0000 [IU] | Freq: Once | INTRAVENOUS | Status: DC | PRN
  Filled 2020-05-27: qty 5

## 2020-05-27 NOTE — Progress Notes (Signed)
[place] Interval Progress Note  Patient Care Team: Rusty Aus, MD as PCP - General (Internal Medicine) Alisa Graff, FNP as Nurse Practitioner (Family Medicine) Ubaldo Glassing Javier Docker, MD as Consulting Physician (Cardiology) Erby Pian, MD as Referring Physician (Specialist) Cammie Sickle, MD as Consulting Physician (Hematology and Oncology)  CHIEF COMPLAINTS/PURPOSE OF CONSULTATION:   # 2012- Anemia 11-12- sec to CKD/IDA? MDS [no BMBx] [March 2017-sat-11%; ferritin-42] [Feb 2017- EGD/colo-July 2015 Dr.Skulskie];April 2017-PO Iron PO; May 2020-IV iron/Aranesp.  # Intermittent thrombocytopenia 120s-140s [Dec 2016-CT- neg cirrhosis/splenomegaly]  #June 2020 -LUL cystic lesion- 4x4cm ?  Question etiology [slightly increased from 2017]; question postinflammatory versus low-grade adenocarcinoma--August 2021 CT scan-overall stable.  # CKD [creatinine 1.2- 1.4]; CAD/CHF; March 2021-acalculous cholecystitis/sepsis [ICU]; s/p cholecystostomy tube placement  HISTORY OF PRESENTING ILLNESS: Andres Gutierrez 87 y.o.  male with above history of chronic anemia and intermittent thrombocytopenia who returns to clinic for follow-up and consideration of IV iron and Aranesp.  Patient is feeling well shortness of breath.  Has chronic mild fatigue worse in the afternoon.  Chronic mild swelling is noted little worse.  No interval hospitalizations or blood transfusions.  Last received iron in August 2021.  Last received Aranesp in December 2021.  Has tolerated Feraheme in the past without significant side effects or complications.  Review of Systems  Constitutional: Positive for malaise/fatigue. Negative for chills, diaphoresis, fever and weight loss.  HENT: Negative for nosebleeds and sore throat.   Eyes: Negative for double vision.  Respiratory: Negative for cough, hemoptysis, sputum production, shortness of breath and wheezing.   Cardiovascular: Negative for chest pain,  palpitations, orthopnea and leg swelling.  Gastrointestinal: Negative for abdominal pain, blood in stool, constipation, diarrhea, heartburn, melena, nausea and vomiting.  Genitourinary: Negative for dysuria, frequency and urgency.  Musculoskeletal: Negative for back pain and joint pain.  Skin: Negative.  Negative for itching and rash.  Neurological: Negative for dizziness, tingling, focal weakness, weakness and headaches.  Endo/Heme/Allergies: Does not bruise/bleed easily.  Psychiatric/Behavioral: Negative for depression. The patient is not nervous/anxious and does not have insomnia.    MEDICAL HISTORY:  Past Medical History:  Diagnosis Date  . Anemia   . Anginal pain ([place])   . Anxiety   . Cardiomyopathy ([place])   . Cerebral amyloid angiopathy ([place])   . CHF (congestive heart failure) ([place])   . Chronic kidney disease    kidney stones  . Coronary artery disease   . Cough   . Dyspnea   . GERD (gastroesophageal reflux disease)   . History of kidney stones   . Hypertension   . Lymphadenopathy, hilar   . MI (myocardial infarction) ([place])    x 2  . Wheezing     SURGICAL HISTORY: Past Surgical History:  Procedure Laterality Date  . BRONCHIAL NEEDLE ASPIRATION BIOPSY N/A 12/31/2014   Procedure: BRONCHIAL NEEDLE ASPIRATION BIOPSIES from carina;  Surgeon: Flora Lipps, MD;  Location: [facility] ORS;  Service: Cardiopulmonary;  Laterality: N/A;  . CARDIAC CATHETERIZATION N/A 12/28/2015   Procedure: Left Heart Cath and Cors/Grafts Angiography;  Surgeon: Yolonda Kida, MD;  Location: [place] [facility];  Service: Cardiovascular;  Laterality: N/A;  . CATARACT EXTRACTION W/PHACO Left 08/01/2016   Procedure: CATARACT EXTRACTION PHACO AND INTRAOCULAR LENS PLACEMENT ([place]) Left;  Surgeon: Leandrew Koyanagi, MD;  Location: [place];  Service: Ophthalmology;  Laterality: Left;  . CHOLECYSTECTOMY    . COLON SURGERY    . CORONARY ANGIOPLASTY WITH STENT PLACEMENT    .  CORONARY ARTERY BYPASS  GRAFT    . CYSTOSCOPY/URETEROSCOPY/HOLMIUM LASER/STENT PLACEMENT Left 03/18/2020   Procedure: CYSTOSCOPY/URETEROSCOPY/HOLMIUM LASER/STENT PLACEMENT;  Surgeon: Billey Co, MD;  Location: [facility] OR[facility];  Service: Urology;  Laterality: Left;  . ENDOBRONCHIAL ULTRASOUND N/A 12/31/2014   Procedure: ENDOBRONCHIAL ULTRASOUND;  Surgeon: Flora Lipps, MD;  Location: [facility];  Service: Cardiopulmonary;  Laterality: N/A;  . ESOPHAGOGASTRODUODENOSCOPY (EGD) WITH PROPOFOL N/A 06/20/2015   Procedure: ESOPHAGOGASTRODUODENOSCOPY (EGD) WITH PROPOFOL;  Surgeon: Lollie Sails, MD;  Location: [facility] ENDOSCOPY;  Service: Endoscopy;  Laterality: N/A;  . IR CATHETER TUBE CHANGE  10/26/2019  . IR CHOLANGIOGRAM EXISTING TUBE  08/13/2019    SOCIAL HISTORY: lives in [place]; alone. 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  Social History   Socioeconomic History  . Marital status: Widowed    Spouse name: Not on file  . Number of children: 2  . Years of education: college  . Highest education level: Some college, no degree  Occupational History  . Occupation: retired  Tobacco Use  . Smoking status: Former Smoker    Quit date: 06/27/1977    Years since quitting: 42.9  . Smokeless tobacco: Current User    Types: Chew  Vaping Use  . Vaping Use: Never used  Substance and Sexual Activity  . Alcohol use: Yes    Comment: occational almost rare once a year  . Drug use: No  . Sexual activity: Not Currently    Birth control/protection: Abstinence  Other Topics Concern  . Not on file  Social History Narrative  . Not on file   Social Determinants of Health   Financial Resource Strain: Not on file  Food Insecurity: Not on file  Transportation Needs: Not on file  Physical Activity: Not on file  Stress: Not on file  Social Connections: Not on file  Intimate Partner Violence: Not on file   Immunization History  Administered Date(s) Administered  . Fluad Quad(high Dose 65+) 03/04/2019  . Influenza Inj Mdck Quad Pf 03/06/2018  .  Influenza,inj,Quad PF,6+ Mos 01/25/2016  . Influenza-Unspecified 01/25/2016, 02/25/2017, 03/06/2018  . PFIZER(Purple Top)SARS-COV-2 Vaccination 05/25/2019, 06/30/2019  . Pneumococcal Conjugate-13 12/05/2016  . Pneumococcal Polysaccharide-23 08/29/1995, 12/24/2015  . Tdap 01/29/2011  . Zoster 01/29/2011     FAMILY HISTORY: Family History  Problem Relation Age of Onset  . CAD Mother   . Colon cancer Father     ALLERGIES:  is allergic to levaquin [levofloxacin], escitalopram, 5ht3 receptor antagonists, diltiazem, diphenhydramine hcl, maxidex [dexamethasone], prednisone, serotonin, vytorin [ezetimibe-simvastatin], and zocor [simvastatin].  MEDICATIONS:  Current Outpatient Medications  Medication Sig Dispense Refill  . Ascorbic Acid (VITAMIN C PO) Take 1 tablet by mouth daily.    Marland Kitchen aspirin 81 MG EC tablet Take 1 tablet (81 mg total) by mouth daily. RESUME 48HRS after surgery (Patient taking differently: Take 81 mg by mouth daily.) 30 tablet 12  . atorvastatin (LIPITOR) 40 MG tablet Take 1 tablet (40 mg total) by mouth daily with supper.    . digoxin (LANOXIN) 0.125 MG tablet Take 0.0625 mg by mouth daily.    . famotidine (PEPCID) 20 MG tablet Take 20 mg by mouth at bedtime.    . feeding supplement (ENSURE ENLIVE / ENSURE PLUS) LIQD Take 237 mLs by mouth 2 (two) times daily between meals. 10000 mL 0  . ferrous sulfate 325 (65 FE) MG tablet Take 1 tablet (325 mg total) by mouth every Monday, Wednesday, and Friday. With supper    . furosemide (LASIX) 20 MG tablet Take 1 tablet (  20 mg total) by mouth daily as needed (for weight gain of 3lbs in 1 day or 5 lbs in 2 days or shortness of breath or leg swelling). 30 tablet 0  . metoprolol tartrate (LOPRESSOR) 25 MG tablet Take 12.5 mg by mouth at bedtime.    . Multiple Vitamin (MULTIVITAMIN) tablet Take 1 tablet by mouth daily.     Marland Kitchen neomycin-bacitracin-polymyxin (NEOSPORIN) ointment Apply 1 application topically every other day. Wound care (foot  area)    . pantoprazole (PROTONIX) 20 MG tablet Take 20 mg by mouth daily before breakfast.    . PRESCRIPTION MEDICATION     . ranolazine (RANEXA) 1000 MG SR tablet Take 500 mg by mouth 2 (two) times daily.     . sucralfate (CARAFATE) 1 G tablet Take 1 g by mouth 2 (two) times daily.     . tamsulosin (FLOMAX) 0.4 MG CAPS capsule Take 0.4 mg by mouth daily after supper.     . vitamin B-12 (CYANOCOBALAMIN) 500 MCG tablet Take 500 mcg by mouth daily.     No current facility-administered medications for this visit.     PHYSICAL EXAMINATION: ECOG PERFORMANCE STATUS: 0 - Asymptomatic  Vitals:   05/27/20 1051  BP: (!) 122/59  Pulse: 81  Resp: 16  Temp: 98.3 F (36.8 C)  SpO2: 98%   Filed Weights   05/27/20 1051  Weight: 186 lb (84.4 kg)    Physical Exam Constitutional:      Comments: Unaccompanied. Appears stated age.   HENT:     Head: Normocephalic and atraumatic.     Mouth/Throat:     Pharynx: No oropharyngeal exudate.  Eyes:     General: No scleral icterus.    Conjunctiva/sclera: Conjunctivae normal.  Cardiovascular:     Rate and Rhythm: Normal rate and regular rhythm.  Pulmonary:     Effort: No respiratory distress.     Breath sounds: Normal breath sounds. No wheezing.  Abdominal:     General: Bowel sounds are normal. There is no distension.     Palpations: Abdomen is soft. There is no mass.     Tenderness: There is no abdominal tenderness. There is no guarding or rebound.  Musculoskeletal:        General: No tenderness. Normal range of motion.     Cervical back: Normal range of motion and neck supple.     Comments: Ambulates w/o aids  Skin:    General: Skin is warm and dry.  Neurological:     Mental Status: He is alert and oriented to person, place, and time.  Psychiatric:        Mood and Affect: Mood and affect normal.        Behavior: Behavior normal.     LABORATORY DATA:  I have reviewed the data as listed Lab Results  Component Value Date   WBC 11.4  (H) 05/27/2020   HGB 8.8 (L) 05/27/2020   HCT 26.7 (L) 05/27/2020   MCV 101.9 (H) 05/27/2020   PLT 104 (L) 05/27/2020   Recent Labs    10/21/19 1231 11/06/19 1429 11/11/19 1104 01/07/20 0916 02/18/20 1036 02/26/20 0542 02/27/20 1256 03/03/20 0858 05/27/20 1020  NA  --   --  139 137   < > 137 136 138 136  K  --   --  4.6 3.8   < > 4.4 3.8 4.4 3.8  CL  --   --  104 104   < > 102 102 105 107  CO2  --   --  26 23   < > _0 21*  GLUCOSE  --   --  112* 119*   < > 101* 117* 100* 97  BUN  --   --  21 23   < > 30* 23 29* 25*  CREATININE  --  1.90* 1.51* 1.58*   < > 1.77* 1.51* 1.85* 1.58*  CALCIUM  --   --  9.0 8.7*   < > 9.1 9.2 9.2 8.5*  GFRNONAA  --  31* 41* 39*   < > 34* 41* 35* 42*  GFRAA  --  36* 48* 45*  --   --   --   --   --   PROT 7.7  --   --   --   --  7.5  --  7.7  --   ALBUMIN 4.4  --   --   --   --  4.3  --  4.4  --   AST 23  --   --   --   --  19  --  20  --   ALT 28  --   --   --   --  17  --  16  --   ALKPHOS 56  --   --   --   --  55  --  57  --   BILITOT 1.4*  --   --   --   --  0.9  --  1.1  --   BILIDIR 0.2  --   --   --   --   --   --   --   --   IBILI 1.2*  --   --   --   --   --   --   --   --    < > = values in this interval not displayed.   ASSESSMENT & PLAN:   No problem-specific Assessment & Plan notes found for this encounter. 1. Anemia- since 2012. LIkely secondary to MDS &/or CKD. No bone marrow biopsy. Hemoglobin worse today. 8.8 (previously 9-10). LDH stable 159.  Proceed with Aranesp today. Repeat in 2 weeks.   2. Iron Deficiency- Ferritin 440, Iron Saturation 20. Fe likely reactive. Continue PO Iron.   3. Mild Intermittent Thrombocytopenia- question ITP- nadir November 2020- 80s. Plts today- 104. Aspirin ok. Monitor.   4. CHF- 20-25% EF [facility]). On lasix 20 mg/day. Stable.   5. CKD- stage III. GFR 33. Stable.   6. Left upper lobe cystic lesion- incidental lesion. CT 01/06/20- 4.5 cm- slightly larger by few mm but essentially  stable. Monitor.   7. Post covid- diagnosed 12/31. Pneumonia. PCP managing and weaning nebulizers.   Disposition:  Proceed with aranesp today 2 weeks- labs (cbc) and possible aranesp vs feraheme 4 weeks- labs (cbc, bmp, ldh), MD, possible aranesp   Beckey Rutter, DNP, AGNP-C [place] at [facility] [phone] (clinic)

## 2020-06-01 ENCOUNTER — Telehealth: Payer: Self-pay | Admitting: Internal Medicine

## 2020-06-01 DIAGNOSIS — D500 Iron deficiency anemia secondary to blood loss (chronic): Secondary | ICD-10-CM | POA: Diagnosis not present

## 2020-06-01 DIAGNOSIS — I517 Cardiomegaly: Secondary | ICD-10-CM | POA: Diagnosis not present

## 2020-06-01 DIAGNOSIS — J1282 Pneumonia due to coronavirus disease 2019: Secondary | ICD-10-CM | POA: Diagnosis not present

## 2020-06-01 DIAGNOSIS — Z23 Encounter for immunization: Secondary | ICD-10-CM | POA: Diagnosis not present

## 2020-06-01 DIAGNOSIS — U071 COVID-19: Secondary | ICD-10-CM | POA: Diagnosis not present

## 2020-06-01 DIAGNOSIS — J189 Pneumonia, unspecified organism: Secondary | ICD-10-CM | POA: Diagnosis not present

## 2020-06-01 NOTE — Telephone Encounter (Signed)
Pt's daughter stated pt was last seen by Beckey Rutter and left without getting any f/u appointment. Pt's dautghter requested return call to do so.

## 2020-06-01 NOTE — Telephone Encounter (Signed)
Pt was last seen by lauren and left without getting f/u appointments pt's daughter requests call back to schedule appointments [phone]

## 2020-06-05 DIAGNOSIS — M79604 Pain in right leg: Secondary | ICD-10-CM | POA: Diagnosis not present

## 2020-06-05 DIAGNOSIS — I252 Old myocardial infarction: Secondary | ICD-10-CM | POA: Diagnosis not present

## 2020-06-05 DIAGNOSIS — R0602 Shortness of breath: Secondary | ICD-10-CM | POA: Diagnosis not present

## 2020-06-05 DIAGNOSIS — I445 Left posterior fascicular block: Secondary | ICD-10-CM | POA: Diagnosis not present

## 2020-06-05 DIAGNOSIS — N183 Chronic kidney disease, stage 3 unspecified: Secondary | ICD-10-CM | POA: Diagnosis not present

## 2020-06-05 DIAGNOSIS — Z79899 Other long term (current) drug therapy: Secondary | ICD-10-CM | POA: Diagnosis not present

## 2020-06-05 DIAGNOSIS — R1032 Left lower quadrant pain: Secondary | ICD-10-CM | POA: Diagnosis not present

## 2020-06-05 DIAGNOSIS — R197 Diarrhea, unspecified: Secondary | ICD-10-CM | POA: Diagnosis not present

## 2020-06-05 DIAGNOSIS — Z888 Allergy status to other drugs, medicaments and biological substances status: Secondary | ICD-10-CM | POA: Diagnosis not present

## 2020-06-05 DIAGNOSIS — R519 Headache, unspecified: Secondary | ICD-10-CM | POA: Diagnosis not present

## 2020-06-05 DIAGNOSIS — H538 Other visual disturbances: Secondary | ICD-10-CM | POA: Diagnosis not present

## 2020-06-05 DIAGNOSIS — F17220 Nicotine dependence, chewing tobacco, uncomplicated: Secondary | ICD-10-CM | POA: Diagnosis not present

## 2020-06-05 DIAGNOSIS — Z9049 Acquired absence of other specified parts of digestive tract: Secondary | ICD-10-CM | POA: Diagnosis not present

## 2020-06-05 DIAGNOSIS — I4891 Unspecified atrial fibrillation: Secondary | ICD-10-CM | POA: Diagnosis not present

## 2020-06-05 DIAGNOSIS — Z881 Allergy status to other antibiotic agents status: Secondary | ICD-10-CM | POA: Diagnosis not present

## 2020-06-05 DIAGNOSIS — I130 Hypertensive heart and chronic kidney disease with heart failure and stage 1 through stage 4 chronic kidney disease, or unspecified chronic kidney disease: Secondary | ICD-10-CM | POA: Diagnosis not present

## 2020-06-05 DIAGNOSIS — M79605 Pain in left leg: Secondary | ICD-10-CM | POA: Diagnosis not present

## 2020-06-05 DIAGNOSIS — K219 Gastro-esophageal reflux disease without esophagitis: Secondary | ICD-10-CM | POA: Diagnosis not present

## 2020-06-05 DIAGNOSIS — I509 Heart failure, unspecified: Secondary | ICD-10-CM | POA: Diagnosis not present

## 2020-06-08 DIAGNOSIS — I482 Chronic atrial fibrillation, unspecified: Secondary | ICD-10-CM | POA: Diagnosis not present

## 2020-06-08 DIAGNOSIS — Z23 Encounter for immunization: Secondary | ICD-10-CM | POA: Diagnosis not present

## 2020-06-08 DIAGNOSIS — K922 Gastrointestinal hemorrhage, unspecified: Secondary | ICD-10-CM | POA: Diagnosis not present

## 2020-06-08 DIAGNOSIS — I5022 Chronic systolic (congestive) heart failure: Secondary | ICD-10-CM | POA: Diagnosis not present

## 2020-06-08 DIAGNOSIS — R1084 Generalized abdominal pain: Secondary | ICD-10-CM | POA: Diagnosis not present

## 2020-06-08 DIAGNOSIS — I420 Dilated cardiomyopathy: Secondary | ICD-10-CM | POA: Diagnosis not present

## 2020-06-10 ENCOUNTER — Ambulatory Visit: Payer: PPO

## 2020-06-10 ENCOUNTER — Other Ambulatory Visit: Payer: PPO

## 2020-06-13 ENCOUNTER — Inpatient Hospital Stay: Payer: PPO

## 2020-06-13 ENCOUNTER — Other Ambulatory Visit: Payer: Self-pay | Admitting: Nurse Practitioner

## 2020-06-13 VITALS — BP 146/63 | HR 79

## 2020-06-13 DIAGNOSIS — D631 Anemia in chronic kidney disease: Secondary | ICD-10-CM

## 2020-06-13 DIAGNOSIS — D500 Iron deficiency anemia secondary to blood loss (chronic): Secondary | ICD-10-CM

## 2020-06-13 DIAGNOSIS — I129 Hypertensive chronic kidney disease with stage 1 through stage 4 chronic kidney disease, or unspecified chronic kidney disease: Secondary | ICD-10-CM | POA: Diagnosis not present

## 2020-06-13 DIAGNOSIS — N183 Chronic kidney disease, stage 3 unspecified: Secondary | ICD-10-CM

## 2020-06-13 LAB — CBC WITH DIFFERENTIAL/PLATELET
Abs Immature Granulocytes: 0.12 10*3/uL — ABNORMAL HIGH (ref 0.00–0.07)
Basophils Absolute: 0 10*3/uL (ref 0.0–0.1)
Basophils Relative: 0 %
Eosinophils Absolute: 0.1 10*3/uL (ref 0.0–0.5)
Eosinophils Relative: 1 %
HCT: 25.1 % — ABNORMAL LOW (ref 39.0–52.0)
Hemoglobin: 8 g/dL — ABNORMAL LOW (ref 13.0–17.0)
Immature Granulocytes: 1 %
Lymphocytes Relative: 31 %
Lymphs Abs: 2.8 10*3/uL (ref 0.7–4.0)
MCH: 33.3 pg (ref 26.0–34.0)
MCHC: 31.9 g/dL (ref 30.0–36.0)
MCV: 104.6 fL — ABNORMAL HIGH (ref 80.0–100.0)
Monocytes Absolute: 2.2 10*3/uL — ABNORMAL HIGH (ref 0.1–1.0)
Monocytes Relative: 24 %
Neutro Abs: 3.7 10*3/uL (ref 1.7–7.7)
Neutrophils Relative %: 43 %
Platelets: 226 10*3/uL (ref 150–400)
RBC: 2.4 MIL/uL — ABNORMAL LOW (ref 4.22–5.81)
RDW: 16.8 % — ABNORMAL HIGH (ref 11.5–15.5)
Smear Review: NORMAL
WBC: 8.9 10*3/uL (ref 4.0–10.5)
nRBC: 0 % (ref 0.0–0.2)

## 2020-06-13 MED ORDER — DARBEPOETIN ALFA 300 MCG/0.6ML IJ SOSY
300.0000 ug | PREFILLED_SYRINGE | Freq: Once | INTRAMUSCULAR | Status: AC
  Administered 2020-06-13: 300 ug via SUBCUTANEOUS
  Filled 2020-06-13: qty 0.6

## 2020-06-13 NOTE — Progress Notes (Signed)
Hgb 8 per Lab

## 2020-06-13 NOTE — Progress Notes (Signed)
Feraheme discontinued due to insurance. Treatment plan adjusted to venofer. Given drop in hemoglobin will schedule for venofer 200 mg x 2 and continue aranesp as scheduled.

## 2020-06-20 ENCOUNTER — Other Ambulatory Visit: Payer: Self-pay

## 2020-06-20 ENCOUNTER — Inpatient Hospital Stay: Payer: PPO | Attending: Internal Medicine

## 2020-06-20 VITALS — BP 134/55 | HR 64 | Resp 18

## 2020-06-20 DIAGNOSIS — L97511 Non-pressure chronic ulcer of other part of right foot limited to breakdown of skin: Secondary | ICD-10-CM | POA: Diagnosis not present

## 2020-06-20 DIAGNOSIS — I129 Hypertensive chronic kidney disease with stage 1 through stage 4 chronic kidney disease, or unspecified chronic kidney disease: Secondary | ICD-10-CM | POA: Diagnosis not present

## 2020-06-20 DIAGNOSIS — D509 Iron deficiency anemia, unspecified: Secondary | ICD-10-CM | POA: Diagnosis not present

## 2020-06-20 DIAGNOSIS — I2510 Atherosclerotic heart disease of native coronary artery without angina pectoris: Secondary | ICD-10-CM | POA: Insufficient documentation

## 2020-06-20 DIAGNOSIS — Z87891 Personal history of nicotine dependence: Secondary | ICD-10-CM | POA: Diagnosis not present

## 2020-06-20 DIAGNOSIS — N183 Chronic kidney disease, stage 3 unspecified: Secondary | ICD-10-CM | POA: Diagnosis not present

## 2020-06-20 DIAGNOSIS — Z79899 Other long term (current) drug therapy: Secondary | ICD-10-CM | POA: Insufficient documentation

## 2020-06-20 DIAGNOSIS — D500 Iron deficiency anemia secondary to blood loss (chronic): Secondary | ICD-10-CM

## 2020-06-20 DIAGNOSIS — G609 Hereditary and idiopathic neuropathy, unspecified: Secondary | ICD-10-CM | POA: Diagnosis not present

## 2020-06-20 DIAGNOSIS — I509 Heart failure, unspecified: Secondary | ICD-10-CM | POA: Insufficient documentation

## 2020-06-20 DIAGNOSIS — L97521 Non-pressure chronic ulcer of other part of left foot limited to breakdown of skin: Secondary | ICD-10-CM | POA: Diagnosis not present

## 2020-06-20 DIAGNOSIS — D631 Anemia in chronic kidney disease: Secondary | ICD-10-CM | POA: Insufficient documentation

## 2020-06-20 MED ORDER — IRON SUCROSE 20 MG/ML IV SOLN
200.0000 mg | Freq: Once | INTRAVENOUS | Status: AC
  Administered 2020-06-20: 200 mg via INTRAVENOUS
  Filled 2020-06-20: qty 10

## 2020-06-20 MED ORDER — SODIUM CHLORIDE 0.9 % IV SOLN
Freq: Once | INTRAVENOUS | Status: AC
  Filled 2020-06-20: qty 250

## 2020-06-20 MED ORDER — SODIUM CHLORIDE 0.9 % IV SOLN: 200.0000 mg | Freq: Once | INTRAVENOUS | Status: DC

## 2020-06-21 DIAGNOSIS — D696 Thrombocytopenia, unspecified: Secondary | ICD-10-CM | POA: Diagnosis not present

## 2020-06-21 DIAGNOSIS — I420 Dilated cardiomyopathy: Secondary | ICD-10-CM | POA: Diagnosis not present

## 2020-06-21 DIAGNOSIS — I25810 Atherosclerosis of coronary artery bypass graft(s) without angina pectoris: Secondary | ICD-10-CM | POA: Diagnosis not present

## 2020-06-21 DIAGNOSIS — I482 Chronic atrial fibrillation, unspecified: Secondary | ICD-10-CM | POA: Diagnosis not present

## 2020-06-21 DIAGNOSIS — E782 Mixed hyperlipidemia: Secondary | ICD-10-CM | POA: Diagnosis not present

## 2020-06-21 DIAGNOSIS — I5022 Chronic systolic (congestive) heart failure: Secondary | ICD-10-CM | POA: Diagnosis not present

## 2020-06-21 DIAGNOSIS — N1831 Chronic kidney disease, stage 3a: Secondary | ICD-10-CM | POA: Diagnosis not present

## 2020-06-21 DIAGNOSIS — I10 Essential (primary) hypertension: Secondary | ICD-10-CM | POA: Diagnosis not present

## 2020-06-24 ENCOUNTER — Other Ambulatory Visit: Payer: Self-pay

## 2020-06-24 ENCOUNTER — Inpatient Hospital Stay (HOSPITAL_BASED_OUTPATIENT_CLINIC_OR_DEPARTMENT_OTHER): Payer: PPO | Admitting: Internal Medicine

## 2020-06-24 ENCOUNTER — Inpatient Hospital Stay: Payer: PPO

## 2020-06-24 VITALS — BP 125/62 | HR 73 | Temp 97.2°F | Resp 18 | Ht 76.0 in | Wt 187.0 lb

## 2020-06-24 DIAGNOSIS — N183 Chronic kidney disease, stage 3 unspecified: Secondary | ICD-10-CM | POA: Diagnosis not present

## 2020-06-24 DIAGNOSIS — D631 Anemia in chronic kidney disease: Secondary | ICD-10-CM

## 2020-06-24 DIAGNOSIS — D500 Iron deficiency anemia secondary to blood loss (chronic): Secondary | ICD-10-CM

## 2020-06-24 DIAGNOSIS — I129 Hypertensive chronic kidney disease with stage 1 through stage 4 chronic kidney disease, or unspecified chronic kidney disease: Secondary | ICD-10-CM | POA: Diagnosis not present

## 2020-06-24 LAB — BASIC METABOLIC PANEL
Anion gap: 9 (ref 5–15)
BUN: 28 mg/dL — ABNORMAL HIGH (ref 8–23)
CO2: 23 mmol/L (ref 22–32)
Calcium: 8.9 mg/dL (ref 8.9–10.3)
Chloride: 106 mmol/L (ref 98–111)
Creatinine, Ser: 1.82 mg/dL — ABNORMAL HIGH (ref 0.61–1.24)
GFR, Estimated: 36 mL/min — ABNORMAL LOW (ref >60)
Glucose, Bld: 113 mg/dL — ABNORMAL HIGH (ref 70–99)
Potassium: 4.8 mmol/L (ref 3.5–5.1)
Sodium: 138 mmol/L (ref 135–145)

## 2020-06-24 LAB — CBC WITH DIFFERENTIAL/PLATELET
Abs Immature Granulocytes: 0.1 10*3/uL — ABNORMAL HIGH (ref 0.00–0.07)
Basophils Absolute: 0 10*3/uL (ref 0.0–0.1)
Basophils Relative: 0 %
Eosinophils Absolute: 0.1 10*3/uL (ref 0.0–0.5)
Eosinophils Relative: 1 %
HCT: 25.4 % — ABNORMAL LOW (ref 39.0–52.0)
Hemoglobin: 8 g/dL — ABNORMAL LOW (ref 13.0–17.0)
Immature Granulocytes: 1 %
Lymphocytes Relative: 27 %
Lymphs Abs: 2.5 10*3/uL (ref 0.7–4.0)
MCH: 34 pg (ref 26.0–34.0)
MCHC: 31.5 g/dL (ref 30.0–36.0)
MCV: 108.1 fL — ABNORMAL HIGH (ref 80.0–100.0)
Monocytes Absolute: 2.8 10*3/uL — ABNORMAL HIGH (ref 0.1–1.0)
Monocytes Relative: 31 %
Neutro Abs: 3.7 10*3/uL (ref 1.7–7.7)
Neutrophils Relative %: 40 %
Platelets: 224 10*3/uL (ref 150–400)
RBC: 2.35 MIL/uL — ABNORMAL LOW (ref 4.22–5.81)
RDW: 18.9 % — ABNORMAL HIGH (ref 11.5–15.5)
Smear Review: NORMAL
WBC: 9.2 10*3/uL (ref 4.0–10.5)
nRBC: 0 % (ref 0.0–0.2)

## 2020-06-24 LAB — IRON AND TIBC
Iron: 52 ug/dL (ref 45–182)
Saturation Ratios: 19 % (ref 17.9–39.5)
TIBC: 281 ug/dL (ref 250–450)
UIBC: 229 ug/dL

## 2020-06-24 LAB — FERRITIN: Ferritin: 253 ng/mL (ref 24–336)

## 2020-06-24 LAB — LACTATE DEHYDROGENASE: LDH: 165 U/L (ref 98–192)

## 2020-06-24 MED ORDER — DARBEPOETIN ALFA 300 MCG/0.6ML IJ SOSY
300.0000 ug | PREFILLED_SYRINGE | Freq: Once | INTRAMUSCULAR | Status: AC
  Administered 2020-06-24: 300 ug via SUBCUTANEOUS
  Filled 2020-06-24: qty 0.6

## 2020-06-24 NOTE — Assessment & Plan Note (Signed)
#  Anemia-mild.  Since 2012.  Suspect MDS/chronic kidney disease.  No bone marrow biopsy; slowly worsening.  Discussed with the patient and son regarding the concerns for progressive anemia-question underlying MDS.  Recommend a bone marrow biopsy.  This was discussed previously.  Patient had declined.  However is more open to the idea of bone marrow biopsy at this time. Given significant discomfort involved-I would recommend under anesthesia/with radiology in the hospital. I discussed the potential complications include-bleeding/trauma and risk of infection; which are fortunately very rare.  Bone marrow biopsy/aspiration will be ordered after discussion with family.  # Hemoglobin 8.0  proceed with Aranesp today.  Continue p.o. iron.novl 2021- iron sat-20%.  Proceed with IV iron infusion.  # Mild intermittent thrombocytopenia-question ITP [nadir November 2020-80s].  Today platelets 250s ; ok to with asprin. STABLE.   #Recent acute CHF-2025% ejection fraction [[facility]]-see below ;on  Lasix 20 mg a day [half pill once a day]; STABLE.   #CKD stage III-GFR-33; STABLE.   #Left upper lobe cystic lesion-[incidentally] CT scan- AUG 25th- 2021- CT scan-left upper lobe cystic lesion-4.5 cm-/slightly bigger by few mm- STABLE.   #Disposition: #  Proceed with Aranesp today # in 2 weeks- cbc possible aranesp # in 4 weeks MD-cbc/bmp; possible aranesp-Dr.B

## 2020-06-24 NOTE — Progress Notes (Signed)
[place] NOTE  Patient Care Team: Rusty Aus, MD as PCP - General (Internal Medicine) Alisa Graff, FNP as Nurse Practitioner (Family Medicine) Ubaldo Glassing Javier Docker, MD as Consulting Physician (Cardiology) Erby Pian, MD as Referring Physician (Specialist) Cammie Sickle, MD as Consulting Physician (Hematology and Oncology)  CHIEF COMPLAINTS/PURPOSE OF CONSULTATION:   # 2012- Anemia 11-12- sec to CKD/IDA? MDS [no BMBx] [March 2017-sat-11%; ferritin-42] [Feb 2017- EGD/colo-July 2015 Dr.Skulskie];April 2017-PO Iron PO; May 2020-IV iron/Aranesp.  # Intermittent thrombocytopenia 120s-140s [Dec 2016-CT- neg cirrhosis/splenomegaly]  #June 2020 -LUL cystic lesion- 4x4cm ?  Question etiology [slightly increased from 2017]; question postinflammatory versus low-grade adenocarcinoma--August 2021 CT scan-overall stable.  # CKD [creatinine 1.2- 1.4]; CAD/CHF; March 2021-acalculous cholecystitis/sepsis [ICU]; s/p cholecystostomy tube placement  HISTORY OF PRESENTING ILLNESS:  Andres Gutierrez 87 y.o.  male history of above history of chronic anemia and intermittent thrombocytopenia is here for follow-up/ currently on IV iron and also Aranesp here for follow-up.  Of note patient noted to have hemoglobin on 8 in the last many weeks.  Patient has not needed blood transfusions.  However needing IV iron infusions.  Continues to have shortness of breath on exertion.  Chronic mild fatigue.  No worsening swelling in the legs.   Review of Systems  Constitutional: Positive for malaise/fatigue. Negative for chills, diaphoresis, fever and weight loss.  HENT: Negative for nosebleeds and sore throat.   Eyes: Negative for double vision.  Respiratory: Positive for shortness of breath. Negative for cough, hemoptysis, sputum production and wheezing.   Cardiovascular: Negative for chest pain, palpitations, orthopnea and leg swelling.  Gastrointestinal: Negative for abdominal  pain, blood in stool, constipation, diarrhea, heartburn, melena, nausea and vomiting.  Genitourinary: Negative for dysuria, frequency and urgency.  Musculoskeletal: Positive for back pain and joint pain.  Skin: Negative.  Negative for itching and rash.  Neurological: Negative for dizziness, tingling, focal weakness, weakness and headaches.  Endo/Heme/Allergies: Does not bruise/bleed easily.  Psychiatric/Behavioral: Negative for depression. The patient is not nervous/anxious and does not have insomnia.      MEDICAL HISTORY:  Past Medical History:  Diagnosis Date  . Anemia   . Anginal pain ([place])   . Anxiety   . Cardiomyopathy ([place])   . Cerebral amyloid angiopathy ([place])   . CHF (congestive heart failure) ([place])   . Chronic kidney disease    kidney stones  . Coronary artery disease   . Cough   . Dyspnea   . GERD (gastroesophageal reflux disease)   . History of kidney stones   . Hypertension   . Lymphadenopathy, hilar   . MI (myocardial infarction) ([place])    x 2  . Wheezing     SURGICAL HISTORY: Past Surgical History:  Procedure Laterality Date  . BRONCHIAL NEEDLE ASPIRATION BIOPSY N/A 12/31/2014   Procedure: BRONCHIAL NEEDLE ASPIRATION BIOPSIES from carina;  Surgeon: Flora Lipps, MD;  Location: [facility];  Service: Cardiopulmonary;  Laterality: N/A;  . CARDIAC CATHETERIZATION N/A 12/28/2015   Procedure: Left Heart Cath and Cors/Grafts Angiography;  Surgeon: Yolonda Kida, MD;  Location: [facility];  Service: Cardiovascular;  Laterality: N/A;  . CATARACT EXTRACTION W/PHACO Left 08/01/2016   Procedure: CATARACT EXTRACTION PHACO AND INTRAOCULAR LENS PLACEMENT ([place]) Left;  Surgeon: Leandrew Koyanagi, MD;  Location: [facility];  Service: Ophthalmology;  Laterality: Left;  . CHOLECYSTECTOMY    . COLON SURGERY    . CORONARY ANGIOPLASTY WITH STENT PLACEMENT    . CORONARY ARTERY  BYPASS GRAFT    . CYSTOSCOPY/URETEROSCOPY/HOLMIUM LASER/STENT PLACEMENT Left 03/18/2020    Procedure: CYSTOSCOPY/URETEROSCOPY/HOLMIUM LASER/STENT PLACEMENT;  Surgeon: Billey Co, MD;  Location: [facility];  Service: Urology;  Laterality: Left;  . ENDOBRONCHIAL ULTRASOUND N/A 12/31/2014   Procedure: ENDOBRONCHIAL ULTRASOUND;  Surgeon: Flora Lipps, MD;  Location: [facility];  Service: Cardiopulmonary;  Laterality: N/A;  . ESOPHAGOGASTRODUODENOSCOPY (EGD) WITH PROPOFOL N/A 06/20/2015   Procedure: ESOPHAGOGASTRODUODENOSCOPY (EGD) WITH PROPOFOL;  Surgeon: Lollie Sails, MD;  Location[facility];  Service: Endoscopy;  Laterality: N/A;  . IR CATHETER TUBE CHANGE  10/26/2019  . IR CHOLANGIOGRAM EXISTING TUBE  08/13/2019    SOCIAL HISTORY: lives in [place]; alone. 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  Social History   Socioeconomic History  . Marital status: Widowed    Spouse name: Not on file  . Number of children: 2  . Years of education: college  . Highest education level: Some college, no degree  Occupational History  . Occupation: retired  Tobacco Use  . Smoking status: Former Smoker    Quit date: 06/27/1977    Years since quitting: 43.0  . Smokeless tobacco: Current User    Types: Chew  Vaping Use  . Vaping Use: Never used  Substance and Sexual Activity  . Alcohol use: Yes    Comment: occational almost rare once a year  . Drug use: No  . Sexual activity: Not Currently    Birth control/protection: Abstinence  Other Topics Concern  . Not on file  Social History Narrative  . Not on file   Social Determinants of Health   Financial Resource Strain: Not on file  Food Insecurity: Not on file  Transportation Needs: Not on file  Physical Activity: Not on file  Stress: Not on file  Social Connections: Not on file  Intimate Partner Violence: Not on file    FAMILY HISTORY: Family History  Problem Relation Age of Onset  . CAD Mother   . Colon cancer Father     ALLERGIES:  is allergic to levaquin [levofloxacin], escitalopram, 5ht3 receptor antagonists, diltiazem,  diphenhydramine hcl, maxidex [dexamethasone], prednisone, serotonin, vytorin [ezetimibe-simvastatin], and zocor [simvastatin].  MEDICATIONS:  Current Outpatient Medications  Medication Sig Dispense Refill  . Ascorbic Acid (VITAMIN C PO) Take 1 tablet by mouth daily.    Marland Kitchen aspirin 81 MG EC tablet Take 1 tablet (81 mg total) by mouth daily. RESUME 48HRS after surgery (Patient taking differently: Take 81 mg by mouth daily.) 30 tablet 12  . atorvastatin (LIPITOR) 40 MG tablet Take 1 tablet (40 mg total) by mouth daily with supper.    . digoxin (LANOXIN) 0.125 MG tablet Take 0.0625 mg by mouth daily.    . famotidine (PEPCID) 20 MG tablet Take 20 mg by mouth at bedtime.    . feeding supplement (ENSURE ENLIVE / ENSURE PLUS) LIQD Take 237 mLs by mouth 2 (two) times daily between meals. 10000 mL 0  . ferrous sulfate 325 (65 FE) MG tablet Take 1 tablet (325 mg total) by mouth every Monday, Wednesday, and Friday. With supper    . furosemide (LASIX) 20 MG tablet Take 1 tablet (20 mg total) by mouth daily as needed (for weight gain of 3lbs in 1 day or 5 lbs in 2 days or shortness of breath or leg swelling). 30 tablet 0  . metoprolol tartrate (LOPRESSOR) 25 MG tablet Take 12.5 mg by mouth at bedtime.    . Multiple Vitamin (MULTIVITAMIN) tablet Take 1 tablet by mouth daily.     Marland Kitchen  neomycin-bacitracin-polymyxin (NEOSPORIN) ointment Apply 1 application topically every other day. Wound care (foot area)    . pantoprazole (PROTONIX) 20 MG tablet Take 20 mg by mouth daily before breakfast.    . PRESCRIPTION MEDICATION     . ranolazine (RANEXA) 1000 MG SR tablet Take 500 mg by mouth 2 (two) times daily.     . sucralfate (CARAFATE) 1 G tablet Take 1 g by mouth 2 (two) times daily.     . tamsulosin (FLOMAX) 0.4 MG CAPS capsule Take 0.4 mg by mouth daily after supper.     . vitamin B-12 (CYANOCOBALAMIN) 500 MCG tablet Take 500 mcg by mouth daily.     No current facility-administered medications for this visit.      PHYSICAL EXAMINATION: ECOG PERFORMANCE STATUS: 0 - Asymptomatic  Vitals:   06/24/20 1056  BP: 125/62  Pulse: 73  Resp: 18  Temp: (!) 97.2 F (36.2 C)   Filed Weights   06/24/20 1056  Weight: 187 lb (84.8 kg)    Physical Exam Constitutional:      Comments: Elderly Caucasian male patient. He accompanied by his daughter.  Patient ambulating independently.   HENT:     Head: Normocephalic and atraumatic.     Mouth/Throat:     Pharynx: No oropharyngeal exudate.  Eyes:     Pupils: Pupils are equal, round, and reactive to light.  Cardiovascular:     Rate and Rhythm: Normal rate and regular rhythm.  Pulmonary:     Effort: No respiratory distress.     Breath sounds: Normal breath sounds. No wheezing.  Abdominal:     General: Bowel sounds are normal. There is no distension.     Palpations: Abdomen is soft. There is no mass.     Tenderness: There is no abdominal tenderness. There is no guarding or rebound.  Musculoskeletal:        General: No tenderness. Normal range of motion.     Cervical back: Normal range of motion and neck supple.  Skin:    General: Skin is warm.  Neurological:     Mental Status: He is alert and oriented to person, place, and time.  Psychiatric:        Mood and Affect: Affect normal.    .   LABORATORY DATA:  I have reviewed the data as listed Lab Results  Component Value Date   WBC 9.2 06/24/2020   HGB 8.0 (L) 06/24/2020   HCT 25.4 (L) 06/24/2020   MCV 108.1 (H) 06/24/2020   PLT 224 06/24/2020   Recent Labs    10/21/19 1231 11/06/19 1429 11/11/19 1104 01/07/20 0916 02/18/20 1036 02/26/20 0542 02/27/20 1256 03/03/20 0858 05/27/20 1020 06/24/20 1036  NA  --   --  139 137   < > 137   < > 138 136 138  K  --   --  4.6 3.8   < > 4.4   < > 4.4 3.8 4.8  CL  --   --  104 104   < > 102   < > 105 107 106  CO2  --   --  26 23   < > 25   < > 22 21* 23  GLUCOSE  --   --  112* 119*   < > 101*   < > 100* 97 113*  BUN  --   --  21 23   < > 30*   < >  29* 25* 28*  CREATININE  --  1.90*  1.51* 1.58*   < > 1.77*   < > 1.85* 1.58* 1.82*  CALCIUM  --   --  9.0 8.7*   < > 9.1   < > 9.2 8.5* 8.9  GFRNONAA  --  31* 41* 39*   < > 34*   < > 35* 42* 36*  GFRAA  --  36* 48* 45*  --   --   --   --   --   --   PROT 7.7  --   --   --   --  7.5  --  7.7  --   --   ALBUMIN 4.4  --   --   --   --  4.3  --  4.4  --   --   AST 23  --   --   --   --  19  --  20  --   --   ALT 28  --   --   --   --  17  --  16  --   --   ALKPHOS 56  --   --   --   --  55  --  57  --   --   BILITOT 1.4*  --   --   --   --  0.9  --  1.1  --   --   BILIDIR 0.2  --   --   --   --   --   --   --   --   --   IBILI 1.2*  --   --   --   --   --   --   --   --   --    < > = values in this interval not displayed.   ASSESSMENT & PLAN:   Anemia of chronic kidney failure, stage 3 (moderate) ([place]) # Anemia-mild.  Since 2012.  Suspect MDS/chronic kidney disease.  No bone marrow biopsy; slowly worsening.  Discussed with the patient and son regarding the concerns for progressive anemia-question underlying MDS.  Recommend a bone marrow biopsy.  This was discussed previously.  Patient had declined.  However is more open to the idea of bone marrow biopsy at this time. Given significant discomfort involved-I would recommend under anesthesia/with radiology in the hospital. I discussed the potential complications include-bleeding/trauma and risk of infection; which are fortunately very rare.  Bone marrow biopsy/aspiration will be ordered after discussion with family.  # Hemoglobin 8.0  proceed with Aranesp today.  Continue p.o. iron.novl 2021- iron sat-20%.  Proceed with IV iron infusion.  # Mild intermittent thrombocytopenia-question ITP [nadir November 2020-80s].  Today platelets 250s ; ok to with asprin. STABLE.   #Recent acute CHF-2025% ejection fraction [[facility]]-see below ;on  Lasix 20 mg a day [half pill once a day]; STABLE.   #CKD stage III-GFR-33; STABLE.   #Left upper lobe cystic  lesion-[incidentally] CT scan- AUG 25th- 2021- CT scan-left upper lobe cystic lesion-4.5 cm-/slightly bigger by few mm- STABLE.   #Disposition: #  Proceed with Aranesp today # in 2 weeks- cbc possible aranesp # in 4 weeks MD-cbc/bmp; possible aranesp-Dr.B        Cammie Sickle, MD 06/24/2020 11:44 AM

## 2020-06-27 ENCOUNTER — Inpatient Hospital Stay: Payer: PPO

## 2020-06-27 VITALS — BP 127/66 | HR 78 | Temp 96.0°F | Resp 18

## 2020-06-27 DIAGNOSIS — I129 Hypertensive chronic kidney disease with stage 1 through stage 4 chronic kidney disease, or unspecified chronic kidney disease: Secondary | ICD-10-CM | POA: Diagnosis not present

## 2020-06-27 DIAGNOSIS — D631 Anemia in chronic kidney disease: Secondary | ICD-10-CM

## 2020-06-27 DIAGNOSIS — D500 Iron deficiency anemia secondary to blood loss (chronic): Secondary | ICD-10-CM

## 2020-06-27 MED ORDER — IRON SUCROSE 20 MG/ML IV SOLN
200.0000 mg | Freq: Once | INTRAVENOUS | Status: AC
  Administered 2020-06-27: 200 mg via INTRAVENOUS
  Filled 2020-06-27: qty 10

## 2020-06-27 MED ORDER — SODIUM CHLORIDE 0.9 % IV SOLN
Freq: Once | INTRAVENOUS | Status: AC
  Filled 2020-06-27: qty 250

## 2020-06-27 MED ORDER — SODIUM CHLORIDE 0.9 % IV SOLN: 200.0000 mg | Freq: Once | INTRAVENOUS | Status: DC

## 2020-06-27 NOTE — Progress Notes (Signed)
Pt reports feeling weak this morning and that he has not had a bowel movement since Thursday.  Beckey Rutter NP notified of these complaints.  Advised patient to take miralax, milk of magnesia, or magnesium citrate over the counter for his constipation.  Instructed to call our office should his weakness persist.  All vital signs stable, no other complaints.  Venofer administered per policy.  Pt tolerated infusion well and left the infusion suite stable and ambulatory with is walker.

## 2020-06-28 ENCOUNTER — Other Ambulatory Visit: Payer: Self-pay | Admitting: Internal Medicine

## 2020-06-28 DIAGNOSIS — D539 Nutritional anemia, unspecified: Secondary | ICD-10-CM

## 2020-06-29 ENCOUNTER — Telehealth: Payer: Self-pay | Admitting: Internal Medicine

## 2020-06-29 NOTE — Telephone Encounter (Signed)
Colette provided apts to patient's family

## 2020-06-29 NOTE — Telephone Encounter (Signed)
On 2/14-I called and spoke with patient's daughter [name] regarding the need for bone marrow biopsy.  In agreement.  Bone marrow biopsy ordered.

## 2020-06-29 NOTE — Telephone Encounter (Signed)
[name] aware of apts.

## 2020-06-29 NOTE — Telephone Encounter (Signed)
scheduled for CT BM Biopsy on Mon 07/04/20 at [facility]

## 2020-06-30 NOTE — Progress Notes (Signed)
Patient on schedule for BMB 07/04/2020. Called and spoke with Susan/daughter on phone with instructions given.Made aware to be here @ 0730, NPO after MN prior to day of procedure, and driver for discharge post procedure/recovery/home, stated understanding.

## 2020-07-01 ENCOUNTER — Other Ambulatory Visit: Payer: Self-pay | Admitting: Student

## 2020-07-04 ENCOUNTER — Other Ambulatory Visit: Payer: Self-pay

## 2020-07-04 ENCOUNTER — Ambulatory Visit
Admission: RE | Admit: 2020-07-04 | Discharge: 2020-07-04 | Disposition: A | Discharge status: Home / Self Care | Payer: PPO | Source: Ambulatory Visit | Attending: Internal Medicine | Admitting: Internal Medicine

## 2020-07-04 DIAGNOSIS — Z800 Family history of malignant neoplasm of digestive organs: Secondary | ICD-10-CM | POA: Insufficient documentation

## 2020-07-04 DIAGNOSIS — D649 Anemia, unspecified: Secondary | ICD-10-CM | POA: Diagnosis not present

## 2020-07-04 DIAGNOSIS — D469 Myelodysplastic syndrome, unspecified: Secondary | ICD-10-CM | POA: Insufficient documentation

## 2020-07-04 DIAGNOSIS — D72821 Monocytosis (symptomatic): Secondary | ICD-10-CM | POA: Insufficient documentation

## 2020-07-04 DIAGNOSIS — D539 Nutritional anemia, unspecified: Secondary | ICD-10-CM | POA: Diagnosis not present

## 2020-07-04 LAB — CBC WITH DIFFERENTIAL/PLATELET
Abs Immature Granulocytes: 0.08 10*3/uL — ABNORMAL HIGH (ref 0.00–0.07)
Basophils Absolute: 0 10*3/uL (ref 0.0–0.1)
Basophils Relative: 0 %
Eosinophils Absolute: 0.1 10*3/uL (ref 0.0–0.5)
Eosinophils Relative: 2 %
HCT: 25.2 % — ABNORMAL LOW (ref 39.0–52.0)
Hemoglobin: 8 g/dL — ABNORMAL LOW (ref 13.0–17.0)
Immature Granulocytes: 1 %
Lymphocytes Relative: 30 %
Lymphs Abs: 2.3 10*3/uL (ref 0.7–4.0)
MCH: 33.6 pg (ref 26.0–34.0)
MCHC: 31.7 g/dL (ref 30.0–36.0)
MCV: 105.9 fL — ABNORMAL HIGH (ref 80.0–100.0)
Monocytes Absolute: 2.7 10*3/uL — ABNORMAL HIGH (ref 0.1–1.0)
Monocytes Relative: 34 %
Neutro Abs: 2.6 10*3/uL (ref 1.7–7.7)
Neutrophils Relative %: 33 %
Platelets: 152 10*3/uL (ref 150–400)
RBC: 2.38 MIL/uL — ABNORMAL LOW (ref 4.22–5.81)
RDW: 18.4 % — ABNORMAL HIGH (ref 11.5–15.5)
Smear Review: NORMAL
WBC: 7.9 10*3/uL (ref 4.0–10.5)
nRBC: 0 % (ref 0.0–0.2)

## 2020-07-04 MED ORDER — FENTANYL CITRATE (PF) 100 MCG/2ML IJ SOLN
INTRAMUSCULAR | Status: AC
  Filled 2020-07-04: qty 2

## 2020-07-04 MED ORDER — HEPARIN SOD (PORK) LOCK FLUSH 100 UNIT/ML IV SOLN
INTRAVENOUS | Status: AC
  Filled 2020-07-04: qty 5

## 2020-07-04 MED ORDER — MIDAZOLAM HCL 2 MG/2ML IJ SOLN
INTRAMUSCULAR | Status: AC
  Filled 2020-07-04: qty 2

## 2020-07-04 MED ORDER — FENTANYL CITRATE (PF) 100 MCG/2ML IJ SOLN
INTRAMUSCULAR | Status: AC | PRN
  Administered 2020-07-04: 25 ug via INTRAVENOUS

## 2020-07-04 MED ORDER — SODIUM CHLORIDE 0.9 % IV SOLN: INTRAVENOUS | Status: DC

## 2020-07-04 MED ORDER — MIDAZOLAM HCL 2 MG/2ML IJ SOLN
INTRAMUSCULAR | Status: AC | PRN
  Administered 2020-07-04: 1 mg via INTRAVENOUS

## 2020-07-04 NOTE — Procedures (Signed)
Interventional Radiology Procedure:   Indications: Anemia  Procedure: CT guided bone marrow biopsy  Findings: 2 aspirates and 2 cores from right ilium  Complications: None     EBL: Minimal, less than 10 ml  Plan: Discharge to home in one hour.   Jaxton Casale R. Anselm Pancoast, MD  Pager: [phone]

## 2020-07-04 NOTE — Discharge Instructions (Signed)
Moderate Conscious Sedation, Adult, Care After This sheet gives you information about how to care for yourself after your procedure. Your health care provider may also give you more specific instructions. If you have problems or questions, contact your health care provider. What can I expect after the procedure? After the procedure, it is common to have:  Sleepiness for several hours.  Impaired judgment for several hours.  Difficulty with balance.  Vomiting if you eat too soon. Follow these instructions at home: For the time period you were told by your health care provider:  Rest.  Do not participate in activities where you could fall or become injured.  Do not drive or use machinery.  Do not drink alcohol.  Do not take sleeping pills or medicines that cause drowsiness.  Do not make important decisions or sign legal documents.  Do not take care of children on your own.      Eating and drinking  Follow the diet recommended by your health care provider.  Drink enough fluid to keep your urine pale yellow.  If you vomit: ? Drink water, juice, or soup when you can drink without vomiting. ? Make sure you have little or no nausea before eating solid foods.   General instructions  Take over-the-counter and prescription medicines only as told by your health care provider.  Have a responsible adult stay with you for the time you are told. It is important to have someone help care for you until you are awake and alert.  Do not smoke.  Keep all follow-up visits as told by your health care provider. This is important. Contact a health care provider if:  You are still sleepy or having trouble with balance after 24 hours.  You feel light-headed.  You keep feeling nauseous or you keep vomiting.  You develop a rash.  You have a fever.  You have redness or swelling around the IV site. Get help right away if:  You have trouble breathing.  You have new-onset confusion at  home. Summary  After the procedure, it is common to feel sleepy, have impaired judgment, or feel nauseous if you eat too soon.  Rest after you get home. Know the things you should not do after the procedure.  Follow the diet recommended by your health care provider and drink enough fluid to keep your urine pale yellow.  Get help right away if you have trouble breathing or new-onset confusion at home. This information is not intended to replace advice given to you by your health care provider. Make sure you discuss any questions you have with your health care provider. Document Revised: 08/28/2019 Document Reviewed: 03/26/2019 Elsevier Patient Education  2021 Elsevier Inc. Bone Marrow Aspiration and Bone Marrow Biopsy, Adult, Care After This sheet gives you information about how to care for yourself after your procedure. Your health care provider may also give you more specific instructions. If you have problems or questions, contact your health care provider. What can I expect after the procedure? After the procedure, it is common to have:  Mild pain and tenderness.  Swelling.  Bruising. Follow these instructions at home: Puncture site care  Follow instructions from your health care provider about how to take care of the puncture site. Make sure you: ? Wash your hands with soap and water before and after you change your bandage (dressing). If soap and water are not available, use hand sanitizer. ? Change your dressing as told by your health care provider.  Check   your puncture site every day for signs of infection. Check for: ? More redness, swelling, or pain. ? Fluid or blood. ? Warmth. ? Pus or a bad smell.   Activity  Return to your normal activities as told by your health care provider. Ask your health care provider what activities are safe for you.  Do not lift anything that is heavier than 10 lb (4.5 kg), or the limit that you are told, until your health care provider  says that it is safe.  Do not drive for 24 hours if you were given a sedative during your procedure. General instructions  Take over-the-counter and prescription medicines only as told by your health care provider.  Do not take baths, swim, or use a hot tub until your health care provider approves. Ask your health care provider if you may take showers. You may only be allowed to take sponge baths.  If directed, put ice on the affected area. To do this: ? Put ice in a plastic bag. ? Place a towel between your skin and the bag. ? Leave the ice on for 20 minutes, 2-3 times a day.  Keep all follow-up visits as told by your health care provider. This is important.   Contact a health care provider if:  Your pain is not controlled with medicine.  You have a fever.  You have more redness, swelling, or pain around the puncture site.  You have fluid or blood coming from the puncture site.  Your puncture site feels warm to the touch.  You have pus or a bad smell coming from the puncture site. Summary  After the procedure, it is common to have mild pain, tenderness, swelling, and bruising.  Follow instructions from your health care provider about how to take care of the puncture site and what activities are safe for you.  Take over-the-counter and prescription medicines only as told by your health care provider.  Contact a health care provider if you have any signs of infection, such as fluid or blood coming from the puncture site. This information is not intended to replace advice given to you by your health care provider. Make sure you discuss any questions you have with your health care provider. Document Revised: 09/16/2018 Document Reviewed: 09/16/2018 Elsevier Patient Education  2021 Elsevier Inc.  

## 2020-07-04 NOTE — Progress Notes (Signed)
Patient clinically stable post BMB per Dr Anselm Pancoast, tolerated well. Denies complaints post procedure. Awake/alert and oriented post procedure. Received Versed 1 mg along with Fentanyl 25 mcg IV for procedure. Report given to [facility] post procedure/specials.

## 2020-07-04 NOTE — H&P (Signed)
Chief Complaint: Patient was seen in consultation today for bone marrow biopsy  Referring Physician(s): Cammie Sickle  Supervising Physician: Markus Daft  Patient Status: [facility] - Out-pt  History of Present Illness: Andres Gutierrez is a 87 y.o. male being worked up for anemia. He is referred for bone marrow biopsy. PMHx, meds, labs, imaging, allergies reviewed. Feels well, no recent fevers, chills, illness. Has been NPO today as directed. Family at bedside.   Past Medical History:  Diagnosis Date  . Anemia   . Anginal pain ([place])   . Anxiety   . Cardiomyopathy ([place])   . Cerebral amyloid angiopathy ([place])   . CHF (congestive heart failure) ([place])   . Chronic kidney disease    kidney stones  . Coronary artery disease   . Cough   . Dyspnea   . GERD (gastroesophageal reflux disease)   . History of kidney stones   . Hypertension   . Lymphadenopathy, hilar   . MI (myocardial infarction) ([place])    x 2  . Wheezing     Past Surgical History:  Procedure Laterality Date  . BRONCHIAL NEEDLE ASPIRATION BIOPSY N/A 12/31/2014   Procedure: BRONCHIAL NEEDLE ASPIRATION BIOPSIES from carina;  Surgeon: Flora Lipps, MD;  Location: [facility] ORS;  Service: Cardiopulmonary;  Laterality: N/A;  . CARDIAC CATHETERIZATION N/A 12/28/2015   Procedure: Left Heart Cath and Cors/Grafts Angiography;  Surgeon: Yolonda Kida, MD;  Location: [place] [facility];  Service: Cardiovascular;  Laterality: N/A;  . CATARACT EXTRACTION W/PHACO Left 08/01/2016   Procedure: CATARACT EXTRACTION PHACO AND INTRAOCULAR LENS PLACEMENT ([place]) Left;  Surgeon: Leandrew Koyanagi, MD;  Location: [place];  Service: Ophthalmology;  Laterality: Left;  . CHOLECYSTECTOMY    . COLON SURGERY    . CORONARY ANGIOPLASTY WITH STENT PLACEMENT    . CORONARY ARTERY BYPASS GRAFT    . CYSTOSCOPY/URETEROSCOPY/HOLMIUM LASER/STENT PLACEMENT Left 03/18/2020   Procedure: CYSTOSCOPY/URETEROSCOPY/HOLMIUM LASER/STENT PLACEMENT;   Surgeon: Billey Co, MD;  Location: [facility] ORS;  Service: Urology;  Laterality: Left;  . ENDOBRONCHIAL ULTRASOUND N/A 12/31/2014   Procedure: ENDOBRONCHIAL ULTRASOUND;  Surgeon: Flora Lipps, MD;  Location: [facility] ORS;  Service: Cardiopulmonary;  Laterality: N/A;  . ESOPHAGOGASTRODUODENOSCOPY (EGD) WITH PROPOFOL N/A 06/20/2015   Procedure: ESOPHAGOGASTRODUODENOSCOPY (EGD) WITH PROPOFOL;  Surgeon: Lollie Sails, MD;  Location: [facility] ENDOSCOPY;  Service: Endoscopy;  Laterality: N/A;  . IR CATHETER TUBE CHANGE  10/26/2019  . IR CHOLANGIOGRAM EXISTING TUBE  08/13/2019    Allergies: Levaquin [levofloxacin], Escitalopram, 5ht3 receptor antagonists, Diltiazem, Diphenhydramine hcl, Maxidex [dexamethasone], Prednisone, Serotonin, Vytorin [ezetimibe-simvastatin], and Zocor [simvastatin]  Medications: Prior to Admission medications   Medication Sig Start Date End Date Taking? Authorizing Provider  Ascorbic Acid (VITAMIN C PO) Take 1 tablet by mouth daily.   Yes [provider]  aspirin 81 MG EC tablet Take 1 tablet (81 mg total) by mouth daily. RESUME 48HRS after surgery Patient taking differently: Take 81 mg by mouth daily. 11/06/19  Yes Sakai, Isami, DO  atorvastatin (LIPITOR) 40 MG tablet Take 1 tablet (40 mg total) by mouth daily with supper. 02/27/20  Yes Enzo Bi, MD  digoxin (LANOXIN) 0.125 MG tablet Take 0.0625 mg by mouth daily.   Yes [provider]  famotidine (PEPCID) 20 MG tablet Take 20 mg by mouth at bedtime. 02/04/19  Yes [provider]  feeding supplement (ENSURE ENLIVE / ENSURE PLUS) LIQD Take 237 mLs by mouth 2 (two) times daily between meals. 03/04/20  Yes Lavina Hamman, MD  ferrous sulfate 325 (65 FE) MG tablet Take 1 tablet (325 mg total) by mouth every Monday, Wednesday, and Friday. With supper 02/29/20  Yes Lai, Tina, MD  furosemide (LASIX) 20 MG tablet Take 1 tablet (20 mg total) by mouth daily as needed (for weight gain of 3lbs in 1 day or 5 lbs in 2  days or shortness of breath or leg swelling). 03/04/20  Yes Patel, Pranav M, MD  metoprolol tartrate (LOPRESSOR) 25 MG tablet Take 12.5 mg by mouth at bedtime.   Yes [provider]  pantoprazole (PROTONIX) 20 MG tablet Take 20 mg by mouth daily before breakfast.   Yes [provider]  PRESCRIPTION MEDICATION    Yes [provider]  ranolazine (RANEXA) 1000 MG SR tablet Take 500 mg by mouth 2 (two) times daily.  01/16/19  Yes [provider]  tamsulosin (FLOMAX) 0.4 MG CAPS capsule Take 0.4 mg by mouth daily after supper.    Yes [provider]  vitamin B-12 (CYANOCOBALAMIN) 500 MCG tablet Take 500 mcg by mouth daily.   Yes [provider]  Multiple Vitamin (MULTIVITAMIN) tablet Take 1 tablet by mouth daily.     [provider]  neomycin-bacitracin-polymyxin (NEOSPORIN) ointment Apply 1 application topically every other day. Wound care (foot area)    [provider]  sucralfate (CARAFATE) 1 G tablet Take 1 g by mouth 2 (two) times daily.     [provider]     Family History  Problem Relation Age of Onset  . CAD Mother   . Colon cancer Father     Social History   Socioeconomic History  . Marital status: Widowed    Spouse name: Not on file  . Number of children: 2  . Years of education: college  . Highest education level: Some college, no degree  Occupational History  . Occupation: retired  Tobacco Use  . Smoking status: Former Smoker    Quit date: 06/27/1977    Years since quitting: 43.0  . Smokeless tobacco: Current User    Types: Chew  Vaping Use  . Vaping Use: Never used  Substance and Sexual Activity  . Alcohol use: Yes    Comment: occational almost rare once a year  . Drug use: No  . Sexual activity: Not Currently    Birth control/protection: Abstinence  Other Topics Concern  . Not on file  Social History Narrative  . Not on file   Social Determinants of Health   Financial Resource  Strain: Not on file  Food Insecurity: Not on file  Transportation Needs: Not on file  Physical Activity: Not on file  Stress: Not on file  Social Connections: Not on file    Review of Systems: A 12 point ROS discussed and pertinent positives are indicated in the HPI above.  All other systems are negative.  Review of Systems  Vital Signs: Pulse 77   Temp 98.4 F (36.9 C) (Oral)   Resp 20   Ht 6' 4" (1.93 m)   Wt 83.5 kg   SpO2 96%   BMI 22.40 kg/m   Physical Exam Constitutional:      Appearance: Normal appearance.  HENT:     Mouth/Throat:     Mouth: Mucous membranes are moist.     Pharynx: Oropharynx is clear.  Cardiovascular:     Rate and Rhythm: Normal rate and regular rhythm.     Heart sounds: Normal heart sounds.  Pulmonary:     Effort: Pulmonary effort   is normal. No respiratory distress.     Breath sounds: Normal breath sounds.  Skin:    General: Skin is warm and dry.  Neurological:     General: No focal deficit present.     Mental Status: He is alert and oriented to person, place, and time.  Psychiatric:        Mood and Affect: Mood normal.        Thought Content: Thought content normal.        Judgment: Judgment normal.     Imaging: No results found.  Labs:  CBC: Recent Labs    04/21/20 1026 05/27/20 1020 06/13/20 1103 06/24/20 1036  WBC 8.4 11.4* 8.9 9.2  HGB 9.4* 8.8* 8.0* 8.0*  HCT 29.0* 26.7* 25.1* 25.4*  PLT 132* 104* 226 224    COAGS: Recent Labs    07/30/19 0942 03/03/20 1728  INR 1.4* 1.1  APTT 55* 40*    BMP: Recent Labs    10/21/19 0924 11/06/19 1429 11/11/19 1104 01/07/20 0916 02/18/20 1036 02/27/20 1256 03/03/20 0858 05/27/20 1020 06/24/20 1036  NA 138  --  139 137   < > 136 138 136 138  K 3.6  --  4.6 3.8   < > 3.8 4.4 3.8 4.8  CL 102  --  104 104   < > 102 105 107 106  CO2 22  --  26 23   < > 25 22 21* 23  GLUCOSE 133*  --  112* 119*   < > 117* 100* 97 113*  BUN 35*  --  21 23   < > 23 29* 25* 28*   CALCIUM 9.3  --  9.0 8.7*   < > 9.2 9.2 8.5* 8.9  CREATININE 1.97* 1.90* 1.51* 1.58*   < > 1.51* 1.85* 1.58* 1.82*  GFRNONAA 30* 31* 41* 39*   < > 41* 35* 42* 36*  GFRAA 35* 36* 48* 45*  --   --   --   --   --    < > = values in this interval not displayed.    LIVER FUNCTION TESTS: Recent Labs    07/29/19 2314 07/31/19 0413 10/21/19 1231 02/26/20 0542 03/03/20 0858  BILITOT 1.9*  --  1.4* 0.9 1.1  AST 36  --  23 19 20  ALT 18  --  28 17 16  ALKPHOS 55  --  56 55 57  PROT 6.8  --  7.7 7.5 7.7  ALBUMIN 3.8 3.1* 4.4 4.3 4.4    TUMOR MARKERS: No results for input(s): AFPTM, CEA, CA199, CHROMGRNA in the last 8760 hours.  Assessment and Plan: Anemia For CT guided bone marrow biopsy Risks and benefits of bone marrow biopsy was discussed with the patient and/or patient's family including, but not limited to bleeding, infection, damage to adjacent structures or low yield requiring additional tests.  All of the questions were answered and there is agreement to proceed.  Consent signed and in chart.    Thank you for this interesting consult.  I greatly enjoyed meeting Jaevin H Pensyl and look forward to participating in their care.  A copy of this report was sent to the requesting provider on this date.  Electronically Signed:  , PA-C 07/04/2020, 8:26 AM   I spent a total of 20 minutes in face to face in clinical consultation, greater than 50% of which was counseling/coordinating care for BM bx  

## 2020-07-06 LAB — SURGICAL PATHOLOGY

## 2020-07-08 ENCOUNTER — Inpatient Hospital Stay: Payer: PPO

## 2020-07-08 VITALS — BP 118/60 | HR 74

## 2020-07-08 DIAGNOSIS — N183 Chronic kidney disease, stage 3 unspecified: Secondary | ICD-10-CM

## 2020-07-08 DIAGNOSIS — I129 Hypertensive chronic kidney disease with stage 1 through stage 4 chronic kidney disease, or unspecified chronic kidney disease: Secondary | ICD-10-CM | POA: Diagnosis not present

## 2020-07-08 DIAGNOSIS — D500 Iron deficiency anemia secondary to blood loss (chronic): Secondary | ICD-10-CM

## 2020-07-08 DIAGNOSIS — D631 Anemia in chronic kidney disease: Secondary | ICD-10-CM

## 2020-07-08 LAB — CBC WITH DIFFERENTIAL/PLATELET
Abs Immature Granulocytes: 0 10*3/uL (ref 0.00–0.07)
Basophils Absolute: 0.1 10*3/uL (ref 0.0–0.1)
Basophils Relative: 1 %
Eosinophils Absolute: 0.3 10*3/uL (ref 0.0–0.5)
Eosinophils Relative: 4 %
HCT: 28.1 % — ABNORMAL LOW (ref 39.0–52.0)
Hemoglobin: 8.7 g/dL — ABNORMAL LOW (ref 13.0–17.0)
Lymphocytes Relative: 47 %
Lymphs Abs: 3.1 10*3/uL (ref 0.7–4.0)
MCH: 33.2 pg (ref 26.0–34.0)
MCHC: 31 g/dL (ref 30.0–36.0)
MCV: 107.3 fL — ABNORMAL HIGH (ref 80.0–100.0)
Monocytes Absolute: 1.2 10*3/uL — ABNORMAL HIGH (ref 0.1–1.0)
Monocytes Relative: 18 %
Neutro Abs: 2 10*3/uL (ref 1.7–7.7)
Neutrophils Relative %: 30 %
Platelets: 170 10*3/uL (ref 150–400)
RBC: 2.62 MIL/uL — ABNORMAL LOW (ref 4.22–5.81)
RDW: 17.6 % — ABNORMAL HIGH (ref 11.5–15.5)
Smear Review: ADEQUATE
WBC: 6.7 10*3/uL (ref 4.0–10.5)
nRBC: 0 % (ref 0.0–0.2)

## 2020-07-08 MED ORDER — DARBEPOETIN ALFA 300 MCG/0.6ML IJ SOSY
300.0000 ug | PREFILLED_SYRINGE | Freq: Once | INTRAMUSCULAR | Status: AC
  Administered 2020-07-08: 300 ug via SUBCUTANEOUS
  Filled 2020-07-08: qty 0.6

## 2020-07-12 ENCOUNTER — Encounter (HOSPITAL_COMMUNITY): Payer: Self-pay | Admitting: Internal Medicine

## 2020-07-14 ENCOUNTER — Encounter (HOSPITAL_COMMUNITY): Payer: Self-pay | Admitting: Internal Medicine

## 2020-07-18 ENCOUNTER — Telehealth: Payer: Self-pay | Admitting: Internal Medicine

## 2020-07-18 ENCOUNTER — Emergency Department: Payer: PPO

## 2020-07-18 ENCOUNTER — Telehealth: Payer: Self-pay | Admitting: *Deleted

## 2020-07-18 ENCOUNTER — Emergency Department
Admission: EM | Admit: 2020-07-18 | Discharge: 2020-07-18 | Disposition: A | Discharge status: Home / Self Care | Payer: PPO | Attending: Emergency Medicine | Admitting: Emergency Medicine

## 2020-07-18 ENCOUNTER — Encounter: Payer: Self-pay | Admitting: Emergency Medicine

## 2020-07-18 ENCOUNTER — Other Ambulatory Visit: Payer: Self-pay

## 2020-07-18 DIAGNOSIS — I130 Hypertensive heart and chronic kidney disease with heart failure and stage 1 through stage 4 chronic kidney disease, or unspecified chronic kidney disease: Secondary | ICD-10-CM | POA: Diagnosis not present

## 2020-07-18 DIAGNOSIS — R519 Headache, unspecified: Secondary | ICD-10-CM | POA: Insufficient documentation

## 2020-07-18 DIAGNOSIS — N1831 Chronic kidney disease, stage 3a: Secondary | ICD-10-CM | POA: Diagnosis not present

## 2020-07-18 DIAGNOSIS — Z79899 Other long term (current) drug therapy: Secondary | ICD-10-CM | POA: Insufficient documentation

## 2020-07-18 DIAGNOSIS — Z87891 Personal history of nicotine dependence: Secondary | ICD-10-CM | POA: Diagnosis not present

## 2020-07-18 DIAGNOSIS — I2510 Atherosclerotic heart disease of native coronary artery without angina pectoris: Secondary | ICD-10-CM | POA: Diagnosis not present

## 2020-07-18 DIAGNOSIS — G4489 Other headache syndrome: Secondary | ICD-10-CM | POA: Diagnosis not present

## 2020-07-18 DIAGNOSIS — L97521 Non-pressure chronic ulcer of other part of left foot limited to breakdown of skin: Secondary | ICD-10-CM | POA: Diagnosis not present

## 2020-07-18 DIAGNOSIS — Z7982 Long term (current) use of aspirin: Secondary | ICD-10-CM | POA: Diagnosis not present

## 2020-07-18 DIAGNOSIS — R062 Wheezing: Secondary | ICD-10-CM | POA: Insufficient documentation

## 2020-07-18 DIAGNOSIS — I5022 Chronic systolic (congestive) heart failure: Secondary | ICD-10-CM | POA: Diagnosis not present

## 2020-07-18 DIAGNOSIS — Z955 Presence of coronary angioplasty implant and graft: Secondary | ICD-10-CM | POA: Insufficient documentation

## 2020-07-18 DIAGNOSIS — G609 Hereditary and idiopathic neuropathy, unspecified: Secondary | ICD-10-CM | POA: Diagnosis not present

## 2020-07-18 DIAGNOSIS — J90 Pleural effusion, not elsewhere classified: Secondary | ICD-10-CM | POA: Diagnosis not present

## 2020-07-18 DIAGNOSIS — D631 Anemia in chronic kidney disease: Secondary | ICD-10-CM | POA: Diagnosis not present

## 2020-07-18 DIAGNOSIS — R29810 Facial weakness: Secondary | ICD-10-CM | POA: Diagnosis not present

## 2020-07-18 DIAGNOSIS — R531 Weakness: Secondary | ICD-10-CM | POA: Diagnosis not present

## 2020-07-18 DIAGNOSIS — R202 Paresthesia of skin: Secondary | ICD-10-CM | POA: Diagnosis not present

## 2020-07-18 DIAGNOSIS — R200 Anesthesia of skin: Secondary | ICD-10-CM | POA: Diagnosis not present

## 2020-07-18 LAB — COMPREHENSIVE METABOLIC PANEL
ALT: 13 U/L (ref 0–44)
AST: 22 U/L (ref 15–41)
Albumin: 4.4 g/dL (ref 3.5–5.0)
Alkaline Phosphatase: 60 U/L (ref 38–126)
Anion gap: 11 (ref 5–15)
BUN: 25 mg/dL — ABNORMAL HIGH (ref 8–23)
CO2: 20 mmol/L — ABNORMAL LOW (ref 22–32)
Calcium: 9.2 mg/dL (ref 8.9–10.3)
Chloride: 103 mmol/L (ref 98–111)
Creatinine, Ser: 1.73 mg/dL — ABNORMAL HIGH (ref 0.61–1.24)
GFR, Estimated: 38 mL/min — ABNORMAL LOW (ref >60)
Glucose, Bld: 97 mg/dL (ref 70–99)
Potassium: 4.2 mmol/L (ref 3.5–5.1)
Sodium: 134 mmol/L — ABNORMAL LOW (ref 135–145)
Total Bilirubin: 1 mg/dL (ref 0.3–1.2)
Total Protein: 7.3 g/dL (ref 6.5–8.1)

## 2020-07-18 LAB — CBC
HCT: 29 % — ABNORMAL LOW (ref 39.0–52.0)
Hemoglobin: 9.2 g/dL — ABNORMAL LOW (ref 13.0–17.0)
MCH: 33.1 pg (ref 26.0–34.0)
MCHC: 31.7 g/dL (ref 30.0–36.0)
MCV: 104.3 fL — ABNORMAL HIGH (ref 80.0–100.0)
Platelets: 155 10*3/uL (ref 150–400)
RBC: 2.78 MIL/uL — ABNORMAL LOW (ref 4.22–5.81)
RDW: 17.7 % — ABNORMAL HIGH (ref 11.5–15.5)
WBC: 7.1 10*3/uL (ref 4.0–10.5)
nRBC: 0 % (ref 0.0–0.2)

## 2020-07-18 LAB — DIFFERENTIAL
Abs Immature Granulocytes: 0.05 10*3/uL (ref 0.00–0.07)
Basophils Absolute: 0 10*3/uL (ref 0.0–0.1)
Basophils Relative: 0 %
Eosinophils Absolute: 0.1 10*3/uL (ref 0.0–0.5)
Eosinophils Relative: 1 %
Immature Granulocytes: 1 %
Lymphocytes Relative: 30 %
Lymphs Abs: 2.1 10*3/uL (ref 0.7–4.0)
Monocytes Absolute: 1.9 10*3/uL — ABNORMAL HIGH (ref 0.1–1.0)
Monocytes Relative: 27 %
Neutro Abs: 2.9 10*3/uL (ref 1.7–7.7)
Neutrophils Relative %: 41 %
Smear Review: NORMAL

## 2020-07-18 LAB — CBG MONITORING, ED: Glucose-Capillary: 93 mg/dL (ref 70–99)

## 2020-07-18 LAB — PROTIME-INR
INR: 1.2 (ref 0.8–1.2)
Prothrombin Time: 14.4 seconds (ref 11.4–15.2)

## 2020-07-18 LAB — APTT: aPTT: 40 seconds — ABNORMAL HIGH (ref 24–36)

## 2020-07-18 NOTE — Telephone Encounter (Signed)
Daughter [name] called asking for Dr B to please call her with results of the BMBx done 07/04/20

## 2020-07-18 NOTE — Telephone Encounter (Signed)
Pt's daughter called requesting results from pt's biopsy. She stated that pt is currently in the emergency room. RN notified.

## 2020-07-18 NOTE — ED Notes (Signed)
Pt agreeable with d/c plan as discussed by Dr Archie Balboa- this nurse has verbally reinforced d/c instructions with patient and daughter, at bedside, -- pt acknowledges verbal understanding and denies any additional questions, concerns, needs-- Escorted to vehicle via w/c

## 2020-07-18 NOTE — ED Triage Notes (Addendum)
Pt via EMS from home. Pt c/o L arm and hand numbness since 0400 this morning. Denies any numbness in his legs or face. Pt states he was having headache this morning. Pt has a hx of stroke in the past. No facial palsy noted. Pt requesting chest x-ray because he has felt SOB at time and he states that recently he has a high pitch wheeze when he exhales heavily. Denies pain right now. Pt is A&Ox4 and NAD.

## 2020-07-18 NOTE — Telephone Encounter (Signed)
Dr. Jacinto Reap - Daughter is requesting a call back from you personally to discuss bone marrow biopsy results. Patient currently in the ER with severe headache symptoms.

## 2020-07-18 NOTE — ED Triage Notes (Signed)
Pt comes into the ED via EMS from home with c/o temporal HA, hx of spontaneous bleed last yesterday  60'sHR 130/62 99%RA

## 2020-07-18 NOTE — ED Provider Notes (Signed)
[facility] Emergency Department Provider Note  ____________________________________________   I have reviewed the triage vital signs and the nursing notes.   HISTORY  Chief Complaint Numbness and Headache   History limited by: Not Limited   HPI Andres Gutierrez is a 87 y.o. male who presents to the emergency department today because of concern for an episode of weakness and pain to his right arm. The patient states that he noticed it upon awakening at roughly 4 am this morning. He felt like he couldn't move his right arm. He also had pain in the fingers of his right arm. The patient says that these symptoms lasted for roughly 10 minutes before going away. He was able to go to a podiatry appointment today, however afterwards started developing a headache. Located behind his forehead. Denies frequent headaches. By the time of my exam the headache had significantly improved. Also has complaint of wheezing with forced exhalation.    Records reviewed. Per medical record review patient has a history of CHF, CAD, HTN.   Past Medical History:  Diagnosis Date  . Anemia   . Anginal pain ([place])   . Anxiety   . Cardiomyopathy ([place])   . Cerebral amyloid angiopathy ([place])   . CHF (congestive heart failure) ([place])   . Chronic kidney disease    kidney stones  . Coronary artery disease   . Cough   . Dyspnea   . GERD (gastroesophageal reflux disease)   . History of kidney stones   . Hypertension   . Lymphadenopathy, hilar   . MI (myocardial infarction) ([place])    x 2  . Wheezing     Patient Active Problem List   Diagnosis Date Noted  . Cerebral amyloid angiopathy ([place]) 03/05/2020  . Abdominal pain 03/03/2020  . HLD (hyperlipidemia) 03/03/2020  . Blurry vision 03/03/2020  . CKD (chronic kidney disease), stage IIIa 03/03/2020  . Left ureteral stone 03/03/2020  . Generalized weakness   . Cerebral hemorrhage, nontraumatic (HCC) 03/02/2020  . Chronic cholecystitis  11/06/2019  . Malnutrition of moderate degree 07/30/2019  . Acute neutrophilia 07/30/2019  . Sepsis ([place]) 07/29/2019  . Acute cholecystitis 07/29/2019  . Foot infection   . Acute on chronic heart failure ([place]) 06/07/2019  . Weakness   . NSTEMI (non-ST elevated myocardial infarction) ([place])   . Orthostatic hypotension 01/07/2019  . Macrocytic anemia 08/05/2018  . Anemia of chronic kidney failure, stage 3 (moderate) ([place]) 08/05/2018  . BPH (benign prostatic hyperplasia) 07/24/2018  . Gastroesophageal reflux disease with esophagitis 07/24/2018  . Stage 3 chronic kidney disease ([place]) 07/24/2018  . Thrombocytopenia ([place]) 07/24/2018  . Primary osteoarthritis of both knees 08/22/2017  . Medicare annual wellness visit, initial 11/27/2016  . Chronic systolic heart failure ([place]) 01/17/2016  . Chewing tobacco use 01/17/2016  . Atrial fibrillation with RVR ([place]) 12/23/2015  . Dyspnea on exertion 12/23/2015  . Elevated troponin 12/23/2015  . GERD (gastroesophageal reflux disease) 12/23/2015  . Iron deficiency anemia due to chronic blood loss 12/20/2015  . Femoral neck fracture, left, closed, initial encounter 11/03/2015  . Chronic atrial fibrillation ([place])   . Coronary artery disease involving native coronary artery of native heart without angina pectoris   . Acalculous cholecystitis 10/29/2015  . B12 deficiency 08/03/2015  . Erosive esophagitis 08/03/2015  . Adenopathy   . CAD (coronary artery disease) 12/22/2014  . HTN (hypertension) 12/22/2014  . Cardiomyopathy, ischemic 07/27/2014    Past Surgical History:  Procedure Laterality Date  . BRONCHIAL NEEDLE  ASPIRATION BIOPSY N/A 12/31/2014   Procedure: BRONCHIAL NEEDLE ASPIRATION BIOPSIES from carina;  Surgeon: Flora Lipps, MD;  Location: [facility] ORS;  Service: Cardiopulmonary;  Laterality: N/A;  . CARDIAC CATHETERIZATION N/A 12/28/2015   Procedure: Left Heart Cath and Cors/Grafts Angiography;  Surgeon: Yolonda Kida, MD;  Location: [place] [facility];  Service: Cardiovascular;  Laterality: N/A;  . CATARACT EXTRACTION W/PHACO Left 08/01/2016   Procedure: CATARACT EXTRACTION PHACO AND INTRAOCULAR LENS PLACEMENT ([place]) Left;  Surgeon: Leandrew Koyanagi, MD;  Location: [place];  Service: Ophthalmology;  Laterality: Left;  . CHOLECYSTECTOMY    . COLON SURGERY    . CORONARY ANGIOPLASTY WITH STENT PLACEMENT    . CORONARY ARTERY BYPASS GRAFT    . CYSTOSCOPY/URETEROSCOPY/HOLMIUM LASER/STENT PLACEMENT Left 03/18/2020   Procedure: CYSTOSCOPY/URETEROSCOPY/HOLMIUM LASER/STENT PLACEMENT;  Surgeon: Billey Co, MD;  Location: [facility] ORS;  Service: Urology;  Laterality: Left;  . ENDOBRONCHIAL ULTRASOUND N/A 12/31/2014   Procedure: ENDOBRONCHIAL ULTRASOUND;  Surgeon: Flora Lipps, MD;  Location: [facility] ORS;  Service: Cardiopulmonary;  Laterality: N/A;  . ESOPHAGOGASTRODUODENOSCOPY (EGD) WITH PROPOFOL N/A 06/20/2015   Procedure: ESOPHAGOGASTRODUODENOSCOPY (EGD) WITH PROPOFOL;  Surgeon: Lollie Sails, MD;  Location: [facility] ENDOSCOPY;  Service: Endoscopy;  Laterality: N/A;  . IR CATHETER TUBE CHANGE  10/26/2019  . IR CHOLANGIOGRAM EXISTING TUBE  08/13/2019    Prior to Admission medications   Medication Sig Start Date End Date Taking? Authorizing Provider  Ascorbic Acid (VITAMIN C PO) Take 1 tablet by mouth daily.    [provider]  aspirin 81 MG EC tablet Take 1 tablet (81 mg total) by mouth daily. RESUME 48HRS after surgery Patient taking differently: Take 81 mg by mouth daily. 11/06/19   Lysle Pearl, Isami, DO  atorvastatin (LIPITOR) 40 MG tablet Take 1 tablet (40 mg total) by mouth daily with supper. 02/27/20   Enzo Bi, MD  digoxin (LANOXIN) 0.125 MG tablet Take 0.0625 mg by mouth daily.    [provider]  famotidine (PEPCID) 20 MG tablet Take 20 mg by mouth at bedtime. 02/04/19   [provider]  feeding supplement (ENSURE ENLIVE / ENSURE PLUS) LIQD Take 237 mLs by mouth 2 (two) times daily between meals.  03/04/20   Lavina Hamman, MD  ferrous sulfate 325 (65 FE) MG tablet Take 1 tablet (325 mg total) by mouth every Monday, Wednesday, and Friday. With supper 02/29/20   Enzo Bi, MD  furosemide (LASIX) 20 MG tablet Take 1 tablet (20 mg total) by mouth daily as needed (for weight gain of 3lbs in 1 day or 5 lbs in 2 days or shortness of breath or leg swelling). 03/04/20   Lavina Hamman, MD  metoprolol tartrate (LOPRESSOR) 25 MG tablet Take 12.5 mg by mouth at bedtime.    [provider]  Multiple Vitamin (MULTIVITAMIN) tablet Take 1 tablet by mouth daily.     [provider]  neomycin-bacitracin-polymyxin (NEOSPORIN) ointment Apply 1 application topically every other day. Wound care (foot area)    [provider]  pantoprazole (PROTONIX) 20 MG tablet Take 20 mg by mouth daily before breakfast.    [provider]  PRESCRIPTION MEDICATION     [provider]  ranolazine (RANEXA) 1000 MG SR tablet Take 500 mg by mouth 2 (two) times daily.  01/16/19   [provider]  sucralfate (CARAFATE) 1 G tablet Take 1 g by mouth 2 (two) times daily.     [provider]  tamsulosin (FLOMAX) 0.4 MG CAPS capsule  Take 0.4 mg by mouth daily after supper.     [provider]  vitamin B-12 (CYANOCOBALAMIN) 500 MCG tablet Take 500 mcg by mouth daily.    [provider]    Allergies Levaquin [levofloxacin], Escitalopram, 5ht3 receptor antagonists, Diltiazem, Diphenhydramine hcl, Maxidex [dexamethasone], Prednisone, Serotonin, Vytorin [ezetimibe-simvastatin], and Zocor [simvastatin]  Family History  Problem Relation Age of Onset  . CAD Mother   . Colon cancer Father     Social History Social History   Tobacco Use  . Smoking status: Former Smoker    Quit date: 06/27/1977    Years since quitting: 43.0  . Smokeless tobacco: Current User    Types: Chew  Vaping Use  . Vaping Use: Never used  Substance Use Topics  . Alcohol use: Yes     Comment: occational almost rare once a year  . Drug use: No    Review of Systems Constitutional: No fever/chills Eyes: No visual changes. ENT: No sore throat. Cardiovascular: Denies chest pain. Respiratory: Positive for wheezing with forced exhalation.  Gastrointestinal: No abdominal pain.  No nausea, no vomiting.  No diarrhea.   Genitourinary: Negative for dysuria. Musculoskeletal: Negative for back pain. Skin: Negative for rash. Neurological: Positive for headache. Episode of right arm weakness and pain. ____________________________________________   PHYSICAL EXAM:  VITAL SIGNS: ED Triage Vitals  Enc Vitals Group     BP 07/18/20 1454 127/65     Pulse Rate 07/18/20 1454 66     Resp 07/18/20 1456 20     Temp 07/18/20 1454 97.7 F (36.5 C)     Temp Source 07/18/20 1454 Oral     SpO2 07/18/20 1454 100 %     Weight 07/18/20 1456 181 lb (82.1 kg)     Height 07/18/20 1456 6\' 4"  (1.93 m)     Head Circumference --      Peak Flow --      Pain Score 07/18/20 1456 0   Constitutional: Alert and oriented.  Eyes: Conjunctivae are normal.  ENT      Head: Normocephalic and atraumatic.      Nose: No congestion/rhinnorhea.      Mouth/Throat: Mucous membranes are moist.      Neck: No stridor. Hematological/Lymphatic/Immunilogical: No cervical lymphadenopathy. Cardiovascular: Normal rate, regular rhythm.  No murmurs, rubs, or gallops.  Respiratory: Normal respiratory effort without tachypnea nor retractions. Breath sounds are clear and equal bilaterally. No wheezes/rales/rhonchi. Gastrointestinal: Soft and non tender. No rebound. No guarding.  Genitourinary: Deferred Musculoskeletal: Normal range of motion in all extremities. No lower extremity edema. Neurologic:  Normal speech and language. EOMI. PERRL. Face symmetric. Strength 5/5 in upper and lower extremities. Sensation intact. No gross focal neurologic deficits are appreciated.  Skin:  Skin is warm, dry and intact. No rash  noted. Psychiatric: Mood and affect are normal. Speech and behavior are normal. Patient exhibits appropriate insight and judgment.  ____________________________________________    LABS (pertinent positives/negatives)  CMP na 134, k 4.2, cr 1.73 CBC wbc 7.1, hgb 9.2, plt 155   ____________________________________________   EKG  I, Nance Pear, attending physician, personally viewed and interpreted this EKG  EKG Time: 1459 Rate: 65 Rhythm: sinus rhythm with 1st degree av block and PAC Axis: right axis deviation Intervals: qtc 461 QRS: LVH ST changes: no st elevation Impression: abnormal ekg ____________________________________________    RADIOLOGY  CXR Mild bibasilar atelectasis vs infiltrate. Small pleural effusions.  CT head No acute intracranial abnormality ____________________________________________   PROCEDURES  Procedures  ____________________________________________  INITIAL IMPRESSION / ASSESSMENT AND PLAN / ED COURSE  Pertinent labs & imaging results that were available during my care of the patient were reviewed by me and considered in my medical decision making (see chart for details).   Patient presented to the emergency department today because of concerns for right arm weakness, pain as well as headache.  At the time my exam headache had improved in the right arm issues had resolved within 10 minutes of them starting earlier this morning.  Patient also had concern for possible wheezing with deep exhalation.  On exam his lungs were clear to auscultation he did not have any neurologic deficits.  CT head without any acute findings.  At this point I do doubt TIA.  Certainly his symptoms seem to be more akin to paresthesias.  I do wonder if he might have slept on his arm wrong.  Did discuss reassuring work-up here in emergency department.  Did discuss return precautions.  He has follow-up appointment already scheduled with cancer center later this  week.  ____________________________________________   FINAL CLINICAL IMPRESSION(S) / ED DIAGNOSES  Final diagnoses:  Paresthesias     Note: This dictation was prepared with Dragon dictation. Any transcriptional errors that result from this process are unintentional     Nance Pear, MD 07/18/20 1943

## 2020-07-18 NOTE — Telephone Encounter (Signed)
On 03/07-I spoke to patient's daughter Andres Gutierrez regarding the results of the bone marrow suggestive of myelodysplastic syndrome; low-grade.  Continue growth factor support for now.  Recommend follow-up in the clinic as planned later in the week. Will consider [facility] one hem after discussion with pt/family.  GB

## 2020-07-18 NOTE — Discharge Instructions (Addendum)
Please seek medical attention for any high fevers, chest pain, shortness of breath, change in behavior, persistent vomiting, bloody stool or any other new or concerning symptoms.  

## 2020-07-18 NOTE — ED Notes (Signed)
Pt awake and alert; speech clear-- denies any complaints at this time- states HA, LUE numbness and sob resolved at this time.  RR even and unlabored on RA- lung sounds CTA - O2 sats 99%.  S1 and S2 regular upon auscultation - HR 68.  Abdomen soft nontender- BS normactive x4.  Skin warm dry and intact.  Ortho shoe noted on L foot.  Pt denies any questions, concerns needs at this time.  Will monitor for acute changes and maintain plan of care.

## 2020-07-19 NOTE — Telephone Encounter (Signed)
Fyi-Dr. B - I called [facility] this afternoon to order new kits. We did not have any in stock.

## 2020-07-22 ENCOUNTER — Encounter: Payer: Self-pay | Admitting: Internal Medicine

## 2020-07-22 ENCOUNTER — Other Ambulatory Visit: Payer: Self-pay

## 2020-07-22 ENCOUNTER — Inpatient Hospital Stay: Payer: PPO

## 2020-07-22 ENCOUNTER — Inpatient Hospital Stay (HOSPITAL_BASED_OUTPATIENT_CLINIC_OR_DEPARTMENT_OTHER): Payer: PPO | Admitting: Internal Medicine

## 2020-07-22 ENCOUNTER — Inpatient Hospital Stay: Payer: PPO | Attending: Internal Medicine

## 2020-07-22 DIAGNOSIS — N183 Chronic kidney disease, stage 3 unspecified: Secondary | ICD-10-CM

## 2020-07-22 DIAGNOSIS — R0602 Shortness of breath: Secondary | ICD-10-CM | POA: Insufficient documentation

## 2020-07-22 DIAGNOSIS — I429 Cardiomyopathy, unspecified: Secondary | ICD-10-CM | POA: Insufficient documentation

## 2020-07-22 DIAGNOSIS — Z7982 Long term (current) use of aspirin: Secondary | ICD-10-CM | POA: Diagnosis not present

## 2020-07-22 DIAGNOSIS — I130 Hypertensive heart and chronic kidney disease with heart failure and stage 1 through stage 4 chronic kidney disease, or unspecified chronic kidney disease: Secondary | ICD-10-CM | POA: Diagnosis not present

## 2020-07-22 DIAGNOSIS — K219 Gastro-esophageal reflux disease without esophagitis: Secondary | ICD-10-CM | POA: Insufficient documentation

## 2020-07-22 DIAGNOSIS — D631 Anemia in chronic kidney disease: Secondary | ICD-10-CM

## 2020-07-22 DIAGNOSIS — N189 Chronic kidney disease, unspecified: Secondary | ICD-10-CM | POA: Insufficient documentation

## 2020-07-22 DIAGNOSIS — I252 Old myocardial infarction: Secondary | ICD-10-CM | POA: Diagnosis not present

## 2020-07-22 DIAGNOSIS — D4620 Refractory anemia with excess of blasts, unspecified: Secondary | ICD-10-CM | POA: Diagnosis not present

## 2020-07-22 DIAGNOSIS — Z87442 Personal history of urinary calculi: Secondary | ICD-10-CM | POA: Diagnosis not present

## 2020-07-22 DIAGNOSIS — R918 Other nonspecific abnormal finding of lung field: Secondary | ICD-10-CM | POA: Diagnosis not present

## 2020-07-22 DIAGNOSIS — D500 Iron deficiency anemia secondary to blood loss (chronic): Secondary | ICD-10-CM

## 2020-07-22 DIAGNOSIS — I2510 Atherosclerotic heart disease of native coronary artery without angina pectoris: Secondary | ICD-10-CM | POA: Diagnosis not present

## 2020-07-22 DIAGNOSIS — Z87891 Personal history of nicotine dependence: Secondary | ICD-10-CM | POA: Insufficient documentation

## 2020-07-22 DIAGNOSIS — Z79899 Other long term (current) drug therapy: Secondary | ICD-10-CM | POA: Diagnosis not present

## 2020-07-22 DIAGNOSIS — D46Z Other myelodysplastic syndromes: Secondary | ICD-10-CM | POA: Insufficient documentation

## 2020-07-22 DIAGNOSIS — I509 Heart failure, unspecified: Secondary | ICD-10-CM | POA: Diagnosis not present

## 2020-07-22 LAB — CBC WITH DIFFERENTIAL/PLATELET
Abs Immature Granulocytes: 0.08 10*3/uL — ABNORMAL HIGH (ref 0.00–0.07)
Basophils Absolute: 0 10*3/uL (ref 0.0–0.1)
Basophils Relative: 1 %
Eosinophils Absolute: 0.1 10*3/uL (ref 0.0–0.5)
Eosinophils Relative: 1 %
HCT: 29.3 % — ABNORMAL LOW (ref 39.0–52.0)
Hemoglobin: 9.2 g/dL — ABNORMAL LOW (ref 13.0–17.0)
Immature Granulocytes: 1 %
Lymphocytes Relative: 36 %
Lymphs Abs: 2.3 10*3/uL (ref 0.7–4.0)
MCH: 33.2 pg (ref 26.0–34.0)
MCHC: 31.4 g/dL (ref 30.0–36.0)
MCV: 105.8 fL — ABNORMAL HIGH (ref 80.0–100.0)
Monocytes Absolute: 2 10*3/uL — ABNORMAL HIGH (ref 0.1–1.0)
Monocytes Relative: 31 %
Neutro Abs: 1.9 10*3/uL (ref 1.7–7.7)
Neutrophils Relative %: 30 %
Platelets: 178 10*3/uL (ref 150–400)
RBC: 2.77 MIL/uL — ABNORMAL LOW (ref 4.22–5.81)
RDW: 17.2 % — ABNORMAL HIGH (ref 11.5–15.5)
Smear Review: ADEQUATE
WBC: 6.4 10*3/uL (ref 4.0–10.5)
nRBC: 0 % (ref 0.0–0.2)

## 2020-07-22 LAB — BASIC METABOLIC PANEL
Anion gap: 6 (ref 5–15)
BUN: 27 mg/dL — ABNORMAL HIGH (ref 8–23)
CO2: 23 mmol/L (ref 22–32)
Calcium: 9 mg/dL (ref 8.9–10.3)
Chloride: 107 mmol/L (ref 98–111)
Creatinine, Ser: 1.78 mg/dL — ABNORMAL HIGH (ref 0.61–1.24)
GFR, Estimated: 36 mL/min — ABNORMAL LOW (ref >60)
Glucose, Bld: 92 mg/dL (ref 70–99)
Potassium: 4.7 mmol/L (ref 3.5–5.1)
Sodium: 136 mmol/L (ref 135–145)

## 2020-07-22 MED ORDER — DARBEPOETIN ALFA 300 MCG/0.6ML IJ SOSY
300.0000 ug | PREFILLED_SYRINGE | Freq: Once | INTRAMUSCULAR | Status: AC
  Administered 2020-07-22: 300 ug via SUBCUTANEOUS
  Filled 2020-07-22: qty 0.6

## 2020-07-22 NOTE — Progress Notes (Signed)
[facility] CONSULT NOTE  Patient Care Team: Rusty Aus, MD as PCP - General (Internal Medicine) Alisa Graff, FNP as Nurse Practitioner (Family Medicine) Ubaldo Glassing Javier Docker, MD as Consulting Physician (Cardiology) Erby Pian, MD as Referring Physician (Specialist) Cammie Sickle, MD as Consulting Physician (Hematology and Oncology)  CHIEF COMPLAINTS/PURPOSE OF CONSULTATION:    HISTORY OF PRESENTING ILLNESS:  Andres Gutierrez 87 y.o.  male history of above history of chronic anemia and intermittent thrombocytopenia is here for follow-up/ currently on IV iron and also Aranesp here for follow-up/results of the bone marrow biopsy.  In the interim patient had a bone marrow biopsy; post procedure uneventful.  Patient was recently admitted to hospital/evaluated in the emergency room for right upper extremity weakness/tingling and numbness.  CT head brain negative for stroke.  Patient continues to have mild shortness of breath on exertion.  Chronic fatigue.  No swelling the legs.   Review of Systems  Constitutional: Positive for malaise/fatigue. Negative for chills, diaphoresis, fever and weight loss.  HENT: Negative for nosebleeds and sore throat.   Eyes: Negative for double vision.  Respiratory: Positive for shortness of breath. Negative for cough, hemoptysis, sputum production and wheezing.   Cardiovascular: Negative for chest pain, palpitations, orthopnea and leg swelling.  Gastrointestinal: Negative for abdominal pain, blood in stool, constipation, diarrhea, heartburn, melena, nausea and vomiting.  Genitourinary: Negative for dysuria, frequency and urgency.  Musculoskeletal: Positive for back pain and joint pain.  Skin: Negative.  Negative for itching and rash.  Neurological: Negative for dizziness, tingling, focal weakness, weakness and headaches.  Endo/Heme/Allergies: Does not bruise/bleed easily.  Psychiatric/Behavioral: Negative for depression. The  patient is not nervous/anxious and does not have insomnia.      MEDICAL HISTORY:  Past Medical History:  Diagnosis Date  . Anemia   . Anginal pain ([place])   . Anxiety   . Cardiomyopathy ([place])   . Cerebral amyloid angiopathy ([place])   . CHF (congestive heart failure) ([place])   . Chronic kidney disease    kidney stones  . Coronary artery disease   . Cough   . Dyspnea   . GERD (gastroesophageal reflux disease)   . History of kidney stones   . Hypertension   . Lymphadenopathy, hilar   . MI (myocardial infarction) ([place])    x 2  . Wheezing     SURGICAL HISTORY: Past Surgical History:  Procedure Laterality Date  . BRONCHIAL NEEDLE ASPIRATION BIOPSY N/A 12/31/2014   Procedure: BRONCHIAL NEEDLE ASPIRATION BIOPSIES from carina;  Surgeon: Flora Lipps, MD;  Location: [facility];  Service: Cardiopulmonary;  Laterality: N/A;  . CARDIAC CATHETERIZATION N/A 12/28/2015   Procedure: Left Heart Cath and Cors/Grafts Angiography;  Surgeon: Yolonda Kida, MD;  Location: [facility];  Service: Cardiovascular;  Laterality: N/A;  . CATARACT EXTRACTION W/PHACO Left 08/01/2016   Procedure: CATARACT EXTRACTION PHACO AND INTRAOCULAR LENS PLACEMENT ([place]) Left;  Surgeon: Leandrew Koyanagi, MD;  Location: [facility];  Service: Ophthalmology;  Laterality: Left;  . CHOLECYSTECTOMY    . COLON SURGERY    . CORONARY ANGIOPLASTY WITH STENT PLACEMENT    . CORONARY ARTERY BYPASS GRAFT    . CYSTOSCOPY/URETEROSCOPY/HOLMIUM LASER/STENT PLACEMENT Left 03/18/2020   Procedure: CYSTOSCOPY/URETEROSCOPY/HOLMIUM LASER/STENT PLACEMENT;  Surgeon: Billey Co, MD;  Location: [facility];  Service: Urology;  Laterality: Left;  . ENDOBRONCHIAL ULTRASOUND N/A 12/31/2014   Procedure: ENDOBRONCHIAL ULTRASOUND;  Surgeon: Flora Lipps, MD;  Location: [facility];  Service: Cardiopulmonary;  Laterality:  N/A;  . ESOPHAGOGASTRODUODENOSCOPY (EGD) WITH PROPOFOL N/A 06/20/2015   Procedure: ESOPHAGOGASTRODUODENOSCOPY (EGD) WITH  PROPOFOL;  Surgeon: Lollie Sails, MD;  Location: [facility]OSCOPY;  Service: Endoscopy;  Laterality: N/A;  . IR CATHETER TUBE CHANGE  10/26/2019  . IR CHOLANGIOGRAM EXISTING TUBE  08/13/2019    SOCIAL HISTORY: lives in [place]; alone. 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  Social History   Socioeconomic History  . Marital status: Widowed    Spouse name: Not on file  . Number of children: 2  . Years of education: college  . Highest education level: Some college, no degree  Occupational History  . Occupation: retired  Tobacco Use  . Smoking status: Former Smoker    Quit date: 06/27/1977    Years since quitting: 43.0  . Smokeless tobacco: Current User    Types: Chew  Vaping Use  . Vaping Use: Never used  Substance and Sexual Activity  . Alcohol use: Yes    Comment: occational almost rare once a year  . Drug use: No  . Sexual activity: Not Currently    Birth control/protection: Abstinence  Other Topics Concern  . Not on file  Social History Narrative  . Not on file   Social Determinants of Health   Financial Resource Strain: Not on file  Food Insecurity: Not on file  Transportation Needs: Not on file  Physical Activity: Not on file  Stress: Not on file  Social Connections: Not on file  Intimate Partner Violence: Not on file    FAMILY HISTORY: Family History  Problem Relation Age of Onset  . CAD Mother   . Colon cancer Father     ALLERGIES:  is allergic to levaquin [levofloxacin], escitalopram, 5ht3 receptor antagonists, diltiazem, diphenhydramine hcl, maxidex [dexamethasone], prednisone, serotonin, vytorin [ezetimibe-simvastatin], and zocor [simvastatin].  MEDICATIONS:  Current Outpatient Medications  Medication Sig Dispense Refill  . Ascorbic Acid (VITAMIN C PO) Take 1 tablet by mouth daily.    Marland Kitchen aspirin 81 MG EC tablet Take 1 tablet (81 mg total) by mouth daily. RESUME 48HRS after surgery (Patient taking differently: Take 81 mg by mouth daily.) 30 tablet 12  . atorvastatin  (LIPITOR) 40 MG tablet Take 1 tablet (40 mg total) by mouth daily with supper.    . digoxin (LANOXIN) 0.125 MG tablet Take 0.0625 mg by mouth daily.    . famotidine (PEPCID) 20 MG tablet Take 20 mg by mouth at bedtime.    . feeding supplement (ENSURE ENLIVE / ENSURE PLUS) LIQD Take 237 mLs by mouth 2 (two) times daily between meals. 10000 mL 0  . ferrous sulfate 325 (65 FE) MG tablet Take 1 tablet (325 mg total) by mouth every Monday, Wednesday, and Friday. With supper    . furosemide (LASIX) 20 MG tablet Take 1 tablet (20 mg total) by mouth daily as needed (for weight gain of 3lbs in 1 day or 5 lbs in 2 days or shortness of breath or leg swelling). 30 tablet 0  . metoprolol tartrate (LOPRESSOR) 25 MG tablet Take 12.5 mg by mouth at bedtime.    . Multiple Vitamin (MULTIVITAMIN) tablet Take 1 tablet by mouth daily.     Marland Kitchen neomycin-bacitracin-polymyxin (NEOSPORIN) ointment Apply 1 application topically every other day. Wound care (foot area)    . pantoprazole (PROTONIX) 20 MG tablet Take 20 mg by mouth daily before breakfast.    . PRESCRIPTION MEDICATION     . ranolazine (RANEXA) 1000 MG SR tablet Take 500 mg by mouth 2 (two) times  daily.     . sucralfate (CARAFATE) 1 G tablet Take 1 g by mouth 2 (two) times daily.     . tamsulosin (FLOMAX) 0.4 MG CAPS capsule Take 0.4 mg by mouth daily after supper.     . vitamin B-12 (CYANOCOBALAMIN) 500 MCG tablet Take 500 mcg by mouth daily.     No current facility-administered medications for this visit.     PHYSICAL EXAMINATION: ECOG PERFORMANCE STATUS: 0 - Asymptomatic  Vitals:   07/22/20 1029  BP: (!) 157/54  Pulse: (!) 58  Resp: 16  Temp: (!) 95.8 F (35.4 C)  SpO2: 98%   Filed Weights   07/22/20 1029  Weight: 185 lb 9.6 oz (84.2 kg)    Physical Exam Constitutional:      Comments: Elderly Caucasian male patient. He accompanied by his son.  Patient ambulating independently.   HENT:     Head: Normocephalic and atraumatic.      Mouth/Throat:     Pharynx: No oropharyngeal exudate.  Eyes:     Pupils: Pupils are equal, round, and reactive to light.  Cardiovascular:     Rate and Rhythm: Normal rate and regular rhythm.  Pulmonary:     Effort: No respiratory distress.     Breath sounds: Normal breath sounds. No wheezing.  Abdominal:     General: Bowel sounds are normal. There is no distension.     Palpations: Abdomen is soft. There is no mass.     Tenderness: There is no abdominal tenderness. There is no guarding or rebound.  Musculoskeletal:        General: No tenderness. Normal range of motion.     Cervical back: Normal range of motion and neck supple.  Skin:    General: Skin is warm.  Neurological:     Mental Status: He is alert and oriented to person, place, and time.  Psychiatric:        Mood and Affect: Affect normal.    .   LABORATORY DATA:  I have reviewed the data as listed Lab Results  Component Value Date   WBC 6.4 07/22/2020   HGB 9.2 (L) 07/22/2020   HCT 29.3 (L) 07/22/2020   MCV 105.8 (H) 07/22/2020   PLT 178 07/22/2020   Recent Labs    10/21/19 1231 11/06/19 1429 11/11/19 1104 01/07/20 0916 02/18/20 1036 02/26/20 0542 02/27/20 1256 03/03/20 0858 05/27/20 1020 06/24/20 1036 07/18/20 1500 07/22/20 0951  NA  --   --  139 137   < > 137   < > 138   < > 138 134* 136  K  --   --  4.6 3.8   < > 4.4   < > 4.4   < > 4.8 4.2 4.7  CL  --   --  104 104   < > 102   < > 105   < > 106 103 107  CO2  --   --  26 23   < > 25   < > 22   < > 23 20* 23  GLUCOSE  --   --  112* 119*   < > 101*   < > 100*   < > 113* 97 92  BUN  --   --  21 23   < > 30*   < > 29*   < > 28* 25* 27*  CREATININE  --  1.90* 1.51* 1.58*   < > 1.77*   < > 1.85*   < >  1.82* 1.73* 1.78*  CALCIUM  --   --  9.0 8.7*   < > 9.1   < > 9.2   < > 8.9 9.2 9.0  GFRNONAA  --  31* 41* 39*   < > 34*   < > 35*   < > 36* 38* 36*  GFRAA  --  36* 48* 45*  --   --   --   --   --   --   --   --   PROT 7.7  --   --   --   --  7.5  --  7.7   --   --  7.3  --   ALBUMIN 4.4  --   --   --   --  4.3  --  4.4  --   --  4.4  --   AST 23  --   --   --   --  19  --  20  --   --  22  --   ALT 28  --   --   --   --  17  --  16  --   --  13  --   ALKPHOS 56  --   --   --   --  55  --  57  --   --  60  --   BILITOT 1.4*  --   --   --   --  0.9  --  1.1  --   --  1.0  --   BILIDIR 0.2  --   --   --   --   --   --   --   --   --   --   --   IBILI 1.2*  --   --   --   --   --   --   --   --   --   --   --    < > = values in this interval not displayed.   ASSESSMENT & PLAN:   Anemia of chronic kidney failure, stage 3 (moderate) (HCC) #Chronic anemia-MDS/CKD-III [s/p BMBx-low-grade myelomonocytic; FEB 2022]; deletion 20.   I reviewed the bone marrow biopsy/pathology with the patient and son in detail.  Continue aranesp every 2 weeks for now.  Discussed with therapeutic importance of foundation 1 heme.  #Today hemoglobin is 9.2; proceed with aranesp.  Continue p.o. iron  # Mild intermittent thrombocytopenia-question ITP [nadir November 2020-80s].  Today platelets 250s ; ok to with asprin. STABLE.    #Recent acute CHF-2025% ejection fraction [[facility]]-see below ;on  Lasix 20 mg a day [half pill once a day]; STABLE.   #CKD stage III-GFR-33; STABLE.   #Left upper lobe cystic lesion-[incidentally] CT scan- AUG 25th- 2021- CT scan-left upper lobe cystic lesion-4.5 cm-/slightly bigger by few mm- STABLE.   #Disposition: #  Proceed with Aranesp today # in 2 weeks- cbc possible aranesp # in 4 weeks MD-cbc/bmp; possible aranesp; [facility]-Dr.B        Cammie Sickle, MD 07/22/2020 1:50 PM

## 2020-07-22 NOTE — Assessment & Plan Note (Addendum)
#  Chronic anemia-MDS/CKD-III [s/p BMBx-low-grade myelomonocytic; FEB 2022]; deletion 20.   I reviewed the bone marrow biopsy/pathology with the patient and son in detail.  Continue aranesp every 2 weeks for now.  Discussed with therapeutic importance of foundation 1 heme.  #Today hemoglobin is 9.2; proceed with aranesp.  Continue p.o. iron  # Mild intermittent thrombocytopenia-question ITP [nadir November 2020-80s].  Today platelets 250s ; ok to with asprin. STABLE.    #Recent acute CHF-2025% ejection fraction [[facility]]-see below ;on  Lasix 20 mg a day [half pill once a day]; STABLE.   #CKD stage III-GFR-33; STABLE.   #Left upper lobe cystic lesion-[incidentally] CT scan- AUG 25th- 2021- CT scan-left upper lobe cystic lesion-4.5 cm-/slightly bigger by few mm- STABLE.   #Disposition: #  Proceed with Aranesp today # in 2 weeks- cbc possible aranesp # in 4 weeks MD-cbc/bmp; possible aranesp; [facility]-Dr.B

## 2020-08-05 ENCOUNTER — Inpatient Hospital Stay: Payer: PPO

## 2020-08-05 VITALS — BP 117/62 | HR 65

## 2020-08-05 DIAGNOSIS — D631 Anemia in chronic kidney disease: Secondary | ICD-10-CM

## 2020-08-05 DIAGNOSIS — D4620 Refractory anemia with excess of blasts, unspecified: Secondary | ICD-10-CM

## 2020-08-05 DIAGNOSIS — N183 Chronic kidney disease, stage 3 unspecified: Secondary | ICD-10-CM

## 2020-08-05 DIAGNOSIS — I130 Hypertensive heart and chronic kidney disease with heart failure and stage 1 through stage 4 chronic kidney disease, or unspecified chronic kidney disease: Secondary | ICD-10-CM | POA: Diagnosis not present

## 2020-08-05 DIAGNOSIS — D500 Iron deficiency anemia secondary to blood loss (chronic): Secondary | ICD-10-CM

## 2020-08-05 LAB — CBC WITH DIFFERENTIAL/PLATELET
Abs Immature Granulocytes: 0.1 10*3/uL — ABNORMAL HIGH (ref 0.00–0.07)
Basophils Absolute: 0 10*3/uL (ref 0.0–0.1)
Basophils Relative: 0 %
Eosinophils Absolute: 0 10*3/uL (ref 0.0–0.5)
Eosinophils Relative: 0 %
HCT: 26.7 % — ABNORMAL LOW (ref 39.0–52.0)
Hemoglobin: 8.4 g/dL — ABNORMAL LOW (ref 13.0–17.0)
Lymphocytes Relative: 33 %
Lymphs Abs: 2.7 10*3/uL (ref 0.7–4.0)
MCH: 32.6 pg (ref 26.0–34.0)
MCHC: 31.5 g/dL (ref 30.0–36.0)
MCV: 103.5 fL — ABNORMAL HIGH (ref 80.0–100.0)
Monocytes Absolute: 2.7 10*3/uL — ABNORMAL HIGH (ref 0.1–1.0)
Monocytes Relative: 32 %
Myelocytes: 1 %
Neutro Abs: 2.8 10*3/uL (ref 1.7–7.7)
Neutrophils Relative %: 34 %
Platelets: 159 10*3/uL (ref 150–400)
RBC: 2.58 MIL/uL — ABNORMAL LOW (ref 4.22–5.81)
RDW: 17.2 % — ABNORMAL HIGH (ref 11.5–15.5)
Smear Review: NORMAL
WBC: 8.3 10*3/uL (ref 4.0–10.5)
nRBC: 0 % (ref 0.0–0.2)

## 2020-08-05 MED ORDER — DARBEPOETIN ALFA 300 MCG/0.6ML IJ SOSY
300.0000 ug | PREFILLED_SYRINGE | Freq: Once | INTRAMUSCULAR | Status: AC
  Administered 2020-08-05: 300 ug via SUBCUTANEOUS
  Filled 2020-08-05: qty 0.6

## 2020-08-10 LAB — SURGICAL PATHOLOGY

## 2020-08-11 DIAGNOSIS — D500 Iron deficiency anemia secondary to blood loss (chronic): Secondary | ICD-10-CM | POA: Diagnosis not present

## 2020-08-11 DIAGNOSIS — Z8719 Personal history of other diseases of the digestive system: Secondary | ICD-10-CM | POA: Diagnosis not present

## 2020-08-11 DIAGNOSIS — K921 Melena: Secondary | ICD-10-CM | POA: Diagnosis not present

## 2020-08-12 ENCOUNTER — Emergency Department: Payer: PPO

## 2020-08-12 ENCOUNTER — Other Ambulatory Visit: Payer: Self-pay

## 2020-08-12 ENCOUNTER — Telehealth: Payer: Self-pay | Admitting: *Deleted

## 2020-08-12 ENCOUNTER — Inpatient Hospital Stay
Admission: EM | Admit: 2020-08-12 | Discharge: 2020-08-17 | DRG: 378 | Disposition: A | Discharge status: Home / Self Care | Payer: PPO | Attending: Internal Medicine | Admitting: Internal Medicine

## 2020-08-12 ENCOUNTER — Encounter: Payer: Self-pay | Admitting: Intensive Care

## 2020-08-12 DIAGNOSIS — I252 Old myocardial infarction: Secondary | ICD-10-CM

## 2020-08-12 DIAGNOSIS — E785 Hyperlipidemia, unspecified: Secondary | ICD-10-CM | POA: Diagnosis present

## 2020-08-12 DIAGNOSIS — Z79899 Other long term (current) drug therapy: Secondary | ICD-10-CM

## 2020-08-12 DIAGNOSIS — I680 Cerebral amyloid angiopathy: Secondary | ICD-10-CM | POA: Diagnosis present

## 2020-08-12 DIAGNOSIS — Z955 Presence of coronary angioplasty implant and graft: Secondary | ICD-10-CM

## 2020-08-12 DIAGNOSIS — D638 Anemia in other chronic diseases classified elsewhere: Secondary | ICD-10-CM | POA: Diagnosis present

## 2020-08-12 DIAGNOSIS — I2510 Atherosclerotic heart disease of native coronary artery without angina pectoris: Secondary | ICD-10-CM | POA: Diagnosis present

## 2020-08-12 DIAGNOSIS — I5042 Chronic combined systolic (congestive) and diastolic (congestive) heart failure: Secondary | ICD-10-CM | POA: Diagnosis present

## 2020-08-12 DIAGNOSIS — K219 Gastro-esophageal reflux disease without esophagitis: Secondary | ICD-10-CM | POA: Diagnosis present

## 2020-08-12 DIAGNOSIS — I130 Hypertensive heart and chronic kidney disease with heart failure and stage 1 through stage 4 chronic kidney disease, or unspecified chronic kidney disease: Secondary | ICD-10-CM | POA: Diagnosis present

## 2020-08-12 DIAGNOSIS — R230 Cyanosis: Secondary | ICD-10-CM | POA: Diagnosis present

## 2020-08-12 DIAGNOSIS — K5521 Angiodysplasia of colon with hemorrhage: Secondary | ICD-10-CM | POA: Diagnosis not present

## 2020-08-12 DIAGNOSIS — Z9841 Cataract extraction status, right eye: Secondary | ICD-10-CM

## 2020-08-12 DIAGNOSIS — I482 Chronic atrial fibrillation, unspecified: Secondary | ICD-10-CM | POA: Diagnosis present

## 2020-08-12 DIAGNOSIS — K921 Melena: Secondary | ICD-10-CM | POA: Diagnosis present

## 2020-08-12 DIAGNOSIS — K2970 Gastritis, unspecified, without bleeding: Secondary | ICD-10-CM | POA: Diagnosis present

## 2020-08-12 DIAGNOSIS — N1831 Chronic kidney disease, stage 3a: Secondary | ICD-10-CM | POA: Diagnosis not present

## 2020-08-12 DIAGNOSIS — Z8249 Family history of ischemic heart disease and other diseases of the circulatory system: Secondary | ICD-10-CM | POA: Diagnosis not present

## 2020-08-12 DIAGNOSIS — K31819 Angiodysplasia of stomach and duodenum without bleeding: Secondary | ICD-10-CM | POA: Diagnosis present

## 2020-08-12 DIAGNOSIS — D649 Anemia, unspecified: Secondary | ICD-10-CM

## 2020-08-12 DIAGNOSIS — D4620 Refractory anemia with excess of blasts, unspecified: Secondary | ICD-10-CM | POA: Diagnosis not present

## 2020-08-12 DIAGNOSIS — Z7982 Long term (current) use of aspirin: Secondary | ICD-10-CM

## 2020-08-12 DIAGNOSIS — D469 Myelodysplastic syndrome, unspecified: Secondary | ICD-10-CM | POA: Diagnosis present

## 2020-08-12 DIAGNOSIS — E854 Organ-limited amyloidosis: Secondary | ICD-10-CM | POA: Diagnosis present

## 2020-08-12 DIAGNOSIS — Z9049 Acquired absence of other specified parts of digestive tract: Secondary | ICD-10-CM

## 2020-08-12 DIAGNOSIS — R0602 Shortness of breath: Secondary | ICD-10-CM | POA: Diagnosis not present

## 2020-08-12 DIAGNOSIS — D500 Iron deficiency anemia secondary to blood loss (chronic): Secondary | ICD-10-CM | POA: Diagnosis not present

## 2020-08-12 DIAGNOSIS — F419 Anxiety disorder, unspecified: Secondary | ICD-10-CM | POA: Diagnosis present

## 2020-08-12 DIAGNOSIS — K922 Gastrointestinal hemorrhage, unspecified: Secondary | ICD-10-CM | POA: Diagnosis present

## 2020-08-12 DIAGNOSIS — Z951 Presence of aortocoronary bypass graft: Secondary | ICD-10-CM | POA: Diagnosis not present

## 2020-08-12 DIAGNOSIS — Z961 Presence of intraocular lens: Secondary | ICD-10-CM | POA: Diagnosis present

## 2020-08-12 DIAGNOSIS — N400 Enlarged prostate without lower urinary tract symptoms: Secondary | ICD-10-CM | POA: Diagnosis present

## 2020-08-12 DIAGNOSIS — J439 Emphysema, unspecified: Secondary | ICD-10-CM | POA: Diagnosis not present

## 2020-08-12 DIAGNOSIS — Q2733 Arteriovenous malformation of digestive system vessel: Secondary | ICD-10-CM | POA: Diagnosis not present

## 2020-08-12 DIAGNOSIS — Z881 Allergy status to other antibiotic agents status: Secondary | ICD-10-CM

## 2020-08-12 DIAGNOSIS — Z87442 Personal history of urinary calculi: Secondary | ICD-10-CM | POA: Diagnosis not present

## 2020-08-12 DIAGNOSIS — N183 Chronic kidney disease, stage 3 unspecified: Secondary | ICD-10-CM | POA: Diagnosis present

## 2020-08-12 DIAGNOSIS — Z20822 Contact with and (suspected) exposure to covid-19: Secondary | ICD-10-CM | POA: Diagnosis present

## 2020-08-12 DIAGNOSIS — K31811 Angiodysplasia of stomach and duodenum with bleeding: Secondary | ICD-10-CM | POA: Diagnosis not present

## 2020-08-12 DIAGNOSIS — Z87891 Personal history of nicotine dependence: Secondary | ICD-10-CM

## 2020-08-12 DIAGNOSIS — K2990 Gastroduodenitis, unspecified, without bleeding: Secondary | ICD-10-CM | POA: Diagnosis not present

## 2020-08-12 DIAGNOSIS — I5022 Chronic systolic (congestive) heart failure: Secondary | ICD-10-CM | POA: Diagnosis not present

## 2020-08-12 DIAGNOSIS — D62 Acute posthemorrhagic anemia: Secondary | ICD-10-CM | POA: Diagnosis present

## 2020-08-12 DIAGNOSIS — N1832 Chronic kidney disease, stage 3b: Secondary | ICD-10-CM | POA: Diagnosis present

## 2020-08-12 DIAGNOSIS — K3189 Other diseases of stomach and duodenum: Secondary | ICD-10-CM | POA: Diagnosis not present

## 2020-08-12 DIAGNOSIS — I10 Essential (primary) hypertension: Secondary | ICD-10-CM | POA: Diagnosis present

## 2020-08-12 DIAGNOSIS — K2100 Gastro-esophageal reflux disease with esophagitis, without bleeding: Secondary | ICD-10-CM | POA: Diagnosis not present

## 2020-08-12 DIAGNOSIS — Z888 Allergy status to other drugs, medicaments and biological substances status: Secondary | ICD-10-CM

## 2020-08-12 DIAGNOSIS — D509 Iron deficiency anemia, unspecified: Secondary | ICD-10-CM | POA: Diagnosis not present

## 2020-08-12 HISTORY — DX: Myelodysplastic syndrome, unspecified: D46.9

## 2020-08-12 LAB — COMPREHENSIVE METABOLIC PANEL
ALT: 14 U/L (ref 0–44)
AST: 22 U/L (ref 15–41)
Albumin: 4.1 g/dL (ref 3.5–5.0)
Alkaline Phosphatase: 64 U/L (ref 38–126)
Anion gap: 9 (ref 5–15)
BUN: 24 mg/dL — ABNORMAL HIGH (ref 8–23)
CO2: 18 mmol/L — ABNORMAL LOW (ref 22–32)
Calcium: 8.6 mg/dL — ABNORMAL LOW (ref 8.9–10.3)
Chloride: 107 mmol/L (ref 98–111)
Creatinine, Ser: 1.7 mg/dL — ABNORMAL HIGH (ref 0.61–1.24)
GFR, Estimated: 39 mL/min — ABNORMAL LOW (ref >60)
Glucose, Bld: 106 mg/dL — ABNORMAL HIGH (ref 70–99)
Potassium: 4.1 mmol/L (ref 3.5–5.1)
Sodium: 134 mmol/L — ABNORMAL LOW (ref 135–145)
Total Bilirubin: 0.7 mg/dL (ref 0.3–1.2)
Total Protein: 7 g/dL (ref 6.5–8.1)

## 2020-08-12 LAB — CBC
HCT: 24.2 % — ABNORMAL LOW (ref 39.0–52.0)
Hemoglobin: 7.8 g/dL — ABNORMAL LOW (ref 13.0–17.0)
MCH: 32.9 pg (ref 26.0–34.0)
MCHC: 32.2 g/dL (ref 30.0–36.0)
MCV: 102.1 fL — ABNORMAL HIGH (ref 80.0–100.0)
Platelets: 164 10*3/uL (ref 150–400)
RBC: 2.37 MIL/uL — ABNORMAL LOW (ref 4.22–5.81)
RDW: 17.6 % — ABNORMAL HIGH (ref 11.5–15.5)
WBC: 8.2 10*3/uL (ref 4.0–10.5)
nRBC: 0 % (ref 0.0–0.2)

## 2020-08-12 LAB — TROPONIN I (HIGH SENSITIVITY)
Troponin I (High Sensitivity): 12 ng/L (ref <18)
Troponin I (High Sensitivity): 12 ng/L (ref <18)

## 2020-08-12 LAB — PROTIME-INR
INR: 1.2 (ref 0.8–1.2)
Prothrombin Time: 14.6 seconds (ref 11.4–15.2)

## 2020-08-12 LAB — RESP PANEL BY RT-PCR (FLU A&B, COVID) ARPGX2
Influenza A by PCR: NEGATIVE
Influenza B by PCR: NEGATIVE
SARS Coronavirus 2 by RT PCR: NEGATIVE

## 2020-08-12 LAB — PREPARE RBC (CROSSMATCH)

## 2020-08-12 MED ORDER — PROMETHAZINE HCL 25 MG PO TABS: 12.5000 mg | ORAL_TABLET | Freq: Four times a day (QID) | ORAL | Status: DC | PRN

## 2020-08-12 MED ORDER — ATORVASTATIN CALCIUM 20 MG PO TABS
40.0000 mg | ORAL_TABLET | Freq: Every day | ORAL | Status: DC
Start: 2020-08-13 — End: 2020-08-17
  Administered 2020-08-13 – 2020-08-16 (×4): 40 mg via ORAL
  Filled 2020-08-12 (×4): qty 2

## 2020-08-12 MED ORDER — ACETAMINOPHEN 325 MG PO TABS: 650.0000 mg | ORAL_TABLET | Freq: Four times a day (QID) | ORAL | Status: DC | PRN

## 2020-08-12 MED ORDER — TRAZODONE HCL 50 MG PO TABS: 50.0000 mg | ORAL_TABLET | Freq: Every evening | ORAL | Status: DC | PRN

## 2020-08-12 MED ORDER — OXYCODONE HCL 5 MG PO TABS: 5.0000 mg | ORAL_TABLET | ORAL | Status: DC | PRN

## 2020-08-12 MED ORDER — POLYETHYLENE GLYCOL 3350 17 G PO PACK: 17.0000 g | PACK | Freq: Every day | ORAL | Status: DC | PRN

## 2020-08-12 MED ORDER — RANOLAZINE ER 500 MG PO TB12
500.0000 mg | ORAL_TABLET | Freq: Two times a day (BID) | ORAL | Status: DC
  Administered 2020-08-13 – 2020-08-17 (×10): 500 mg via ORAL
  Filled 2020-08-12 (×12): qty 1

## 2020-08-12 MED ORDER — SODIUM CHLORIDE 0.9 % IV SOLN
8.0000 mg/h | INTRAVENOUS | Status: DC
  Administered 2020-08-12: 8 mg/h via INTRAVENOUS
  Filled 2020-08-12: qty 80

## 2020-08-12 MED ORDER — LACTATED RINGERS IV SOLN: INTRAVENOUS | Status: AC

## 2020-08-12 MED ORDER — DIGOXIN 125 MCG PO TABS
0.0625 mg | ORAL_TABLET | Freq: Every day | ORAL | Status: DC
  Administered 2020-08-13 – 2020-08-17 (×5): 0.0625 mg via ORAL
  Filled 2020-08-12 (×5): qty 0.5

## 2020-08-12 MED ORDER — SODIUM CHLORIDE 0.9% IV SOLUTION
Freq: Once | INTRAVENOUS | Status: AC
  Filled 2020-08-12: qty 250

## 2020-08-12 MED ORDER — VITAMIN B-12 1000 MCG PO TABS
500.0000 ug | ORAL_TABLET | Freq: Every day | ORAL | Status: DC
  Administered 2020-08-13 – 2020-08-17 (×5): 500 ug via ORAL
  Filled 2020-08-12 (×5): qty 1

## 2020-08-12 MED ORDER — TAMSULOSIN HCL 0.4 MG PO CAPS
0.4000 mg | ORAL_CAPSULE | Freq: Every day | ORAL | Status: DC
  Administered 2020-08-13 – 2020-08-16 (×4): 0.4 mg via ORAL
  Filled 2020-08-12 (×4): qty 1

## 2020-08-12 MED ORDER — SODIUM CHLORIDE 0.9 % IV SOLN
80.0000 mg | Freq: Once | INTRAVENOUS | Status: AC
  Administered 2020-08-12: 80 mg via INTRAVENOUS
  Filled 2020-08-12: qty 80

## 2020-08-12 MED ORDER — METOPROLOL TARTRATE 25 MG PO TABS
12.5000 mg | ORAL_TABLET | Freq: Every day | ORAL | Status: DC
  Administered 2020-08-13 – 2020-08-15 (×4): 12.5 mg via ORAL
  Filled 2020-08-12 (×5): qty 1

## 2020-08-12 MED ORDER — ACETAMINOPHEN 650 MG RE SUPP: 650.0000 mg | Freq: Four times a day (QID) | RECTAL | Status: DC | PRN

## 2020-08-12 MED ORDER — PANTOPRAZOLE SODIUM 40 MG IV SOLR: 40.0000 mg | Freq: Two times a day (BID) | INTRAVENOUS | Status: DC

## 2020-08-12 NOTE — Telephone Encounter (Signed)
Dr. Rogue Bussing spoke with patient's daughter. Given melena and pt's drop in hgb and symptoms of shortness of breath, patient was advised to go to the ER.

## 2020-08-12 NOTE — ED Provider Notes (Signed)
[facility] Emergency Department Provider Note  ____________________________________________   Event Date/Time   First MD Initiated Contact with Patient 08/12/20 1806     (approximate)  I have reviewed the triage vital signs and the nursing notes.   HISTORY  Chief Complaint Shortness of Breath and GI Bleeding    HPI Andres Gutierrez is a 87 y.o. male with CHF, cardiomyopathy, MI who comes in for dark stools.  Patient was sent by PCP for blood transfusion due to a hemoglobin of 7.9.  Patient states that he started having dark stools on Wednesday, intermittent, constant, nothing makes better, nothing makes it worse.  He does report he is not on any blood thinners.  Denies any liver disease or alcohol use or significant NSAID use.  States that he had some bleeding issues a few years ago but they were not sure what caused it.  He does report some shortness of breath associated with it that is constant, nothing makes it better.  He denies any chest pain or leg swelling.  No abdominal pain.            Past Medical History:  Diagnosis Date  . Anemia   . Anginal pain ([place])   . Anxiety   . Cardiomyopathy ([place])   . Cerebral amyloid angiopathy ([place])   . CHF (congestive heart failure) ([place])   . Chronic kidney disease    kidney stones  . Coronary artery disease   . Cough   . Dyspnea   . GERD (gastroesophageal reflux disease)   . History of kidney stones   . Hypertension   . Lymphadenopathy, hilar   . MDS (myelodysplastic syndrome) ([place])   . MI (myocardial infarction) ([place])    x 2  . Wheezing     Patient Active Problem List   Diagnosis Date Noted  . MDS (myelodysplastic syndrome), low grade ([place]) 07/22/2020  . Cerebral amyloid angiopathy ([place]) 03/05/2020  . Abdominal pain 03/03/2020  . HLD (hyperlipidemia) 03/03/2020  . Blurry vision 03/03/2020  . CKD (chronic kidney disease), stage IIIa 03/03/2020  . Left ureteral stone 03/03/2020  . Generalized  weakness   . Cerebral hemorrhage, nontraumatic (HCC) 03/02/2020  . Chronic cholecystitis 11/06/2019  . Malnutrition of moderate degree 07/30/2019  . Acute neutrophilia 07/30/2019  . Sepsis ([place]) 07/29/2019  . Acute cholecystitis 07/29/2019  . Foot infection   . Acute on chronic heart failure ([place]) 06/07/2019  . Weakness   . NSTEMI (non-ST elevated myocardial infarction) ([place])   . Orthostatic hypotension 01/07/2019  . Macrocytic anemia 08/05/2018  . Anemia of chronic kidney failure, stage 3 (moderate) ([place]) 08/05/2018  . BPH (benign prostatic hyperplasia) 07/24/2018  . Gastroesophageal reflux disease with esophagitis 07/24/2018  . Stage 3 chronic kidney disease ([place]) 07/24/2018  . Thrombocytopenia ([place]) 07/24/2018  . Primary osteoarthritis of both knees 08/22/2017  . Medicare annual wellness visit, initial 11/27/2016  . Chronic systolic heart failure ([place]) 01/17/2016  . Chewing tobacco use 01/17/2016  . Atrial fibrillation with RVR ([place]) 12/23/2015  . Dyspnea on exertion 12/23/2015  . Elevated troponin 12/23/2015  . GERD (gastroesophageal reflux disease) 12/23/2015  . Iron deficiency anemia due to chronic blood loss 12/20/2015  . Femoral neck fracture, left, closed, initial encounter 11/03/2015  . Chronic atrial fibrillation ([place])   . Coronary artery disease involving native coronary artery of native heart without angina pectoris   . Acalculous cholecystitis 10/29/2015  . B12 deficiency 08/03/2015  . Erosive esophagitis 08/03/2015  . Adenopathy   .  CAD (coronary artery disease) 12/22/2014  . HTN (hypertension) 12/22/2014  . Cardiomyopathy, ischemic 07/27/2014    Past Surgical History:  Procedure Laterality Date  . BRONCHIAL NEEDLE ASPIRATION BIOPSY N/A 12/31/2014   Procedure: BRONCHIAL NEEDLE ASPIRATION BIOPSIES from carina;  Surgeon: Flora Lipps, MD;  Location: [facility] ORS;  Service: Cardiopulmonary;  Laterality: N/A;  . CARDIAC CATHETERIZATION N/A 12/28/2015   Procedure: Left  Heart Cath and Cors/Grafts Angiography;  Surgeon: Yolonda Kida, MD;  Location: [place] [facility];  Service: Cardiovascular;  Laterality: N/A;  . CATARACT EXTRACTION W/PHACO Left 08/01/2016   Procedure: CATARACT EXTRACTION PHACO AND INTRAOCULAR LENS PLACEMENT ([place]) Left;  Surgeon: Leandrew Koyanagi, MD;  Location: [place];  Service: Ophthalmology;  Laterality: Left;  . CHOLECYSTECTOMY    . COLON SURGERY    . CORONARY ANGIOPLASTY WITH STENT PLACEMENT    . CORONARY ARTERY BYPASS GRAFT    . CYSTOSCOPY/URETEROSCOPY/HOLMIUM LASER/STENT PLACEMENT Left 03/18/2020   Procedure: CYSTOSCOPY/URETEROSCOPY/HOLMIUM LASER/STENT PLACEMENT;  Surgeon: Billey Co, MD;  Location: [facility] ORS;  Service: Urology;  Laterality: Left;  . ENDOBRONCHIAL ULTRASOUND N/A 12/31/2014   Procedure: ENDOBRONCHIAL ULTRASOUND;  Surgeon: Flora Lipps, MD;  Location: [facility] ORS;  Service: Cardiopulmonary;  Laterality: N/A;  . ESOPHAGOGASTRODUODENOSCOPY (EGD) WITH PROPOFOL N/A 06/20/2015   Procedure: ESOPHAGOGASTRODUODENOSCOPY (EGD) WITH PROPOFOL;  Surgeon: Lollie Sails, MD;  Location[facility] END[facility]COPY;  Service: Endoscopy;  Laterality: N/A;  . IR CATHETER TUBE CHANGE  10/26/2019  . IR CHOLANGIOGRAM EXISTING TUBE  08/13/2019    Prior to Admission medications   Medication Sig Start Date End Date Taking? Authorizing Provider  Ascorbic Acid (VITAMIN C PO) Take 1 tablet by mouth daily.    [provider]  aspirin 81 MG EC tablet Take 1 tablet (81 mg total) by mouth daily. RESUME 48HRS after surgery Patient taking differently: Take 81 mg by mouth daily. 11/06/19   Lysle Pearl, Isami, DO  atorvastatin (LIPITOR) 40 MG tablet Take 1 tablet (40 mg total) by mouth daily with supper. 02/27/20   Enzo Bi, MD  digoxin (LANOXIN) 0.125 MG tablet Take 0.0625 mg by mouth daily.    [provider]  famotidine (PEPCID) 20 MG tablet Take 20 mg by mouth at bedtime. 02/04/19   [provider]  feeding supplement  (ENSURE ENLIVE / ENSURE PLUS) LIQD Take 237 mLs by mouth 2 (two) times daily between meals. 03/04/20   Lavina Hamman, MD  ferrous sulfate 325 (65 FE) MG tablet Take 1 tablet (325 mg total) by mouth every Monday, Wednesday, and Friday. With supper 02/29/20   Enzo Bi, MD  furosemide (LASIX) 20 MG tablet Take 1 tablet (20 mg total) by mouth daily as needed (for weight gain of 3lbs in 1 day or 5 lbs in 2 days or shortness of breath or leg swelling). 03/04/20   Lavina Hamman, MD  metoprolol tartrate (LOPRESSOR) 25 MG tablet Take 12.5 mg by mouth at bedtime.    [provider]  Multiple Vitamin (MULTIVITAMIN) tablet Take 1 tablet by mouth daily.     [provider]  neomycin-bacitracin-polymyxin (NEOSPORIN) ointment Apply 1 application topically every other day. Wound care (foot area)    [provider]  pantoprazole (PROTONIX) 20 MG tablet Take 20 mg by mouth daily before breakfast.    [provider]  PRESCRIPTION MEDICATION     [provider]  ranolazine (RANEXA) 1000 MG SR tablet Take 500 mg by mouth 2 (two) times daily.  01/16/19   [provider]  sucralfate (CARAFATE) 1 G tablet Take 1 g by mouth 2 (two) times daily.     [provider]  tamsulosin (FLOMAX) 0.4 MG CAPS capsule Take 0.4 mg by mouth daily after supper.     [provider]  vitamin B-12 (CYANOCOBALAMIN) 500 MCG tablet Take 500 mcg by mouth daily.    [provider]    Allergies Levaquin [levofloxacin], Escitalopram, 5ht3 receptor antagonists, Diltiazem, Diphenhydramine hcl, Maxidex [dexamethasone], Prednisone, Serotonin, Vytorin [ezetimibe-simvastatin], and Zocor [simvastatin]  Family History  Problem Relation Age of Onset  . CAD Mother   . Colon cancer Father     Social History Social History   Tobacco Use  . Smoking status: Former Smoker    Quit date: 06/27/1977    Years since quitting: 43.1  . Smokeless tobacco: Current User    Types:  Chew  Vaping Use  . Vaping Use: Never used  Substance Use Topics  . Alcohol use: Yes    Comment: occational almost rare once a year  . Drug use: No      Review of Systems Constitutional: No fever/chills Eyes: No visual changes. ENT: No sore throat. Cardiovascular: No chest pain Respiratory: Positive for SOB Gastrointestinal: No abdominal pain.  No nausea, no vomiting.  No diarrhea.  No constipation. Genitourinary: Negative for dysuria.  Dark stool Musculoskeletal: Negative for back pain. Skin: Negative for rash. Neurological: Negative for headaches, focal weakness or numbness. All other ROS negative ____________________________________________   PHYSICAL EXAM:  VITAL SIGNS: ED Triage Vitals  Enc Vitals Group     BP 08/12/20 1546 128/62     Pulse Rate 08/12/20 1546 77     Resp 08/12/20 1546 16     Temp 08/12/20 1624 98 F (36.7 C)     Temp Source 08/12/20 1624 Oral     SpO2 08/12/20 1546 98 %     Weight 08/12/20 1547 182 lb (82.6 kg)     Height 08/12/20 1547 6\' 4"  (1.93 m)     Head Circumference --      Peak Flow --      Pain Score 08/12/20 1547 0     Pain Loc --      Pain Edu? --      Excl. in [place]? --     Constitutional: Alert and oriented. Well appearing and in no acute distress. Eyes: Conjunctivae are normal. EOMI. Head: Atraumatic. Nose: No congestion/rhinnorhea. Mouth/Throat: Mucous membranes are moist.   Neck: No stridor. Trachea Midline. FROM Cardiovascular: Normal rate, regular rhythm. Grossly normal heart sounds.  Good peripheral circulation. Respiratory: Clear lungs, no increased work of breathing Gastrointestinal: Soft and nontender. No distention. No abdominal bruits.  Musculoskeletal: No lower extremity tenderness nor edema.  No joint effusions. Neurologic:  Normal speech and language. No gross focal neurologic deficits are appreciated.  Skin:  Skin is warm, dry and intact. No rash noted. Psychiatric: Mood and affect are normal. Speech and  behavior are normal. GU: Brown stool but Hemoccult positive  ____________________________________________   LABS (all labs ordered are listed, but only abnormal results are displayed)  Labs Reviewed  COMPREHENSIVE METABOLIC PANEL - Abnormal; Notable for the following components:      Result Value   Sodium 134 (*)    CO2 18 (*)    Glucose, Bld 106 (*)    BUN 24 (*)    Creatinine, Ser 1.70 (*)    Calcium 8.6 (*)    GFR, Estimated 39 (*)    All other components within  normal limits  CBC - Abnormal; Notable for the following components:   RBC 2.37 (*)    Hemoglobin 7.8 (*)    HCT 24.2 (*)    MCV 102.1 (*)    RDW 17.6 (*)    All other components within normal limits  PROTIME-INR  POC OCCULT BLOOD, ED  TYPE AND SCREEN   ____________________________________________   ED ECG REPORT I, Vanessa [place], the attending physician, personally viewed and interpreted this ECG.  Atrial fibrillation rate of 71, no ST elevation, T wave inversions in 2 3 aVF, left bundle branch block  Unchanged from prior ____________________________________________  RADIOLOGY I, Vanessa [place], personally viewed and evaluated these images (plain radiographs) as part of my medical decision making, as well as reviewing the written report by the radiologist.  ED MD interpretation: No pneumonia  Official radiology report(s): DG Chest 2 View  Result Date: 08/12/2020 CLINICAL DATA:  87 year old male with shortness of breath. EXAM: CHEST - 2 VIEW COMPARISON:  07/18/2020 FINDINGS: The mediastinal contours are within normal limits. No cardiomegaly. Postsurgical changes after coronary artery bypass graft. Flattening of the hemidiaphragms bilaterally, similar to comparison. Mild blunting of the costophrenic angles bilaterally. Unchanged cavitary lesion in the left apex. No focal consolidation, pleural effusion, or pneumothorax. No acute osseous abnormality. IMPRESSION: No acute cardiopulmonary process. Similar appearing  emphysematous changes. Electronically Signed   By: Ruthann Cancer MD   On: 08/12/2020 16:31    ____________________________________________   PROCEDURES  Procedure(s) performed (including Critical Care):  .1-3 Lead EKG Interpretation Performed by: Vanessa [place], MD Authorized by: Vanessa [place], MD     Interpretation: abnormal     ECG rate:  70s    ECG rate assessment: normal     Rhythm: atrial fibrillation     Ectopy: none     Conduction: normal   .Critical Care Performed by: Vanessa [place], MD Authorized by: Vanessa [place], MD   Critical care provider statement:    Critical care time (minutes):  45   Critical care was necessary to treat or prevent imminent or life-threatening deterioration of the following conditions: GI bleed    Critical care was time spent personally by me on the following activities:  Discussions with consultants, evaluation of patient's response to treatment, examination of patient, ordering and performing treatments and interventions, ordering and review of laboratory studies, ordering and review of radiographic studies, pulse oximetry, re-evaluation of patient's condition, obtaining history from patient or surrogate and review of old charts     ____________________________________________   INITIAL IMPRESSION / [place] / ED COURSE   DUSTON SMOLENSKI was evaluated in Emergency Department on 08/12/2020 for the symptoms described in the history of present illness. He was evaluated in the context of the global COVID-19 pandemic, which necessitated consideration that the patient might be at risk for infection with the SARS-CoV-2 virus that causes COVID-19. Institutional protocols and algorithms that pertain to the evaluation of patients at risk for COVID-19 are in a state of rapid change based on information released by regulatory bodies including the CDC and federal and state organizations. These policies and algorithms were followed during the patient's  care in the ED.     Pt presents with SOB. Differential includes: PNA-will get xray to evaluation Anemia-CBC to evaluate ACS- will get trops Arrhythmia-Will get EKG and keep on monitor.  COVID- will get testing per algorithm. PE-lower suspicion given no risk factors and other cause more likely  Patient's creatinine is  stable at 1.7 however hemoglobin is downtrending from 9.2 3 weeks ago to 7.8 today in the setting of dark stool.  This is most concerning for GI bleed and most likely the cause of shortness of breath given his chest x-ray is negative.  Patient denies any history of liver issues or alcohol use to suggest variceal bleeding.  Patient does have atrial fibrillation but is rate controlled.  Not on blood thinner.  We will keep on the cardiac monitor but will add on a troponin given some T wave inversions but most likely demand from the GI bleed  Patient consented for blood and will give 1 unit.  Patient started on PPI.  Will discuss hospital team for admission  ____________________________________________   FINAL CLINICAL IMPRESSION(S) / ED DIAGNOSES   Final diagnoses:  Symptomatic anemia     MEDICATIONS GIVEN DURING THIS VISIT:  Medications  pantoprazole (PROTONIX) 80 mg in sodium chloride 0.9 % 100 mL IVPB (80 mg Intravenous New Bag/Given 08/12/20 1924)  pantoprazole (PROTONIX) 80 mg in sodium chloride 0.9 % 100 mL (0.8 mg/mL) infusion (has no administration in time range)  pantoprazole (PROTONIX) injection 40 mg (has no administration in time range)  0.9 %  sodium chloride infusion (Manually program via Guardrails IV Fluids) ( Intravenous New Bag/Given 08/12/20 1921)     ED Discharge Orders    None       Note:  This document was prepared using Dragon voice recognition software and may include unintentional dictation errors.   Vanessa [place], MD 08/12/20 Joen [place]

## 2020-08-12 NOTE — ED Triage Notes (Signed)
Patient presents with new onset sob and black watery stools X2 days. PCP sent patient to ER for blood transfusion. Recent HGB levels 7.9. A&O x4  Upon arrival

## 2020-08-12 NOTE — Telephone Encounter (Signed)
Call from daughter [name] reporting that patient has been passing black watery stools since Wed and he saw his PCP yesterday and said that he is having a GI Bleed HGB was 7.8 yesterday and 7.4 today. She is asking if there is anything we can do for him

## 2020-08-12 NOTE — ED Notes (Signed)
Error in charting: This RN signed patient refusal to sign on accident

## 2020-08-12 NOTE — H&P (Signed)
[facility] History and Physical  Andres Gutierrez VZC:[id number] DOB: 08/30/1930 DOA: 08/12/2020  Referring physician: Dr. Jari Pigg PCP: Andres Aus, MD   Chief Complaint: dark stools  HPI: Andres Gutierrez is a 87 y.o. male with history of chronic anemia and intermittent thrombocytopenia, MDS, chronic A. fib, chronic systolic heart failure, CKD, CAD, hypertension, who presents with dark stools.  Patient reports that for the last 2 days he has been having dark black stools.  No bright red blood.  He is also been feeling very weak and tired over this time, his daughter is at bedside and confirms his history.  He has been seen by his PCP for this yesterday with plan to check his CBC today.  On March 11 his hemoglobin was 9.2, down trended to 8.4 on March 25, and today when his PCP checked his CBC had a downtrend of 7.8 so he sent the patient to the ED for evaluation.  Patient endorses taking a baby aspirin but denies any other NSAID use.  He denies any alcohol use.  Review of chart shows the patient underwent an upper endoscopy in February 2017, that time procedure was notable for esophagitis and gastritis.  Pathology showed the same with no evidence of dysplasia or malignancy.  In the ED vital signs were unremarkable.  Lab work-up showed an unremarkable CMP with creatinine at baseline of 1.7, CBC already previously mentioned, serial troponins were normal.  Chest x-ray showed chronic emphysematous changes but was otherwise unremarkable.  Given patient's fatigue he was also given 1 unit of blood, started on a PPI drip, and admitted for further management of presumed GI bleed.  Review of Systems:  Pertinent positives and negative per HPI, all others reviewed and negative  Past Medical History:  Diagnosis Date  . Anemia   . Anginal pain ([place])   . Anxiety   . Cardiomyopathy ([place])   . Cerebral amyloid angiopathy ([place])   . CHF (congestive heart failure) ([place])   . Chronic kidney disease    kidney  stones  . Coronary artery disease   . Cough   . Dyspnea   . GERD (gastroesophageal reflux disease)   . History of kidney stones   . Hypertension   . Lymphadenopathy, hilar   . MDS (myelodysplastic syndrome) ([place])   . MI (myocardial infarction) ([place])    x 2  . Wheezing    Past Surgical History:  Procedure Laterality Date  . BRONCHIAL NEEDLE ASPIRATION BIOPSY N/A 12/31/2014   Procedure: BRONCHIAL NEEDLE ASPIRATION BIOPSIES from [place]ina;  Surgeon: Flora Lipps, MD;  Location: [facility];  Service: Cardiopulmonary;  Laterality: N/A;  . CARDIAC CATHETERIZATION N/A 12/28/2015   Procedure: Left Heart Cath and Cors/Grafts Angiography;  Surgeon: Yolonda Kida, MD;  Location: [place] [facility];  Service: Cardiovascular;  Laterality: N/A;  . CATARACT EXTRACTION W/PHACO Left 08/01/2016   Procedure: CATARACT EXTRACTION PHACO AND INTRAOCULAR LENS PLACEMENT ([place]) Left;  Surgeon: Leandrew Koyanagi, MD;  Location: [place];  Service: Ophthalmology;  Laterality: Left;  . CHOLECYSTECTOMY    . COLON SURGERY    . CORONARY ANGIOPLASTY WITH STENT PLACEMENT    . CORONARY ARTERY BYPASS GRAFT    . CYSTOSCOPY/URETEROSCOPY/HOLMIUM LASER/STENT PLACEMENT Left 03/18/2020   Procedure: CYSTOSCOPY/URETEROSCOPY/HOLMIUM LASER/STENT PLACEMENT;  Surgeon: Billey Co, MD;  Location: [facility];  Service: Urology;  Laterality: Left;  . ENDOBRONCHIAL ULTRASOUND N/A 12/31/2014   Procedure: ENDOBRONCHIAL ULTRASOUND;  Surgeon: Flora Lipps, MD;  Location: [facility];  Service: Cardiopulmonary;  Laterality: N/A;  . ESOPHAGOGASTRODUODENOSCOPY (EGD) WITH PROPOFOL N/A 06/20/2015   Procedure: ESOPHAGOGASTRODUODENOSCOPY (EGD) WITH PROPOFOL;  Surgeon: Lollie Sails, MD;  Location: [facility];  Service: Endoscopy;  Laterality: N/A;  . IR CATHETER TUBE CHANGE  10/26/2019  . IR CHOLANGIOGRAM EXISTING TUBE  08/13/2019   Social History:  reports that he quit smoking about 43 years ago. His smokeless tobacco use includes  chew. He reports current alcohol use. He reports that he does not use drugs.  Allergies  Allergen Reactions  . Levaquin [Levofloxacin]     Andres Gutierrez, her daughter, said Levaquin should be avoided because when patient had Levaquin a few years ago, he had "a bad reaction".  She said he could not walk and could not lift up his feet.  . Escitalopram Other (See Comments)    Reaction:  Makes him feel faint, like he was going to have a heart attack.  . 5ht3 Receptor Antagonists   . Diltiazem Rash  . Diphenhydramine Hcl Other (See Comments)    Reaction:  Rash and fever a long time ago, but has had it since with no problem.  . Maxidex [Dexamethasone] Other (See Comments)    Reaction:  Unknown   . Prednisone Other (See Comments)    Pt states that this medication made him feel crazy.    . Serotonin Other (See Comments)    Tried 2 different types, lexapro and another one.  Reaction:  Made him feel like he was having a heart attack.   . Vytorin [Ezetimibe-Simvastatin] Other (See Comments)    Reaction:  Unknown   . Zocor [Simvastatin] Other (See Comments)    Reaction:  Unknown     Family History  Problem Relation Age of Onset  . CAD Mother   . Colon cancer Father      Prior to Admission medications   Medication Sig Start Date End Date Taking? Authorizing Provider  Ascorbic Acid (VITAMIN C PO) Take 1 tablet by mouth daily.    [provider]  aspirin 81 MG EC tablet Take 1 tablet (81 mg total) by mouth daily. RESUME 48HRS after surgery Patient taking differently: Take 81 mg by mouth daily. 11/06/19   Lysle Pearl, Isami, DO  atorvastatin (LIPITOR) 40 MG tablet Take 1 tablet (40 mg total) by mouth daily with supper. 02/27/20   Enzo Bi, MD  digoxin (LANOXIN) 0.125 MG tablet Take 0.0625 mg by mouth daily.    [provider]  famotidine (PEPCID) 20 MG tablet Take 20 mg by mouth at bedtime. 02/04/19   [provider]  feeding supplement (ENSURE ENLIVE / ENSURE PLUS) LIQD Take 237  mLs by mouth 2 (two) times daily between meals. 03/04/20   Lavina Hamman, MD  ferrous sulfate 325 (65 FE) MG tablet Take 1 tablet (325 mg total) by mouth every Monday, Wednesday, and Friday. With supper 02/29/20   Enzo Bi, MD  furosemide (LASIX) 20 MG tablet Take 1 tablet (20 mg total) by mouth daily as needed (for weight gain of 3lbs in 1 day or 5 lbs in 2 days or shortness of breath or leg swelling). 03/04/20   Lavina Hamman, MD  metoprolol tartrate (LOPRESSOR) 25 MG tablet Take 12.5 mg by mouth at bedtime.    [provider]  Multiple Vitamin (MULTIVITAMIN) tablet Take 1 tablet by mouth daily.     [provider]  neomycin-bacitracin-polymyxin (NEOSPORIN) ointment Apply 1 application topically every other day. Wound care (foot area)    [provider]  pantoprazole (PROTONIX) 20 MG tablet Take 20 mg by mouth daily before breakfast.    [provider]  [place]     [provider]  ranolazine (RANEXA) 1000 MG SR tablet Take 500 mg by mouth 2 (two) times daily.  01/16/19   [provider]  sucralfate (CARAFATE) 1 G tablet Take 1 g by mouth 2 (two) times daily.     [provider]  tamsulosin (FLOMAX) 0.4 MG CAPS capsule Take 0.4 mg by mouth daily after supper.     [provider]  vitamin B-12 (CYANOCOBALAMIN) 500 MCG tablet Take 500 mcg by mouth daily.    [provider]   Physical Exam: Vitals:   08/12/20 1918 08/12/20 1936 08/12/20 2000 08/12/20 2030  BP: (!) 145/67 (!) 141/60 139/63 (!) 121/57  Pulse: 66 67 62 60  Resp: 18 15 19 19   Temp: 97.9 F (36.6 C) 97.7 F (36.5 C)    TempSrc: Oral Oral    SpO2: 100% 100% 100% 100%  Weight:      Height:        Wt Readings from Last 3 Encounters:  08/12/20 82.6 kg  07/22/20 84.2 kg  07/18/20 82.1 kg     . General:  Appears calm and comfortable . Eyes: PERRL, normal lids, irises & conjunctiva . ENT: grossly normal hearing, lips &  tongue . Neck: no LAD, masses or thyromegaly . Cardiovascular: RRR, 2/6 systolic murmur best heard along R sternal border. No LE edema. Marland Kitchen Respiratory: CTA bilaterally, no w/r/r. Normal respiratory effort. . Abdomen: soft, ntnd . Skin: no rash or induration seen on limited exam . Musculoskeletal: grossly normal tone BUE/BLE . Psychiatric: grossly normal mood and affect, speech fluent and appropriate . Neurologic: grossly non-focal.          Labs on Admission:  Basic Metabolic Panel: Recent Labs  Lab 08/12/20 1553  NA 134*  K 4.1  CL 107  CO2 18*  GLUCOSE 106*  BUN 24*  CREATININE 1.70*  CALCIUM 8.6*   Liver Function Tests: Recent Labs  Lab 08/12/20 1553  AST 22  ALT 14  ALKPHOS 64  BILITOT 0.7  PROT 7.0  ALBUMIN 4.1   No results for input(s): LIPASE, AMYLASE in the last 168 hours. No results for input(s): AMMONIA in the last 168 hours. CBC: Recent Labs  Lab 08/12/20 1553  WBC 8.2  HGB 7.8*  HCT 24.2*  MCV 102.1*  PLT 164   Cardiac Enzymes: No results for input(s): CKTOTAL, CKMB, CKMBINDEX, TROPONINI in the last 168 hours.  BNP (last 3 results) Recent Labs    03/03/20 1121  BNP 293.7*    ProBNP (last 3 results) No results for input(s): PROBNP in the last 8760 hours.  CBG: No results for input(s): GLUCAP in the last 168 hours.  Radiological Exams on Admission: DG Chest 2 View  Result Date: 08/12/2020 CLINICAL DATA:  87 year old male with shortness of breath. EXAM: CHEST - 2 VIEW COMPARISON:  07/18/2020 FINDINGS: The mediastinal contours are within normal limits. No cardiomegaly. Postsurgical changes after coronary artery bypass graft. Flattening of the hemidiaphragms bilaterally, similar to comparison. Mild blunting of the costophrenic angles bilaterally. Unchanged cavitary lesion in the left apex. No focal consolidation, pleural effusion, or pneumothorax. No acute osseous abnormality. IMPRESSION: No acute cardiopulmonary process. Similar appearing  emphysematous changes. Electronically Signed   By: Ruthann Cancer MD   On: 08/12/2020 16:31    EKG: Independently reviewed.  A. fib with unclear ventricular conduction  delay and expected QRS T wave discordance.  Compared to prior no changes.  Assessment/Plan Active Problems:   CAD (coronary artery disease)   HTN (hypertension)   Chronic atrial fibrillation (HCC)   Chronic systolic heart failure (HCC)   Stage 3 chronic kidney disease (HCC)   MDS (myelodysplastic syndrome), low grade (HCC)   GI bleed   Andres Gutierrez is a 87 y.o. male with history of chronic anemia and intermittent thrombocytopenia, MDS, chronic A. fib, chronic systolic heart failure, CKD, CAD, hypertension, who presents with dark stools concerning for upper GI bleed.  #GI bleed #Chronic anemia #Fatigue Patient presenting with melanotic stools and guaiac positive in the ED, concerning for upper GI bleed.  Evaluated for the same in 2017 with only esophagitis and gastritis found on upper endoscopy.  Suspect his fatigue is related to worsening of his anemia, does not have any signs of heart failure on exam.  Only likely contributor to bleeding is daily baby aspirin.  Discussed with Dr. Alice Reichert from GI, no plan for acute intervention, GI will see in the morning. -PPI twice daily -Trend CBC every 12 hours -Maintain active type and screen -Maintain 2 IVs -Follow-up GI recommendations  #Known medical problems CAD-hold home aspirin.  Continue ranolazine Hyperlipidemia-continue atorvastatin A. fib-continue digoxin GERD-already on PPI twice daily.  Hold Pepcid and Carafate CHF-continue home metoprolol BPH-continue Flomax  Code Status: Full code confirmed DVT Prophylaxis: SCDs Family Communication: Daughter updated at bedside Disposition Plan: Inpatient, MedSurg  Time spent: 58 min  Clarnce Flock MD/[facility]  Note:  This document was prepared using Systems analyst and may include  unintentional dictation errors.

## 2020-08-13 DIAGNOSIS — I5022 Chronic systolic (congestive) heart failure: Secondary | ICD-10-CM

## 2020-08-13 DIAGNOSIS — I10 Essential (primary) hypertension: Secondary | ICD-10-CM | POA: Diagnosis not present

## 2020-08-13 DIAGNOSIS — I482 Chronic atrial fibrillation, unspecified: Secondary | ICD-10-CM | POA: Diagnosis not present

## 2020-08-13 DIAGNOSIS — N1832 Chronic kidney disease, stage 3b: Secondary | ICD-10-CM

## 2020-08-13 DIAGNOSIS — K922 Gastrointestinal hemorrhage, unspecified: Secondary | ICD-10-CM | POA: Diagnosis not present

## 2020-08-13 LAB — CBC
HCT: 24.9 % — ABNORMAL LOW (ref 39.0–52.0)
Hemoglobin: 8.1 g/dL — ABNORMAL LOW (ref 13.0–17.0)
MCH: 32.5 pg (ref 26.0–34.0)
MCHC: 32.5 g/dL (ref 30.0–36.0)
MCV: 100 fL (ref 80.0–100.0)
Platelets: 109 10*3/uL — ABNORMAL LOW (ref 150–400)
RBC: 2.49 MIL/uL — ABNORMAL LOW (ref 4.22–5.81)
RDW: 18.1 % — ABNORMAL HIGH (ref 11.5–15.5)
WBC: 6.7 10*3/uL (ref 4.0–10.5)
nRBC: 0 % (ref 0.0–0.2)

## 2020-08-13 LAB — COMPREHENSIVE METABOLIC PANEL
ALT: 10 U/L (ref 0–44)
AST: 19 U/L (ref 15–41)
Albumin: 3.6 g/dL (ref 3.5–5.0)
Alkaline Phosphatase: 52 U/L (ref 38–126)
Anion gap: 7 (ref 5–15)
BUN: 22 mg/dL (ref 8–23)
CO2: 19 mmol/L — ABNORMAL LOW (ref 22–32)
Calcium: 8.3 mg/dL — ABNORMAL LOW (ref 8.9–10.3)
Chloride: 107 mmol/L (ref 98–111)
Creatinine, Ser: 1.53 mg/dL — ABNORMAL HIGH (ref 0.61–1.24)
GFR, Estimated: 44 mL/min — ABNORMAL LOW (ref >60)
Glucose, Bld: 86 mg/dL (ref 70–99)
Potassium: 4.2 mmol/L (ref 3.5–5.1)
Sodium: 133 mmol/L — ABNORMAL LOW (ref 135–145)
Total Bilirubin: 0.9 mg/dL (ref 0.3–1.2)
Total Protein: 6.2 g/dL — ABNORMAL LOW (ref 6.5–8.1)

## 2020-08-13 MED ORDER — PANTOPRAZOLE SODIUM 40 MG IV SOLR
40.0000 mg | Freq: Two times a day (BID) | INTRAVENOUS | Status: DC
  Administered 2020-08-13 – 2020-08-17 (×9): 40 mg via INTRAVENOUS
  Filled 2020-08-13 (×9): qty 40

## 2020-08-13 NOTE — Consult Note (Signed)
GI Inpatient Consult Note  Reason for Consult: Melena   Attending Requesting Consult: Dr. Clayton Lefort, MD  History of Present Illness: Andres Gutierrez is a 87 y.o. male seen for evaluation of melena and presumed GI bleed at the request of Dr. Clayton Lefort. Pt has a PMH of HTN, CKD, chronic systolic heart failure, chronic atrial fibrillation, cerebral amyloid angiopathy, MDS, chronic anemia and thrombocytopenia, and GERD. He presented to the  [facility] ED yesterday afternoon at the advise of his oncologist, Dr. Rogue Bussing, after reporting black watery stools, tarry-like ongoing since Wednesday. He reports he had two episodes of black watery stool on Wednesday and 2 on Thursday. He was also feeling weaker and more fatigued. He reports he had been off of his Protonix for about a week. He denies any fever, nausea, vomiting, abdominal pain, or bright red blood per rectum. Upon presentation to the ED, patient had hemoglobin 7.8 which was 8.4 last week and three weeks ago was 9.2. Patient follows as an outpatient with Dr. Rogue Bussing for anemia of chronic disease and has been getting IV iron infusions and Darbepoetin injections. Last infusion 03/25 last week. Per ED physician, he had brown stool in rectal vault but was guaiac positive. He reports he does not use excessive NSAIDs and only takes baby aspirin daily. He reports he has not taken any doses of Pepto-Bismol. He reports similar presentation with black stools back in 2017 where he underwent work-up with EGD which showed LA Grade B reflux esophagitis and mild gastritis without any evidence for bleeding. He denies any significant reflux or indigestion symptoms. No complains of esophageal dysphagia, odynophagia, early satiety, or unintentional weight loss. He was started on Protonix gtt and given 1 unit or pRBCs.    Past Medical History:  Past Medical History:  Diagnosis Date  . Anemia   . Anginal pain ([place])   . Anxiety   . Cardiomyopathy ([place])   .  Cerebral amyloid angiopathy ([place])   . CHF (congestive heart failure) ([place])   . Chronic kidney disease    kidney stones  . Coronary artery disease   . Cough   . Dyspnea   . GERD (gastroesophageal reflux disease)   . History of kidney stones   . Hypertension   . Lymphadenopathy, hilar   . MDS (myelodysplastic syndrome) ([place])   . MI (myocardial infarction) ([place])    x 2  . Wheezing     Problem List: Patient Active Problem List   Diagnosis Date Noted  . GI bleed 08/12/2020  . MDS (myelodysplastic syndrome), low grade ([place]) 07/22/2020  . Cerebral amyloid angiopathy ([place]) 03/05/2020  . Abdominal pain 03/03/2020  . HLD (hyperlipidemia) 03/03/2020  . Blurry vision 03/03/2020  . CKD (chronic kidney disease), stage IIIa 03/03/2020  . Left ureteral stone 03/03/2020  . Generalized weakness   . Cerebral hemorrhage, nontraumatic (HCC) 03/02/2020  . Chronic cholecystitis 11/06/2019  . Malnutrition of moderate degree 07/30/2019  . Acute neutrophilia 07/30/2019  . Sepsis ([place]) 07/29/2019  . Acute cholecystitis 07/29/2019  . Foot infection   . Acute on chronic heart failure ([place]) 06/07/2019  . Weakness   . NSTEMI (non-ST elevated myocardial infarction) ([place])   . Orthostatic hypotension 01/07/2019  . Macrocytic anemia 08/05/2018  . Anemia of chronic kidney failure, stage 3 (moderate) ([place]) 08/05/2018  . BPH (benign prostatic hyperplasia) 07/24/2018  . Gastroesophageal reflux disease with esophagitis 07/24/2018  . Stage 3 chronic kidney disease ([place]) 07/24/2018  . Thrombocytopenia ([place]) 07/24/2018  . Primary osteoarthritis  of both knees 08/22/2017  . Medicare annual wellness visit, initial 11/27/2016  . Chronic systolic heart failure ([place]) 01/17/2016  . Chewing tobacco use 01/17/2016  . Atrial fibrillation with RVR ([place]) 12/23/2015  . Dyspnea on exertion 12/23/2015  . Elevated troponin 12/23/2015  . GERD (gastroesophageal reflux disease) 12/23/2015  . Iron deficiency anemia due to  chronic blood loss 12/20/2015  . Femoral neck fracture, left, closed, initial encounter 11/03/2015  . Chronic atrial fibrillation ([place])   . Coronary artery disease involving native coronary artery of native heart without angina pectoris   . Acalculous cholecystitis 10/29/2015  . B12 deficiency 08/03/2015  . Erosive esophagitis 08/03/2015  . Adenopathy   . CAD (coronary artery disease) 12/22/2014  . HTN (hypertension) 12/22/2014  . Cardiomyopathy, ischemic 07/27/2014    Past Surgical History: Past Surgical History:  Procedure Laterality Date  . BRONCHIAL NEEDLE ASPIRATION BIOPSY N/A 12/31/2014   Procedure: BRONCHIAL NEEDLE ASPIRATION BIOPSIES from carina;  Surgeon: Flora Lipps, MD;  Location: [facility] ORS;  Service: Cardiopulmonary;  Laterality: N/A;  . CARDIAC CATHETERIZATION N/A 12/28/2015   Procedure: Left Heart Cath and Cors/Grafts Angiography;  Surgeon: Yolonda Kida, MD;  Location: [facility];  Service: Cardiovascular;  Laterality: N/A;  . CATARACT EXTRACTION W/PHACO Left 08/01/2016   Procedure: CATARACT EXTRACTION PHACO AND INTRAOCULAR LENS PLACEMENT ([place]) Left;  Surgeon: Leandrew Koyanagi, MD;  Location: [facility];  Service: Ophthalmology;  Laterality: Left;  . CHOLECYSTECTOMY    . COLON SURGERY    . CORONARY ANGIOPLASTY WITH STENT PLACEMENT    . CORONARY ARTERY BYPASS GRAFT    . CYSTOSCOPY/URETEROSCOPY/HOLMIUM LASER/STENT PLACEMENT Left 03/18/2020   Procedure: CYSTOSCOPY/URETEROSCOPY/HOLMIUM LASER/STENT PLACEMENT;  Surgeon: Billey Co, MD;  Location: [facility] ORS;  Service: Urology;  Laterality: Left;  . ENDOBRONCHIAL ULTRASOUND N/A 12/31/2014   Procedure: ENDOBRONCHIAL ULTRASOUND;  Surgeon: Flora Lipps, MD;  Location: [facility] ORS;  Service: Cardiopulmonary;  Laterality: N/A;  . ESOPHAGOGASTRODUODENOSCOPY (EGD) WITH PROPOFOL N/A 06/20/2015   Procedure: ESOPHAGOGASTRODUODENOSCOPY (EGD) WITH PROPOFOL;  Surgeon: Lollie Sails, MD;  Location: [facility] ENDOSCOPY;   Service: Endoscopy;  Laterality: N/A;  . IR CATHETER TUBE CHANGE  10/26/2019  . IR CHOLANGIOGRAM EXISTING TUBE  08/13/2019    Allergies: Allergies  Allergen Reactions  . Levaquin [Levofloxacin]     [name], her daughter, said Levaquin should be avoided because when patient had Levaquin a few years ago, he had "a bad reaction".  She said he could not walk and could not lift up his feet.  . Escitalopram Other (See Comments)    Reaction:  Makes him feel faint, like he was going to have a heart attack.  . 5ht3 Receptor Antagonists   . Diltiazem Rash  . Diphenhydramine Hcl Other (See Comments)    Reaction:  Rash and fever a long time ago, but has had it since with no problem.  . Maxidex [Dexamethasone] Other (See Comments)    Reaction:  Unknown   . Prednisone Other (See Comments)    Pt states that this medication made him feel crazy.    . Serotonin Other (See Comments)    Tried 2 different types, lexapro and another one.  Reaction:  Made him feel like he was having a heart attack.   . Vytorin [Ezetimibe-Simvastatin] Other (See Comments)    Reaction:  Unknown   . Zocor [Simvastatin] Other (See Comments)    Reaction:  Unknown     Home Medications: Medications Prior to Admission  Medication Sig Dispense Refill Last Dose  .  Ascorbic Acid (VITAMIN C PO) Take 1 tablet by mouth daily.     Marland Kitchen aspirin 81 MG EC tablet Take 1 tablet (81 mg total) by mouth daily. RESUME 48HRS after surgery (Patient taking differently: Take 81 mg by mouth daily.) 30 tablet 12   . atorvastatin (LIPITOR) 40 MG tablet Take 1 tablet (40 mg total) by mouth daily with supper.     . digoxin (LANOXIN) 0.125 MG tablet Take 0.0625 mg by mouth daily.     . famotidine (PEPCID) 20 MG tablet Take 20 mg by mouth at bedtime.     . feeding supplement (ENSURE ENLIVE / ENSURE PLUS) LIQD Take 237 mLs by mouth 2 (two) times daily between meals. 10000 mL 0   . ferrous sulfate 325 (65 FE) MG tablet Take 1 tablet (325 mg total) by mouth every  Monday, Wednesday, and Friday. With supper     . furosemide (LASIX) 20 MG tablet Take 1 tablet (20 mg total) by mouth daily as needed (for weight gain of 3lbs in 1 day or 5 lbs in 2 days or shortness of breath or leg swelling). 30 tablet 0   . metoprolol tartrate (LOPRESSOR) 25 MG tablet Take 12.5 mg by mouth at bedtime.     . Multiple Vitamin (MULTIVITAMIN) tablet Take 1 tablet by mouth daily.      Marland Kitchen neomycin-bacitracin-polymyxin (NEOSPORIN) ointment Apply 1 application topically every other day. Wound care (foot area)     . pantoprazole (PROTONIX) 20 MG tablet Take 20 mg by mouth daily before breakfast.     . PRESCRIPTION MEDICATION      . ranolazine (RANEXA) 1000 MG SR tablet Take 500 mg by mouth 2 (two) times daily.      . sucralfate (CARAFATE) 1 G tablet Take 1 g by mouth 2 (two) times daily.      . tamsulosin (FLOMAX) 0.4 MG CAPS capsule Take 0.4 mg by mouth daily after supper.      . vitamin B-12 (CYANOCOBALAMIN) 500 MCG tablet Take 500 mcg by mouth daily.      Home medication reconciliation was completed with the patient.   Scheduled Inpatient Medications:   . atorvastatin  40 mg Oral Q supper  . digoxin  0.0625 mg Oral Daily  . metoprolol tartrate  12.5 mg Oral QHS  . pantoprazole  40 mg Intravenous Q12H  . ranolazine  500 mg Oral BID  . tamsulosin  0.4 mg Oral QPC supper  . vitamin B-12  500 mcg Oral Daily    Continuous Inpatient Infusions:     PRN Inpatient Medications:  acetaminophen **OR** acetaminophen, oxyCODONE, polyethylene glycol, promethazine, traZODone  Family History: family history includes CAD in his mother; Colon cancer in his father.  The patient's family history is negative for inflammatory bowel disorders, GI malignancy, or solid organ transplantation.  Social History:   reports that he quit smoking about 43 years ago. His smokeless tobacco use includes chew. He reports current alcohol use. He reports that he does not use drugs. The patient denies ETOH,  tobacco, or drug use.   Review of Systems: Constitutional: Weight is stable.  Eyes: No changes in vision. ENT: No oral lesions, sore throat.  GI: see HPI.  Heme/Lymph: No easy bruising.  CV: No chest pain.  GU: No hematuria.  Integumentary: No rashes.  Neuro: No headaches.  Psych: No depression/anxiety.  Endocrine: No heat/cold intolerance.  Allergic/Immunologic: No urticaria.  Resp: No cough, SOB.  Musculoskeletal: No joint swelling.    Physical  Examination: BP (!) 133/52 (BP Location: Left Arm)   Pulse (!) 57   Temp 97.6 F (36.4 C) (Oral)   Resp 20   Ht 6\' 4"  (1.93 m)   Wt 82.6 kg   SpO2 96%   BMI 22.15 kg/m  Gen: NAD, alert and oriented x 4 HEENT: PEERLA, EOMI, Neck: supple, no JVD or thyromegaly Chest: CTA bilaterally, no wheezes, crackles, or other adventitious sounds CV: RRR, no m/g/c/r Abd: soft, NT, ND, +BS in all four quadrants; no HSM, guarding, ridigity, or rebound tenderness Ext: no edema, well perfused with 2+ pulses, Skin: no rash or lesions noted Lymph: no LAD  Data: Lab Results  Component Value Date   WBC 6.7 08/13/2020   HGB 8.1 (L) 08/13/2020   HCT 24.9 (L) 08/13/2020   MCV 100.0 08/13/2020   PLT 109 (L) 08/13/2020   Recent Labs  Lab 08/12/20 1553 08/13/20 0532  HGB 7.8* 8.1*   Lab Results  Component Value Date   NA 133 (L) 08/13/2020   K 4.2 08/13/2020   CL 107 08/13/2020   CO2 19 (L) 08/13/2020   BUN 22 08/13/2020   CREATININE 1.53 (H) 08/13/2020   Lab Results  Component Value Date   ALT 10 08/13/2020   AST 19 08/13/2020   ALKPHOS 52 08/13/2020   BILITOT 0.9 08/13/2020   Recent Labs  Lab 08/12/20 1553  INR 1.2   Assessment/Plan:  87 y/o Caucasian male with a PMH of PMH of HTN, CKD, chronic systolic heart failure, chronic atrial fibrillation, cerebral amyloid angiopathy, MDS, chronic anemia and thrombocytopenia, and GERD admitted to [facility] for symptomatic anemia 2/2 melena and concern for UGI bleed  1. Melena/Symptomatic  anemia  2. Anemia of chronic disease/IDA  3. GERD - on Protonix 40 mg twice daily at home  4. Chronic atrial fibrillation - no anticoagulation   -3-day history of presumed melanotic stools with drop in hemoglobin to 7.8 decreased from 9.2 three weeks ago is concerning for slow, chronic GI bleed. He had brown stool on rectal exam, but was guaiac positive -Differential includes peptic ulcer disease, gastritis, AVMs, Dieulafoy's lesion, esophagitis, gastritis, GAVE, malignancy, etc -He is s/p 1 unit pRBCs with post-transfusion hgb 8.1  Recommendations:  - Continue to monitor H&H closely and transfuse for hgb <7.0. - Maintain 2 large bore IVs - Agree with Protonix gtt for gastric protection - Continue serial abdominal examinations - Full liquid diet - No urgent endoscopy indicated at this time since no overt bleeding or profound anemia. Plan EGD electively likely Monday unless he has acute bleeding or significant hemodynamic changes - Following    Thank you for the consult. Please call with questions or concerns.  [facility] For[facility] [phone] (Cell)

## 2020-08-13 NOTE — Progress Notes (Signed)
PROGRESS NOTE    [name]  IRW:[id number] DOB: Feb 09, 1931 DOA: 08/12/2020 PCP: Rusty Aus, MD   Brief Narrative: Andres Gutierrez is a 87 y.o. male with a history of anemia, thrombocytopenia, MDS, atrial fibrillation, heart failure, CKD, CAD, hypertension. Patient presented secondary to dark stools and found to have evidence of melena with guaiac positive stools concerning for upper GI bleeding. GI consulted.   Assessment & Plan:   Active Problems:   CAD (coronary artery disease)   HTN (hypertension)   Chronic atrial fibrillation (HCC)   Chronic systolic heart failure (HCC)   Stage 3 chronic kidney disease (HCC)   MDS (myelodysplastic syndrome), low grade (HCC)   GI bleed   Melena GI Bleed Melena with guaiac positive stools. Patient with a history of esophagitis/gastritis. Associated symptomatic anemia. GI consulted on admission. -GI recommendations: EGD in two days -Continue Protonix IV  Symptomatic anemia Acute blood loss anemia Chronic anemia Baseline hemoglobin of 8-9. Hemoglobin of 7.8 on admission with symptoms of fatigue; patient transfused 1 unit of PRBC in the ED. -Trend hemoglobin/CBC  CAD Patient is on aspirin as an outpatient which was held on admission secondary to above. No chest pain.  Chronic atrial fibrillation Patient is on metoprolol, digoxin as an outpatient. Not currently on anticoagulation. -Continue metoprolol and digoxin  GERD PPI as mentioned above.  Chronic combined systolic and diastolic heart failure Patient is on metoprolol and digoxin. He is also prescribed as needed lasix. Last EF of 35-40% from Transthoracic Echocardiogram on 10/21 with associated grade 1 diastolic dysfunction. Currently stable. -Continue metoprolol and digoxin -Daily weights and strict in/out  BPH -Continue Flomax  CKD stage IIIb Stable.   DVT prophylaxis: SCDs Code Status:   Code Status: Full Code Family Communication: Daughter at bedside Disposition  Plan: Discharge in 2-3 days pending GI management   Consultants:   Gastroenterology  Procedures:   None  Antimicrobials:  None    Subjective: No melena noted overnight or this morning. No concerns.  Objective: Vitals:   08/13/20 0511 08/13/20 0749 08/13/20 1133 08/13/20 1557  BP: (!) 105/58 122/68 (!) 133/52 (!) 125/58  Pulse: 64 67 (!) 57 (!) 59  Resp:  17 20 16   Temp:  97.6 F (36.4 C) 97.6 F (36.4 C) 98 F (36.7 C)  TempSrc:  Oral Oral Oral  SpO2:  95% 96% 98%  Weight:      Height:        Intake/Output Summary (Last 24 hours) at 08/13/2020 1633 Last data filed at 08/13/2020 1400 Gross per 24 hour  Intake 2432.17 ml  Output 950 ml  Net 1482.17 ml   Filed Weights   08/12/20 1547  Weight: 82.6 kg    Examination:  General exam: Appears calm and comfortable Respiratory system: Clear to auscultation. Respiratory effort normal. Cardiovascular system: S1 & S2 heard, RRR. 3/6 systolic murmur Gastrointestinal system: Abdomen is nondistended, soft and nontender. No organomegaly or masses felt. Normal bowel sounds heard. Central nervous system: Alert and oriented. No focal neurological deficits. Musculoskeletal: No edema. No calf tenderness Skin: No cyanosis. No rashes Psychiatry: Judgement and insight appear normal. Mood & affect appropriate.     Data Reviewed: I have personally reviewed following labs and imaging studies  CBC Lab Results  Component Value Date   WBC 6.7 08/13/2020   RBC 2.49 (L) 08/13/2020   HGB 8.1 (L) 08/13/2020   HCT 24.9 (L) 08/13/2020   MCV 100.0 08/13/2020   MCH 32.5 08/13/2020  PLT 109 (L) 08/13/2020   MCHC 32.5 08/13/2020   RDW 18.1 (H) 08/13/2020   LYMPHSABS 2.7 08/05/2020   MONOABS 2.7 (H) 08/05/2020   EOSABS 0.0 08/05/2020   BASOSABS 0.0 52/84/1324     Last metabolic panel Lab Results  Component Value Date   NA 133 (L) 08/13/2020   K 4.2 08/13/2020   CL 107 08/13/2020   CO2 19 (L) 08/13/2020   BUN 22 08/13/2020    CREATININE 1.53 (H) 08/13/2020   GLUCOSE 86 08/13/2020   GFRNONAA 44 (L) 08/13/2020   GFRAA 45 (L) 01/07/2020   CALCIUM 8.3 (L) 08/13/2020   PHOS 3.3 07/31/2019   PROT 6.2 (L) 08/13/2020   ALBUMIN 3.6 08/13/2020   LABGLOB 3.1 08/02/2015   BILITOT 0.9 08/13/2020   ALKPHOS 52 08/13/2020   AST 19 08/13/2020   ALT 10 08/13/2020   ANIONGAP 7 08/13/2020    CBG (last 3)  No results for input(s): GLUCAP in the last 72 hours.   GFR: Estimated Creatinine Clearance: 39.7 mL/min (A) (by C-G formula based on SCr of 1.53 mg/dL (H)).  Coagulation Profile: Recent Labs  Lab 08/12/20 1553  INR 1.2    Recent Results (from the past 240 hour(s))  Resp Panel by RT-PCR (Flu A&B, Covid) Nasopharyngeal Swab     Status: None   Collection Time: 08/12/20  7:24 PM   Specimen: Nasopharyngeal Swab; Nasopharyngeal(NP) swabs in vial transport medium  Result Value Ref Range Status   SARS Coronavirus 2 by RT PCR NEGATIVE NEGATIVE Final    Comment: (NOTE) SARS-CoV-2 target nucleic acids are NOT DETECTED.  The SARS-CoV-2 RNA is generally detectable in upper respiratory specimens during the acute phase of infection. The lowest concentration of SARS-CoV-2 viral copies this assay can detect is 138 copies/mL. A negative result does not preclude SARS-Cov-2 infection and should not be used as the sole basis for treatment or other patient management decisions. A negative result may occur with  improper specimen collection/handling, submission of specimen other than nasopharyngeal swab, presence of viral mutation(s) within the areas targeted by this assay, and inadequate number of viral copies(<138 copies/mL). A negative result must be combined with clinical observations, patient history, and epidemiological information. The expected result is Negative.  Fact Sheet for Patients:  EntrepreneurPulse.com.au  Fact Sheet for Healthcare Providers:   IncredibleEmployment.be  This test is no t yet approved or cleared by the [place] FDA and  has been authorized for detection and/or diagnosis of SARS-CoV-2 by FDA under an Emergency Use Authorization (EUA). This EUA will remain  in effect (meaning this test can be used) for the duration of the COVID-19 declaration under Section 564(b)(1) of the Act, 21 U.S.C.section 360bbb-3(b)(1), unless the authorization is terminated  or revoked sooner.       Influenza A by PCR NEGATIVE NEGATIVE Final   Influenza B by PCR NEGATIVE NEGATIVE Final    Comment: (NOTE) The Xpert Xpress SARS-CoV-2/FLU/RSV plus assay is intended as an aid in the diagnosis of influenza from Nasopharyngeal swab specimens and should not be used as a sole basis for treatment. Nasal washings and aspirates are unacceptable for Xpert Xpress SARS-CoV-2/FLU/RSV testing.  Fact Sheet for Patients: EntrepreneurPulse.com.au  Fact Sheet for Healthcare Providers: IncredibleEmployment.be  This test is not yet approved or cleared by the [place] FDA and has been authorized for detection and/or diagnosis of SARS-CoV-2 by FDA under an Emergency Use Authorization (EUA). This EUA will remain in effect (meaning this test can be used) for the duration  of the COVID-19 declaration under Section 564(b)(1) of the Act, 21 U.S.C. section 360bbb-3(b)(1), unless the authorization is terminated or revoked.  Performed at [facility], [address], [place], [place] [address]         Radiology Studies: DG Chest 2 View  Result Date: 08/12/2020 CLINICAL DATA:  87 year old male with shortness of breath. EXAM: CHEST - 2 VIEW COMPARISON:  07/18/2020 FINDINGS: The mediastinal contours are within normal limits. No cardiomegaly. Postsurgical changes after coronary artery bypass graft. Flattening of the hemidiaphragms bilaterally, similar to comparison. Mild blunting of the  costophrenic angles bilaterally. Unchanged cavitary lesion in the left apex. No focal consolidation, pleural effusion, or pneumothorax. No acute osseous abnormality. IMPRESSION: No acute cardiopulmonary process. Similar appearing emphysematous changes. Electronically Signed   By: Ruthann Cancer MD   On: 08/12/2020 16:31        Scheduled Meds: . atorvastatin  40 mg Oral Q supper  . digoxin  0.0625 mg Oral Daily  . metoprolol tartrate  12.5 mg Oral QHS  . pantoprazole  40 mg Intravenous Q12H  . ranolazine  500 mg Oral BID  . tamsulosin  0.4 mg Oral QPC supper  . vitamin B-12  500 mcg Oral Daily   Continuous Infusions:   LOS: 0 days     Cordelia Poche, MD [facility] 08/13/2020, 4:33 PM  If 7PM-7AM, please contact night-coverage [email or web address]

## 2020-08-14 ENCOUNTER — Observation Stay: Payer: PPO

## 2020-08-14 DIAGNOSIS — I482 Chronic atrial fibrillation, unspecified: Secondary | ICD-10-CM | POA: Diagnosis not present

## 2020-08-14 DIAGNOSIS — K922 Gastrointestinal hemorrhage, unspecified: Secondary | ICD-10-CM | POA: Diagnosis not present

## 2020-08-14 DIAGNOSIS — I5022 Chronic systolic (congestive) heart failure: Secondary | ICD-10-CM | POA: Diagnosis not present

## 2020-08-14 DIAGNOSIS — I2510 Atherosclerotic heart disease of native coronary artery without angina pectoris: Secondary | ICD-10-CM | POA: Diagnosis not present

## 2020-08-14 DIAGNOSIS — R230 Cyanosis: Secondary | ICD-10-CM | POA: Diagnosis not present

## 2020-08-14 LAB — CBC
HCT: 24.8 % — ABNORMAL LOW (ref 39.0–52.0)
Hemoglobin: 7.9 g/dL — ABNORMAL LOW (ref 13.0–17.0)
MCH: 32.1 pg (ref 26.0–34.0)
MCHC: 31.9 g/dL (ref 30.0–36.0)
MCV: 100.8 fL — ABNORMAL HIGH (ref 80.0–100.0)
Platelets: 127 10*3/uL — ABNORMAL LOW (ref 150–400)
RBC: 2.46 MIL/uL — ABNORMAL LOW (ref 4.22–5.81)
RDW: 18 % — ABNORMAL HIGH (ref 11.5–15.5)
WBC: 6.6 10*3/uL (ref 4.0–10.5)
nRBC: 0 % (ref 0.0–0.2)

## 2020-08-14 LAB — HEMOGLOBIN AND HEMATOCRIT, BLOOD
HCT: 31.4 % — ABNORMAL LOW (ref 39.0–52.0)
Hemoglobin: 10.1 g/dL — ABNORMAL LOW (ref 13.0–17.0)

## 2020-08-14 LAB — PREPARE RBC (CROSSMATCH)

## 2020-08-14 MED ORDER — SODIUM CHLORIDE 0.9% IV SOLUTION: Freq: Once | INTRAVENOUS | Status: AC

## 2020-08-14 NOTE — H&P (View-Only) (Signed)
GI Inpatient Follow-up Note  Subjective:  Patient seen in follow-up for melena and presumed upper GI bleed. No acute events overnight. He had BM yesterday which was brown with no overt bleeding. He is tolerating clears without difficulty. No abdominal pain, nausea, or vomiting. He is wearing O2 for comfort. Daughter present bedside. Hemoglobin 7.9 this morning.   Scheduled Inpatient Medications:  . sodium chloride   Intravenous Once  . atorvastatin  40 mg Oral Q supper  . digoxin  0.0625 mg Oral Daily  . metoprolol tartrate  12.5 mg Oral QHS  . pantoprazole  40 mg Intravenous Q12H  . ranolazine  500 mg Oral BID  . tamsulosin  0.4 mg Oral QPC supper  . vitamin B-12  500 mcg Oral Daily    Continuous Inpatient Infusions:    PRN Inpatient Medications:  acetaminophen **OR** acetaminophen, oxyCODONE, polyethylene glycol, promethazine, traZODone  Review of Systems: Constitutional: Weight is stable.  Eyes: No changes in vision. ENT: No oral lesions, sore throat.  GI: see HPI.  Heme/Lymph: No easy bruising.  CV: No chest pain.  GU: No hematuria.  Integumentary: No rashes.  Neuro: No headaches.  Psych: No depression/anxiety.  Endocrine: No heat/cold intolerance.  Allergic/Immunologic: No urticaria.  Resp: No cough, SOB.  Musculoskeletal: No joint swelling.    Physical Examination: BP 128/63 (BP Location: Left Arm)   Pulse 65   Temp 97.6 F (36.4 C) (Oral)   Resp 20   Ht 6\' 4"  (1.93 m)   Wt 86.7 kg   SpO2 99%   BMI 23.26 kg/m  Gen: NAD, alert and oriented x 4 HEENT: PEERLA, EOMI, Neck: supple, no JVD or thyromegaly Chest: CTA bilaterally, no wheezes, crackles, or other adventitious sounds CV: RRR, no m/g/c/r Abd: soft, NT, ND, +BS in all four quadrants; no HSM, guarding, ridigity, or rebound tenderness Ext: no edema, well perfused with 2+ pulses, Skin: no rash or lesions noted Lymph: no LAD  Data: Lab Results  Component Value Date   WBC 6.6 08/14/2020   HGB 7.9  (L) 08/14/2020   HCT 24.8 (L) 08/14/2020   MCV 100.8 (H) 08/14/2020   PLT 127 (L) 08/14/2020   Recent Labs  Lab 08/12/20 1553 08/13/20 0532 08/14/20 0557  HGB 7.8* 8.1* 7.9*   Lab Results  Component Value Date   NA 133 (L) 08/13/2020   K 4.2 08/13/2020   CL 107 08/13/2020   CO2 19 (L) 08/13/2020   BUN 22 08/13/2020   CREATININE 1.53 (H) 08/13/2020   Lab Results  Component Value Date   ALT 10 08/13/2020   AST 19 08/13/2020   ALKPHOS 52 08/13/2020   BILITOT 0.9 08/13/2020   Recent Labs  Lab 08/12/20 1553  INR 1.2   Assessment/Plan:  87 y/o Caucasian male with a PMH of PMH of HTN, CKD, chronic systolic heart failure, chronic atrial fibrillation, cerebral amyloid angiopathy, MDS, chronic anemia and thrombocytopenia, and GERD admitted to [facility] for symptomatic anemia 2/2 melena and concern for UGI bleed  1. Melena/Symptomatic anemia  2. Anemia of chronic disease/IDA  3. GERD - on Protonix 40 mg twice daily at home  4. Chronic atrial fibrillation - no anticoagulation   -3-day history of presumed melanotic stools with drop in hemoglobin to 7.8 decreased from 9.2 three weeks ago is concerning for slow, chronic GI bleed. He had brown stool on rectal exam, but was guaiac positive -No recurrent bleeding. Hemoglobin 7.9 this morning.  -Differential includes peptic ulcer disease, gastritis, AVMs, Dieulafoy's lesion, esophagitis,  gastritis, GAVE, malignancy, etc -He is s/p 1 unit pRBCs with post-transfusion hgb 8.1 yesterday  Recommendations:   - Continue to monitor H&H closely - Transfuse 1 unit pRBC today - Continue Protonix for gastric protection - EGD tomorrow with Dr. Alice Reichert - Advance diet to soft food diet. NPO after midnight. - Further recommendations after procedure  I reviewed the risks (including bleeding, perforation, infection, anesthesia complications, cardiac/respiratory complications), benefits and alternatives of EGD. Patient consents to proceed.     Please call with questions or concerns.    Octavia Bruckner, PA-C [facility] [phone] (Cell)

## 2020-08-14 NOTE — Progress Notes (Signed)
GI Inpatient Follow-up Note  Subjective:  Patient seen in follow-up for melena and presumed upper GI bleed. No acute events overnight. He had BM yesterday which was brown with no overt bleeding. He is tolerating clears without difficulty. No abdominal pain, nausea, or vomiting. He is wearing O2 for comfort. Daughter present bedside. Hemoglobin 7.9 this morning.   Scheduled Inpatient Medications:  . sodium chloride   Intravenous Once  . atorvastatin  40 mg Oral Q supper  . digoxin  0.0625 mg Oral Daily  . metoprolol tartrate  12.5 mg Oral QHS  . pantoprazole  40 mg Intravenous Q12H  . ranolazine  500 mg Oral BID  . tamsulosin  0.4 mg Oral QPC supper  . vitamin B-12  500 mcg Oral Daily    Continuous Inpatient Infusions:    PRN Inpatient Medications:  acetaminophen **OR** acetaminophen, oxyCODONE, polyethylene glycol, promethazine, traZODone  Review of Systems: Constitutional: Weight is stable.  Eyes: No changes in vision. ENT: No oral lesions, sore throat.  GI: see HPI.  Heme/Lymph: No easy bruising.  CV: No chest pain.  GU: No hematuria.  Integumentary: No rashes.  Neuro: No headaches.  Psych: No depression/anxiety.  Endocrine: No heat/cold intolerance.  Allergic/Immunologic: No urticaria.  Resp: No cough, SOB.  Musculoskeletal: No joint swelling.    Physical Examination: BP 128/63 (BP Location: Left Arm)   Pulse 65   Temp 97.6 F (36.4 C) (Oral)   Resp 20   Ht 6\' 4"  (1.93 m)   Wt 86.7 kg   SpO2 99%   BMI 23.26 kg/m  Gen: NAD, alert and oriented x 4 HEENT: PEERLA, EOMI, Neck: supple, no JVD or thyromegaly Chest: CTA bilaterally, no wheezes, crackles, or other adventitious sounds CV: RRR, no m/g/c/r Abd: soft, NT, ND, +BS in all four quadrants; no HSM, guarding, ridigity, or rebound tenderness Ext: no edema, well perfused with 2+ pulses, Skin: no rash or lesions noted Lymph: no LAD  Data: Lab Results  Component Value Date   WBC 6.6 08/14/2020   HGB 7.9  (L) 08/14/2020   HCT 24.8 (L) 08/14/2020   MCV 100.8 (H) 08/14/2020   PLT 127 (L) 08/14/2020   Recent Labs  Lab 08/12/20 1553 08/13/20 0532 08/14/20 0557  HGB 7.8* 8.1* 7.9*   Lab Results  Component Value Date   NA 133 (L) 08/13/2020   K 4.2 08/13/2020   CL 107 08/13/2020   CO2 19 (L) 08/13/2020   BUN 22 08/13/2020   CREATININE 1.53 (H) 08/13/2020   Lab Results  Component Value Date   ALT 10 08/13/2020   AST 19 08/13/2020   ALKPHOS 52 08/13/2020   BILITOT 0.9 08/13/2020   Recent Labs  Lab 08/12/20 1553  INR 1.2   Assessment/Plan:  87 y/o Caucasian male with a PMH of PMH of HTN, CKD, chronic systolic heart failure, chronic atrial fibrillation, cerebral amyloid angiopathy, MDS, chronic anemia and thrombocytopenia, and GERD admitted to [facility] for symptomatic anemia 2/2 melena and concern for UGI bleed  1. Melena/Symptomatic anemia  2. Anemia of chronic disease/IDA  3. GERD - on Protonix 40 mg twice daily at home  4. Chronic atrial fibrillation - no anticoagulation   -3-day history of presumed melanotic stools with drop in hemoglobin to 7.8 decreased from 9.2 three weeks ago is concerning for slow, chronic GI bleed. He had brown stool on rectal exam, but was guaiac positive -No recurrent bleeding. Hemoglobin 7.9 this morning.  -Differential includes peptic ulcer disease, gastritis, AVMs, Dieulafoy's lesion, esophagitis,  gastritis, GAVE, malignancy, etc -He is s/p 1 unit pRBCs with post-transfusion hgb 8.1 yesterday  Recommendations:   - Continue to monitor H&H closely - Transfuse 1 unit pRBC today - Continue Protonix for gastric protection - EGD tomorrow with Dr. Alice Reichert - Advance diet to soft food diet. NPO after midnight. - Further recommendations after procedure  I reviewed the risks (including bleeding, perforation, infection, anesthesia complications, cardiac/respiratory complications), benefits and alternatives of EGD. Patient consents to proceed.     Please call with questions or concerns.    Octavia Bruckner, PA-C [facility] [phone] (Cell)

## 2020-08-14 NOTE — Plan of Care (Signed)
On 2L  for the pt c/o of dyspnea with exertion. Vitals stable. Safety measures in place. Will continue to monitor.   Problem: Education: Goal: Knowledge of General Education information will improve Description: Including pain rating scale, medication(s)/side effects and non-pharmacologic comfort measures Outcome: Progressing   Problem: Health Behavior/Discharge Planning: Goal: Ability to manage health-related needs will improve Outcome: Progressing   Problem: Clinical Measurements: Goal: Ability to maintain clinical measurements within normal limits will improve Outcome: Progressing Goal: Will remain free from infection Outcome: Progressing Goal: Diagnostic test results will improve Outcome: Progressing Goal: Respiratory complications will improve Outcome: Progressing Goal: Cardiovascular complication will be avoided Outcome: Progressing   Problem: Nutrition: Goal: Adequate nutrition will be maintained Outcome: Progressing   Problem: Activity: Goal: Risk for activity intolerance will decrease Outcome: Progressing   Problem: Coping: Goal: Level of anxiety will decrease Outcome: Progressing   Problem: Elimination: Goal: Will not experience complications related to bowel motility Outcome: Progressing Goal: Will not experience complications related to urinary retention Outcome: Progressing   Problem: Safety: Goal: Ability to remain free from injury will improve Outcome: Progressing   Problem: Pain Managment: Goal: General experience of comfort will improve Outcome: Progressing   Problem: Safety: Goal: Ability to remain free from injury will improve Outcome: Progressing   Problem: Skin Integrity: Goal: Risk for impaired skin integrity will decrease Outcome: Progressing

## 2020-08-14 NOTE — Plan of Care (Signed)
Continuing with plan of care. 

## 2020-08-14 NOTE — Progress Notes (Signed)
PROGRESS NOTE    Andres Gutierrez  GUR:[id number] DOB: Apr 18, 1931 DOA: 08/12/2020 PCP: Rusty Aus, MD   Brief Narrative: UNNAMED Andres Gutierrez is a 87 y.o. male with a history of anemia, thrombocytopenia, MDS, atrial fibrillation, heart failure, CKD, CAD, hypertension. Patient presented secondary to dark stools and found to have evidence of melena with guaiac positive stools concerning for upper GI bleeding. GI consulted.   Assessment & Plan:   Active Problems:   CAD (coronary artery disease)   HTN (hypertension)   Chronic atrial fibrillation (HCC)   Chronic systolic heart failure (HCC)   Stage 3 chronic kidney disease (HCC)   MDS (myelodysplastic syndrome), low grade (HCC)   GI bleed   Melena GI Bleed Melena with guaiac positive stools. Patient with a history of esophagitis/gastritis. Associated symptomatic anemia. GI consulted on admission. -GI recommendations: EGD in tomorrow -Continue Protonix IV  Symptomatic anemia Acute blood loss anemia Chronic anemia Baseline hemoglobin of 8-9. Hemoglobin of 7.8 on admission with symptoms of fatigue; patient transfused 1 unit of PRBC in the ED. Hemoglobin trended down minimally but patient with recurrent symptoms of symptomatic anemia with DOE. -Trend hemoglobin/CBC -Transfuse 1 unit of PRBC  CAD Patient is on aspirin as an outpatient which was held on admission secondary to above. No chest pain.  Chronic atrial fibrillation Patient is on metoprolol, digoxin as an outpatient. Not currently on anticoagulation. -Continue metoprolol and digoxin  GERD PPI as mentioned above.  Chronic combined systolic and diastolic heart failure Patient is on metoprolol and digoxin. He is also prescribed as needed lasix. Last EF of 35-40% from Transthoracic Echocardiogram on 10/21 with associated grade 1 diastolic dysfunction. Currently stable. -Continue metoprolol and digoxin -Daily weights and strict in/out  BPH -Continue Flomax  CKD stage  IIIb Stable.  Right foot cyanosis Decreased DP and PD pulses of right foot. -ABI   DVT prophylaxis: SCDs Code Status:   Code Status: Full Code Family Communication: None at bedside Disposition Plan: Discharge in 1-3 days pending GI management   Consultants:   Gastroenterology  Procedures:   None  Antimicrobials:  None    Subjective: Patient reports dyspnea on exertion this morning. Feels poor.  Objective: Vitals:   08/13/20 1557 08/13/20 2051 08/13/20 2321 08/14/20 0453  BP: (!) 125/58 (!) 100/58 (!) 124/53 (!) 117/53  Pulse: (!) 59 64 65 62  Resp: 16 18 18 20   Temp: 98 F (36.7 C) (!) 97.3 F (36.3 C) (!) 97.4 F (36.3 C) 98 F (36.7 C)  TempSrc: Oral Oral Oral Oral  SpO2: 98% 100% 100% 99%  Weight:    86.7 kg  Height:        Intake/Output Summary (Last 24 hours) at 08/14/2020 1050 Last data filed at 08/14/2020 0309 Gross per 24 hour  Intake 1220 ml  Output 850 ml  Net 370 ml   Filed Weights   08/12/20 1547 08/14/20 0453  Weight: 82.6 kg 86.7 kg    Examination:  General exam: Appears calm and comfortable and in no acute distress. Conversant Respiratory: Clear to auscultation. Respiratory effort normal with no intercostal retractions or use of accessory muscles Cardiovascular: S1 & S2 heard, RRR. No murmurs, rubs, gallops or clicks. LE edema. Could not palpate right DP pulse Gastrointestinal: Abdomen is nondistended, soft and nontender. No masses felt. Normal bowel sounds heard Neurologic: No focal neurological deficits Musculoskeletal: No calf tenderness Skin: No cyanosis. No new rashes Psychiatry: Alert and oriented. Memory intact. Mood & affect appropriate  Data Reviewed: I have personally reviewed following labs and imaging studies  CBC Lab Results  Component Value Date   WBC 6.6 08/14/2020   RBC 2.46 (L) 08/14/2020   HGB 7.9 (L) 08/14/2020   HCT 24.8 (L) 08/14/2020   MCV 100.8 (H) 08/14/2020   MCH 32.1 08/14/2020   PLT 127 (L)  08/14/2020   MCHC 31.9 08/14/2020   RDW 18.0 (H) 08/14/2020   LYMPHSABS 2.7 08/05/2020   MONOABS 2.7 (H) 08/05/2020   EOSABS 0.0 08/05/2020   BASOSABS 0.0 46/65/9935     Last metabolic panel Lab Results  Component Value Date   NA 133 (L) 08/13/2020   K 4.2 08/13/2020   CL 107 08/13/2020   CO2 19 (L) 08/13/2020   BUN 22 08/13/2020   CREATININE 1.53 (H) 08/13/2020   GLUCOSE 86 08/13/2020   GFRNONAA 44 (L) 08/13/2020   GFRAA 45 (L) 01/07/2020   CALCIUM 8.3 (L) 08/13/2020   PHOS 3.3 07/31/2019   PROT 6.2 (L) 08/13/2020   ALBUMIN 3.6 08/13/2020   LABGLOB 3.1 08/02/2015   BILITOT 0.9 08/13/2020   ALKPHOS 52 08/13/2020   AST 19 08/13/2020   ALT 10 08/13/2020   ANIONGAP 7 08/13/2020    CBG (last 3)  No results for input(s): GLUCAP in the last 72 hours.   GFR: Estimated Creatinine Clearance: 41.7 mL/min (A) (by C-G formula based on SCr of 1.53 mg/dL (H)).  Coagulation Profile: Recent Labs  Lab 08/12/20 1553  INR 1.2    Recent Results (from the past 240 hour(s))  Resp Panel by RT-PCR (Flu A&B, Covid) Nasopharyngeal Swab     Status: None   Collection Time: 08/12/20  7:24 PM   Specimen: Nasopharyngeal Swab; Nasopharyngeal(NP) swabs in vial transport medium  Result Value Ref Range Status   SARS Coronavirus 2 by RT PCR NEGATIVE NEGATIVE Final    Comment: (NOTE) SARS-CoV-2 target nucleic acids are NOT DETECTED.  The SARS-CoV-2 RNA is generally detectable in upper respiratory specimens during the acute phase of infection. The lowest concentration of SARS-CoV-2 viral copies this assay can detect is 138 copies/mL. A negative result does not preclude SARS-Cov-2 infection and should not be used as the sole basis for treatment or other patient management decisions. A negative result may occur with  improper specimen collection/handling, submission of specimen other than nasopharyngeal swab, presence of viral mutation(s) within the areas targeted by this assay, and  inadequate number of viral copies(<138 copies/mL). A negative result must be combined with clinical observations, patient history, and epidemiological information. The expected result is Negative.  Fact Sheet for Patients:  EntrepreneurPulse.com.au  Fact Sheet for Healthcare Providers:  IncredibleEmployment.be  This test is no t yet approved or cleared by the [place] FDA and  has been authorized for detection and/or diagnosis of SARS-CoV-2 by FDA under an Emergency Use Authorization (EUA). This EUA will remain  in effect (meaning this test can be used) for the duration of the COVID-19 declaration under Section 564(b)(1) of the Act, 21 U.S.C.section 360bbb-3(b)(1), unless the authorization is terminated  or revoked sooner.       Influenza A by PCR NEGATIVE NEGATIVE Final   Influenza B by PCR NEGATIVE NEGATIVE Final    Comment: (NOTE) The Xpert Xpress SARS-CoV-2/FLU/RSV plus assay is intended as an aid in the diagnosis of influenza from Nasopharyngeal swab specimens and should not be used as a sole basis for treatment. Nasal washings and aspirates are unacceptable for Xpert Xpress SARS-CoV-2/FLU/RSV testing.  Fact Sheet for Patients:  EntrepreneurPulse.com.au  Fact Sheet for Healthcare Providers: IncredibleEmployment.be  This test is not yet approved or cleared by the [place] FDA and has been authorized for detection and/or diagnosis of SARS-CoV-2 by FDA under an Emergency Use Authorization (EUA). This EUA will remain in effect (meaning this test can be used) for the duration of the COVID-19 declaration under Section 564(b)(1) of the Act, 21 U.S.C. section 360bbb-3(b)(1), unless the authorization is terminated or revoked.  Performed at [facility], [address], [place], [place] [address]         Radiology Studies: DG Chest 2 View  Result Date: 08/12/2020 CLINICAL DATA:   87 year old male with shortness of breath. EXAM: CHEST - 2 VIEW COMPARISON:  07/18/2020 FINDINGS: The mediastinal contours are within normal limits. No cardiomegaly. Postsurgical changes after coronary artery bypass graft. Flattening of the hemidiaphragms bilaterally, similar to comparison. Mild blunting of the costophrenic angles bilaterally. Unchanged cavitary lesion in the left apex. No focal consolidation, pleural effusion, or pneumothorax. No acute osseous abnormality. IMPRESSION: No acute cardiopulmonary process. Similar appearing emphysematous changes. Electronically Signed   By: Ruthann Cancer MD   On: 08/12/2020 16:31        Scheduled Meds: . sodium chloride   Intravenous Once  . atorvastatin  40 mg Oral Q supper  . digoxin  0.0625 mg Oral Daily  . metoprolol tartrate  12.5 mg Oral QHS  . pantoprazole  40 mg Intravenous Q12H  . ranolazine  500 mg Oral BID  . tamsulosin  0.4 mg Oral QPC supper  . vitamin B-12  500 mcg Oral Daily   Continuous Infusions:   LOS: 0 days     Cordelia Poche, MD [facility] 08/14/2020, 10:50 AM  If 7PM-7AM, please contact night-coverage [email or web address]

## 2020-08-15 ENCOUNTER — Inpatient Hospital Stay: Payer: PPO | Admitting: Anesthesiology

## 2020-08-15 ENCOUNTER — Encounter
Admission: EM | Disposition: A | Discharge status: Home / Self Care | Payer: Self-pay | Source: Home / Self Care | Attending: Family Medicine

## 2020-08-15 DIAGNOSIS — K922 Gastrointestinal hemorrhage, unspecified: Secondary | ICD-10-CM | POA: Diagnosis present

## 2020-08-15 DIAGNOSIS — Z961 Presence of intraocular lens: Secondary | ICD-10-CM | POA: Diagnosis present

## 2020-08-15 DIAGNOSIS — Z8249 Family history of ischemic heart disease and other diseases of the circulatory system: Secondary | ICD-10-CM | POA: Diagnosis not present

## 2020-08-15 DIAGNOSIS — I130 Hypertensive heart and chronic kidney disease with heart failure and stage 1 through stage 4 chronic kidney disease, or unspecified chronic kidney disease: Secondary | ICD-10-CM | POA: Diagnosis present

## 2020-08-15 DIAGNOSIS — I482 Chronic atrial fibrillation, unspecified: Secondary | ICD-10-CM | POA: Diagnosis present

## 2020-08-15 DIAGNOSIS — E785 Hyperlipidemia, unspecified: Secondary | ICD-10-CM | POA: Diagnosis present

## 2020-08-15 DIAGNOSIS — I252 Old myocardial infarction: Secondary | ICD-10-CM | POA: Diagnosis not present

## 2020-08-15 DIAGNOSIS — Z20822 Contact with and (suspected) exposure to covid-19: Secondary | ICD-10-CM | POA: Diagnosis present

## 2020-08-15 DIAGNOSIS — E854 Organ-limited amyloidosis: Secondary | ICD-10-CM | POA: Diagnosis present

## 2020-08-15 DIAGNOSIS — K2970 Gastritis, unspecified, without bleeding: Secondary | ICD-10-CM | POA: Diagnosis present

## 2020-08-15 DIAGNOSIS — D649 Anemia, unspecified: Secondary | ICD-10-CM | POA: Diagnosis not present

## 2020-08-15 DIAGNOSIS — K921 Melena: Principal | ICD-10-CM

## 2020-08-15 DIAGNOSIS — I2510 Atherosclerotic heart disease of native coronary artery without angina pectoris: Secondary | ICD-10-CM | POA: Diagnosis present

## 2020-08-15 DIAGNOSIS — K219 Gastro-esophageal reflux disease without esophagitis: Secondary | ICD-10-CM | POA: Diagnosis present

## 2020-08-15 DIAGNOSIS — Z951 Presence of aortocoronary bypass graft: Secondary | ICD-10-CM | POA: Diagnosis not present

## 2020-08-15 DIAGNOSIS — F419 Anxiety disorder, unspecified: Secondary | ICD-10-CM | POA: Diagnosis present

## 2020-08-15 DIAGNOSIS — D638 Anemia in other chronic diseases classified elsewhere: Secondary | ICD-10-CM | POA: Diagnosis present

## 2020-08-15 DIAGNOSIS — D469 Myelodysplastic syndrome, unspecified: Secondary | ICD-10-CM | POA: Diagnosis present

## 2020-08-15 DIAGNOSIS — D62 Acute posthemorrhagic anemia: Secondary | ICD-10-CM | POA: Diagnosis present

## 2020-08-15 DIAGNOSIS — N1832 Chronic kidney disease, stage 3b: Secondary | ICD-10-CM | POA: Diagnosis present

## 2020-08-15 DIAGNOSIS — I5022 Chronic systolic (congestive) heart failure: Secondary | ICD-10-CM | POA: Diagnosis not present

## 2020-08-15 DIAGNOSIS — R230 Cyanosis: Secondary | ICD-10-CM | POA: Diagnosis present

## 2020-08-15 DIAGNOSIS — I680 Cerebral amyloid angiopathy: Secondary | ICD-10-CM | POA: Diagnosis present

## 2020-08-15 DIAGNOSIS — K31819 Angiodysplasia of stomach and duodenum without bleeding: Secondary | ICD-10-CM | POA: Diagnosis present

## 2020-08-15 DIAGNOSIS — Z87442 Personal history of urinary calculi: Secondary | ICD-10-CM | POA: Diagnosis not present

## 2020-08-15 DIAGNOSIS — I10 Essential (primary) hypertension: Secondary | ICD-10-CM | POA: Diagnosis not present

## 2020-08-15 DIAGNOSIS — N400 Enlarged prostate without lower urinary tract symptoms: Secondary | ICD-10-CM | POA: Diagnosis present

## 2020-08-15 DIAGNOSIS — I5042 Chronic combined systolic (congestive) and diastolic (congestive) heart failure: Secondary | ICD-10-CM | POA: Diagnosis present

## 2020-08-15 HISTORY — PX: ESOPHAGOGASTRODUODENOSCOPY: SHX5428

## 2020-08-15 LAB — CBC
HCT: 28.5 % — ABNORMAL LOW (ref 39.0–52.0)
Hemoglobin: 9.3 g/dL — ABNORMAL LOW (ref 13.0–17.0)
MCH: 32.5 pg (ref 26.0–34.0)
MCHC: 32.6 g/dL (ref 30.0–36.0)
MCV: 99.7 fL (ref 80.0–100.0)
Platelets: 120 10*3/uL — ABNORMAL LOW (ref 150–400)
RBC: 2.86 MIL/uL — ABNORMAL LOW (ref 4.22–5.81)
RDW: 19.2 % — ABNORMAL HIGH (ref 11.5–15.5)
WBC: 7.4 10*3/uL (ref 4.0–10.5)
nRBC: 0 % (ref 0.0–0.2)

## 2020-08-15 LAB — BPAM RBC
Blood Product Expiration Date: 202204272359
Blood Product Expiration Date: 202204282359
ISSUE DATE / TIME: 202204011910
ISSUE DATE / TIME: 202204031257
Unit Type and Rh: 6200
Unit Type and Rh: 6200

## 2020-08-15 LAB — TYPE AND SCREEN
ABO/RH(D): A POS
Antibody Screen: NEGATIVE
Unit division: 0
Unit division: 0

## 2020-08-15 LAB — BASIC METABOLIC PANEL
Anion gap: 7 (ref 5–15)
BUN: 17 mg/dL (ref 8–23)
CO2: 21 mmol/L — ABNORMAL LOW (ref 22–32)
Calcium: 8.1 mg/dL — ABNORMAL LOW (ref 8.9–10.3)
Chloride: 108 mmol/L (ref 98–111)
Creatinine, Ser: 1.61 mg/dL — ABNORMAL HIGH (ref 0.61–1.24)
GFR, Estimated: 41 mL/min — ABNORMAL LOW (ref >60)
Glucose, Bld: 80 mg/dL (ref 70–99)
Potassium: 4.7 mmol/L (ref 3.5–5.1)
Sodium: 136 mmol/L (ref 135–145)

## 2020-08-15 SURGERY — EGD (ESOPHAGOGASTRODUODENOSCOPY)
Anesthesia: General

## 2020-08-15 MED ORDER — PROPOFOL 10 MG/ML IV BOLUS
INTRAVENOUS | Status: DC | PRN
  Administered 2020-08-15 (×2): 50 mg via INTRAVENOUS

## 2020-08-15 MED ORDER — SODIUM CHLORIDE 0.9 % IV SOLN: INTRAVENOUS | Status: DC

## 2020-08-15 MED ORDER — PROPOFOL 500 MG/50ML IV EMUL
INTRAVENOUS | Status: DC | PRN
  Administered 2020-08-15: 100 ug/kg/min via INTRAVENOUS

## 2020-08-15 NOTE — Anesthesia Preprocedure Evaluation (Addendum)
Anesthesia Evaluation  Patient identified by MRN, date of birth, ID band Patient awake    Reviewed: Allergy & Precautions, NPO status , Patient's Chart, lab work & pertinent test results  History of Anesthesia Complications Negative for: history of anesthetic complications  Airway Mallampati: II       Dental   Pulmonary neg sleep apnea, neg COPD, Not current smoker, former smoker,           Cardiovascular hypertension, Pt. on medications + CAD, + Past MI, + CABG and +CHF  + Valvular Problems/Murmurs AS and MR   1. Left ventricular ejection fraction, by estimation, is 35 to 40%. Left  ventricular ejection fraction by PLAX is 40 %. The left ventricle has mild  to moderately decreased function. The left ventricle demonstrates regional  wall motion abnormalities (see  scoring diagram/findings for description). The left ventricular internal  cavity size was mildly dilated. Left ventricular diastolic parameters are  consistent with Grade I diastolic dysfunction (impaired relaxation).  2. Right ventricular systolic function is normal. The right ventricular  size is normal.  3. Left atrial size was mildly dilated.  4. Right atrial size was mildly dilated.  5. The mitral valve is grossly normal. Mild mitral valve regurgitation.  6. The aortic valve was not well visualized. Aortic valve regurgitation  is mild. Mild aortic valve stenosis.    Neuro/Psych neg Seizures Anxiety    GI/Hepatic Neg liver ROS, GERD  Medicated,  Endo/Other  neg diabetes  Renal/GU Renal InsufficiencyRenal disease     Musculoskeletal   Abdominal   Peds  Hematology  (+) anemia ,   Anesthesia Other Findings   Reproductive/Obstetrics                            Anesthesia Physical Anesthesia Plan  ASA: III  Anesthesia Plan: General   Post-op Pain Management:    Induction:   PONV Risk Score and Plan: 2 and Propofol  infusion and TIVA  Airway Management Planned: Nasal Cannula  Additional Equipment:   Intra-op Plan:   Post-operative Plan:   Informed Consent: I have reviewed the patients History and Physical, chart, labs and discussed the procedure including the risks, benefits and alternatives for the proposed anesthesia with the patient or authorized representative who has indicated his/her understanding and acceptance.       Plan Discussed with:   Anesthesia Plan Comments:         Anesthesia Quick Evaluation

## 2020-08-15 NOTE — Op Note (Signed)
[facility] Gastroenterology Patient Name: Andres Gutierrez Procedure Date: 08/15/2020 12:31 PM MRN: [id number] Account #: [id number] Date of Birth: 1931-03-17 Admit Type: Inpatient Age: 87 Room: [facility] ENDO ROOM 2 Gender: Male Note Status: Finalized Procedure:             Upper GI endoscopy Indications:           Iron deficiency anemia secondary to chronic blood                         loss, Melena Providers:             Benay Pike. Alice Reichert MD, MD Referring MD:          Rusty Aus, MD (Referring MD) Medicines:             Propofol per Anesthesia Complications:         No immediate complications. Procedure:             Pre-Anesthesia Assessment:                        - The risks and benefits of the procedure and the                         sedation options and risks were discussed with the                         patient. All questions were answered and informed                         consent was obtained.                        - Patient identification and proposed procedure were                         verified prior to the procedure by the nurse. The                         procedure was verified in the procedure room.                        - ASA Grade Assessment: III - A patient with severe                         systemic disease.                        - After reviewing the risks and benefits, the patient                         was deemed in satisfactory condition to undergo the                         procedure.                        After obtaining informed consent, the endoscope was                         passed under direct vision. Throughout  the procedure,                         the patient's blood pressure, pulse, and oxygen                         saturations were monitored continuously. The Endoscope                         was introduced through the mouth, and advanced to the                         third part of duodenum. The upper GI endoscopy  was                         accomplished without difficulty. The patient tolerated                         the procedure well. Findings:      The esophagus was normal.      Localized minimal inflammation characterized by erythema was found in       the gastric antrum.      The cardia and gastric fundus were normal on retroflexion.      A few localized erosions without bleeding were found in the duodenal       bulb.      A single small angioectasia with bleeding on contact was found in the       duodenal bulb. Coagulation for hemostasis using argon plasma at 0.8       liters/minute and 20 watts was successful.      A few diminutive angioectasias without bleeding were found in the second       portion of the duodenum. Coagulation for hemostasis using argon plasma       at 0.8 liters/minute and 20 watts was successful.      The exam was otherwise without abnormality. Impression:            - Normal esophagus.                        - Gastritis.                        - Duodenal erosions without bleeding.                        - A single angioectasia in the duodenum. Treated with                         argon plasma coagulation (APC).                        - A few non-bleeding angioectasias in the duodenum.                         Treated with argon plasma coagulation (APC).                        - The examination was otherwise normal.                        -  No specimens collected. Recommendation:        - Return patient to hospital ward for ongoing care.                        - Resume previous diet.                        - Observe patient's clinical course.                        - Serial H/H, Serial exams                        - [facility] continuing to follow case with you Procedure Code(s):     --- Professional ---                        [phone], Esophagogastroduodenoscopy, flexible,                         transoral; with control of bleeding, any method Diagnosis Code(s):     ---  Professional ---                        K92.1, Melena (includes Hematochezia)                        D50.0, Iron deficiency anemia secondary to blood loss                         (chronic)                        K31.819, Angiodysplasia of stomach and duodenum                         without bleeding                        K26.9, Duodenal ulcer, unspecified as acute or                         chronic, without hemorrhage or perforation                        K29.70, Gastritis, unspecified, without bleeding CPT copyright 2019 American Medical Association. All rights reserved. The codes documented in this report are preliminary and upon coder review may  be revised to meet current compliance requirements. Efrain Sella MD, MD 08/15/2020 12:52:18 PM This report has been signed electronically. Number of Addenda: 0 Note Initiated On: 08/15/2020 12:31 PM Estimated Blood Loss:  Estimated blood loss: none.      [facility]

## 2020-08-15 NOTE — Progress Notes (Signed)
PROGRESS NOTE    Andres Gutierrez  OXB:[id number] DOB: 1930/05/31 DOA: 08/12/2020 PCP: Rusty Aus, MD   Brief Narrative: MD SMOLA is a 87 y.o. male with a history of anemia, thrombocytopenia, MDS, atrial fibrillation, heart failure, CKD, CAD, hypertension. Patient presented secondary to dark stools and found to have evidence of melena with guaiac positive stools concerning for upper GI bleeding. GI consulted.   Assessment & Plan:   Active Problems:   CAD (coronary artery disease)   HTN (hypertension)   Chronic atrial fibrillation (HCC)   Chronic systolic heart failure (HCC)   Stage 3 chronic kidney disease (HCC)   MDS (myelodysplastic syndrome), low grade (HCC)   GI bleed   Melena GI Bleed Melena with guaiac positive stools. Patient with a history of esophagitis/gastritis. Associated symptomatic anemia. GI consulted on admission and performed EGD on 4/4 revealing gastritis, single bleeding angioectasia in duodenum treated with APC and multiple non-bleeding angioectasias not treated. -GI recommendations: EGD today; continued observation -Continue Protonix IV  Symptomatic anemia Acute blood loss anemia Chronic anemia Baseline hemoglobin of 8-9. Hemoglobin of 7.8 on admission with symptoms of fatigue; patient transfused 1 unit of PRBC in the ED and one additional unit for recurrent symptomatic anemia. Symptoms significantly improved. -Trend hemoglobin/CBC  CAD Patient is on aspirin as an outpatient which was held on admission secondary to above. No chest pain.  Chronic atrial fibrillation Patient is on metoprolol, digoxin as an outpatient. Not currently on anticoagulation. -Continue metoprolol and digoxin  GERD PPI as mentioned above.  Chronic combined systolic and diastolic heart failure Patient is on metoprolol and digoxin. He is also prescribed as needed lasix. Last EF of 35-40% from Transthoracic Echocardiogram on 10/21 with associated grade 1 diastolic  dysfunction. Currently stable. -Continue metoprolol and digoxin -Daily weights and strict in/out  BPH -Continue Flomax  CKD stage IIIb Stable.  Right foot cyanosis Decreased DP and PD pulses of right foot. ABI suggest PAD. No symptoms of claudication. Recommend outpatient management.   DVT prophylaxis: SCDs Code Status:   Code Status: Full Code Family Communication: None at bedside Disposition Plan: Discharge in 1-2 days pending GI management   Consultants:   Gastroenterology  Procedures:   UPPER GI ENDOSCOPY (08/15/2020) Impression:            - Normal esophagus.                        - Gastritis.                        - Duodenal erosions without bleeding.                        - A single angioectasia in the duodenum. Treated with                         argon plasma coagulation (APC).                        - A few non-bleeding angioectasias in the duodenum.                         Treated with argon plasma coagulation (APC).                        - The examination  was otherwise normal.                        - No specimens collected. Recommendation:        - Return patient to hospital ward for ongoing care.                        - Resume previous diet.                        - Observe patient's clinical course.                        - Serial H/H, Serial exams                        - [facility] continuing to follow case with you  Antimicrobials:  None    Subjective: Feels much better today. No bleeding overnight.  Objective: Vitals:   08/15/20 1310 08/15/20 1320 08/15/20 1339 08/15/20 1354  BP: (!) 124/54 126/60 132/61   Pulse: 65 (!) 59 62   Resp: 19 20 20    Temp:   (!) 97.5 F (36.4 C)   TempSrc:      SpO2: 99% 99% 100% 96%  Weight:      Height:        Intake/Output Summary (Last 24 hours) at 08/15/2020 1451 Last data filed at 08/15/2020 1247 Gross per 24 hour  Intake 816 ml  Output 800 ml  Net 16 ml   Filed Weights   08/12/20 1547 08/14/20 0453  08/15/20 0500  Weight: 82.6 kg 86.7 kg 84.8 kg    Examination:  General exam: Appears calm and comfortable Respiratory system: Clear to auscultation. Respiratory effort normal. Cardiovascular system: S1 & S2 heard, RRR. 2/6 systolic murmur Gastrointestinal system: Abdomen is nondistended, soft and nontender. No organomegaly or masses felt. Normal bowel sounds heard. Central nervous system: Alert and oriented. No focal neurological deficits. Musculoskeletal: No calf tenderness Skin: No cyanosis. No rashes Psychiatry: Judgement and insight appear normal. Mood & affect appropriate.    Data Reviewed: I have personally reviewed following labs and imaging studies  CBC Lab Results  Component Value Date   WBC 7.4 08/15/2020   RBC 2.86 (L) 08/15/2020   HGB 9.3 (L) 08/15/2020   HCT 28.5 (L) 08/15/2020   MCV 99.7 08/15/2020   MCH 32.5 08/15/2020   PLT 120 (L) 08/15/2020   MCHC 32.6 08/15/2020   RDW 19.2 (H) 08/15/2020   LYMPHSABS 2.7 08/05/2020   MONOABS 2.7 (H) 08/05/2020   EOSABS 0.0 08/05/2020   BASOSABS 0.0 55/73/2202     Last metabolic panel Lab Results  Component Value Date   NA 136 08/15/2020   K 4.7 08/15/2020   CL 108 08/15/2020   CO2 21 (L) 08/15/2020   BUN 17 08/15/2020   CREATININE 1.61 (H) 08/15/2020   GLUCOSE 80 08/15/2020   GFRNONAA 41 (L) 08/15/2020   GFRAA 45 (L) 01/07/2020   CALCIUM 8.1 (L) 08/15/2020   PHOS 3.3 07/31/2019   PROT 6.2 (L) 08/13/2020   ALBUMIN 3.6 08/13/2020   LABGLOB 3.1 08/02/2015   BILITOT 0.9 08/13/2020   ALKPHOS 52 08/13/2020   AST 19 08/13/2020   ALT 10 08/13/2020   ANIONGAP 7 08/15/2020    CBG (last 3)  No results for input(s): GLUCAP in the last 72 hours.   GFR: Estimated  Creatinine Clearance: 38.8 mL/min (A) (by C-G formula based on SCr of 1.61 mg/dL (H)).  Coagulation Profile: Recent Labs  Lab 08/12/20 1553  INR 1.2    Recent Results (from the past 240 hour(s))  Resp Panel by RT-PCR (Flu A&B, Covid)  Nasopharyngeal Swab     Status: None   Collection Time: 08/12/20  7:24 PM   Specimen: Nasopharyngeal Swab; Nasopharyngeal(NP) swabs in vial transport medium  Result Value Ref Range Status   SARS Coronavirus 2 by RT PCR NEGATIVE NEGATIVE Final    Comment: (NOTE) SARS-CoV-2 target nucleic acids are NOT DETECTED.  The SARS-CoV-2 RNA is generally detectable in upper respiratory specimens during the acute phase of infection. The lowest concentration of SARS-CoV-2 viral copies this assay can detect is 138 copies/mL. A negative result does not preclude SARS-Cov-2 infection and should not be used as the sole basis for treatment or other patient management decisions. A negative result may occur with  improper specimen collection/handling, submission of specimen other than nasopharyngeal swab, presence of viral mutation(s) within the areas targeted by this assay, and inadequate number of viral copies(<138 copies/mL). A negative result must be combined with clinical observations, patient history, and epidemiological information. The expected result is Negative.  Fact Sheet for Patients:  EntrepreneurPulse.com.au  Fact Sheet for Healthcare Providers:  IncredibleEmployment.be  This test is no t yet approved or cleared by the [place] FDA and  has been authorized for detection and/or diagnosis of SARS-CoV-2 by FDA under an Emergency Use Authorization (EUA). This EUA will remain  in effect (meaning this test can be used) for the duration of the COVID-19 declaration under Section 564(b)(1) of the Act, 21 U.S.C.section 360bbb-3(b)(1), unless the authorization is terminated  or revoked sooner.       Influenza A by PCR NEGATIVE NEGATIVE Final   Influenza B by PCR NEGATIVE NEGATIVE Final    Comment: (NOTE) The Xpert Xpress SARS-CoV-2/FLU/RSV plus assay is intended as an aid in the diagnosis of influenza from Nasopharyngeal swab specimens and should not be  used as a sole basis for treatment. Nasal washings and aspirates are unacceptable for Xpert Xpress SARS-CoV-2/FLU/RSV testing.  Fact Sheet for Patients: EntrepreneurPulse.com.au  Fact Sheet for Healthcare Providers: IncredibleEmployment.be  This test is not yet approved or cleared by the [place] FDA and has been authorized for detection and/or diagnosis of SARS-CoV-2 by FDA under an Emergency Use Authorization (EUA). This EUA will remain in effect (meaning this test can be used) for the duration of the COVID-19 declaration under Section 564(b)(1) of the Act, 21 U.S.C. section 360bbb-3(b)(1), unless the authorization is terminated or revoked.  Performed at [facility], [address] [place],  [address]         Radiology Studies: US ARTERIAL ABI (SCREENING LOWER EXTREMITY)  Result Date: 08/15/2020 CLINICAL DATA:  Cyanosis EXAM: NONINVASIVE PHYSIOLOGIC VASCULAR STUDY OF BILATERAL LOWER EXTREMITIES TECHNIQUE: Evaluation of both lower extremities were performed at rest, including calculation of ankle-brachial indices with single level Doppler, pressure and pulse volume recording. COMPARISON:  None. FINDINGS: Right ABI:  Unable to calculate Left ABI:  0.94 Right Lower Extremity:  Monophasic arterial waveforms at the ankle. Left Lower Extremity: Biphasic dorsalis pedis arterial waveform. Monophasic posterior tibial arterial waveform. > 1.4 Non diagnostic secondary to incompressible vessel calcifications (medial arterial sclerosis of [place]) IMPRESSION: 1. Unable to calculate the resting right ankle-brachial index secondary to poor compressibility of the arteries at the ankle. This is typically seen in the setting of medial arteriosclerosis.  2. Mildly abnormal resting left ankle-brachial index. This is likely falsely elevated due to poor compressibility of the arteries. 3. Abnormal arterial waveforms suggesting at least some degree  of upstream peripheral arterial disease. Electronically Signed   By: Jacqulynn Cadet M.D.   On: 08/15/2020 05:05        Scheduled Meds: . atorvastatin  40 mg Oral Q supper  . digoxin  0.0625 mg Oral Daily  . metoprolol tartrate  12.5 mg Oral QHS  . pantoprazole  40 mg Intravenous Q12H  . ranolazine  500 mg Oral BID  . tamsulosin  0.4 mg Oral QPC supper  . vitamin B-12  500 mcg Oral Daily   Continuous Infusions:   LOS: 0 days     Cordelia Poche, MD [facility] 08/15/2020, 2:51 PM  If 7PM-7AM, please contact night-coverage [email or web address]

## 2020-08-15 NOTE — Interval H&P Note (Signed)
History and Physical Interval Note:  08/15/2020 12:30 PM  Andres Gutierrez  has presented today for surgery, with the diagnosis of Melena/Symptomatic anemia, Hx of esophagitis, GERD.  The various methods of treatment have been discussed with the patient and family. After consideration of risks, benefits and other options for treatment, the patient has consented to  Procedure(s): ESOPHAGOGASTRODUODENOSCOPY (EGD) (N/A) as a surgical intervention.  The patient's history has been reviewed, patient examined, no change in status, stable for surgery.  I have reviewed the patient's chart and labs.  Questions were answered to the patient's satisfaction.     [place], [place]

## 2020-08-15 NOTE — Transfer of Care (Signed)
Immediate Anesthesia Transfer of Care Note  Patient: Andres Gutierrez  Procedure(s) Performed: ESOPHAGOGASTRODUODENOSCOPY (EGD) (N/A )  Patient Location: Endoscopy Unit  Anesthesia Type:General  Level of Consciousness: awake  Airway & Oxygen Therapy: Patient Spontanous Breathing and Patient connected to nasal cannula oxygen  Post-op Assessment: Report given to RN and Post -op Vital signs reviewed and stable  Post vital signs: Reviewed  Last Vitals:  Vitals Value Taken Time  BP 100/55 08/15/20 1250  Temp 36.2 C 08/15/20 1250  Pulse 65 08/15/20 1252  Resp 26 08/15/20 1252  SpO2 96 % 08/15/20 1252  Vitals shown include unvalidated device data.  Last Pain:  Vitals:   08/15/20 1250  TempSrc: Temporal  PainSc: Asleep         Complications: No complications documented.

## 2020-08-15 NOTE — Anesthesia Postprocedure Evaluation (Signed)
Anesthesia Post Note  Patient: Andres Gutierrez  Procedure(s) Performed: ESOPHAGOGASTRODUODENOSCOPY (EGD) (N/A )  Patient location during evaluation: Endoscopy Anesthesia Type: General Level of consciousness: awake and alert Pain management: pain level controlled Vital Signs Assessment: post-procedure vital signs reviewed and stable Respiratory status: spontaneous breathing and respiratory function stable Cardiovascular status: stable Anesthetic complications: no   No complications documented.   Last Vitals:  Vitals:   08/15/20 1320 08/15/20 1339  BP: 126/60 132/61  Pulse: (!) 59 62  Resp: 20 20  Temp:  (!) 36.4 C  SpO2: 99% 100%    Last Pain:  Vitals:   08/15/20 1320  TempSrc:   PainSc: 0-No pain                 Sakari Raisanen K

## 2020-08-15 NOTE — Progress Notes (Signed)
Patient returned from endoscopy.

## 2020-08-16 ENCOUNTER — Encounter: Payer: Self-pay | Admitting: Internal Medicine

## 2020-08-16 LAB — CBC
HCT: 28.1 % — ABNORMAL LOW (ref 39.0–52.0)
Hemoglobin: 9 g/dL — ABNORMAL LOW (ref 13.0–17.0)
MCH: 31.5 pg (ref 26.0–34.0)
MCHC: 32 g/dL (ref 30.0–36.0)
MCV: 98.3 fL (ref 80.0–100.0)
Platelets: 119 10*3/uL — ABNORMAL LOW (ref 150–400)
RBC: 2.86 MIL/uL — ABNORMAL LOW (ref 4.22–5.81)
RDW: 18.6 % — ABNORMAL HIGH (ref 11.5–15.5)
WBC: 8.2 10*3/uL (ref 4.0–10.5)
nRBC: 0 % (ref 0.0–0.2)

## 2020-08-16 NOTE — Progress Notes (Signed)
[facility] Inpatient Progress Note  Subjective: Patient seen for follow-up of melena, anemia secondary to acute GI blood loss.  Patient stable without symptoms of abdominal pain, nausea or vomiting.  Hemoglobin has been stable around 9 g.  Daughter, [name], is at the bedside.  Results of endoscopy were discussed with both [name] at the bedside as well as over the phone with patient's son, Asriel Westrup.  Objective: Vital signs in last 24 hours: Temp:  [98 F (36.7 C)-98.6 F (37 C)] 98 F (36.7 C) (04/05 1211) Pulse Rate:  [58-66] 63 (04/05 1211) Resp:  [16-20] 16 (04/05 1211) BP: (110-122)/(47-58) 118/54 (04/05 1211) SpO2:  [93 %-97 %] 97 % (04/05 1211) Weight:  [85.2 kg] 85.2 kg (04/05 0500) Blood pressure (!) 118/54, pulse 63, temperature 98 F (36.7 C), resp. rate 16, height 6\' 4"  (1.93 m), weight 85.2 kg, SpO2 97 %.    Intake/Output from previous day: 04/04 0701 - 04/05 0700 In: 200 [I.V.:200] Out: 300 [Urine:300]  Intake/Output this shift: Total I/O In: 600 [P.O.:600] Out: -    General appearance: Alert, appropriate, no distress Resp:  CTA Cardio:  RRR GI:  Soft, benign, no masses. BS+ Extremities: No edema   Lab Results: Results for orders placed or performed during the hospital encounter of 08/12/20 (from the past 24 hour(s))  CBC     Status: Abnormal   Collection Time: 08/16/20  5:05 AM  Result Value Ref Range   WBC 8.2 4.0 - 10.5 K/uL   RBC 2.86 (L) 4.22 - 5.81 MIL/uL   Hemoglobin 9.0 (L) 13.0 - 17.0 g/dL   HCT 28.1 (L) 39.0 - 52.0 %   MCV 98.3 80.0 - 100.0 fL   MCH 31.5 26.0 - 34.0 pg   MCHC 32.0 30.0 - 36.0 g/dL   RDW 18.6 (H) 11.5 - 15.5 %   Platelets 119 (L) 150 - 400 K/uL   nRBC 0.0 0.0 - 0.2 %     Recent Labs    08/14/20 0557 08/14/20 2110 08/15/20 0552 08/16/20 0505  WBC 6.6  --  7.4 8.2  HGB 7.9* 10.1* 9.3* 9.0*  HCT 24.8* 31.4* 28.5* 28.1*  PLT 127*  --  120* 119*   BMET Recent Labs    08/15/20 0552  NA 136  K  4.7  CL 108  CO2 21*  GLUCOSE 80  BUN 17  CREATININE 1.61*  CALCIUM 8.1*   LFT No results for input(s): PROT, ALBUMIN, AST, ALT, ALKPHOS, BILITOT, BILIDIR, IBILI in the last 72 hours. PT/INR No results for input(s): LABPROT, INR in the last 72 hours. Hepatitis Panel No results for input(s): HEPBSAG, HCVAB, HEPAIGM, HEPBIGM in the last 72 hours. C-Diff No results for input(s): CDIFFTOX in the last 72 hours. No results for input(s): CDIFFPCR in the last 72 hours.   Studies/Results: No results found.  Scheduled Inpatient Medications:   . atorvastatin  40 mg Oral Q supper  . digoxin  0.0625 mg Oral Daily  . metoprolol tartrate  12.5 mg Oral QHS  . pantoprazole  40 mg Intravenous Q12H  . ranolazine  500 mg Oral BID  . tamsulosin  0.4 mg Oral QPC supper  . vitamin B-12  500 mcg Oral Daily    Continuous Inpatient Infusions:    PRN Inpatient Medications:  acetaminophen **OR** acetaminophen, oxyCODONE, polyethylene glycol, promethazine, traZODone    Assessment:  1. Small bowel angioectasias - Likely source of bleeding, s/p EGD with APC cautery. Stable. 2. Anemia secondary to GI blood  loss - Stable. 3. GERD. 4 .CHF 5. Chronic afib.  Plan:  1. Continue current medications. 2. Discharge in AM (08/17/2020) okay from GI standpoint given continued improvement. 3. GI sign off.  Call back if needed.  Shon Indelicato K. Alice Reichert, M.D. 08/16/2020, 6:33 PM

## 2020-08-16 NOTE — Progress Notes (Signed)
PROGRESS NOTE    Andres Gutierrez  VQQ:[id number] DOB: 25-Apr-1931 DOA: 08/12/2020 PCP: Rusty Aus, MD   Brief Narrative: Andres Gutierrez is a 87 y.o. male with a history of anemia, thrombocytopenia, MDS, atrial fibrillation, heart failure, CKD, CAD, hypertension. Patient presented secondary to dark stools and found to have evidence of melena with guaiac positive stools concerning for upper GI bleeding. GI consulted.   Assessment & Plan:   Active Problems:   CAD (coronary artery disease)   HTN (hypertension)   Chronic atrial fibrillation (HCC)   Chronic systolic heart failure (HCC)   Stage 3 chronic kidney disease (HCC)   MDS (myelodysplastic syndrome), low grade (HCC)   GI bleed   Melena GI Bleed Melena with guaiac positive stools. Patient with a history of esophagitis/gastritis. Associated symptomatic anemia. GI consulted on admission and performed EGD on 4/4 revealing gastritis, single bleeding angioectasia in duodenum treated with APC and multiple non-bleeding angioectasias not treated. -GI recommendations: pending -Continue Protonix IV  Symptomatic anemia Acute blood loss anemia Chronic anemia Baseline hemoglobin of 8-9. Hemoglobin of 7.8 on admission with symptoms of fatigue; patient transfused 1 unit of PRBC in the ED and one additional unit for recurrent symptomatic anemia. Symptoms significantly improved. -Trend hemoglobin/CBC  CAD Patient is on aspirin as an outpatient which was held on admission secondary to above. No chest pain.  Chronic atrial fibrillation Patient is on metoprolol, digoxin as an outpatient. Not currently on anticoagulation. -Continue metoprolol and digoxin  GERD PPI as mentioned above.  Chronic combined systolic and diastolic heart failure Patient is on metoprolol and digoxin. He is also prescribed as needed lasix. Last EF of 35-40% from Transthoracic Echocardiogram on 10/21 with associated grade 1 diastolic dysfunction. Currently  stable. -Continue metoprolol and digoxin -Daily weights and strict in/out  BPH -Continue Flomax  CKD stage IIIb Stable.  Right foot cyanosis Decreased DP and PD pulses of right foot. ABI suggest PAD. No symptoms of claudication. Recommend outpatient management.   DVT prophylaxis: SCDs Code Status:   Code Status: Full Code Family Communication: Son on telephone Disposition Plan: Discharge pending GI recommendation. Otherwise medically stable.   Consultants:   Gastroenterology  Procedures:   UPPER GI ENDOSCOPY (08/15/2020) Impression:            - Normal esophagus.                        - Gastritis.                        - Duodenal erosions without bleeding.                        - A single angioectasia in the duodenum. Treated with                         argon plasma coagulation (APC).                        - A few non-bleeding angioectasias in the duodenum.                         Treated with argon plasma coagulation (APC).                        - The examination was otherwise normal.                        -  No specimens collected. Recommendation:        - Return patient to hospital ward for ongoing care.                        - Resume previous diet.                        - Observe patient's clinical course.                        - Serial H/H, Serial exams                        - [facility] continuing to follow case with you  Antimicrobials:  None    Subjective: No concerns. Eager for discharge.  Objective: Vitals:   08/16/20 0500 08/16/20 0507 08/16/20 0812 08/16/20 1211  BP:  (!) 110/58 (!) 122/55 (!) 118/54  Pulse:  66 (!) 58 63  Resp:  20 18 16   Temp:  98.2 F (36.8 C) 98.6 F (37 C) 98 F (36.7 C)  TempSrc:  Oral Oral   SpO2:  93% 96% 97%  Weight: 85.2 kg     Height:        Intake/Output Summary (Last 24 hours) at 08/16/2020 1845 Last data filed at 08/16/2020 1300 Gross per 24 hour  Intake 600 ml  Output 300 ml  Net 300 ml   Filed Weights    08/14/20 0453 08/15/20 0500 08/16/20 0500  Weight: 86.7 kg 84.8 kg 85.2 kg    Examination:  General exam: Appears calm and comfortable Respiratory system: Clear to auscultation. Respiratory effort normal. Cardiovascular system: S1 & S2 heard, RRR. Systolic murmur Gastrointestinal system: Abdomen is nondistended, soft and nontender. No organomegaly or masses felt. Normal bowel sounds heard. Central nervous system: Alert and oriented. No focal neurological deficits. Musculoskeletal: No calf tenderness Skin: No cyanosis. No rashes Psychiatry: Judgement and insight appear normal. Mood & affect appropriate.    Data Reviewed: I have personally reviewed following labs and imaging studies  CBC Lab Results  Component Value Date   WBC 8.2 08/16/2020   RBC 2.86 (L) 08/16/2020   HGB 9.0 (L) 08/16/2020   HCT 28.1 (L) 08/16/2020   MCV 98.3 08/16/2020   MCH 31.5 08/16/2020   PLT 119 (L) 08/16/2020   MCHC 32.0 08/16/2020   RDW 18.6 (H) 08/16/2020   LYMPHSABS 2.7 08/05/2020   MONOABS 2.7 (H) 08/05/2020   EOSABS 0.0 08/05/2020   BASOSABS 0.0 81/05/7508     Last metabolic panel Lab Results  Component Value Date   NA 136 08/15/2020   K 4.7 08/15/2020   CL 108 08/15/2020   CO2 21 (L) 08/15/2020   BUN 17 08/15/2020   CREATININE 1.61 (H) 08/15/2020   GLUCOSE 80 08/15/2020   GFRNONAA 41 (L) 08/15/2020   GFRAA 45 (L) 01/07/2020   CALCIUM 8.1 (L) 08/15/2020   PHOS 3.3 07/31/2019   PROT 6.2 (L) 08/13/2020   ALBUMIN 3.6 08/13/2020   LABGLOB 3.1 08/02/2015   BILITOT 0.9 08/13/2020   ALKPHOS 52 08/13/2020   AST 19 08/13/2020   ALT 10 08/13/2020   ANIONGAP 7 08/15/2020    CBG (last 3)  No results for input(s): GLUCAP in the last 72 hours.   GFR: Estimated Creatinine Clearance: 39 mL/min (A) (by C-G formula based on SCr of 1.61 mg/dL (H)).  Coagulation Profile: Recent Labs  Lab  08/12/20 1553  INR 1.2    Recent Results (from the past 240 hour(s))  Resp Panel by RT-PCR  (Flu A&B, Covid) Nasopharyngeal Swab     Status: None   Collection Time: 08/12/20  7:24 PM   Specimen: Nasopharyngeal Swab; Nasopharyngeal(NP) swabs in vial transport medium  Result Value Ref Range Status   SARS Coronavirus 2 by RT PCR NEGATIVE NEGATIVE Final    Comment: (NOTE) SARS-CoV-2 target nucleic acids are NOT DETECTED.  The SARS-CoV-2 RNA is generally detectable in upper respiratory specimens during the acute phase of infection. The lowest concentration of SARS-CoV-2 viral copies this assay can detect is 138 copies/mL. A negative result does not preclude SARS-Cov-2 infection and should not be used as the sole basis for treatment or other patient management decisions. A negative result may occur with  improper specimen collection/handling, submission of specimen other than nasopharyngeal swab, presence of viral mutation(s) within the areas targeted by this assay, and inadequate number of viral copies(<138 copies/mL). A negative result must be combined with clinical observations, patient history, and epidemiological information. The expected result is Negative.  Fact Sheet for Patients:  EntrepreneurPulse.com.au  Fact Sheet for Healthcare Providers:  IncredibleEmployment.be  This test is no t yet approved or cleared by the [place] FDA and  has been authorized for detection and/or diagnosis of SARS-CoV-2 by FDA under an Emergency Use Authorization (EUA). This EUA will remain  in effect (meaning this test can be used) for the duration of the COVID-19 declaration under Section 564(b)(1) of the Act, 21 U.S.C.section 360bbb-3(b)(1), unless the authorization is terminated  or revoked sooner.       Influenza A by PCR NEGATIVE NEGATIVE Final   Influenza B by PCR NEGATIVE NEGATIVE Final    Comment: (NOTE) The Xpert Xpress SARS-CoV-2/FLU/RSV plus assay is intended as an aid in the diagnosis of influenza from Nasopharyngeal swab specimens  and should not be used as a sole basis for treatment. Nasal washings and aspirates are unacceptable for Xpert Xpress SARS-CoV-2/FLU/RSV testing.  Fact Sheet for Patients: EntrepreneurPulse.com.au  Fact Sheet for Healthcare Providers: IncredibleEmployment.be  This test is not yet approved or cleared by the [place] FDA and has been authorized for detection and/or diagnosis of SARS-CoV-2 by FDA under an Emergency Use Authorization (EUA). This EUA will remain in effect (meaning this test can be used) for the duration of the COVID-19 declaration under Section 564(b)(1) of the Act, 21 U.S.C. section 360bbb-3(b)(1), unless the authorization is terminated or revoked.  Performed at [facility], [address] [place], [place] [address]         Radiology Studies: No results found.      Scheduled Meds: . atorvastatin  40 mg Oral Q supper  . digoxin  0.0625 mg Oral Daily  . metoprolol tartrate  12.5 mg Oral QHS  . pantoprazole  40 mg Intravenous Q12H  . ranolazine  500 mg Oral BID  . tamsulosin  0.4 mg Oral QPC supper  . vitamin B-12  500 mcg Oral Daily   Continuous Infusions:   LOS: 1 day     Cordelia Poche, MD [facility] 08/16/2020, 6:45 PM  If 7PM-7AM, please contact night-coverage [email or web address]

## 2020-08-16 NOTE — Discharge Instructions (Signed)
Andres Gutierrez,  You were in the hospital with melena (black stools concerning for bleeding). You had an upper endoscopy which found an area that might have been responsible. That was treated by the GI physician.

## 2020-08-17 DIAGNOSIS — D649 Anemia, unspecified: Secondary | ICD-10-CM

## 2020-08-17 LAB — CBC
HCT: 28.5 % — ABNORMAL LOW (ref 39.0–52.0)
Hemoglobin: 9.4 g/dL — ABNORMAL LOW (ref 13.0–17.0)
MCH: 32.2 pg (ref 26.0–34.0)
MCHC: 33 g/dL (ref 30.0–36.0)
MCV: 97.6 fL (ref 80.0–100.0)
Platelets: 140 10*3/uL — ABNORMAL LOW (ref 150–400)
RBC: 2.92 MIL/uL — ABNORMAL LOW (ref 4.22–5.81)
RDW: 18.4 % — ABNORMAL HIGH (ref 11.5–15.5)
WBC: 7.9 10*3/uL (ref 4.0–10.5)
nRBC: 0 % (ref 0.0–0.2)

## 2020-08-17 MED ORDER — PANTOPRAZOLE SODIUM 40 MG PO TBEC: 40.0000 mg | DELAYED_RELEASE_TABLET | Freq: Two times a day (BID) | ORAL | 1 refills | Status: AC

## 2020-08-17 MED ORDER — POLYETHYLENE GLYCOL 3350 17 G PO PACK: 17.0000 g | PACK | Freq: Every day | ORAL | 0 refills | Status: AC | PRN

## 2020-08-17 NOTE — Clinical Social Work Note (Addendum)
Patient has orders to discharge home today. MD entered orders for home health. Patient is not interested at this time. He will call his PCP if he changes his mind after returning home. Reports being fairly independent at home. He uses a walker at home. No further concerns. CSW signing off.  Andres Gutierrez, [place]

## 2020-08-17 NOTE — Progress Notes (Signed)
Pt discharged home with his daughter. VSS stable pt without complaiints. Discharge instructions provided and questions answered.  Pt discharged via wheelchair.

## 2020-08-17 NOTE — Discharge Summary (Signed)
Physician Discharge Summary  Andres Gutierrez UJW:[id number] DOB: [birth year] DOA: 08/12/2020  PCP: Rusty Aus, MD  Admit date: 08/12/2020 Discharge date: 08/17/2020  Admitted From: Home Disposition: Home  Recommendations for Outpatient Follow-up:  1. Follow up with PCP in 1-2 weeks 2. Please obtain BMP/CBC in one week 3. Please follow up on the following pending results: None  Home Health: Yes Equipment/Devices: None Discharge Condition: Stable CODE STATUS: Full Diet recommendation: Heart Healthy   Brief/Interim Summary: Andres Gutierrez is a 87 y.o. male with a history of anemia, thrombocytopenia, MDS, atrial fibrillation, heart failure, CKD, CAD, hypertension. Patient presented secondary to dark stools and found to have evidence of melena with guaiac positive stools concerning for upper GI bleeding. GI consulted.  Patient underwent EGD on 08/15/2020 which shows gastritis, single bleeding angiectasia in first part of duodenum with multiple small angiectasia's in the second part of duodenum which were treated with APC.  Patient hemoglobin nadired at 7.8 and he received 2 units of PRBC with improvement of his symptoms.  Hemoglobin stable at 9.4 on discharge.  He will follow-up with gastroenterology as an outpatient.  He received IV Protonix while in the hospital and discharged on p.o. We discontinued home dose of aspirin, he was advised to follow-up with his cardiologist and they can restart if needed.  Patient has an history of combined systolic and diastolic heart failure.  EF of 35 to 40% according to echocardiogram done in October 2021.  Grade 1 diastolic dysfunction.  Remained stable during current hospitalization and he will continue his home dose of metoprolol and digoxin.   Patient has an history of CKD stage IIIb which remained stable.  Patient was also having right foot cyanosis, decreased DP and PT pulses of right foot.  ABIs suggest peripheral arterial disease.  No symptoms of  claudication.  Outpatient follow-up with vascular recommended.  Patient will continue rest of his home medications and follow-up with his providers.  Discharge Diagnoses:  Active Problems:   CAD (coronary artery disease)   HTN (hypertension)   Chronic atrial fibrillation (HCC)   Chronic systolic heart failure (HCC)   Symptomatic anemia   Stage 3 chronic kidney disease (HCC)   MDS (myelodysplastic syndrome), low grade (HCC)   GI bleed   Discharge Instructions  Discharge Instructions    Diet - low sodium heart healthy   Complete by: As directed    Discharge instructions   Complete by: As directed    It was pleasure taking care of you. I am holding your home dose of baby aspirin due to your recent bleed, your cardiologist can restart if needed. Continue taking your iron supplement to improve your hemoglobin level. Follow-up with your primary care doctor and gastroenterologist as an outpatient.   Increase activity slowly   Complete by: As directed    No dressing needed   Complete by: As directed      Allergies as of 08/17/2020      Reactions   Levaquin [levofloxacin]    Andres Gutierrez, said Levaquin should be avoided because when patient had Levaquin a few years ago, he had "a bad reaction".  She said he could not walk and could not lift up his feet.   Escitalopram Other (See Comments)   Reaction:  Makes him feel faint, like he was going to have a heart attack.   5ht3 Receptor Antagonists    Diltiazem Rash   Diphenhydramine Hcl Other (See Comments)   Reaction:  Rash and fever  a long time ago, but has had it since with no problem.   Maxidex [dexamethasone] Other (See Comments)   Reaction:  Unknown    Prednisone Other (See Comments)   Pt states that this medication made him feel crazy.     Serotonin Other (See Comments)   Tried 2 different types, lexapro and another one.  Reaction:  Made him feel like he was having a heart attack.    Vytorin [ezetimibe-simvastatin]  Other (See Comments)   Reaction:  Unknown    Zocor [simvastatin] Other (See Comments)   Reaction:  Unknown       Medication List    STOP taking these medications   aspirin 81 MG EC tablet     TAKE these medications   atorvastatin 40 MG tablet Commonly known as: LIPITOR Take 1 tablet (40 mg total) by mouth daily with supper.   digoxin 0.125 MG tablet Commonly known as: LANOXIN Take 0.0625 mg by mouth daily.   famotidine 20 MG tablet Commonly known as: PEPCID Take 20 mg by mouth at bedtime.   feeding supplement Liqd Take 237 mLs by mouth 2 (two) times daily between meals.   ferrous sulfate 325 (65 FE) MG tablet Take 1 tablet (325 mg total) by mouth every Monday, Wednesday, and Friday. With supper   furosemide 20 MG tablet Commonly known as: LASIX Take 1 tablet (20 mg total) by mouth daily as needed (for weight gain of 3lbs in 1 day or 5 lbs in 2 days or shortness of breath or leg swelling).   metoprolol tartrate 25 MG tablet Commonly known as: LOPRESSOR Take 12.5 mg by mouth at bedtime.   multivitamin tablet Take 1 tablet by mouth daily.   pantoprazole 40 MG tablet Commonly known as: PROTONIX Take 1 tablet (40 mg total) by mouth 2 (two) times daily. What changed:   medication strength  how much to take   polyethylene glycol 17 g packet Commonly known as: MIRALAX / GLYCOLAX Take 17 g by mouth daily as needed for mild constipation.   ranolazine 1000 MG SR tablet Commonly known as: RANEXA Take 500 mg by mouth 2 (two) times daily.   sucralfate 1 g tablet Commonly known as: CARAFATE Take 1 g by mouth 2 (two) times daily.   tamsulosin 0.4 MG Caps capsule Commonly known as: FLOMAX Take 0.4 mg by mouth daily after supper.   vitamin B-12 500 MCG tablet Commonly known as: CYANOCOBALAMIN Take 500 mcg by mouth daily.            Discharge Care Instructions  (From admission, onward)         Start     Ordered   08/17/20 0000  No dressing needed         08/17/20 1058          Follow-up Information    Rusty Aus, MD. Go on 08/22/2020.   Specialty: Internal Medicine Why: at 11:30am for hospital follow-up Contact information: [address] [place] [address] [phone]        Efrain Sella, MD. Schedule an appointment as soon as possible for a visit.   Specialty: Gastroenterology Why: Office to call you with the follow-up appointment Contact information: [place] [address] [phone]              Allergies  Allergen Reactions  . Levaquin [Levofloxacin]     Andres Gutierrez, said Levaquin should be avoided because when  patient had Levaquin a few years ago, he had "a bad reaction".  She said he could not walk and could not lift up his feet.  . Escitalopram Other (See Comments)    Reaction:  Makes him feel faint, like he was going to have a heart attack.  . 5ht3 Receptor Antagonists   . Diltiazem Rash  . Diphenhydramine Hcl Other (See Comments)    Reaction:  Rash and fever a long time ago, but has had it since with no problem.  . Maxidex [Dexamethasone] Other (See Comments)    Reaction:  Unknown   . Prednisone Other (See Comments)    Pt states that this medication made him feel crazy.    . Serotonin Other (See Comments)    Tried 2 different types, lexapro and another one.  Reaction:  Made him feel like he was having a heart attack.   . Vytorin [Ezetimibe-Simvastatin] Other (See Comments)    Reaction:  Unknown   . Zocor [Simvastatin] Other (See Comments)    Reaction:  Unknown     Consultations:  Gastroenterology  Procedures/Studies: DG Chest 2 View  Result Date: 08/12/2020 CLINICAL DATA:  87 year old male with shortness of breath. EXAM: CHEST - 2 VIEW COMPARISON:  07/18/2020 FINDINGS: The mediastinal contours are within normal limits. No cardiomegaly. Postsurgical changes after coronary artery bypass graft. Flattening of the  hemidiaphragms bilaterally, similar to comparison. Mild blunting of the costophrenic angles bilaterally. Unchanged cavitary lesion in the left apex. No focal consolidation, pleural effusion, or pneumothorax. No acute osseous abnormality. IMPRESSION: No acute cardiopulmonary process. Similar appearing emphysematous changes. Electronically Signed   By: Ruthann Cancer MD   On: 08/12/2020 16:31   DG Chest 2 View  Result Date: 07/18/2020 CLINICAL DATA:  Left arm and left hand numbness. EXAM: CHEST - 2 VIEW COMPARISON:  March 03, 2020 FINDINGS: Multiple sternal wires and vascular clips are seen. Mild, stable diffusely increased interstitial lung markings are noted. Mild atelectasis and/or early infiltrate is seen within the bilateral lung bases. Very small bilateral pleural effusions are present. No pneumothorax is identified. The cardiac silhouette is mildly enlarged. Degenerative changes seen throughout the thoracic spine. IMPRESSION: 1.   Evidence of prior median sternotomy/CABG. 2.   Mild bibasilar atelectasis and/or early infiltrate. 3. Small bilateral pleural effusions. Electronically Signed   By: Virgina Norfolk M.D.   On: 07/18/2020 16:00   CT Head Wo Contrast  Result Date: 07/18/2020 CLINICAL DATA:  Headache. EXAM: CT HEAD WITHOUT CONTRAST TECHNIQUE: Contiguous axial images were obtained from the base of the skull through the vertex without intravenous contrast. COMPARISON:  03/03/2020. FINDINGS: Brain: There is no evidence for acute hemorrhage, hydrocephalus, mass lesion, or abnormal extra-axial fluid collection. No definite CT evidence for acute infarction. Diffuse loss of parenchymal volume is consistent with atrophy. There may be some encephalomalacia in the tip of the left upper lobe, stable in the interval. Patchy low attenuation in the deep hemispheric and periventricular white matter is nonspecific, but likely reflects chronic microvascular ischemic demyelination. Vascular: No hyperdense vessel  or unexpected calcification. Skull: No evidence for fracture. No worrisome lytic or sclerotic lesion. Sinuses/Orbits: The visualized paranasal sinuses and mastoid air cells are clear. Visualized portions of the globes and intraorbital fat are unremarkable. Other: None. IMPRESSION: 1. No acute intracranial abnormality. 2. Atrophy with chronic small vessel white matter ischemic disease. Electronically Signed   By: Misty Stanley M.D.   On: 07/18/2020 19:00   US ARTERIAL ABI (SCREENING LOWER EXTREMITY)  Result Date:  08/15/2020 CLINICAL DATA:  Cyanosis EXAM: NONINVASIVE PHYSIOLOGIC VASCULAR STUDY OF BILATERAL LOWER EXTREMITIES TECHNIQUE: Evaluation of both lower extremities were performed at rest, including calculation of ankle-brachial indices with single level Doppler, pressure and pulse volume recording. COMPARISON:  None. FINDINGS: Right ABI:  Unable to calculate Left ABI:  0.94 Right Lower Extremity:  Monophasic arterial waveforms at the ankle. Left Lower Extremity: Biphasic dorsalis pedis arterial waveform. Monophasic posterior tibial arterial waveform. > 1.4 Non diagnostic secondary to incompressible vessel calcifications (medial arterial sclerosis of [place]) IMPRESSION: 1. Unable to calculate the resting right ankle-brachial index secondary to poor compressibility of the arteries at the ankle. This is typically seen in the setting of medial arteriosclerosis. 2. Mildly abnormal resting left ankle-brachial index. This is likely falsely elevated due to poor compressibility of the arteries. 3. Abnormal arterial waveforms suggesting at least some degree of upstream peripheral arterial disease. Electronically Signed   By: Jacqulynn Cadet M.D.   On: 08/15/2020 05:05    Subjective: Patient was seen and examined today.  No new complaint.  He wants to go home.  Discharge Exam: Vitals:   08/17/20 0752 08/17/20 1137  BP: (!) 125/55 (!) 122/48  Pulse: 63 67  Resp: 17 18  Temp: 97.7 F (36.5 C) 97.7 F  (36.5 C)  SpO2: 95% 97%   Vitals:   08/17/20 0416 08/17/20 0421 08/17/20 0752 08/17/20 1137  BP:  (!) 124/57 (!) 125/55 (!) 122/48  Pulse:  68 63 67  Resp:  12 17 18   Temp:  97.7 F (36.5 C) 97.7 F (36.5 C) 97.7 F (36.5 C)  TempSrc:  Oral Oral   SpO2:  95% 95% 97%  Weight: 83.3 kg     Height:        General: Pt is alert, awake, not in acute distress Cardiovascular: RRR, S1/S2 +, no rubs, no gallops Respiratory: CTA bilaterally, no wheezing, no rhonchi Abdominal: Soft, NT, ND, bowel sounds + Extremities: no edema, no cyanosis   The results of significant diagnostics from this hospitalization (including imaging, microbiology, ancillary and laboratory) are listed below for reference.    Microbiology: Recent Results (from the past 240 hour(s))  Resp Panel by RT-PCR (Flu A&B, Covid) Nasopharyngeal Swab     Status: None   Collection Time: 08/12/20  7:24 PM   Specimen: Nasopharyngeal Swab; Nasopharyngeal(NP) swabs in vial transport medium  Result Value Ref Range Status   SARS Coronavirus 2 by RT PCR NEGATIVE NEGATIVE Final    Comment: (NOTE) SARS-CoV-2 target nucleic acids are NOT DETECTED.  The SARS-CoV-2 RNA is generally detectable in upper respiratory specimens during the acute phase of infection. The lowest concentration of SARS-CoV-2 viral copies this assay can detect is 138 copies/mL. A negative result does not preclude SARS-Cov-2 infection and should not be used as the sole basis for treatment or other patient management decisions. A negative result may occur with  improper specimen collection/handling, submission of specimen other than nasopharyngeal swab, presence of viral mutation(s) within the areas targeted by this assay, and inadequate number of viral copies(<138 copies/mL). A negative result must be combined with clinical observations, patient history, and epidemiological information. The expected result is Negative.  Fact Sheet for Patients:   EntrepreneurPulse.com.au  Fact Sheet for Healthcare Providers:  IncredibleEmployment.be  This test is no t yet approved or cleared by the [place] FDA and  has been authorized for detection and/or diagnosis of SARS-CoV-2 by FDA under an Emergency Use Authorization (EUA). This EUA will remain  in effect (  meaning this test can be used) for the duration of the COVID-19 declaration under Section 564(b)(1) of the Act, 21 U.S.C.section 360bbb-3(b)(1), unless the authorization is terminated  or revoked sooner.       Influenza A by PCR NEGATIVE NEGATIVE Final   Influenza B by PCR NEGATIVE NEGATIVE Final    Comment: (NOTE) The Xpert Xpress SARS-CoV-2/FLU/RSV plus assay is intended as an aid in the diagnosis of influenza from Nasopharyngeal swab specimens and should not be used as a sole basis for treatment. Nasal washings and aspirates are unacceptable for Xpert Xpress SARS-CoV-2/FLU/RSV testing.  Fact Sheet for Patients: EntrepreneurPulse.com.au  Fact Sheet for Healthcare Providers: IncredibleEmployment.be  This test is not yet approved or cleared by the [place] FDA and has been authorized for detection and/or diagnosis of SARS-CoV-2 by FDA under an Emergency Use Authorization (EUA). This EUA will remain in effect (meaning this test can be used) for the duration of the COVID-19 declaration under Section 564(b)(1) of the Act, 21 U.S.C. section 360bbb-3(b)(1), unless the authorization is terminated or revoked.  Performed at [facility], [address] [place], [place] [address]      Labs: BNP (last 3 results) Recent Labs    03/03/20 1121  BNP 009.3*   Basic Metabolic Panel: Recent Labs  Lab 08/12/20 1553 08/13/20 0532 08/15/20 0552  NA 134* 133* 136  K 4.1 4.2 4.7  CL 107 107 108  CO2 18* 19* 21*  GLUCOSE 106* 86 80  BUN 24* 22 17  CREATININE 1.70* 1.53* 1.61*  CALCIUM  8.6* 8.3* 8.1*   Liver Function Tests: Recent Labs  Lab 08/12/20 1553 08/13/20 0532  AST 22 19  ALT 14 10  ALKPHOS 64 52  BILITOT 0.7 0.9  PROT 7.0 6.2*  ALBUMIN 4.1 3.6   No results for input(s): LIPASE, AMYLASE in the last 168 hours. No results for input(s): AMMONIA in the last 168 hours. CBC: Recent Labs  Lab 08/13/20 0532 08/14/20 0557 08/14/20 2110 08/15/20 0552 08/16/20 0505 08/17/20 0551  WBC 6.7 6.6  --  7.4 8.2 7.9  HGB 8.1* 7.9* 10.1* 9.3* 9.0* 9.4*  HCT 24.9* 24.8* 31.4* 28.5* 28.1* 28.5*  MCV 100.0 100.8*  --  99.7 98.3 97.6  PLT 109* 127*  --  120* 119* 140*   Cardiac Enzymes: No results for input(s): CKTOTAL, CKMB, CKMBINDEX, TROPONINI in the last 168 hours. BNP: Invalid input(s): POCBNP CBG: No results for input(s): GLUCAP in the last 168 hours. D-Dimer No results for input(s): DDIMER in the last 72 hours. Hgb A1c No results for input(s): HGBA1C in the last 72 hours. Lipid Profile No results for input(s): CHOL, HDL, LDLCALC, TRIG, CHOLHDL, LDLDIRECT in the last 72 hours. Thyroid function studies No results for input(s): TSH, T4TOTAL, T3FREE, THYROIDAB in the last 72 hours.  Invalid input(s): FREET3 Anemia work up No results for input(s): VITAMINB12, FOLATE, FERRITIN, TIBC, IRON, RETICCTPCT in the last 72 hours. Urinalysis    Component Value Date/Time   COLORURINE YELLOW (A) 02/26/2020 1250   APPEARANCEUR CLEAR (A) 02/26/2020 1250   LABSPEC 1.011 02/26/2020 1250   PHURINE 5.0 02/26/2020 1250   GLUCOSEU NEGATIVE 02/26/2020 1250   HGBUR NEGATIVE 02/26/2020 1250   BILIRUBINUR NEGATIVE 02/26/2020 1250   KETONESUR NEGATIVE 02/26/2020 1250   PROTEINUR NEGATIVE 02/26/2020 1250   UROBILINOGEN 0.2 09/11/2010 0647   NITRITE NEGATIVE 02/26/2020 1250   LEUKOCYTESUR NEGATIVE 02/26/2020 1250   Sepsis Labs Invalid input(s): PROCALCITONIN,  WBC,  LACTICIDVEN Microbiology Recent Results (from the  past 240 hour(s))  Resp Panel by RT-PCR (Flu A&B,  Covid) Nasopharyngeal Swab     Status: None   Collection Time: 08/12/20  7:24 PM   Specimen: Nasopharyngeal Swab; Nasopharyngeal(NP) swabs in vial transport medium  Result Value Ref Range Status   SARS Coronavirus 2 by RT PCR NEGATIVE NEGATIVE Final    Comment: (NOTE) SARS-CoV-2 target nucleic acids are NOT DETECTED.  The SARS-CoV-2 RNA is generally detectable in upper respiratory specimens during the acute phase of infection. The lowest concentration of SARS-CoV-2 viral copies this assay can detect is 138 copies/mL. A negative result does not preclude SARS-Cov-2 infection and should not be used as the sole basis for treatment or other patient management decisions. A negative result may occur with  improper specimen collection/handling, submission of specimen other than nasopharyngeal swab, presence of viral mutation(s) within the areas targeted by this assay, and inadequate number of viral copies(<138 copies/mL). A negative result must be combined with clinical observations, patient history, and epidemiological information. The expected result is Negative.  Fact Sheet for Patients:  EntrepreneurPulse.com.au  Fact Sheet for Healthcare Providers:  IncredibleEmployment.be  This test is no t yet approved or cleared by the [place] FDA and  has been authorized for detection and/or diagnosis of SARS-CoV-2 by FDA under an Emergency Use Authorization (EUA). This EUA will remain  in effect (meaning this test can be used) for the duration of the COVID-19 declaration under Section 564(b)(1) of the Act, 21 U.S.C.section 360bbb-3(b)(1), unless the authorization is terminated  or revoked sooner.       Influenza A by PCR NEGATIVE NEGATIVE Final   Influenza B by PCR NEGATIVE NEGATIVE Final    Comment: (NOTE) The Xpert Xpress SARS-CoV-2/FLU/RSV plus assay is intended as an aid in the diagnosis of influenza from Nasopharyngeal swab specimens and should  not be used as a sole basis for treatment. Nasal washings and aspirates are unacceptable for Xpert Xpress SARS-CoV-2/FLU/RSV testing.  Fact Sheet for Patients: EntrepreneurPulse.com.au  Fact Sheet for Healthcare Providers: IncredibleEmployment.be  This test is not yet approved or cleared by the [place] FDA and has been authorized for detection and/or diagnosis of SARS-CoV-2 by FDA under an Emergency Use Authorization (EUA). This EUA will remain in effect (meaning this test can be used) for the duration of the COVID-19 declaration under Section 564(b)(1) of the Act, 21 U.S.C. section 360bbb-3(b)(1), unless the authorization is terminated or revoked.  Performed at [facility], [address] [place], [place] [address]     Time coordinating discharge: Over 30 minutes  SIGNED:  Lorella Nimrod, MD  [facility] 08/17/2020, 3:13 PM  If 7PM-7AM, please contact night-coverage [email or web address]  This record has been created using Systems analyst. Errors have been sought and corrected,but may not always be located. Such creation errors do not reflect on the standard of care.

## 2020-08-19 ENCOUNTER — Ambulatory Visit: Payer: PPO | Admitting: Internal Medicine

## 2020-08-19 ENCOUNTER — Ambulatory Visit: Payer: PPO

## 2020-08-19 ENCOUNTER — Other Ambulatory Visit: Payer: PPO

## 2020-08-22 ENCOUNTER — Inpatient Hospital Stay: Payer: PPO | Attending: Internal Medicine

## 2020-08-22 ENCOUNTER — Encounter: Payer: Self-pay | Admitting: Internal Medicine

## 2020-08-22 ENCOUNTER — Other Ambulatory Visit: Payer: Self-pay

## 2020-08-22 ENCOUNTER — Inpatient Hospital Stay (HOSPITAL_BASED_OUTPATIENT_CLINIC_OR_DEPARTMENT_OTHER): Payer: PPO | Admitting: Internal Medicine

## 2020-08-22 ENCOUNTER — Inpatient Hospital Stay: Payer: PPO

## 2020-08-22 DIAGNOSIS — Z79899 Other long term (current) drug therapy: Secondary | ICD-10-CM | POA: Diagnosis not present

## 2020-08-22 DIAGNOSIS — D472 Monoclonal gammopathy: Secondary | ICD-10-CM | POA: Insufficient documentation

## 2020-08-22 DIAGNOSIS — K922 Gastrointestinal hemorrhage, unspecified: Secondary | ICD-10-CM | POA: Insufficient documentation

## 2020-08-22 DIAGNOSIS — I509 Heart failure, unspecified: Secondary | ICD-10-CM | POA: Insufficient documentation

## 2020-08-22 DIAGNOSIS — I252 Old myocardial infarction: Secondary | ICD-10-CM | POA: Diagnosis not present

## 2020-08-22 DIAGNOSIS — I129 Hypertensive chronic kidney disease with stage 1 through stage 4 chronic kidney disease, or unspecified chronic kidney disease: Secondary | ICD-10-CM | POA: Diagnosis not present

## 2020-08-22 DIAGNOSIS — Z87442 Personal history of urinary calculi: Secondary | ICD-10-CM | POA: Insufficient documentation

## 2020-08-22 DIAGNOSIS — Z87891 Personal history of nicotine dependence: Secondary | ICD-10-CM | POA: Insufficient documentation

## 2020-08-22 DIAGNOSIS — R918 Other nonspecific abnormal finding of lung field: Secondary | ICD-10-CM | POA: Diagnosis not present

## 2020-08-22 DIAGNOSIS — N183 Chronic kidney disease, stage 3 unspecified: Secondary | ICD-10-CM | POA: Diagnosis not present

## 2020-08-22 DIAGNOSIS — K219 Gastro-esophageal reflux disease without esophagitis: Secondary | ICD-10-CM | POA: Diagnosis not present

## 2020-08-22 DIAGNOSIS — D631 Anemia in chronic kidney disease: Secondary | ICD-10-CM

## 2020-08-22 DIAGNOSIS — D4620 Refractory anemia with excess of blasts, unspecified: Secondary | ICD-10-CM | POA: Diagnosis not present

## 2020-08-22 LAB — CBC WITH DIFFERENTIAL/PLATELET
Abs Immature Granulocytes: 0.04 10*3/uL (ref 0.00–0.07)
Basophils Absolute: 0 10*3/uL (ref 0.0–0.1)
Basophils Relative: 0 %
Eosinophils Absolute: 0.1 10*3/uL (ref 0.0–0.5)
Eosinophils Relative: 2 %
HCT: 32 % — ABNORMAL LOW (ref 39.0–52.0)
Hemoglobin: 10.3 g/dL — ABNORMAL LOW (ref 13.0–17.0)
Immature Granulocytes: 1 %
Lymphocytes Relative: 27 %
Lymphs Abs: 1.9 10*3/uL (ref 0.7–4.0)
MCH: 31.7 pg (ref 26.0–34.0)
MCHC: 32.2 g/dL (ref 30.0–36.0)
MCV: 98.5 fL (ref 80.0–100.0)
Monocytes Absolute: 2.2 10*3/uL — ABNORMAL HIGH (ref 0.1–1.0)
Monocytes Relative: 32 %
Neutro Abs: 2.6 10*3/uL (ref 1.7–7.7)
Neutrophils Relative %: 38 %
Platelets: 150 10*3/uL (ref 150–400)
RBC: 3.25 MIL/uL — ABNORMAL LOW (ref 4.22–5.81)
RDW: 17.2 % — ABNORMAL HIGH (ref 11.5–15.5)
Smear Review: NORMAL
WBC: 6.9 10*3/uL (ref 4.0–10.5)
nRBC: 0 % (ref 0.0–0.2)

## 2020-08-22 LAB — BASIC METABOLIC PANEL
Anion gap: 9 (ref 5–15)
BUN: 28 mg/dL — ABNORMAL HIGH (ref 8–23)
CO2: 23 mmol/L (ref 22–32)
Calcium: 8.9 mg/dL (ref 8.9–10.3)
Chloride: 107 mmol/L (ref 98–111)
Creatinine, Ser: 1.55 mg/dL — ABNORMAL HIGH (ref 0.61–1.24)
GFR, Estimated: 43 mL/min — ABNORMAL LOW (ref >60)
Glucose, Bld: 108 mg/dL — ABNORMAL HIGH (ref 70–99)
Potassium: 4.2 mmol/L (ref 3.5–5.1)
Sodium: 139 mmol/L (ref 135–145)

## 2020-08-22 NOTE — Progress Notes (Signed)
Patient states no new concerns at the moment. 

## 2020-08-22 NOTE — Assessment & Plan Note (Addendum)
#  Chronic anemia-MDS/CKD-III [s/p BMBx-low-grade myelomonocytic; FEB 2022]; deletion 20.   I reviewed the bone marrow biopsy/pathology with the patient and son in detail.  Continue aranesp every 2 weeks for now.  Await [facility] 1 heme-ordered today.Marland Kitchen  #Today hemoglobin is 10.3; HOLD aranesp.  Continue p.o. iron  # Mild intermittent thrombocytopenia-question ITP [nadir November 2020-80s].  Today platelets 250s ; ok to with asprin.  Stable  #Recent acute CHF-2025% ejection fraction [[facility]]-see below ;on  Lasix 20 mg a day [half pill once a day]; STABLE.   #CKD stage III-GFR-39; stable  #Recent upper GI bleed-likely secondary to angio dysplastic lesion in the small bowel status post cautery.  Continue p.o. iron.  #Left upper lobe cystic lesion-[incidentally] CT scan- AUG 25th- 2021- CT scan-left upper lobe cystic lesion-4.5 cm-/slightly bigger by few mm-stable  #Disposition: #  HOLDAranesp today # in 2 weeks- cbc possible aranesp # in 4 weeks- cbc possible aranesp #  in 6 weeks- cbc possible aranesp # in 8 weeks MD-cbc/bmp; possible aranesp; Dr.B

## 2020-08-22 NOTE — Progress Notes (Signed)
[place] CONSULT NOTE  Patient Care Team: Rusty Aus, MD as PCP - General (Internal Medicine) Alisa Graff, FNP as Nurse Practitioner (Family Medicine) Ubaldo Glassing Javier Docker, MD as Consulting Physician (Cardiology) Erby Pian, MD as Referring Physician (Specialist) Cammie Sickle, MD as Consulting Physician (Hematology and Oncology)  CHIEF COMPLAINTS/PURPOSE OF CONSULTATION:    HISTORY OF PRESENTING ILLNESS:  Andres Gutierrez 87 y.o.  male history of above history of chronic anemia and intermittent thrombocytopenia is here for follow-up/ currently on IV iron and also Aranesp here for follow-up.  In the interim patient was admitted to the hospital for melena; EGD showed angiodysplastic lesion bleeding status post cautery.  Patient's hemoglobin that was around 9.  Did not need any blood transfusion.  Patient's appetite is good with no weight loss nausea vomiting.  No swelling in the legs.  Chronic mild shortness of breath.  Not any worse   Review of Systems  Constitutional: Positive for malaise/fatigue. Negative for chills, diaphoresis, fever and weight loss.  HENT: Negative for nosebleeds and sore throat.   Eyes: Negative for double vision.  Respiratory: Positive for shortness of breath. Negative for cough, hemoptysis, sputum production and wheezing.   Cardiovascular: Negative for chest pain, palpitations, orthopnea and leg swelling.  Gastrointestinal: Negative for abdominal pain, blood in stool, constipation, diarrhea, heartburn, melena, nausea and vomiting.  Genitourinary: Negative for dysuria, frequency and urgency.  Musculoskeletal: Positive for back pain and joint pain.  Skin: Negative.  Negative for itching and rash.  Neurological: Negative for dizziness, tingling, focal weakness, weakness and headaches.  Endo/Heme/Allergies: Does not bruise/bleed easily.  Psychiatric/Behavioral: Negative for depression. The patient is not nervous/anxious and does  not have insomnia.      MEDICAL HISTORY:  Past Medical History:  Diagnosis Date  . Anemia   . Anginal pain ([place])   . Anxiety   . Cardiomyopathy ([place])   . Cerebral amyloid angiopathy ([place])   . CHF (congestive heart failure) ([place])   . Chronic kidney disease    kidney stones  . Coronary artery disease   . Cough   . Dyspnea   . GERD (gastroesophageal reflux disease)   . History of kidney stones   . Hypertension   . Lymphadenopathy, hilar   . MDS (myelodysplastic syndrome) ([place])   . MI (myocardial infarction) ([place])    x 2  . Wheezing     SURGICAL HISTORY: Past Surgical History:  Procedure Laterality Date  . BRONCHIAL NEEDLE ASPIRATION BIOPSY N/A 12/31/2014   Procedure: BRONCHIAL NEEDLE ASPIRATION BIOPSIES from carina;  Surgeon: Flora Lipps, MD;  Location: [facility] ORS;  Service: Cardiopulmonary;  Laterality: N/A;  . CARDIAC CATHETERIZATION N/A 12/28/2015   Procedure: Left Heart Cath and Cors/Grafts Angiography;  Surgeon: Yolonda Kida, MD;  Location: [facility];  Service: Cardiovascular;  Laterality: N/A;  . CATARACT EXTRACTION W/PHACO Left 08/01/2016   Procedure: CATARACT EXTRACTION PHACO AND INTRAOCULAR LENS PLACEMENT ([place]) Left;  Surgeon: Leandrew Koyanagi, MD;  Location: [facility];  Service: Ophthalmology;  Laterality: Left;  . CHOLECYSTECTOMY    . COLON SURGERY    . CORONARY ANGIOPLASTY WITH STENT PLACEMENT    . CORONARY ARTERY BYPASS GRAFT    . CYSTOSCOPY/URETEROSCOPY/HOLMIUM LASER/STENT PLACEMENT Left 03/18/2020   Procedure: CYSTOSCOPY/URETEROSCOPY/HOLMIUM LASER/STENT PLACEMENT;  Surgeon: Billey Co, MD;  Location: [facility] ORS;  Service: Urology;  Laterality: Left;  . ENDOBRONCHIAL ULTRASOUND N/A 12/31/2014   Procedure: ENDOBRONCHIAL ULTRASOUND;  Surgeon: Flora Lipps, MD;  Location: [facility]  ORS;  Service: Cardiopulmonary;  Laterality: N/A;  . ESOPHAGOGASTRODUODENOSCOPY N/A 08/15/2020   Procedure: ESOPHAGOGASTRODUODENOSCOPY (EGD);  Surgeon: [place],  Benay Pike, MD;  Location: [facility] ENDOSCOPY;  Service: Gastroenterology;  Laterality: N/A;  . ESOPHAGOGASTRODUODENOSCOPY (EGD) WITH PROPOFOL N/A 06/20/2015   Procedure: ESOPHAGOGASTRODUODENOSCOPY (EGD) WITH PROPOFOL;  Surgeon: Lollie Sails, MD;  Location: [facility] ENDOSCOPY;  Service: Endoscopy;  Laterality: N/A;  . IR CATHETER TUBE CHANGE  10/26/2019  . IR CHOLANGIOGRAM EXISTING TUBE  08/13/2019    SOCIAL HISTORY: lives in [place]; alone. 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  Social History   Socioeconomic History  . Marital status: Widowed    Spouse name: Not on file  . Number of children: 2  . Years of education: college  . Highest education level: Some college, no degree  Occupational History  . Occupation: retired  Tobacco Use  . Smoking status: Former Smoker    Quit date: 06/27/1977    Years since quitting: 43.1  . Smokeless tobacco: Current User    Types: Chew  Vaping Use  . Vaping Use: Never used  Substance and Sexual Activity  . Alcohol use: Yes    Comment: occational almost rare once a year  . Drug use: No  . Sexual activity: Not Currently    Birth control/protection: Abstinence  Other Topics Concern  . Not on file  Social History Narrative  . Not on file   Social Determinants of Health   Financial Resource Strain: Not on file  Food Insecurity: Not on file  Transportation Needs: Not on file  Physical Activity: Not on file  Stress: Not on file  Social Connections: Not on file  Intimate Partner Violence: Not on file    FAMILY HISTORY: Family History  Problem Relation Age of Onset  . CAD Mother   . Colon cancer Father     ALLERGIES:  is allergic to levaquin [levofloxacin], escitalopram, 5ht3 receptor antagonists, diltiazem, diphenhydramine hcl, maxidex [dexamethasone], prednisone, serotonin, vytorin [ezetimibe-simvastatin], and zocor [simvastatin].  MEDICATIONS:  Current Outpatient Medications  Medication Sig Dispense Refill  . atorvastatin (LIPITOR) 40 MG tablet Take 1  tablet (40 mg total) by mouth daily with supper.    . digoxin (LANOXIN) 0.125 MG tablet Take 0.0625 mg by mouth daily.    . famotidine (PEPCID) 20 MG tablet Take 20 mg by mouth at bedtime.    . feeding supplement (ENSURE ENLIVE / ENSURE PLUS) LIQD Take 237 mLs by mouth 2 (two) times daily between meals. 10000 mL 0  . ferrous sulfate 325 (65 FE) MG tablet Take 1 tablet (325 mg total) by mouth every Monday, Wednesday, and Friday. With supper    . furosemide (LASIX) 20 MG tablet Take 1 tablet (20 mg total) by mouth daily as needed (for weight gain of 3lbs in 1 day or 5 lbs in 2 days or shortness of breath or leg swelling). 30 tablet 0  . metoprolol tartrate (LOPRESSOR) 25 MG tablet Take 12.5 mg by mouth at bedtime.    . Multiple Vitamin (MULTIVITAMIN) tablet Take 1 tablet by mouth daily.    . pantoprazole (PROTONIX) 40 MG tablet Take 1 tablet (40 mg total) by mouth 2 (two) times daily. 60 tablet 1  . polyethylene glycol (MIRALAX / GLYCOLAX) 17 g packet Take 17 g by mouth daily as needed for mild constipation. 14 each 0  . ranolazine (RANEXA) 1000 MG SR tablet Take 500 mg by mouth 2 (two) times daily.     . sucralfate (CARAFATE) 1 G tablet Take  1 g by mouth 2 (two) times daily.     . tamsulosin (FLOMAX) 0.4 MG CAPS capsule Take 0.4 mg by mouth daily after supper.     . vitamin B-12 (CYANOCOBALAMIN) 500 MCG tablet Take 500 mcg by mouth daily.     No current facility-administered medications for this visit.     PHYSICAL EXAMINATION: ECOG PERFORMANCE STATUS: 0 - Asymptomatic  Vitals:   08/22/20 1011  BP: 139/61  Pulse: 79  Resp: 20  Temp: 97.8 F (36.6 C)  SpO2: 100%   Filed Weights   08/22/20 1011  Weight: 186 lb (84.4 kg)    Physical Exam Constitutional:      Comments: Elderly Caucasian male patient. He accompanied by his son.  Patient ambulating independently.   HENT:     Head: Normocephalic and atraumatic.     Mouth/Throat:     Pharynx: No oropharyngeal exudate.  Eyes:      Pupils: Pupils are equal, round, and reactive to light.  Cardiovascular:     Rate and Rhythm: Normal rate and regular rhythm.  Pulmonary:     Effort: No respiratory distress.     Breath sounds: Normal breath sounds. No wheezing.  Abdominal:     General: Bowel sounds are normal. There is no distension.     Palpations: Abdomen is soft. There is no mass.     Tenderness: There is no abdominal tenderness. There is no guarding or rebound.  Musculoskeletal:        General: No tenderness. Normal range of motion.     Cervical back: Normal range of motion and neck supple.  Skin:    General: Skin is warm.  Neurological:     Mental Status: He is alert and oriented to person, place, and time.  Psychiatric:        Mood and Affect: Affect normal.    .   LABORATORY DATA:  I have reviewed the data as listed Lab Results  Component Value Date   WBC 6.9 08/22/2020   HGB 10.3 (L) 08/22/2020   HCT 32.0 (L) 08/22/2020   MCV 98.5 08/22/2020   PLT 150 08/22/2020   Recent Labs    10/21/19 1231 11/06/19 1429 11/11/19 1104 01/07/20 0916 02/18/20 1036 07/18/20 1500 07/22/20 0951 08/12/20 1553 08/13/20 0532 08/15/20 0552 08/22/20 0945  NA  --   --  139 137   < > 134*   < > 134* 133* 136 139  K  --   --  4.6 3.8   < > 4.2   < > 4.1 4.2 4.7 4.2  CL  --   --  104 104   < > 103   < > 107 107 108 107  CO2  --   --  26 23   < > 20*   < > 18* 19* 21* 23  GLUCOSE  --   --  112* 119*   < > 97   < > 106* 86 80 108*  BUN  --   --  21 23   < > 25*   < > 24* 22 17 28*  CREATININE  --  1.90* 1.51* 1.58*   < > 1.73*   < > 1.70* 1.53* 1.61* 1.55*  CALCIUM  --   --  9.0 8.7*   < > 9.2   < > 8.6* 8.3* 8.1* 8.9  GFRNONAA  --  31* 41* 39*   < > 38*   < > 39* 44* 41* 43*  GFRAA  --  36* 48* 45*  --   --   --   --   --   --   --   PROT 7.7  --   --   --    < > 7.3  --  7.0 6.2*  --   --   ALBUMIN 4.4  --   --   --    < > 4.4  --  4.1 3.6  --   --   AST 23  --   --   --    < > 22  --  22 19  --   --   ALT 28   --   --   --    < > 13  --  14 10  --   --   ALKPHOS 56  --   --   --    < > 60  --  64 52  --   --   BILITOT 1.4*  --   --   --    < > 1.0  --  0.7 0.9  --   --   BILIDIR 0.2  --   --   --   --   --   --   --   --   --   --   IBILI 1.2*  --   --   --   --   --   --   --   --   --   --    < > = values in this interval not displayed.   ASSESSMENT & PLAN:   MDS (myelodysplastic syndrome), low grade (HCC) #Chronic anemia-MDS/CKD-III [s/p BMBx-low-grade myelomonocytic; FEB 2022]; deletion 20.   I reviewed the bone marrow biopsy/pathology with the patient and son in detail.  Continue aranesp every 2 weeks for now.  Await foundation 1 heme-ordered today.Marland Kitchen  #Today hemoglobin is 10.3; HOLD aranesp.  Continue p.o. iron  # Mild intermittent thrombocytopenia-question ITP [nadir November 2020-80s].  Today platelets 250s ; ok to with asprin.  Stable  #Recent acute CHF-2025% ejection fraction [[facility]]-see below ;on  Lasix 20 mg a day [half pill once a day]; STABLE.   #CKD stage III-GFR-39; stable  #Recent upper GI bleed-likely secondary to angio dysplastic lesion in the small bowel status post cautery.  Continue p.o. iron.  #Left upper lobe cystic lesion-[incidentally] CT scan- AUG 25th- 2021- CT scan-left upper lobe cystic lesion-4.5 cm-/slightly bigger by few mm-stable  #Disposition: #  HOLDAranesp today # in 2 weeks- cbc possible aranesp # in 4 weeks- cbc possible aranesp #  in 6 weeks- cbc possible aranesp # in 8 weeks MD-cbc/bmp; possible aranesp; Dr.B        Cammie Sickle, MD 08/22/2020 1:14 PM

## 2020-08-23 DIAGNOSIS — K2960 Other gastritis without bleeding: Secondary | ICD-10-CM | POA: Diagnosis not present

## 2020-08-23 DIAGNOSIS — I482 Chronic atrial fibrillation, unspecified: Secondary | ICD-10-CM | POA: Diagnosis not present

## 2020-08-23 DIAGNOSIS — D696 Thrombocytopenia, unspecified: Secondary | ICD-10-CM | POA: Diagnosis not present

## 2020-08-23 DIAGNOSIS — D500 Iron deficiency anemia secondary to blood loss (chronic): Secondary | ICD-10-CM | POA: Diagnosis not present

## 2020-08-23 DIAGNOSIS — I420 Dilated cardiomyopathy: Secondary | ICD-10-CM | POA: Diagnosis not present

## 2020-08-23 DIAGNOSIS — K5521 Angiodysplasia of colon with hemorrhage: Secondary | ICD-10-CM | POA: Insufficient documentation

## 2020-08-25 DIAGNOSIS — D469 Myelodysplastic syndrome, unspecified: Secondary | ICD-10-CM | POA: Diagnosis not present

## 2020-08-25 DIAGNOSIS — K5521 Angiodysplasia of colon with hemorrhage: Secondary | ICD-10-CM | POA: Diagnosis not present

## 2020-08-25 DIAGNOSIS — D500 Iron deficiency anemia secondary to blood loss (chronic): Secondary | ICD-10-CM | POA: Diagnosis not present

## 2020-08-30 DIAGNOSIS — D4620 Refractory anemia with excess of blasts, unspecified: Secondary | ICD-10-CM | POA: Diagnosis not present

## 2020-08-31 ENCOUNTER — Encounter: Payer: Self-pay | Admitting: Internal Medicine

## 2020-08-31 DIAGNOSIS — L97511 Non-pressure chronic ulcer of other part of right foot limited to breakdown of skin: Secondary | ICD-10-CM | POA: Diagnosis not present

## 2020-08-31 DIAGNOSIS — L97521 Non-pressure chronic ulcer of other part of left foot limited to breakdown of skin: Secondary | ICD-10-CM | POA: Diagnosis not present

## 2020-09-05 ENCOUNTER — Inpatient Hospital Stay: Payer: PPO

## 2020-09-06 ENCOUNTER — Inpatient Hospital Stay: Payer: PPO

## 2020-09-06 ENCOUNTER — Other Ambulatory Visit: Payer: Self-pay

## 2020-09-06 VITALS — BP 114/56 | HR 62

## 2020-09-06 DIAGNOSIS — D4620 Refractory anemia with excess of blasts, unspecified: Secondary | ICD-10-CM

## 2020-09-06 DIAGNOSIS — D500 Iron deficiency anemia secondary to blood loss (chronic): Secondary | ICD-10-CM

## 2020-09-06 DIAGNOSIS — D472 Monoclonal gammopathy: Secondary | ICD-10-CM | POA: Diagnosis not present

## 2020-09-06 DIAGNOSIS — D631 Anemia in chronic kidney disease: Secondary | ICD-10-CM

## 2020-09-06 LAB — CBC WITH DIFFERENTIAL/PLATELET
Abs Immature Granulocytes: 0 10*3/uL (ref 0.00–0.07)
Basophils Absolute: 0 10*3/uL (ref 0.0–0.1)
Basophils Relative: 0 %
Eosinophils Absolute: 0 10*3/uL (ref 0.0–0.5)
Eosinophils Relative: 0 %
HCT: 29.5 % — ABNORMAL LOW (ref 39.0–52.0)
Hemoglobin: 9.6 g/dL — ABNORMAL LOW (ref 13.0–17.0)
Lymphocytes Relative: 31 %
Lymphs Abs: 2.4 10*3/uL (ref 0.7–4.0)
MCH: 31.5 pg (ref 26.0–34.0)
MCHC: 32.5 g/dL (ref 30.0–36.0)
MCV: 96.7 fL (ref 80.0–100.0)
Monocytes Absolute: 2.5 10*3/uL — ABNORMAL HIGH (ref 0.1–1.0)
Monocytes Relative: 32 %
Neutro Abs: 2.8 10*3/uL (ref 1.7–7.7)
Neutrophils Relative %: 37 %
Platelets: 112 10*3/uL — ABNORMAL LOW (ref 150–400)
RBC: 3.05 MIL/uL — ABNORMAL LOW (ref 4.22–5.81)
RDW: 17 % — ABNORMAL HIGH (ref 11.5–15.5)
Smear Review: DECREASED
WBC: 7.7 10*3/uL (ref 4.0–10.5)
nRBC: 0 % (ref 0.0–0.2)

## 2020-09-06 MED ORDER — DARBEPOETIN ALFA 300 MCG/0.6ML IJ SOSY
300.0000 ug | PREFILLED_SYRINGE | Freq: Once | INTRAMUSCULAR | Status: AC
  Administered 2020-09-06: 300 ug via SUBCUTANEOUS
  Filled 2020-09-06: qty 0.6

## 2020-09-06 NOTE — Progress Notes (Signed)
hgb 9.6 per lab

## 2020-09-19 ENCOUNTER — Inpatient Hospital Stay: Payer: PPO

## 2020-09-19 DIAGNOSIS — L97521 Non-pressure chronic ulcer of other part of left foot limited to breakdown of skin: Secondary | ICD-10-CM | POA: Diagnosis not present

## 2020-09-19 DIAGNOSIS — L97511 Non-pressure chronic ulcer of other part of right foot limited to breakdown of skin: Secondary | ICD-10-CM | POA: Diagnosis not present

## 2020-09-19 DIAGNOSIS — G609 Hereditary and idiopathic neuropathy, unspecified: Secondary | ICD-10-CM | POA: Diagnosis not present

## 2020-09-20 ENCOUNTER — Inpatient Hospital Stay: Payer: PPO | Attending: Internal Medicine

## 2020-09-20 ENCOUNTER — Inpatient Hospital Stay: Payer: PPO

## 2020-09-20 ENCOUNTER — Other Ambulatory Visit: Payer: Self-pay

## 2020-09-20 VITALS — BP 122/58 | HR 67

## 2020-09-20 DIAGNOSIS — Z79899 Other long term (current) drug therapy: Secondary | ICD-10-CM | POA: Diagnosis not present

## 2020-09-20 DIAGNOSIS — D472 Monoclonal gammopathy: Secondary | ICD-10-CM | POA: Diagnosis not present

## 2020-09-20 DIAGNOSIS — D500 Iron deficiency anemia secondary to blood loss (chronic): Secondary | ICD-10-CM

## 2020-09-20 DIAGNOSIS — I129 Hypertensive chronic kidney disease with stage 1 through stage 4 chronic kidney disease, or unspecified chronic kidney disease: Secondary | ICD-10-CM | POA: Insufficient documentation

## 2020-09-20 DIAGNOSIS — D4620 Refractory anemia with excess of blasts, unspecified: Secondary | ICD-10-CM

## 2020-09-20 DIAGNOSIS — N183 Chronic kidney disease, stage 3 unspecified: Secondary | ICD-10-CM

## 2020-09-20 DIAGNOSIS — D631 Anemia in chronic kidney disease: Secondary | ICD-10-CM | POA: Diagnosis not present

## 2020-09-20 LAB — CBC WITH DIFFERENTIAL/PLATELET
Abs Immature Granulocytes: 0.1 10*3/uL — ABNORMAL HIGH (ref 0.00–0.07)
Basophils Absolute: 0 10*3/uL (ref 0.0–0.1)
Basophils Relative: 0 %
Eosinophils Absolute: 0.1 10*3/uL (ref 0.0–0.5)
Eosinophils Relative: 1 %
HCT: 28.9 % — ABNORMAL LOW (ref 39.0–52.0)
Hemoglobin: 9.1 g/dL — ABNORMAL LOW (ref 13.0–17.0)
Immature Granulocytes: 1 %
Lymphocytes Relative: 27 %
Lymphs Abs: 2 10*3/uL (ref 0.7–4.0)
MCH: 30.4 pg (ref 26.0–34.0)
MCHC: 31.5 g/dL (ref 30.0–36.0)
MCV: 96.7 fL (ref 80.0–100.0)
Monocytes Absolute: 2.7 10*3/uL — ABNORMAL HIGH (ref 0.1–1.0)
Monocytes Relative: 37 %
Neutro Abs: 2.5 10*3/uL (ref 1.7–7.7)
Neutrophils Relative %: 34 %
Platelets: 162 10*3/uL (ref 150–400)
RBC: 2.99 MIL/uL — ABNORMAL LOW (ref 4.22–5.81)
RDW: 18.1 % — ABNORMAL HIGH (ref 11.5–15.5)
WBC: 7.4 10*3/uL (ref 4.0–10.5)
nRBC: 0 % (ref 0.0–0.2)

## 2020-09-20 LAB — BASIC METABOLIC PANEL
Anion gap: 11 (ref 5–15)
BUN: 21 mg/dL (ref 8–23)
CO2: 22 mmol/L (ref 22–32)
Calcium: 8.8 mg/dL — ABNORMAL LOW (ref 8.9–10.3)
Chloride: 103 mmol/L (ref 98–111)
Creatinine, Ser: 1.76 mg/dL — ABNORMAL HIGH (ref 0.61–1.24)
GFR, Estimated: 37 mL/min — ABNORMAL LOW (ref >60)
Glucose, Bld: 105 mg/dL — ABNORMAL HIGH (ref 70–99)
Potassium: 4.1 mmol/L (ref 3.5–5.1)
Sodium: 136 mmol/L (ref 135–145)

## 2020-09-20 MED ORDER — HEPARIN SOD (PORK) LOCK FLUSH 100 UNIT/ML IV SOLN
250.0000 [IU] | Freq: Once | INTRAVENOUS | Status: DC
  Filled 2020-09-20: qty 5

## 2020-09-20 MED ORDER — DARBEPOETIN ALFA 300 MCG/0.6ML IJ SOSY
300.0000 ug | PREFILLED_SYRINGE | Freq: Once | INTRAMUSCULAR | Status: AC
  Administered 2020-09-20: 300 ug via SUBCUTANEOUS
  Filled 2020-09-20: qty 0.6

## 2020-10-03 ENCOUNTER — Inpatient Hospital Stay: Payer: PPO

## 2020-10-03 ENCOUNTER — Telehealth: Payer: Self-pay | Admitting: Internal Medicine

## 2020-10-03 ENCOUNTER — Other Ambulatory Visit: Payer: Self-pay

## 2020-10-03 ENCOUNTER — Observation Stay
Admission: EM | Admit: 2020-10-03 | Discharge: 2020-10-04 | Disposition: A | Discharge status: Home / Self Care | Payer: PPO | Attending: Internal Medicine | Admitting: Internal Medicine

## 2020-10-03 ENCOUNTER — Emergency Department: Payer: PPO

## 2020-10-03 ENCOUNTER — Encounter: Payer: Self-pay | Admitting: Emergency Medicine

## 2020-10-03 DIAGNOSIS — N1832 Chronic kidney disease, stage 3b: Secondary | ICD-10-CM | POA: Diagnosis not present

## 2020-10-03 DIAGNOSIS — R109 Unspecified abdominal pain: Secondary | ICD-10-CM | POA: Diagnosis not present

## 2020-10-03 DIAGNOSIS — I959 Hypotension, unspecified: Secondary | ICD-10-CM | POA: Diagnosis not present

## 2020-10-03 DIAGNOSIS — N200 Calculus of kidney: Secondary | ICD-10-CM | POA: Diagnosis not present

## 2020-10-03 DIAGNOSIS — R079 Chest pain, unspecified: Secondary | ICD-10-CM | POA: Diagnosis not present

## 2020-10-03 DIAGNOSIS — Z87891 Personal history of nicotine dependence: Secondary | ICD-10-CM | POA: Insufficient documentation

## 2020-10-03 DIAGNOSIS — N281 Cyst of kidney, acquired: Secondary | ICD-10-CM | POA: Diagnosis not present

## 2020-10-03 DIAGNOSIS — I10 Essential (primary) hypertension: Secondary | ICD-10-CM | POA: Diagnosis present

## 2020-10-03 DIAGNOSIS — I2510 Atherosclerotic heart disease of native coronary artery without angina pectoris: Secondary | ICD-10-CM | POA: Diagnosis present

## 2020-10-03 DIAGNOSIS — I5023 Acute on chronic systolic (congestive) heart failure: Principal | ICD-10-CM | POA: Insufficient documentation

## 2020-10-03 DIAGNOSIS — E785 Hyperlipidemia, unspecified: Secondary | ICD-10-CM | POA: Diagnosis not present

## 2020-10-03 DIAGNOSIS — Z20822 Contact with and (suspected) exposure to covid-19: Secondary | ICD-10-CM | POA: Insufficient documentation

## 2020-10-03 DIAGNOSIS — R0902 Hypoxemia: Secondary | ICD-10-CM | POA: Diagnosis not present

## 2020-10-03 DIAGNOSIS — I130 Hypertensive heart and chronic kidney disease with heart failure and stage 1 through stage 4 chronic kidney disease, or unspecified chronic kidney disease: Secondary | ICD-10-CM | POA: Diagnosis not present

## 2020-10-03 DIAGNOSIS — N400 Enlarged prostate without lower urinary tract symptoms: Secondary | ICD-10-CM | POA: Diagnosis present

## 2020-10-03 DIAGNOSIS — K219 Gastro-esophageal reflux disease without esophagitis: Secondary | ICD-10-CM | POA: Diagnosis present

## 2020-10-03 DIAGNOSIS — I482 Chronic atrial fibrillation, unspecified: Secondary | ICD-10-CM | POA: Diagnosis present

## 2020-10-03 DIAGNOSIS — D500 Iron deficiency anemia secondary to blood loss (chronic): Secondary | ICD-10-CM | POA: Diagnosis not present

## 2020-10-03 DIAGNOSIS — D4620 Refractory anemia with excess of blasts, unspecified: Secondary | ICD-10-CM | POA: Diagnosis not present

## 2020-10-03 DIAGNOSIS — I509 Heart failure, unspecified: Secondary | ICD-10-CM

## 2020-10-03 DIAGNOSIS — J90 Pleural effusion, not elsewhere classified: Secondary | ICD-10-CM | POA: Diagnosis not present

## 2020-10-03 DIAGNOSIS — R0602 Shortness of breath: Secondary | ICD-10-CM | POA: Diagnosis not present

## 2020-10-03 DIAGNOSIS — I4891 Unspecified atrial fibrillation: Secondary | ICD-10-CM | POA: Diagnosis not present

## 2020-10-03 DIAGNOSIS — J189 Pneumonia, unspecified organism: Secondary | ICD-10-CM | POA: Diagnosis not present

## 2020-10-03 DIAGNOSIS — D46Z Other myelodysplastic syndromes: Secondary | ICD-10-CM | POA: Diagnosis present

## 2020-10-03 DIAGNOSIS — J439 Emphysema, unspecified: Secondary | ICD-10-CM | POA: Diagnosis not present

## 2020-10-03 DIAGNOSIS — Z79899 Other long term (current) drug therapy: Secondary | ICD-10-CM | POA: Diagnosis not present

## 2020-10-03 DIAGNOSIS — Z951 Presence of aortocoronary bypass graft: Secondary | ICD-10-CM | POA: Insufficient documentation

## 2020-10-03 DIAGNOSIS — I110 Hypertensive heart disease with heart failure: Secondary | ICD-10-CM | POA: Diagnosis not present

## 2020-10-03 DIAGNOSIS — R1084 Generalized abdominal pain: Secondary | ICD-10-CM | POA: Diagnosis not present

## 2020-10-03 DIAGNOSIS — N183 Chronic kidney disease, stage 3 unspecified: Secondary | ICD-10-CM | POA: Diagnosis present

## 2020-10-03 DIAGNOSIS — M4316 Spondylolisthesis, lumbar region: Secondary | ICD-10-CM | POA: Diagnosis not present

## 2020-10-03 LAB — CBC
HCT: 30.5 % — ABNORMAL LOW (ref 39.0–52.0)
Hemoglobin: 10.1 g/dL — ABNORMAL LOW (ref 13.0–17.0)
MCH: 31.2 pg (ref 26.0–34.0)
MCHC: 33.1 g/dL (ref 30.0–36.0)
MCV: 94.1 fL (ref 80.0–100.0)
Platelets: 179 10*3/uL (ref 150–400)
RBC: 3.24 MIL/uL — ABNORMAL LOW (ref 4.22–5.81)
RDW: 18.1 % — ABNORMAL HIGH (ref 11.5–15.5)
WBC: 8.5 10*3/uL (ref 4.0–10.5)
nRBC: 0 % (ref 0.0–0.2)

## 2020-10-03 LAB — TROPONIN I (HIGH SENSITIVITY)
Troponin I (High Sensitivity): 17 ng/L (ref <18)
Troponin I (High Sensitivity): 29 ng/L — ABNORMAL HIGH
Troponin I (High Sensitivity): 31 ng/L — ABNORMAL HIGH (ref <18)

## 2020-10-03 LAB — COMPREHENSIVE METABOLIC PANEL WITH GFR
ALT: 13 U/L (ref 0–44)
AST: 22 U/L (ref 15–41)
Albumin: 4.5 g/dL (ref 3.5–5.0)
Alkaline Phosphatase: 69 U/L (ref 38–126)
Anion gap: 11 (ref 5–15)
BUN: 20 mg/dL (ref 8–23)
CO2: 21 mmol/L — ABNORMAL LOW (ref 22–32)
Calcium: 9.3 mg/dL (ref 8.9–10.3)
Chloride: 101 mmol/L (ref 98–111)
Creatinine, Ser: 1.71 mg/dL — ABNORMAL HIGH (ref 0.61–1.24)
GFR, Estimated: 38 mL/min — ABNORMAL LOW
Glucose, Bld: 105 mg/dL — ABNORMAL HIGH (ref 70–99)
Potassium: 3.9 mmol/L (ref 3.5–5.1)
Sodium: 133 mmol/L — ABNORMAL LOW (ref 135–145)
Total Bilirubin: 1.1 mg/dL (ref 0.3–1.2)
Total Protein: 7.9 g/dL (ref 6.5–8.1)

## 2020-10-03 LAB — URINALYSIS, COMPLETE (UACMP) WITH MICROSCOPIC
Bacteria, UA: NONE SEEN
Bilirubin Urine: NEGATIVE
Glucose, UA: NEGATIVE mg/dL
Hgb urine dipstick: NEGATIVE
Ketones, ur: NEGATIVE mg/dL
Leukocytes,Ua: NEGATIVE
Nitrite: NEGATIVE
Protein, ur: NEGATIVE mg/dL
Specific Gravity, Urine: 1.014 (ref 1.005–1.030)
Squamous Epithelial / HPF: NONE SEEN (ref 0–5)
pH: 5 (ref 5.0–8.0)

## 2020-10-03 LAB — RESP PANEL BY RT-PCR (FLU A&B, COVID) ARPGX2
Influenza A by PCR: NEGATIVE
Influenza B by PCR: NEGATIVE
SARS Coronavirus 2 by RT PCR: NEGATIVE

## 2020-10-03 LAB — LIPASE, BLOOD: Lipase: 41 U/L (ref 11–51)

## 2020-10-03 LAB — MAGNESIUM: Magnesium: 1.5 mg/dL — ABNORMAL LOW (ref 1.7–2.4)

## 2020-10-03 LAB — PROTIME-INR
INR: 1.1 (ref 0.8–1.2)
Prothrombin Time: 14.4 seconds (ref 11.4–15.2)

## 2020-10-03 LAB — DIGOXIN LEVEL: Digoxin Level: 0.6 ng/mL — ABNORMAL LOW (ref 0.8–2.0)

## 2020-10-03 LAB — BRAIN NATRIURETIC PEPTIDE: B Natriuretic Peptide: 896.5 pg/mL — ABNORMAL HIGH (ref 0.0–100.0)

## 2020-10-03 MED ORDER — ENOXAPARIN SODIUM 40 MG/0.4ML IJ SOSY
40.0000 mg | PREFILLED_SYRINGE | INTRAMUSCULAR | Status: DC
  Administered 2020-10-03: 40 mg via SUBCUTANEOUS
  Filled 2020-10-03: qty 0.4

## 2020-10-03 MED ORDER — DM-GUAIFENESIN ER 30-600 MG PO TB12: 1.0000 | ORAL_TABLET | Freq: Two times a day (BID) | ORAL | Status: DC | PRN

## 2020-10-03 MED ORDER — MAGNESIUM SULFATE 2 GM/50ML IV SOLN
2.0000 g | Freq: Once | INTRAVENOUS | Status: AC
  Administered 2020-10-03: 2 g via INTRAVENOUS
  Filled 2020-10-03 (×3): qty 50

## 2020-10-03 MED ORDER — VITAMIN B-12 1000 MCG PO TABS
500.0000 ug | ORAL_TABLET | Freq: Every day | ORAL | Status: DC
  Administered 2020-10-04: 500 ug via ORAL
  Filled 2020-10-03: qty 1

## 2020-10-03 MED ORDER — HYDROXYZINE HCL 10 MG PO TABS
10.0000 mg | ORAL_TABLET | Freq: Three times a day (TID) | ORAL | Status: DC | PRN
  Filled 2020-10-03: qty 1

## 2020-10-03 MED ORDER — ADULT MULTIVITAMIN W/MINERALS CH
1.0000 | ORAL_TABLET | Freq: Every day | ORAL | Status: DC
  Administered 2020-10-04: 1 via ORAL
  Filled 2020-10-03: qty 1

## 2020-10-03 MED ORDER — FERROUS SULFATE 325 (65 FE) MG PO TABS
325.0000 mg | ORAL_TABLET | ORAL | Status: DC
  Administered 2020-10-03: 325 mg via ORAL
  Filled 2020-10-03: qty 1

## 2020-10-03 MED ORDER — POLYETHYLENE GLYCOL 3350 17 G PO PACK: 17.0000 g | PACK | Freq: Every day | ORAL | Status: DC | PRN

## 2020-10-03 MED ORDER — HYDRALAZINE HCL 20 MG/ML IJ SOLN: 5.0000 mg | INTRAMUSCULAR | Status: DC | PRN

## 2020-10-03 MED ORDER — FAMOTIDINE 20 MG PO TABS
20.0000 mg | ORAL_TABLET | Freq: Every day | ORAL | Status: DC
  Administered 2020-10-03: 20 mg via ORAL
  Filled 2020-10-03: qty 1

## 2020-10-03 MED ORDER — RANOLAZINE ER 500 MG PO TB12
500.0000 mg | ORAL_TABLET | Freq: Two times a day (BID) | ORAL | Status: DC
  Administered 2020-10-03 – 2020-10-04 (×2): 500 mg via ORAL
  Filled 2020-10-03 (×3): qty 1

## 2020-10-03 MED ORDER — TAMSULOSIN HCL 0.4 MG PO CAPS: 0.4000 mg | ORAL_CAPSULE | Freq: Every day | ORAL | Status: DC

## 2020-10-03 MED ORDER — DIGOXIN 125 MCG PO TABS
0.0625 mg | ORAL_TABLET | Freq: Every day | ORAL | Status: DC
  Administered 2020-10-03 – 2020-10-04 (×2): 0.0625 mg via ORAL
  Filled 2020-10-03 (×2): qty 0.5

## 2020-10-03 MED ORDER — ALBUTEROL SULFATE HFA 108 (90 BASE) MCG/ACT IN AERS
2.0000 | INHALATION_SPRAY | RESPIRATORY_TRACT | Status: DC | PRN
Start: 2020-10-03 — End: 2020-10-04
  Filled 2020-10-03: qty 6.7

## 2020-10-03 MED ORDER — MORPHINE SULFATE (PF) 2 MG/ML IV SOLN: 0.5000 mg | INTRAVENOUS | Status: DC | PRN

## 2020-10-03 MED ORDER — ATORVASTATIN CALCIUM 20 MG PO TABS: 40.0000 mg | ORAL_TABLET | Freq: Every day | ORAL | Status: DC

## 2020-10-03 MED ORDER — METOPROLOL TARTRATE 25 MG PO TABS
12.5000 mg | ORAL_TABLET | Freq: Every day | ORAL | Status: DC
  Administered 2020-10-03: 12.5 mg via ORAL
  Filled 2020-10-03: qty 1

## 2020-10-03 MED ORDER — NITROGLYCERIN 0.4 MG SL SUBL: 0.4000 mg | SUBLINGUAL_TABLET | SUBLINGUAL | Status: DC | PRN

## 2020-10-03 MED ORDER — PANTOPRAZOLE SODIUM 40 MG PO TBEC
40.0000 mg | DELAYED_RELEASE_TABLET | Freq: Two times a day (BID) | ORAL | Status: DC
  Administered 2020-10-03 – 2020-10-04 (×2): 40 mg via ORAL
  Filled 2020-10-03 (×2): qty 1

## 2020-10-03 MED ORDER — ACETAMINOPHEN 325 MG PO TABS
650.0000 mg | ORAL_TABLET | Freq: Four times a day (QID) | ORAL | Status: DC | PRN
  Administered 2020-10-03: 650 mg via ORAL
  Filled 2020-10-03 (×2): qty 2

## 2020-10-03 MED ORDER — ENSURE ENLIVE PO LIQD
237.0000 mL | Freq: Two times a day (BID) | ORAL | Status: DC
  Administered 2020-10-04: 237 mL via ORAL

## 2020-10-03 MED ORDER — FUROSEMIDE 10 MG/ML IJ SOLN
40.0000 mg | Freq: Once | INTRAMUSCULAR | Status: AC
  Administered 2020-10-03: 40 mg via INTRAVENOUS
  Filled 2020-10-03: qty 4

## 2020-10-03 MED ORDER — NITROFURANTOIN MONOHYD MACRO 100 MG PO CAPS
100.0000 mg | ORAL_CAPSULE | Freq: Two times a day (BID) | ORAL | Status: DC
  Administered 2020-10-03 – 2020-10-04 (×3): 100 mg via ORAL
  Filled 2020-10-03 (×4): qty 1

## 2020-10-03 MED ORDER — FUROSEMIDE 10 MG/ML IJ SOLN
40.0000 mg | Freq: Two times a day (BID) | INTRAMUSCULAR | Status: DC
  Administered 2020-10-04: 40 mg via INTRAVENOUS
  Filled 2020-10-03: qty 4

## 2020-10-03 NOTE — Telephone Encounter (Signed)
Patients daughter [name] called to reschedule his 5/24 lab/injection appointment because the patient is in the hospital.  Appointment rescheduled to 5/31.

## 2020-10-03 NOTE — ED Notes (Signed)
Lab called to add labs on to existing blood specimans

## 2020-10-03 NOTE — ED Triage Notes (Signed)
Pt ems from home for abd and chest pain that started this morning. Per daughter pt is also more sob than normal. abd and chest pain right of center and is described as "just standard pain". Pain is intermittent and 3/10.

## 2020-10-03 NOTE — ED Notes (Signed)
Pt presents to ED with c/o of mid epigastric pain and some SOB and chest pains that started this morning. Pt denies N/V/D. Pt states HX of GI bleed but denies any recent black tarry stools. Pt denies any lung issues or smoking. Pt can speak in full sentences at this time without issue. NAD noted. Daughter at bedside.

## 2020-10-03 NOTE — ED Triage Notes (Signed)
Pt comes via EMS with c/o abdominal pain since this am. VSS

## 2020-10-03 NOTE — H&P (Signed)
History and Physical    Andres Gutierrez MVE:[id number] DOB: 1930-07-05 DOA: 10/03/2020  Referring MD/NP/PA:   PCP: Rusty Aus, MD   Patient coming from:  The patient is coming from home.  At baseline, pt is independent for most of ADL.        Chief Complaint: Chest pain, shortness of breath, abdominal pain  HPI: Andres Gutierrez is a 87 y.o. male with medical history significant of hypertension, hyperlipidemia, GERD, anxiety, CAD, CABG, stent placement, MDS, kidney stone, sCHF with EF 35-40, cerebral amyloid angiopathy, ICH 02/27/2020, anemia, chewing tobacco, CKD-IIb, atrial fibrillation not on anticoagulants, BPH, GI bleeding, who presents with chest pain, shortness of breath, abdominal pain  Patient states that his chest pain started this morning, which is located in the lower chest, mild, 3 out of 10 severity, dull, radiating to both arms, associated with mild shortness of breath.  No cough, fever or chills.  Patient also complains of epigastric abdominal pain, denies nausea, vomiting, diarrhea, no dark stool.  Abdominal pain is mild, aching, nonradiating.  Denies symptoms of UTI.  No unilateral weakness.  ED Course: pt was found to have troponin level 17, BNP 896, lipase 41, digoxin level 0.6, pending COVID-19 PCR, renal function stable, temperature normal, blood pressure 121/67, heart rate 87, RR 20, oxygen saturation 96% on room air.  Chest x-ray showed small focus of right basilar opacity, with small bilateral pleural effusion.  CT abdomen/pelvis is negative for acute intra-abdominal issues, but showed bilateral layered pleural effusion. Patient is placed on progressive bed for observation  CT abdomen/pelvis: Bilateral pleural effusions layering dependently, larger on the right than the left, with dependent pulmonary atelectasis. Chronic pleural thickening and calcification in the right lower medial pleural space, unchanged.  No acute intra-abdominal pathology is seen. No evidence of  bowel obstruction or inflammation. No sign of diverticulosis or diverticulitis.  Surgical clips in the right lower quadrant due to previous bowel surgery. No acute complicating feature in that region is noted.  Review of Systems:   General: no fevers, chills, no body weight gain, has fatigue HEENT: no blurry vision, hearing changes or sore throat Respiratory: has dyspnea, no coughing, wheezing CV: has chest pain, no palpitations GI: no nausea, vomiting, has abdominal pain,  No diarrhea, constipation GU: no dysuria, burning on urination, increased urinary frequency, hematuria  Ext: trace leg edema Neuro: no unilateral weakness, numbness, or tingling, no vision change or hearing loss Skin: no rash, no skin tear. MSK: No muscle spasm, no deformity, no limitation of range of movement in spin Heme: No easy bruising.  Travel history: No recent long distant travel.  Allergy:  Allergies  Allergen Reactions  . Levaquin [Levofloxacin]     Andres Gutierrez, her daughter, said Levaquin should be avoided because when patient had Levaquin a few years ago, he had "a bad reaction".  She said he could not walk and could not lift up his feet.  . Escitalopram Other (See Comments)    Reaction:  Makes him feel faint, like he was going to have a heart attack.  . 5ht3 Receptor Antagonists   . Diltiazem Rash  . Diphenhydramine Hcl Other (See Comments)    Reaction:  Rash and fever a long time ago, but has had it since with no problem.  . Maxidex [Dexamethasone] Other (See Comments)    Reaction:  Unknown   . Prednisone Other (See Comments)    Pt states that this medication made him feel crazy.    Marland Kitchen  Serotonin Other (See Comments)    Tried 2 different types, lexapro and another one.  Reaction:  Made him feel like he was having a heart attack.   . Vytorin [Ezetimibe-Simvastatin] Other (See Comments)    Reaction:  Unknown   . Zocor [Simvastatin] Other (See Comments)    Reaction:  Unknown     Past Medical  History:  Diagnosis Date  . Anemia   . Anginal pain ([place])   . Anxiety   . Cardiomyopathy ([place])   . Cerebral amyloid angiopathy ([place])   . CHF (congestive heart failure) ([place])   . Chronic kidney disease    kidney stones  . Coronary artery disease   . Cough   . Dyspnea   . GERD (gastroesophageal reflux disease)   . History of kidney stones   . Hypertension   . Lymphadenopathy, hilar   . MDS (myelodysplastic syndrome) ([place])   . MI (myocardial infarction) ([place])    x 2  . Wheezing     Past Surgical History:  Procedure Laterality Date  . BRONCHIAL NEEDLE ASPIRATION BIOPSY N/A 12/31/2014   Procedure: BRONCHIAL NEEDLE ASPIRATION BIOPSIES from carina;  Surgeon: Flora Lipps, MD;  Location: [facility];  Service: Cardiopulmonary;  Laterality: N/A;  . CARDIAC CATHETERIZATION N/A 12/28/2015   Procedure: Left Heart Cath and Cors/Grafts Angiography;  Surgeon: Yolonda Kida, MD;  Location: [facility];  Service: Cardiovascular;  Laterality: N/A;  . CATARACT EXTRACTION W/PHACO Left 08/01/2016   Procedure: CATARACT EXTRACTION PHACO AND INTRAOCULAR LENS PLACEMENT ([place]) Left;  Surgeon: Leandrew Koyanagi, MD;  Location: [facility];  Service: Ophthalmology;  Laterality: Left;  . CHOLECYSTECTOMY    . COLON SURGERY    . CORONARY ANGIOPLASTY WITH STENT PLACEMENT    . CORONARY ARTERY BYPASS GRAFT    . CYSTOSCOPY/URETEROSCOPY/HOLMIUM LASER/STENT PLACEMENT Left 03/18/2020   Procedure: CYSTOSCOPY/URETEROSCOPY/HOLMIUM LASER/STENT PLACEMENT;  Surgeon: Billey Co, MD;  Location: [facility];  Service: Urology;  Laterality: Left;  . ENDOBRONCHIAL ULTRASOUND N/A 12/31/2014   Procedure: ENDOBRONCHIAL ULTRASOUND;  Surgeon: Flora Lipps, MD;  Location: [facility];  Service: Cardiopulmonary;  Laterality: N/A;  . ESOPHAGOGASTRODUODENOSCOPY N/A 08/15/2020   Procedure: ESOPHAGOGASTRODUODENOSCOPY (EGD);  Surgeon: Toledo, Benay Pike, MD;  Location: [facility];  Service: Gastroenterology;  Laterality:  N/A;  . ESOPHAGOGASTRODUODENOSCOPY (EGD) WITH PROPOFOL N/A 06/20/2015   Procedure: ESOPHAGOGASTRODUODENOSCOPY (EGD) WITH PROPOFOL;  Surgeon: Lollie Sails, MD;  Location: [facility];  Service: Endoscopy;  Laterality: N/A;  . IR CATHETER TUBE CHANGE  10/26/2019  . IR CHOLANGIOGRAM EXISTING TUBE  08/13/2019    Social History:  reports that he quit smoking about 43 years ago. His smokeless tobacco use includes chew. He reports current alcohol use. He reports that he does not use drugs.  Family History:  Family History  Problem Relation Age of Onset  . CAD Mother   . Colon cancer Father      Prior to Admission medications   Medication Sig Start Date End Date Taking? Authorizing Provider  atorvastatin (LIPITOR) 40 MG tablet Take 1 tablet (40 mg total) by mouth daily with supper. 02/27/20   Enzo Bi, MD  digoxin (LANOXIN) 0.125 MG tablet Take 0.0625 mg by mouth daily.    [provider]  famotidine (PEPCID) 20 MG tablet Take 20 mg by mouth at bedtime. 02/04/19   [provider]  feeding supplement (ENSURE ENLIVE / ENSURE PLUS) LIQD Take 237 mLs by mouth 2 (two) times daily between meals. 03/04/20   Lavina Hamman, MD  ferrous sulfate 325 (65 FE) MG tablet Take 1 tablet (325 mg total) by mouth every Monday, Wednesday, and Friday. With supper 02/29/20   Enzo Bi, MD  furosemide (LASIX) 20 MG tablet Take 1 tablet (20 mg total) by mouth daily as needed (for weight gain of 3lbs in 1 day or 5 lbs in 2 days or shortness of breath or leg swelling). 03/04/20   Lavina Hamman, MD  metoprolol tartrate (LOPRESSOR) 25 MG tablet Take 12.5 mg by mouth at bedtime.    [provider]  Multiple Vitamin (MULTIVITAMIN) tablet Take 1 tablet by mouth daily.    [provider]  pantoprazole (PROTONIX) 40 MG tablet Take 1 tablet (40 mg total) by mouth 2 (two) times daily. 08/17/20   Lorella Nimrod, MD  polyethylene glycol (MIRALAX / GLYCOLAX) 17 g packet Take 17 g by mouth daily  as needed for mild constipation. 08/17/20   Lorella Nimrod, MD  ranolazine (RANEXA) 1000 MG SR tablet Take 500 mg by mouth 2 (two) times daily.  01/16/19   [provider]  sucralfate (CARAFATE) 1 G tablet Take 1 g by mouth 2 (two) times daily.     [provider]  tamsulosin (FLOMAX) 0.4 MG CAPS capsule Take 0.4 mg by mouth daily after supper.     [provider]  vitamin B-12 (CYANOCOBALAMIN) 500 MCG tablet Take 500 mcg by mouth daily.    [provider]    Physical Exam: Vitals:   10/03/20 1600 10/03/20 1645 10/03/20 1815 10/03/20 2000  BP: 140/67 123/67 108/60 (!) 126/59  Pulse: 91 84 85 83  Resp: 18 17 17 20   Temp:    97.9 F (36.6 C)  TempSrc:    Oral  SpO2: 98% 96% 98% 95%  Weight:      Height:       General: Not in acute distress HEENT:       Eyes: PERRL, EOMI, no scleral icterus.       ENT: No discharge from the ears and nose, no pharynx injection, no tonsillar enlargement.        Neck: positive JVD, no bruit, no mass felt. Heme: No neck lymph node enlargement. Cardiac: P6/P9, RRR, 2/6 systolic murmurs, No gallops or rubs. Respiratory: No rales, wheezing, rhonchi or rubs. GI: Soft, nondistended, has mild tenderness in epigastric area, no rebound pain, no organomegaly, BS present. GU: No hematuria Ext: has trace leg edema bilaterally. 1+DP/PT pulse bilaterally. Musculoskeletal: No joint deformities, No joint redness or warmth, no limitation of ROM in spin. Skin: No rashes.  Neuro: Alert, oriented X3, cranial nerves II-XII grossly intact, moves all extremities normally. Psych: Patient is not psychotic, no suicidal or hemocidal ideation.  Labs on Admission: I have personally reviewed following labs and imaging studies  CBC: Recent Labs  Lab 10/03/20 1053  WBC 8.5  HGB 10.1*  HCT 30.5*  MCV 94.1  PLT 509   Basic Metabolic Panel: Recent Labs  Lab 10/03/20 1052 10/03/20 1053  NA  --  133*  K  --  3.9  CL  --  101  CO2  --  21*   GLUCOSE  --  105*  BUN  --  20  CREATININE  --  1.71*  CALCIUM  --  9.3  MG 1.5*  --    GFR: Estimated Creatinine Clearance: 35.3 mL/min (A) (by C-G formula based on SCr of 1.71 mg/dL (H)). Liver Function Tests: Recent Labs  Lab 10/03/20 1053  AST 22  ALT  13  ALKPHOS 69  BILITOT 1.1  PROT 7.9  ALBUMIN 4.5   Recent Labs  Lab 10/03/20 1053  LIPASE 41   No results for input(s): AMMONIA in the last 168 hours. Coagulation Profile: Recent Labs  Lab 10/03/20 1921  INR 1.1   Cardiac Enzymes: No results for input(s): CKTOTAL, CKMB, CKMBINDEX, TROPONINI in the last 168 hours. BNP (last 3 results) No results for input(s): PROBNP in the last 8760 hours. HbA1C: No results for input(s): HGBA1C in the last 72 hours. CBG: No results for input(s): GLUCAP in the last 168 hours. Lipid Profile: No results for input(s): CHOL, HDL, LDLCALC, TRIG, CHOLHDL, LDLDIRECT in the last 72 hours. Thyroid Function Tests: No results for input(s): TSH, T4TOTAL, FREET4, T3FREE, THYROIDAB in the last 72 hours. Anemia Panel: No results for input(s): VITAMINB12, FOLATE, FERRITIN, TIBC, IRON, RETICCTPCT in the last 72 hours. Urine analysis:    Component Value Date/Time   COLORURINE YELLOW (A) 10/03/2020 1306   APPEARANCEUR CLEAR (A) 10/03/2020 1306   LABSPEC 1.014 10/03/2020 1306   PHURINE 5.0 10/03/2020 1306   GLUCOSEU NEGATIVE 10/03/2020 1306   HGBUR NEGATIVE 10/03/2020 1306   [place] 10/03/2020 1306   KETONESUR NEGATIVE 10/03/2020 1306   PROTEINUR NEGATIVE 10/03/2020 1306   UROBILINOGEN 0.2 09/11/2010 0647   NITRITE NEGATIVE 10/03/2020 1306   LEUKOCYTESUR NEGATIVE 10/03/2020 1306   Sepsis Labs: @LABRCNTIP (procalcitonin:4,lacticidven:4) ) Recent Results (from the past 240 hour(s))  Resp Panel by RT-PCR (Flu A&B, Covid) Nasopharyngeal Swab     Status: None   Collection Time: 10/03/20  3:46 PM   Specimen: Nasopharyngeal Swab; Nasopharyngeal(NP) swabs in vial transport medium   Result Value Ref Range Status   SARS Coronavirus 2 by RT PCR NEGATIVE NEGATIVE Final    Comment: (NOTE) SARS-CoV-2 target nucleic acids are NOT DETECTED.  The SARS-CoV-2 RNA is generally detectable in upper respiratory specimens during the acute phase of infection. The lowest concentration of SARS-CoV-2 viral copies this assay can detect is 138 copies/mL. A negative result does not preclude SARS-Cov-2 infection and should not be used as the sole basis for treatment or other patient management decisions. A negative result may occur with  improper specimen collection/handling, submission of specimen other than nasopharyngeal swab, presence of viral mutation(s) within the areas targeted by this assay, and inadequate number of viral copies(<138 copies/mL). A negative result must be combined with clinical observations, patient history, and epidemiological information. The expected result is Negative.  Fact Sheet for Patients:  EntrepreneurPulse.com.au  Fact Sheet for Healthcare Providers:  IncredibleEmployment.be  This test is no t yet approved or cleared by the [place] FDA and  has been authorized for detection and/or diagnosis of SARS-CoV-2 by FDA under an Emergency Use Authorization (EUA). This EUA will remain  in effect (meaning this test can be used) for the duration of the COVID-19 declaration under Section 564(b)(1) of the Act, 21 U.S.C.section 360bbb-3(b)(1), unless the authorization is terminated  or revoked sooner.       Influenza A by PCR NEGATIVE NEGATIVE Final   Influenza B by PCR NEGATIVE NEGATIVE Final    Comment: (NOTE) The Xpert Xpress SARS-CoV-2/FLU/RSV plus assay is intended as an aid in the diagnosis of influenza from Nasopharyngeal swab specimens and should not be used as a sole basis for treatment. Nasal washings and aspirates are unacceptable for Xpert Xpress SARS-CoV-2/FLU/RSV testing.  Fact Sheet for  Patients: EntrepreneurPulse.com.au  Fact Sheet for Healthcare Providers: IncredibleEmployment.be  This test is not yet approved or cleared by  the [employer] and has been authorized for detection and/or diagnosis of SARS-CoV-2 by FDA under an Emergency Use Authorization (EUA). This EUA will remain in effect (meaning this test can be used) for the duration of the COVID-19 declaration under Section 564(b)(1) of the Act, 21 U.S.C. section 360bbb-3(b)(1), unless the authorization is terminated or revoked.  Performed at [facility], [address] [place], [place] [address]      Radiological Exams on Admission: CT ABDOMEN PELVIS WO CONTRAST  Result Date: 10/03/2020 CLINICAL DATA:  Left lower quadrant abdominal pain beginning today. Shortness of breath. EXAM: CT ABDOMEN AND PELVIS WITHOUT CONTRAST TECHNIQUE: Multidetector CT imaging of the abdomen and pelvis was performed following the standard protocol without IV contrast. COMPARISON:  Radiography same day.  CT 03/03/2020. FINDINGS: Lower chest: Moderate right pleural effusion layering dependently with dependent pulmonary atelectasis. Small left effusion layering dependently with mild dependent atelectasis. Chronic interstitial prominence at both lung bases. Chronic pleural thickening and calcification at the right medial base, unchanged since October. Hepatobiliary: No liver parenchymal lesion is seen. Previous cholecystectomy. Pancreas: Normal Spleen: Normal Adrenals/Urinary Tract: Adrenal glands are normal. Scattered cysts of both kidneys. 2 mm nonobstructing stone in the upper pole of the left kidney. No hydroureteronephrosis. Bladder is normal. Stomach/Bowel: Stomach appears normal. Small bowel is normal. Multiple surgical clips in the right lower quadrant region. Left: Is normal. No evidence of diverticulosis or diverticulitis. No other inflammatory change. Vascular/Lymphatic: Aortic  atherosclerosis. No aneurysm. IVC is normal. No retroperitoneal adenopathy. Reproductive: Normal Other: No free fluid or air. Musculoskeletal: Advanced lumbar degenerative changes including degenerative anterolisthesis at L4-5 with spinal stenosis most pronounced at the L4-5 level. IMPRESSION: Bilateral pleural effusions layering dependently, larger on the right than the left, with dependent pulmonary atelectasis. Chronic pleural thickening and calcification in the right lower medial pleural space, unchanged. No acute intra-abdominal pathology is seen. No evidence of bowel obstruction or inflammation. No sign of diverticulosis or diverticulitis. Surgical clips in the right lower quadrant due to previous bowel surgery. No acute complicating feature in that region is noted. Electronically Signed   By: Nelson Chimes M.D.   On: 10/03/2020 13:46   DG Chest 2 View  Result Date: 10/03/2020 CLINICAL DATA:  Onset chest pain and increased shortness of breath this morning. EXAM: CHEST - 2 VIEW COMPARISON:  PA and lateral chest 08/12/2020 and 03/03/2020. CT chest 04/27/2019. FINDINGS: The lungs are emphysematous. Small bilateral pleural effusions are seen, larger on the right. There is small focus of airspace opacity in the right lung base. Heart size is upper normal. The patient is status post CABG. No acute or focal bony abnormality. IMPRESSION: Small focus of airspace opacity in right lung base could be due to pneumonia but has an appearance most suggestive of atelectasis. Small bilateral pleural effusions, greater on the right. Negative for pulmonary edema. Aortic Atherosclerosis (ICD10-I70.0) and Emphysema (ICD10-J43.9). Electronically Signed   By: Inge Rise M.D.   On: 10/03/2020 12:09     EKG: I have personally reviewed.  Sinus rhythm, QTC 518, LAE, left bundle block it really seems to be old, right axis deviation, poor R wave progression   Assessment/Plan Principal Problem:   Acute on chronic systolic  congestive heart failure (HCC) Active Problems:   CAD (coronary artery disease)   HTN (hypertension)   Chronic atrial fibrillation (HCC)   Iron deficiency anemia due to chronic blood loss   GERD (gastroesophageal reflux disease)   BPH (benign prostatic hyperplasia)   Abdominal  pain   HLD (hyperlipidemia)   CKD (chronic kidney disease), stage IIIb   MDS (myelodysplastic syndrome), low grade (HCC)   Hypomagnesemia   Pleural effusion   Chest pain   Acute on chronic systolic congestive heart failure ([place]): 2D echo on 02/27/2020 showed EF 35-40%.  Patient has trace leg edema, but has elevated BNP 896, positive JVD, clinically consistent with CHF exacerbation.    -Place to progressive unit for observation -Lasix 40 mg bid by IV -2d echo -Daily weights -strict I/O's -Low salt diet -Fluid restriction -Obtain REDs Vest reading  Chest pain and hx of CAD (coronary artery disease): s/p of CABG and stent. Trop 17.  Likely due to demand ischemia secondary to CHF exacerbation. -Trend troponin - Lipitor, ranolazine -As needed nitroglycerin and morphine -Check A1c, FLP -Follow-up 2D echo  HTN (hypertension) -Metoprolol -Patient is on IV Lasix -IV hydralazine as needed  Chronic atrial fibrillation [facility]): Patient not taking anticoagulants currently -Continue metoprolol and digoxin  Iron deficiency anemia due to chronic blood loss: Hemoglobin stable, 10.1 -Continue iron supplement  GERD (gastroesophageal reflux disease) -Protonix and Pepcid  BPH (benign prostatic hyperplasia) -Flomax  Abdominal pain: Patient has epigastric abdominal pain, lipase normal, CT scan not impressive.  Possibly due to GERD -Patient is on Protonix and Pepcid  HLD (hyperlipidemia) -Lipitor  CKD (chronic kidney disease), stage IIIb: Stable -Follow-up with BMP  MDS (myelodysplastic syndrome), low grade ([place]): Hemoglobin stable -Follow-up with hematologist  Hypomagnesemia: Magnesium 1.5 -Repleted with  magnesium sulfate 2 g  Pleural effusion: CT scan showed layered pleural effusion, but no fever or leukocytosis, no signs of infection. -Patient is on IV Lasix, if no improvement, may consider thoracentesis     DVT ppx:  SQ Lovenox Code Status: Full code Family Communication:     Yes, patient's  daughter at bed side Disposition Plan:  Anticipate discharge back to previous environment Consults called:  none Admission status and Level of care: Progressive Cardiac:    for obs   Status is: Observation  The patient remains OBS appropriate and will d/c before 2 midnights.  Dispo: The patient is from: Home              Anticipated d/c is to: Home              Patient currently is not medically stable to d/c.   Difficult to place patient No           Date of Service 10/03/2020    [facility]   If 7PM-7AM, please contact night-coverage [email or web address] 10/03/2020, 8:07 PM

## 2020-10-03 NOTE — ED Provider Notes (Signed)
Emergency Medicine Provider Triage Evaluation Note  Andres Gutierrez , a 87 y.o. male  was evaluated in triage.  Pt complains of weakness, epigastric pain, shortness of breath and chest pain since this morning.  History of GI bleeds..  Review of Systems  Positive: As above,  Negative: No dysuria  Physical Exam  BP 114/82   Pulse 76   Temp 97.7 F (36.5 C) (Oral)   Resp 20   Ht 6\' 4"  (1.93 m)   Wt 82.1 kg   SpO2 99%   BMI 22.03 kg/m  Gen:   Awake, no distress   Resp:  Normal effort  MSK:   Moves extremities without difficulty  Other:  Patient does appear to feel weak  Medical Decision Making  Medically screening exam initiated at 12:16 PM.  Appropriate orders placed.  Kristen Cardinal was informed that the remainder of the evaluation will be completed by another provider, this initial triage assessment does not replace that evaluation, and the importance of remaining in the ED until their evaluation is complete.  Evaluated patient.  Instructed nursing staff to give him the next bed    Versie Starks, PA-C 10/03/20 1218    Delman Kitten, MD 10/03/20 1550

## 2020-10-03 NOTE — ED Provider Notes (Signed)
[facility] Emergency Department Provider Note   ____________________________________________   Event Date/Time   First MD Initiated Contact with Patient 10/03/20 1237     (approximate)  I have reviewed the triage vital signs and the nursing notes.   HISTORY  Chief Complaint Abdominal Pain and Chest Pain    HPI Andres Gutierrez is a 87 y.o. male history of CKD, CHF, coronary disease  Patient presents, earlier this morning he noticed he was short of breath and had a strange pain in both of his arms, also abdominal pain but no vomiting.  No fevers or chills  Pain in the chest and right arms has resolved.  Now having some pain mild about 3-10 in his left lower quadrant.  He did have shortness of breath, daughter visited him and noticed he seemed quite short of breath this morning more so than typical.  He does report getting short of breath at night and also frequently short of breath with walking.  Shortness of breath seems to be worsening     Past Medical History:  Diagnosis Date  . Anemia   . Anginal pain ([place])   . Anxiety   . Cardiomyopathy ([place])   . Cerebral amyloid angiopathy ([place])   . CHF (congestive heart failure) ([place])   . Chronic kidney disease    kidney stones  . Coronary artery disease   . Cough   . Dyspnea   . GERD (gastroesophageal reflux disease)   . History of kidney stones   . Hypertension   . Lymphadenopathy, hilar   . MDS (myelodysplastic syndrome) ([place])   . MI (myocardial infarction) ([place])    x 2  . Wheezing     Patient Active Problem List   Diagnosis Date Noted  . Acute on chronic systolic congestive heart failure ([place]) 10/03/2020  . Hypomagnesemia 10/03/2020  . Pleural effusion 10/03/2020  . Ischemic chest pain ([place]) 10/03/2020  . GI bleed 08/12/2020  . MDS (myelodysplastic syndrome), low grade ([place]) 07/22/2020  . Cerebral amyloid angiopathy ([place]) 03/05/2020  . Abdominal pain 03/03/2020  . HLD (hyperlipidemia)  03/03/2020  . Blurry vision 03/03/2020  . CKD (chronic kidney disease), stage IIIb 03/03/2020  . Left ureteral stone 03/03/2020  . Generalized weakness   . Cerebral hemorrhage, nontraumatic (HCC) 03/02/2020  . Chronic cholecystitis 11/06/2019  . Malnutrition of moderate degree 07/30/2019  . Acute neutrophilia 07/30/2019  . Sepsis ([place]) 07/29/2019  . Acute cholecystitis 07/29/2019  . Foot infection   . Acute on chronic heart failure ([place]) 06/07/2019  . Weakness   . NSTEMI (non-ST elevated myocardial infarction) ([place])   . Orthostatic hypotension 01/07/2019  . Symptomatic anemia 08/05/2018  . Anemia of chronic kidney failure, stage 3 (moderate) ([place]) 08/05/2018  . BPH (benign prostatic hyperplasia) 07/24/2018  . Gastroesophageal reflux disease with esophagitis 07/24/2018  . Stage 3 chronic kidney disease ([place]) 07/24/2018  . Thrombocytopenia ([place]) 07/24/2018  . Primary osteoarthritis of both knees 08/22/2017  . Medicare annual wellness visit, initial 11/27/2016  . Chronic systolic heart failure ([place]) 01/17/2016  . Chewing tobacco use 01/17/2016  . Atrial fibrillation with RVR ([place]) 12/23/2015  . Dyspnea on exertion 12/23/2015  . Elevated troponin 12/23/2015  . GERD (gastroesophageal reflux disease) 12/23/2015  . Iron deficiency anemia due to chronic blood loss 12/20/2015  . Femoral neck fracture, left, closed, initial encounter 11/03/2015  . Chronic atrial fibrillation ([place])   . Coronary artery disease involving native coronary artery of native heart without angina pectoris   .  Acalculous cholecystitis 10/29/2015  . B12 deficiency 08/03/2015  . Erosive esophagitis 08/03/2015  . Adenopathy   . CAD (coronary artery disease) 12/22/2014  . HTN (hypertension) 12/22/2014  . Cardiomyopathy, ischemic 07/27/2014    Past Surgical History:  Procedure Laterality Date  . BRONCHIAL NEEDLE ASPIRATION BIOPSY N/A 12/31/2014   Procedure: BRONCHIAL NEEDLE ASPIRATION BIOPSIES from carina;   Surgeon: Flora Lipps, MD;  Location: [facility] ORS;  Service: Cardiopulmonary;  Laterality: N/A;  . CARDIAC CATHETERIZATION N/A 12/28/2015   Procedure: Left Heart Cath and Cors/Grafts Angiography;  Surgeon: Yolonda Kida, MD;  Location: [facility];  Service: Cardiovascular;  Laterality: N/A;  . CATARACT EXTRACTION W/PHACO Left 08/01/2016   Procedure: CATARACT EXTRACTION PHACO AND INTRAOCULAR LENS PLACEMENT ([place]) Left;  Surgeon: Leandrew Koyanagi, MD;  Location: [facility];  Service: Ophthalmology;  Laterality: Left;  . CHOLECYSTECTOMY    . COLON SURGERY    . CORONARY ANGIOPLASTY WITH STENT PLACEMENT    . CORONARY ARTERY BYPASS GRAFT    . CYSTOSCOPY/URETEROSCOPY/HOLMIUM LASER/STENT PLACEMENT Left 03/18/2020   Procedure: CYSTOSCOPY/URETEROSCOPY/HOLMIUM LASER/STENT PLACEMENT;  Surgeon: Billey Co, MD;  Location: [facility] ORS;  Service: Urology;  Laterality: Left;  . ENDOBRONCHIAL ULTRASOUND N/A 12/31/2014   Procedure: ENDOBRONCHIAL ULTRASOUND;  Surgeon: Flora Lipps, MD;  Location: [facility] OR[facility];  Service: Cardiopulmonary;  Laterality: N/A;  . ESOPHAGOGASTRODUODENOSCOPY N/A 08/15/2020   Procedure: ESOPHAGOGASTRODUODENOSCOPY (EGD);  Surgeon: Toledo, Benay Pike, MD;  Location: [facility] ENDOSCOPY;  Service: Gastroenterology;  Laterality: N/A;  . ESOPHAGOGASTRODUODENOSCOPY (EGD) WITH PROPOFOL N/A 06/20/2015   Procedure: ESOPHAGOGASTRODUODENOSCOPY (EGD) WITH PROPOFOL;  Surgeon: Lollie Sails, MD;  Location: [facility] ENDOSCOPY;  Service: Endoscopy;  Laterality: N/A;  . IR CATHETER TUBE CHANGE  10/26/2019  . IR CHOLANGIOGRAM EXISTING TUBE  08/13/2019    Prior to Admission medications   Medication Sig Start Date End Date Taking? Authorizing Provider  atorvastatin (LIPITOR) 40 MG tablet Take 1 tablet (40 mg total) by mouth daily with supper. 02/27/20   Enzo Bi, MD  digoxin (LANOXIN) 0.125 MG tablet Take 0.0625 mg by mouth daily.    [provider]  famotidine (PEPCID) 20 MG tablet Take 20 mg  by mouth at bedtime. 02/04/19   [provider]  feeding supplement (ENSURE ENLIVE / ENSURE PLUS) LIQD Take 237 mLs by mouth 2 (two) times daily between meals. 03/04/20   Lavina Hamman, MD  ferrous sulfate 325 (65 FE) MG tablet Take 1 tablet (325 mg total) by mouth every Monday, Wednesday, and Friday. With supper 02/29/20   Enzo Bi, MD  furosemide (LASIX) 20 MG tablet Take 1 tablet (20 mg total) by mouth daily as needed (for weight gain of 3lbs in 1 day or 5 lbs in 2 days or shortness of breath or leg swelling). 03/04/20   Lavina Hamman, MD  metoprolol tartrate (LOPRESSOR) 25 MG tablet Take 12.5 mg by mouth at bedtime.    [provider]  Multiple Vitamin (MULTIVITAMIN) tablet Take 1 tablet by mouth daily.    [provider]  pantoprazole (PROTONIX) 40 MG tablet Take 1 tablet (40 mg total) by mouth 2 (two) times daily. 08/17/20   Lorella Nimrod, MD  polyethylene glycol (MIRALAX / GLYCOLAX) 17 g packet Take 17 g by mouth daily as needed for mild constipation. 08/17/20   Lorella Nimrod, MD  ranolazine (RANEXA) 1000 MG SR tablet Take 500 mg by mouth 2 (two) times daily.  01/16/19   [provider]  sucralfate (CARAFATE) 1 G tablet Take 1 g  by mouth 2 (two) times daily.     [provider]  tamsulosin (FLOMAX) 0.4 MG CAPS capsule Take 0.4 mg by mouth daily after supper.     [provider]  vitamin B-12 (CYANOCOBALAMIN) 500 MCG tablet Take 500 mcg by mouth daily.    [provider]    Allergies Levaquin [levofloxacin], Escitalopram, 5ht3 receptor antagonists, Diltiazem, Diphenhydramine hcl, Maxidex [dexamethasone], Prednisone, Serotonin, Vytorin [ezetimibe-simvastatin], and Zocor [simvastatin]  Family History  Problem Relation Age of Onset  . CAD Mother   . Colon cancer Father     Social History Social History   Tobacco Use  . Smoking status: Former Smoker    Quit date: 06/27/1977    Years since quitting: 43.2  . Smokeless tobacco:  Current User    Types: Chew  Vaping Use  . Vaping Use: Never used  Substance Use Topics  . Alcohol use: Yes    Comment: occational almost rare once a year  . Drug use: No    Review of Systems Constitutional: No fever/chills ENT: No sore throat. Cardiovascular: See HPI, cannot really describe the chest pain he had very well but it has resolved Respiratory: Denies shortness of breath. Gastrointestinal: No abdominal pain.   Genitourinary: Negative for dysuria. Musculoskeletal: Negative for back pain. Skin: Negative for rash. Neurological: Negative for headaches, areas of focal weakness or numbness.    ____________________________________________   PHYSICAL EXAM:  VITAL SIGNS: ED Triage Vitals  Enc Vitals Group     BP 10/03/20 1032 102/61     Pulse Rate 10/03/20 1032 74     Resp 10/03/20 1032 18     Temp 10/03/20 1032 97.7 F (36.5 C)     Temp Source 10/03/20 1032 Oral     SpO2 10/03/20 1032 95 %     Weight 10/03/20 1034 181 lb (82.1 kg)     Height 10/03/20 1034 6\' 4"  (1.93 m)     Head Circumference --      Peak Flow --      Pain Score 10/03/20 1034 3     Pain Loc --      Pain Edu? --      Excl. in [place]? --     Constitutional: Alert and oriented. Well appearing and in no acute distress. Eyes: Conjunctivae are normal. Head: Atraumatic. Nose: No congestion/rhinnorhea. Mouth/Throat: Mucous membranes are moist. Neck: No stridor.  Cardiovascular: Normal rate, regular rhythm. Grossly normal heart sounds.  Good peripheral circulation. Respiratory: Normal respiratory effort.  No retractions. Lungs CTAB. Gastrointestinal: Soft and mild tenderness notable primarily in the left lower quadrant without rebound or guarding, negative for acute abdomen type finding. No distention. Musculoskeletal: No lower extremity tenderness with mild bilateral lower extremity edema. Neurologic:  Normal speech and language. No gross focal neurologic deficits are appreciated.  Skin:  Skin is  warm, dry and intact. No rash noted. Psychiatric: Mood and affect are normal. Speech and behavior are normal.  ____________________________________________   LABS (all labs ordered are listed, but only abnormal results are displayed)  Labs Reviewed  COMPREHENSIVE METABOLIC PANEL - Abnormal; Notable for the following components:      Result Value   Sodium 133 (*)    CO2 21 (*)    Glucose, Bld 105 (*)    Creatinine, Ser 1.71 (*)    GFR, Estimated 38 (*)    All other components within normal limits  CBC - Abnormal; Notable for the following components:   RBC 3.24 (*)  Hemoglobin 10.1 (*)    HCT 30.5 (*)    RDW 18.1 (*)    All other components within normal limits  URINALYSIS, COMPLETE (UACMP) WITH MICROSCOPIC - Abnormal; Notable for the following components:   Color, Urine YELLOW (*)    APPearance CLEAR (*)    All other components within normal limits  MAGNESIUM - Abnormal; Notable for the following components:   Magnesium 1.5 (*)    All other components within normal limits  BRAIN NATRIURETIC PEPTIDE - Abnormal; Notable for the following components:   B Natriuretic Peptide 896.5 (*)    All other components within normal limits  DIGOXIN LEVEL - Abnormal; Notable for the following components:   Digoxin Level 0.6 (*)    All other components within normal limits  RESP PANEL BY RT-PCR (FLU A&B, COVID) ARPGX2  LIPASE, BLOOD  PROTIME-INR  TROPONIN I (HIGH SENSITIVITY)  TROPONIN I (HIGH SENSITIVITY)   ____________________________________________  EKG  Is reviewed interpreted at 1040 Heart rate 80 QRS 179 QTc 520 Sinus rhythm, LVH with probable left bundle branch block.  Compared with previous there is some change what appears to be slight elevation of aVR as well as change in V2 with increase in ST segment elevation, however I do not believe that this EKG is at all conclusive for STEMI.  Patient is also chest pain-free at this  time. ____________________________________________  RADIOLOGY  CT ABDOMEN PELVIS WO CONTRAST  Result Date: 10/03/2020 CLINICAL DATA:  Left lower quadrant abdominal pain beginning today. Shortness of breath. EXAM: CT ABDOMEN AND PELVIS WITHOUT CONTRAST TECHNIQUE: Multidetector CT imaging of the abdomen and pelvis was performed following the standard protocol without IV contrast. COMPARISON:  Radiography same day.  CT 03/03/2020. FINDINGS: Lower chest: Moderate right pleural effusion layering dependently with dependent pulmonary atelectasis. Small left effusion layering dependently with mild dependent atelectasis. Chronic interstitial prominence at both lung bases. Chronic pleural thickening and calcification at the right medial base, unchanged since October. Hepatobiliary: No liver parenchymal lesion is seen. Previous cholecystectomy. Pancreas: Normal Spleen: Normal Adrenals/Urinary Tract: Adrenal glands are normal. Scattered cysts of both kidneys. 2 mm nonobstructing stone in the upper pole of the left kidney. No hydroureteronephrosis. Bladder is normal. Stomach/Bowel: Stomach appears normal. Small bowel is normal. Multiple surgical clips in the right lower quadrant region. Left: Is normal. No evidence of diverticulosis or diverticulitis. No other inflammatory change. Vascular/Lymphatic: Aortic atherosclerosis. No aneurysm. IVC is normal. No retroperitoneal adenopathy. Reproductive: Normal Other: No free fluid or air. Musculoskeletal: Advanced lumbar degenerative changes including degenerative anterolisthesis at L4-5 with spinal stenosis most pronounced at the L4-5 level. IMPRESSION: Bilateral pleural effusions layering dependently, larger on the right than the left, with dependent pulmonary atelectasis. Chronic pleural thickening and calcification in the right lower medial pleural space, unchanged. No acute intra-abdominal pathology is seen. No evidence of bowel obstruction or inflammation. No sign of  diverticulosis or diverticulitis. Surgical clips in the right lower quadrant due to previous bowel surgery. No acute complicating feature in that region is noted. Electronically Signed   By: Nelson Chimes M.D.   On: 10/03/2020 13:46   DG Chest 2 View  Result Date: 10/03/2020 CLINICAL DATA:  Onset chest pain and increased shortness of breath this morning. EXAM: CHEST - 2 VIEW COMPARISON:  PA and lateral chest 08/12/2020 and 03/03/2020. CT chest 04/27/2019. FINDINGS: The lungs are emphysematous. Small bilateral pleural effusions are seen, larger on the right. There is small focus of airspace opacity in the right lung base. Heart size  is upper normal. The patient is status post CABG. No acute or focal bony abnormality. IMPRESSION: Small focus of airspace opacity in right lung base could be due to pneumonia but has an appearance most suggestive of atelectasis. Small bilateral pleural effusions, greater on the right. Negative for pulmonary edema. Aortic Atherosclerosis (ICD10-I70.0) and Emphysema (ICD10-J43.9). Electronically Signed   By: Inge Rise M.D.   On: 10/03/2020 12:09   Imaging reviewed, CT notable for bilateral pleural effusions.  Additional findings as well noted.  Negative for acute intra-abdominal pathology. ____________________________________________   PROCEDURES  Procedure(s) performed: None  Procedures  Critical Care performed: No  ____________________________________________   INITIAL IMPRESSION / ASSESSMENT AND PLAN / ED COURSE  Pertinent labs & imaging results that were available during my care of the patient were reviewed by me and considered in my medical decision making (see chart for details).   The patient presents for evaluation of shortness of breath associated with episode also of chest pain earlier today.  EKG does demonstrate some changes, but I do not feel that they are consistent with an obvious acute ACS, his first troponin is also reassuring and he is  currently pain-free.  However he certainly does have cardiac risk factors, his EKG is abnormal, but in the setting of left bundle branch block, is difficult to exclude cardiac ischemia but also difficult to included with this EKG.  In addition his chest x-ray and CT scan findings demonstrate concerning pleural effusions, negative for acute finding in the abdomen.  And after reassessment he reports his abdominal pain is essentially resolved.  Discussed with both the patient and his daughter, option to potentially consider diuresis and discharge with careful return precautions for what appears to be mild to moderate volume overload versus admission for additional cardiac observation and treatment.  After discussing with both him and his daughter, patient would like to be observed overnight, and I think is quite reasonable.  His symptoms seem to be much improved at this time and thus far I do not see evidence of obvious ACS or life-threatening emergency, but do feel volume overload and further work-up is indicated based on his chest x-ray and presentation today      ____________________________________________   FINAL CLINICAL IMPRESSION(S) / ED DIAGNOSES  Final diagnoses:  Acute on chronic congestive heart failure, unspecified heart failure type (HCC)  Chest pain, moderate coronary artery risk        Note:  This document was prepared using Dragon voice recognition software and may include unintentional dictation errors       Delman Kitten, MD 10/03/20 1543

## 2020-10-04 ENCOUNTER — Observation Stay
Admit: 2020-10-04 | Discharge: 2020-10-04 | Disposition: A | Discharge status: Home / Self Care | Payer: PPO | Attending: Internal Medicine | Admitting: Internal Medicine

## 2020-10-04 ENCOUNTER — Inpatient Hospital Stay: Payer: PPO

## 2020-10-04 DIAGNOSIS — I5022 Chronic systolic (congestive) heart failure: Secondary | ICD-10-CM | POA: Diagnosis not present

## 2020-10-04 DIAGNOSIS — I5023 Acute on chronic systolic (congestive) heart failure: Secondary | ICD-10-CM | POA: Diagnosis not present

## 2020-10-04 LAB — BASIC METABOLIC PANEL
Anion gap: 11 (ref 5–15)
BUN: 21 mg/dL (ref 8–23)
CO2: 21 mmol/L — ABNORMAL LOW (ref 22–32)
Calcium: 8.8 mg/dL — ABNORMAL LOW (ref 8.9–10.3)
Chloride: 101 mmol/L (ref 98–111)
Creatinine, Ser: 1.78 mg/dL — ABNORMAL HIGH (ref 0.61–1.24)
GFR, Estimated: 36 mL/min — ABNORMAL LOW (ref >60)
Glucose, Bld: 97 mg/dL (ref 70–99)
Potassium: 4.7 mmol/L (ref 3.5–5.1)
Sodium: 133 mmol/L — ABNORMAL LOW (ref 135–145)

## 2020-10-04 LAB — ECHOCARDIOGRAM COMPLETE
AR max vel: 1.12 cm2
AV Area VTI: 1.31 cm2
AV Area mean vel: 1.21 cm2
AV Mean grad: 9.3 mmHg
AV Peak grad: 18.4 mmHg
Ao pk vel: 2.15 m/s
Area-P 1/2: 4.39 cm2
Calc EF: 7.2 %
Height: 76 in
S' Lateral: 4.68 cm
Single Plane A2C EF: -4.9 %
Single Plane A4C EF: 20.1 %
Weight: 2871.27 oz

## 2020-10-04 LAB — HEMOGLOBIN A1C
Hgb A1c MFr Bld: 5.4 % (ref 4.8–5.6)
Mean Plasma Glucose: 108 mg/dL

## 2020-10-04 LAB — LIPID PANEL
Cholesterol: 74 mg/dL (ref 0–200)
HDL: 19 mg/dL — ABNORMAL LOW (ref >40)
LDL Cholesterol: 41 mg/dL (ref 0–99)
Total CHOL/HDL Ratio: 3.9 RATIO
Triglycerides: 72 mg/dL (ref <150)
VLDL: 14 mg/dL (ref 0–40)

## 2020-10-04 LAB — MAGNESIUM: Magnesium: 1.8 mg/dL (ref 1.7–2.4)

## 2020-10-04 MED ORDER — FUROSEMIDE 20 MG PO TABS: 20.0000 mg | ORAL_TABLET | Freq: Every day | ORAL | 0 refills | Status: DC

## 2020-10-04 NOTE — Progress Notes (Signed)
*  PRELIMINARY RESULTS* Echocardiogram 2D Echocardiogram has been performed.  [name] 10/04/2020, 10:09 AM

## 2020-10-04 NOTE — Evaluation (Signed)
Physical Therapy Evaluation Patient Details Name: Andres Gutierrez MRN: [id number] DOB: 03-20-1931 Today's Date: 10/04/2020   History of Present Illness  Pt admitted for acute on chronic CHF with complaints of abdominal pain and SOB symptoms. History includes HTN, HLD, GERD, anxiety, CAD, CAGB, and previous stent placement. No complinats at this time and pt eager to go home this afternoon.  Clinical Impression  Pt is a pleasant 87 year old male who was admitted for acute/chronic CHF. Pt demonstrates all transfers/ambulation at baseline level. Eager to ambulate and able to ambulate around RN station. Safe technique without complaints. Pt does not require any further PT needs at this time. Pt will be dc in house and does not require follow up. RN aware. Will dc current orders.    Follow Up Recommendations No PT follow up    Equipment Recommendations  None recommended by PT    Recommendations for Other Services       Precautions / Restrictions Precautions Precautions: None Restrictions Weight Bearing Restrictions: No      Mobility  Bed Mobility               General bed mobility comments: not performed as received in recliner    Transfers Overall transfer level: Modified independent Equipment used: Rolling walker (2 wheeled)             General transfer comment: safe technique with ease of transfer noted  Ambulation/Gait Ambulation/Gait assistance: Modified independent (Device/Increase time) Gait Distance (Feet): 200 Feet Assistive device: Rolling walker (2 wheeled) Gait Pattern/deviations: WFL(Within Functional Limits)     General Gait Details: slightly forward flexed posture, however RW was not correctly adjusted. Reciprocal gait pattern with slight IR of L foot. Pt aware and demonstrated no formal LOB.  Stairs            Wheelchair Mobility    Modified Rankin (Stroke Patients Only)       Balance Overall balance assessment: Independent                                            Pertinent Vitals/Pain Pain Assessment: No/denies pain    Home Living Family/patient expects to be discharged to:: Private residence Living Arrangements: Alone Available Help at Discharge: Family;Available PRN/intermittently Type of Home: House Home Access: Stairs to enter Entrance Stairs-Rails: Right Entrance Stairs-Number of Steps: 2 Home Layout: One level Home Equipment: Walker - 2 wheels;Cane - single point;Bedside commode;Shower seat;Grab bars - tub/shower      Prior Function Level of Independence: Independent with assistive device(s)         Comments: Pt denies falls history and reports he uses RW at all times. Has daughter near by if needed     Hand Dominance        Extremity/Trunk Assessment   Upper Extremity Assessment Upper Extremity Assessment: Overall WFL for tasks assessed    Lower Extremity Assessment Lower Extremity Assessment: Overall WFL for tasks assessed       Communication   Communication: HOH  Cognition Arousal/Alertness: Awake/alert Behavior During Therapy: WFL for tasks assessed/performed Overall Cognitive Status: Within Functional Limits for tasks assessed                                        General Comments  Exercises     Assessment/Plan    PT Assessment Patent does not need any further PT services  PT Problem List         PT Treatment Interventions      PT Goals (Current goals can be found in the Care Plan section)  Acute Rehab PT Goals Patient Stated Goal: to go home this afternoon PT Goal Formulation: All assessment and education complete, DC therapy Time For Goal Achievement: 10/04/20 Potential to Achieve Goals: Good    Frequency     Barriers to discharge        Co-evaluation               AM-PAC PT "6 Clicks" Mobility  Outcome Measure Help needed turning from your back to your side while in a flat bed without using bedrails?:  None Help needed moving from lying on your back to sitting on the side of a flat bed without using bedrails?: None Help needed moving to and from a bed to a chair (including a wheelchair)?: None Help needed standing up from a chair using your arms (e.g., wheelchair or bedside chair)?: None Help needed to walk in hospital room?: A Little Help needed climbing 3-5 steps with a railing? : A Little 6 Click Score: 22    End of Session   Activity Tolerance: Patient tolerated treatment well Patient left: in chair Nurse Communication: Mobility status PT Visit Diagnosis: Muscle weakness (generalized) (M62.81)    Time: 1258-1310 PT Time Calculation (min) (ACUTE ONLY): 12 min   Charges:   PT Evaluation $PT Eval Low Complexity: [place], PT, DPT [phone]   Deon Duer 10/04/2020, 2:05 PM

## 2020-10-04 NOTE — Progress Notes (Signed)
Discharge instructions completed at time of discharge with patient's daughter at bedside. Questions regarding Palliative were asked and I reached out to [facility], MD Reesa Chew and NP Laurann Montana for answers via secure chat. NP had me place order for OP palliative and HH. I contacted Shelton Silvas to send me info if she had it and I will reach out to daughter before end of shift if I have info. Vitals WDL and patient stable for discharge at this time. I have enjoyed caring for this patient!

## 2020-10-04 NOTE — Discharge Summary (Signed)
Physician Discharge Summary  [name] YOV:[id number] DOB: 01/09/1931 DOA: 10/03/2020  PCP: Rusty Aus, MD  Admit date: 10/03/2020 Discharge date: 10/04/2020  Admitted From: Home Disposition: Home  Recommendations for Outpatient Follow-up:  1. Follow up with PCP in 1-2 weeks 2. Follow-up with cardiology within next 1 to 2 days 3. Please obtain BMP/CBC in one week 4. Please follow up on the following pending results: None  Home Health: Yes Equipment/Devices: Rolling walker Discharge Condition: Stable CODE STATUS: Full Diet recommendation: Heart Healthy   Brief/Interim Summary: Andres Gutierrez is a 87 y.o. male with medical history significant of hypertension, hyperlipidemia, GERD, anxiety, CAD, CABG, stent placement, MDS, kidney stone, sCHF with EF 35-40, cerebral amyloid angiopathy, ICH 02/27/2020, anemia, chewing tobacco, CKD-IIb, atrial fibrillation not on anticoagulants, BPH, GI bleeding, who presents with chest pain, shortness of breath, abdominal pain.  Chest pain was atypical, with barely positive troponin with a flat curve most likely secondary to demand ischemia.  Echocardiogram with little worsening of EF to 20 to 25% with global hypokinesis. Elevated BNP and chest x-ray with basilar atelectasis and small bilateral pleural effusions, clinically seems stable, saturating well on room air.  Normal work of breathing.  He received IV diuresis while in the hospital with good urinary output.  Patient takes 20 mg of Lasix only as needed for swelling and has not taken it for many days.  He was advised to start taking Lasix on a daily basis and follow-up closely with his cardiologist for further recommendations.  His digoxin levels were subtherapeutic and his cardiologist should be able to monitor and adjust the doses. Patient will need a repeat chest x-ray in few weeks to monitor his pleural effusion most likely secondary to HFrEF.  CT abdomen was negative for any intra-abdominal abnormal  findings.  Patient is not on any anticoagulation for his chronic atrial fibrillation due to GI bleed.  Patient is high risk for increased mortality secondary to multiple comorbidities, advanced age. Discussed with daughter who lives close by and keeping a close eye on him.  He will continue rest of his home medications and follow-up with his providers.   Discharge Diagnoses:  Principal Problem:   Acute on chronic systolic congestive heart failure (HCC) Active Problems:   CAD (coronary artery disease)   HTN (hypertension)   Chronic atrial fibrillation (HCC)   Iron deficiency anemia due to chronic blood loss   GERD (gastroesophageal reflux disease)   BPH (benign prostatic hyperplasia)   Abdominal pain   HLD (hyperlipidemia)   CKD (chronic kidney disease), stage IIIb   MDS (myelodysplastic syndrome), low grade (HCC)   Hypomagnesemia   Pleural effusion   Chest pain    Discharge Instructions  Discharge Instructions    (HEART FAILURE PATIENTS) Call MD:  Anytime you have any of the following symptoms: 1) 3 pound weight gain in 24 hours or 5 pounds in 1 week 2) shortness of breath, with or without a dry hacking cough 3) swelling in the hands, feet or stomach 4) if you have to sleep on extra pillows at night in order to breathe.   Complete by: As directed    AMB referral to CHF clinic   Complete by: As directed    Diet - low sodium heart healthy   Complete by: As directed    Discharge instructions   Complete by: As directed    It was pleasure taking care of you. Please take 20 mg of Lasix daily and see your cardiologist  within next day or so for further adjustment of your medications. Keep yourself well-hydrated.   Increase activity slowly   Complete by: As directed      Allergies as of 10/04/2020      Reactions   Levaquin [levofloxacin]    Andres Gutierrez, her daughter, said Levaquin should be avoided because when patient had Levaquin a few years ago, he had "a bad reaction".  She  said he could not walk and could not lift up his feet.   Escitalopram Other (See Comments)   Reaction:  Makes him feel faint, like he was going to have a heart attack.   5ht3 Receptor Antagonists    Diltiazem Rash   Diphenhydramine Hcl Other (See Comments)   Reaction:  Rash and fever a long time ago, but has had it since with no problem.   Maxidex [dexamethasone] Other (See Comments)   Reaction:  Unknown    Prednisone Other (See Comments)   Pt states that this medication made him feel crazy.     Serotonin Other (See Comments)   Tried 2 different types, lexapro and another one.  Reaction:  Made him feel like he was having a heart attack.    Vytorin [ezetimibe-simvastatin] Other (See Comments)   Reaction:  Unknown    Zocor [simvastatin] Other (See Comments)   Reaction:  Unknown       Medication List    TAKE these medications   atorvastatin 40 MG tablet Commonly known as: LIPITOR Take 1 tablet (40 mg total) by mouth daily with supper.   digoxin 0.125 MG tablet Commonly known as: LANOXIN Take 0.0625 mg by mouth daily.   famotidine 20 MG tablet Commonly known as: PEPCID Take 20 mg by mouth at bedtime.   feeding supplement Liqd Take 237 mLs by mouth 2 (two) times daily between meals.   ferrous sulfate 325 (65 FE) MG tablet Take 1 tablet (325 mg total) by mouth every Monday, Wednesday, and Friday. With supper   furosemide 20 MG tablet Commonly known as: LASIX Take 1 tablet (20 mg total) by mouth daily. What changed:   when to take this  reasons to take this   metoprolol tartrate 25 MG tablet Commonly known as: LOPRESSOR Take 12.5 mg by mouth at bedtime.   multivitamin tablet Take 1 tablet by mouth daily.   pantoprazole 40 MG tablet Commonly known as: PROTONIX Take 1 tablet (40 mg total) by mouth 2 (two) times daily.   polyethylene glycol 17 g packet Commonly known as: MIRALAX / GLYCOLAX Take 17 g by mouth daily as needed for mild constipation.   ranolazine  1000 MG SR tablet Commonly known as: RANEXA Take 500 mg by mouth 2 (two) times daily.   sucralfate 1 g tablet Commonly known as: CARAFATE Take 1 g by mouth 2 (two) times daily.   tamsulosin 0.4 MG Caps capsule Commonly known as: FLOMAX Take 0.4 mg by mouth daily after supper.   vitamin B-12 500 MCG tablet Commonly known as: CYANOCOBALAMIN Take 500 mcg by mouth daily.       Follow-up Information    Rusty Aus, MD. Schedule an appointment as soon as possible for a visit.   Specialty: Internal Medicine Contact information: [address] [place] [address] [phone]        Teodoro Spray, MD. Schedule an appointment as soon as possible for a visit in 1 day(s).   Specialty: Cardiology Contact information: [place]  [phone]              Allergies  Allergen Reactions  . Levaquin [Levofloxacin]     Andres Gutierrez, her daughter, said Levaquin should be avoided because when patient had Levaquin a few years ago, he had "a bad reaction".  She said he could not walk and could not lift up his feet.  . Escitalopram Other (See Comments)    Reaction:  Makes him feel faint, like he was going to have a heart attack.  . 5ht3 Receptor Antagonists   . Diltiazem Rash  . Diphenhydramine Hcl Other (See Comments)    Reaction:  Rash and fever a long time ago, but has had it since with no problem.  . Maxidex [Dexamethasone] Other (See Comments)    Reaction:  Unknown   . Prednisone Other (See Comments)    Pt states that this medication made him feel crazy.    . Serotonin Other (See Comments)    Tried 2 different types, lexapro and another one.  Reaction:  Made him feel like he was having a heart attack.   . Vytorin [Ezetimibe-Simvastatin] Other (See Comments)    Reaction:  Unknown   . Zocor [Simvastatin] Other (See Comments)    Reaction:  Unknown     Consultations:  None, chat discussion with Dr.  Nehemiah Massed  Procedures/Studies: CT ABDOMEN PELVIS WO CONTRAST  Result Date: 10/03/2020 CLINICAL DATA:  Left lower quadrant abdominal pain beginning today. Shortness of breath. EXAM: CT ABDOMEN AND PELVIS WITHOUT CONTRAST TECHNIQUE: Multidetector CT imaging of the abdomen and pelvis was performed following the standard protocol without IV contrast. COMPARISON:  Radiography same day.  CT 03/03/2020. FINDINGS: Lower chest: Moderate right pleural effusion layering dependently with dependent pulmonary atelectasis. Small left effusion layering dependently with mild dependent atelectasis. Chronic interstitial prominence at both lung bases. Chronic pleural thickening and calcification at the right medial base, unchanged since October. Hepatobiliary: No liver parenchymal lesion is seen. Previous cholecystectomy. Pancreas: Normal Spleen: Normal Adrenals/Urinary Tract: Adrenal glands are normal. Scattered cysts of both kidneys. 2 mm nonobstructing stone in the upper pole of the left kidney. No hydroureteronephrosis. Bladder is normal. Stomach/Bowel: Stomach appears normal. Small bowel is normal. Multiple surgical clips in the right lower quadrant region. Left: Is normal. No evidence of diverticulosis or diverticulitis. No other inflammatory change. Vascular/Lymphatic: Aortic atherosclerosis. No aneurysm. IVC is normal. No retroperitoneal adenopathy. Reproductive: Normal Other: No free fluid or air. Musculoskeletal: Advanced lumbar degenerative changes including degenerative anterolisthesis at L4-5 with spinal stenosis most pronounced at the L4-5 level. IMPRESSION: Bilateral pleural effusions layering dependently, larger on the right than the left, with dependent pulmonary atelectasis. Chronic pleural thickening and calcification in the right lower medial pleural space, unchanged. No acute intra-abdominal pathology is seen. No evidence of bowel obstruction or inflammation. No sign of diverticulosis or diverticulitis.  Surgical clips in the right lower quadrant due to previous bowel surgery. No acute complicating feature in that region is noted. Electronically Signed   By: Nelson Chimes M.D.   On: 10/03/2020 13:46   DG Chest 2 View  Result Date: 10/03/2020 CLINICAL DATA:  Onset chest pain and increased shortness of breath this morning. EXAM: CHEST - 2 VIEW COMPARISON:  PA and lateral chest 08/12/2020 and 03/03/2020. CT chest 04/27/2019. FINDINGS: The lungs are emphysematous. Small bilateral pleural effusions are seen, larger on the right. There is small focus of airspace opacity in the right lung base. Heart size is upper normal. The patient is status post CABG.  No acute or focal bony abnormality. IMPRESSION: Small focus of airspace opacity in right lung base could be due to pneumonia but has an appearance most suggestive of atelectasis. Small bilateral pleural effusions, greater on the right. Negative for pulmonary edema. Aortic Atherosclerosis (ICD10-I70.0) and Emphysema (ICD10-J43.9). Electronically Signed   By: Inge Rise M.D.   On: 10/03/2020 12:09   ECHOCARDIOGRAM COMPLETE  Result Date: 10/04/2020    ECHOCARDIOGRAM REPORT   Patient Name:   Andres Gutierrez Date of Exam: 10/04/2020 Medical Rec #:  [id number]    Height:       76.0 in Accession #:    [id number]   Weight:       179.5 lb Date of Birth:  [birth year]    BSA:          2.115 m Patient Age:    87 years     BP:           120/73 mmHg Patient Gender: M            HR:           64 bpm. Exam Location:  [facility] Procedure: 2D Echo, Cardiac Doppler and Color Doppler Indications:     CHF-acute systolic T01.60  History:         Patient has prior history of Echocardiogram examinations, most                  recent 02/27/2020. Cardiomyopathy, Previous Myocardial                  Infarction, Prior CABG; Risk Factors:Hypertension.  Sonographer:     [name] RDCS (AE) Referring Phys:  [id number] Soledad Gerlach NIU Diagnosing Phys: Serafina Royals MD  Sonographer Comments: Suboptimal apical  window. IMPRESSIONS  1. Left ventricular ejection fraction, by estimation, is 25 to 30%. The left ventricle has severely decreased function. The left ventricle demonstrates global hypokinesis. The left ventricular internal cavity size was moderately dilated. There is moderate left ventricular hypertrophy. Left ventricular diastolic parameters are consistent with Grade II diastolic dysfunction (pseudonormalization).  2. Right ventricular systolic function is normal. The right ventricular size is normal.  3. The mitral valve is normal in structure. Moderate mitral valve regurgitation.  4. The tricuspid valve is abnormal. Tricuspid valve regurgitation is moderate.  5. The aortic valve is normal in structure. Aortic valve regurgitation is mild. FINDINGS  Left Ventricle: Left ventricular ejection fraction, by estimation, is 25 to 30%. The left ventricle has severely decreased function. The left ventricle demonstrates global hypokinesis. The left ventricular internal cavity size was moderately dilated. There is moderate left ventricular hypertrophy. Left ventricular diastolic parameters are consistent with Grade II diastolic dysfunction (pseudonormalization). Right Ventricle: The right ventricular size is normal. No increase in right ventricular wall thickness. Right ventricular systolic function is normal. Left Atrium: Left atrial size was normal in size. Right Atrium: Right atrial size was normal in size. Pericardium: There is no evidence of pericardial effusion. Mitral Valve: The mitral valve is normal in structure. Moderate mitral valve regurgitation. Tricuspid Valve: The tricuspid valve is abnormal. Tricuspid valve regurgitation is moderate. Aortic Valve: The aortic valve is normal in structure. Aortic valve regurgitation is mild. Aortic valve mean gradient measures 9.3 mmHg. Aortic valve peak gradient measures 18.4 mmHg. Aortic valve area, by VTI measures 1.31 cm. Pulmonic Valve: The pulmonic valve was normal in  structure. Pulmonic valve regurgitation is mild. Aorta: The aortic root and ascending aorta are structurally normal, with no evidence of dilitation.  IAS/Shunts: No atrial level shunt detected by color flow Doppler.  LEFT VENTRICLE PLAX 2D LVIDd:         5.17 cm      Diastology LVIDs:         4.68 cm      LV e' medial:    4.24 cm/s LV PW:         2.08 cm      LV E/e' medial:  33.5 LV IVS:        1.09 cm      LV e' lateral:   6.42 cm/s LVOT diam:     2.00 cm      LV E/e' lateral: 22.1 LV SV:         49 LV SV Index:   23 LVOT Area:     3.14 cm  LV Volumes (MOD) LV vol d, MOD A2C: 142.0 ml LV vol d, MOD A4C: 169.0 ml LV vol s, MOD A2C: 149.0 ml LV vol s, MOD A4C: 135.0 ml LV SV MOD A2C:     -7.0 ml LV SV MOD A4C:     169.0 ml LV SV MOD BP:      12.0 ml RIGHT VENTRICLE RV Basal diam:  2.55 cm RV S prime:     10.80 cm/s TAPSE (M-mode): 4.2 cm LEFT ATRIUM              Index LA diam:        5.60 cm  2.65 cm/m LA Vol (A2C):   127.0 ml 60.03 ml/m LA Vol (A4C):   73.1 ml  34.55 ml/m LA Biplane Vol: 97.1 ml  45.90 ml/m  AORTIC VALVE                    PULMONIC VALVE AV Area (Vmax):    1.12 cm     PV Vmax:        0.92 m/s AV Area (Vmean):   1.21 cm     PV Peak grad:   3.4 mmHg AV Area (VTI):     1.31 cm     RVOT Peak grad: 5 mmHg AV Vmax:           214.67 cm/s AV Vmean:          139.000 cm/s AV VTI:            0.371 m AV Peak Grad:      18.4 mmHg AV Mean Grad:      9.3 mmHg LVOT Vmax:         76.40 cm/s LVOT Vmean:        53.400 cm/s LVOT VTI:          0.155 m LVOT/AV VTI ratio: 0.42  AORTA Ao Root diam: 3.90 cm MITRAL VALVE                TRICUSPID VALVE MV Area (PHT): 4.39 cm     TR Peak grad:   29.8 mmHg MV Decel Time: 173 msec     TR Vmax:        273.00 cm/s MV E velocity: 142.00 cm/s                             SHUNTS                             Systemic VTI:  0.16 m  Systemic Diam: 2.00 cm Serafina Royals MD Electronically signed by Serafina Royals MD Signature Date/Time:  10/04/2020/12:29:24 PM    Final      Subjective: Patient was seen and examined today.  No complaints.  He thinks he is at his baseline.  Patient lives alone, daughter lives nearby.  He sees Dr. Ubaldo Glassing every 93-month, advised to see him within next couple of days for further management.  We also discussed about taking Lasix 20 mg daily every day till he sees his cardiologist for further recommendations.  Discharge Exam: Vitals:   10/04/20 0415 10/04/20 0730  BP: 115/62 120/73  Pulse: 76 64  Resp: 18 16  Temp: 97.8 F (36.6 C) 97.9 F (36.6 C)  SpO2: 95% 96%   Vitals:   10/03/20 1815 10/03/20 2000 10/04/20 0415 10/04/20 0730  BP: 108/60 (!) 126/59 115/62 120/73  Pulse: 85 83 76 64  Resp: 17 20 18 16   Temp:  97.9 F (36.6 C) 97.8 F (36.6 C) 97.9 F (36.6 C)  TempSrc:  Oral Oral   SpO2: 98% 95% 95% 96%  Weight:   81.4 kg   Height:   6\' 4"  (1.93 m)     General: Pt is alert, awake, not in acute distress Cardiovascular: RRR, S1/S2 +, no rubs, no gallops Respiratory: CTA bilaterally, no wheezing, no rhonchi Abdominal: Soft, NT, ND, bowel sounds + Extremities: no edema, no cyanosis   The results of significant diagnostics from this hospitalization (including imaging, microbiology, ancillary and laboratory) are listed below for reference.    Microbiology: Recent Results (from the past 240 hour(s))  Resp Panel by RT-PCR (Flu A&B, Covid) Nasopharyngeal Swab     Status: None   Collection Time: 10/03/20  3:46 PM   Specimen: Nasopharyngeal Swab; Nasopharyngeal(NP) swabs in vial transport medium  Result Value Ref Range Status   SARS Coronavirus 2 by RT PCR NEGATIVE NEGATIVE Final    Comment: (NOTE) SARS-CoV-2 target nucleic acids are NOT DETECTED.  The SARS-CoV-2 RNA is generally detectable in upper respiratory specimens during the acute phase of infection. The lowest concentration of SARS-CoV-2 viral copies this assay can detect is 138 copies/mL. A negative result does not  preclude SARS-Cov-2 infection and should not be used as the sole basis for treatment or other patient management decisions. A negative result may occur with  improper specimen collection/handling, submission of specimen other than nasopharyngeal swab, presence of viral mutation(s) within the areas targeted by this assay, and inadequate number of viral copies(<138 copies/mL). A negative result must be combined with clinical observations, patient history, and epidemiological information. The expected result is Negative.  Fact Sheet for Patients:  EntrepreneurPulse.com.au  Fact Sheet for Healthcare Providers:  IncredibleEmployment.be  This test is no t yet approved or cleared by the [place] FDA and  has been authorized for detection and/or diagnosis of SARS-CoV-2 by FDA under an Emergency Use Authorization (EUA). This EUA will remain  in effect (meaning this test can be used) for the duration of the COVID-19 declaration under Section 564(b)(1) of the Act, 21 U.S.C.section 360bbb-3(b)(1), unless the authorization is terminated  or revoked sooner.       Influenza A by PCR NEGATIVE NEGATIVE Final   Influenza B by PCR NEGATIVE NEGATIVE Final    Comment: (NOTE) The Xpert Xpress SARS-CoV-2/FLU/RSV plus assay is intended as an aid in the diagnosis of influenza from Nasopharyngeal swab specimens and should not be used as a sole basis for treatment. Nasal washings and aspirates are unacceptable for Xpert  Xpress SARS-CoV-2/FLU/RSV testing.  Fact Sheet for Patients: EntrepreneurPulse.com.au  Fact Sheet for Healthcare Providers: IncredibleEmployment.be  This test is not yet approved or cleared by the [place] FDA and has been authorized for detection and/or diagnosis of SARS-CoV-2 by FDA under an Emergency Use Authorization (EUA). This EUA will remain in effect (meaning this test can be used) for the  duration of the COVID-19 declaration under Section 564(b)(1) of the Act, 21 U.S.C. section 360bbb-3(b)(1), unless the authorization is terminated or revoked.  Performed at [facility], [address] [place], [place] [address]      Labs: BNP (last 3 results) Recent Labs    03/03/20 1121 10/03/20 1052  BNP 293.7* 892.1*   Basic Metabolic Panel: Recent Labs  Lab 10/03/20 1052 10/03/20 1053 10/04/20 0541  NA  --  133* 133*  K  --  3.9 4.7  CL  --  101 101  CO2  --  21* 21*  GLUCOSE  --  105* 97  BUN  --  20 21  CREATININE  --  1.71* 1.78*  CALCIUM  --  9.3 8.8*  MG 1.5*  --  1.8   Liver Function Tests: Recent Labs  Lab 10/03/20 1053  AST 22  ALT 13  ALKPHOS 69  BILITOT 1.1  PROT 7.9  ALBUMIN 4.5   Recent Labs  Lab 10/03/20 1053  LIPASE 41   No results for input(s): AMMONIA in the last 168 hours. CBC: Recent Labs  Lab 10/03/20 1053  WBC 8.5  HGB 10.1*  HCT 30.5*  MCV 94.1  PLT 179   Cardiac Enzymes: No results for input(s): CKTOTAL, CKMB, CKMBINDEX, TROPONINI in the last 168 hours. BNP: Invalid input(s): POCBNP CBG: No results for input(s): GLUCAP in the last 168 hours. D-Dimer No results for input(s): DDIMER in the last 72 hours. Hgb A1c No results for input(s): HGBA1C in the last 72 hours. Lipid Profile Recent Labs    10/04/20 0541  CHOL 74  HDL 19*  LDLCALC 41  TRIG 72  CHOLHDL 3.9   Thyroid function studies No results for input(s): TSH, T4TOTAL, T3FREE, THYROIDAB in the last 72 hours.  Invalid input(s): FREET3 Anemia work up No results for input(s): VITAMINB12, FOLATE, FERRITIN, TIBC, IRON, RETICCTPCT in the last 72 hours. Urinalysis    Component Value Date/Time   COLORURINE YELLOW (A) 10/03/2020 1306   APPEARANCEUR CLEAR (A) 10/03/2020 1306   LABSPEC 1.014 10/03/2020 1306   PHURINE 5.0 10/03/2020 1306   GLUCOSEU NEGATIVE 10/03/2020 1306   HGBUR NEGATIVE 10/03/2020 1306   BILIRUBINUR NEGATIVE 10/03/2020 1306    KETONESUR NEGATIVE 10/03/2020 1306   PROTEINUR NEGATIVE 10/03/2020 1306   UROBILINOGEN 0.2 09/11/2010 0647   NITRITE NEGATIVE 10/03/2020 1306   LEUKOCYTESUR NEGATIVE 10/03/2020 1306   Sepsis Labs Invalid input(s): PROCALCITONIN,  WBC,  LACTICIDVEN Microbiology Recent Results (from the past 240 hour(s))  Resp Panel by RT-PCR (Flu A&B, Covid) Nasopharyngeal Swab     Status: None   Collection Time: 10/03/20  3:46 PM   Specimen: Nasopharyngeal Swab; Nasopharyngeal(NP) swabs in vial transport medium  Result Value Ref Range Status   SARS Coronavirus 2 by RT PCR NEGATIVE NEGATIVE Final    Comment: (NOTE) SARS-CoV-2 target nucleic acids are NOT DETECTED.  The SARS-CoV-2 RNA is generally detectable in upper respiratory specimens during the acute phase of infection. The lowest concentration of SARS-CoV-2 viral copies this assay can detect is 138 copies/mL. A negative result does not preclude SARS-Cov-2 infection and should not be  used as the sole basis for treatment or other patient management decisions. A negative result may occur with  improper specimen collection/handling, submission of specimen other than nasopharyngeal swab, presence of viral mutation(s) within the areas targeted by this assay, and inadequate number of viral copies(<138 copies/mL). A negative result must be combined with clinical observations, patient history, and epidemiological information. The expected result is Negative.  Fact Sheet for Patients:  EntrepreneurPulse.com.au  Fact Sheet for Healthcare Providers:  IncredibleEmployment.be  This test is no t yet approved or cleared by the [place] FDA and  has been authorized for detection and/or diagnosis of SARS-CoV-2 by FDA under an Emergency Use Authorization (EUA). This EUA will remain  in effect (meaning this test can be used) for the duration of the COVID-19 declaration under Section 564(b)(1) of the Act,  21 U.S.C.section 360bbb-3(b)(1), unless the authorization is terminated  or revoked sooner.       Influenza A by PCR NEGATIVE NEGATIVE Final   Influenza B by PCR NEGATIVE NEGATIVE Final    Comment: (NOTE) The Xpert Xpress SARS-CoV-2/FLU/RSV plus assay is intended as an aid in the diagnosis of influenza from Nasopharyngeal swab specimens and should not be used as a sole basis for treatment. Nasal washings and aspirates are unacceptable for Xpert Xpress SARS-CoV-2/FLU/RSV testing.  Fact Sheet for Patients: EntrepreneurPulse.com.au  Fact Sheet for Healthcare Providers: IncredibleEmployment.be  This test is not yet approved or cleared by the [place] FDA and has been authorized for detection and/or diagnosis of SARS-CoV-2 by FDA under an Emergency Use Authorization (EUA). This EUA will remain in effect (meaning this test can be used) for the duration of the COVID-19 declaration under Section 564(b)(1) of the Act, 21 U.S.C. section 360bbb-3(b)(1), unless the authorization is terminated or revoked.  Performed at [facility], [address] [place], [place] [address]     Time coordinating discharge: Over 30 minutes  SIGNED:  Lorella Nimrod, MD  [facility] 10/04/2020, 1:29 PM  If 7PM-7AM, please contact night-coverage [email or web address]  This record has been created using Systems analyst. Errors have been sought and corrected,but may not always be located. Such creation errors do not reflect on the standard of care.

## 2020-10-04 NOTE — Consult Note (Addendum)
   Heart Failure Nurse Navigator Note  HFrEF of  35-40%.  Echocardiogram performed on this admission is pending.  Comorbidities:  Coronary artery disease/coronary artery bypass grafting Hypertension Hyperlipidemia Anxiety   Labs:  Sodium 133, potassium 4.7, chloride 101, CO2 21, BUN 21, creatinine 1.78 Intake is not documented Output 1008 100 mL Weight is 81.4 kg down from 82.1 of yesterday.  Medications:  Digoxin 0.0625 mg daily Furosemide 40 mg IV every 12 Metoprolol tartrate 12-1/2 mg at bedtime Ranolazine 500 mg 2 times a day Lipitor 40 mg daily  Assessment:  General-she is awake and alert lying in bed in no acute distress  HEENT- no JVD, poor dentition.  Cardiac-heart tones of regular rate and rhythm.  Chest-lungs are clear to posterior auscultation.  Abdomen-flat soft nontender.  Musculoskeletal-there is no lower extremity edema noted.  Psych-is pleasant and appropriate, makes good eye contact  Neurologic- speech is clear, moves all extremities without difficulty.   Initial meeting with patient today.  He states that he lives alone and daughter that lives within 2 blocks supplies his meals.  He does admit to 3 times a week eating a sausage and cheese biscuit for breakfast.  They also go out to eat once a week for the evening meal.  He does not use salt at the table.  Discussed making good choices when eating out.  Showed examples of restaurant eating that are in the living with heart failure teaching booklet.  Discussed following up in the heart failure clinic.  Patient requested that I call and speak with his daughter Vinnie Level, which I did.  She had some very good questions, discussed other options other than eating the sausage  and cheese biscuit 3 times a week she  voices understanding.  Also made her aware that the patient will start to take Lasix 20 mg on a daily basis until he sees Dr. Ubaldo Glassing.  She was also concerned about him being sent home.  She states  in the emergency room she was told that his lungs sounded clear, and she wondered about a repeat chest x-ray being done before discharge.  I told her that I would let the attending physician know.  Daughter also questioned if he should go back on the aspirin, instructed to follow-up with that with Dr. Ubaldo Glassing on the post hospital appointment.  She requests that the appointment with Dr Ubaldo Glassing be made with the clinic in [facility] rather than [place].  He was given the living with heart failure booklet, along with low-sodium handout and taking control of your heart failure handouts.  Also given the zone magnet.  Pricilla Riffle RN CHFN

## 2020-10-05 DIAGNOSIS — I5022 Chronic systolic (congestive) heart failure: Secondary | ICD-10-CM | POA: Diagnosis not present

## 2020-10-05 DIAGNOSIS — K5521 Angiodysplasia of colon with hemorrhage: Secondary | ICD-10-CM | POA: Diagnosis not present

## 2020-10-05 DIAGNOSIS — I482 Chronic atrial fibrillation, unspecified: Secondary | ICD-10-CM | POA: Diagnosis not present

## 2020-10-06 DIAGNOSIS — D469 Myelodysplastic syndrome, unspecified: Secondary | ICD-10-CM | POA: Diagnosis not present

## 2020-10-06 DIAGNOSIS — D500 Iron deficiency anemia secondary to blood loss (chronic): Secondary | ICD-10-CM | POA: Diagnosis not present

## 2020-10-06 DIAGNOSIS — I482 Chronic atrial fibrillation, unspecified: Secondary | ICD-10-CM | POA: Diagnosis not present

## 2020-10-06 DIAGNOSIS — I5022 Chronic systolic (congestive) heart failure: Secondary | ICD-10-CM | POA: Diagnosis not present

## 2020-10-07 ENCOUNTER — Telehealth: Payer: Self-pay | Admitting: Primary Care

## 2020-10-07 NOTE — Telephone Encounter (Signed)
Spoke with patient's daughter, [name], regarding the Palliative referral/services and all questions were answered and she was in agreement with starting services.  I have scheduled an In-home Consult for 10/11/20 @ 9 AM.

## 2020-10-11 ENCOUNTER — Other Ambulatory Visit: Payer: Self-pay

## 2020-10-11 ENCOUNTER — Inpatient Hospital Stay: Payer: PPO

## 2020-10-11 ENCOUNTER — Other Ambulatory Visit: Payer: Self-pay | Admitting: Primary Care

## 2020-10-11 VITALS — BP 109/55 | HR 72

## 2020-10-11 DIAGNOSIS — Z515 Encounter for palliative care: Secondary | ICD-10-CM

## 2020-10-11 DIAGNOSIS — I129 Hypertensive chronic kidney disease with stage 1 through stage 4 chronic kidney disease, or unspecified chronic kidney disease: Secondary | ICD-10-CM | POA: Diagnosis not present

## 2020-10-11 DIAGNOSIS — D4620 Refractory anemia with excess of blasts, unspecified: Secondary | ICD-10-CM

## 2020-10-11 DIAGNOSIS — N183 Chronic kidney disease, stage 3 unspecified: Secondary | ICD-10-CM

## 2020-10-11 DIAGNOSIS — I5022 Chronic systolic (congestive) heart failure: Secondary | ICD-10-CM

## 2020-10-11 DIAGNOSIS — R531 Weakness: Secondary | ICD-10-CM

## 2020-10-11 DIAGNOSIS — D631 Anemia in chronic kidney disease: Secondary | ICD-10-CM

## 2020-10-11 DIAGNOSIS — D500 Iron deficiency anemia secondary to blood loss (chronic): Secondary | ICD-10-CM

## 2020-10-11 DIAGNOSIS — D46Z Other myelodysplastic syndromes: Secondary | ICD-10-CM

## 2020-10-11 LAB — CBC WITH DIFFERENTIAL/PLATELET
Abs Immature Granulocytes: 0.06 10*3/uL (ref 0.00–0.07)
Basophils Absolute: 0 10*3/uL (ref 0.0–0.1)
Basophils Relative: 0 %
Eosinophils Absolute: 0.1 10*3/uL (ref 0.0–0.5)
Eosinophils Relative: 1 %
HCT: 29.2 % — ABNORMAL LOW (ref 39.0–52.0)
Hemoglobin: 9.3 g/dL — ABNORMAL LOW (ref 13.0–17.0)
Immature Granulocytes: 1 %
Lymphocytes Relative: 30 %
Lymphs Abs: 2.3 10*3/uL (ref 0.7–4.0)
MCH: 30.8 pg (ref 26.0–34.0)
MCHC: 31.8 g/dL (ref 30.0–36.0)
MCV: 96.7 fL (ref 80.0–100.0)
Monocytes Absolute: 2.4 10*3/uL — ABNORMAL HIGH (ref 0.1–1.0)
Monocytes Relative: 32 %
Neutro Abs: 2.7 10*3/uL (ref 1.7–7.7)
Neutrophils Relative %: 36 %
Platelets: 164 10*3/uL (ref 150–400)
RBC: 3.02 MIL/uL — ABNORMAL LOW (ref 4.22–5.81)
RDW: 18.3 % — ABNORMAL HIGH (ref 11.5–15.5)
Smear Review: ADEQUATE
WBC: 7.5 10*3/uL (ref 4.0–10.5)
nRBC: 0 % (ref 0.0–0.2)

## 2020-10-11 MED ORDER — DARBEPOETIN ALFA 300 MCG/0.6ML IJ SOSY
300.0000 ug | PREFILLED_SYRINGE | Freq: Once | INTRAMUSCULAR | Status: AC
  Administered 2020-10-11: 300 ug via SUBCUTANEOUS
  Filled 2020-10-11: qty 0.6

## 2020-10-11 NOTE — Progress Notes (Signed)
[facility] Consult Note Telephone: [phone]  Fax: [phone]    Date of encounter: 10/11/20 PATIENT NAME: Andres Gutierrez [id number]   [phone] (home)  DOB: 1930-11-10 MRN: [id number] PRIMARY CARE PROVIDER:    Rusty Aus, MD,  [place] [address] [phone]  REFERRING PROVIDER:   Rusty Aus, MD [address],  [place] [address] [phone]  RESPONSIBLE PARTY:    Contact Information    Name Relation Home Work [place] Daughter   [phone]   [place], [place] [phone]  [phone]       I met face to face with patient and family in  home. Palliative Care was asked to follow this patient by consultation request of  Rusty Aus, MD to address advance care planning and complex medical decision making. This is the initial visit.                                     ASSESSMENT AND PLAN / RECOMMENDATIONS:   Advance Care Planning/Goals of Care: Goals include to maximize quality of life and symptom management. Our advance care planning conversation included a discussion about:     The value and importance of advance care planning   Exploration of personal, cultural or spiritual beliefs that might influence medical decisions   Exploration of goals of care in the event of a sudden injury or illness   Identification of a healthcare agent   Review  of an  advance directive document .  CODE STATUS: FULL   I met with patient and his daughter in his home. Their goals  for palliative care include hoping to keep him out of the hospital. He's had multiple hospitalizations almost monthly since January. His issues include anemia and shortness of breath related to congestive heart failure. He had an endoscopic procedure which has seemed to resolve a recent G.I. bleed but is  monitored every two weeks for  ongoing  drop in hemoglobin. Today is 9.1.   We discuss advanced care plans. He states he has done a will and Durable power of attorney. He states his daughter helps him make  healthcare decisions and checks in  frequently, but is not sure about healthcare power of attorney formally.  We discussed the MOST  form today, including what sort of decisions a power of attorney would help with. I've made a follow up appointment to discuss after family is able to review the form.  Symptom Management/Plan:  Caregiver: States lives alone, daughter is in and out. Can make sandwiches,  She helps with shopping. Sleeps 11-6 am, Sleeps well but nocturia x 2.  Has alarm system for falling.  Mobility: Able to ambulate in home with walker. Endorses increasing DOE with less distance able to cover until resting. Can rise from sitting (I).  Nutrition; Daughter endorses 20 lb wt loss in last 9 months. Weighs daily for CHF monitoring. Pt dislikes supplements. Needs monitoring.   Follow up Palliative Care Visit: Palliative care will continue to follow for complex medical decision making, advance care planning, and clarification of goals. Return 4-6 weeks or prn.  I spent 45 minutes providing this consultation. More than 50% of the time in this consultation was spent in counseling and care coordination.  PPS: 50%  HOSPICE ELIGIBILITY/DIAGNOSIS: TBD  Chief Complaint: weakness  HISTORY OF PRESENT ILLNESS:  Andres Gutierrez is a 87 y.o. year old male  with h/o CHF, wt loss, gi bleed, MDS. Endorses increased weakness due to anemia and deconditioning. Is followed by oncology and transfused as needed for hgb <10. Endorses wanting to stay out of hospital for events such as CHF exacerbation and gi bleeds.   History obtained from review of EMR, discussion with primary team, and interview with family, facility staff/caregiver and/or Andres Gutierrez.  I reviewed available labs, medications, imaging,  studies and related documents from the EMR.  Records reviewed and summarized above.   ROS   General: NAD ENMT: denies dysphagia Cardiovascular: denies chest pain, endorses increased  DOE Pulmonary: denies cough, endorses  increased SOB Abdomen: endorses good appetite, denies constipation, endorses continence of bowel GU: denies dysuria, endorses continence of urine, nocturia x 2. MSK: endorses  weakness,  no falls reported Skin: denies rashes or wounds Neurological: denies pain, denies insomnia Psych: Endorses positive mood Heme/lymph/immuno: denies bruises, abnormal bleeding  Physical Exam: Current and past weights: 179-183 lbs, weighs daily for CHF. May have been around 200 lbs at baseline. Constitutional: NAD General: frail appearing, thin EYES: anicteric sclera, lids intact, no discharge  ENMT: hard of  hearing, oral mucous membranes moist, dentition intact CV: S1S2, RRR, no LE edema Pulmonary: LCTA, no increased work of breathing, no cough, room air Abdomen: intake 75%, no ascites GU: deferred MSK: moderate sarcopenia, moves all extremities, ambulatory with walker Skin: warm and dry, no rashes or wounds on visible skin Neuro:  + generalized weakness,  Psych: non-anxious affect, A and O x 3 Hem/lymph/immuno: no widespread bruising   CURRENT PROBLEM LIST:  Patient Active Problem List   Diagnosis Date Noted  . Acute on chronic systolic congestive heart failure ([place]) 10/03/2020  . Hypomagnesemia 10/03/2020  . Pleural effusion 10/03/2020  . Chest pain 10/03/2020  . GI bleed 08/12/2020  . MDS (myelodysplastic syndrome), low grade ([place]) 07/22/2020  . Cerebral amyloid angiopathy ([place]) 03/05/2020  . Abdominal pain 03/03/2020  . HLD (hyperlipidemia) 03/03/2020  . Blurry vision 03/03/2020  . CKD (chronic kidney disease), stage IIIb 03/03/2020  . Left ureteral stone 03/03/2020  . Generalized weakness   . Cerebral hemorrhage, nontraumatic (HCC) 03/02/2020  . Chronic  cholecystitis 11/06/2019  . Malnutrition of moderate degree 07/30/2019  . Acute neutrophilia 07/30/2019  . Sepsis ([place]) 07/29/2019  . Acute cholecystitis 07/29/2019  . Foot infection   . Acute on chronic heart failure ([place]) 06/07/2019  . Weakness   . NSTEMI (non-ST elevated myocardial infarction) ([place])   . Orthostatic hypotension 01/07/2019  . Symptomatic anemia 08/05/2018  . Anemia of chronic kidney failure, stage 3 (moderate) ([place]) 08/05/2018  . BPH (benign prostatic hyperplasia) 07/24/2018  . Gastroesophageal reflux disease with esophagitis 07/24/2018  . Stage 3 chronic kidney disease ([place]) 07/24/2018  . Thrombocytopenia ([place]) 07/24/2018  . Primary osteoarthritis of both knees 08/22/2017  . Medicare annual wellness visit, initial 11/27/2016  . Chronic systolic heart failure ([place]) 01/17/2016  . Chewing tobacco use 01/17/2016  . Atrial fibrillation with RVR ([place]) 12/23/2015  . Dyspnea on exertion 12/23/2015  . Elevated troponin 12/23/2015  . GERD (gastroesophageal reflux disease) 12/23/2015  . Iron deficiency anemia due to chronic blood loss 12/20/2015  . Femoral neck fracture, left, closed, initial encounter 11/03/2015  . Chronic atrial fibrillation ([place])   . Coronary artery disease involving native coronary artery of native heart without angina pectoris   .  Acalculous cholecystitis 10/29/2015  . B12 deficiency 08/03/2015  . Erosive esophagitis 08/03/2015  . Adenopathy   . CAD (coronary artery disease) 12/22/2014  . HTN (hypertension) 12/22/2014  . Cardiomyopathy, ischemic 07/27/2014   PAST MEDICAL HISTORY:  Active Ambulatory Problems    Diagnosis Date Noted  . CAD (coronary artery disease) 12/22/2014  . HTN (hypertension) 12/22/2014  . Adenopathy   . Acalculous cholecystitis 10/29/2015  . Chronic atrial fibrillation ([place])   . Coronary artery disease involving native coronary artery of native heart without angina pectoris   . Femoral neck fracture, left, closed, initial  encounter 11/03/2015  . Iron deficiency anemia due to chronic blood loss 12/20/2015  . Atrial fibrillation with RVR ([place]) 12/23/2015  . Dyspnea on exertion 12/23/2015  . Elevated troponin 12/23/2015  . GERD (gastroesophageal reflux disease) 12/23/2015  . Chronic systolic heart failure ([place]) 01/17/2016  . Chewing tobacco use 01/17/2016  . Symptomatic anemia 08/05/2018  . Anemia of chronic kidney failure, stage 3 (moderate) ([place]) 08/05/2018  . Orthostatic hypotension 01/07/2019  . BPH (benign prostatic hyperplasia) 07/24/2018  . Gastroesophageal reflux disease with esophagitis 07/24/2018  . Primary osteoarthritis of both knees 08/22/2017  . Stage 3 chronic kidney disease ([place]) 07/24/2018  . Thrombocytopenia ([place]) 07/24/2018  . Acute on chronic heart failure ([place]) 06/07/2019  . Weakness   . NSTEMI (non-ST elevated myocardial infarction) ([place])   . Foot infection   . Sepsis ([place]) 07/29/2019  . Acute cholecystitis 07/29/2019  . Malnutrition of moderate degree 07/30/2019  . Acute neutrophilia 07/30/2019  . Chronic cholecystitis 11/06/2019  . B12 deficiency 08/03/2015  . Cardiomyopathy, ischemic 07/27/2014  . Erosive esophagitis 08/03/2015  . Medicare annual wellness visit, initial 11/27/2016  . Abdominal pain 03/03/2020  . HLD (hyperlipidemia) 03/03/2020  . Blurry vision 03/03/2020  . CKD (chronic kidney disease), stage IIIb 03/03/2020  . Generalized weakness   . Left ureteral stone 03/03/2020  . Cerebral amyloid angiopathy ([place]) 03/05/2020  . Cerebral hemorrhage, nontraumatic (HCC) 03/02/2020  . MDS (myelodysplastic syndrome), low grade ([place]) 07/22/2020  . GI bleed 08/12/2020  . Acute on chronic systolic congestive heart failure ([place]) 10/03/2020  . Hypomagnesemia 10/03/2020  . Pleural effusion 10/03/2020  . Chest pain 10/03/2020   Resolved Ambulatory Problems    Diagnosis Date Noted  . Chest pain 12/22/2014  . New onset atrial flutter ([place]) 12/22/2014  . Abnormal CT scan,  chest 12/22/2014  . Atypical pneumonia   . GI bleed 01/24/2016  . Acute blood loss anemia 01/24/2016   Past Medical History:  Diagnosis Date  . Anemia   . Anginal pain ([place])   . Anxiety   . Cardiomyopathy ([place])   . CHF (congestive heart failure) ([place])   . Chronic kidney disease   . Coronary artery disease   . Cough   . Dyspnea   . History of kidney stones   . Hypertension   . Lymphadenopathy, hilar   . MDS (myelodysplastic syndrome) ([place])   . MI (myocardial infarction) ([place])   . Wheezing    SOCIAL HX:  Social History   Tobacco Use  . Smoking status: Former Smoker    Quit date: 06/27/1977    Years since quitting: 43.3  . Smokeless tobacco: Current User    Types: Chew  Substance Use Topics  . Alcohol use: Yes    Comment: occational almost rare once a year   FAMILY HX:  Family History  Problem Relation Age of Onset  . CAD Mother   . Colon cancer Father  ALLERGIES:  Allergies  Allergen Reactions  . Levaquin [Levofloxacin]     [name], her daughter, said Levaquin should be avoided because when patient had Levaquin a few years ago, he had "a bad reaction".  She said he could not walk and could not lift up his feet.  . Escitalopram Other (See Comments)    Reaction:  Makes him feel faint, like he was going to have a heart attack.  . 5ht3 Receptor Antagonists   . Diltiazem Rash  . Diphenhydramine Hcl Other (See Comments)    Reaction:  Rash and fever a long time ago, but has had it since with no problem.  . Maxidex [Dexamethasone] Other (See Comments)    Reaction:  Unknown   . Prednisone Other (See Comments)    Pt states that this medication made him feel crazy.    . Serotonin Other (See Comments)    Tried 2 different types, lexapro and another one.  Reaction:  Made him feel like he was having a heart attack.   . Vytorin [Ezetimibe-Simvastatin] Other (See Comments)    Reaction:  Unknown   . Zocor [Simvastatin] Other (See Comments)    Reaction:  Unknown       PERTINENT MEDICATIONS:  Outpatient Encounter Medications as of 10/11/2020  Medication Sig  . atorvastatin (LIPITOR) 40 MG tablet Take 1 tablet (40 mg total) by mouth daily with supper.  . digoxin (LANOXIN) 0.125 MG tablet Take 0.0625 mg by mouth daily.  . famotidine (PEPCID) 20 MG tablet Take 20 mg by mouth at bedtime.  . ferrous sulfate 325 (65 FE) MG tablet Take 1 tablet (325 mg total) by mouth every Monday, Wednesday, and Friday. With supper  . furosemide (LASIX) 20 MG tablet Take 1 tablet (20 mg total) by mouth daily. (Patient taking differently: Take 20 mg by mouth daily as needed. 3 lb wt gain overnight.)  . metoprolol tartrate (LOPRESSOR) 25 MG tablet Take 12.5 mg by mouth 2 (two) times daily.  . Multiple Vitamin (MULTIVITAMIN) tablet Take 1 tablet by mouth daily.  . pantoprazole (PROTONIX) 40 MG tablet Take 1 tablet (40 mg total) by mouth 2 (two) times daily.  . polyethylene glycol (MIRALAX / GLYCOLAX) 17 g packet Take 17 g by mouth daily as needed for mild constipation.  . ranolazine (RANEXA) 1000 MG SR tablet Take 500 mg by mouth 2 (two) times daily.   . sucralfate (CARAFATE) 1 G tablet Take 1 g by mouth 2 (two) times daily.   . tamsulosin (FLOMAX) 0.4 MG CAPS capsule Take 0.4 mg by mouth daily after supper.   . vitamin B-12 (CYANOCOBALAMIN) 500 MCG tablet Take 500 mcg by mouth daily.  . feeding supplement (ENSURE ENLIVE / ENSURE PLUS) LIQD Take 237 mLs by mouth 2 (two) times daily between meals. (Patient not taking: Reported on 10/11/2020)   No facility-administered encounter medications on file as of 10/11/2020.    Thank you for the opportunity to participate in the care of Mr. Hudnall.  The palliative care team will continue to follow. Please call our office at [phone] if we can be of additional assistance.   Jason Coop, NP , DNP, MPH, AGPCNP-BC, ACHPN  COVID-19 PATIENT SCREENING TOOL Asked and negative response unless otherwise noted:   Have you had symptoms of  covid, tested positive or been in contact with someone with symptoms/positive test in the past 5-10 days?

## 2020-10-13 ENCOUNTER — Telehealth: Payer: Self-pay | Admitting: Internal Medicine

## 2020-10-13 NOTE — Telephone Encounter (Signed)
Pt daughter would like to know does he need to come next week for his lab, MD and injection appt. She stated he came yesterday and it shouldn't be this soon. Please return call to [name] at  [phone]. Thank you

## 2020-10-17 ENCOUNTER — Ambulatory Visit: Payer: PPO

## 2020-10-17 ENCOUNTER — Other Ambulatory Visit: Payer: PPO

## 2020-10-17 ENCOUNTER — Ambulatory Visit: Payer: PPO | Admitting: Internal Medicine

## 2020-10-17 DIAGNOSIS — E782 Mixed hyperlipidemia: Secondary | ICD-10-CM | POA: Diagnosis not present

## 2020-10-17 DIAGNOSIS — I951 Orthostatic hypotension: Secondary | ICD-10-CM | POA: Diagnosis not present

## 2020-10-17 DIAGNOSIS — L89892 Pressure ulcer of other site, stage 2: Secondary | ICD-10-CM | POA: Diagnosis not present

## 2020-10-17 DIAGNOSIS — I25810 Atherosclerosis of coronary artery bypass graft(s) without angina pectoris: Secondary | ICD-10-CM | POA: Diagnosis not present

## 2020-10-17 DIAGNOSIS — L97511 Non-pressure chronic ulcer of other part of right foot limited to breakdown of skin: Secondary | ICD-10-CM | POA: Diagnosis not present

## 2020-10-17 DIAGNOSIS — I5022 Chronic systolic (congestive) heart failure: Secondary | ICD-10-CM | POA: Diagnosis not present

## 2020-10-17 DIAGNOSIS — G609 Hereditary and idiopathic neuropathy, unspecified: Secondary | ICD-10-CM | POA: Diagnosis not present

## 2020-10-17 DIAGNOSIS — K5521 Angiodysplasia of colon with hemorrhage: Secondary | ICD-10-CM | POA: Diagnosis not present

## 2020-10-17 DIAGNOSIS — I10 Essential (primary) hypertension: Secondary | ICD-10-CM | POA: Diagnosis not present

## 2020-10-17 DIAGNOSIS — L97521 Non-pressure chronic ulcer of other part of left foot limited to breakdown of skin: Secondary | ICD-10-CM | POA: Diagnosis not present

## 2020-10-17 DIAGNOSIS — I420 Dilated cardiomyopathy: Secondary | ICD-10-CM | POA: Diagnosis not present

## 2020-10-17 DIAGNOSIS — I482 Chronic atrial fibrillation, unspecified: Secondary | ICD-10-CM | POA: Diagnosis not present

## 2020-10-18 ENCOUNTER — Inpatient Hospital Stay: Payer: PPO | Admitting: Internal Medicine

## 2020-10-18 ENCOUNTER — Inpatient Hospital Stay: Payer: PPO

## 2020-10-18 ENCOUNTER — Telehealth: Payer: Self-pay | Admitting: Internal Medicine

## 2020-10-18 NOTE — Assessment & Plan Note (Deleted)
#  Chronic anemia-MDS/CKD-III [s/p BMBx-low-grade myelomonocytic; FEB 2022]; deletion 20.   I reviewed the bone marrow biopsy/pathology with the patient and son in detail.  Continue aranesp every 2 weeks for now.  Await foundation 1 heme-ordered today..  #Today hemoglobin is 10.3; HOLD aranesp.  Continue p.o. iron  # Mild intermittent thrombocytopenia-question ITP [nadir November 2020-80s].  Today platelets 250s ; ok to with asprin.  Stable  #Recent acute CHF-2025% ejection fraction [[facility]]-see below ;on  Lasix 20 mg a day [half pill once a day]; STABLE.   #CKD stage III-GFR-39; stable  #Recent upper GI bleed-likely secondary to angio dysplastic lesion in the small bowel status post cautery.  Continue p.o. iron.  #Left upper lobe cystic lesion-[incidentally] CT scan- AUG 25th- 2021- CT scan-left upper lobe cystic lesion-4.5 cm-/slightly bigger by few mm-stable  #Disposition: #  HOLDAranesp today # in 2 weeks- cbc possible aranesp # in 4 weeks- cbc possible aranesp #  in 6 weeks- cbc possible aranesp # in 8 weeks MD-cbc/bmp; possible aranesp; Dr.B   

## 2020-10-18 NOTE — Telephone Encounter (Signed)
Attempt made to call patient's daughter in regards to messages left to cancel and then reschedule patient's appt today. Left VM for daughter to call back and clarify.

## 2020-10-18 NOTE — Progress Notes (Deleted)
[place] NOTE  Patient Care Team: Rusty Aus, MD as PCP - General (Internal Medicine) Alisa Graff, FNP as Nurse Practitioner (Family Medicine) Ubaldo Glassing Javier Docker, MD as Consulting Physician (Cardiology) Erby Pian, MD as Referring Physician (Specialist) Cammie Sickle, MD as Consulting Physician (Hematology and Oncology) Jason Coop, NP as Nurse Practitioner (Hospice and Palliative Medicine)  CHIEF COMPLAINTS/PURPOSE OF CONSULTATION:    HISTORY OF PRESENTING ILLNESS:  Andres Gutierrez 87 y.o.  male history of above history of chronic anemia and intermittent thrombocytopenia is here for follow-up/ currently on IV iron and also Aranesp here for follow-up.  In the interim patient was admitted to the hospital for melena; EGD showed angiodysplastic lesion bleeding status post cautery.  Patient's hemoglobin that was around 9.  Did not need any blood transfusion.  Patient's appetite is good with no weight loss nausea vomiting.  No swelling in the legs.  Chronic mild shortness of breath.  Not any worse   Review of Systems  Constitutional: Positive for malaise/fatigue. Negative for chills, diaphoresis, fever and weight loss.  HENT: Negative for nosebleeds and sore throat.   Eyes: Negative for double vision.  Respiratory: Positive for shortness of breath. Negative for cough, hemoptysis, sputum production and wheezing.   Cardiovascular: Negative for chest pain, palpitations, orthopnea and leg swelling.  Gastrointestinal: Negative for abdominal pain, blood in stool, constipation, diarrhea, heartburn, melena, nausea and vomiting.  Genitourinary: Negative for dysuria, frequency and urgency.  Musculoskeletal: Positive for back pain and joint pain.  Skin: Negative.  Negative for itching and rash.  Neurological: Negative for dizziness, tingling, focal weakness, weakness and headaches.  Endo/Heme/Allergies: Does not bruise/bleed easily.   Psychiatric/Behavioral: Negative for depression. The patient is not nervous/anxious and does not have insomnia.      MEDICAL HISTORY:  Past Medical History:  Diagnosis Date  . Anemia   . Anginal pain ([place])   . Anxiety   . Cardiomyopathy ([place])   . Cerebral amyloid angiopathy ([place])   . CHF (congestive heart failure) ([place])   . Chronic kidney disease    kidney stones  . Coronary artery disease   . Cough   . Dyspnea   . GERD (gastroesophageal reflux disease)   . History of kidney stones   . Hypertension   . Lymphadenopathy, hilar   . MDS (myelodysplastic syndrome) ([place])   . MI (myocardial infarction) ([place])    x 2  . Wheezing     SURGICAL HISTORY: Past Surgical History:  Procedure Laterality Date  . BRONCHIAL NEEDLE ASPIRATION BIOPSY N/A 12/31/2014   Procedure: BRONCHIAL NEEDLE ASPIRATION BIOPSIES from carina;  Surgeon: Flora Lipps, MD;  Location: [facility];  Service: Cardiopulmonary;  Laterality: N/A;  . CARDIAC CATHETERIZATION N/A 12/28/2015   Procedure: Left Heart Cath and Cors/Grafts Angiography;  Surgeon: Yolonda Kida, MD;  Location: [facility];  Service: Cardiovascular;  Laterality: N/A;  . CATARACT EXTRACTION W/PHACO Left 08/01/2016   Procedure: CATARACT EXTRACTION PHACO AND INTRAOCULAR LENS PLACEMENT ([place]) Left;  Surgeon: Leandrew Koyanagi, MD;  Location: [facility];  Service: Ophthalmology;  Laterality: Left;  . CHOLECYSTECTOMY    . COLON SURGERY    . CORONARY ANGIOPLASTY WITH STENT PLACEMENT    . CORONARY ARTERY BYPASS GRAFT    . CYSTOSCOPY/URETEROSCOPY/HOLMIUM LASER/STENT PLACEMENT Left 03/18/2020   Procedure: CYSTOSCOPY/URETEROSCOPY/HOLMIUM LASER/STENT PLACEMENT;  Surgeon: Billey Co, MD;  Location: [facility];  Service: Urology;  Laterality: Left;  . ENDOBRONCHIAL ULTRASOUND N/A 12/31/2014  Procedure: ENDOBRONCHIAL ULTRASOUND;  Surgeon: Flora Lipps, MD;  Location: [facility];  Service: Cardiopulmonary;  Laterality: N/A;  .  ESOPHAGOGASTRODUODENOSCOPY N/A 08/15/2020   Procedure: ESOPHAGOGASTRODUODENOSCOPY (EGD);  Surgeon: Toledo, Benay Pike, MD;  Location: [facility];  Service: Gastroenterology;  Laterality: N/A;  . ESOPHAGOGASTRODUODENOSCOPY (EGD) WITH PROPOFOL N/A 06/20/2015   Procedure: ESOPHAGOGASTRODUODENOSCOPY (EGD) WITH PROPOFOL;  Surgeon: Lollie Sails, MD;  [facility]: [facility];  Service: Endoscopy;  Laterality: N/A;  . IR CATHETER TUBE CHANGE  10/26/2019  . IR CHOLANGIOGRAM EXISTING TUBE  08/13/2019    SOCIAL HISTORY: lives in [place]; alone. 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  Social History   Socioeconomic History  . Marital status: Widowed    Spouse name: Not on file  . Number of children: 2  . Years of education: college  . Highest education level: Some college, no degree  Occupational History  . Occupation: retired  Tobacco Use  . Smoking status: Former Smoker    Quit date: 06/27/1977    Years since quitting: 43.3  . Smokeless tobacco: Current User    Types: Chew  Vaping Use  . Vaping Use: Never used  Substance and Sexual Activity  . Alcohol use: Yes    Comment: occational almost rare once a year  . Drug use: No  . Sexual activity: Not Currently    Birth control/protection: Abstinence  Other Topics Concern  . Not on file  Social History Narrative  . Not on file   Social Determinants of Health   Financial Resource Strain: Not on file  Food Insecurity: Not on file  Transportation Needs: Not on file  Physical Activity: Not on file  Stress: Not on file  Social Connections: Not on file  Intimate Partner Violence: Not on file    FAMILY HISTORY: Family History  Problem Relation Age of Onset  . CAD Mother   . Colon cancer Father     ALLERGIES:  is allergic to levaquin [levofloxacin], escitalopram, 5ht3 receptor antagonists, diltiazem, diphenhydramine hcl, maxidex [dexamethasone], prednisone, serotonin, vytorin [ezetimibe-simvastatin], and zocor [simvastatin].  MEDICATIONS:   Current Outpatient Medications  Medication Sig Dispense Refill  . atorvastatin (LIPITOR) 40 MG tablet Take 1 tablet (40 mg total) by mouth daily with supper.    . digoxin (LANOXIN) 0.125 MG tablet Take 0.0625 mg by mouth daily.    . famotidine (PEPCID) 20 MG tablet Take 20 mg by mouth at bedtime.    . feeding supplement (ENSURE ENLIVE / ENSURE PLUS) LIQD Take 237 mLs by mouth 2 (two) times daily between meals. (Patient not taking: Reported on 10/11/2020) 10000 mL 0  . ferrous sulfate 325 (65 FE) MG tablet Take 1 tablet (325 mg total) by mouth every Monday, Wednesday, and Friday. With supper    . furosemide (LASIX) 20 MG tablet Take 1 tablet (20 mg total) by mouth daily. (Patient taking differently: Take 20 mg by mouth daily as needed. 3 lb wt gain overnight.) 30 tablet 0  . metoprolol tartrate (LOPRESSOR) 25 MG tablet Take 12.5 mg by mouth 2 (two) times daily.    . Multiple Vitamin (MULTIVITAMIN) tablet Take 1 tablet by mouth daily.    . pantoprazole (PROTONIX) 40 MG tablet Take 1 tablet (40 mg total) by mouth 2 (two) times daily. 60 tablet 1  . polyethylene glycol (MIRALAX / GLYCOLAX) 17 g packet Take 17 g by mouth daily as needed for mild constipation. 14 each 0  . ranolazine (RANEXA) 1000 MG SR tablet Take 500 mg by mouth 2 (two) times  daily.     . sucralfate (CARAFATE) 1 G tablet Take 1 g by mouth 2 (two) times daily.     . tamsulosin (FLOMAX) 0.4 MG CAPS capsule Take 0.4 mg by mouth daily after supper.     . vitamin B-12 (CYANOCOBALAMIN) 500 MCG tablet Take 500 mcg by mouth daily.     No current facility-administered medications for this visit.     PHYSICAL EXAMINATION: ECOG PERFORMANCE STATUS: 0 - Asymptomatic  There were no vitals filed for this visit. There were no vitals filed for this visit.  Physical Exam Constitutional:      Comments: Elderly Caucasian male patient. He accompanied by his son.  Patient ambulating independently.   HENT:     Head: Normocephalic and atraumatic.      Mouth/Throat:     Pharynx: No oropharyngeal exudate.  Eyes:     Pupils: Pupils are equal, round, and reactive to light.  Cardiovascular:     Rate and Rhythm: Normal rate and regular rhythm.  Pulmonary:     Effort: No respiratory distress.     Breath sounds: Normal breath sounds. No wheezing.  Abdominal:     General: Bowel sounds are normal. There is no distension.     Palpations: Abdomen is soft. There is no mass.     Tenderness: There is no abdominal tenderness. There is no guarding or rebound.  Musculoskeletal:        General: No tenderness. Normal range of motion.     Cervical back: Normal range of motion and neck supple.  Skin:    General: Skin is warm.  Neurological:     Mental Status: He is alert and oriented to person, place, and time.  Psychiatric:        Mood and Affect: Affect normal.    .   LABORATORY DATA:  I have reviewed the data as listed Lab Results  Component Value Date   WBC 7.5 10/11/2020   HGB 9.3 (L) 10/11/2020   HCT 29.2 (L) 10/11/2020   MCV 96.7 10/11/2020   PLT 164 10/11/2020   Recent Labs    10/21/19 1231 11/06/19 1429 11/11/19 1104 01/07/20 0916 02/18/20 1036 08/12/20 1553 08/13/20 0532 08/15/20 0552 09/20/20 1049 10/03/20 1053 10/04/20 0541  NA  --   --  139 137   < > 134* 133*   < > 136 133* 133*  K  --   --  4.6 3.8   < > 4.1 4.2   < > 4.1 3.9 4.7  CL  --   --  104 104   < > 107 107   < > 103 101 101  CO2  --   --  26 23   < > 18* 19*   < > 22 21* 21*  GLUCOSE  --   --  112* 119*   < > 106* 86   < > 105* 105* 97  BUN  --   --  21 23   < > 24* 22   < > 21 20 21   CREATININE  --  1.90* 1.51* 1.58*   < > 1.70* 1.53*   < > 1.76* 1.71* 1.78*  CALCIUM  --   --  9.0 8.7*   < > 8.6* 8.3*   < > 8.8* 9.3 8.8*  GFRNONAA  --  31* 41* 39*   < > 39* 44*   < > 37* 38* 36*  GFRAA  --  36* 48* 45*  --   --   --   --   --   --   --  PROT 7.7  --   --   --    < > 7.0 6.2*  --   --  7.9  --   ALBUMIN 4.4  --   --   --    < > 4.1 3.6  --   --   4.5  --   AST 23  --   --   --    < > 22 19  --   --  22  --   ALT 28  --   --   --    < > 14 10  --   --  13  --   ALKPHOS 56  --   --   --    < > 64 52  --   --  69  --   BILITOT 1.4*  --   --   --    < > 0.7 0.9  --   --  1.1  --   BILIDIR 0.2  --   --   --   --   --   --   --   --   --   --   IBILI 1.2*  --   --   --   --   --   --   --   --   --   --    < > = values in this interval not displayed.   ASSESSMENT & PLAN:   No problem-specific Assessment & Plan notes found for this encounter.      Cammie Sickle, MD 10/18/2020 7:48 AM

## 2020-10-20 ENCOUNTER — Ambulatory Visit: Payer: PPO | Admitting: Family

## 2020-10-24 ENCOUNTER — Other Ambulatory Visit: Payer: Self-pay

## 2020-10-24 DIAGNOSIS — D631 Anemia in chronic kidney disease: Secondary | ICD-10-CM

## 2020-10-24 DIAGNOSIS — D500 Iron deficiency anemia secondary to blood loss (chronic): Secondary | ICD-10-CM

## 2020-10-25 ENCOUNTER — Inpatient Hospital Stay: Payer: PPO | Attending: Internal Medicine

## 2020-10-25 ENCOUNTER — Inpatient Hospital Stay (HOSPITAL_BASED_OUTPATIENT_CLINIC_OR_DEPARTMENT_OTHER): Payer: PPO | Admitting: Internal Medicine

## 2020-10-25 ENCOUNTER — Inpatient Hospital Stay: Payer: PPO

## 2020-10-25 ENCOUNTER — Other Ambulatory Visit: Payer: Self-pay

## 2020-10-25 ENCOUNTER — Encounter: Payer: Self-pay | Admitting: Internal Medicine

## 2020-10-25 DIAGNOSIS — I252 Old myocardial infarction: Secondary | ICD-10-CM | POA: Insufficient documentation

## 2020-10-25 DIAGNOSIS — I129 Hypertensive chronic kidney disease with stage 1 through stage 4 chronic kidney disease, or unspecified chronic kidney disease: Secondary | ICD-10-CM | POA: Diagnosis not present

## 2020-10-25 DIAGNOSIS — D469 Myelodysplastic syndrome, unspecified: Secondary | ICD-10-CM | POA: Insufficient documentation

## 2020-10-25 DIAGNOSIS — D4620 Refractory anemia with excess of blasts, unspecified: Secondary | ICD-10-CM

## 2020-10-25 DIAGNOSIS — K219 Gastro-esophageal reflux disease without esophagitis: Secondary | ICD-10-CM | POA: Diagnosis not present

## 2020-10-25 DIAGNOSIS — D631 Anemia in chronic kidney disease: Secondary | ICD-10-CM | POA: Insufficient documentation

## 2020-10-25 DIAGNOSIS — D500 Iron deficiency anemia secondary to blood loss (chronic): Secondary | ICD-10-CM

## 2020-10-25 DIAGNOSIS — I509 Heart failure, unspecified: Secondary | ICD-10-CM | POA: Insufficient documentation

## 2020-10-25 DIAGNOSIS — R5383 Other fatigue: Secondary | ICD-10-CM | POA: Diagnosis not present

## 2020-10-25 DIAGNOSIS — Z79899 Other long term (current) drug therapy: Secondary | ICD-10-CM | POA: Diagnosis not present

## 2020-10-25 DIAGNOSIS — N183 Chronic kidney disease, stage 3 unspecified: Secondary | ICD-10-CM | POA: Diagnosis not present

## 2020-10-25 DIAGNOSIS — I2510 Atherosclerotic heart disease of native coronary artery without angina pectoris: Secondary | ICD-10-CM | POA: Diagnosis not present

## 2020-10-25 LAB — CBC WITH DIFFERENTIAL/PLATELET
Abs Immature Granulocytes: 0 10*3/uL (ref 0.00–0.07)
Band Neutrophils: 1 %
Basophils Absolute: 0 10*3/uL (ref 0.0–0.1)
Basophils Relative: 0 %
Eosinophils Absolute: 0.1 10*3/uL (ref 0.0–0.5)
Eosinophils Relative: 1 %
HCT: 28.8 % — ABNORMAL LOW (ref 39.0–52.0)
Hemoglobin: 9.3 g/dL — ABNORMAL LOW (ref 13.0–17.0)
Lymphocytes Relative: 41 %
Lymphs Abs: 2.7 10*3/uL (ref 0.7–4.0)
MCH: 31.5 pg (ref 26.0–34.0)
MCHC: 32.3 g/dL (ref 30.0–36.0)
MCV: 97.6 fL (ref 80.0–100.0)
Monocytes Absolute: 1.5 10*3/uL — ABNORMAL HIGH (ref 0.1–1.0)
Monocytes Relative: 23 %
Neutro Abs: 2.3 10*3/uL (ref 1.7–7.7)
Neutrophils Relative %: 34 %
Platelets: 143 10*3/uL — ABNORMAL LOW (ref 150–400)
RBC: 2.95 MIL/uL — ABNORMAL LOW (ref 4.22–5.81)
RDW: 19.3 % — ABNORMAL HIGH (ref 11.5–15.5)
Smear Review: ADEQUATE
WBC: 6.5 10*3/uL (ref 4.0–10.5)
nRBC: 0 % (ref 0.0–0.2)

## 2020-10-25 MED ORDER — DARBEPOETIN ALFA 300 MCG/0.6ML IJ SOSY
300.0000 ug | PREFILLED_SYRINGE | Freq: Once | INTRAMUSCULAR | Status: AC
  Administered 2020-10-25: 300 ug via SUBCUTANEOUS
  Filled 2020-10-25: qty 0.6

## 2020-10-25 NOTE — Assessment & Plan Note (Signed)
#  Chronic anemia-MDS/CKD-III [s/p BMBx-low-grade myelomonocytic; FEB 2022]; deletion 20.   I reviewed the bone marrow biopsy/pathology with the patient and son in detail.  Continue aranesp every 2 weeks for now.  Foundation 1 heme-TET2* no targets  #Today hemoglobin is 9.3; Proceed  aranesp.  Continue p.o. iron  # Mild intermittent thrombocytopenia-question ITP [nadir November 2020-80s].  Today platelets 250s ; ok to with asprin.  Stable  #Recent acute CHF-2025% ejection fraction [[facility]; MAY 2022]-see below ;on  Lasix 20 mg a day [half pill once a day]; STABLE.   #CKD stage III-GFR-39; stable  #Recent upper GI bleed-likely secondary to angio dysplastic lesion in the small bowel status post cautery.  Continue p.o. iron.  #Left upper lobe cystic lesion-[incidentally] CT scan- AUG 25th- 2021- CT scan-left upper lobe cystic lesion-4.5 cm-/slightly bigger by few mm-stable  #Disposition: #  Aranesp today- follow up in [place] moving forward # in 2 weeks- cbc possible aranesp  # in 4 weeks- cbc possible aranesp #  in 6 weeks- cbc possible aranesp # in 8 weeks- cbc possible aranesp # in 10 weeks- cbc possible aranesp # in 12 weeks MD-cbc/bmp; possible aranesp; Dr.B

## 2020-10-25 NOTE — Progress Notes (Signed)
Has been admitted a couple times since last visit. Had GI bleed and fluid on lungs.

## 2020-10-25 NOTE — Progress Notes (Signed)
[place] CONSULT NOTE  Patient Care Team: Rusty Aus, MD as PCP - General (Internal Medicine) Alisa Graff, FNP as Nurse Practitioner (Family Medicine) Ubaldo Glassing Javier Docker, MD as Consulting Physician (Cardiology) Erby Pian, MD as Referring Physician (Specialist) Cammie Sickle, MD as Consulting Physician (Hematology and Oncology) Jason Coop, NP as Nurse Practitioner (Hospice and Palliative Medicine)  CHIEF COMPLAINTS/PURPOSE OF CONSULTATION: anemia  # MDS  # Hx of GIB [s/p EGD-angiodysplastic lesion status post cautery]  #CKD CHF    HISTORY OF PRESENTING ILLNESS:  Andres Gutierrez 87 y.o.  male history of above history of chronic anemia and intermittent thrombocytopenia is here for follow-up/ currently on IV iron and also Aranesp here for follow-up.  Patient was recently admitted to hospital for CHF-s/p diuresis.  Otherwise denies any blood in stools or black-colored stool.  Chronic fatigue.  Walks with a walker  Review of Systems  Constitutional:  Positive for malaise/fatigue. Negative for chills, diaphoresis, fever and weight loss.  HENT:  Negative for nosebleeds and sore throat.   Eyes:  Negative for double vision.  Respiratory:  Positive for shortness of breath. Negative for cough, hemoptysis, sputum production and wheezing.   Cardiovascular:  Negative for chest pain, palpitations, orthopnea and leg swelling.  Gastrointestinal:  Negative for abdominal pain, blood in stool, constipation, diarrhea, heartburn, melena, nausea and vomiting.  Genitourinary:  Negative for dysuria, frequency and urgency.  Musculoskeletal:  Positive for back pain and joint pain.  Skin: Negative.  Negative for itching and rash.  Neurological:  Negative for dizziness, tingling, focal weakness, weakness and headaches.  Endo/Heme/Allergies:  Does not bruise/bleed easily.  Psychiatric/Behavioral:  Negative for depression. The patient is not nervous/anxious  and does not have insomnia.     MEDICAL HISTORY:  Past Medical History:  Diagnosis Date  . Anemia   . Anginal pain ([place])   . Anxiety   . Cardiomyopathy ([place])   . Cerebral amyloid angiopathy ([place])   . CHF (congestive heart failure) ([place])   . Chronic kidney disease    kidney stones  . Coronary artery disease   . Cough   . Dyspnea   . GERD (gastroesophageal reflux disease)   . History of kidney stones   . Hypertension   . Lymphadenopathy, hilar   . MDS (myelodysplastic syndrome) ([place])   . MI (myocardial infarction) ([place])    x 2  . Wheezing     SURGICAL HISTORY: Past Surgical History:  Procedure Laterality Date  . BRONCHIAL NEEDLE ASPIRATION BIOPSY N/A 12/31/2014   Procedure: BRONCHIAL NEEDLE ASPIRATION BIOPSIES from carina;  Surgeon: Flora Lipps, MD;  Location: [facility];  Service: Cardiopulmonary;  Laterality: N/A;  . CARDIAC CATHETERIZATION N/A 12/28/2015   Procedure: Left Heart Cath and Cors/Grafts Angiography;  Surgeon: Yolonda Kida, MD;  Location: [facility];  Service: Cardiovascular;  Laterality: N/A;  . CATARACT EXTRACTION W/PHACO Left 08/01/2016   Procedure: CATARACT EXTRACTION PHACO AND INTRAOCULAR LENS PLACEMENT ([place]) Left;  Surgeon: Leandrew Koyanagi, MD;  Location: [facility];  Service: Ophthalmology;  Laterality: Left;  . CHOLECYSTECTOMY    . COLON SURGERY    . CORONARY ANGIOPLASTY WITH STENT PLACEMENT    . CORONARY ARTERY BYPASS GRAFT    . CYSTOSCOPY/URETEROSCOPY/HOLMIUM LASER/STENT PLACEMENT Left 03/18/2020   Procedure: CYSTOSCOPY/URETEROSCOPY/HOLMIUM LASER/STENT PLACEMENT;  Surgeon: Billey Co, MD;  Location: [facility];  Service: Urology;  Laterality: Left;  . ENDOBRONCHIAL ULTRASOUND N/A 12/31/2014   Procedure: ENDOBRONCHIAL ULTRASOUND;  Surgeon: Maretta Bees  Mortimer Fries, MD;  Location: [facility];  Service: Cardiopulmonary;  Laterality: N/A;  . ESOPHAGOGASTRODUODENOSCOPY N/A 08/15/2020   Procedure: ESOPHAGOGASTRODUODENOSCOPY (EGD);  Surgeon: Toledo,  Benay Pike, MD;  Location: [facility] ENDOSCOPY;  Service: Gastroenterology;  Laterality: N/A;  . ESOPHAGOGASTRODUODENOSCOPY (EGD) WITH PROPOFOL N/A 06/20/2015   Procedure: ESOPHAGOGASTRODUODENOSCOPY (EGD) WITH PROPOFOL;  Surgeon: Lollie Sails, MD;  Location: [facility] ENDOSCOPY;  Service: Endoscopy;  Laterality: N/A;  . IR CATHETER TUBE CHANGE  10/26/2019  . IR CHOLANGIOGRAM EXISTING TUBE  08/13/2019    SOCIAL HISTORY: lives in [place]; alone. 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  Social History   Socioeconomic History  . Marital status: Widowed    Spouse name: Not on file  . Number of children: 2  . Years of education: college  . Highest education level: Some college, no degree  Occupational History  . Occupation: retired  Tobacco Use  . Smoking status: Former    Pack years: 0.00  . Smokeless tobacco: Current    Types: Chew  Vaping Use  . Vaping Use: Never used  Substance and Sexual Activity  . Alcohol use: Yes    Comment: occational almost rare once a year  . Drug use: No  . Sexual activity: Not Currently    Birth control/protection: Abstinence  Other Topics Concern  . Not on file  Social History Narrative  . Not on file   Social Determinants of Health   Financial Resource Strain: Not on file  Food Insecurity: Not on file  Transportation Needs: Not on file  Physical Activity: Not on file  Stress: Not on file  Social Connections: Not on file  Intimate Partner Violence: Not on file    FAMILY HISTORY: Family History  Problem Relation Age of Onset  . CAD Mother   . Colon cancer Father     ALLERGIES:  is allergic to levaquin [levofloxacin], escitalopram, 5ht3 receptor antagonists, diltiazem, diphenhydramine hcl, maxidex [dexamethasone], prednisone, serotonin, vytorin [ezetimibe-simvastatin], and zocor [simvastatin].  MEDICATIONS:  Current Outpatient Medications  Medication Sig Dispense Refill  . atorvastatin (LIPITOR) 40 MG tablet Take 1 tablet (40 mg total) by mouth daily with  supper.    . digoxin (LANOXIN) 0.125 MG tablet Take 0.0625 mg by mouth daily.    . famotidine (PEPCID) 20 MG tablet Take 20 mg by mouth at bedtime.    . ferrous sulfate 325 (65 FE) MG tablet Take 1 tablet (325 mg total) by mouth every Monday, Wednesday, and Friday. With supper    . furosemide (LASIX) 20 MG tablet Take 1 tablet (20 mg total) by mouth daily. (Patient taking differently: Take 20 mg by mouth daily as needed. 3 lb wt gain overnight.) 30 tablet 0  . metoprolol tartrate (LOPRESSOR) 25 MG tablet Take 12.5 mg by mouth 2 (two) times daily.    . Multiple Vitamin (MULTIVITAMIN) tablet Take 1 tablet by mouth daily.    . pantoprazole (PROTONIX) 40 MG tablet Take 1 tablet (40 mg total) by mouth 2 (two) times daily. 60 tablet 1  . polyethylene glycol (MIRALAX / GLYCOLAX) 17 g packet Take 17 g by mouth daily as needed for mild constipation. 14 each 0  . ranolazine (RANEXA) 1000 MG SR tablet Take 500 mg by mouth 2 (two) times daily.     . sucralfate (CARAFATE) 1 G tablet Take 1 g by mouth 2 (two) times daily.     . tamsulosin (FLOMAX) 0.4 MG CAPS capsule Take 0.4 mg by mouth daily after supper.     . vitamin  B-12 (CYANOCOBALAMIN) 500 MCG tablet Take 500 mcg by mouth daily.     No current facility-administered medications for this visit.     PHYSICAL EXAMINATION: ECOG PERFORMANCE STATUS: 0 - Asymptomatic  Vitals:   10/25/20 0951  BP: 137/60  Pulse: 61  Resp: 16  Temp: (!) 96.1 F (35.6 C)  SpO2: 100%   Filed Weights   10/25/20 0951  Weight: 183 lb (83 kg)    Physical Exam Constitutional:      Comments: Elderly Caucasian male patient. He accompanied by his son.  Patient ambulating independently.   HENT:     Head: Normocephalic and atraumatic.     Mouth/Throat:     Pharynx: No oropharyngeal exudate.  Eyes:     Pupils: Pupils are equal, round, and reactive to light.  Cardiovascular:     Rate and Rhythm: Normal rate and regular rhythm.  Pulmonary:     Effort: No respiratory  distress.     Breath sounds: Normal breath sounds. No wheezing.  Abdominal:     General: Bowel sounds are normal. There is no distension.     Palpations: Abdomen is soft. There is no mass.     Tenderness: no abdominal tenderness There is no guarding or rebound.  Musculoskeletal:        General: No tenderness. Normal range of motion.     Cervical back: Normal range of motion and neck supple.  Skin:    General: Skin is warm.  Neurological:     Mental Status: He is alert and oriented to person, place, and time.  Psychiatric:        Mood and Affect: Affect normal.   .   LABORATORY DATA:  I have reviewed the data as listed Lab Results  Component Value Date   WBC 6.5 10/25/2020   HGB 9.3 (L) 10/25/2020   HCT 28.8 (L) 10/25/2020   MCV 97.6 10/25/2020   PLT 143 (L) 10/25/2020   Recent Labs    11/11/19 1104 01/07/20 0916 02/18/20 1036 08/12/20 1553 08/13/20 0532 08/15/20 0552 09/20/20 1049 10/03/20 1053 10/04/20 0541  NA 139 137   < > 134* 133*   < > 136 133* 133*  K 4.6 3.8   < > 4.1 4.2   < > 4.1 3.9 4.7  CL 104 104   < > 107 107   < > 103 101 101  CO2 26 23   < > 18* 19*   < > 22 21* 21*  GLUCOSE 112* 119*   < > 106* 86   < > 105* 105* 97  BUN 21 23   < > 24* 22   < > 21 20 21   CREATININE 1.51* 1.58*   < > 1.70* 1.53*   < > 1.76* 1.71* 1.78*  CALCIUM 9.0 8.7*   < > 8.6* 8.3*   < > 8.8* 9.3 8.8*  GFRNONAA 41* 39*   < > 39* 44*   < > 37* 38* 36*  GFRAA 48* 45*  --   --   --   --   --   --   --   PROT  --   --    < > 7.0 6.2*  --   --  7.9  --   ALBUMIN  --   --    < > 4.1 3.6  --   --  4.5  --   AST  --   --    < > 22 19  --   --  22  --   ALT  --   --    < > 14 10  --   --  13  --   ALKPHOS  --   --    < > 64 52  --   --  69  --   BILITOT  --   --    < > 0.7 0.9  --   --  1.1  --    < > = values in this interval not displayed.   ASSESSMENT & PLAN:   MDS (myelodysplastic syndrome), low grade (H[facility]) #Chronic anemia-MDS/CKD-III [s/p BMBx-low-grade myelomonocytic; FEB  2022]; deletion 20.   I reviewed the bone marrow biopsy/pathology with the patient and son in detail.  Continue aranesp every 2 weeks for now.  Foundation 1 heme-TET2* no targets  #Today hemoglobin is 9.3; Proceed  aranesp.  Continue p.o. iron  # Mild intermittent thrombocytopenia-question ITP [nadir November 2020-80s].  Today platelets 250s ; ok to with asprin.  Stable  #Recent acute CHF-2025% ejection fraction [[facility]; MAY 2022]-see below ;on  Lasix 20 mg a day [half pill once a day]; STABLE.   #CKD stage III-GFR-39; stable  #Recent upper GI bleed-likely secondary to angio dysplastic lesion in the small bowel status post cautery.  Continue p.o. iron.  #Left upper lobe cystic lesion-[incidentally] CT scan- AUG 25th- 2021- CT scan-left upper lobe cystic lesion-4.5 cm-/slightly bigger by few mm-stable  #Disposition: #  Aranesp today- follow up in [place] moving forward # in 2 weeks- cbc possible aranesp  # in 4 weeks- cbc possible aranesp #  in 6 weeks- cbc possible aranesp # in 8 weeks- cbc possible aranesp # in 10 weeks- cbc possible aranesp # in 12 weeks MD-cbc/bmp; possible aranesp; Dr.B      Cammie Sickle, MD 11/06/2020 8:21 PM

## 2020-11-06 ENCOUNTER — Encounter: Payer: Self-pay | Admitting: Nurse Practitioner

## 2020-11-06 ENCOUNTER — Other Ambulatory Visit: Payer: Self-pay

## 2020-11-06 DIAGNOSIS — D4620 Refractory anemia with excess of blasts, unspecified: Secondary | ICD-10-CM

## 2020-11-06 DIAGNOSIS — N183 Chronic kidney disease, stage 3 unspecified: Secondary | ICD-10-CM

## 2020-11-08 ENCOUNTER — Other Ambulatory Visit: Payer: Self-pay

## 2020-11-08 ENCOUNTER — Inpatient Hospital Stay: Payer: PPO

## 2020-11-08 VITALS — BP 115/68 | HR 68 | Temp 97.0°F | Resp 18

## 2020-11-08 DIAGNOSIS — D500 Iron deficiency anemia secondary to blood loss (chronic): Secondary | ICD-10-CM

## 2020-11-08 DIAGNOSIS — N183 Chronic kidney disease, stage 3 unspecified: Secondary | ICD-10-CM

## 2020-11-08 DIAGNOSIS — I129 Hypertensive chronic kidney disease with stage 1 through stage 4 chronic kidney disease, or unspecified chronic kidney disease: Secondary | ICD-10-CM | POA: Diagnosis not present

## 2020-11-08 DIAGNOSIS — D631 Anemia in chronic kidney disease: Secondary | ICD-10-CM

## 2020-11-08 LAB — CBC WITH DIFFERENTIAL/PLATELET
Abs Immature Granulocytes: 0.07 10*3/uL (ref 0.00–0.07)
Basophils Absolute: 0 10*3/uL (ref 0.0–0.1)
Basophils Relative: 0 %
Eosinophils Absolute: 0.1 10*3/uL (ref 0.0–0.5)
Eosinophils Relative: 1 %
HCT: 30.8 % — ABNORMAL LOW (ref 39.0–52.0)
Hemoglobin: 9.8 g/dL — ABNORMAL LOW (ref 13.0–17.0)
Immature Granulocytes: 1 %
Lymphocytes Relative: 31 %
Lymphs Abs: 2.3 10*3/uL (ref 0.7–4.0)
MCH: 31.4 pg (ref 26.0–34.0)
MCHC: 31.8 g/dL (ref 30.0–36.0)
MCV: 98.7 fL (ref 80.0–100.0)
Monocytes Absolute: 2.4 10*3/uL — ABNORMAL HIGH (ref 0.1–1.0)
Monocytes Relative: 32 %
Neutro Abs: 2.6 10*3/uL (ref 1.7–7.7)
Neutrophils Relative %: 35 %
Platelets: 134 10*3/uL — ABNORMAL LOW (ref 150–400)
RBC: 3.12 MIL/uL — ABNORMAL LOW (ref 4.22–5.81)
RDW: 20.7 % — ABNORMAL HIGH (ref 11.5–15.5)
Smear Review: ADEQUATE
WBC: 7.4 10*3/uL (ref 4.0–10.5)
nRBC: 0 % (ref 0.0–0.2)

## 2020-11-08 MED ORDER — DARBEPOETIN ALFA 300 MCG/0.6ML IJ SOSY
300.0000 ug | PREFILLED_SYRINGE | Freq: Once | INTRAMUSCULAR | Status: AC
  Administered 2020-11-08: 300 ug via SUBCUTANEOUS

## 2020-11-16 ENCOUNTER — Telehealth: Payer: Self-pay | Admitting: Primary Care

## 2020-11-16 NOTE — Telephone Encounter (Signed)
Called to change appt. Will see 12/01/20 instead of 11/17/20.

## 2020-11-17 ENCOUNTER — Other Ambulatory Visit: Payer: Self-pay | Admitting: Primary Care

## 2020-11-22 ENCOUNTER — Encounter: Payer: Self-pay | Admitting: Intensive Care

## 2020-11-22 ENCOUNTER — Emergency Department: Payer: PPO

## 2020-11-22 ENCOUNTER — Emergency Department
Admission: EM | Admit: 2020-11-22 | Discharge: 2020-11-22 | Disposition: A | Discharge status: Home / Self Care | Payer: PPO | Attending: Emergency Medicine | Admitting: Emergency Medicine

## 2020-11-22 ENCOUNTER — Inpatient Hospital Stay: Payer: PPO

## 2020-11-22 ENCOUNTER — Other Ambulatory Visit: Payer: Self-pay

## 2020-11-22 DIAGNOSIS — I6782 Cerebral ischemia: Secondary | ICD-10-CM | POA: Diagnosis not present

## 2020-11-22 DIAGNOSIS — R001 Bradycardia, unspecified: Secondary | ICD-10-CM | POA: Diagnosis not present

## 2020-11-22 DIAGNOSIS — Z8616 Personal history of COVID-19: Secondary | ICD-10-CM | POA: Diagnosis not present

## 2020-11-22 DIAGNOSIS — R519 Headache, unspecified: Secondary | ICD-10-CM | POA: Diagnosis not present

## 2020-11-22 DIAGNOSIS — I10 Essential (primary) hypertension: Secondary | ICD-10-CM | POA: Diagnosis not present

## 2020-11-22 DIAGNOSIS — I130 Hypertensive heart and chronic kidney disease with heart failure and stage 1 through stage 4 chronic kidney disease, or unspecified chronic kidney disease: Secondary | ICD-10-CM | POA: Diagnosis not present

## 2020-11-22 DIAGNOSIS — Z79899 Other long term (current) drug therapy: Secondary | ICD-10-CM | POA: Diagnosis not present

## 2020-11-22 DIAGNOSIS — Z87891 Personal history of nicotine dependence: Secondary | ICD-10-CM | POA: Insufficient documentation

## 2020-11-22 DIAGNOSIS — Z951 Presence of aortocoronary bypass graft: Secondary | ICD-10-CM | POA: Diagnosis not present

## 2020-11-22 DIAGNOSIS — I2510 Atherosclerotic heart disease of native coronary artery without angina pectoris: Secondary | ICD-10-CM | POA: Diagnosis not present

## 2020-11-22 DIAGNOSIS — Y900 Blood alcohol level of less than 20 mg/100 ml: Secondary | ICD-10-CM | POA: Diagnosis not present

## 2020-11-22 DIAGNOSIS — H538 Other visual disturbances: Secondary | ICD-10-CM | POA: Insufficient documentation

## 2020-11-22 DIAGNOSIS — R29818 Other symptoms and signs involving the nervous system: Secondary | ICD-10-CM | POA: Diagnosis not present

## 2020-11-22 DIAGNOSIS — N1832 Chronic kidney disease, stage 3b: Secondary | ICD-10-CM | POA: Diagnosis not present

## 2020-11-22 DIAGNOSIS — R52 Pain, unspecified: Secondary | ICD-10-CM | POA: Diagnosis not present

## 2020-11-22 DIAGNOSIS — I672 Cerebral atherosclerosis: Secondary | ICD-10-CM | POA: Diagnosis not present

## 2020-11-22 DIAGNOSIS — M542 Cervicalgia: Secondary | ICD-10-CM | POA: Diagnosis not present

## 2020-11-22 DIAGNOSIS — I5022 Chronic systolic (congestive) heart failure: Secondary | ICD-10-CM | POA: Insufficient documentation

## 2020-11-22 DIAGNOSIS — I639 Cerebral infarction, unspecified: Secondary | ICD-10-CM | POA: Diagnosis not present

## 2020-11-22 DIAGNOSIS — I499 Cardiac arrhythmia, unspecified: Secondary | ICD-10-CM | POA: Diagnosis not present

## 2020-11-22 DIAGNOSIS — M79603 Pain in arm, unspecified: Secondary | ICD-10-CM | POA: Diagnosis not present

## 2020-11-22 LAB — PROTIME-INR
INR: 1.2 (ref 0.8–1.2)
Prothrombin Time: 15.2 seconds (ref 11.4–15.2)

## 2020-11-22 LAB — COMPREHENSIVE METABOLIC PANEL
ALT: 14 U/L (ref 0–44)
AST: 21 U/L (ref 15–41)
Albumin: 4.6 g/dL (ref 3.5–5.0)
Alkaline Phosphatase: 63 U/L (ref 38–126)
Anion gap: 11 (ref 5–15)
BUN: 44 mg/dL — ABNORMAL HIGH (ref 8–23)
CO2: 21 mmol/L — ABNORMAL LOW (ref 22–32)
Calcium: 9.1 mg/dL (ref 8.9–10.3)
Chloride: 103 mmol/L (ref 98–111)
Creatinine, Ser: 2.21 mg/dL — ABNORMAL HIGH (ref 0.61–1.24)
GFR, Estimated: 28 mL/min — ABNORMAL LOW (ref >60)
Glucose, Bld: 95 mg/dL (ref 70–99)
Potassium: 4.6 mmol/L (ref 3.5–5.1)
Sodium: 135 mmol/L (ref 135–145)
Total Bilirubin: 1.6 mg/dL — ABNORMAL HIGH (ref 0.3–1.2)
Total Protein: 7.8 g/dL (ref 6.5–8.1)

## 2020-11-22 LAB — CBC
HCT: 30.5 % — ABNORMAL LOW (ref 39.0–52.0)
Hemoglobin: 9.9 g/dL — ABNORMAL LOW (ref 13.0–17.0)
MCH: 32.8 pg (ref 26.0–34.0)
MCHC: 32.5 g/dL (ref 30.0–36.0)
MCV: 101 fL — ABNORMAL HIGH (ref 80.0–100.0)
Platelets: 130 10*3/uL — ABNORMAL LOW (ref 150–400)
RBC: 3.02 MIL/uL — ABNORMAL LOW (ref 4.22–5.81)
RDW: 19.9 % — ABNORMAL HIGH (ref 11.5–15.5)
WBC: 6.3 10*3/uL (ref 4.0–10.5)
nRBC: 0 % (ref 0.0–0.2)

## 2020-11-22 LAB — ETHANOL: Alcohol, Ethyl (B): <10 mg/dL (ref <10)

## 2020-11-22 LAB — CBG MONITORING, ED: Glucose-Capillary: 90 mg/dL (ref 70–99)

## 2020-11-22 MED ORDER — SODIUM CHLORIDE 0.9 % IV BOLUS
500.0000 mL | Freq: Once | INTRAVENOUS | Status: AC
  Administered 2020-11-22: 500 mL via INTRAVENOUS

## 2020-11-22 NOTE — Progress Notes (Signed)
CODE STROKE- PHARMACY COMMUNICATION   Time CODE STROKE called/page received: 0829  Time response to CODE STROKE was made: Immediately  Time Stroke Kit retrieved from Pyxis: N/A, no tPA  Name of Provider/Nurse contacted: Dr. Rory Percy  Past Medical History:  Diagnosis Date   Anemia    Anginal pain ([place])    Anxiety    Cardiomyopathy ([place])    Cerebral amyloid angiopathy ([place])    CHF (congestive heart failure) (HCC)    Chronic kidney disease    kidney stones   Coronary artery disease    Cough    Dyspnea    GERD (gastroesophageal reflux disease)    History of kidney stones    Hypertension    Lymphadenopathy, hilar    MDS (myelodysplastic syndrome) ([place])    MI (myocardial infarction) ([place])    x 2   Wheezing    Benita Gutter 11/22/2020  8:42 AM

## 2020-11-22 NOTE — Progress Notes (Signed)
  Chaplain On-Call responded to Code Stroke notification.  Met the patient and his son-in-law Servando [place] at bedside.  Provided spiritual and emotional support, and assured them of continuing availability as needed.  Chaplain Pollyann Samples M.Div., [facility]

## 2020-11-22 NOTE — ED Provider Notes (Signed)
[facility] Emergency Department Provider Note   ____________________________________________    I have reviewed the triage vital signs and the nursing notes.   HISTORY  Chief Complaint Weakness and Eye Problem     HPI Andres Gutierrez is a 87 y.o. male with extensive past medical history as detailed below who presents with complaints of blurry vision on the right.  Patient reports he woke up at 4 AM, was feeling at his baseline when at approximately 5 AM he developed a acute pain in his neck and right side of his head and reports that his right eye became extremely blurry for approximately 5 to 10 minutes.  He reports it is improved now however is still blurrier"than usual ".  Denies extremity weakness.  Past Medical History:  Diagnosis Date   Anemia    Anginal pain (HCC)    Anxiety    Cardiomyopathy ([place])    Cerebral amyloid angiopathy (HCC)    CHF (congestive heart failure) (HCC)    Chronic kidney disease    kidney stones   Coronary artery disease    Cough    Dyspnea    GERD (gastroesophageal reflux disease)    History of kidney stones    Hypertension    Lymphadenopathy, hilar    MDS (myelodysplastic syndrome) ([place])    MI (myocardial infarction) ([place])    x 2   Wheezing     Patient Active Problem List   Diagnosis Date Noted   Acute on chronic systolic congestive heart failure ([place]) 10/03/2020   Hypomagnesemia 10/03/2020   Pleural effusion 10/03/2020   Chest pain 10/03/2020   GI bleed 08/12/2020   MDS (myelodysplastic syndrome), low grade ([place]) 07/22/2020   History of 2019 novel coronavirus disease (COVID-19) 05/19/2020   Cerebral amyloid angiopathy ([place]) 03/05/2020   Abdominal pain 03/03/2020   HLD (hyperlipidemia) 03/03/2020   Blurry vision 03/03/2020   CKD (chronic kidney disease), stage IIIb 03/03/2020   Left ureteral stone 03/03/2020   Generalized weakness    Cerebral hemorrhage, nontraumatic ([place]) 03/02/2020   Chronic  cholecystitis 11/06/2019   Malnutrition of moderate degree 07/30/2019   Acute neutrophilia 07/30/2019   Sepsis ([place]) 07/29/2019   Acute cholecystitis 07/29/2019   Foot infection    Acute on chronic heart failure ([place]) 06/07/2019   Weakness    NSTEMI (non-ST elevated myocardial infarction) ([place])    Orthostatic hypotension 01/07/2019   Symptomatic anemia 08/05/2018   Anemia of chronic kidney failure, stage 3 (moderate) ([place]) 08/05/2018   BPH (benign prostatic hyperplasia) 07/24/2018   Gastroesophageal reflux disease with esophagitis 07/24/2018   Stage 3 chronic kidney disease ([place]) 07/24/2018   Thrombocytopenia ([place]) 07/24/2018   Primary osteoarthritis of both knees 08/22/2017   Medicare annual wellness visit, initial 37/02/6268   Chronic systolic CHF (congestive heart failure), NYHA class 3 ([place]) 48/54/6270   Chronic systolic heart failure ([place]) 01/17/2016   Chewing tobacco use 01/17/2016   Atrial fibrillation with RVR ([place]) 12/23/2015   Dyspnea on exertion 12/23/2015   Elevated troponin 12/23/2015   GERD (gastroesophageal reflux disease) 12/23/2015   Iron deficiency anemia due to chronic blood loss 12/20/2015   Femoral neck fracture, left, closed, initial encounter 11/03/2015   Chronic atrial fibrillation (HCC)    Coronary artery disease involving native coronary artery of native heart without angina pectoris    Acalculous cholecystitis 10/29/2015   B12 deficiency 08/03/2015   Erosive esophagitis 08/03/2015   Adenopathy    CAD (coronary artery disease) 12/22/2014  HTN (hypertension) 12/22/2014   Cardiomyopathy, ischemic 07/27/2014    Past Surgical History:  Procedure Laterality Date   BRONCHIAL NEEDLE ASPIRATION BIOPSY N/A 12/31/2014   Procedure: BRONCHIAL NEEDLE ASPIRATION BIOPSIES from carina;  Surgeon: Flora Lipps, MD;  Location: [facility];  Service: Cardiopulmonary;  Laterality: N/A;   CARDIAC CATHETERIZATION N/A 12/28/2015   Procedure: Left Heart Cath and Cors/Grafts  Angiography;  Surgeon: Yolonda Kida, MD;  Location: [facility];  Service: Cardiovascular;  Laterality: N/A;   CATARACT EXTRACTION W/PHACO Left 08/01/2016   Procedure: CATARACT EXTRACTION PHACO AND INTRAOCULAR LENS PLACEMENT ([place]) Left;  Surgeon: Leandrew Koyanagi, MD;  Location: [place];  Service: Ophthalmology;  Laterality: Left;   CHOLECYSTECTOMY     COLON SURGERY     CORONARY ANGIOPLASTY WITH STENT PLACEMENT     CORONARY ARTERY BYPASS GRAFT     CYSTOSCOPY/URETEROSCOPY/HOLMIUM LASER/STENT PLACEMENT Left 03/18/2020   Procedure: CYSTOSCOPY/URETEROSCOPY/HOLMIUM LASER/STENT PLACEMENT;  Surgeon: Billey Co, MD;  Location: [facility];  Service: Urology;  Laterality: Left;   ENDOBRONCHIAL ULTRASOUND N/A 12/31/2014   Procedure: ENDOBRONCHIAL ULTRASOUND;  Surgeon: Flora Lipps, MD;  Location: [facility];  Service: Cardiopulmonary;  Laterality: N/A;   ESOPHAGOGASTRODUODENOSCOPY N/A 08/15/2020   Procedure: ESOPHAGOGASTRODUODENOSCOPY (EGD);  Surgeon: Toledo, Benay Pike, MD;  Location: [facility];  Service: Gastroenterology;  Laterality: N/A;   ESOPHAGOGASTRODUODENOSCOPY (EGD) WITH PROPOFOL N/A 06/20/2015   Procedure: ESOPHAGOGASTRODUODENOSCOPY (EGD) WITH PROPOFOL;  Surgeon: Lollie Sails, MD;  Location: [facility];  Service: Endoscopy;  Laterality: N/A;   IR CATHETER TUBE CHANGE  10/26/2019   IR CHOLANGIOGRAM EXISTING TUBE  08/13/2019    Prior to Admission medications   Medication Sig Start Date End Date Taking? Authorizing Provider  atorvastatin (LIPITOR) 40 MG tablet Take 1 tablet (40 mg total) by mouth daily with supper. 02/27/20  Yes Enzo Bi, MD  digoxin (LANOXIN) 0.125 MG tablet Take 0.0625 mg by mouth daily.   Yes [provider]  famotidine (PEPCID) 20 MG tablet Take 20 mg by mouth at bedtime. 02/04/19  Yes [provider]  ferrous sulfate 325 (65 FE) MG tablet Take 1 tablet (325 mg total) by mouth every Monday, Wednesday, and Friday. With supper  02/29/20  Yes Enzo Bi, MD  furosemide (LASIX) 20 MG tablet Take 1 tablet (20 mg total) by mouth daily. Patient taking differently: Take 10 mg by mouth daily as needed. 3 lb wt gain overnight. 10/04/20  Yes Lorella Nimrod, MD  metoprolol tartrate (LOPRESSOR) 25 MG tablet Take 12.5 mg by mouth 2 (two) times daily.   Yes [provider]  Multiple Vitamin (MULTIVITAMIN) tablet Take 1 tablet by mouth daily.   Yes [provider]  pantoprazole (PROTONIX) 40 MG tablet Take 1 tablet (40 mg total) by mouth 2 (two) times daily. 08/17/20  Yes Lorella Nimrod, MD  ranolazine (RANEXA) 1000 MG SR tablet Take 500 mg by mouth 2 (two) times daily.  01/16/19  Yes [provider]  sucralfate (CARAFATE) 1 G tablet Take 1 g by mouth 2 (two) times daily.    Yes [provider]  tamsulosin (FLOMAX) 0.4 MG CAPS capsule Take 0.4 mg by mouth daily after supper.    Yes [provider]  vitamin B-12 (CYANOCOBALAMIN) 500 MCG tablet Take 500 mcg by mouth daily.   Yes [provider]  polyethylene glycol (MIRALAX / GLYCOLAX) 17 g packet Take 17 g by mouth daily as needed for mild constipation. 08/17/20   Lorella Nimrod, MD     Allergies  Levaquin [levofloxacin], Escitalopram, 5ht3 receptor antagonists, Diltiazem, Diphenhydramine hcl, Maxidex [dexamethasone], Prednisone, Serotonin, Vytorin [ezetimibe-simvastatin], and Zocor [simvastatin]  Family History  Problem Relation Age of Onset   CAD Mother    Colon cancer Father     Social History Social History   Tobacco Use   Smoking status: Former    Pack years: 0.00   Smokeless tobacco: Current    Types: Nurse, children's Use: Never used  Substance Use Topics   Alcohol use: Yes    Comment: occational almost rare once a year   Drug use: No    Review of Systems  Constitutional: No fever/chills Eyes: Change in vision ENT: No sore throat. Cardiovascular: Denies chest pain. Respiratory: Denies shortness of  breath. Gastrointestinal: No abdominal pain.  No nausea, no vomiting.   Genitourinary: Negative for dysuria. Musculoskeletal: Negative for back pain. Skin: Negative for rash. Neurological: Positive headache   ____________________________________________   PHYSICAL EXAM:  VITAL SIGNS: ED Triage Vitals  Enc Vitals Group     BP 11/22/20 0809 127/67     Pulse Rate 11/22/20 0809 66     Resp 11/22/20 0809 18     Temp 11/22/20 0809 97.7 F (36.5 C)     Temp Source 11/22/20 0809 Oral     SpO2 11/22/20 0809 96 %     Weight 11/22/20 0809 82.1 kg (181 lb)     Height 11/22/20 0809 1.93 m (6\' 4" )     Head Circumference --      Peak Flow --      Pain Score 11/22/20 0804 0     Pain Loc --      Pain Edu? --      Excl. in [place]? --     Constitutional: Alert and oriented.  Eyes: Conjunctivae are normal.,  Vision checked at bedside and appears equivalent bilaterally, able to identify fingers held up at arms length with 1 eye covered, PERRLA, EOMI Head: Atraumatic. Nose: No congestion/rhinnorhea. Mouth/Throat: Mucous membranes are moist.    Cardiovascular: Normal rate, regular rhythm. Grossly normal heart sounds.  Good peripheral circulation. Respiratory: Normal respiratory effort.  No retractions. Lungs CTAB. Gastrointestinal: Soft and nontender. No distention.    Musculoskeletal: No lower extremity tenderness nor edema.  Warm and well perfused Neurologic:  Normal speech and language. No gross focal neurologic deficits are appreciated.  Normal strength in all extremities Skin:  Skin is warm, dry and intact. No rash noted. Psychiatric: Mood and affect are normal. Speech and behavior are normal.  ____________________________________________   LABS (all labs ordered are listed, but only abnormal results are displayed)  Labs Reviewed  CBC - Abnormal; Notable for the following components:      Result Value   RBC 3.02 (*)    Hemoglobin 9.9 (*)    HCT 30.5 (*)    MCV 101.0 (*)    RDW  19.9 (*)    Platelets 130 (*)    All other components within normal limits  COMPREHENSIVE METABOLIC PANEL - Abnormal; Notable for the following components:   CO2 21 (*)    BUN 44 (*)    Creatinine, Ser 2.21 (*)    Total Bilirubin 1.6 (*)    GFR, Estimated 28 (*)    All other components within normal limits  ETHANOL  PROTIME-INR  DIFFERENTIAL  APTT  CBC WITH DIFFERENTIAL/PLATELET  CBG MONITORING, ED   ____________________________________________  EKG  ED ECG REPORT I, Lavonia Drafts, the attending physician, personally viewed and  interpreted this ECG.  Date: 11/22/2020  Rhythm: normal sinus rhythm QRS Axis: normal Intervals: normal ST/T Wave abnormalities: Nonspecific changes Narrative Interpretation: no evidence of acute ischemia  ____________________________________________  RADIOLOGY  CT head reviewed by me ____________________________________________   PROCEDURES  Procedure(s) performed: No  Procedures   Critical Care performed: no ____________________________________________   INITIAL IMPRESSION / ASSESSMENT AND PLAN / ED COURSE  Pertinent labs & imaging results that were available during my care of the patient were reviewed by me and considered in my medical decision making (see chart for details).   Patient well-appearing and in no acute distress.  Onset at approximately 5 AM, reportedly was feeling well and at baseline prior to that.  Acute onset of blurry vision to the right eye.  Differential includes retinal/vitreous detachment versus TIA/CVA  Given onset of neck/headache and blurry vision code stroke activated  Neurology has seen and evaluated the patient, they doubt this is an acute stroke, have recommended MR brain, MRA head and neck  MRI results are reassuring, appropriate for discharge per neurology    ____________________________________________   FINAL CLINICAL IMPRESSION(S) / ED DIAGNOSES  Final diagnoses:  Blurry vision, right  eye        Note:  This document was prepared using Dragon voice recognition software and may include unintentional dictation errors.    Lavonia Drafts, MD 11/22/20 1544

## 2020-11-22 NOTE — Consult Note (Signed)
Neurology Consultation  Reason for Consult: Code stroke-visual disturbance Referring Physician: Dr. Lavonia Drafts, EDP  CC: Visual disturbance right  History is obtained from: Patient, family at bedside-son-in-law  HPI: Andres Gutierrez is a 87 y.o. male past medical history of anemia, cerebral amyloid angiopathy, cardiomyopathy, hypertension, myelodysplastic syndrome, CAD, presented to the emergency room with complaints of sudden onset of right neck pain and right blurred vision.  He woke up normally at 4 AM today and around 5 AM started to notice that his vision was blurred in the right eye.  He did not report any visual loss or curtain falling but says that the vision appeared blurred.  He has in the past had left occipital ICH along with evidence of cerebral amyloid angiopathy on his MRI and is currently not on any antiplatelets or anticoagulants. He has had complaints with his vision off and on for a while.  His main concern today is right neck pain which he feels is from sleeping wrong on his pillow but the right I complaints are also anemia as he had no complaints yesterday. Denies any tingling numbness weakness anywhere.  Denies any slurred speech.  Denies any facial asymmetry.  Denies any fevers chills.  Denies shortness of breath palpitations or chest pain.  Denies abdominal pain nausea vomiting.   LKW: 4 AM today tpa given?: no, cerebral amyloid angiopathy, myelodysplastic syndrome, NIH 0 Premorbid modified Rankin scale (mRS): 3-although he lives alone but uses a walker to walk and family helps with meals and shopping.  Does not drive.   ROS: Full ROS was performed and is negative except as noted in the HPI.   Past Medical History:  Diagnosis Date   Anemia    Anginal pain (HCC)    Anxiety    Cardiomyopathy ([place])    Cerebral amyloid angiopathy (HCC)    CHF (congestive heart failure) (HCC)    Chronic kidney disease    kidney stones   Coronary artery disease    Cough    Dyspnea     GERD (gastroesophageal reflux disease)    History of kidney stones    Hypertension    Lymphadenopathy, hilar    MDS (myelodysplastic syndrome) (HCC)    MI (myocardial infarction) ([place])    x 2   Wheezing         Family History  Problem Relation Age of Onset   CAD Mother    Colon cancer Father      Social History:   reports that he has quit smoking. His smokeless tobacco use includes chew. He reports current alcohol use. He reports that he does not use drugs.  Medications  Current Facility-Administered Medications:    sodium chloride 0.9 % bolus 500 mL, 500 mL, Intravenous, Once, Lavonia Drafts, MD  Current Outpatient Medications:    atorvastatin (LIPITOR) 40 MG tablet, Take 1 tablet (40 mg total) by mouth daily with supper., Disp: , Rfl:    digoxin (LANOXIN) 0.125 MG tablet, Take 0.0625 mg by mouth daily., Disp: , Rfl:    famotidine (PEPCID) 20 MG tablet, Take 20 mg by mouth at bedtime., Disp: , Rfl:    ferrous sulfate 325 (65 FE) MG tablet, Take 1 tablet (325 mg total) by mouth every Monday, Wednesday, and Friday. With supper, Disp: , Rfl:    furosemide (LASIX) 20 MG tablet, Take 1 tablet (20 mg total) by mouth daily. (Patient taking differently: Take 20 mg by mouth daily as needed. 3 lb wt gain overnight.), Disp: 30  tablet, Rfl: 0   metoprolol tartrate (LOPRESSOR) 25 MG tablet, Take 12.5 mg by mouth 2 (two) times daily., Disp: , Rfl:    Multiple Vitamin (MULTIVITAMIN) tablet, Take 1 tablet by mouth daily., Disp: , Rfl:    pantoprazole (PROTONIX) 40 MG tablet, Take 1 tablet (40 mg total) by mouth 2 (two) times daily., Disp: 60 tablet, Rfl: 1   polyethylene glycol (MIRALAX / GLYCOLAX) 17 g packet, Take 17 g by mouth daily as needed for mild constipation., Disp: 14 each, Rfl: 0   ranolazine (RANEXA) 1000 MG SR tablet, Take 500 mg by mouth 2 (two) times daily. , Disp: , Rfl:    sucralfate (CARAFATE) 1 G tablet, Take 1 g by mouth 2 (two) times daily. , Disp: , Rfl:     tamsulosin (FLOMAX) 0.4 MG CAPS capsule, Take 0.4 mg by mouth daily after supper. , Disp: , Rfl:    vitamin B-12 (CYANOCOBALAMIN) 500 MCG tablet, Take 500 mcg by mouth daily., Disp: , Rfl:    Exam: Current vital signs: BP 127/67 (BP Location: Left Arm)   Pulse 66   Temp 97.7 F (36.5 C) (Oral)   Resp 18   Ht 6\' 4"  (1.93 m)   Wt 82.1 kg   SpO2 96%   BMI 22.03 kg/m  Vital signs in last 24 hours: Temp:  [97.7 F (36.5 C)] 97.7 F (36.5 C) (07/12 0809) Pulse Rate:  [66] 66 (07/12 0809) Resp:  [18] 18 (07/12 0809) BP: (127)/(67) 127/67 (07/12 0809) SpO2:  [96 %] 96 % (07/12 0809) Weight:  [82.1 kg] 82.1 kg (07/12 0809) General: Awake alert in no distress HEENT: Normocephalic, atraumatic, dry oral mucous membranes, no lymphadenopathy Lungs clear to auscultation without any wheezing Cardiovascular: Regular rate rhythm with no murmur or gallop Abdomen nondistended nontender soft Extremities warm well perfused with left lower extremity with 1+ pitting edema and right lower extremity with trace pedal edema. Neurological exam Is awake alert oriented x3 Her speech is mildly dysarthric but that is likely due to his poor dentition and according to family baseline. No evidence of aphasia Cranial nerves: Pupils pinpoint and equal round with sluggish reaction to light, extraocular movements appear intact, visual acuity does not appear diminished although he does complain of some blurred vision to the right, visual fields full, facial sensation intact, face symmetric, auditory acuity diminished bilaterally, shoulder shrug intact, tongue and palate midline. Motor examination with no drift in any of the 4 extremities but the right lower extremity strength is somewhat limited by pain. Sensation intact light touch without extinction Coordination with no dysmetria Gait testing deferred at this time NIH stroke scale-0   Labs I have reviewed labs in epic and the results pertinent to this  consultation are:   CBC    Component Value Date/Time   WBC 6.3 11/22/2020 0807   RBC 3.02 (L) 11/22/2020 0807   HGB 9.9 (L) 11/22/2020 0807   HGB 10.2 (L) 10/23/2012 0504   HCT 30.5 (L) 11/22/2020 0807   HCT 29.9 (L) 10/23/2012 0504   PLT 130 (L) 11/22/2020 0807   PLT 253 10/23/2012 0504   MCV 101.0 (H) 11/22/2020 0807   MCV 92 10/23/2012 0504   MCH 32.8 11/22/2020 0807   MCHC 32.5 11/22/2020 0807   RDW 19.9 (H) 11/22/2020 0807   RDW 14.3 10/23/2012 0504   LYMPHSABS 2.3 11/08/2020 1347   LYMPHSABS 1.5 10/23/2012 0504   MONOABS 2.4 (H) 11/08/2020 1347   MONOABS 1.8 (H) 10/23/2012 8676  EOSABS 0.1 11/08/2020 1347   EOSABS 0.1 10/23/2012 0504   BASOSABS 0.0 11/08/2020 1347   BASOSABS 0.1 10/23/2012 0504    CMP     Component Value Date/Time   NA 135 11/22/2020 0807   NA 137 10/23/2012 0504   K 4.6 11/22/2020 0807   K 4.0 10/23/2012 0504   CL 103 11/22/2020 0807   CL 104 10/23/2012 0504   CO2 21 (L) 11/22/2020 0807   CO2 28 10/23/2012 0504   GLUCOSE 95 11/22/2020 0807   GLUCOSE 95 10/23/2012 0504   BUN 44 (H) 11/22/2020 0807   BUN 13 10/23/2012 0504   CREATININE 2.21 (H) 11/22/2020 0807   CREATININE 1.29 10/23/2012 0504   CALCIUM 9.1 11/22/2020 0807   CALCIUM 8.6 10/23/2012 0504   PROT 7.8 11/22/2020 0807   ALBUMIN 4.6 11/22/2020 0807   AST 21 11/22/2020 0807   ALT 14 11/22/2020 0807   ALKPHOS 63 11/22/2020 0807   BILITOT 1.6 (H) 11/22/2020 0807   GFRNONAA 28 (L) 11/22/2020 0807   GFRNONAA 52 (L) 10/23/2012 0504   GFRAA 45 (L) 01/07/2020 0916   GFRAA >60 10/23/2012 0504    Imaging I have reviewed the images obtained: CT-scan of the brain-noncontrast-no acute changes.  Aspects 10.  Small chronic microvascular ischemic changes as well as suspected prior lacunar infarcts in the left basal ganglia and the right posterior limb of internal capsule likely the sequela of prior lacunar infarctions and similar when compared to the prior MRI.  Sequela of prior left  occipital hemorrhage and suspected amyloid angiopathy was better characterized on prior MRI.  MRI examination of the brain-2021 [employer] focus of left occipital intraparenchymal hemorrhage likely subacute.  Sequela of cerebral amyloid Angiopathy.  Mild cerebral atrophy and moderate chronic microvascular ischemic changes.   Assessment: 87 year old man with above past medical history presenting for evaluation of right neck pain and vision changes in the right eye. On examination, unable to perform a detailed funduscopic examination.  Visual acuity seems preserved as well as no visual field deficits.  No sensorimotor deficits noted.  No abnormalities on cerebellar exam.  NIH stroke scale 0. Given history of cerebral amyloid angiopathy, low NIH stroke scale, would not recommend IV tPA at this time. Will recommend further imaging-MRI brain without contrast and MRA head and neck without contrast to rule out stroke and dissection.  Symptoms likely secondary to amyloid spells in the presence of cerebral amyloid angiopathy as well as could be sequelae of the prior left occipital hemorrhage causing electrographic abnormalities manifesting as visual disturbances-although he reports in the right eye but within the right visual field-difficult to ascertain.  Recommendations: MRI brain without contrast MRA head and neck without contrast-did not do CTA head and neck due to deranged renal function in the past.  Today's labs also reveal creatinine of 2.21 and BUN of 44 with a GFR of 28. If the imaging studies are unremarkable, no further acute neurological intervention inpatient and can follow-up with outpatient neurology. If the imaging studies are abnormal, further decision based on those results. He will irrespective need outpatient follow-up with neurology-family would prefer neurological care locally-should be given a referral for [facility] neurology-Dr. Manuella Ghazi or Dr. Melrose Nakayama  Plan discussed with Dr.  Corky Downs, bedside RN Amber and stroke response RN.   Addendum: 1:30 PM MRI brain completed and personally reviewed-no  evidence of acute stroke. MRA head and neck motion riddled but with no acute abnormality. No further inpatient neurological work-up recommended at this time Needs  outpatient follow-up for possible consideration of amyloid spells as the etiology of these neurological events.  Will benefit from an outpatient EEG. Plan relayed to Dr. Corky Downs epic secure chat  -- Amie Portland, MD Neurologist [facility] Pager: [phone]

## 2020-11-22 NOTE — ED Notes (Signed)
Called [facility] to [facility] spoke with [facility]

## 2020-11-24 ENCOUNTER — Telehealth: Payer: Self-pay | Admitting: *Deleted

## 2020-11-24 DIAGNOSIS — I5022 Chronic systolic (congestive) heart failure: Secondary | ICD-10-CM | POA: Diagnosis not present

## 2020-11-24 DIAGNOSIS — D469 Myelodysplastic syndrome, unspecified: Secondary | ICD-10-CM | POA: Diagnosis not present

## 2020-11-24 DIAGNOSIS — I680 Cerebral amyloid angiopathy: Secondary | ICD-10-CM | POA: Diagnosis not present

## 2020-11-24 DIAGNOSIS — L97521 Non-pressure chronic ulcer of other part of left foot limited to breakdown of skin: Secondary | ICD-10-CM | POA: Diagnosis not present

## 2020-11-24 DIAGNOSIS — I420 Dilated cardiomyopathy: Secondary | ICD-10-CM | POA: Diagnosis not present

## 2020-11-24 DIAGNOSIS — Z0000 Encounter for general adult medical examination without abnormal findings: Secondary | ICD-10-CM | POA: Diagnosis not present

## 2020-11-24 DIAGNOSIS — G609 Hereditary and idiopathic neuropathy, unspecified: Secondary | ICD-10-CM | POA: Diagnosis not present

## 2020-11-24 DIAGNOSIS — R531 Weakness: Secondary | ICD-10-CM | POA: Diagnosis not present

## 2020-11-24 DIAGNOSIS — E854 Organ-limited amyloidosis: Secondary | ICD-10-CM | POA: Diagnosis not present

## 2020-11-24 DIAGNOSIS — H538 Other visual disturbances: Secondary | ICD-10-CM | POA: Diagnosis not present

## 2020-11-24 DIAGNOSIS — E782 Mixed hyperlipidemia: Secondary | ICD-10-CM | POA: Diagnosis not present

## 2020-11-24 NOTE — Telephone Encounter (Signed)
Per Shirlean Mylar, patient's daughter called and requested pt's aranesp to be r/s due to recent ER visit. Ok to use the labs for this aranesp injection. Hgb 9.9. on 11/22/20  Colette,Robin - pls arrange for aranesp injection only this week. Please keep all other apts the same.

## 2020-11-25 ENCOUNTER — Inpatient Hospital Stay: Payer: PPO | Attending: Internal Medicine

## 2020-11-25 ENCOUNTER — Other Ambulatory Visit: Payer: Self-pay

## 2020-11-25 VITALS — BP 119/67 | HR 61 | Temp 97.2°F | Resp 18

## 2020-11-25 DIAGNOSIS — I482 Chronic atrial fibrillation, unspecified: Secondary | ICD-10-CM | POA: Diagnosis present

## 2020-11-25 DIAGNOSIS — D631 Anemia in chronic kidney disease: Secondary | ICD-10-CM

## 2020-11-25 DIAGNOSIS — I5020 Unspecified systolic (congestive) heart failure: Secondary | ICD-10-CM | POA: Diagnosis not present

## 2020-11-25 DIAGNOSIS — I2510 Atherosclerotic heart disease of native coronary artery without angina pectoris: Secondary | ICD-10-CM | POA: Diagnosis not present

## 2020-11-25 DIAGNOSIS — R0600 Dyspnea, unspecified: Secondary | ICD-10-CM | POA: Diagnosis not present

## 2020-11-25 DIAGNOSIS — E871 Hypo-osmolality and hyponatremia: Secondary | ICD-10-CM | POA: Diagnosis not present

## 2020-11-25 DIAGNOSIS — I517 Cardiomegaly: Secondary | ICD-10-CM | POA: Diagnosis not present

## 2020-11-25 DIAGNOSIS — I252 Old myocardial infarction: Secondary | ICD-10-CM | POA: Diagnosis not present

## 2020-11-25 DIAGNOSIS — I898 Other specified noninfective disorders of lymphatic vessels and lymph nodes: Secondary | ICD-10-CM | POA: Diagnosis not present

## 2020-11-25 DIAGNOSIS — D469 Myelodysplastic syndrome, unspecified: Secondary | ICD-10-CM | POA: Diagnosis present

## 2020-11-25 DIAGNOSIS — I680 Cerebral amyloid angiopathy: Secondary | ICD-10-CM | POA: Diagnosis present

## 2020-11-25 DIAGNOSIS — N400 Enlarged prostate without lower urinary tract symptoms: Secondary | ICD-10-CM | POA: Diagnosis present

## 2020-11-25 DIAGNOSIS — M170 Bilateral primary osteoarthritis of knee: Secondary | ICD-10-CM | POA: Diagnosis present

## 2020-11-25 DIAGNOSIS — N184 Chronic kidney disease, stage 4 (severe): Secondary | ICD-10-CM | POA: Diagnosis present

## 2020-11-25 DIAGNOSIS — N1832 Chronic kidney disease, stage 3b: Secondary | ICD-10-CM | POA: Diagnosis not present

## 2020-11-25 DIAGNOSIS — I25119 Atherosclerotic heart disease of native coronary artery with unspecified angina pectoris: Secondary | ICD-10-CM | POA: Diagnosis present

## 2020-11-25 DIAGNOSIS — I5043 Acute on chronic combined systolic (congestive) and diastolic (congestive) heart failure: Secondary | ICD-10-CM | POA: Diagnosis present

## 2020-11-25 DIAGNOSIS — I110 Hypertensive heart disease with heart failure: Secondary | ICD-10-CM | POA: Diagnosis not present

## 2020-11-25 DIAGNOSIS — Z8249 Family history of ischemic heart disease and other diseases of the circulatory system: Secondary | ICD-10-CM | POA: Diagnosis not present

## 2020-11-25 DIAGNOSIS — J9601 Acute respiratory failure with hypoxia: Secondary | ICD-10-CM | POA: Diagnosis not present

## 2020-11-25 DIAGNOSIS — E854 Organ-limited amyloidosis: Secondary | ICD-10-CM | POA: Diagnosis present

## 2020-11-25 DIAGNOSIS — D4620 Refractory anemia with excess of blasts, unspecified: Secondary | ICD-10-CM | POA: Diagnosis not present

## 2020-11-25 DIAGNOSIS — R0602 Shortness of breath: Secondary | ICD-10-CM | POA: Diagnosis present

## 2020-11-25 DIAGNOSIS — R609 Edema, unspecified: Secondary | ICD-10-CM | POA: Diagnosis not present

## 2020-11-25 DIAGNOSIS — I5023 Acute on chronic systolic (congestive) heart failure: Secondary | ICD-10-CM | POA: Diagnosis not present

## 2020-11-25 DIAGNOSIS — J90 Pleural effusion, not elsewhere classified: Secondary | ICD-10-CM | POA: Diagnosis not present

## 2020-11-25 DIAGNOSIS — D696 Thrombocytopenia, unspecified: Secondary | ICD-10-CM | POA: Diagnosis not present

## 2020-11-25 DIAGNOSIS — I509 Heart failure, unspecified: Secondary | ICD-10-CM | POA: Diagnosis not present

## 2020-11-25 DIAGNOSIS — Z951 Presence of aortocoronary bypass graft: Secondary | ICD-10-CM | POA: Diagnosis not present

## 2020-11-25 DIAGNOSIS — E875 Hyperkalemia: Secondary | ICD-10-CM | POA: Diagnosis not present

## 2020-11-25 DIAGNOSIS — I255 Ischemic cardiomyopathy: Secondary | ICD-10-CM | POA: Diagnosis present

## 2020-11-25 DIAGNOSIS — Z7189 Other specified counseling: Secondary | ICD-10-CM | POA: Diagnosis not present

## 2020-11-25 DIAGNOSIS — J9811 Atelectasis: Secondary | ICD-10-CM | POA: Diagnosis not present

## 2020-11-25 DIAGNOSIS — I10 Essential (primary) hypertension: Secondary | ICD-10-CM | POA: Diagnosis not present

## 2020-11-25 DIAGNOSIS — Z20822 Contact with and (suspected) exposure to covid-19: Secondary | ICD-10-CM | POA: Diagnosis present

## 2020-11-25 DIAGNOSIS — I130 Hypertensive heart and chronic kidney disease with heart failure and stage 1 through stage 4 chronic kidney disease, or unspecified chronic kidney disease: Secondary | ICD-10-CM | POA: Diagnosis present

## 2020-11-25 DIAGNOSIS — E8770 Fluid overload, unspecified: Secondary | ICD-10-CM | POA: Diagnosis not present

## 2020-11-25 DIAGNOSIS — J449 Chronic obstructive pulmonary disease, unspecified: Secondary | ICD-10-CM | POA: Diagnosis present

## 2020-11-25 DIAGNOSIS — K2100 Gastro-esophageal reflux disease with esophagitis, without bleeding: Secondary | ICD-10-CM | POA: Diagnosis present

## 2020-11-25 DIAGNOSIS — J811 Chronic pulmonary edema: Secondary | ICD-10-CM | POA: Diagnosis not present

## 2020-11-25 DIAGNOSIS — E785 Hyperlipidemia, unspecified: Secondary | ICD-10-CM | POA: Diagnosis present

## 2020-11-25 DIAGNOSIS — D6959 Other secondary thrombocytopenia: Secondary | ICD-10-CM | POA: Diagnosis present

## 2020-11-25 DIAGNOSIS — Z66 Do not resuscitate: Secondary | ICD-10-CM | POA: Diagnosis not present

## 2020-11-25 DIAGNOSIS — Z48813 Encounter for surgical aftercare following surgery on the respiratory system: Secondary | ICD-10-CM | POA: Diagnosis not present

## 2020-11-25 DIAGNOSIS — M4699 Unspecified inflammatory spondylopathy, multiple sites in spine: Secondary | ICD-10-CM | POA: Diagnosis not present

## 2020-11-25 DIAGNOSIS — N179 Acute kidney failure, unspecified: Secondary | ICD-10-CM | POA: Diagnosis present

## 2020-11-25 DIAGNOSIS — N281 Cyst of kidney, acquired: Secondary | ICD-10-CM | POA: Diagnosis not present

## 2020-11-25 DIAGNOSIS — N200 Calculus of kidney: Secondary | ICD-10-CM | POA: Diagnosis not present

## 2020-11-25 DIAGNOSIS — D500 Iron deficiency anemia secondary to blood loss (chronic): Secondary | ICD-10-CM

## 2020-11-25 MED ORDER — DARBEPOETIN ALFA 150 MCG/0.3ML IJ SOSY
300.0000 ug | PREFILLED_SYRINGE | Freq: Once | INTRAMUSCULAR | Status: AC
  Administered 2020-11-25: 300 ug via SUBCUTANEOUS

## 2020-11-25 MED ORDER — DARBEPOETIN ALFA 300 MCG/0.6ML IJ SOSY: 300.0000 ug | PREFILLED_SYRINGE | Freq: Once | INTRAMUSCULAR | Status: DC

## 2020-11-26 ENCOUNTER — Other Ambulatory Visit: Payer: Self-pay

## 2020-11-26 ENCOUNTER — Inpatient Hospital Stay
Admission: EM | Admit: 2020-11-26 | Discharge: 2020-12-07 | DRG: 291 | Disposition: A | Discharge status: Home Health | Payer: PPO | Attending: Internal Medicine | Admitting: Internal Medicine

## 2020-11-26 ENCOUNTER — Encounter: Payer: Self-pay | Admitting: Emergency Medicine

## 2020-11-26 ENCOUNTER — Emergency Department: Payer: PPO

## 2020-11-26 DIAGNOSIS — J90 Pleural effusion, not elsewhere classified: Secondary | ICD-10-CM | POA: Diagnosis not present

## 2020-11-26 DIAGNOSIS — R0602 Shortness of breath: Secondary | ICD-10-CM

## 2020-11-26 DIAGNOSIS — D469 Myelodysplastic syndrome, unspecified: Secondary | ICD-10-CM | POA: Diagnosis present

## 2020-11-26 DIAGNOSIS — I5043 Acute on chronic combined systolic (congestive) and diastolic (congestive) heart failure: Secondary | ICD-10-CM | POA: Diagnosis present

## 2020-11-26 DIAGNOSIS — D4620 Refractory anemia with excess of blasts, unspecified: Secondary | ICD-10-CM | POA: Diagnosis not present

## 2020-11-26 DIAGNOSIS — I482 Chronic atrial fibrillation, unspecified: Secondary | ICD-10-CM | POA: Diagnosis present

## 2020-11-26 DIAGNOSIS — D6959 Other secondary thrombocytopenia: Secondary | ICD-10-CM | POA: Diagnosis present

## 2020-11-26 DIAGNOSIS — K2100 Gastro-esophageal reflux disease with esophagitis, without bleeding: Secondary | ICD-10-CM | POA: Diagnosis present

## 2020-11-26 DIAGNOSIS — R0609 Other forms of dyspnea: Secondary | ICD-10-CM | POA: Diagnosis present

## 2020-11-26 DIAGNOSIS — N179 Acute kidney failure, unspecified: Secondary | ICD-10-CM | POA: Diagnosis present

## 2020-11-26 DIAGNOSIS — I255 Ischemic cardiomyopathy: Secondary | ICD-10-CM | POA: Diagnosis present

## 2020-11-26 DIAGNOSIS — R0600 Dyspnea, unspecified: Secondary | ICD-10-CM | POA: Diagnosis not present

## 2020-11-26 DIAGNOSIS — J449 Chronic obstructive pulmonary disease, unspecified: Secondary | ICD-10-CM | POA: Diagnosis present

## 2020-11-26 DIAGNOSIS — E854 Organ-limited amyloidosis: Secondary | ICD-10-CM | POA: Diagnosis present

## 2020-11-26 DIAGNOSIS — D696 Thrombocytopenia, unspecified: Secondary | ICD-10-CM | POA: Diagnosis present

## 2020-11-26 DIAGNOSIS — Z8249 Family history of ischemic heart disease and other diseases of the circulatory system: Secondary | ICD-10-CM

## 2020-11-26 DIAGNOSIS — J9811 Atelectasis: Secondary | ICD-10-CM | POA: Diagnosis not present

## 2020-11-26 DIAGNOSIS — N184 Chronic kidney disease, stage 4 (severe): Secondary | ICD-10-CM | POA: Diagnosis present

## 2020-11-26 DIAGNOSIS — I2510 Atherosclerotic heart disease of native coronary artery without angina pectoris: Secondary | ICD-10-CM | POA: Diagnosis present

## 2020-11-26 DIAGNOSIS — I509 Heart failure, unspecified: Secondary | ICD-10-CM | POA: Diagnosis not present

## 2020-11-26 DIAGNOSIS — I480 Paroxysmal atrial fibrillation: Secondary | ICD-10-CM | POA: Diagnosis not present

## 2020-11-26 DIAGNOSIS — E785 Hyperlipidemia, unspecified: Secondary | ICD-10-CM | POA: Diagnosis present

## 2020-11-26 DIAGNOSIS — I680 Cerebral amyloid angiopathy: Secondary | ICD-10-CM | POA: Diagnosis present

## 2020-11-26 DIAGNOSIS — I25119 Atherosclerotic heart disease of native coronary artery with unspecified angina pectoris: Secondary | ICD-10-CM | POA: Diagnosis present

## 2020-11-26 DIAGNOSIS — I10 Essential (primary) hypertension: Secondary | ICD-10-CM | POA: Diagnosis present

## 2020-11-26 DIAGNOSIS — Z66 Do not resuscitate: Secondary | ICD-10-CM | POA: Diagnosis not present

## 2020-11-26 DIAGNOSIS — E875 Hyperkalemia: Secondary | ICD-10-CM | POA: Diagnosis present

## 2020-11-26 DIAGNOSIS — N1832 Chronic kidney disease, stage 3b: Secondary | ICD-10-CM

## 2020-11-26 DIAGNOSIS — Z800 Family history of malignant neoplasm of digestive organs: Secondary | ICD-10-CM

## 2020-11-26 DIAGNOSIS — I252 Old myocardial infarction: Secondary | ICD-10-CM

## 2020-11-26 DIAGNOSIS — Z951 Presence of aortocoronary bypass graft: Secondary | ICD-10-CM

## 2020-11-26 DIAGNOSIS — Z881 Allergy status to other antibiotic agents status: Secondary | ICD-10-CM

## 2020-11-26 DIAGNOSIS — Z79899 Other long term (current) drug therapy: Secondary | ICD-10-CM

## 2020-11-26 DIAGNOSIS — D631 Anemia in chronic kidney disease: Secondary | ICD-10-CM | POA: Diagnosis present

## 2020-11-26 DIAGNOSIS — J9601 Acute respiratory failure with hypoxia: Secondary | ICD-10-CM | POA: Diagnosis not present

## 2020-11-26 DIAGNOSIS — T501X5A Adverse effect of loop [high-ceiling] diuretics, initial encounter: Secondary | ICD-10-CM | POA: Diagnosis not present

## 2020-11-26 DIAGNOSIS — I110 Hypertensive heart disease with heart failure: Secondary | ICD-10-CM | POA: Diagnosis not present

## 2020-11-26 DIAGNOSIS — N400 Enlarged prostate without lower urinary tract symptoms: Secondary | ICD-10-CM | POA: Diagnosis not present

## 2020-11-26 DIAGNOSIS — I130 Hypertensive heart and chronic kidney disease with heart failure and stage 1 through stage 4 chronic kidney disease, or unspecified chronic kidney disease: Principal | ICD-10-CM | POA: Diagnosis present

## 2020-11-26 DIAGNOSIS — Z87891 Personal history of nicotine dependence: Secondary | ICD-10-CM

## 2020-11-26 DIAGNOSIS — Z888 Allergy status to other drugs, medicaments and biological substances status: Secondary | ICD-10-CM

## 2020-11-26 DIAGNOSIS — M170 Bilateral primary osteoarthritis of knee: Secondary | ICD-10-CM | POA: Diagnosis present

## 2020-11-26 DIAGNOSIS — Z20822 Contact with and (suspected) exposure to covid-19: Secondary | ICD-10-CM | POA: Diagnosis present

## 2020-11-26 DIAGNOSIS — E871 Hypo-osmolality and hyponatremia: Secondary | ICD-10-CM | POA: Diagnosis not present

## 2020-11-26 LAB — DIGOXIN LEVEL: Digoxin Level: 1.1 ng/mL (ref 0.8–2.0)

## 2020-11-26 LAB — RESP PANEL BY RT-PCR (FLU A&B, COVID) ARPGX2
Influenza A by PCR: NEGATIVE
Influenza B by PCR: NEGATIVE
SARS Coronavirus 2 by RT PCR: NEGATIVE

## 2020-11-26 LAB — HEPATIC FUNCTION PANEL
ALT: 9 U/L (ref 0–44)
AST: 35 U/L (ref 15–41)
Albumin: 4.3 g/dL (ref 3.5–5.0)
Alkaline Phosphatase: 54 U/L (ref 38–126)
Bilirubin, Direct: 0.5 mg/dL — ABNORMAL HIGH (ref 0.0–0.2)
Indirect Bilirubin: 1.1 mg/dL — ABNORMAL HIGH (ref 0.3–0.9)
Total Bilirubin: 1.6 mg/dL — ABNORMAL HIGH (ref 0.3–1.2)
Total Protein: 7.6 g/dL (ref 6.5–8.1)

## 2020-11-26 LAB — CBC
HCT: 27.9 % — ABNORMAL LOW (ref 39.0–52.0)
Hemoglobin: 9 g/dL — ABNORMAL LOW (ref 13.0–17.0)
MCH: 32.8 pg (ref 26.0–34.0)
MCHC: 32.3 g/dL (ref 30.0–36.0)
MCV: 101.8 fL — ABNORMAL HIGH (ref 80.0–100.0)
Platelets: 110 10*3/uL — ABNORMAL LOW (ref 150–400)
RBC: 2.74 MIL/uL — ABNORMAL LOW (ref 4.22–5.81)
RDW: 19.5 % — ABNORMAL HIGH (ref 11.5–15.5)
WBC: 6.6 10*3/uL (ref 4.0–10.5)
nRBC: 0 % (ref 0.0–0.2)

## 2020-11-26 LAB — BASIC METABOLIC PANEL
Anion gap: 8 (ref 5–15)
BUN: 45 mg/dL — ABNORMAL HIGH (ref 8–23)
CO2: 21 mmol/L — ABNORMAL LOW (ref 22–32)
Calcium: 9.3 mg/dL (ref 8.9–10.3)
Chloride: 107 mmol/L (ref 98–111)
Creatinine, Ser: 2.02 mg/dL — ABNORMAL HIGH (ref 0.61–1.24)
GFR, Estimated: 31 mL/min — ABNORMAL LOW (ref >60)
Glucose, Bld: 101 mg/dL — ABNORMAL HIGH (ref 70–99)
Potassium: 5.5 mmol/L — ABNORMAL HIGH (ref 3.5–5.1)
Sodium: 136 mmol/L (ref 135–145)

## 2020-11-26 LAB — MAGNESIUM: Magnesium: 1.8 mg/dL (ref 1.7–2.4)

## 2020-11-26 LAB — BRAIN NATRIURETIC PEPTIDE: B Natriuretic Peptide: 1628.7 pg/mL — ABNORMAL HIGH (ref 0.0–100.0)

## 2020-11-26 LAB — TROPONIN I (HIGH SENSITIVITY)
Troponin I (High Sensitivity): 20 ng/L — ABNORMAL HIGH (ref <18)
Troponin I (High Sensitivity): 22 ng/L — ABNORMAL HIGH (ref <18)

## 2020-11-26 MED ORDER — ACETAMINOPHEN 650 MG RE SUPP
650.0000 mg | Freq: Four times a day (QID) | RECTAL | Status: DC | PRN
Start: 2020-11-26 — End: 2020-12-07

## 2020-11-26 MED ORDER — CEPHALEXIN 500 MG PO CAPS
500.0000 mg | ORAL_CAPSULE | Freq: Three times a day (TID) | ORAL | Status: DC
  Administered 2020-11-26 – 2020-11-28 (×5): 500 mg via ORAL
  Filled 2020-11-26 (×5): qty 1

## 2020-11-26 MED ORDER — RANOLAZINE ER 500 MG PO TB12
500.0000 mg | ORAL_TABLET | Freq: Two times a day (BID) | ORAL | Status: DC
  Administered 2020-11-26 – 2020-12-05 (×18): 500 mg via ORAL
  Filled 2020-11-26 (×19): qty 1

## 2020-11-26 MED ORDER — POLYETHYLENE GLYCOL 3350 17 G PO PACK: 17.0000 g | PACK | Freq: Every day | ORAL | Status: DC | PRN

## 2020-11-26 MED ORDER — METOPROLOL TARTRATE 25 MG PO TABS
12.5000 mg | ORAL_TABLET | Freq: Two times a day (BID) | ORAL | Status: DC
  Administered 2020-11-26 – 2020-12-07 (×15): 12.5 mg via ORAL
  Filled 2020-11-26 (×19): qty 1

## 2020-11-26 MED ORDER — ACETAMINOPHEN 325 MG PO TABS
650.0000 mg | ORAL_TABLET | Freq: Four times a day (QID) | ORAL | Status: DC | PRN
Start: 2020-11-26 — End: 2020-12-07
  Administered 2020-11-29 – 2020-12-05 (×4): 650 mg via ORAL
  Filled 2020-11-26 (×4): qty 2

## 2020-11-26 MED ORDER — ATORVASTATIN CALCIUM 20 MG PO TABS
40.0000 mg | ORAL_TABLET | Freq: Every day | ORAL | Status: DC
  Administered 2020-11-26 – 2020-12-06 (×11): 40 mg via ORAL
  Filled 2020-11-26 (×11): qty 2

## 2020-11-26 MED ORDER — DIGOXIN 125 MCG PO TABS
0.0625 mg | ORAL_TABLET | Freq: Every day | ORAL | Status: DC
  Administered 2020-11-27 – 2020-12-07 (×11): 0.0625 mg via ORAL
  Filled 2020-11-26 (×11): qty 0.5

## 2020-11-26 MED ORDER — SODIUM CHLORIDE 0.9% FLUSH
3.0000 mL | Freq: Two times a day (BID) | INTRAVENOUS | Status: DC
  Administered 2020-11-26 – 2020-12-07 (×18): 3 mL via INTRAVENOUS

## 2020-11-26 MED ORDER — PATIROMER SORBITEX CALCIUM 8.4 G PO PACK
16.8000 g | PACK | Freq: Every day | ORAL | Status: DC
  Filled 2020-11-26: qty 2

## 2020-11-26 MED ORDER — TAMSULOSIN HCL 0.4 MG PO CAPS
0.4000 mg | ORAL_CAPSULE | Freq: Every day | ORAL | Status: DC
  Administered 2020-11-26 – 2020-12-06 (×11): 0.4 mg via ORAL
  Filled 2020-11-26 (×11): qty 1

## 2020-11-26 MED ORDER — FUROSEMIDE 10 MG/ML IJ SOLN
40.0000 mg | Freq: Two times a day (BID) | INTRAMUSCULAR | Status: DC
  Administered 2020-11-27 – 2020-11-28 (×3): 40 mg via INTRAVENOUS
  Filled 2020-11-26 (×3): qty 4

## 2020-11-26 MED ORDER — SUCRALFATE 1 G PO TABS
1.0000 g | ORAL_TABLET | Freq: Two times a day (BID) | ORAL | Status: DC
  Administered 2020-11-26 – 2020-12-07 (×22): 1 g via ORAL
  Filled 2020-11-26 (×22): qty 1

## 2020-11-26 MED ORDER — ENOXAPARIN SODIUM 40 MG/0.4ML IJ SOSY
40.0000 mg | PREFILLED_SYRINGE | INTRAMUSCULAR | Status: DC
  Administered 2020-11-26: 40 mg via SUBCUTANEOUS
  Filled 2020-11-26: qty 0.4

## 2020-11-26 MED ORDER — CEPHALEXIN 500 MG PO CAPS: 500.0000 mg | ORAL_CAPSULE | Freq: Four times a day (QID) | ORAL | Status: DC

## 2020-11-26 MED ORDER — FUROSEMIDE 10 MG/ML IJ SOLN
60.0000 mg | Freq: Once | INTRAMUSCULAR | Status: AC
  Administered 2020-11-26: 60 mg via INTRAVENOUS
  Filled 2020-11-26: qty 8

## 2020-11-26 MED ORDER — FERROUS SULFATE 325 (65 FE) MG PO TABS
325.0000 mg | ORAL_TABLET | ORAL | Status: DC
Start: 2020-11-28 — End: 2020-12-07
  Administered 2020-11-28 – 2020-12-05 (×4): 325 mg via ORAL
  Filled 2020-11-26 (×4): qty 1

## 2020-11-26 NOTE — ED Triage Notes (Signed)
Family reports pt is on cephalexin 500mg  for a stye on his eye and lasix 20mg  on Thurs, Fri and Sat. They reports these meds may not be on his med list in the system

## 2020-11-26 NOTE — ED Triage Notes (Signed)
Pt reports has CHF and has gained weight even with lasix over the last few days and feels SOB and is having difficulty walking

## 2020-11-26 NOTE — H&P (Signed)
History and Physical   Andres Gutierrez PRF:[id number] DOB: Oct 10, 1930 DOA: 11/26/2020  PCP: Rusty Aus, MD   Patient coming from: Home  Chief Complaint: Shortness of breath, weight gain, edema  HPI: Andres Gutierrez is a 87 y.o. male with medical history significant of CHF, CKD 3B, A. fib, BPH, CAD status post CABG, cerebral amyloid angiopathy, GERD, hyperlipidemia, hypertension, iron deficiency anemia, myelodysplastic syndrome who presents with ongoing increased dyspnea on exertion, edema, weight gain. As above he has had weight gain, shortness of breath, edema in the setting of known heart failure.  He was seen by PCP recently and told to take p.o. Lasix daily but his symptoms have persisted despite this.  He does have some chronic dyspnea on exertion however that is worse than normal. He was started on keflex for a style in his eyelid on Wednesday. He denies fevers, chills, chest pain, abdominal pain, constipation, diarrhea, nausea, vomiting.  ED Course: Vital signs in the ED are stable.  Lab work-up shows CMP with potassium 5.5, bicarb 21, BUN 45, creatinine 2.0 up from baseline of around 1.7, T bili 1.6.  CBC showed hemoglobin stable at 9 and platelets stable at 110.  Initial troponin mildly elevated at 20 and BNP elevated at 1628.  Respiratory panel for flu and COVID-negative.  Chest x-ray showed bibasilar effusions with no pulmonary edema.  Patient given dose of IV Lasix in ED.  After this patient was ambulated and desaturated to around 90% with significant dyspnea on exertion.  Review of Systems: As per HPI otherwise all other systems reviewed and are negative.  Past Medical History:  Diagnosis Date   Acalculous cholecystitis 10/29/2015   Acute on chronic heart failure ([place]) 06/07/2019   Anemia    Anginal pain (HCC)    Anxiety    Atrial fibrillation with RVR ([place]) 12/23/2015   Cardiomyopathy ([place])    Cerebral amyloid angiopathy (HCC)    CHF (congestive heart failure) (HCC)    Chronic  kidney disease    kidney stones   Coronary artery disease    Cough    Dyspnea    GERD (gastroesophageal reflux disease)    GERD (gastroesophageal reflux disease) 12/23/2015   History of kidney stones    Hypertension    Lymphadenopathy, hilar    MDS (myelodysplastic syndrome) (HCC)    MI (myocardial infarction) ([place])    x 2   Wheezing     Past Surgical History:  Procedure Laterality Date   BRONCHIAL NEEDLE ASPIRATION BIOPSY N/A 12/31/2014   Procedure: BRONCHIAL NEEDLE ASPIRATION BIOPSIES from [place];  Surgeon: Flora Lipps, MD;  Location: [facility] ORS;  Service: Cardiopulmonary;  Laterality: N/A;   CARDIAC CATHETERIZATION N/A 12/28/2015   Procedure: Left Heart Cath and Cors/Grafts Angiography;  Surgeon: Yolonda Kida, MD;  Location: [place];  Service: Cardiovascular;  Laterality: N/A;   CATARACT EXTRACTION W/PHACO Left 08/01/2016   Procedure: CATARACT EXTRACTION PHACO AND INTRAOCULAR LENS PLACEMENT ([place]) Left;  Surgeon: Leandrew Koyanagi, MD;  Location: [place];  Service: Ophthalmology;  Laterality: Left;   CHOLECYSTECTOMY     COLON SURGERY     CORONARY ANGIOPLASTY WITH STENT PLACEMENT     CORONARY ARTERY BYPASS GRAFT     CYSTOSCOPY/URETEROSCOPY/HOLMIUM LASER/STENT PLACEMENT Left 03/18/2020   Procedure: CYSTOSCOPY/URETEROSCOPY/HOLMIUM LASER/STENT PLACEMENT;  Surgeon: Billey Co, MD;  Location: [facility] ORS;  Service: Urology;  Laterality: Left;   ENDOBRONCHIAL ULTRASOUND N/A 12/31/2014   Procedure: ENDOBRONCHIAL ULTRASOUND;  Surgeon: Flora Lipps, MD;  Location: [facility]  ORS;  Service: Cardiopulmonary;  Laterality: N/A;   ESOPHAGOGASTRODUODENOSCOPY N/A 08/15/2020   Procedure: ESOPHAGOGASTRODUODENOSCOPY (EGD);  Surgeon: Tol[place], Benay Pike, MD;  Location: [facility] ENDOSCOPY;  Service: Gastroenterology;  Laterality: N/A;   ESOPHAGOGASTRODUODENOSCOPY (EGD) WITH PROPOFOL N/A 06/20/2015   Procedure: ESOPHAGOGASTRODUODENOSCOPY (EGD) WITH PROPOFOL;  Surgeon: Lollie Sails, MD;   Location: [facility] ENDOSCOPY;  Service: Endoscopy;  Laterality: N/A;   IR CATHETER TUBE CHANGE  10/26/2019   IR CHOLANGIOGRAM EXISTING TUBE  08/13/2019    Social History  reports that he has quit smoking. His smokeless tobacco use includes chew. He reports current alcohol use. He reports that he does not use drugs.  Allergies  Allergen Reactions   Levaquin [Levofloxacin] Other (See Comments)    [name], her daughter, said Levaquin should be avoided because when patient had Levaquin a few years ago, he had "a bad reaction".  She said he could not walk and could not lift up his feet.   Escitalopram Other (See Comments)    Reaction:  Makes him feel faint, like he was going to have a heart attack.   5ht3 Receptor Antagonists Other (See Comments)   Diltiazem Rash   Diphenhydramine Hcl Other (See Comments)    Reaction:  Rash and fever a long time ago, but has had it since with no problem.   Maxidex [Dexamethasone] Other (See Comments)    Reaction:  Unknown    Prednisone Other (See Comments)    Pt states that this medication made him feel crazy.     Serotonin Other (See Comments)    Tried 2 different types, lexapro and another one.  Reaction:  Made him feel like he was having a heart attack.    Vytorin [Ezetimibe-Simvastatin] Other (See Comments)    Reaction:  Unknown    Zocor [Simvastatin] Other (See Comments)    Reaction:  Unknown     Family History  Problem Relation Age of Onset   CAD Mother    Colon cancer Father   Reviewed on admission  Prior to Admission medications   Medication Sig Start Date End Date Taking? Authorizing Provider  atorvastatin (LIPITOR) 40 MG tablet Take 1 tablet (40 mg total) by mouth daily with supper. 02/27/20   Enzo Bi, MD  digoxin (LANOXIN) 0.125 MG tablet Take 0.0625 mg by mouth daily.    [provider]  famotidine (PEPCID) 20 MG tablet Take 20 mg by mouth at bedtime. 02/04/19   [provider]  ferrous sulfate 325 (65 FE) MG tablet Take 1  tablet (325 mg total) by mouth every Monday, Wednesday, and Friday. With supper 02/29/20   Enzo Bi, MD  furosemide (LASIX) 20 MG tablet Take 1 tablet (20 mg total) by mouth daily. Patient taking differently: Take 10 mg by mouth daily as needed. 3 lb wt gain overnight. 10/04/20   Lorella Nimrod, MD  metoprolol tartrate (LOPRESSOR) 25 MG tablet Take 12.5 mg by mouth 2 (two) times daily.    [provider]  Multiple Vitamin (MULTIVITAMIN) tablet Take 1 tablet by mouth daily.    [provider]  pantoprazole (PROTONIX) 40 MG tablet Take 1 tablet (40 mg total) by mouth 2 (two) times daily. 08/17/20   Lorella Nimrod, MD  polyethylene glycol (MIRALAX / GLYCOLAX) 17 g packet Take 17 g by mouth daily as needed for mild constipation. 08/17/20   Lorella Nimrod, MD  ranolazine (RANEXA) 1000 MG SR tablet Take 500 mg by mouth 2 (two) times daily.  01/16/19   [provider]  sucralfate (CARAFATE) 1 G tablet Take 1 g by mouth 2 (two) times daily.     [provider]  tamsulosin (FLOMAX) 0.4 MG CAPS capsule Take 0.4 mg by mouth daily after supper.     [provider]  vitamin B-12 (CYANOCOBALAMIN) 500 MCG tablet Take 500 mcg by mouth daily.    [provider]    Physical Exam: Vitals:   11/26/20 1514 11/26/20 1533 11/26/20 1538 11/26/20 1730  BP:  117/69  124/65  Pulse:  62  64  Resp:  20  (!) 21  Temp:   97.6 F (36.4 C)   TempSrc:   Oral   SpO2:  98%  93%  Weight: 83.9 kg     Height: 6\' 4"  (1.93 m)      Physical Exam Constitutional:      General: He is not in acute distress.    Appearance: Normal appearance.  HENT:     Head: Normocephalic and atraumatic.     Mouth/Throat:     Mouth: Mucous membranes are moist.     Pharynx: Oropharynx is clear.  Eyes:     Extraocular Movements: Extraocular movements intact.     Pupils: Pupils are equal, round, and reactive to light.  Cardiovascular:     Rate and Rhythm: Normal rate and regular rhythm.     Pulses:  Normal pulses.     Heart sounds: Normal heart sounds.  Pulmonary:     Effort: Pulmonary effort is normal. No respiratory distress.     Breath sounds: Normal breath sounds.  Abdominal:     General: Bowel sounds are normal. There is no distension.     Palpations: Abdomen is soft.     Tenderness: There is no abdominal tenderness.  Musculoskeletal:        General: No swelling or deformity.     Right lower leg: Edema present.     Left lower leg: Edema present.  Skin:    General: Skin is warm and dry.  Neurological:     General: No focal deficit present.     Mental Status: Mental status is at baseline.   Labs on Admission: I have personally reviewed following labs and imaging studies  CBC: Recent Labs  Lab 11/22/20 0807 11/26/20 1645  WBC 6.3 6.6  HGB 9.9* 9.0*  HCT 30.5* 27.9*  MCV 101.0* 101.8*  PLT 130* 110*    Basic Metabolic Panel: Recent Labs  Lab 11/22/20 0807 11/26/20 1536  NA 135 136  K 4.6 5.5*  CL 103 107  CO2 21* 21*  GLUCOSE 95 101*  BUN 44* 45*  CREATININE 2.21* 2.02*  CALCIUM 9.1 9.3    GFR: Estimated Creatinine Clearance: 30.6 mL/min (A) (by C-G formula based on SCr of 2.02 mg/dL (H)).  Liver Function Tests: Recent Labs  Lab 11/22/20 0807 11/26/20 1536  AST 21 35  ALT 14 9  ALKPHOS 63 54  BILITOT 1.6* 1.6*  PROT 7.8 7.6  ALBUMIN 4.6 4.3    Urine analysis:    Component Value Date/Time   COLORURINE YELLOW (A) 10/03/2020 1306   APPEARANCEUR CLEAR (A) 10/03/2020 1306   LABSPEC 1.014 10/03/2020 1306   PHURINE 5.0 10/03/2020 1306   GLUCOSEU NEGATIVE 10/03/2020 1306   [place] 10/03/2020 [place] 10/03/2020 1306   [place] 10/03/2020 1306   PROTEINUR NEGATIVE 10/03/2020 1306   UROBILINOGEN 0.2 09/11/2010 0647   NITRITE NEGATIVE 10/03/2020 [place] 10/03/2020 1306    Radiological  Exams on Admission: DG Chest 2 View  Result Date: 11/26/2020 CLINICAL DATA:  Congestive heart  failure.  Weight gain. EXAM: CHEST - 2 VIEW COMPARISON:  None. FINDINGS: Normal cardiac silhouette. Bilateral small effusions and basilar atelectasis similar prior. No pulmonary edema. IMPRESSION: Atelectasis and bibasilar effusions.  No pulmonary edema. Electronically Signed   By: Suzy Bouchard M.D.   On: 11/26/2020 15:38    EKG: Independently reviewed. Wide QRS rhythm with rate of 65 bpm possibly junctional.  Nonspecific intraventricular conduction block likely left bundle branch block.  Similar to previous.  Assessment/Plan Principal Problem:   Acute exacerbation of CHF (congestive heart failure) (HCC) Active Problems:   CAD (coronary artery disease)   HTN (hypertension)   Chronic atrial fibrillation (HCC)   Dyspnea on exertion   BPH (benign prostatic hyperplasia)   Gastroesophageal reflux disease with esophagitis   Thrombocytopenia (HCC)   HLD (hyperlipidemia)   MDS (myelodysplastic syndrome), low grade (HCC)   Hyperkalemia   Acute renal failure superimposed on stage 3b chronic kidney disease (HCC)  Acute on chronic systolic and diastolic heart failure > Patient presenting with increasing weight, dyspnea on exertion and edema despite taking p.o. Lasix at home for the past several days. > BNP elevated to 1628 with creatinine of 2.0 up from 1.7.  Chest x-ray with basilar effusions but no pulmonary edema per read, appear to have some vascular congestion. > Mild desaturation to 90% with ambulation but significant dyspnea on exertion in ED.  After receiving 60 mg IV Lasix. > Last echocardiogram in May 2022 with EF 25-30% and G2 DD.  Normal RV function. - Monitor on telemetry - Continue with IV Lasix at 40 mg IV twice daily - Hold off on repeat echocardiogram as he just had 1 in the last 2 months - Continue with home digoxin, metoprolol - Check magnesium - Check dig level  AKI on CKD 3B Hypokalemia > Creatinine elevated to 2.0 from baseline around 1.6-1.7.  Potassium noted to be  5.5 in the ED. > Creatinine elevated in the setting of CHF exacerbation as above we will monitor response to diuresis.  Similarly will monitor potassium response to diuresis. - Avoid nephrotoxic agents - Trend renal function and electrolytes  Stye - Continue home keflex  A. Fib > Not in A. fib in the ED. - Continue metoprolol  Hyperlipidemia CAD status post CABG > Initial troponin mildly elevated at 20 in ED in the setting of CHF situation as above. - Continue home atorvastatin, metoprolol, ranolazine - Trend troponin  BPH - Continue home tamsulosin  GERD - Continue home Carafate, PPI  Anemia Myelodysplastic syndrome > Chronic anemia and thrombocytopenia in the setting of mild dysplastic syndrome, followed outpatient.  On [place] outpatient. > Hemoglobin stable at around 9 and platelets stable at around 110. - Continue to monitor CBC   DVT prophylaxis: Lovenox  Code Status:   Full  Family Communication:  Daughter updated at bedside Disposition Plan:   Patient is from:  Home  Anticipated DC to:  Home  Anticipated DC date:  1 to 2 days  Anticipated DC barriers: None  Consults called:  None  Admission status:  Observation, telemetry   Severity of Illness: The appropriate patient status for this patient is OBSERVATION. Observation status is judged to be reasonable and necessary in order to provide the required intensity of service to ensure the patient's safety. The patient's presenting symptoms, physical exam findings, and initial radiographic and laboratory data in the context of their medical  condition is felt to place them at decreased risk for further clinical deterioration. Furthermore, it is anticipated that the patient will be medically stable for discharge from the hospital within 2 midnights of admission. The following factors support the patient status of observation.   " The patient's presenting symptoms include waking, edema, dyspnea on exertion. " The physical  exam findings include edema. " The initial radiographic and laboratory data are CMP with potassium 5.5, bicarb 21, BUN 45, creatinine 2.0 up from baseline of around 1.7, T bili 1.6.  CBC showed hemoglobin stable at 9 and platelets stable at 110.  Initial troponin mildly elevated at 20 and BNP elevated at 1628.  Respiratory panel for flu and COVID-negative.  Chest x-ray showed bibasilar effusions with no pulmonary edema.Marcelyn Bruins MD [facility]  How to contact the [facility] Attending or Consulting provider [place] or covering provider during after hours [place], for this patient?   Check the care team in [facility] and look for a) attending/consulting [facility] provider listed and b) the [facility] team listed Log into [email or web address] and use [place]'s universal password to access. If you do not have the password, please contact the hospital operator. Locate the [facility] provider you are looking for under [facility] and page to a number that you can be directly reached. If you still have difficulty reaching the provider, please page the [facility] (Director on Call) for the Hospitalists listed on amion for assistance.  11/26/2020, 7:16 PM

## 2020-11-26 NOTE — ED Notes (Signed)
Pt ambulated with walker and pulse ox per MD request, pt did not desat below 90%, pt denies issues with breathing but pt appeared to have an increase of breathing and RR, MD aware.

## 2020-11-26 NOTE — ED Provider Notes (Signed)
[facility] Emergency Department Provider Note  Time seen: 4:07 PM  I have reviewed the triage vital signs and the nursing notes.   HISTORY  Chief Complaint Shortness of Breath   HPI Andres Gutierrez is a 87 y.o. male with a past medical history of anemia, anxiety, CHF, cardiomyopathy, gastric reflux, hypertension, prior MI, presents to the emergency department for shortness of breath and lower extremity edema.  According to the patient and daughter he has been experiencing lower extremity edema worsening over the last week or so.  Saw his doctor several days ago and started Lasix 20 mg for the last 3 days.  States shortness of breath with exertion however the daughter states this is longstanding.  Denies any chest pain.  No fever cough.  Largely negative review of systems otherwise.   Past Medical History:  Diagnosis Date   Anemia    Anginal pain (HCC)    Anxiety    Cardiomyopathy ([place])    Cerebral amyloid angiopathy (HCC)    CHF (congestive heart failure) (HCC)    Chronic kidney disease    kidney stones   Coronary artery disease    Cough    Dyspnea    GERD (gastroesophageal reflux disease)    History of kidney stones    Hypertension    Lymphadenopathy, hilar    MDS (myelodysplastic syndrome) ([place])    MI (myocardial infarction) ([place])    x 2   Wheezing     Patient Active Problem List   Diagnosis Date Noted   Acute on chronic systolic congestive heart failure ([place]) 10/03/2020   Hypomagnesemia 10/03/2020   Pleural effusion 10/03/2020   Chest pain 10/03/2020   GI bleed 08/12/2020   MDS (myelodysplastic syndrome), low grade ([place]) 07/22/2020   History of 2019 novel coronavirus disease (COVID-19) 05/19/2020   Cerebral amyloid angiopathy ([place]) 03/05/2020   Abdominal pain 03/03/2020   HLD (hyperlipidemia) 03/03/2020   Blurry vision 03/03/2020   CKD (chronic kidney disease), stage IIIb 03/03/2020   Left ureteral stone 03/03/2020   Generalized weakness     Cerebral hemorrhage, nontraumatic ([place]) 03/02/2020   Chronic cholecystitis 11/06/2019   Malnutrition of moderate degree 07/30/2019   Acute neutrophilia 07/30/2019   Sepsis ([place]) 07/29/2019   Acute cholecystitis 07/29/2019   Foot infection    Acute on chronic heart failure ([place]) 06/07/2019   Weakness    NSTEMI (non-ST elevated myocardial infarction) ([place])    Orthostatic hypotension 01/07/2019   Symptomatic anemia 08/05/2018   Anemia of chronic kidney failure, stage 3 (moderate) ([place]) 08/05/2018   BPH (benign prostatic hyperplasia) 07/24/2018   Gastroesophageal reflux disease with esophagitis 07/24/2018   Stage 3 chronic kidney disease ([place]) 07/24/2018   Thrombocytopenia ([place]) 07/24/2018   Primary osteoarthritis of both knees 08/22/2017   Medicare annual wellness visit, initial 09/73/5329   Chronic systolic CHF (congestive heart failure), NYHA class 3 ([place]) 92/42/6834   Chronic systolic heart failure ([place]) 01/17/2016   Chewing tobacco use 01/17/2016   Atrial fibrillation with RVR ([place]) 12/23/2015   Dyspnea on exertion 12/23/2015   Elevated troponin 12/23/2015   GERD (gastroesophageal reflux disease) 12/23/2015   Iron deficiency anemia due to chronic blood loss 12/20/2015   Femoral neck fracture, left, closed, initial encounter 11/03/2015   Chronic atrial fibrillation (HCC)    Coronary artery disease involving native coronary artery of native heart without angina pectoris    Acalculous cholecystitis 10/29/2015   B12 deficiency 08/03/2015   Erosive esophagitis 08/03/2015   Adenopathy  CAD (coronary artery disease) 12/22/2014   HTN (hypertension) 12/22/2014   Cardiomyopathy, ischemic 07/27/2014    Past Surgical History:  Procedure Laterality Date   BRONCHIAL NEEDLE ASPIRATION BIOPSY N/A 12/31/2014   Procedure: BRONCHIAL NEEDLE ASPIRATION BIOPSIES from carina;  Surgeon: Flora Lipps, MD;  Location: [facility];  Service: Cardiopulmonary;  Laterality: N/A;   CARDIAC  CATHETERIZATION N/A 12/28/2015   Procedure: Left Heart Cath and Cors/Grafts Angiography;  Surgeon: Yolonda Kida, MD;  Location: [facility];  Service: Cardiovascular;  Laterality: N/A;   CATARACT EXTRACTION W/PHACO Left 08/01/2016   Procedure: CATARACT EXTRACTION PHACO AND INTRAOCULAR LENS PLACEMENT ([place]) Left;  Surgeon: Leandrew Koyanagi, MD;  Location: [facility];  Service: Ophthalmology;  Laterality: Left;   CHOLECYSTECTOMY     COLON SURGERY     CORONARY ANGIOPLASTY WITH STENT PLACEMENT     CORONARY ARTERY BYPASS GRAFT     CYSTOSCOPY/URETEROSCOPY/HOLMIUM LASER/STENT PLACEMENT Left 03/18/2020   Procedure: CYSTOSCOPY/URETEROSCOPY/HOLMIUM LASER/STENT PLACEMENT;  Surgeon: Billey Co, MD;  Location: [facility];  Service: Urology;  Laterality: Left;   ENDOBRONCHIAL ULTRASOUND N/A 12/31/2014   Procedure: ENDOBRONCHIAL ULTRASOUND;  Surgeon: Flora Lipps, MD;  Location: [facility];  Service: Cardiopulmonary;  Laterality: N/A;   ESOPHAGOGASTRODUODENOSCOPY N/A 08/15/2020   Procedure: ESOPHAGOGASTRODUODENOSCOPY (EGD);  Surgeon: Toledo, Benay Pike, MD;  Location: [facility];  Service: Gastroenterology;  Laterality: N/A;   ESOPHAGOGASTRODUODENOSCOPY (EGD) WITH PROPOFOL N/A 06/20/2015   Procedure: ESOPHAGOGASTRODUODENOSCOPY (EGD) WITH PROPOFOL;  Surgeon: Lollie Sails, MD;  Location: [facility] ENDOSCOPY;  Service: Endoscopy;  Laterality: N/A;   IR CATHETER TUBE CHANGE  10/26/2019   IR CHOLANGIOGRAM EXISTING TUBE  08/13/2019    Prior to Admission medications   Medication Sig Start Date End Date Taking? Authorizing Provider  atorvastatin (LIPITOR) 40 MG tablet Take 1 tablet (40 mg total) by mouth daily with supper. 02/27/20   Enzo Bi, MD  digoxin (LANOXIN) 0.125 MG tablet Take 0.0625 mg by mouth daily.    [provider]  famotidine (PEPCID) 20 MG tablet Take 20 mg by mouth at bedtime. 02/04/19   [provider]  ferrous sulfate 325 (65 FE) MG tablet Take 1 tablet (325  mg total) by mouth every Monday, Wednesday, and Friday. With supper 02/29/20   Enzo Bi, MD  furosemide (LASIX) 20 MG tablet Take 1 tablet (20 mg total) by mouth daily. Patient taking differently: Take 10 mg by mouth daily as needed. 3 lb wt gain overnight. 10/04/20   Lorella Nimrod, MD  metoprolol tartrate (LOPRESSOR) 25 MG tablet Take 12.5 mg by mouth 2 (two) times daily.    [provider]  Multiple Vitamin (MULTIVITAMIN) tablet Take 1 tablet by mouth daily.    [provider]  pantoprazole (PROTONIX) 40 MG tablet Take 1 tablet (40 mg total) by mouth 2 (two) times daily. 08/17/20   Lorella Nimrod, MD  polyethylene glycol (MIRALAX / GLYCOLAX) 17 g packet Take 17 g by mouth daily as needed for mild constipation. 08/17/20   Lorella Nimrod, MD  ranolazine (RANEXA) 1000 MG SR tablet Take 500 mg by mouth 2 (two) times daily.  01/16/19   [provider]  sucralfate (CARAFATE) 1 G tablet Take 1 g by mouth 2 (two) times daily.     [provider]  tamsulosin (FLOMAX) 0.4 MG CAPS capsule Take 0.4 mg by mouth daily after supper.     [provider]  vitamin B-12 (CYANOCOBALAMIN) 500 MCG tablet Take 500 mcg by mouth daily.    [provider]    Allergies  Allergen Reactions   Levaquin [Levofloxacin] Other (See Comments)    [name], her daughter, said Levaquin should be avoided because when patient had Levaquin a few years ago, he had "a bad reaction".  She said he could not walk and could not lift up his feet.   Escitalopram Other (See Comments)    Reaction:  Makes him feel faint, like he was going to have a heart attack.   5ht3 Receptor Antagonists Other (See Comments)   Diltiazem Rash   Diphenhydramine Hcl Other (See Comments)    Reaction:  Rash and fever a long time ago, but has had it since with no problem.   Maxidex [Dexamethasone] Other (See Comments)    Reaction:  Unknown    Prednisone Other (See Comments)    Pt states that this medication made him  feel crazy.     Serotonin Other (See Comments)    Tried 2 different types, lexapro and another one.  Reaction:  Made him feel like he was having a heart attack.    Vytorin [Ezetimibe-Simvastatin] Other (See Comments)    Reaction:  Unknown    Zocor [Simvastatin] Other (See Comments)    Reaction:  Unknown     Family History  Problem Relation Age of Onset   CAD Mother    Colon cancer Father     Social History Social History   Tobacco Use   Smoking status: Former   Smokeless tobacco: Current    Types: Nurse, children's Use: Never used  Substance Use Topics   Alcohol use: Yes    Comment: occational almost rare once a year   Drug use: No    Review of Systems Constitutional: Negative for fever. Cardiovascular: Negative for chest pain. Respiratory: Shortness of breath with exertion Gastrointestinal: Negative for abdominal pain, vomiting  Musculoskeletal: Increased lower extremity edema Neurological: Negative for headache All other ROS negative  ____________________________________________   PHYSICAL EXAM:  VITAL SIGNS: ED Triage Vitals  Enc Vitals Group     BP 11/26/20 1533 117/69     Pulse Rate 11/26/20 1533 62     Resp 11/26/20 1533 20     Temp 11/26/20 1538 97.6 F (36.4 C)     Temp Source 11/26/20 1538 Oral     SpO2 11/26/20 1533 98 %     Weight 11/26/20 1514 185 lb (83.9 kg)     Height 11/26/20 1514 6\' 4"  (1.93 m)     Head Circumference --      Peak Flow --      Pain Score 11/26/20 1514 5     Pain Loc --      Pain Edu? --      Excl. in [place]? --    Constitutional: Alert and oriented. Well appearing and in no distress. Eyes: Normal exam ENT      Head: Normocephalic and atraumatic.      Mouth/Throat: Mucous membranes are moist. Cardiovascular: Normal rate, regular rhythm.  Respiratory: Normal respiratory effort without tachypnea nor retractions. Breath sounds are clear Gastrointestinal: Soft and nontender. No distention.  Musculoskeletal: 1-2+  lower extremity edema bilaterally.  Pitting, nontender no erythema. Neurologic:  Normal speech and language. No gross focal neurologic deficits  Skin:  Skin is warm, dry and intact.  Psychiatric: Mood and affect are normal. Speech and behavior are normal.   ____________________________________________    EKG  EKG viewed and interpreted by myself shows a sinus rhythm at 65  bpm with a widened QRS, right axis deviation, prolonged PR interval, nonspecific ST changes.  Largely unchanged from prior EKG 11/22/2020. ____________________________________________    RADIOLOGY  Chest x-ray is negative for acute abnormality.  ____________________________________________   INITIAL IMPRESSION / ASSESSMENT AND PLAN / ED COURSE  Pertinent labs & imaging results that were available during my care of the patient were reviewed by me and considered in my medical decision making (see chart for details).   Patient presents emergency department for worsening lower extreme edema and shortness of breath despite using Lasix over the past 3 days.  Currently satting 96 to 98% at rest in the bed.  We will ambulate to see what his oxygen saturation does.  Daughter states the shortness of breath with ambulation however is longstanding.  Patient does have 1-2+ lower extremity edema.  We will check labs including cardiac enzyme, BNP.  We will dose 60 mg of IV Lasix while awaiting results.  Patient agreeable to plan of care.  Patient's work-up shows significant elevation of BNP of 1600.  Remainder of the work-up is largely at baseline besides mild hyperkalemia.  Patient receiving IV Lasix in the emergency department, which should help both his peripheral edema/BNP as well as hyperkalemia.  We will also dose V[place].  With ambulation patient desats to around 90% but does have significant dyspnea and trouble breathing.  We will admit for IV diuresis.  Andres Gutierrez was evaluated in Emergency Department on 11/26/2020 for the  symptoms described in the history of present illness. He was evaluated in the context of the global COVID-19 pandemic, which necessitated consideration that the patient might be at risk for infection with the SARS-CoV-2 virus that causes COVID-19. Institutional protocols and algorithms that pertain to the evaluation of patients at risk for COVID-19 are in a state of rapid change based on information released by regulatory bodies including the CDC and federal and state organizations. These policies and algorithms were followed during the patient's care in the ED.  ____________________________________________   FINAL CLINICAL IMPRESSION(S) / ED DIAGNOSES  Dyspnea on exertion Peripheral edema Hyperkalemia   Harvest Dark, MD 11/26/20 Bosie Helper

## 2020-11-27 DIAGNOSIS — M170 Bilateral primary osteoarthritis of knee: Secondary | ICD-10-CM | POA: Diagnosis present

## 2020-11-27 DIAGNOSIS — E871 Hypo-osmolality and hyponatremia: Secondary | ICD-10-CM | POA: Diagnosis not present

## 2020-11-27 DIAGNOSIS — N281 Cyst of kidney, acquired: Secondary | ICD-10-CM | POA: Diagnosis not present

## 2020-11-27 DIAGNOSIS — I5020 Unspecified systolic (congestive) heart failure: Secondary | ICD-10-CM | POA: Diagnosis not present

## 2020-11-27 DIAGNOSIS — N1832 Chronic kidney disease, stage 3b: Secondary | ICD-10-CM | POA: Diagnosis not present

## 2020-11-27 DIAGNOSIS — R0600 Dyspnea, unspecified: Secondary | ICD-10-CM | POA: Diagnosis not present

## 2020-11-27 DIAGNOSIS — I509 Heart failure, unspecified: Secondary | ICD-10-CM | POA: Diagnosis not present

## 2020-11-27 DIAGNOSIS — J449 Chronic obstructive pulmonary disease, unspecified: Secondary | ICD-10-CM | POA: Diagnosis present

## 2020-11-27 DIAGNOSIS — Z48813 Encounter for surgical aftercare following surgery on the respiratory system: Secondary | ICD-10-CM | POA: Diagnosis not present

## 2020-11-27 DIAGNOSIS — N179 Acute kidney failure, unspecified: Secondary | ICD-10-CM | POA: Diagnosis present

## 2020-11-27 DIAGNOSIS — I2510 Atherosclerotic heart disease of native coronary artery without angina pectoris: Secondary | ICD-10-CM | POA: Diagnosis not present

## 2020-11-27 DIAGNOSIS — E785 Hyperlipidemia, unspecified: Secondary | ICD-10-CM | POA: Diagnosis present

## 2020-11-27 DIAGNOSIS — D6959 Other secondary thrombocytopenia: Secondary | ICD-10-CM | POA: Diagnosis present

## 2020-11-27 DIAGNOSIS — Z951 Presence of aortocoronary bypass graft: Secondary | ICD-10-CM | POA: Diagnosis not present

## 2020-11-27 DIAGNOSIS — R0602 Shortness of breath: Secondary | ICD-10-CM | POA: Diagnosis present

## 2020-11-27 DIAGNOSIS — I255 Ischemic cardiomyopathy: Secondary | ICD-10-CM | POA: Diagnosis present

## 2020-11-27 DIAGNOSIS — R609 Edema, unspecified: Secondary | ICD-10-CM | POA: Diagnosis not present

## 2020-11-27 DIAGNOSIS — I5023 Acute on chronic systolic (congestive) heart failure: Secondary | ICD-10-CM | POA: Diagnosis not present

## 2020-11-27 DIAGNOSIS — E854 Organ-limited amyloidosis: Secondary | ICD-10-CM | POA: Diagnosis present

## 2020-11-27 DIAGNOSIS — Z66 Do not resuscitate: Secondary | ICD-10-CM | POA: Diagnosis not present

## 2020-11-27 DIAGNOSIS — D469 Myelodysplastic syndrome, unspecified: Secondary | ICD-10-CM | POA: Diagnosis present

## 2020-11-27 DIAGNOSIS — Z8249 Family history of ischemic heart disease and other diseases of the circulatory system: Secondary | ICD-10-CM | POA: Diagnosis not present

## 2020-11-27 DIAGNOSIS — I252 Old myocardial infarction: Secondary | ICD-10-CM | POA: Diagnosis not present

## 2020-11-27 DIAGNOSIS — D631 Anemia in chronic kidney disease: Secondary | ICD-10-CM | POA: Diagnosis present

## 2020-11-27 DIAGNOSIS — K2100 Gastro-esophageal reflux disease with esophagitis, without bleeding: Secondary | ICD-10-CM | POA: Diagnosis present

## 2020-11-27 DIAGNOSIS — Z20822 Contact with and (suspected) exposure to covid-19: Secondary | ICD-10-CM | POA: Diagnosis present

## 2020-11-27 DIAGNOSIS — N200 Calculus of kidney: Secondary | ICD-10-CM | POA: Diagnosis not present

## 2020-11-27 DIAGNOSIS — J811 Chronic pulmonary edema: Secondary | ICD-10-CM | POA: Diagnosis not present

## 2020-11-27 DIAGNOSIS — E8770 Fluid overload, unspecified: Secondary | ICD-10-CM | POA: Diagnosis not present

## 2020-11-27 DIAGNOSIS — I680 Cerebral amyloid angiopathy: Secondary | ICD-10-CM | POA: Diagnosis present

## 2020-11-27 DIAGNOSIS — N184 Chronic kidney disease, stage 4 (severe): Secondary | ICD-10-CM | POA: Diagnosis present

## 2020-11-27 DIAGNOSIS — M4699 Unspecified inflammatory spondylopathy, multiple sites in spine: Secondary | ICD-10-CM | POA: Diagnosis not present

## 2020-11-27 DIAGNOSIS — I130 Hypertensive heart and chronic kidney disease with heart failure and stage 1 through stage 4 chronic kidney disease, or unspecified chronic kidney disease: Secondary | ICD-10-CM | POA: Diagnosis present

## 2020-11-27 DIAGNOSIS — I25119 Atherosclerotic heart disease of native coronary artery with unspecified angina pectoris: Secondary | ICD-10-CM | POA: Diagnosis present

## 2020-11-27 DIAGNOSIS — I517 Cardiomegaly: Secondary | ICD-10-CM | POA: Diagnosis not present

## 2020-11-27 DIAGNOSIS — N400 Enlarged prostate without lower urinary tract symptoms: Secondary | ICD-10-CM | POA: Diagnosis present

## 2020-11-27 DIAGNOSIS — I482 Chronic atrial fibrillation, unspecified: Secondary | ICD-10-CM | POA: Diagnosis present

## 2020-11-27 DIAGNOSIS — I898 Other specified noninfective disorders of lymphatic vessels and lymph nodes: Secondary | ICD-10-CM | POA: Diagnosis not present

## 2020-11-27 DIAGNOSIS — I5043 Acute on chronic combined systolic (congestive) and diastolic (congestive) heart failure: Secondary | ICD-10-CM | POA: Diagnosis present

## 2020-11-27 DIAGNOSIS — J90 Pleural effusion, not elsewhere classified: Secondary | ICD-10-CM | POA: Diagnosis not present

## 2020-11-27 DIAGNOSIS — J9601 Acute respiratory failure with hypoxia: Secondary | ICD-10-CM | POA: Diagnosis not present

## 2020-11-27 DIAGNOSIS — Z7189 Other specified counseling: Secondary | ICD-10-CM | POA: Diagnosis not present

## 2020-11-27 LAB — CBC
HCT: 25.9 % — ABNORMAL LOW (ref 39.0–52.0)
Hemoglobin: 8.7 g/dL — ABNORMAL LOW (ref 13.0–17.0)
MCH: 34.8 pg — ABNORMAL HIGH (ref 26.0–34.0)
MCHC: 33.6 g/dL (ref 30.0–36.0)
MCV: 103.6 fL — ABNORMAL HIGH (ref 80.0–100.0)
Platelets: 71 10*3/uL — ABNORMAL LOW (ref 150–400)
RBC: 2.5 MIL/uL — ABNORMAL LOW (ref 4.22–5.81)
RDW: 20.3 % — ABNORMAL HIGH (ref 11.5–15.5)
WBC: 5.8 10*3/uL (ref 4.0–10.5)
nRBC: 0.3 % — ABNORMAL HIGH (ref 0.0–0.2)

## 2020-11-27 LAB — BASIC METABOLIC PANEL
Anion gap: 10 (ref 5–15)
BUN: 44 mg/dL — ABNORMAL HIGH (ref 8–23)
CO2: 21 mmol/L — ABNORMAL LOW (ref 22–32)
Calcium: 8.8 mg/dL — ABNORMAL LOW (ref 8.9–10.3)
Chloride: 104 mmol/L (ref 98–111)
Creatinine, Ser: 1.87 mg/dL — ABNORMAL HIGH (ref 0.61–1.24)
GFR, Estimated: 34 mL/min — ABNORMAL LOW (ref >60)
Glucose, Bld: 85 mg/dL (ref 70–99)
Potassium: 4.7 mmol/L (ref 3.5–5.1)
Sodium: 135 mmol/L (ref 135–145)

## 2020-11-27 LAB — IRON AND TIBC
Iron: 43 ug/dL — ABNORMAL LOW (ref 45–182)
Saturation Ratios: 18 % (ref 17.9–39.5)
TIBC: 244 ug/dL — ABNORMAL LOW (ref 250–450)
UIBC: 201 ug/dL

## 2020-11-27 LAB — VITAMIN B12: Vitamin B-12: 2117 pg/mL — ABNORMAL HIGH (ref 180–914)

## 2020-11-27 MED ORDER — ISOSORBIDE MONONITRATE ER 30 MG PO TB24
30.0000 mg | ORAL_TABLET | Freq: Every day | ORAL | Status: DC
  Administered 2020-11-27 – 2020-12-07 (×11): 30 mg via ORAL
  Filled 2020-11-27 (×11): qty 1

## 2020-11-27 MED ORDER — HYDRALAZINE HCL 25 MG PO TABS
25.0000 mg | ORAL_TABLET | Freq: Two times a day (BID) | ORAL | Status: DC
  Administered 2020-11-27 – 2020-12-07 (×8): 25 mg via ORAL
  Filled 2020-11-27 (×15): qty 1

## 2020-11-27 NOTE — NC FL2 (Addendum)
[place] LEVEL OF CARE SCREENING TOOL     IDENTIFICATION  Patient Name: Andres Gutierrez Birthdate: 1930-12-31 Sex: male Admission Date (Current Location): 11/26/2020  [facility] [place] Number:  Engineering geologist and Address:  [facility], [address], [place], [place] [address]      Provider Number: [id number]  Attending Physician Name and Address:  Sharen Hones, MD  Relative Name and Phone Number:  [name] (daughter) [phone]    Current Level of Care: Hospital Recommended Level of Care: [place] Prior Approval Number:    Date Approved/Denied:   PASRR Number: [id number]  Discharge Plan: SNF    Current Diagnoses: Patient Active Problem List   Diagnosis Date Noted   Acute on chronic combined systolic (congestive) and diastolic (congestive) heart failure ([place]) 11/27/2020   Acute exacerbation of CHF (congestive heart failure) ([place]) 11/26/2020   Hyperkalemia 11/26/2020   Acute renal failure superimposed on stage 3b chronic kidney disease ([place]) 11/26/2020   Hypomagnesemia 10/03/2020   Pleural effusion 10/03/2020   Chest pain 10/03/2020   GI bleed 08/12/2020   MDS (myelodysplastic syndrome), low grade ([place]) 07/22/2020   History of 2019 novel coronavirus disease (COVID-19) 05/19/2020   Cerebral amyloid angiopathy ([place]) 03/05/2020   Abdominal pain 03/03/2020   HLD (hyperlipidemia) 03/03/2020   Blurry vision 03/03/2020   CKD (chronic kidney disease), stage IIIb 03/03/2020   Left ureteral stone 03/03/2020   Generalized weakness    Cerebral hemorrhage, nontraumatic ([place]) 03/02/2020   Chronic cholecystitis 11/06/2019   Malnutrition of moderate degree 07/30/2019   Acute neutrophilia 07/30/2019   Sepsis ([place]) 07/29/2019   Acute cholecystitis 07/29/2019   Foot infection    Weakness    NSTEMI (non-ST elevated myocardial infarction) ([place])    Orthostatic hypotension 01/07/2019   Symptomatic anemia 08/05/2018    Anemia of chronic kidney failure, stage 3 (moderate) ([place]) 08/05/2018   BPH (benign prostatic hyperplasia) 07/24/2018   Gastroesophageal reflux disease with esophagitis 07/24/2018   Thrombocytopenia ([place]) 07/24/2018   Primary osteoarthritis of both knees 08/22/2017   Medicare annual wellness visit, initial 19/50/9326   Chronic systolic CHF (congestive heart failure), NYHA class 3 ([place]) 11/27/2016   Chewing tobacco use 01/17/2016   Dyspnea on exertion 12/23/2015   Elevated troponin 12/23/2015   Iron deficiency anemia due to chronic blood loss 12/20/2015   Femoral neck fracture, left, closed, initial encounter 11/03/2015   Chronic atrial fibrillation (HCC)    Coronary artery disease involving native coronary artery of native heart without angina pectoris    B12 deficiency 08/03/2015   Erosive esophagitis 08/03/2015   Adenopathy    CAD (coronary artery disease) 12/22/2014   HTN (hypertension) 12/22/2014   Cardiomyopathy, ischemic 07/27/2014    Orientation RESPIRATION BLADDER Height & Weight     Self, Time, Situation, Place  O2 (2L nasal cannula) Incontinent, External catheter Weight: 184 lb 15.5 oz (83.9 kg) Height:  6\' 4"  (193 cm)  BEHAVIORAL SYMPTOMS/MOOD NEUROLOGICAL BOWEL NUTRITION STATUS      Continent Diet (see discharge summary)  AMBULATORY STATUS COMMUNICATION OF NEEDS Skin   Limited Assist Verbally Normal                       Personal Care Assistance Level of Assistance  Bathing, Feeding, Dressing, Total care Bathing Assistance: Limited assistance Feeding assistance: Independent Dressing Assistance: Limited assistance Total Care Assistance: Limited assistance   Functional Limitations Info  Sight, Hearing, Speech Sight Info: Adequate Hearing Info: Adequate  Speech Info: Adequate    SPECIAL CARE FACTORS FREQUENCY  PT (By licensed PT), OT (By licensed OT)     PT Frequency: min 4x weekly OT Frequency: min 4x weekly            Contractures Contractures  Info: Not present    Additional Factors Info  Code Status, Allergies Code Status Info: Full Allergies Info: Levaquin (Levofloxacin)   Escitalopram   5ht3 Receptor Antagonists   Diltiazem   Diphenhydramine Hcl   Maxidex (Dexamethasone)   Prednisone   Serotonin   Vytorin (Ezetimibe-simvastatin)   Zocor (Simvastatin)           Current Medications (11/27/2020):  This is the current hospital active medication list Current Facility-Administered Medications  Medication Dose Route Frequency Provider Last Rate Last Admin   acetaminophen (TYLENOL) tablet 650 mg  650 mg Oral Q6H PRN Marcelyn Bruins, MD       Or   acetaminophen (TYLENOL) suppository 650 mg  650 mg Rectal Q6H PRN Marcelyn Bruins, MD       atorvastatin (LIPITOR) tablet 40 mg  40 mg Oral QHS Marcelyn Bruins, MD   40 mg at 11/26/20 2219   cephALEXin (KEFLEX) capsule 500 mg  500 mg Oral TID Marcelyn Bruins, MD   500 mg at 11/27/20 0809   digoxin (LANOXIN) tablet 0.0625 mg  0.0625 mg Oral Daily Marcelyn Bruins, MD   0.0625 mg at 11/27/20 0809   [START ON 11/28/2020] ferrous sulfate tablet 325 mg  325 mg Oral Q M,W,F Marcelyn Bruins, MD       furosemide (LASIX) injection 40 mg  40 mg Intravenous BID Marcelyn Bruins, MD   40 mg at 11/27/20 3570   hydrALAZINE (APRESOLINE) tablet 25 mg  25 mg Oral BID [place]wood, Dwayne D, MD       isosorbide mononitrate (IMDUR) 24 hr tablet 30 mg  30 mg Oral Daily [place]wood, Dwayne D, MD       metoprolol tartrate (LOPRESSOR) tablet 12.5 mg  12.5 mg Oral BID Marcelyn Bruins, MD   12.5 mg at 11/27/20 1779   polyethylene glycol (MIRALAX / GLYCOLAX) packet 17 g  17 g Oral Daily PRN Marcelyn Bruins, MD       ranolazine (RANEXA) 12 hr tablet 500 mg  500 mg Oral BID Marcelyn Bruins, MD   500 mg at 11/27/20 3903   sodium chloride flush (NS) 0.9 % injection 3 mL  3 mL Intravenous Q12H Marcelyn Bruins, MD   3 mL at 11/27/20 0809   sucralfate (CARAFATE) tablet 1 g  1 g Oral BID  Marcelyn Bruins, MD   1 g at 11/27/20 0092   tamsulosin (FLOMAX) capsule 0.4 mg  0.4 mg Oral QPC supper Marcelyn Bruins, MD   0.4 mg at 11/26/20 2253     Discharge Medications: Please see discharge summary for a list of discharge medications.  Relevant Imaging Results:  Relevant Lab Results:   Additional Information SSN: [id number]  Alberteen Sam, LCSW

## 2020-11-27 NOTE — Consult Note (Signed)
CARDIOLOGY CONSULT NOTE               Patient ID: Andres Gutierrez MRN: [id number] DOB/AGE: 07/18/1930 87 y.o.  Admit date: 11/26/2020 Referring Physician Neva Seat, MD hospitalist Primary Physician Dr. Emily Filbert primary Primary Cardiologist none  Reason for Consultation congestive heart failure shortness of breath edema  HPI: 87 year old male history of congestive heart failure chronic renal sufficiency atrial fibrillation coronary bypass surgery coronary disease cerebral amyloid angiography GERD hyperlipidemia hypertension myelodysplastic syndrome patient presented with shortness of breath dyspnea weakness fatigue patient has significant weight gain with dyspnea patient has been on Lasix but had not prevent weight gain patient has been started on antibiotics for an infection he denies any fever chills or sweats.  Patient found to have renal insufficiency BNP was elevated suggestive of congestive heart failure patient was hypoxic suggestive heart failure atrial fibrillation acute on chronic respiratory failure COPD  Review of systems complete and found to be negative unless listed above     Past Medical History:  Diagnosis Date   Acalculous cholecystitis 10/29/2015   Acute on chronic heart failure ([place]) 06/07/2019   Anemia    Anginal pain (HCC)    Anxiety    Atrial fibrillation with RVR ([place]) 12/23/2015   Cardiomyopathy (HCC)    Cerebral amyloid angiopathy (HCC)    CHF (congestive heart failure) (HCC)    Chronic kidney disease    kidney stones   Coronary artery disease    Cough    Dyspnea    GERD (gastroesophageal reflux disease)    GERD (gastroesophageal reflux disease) 12/23/2015   History of kidney stones    Hypertension    Lymphadenopathy, hilar    MDS (myelodysplastic syndrome) (HCC)    MI (myocardial infarction) ([place])    x 2   Wheezing     Past Surgical History:  Procedure Laterality Date   BRONCHIAL NEEDLE ASPIRATION BIOPSY N/A 12/31/2014   Procedure:  BRONCHIAL NEEDLE ASPIRATION BIOPSIES from [place];  Surgeon: Flora Lipps, MD;  Location: [facility];  Service: Cardiopulmonary;  Laterality: N/A;   CARDIAC CATHETERIZATION N/A 12/28/2015   Procedure: Left Heart Cath and Cors/Grafts Angiography;  Surgeon: Yolonda Kida, MD;  Location: [facility];  Service: Cardiovascular;  Laterality: N/A;   CATARACT EXTRACTION W/PHACO Left 08/01/2016   Procedure: CATARACT EXTRACTION PHACO AND INTRAOCULAR LENS PLACEMENT ([place]) Left;  Surgeon: Leandrew Koyanagi, MD;  Location: [facility];  Service: Ophthalmology;  Laterality: Left;   CHOLECYSTECTOMY     COLON SURGERY     CORONARY ANGIOPLASTY WITH STENT PLACEMENT     CORONARY ARTERY BYPASS GRAFT     CYSTOSCOPY/URETEROSCOPY/HOLMIUM LASER/STENT PLACEMENT Left 03/18/2020   Procedure: CYSTOSCOPY/URETEROSCOPY/HOLMIUM LASER/STENT PLACEMENT;  Surgeon: Billey Co, MD;  Location: [facility];  Service: Urology;  Laterality: Left;   ENDOBRONCHIAL ULTRASOUND N/A 12/31/2014   Procedure: ENDOBRONCHIAL ULTRASOUND;  Surgeon: Flora Lipps, MD;  Location: [facility];  Service: Cardiopulmonary;  Laterality: N/A;   ESOPHAGOGASTRODUODENOSCOPY N/A 08/15/2020   Procedure: ESOPHAGOGASTRODUODENOSCOPY (EGD);  Surgeon: Toledo, Benay Pike, MD;  Location: [facility];  Service: Gastroenterology;  Laterality: N/A;   ESOPHAGOGASTRODUODENOSCOPY (EGD) WITH PROPOFOL N/A 06/20/2015   Procedure: ESOPHAGOGASTRODUODENOSCOPY (EGD) WITH PROPOFOL;  Surgeon: Lollie Sails, MD;  Location: [facility];  Service: Endoscopy;  Laterality: N/A;   IR CATHETER TUBE CHANGE  10/26/2019   IR CHOLANGIOGRAM EXISTING TUBE  08/13/2019    Medications Prior to Admission  Medication Sig Dispense Refill Last Dose   acetaminophen (TYLENOL) 500 MG tablet  Take 500 mg by mouth 2 (two) times daily as needed for mild pain or moderate pain.   Past Month   Ascorbic Acid (VITAMIN C PO) Take 1 tablet by mouth daily with breakfast.   11/26/2020   atorvastatin  (LIPITOR) 40 MG tablet Take 1 tablet (40 mg total) by mouth daily with supper. (Patient taking differently: Take 40 mg by mouth every evening.)   11/25/2020   cephALEXin (KEFLEX) 500 MG capsule Take 500 mg by mouth 3 (three) times daily.   11/25/2020   digoxin (LANOXIN) 0.125 MG tablet Take 0.0625 mg by mouth daily.   11/26/2020   famotidine (PEPCID) 20 MG tablet Take 20 mg by mouth at bedtime.   11/25/2020   ferrous sulfate 325 (65 FE) MG tablet Take 1 tablet (325 mg total) by mouth every Monday, Wednesday, and Friday. With supper   11/25/2020   furosemide (LASIX) 40 MG tablet Take 20 mg by mouth See admin instructions. Take 20 mg by mouth for weight gain for 7 days starting 07.14.2022   11/26/2020   metoprolol tartrate (LOPRESSOR) 25 MG tablet Take 12.5 mg by mouth 2 (two) times daily.   11/26/2020 at 0900   Multiple Vitamin (MULTIVITAMIN) tablet Take 1 tablet by mouth daily.   11/26/2020   pantoprazole (PROTONIX) 40 MG tablet Take 1 tablet (40 mg total) by mouth 2 (two) times daily. 60 tablet 1 11/26/2020   polyethylene glycol (MIRALAX / GLYCOLAX) 17 g packet Take 17 g by mouth daily as needed for mild constipation. 14 each 0 unk at PRN   ranolazine (RANEXA) 1000 MG SR tablet Take 500 mg by mouth 2 (two) times daily.    11/26/2020   sucralfate (CARAFATE) 1 G tablet Take 1 g by mouth 2 (two) times daily.    11/26/2020   tamsulosin (FLOMAX) 0.4 MG CAPS capsule Take 0.4 mg by mouth daily after supper.    11/25/2020   vitamin B-12 (CYANOCOBALAMIN) 500 MCG tablet Take 500 mcg by mouth daily.   11/26/2020   furosemide (LASIX) 20 MG tablet Take 1 tablet (20 mg total) by mouth daily. (Patient not taking: Reported on 11/26/2020) 30 tablet 0 Not Taking   Social History   Socioeconomic History   Marital status: Widowed    Spouse name: Not on file   Number of children: 2   Years of education: college   Highest education level: Some college, no degree  Occupational History   Occupation: retired  Tobacco Use    Smoking status: Former   Smokeless tobacco: Current    Types: Nurse, children's Use: Never used  Substance and Sexual Activity   Alcohol use: Yes    Comment: occational almost rare once a year   Drug use: No   Sexual activity: Not Currently    Birth control/protection: Abstinence  Other Topics Concern   Not on file  Social History Narrative   Not on file   Social Determinants of Health   Financial Resource Strain: Not on file  Food Insecurity: Not on file  Transportation Needs: Not on file  Physical Activity: Not on file  Stress: Not on file  Social Connections: Not on file  Intimate Partner Violence: Not on file    Family History  Problem Relation Age of Onset   CAD Mother    Colon cancer Father       Review of systems complete and found to be negative unless listed above      PHYSICAL  EXAM  General: Well developed, well nourished, in no acute distress HEENT:  Normocephalic and atramatic Neck:  No JVD.  Lungs: Diminished bilaterally to auscultation and percussion. Heart: Irregular irregular normal S1 and S2 without gallops or murmurs.  Abdomen: Bowel sounds are positive, abdomen soft and non-tender  Msk:  Back normal, normal gait. Normal strength and tone for age. Extremities: No clubbing, cyanosis or edema.   Neuro: Alert and oriented X 3. Psych:  Good affect, responds appropriately  Labs:   Lab Results  Component Value Date   WBC 5.8 11/27/2020   HGB 8.7 (L) 11/27/2020   HCT 25.9 (L) 11/27/2020   MCV 103.6 (H) 11/27/2020   PLT 71 (L) 11/27/2020    Recent Labs  Lab 11/26/20 1536 11/27/20 0436  NA 136 135  K 5.5* 4.7  CL 107 104  CO2 21* 21*  BUN 45* 44*  CREATININE 2.02* 1.87*  CALCIUM 9.3 8.8*  PROT 7.6  --   BILITOT 1.6*  --   ALKPHOS 54  --   ALT 9  --   AST 35  --   GLUCOSE 101* 85   Lab Results  Component Value Date   CKTOTAL 139 09/11/2010   CKMB 1.8 09/11/2010   TROPONINI 0.03 (HH) 02/25/2017    Lab Results   Component Value Date   CHOL 74 10/04/2020   CHOL 79 02/27/2020   CHOL 117 12/26/2015   Lab Results  Component Value Date   HDL 19 (L) 10/04/2020   HDL 18 (L) 02/27/2020   HDL 21 (L) 12/26/2015   Lab Results  Component Value Date   LDLCALC 41 10/04/2020   LDLCALC 38 02/27/2020   LDLCALC 80 12/26/2015   Lab Results  Component Value Date   TRIG 72 10/04/2020   TRIG 116 02/27/2020   TRIG 78 12/26/2015   Lab Results  Component Value Date   CHOLHDL 3.9 10/04/2020   CHOLHDL 4.4 02/27/2020   CHOLHDL 5.6 12/26/2015   No results found for: LDLDIRECT    Radiology: DG Chest 2 View  Result Date: 11/26/2020 CLINICAL DATA:  Congestive heart failure.  Weight gain. EXAM: CHEST - 2 VIEW COMPARISON:  None. FINDINGS: Normal cardiac silhouette. Bilateral small effusions and basilar atelectasis similar prior. No pulmonary edema. IMPRESSION: Atelectasis and bibasilar effusions.  No pulmonary edema. Electronically Signed   By: Suzy Bouchard M.D.   On: 11/26/2020 15:38   MR ANGIO HEAD WO CONTRAST  Result Date: 11/22/2020 CLINICAL DATA:  Code stroke follow-up EXAM: MRI HEAD WITHOUT CONTRAST MRA HEAD WITHOUT CONTRAST MRA NECK WITHOUT CONTRAST TECHNIQUE: Multiplanar, multiecho pulse sequences of the brain and surrounding structures were obtained without intravenous contrast. Angiographic images of the Circle of Willis were obtained using MRA technique without intravenous contrast. Angiographic images of the neck were obtained using MRA technique without intravenous contrast. Carotid stenosis measurements (when applicable) are obtained utilizing NASCET criteria, using the distal internal carotid diameter as the denominator. COMPARISON:  03/03/2020 FINDINGS: MRI HEAD Brain: There is no acute infarction or intracranial hemorrhage. There is no intracranial mass, mass effect, or edema. There is no hydrocephalus or extra-axial fluid collection. Prominence of the ventricles and sulci reflects generalized  parenchymal volume loss. Patchy and confluent areas of T2 hyperintensity in the supratentorial white matter are nonspecific but may reflect moderate chronic microvascular ischemic changes. Bilateral basal ganglia prominent perivascular spaces with possible superimposed chronic small vessel infarcts. Small chronic infarct of the left occipitotemporal lobe with chronic blood products. Small chronic right occipital  cortical infarct. Few scattered small foci of susceptibility hypointensity in the subcortical cerebral white matter and cerebellum likely reflecting chronic microhemorrhages. Vascular: Major vessel flow voids at the skull base are preserved. Skull and upper cervical spine: Normal marrow signal is preserved. Sinuses/Orbits: Paranasal sinuses are aerated. Bilateral lens replacements. Other: Sella is unremarkable.  Mastoid air cells are clear. MRA HEAD Motion artifact is present with suboptimal evaluation of distal branches. Intracranial internal carotid arteries are patent. Middle and anterior cerebral arteries are patent. Intracranial vertebral arteries, basilar artery, posterior cerebral arteries are patent. Left posterior communicating artery is present. There is no significant stenosis or aneurysm. MRA NECK Motion artifact is present. There is preserved flow related enhancement of common, internal, and external carotid arteries. Internal carotid artery origins are not well evaluated but there is no apparent high-grade stenosis. Extracranial vertebral arteries are patent, noting that origins are not well evaluated. Left vertebral artery is dominant. IMPRESSION: No acute infarction, hemorrhage, or mass. Similar appearance of chronic microvascular ischemic changes, chronic infarcts, and scattered chronic microhemorrhages. Motion degraded vascular imaging of the neck. No apparent high-grade stenosis. No proximal intracranial vessel occlusion or significant stenosis. Electronically Signed   By: Macy Mis  M.D.   On: 11/22/2020 13:05   MR ANGIO NECK WO CONTRAST  Result Date: 11/22/2020 CLINICAL DATA:  Code stroke follow-up EXAM: MRI HEAD WITHOUT CONTRAST MRA HEAD WITHOUT CONTRAST MRA NECK WITHOUT CONTRAST TECHNIQUE: Multiplanar, multiecho pulse sequences of the brain and surrounding structures were obtained without intravenous contrast. Angiographic images of the Circle of Willis were obtained using MRA technique without intravenous contrast. Angiographic images of the neck were obtained using MRA technique without intravenous contrast. Carotid stenosis measurements (when applicable) are obtained utilizing NASCET criteria, using the distal internal carotid diameter as the denominator. COMPARISON:  03/03/2020 FINDINGS: MRI HEAD Brain: There is no acute infarction or intracranial hemorrhage. There is no intracranial mass, mass effect, or edema. There is no hydrocephalus or extra-axial fluid collection. Prominence of the ventricles and sulci reflects generalized parenchymal volume loss. Patchy and confluent areas of T2 hyperintensity in the supratentorial white matter are nonspecific but may reflect moderate chronic microvascular ischemic changes. Bilateral basal ganglia prominent perivascular spaces with possible superimposed chronic small vessel infarcts. Small chronic infarct of the left occipitotemporal lobe with chronic blood products. Small chronic right occipital cortical infarct. Few scattered small foci of susceptibility hypointensity in the subcortical cerebral white matter and cerebellum likely reflecting chronic microhemorrhages. Vascular: Major vessel flow voids at the skull base are preserved. Skull and upper cervical spine: Normal marrow signal is preserved. Sinuses/Orbits: Paranasal sinuses are aerated. Bilateral lens replacements. Other: Sella is unremarkable.  Mastoid air cells are clear. MRA HEAD Motion artifact is present with suboptimal evaluation of distal branches. Intracranial internal carotid  arteries are patent. Middle and anterior cerebral arteries are patent. Intracranial vertebral arteries, basilar artery, posterior cerebral arteries are patent. Left posterior communicating artery is present. There is no significant stenosis or aneurysm. MRA NECK Motion artifact is present. There is preserved flow related enhancement of common, internal, and external carotid arteries. Internal carotid artery origins are not well evaluated but there is no apparent high-grade stenosis. Extracranial vertebral arteries are patent, noting that origins are not well evaluated. Left vertebral artery is dominant. IMPRESSION: No acute infarction, hemorrhage, or mass. Similar appearance of chronic microvascular ischemic changes, chronic infarcts, and scattered chronic microhemorrhages. Motion degraded vascular imaging of the neck. No apparent high-grade stenosis. No proximal intracranial vessel occlusion or significant stenosis. Electronically  Signed   By: Macy Mis M.D.   On: 11/22/2020 13:05   MR BRAIN WO CONTRAST  Result Date: 11/22/2020 CLINICAL DATA:  Code stroke follow-up EXAM: MRI HEAD WITHOUT CONTRAST MRA HEAD WITHOUT CONTRAST MRA NECK WITHOUT CONTRAST TECHNIQUE: Multiplanar, multiecho pulse sequences of the brain and surrounding structures were obtained without intravenous contrast. Angiographic images of the Circle of Willis were obtained using MRA technique without intravenous contrast. Angiographic images of the neck were obtained using MRA technique without intravenous contrast. Carotid stenosis measurements (when applicable) are obtained utilizing NASCET criteria, using the distal internal carotid diameter as the denominator. COMPARISON:  03/03/2020 FINDINGS: MRI HEAD Brain: There is no acute infarction or intracranial hemorrhage. There is no intracranial mass, mass effect, or edema. There is no hydrocephalus or extra-axial fluid collection. Prominence of the ventricles and sulci reflects generalized  parenchymal volume loss. Patchy and confluent areas of T2 hyperintensity in the supratentorial white matter are nonspecific but may reflect moderate chronic microvascular ischemic changes. Bilateral basal ganglia prominent perivascular spaces with possible superimposed chronic small vessel infarcts. Small chronic infarct of the left occipitotemporal lobe with chronic blood products. Small chronic right occipital cortical infarct. Few scattered small foci of susceptibility hypointensity in the subcortical cerebral white matter and cerebellum likely reflecting chronic microhemorrhages. Vascular: Major vessel flow voids at the skull base are preserved. Skull and upper cervical spine: Normal marrow signal is preserved. Sinuses/Orbits: Paranasal sinuses are aerated. Bilateral lens replacements. Other: Sella is unremarkable.  Mastoid air cells are clear. MRA HEAD Motion artifact is present with suboptimal evaluation of distal branches. Intracranial internal carotid arteries are patent. Middle and anterior cerebral arteries are patent. Intracranial vertebral arteries, basilar artery, posterior cerebral arteries are patent. Left posterior communicating artery is present. There is no significant stenosis or aneurysm. MRA NECK Motion artifact is present. There is preserved flow related enhancement of common, internal, and external carotid arteries. Internal carotid artery origins are not well evaluated but there is no apparent high-grade stenosis. Extracranial vertebral arteries are patent, noting that origins are not well evaluated. Left vertebral artery is dominant. IMPRESSION: No acute infarction, hemorrhage, or mass. Similar appearance of chronic microvascular ischemic changes, chronic infarcts, and scattered chronic microhemorrhages. Motion degraded vascular imaging of the neck. No apparent high-grade stenosis. No proximal intracranial vessel occlusion or significant stenosis. Electronically Signed   By: Macy Mis  M.D.   On: 11/22/2020 13:05   CT HEAD CODE STROKE WO CONTRAST  Result Date: 11/22/2020 CLINICAL DATA:  Code stroke.  Neuro deficit, acute stroke suspected. EXAM: CT HEAD WITHOUT CONTRAST TECHNIQUE: Contiguous axial images were obtained from the base of the skull through the vertex without intravenous contrast. COMPARISON:  July 18, 2020. FINDINGS: Brain: No evidence of acute large vascular territory infarction, acute hemorrhage, hydrocephalus, extra-axial collection or mass lesion/mass effect. Small hypodensities in the left basal ganglia and right posterior limb of internal capsule, likely the sequela of prior lacunar infarcts and similar on prior MRI. Sequela of prior left occipital hemorrhage and suspected amyloid angiopathy better characterized on prior MRI. Similar additional patchy white matter hypoattenuation, most likely related to chronic microvascular ischemic disease. Similar atrophy with ex vacuo ventricular dilation. Similar areas of mineralization along the falx/tentorial leaflets. Vascular: No hyperdense vessel identified. Calcific atherosclerosis. Skull: No acute fracture. Sinuses/Orbits: Mild left maxillary sinus and ethmoid air cell mucosal thickening, partially imaged. No visible air-fluid levels. No acute orbital findings. Other: No mastoid effusions. ASPECTS [facility] Stroke Program Early CT Score) total score (0-10 with  10 being normal): 10. IMPRESSION: 1. No evidence of acute large vascular territory infarct or acute hemorrhage. ASPECTS is 10. 2. Small chronic microvascular ischemic disease and suspected prior lacunar infarcts in the left basal ganglia and right posterior limb of internal capsule, likely the sequela of prior lacunar infarcts and similar on prior MRI. 3. Sequela of prior left occipital hemorrhage and suspected amyloid angiopathy better characterized on prior MRI. Code stroke imaging results were communicated on 11/22/2020 at 8:42 am to provider Dr. Corky Downs via telephone, who  verbally acknowledged these results. Electronically Signed   By: Margaretha Sheffield MD   On: 11/22/2020 08:46    EKG: Probable atrial fibrillation rate controlled interventricular conduction delay possibly incomplete l Dr. Neva Seat hospitalist right bundle branch block Q waves inferiorly probable Q waves anteriorly suggestive of both inferior and anterior infarct  ASSESSMENT AND PLAN:  Acute congestive heart failure Coronary artery disease Hypertension Chronic atrial fibrillation Dyspnea on exertion GERD Hyperlipidemia CAGB Cerebral amyloid angiopathy Angina Anemia . Plan Agree with anticoagulation for atrial fibrillation Heparin IV consider switching to Eliquis Continue current therapy for COPD bronchitis Hypertension continue current therapy metoprolol Acute on chronic systolic congestive heart failure elevated BNP continue diuretics Echocardiogram for evaluation of systolic heart failure cardiomyopathy Cardiomyopathy ejection fraction between 25 and 30% in the past but recommend further evaluation continue heart failure therapy avoid ACE ARB or Entresto because of renal sufficiency Consider imdur hydralazine for heart failure as well as beta-blocker Okay to continue low-dose digoxin for heart failure therapy Supplemental oxygen therapy for hypoxemia Recommend GERD therapy with omeprazole Carafate Pepcid as necessary Hyperlipidemia continue Lipitor therapy for lipid management Angina continue Ranexa therapy Acute on chronic renal sufficiency recommend avoid nephrotoxic drugs consider nephrology input  Signed: Yolonda Kida MD 11/27/2020, 10:37 AM

## 2020-11-27 NOTE — Progress Notes (Signed)
PROGRESS NOTE    Andres Gutierrez  PFX:[id number] DOB: 04/10/1931 DOA: 11/26/2020 PCP: Rusty Aus, MD   Chief complaint.  Shortness of breath. Brief Narrative:  Andres Gutierrez is a 87 y.o. male with medical history significant of CHF, CKD 3B, A. fib, BPH, CAD status post CABG, cerebral amyloid angiopathy, GERD, hyperlipidemia, hypertension, iron deficiency anemia, myelodysplastic syndrome who presents with ongoing increased dyspnea on exertion, edema, weight gain. Echocardiogram performed in May 2022 showed ejection fraction 25 to 30%.  He is started on IV Lasix  Assessment & Plan:   Principal Problem:   Acute exacerbation of CHF (congestive heart failure) (HCC) Active Problems:   CAD (coronary artery disease)   HTN (hypertension)   Chronic atrial fibrillation (HCC)   Dyspnea on exertion   BPH (benign prostatic hyperplasia)   Gastroesophageal reflux disease with esophagitis   Thrombocytopenia (HCC)   HLD (hyperlipidemia)   MDS (myelodysplastic syndrome), low grade (HCC)   Hyperkalemia   Acute renal failure superimposed on stage 3b chronic kidney disease ([place])  #1.  Acute on chronic combined systolic and diastolic congestive heart failure. Patient had a significant weight gain, dyspnea on exertion and edema.  Elevation BNP and minimal elevation of troponin. Chest x-ray showed bibasilar pleural effusion.  I personally reviewed x-ray, amount of pleural effusion is small. He is currently treated with Lasix 40 mg IV twice a day, will continue for another day. Will consult cardiology.  #2.  Acute kidney injury on chronic kidney disease stage IIIb. Hyperkalemia. Renal function getting better after giving Lasix, potassium normalized.  #3.  Coronary artery disease with status post CABG. Continue home medicines.  #4.  Anemia  Thrombocytopenia Myelodysplastic syndrome. Check iron B12 level.  Discontinue Lovenox.  5.  Paroxysmal atrial fibrillation. Continue to follow, patient not  on anticoagulation at home.   DVT prophylaxis: SCDs Code Status: Full Family Communication: daughter updated  Disposition Plan:    Status is: Observation  The patient will require care spanning > 2 midnights and should be moved to inpatient because: Inpatient level of care appropriate due to severity of illness  Dispo: The patient is from: Home              Anticipated d/c is to: Home              Patient currently is not medically stable to d/c.   Difficult to place patient No        I/O last 3 completed shifts: In: 180 [P.O.:180] Out: 875 [Urine:875] No intake/output data recorded.     Consultants:  Card  Procedures: None  Antimicrobials:   Subjective: Patient doing better today, still has some wheezing and short of breath with exertion.  Leg edema has improved. Denies any chest pain or palpitation. No fever or chills. No abdominal pain or nausea vomiting. No dysuria hematuria.  Objective: Vitals:   11/27/20 0215 11/27/20 0223 11/27/20 0625 11/27/20 0805  BP: (!) 100/55  (!) 100/55 (!) 108/57  Pulse: 60  65 61  Resp: 18  19 18   Temp: 97.6 F (36.4 C)  97.7 F (36.5 C) 97.9 F (36.6 C)  TempSrc: Oral  Oral Oral  SpO2: (!) 89% 95% 95% 95%  Weight: 83.9 kg     Height:        Intake/Output Summary (Last 24 hours) at 11/27/2020 0919 Last data filed at 11/27/2020 0200 Gross per 24 hour  Intake 180 ml  Output 875 ml  Net -695 ml  Filed Weights   11/26/20 1514 11/26/20 2047 11/27/20 0215  Weight: 83.9 kg 83.9 kg 83.9 kg    Examination:  General exam: Appears calm and comfortable  Respiratory system: Rhonchi in the base. Respiratory effort normal. Cardiovascular system: S1 & S2 heard, RRR. No JVD, murmurs, rubs, gallops or clicks.  Gastrointestinal system: Abdomen is nondistended, soft and nontender. No organomegaly or masses felt. Normal bowel sounds heard. Central nervous system: Alert and oriented x3. No focal neurological  deficits. Extremities: Trace leg edema Skin: No rashes, lesions or ulcers Psychiatry: Judgement and insight appear normal. Mood & affect appropriate.     Data Reviewed: I have personally reviewed following labs and imaging studies  CBC: Recent Labs  Lab 11/22/20 0807 11/26/20 1645 11/27/20 0436  WBC 6.3 6.6 5.8  HGB 9.9* 9.0* 8.7*  HCT 30.5* 27.9* 25.9*  MCV 101.0* 101.8* 103.6*  PLT 130* 110* 71*   Basic Metabolic Panel: Recent Labs  Lab 11/22/20 0807 11/26/20 1536 11/27/20 0436  NA 135 136 135  K 4.6 5.5* 4.7  CL 103 107 104  CO2 21* 21* 21*  GLUCOSE 95 101* 85  BUN 44* 45* 44*  CREATININE 2.21* 2.02* 1.87*  CALCIUM 9.1 9.3 8.8*  MG  --  1.8  --    GFR: Estimated Creatinine Clearance: 33 mL/min (A) (by C-G formula based on SCr of 1.87 mg/dL (H)). Liver Function Tests: Recent Labs  Lab 11/22/20 0807 11/26/20 1536  AST 21 35  ALT 14 9  ALKPHOS 63 54  BILITOT 1.6* 1.6*  PROT 7.8 7.6  ALBUMIN 4.6 4.3   No results for input(s): LIPASE, AMYLASE in the last 168 hours. No results for input(s): AMMONIA in the last 168 hours. Coagulation Profile: Recent Labs  Lab 11/22/20 0807  INR 1.2   Cardiac Enzymes: No results for input(s): CKTOTAL, CKMB, CKMBINDEX, TROPONINI in the last 168 hours. BNP (last 3 results) No results for input(s): PROBNP in the last 8760 hours. HbA1C: No results for input(s): HGBA1C in the last 72 hours. CBG: Recent Labs  Lab 11/22/20 0825  GLUCAP 90   Lipid Profile: No results for input(s): CHOL, HDL, LDLCALC, TRIG, CHOLHDL, LDLDIRECT in the last 72 hours. Thyroid Function Tests: No results for input(s): TSH, T4TOTAL, FREET4, T3FREE, THYROIDAB in the last 72 hours. Anemia Panel: Recent Labs    11/27/20 0436  TIBC 244*  IRON 43*   Sepsis Labs: No results for input(s): PROCALCITON, LATICACIDVEN in the last 168 hours.  Recent Results (from the past 240 hour(s))  Resp Panel by RT-PCR (Flu A&B, Covid) Nasopharyngeal Swab      Status: None   Collection Time: 11/26/20  4:13 PM   Specimen: Nasopharyngeal Swab; Nasopharyngeal(NP) swabs in vial transport medium  Result Value Ref Range Status   SARS Coronavirus 2 by RT PCR NEGATIVE NEGATIVE Final    Comment: (NOTE) SARS-CoV-2 target nucleic acids are NOT DETECTED.  The SARS-CoV-2 RNA is generally detectable in upper respiratory specimens during the acute phase of infection. The lowest concentration of SARS-CoV-2 viral copies this assay can detect is 138 copies/mL. A negative result does not preclude SARS-Cov-2 infection and should not be used as the sole basis for treatment or other patient management decisions. A negative result may occur with  improper specimen collection/handling, submission of specimen other than nasopharyngeal swab, presence of viral mutation(s) within the areas targeted by this assay, and inadequate number of viral copies(<138 copies/mL). A negative result must be combined with clinical observations, patient history, and  epidemiological information. The expected result is Negative.  Fact Sheet for Patients:  EntrepreneurPulse.com.au  Fact Sheet for Healthcare Providers:  IncredibleEmployment.be  This test is no t yet approved or cleared by the [place] FDA and  has been authorized for detection and/or diagnosis of SARS-CoV-2 by FDA under an Emergency Use Authorization (EUA). This EUA will remain  in effect (meaning this test can be used) for the duration of the COVID-19 declaration under Section 564(b)(1) of the Act, 21 U.S.C.section 360bbb-3(b)(1), unless the authorization is terminated  or revoked sooner.       Influenza A by PCR NEGATIVE NEGATIVE Final   Influenza B by PCR NEGATIVE NEGATIVE Final    Comment: (NOTE) The Xpert Xpress SARS-CoV-2/FLU/RSV plus assay is intended as an aid in the diagnosis of influenza from Nasopharyngeal swab specimens and should not be used as a sole basis  for treatment. Nasal washings and aspirates are unacceptable for Xpert Xpress SARS-CoV-2/FLU/RSV testing.  Fact Sheet for Patients: EntrepreneurPulse.com.au  Fact Sheet for Healthcare Providers: IncredibleEmployment.be  This test is not yet approved or cleared by the [place] FDA and has been authorized for detection and/or diagnosis of SARS-CoV-2 by FDA under an Emergency Use Authorization (EUA). This EUA will remain in effect (meaning this test can be used) for the duration of the COVID-19 declaration under Section 564(b)(1) of the Act, 21 U.S.C. section 360bbb-3(b)(1), unless the authorization is terminated or revoked.  Performed at [facility], [address] [place], [place] [address]          Radiology Studies: DG Chest 2 View  Result Date: 11/26/2020 CLINICAL DATA:  Congestive heart failure.  Weight gain. EXAM: CHEST - 2 VIEW COMPARISON:  None. FINDINGS: Normal cardiac silhouette. Bilateral small effusions and basilar atelectasis similar prior. No pulmonary edema. IMPRESSION: Atelectasis and bibasilar effusions.  No pulmonary edema. Electronically Signed   By: Suzy Bouchard M.D.   On: 11/26/2020 15:38        Scheduled Meds:  atorvastatin  40 mg Oral QHS   cephALEXin  500 mg Oral TID   digoxin  0.0625 mg Oral Daily   enoxaparin (LOVENOX) injection  40 mg Subcutaneous Q24H   [START ON 11/28/2020] ferrous sulfate  325 mg Oral Q M,W,F   furosemide  40 mg Intravenous BID   metoprolol tartrate  12.5 mg Oral BID   ranolazine  500 mg Oral BID   sodium chloride flush  3 mL Intravenous Q12H   sucralfate  1 g Oral BID   tamsulosin  0.4 mg Oral QPC supper   Continuous Infusions:   LOS: 0 days    Time spent: 32 minutes, more than 50% time involved in direct patient care.    Sharen Hones, MD [facility]   To contact the attending provider between 7A-7P or the covering provider during after hours 7P-7A,  please log into the web site [email or web address] and access using universal [place] password for that web site. If you do not have the password, please call the hospital operator.  11/27/2020, 9:19 AM

## 2020-11-27 NOTE — TOC Initial Note (Addendum)
Transition of Care [facility]) - Initial/Assessment Note    Patient Details  Name: Andres Gutierrez MRN: [id number] Date of Birth: 11/27/1930  Transition of [facility]) CM/SW Contact:    Alberteen Sam, LCSW Phone Number: 11/27/2020, 11:51 AM  Clinical Narrative:                  Update: [facility] accepted referral, will follow for discharge.   CSW consulted for heart failure home health screen. Patient's daughter [name] at bedside. Both in agreement with Home Health services including RN, PT, aide and social work. Reports having [facility] in the past, they are preference as daughter [name] reports patient worked well with his RN and aide he had in the past. Patient currently lives home alone with family checking in daily. Patient has PCP Dr. Sabra Heck he follows up with regularly and reports no problems getting medications.   CSW has reached out to [facility] for referral for PT, RN, aide and social work. Pending acceptance at this time.  Patient reports no DME needs as he has a walker, tub bench, and 3in1 at home.    Expected Discharge Plan: [place] Barriers to Discharge: Continued Medical Work up   Patient Goals and CMS Choice Patient states their goals for this hospitalization and ongoing recovery are:: to go home CMS Medicare.gov Compare Post Acute Care list provided to:: Patient Represenative (must comment) (daughter [name] at bedside) Choice offered to / list presented to : Adult Children  Expected Discharge Plan and Services Expected Discharge Plan: [place] Choice: [place] arrangements for the past 2 months: Single Family Home                           HH Arranged: PT, OT, RN, Nurse's Aide, Social Work [employer] Agency: [place] ([place]) Date [place]: 11/27/20 Time [place]: 1151 Representative spoke with at [place]: [name]  Arrangements/Services Living arrangements for the past 2 months: [place] Lives with:: Self Patient language and need for interpreter reviewed:: Yes Do you feel safe going back to the place where you live?: Yes      Need for Family Participation in Patient Care: Yes (Comment) Care giver support system in place?: Yes (comment)   Criminal Activity/Legal Involvement Pertinent to Current Situation/Hospitalization: No - Comment as needed  Activities of Daily Living Home Assistive Devices/Equipment: Environmental consultant (specify type), Eyeglasses, Shower chair with back ADL Screening (condition at time of admission) Patient's cognitive ability adequate to safely complete daily activities?: Yes Is the patient deaf or have difficulty hearing?: Yes Does the patient have difficulty seeing, even when wearing glasses/contacts?: No Does the patient have difficulty concentrating, remembering, or making decisions?: No Patient able to express need for assistance with ADLs?: Yes Does the patient have difficulty dressing or bathing?: No Independently performs ADLs?: Yes (appropriate for developmental age) Does the patient have difficulty walking or climbing stairs?: Yes Weakness of Legs: None Weakness of Arms/Hands: None  Permission Sought/Granted Permission sought to share information with : Case Manager, Customer service manager, Family Supports Permission granted to share information with : Yes, Verbal Permission Granted  Share Information with NAME: [name]  Permission granted to share info w AGENCY: [place]  Permission granted to share info w Relationship: daughter  Permission granted to share info w Contact Information: [phone]  Emotional  Assessment Appearance:: Appears stated age Attitude/Demeanor/Rapport: Gracious Affect (typically observed): Calm Orientation: : Oriented to Self, Oriented to Place, Oriented to  Time, Oriented to Situation Alcohol / Substance Use: Not Applicable Psych  Involvement: No (comment)  Admission diagnosis:  Acute exacerbation of CHF (congestive heart failure) (HCC) [I50.9] Dyspnea, unspecified type [R06.00] Acute on chronic congestive heart failure, unspecified heart failure type ([place]) [I50.9] Acute on chronic combined systolic (congestive) and diastolic (congestive) heart failure (HCC) [I50.43] Patient Active Problem List   Diagnosis Date Noted   Acute on chronic combined systolic (congestive) and diastolic (congestive) heart failure (HCC) 11/27/2020   Acute exacerbation of CHF (congestive heart failure) ([place]) 11/26/2020   Hyperkalemia 11/26/2020   Acute renal failure superimposed on stage 3b chronic kidney disease ([place]) 11/26/2020   Hypomagnesemia 10/03/2020   Pleural effusion 10/03/2020   Chest pain 10/03/2020   GI bleed 08/12/2020   MDS (myelodysplastic syndrome), low grade ([place]) 07/22/2020   History of 2019 novel coronavirus disease (COVID-19) 05/19/2020   Cerebral amyloid angiopathy ([place]) 03/05/2020   Abdominal pain 03/03/2020   HLD (hyperlipidemia) 03/03/2020   Blurry vision 03/03/2020   CKD (chronic kidney disease), stage IIIb 03/03/2020   Left ureteral stone 03/03/2020   Generalized weakness    Cerebral hemorrhage, nontraumatic (HCC) 03/02/2020   Chronic cholecystitis 11/06/2019   Malnutrition of moderate degree 07/30/2019   Acute neutrophilia 07/30/2019   Sepsis ([place]) 07/29/2019   Acute cholecystitis 07/29/2019   Foot infection    Weakness    NSTEMI (non-ST elevated myocardial infarction) (HCC)    Orthostatic hypotension 01/07/2019   Symptomatic anemia 08/05/2018   Anemia of chronic kidney failure, stage 3 (moderate) ([place]) 08/05/2018   BPH (benign prostatic hyperplasia) 07/24/2018   Gastroesophageal reflux disease with esophagitis 07/24/2018   Thrombocytopenia ([place]) 07/24/2018   Primary osteoarthritis of both knees 08/22/2017   Medicare annual wellness visit, initial 70/48/8891   Chronic systolic CHF (congestive heart  failure), NYHA class 3 ([place]) 11/27/2016   Chewing tobacco use 01/17/2016   Dyspnea on exertion 12/23/2015   Elevated troponin 12/23/2015   Iron deficiency anemia due to chronic blood loss 12/20/2015   Femoral neck fracture, left, closed, initial encounter 11/03/2015   Chronic atrial fibrillation (HCC)    Coronary artery disease involving native coronary artery of native heart without angina pectoris    B12 deficiency 08/03/2015   Erosive esophagitis 08/03/2015   Adenopathy    CAD (coronary artery disease) 12/22/2014   HTN (hypertension) 12/22/2014   Cardiomyopathy, ischemic 07/27/2014   PCP:  Rusty Aus, MD Pharmacy:   [facility] [place], [place] [facility] [address] AT [address] [facility] [place] [address] Phone: [phone] Fax: [phone]     Social Determinants of Health (SDOH) Interventions    Readmission Risk Interventions Readmission Risk Prevention Plan 07/30/2019 06/08/2019  Transportation Screening Complete Complete  PCP or Specialist Appt within 3-5 Days Complete Complete  HRI or Home Care Consult Complete Complete  Social Work Consult for Recovery Care Planning/Counseling - Complete  Palliative Care Screening - Not Applicable  Medication Review Press photographer) Complete Complete  Some recent data might be hidden

## 2020-11-27 NOTE — Progress Notes (Signed)
Nurse checked patient vitals at [place].  Patient SPO2 was 89% on RA.  Patient audibly wheezing.  Patient placed on 2L/[place].  SPO2 was 95% on 2L/[place].

## 2020-11-27 NOTE — Evaluation (Signed)
Physical Therapy Evaluation Patient Details Name: Andres Gutierrez MRN: [id number] DOB: [birth year] Today's Date: 11/27/2020   History of Present Illness  Andres Gutierrez is a 87 y.o. male with medical history significant of CHF, CKD 3B, A. fib, BPH, CAD status post CABG, cerebral amyloid angiopathy, GERD, hyperlipidemia, hypertension, iron deficiency anemia, myelodysplastic syndrome who presents with ongoing increased dyspnea on exertion, edema, weight gain. Pt is being treated for acute on chronic combined systolic & diastolic congestive heart failure.  Clinical Impression  Cardiologist cleared pt for participation via secure chat. Pt received in room with daughter present & son arriving right before end of session. Daughter reports pt was fatigued after sitting on EOB for lunch earlier. Pt completes bed mobility & transfers with supervision, dons shoes with set up assist, and ambulates 15 ft in room + additional 100 ft in hallway with RW & CGA<>close supervision with pt requiring 2 standing rest breaks 2/2 fatigue. Due to pt living alone & pt performing below baseline, recommending STR upon d/c to maximize independence with functional mobility & reduce fall risk prior to return home.     Follow Up Recommendations SNF;Supervision for mobility/OOB    Equipment Recommendations  None recommended by PT    Recommendations for Other Services       Precautions / Restrictions Precautions Precautions: Fall Precaution Comments: pt with LLE surgical shoe (per daughter podiatrist wants him to wear that shoe, he visits podiatrists every 2-4 weeks for tx of calluses) Restrictions Weight Bearing Restrictions: No      Mobility  Bed Mobility Overal bed mobility: Needs Assistance Bed Mobility: Supine to Sit     Supine to sit: Supervision;HOB elevated          Transfers Overall transfer level: Needs assistance Equipment used: Rolling walker (2 wheeled) Transfers: Sit to/from Stand Sit to Stand:  Supervision            Ambulation/Gait Ambulation/Gait assistance: Min guard;Supervision Gait Distance (Feet): 12 Feet (+ 100 ft) Assistive device: Rolling walker (2 wheeled) Gait Pattern/deviations: Decreased step length - right;Decreased step length - left;Decreased stride length;Trunk flexed Gait velocity: decreased   General Gait Details: cuing to not push RW too far out in front & ambulate within base of AD; requires 2 standing rest breaks to ambulate 1 lap around pod  Stairs            Wheelchair Mobility    Modified Rankin (Stroke Patients Only)       Balance Overall balance assessment: Needs assistance Sitting-balance support: Feet supported;Bilateral upper extremity supported Sitting balance-[name]: Good     Standing balance support: During functional activity;Bilateral upper extremity supported Standing balance-[name]: Fair Standing balance comment: requires UE support on RW                             Pertinent Vitals/Pain Pain Assessment: No/denies pain    Home Living Family/patient expects to be discharged to:: Private residence Living Arrangements: Alone Available Help at Discharge: Family;Available PRN/intermittently Type of Home: House Home Access: Stairs to enter Entrance Stairs-Rails: Right;Left;Can reach both Entrance Stairs-Number of Steps: 2 Home Layout: One level Home Equipment: Walker - 2 wheels;Cane - single point;Bedside commode;Shower seat;Grab bars - tub/shower      Prior Function Level of Independence: Independent with assistive device(s)         Comments: Mod I with RW, doesn't drive (children assist with that), bathes, dresses, no falls in past  6 months     Hand Dominance   Dominant Hand: Right    Extremity/Trunk Assessment   Upper Extremity Assessment Upper Extremity Assessment: Generalized weakness    Lower Extremity Assessment Lower Extremity Assessment: Generalized weakness     Cervical / Trunk Assessment Cervical / Trunk Assessment: Kyphotic  Communication   Communication: HOH  Cognition Arousal/Alertness: Awake/alert Behavior During Therapy: WFL for tasks assessed/performed Overall Cognitive Status: Within Functional Limits for tasks assessed                                 General Comments: pleasant gentleman      General Comments General comments (skin integrity, edema, etc.): Pt on 2.5L/min via nasal cannula at rest, 3L/min with mobility, SpO2 >90%, educated pt on pursed lip breathing, HR 62 bpm    Exercises     Assessment/Plan    PT Assessment Patient needs continued PT services  PT Problem List Decreased strength;Decreased mobility;Decreased activity tolerance;Decreased balance;Cardiopulmonary status limiting activity       PT Treatment Interventions DME instruction;Therapeutic activities;Gait training;Therapeutic exercise;Patient/family education;Stair training;Balance training;Functional mobility training;Neuromuscular re-education    PT Goals (Current goals can be found in the Care Plan section)  Acute Rehab PT Goals Patient Stated Goal: get better, go to rehab PT Goal Formulation: With patient/family Time For Goal Achievement: 12/11/20 Potential to Achieve Goals: Good    Frequency Min 2X/week   Barriers to discharge Inaccessible home environment;Decreased caregiver support lives alone with 2 STE home    Co-evaluation               AM-PAC PT "6 Clicks" Mobility  Outcome Measure Help needed turning from your back to your side while in a flat bed without using bedrails?: None Help needed moving from lying on your back to sitting on the side of a flat bed without using bedrails?: A Little Help needed moving to and from a bed to a chair (including a wheelchair)?: A Little Help needed standing up from a chair using your arms (e.g., wheelchair or bedside chair)?: A Little Help needed to walk in hospital room?: A  Little Help needed climbing 3-5 steps with a railing? : A Little 6 Click Score: 19    End of Session Equipment Utilized During Treatment: Oxygen Activity Tolerance: Patient tolerated treatment well Patient left: in chair;with call bell/phone within reach;with chair alarm set;with family/visitor present Nurse Communication: Mobility status PT Visit Diagnosis: Muscle weakness (generalized) (M62.81);Difficulty in walking, not elsewhere classified (R26.2)    Time: 7001-7494 PT Time Calculation (min) (ACUTE ONLY): 27 min   Charges:   PT Evaluation $PT Eval Moderate Complexity: 1 Mod PT Treatments $Therapeutic Activity: 8-22 mins        Lavone Nian, PT, DPT 11/27/20, 2:18 PM   Waunita Schooner 11/27/2020, 2:17 PM

## 2020-11-27 NOTE — TOC Progression Note (Signed)
Transition of Care [facility]) - Progression Note    Patient Details  Name: Andres Gutierrez MRN: [id number] Date of Birth: Jan 21, 1931  Transition of Care [facility]) CM/SW [place], [place] Phone Number: 11/27/2020, 3:34 PM  Clinical Narrative:     CSW spoke with patient's daughter [name] following PT evaluation and recommendation of SNF at discharge. She reports being in agreement with [facility] as preference. [name] reports she would like [facility] to provide services after patient comes home from rehab, CSW has informed Corene Cornea with [facility].   CSW has faxed out SNF referrals pending bed offers at this time.     Expected Discharge Plan: Skilled Nursing Facility Barriers to Discharge: Continued Medical Work up  Expected Discharge Plan and Services Expected Discharge Plan: [place] Choice: [place] arrangements for the past 2 months: Single Family Home                                       Social Determinants of Health (SDOH) Interventions    Readmission Risk Interventions Readmission Risk Prevention Plan 07/30/2019 06/08/2019  Transportation Screening Complete Complete  PCP or Specialist Appt within 3-5 Days Complete Complete  HRI or Home Care Consult Complete Complete  Social Work Consult for [place] Planning/Counseling - Complete  Palliative Care Screening - Not Applicable  Medication Review Press photographer) Complete Complete  Some recent data might be hidden

## 2020-11-27 NOTE — Plan of Care (Signed)

## 2020-11-28 DIAGNOSIS — N179 Acute kidney failure, unspecified: Secondary | ICD-10-CM | POA: Diagnosis not present

## 2020-11-28 DIAGNOSIS — N1832 Chronic kidney disease, stage 3b: Secondary | ICD-10-CM | POA: Diagnosis not present

## 2020-11-28 DIAGNOSIS — I5043 Acute on chronic combined systolic (congestive) and diastolic (congestive) heart failure: Secondary | ICD-10-CM | POA: Diagnosis not present

## 2020-11-28 LAB — CBC WITH DIFFERENTIAL/PLATELET
Abs Immature Granulocytes: 0.12 10*3/uL — ABNORMAL HIGH (ref 0.00–0.07)
Basophils Absolute: 0 10*3/uL (ref 0.0–0.1)
Basophils Relative: 0 %
Eosinophils Absolute: 0 10*3/uL (ref 0.0–0.5)
Eosinophils Relative: 1 %
HCT: 26 % — ABNORMAL LOW (ref 39.0–52.0)
Hemoglobin: 8.6 g/dL — ABNORMAL LOW (ref 13.0–17.0)
Immature Granulocytes: 2 %
Lymphocytes Relative: 28 %
Lymphs Abs: 1.5 10*3/uL (ref 0.7–4.0)
MCH: 33.3 pg (ref 26.0–34.0)
MCHC: 33.1 g/dL (ref 30.0–36.0)
MCV: 100.8 fL — ABNORMAL HIGH (ref 80.0–100.0)
Monocytes Absolute: 2 10*3/uL — ABNORMAL HIGH (ref 0.1–1.0)
Monocytes Relative: 37 %
Neutro Abs: 1.8 10*3/uL (ref 1.7–7.7)
Neutrophils Relative %: 32 %
Platelets: 88 10*3/uL — ABNORMAL LOW (ref 150–400)
RBC: 2.58 MIL/uL — ABNORMAL LOW (ref 4.22–5.81)
RDW: 19.8 % — ABNORMAL HIGH (ref 11.5–15.5)
Smear Review: NORMAL
WBC: 5.5 10*3/uL (ref 4.0–10.5)
nRBC: 0 % (ref 0.0–0.2)

## 2020-11-28 LAB — BASIC METABOLIC PANEL
Anion gap: 7 (ref 5–15)
BUN: 46 mg/dL — ABNORMAL HIGH (ref 8–23)
CO2: 24 mmol/L (ref 22–32)
Calcium: 8.9 mg/dL (ref 8.9–10.3)
Chloride: 104 mmol/L (ref 98–111)
Creatinine, Ser: 2.08 mg/dL — ABNORMAL HIGH (ref 0.61–1.24)
GFR, Estimated: 30 mL/min — ABNORMAL LOW (ref >60)
Glucose, Bld: 83 mg/dL (ref 70–99)
Potassium: 4.1 mmol/L (ref 3.5–5.1)
Sodium: 135 mmol/L (ref 135–145)

## 2020-11-28 LAB — MAGNESIUM: Magnesium: 1.5 mg/dL — ABNORMAL LOW (ref 1.7–2.4)

## 2020-11-28 MED ORDER — TORSEMIDE 20 MG PO TABS
40.0000 mg | ORAL_TABLET | Freq: Two times a day (BID) | ORAL | Status: DC
  Administered 2020-11-28: 40 mg via ORAL
  Filled 2020-11-28: qty 2

## 2020-11-28 MED ORDER — MAGNESIUM SULFATE 2 GM/50ML IV SOLN
2.0000 g | Freq: Once | INTRAVENOUS | Status: AC
  Administered 2020-11-28: 2 g via INTRAVENOUS
  Filled 2020-11-28: qty 50

## 2020-11-28 MED ORDER — CEPHALEXIN 500 MG PO CAPS
500.0000 mg | ORAL_CAPSULE | Freq: Two times a day (BID) | ORAL | Status: DC
  Administered 2020-11-28 – 2020-12-02 (×8): 500 mg via ORAL
  Filled 2020-11-28 (×8): qty 1

## 2020-11-28 NOTE — Progress Notes (Signed)
Physical Therapy Treatment Patient Details Name: Andres Gutierrez MRN: [id number] DOB: 02-11-1931 Today's Date: 11/28/2020    History of Present Illness Andres Gutierrez is a 87 y.o. male with medical history significant of CHF, CKD 3B, A. fib, BPH, CAD status post CABG, cerebral amyloid angiopathy, GERD, hyperlipidemia, hypertension, iron deficiency anemia, myelodysplastic syndrome who presents with ongoing increased dyspnea on exertion, edema, weight gain. Pt is being treated for acute on chronic combined systolic & diastolic congestive heart failure.    PT Comments    Pt seen for PT tx with pt very pleasant & agreeable to tx. Pt dons shoes (RLE shoe & LLE surgical shoe) with set up assist while sitting EOB. Pt is able to complete sit<>stand with mod I and ambulate 2 laps around nursing pod with RW & supervision with decreased gait speed, cuing & improving demonstration of proper use of AD, and without standing rest break during gait but does require seated rest break between each bout. Will continue to follow pt acutely to progress endurance as able.     Follow Up Recommendations  SNF;Supervision for mobility/OOB     Equipment Recommendations  None recommended by PT    Recommendations for Other Services       Precautions / Restrictions Precautions Precautions: Fall Precaution Comments: pt with LLE surgical shoe (per daughter podiatrist wants him to wear that shoe, he visits podiatrists every 2-4 weeks for tx of calluses) Restrictions Weight Bearing Restrictions: No    Mobility  Bed Mobility Overal bed mobility: Modified Independent Bed Mobility: Supine to Sit     Supine to sit: Modified independent (Device/Increase time)          Transfers Overall transfer level: Modified independent Equipment used: Rolling walker (2 wheeled) Transfers: Sit to/from Stand Sit to Stand: Modified independent (Device/Increase time)         General transfer comment: good awareness of safe  hand placement  Ambulation/Gait Ambulation/Gait assistance: Supervision Gait Distance (Feet): 100 Feet (+ 100 ft) Assistive device: Rolling walker (2 wheeled) Gait Pattern/deviations: Decreased step length - right;Decreased step length - left;Decreased stride length;Trunk flexed Gait velocity: decreased   General Gait Details: cuing to stand within base of AD vs pushing it out in front of him with pt demonstrating good improvement   Stairs             Wheelchair Mobility    Modified Rankin (Stroke Patients Only)       Balance Overall balance assessment: Needs assistance Sitting-balance support: Feet supported;Bilateral upper extremity supported Sitting balance-[name]: Good     Standing balance support: During functional activity;Bilateral upper extremity supported Standing balance-[name]: Fair Standing balance comment: requires UE support on RW                            Cognition Arousal/Alertness: Awake/alert Behavior During Therapy: WFL for tasks assessed/performed Overall Cognitive Status: Within Functional Limits for tasks assessed                                 General Comments: pleasant gentleman      Exercises      General Comments General comments (skin integrity, edema, etc.): Pt on 2L/min via nasal cannula, SpO2 90% or > during session, briefly reviewed pursed lip breathing      Pertinent Vitals/Pain Pain Assessment: No/denies pain    Home Living  Prior Function            PT Goals (current goals can now be found in the care plan section) Acute Rehab PT Goals Patient Stated Goal: get better, go to rehab PT Goal Formulation: With patient/family Time For Goal Achievement: 12/11/20 Potential to Achieve Goals: Good Progress towards PT goals: Progressing toward goals    Frequency    Min 2X/week      PT Plan Current plan remains appropriate    Co-evaluation               AM-PAC PT "6 Clicks" Mobility   Outcome Measure  Help needed turning from your back to your side while in a flat bed without using bedrails?: None Help needed moving from lying on your back to sitting on the side of a flat bed without using bedrails?: None Help needed moving to and from a bed to a chair (including a wheelchair)?: A Little Help needed standing up from a chair using your arms (e.g., wheelchair or bedside chair)?: None Help needed to walk in hospital room?: A Little Help needed climbing 3-5 steps with a railing? : A Little 6 Click Score: 21    End of Session Equipment Utilized During Treatment: Oxygen;Gait belt Activity Tolerance: Patient tolerated treatment well Patient left: in chair;with chair alarm set;with call bell/phone within reach   PT Visit Diagnosis: Muscle weakness (generalized) (M62.81);Difficulty in walking, not elsewhere classified (R26.2)     Time: 6010-9323 PT Time Calculation (min) (ACUTE ONLY): 18 min  Charges:  $Therapeutic Activity: 8-22 mins                     Lavone Nian, PT, DPT 11/28/20, 12:14 PM    Waunita Schooner 11/28/2020, 12:05 PM

## 2020-11-28 NOTE — Progress Notes (Signed)
[facility] Cardiology    SUBJECTIVE: Patient still feels relatively weak some shortness of breath but denies any pain resting comfortably in bed   Vitals:   11/28/20 0633 11/28/20 0718 11/28/20 1052 11/28/20 1516  BP:  109/61 (!) 100/49 102/66  Pulse:  61 62 61  Resp:  16 17 17   Temp:  98.2 F (36.8 C) 98.2 F (36.8 C) 98.1 F (36.7 C)  TempSrc:      SpO2:  97% 99% 99%  Weight: 80.4 kg     Height:         Intake/Output Summary (Last 24 hours) at 11/28/2020 1725 Last data filed at 11/28/2020 1705 Gross per 24 hour  Intake 1322.75 ml  Output 750 ml  Net 572.75 ml      PHYSICAL EXAM  General: Well developed, well nourished, in no acute distress HEENT:  Normocephalic and atramatic Neck:  No JVD.  Lungs: Clear bilaterally to auscultation and percussion. Heart: Irregular irregular normal S1 and S2 without gallops or murmurs.  Abdomen: Bowel sounds are positive, abdomen soft and non-tender  Msk:  Back normal, normal gait. Normal strength and tone for age. Extremities: No clubbing, cyanosis or edema.   Neuro: Alert and oriented X 3. Psych:  Good affect, responds appropriately   LABS: Basic Metabolic Panel: Recent Labs    11/26/20 1536 11/27/20 0436 11/28/20 0412  NA 136 135 135  K 5.5* 4.7 4.1  CL 107 104 104  CO2 21* 21* 24  GLUCOSE 101* 85 83  BUN 45* 44* 46*  CREATININE 2.02* 1.87* 2.08*  CALCIUM 9.3 8.8* 8.9  MG 1.8  --  1.5*   Liver Function Tests: Recent Labs    11/26/20 1536  AST 35  ALT 9  ALKPHOS 54  BILITOT 1.6*  PROT 7.6  ALBUMIN 4.3   No results for input(s): LIPASE, AMYLASE in the last 72 hours. CBC: Recent Labs    11/27/20 0436 11/28/20 0412  WBC 5.8 5.5  NEUTROABS  --  1.8  HGB 8.7* 8.6*  HCT 25.9* 26.0*  MCV 103.6* 100.8*  PLT 71* 88*   Cardiac Enzymes: No results for input(s): CKTOTAL, CKMB, CKMBINDEX, TROPONINI in the last 72 hours. BNP: Invalid input(s): POCBNP D-Dimer: No results for input(s): DDIMER in the last 72  hours. Hemoglobin A1C: No results for input(s): HGBA1C in the last 72 hours. Fasting Lipid Panel: No results for input(s): CHOL, HDL, LDLCALC, TRIG, CHOLHDL, LDLDIRECT in the last 72 hours. Thyroid Function Tests: No results for input(s): TSH, T4TOTAL, T3FREE, THYROIDAB in the last 72 hours.  Invalid input(s): FREET3 Anemia Panel: Recent Labs    11/26/20 2055 11/27/20 0436  VITAMINB12 2,117*  --   TIBC  --  244*  IRON  --  43*    No results found.   Echo moderate to severely depressed left ventricular function ejection fraction between 25 and 30%  TELEMETRY: Atrial fibrillation rate of 60 nonspecific T2 changes  ASSESSMENT AND PLAN:  Principal Problem:   Acute exacerbation of CHF (congestive heart failure) (HCC) Active Problems:   CAD (coronary artery disease)   HTN (hypertension)   Chronic atrial fibrillation (HCC)   Dyspnea on exertion   BPH (benign prostatic hyperplasia)   Gastroesophageal reflux disease with esophagitis   Thrombocytopenia (HCC)   HLD (hyperlipidemia)   MDS (myelodysplastic syndrome), low grade (HCC)   Hyperkalemia   Acute renal failure superimposed on stage 3b chronic kidney disease (HCC)   Acute on chronic combined systolic (congestive) and diastolic (congestive)  heart failure (HCC)    Plan  Continue anticoagulation atrial fibrillation transition to Eliquis Inhalers as necessary for COPD supplemental oxygen as necessary History of acute on chronic systolic congestive heart failure continue diuretics avoid ACE or ARB because of renal insufficiency consider Imdur hydralazine Moderate severe cardiomyopathy ejection fraction 25 to 30% continue aggressive therapy for heart failure congestive heart failure cardiomyopathy Maintain Lipitor therapy for lipid management Agree with nephrology input for acute on chronic renal insufficiency Noninvasive procedures are recommended because of significant renal insufficiency    Yolonda Kida,  MD 11/28/2020 5:25 PM

## 2020-11-28 NOTE — TOC Progression Note (Signed)
Transition of Care [facility]) - Progression Note    Patient Details  Name: Andres Gutierrez MRN: [id number] Date of Birth: 04-Jul-1930  [facility] CM/SW Contact  Beverly Sessions, RN Phone Number: 11/28/2020, 4:58 PM  Clinical Narrative:     Spoke with daughter.  She confirms the would like to accept  bed [facility] Notified Ricky at [facility] he can accept patent tomorrow pending auth. And patient will go to room E8  Spoke with Colletta Maryland at [facility] and she has started facility auth Patient ambulating 200 feet so family will need to transport or patient will need to utilize  [facility] as he does not meet medical need for EMS transport  Requested for MD to order repeat covid test  Expected Discharge Plan: Skilled Nursing Facility Barriers to Discharge: Continued Medical Work up  Expected Discharge Plan and Services Expected Discharge Plan: [place] Choice: [place] arrangements for the past 2 months: Single Family Home                                       Social Determinants of Health (SDOH) Interventions    Readmission Risk Interventions Readmission Risk Prevention Plan 07/30/2019 06/08/2019  Transportation Screening Complete Complete  PCP or Specialist Appt within 3-5 Days Complete Complete  HRI or Home Care Consult Complete Complete  Social Work Consult for [place] Planning/Counseling - Complete  Palliative Care Screening - Not Applicable  Medication Review Press photographer) Complete Complete  Some recent data might be hidden

## 2020-11-28 NOTE — Progress Notes (Signed)
PHARMACY NOTE:  ANTIMICROBIAL RENAL DOSAGE ADJUSTMENT  Current antimicrobial regimen includes a mismatch between antimicrobial dosage and estimated renal function.  As per policy approved by the Pharmacy & Therapeutics and Medical Executive Committees, the antimicrobial dosage will be adjusted accordingly.  Current antimicrobial dosage:  Keflex 500 mg TID  Indication: Eyelid stye  Renal Function:  Estimated Creatinine Clearance: 28.5 mL/min (A) (by C-G formula based on SCr of 2.08 mg/dL (H)).    Antimicrobial dosage has been changed to:  Keflex 500 mg BID  Thank you for allowing pharmacy to be a part of this patient's care.  Benita Gutter, [facility] 11/28/2020 12:29 PM

## 2020-11-28 NOTE — Consult Note (Signed)
   Heart Failure Nurse Navigator Note  HFrEF 25 -30%   Presented with complaints of worsening shortness of breath, lower extremity edema and 5 pound weight gain.    Comorbidities:  Atrial fibrillation Coronary artery disease Hypertension Hyperlipidemia Anxiety  Labs:  Sodium 135, potassium 4.1, chloride 104, CO2 24, BUN 46, creatinine 2.08 up from 1.87 yesterday, magnesium 1.5.  Weight 80.4 kg down from 83.9 of yesterday. Intake 1140 mL Output 1225 mL  Medications:  Atorvastatin 40 mg at bedtime Digoxin 0.0625 mg daily Furosemide 40 mg IV 2 times a day Hydralazine 25 mg 2 times a day Isosorbide mononitrate 30 mg daily Metoprolol tartrate 12-1/2 mg 2 times a day Ranexa 500 mg 2 times a day  Assessment:  General-she is awake and alert lying flat in bed in no acute distress.  HEENT-no JVD, poor dentition.  Cardiac-heart tones of regular rate and rhythm.  Chest-breath sounds are clear to posterior auscultation  Musculoskeletal-there is no lower extremity edema.  Psych-is pleasant and appropriate  Neuro speech is clear moves all extremities without difficulty.   Met with patient today.  He had last been discharged from the hospital on May 24.  He lives at home alone but his daughter Andres Gutierrez is within 2 blocks and brings him frequent meals.  States that he had been weighing himself daily and did not feel that this hospitalization was due to any dietary indiscretions or increased fluid intake.  He states that his weight between Thursday and Saturday had gone up 5 pounds and with his symptoms decided to come to the emergency room.  He states that he is being discharged to an SNF for strengthening and is looking forward to it.  With his creatinine level going up hospitalist plan to change Lasix to torsemide and observe him for 1 more day.     RN CHFN 

## 2020-11-28 NOTE — Progress Notes (Addendum)
PROGRESS NOTE    Andres Gutierrez  FOY:[id number] DOB: 05/29/1930 DOA: 11/26/2020 PCP: Rusty Aus, MD   Chief complaint.  Shortness of breath. Brief Narrative:  Andres Gutierrez is a 87 y.o. male with medical history significant of CHF, CKD 3B, A. fib, BPH, CAD status post CABG, cerebral amyloid angiopathy, GERD, hyperlipidemia, hypertension, iron deficiency anemia, myelodysplastic syndrome who presents with ongoing increased dyspnea on exertion, edema, weight gain. Echocardiogram performed in May 2022 showed ejection fraction 25 to 30%.  He is started on IV Lasix   Assessment & Plan:   Principal Problem:   Acute exacerbation of CHF (congestive heart failure) (HCC) Active Problems:   CAD (coronary artery disease)   HTN (hypertension)   Chronic atrial fibrillation (HCC)   Dyspnea on exertion   BPH (benign prostatic hyperplasia)   Gastroesophageal reflux disease with esophagitis   Thrombocytopenia (HCC)   HLD (hyperlipidemia)   MDS (myelodysplastic syndrome), low grade (HCC)   Hyperkalemia   Acute renal failure superimposed on stage 3b chronic kidney disease (HCC)   Acute on chronic combined systolic (congestive) and diastolic (congestive) heart failure ([place])  #1 acute on chronic combined systolic and diastolic congestive heart failure. Elevation BNP secondary to congestive heart failure. Patient volume status much better today, creatinine going up, I will change Lasix to torsemide oral. Patient has been seen by cardiology. Patient has significant weakness, physical therapy has recommended nursing home placement.  2.  Acute kidney injury on chronic kidney disease stage IIIb. Hyperkalemia Potassium have normalized, renal function was better initially after diuretics, now is higher again.  Discontinue IV Lasix.   Will need to follow renal function for the last 24 hours before patient can be discharged.  3.  Coronary disease with status post CABG. Continue current treatment  #4.   Anemia. Thrombocytopenia. Myelodysplastic syndrome. Patient does not have iron and B12 deficiency.  Anemia appears to be secondary to myelodysplastic syndrome.    DVT prophylaxis: SCDs Code Status: full Family Communication:  Disposition Plan:    Status is: Inpatient  Remains inpatient appropriate because:Inpatient level of care appropriate due to severity of illness  Dispo: The patient is from: Home              Anticipated d/c is to: SNF              Patient currently is not medically stable to d/c.   Difficult to place patient No        I/O last 3 completed shifts: In: 1320 [P.O.:1320] Out: 1775 [Urine:1775] Total I/O In: 402.8 [P.O.:360; IV Piggyback:42.8] Out: -      Consultants:  Cardiology  Procedures: None  Antimicrobials: None  Subjective: Patient has significant weakness, short of breath with exertion. Denies any abdominal pain or nausea vomiting. No dysuria hematuria. No fever or chills. No chest pain or palpitation.  Objective: Vitals:   11/28/20 0505 11/28/20 0633 11/28/20 0718 11/28/20 1052  BP: (!) 102/54  109/61 (!) 100/49  Pulse: 66  61 62  Resp: 19  16 17   Temp: 97.8 F (36.6 C)  98.2 F (36.8 C) 98.2 F (36.8 C)  TempSrc: Oral     SpO2:   97% 99%  Weight:  80.4 kg    Height:        Intake/Output Summary (Last 24 hours) at 11/28/2020 1138 Last data filed at 11/28/2020 1134 Gross per 24 hour  Intake 1182.75 ml  Output 950 ml  Net 232.75 ml   [employer]  11/26/20 2047 11/27/20 0215 11/28/20 0633  Weight: 83.9 kg 83.9 kg 80.4 kg    Examination:  General exam: Appears calm and comfortable  Respiratory system: Clear to auscultation. Respiratory effort normal. Cardiovascular system: S1 & S2 heard, RRR. No JVD, murmurs, rubs, gallops or clicks. No pedal edema. Gastrointestinal system: Abdomen is nondistended, soft and nontender. No organomegaly or masses felt. Normal bowel sounds heard. Central nervous system: Alert and  oriented x2. No focal neurological deficits. Extremities: Symmetric 5 x 5 power. Skin: No rashes, lesions or ulcers Psychiatry: Mood & affect appropriate.     Data Reviewed: I have personally reviewed following labs and imaging studies  CBC: Recent Labs  Lab 11/22/20 0807 11/26/20 1645 11/27/20 0436 11/28/20 0412  WBC 6.3 6.6 5.8 5.5  NEUTROABS  --   --   --  PENDING  HGB 9.9* 9.0* 8.7* 8.6*  HCT 30.5* 27.9* 25.9* 26.0*  MCV 101.0* 101.8* 103.6* 100.8*  PLT 130* 110* 71* 88*   Basic Metabolic Panel: Recent Labs  Lab 11/22/20 0807 11/26/20 1536 11/27/20 0436 11/28/20 0412  NA 135 136 135 135  K 4.6 5.5* 4.7 4.1  CL 103 107 104 104  CO2 21* 21* 21* 24  GLUCOSE 95 101* 85 83  BUN 44* 45* 44* 46*  CREATININE 2.21* 2.02* 1.87* 2.08*  CALCIUM 9.1 9.3 8.8* 8.9  MG  --  1.8  --  1.5*   GFR: Estimated Creatinine Clearance: 28.5 mL/min (A) (by C-G formula based on SCr of 2.08 mg/dL (H)). Liver Function Tests: Recent Labs  Lab 11/22/20 0807 11/26/20 1536  AST 21 35  ALT 14 9  ALKPHOS 63 54  BILITOT 1.6* 1.6*  PROT 7.8 7.6  ALBUMIN 4.6 4.3   No results for input(s): LIPASE, AMYLASE in the last 168 hours. No results for input(s): AMMONIA in the last 168 hours. Coagulation Profile: Recent Labs  Lab 11/22/20 0807  INR 1.2   Cardiac Enzymes: No results for input(s): CKTOTAL, CKMB, CKMBINDEX, TROPONINI in the last 168 hours. BNP (last 3 results) No results for input(s): PROBNP in the last 8760 hours. HbA1C: No results for input(s): HGBA1C in the last 72 hours. CBG: Recent Labs  Lab 11/22/20 0825  GLUCAP 90   Lipid Profile: No results for input(s): CHOL, HDL, LDLCALC, TRIG, CHOLHDL, LDLDIRECT in the last 72 hours. Thyroid Function Tests: No results for input(s): TSH, T4TOTAL, FREET4, T3FREE, THYROIDAB in the last 72 hours. Anemia Panel: Recent Labs    11/26/20 2055 11/27/20 0436  VITAMINB12 2,117*  --   TIBC  --  244*  IRON  --  43*   Sepsis  Labs: No results for input(s): PROCALCITON, LATICACIDVEN in the last 168 hours.  Recent Results (from the past 240 hour(s))  Resp Panel by RT-PCR (Flu A&B, Covid) Nasopharyngeal Swab     Status: None   Collection Time: 11/26/20  4:13 PM   Specimen: Nasopharyngeal Swab; Nasopharyngeal(NP) swabs in vial transport medium  Result Value Ref Range Status   SARS Coronavirus 2 by RT PCR NEGATIVE NEGATIVE Final    Comment: (NOTE) SARS-CoV-2 target nucleic acids are NOT DETECTED.  The SARS-CoV-2 RNA is generally detectable in upper respiratory specimens during the acute phase of infection. The lowest concentration of SARS-CoV-2 viral copies this assay can detect is 138 copies/mL. A negative result does not preclude SARS-Cov-2 infection and should not be used as the sole basis for treatment or other patient management decisions. A negative result may occur with  improper specimen  collection/handling, submission of specimen other than nasopharyngeal swab, presence of viral mutation(s) within the areas targeted by this assay, and inadequate number of viral copies(<138 copies/mL). A negative result must be combined with clinical observations, patient history, and epidemiological information. The expected result is Negative.  Fact Sheet for Patients:  EntrepreneurPulse.com.au  Fact Sheet for Healthcare Providers:  IncredibleEmployment.be  This test is no t yet approved or cleared by the [place] FDA and  has been authorized for detection and/or diagnosis of SARS-CoV-2 by FDA under an Emergency Use Authorization (EUA). This EUA will remain  in effect (meaning this test can be used) for the duration of the COVID-19 declaration under Section 564(b)(1) of the Act, 21 U.S.C.section 360bbb-3(b)(1), unless the authorization is terminated  or revoked sooner.       Influenza A by PCR NEGATIVE NEGATIVE Final   Influenza B by PCR NEGATIVE NEGATIVE Final     Comment: (NOTE) The Xpert Xpress SARS-CoV-2/FLU/RSV plus assay is intended as an aid in the diagnosis of influenza from Nasopharyngeal swab specimens and should not be used as a sole basis for treatment. Nasal washings and aspirates are unacceptable for Xpert Xpress SARS-CoV-2/FLU/RSV testing.  Fact Sheet for Patients: EntrepreneurPulse.com.au  Fact Sheet for Healthcare Providers: IncredibleEmployment.be  This test is not yet approved or cleared by the [place] FDA and has been authorized for detection and/or diagnosis of SARS-CoV-2 by FDA under an Emergency Use Authorization (EUA). This EUA will remain in effect (meaning this test can be used) for the duration of the COVID-19 declaration under Section 564(b)(1) of the Act, 21 U.S.C. section 360bbb-3(b)(1), unless the authorization is terminated or revoked.  Performed at [facility], [address] [place], [place] [address]          Radiology Studies: DG Chest 2 View  Result Date: 11/26/2020 CLINICAL DATA:  Congestive heart failure.  Weight gain. EXAM: CHEST - 2 VIEW COMPARISON:  None. FINDINGS: Normal cardiac silhouette. Bilateral small effusions and basilar atelectasis similar prior. No pulmonary edema. IMPRESSION: Atelectasis and bibasilar effusions.  No pulmonary edema. Electronically Signed   By: Suzy Bouchard M.D.   On: 11/26/2020 15:38        Scheduled Meds:  atorvastatin  40 mg Oral QHS   cephALEXin  500 mg Oral TID   digoxin  0.0625 mg Oral Daily   ferrous sulfate  325 mg Oral Q M,W,F   hydrALAZINE  25 mg Oral BID   isosorbide mononitrate  30 mg Oral Daily   metoprolol tartrate  12.5 mg Oral BID   ranolazine  500 mg Oral BID   sodium chloride flush  3 mL Intravenous Q12H   sucralfate  1 g Oral BID   tamsulosin  0.4 mg Oral QPC supper   torsemide  40 mg Oral BID   Continuous Infusions:   LOS: 1 day    Time spent: 27 minutes    Sharen Hones,  MD [facility]   To contact the attending provider between 7A-7P or the covering provider during after hours 7P-7A, please log into the web site [email or web address] and access using universal [place] password for that web site. If you do not have the password, please call the hospital operator.  11/28/2020, 11:38 AM

## 2020-11-28 NOTE — Evaluation (Signed)
Occupational Therapy Evaluation Patient Details Name: Andres Gutierrez MRN: [id number] DOB: 1931/03/18 Today's Date: 11/28/2020    History of Present Illness Andres Gutierrez is a 87 y.o. male with medical history significant of CHF, CKD 3B, A. fib, BPH, CAD status post CABG, cerebral amyloid angiopathy, GERD, hyperlipidemia, hypertension, iron deficiency anemia, myelodysplastic syndrome who presents with ongoing increased dyspnea on exertion, edema, weight gain. Pt is being treated for acute on chronic combined systolic & diastolic congestive heart failure.   Clinical Impression   Patient presenting with decreased I in self care, balance, functional mobility/transfers, endurance, and safety awareness.Patient reports living at home alone with use of RW for safety PTA. Pt performs all self care and IADL tasks independently. He does not drive and his daughter assists him with appointments and getting groceries. Patient currently functioning at supervision - min A. Pt with the most difficulty managing RW in tight spaces this session when ambulating to sink for grooming needs.Patient will benefit from acute OT to increase overall independence in the areas of ADLs, functional mobility, and safety awareness in order to safely discharge to next venue of care.     Follow Up Recommendations  SNF    Equipment Recommendations  Other (comment) (defer to next venue of care)       Precautions / Restrictions Precautions Precautions: Fall Precaution Comments: pt with LLE surgical shoe (per daughter podiatrist wants him to wear that shoe, he visits podiatrists every 2-4 weeks for tx of calluses) Restrictions Weight Bearing Restrictions: No      Mobility Bed Mobility Overal bed mobility: Needs Assistance Bed Mobility: Sit to Supine     Supine to sit: Modified independent (Device/Increase time) Sit to supine: Supervision   General bed mobility comments: min cuing for safety    Transfers Overall transfer  level: Modified independent Equipment used: Rolling walker (2 wheeled) Transfers: Sit to/from Stand Sit to Stand: Supervision         General transfer comment: from recliner chair    Balance Overall balance assessment: Needs assistance Sitting-balance support: Feet supported;Bilateral upper extremity supported Sitting balance-[name]: Good     Standing balance support: During functional activity;Bilateral upper extremity supported Standing balance-[name]: Fair Standing balance comment: requires UE support on RW                           ADL either performed or assessed with clinical judgement   ADL Overall ADL's : Needs assistance/impaired     Grooming: Wash/dry hands;Wash/dry face;Oral care;Standing;Min guard                                       Vision Baseline Vision/History: No visual deficits Patient Visual Report: No change from baseline              Pertinent Vitals/Pain Pain Assessment: No/denies pain     Hand Dominance Right   Extremity/Trunk Assessment Upper Extremity Assessment Upper Extremity Assessment: Generalized weakness   Lower Extremity Assessment Lower Extremity Assessment: Generalized weakness   Cervical / Trunk Assessment Cervical / Trunk Assessment: Kyphotic   Communication Communication Communication: HOH   Cognition Arousal/Alertness: Awake/alert Behavior During Therapy: WFL for tasks assessed/performed Overall Cognitive Status: Within Functional Limits for tasks assessed  General Comments: very pleasant and cooperative   General Comments  Pt on 2L/min via nasal cannula, SpO2 90% or > during session, briefly reviewed pursed lip breathing            Home Living Family/patient expects to be discharged to:: Private residence Living Arrangements: Alone Available Help at Discharge: Family;Available PRN/intermittently Type of Home: House Home  Access: Stairs to enter [employer] of Steps: 2 Entrance Stairs-Rails: Right;Left;Can reach both Home Layout: One level     Bathroom Shower/Tub: Occupational psychologist: Standard     Home Equipment: Environmental consultant - 2 wheels;Cane - single point;Bedside commode;Shower seat;Grab bars - tub/shower          Prior Functioning/Environment Level of Independence: Independent with assistive device(s)        Comments: Mod I with RW, doesn't drive (children assist with that), bathes, dresses, no falls in past 6 months. Pt reports he performs his own IADLs but does not do much cooking.        OT Problem List: Decreased strength;Decreased activity tolerance;Decreased safety awareness;Impaired balance (sitting and/or standing);Decreased knowledge of use of DME or AE      OT Treatment/Interventions: Self-care/ADL training;Balance training;Therapeutic exercise;Therapeutic activities;Energy conservation;DME and/or AE instruction;Patient/family education;Manual therapy    OT Goals(Current goals can be found in the care plan section) Acute Rehab OT Goals Patient Stated Goal: get better, go to rehab OT Goal Formulation: With patient Time For Goal Achievement: 12/12/20 Potential to Achieve Goals: Fair ADL Goals Pt Will Perform Grooming: with modified independence;standing Pt Will Perform Lower Body Dressing: with modified independence;sit to/from stand Pt Will Transfer to Toilet: with modified independence;ambulating Pt Will Perform Toileting - Clothing Manipulation and hygiene: with modified independence;sit to/from stand Pt/caregiver will Perform Home Exercise Program: Increased strength;Both right and left upper extremity;With theraband;With written HEP provided;Independently  OT Frequency: Min 2X/week   Barriers to D/C:    Pt lives alone          AM-PAC OT "6 Clicks" Daily Activity     Outcome Measure Help from another person eating meals?: None Help from another  person taking care of personal grooming?: None Help from another person toileting, which includes using toliet, bedpan, or urinal?: A Little Help from another person bathing (including washing, rinsing, drying)?: A Little Help from another person to put on and taking off regular upper body clothing?: None Help from another person to put on and taking off regular lower body clothing?: A Little 6 Click Score: 21   End of Session Equipment Utilized During Treatment: Rolling walker Nurse Communication: Mobility status  Activity Tolerance: Patient tolerated treatment well Patient left: in bed;with call bell/phone within reach;with bed alarm set  OT Visit Diagnosis: Unsteadiness on feet (R26.81);Muscle weakness (generalized) (M62.81);History of falling (Z91.81)                Time: 1400-1420 OT Time Calculation (min): 20 min Charges:  OT General Charges $OT Visit: 1 Visit OT Evaluation $OT Eval Low Complexity: 1 Low OT Treatments $Self Care/Home Management : 8-22 mins  Darleen Crocker, MS, OTR/L , CBIS ascom [phone]  11/28/20, 3:49 PM

## 2020-11-29 ENCOUNTER — Inpatient Hospital Stay: Payer: PPO

## 2020-11-29 DIAGNOSIS — J9601 Acute respiratory failure with hypoxia: Secondary | ICD-10-CM

## 2020-11-29 DIAGNOSIS — I5043 Acute on chronic combined systolic (congestive) and diastolic (congestive) heart failure: Secondary | ICD-10-CM | POA: Diagnosis not present

## 2020-11-29 DIAGNOSIS — N1832 Chronic kidney disease, stage 3b: Secondary | ICD-10-CM | POA: Diagnosis not present

## 2020-11-29 DIAGNOSIS — N179 Acute kidney failure, unspecified: Secondary | ICD-10-CM | POA: Diagnosis not present

## 2020-11-29 LAB — BASIC METABOLIC PANEL
Anion gap: 11 (ref 5–15)
BUN: 45 mg/dL — ABNORMAL HIGH (ref 8–23)
CO2: 24 mmol/L (ref 22–32)
Calcium: 8.5 mg/dL — ABNORMAL LOW (ref 8.9–10.3)
Chloride: 98 mmol/L (ref 98–111)
Creatinine, Ser: 1.96 mg/dL — ABNORMAL HIGH (ref 0.61–1.24)
GFR, Estimated: 32 mL/min — ABNORMAL LOW (ref >60)
Glucose, Bld: 87 mg/dL (ref 70–99)
Potassium: 3.9 mmol/L (ref 3.5–5.1)
Sodium: 133 mmol/L — ABNORMAL LOW (ref 135–145)

## 2020-11-29 LAB — MAGNESIUM: Magnesium: 1.8 mg/dL (ref 1.7–2.4)

## 2020-11-29 MED ORDER — FUROSEMIDE 10 MG/ML IJ SOLN
20.0000 mg | Freq: Once | INTRAMUSCULAR | Status: AC
  Administered 2020-11-29: 20 mg via INTRAVENOUS
  Filled 2020-11-29: qty 2

## 2020-11-29 MED ORDER — IPRATROPIUM-ALBUTEROL 0.5-2.5 (3) MG/3ML IN SOLN
3.0000 mL | RESPIRATORY_TRACT | Status: DC | PRN
  Administered 2020-11-29 – 2020-12-04 (×8): 3 mL via RESPIRATORY_TRACT
  Filled 2020-11-29 (×8): qty 3

## 2020-11-29 MED ORDER — ALBUMIN HUMAN 25 % IV SOLN
25.0000 g | Freq: Once | INTRAVENOUS | Status: AC
  Administered 2020-11-29: 25 g via INTRAVENOUS
  Filled 2020-11-29: qty 100

## 2020-11-29 MED ORDER — FUROSEMIDE 10 MG/ML IJ SOLN
6.0000 mg/h | INTRAVENOUS | Status: DC
  Administered 2020-11-29 – 2020-12-01 (×2): 4 mg/h via INTRAVENOUS
  Administered 2020-12-02: 6 mg/h via INTRAVENOUS
  Filled 2020-11-29 (×3): qty 20

## 2020-11-29 NOTE — TOC Progression Note (Signed)
Transition of Care [facility]) - Progression Note    Patient Details  Name: MELITON SAMAD MRN: [id number] Date of Birth: [birth year]  Transition of Care [facility]) CM/SW Contact  Eileen Stanford, LCSW Phone Number: 11/29/2020, 3:32 PM  Clinical Narrative:   Heart Failure screening completed by Pricilla Riffle, Heart Failure RN. No needs noted.    Expected Discharge Plan: Skilled Nursing Facility Barriers to Discharge: Continued Medical Work up  Expected Discharge Plan and Services Expected Discharge Plan: [place] Choice: [place] arrangements for the past 2 months: Single Family Home                                       Social Determinants of Health (SDOH) Interventions    Readmission Risk Interventions Readmission Risk Prevention Plan 07/30/2019 06/08/2019  Transportation Screening Complete Complete  PCP or Specialist Appt within 3-5 Days Complete Complete  HRI or Home Care Consult Complete Complete  Social Work Consult for [place] Planning/Counseling - Complete  Palliative Care Screening - Not Applicable  Medication Review Press photographer) Complete Complete  Some recent data might be hidden

## 2020-11-29 NOTE — Progress Notes (Signed)
PROGRESS NOTE    CHIEF WALKUP  UJW:[id number] DOB: Jul 24, 1930 DOA: 11/26/2020 PCP: Rusty Aus, MD   Chief complaint.  Shortness of breath. Brief Narrative:   Andres Gutierrez is a 87 y.o. male with medical history significant of CHF, CKD 3B, A. fib, BPH, CAD status post CABG, cerebral amyloid angiopathy, GERD, hyperlipidemia, hypertension, iron deficiency anemia, myelodysplastic syndrome who presents with ongoing increased dyspnea on exertion, edema, weight gain. Echocardiogram performed in May 2022 showed ejection fraction 25 to 30%.  He is started on IV Lasix 7/19.  Patient had a worsening short of breath already this morning, was placed on oxygen for hypoxemia.  Assessment & Plan:   Principal Problem:   Acute exacerbation of CHF (congestive heart failure) (HCC) Active Problems:   CAD (coronary artery disease)   HTN (hypertension)   Chronic atrial fibrillation (HCC)   Dyspnea on exertion   BPH (benign prostatic hyperplasia)   Gastroesophageal reflux disease with esophagitis   Thrombocytopenia (HCC)   HLD (hyperlipidemia)   MDS (myelodysplastic syndrome), low grade (HCC)   Hyperkalemia   Acute renal failure superimposed on stage 3b chronic kidney disease (HCC)   Acute on chronic combined systolic (congestive) and diastolic (congestive) heart failure ([place])  #1. Acute on chronic combined systolic and diastolic congestive heart failure. Elevated troponin secondary to congestive heart failure. Acute hypoxemic respiratory failure. Patient had a worsening short of breath earlier this morning, consistent with congestive heart failure exacerbation.  He also requiring oxygen. He had a slightly worsening renal function 2 days ago, Lasix was reduced to oral torsemide yesterday. At this point, I will restart IV Lasix with a drip.  We will follow renal function closely.  2.  Acute kidney injury on chronic kidney disease stage IIIb. Hyperkalemia Renal function was better yesterday since  change to oral torsemide.  Restarting IV Lasix today, will follow renal function closely. Potassium have normalized.  3.  Coronary disease with status post CABG. Continue current treatment.  4.  Anemia. Thrombocytopenia. Myelodysplastic syndrome. Patient has no deficiency iron and B12.  We will continue to follow.   DVT prophylaxis: SCDs Code Status: full Family Communication: daughter updated.  Disposition Plan:    Status is: Inpatient  Remains inpatient appropriate because:IV treatments appropriate due to intensity of illness or inability to take PO and Inpatient level of care appropriate due to severity of illness  Dispo: The patient is from: Home              Anticipated d/c is to: SNF              Patient currently is not medically stable to d/c.   Difficult to place patient No        I/O last 3 completed shifts: In: 1382.8 [P.O.:1340; IV Piggyback:42.8] Out: 1400 [Urine:1400] No intake/output data recorded.     Consultants:  card  Procedures: none  Antimicrobials: none   Subjective: Patient had episode short of breath overnight, he developed hypoxemia, with a placed on oxygen.  He had a sense of doom last night. Still short of breath with exertion today, cough, nonproductive. No abdominal pain or nausea vomiting No fever chills  No chest pain palpitation. Dysuria or hematuria.  Objective: Vitals:   11/28/20 2137 11/28/20 2345 11/29/20 0357 11/29/20 0735  BP: 104/65 119/74 (!) 118/56 (!) 120/51  Pulse: 68 71 75 67  Resp:  (!) 22 15   Temp:  97.7 F (36.5 C) 98.7 F (37.1 C) 97.6 F (36.4  C)  TempSrc:  Oral  Oral  SpO2: 95%  95% 97%  Weight:   81.6 kg   Height:        Intake/Output Summary (Last 24 hours) at 11/29/2020 0816 Last data filed at 11/29/2020 0655 Gross per 24 hour  Intake 1082.75 ml  Output 850 ml  Net 232.75 ml   Filed Weights   11/27/20 0215 11/28/20 0633 11/29/20 0357  Weight: 83.9 kg 80.4 kg 81.6 kg     Examination:  General exam: Ill-appearing, but comfortable this morning. Respiratory system: Crackles in the base.  Respiratory effort normal. Cardiovascular system: S1 & S2 heard, RRR. No JVD, murmurs, rubs, gallops or clicks. No pedal edema. Gastrointestinal system: Abdomen is nondistended, soft and nontender. No organomegaly or masses felt. Normal bowel sounds heard. Central nervous system: Alert and oriented x3. No focal neurological deficits. Extremities: Symmetric 5 x 5 power. Skin: No rashes, lesions or ulcers Psychiatry:  Mood & affect appropriate.     Data Reviewed: I have personally reviewed following labs and imaging studies  CBC: Recent Labs  Lab 11/26/20 1645 11/27/20 0436 11/28/20 0412  WBC 6.6 5.8 5.5  NEUTROABS  --   --  1.8  HGB 9.0* 8.7* 8.6*  HCT 27.9* 25.9* 26.0*  MCV 101.8* 103.6* 100.8*  PLT 110* 71* 88*   Basic Metabolic Panel: Recent Labs  Lab 11/26/20 1536 11/27/20 0436 11/28/20 0412 11/29/20 0556  NA 136 135 135 133*  K 5.5* 4.7 4.1 3.9  CL 107 104 104 98  CO2 21* 21* 24 24  GLUCOSE 101* 85 83 87  BUN 45* 44* 46* 45*  CREATININE 2.02* 1.87* 2.08* 1.96*  CALCIUM 9.3 8.8* 8.9 8.5*  MG 1.8  --  1.5* 1.8   GFR: Estimated Creatinine Clearance: 30.6 mL/min (A) (by C-G formula based on SCr of 1.96 mg/dL (H)). Liver Function Tests: Recent Labs  Lab 11/26/20 1536  AST 35  ALT 9  ALKPHOS 54  BILITOT 1.6*  PROT 7.6  ALBUMIN 4.3   No results for input(s): LIPASE, AMYLASE in the last 168 hours. No results for input(s): AMMONIA in the last 168 hours. Coagulation Profile: No results for input(s): INR, PROTIME in the last 168 hours. Cardiac Enzymes: No results for input(s): CKTOTAL, CKMB, CKMBINDEX, TROPONINI in the last 168 hours. BNP (last 3 results) No results for input(s): PROBNP in the last 8760 hours. HbA1C: No results for input(s): HGBA1C in the last 72 hours. CBG: Recent Labs  Lab 11/22/20 0825  GLUCAP 90   Lipid  Profile: No results for input(s): CHOL, HDL, LDLCALC, TRIG, CHOLHDL, LDLDIRECT in the last 72 hours. Thyroid Function Tests: No results for input(s): TSH, T4TOTAL, FREET4, T3FREE, THYROIDAB in the last 72 hours. Anemia Panel: Recent Labs    11/26/20 2055 11/27/20 0436  VITAMINB12 2,117*  --   TIBC  --  244*  IRON  --  43*   Sepsis Labs: No results for input(s): PROCALCITON, LATICACIDVEN in the last 168 hours.  Recent Results (from the past 240 hour(s))  Resp Panel by RT-PCR (Flu A&B, Covid) Nasopharyngeal Swab     Status: None   Collection Time: 11/26/20  4:13 PM   Specimen: Nasopharyngeal Swab; Nasopharyngeal(NP) swabs in vial transport medium  Result Value Ref Range Status   SARS Coronavirus 2 by RT PCR NEGATIVE NEGATIVE Final    Comment: (NOTE) SARS-CoV-2 target nucleic acids are NOT DETECTED.  The SARS-CoV-2 RNA is generally detectable in upper respiratory specimens during the acute phase of  infection. The lowest concentration of SARS-CoV-2 viral copies this assay can detect is 138 copies/mL. A negative result does not preclude SARS-Cov-2 infection and should not be used as the sole basis for treatment or other patient management decisions. A negative result may occur with  improper specimen collection/handling, submission of specimen other than nasopharyngeal swab, presence of viral mutation(s) within the areas targeted by this assay, and inadequate number of viral copies(<138 copies/mL). A negative result must be combined with clinical observations, patient history, and epidemiological information. The expected result is Negative.  Fact Sheet for Patients:  EntrepreneurPulse.com.au  Fact Sheet for Healthcare Providers:  IncredibleEmployment.be  This test is no t yet approved or cleared by the [place] FDA and  has been authorized for detection and/or diagnosis of SARS-CoV-2 by FDA under an Emergency Use Authorization (EUA).  This EUA will remain  in effect (meaning this test can be used) for the duration of the COVID-19 declaration under Section 564(b)(1) of the Act, 21 U.S.C.section 360bbb-3(b)(1), unless the authorization is terminated  or revoked sooner.       Influenza A by PCR NEGATIVE NEGATIVE Final   Influenza B by PCR NEGATIVE NEGATIVE Final    Comment: (NOTE) The Xpert Xpress SARS-CoV-2/FLU/RSV plus assay is intended as an aid in the diagnosis of influenza from Nasopharyngeal swab specimens and should not be used as a sole basis for treatment. Nasal washings and aspirates are unacceptable for Xpert Xpress SARS-CoV-2/FLU/RSV testing.  Fact Sheet for Patients: EntrepreneurPulse.com.au  Fact Sheet for Healthcare Providers: IncredibleEmployment.be  This test is not yet approved or cleared by the [place] FDA and has been authorized for detection and/or diagnosis of SARS-CoV-2 by FDA under an Emergency Use Authorization (EUA). This EUA will remain in effect (meaning this test can be used) for the duration of the COVID-19 declaration under Section 564(b)(1) of the Act, 21 U.S.C. section 360bbb-3(b)(1), unless the authorization is terminated or revoked.  Performed at [facility], [address], [place], [place] [address]          Radiology Studies: No results found.      Scheduled Meds:  atorvastatin  40 mg Oral QHS   cephALEXin  500 mg Oral BID   digoxin  0.0625 mg Oral Daily   ferrous sulfate  325 mg Oral Q M,W,F   hydrALAZINE  25 mg Oral BID   isosorbide mononitrate  30 mg Oral Daily   metoprolol tartrate  12.5 mg Oral BID   ranolazine  500 mg Oral BID   sodium chloride flush  3 mL Intravenous Q12H   sucralfate  1 g Oral BID   tamsulosin  0.4 mg Oral QPC supper   torsemide  40 mg Oral BID   Continuous Infusions:  furosemide (LASIX) 200 mg in dextrose 5% 100 mL (2mg /mL) infusion       LOS: 2 days    Time spent: 34  minutes, more than 50% time spent on direct patient care.    Sharen Hones, MD [facility]   To contact the attending provider between 7A-7P or the covering provider during after hours 7P-7A, please log into the web site [email or web address] and access using universal [place] password for that web site. If you do not have the password, please call the hospital operator.  11/29/2020, 8:16 AM

## 2020-11-29 NOTE — Progress Notes (Signed)
Patient SOB this am with feelings of impending doom. Dr Roosevelt Locks notified and came to assess patient. Lasix drip was ordered and started. Lasix drip has been infusing all day. Patient had only voided 375 ml of urine all day and was complaining of abdominal distention. Bladder scan done which showed 165 ml in bladder. Dr Roosevelt Locks ordered a In and Out Cath which was done and 225 ml of urine was obtained. Albumin is now ordered to give.

## 2020-11-30 DIAGNOSIS — N179 Acute kidney failure, unspecified: Secondary | ICD-10-CM | POA: Diagnosis not present

## 2020-11-30 DIAGNOSIS — I5043 Acute on chronic combined systolic (congestive) and diastolic (congestive) heart failure: Secondary | ICD-10-CM | POA: Diagnosis not present

## 2020-11-30 DIAGNOSIS — N1832 Chronic kidney disease, stage 3b: Secondary | ICD-10-CM | POA: Diagnosis not present

## 2020-11-30 DIAGNOSIS — J9601 Acute respiratory failure with hypoxia: Secondary | ICD-10-CM | POA: Diagnosis not present

## 2020-11-30 LAB — BASIC METABOLIC PANEL
Anion gap: 13 (ref 5–15)
BUN: 48 mg/dL — ABNORMAL HIGH (ref 8–23)
CO2: 23 mmol/L (ref 22–32)
Calcium: 9.2 mg/dL (ref 8.9–10.3)
Chloride: 96 mmol/L — ABNORMAL LOW (ref 98–111)
Creatinine, Ser: 2.01 mg/dL — ABNORMAL HIGH (ref 0.61–1.24)
GFR, Estimated: 32 mL/min — ABNORMAL LOW (ref >60)
Glucose, Bld: 93 mg/dL (ref 70–99)
Potassium: 4.1 mmol/L (ref 3.5–5.1)
Sodium: 132 mmol/L — ABNORMAL LOW (ref 135–145)

## 2020-11-30 LAB — CBC WITH DIFFERENTIAL/PLATELET
Abs Immature Granulocytes: 0.06 10*3/uL (ref 0.00–0.07)
Basophils Absolute: 0 10*3/uL (ref 0.0–0.1)
Basophils Relative: 0 %
Eosinophils Absolute: 0 10*3/uL (ref 0.0–0.5)
Eosinophils Relative: 0 %
HCT: 26.2 % — ABNORMAL LOW (ref 39.0–52.0)
Hemoglobin: 8.7 g/dL — ABNORMAL LOW (ref 13.0–17.0)
Immature Granulocytes: 1 %
Lymphocytes Relative: 17 %
Lymphs Abs: 1.4 10*3/uL (ref 0.7–4.0)
MCH: 32.5 pg (ref 26.0–34.0)
MCHC: 33.2 g/dL (ref 30.0–36.0)
MCV: 97.8 fL (ref 80.0–100.0)
Monocytes Absolute: 3 10*3/uL — ABNORMAL HIGH (ref 0.1–1.0)
Monocytes Relative: 37 %
Neutro Abs: 3.7 10*3/uL (ref 1.7–7.7)
Neutrophils Relative %: 45 %
Platelets: 82 10*3/uL — ABNORMAL LOW (ref 150–400)
RBC: 2.68 MIL/uL — ABNORMAL LOW (ref 4.22–5.81)
RDW: 19.3 % — ABNORMAL HIGH (ref 11.5–15.5)
Smear Review: NORMAL
WBC: 8.3 10*3/uL (ref 4.0–10.5)
nRBC: 0 % (ref 0.0–0.2)

## 2020-11-30 LAB — BRAIN NATRIURETIC PEPTIDE: B Natriuretic Peptide: 1474.7 pg/mL — ABNORMAL HIGH (ref 0.0–100.0)

## 2020-11-30 LAB — MAGNESIUM: Magnesium: 1.8 mg/dL (ref 1.7–2.4)

## 2020-11-30 NOTE — Progress Notes (Signed)
Heart Failure Nurse Navigator Note  HFrEF 25-30%   Met with patient today.  His son Baldemar is present.  Pt denies any SOB.   Labs:  Sodium 132, potassium 4.1, chloride 96, CO2 23, BUN 48, creatinine 2.01 up from 1.96 of yesterday. Weight is 80.9 kg ,yesterday's weight was 81.6 kg. Intake 275 mL Output 600 mL  He has an appointment in the heart failure clinic with Darylene Price on August 3 at 12:00.   Pricilla Riffle RN CHFN

## 2020-11-30 NOTE — Progress Notes (Signed)
[facility]) Hospital Liaison note:   This patient is currently enrolled in [facility] outpatient-based Palliative Care. Will continue to follow for disposition.  Please call with any outpatient palliative questions or concerns.  Thank you, Lorelee Market, LPN [facility] Liaison [phone]

## 2020-11-30 NOTE — TOC Progression Note (Signed)
Transition of Care [facility]) - Progression Note    Patient Details  Name: Andres Gutierrez MRN: [id number] Date of Birth: 1930-05-22  Transition of Care [facility]) CM/SW Contact  Eileen Stanford, LCSW Phone Number: 11/30/2020, 10:33 AM  Clinical Narrative: [employer] states case was sent for medical review and was denied. Peer to peer is offered. Information provided to MD.      Expected Discharge Plan: [place] Barriers to Discharge: Continued Medical Work up  Expected Discharge Plan and Services Expected Discharge Plan: [place] Choice: [place] arrangements for the past 2 months: Single Family Home                                       Social Determinants of Health (SDOH) Interventions    Readmission Risk Interventions Readmission Risk Prevention Plan 07/30/2019 06/08/2019  Transportation Screening Complete Complete  PCP or Specialist Appt within 3-5 Days Complete Complete  HRI or Home Care Consult Complete Complete  Social Work Consult for [place] Planning/Counseling - Complete  Palliative Care Screening - Not Applicable  Medication Review Press photographer) Complete Complete  Some recent data might be hidden

## 2020-11-30 NOTE — Progress Notes (Signed)
Occupational Therapy Treatment Patient Details Name: Andres Gutierrez MRN: [id number] DOB: 11/26/1930 Today's Date: 11/30/2020    History of present illness Andres Gutierrez is a 87 y.o. male with medical history significant of CHF, CKD 3B, A. fib, BPH, CAD status post CABG, cerebral amyloid angiopathy, GERD, hyperlipidemia, hypertension, iron deficiency anemia, myelodysplastic syndrome who presents with ongoing increased dyspnea on exertion, edema, weight gain. Pt is being treated for acute on chronic combined systolic & diastolic congestive heart failure.   OT comments  Upon entering the room, pt supine in bed and sleeping soundly but awakens easily to therapist's voice. Pt agreeable to OT intervention. Supine >sit to EOB with min guard for trunk support. Focus on B UE strengthening HEP with use of red resistive theraband and cuing for pursed lip breathing. Pt on 2 L O2 via [place] throughout session. Pt does appear to be wheezing with RN notified but all vitals remains WNLs. Pt does report increased exertion with therapeutic exercise. Pt performed 3 sets of 10 chest pulls, shoulder diagonals, shoulder elevation, and bicep curls. Pt returning to supine at end of session with call bell and all needed items within reach.    Follow Up Recommendations  SNF    Equipment Recommendations  Other (comment) (defer to next venue of care)       Precautions / Restrictions Precautions Precautions: Fall Precaution Comments: pt with LLE surgical shoe (per daughter podiatrist wants him to wear that shoe, he visits podiatrists every 2-4 weeks for tx of calluses) Restrictions Weight Bearing Restrictions: No       Mobility Bed Mobility Overal bed mobility: Needs Assistance Bed Mobility: Sit to Supine;Supine to Sit     Supine to sit: Min guard Sit to supine: Min guard   General bed mobility comments: min cuing for hand placement    Transfers                      Balance Overall balance assessment:  Needs assistance Sitting-balance support: Feet supported;Bilateral upper extremity supported Sitting balance-[name]: Good                                     ADL either performed or assessed with clinical judgement     Vision Patient Visual Report: No change from baseline                Cognition Arousal/Alertness: Awake/alert Behavior During Therapy: WFL for tasks assessed/performed Overall Cognitive Status: Within Functional Limits for tasks assessed                                 General Comments: very pleasant and cooperative                          Pertinent Vitals/ Pain       Pain Assessment: No/denies pain   Frequency  Min 2X/week        Progress Toward Goals  OT Goals(current goals can now be found in the care plan section)  Progress towards OT goals: Progressing toward goals  Acute Rehab OT Goals Patient Stated Goal: get better, go to rehab OT Goal Formulation: With patient Time For Goal Achievement: 12/12/20 Potential to Achieve Goals: [place] Discharge plan remains appropriate       AM-PAC OT "  6 Clicks" Daily Activity     Outcome Measure   Help from another person eating meals?: None Help from another person taking care of personal grooming?: None Help from another person toileting, which includes using toliet, bedpan, or urinal?: A Little Help from another person bathing (including washing, rinsing, drying)?: A Little Help from another person to put on and taking off regular upper body clothing?: None Help from another person to put on and taking off regular lower body clothing?: A Little 6 Click Score: 21    End of Session Equipment Utilized During Treatment: Rolling walker  OT Visit Diagnosis: Unsteadiness on feet (R26.81);Muscle weakness (generalized) (M62.81);History of falling (Z91.81)   Activity Tolerance Patient tolerated treatment well   Patient Left in bed;with call bell/phone  within reach;with bed alarm set   Nurse Communication Mobility status        Time: 4982-6415 OT Time Calculation (min): 27 min  Charges: OT General Charges $OT Visit: 1 Visit OT Treatments $Therapeutic Exercise: 23-37 mins  Darleen Crocker, MS, OTR/L , CBIS ascom [phone]  11/30/20, 4:06 PM

## 2020-11-30 NOTE — Care Management Important Message (Signed)
Important Message  Patient Details  Name: Andres Gutierrez MRN: [id number] Date of Birth: [birth year]   Medicare Important Message Given:  Yes     [name] 11/30/2020, 11:15 AM

## 2020-11-30 NOTE — TOC Progression Note (Signed)
Transition of Care [facility]) - Progression Note    Patient Details  Name: Andres Gutierrez MRN: [id number] Date of Birth: Sep 16, 1930  Transition of Care [facility]) CM/SW [place], LCSW Phone Number: 11/30/2020, 1:51 PM  Clinical Narrative:  Updated patient's daughter about peer-to-peer. She said patient has been having worsening symptoms since Monday including fluid overload and shortness of breath. Patient lives home alone and she does not think he is able to manage right now. Daughter and son both live relatively nearby. Asked if he would be able to come home with either of them in the event insurance authorization was denied for SNF. She said she works from home but has a Engineer, materials that would not be good for patient walking around with a walker. She will discuss with her brother. Will update patient's daughter once insurance authorization determination made.   Expected Discharge Plan: Skilled Nursing Facility Barriers to Discharge: Continued Medical Work up  Expected Discharge Plan and Services Expected Discharge Plan: [place] Choice: [place] arrangements for the past 2 months: Single Family Home                                       Social Determinants of Health (SDOH) Interventions    Readmission Risk Interventions Readmission Risk Prevention Plan 07/30/2019 06/08/2019  Transportation Screening Complete Complete  PCP or Specialist Appt within 3-5 Days Complete Complete  HRI or Home Care Consult Complete Complete  Social Work Consult for [place] Planning/Counseling - Complete  Palliative Care Screening - Not Applicable  Medication Review Press photographer) Complete Complete  Some recent data might be hidden

## 2020-11-30 NOTE — Progress Notes (Signed)
PROGRESS NOTE    Andres Gutierrez  NTI:[id number] DOB: [birth year] DOA: 11/26/2020 PCP: Rusty Aus, MD   Chief complaint.  Shortness of breath. Brief Narrative:   Andres Gutierrez is a 87 y.o. male with medical history significant of CHF, CKD 3B, A. fib, BPH, CAD status post CABG, cerebral amyloid angiopathy, GERD, hyperlipidemia, hypertension, iron deficiency anemia, myelodysplastic syndrome who presents with ongoing increased dyspnea on exertion, edema, weight gain. Echocardiogram performed in May 2022 showed ejection fraction 25 to 30%.  He is started on IV Lasix 7/19.  Patient had a worsening short of breath already this morning, was placed on oxygen for hypoxemia. 7/10 BNP ELEVATED. Pt reports no sob while resting in bed. On lasix gtt.   Assessment & Plan:   Principal Problem:   Acute exacerbation of CHF (congestive heart failure) (HCC) Active Problems:   CAD (coronary artery disease)   HTN (hypertension)   Chronic atrial fibrillation (HCC)   Dyspnea on exertion   BPH (benign prostatic hyperplasia)   Gastroesophageal reflux disease with esophagitis   Thrombocytopenia (HCC)   HLD (hyperlipidemia)   MDS (myelodysplastic syndrome), low grade (HCC)   Hyperkalemia   Acute renal failure superimposed on stage 3b chronic kidney disease (HCC)   Acute on chronic combined systolic (congestive) and diastolic (congestive) heart failure ([place])   Acute hypoxemic respiratory failure ([place])  #1. Acute on chronic combined systolic and diastolic congestive heart failure. Elevated troponin secondary to congestive heart failure. Acute hypoxemic respiratory failure. Patient had a worsening short of breath earlier this morning, consistent with congestive heart failure exacerbation.  He also requiring oxygen. He had a slightly worsening renal function 2 days ago, Lasix was reduced to oral torsemide  7/20- was restarted on lasix gtt. Bnp elevated. Will d/w cards about ? Initiation of ionotropes.  Monitor  renal function Palliative care consult for discussion of goals of care   2.  Acute kidney injury on chronic kidney disease stage IIIb. Hyperkalemia: Stable, will monitor closely while on lasix gtt   3.  Coronary disease with status post CABG. Continue cardiac medications  4.  Anemia. Thrombocytopenia. Myelodysplastic syndrome. H&H stable.  Platelets stable.  Monitor periodically   DVT prophylaxis: SCDs Code Status: full Family Communication: None at bedside Disposition Plan:    Status is: Inpatient  Remains inpatient appropriate because:IV treatments appropriate due to intensity of illness or inability to take PO and Inpatient level of care appropriate due to severity of illness  Dispo: The patient is from: Home              Anticipated d/c is to: SNF              Patient currently is not medically stable to d/c.   Difficult to place patient No        I/O last 3 completed shifts: In: 454.9 [P.O.:420; I.V.:34.9] Out: 1450 [Urine:1450] Total I/O In: 360 [P.O.:360] Out: 450 [Urine:450]     Consultants:  card  Procedures: none  Antimicrobials: none   Subjective: No shortness of breath while lying in bed.  Has not really ambulated much.  No chest pain   Objective: Vitals:   11/30/20 0730 11/30/20 0915 11/30/20 1229 11/30/20 1528  BP: 124/67 (!) 112/59 (!) 108/57 (!) 94/59  Pulse: 71 70 70 67  Resp: 18  18 19   Temp: 97.6 F (36.4 C)  97.6 F (36.4 C) 97.6 F (36.4 C)  TempSrc: Oral  Oral Oral  SpO2: 95%  95% 95%  Weight:      Height:        Intake/Output Summary (Last 24 hours) at 11/30/2020 1530 Last data filed at 11/30/2020 1005 Gross per 24 hour  Intake 394.93 ml  Output 675 ml  Net -280.07 ml   Filed Weights   11/28/20 0633 11/29/20 0357 11/30/20 0354  Weight: 80.4 kg 81.6 kg 80.9 kg    Examination: Calm, NAD Decreased breath sounds no wheezing Regular S1-S2 no gallops Soft benign positive bowel sounds Trace edema  bilaterally Awake and alert, oriented grossly intact Mood and affect appropriate in current setting    Data Reviewed: I have personally reviewed following labs and imaging studies  CBC: Recent Labs  Lab 11/26/20 1645 11/27/20 0436 11/28/20 0412 11/30/20 0538  WBC 6.6 5.8 5.5 8.3  NEUTROABS  --   --  1.8 3.7  HGB 9.0* 8.7* 8.6* 8.7*  HCT 27.9* 25.9* 26.0* 26.2*  MCV 101.8* 103.6* 100.8* 97.8  PLT 110* 71* 88* 82*   Basic Metabolic Panel: Recent Labs  Lab 11/26/20 1536 11/27/20 0436 11/28/20 0412 11/29/20 0556 11/30/20 0538  NA 136 135 135 133* 132*  K 5.5* 4.7 4.1 3.9 4.1  CL 107 104 104 98 96*  CO2 21* 21* 24 24 23   GLUCOSE 101* 85 83 87 93  BUN 45* 44* 46* 45* 48*  CREATININE 2.02* 1.87* 2.08* 1.96* 2.01*  CALCIUM 9.3 8.8* 8.9 8.5* 9.2  MG 1.8  --  1.5* 1.8 1.8   GFR: Estimated Creatinine Clearance: 29.6 mL/min (A) (by C-G formula based on SCr of 2.01 mg/dL (H)). Liver Function Tests: Recent Labs  Lab 11/26/20 1536  AST 35  ALT 9  ALKPHOS 54  BILITOT 1.6*  PROT 7.6  ALBUMIN 4.3   No results for input(s): LIPASE, AMYLASE in the last 168 hours. No results for input(s): AMMONIA in the last 168 hours. Coagulation Profile: No results for input(s): INR, PROTIME in the last 168 hours. Cardiac Enzymes: No results for input(s): CKTOTAL, CKMB, CKMBINDEX, TROPONINI in the last 168 hours. BNP (last 3 results) No results for input(s): PROBNP in the last 8760 hours. HbA1C: No results for input(s): HGBA1C in the last 72 hours. CBG: No results for input(s): GLUCAP in the last 168 hours.  Lipid Profile: No results for input(s): CHOL, HDL, LDLCALC, TRIG, CHOLHDL, LDLDIRECT in the last 72 hours. Thyroid Function Tests: No results for input(s): TSH, T4TOTAL, FREET4, T3FREE, THYROIDAB in the last 72 hours. Anemia Panel: No results for input(s): VITAMINB12, FOLATE, FERRITIN, TIBC, IRON, RETICCTPCT in the last 72 hours.  Sepsis Labs: No results for input(s):  PROCALCITON, LATICACIDVEN in the last 168 hours.  Recent Results (from the past 240 hour(s))  Resp Panel by RT-PCR (Flu A&B, Covid) Nasopharyngeal Swab     Status: None   Collection Time: 11/26/20  4:13 PM   Specimen: Nasopharyngeal Swab; Nasopharyngeal(NP) swabs in vial transport medium  Result Value Ref Range Status   SARS Coronavirus 2 by RT PCR NEGATIVE NEGATIVE Final    Comment: (NOTE) SARS-CoV-2 target nucleic acids are NOT DETECTED.  The SARS-CoV-2 RNA is generally detectable in upper respiratory specimens during the acute phase of infection. The lowest concentration of SARS-CoV-2 viral copies this assay can detect is 138 copies/mL. A negative result does not preclude SARS-Cov-2 infection and should not be used as the sole basis for treatment or other patient management decisions. A negative result may occur with  improper specimen collection/handling, submission of specimen other than nasopharyngeal swab, presence of viral  mutation(s) within the areas targeted by this assay, and inadequate number of viral copies(<138 copies/mL). A negative result must be combined with clinical observations, patient history, and epidemiological information. The expected result is Negative.  Fact Sheet for Patients:  EntrepreneurPulse.com.au  Fact Sheet for Healthcare Providers:  IncredibleEmployment.be  This test is no t yet approved or cleared by the [place] FDA and  has been authorized for detection and/or diagnosis of SARS-CoV-2 by FDA under an Emergency Use Authorization (EUA). This EUA will remain  in effect (meaning this test can be used) for the duration of the COVID-19 declaration under Section 564(b)(1) of the Act, 21 U.S.C.section 360bbb-3(b)(1), unless the authorization is terminated  or revoked sooner.       Influenza A by PCR NEGATIVE NEGATIVE Final   Influenza B by PCR NEGATIVE NEGATIVE Final    Comment: (NOTE) The Xpert Xpress  SARS-CoV-2/FLU/RSV plus assay is intended as an aid in the diagnosis of influenza from Nasopharyngeal swab specimens and should not be used as a sole basis for treatment. Nasal washings and aspirates are unacceptable for Xpert Xpress SARS-CoV-2/FLU/RSV testing.  Fact Sheet for Patients: EntrepreneurPulse.com.au  Fact Sheet for Healthcare Providers: IncredibleEmployment.be  This test is not yet approved or cleared by the [place] FDA and has been authorized for detection and/or diagnosis of SARS-CoV-2 by FDA under an Emergency Use Authorization (EUA). This EUA will remain in effect (meaning this test can be used) for the duration of the COVID-19 declaration under Section 564(b)(1) of the Act, 21 U.S.C. section 360bbb-3(b)(1), unless the authorization is terminated or revoked.  Performed at [facility], [address] [place], [place] [address]          Radiology Studies: [facility] Chest [place] 1 View  Result Date: 11/29/2020 CLINICAL DATA:  Shortness of breath and CHF. EXAM: PORTABLE CHEST 1 VIEW COMPARISON:  11/26/2020 FINDINGS: 0816 hours. The cardio pericardial silhouette is enlarged. Vascular congestion with diffuse interstitial opacity is progressive in the interval. There is bibasilar atelectasis/infiltrate with small bilateral pleural effusions. Telemetry leads overlie the chest. IMPRESSION: Cardiomegaly with worsening pulmonary edema pattern. Basilar atelectasis/infiltrate with small bilateral pleural effusions. Electronically Signed   By: Misty Stanley M.D.   On: 11/29/2020 08:32        Scheduled Meds:  atorvastatin  40 mg Oral QHS   cephALEXin  500 mg Oral BID   digoxin  0.0625 mg Oral Daily   ferrous sulfate  325 mg Oral Q M,W,F   hydrALAZINE  25 mg Oral BID   isosorbide mononitrate  30 mg Oral Daily   metoprolol tartrate  12.5 mg Oral BID   ranolazine  500 mg Oral BID   sodium chloride flush  3 mL Intravenous Q12H    sucralfate  1 g Oral BID   tamsulosin  0.4 mg Oral QPC supper   Continuous Infusions:  furosemide (LASIX) 200 mg in dextrose 5% 100 mL (2mg /mL) infusion 4 mg/hr (11/29/20 1819)     LOS: 3 days    Time spent: 35 minutes, more than 50% time spent on direct patient care.    Nolberto Hanlon, MD [facility]   To contact the attending provider between 7A-7P or the covering provider during after hours 7P-7A, please log into the web site [email or web address] and access using universal  password for that web site. If you do not have the password, please call the hospital operator.  11/30/2020, 3:30 PM

## 2020-11-30 NOTE — Progress Notes (Signed)
[facility] Cardiology    SUBJECTIVE: Patient feels reasonably well now still on supplemental oxygen still has some wheezing denies any chest pain or leg edema.  Patient has significant dyspnea on exertion.  Patient currently has some audible wheezing   Vitals:   11/29/20 1956 11/29/20 2039 11/30/20 0354 11/30/20 0730  BP: (!) 110/57  119/68 124/67  Pulse: 72  74 71  Resp: 18  17 18   Temp: (!) 97.4 F (36.3 C)  98.7 F (37.1 C) 97.6 F (36.4 C)  TempSrc:    Oral  SpO2: 96% 96% 95% 95%  Weight:   80.9 kg   Height:         Intake/Output Summary (Last 24 hours) at 11/30/2020 0751 Last data filed at 11/30/2020 0300 Gross per 24 hour  Intake 274.93 ml  Output 600 ml  Net -325.07 ml      PHYSICAL EXAM  General: Well developed, well nourished, in no acute distress HEENT:  Normocephalic and atramatic Neck:  No JVD.  Lungs: Diminished with diffuse scattered wheezing bilaterally to auscultation and percussion. Heart: Irregularly irregular. Normal S1 and S2 without gallops or murmurs.  Abdomen: Bowel sounds are positive, abdomen soft and non-tender  Msk:  Back normal, normal gait. Normal strength and tone for age. Extremities: No clubbing, cyanosis or edema.   Neuro: Alert and oriented X 3. Psych:  Good affect, responds appropriately   LABS: Basic Metabolic Panel: Recent Labs    11/29/20 0556 11/30/20 0538  NA 133* 132*  K 3.9 4.1  CL 98 96*  CO2 24 23  GLUCOSE 87 93  BUN 45* 48*  CREATININE 1.96* 2.01*  CALCIUM 8.5* 9.2  MG 1.8 1.8   Liver Function Tests: No results for input(s): AST, ALT, ALKPHOS, BILITOT, PROT, ALBUMIN in the last 72 hours. No results for input(s): LIPASE, AMYLASE in the last 72 hours. CBC: Recent Labs    11/28/20 0412 11/30/20 0538  WBC 5.5 8.3  NEUTROABS 1.8 3.7  HGB 8.6* 8.7*  HCT 26.0* 26.2*  MCV 100.8* 97.8  PLT 88* 82*   Cardiac Enzymes: No results for input(s): CKTOTAL, CKMB, CKMBINDEX, TROPONINI in the last 72 hours. BNP: Invalid  input(s): POCBNP D-Dimer: No results for input(s): DDIMER in the last 72 hours. Hemoglobin A1C: No results for input(s): HGBA1C in the last 72 hours. Fasting Lipid Panel: No results for input(s): CHOL, HDL, LDLCALC, TRIG, CHOLHDL, LDLDIRECT in the last 72 hours. Thyroid Function Tests: No results for input(s): TSH, T4TOTAL, T3FREE, THYROIDAB in the last 72 hours.  Invalid input(s): FREET3 Anemia Panel: No results for input(s): VITAMINB12, FOLATE, FERRITIN, TIBC, IRON, RETICCTPCT in the last 72 hours.  DG Chest Port 1 View  Result Date: 11/29/2020 CLINICAL DATA:  Shortness of breath and CHF. EXAM: PORTABLE CHEST 1 VIEW COMPARISON:  11/26/2020 FINDINGS: 0816 hours. The cardio pericardial silhouette is enlarged. Vascular congestion with diffuse interstitial opacity is progressive in the interval. There is bibasilar atelectasis/infiltrate with small bilateral pleural effusions. Telemetry leads overlie the chest. IMPRESSION: Cardiomegaly with worsening pulmonary edema pattern. Basilar atelectasis/infiltrate with small bilateral pleural effusions. Electronically Signed   By: Misty Stanley M.D.   On: 11/29/2020 08:32     Echo severely depressed left ventricular function ejection fraction between 25 and 30%  TELEMETRY: Irregularly irregular rate of about 75:  ASSESSMENT AND PLAN:  Principal Problem:   Acute exacerbation of CHF (congestive heart failure) (HCC) Active Problems:   CAD (coronary artery disease)   HTN (hypertension)   Chronic atrial  fibrillation (HCC)   Dyspnea on exertion   BPH (benign prostatic hyperplasia)   Gastroesophageal reflux disease with esophagitis   Thrombocytopenia (HCC)   HLD (hyperlipidemia)   MDS (myelodysplastic syndrome), low grade (HCC)   Hyperkalemia   Acute renal failure superimposed on stage 3b chronic kidney disease (HCC)   Acute on chronic combined systolic (congestive) and diastolic (congestive) heart failure (HCC)   Acute hypoxemic respiratory  failure (HCC)   Plan Acute on chronic congestive heart failure with systolic dysfunction continue diuretics consider adding metolazone to Lasix drip Severe systolic dysfunction with significant acute on chronic renal insufficiency consider increasing hydralazine and Imdur for heart failure management I would eventually like to start ACE or ARB when it is okay with nephrology but renal insufficiency makes it problematic Ultimately the patient should be on Entresto if blood pressure tolerates and renal insufficiency tolerates it Recommend increasing inhalers and aggressive pulmonary therapy consider pulmonary input Consider advanced steroids inhalers supplemental oxygen all to help with wheezing and probable COPD I do not recommend cardiac pressures at this stage patient needs more aggressive heart failure therapy hopefully institute ACE ARB or Entresto once renal function tolerated. Acute on chronic renal insufficiency which is making diuresis difficult agree with nephrology input     Yolonda Kida, MD 11/30/2020 7:51 AM

## 2020-12-01 ENCOUNTER — Other Ambulatory Visit: Payer: Self-pay | Admitting: Primary Care

## 2020-12-01 DIAGNOSIS — J9601 Acute respiratory failure with hypoxia: Secondary | ICD-10-CM | POA: Diagnosis not present

## 2020-12-01 DIAGNOSIS — R0600 Dyspnea, unspecified: Secondary | ICD-10-CM | POA: Diagnosis not present

## 2020-12-01 DIAGNOSIS — I5043 Acute on chronic combined systolic (congestive) and diastolic (congestive) heart failure: Secondary | ICD-10-CM | POA: Diagnosis not present

## 2020-12-01 DIAGNOSIS — Z7189 Other specified counseling: Secondary | ICD-10-CM | POA: Diagnosis not present

## 2020-12-01 DIAGNOSIS — N179 Acute kidney failure, unspecified: Secondary | ICD-10-CM | POA: Diagnosis not present

## 2020-12-01 LAB — BASIC METABOLIC PANEL
Anion gap: 14 (ref 5–15)
BUN: 50 mg/dL — ABNORMAL HIGH (ref 8–23)
CO2: 24 mmol/L (ref 22–32)
Calcium: 9 mg/dL (ref 8.9–10.3)
Chloride: 94 mmol/L — ABNORMAL LOW (ref 98–111)
Creatinine, Ser: 2.04 mg/dL — ABNORMAL HIGH (ref 0.61–1.24)
GFR, Estimated: 31 mL/min — ABNORMAL LOW (ref >60)
Glucose, Bld: 116 mg/dL — ABNORMAL HIGH (ref 70–99)
Potassium: 4.1 mmol/L (ref 3.5–5.1)
Sodium: 132 mmol/L — ABNORMAL LOW (ref 135–145)

## 2020-12-01 MED ORDER — MIDODRINE HCL 5 MG PO TABS
5.0000 mg | ORAL_TABLET | Freq: Three times a day (TID) | ORAL | Status: DC
  Administered 2020-12-01 – 2020-12-02 (×4): 5 mg via ORAL
  Filled 2020-12-01 (×4): qty 1

## 2020-12-01 MED ORDER — METHYLPREDNISOLONE SODIUM SUCC 40 MG IJ SOLR
40.0000 mg | INTRAMUSCULAR | Status: DC
  Administered 2020-12-01: 40 mg via INTRAVENOUS
  Filled 2020-12-01 (×2): qty 1

## 2020-12-01 MED ORDER — METOLAZONE 2.5 MG PO TABS
2.5000 mg | ORAL_TABLET | Freq: Every day | ORAL | Status: DC
  Administered 2020-12-01 – 2020-12-02 (×2): 2.5 mg via ORAL
  Filled 2020-12-01 (×2): qty 1

## 2020-12-01 NOTE — Progress Notes (Addendum)
PROGRESS NOTE    [name]  QAS:[id number] DOB: 03/14/1931 DOA: 11/26/2020 PCP: Rusty Aus, MD   Chief complaint.  Shortness of breath. Brief Narrative:   Andres Gutierrez is a 87 y.o. male with medical history significant of CHF, CKD 3B, A. fib, BPH, CAD status post CABG, cerebral amyloid angiopathy, GERD, hyperlipidemia, hypertension, iron deficiency anemia, myelodysplastic syndrome who presents with ongoing increased dyspnea on exertion, edema, weight gain. Echocardiogram performed in May 2022 showed ejection fraction 25 to 30%.  He is started on IV Lasix 7/19.  Patient had a worsening short of breath already this morning, was placed on oxygen for hypoxemia. 7/20 BNP ELEVATED. Pt reports no sob while resting in bed. On lasix gtt.  7/21 still sob. Daughter at bedside. Spoke to pt about his code status while daughter was at bedside, after discussion, he agreed to DNR  status.    Assessment & Plan:   Principal Problem:   Acute exacerbation of CHF (congestive heart failure) (HCC) Active Problems:   CAD (coronary artery disease)   HTN (hypertension)   Chronic atrial fibrillation (HCC)   Dyspnea on exertion   BPH (benign prostatic hyperplasia)   Gastroesophageal reflux disease with esophagitis   Thrombocytopenia (HCC)   HLD (hyperlipidemia)   MDS (myelodysplastic syndrome), low grade (HCC)   Hyperkalemia   Acute renal failure superimposed on stage 3b chronic kidney disease (HCC)   Acute on chronic combined systolic (congestive) and diastolic (congestive) heart failure ([place])   Acute hypoxemic respiratory failure ([place])  #1. Acute on chronic combined systolic and diastolic congestive heart failure. Elevated troponin secondary to congestive heart failure. Acute hypoxemic respiratory failure. Patient had a worsening short of breath earlier this morning, consistent with congestive heart failure exacerbation.  He also requiring oxygen. He had a slightly worsening renal function 2 days  ago, Lasix was reduced to oral torsemide  7/20- was restarted on lasix gtt. Bnp elevated. Will d/w cards about ?  7/21 7/21 patient on 2 L nasal cannula.  Still short of breath at rest. discussed with cardiology about inotrope, Dr. Clayborn Bigness would like to try metolazone first. Will start low-dose metolazone I's and O's Daily weight Palliative care consulted  2.  Acute kidney injury on chronic kidney disease stage IIIb. Hyperkalemia: Currently stable, continue on Lasix drip monitor electrolytes and renal function Nephrology consulted   3.  Coronary disease with status post CABG. Continue cardiac medications  4.  Anemia. Thrombocytopenia. Myelodysplastic syndrome. H&H stable.  Platelets stable.  Monitor periodically   DVT prophylaxis: SCDs Code Status: DNR.  CODE STATUS changed by patient after discussion.  Daughter was witnessed at bedside. Family Communication: None at bedside Disposition Plan:    Status is: Inpatient  Remains inpatient appropriate because:IV treatments appropriate due to intensity of illness or inability to take PO and Inpatient level of care appropriate due to severity of illness  Dispo: The patient is from: Home              Anticipated d/c is to: SNF              Patient currently is not medically stable to d/c.   Difficult to place patient No        I/O last 3 completed shifts: In: 377.4 [P.O.:360; I.V.:17.4] Out: 1250 [Urine:1250] Total I/O In: 180 [P.O.:180] Out: 59 [Urine:50]     Consultants:  card  Procedures: none  Antimicrobials: none   Subjective: Sob mildly while at rest. No cp  Objective: Vitals:   11/30/20 2046 12/01/20 0429 12/01/20 0854 12/01/20 0901  BP: (!) 103/55 (!) 104/57  (!) 101/57  Pulse: 77 66  67  Resp: 20 18  20   Temp: 98.2 F (36.8 C) 97.8 F (36.6 C)  97.6 F (36.4 C)  TempSrc: Oral Oral  Oral  SpO2: 97% 97%  99%  Weight:   81.1 kg   Height:        Intake/Output Summary (Last 24 hours) at  12/01/2020 1334 Last data filed at 12/01/2020 1148 Gross per 24 hour  Intake 180 ml  Output 650 ml  Net -470 ml   Filed Weights   11/29/20 0357 11/30/20 0354 12/01/20 0854  Weight: 81.6 kg 80.9 kg 81.1 kg    Examination: Calm, NAD, daughter at bedside Fine Rales bilaterally, expiratory wheezing Regular S1-S2 no gallops Soft benign positive bowel sounds No edema AA x O x4 grossly intact   Data Reviewed: I have personally reviewed following labs and imaging studies  CBC: Recent Labs  Lab 11/26/20 1645 11/27/20 0436 11/28/20 0412 11/30/20 0538  WBC 6.6 5.8 5.5 8.3  NEUTROABS  --   --  1.8 3.7  HGB 9.0* 8.7* 8.6* 8.7*  HCT 27.9* 25.9* 26.0* 26.2*  MCV 101.8* 103.6* 100.8* 97.8  PLT 110* 71* 88* 82*   Basic Metabolic Panel: Recent Labs  Lab 11/26/20 1536 11/27/20 0436 11/28/20 0412 11/29/20 0556 11/30/20 0538 12/01/20 0837  NA 136 135 135 133* 132* 132*  K 5.5* 4.7 4.1 3.9 4.1 4.1  CL 107 104 104 98 96* 94*  CO2 21* 21* 24 24 23 24   GLUCOSE 101* 85 83 87 93 116*  BUN 45* 44* 46* 45* 48* 50*  CREATININE 2.02* 1.87* 2.08* 1.96* 2.01* 2.04*  CALCIUM 9.3 8.8* 8.9 8.5* 9.2 9.0  MG 1.8  --  1.5* 1.8 1.8  --    GFR: Estimated Creatinine Clearance: 29.3 mL/min (A) (by C-G formula based on SCr of 2.04 mg/dL (H)). Liver Function Tests: Recent Labs  Lab 11/26/20 1536  AST 35  ALT 9  ALKPHOS 54  BILITOT 1.6*  PROT 7.6  ALBUMIN 4.3   No results for input(s): LIPASE, AMYLASE in the last 168 hours. No results for input(s): AMMONIA in the last 168 hours. Coagulation Profile: No results for input(s): INR, PROTIME in the last 168 hours. Cardiac Enzymes: No results for input(s): CKTOTAL, CKMB, CKMBINDEX, TROPONINI in the last 168 hours. BNP (last 3 results) No results for input(s): PROBNP in the last 8760 hours. HbA1C: No results for input(s): HGBA1C in the last 72 hours. CBG: No results for input(s): GLUCAP in the last 168 hours.  Lipid Profile: No results  for input(s): CHOL, HDL, LDLCALC, TRIG, CHOLHDL, LDLDIRECT in the last 72 hours. Thyroid Function Tests: No results for input(s): TSH, T4TOTAL, FREET4, T3FREE, THYROIDAB in the last 72 hours. Anemia Panel: No results for input(s): VITAMINB12, FOLATE, FERRITIN, TIBC, IRON, RETICCTPCT in the last 72 hours.  Sepsis Labs: No results for input(s): PROCALCITON, LATICACIDVEN in the last 168 hours.  Recent Results (from the past 240 hour(s))  Resp Panel by RT-PCR (Flu A&B, Covid) Nasopharyngeal Swab     Status: None   Collection Time: 11/26/20  4:13 PM   Specimen: Nasopharyngeal Swab; Nasopharyngeal(NP) swabs in vial transport medium  Result Value Ref Range Status   SARS Coronavirus 2 by RT PCR NEGATIVE NEGATIVE Final    Comment: (NOTE) SARS-CoV-2 target nucleic acids are NOT DETECTED.  The SARS-CoV-2 RNA is generally  detectable in upper respiratory specimens during the acute phase of infection. The lowest concentration of SARS-CoV-2 viral copies this assay can detect is 138 copies/mL. A negative result does not preclude SARS-Cov-2 infection and should not be used as the sole basis for treatment or other patient management decisions. A negative result may occur with  improper specimen collection/handling, submission of specimen other than nasopharyngeal swab, presence of viral mutation(s) within the areas targeted by this assay, and inadequate number of viral copies(<138 copies/mL). A negative result must be combined with clinical observations, patient history, and epidemiological information. The expected result is Negative.  Fact Sheet for Patients:  EntrepreneurPulse.com.au  Fact Sheet for Healthcare Providers:  IncredibleEmployment.be  This test is no t yet approved or cleared by the [place] FDA and  has been authorized for detection and/or diagnosis of SARS-CoV-2 by FDA under an Emergency Use Authorization (EUA). This EUA will remain  in  effect (meaning this test can be used) for the duration of the COVID-19 declaration under Section 564(b)(1) of the Act, 21 U.S.C.section 360bbb-3(b)(1), unless the authorization is terminated  or revoked sooner.       Influenza A by PCR NEGATIVE NEGATIVE Final   Influenza B by PCR NEGATIVE NEGATIVE Final    Comment: (NOTE) The Xpert Xpress SARS-CoV-2/FLU/RSV plus assay is intended as an aid in the diagnosis of influenza from Nasopharyngeal swab specimens and should not be used as a sole basis for treatment. Nasal washings and aspirates are unacceptable for Xpert Xpress SARS-CoV-2/FLU/RSV testing.  Fact Sheet for Patients: EntrepreneurPulse.com.au  Fact Sheet for Healthcare Providers: IncredibleEmployment.be  This test is not yet approved or cleared by the [place] FDA and has been authorized for detection and/or diagnosis of SARS-CoV-2 by FDA under an Emergency Use Authorization (EUA). This EUA will remain in effect (meaning this test can be used) for the duration of the COVID-19 declaration under Section 564(b)(1) of the Act, 21 U.S.C. section 360bbb-3(b)(1), unless the authorization is terminated or revoked.  Performed at [facility], [address], [place], [place] [address]          Radiology Studies: No results found.      Scheduled Meds:  atorvastatin  40 mg Oral QHS   cephALEXin  500 mg Oral BID   digoxin  0.0625 mg Oral Daily   ferrous sulfate  325 mg Oral Q M,W,F   hydrALAZINE  25 mg Oral BID   isosorbide mononitrate  30 mg Oral Daily   methylPREDNISolone (SOLU-MEDROL) injection  40 mg Intravenous Q24H   metolazone  2.5 mg Oral Daily   metoprolol tartrate  12.5 mg Oral BID   midodrine  5 mg Oral TID WC   ranolazine  500 mg Oral BID   sodium chloride flush  3 mL Intravenous Q12H   sucralfate  1 g Oral BID   tamsulosin  0.4 mg Oral QPC supper   Continuous Infusions:  furosemide (LASIX) 200 mg in  dextrose 5% 100 mL (2mg /mL) infusion 6 mg/hr (12/01/20 1049)     LOS: 4 days    Time spent: 35 minutes, more than 50% time spent on direct patient care.    Nolberto Hanlon, MD [facility]   To contact the attending provider between 7A-7P or the covering provider during after hours 7P-7A, please log into the web site [email or web address] and access using universal [place] password for that web site. If you do not have the password, please call the hospital operator.  12/01/2020, 1:34 PM

## 2020-12-01 NOTE — Progress Notes (Signed)
   Heart Failure Nurse Navigator Note ' HFrEF 25-30%  Labs:  Sodium 132, potassium 4.1, chloride 94, CO2 24, BUN 50, creatinine 2.04, yesterday's value 2.01. Intake 360 mL Output 1250 mL Weight 81.8 kg up from 80.9 of yesterday.    On exam patient has diminished breath sounds in the right base and wheezing.   Met today with the patient and his daughter who was at the bedside.  He states at home for the last few weeks that he did not have much of an appetite, he would just take a couple bites of his food and then would be full.  In talking with the patient he denies any worsening shortness of breath with lying in bed but does become short of breath with activity.  He also is concerned that he is not urinating as much as he feels he should.  Has been seen by cardiology and nephrology. Will continue to follow.  Pricilla Riffle RN [facility]

## 2020-12-01 NOTE — TOC Progression Note (Signed)
Transition of Care [facility]) - Progression Note    Patient Details  Name: Andres Gutierrez MRN: [id number] Date of Birth: 1930-09-14  Transition of Care [place]) CM/SW [place], LCSW Phone Number: 12/01/2020, 10:44 AM  Clinical Narrative:   Insurance authorization denied for SNF placement. Called daughter to update. Gave phone number to call for expedited appeal.  Expected Discharge Plan: [place] Barriers to Discharge: Continued Medical Work up  Expected Discharge Plan and Services Expected Discharge Plan: [place] Choice: [place] arrangements for the past 2 months: Single Family Home                                       Social Determinants of Health (SDOH) Interventions    Readmission Risk Interventions Readmission Risk Prevention Plan 07/30/2019 06/08/2019  Transportation Screening Complete Complete  PCP or Specialist Appt within 3-5 Days Complete Complete  HRI or Home Care Consult Complete Complete  Social Work Consult for [place] Planning/Counseling - Complete  Palliative Care Screening - Not Applicable  Medication Review Press photographer) Complete Complete  Some recent data might be hidden

## 2020-12-01 NOTE — Progress Notes (Signed)
Physical Therapy Treatment Patient Details Name: Andres Gutierrez MRN: [id number] DOB: [birth year] Today's Date: 12/01/2020    History of Present Illness Andres Gutierrez is a 87 y.o. male with medical history significant of CHF, CKD 3B, A. fib, BPH, CAD status post CABG, cerebral amyloid angiopathy, GERD, hyperlipidemia, hypertension, iron deficiency anemia, myelodysplastic syndrome who presents with ongoing increased dyspnea on exertion, edema, weight gain. Pt is being treated for acute on chronic combined systolic & diastolic congestive heart failure.    PT Comments    Pt pleasant t/o session, though on arrival he initially declines doing any activity with PT.  With minimal cuing/encouragement, however, he agreed that it is beneficial to do some activity and get out of the bed.  Pt generally speaking did not need much physical assist, however cuing and supervision with mobility and ambulation, along with consistent light cuing and encouragement needed t/o.  Pt on 2L O2 on arrival sats in mid 90s, maintained 2 L during mobility, donning foot wear (in sitting PT assisted with R shoe, he needed only very light velcro adjustment with L post-op shoe) and slow but consistent ambulation.  He again had some issues with getting walker out in front of him with some correction with cuing.  Pt with no LOBs or overt safety issues, did reports some minimal fatigue but O2 remained in the 90s (generally 91-93%) during activity and HR stayed below 100 despite the prolonged effort.  Pt in recliner post session, pt's daughter arrived with dinner (nursing also in room) further activity deferred.     Follow Up Recommendations  Supervision - Intermittent;Home health PT     Equipment Recommendations  None recommended by PT    Recommendations for Other Services       Precautions / Restrictions Precautions Precautions: Fall Restrictions Weight Bearing Restrictions: No    Mobility  Bed Mobility Overal bed mobility:  Modified Independent Bed Mobility: Supine to Sit     Supine to sit: Min guard     General bed mobility comments: Pt was able to get himself to sitting EOB (and don wpost-op shoe) w/o direct assist, minimal cuing, some extra time    Transfers Overall transfer level: Modified independent Equipment used: Rolling walker (2 wheeled) Transfers: Sit to/from Stand Sit to Stand: Supervision         General transfer comment: from low setting bed pt was able to rise to standing with heavy UE use but no direct assist or safety concerns  Ambulation/Gait Ambulation/Gait assistance: Supervision Gait Distance (Feet): 225 Feet Assistive device: Rolling walker (2 wheeled)       General Gait Details: Pt again with some occasinal forward lean getting walker ahead of him, but no LOBs, O2 remained in the 90s on 2 L O2 (HR did not exceed 100) and though she subjectively reports some fatigue after prolonged effort he did not have any DOE or excessive SOB   Stairs             Wheelchair Mobility    Modified Rankin (Stroke Patients Only)       Balance Overall balance assessment: Needs assistance Sitting-balance support: Feet supported;Bilateral upper extremity supported Sitting balance-[name]: Good     Standing balance support: During functional activity;Bilateral upper extremity supported Standing balance-[name]: Fair                              Cognition Arousal/Alertness: Awake/alert Behavior During Therapy: [facility]  for tasks assessed/performed Overall Cognitive Status: Within Functional Limits for tasks assessed                                        Exercises      General Comments        Pertinent Vitals/Pain Pain Assessment: No/denies pain    Home Living                      Prior Function            PT Goals (current goals can now be found in the care plan section) Progress towards PT goals: Progressing toward  goals    Frequency    Min 2X/week      PT Plan Discharge plan needs to be updated (STR denied by insurance)    Co-evaluation              AM-PAC PT "6 Clicks" Mobility   Outcome Measure  Help needed turning from your back to your side while in a flat bed without using bedrails?: None Help needed moving from lying on your back to sitting on the side of a flat bed without using bedrails?: None Help needed moving to and from a bed to a chair (including a wheelchair)?: A Little Help needed standing up from a chair using your arms (e.g., wheelchair or bedside chair)?: None Help needed to walk in hospital room?: A Little Help needed climbing 3-5 steps with a railing? : A Little 6 Click Score: 21    End of Session Equipment Utilized During Treatment: Oxygen;Gait belt Activity Tolerance: Patient tolerated treatment well Patient left: in chair;with call bell/phone within reach;with family/visitor present;with nursing/sitter in room Nurse Communication: Mobility status PT Visit Diagnosis: Muscle weakness (generalized) (M62.81);Difficulty in walking, not elsewhere classified (R26.2)     Time: 4132-4401 PT Time Calculation (min) (ACUTE ONLY): 27 min  Charges:  $Gait Training: 8-22 mins $Therapeutic Activity: 8-22 mins                     Kreg Shropshire, DPT 12/01/2020, 5:33 PM

## 2020-12-01 NOTE — Consult Note (Addendum)
Consultation Note Date: 12/01/2020   Patient Name: Andres Gutierrez  DOB: 1931/02/04  MRN: [id number]  Age / Sex: 87 y.o., male  PCP: Rusty Aus, MD Referring Physician: Nolberto Hanlon, MD  Reason for Consultation: Establishing goals of care  HPI/Patient Profile: Andres Gutierrez is a 87 y.o. male with medical history significant of CHF, CKD 3B, A. fib, BPH, CAD status post CABG, cerebral amyloid angiopathy, GERD, hyperlipidemia, hypertension, iron deficiency anemia, myelodysplastic syndrome who presents with ongoing increased dyspnea on exertion, edema, weight gain.   Clinical Assessment and Goals of Care: Patient is resting in bed with audible wheezing. No family at bedside.   He states he lives alone. He has 2 children, and no HPOA. He cooks and cleans himself. He states his daughter arranges his pill pack weekly. He tells me the SOB with wheezing began around 5 months ago and has gotten progressively worse.    We discussed his diagnosis, prognosis, GOC, EOL wishes disposition and options. He is able to accurately articulate his status.  A detailed discussion was had today regarding advanced directives.  Concepts specific to code status, artifical feeding and hydration, IV antibiotics and rehospitalization were discussed.  The difference between an aggressive medical intervention path and a comfort care path was discussed.  Values and goals of care important to patient and family were attempted to be elicited.  Discussed limitations of medical interventions to prolong quality of life in some situations and discussed the concept of human mortality.  He states he trusts cardiology and the other medical teams to know what's best and if they believe they can help his status, he would like for them to have the time needed to do so; he states he would not want to continue living in his current state. He confirms  DNR/DNI status.   SUMMARY OF RECOMMENDATIONS   DNR/DNI. Continue to treat the treatable. Patient wants to provide time for cardiology to try to improve his status. Patient is aware he is hospice appropriate should he choose that route. If he improves to D/C home, recommend outpatient palliative.  I will follow up Tuesday on my return.    Prognosis:  Poor       Primary Diagnoses: Present on Admission:  MDS (myelodysplastic syndrome), low grade (HCC)  BPH (benign prostatic hyperplasia)  CAD (coronary artery disease)  Chronic atrial fibrillation (HCC)  Dyspnea on exertion  Gastroesophageal reflux disease with esophagitis  HLD (hyperlipidemia)  HTN (hypertension)  Thrombocytopenia (HCC)  Acute on chronic combined systolic (congestive) and diastolic (congestive) heart failure ([place])   I have reviewed the medical record, interviewed the patient and family, and examined the patient. The following aspects are pertinent.  Past Medical History:  Diagnosis Date   Acalculous cholecystitis 10/29/2015   Acute on chronic heart failure ([place]) 06/07/2019   Anemia    Anginal pain (HCC)    Anxiety    Atrial fibrillation with RVR ([place]) 12/23/2015   Cardiomyopathy ([place])    Cerebral amyloid angiopathy (HCC)    CHF (  congestive heart failure) (HCC)    Chronic kidney disease    kidney stones   Coronary artery disease    Cough    Dyspnea    GERD (gastroesophageal reflux disease)    GERD (gastroesophageal reflux disease) 12/23/2015   History of kidney stones    Hypertension    Lymphadenopathy, hilar    MDS (myelodysplastic syndrome) (HCC)    MI (myocardial infarction) ([place])    x 2   Wheezing    Social History   Socioeconomic History   Marital status: Widowed    Spouse name: Not on file   Number of children: 2   Years of education: college   Highest education level: Some college, no degree  Occupational History   Occupation: retired  Tobacco Use   Smoking status: Former   Smokeless  tobacco: Current    Types: Nurse, children's Use: Never used  Substance and Sexual Activity   Alcohol use: Yes    Comment: occational almost rare once a year   Drug use: No   Sexual activity: Not Currently    Birth control/protection: Abstinence  Other Topics Concern   Not on file  Social History Narrative   Not on file   Social Determinants of Health   Financial Resource Strain: Not on file  Food Insecurity: Not on file  Transportation Needs: Not on file  Physical Activity: Not on file  Stress: Not on file  Social Connections: Not on file   Family History  Problem Relation Age of Onset   CAD Mother    Colon cancer Father    Scheduled Meds:  atorvastatin  40 mg Oral QHS   cephALEXin  500 mg Oral BID   digoxin  0.0625 mg Oral Daily   ferrous sulfate  325 mg Oral Q M,W,F   hydrALAZINE  25 mg Oral BID   isosorbide mononitrate  30 mg Oral Daily   methylPREDNISolone (SOLU-MEDROL) injection  40 mg Intravenous Q24H   metolazone  2.5 mg Oral Daily   metoprolol tartrate  12.5 mg Oral BID   midodrine  5 mg Oral TID WC   ranolazine  500 mg Oral BID   sodium chloride flush  3 mL Intravenous Q12H   sucralfate  1 g Oral BID   tamsulosin  0.4 mg Oral QPC supper   Continuous Infusions:  furosemide (LASIX) 200 mg in dextrose 5% 100 mL (2mg /mL) infusion 6 mg/hr (12/01/20 1049)   PRN Meds:.acetaminophen **OR** acetaminophen, ipratropium-albuterol, polyethylene glycol Medications Prior to Admission:  Prior to Admission medications   Medication Sig Start Date End Date Taking? Authorizing Provider  acetaminophen (TYLENOL) 500 MG tablet Take 500 mg by mouth 2 (two) times daily as needed for mild pain or moderate pain.   Yes [provider]  Ascorbic Acid (VITAMIN C PO) Take 1 tablet by mouth daily with breakfast.   Yes [provider]  atorvastatin (LIPITOR) 40 MG tablet Take 1 tablet (40 mg total) by mouth daily with supper. Patient taking differently: Take  40 mg by mouth every evening. 02/27/20  Yes Enzo Bi, MD  cephALEXin (KEFLEX) 500 MG capsule Take 500 mg by mouth 3 (three) times daily. 11/24/20  Yes [provider]  digoxin (LANOXIN) 0.125 MG tablet Take 0.0625 mg by mouth daily.   Yes [provider]  famotidine (PEPCID) 20 MG tablet Take 20 mg by mouth at bedtime. 02/04/19  Yes [provider]  ferrous sulfate 325 (65 FE) MG tablet  Take 1 tablet (325 mg total) by mouth every Monday, Wednesday, and Friday. With supper 02/29/20  Yes Enzo Bi, MD  furosemide (LASIX) 40 MG tablet Take 20 mg by mouth See admin instructions. Take 20 mg by mouth for weight gain for 7 days starting 07.14.2022   Yes [provider]  metoprolol tartrate (LOPRESSOR) 25 MG tablet Take 12.5 mg by mouth 2 (two) times daily.   Yes [provider]  Multiple Vitamin (MULTIVITAMIN) tablet Take 1 tablet by mouth daily.   Yes [provider]  pantoprazole (PROTONIX) 40 MG tablet Take 1 tablet (40 mg total) by mouth 2 (two) times daily. 08/17/20  Yes Lorella Nimrod, MD  polyethylene glycol (MIRALAX / GLYCOLAX) 17 g packet Take 17 g by mouth daily as needed for mild constipation. 08/17/20  Yes Lorella Nimrod, MD  ranolazine (RANEXA) 1000 MG SR tablet Take 500 mg by mouth 2 (two) times daily.  01/16/19  Yes [provider]  sucralfate (CARAFATE) 1 G tablet Take 1 g by mouth 2 (two) times daily.    Yes [provider]  tamsulosin (FLOMAX) 0.4 MG CAPS capsule Take 0.4 mg by mouth daily after supper.    Yes [provider]  vitamin B-12 (CYANOCOBALAMIN) 500 MCG tablet Take 500 mcg by mouth daily.   Yes [provider]  furosemide (LASIX) 20 MG tablet Take 1 tablet (20 mg total) by mouth daily. Patient not taking: Reported on 11/26/2020 10/04/20   Lorella Nimrod, MD   Allergies  Allergen Reactions   Levaquin [Levofloxacin] Other (See Comments)    [name], her daughter, said Levaquin should be avoided because  when patient had Levaquin a few years ago, he had "a bad reaction".  She said he could not walk and could not lift up his feet.   Escitalopram Other (See Comments)    Reaction:  Makes him feel faint, like he was going to have a heart attack.   5ht3 Receptor Antagonists Other (See Comments)   Diltiazem Rash   Diphenhydramine Hcl Other (See Comments)    Reaction:  Rash and fever a long time ago, but has had it since with no problem.   Maxidex [Dexamethasone] Other (See Comments)    Reaction:  Unknown    Prednisone Other (See Comments)    Pt states that this medication made him feel crazy.     Serotonin Other (See Comments)    Tried 2 different types, lexapro and another one.  Reaction:  Made him feel like he was having a heart attack.    Vytorin [Ezetimibe-Simvastatin] Other (See Comments)    Reaction:  Unknown    Zocor [Simvastatin] Other (See Comments)    Reaction:  Unknown    Review of Systems  Respiratory:  Positive for wheezing.    Physical Exam Pulmonary:     Comments: Audible wheezing.  Some WOB noted.  Neurological:     Mental Status: He is alert.    Vital Signs: BP (!) 101/57 (BP Location: Left Arm, Patient Position: Sitting)   Pulse 67   Temp 97.6 F (36.4 C) (Oral)   Resp 20   Ht 6\' 4"  (1.93 m)   Wt 81.1 kg Comment: standing  SpO2 99%   BMI 21.76 kg/m  Pain Scale: 0-10 POSS *See Group Information*: 1-Acceptable,Awake and alert Pain Score: 0-No pain   SpO2: SpO2: 99 % O2 Device:SpO2: 99 % O2 Flow Rate: .O2 Flow Rate (L/min): 2 L/min  IO: Intake/output summary:  Intake/Output Summary (Last 24  hours) at 12/01/2020 1523 Last data filed at 12/01/2020 1424 Gross per 24 hour  Intake 420 ml  Output 650 ml  Net -230 ml    LBM: Last BM Date: 12/01/20 Baseline Weight: Weight: 83.9 kg Most recent weight: Weight: 81.1 kg (standing)       Time In: 3:00 Time Out: 3:30 Time Total: 30 min Greater than 50%  of this time was spent counseling and coordinating care  related to the above assessment and plan.  Signed by: Asencion Gowda, NP   Please contact Palliative Medicine Team phone at [phone] for questions and concerns.  For individual provider: See Shea Evans

## 2020-12-01 NOTE — Consult Note (Signed)
[facility] Consult Note:    Date of Admission:  11/26/2020           Reason for Consult:     Referring Provider: Nolberto Hanlon, MD Primary Care Provider: Rusty Aus, MD   History of Presenting Illness:  Andres Gutierrez is a 87 y.o. male with medical problems of congestive heart failure, chronic kidney disease, atrial fibrillation, BPH, coronary disease status post CABG, cerebral amyloid angiopathy, GERD, hyperlipidemia, hypertension and myelodysplastic syndrome.  He was originally admitted for increased dyspnea on exertion, worsening edema and weight gain. Information is provided by patient's daughter and is obtained from the chart She states that in his usual state of health, patient is able to ambulate with walker.  He does get short of breath with exertion but for the past 2 weeks prior to admission, clinical symptoms were worsening. Patient has baseline renal insufficiency with creatinine of 1.7/GFR 38 This admission, creatinine has been higher than baseline and is fluctuating between 1.87-2.04 Patient is currently getting diuresis with IV Lasix He is currently requiring oxygen supplementation which is not the case at home prior to admission   Review of Systems: ROS Gen: Denies any fevers or chills HEENT: No vision or hearing problems CV: No chest pain but does have exertional shortness of breath Resp: No cough or sputum production GI: No nausea, vomiting or diarrhea.  No blood in the stool GU : No problems with voiding.  No hematuria.  MS: Ambulatory with walker at home.  Denies any acute joint pain or swelling, but does have chronic knee arthritis Derm:   No complaints Psych: No complaints Heme: No complaints Neuro: No complaints Endocrine: No complaints   Past Medical History:  Diagnosis Date   Acalculous cholecystitis 10/29/2015   Acute on chronic heart failure ([place]) 06/07/2019   Anemia    Anginal pain (HCC)    Anxiety    Atrial fibrillation  with RVR ([place]) 12/23/2015   Cardiomyopathy (HCC)    Cerebral amyloid angiopathy (HCC)    CHF (congestive heart failure) (HCC)    Chronic kidney disease    kidney stones   Coronary artery disease    Cough    Dyspnea    GERD (gastroesophageal reflux disease)    GERD (gastroesophageal reflux disease) 12/23/2015   History of kidney stones    Hypertension    Lymphadenopathy, hilar    MDS (myelodysplastic syndrome) (HCC)    MI (myocardial infarction) ([place])    x 2   Wheezing     Social History   Tobacco Use   Smoking status: Former   Smokeless tobacco: Current    Types: Nurse, children's Use: Never used  Substance Use Topics   Alcohol use: Yes    Comment: occational almost rare once a year   Drug use: No    Family History  Problem Relation Age of Onset   CAD Mother    Colon cancer Father      OBJECTIVE: Blood pressure (!) 101/57, pulse 67, temperature 97.6 F (36.4 C), temperature source Oral, resp. rate 20, height 6\' 4"  (1.93 m), weight 81.1 kg, SpO2 99 %.  Physical Exam  Physical Exam: General:  No acute distress, laying in the bed  HEENT  anicteric, moist oral mucous membrane  Pulm/lungs  normal breathing effort, [place] O2, mils basilar crackles  CVS/Heart  regular rhythm, no rub or gallop  Abdomen:   Soft, nontender  Extremities:  No peripheral edema  Neurologic:  Alert, oriented, able to follow commands  Skin:  No acute rashes     Lab Results Lab Results  Component Value Date   WBC 8.3 11/30/2020   HGB 8.7 (L) 11/30/2020   HCT 26.2 (L) 11/30/2020   MCV 97.8 11/30/2020   PLT 82 (L) 11/30/2020    Lab Results  Component Value Date   CREATININE 2.04 (H) 12/01/2020   BUN 50 (H) 12/01/2020   NA 132 (L) 12/01/2020   K 4.1 12/01/2020   CL 94 (L) 12/01/2020   CO2 24 12/01/2020    Lab Results  Component Value Date   ALT 9 11/26/2020   AST 35 11/26/2020   ALKPHOS 54 11/26/2020   BILITOT 1.6 (H) 11/26/2020     Microbiology: Recent Results  (from the past 240 hour(s))  Resp Panel by RT-PCR (Flu A&B, Covid) Nasopharyngeal Swab     Status: None   Collection Time: 11/26/20  4:13 PM   Specimen: Nasopharyngeal Swab; Nasopharyngeal(NP) swabs in vial transport medium  Result Value Ref Range Status   SARS Coronavirus 2 by RT PCR NEGATIVE NEGATIVE Final    Comment: (NOTE) SARS-CoV-2 target nucleic acids are NOT DETECTED.  The SARS-CoV-2 RNA is generally detectable in upper respiratory specimens during the acute phase of infection. The lowest concentration of SARS-CoV-2 viral copies this assay can detect is 138 copies/mL. A negative result does not preclude SARS-Cov-2 infection and should not be used as the sole basis for treatment or other patient management decisions. A negative result may occur with  improper specimen collection/handling, submission of specimen other than nasopharyngeal swab, presence of viral mutation(s) within the areas targeted by this assay, and inadequate number of viral copies(<138 copies/mL). A negative result must be combined with clinical observations, patient history, and epidemiological information. The expected result is Negative.  Fact Sheet for Patients:  EntrepreneurPulse.com.au  Fact Sheet for Healthcare Providers:  IncredibleEmployment.be  This test is no t yet approved or cleared by the [place] FDA and  has been authorized for detection and/or diagnosis of SARS-CoV-2 by FDA under an Emergency Use Authorization (EUA). This EUA will remain  in effect (meaning this test can be used) for the duration of the COVID-19 declaration under Section 564(b)(1) of the Act, 21 U.S.C.section 360bbb-3(b)(1), unless the authorization is terminated  or revoked sooner.       Influenza A by PCR NEGATIVE NEGATIVE Final   Influenza B by PCR NEGATIVE NEGATIVE Final    Comment: (NOTE) The Xpert Xpress SARS-CoV-2/FLU/RSV plus assay is intended as an aid in the  diagnosis of influenza from Nasopharyngeal swab specimens and should not be used as a sole basis for treatment. Nasal washings and aspirates are unacceptable for Xpert Xpress SARS-CoV-2/FLU/RSV testing.  Fact Sheet for Patients: EntrepreneurPulse.com.au  Fact Sheet for Healthcare Providers: IncredibleEmployment.be  This test is not yet approved or cleared by the [place] FDA and has been authorized for detection and/or diagnosis of SARS-CoV-2 by FDA under an Emergency Use Authorization (EUA). This EUA will remain in effect (meaning this test can be used) for the duration of the COVID-19 declaration under Section 564(b)(1) of the Act, 21 U.S.C. section 360bbb-3(b)(1), unless the authorization is terminated or revoked.  Performed at [facility], [address] [place], [place] [address]     Medications: Scheduled Meds:  atorvastatin  40 mg Oral QHS   cephALEXin  500 mg Oral BID   digoxin  0.0625 mg Oral Daily   ferrous sulfate  325  mg Oral Q M,W,F   hydrALAZINE  25 mg Oral BID   isosorbide mononitrate  30 mg Oral Daily   methylPREDNISolone (SOLU-MEDROL) injection  40 mg Intravenous Q24H   metolazone  2.5 mg Oral Daily   metoprolol tartrate  12.5 mg Oral BID   midodrine  5 mg Oral TID WC   ranolazine  500 mg Oral BID   sodium chloride flush  3 mL Intravenous Q12H   sucralfate  1 g Oral BID   tamsulosin  0.4 mg Oral QPC supper   Continuous Infusions:  furosemide (LASIX) 200 mg in dextrose 5% 100 mL (2mg /mL) infusion 6 mg/hr (12/01/20 1049)   PRN Meds:.acetaminophen **OR** acetaminophen, ipratropium-albuterol, polyethylene glycol  Allergies  Allergen Reactions   Levaquin [Levofloxacin] Other (See Comments)    [name], her daughter, said Levaquin should be avoided because when patient had Levaquin a few years ago, he had "a bad reaction".  She said he could not walk and could not lift up his feet.   Escitalopram Other (See  Comments)    Reaction:  Makes him feel faint, like he was going to have a heart attack.   5ht3 Receptor Antagonists Other (See Comments)   Diltiazem Rash   Diphenhydramine Hcl Other (See Comments)    Reaction:  Rash and fever a long time ago, but has had it since with no problem.   Maxidex [Dexamethasone] Other (See Comments)    Reaction:  Unknown    Prednisone Other (See Comments)    Pt states that this medication made him feel crazy.     Serotonin Other (See Comments)    Tried 2 different types, lexapro and another one.  Reaction:  Made him feel like he was having a heart attack.    Vytorin [Ezetimibe-Simvastatin] Other (See Comments)    Reaction:  Unknown    Zocor [Simvastatin] Other (See Comments)    Reaction:  Unknown     Urinalysis: No results for input(s): COLORURINE, LABSPEC, PHURINE, GLUCOSEU, HGBUR, BILIRUBINUR, KETONESUR, PROTEINUR, UROBILINOGEN, NITRITE, LEUKOCYTESUR in the last 72 hours.  Invalid input(s): APPERANCEUR    Imaging: No results found.    Assessment/Plan:  KENDRELL LOTTMAN is a 87 y.o. male with medical problems of   congestive heart failure, chronic kidney disease, atrial fibrillation, BPH, coronary disease status post CABG, cerebral amyloid angiopathy, GERD, hyperlipidemia, hypertension and myelodysplastic syndrome was admitted on 11/26/2020 for :  Acute exacerbation of CHF (congestive heart failure) (HCC) [I50.9] Dyspnea, unspecified type [R06.00] Acute on chronic congestive heart failure, unspecified heart failure type ([place]) [I50.9] Acute on chronic combined systolic (congestive) and diastolic (congestive) heart failure ([place]) [I50.43]  # CKD st 3B with sub acute worsening  -CKD risk factors include atherosclerosis, hypertension, advanced age -Urinalysis in May is negative for protein and blood -Imaging: CT abdomen pelvis without contrast May 2022, Scattered bilateral cysts, 2 mm nonobstructing stone in the left kidney, no hydronephrosis  # Volume  overload and acute on chronic systolic and diastolic CHF -2D echo 4/58/0998-PJAS 25 to 30%, severely decreased function with global hypokinesis, moderate LVH, grade 2 diastolic dysfunction -Patient is experiencing symptomatic shortness of breath and is requiring oxygen supplementation which is worse compared to baseline -Agree with IV furosemide infusion, increase dose to 6 mg/h (from 4 mg/h) -Agree with metolazone -If improvement in symptoms is not satisfactory, may end up needing inotropic therapy.  Laterrica Libman Candiss Norse 12/01/20

## 2020-12-01 NOTE — Progress Notes (Signed)
Brief cardiology progress note Case discussed with hospitalist and nephrology Patient has persistent progressive heart failure renal insufficiency and COPD Patient is not responding to IV diuretic therapy Patient is also mildly hypotensive no chest pain still wheezing and dyspneic My goal is to start ARB ACE or Entresto if nephrology is in agreement If we are unable to do so then I will advance hydralazine imdur as a second option Plan to continue digoxin for now If he does not respond to Lasix drip and metolazone will consider dopamine or milrinone for inotropic support. I am not totally in favor of doing that yet because I am not sure what the endpoint would be.  Even if I resolve his heart failure now if I am not able to treat him appropriately with ACE ARB or Entresto beta-blocker spironolactone diuretics the patient will have recurrent heart failure and recurrent hospitalizations.  My goal is to find a long-term treatment strategy to keep him out of the hospital. I do not believe this patient is a good candidate for mechanical hemodynamic support like an LVAD .  And I do not believe that this patient is having recurrent ischemic episodes contributing to his heart failure so I do not recommend cardiac cath and his renal function is also prohibitive. My hope would be aggressive medical therapy for his renal insufficiency his COPD with wheezing as well as his congestive heart failure and ischemic cardiomyopathy. Hopefully with nephrology support hospitalist we can all come to treatment strategy that would benefit the patient long-term. Andres Gutierrez

## 2020-12-02 ENCOUNTER — Inpatient Hospital Stay: Payer: PPO

## 2020-12-02 DIAGNOSIS — N179 Acute kidney failure, unspecified: Secondary | ICD-10-CM | POA: Diagnosis not present

## 2020-12-02 DIAGNOSIS — I5043 Acute on chronic combined systolic (congestive) and diastolic (congestive) heart failure: Secondary | ICD-10-CM | POA: Diagnosis not present

## 2020-12-02 DIAGNOSIS — J9601 Acute respiratory failure with hypoxia: Secondary | ICD-10-CM | POA: Diagnosis not present

## 2020-12-02 DIAGNOSIS — N1832 Chronic kidney disease, stage 3b: Secondary | ICD-10-CM | POA: Diagnosis not present

## 2020-12-02 LAB — BASIC METABOLIC PANEL
Anion gap: 11 (ref 5–15)
BUN: 59 mg/dL — ABNORMAL HIGH (ref 8–23)
CO2: 22 mmol/L (ref 22–32)
Calcium: 8.8 mg/dL — ABNORMAL LOW (ref 8.9–10.3)
Chloride: 97 mmol/L — ABNORMAL LOW (ref 98–111)
Creatinine, Ser: 2.26 mg/dL — ABNORMAL HIGH (ref 0.61–1.24)
GFR, Estimated: 27 mL/min — ABNORMAL LOW (ref >60)
Glucose, Bld: 149 mg/dL — ABNORMAL HIGH (ref 70–99)
Potassium: 4.6 mmol/L (ref 3.5–5.1)
Sodium: 130 mmol/L — ABNORMAL LOW (ref 135–145)

## 2020-12-02 LAB — BRAIN NATRIURETIC PEPTIDE: B Natriuretic Peptide: 2513 pg/mL — ABNORMAL HIGH (ref 0.0–100.0)

## 2020-12-02 MED ORDER — TORSEMIDE 20 MG PO TABS: 40.0000 mg | ORAL_TABLET | Freq: Every day | ORAL | Status: DC

## 2020-12-02 MED ORDER — METHYLPREDNISOLONE SODIUM SUCC 40 MG IJ SOLR
40.0000 mg | Freq: Every day | INTRAMUSCULAR | Status: DC
  Administered 2020-12-02: 40 mg via INTRAVENOUS

## 2020-12-02 MED ORDER — METOLAZONE 2.5 MG PO TABS
2.5000 mg | ORAL_TABLET | ORAL | Status: DC
  Administered 2020-12-05 – 2020-12-07 (×2): 2.5 mg via ORAL
  Filled 2020-12-02 (×2): qty 1

## 2020-12-02 MED ORDER — MIDODRINE HCL 5 MG PO TABS
10.0000 mg | ORAL_TABLET | Freq: Three times a day (TID) | ORAL | Status: DC
  Administered 2020-12-02 – 2020-12-06 (×11): 10 mg via ORAL
  Filled 2020-12-02 (×11): qty 2

## 2020-12-02 MED ORDER — SODIUM CHLORIDE 0.9 % IV BOLUS: 250.0000 mL | Freq: Once | INTRAVENOUS | Status: DC

## 2020-12-02 MED ORDER — TORSEMIDE 20 MG PO TABS: 40.0000 mg | ORAL_TABLET | Freq: Two times a day (BID) | ORAL | Status: DC

## 2020-12-02 MED ORDER — SODIUM CHLORIDE 0.9 % IV BOLUS: 125.0000 mL | Freq: Once | INTRAVENOUS | Status: DC

## 2020-12-02 MED ORDER — TORSEMIDE 20 MG PO TABS
40.0000 mg | ORAL_TABLET | Freq: Every day | ORAL | Status: DC
  Administered 2020-12-03: 40 mg via ORAL
  Filled 2020-12-02: qty 2

## 2020-12-02 MED ORDER — SODIUM CHLORIDE 0.9 % IV BOLUS
250.0000 mL | Freq: Once | INTRAVENOUS | Status: AC
  Administered 2020-12-02: 250 mL via INTRAVENOUS

## 2020-12-02 NOTE — Progress Notes (Signed)
Sent Dr. Kurtis Bushman secure chat and let her know that patient's total urine output for this shift is 125 cc and that bladder was scanned and result is 125 cc. Dr. Candiss Norse was added to secure chat and he gave order for 1 x bolus of 250 cc of normal saline.

## 2020-12-02 NOTE — Progress Notes (Signed)
[facility] Cardiology    SUBJECTIVE: Patient has less shortness of breath no chest pain even his blood pressure is improved continue diuresis.  Patient is improving well enough that hopefully we can think about discharging soon on p.o. meds   Vitals:   12/02/20 0604 12/02/20 0749 12/02/20 1047 12/02/20 1151  BP:  (!) 110/54  (!) 96/57  Pulse:  78 78 63  Resp:  17  17  Temp:  98.6 F (37 C)  97.6 F (36.4 C)  TempSrc:  Oral  Oral  SpO2:  94%  98%  Weight: 78.6 kg     Height:         Intake/Output Summary (Last 24 hours) at 12/02/2020 1335 Last data filed at 12/02/2020 1254 Gross per 24 hour  Intake 712.36 ml  Output 825 ml  Net -112.64 ml      PHYSICAL EXAM  General: Well developed, well nourished, in no acute distress HEENT:  Normocephalic and atramatic Neck:  No JVD.  Lungs: Clear bilaterally to auscultation and percussion. Heart: HRRR . Normal S1 and S2 without gallops or murmurs.  Abdomen: Bowel sounds are positive, abdomen soft and non-tender  Msk:  Back normal, normal gait. Normal strength and tone for age. Extremities: No clubbing, cyanosis or edema.   Neuro: Alert and oriented X 3. Psych:  Good affect, responds appropriately   LABS: Basic Metabolic Panel: Recent Labs    11/30/20 0538 12/01/20 0837 12/02/20 0444  NA 132* 132* 130*  K 4.1 4.1 4.6  CL 96* 94* 97*  CO2 23 24 22   GLUCOSE 93 116* 149*  BUN 48* 50* 59*  CREATININE 2.01* 2.04* 2.26*  CALCIUM 9.2 9.0 8.8*  MG 1.8  --   --    Liver Function Tests: No results for input(s): AST, ALT, ALKPHOS, BILITOT, PROT, ALBUMIN in the last 72 hours. No results for input(s): LIPASE, AMYLASE in the last 72 hours. CBC: Recent Labs    11/30/20 0538  WBC 8.3  NEUTROABS 3.7  HGB 8.7*  HCT 26.2*  MCV 97.8  PLT 82*   Cardiac Enzymes: No results for input(s): CKTOTAL, CKMB, CKMBINDEX, TROPONINI in the last 72 hours. BNP: Invalid input(s): POCBNP D-Dimer: No results for input(s): DDIMER in the last 72  hours. Hemoglobin A1C: No results for input(s): HGBA1C in the last 72 hours. Fasting Lipid Panel: No results for input(s): CHOL, HDL, LDLCALC, TRIG, CHOLHDL, LDLDIRECT in the last 72 hours. Thyroid Function Tests: No results for input(s): TSH, T4TOTAL, T3FREE, THYROIDAB in the last 72 hours.  Invalid input(s): FREET3 Anemia Panel: No results for input(s): VITAMINB12, FOLATE, FERRITIN, TIBC, IRON, RETICCTPCT in the last 72 hours.  DG Chest 2 View  Result Date: 12/02/2020 CLINICAL DATA:  CHF. EXAM: CHEST - 2 VIEW COMPARISON:  Chest radiograph 11/29/2020. FINDINGS: Post median sternotomy and CABG. Cardiomegaly is not significantly changed. Persistent but improving pulmonary edema. Persistent but improving bilateral pleural effusions, right greater than left. Associated basilar opacities consistent with compressive atelectasis. There is fluid in the fissures. No pneumothorax or confluent airspace disease. Stable osseous structures. IMPRESSION: Improving CHF with persistent but improving pulmonary edema and bilateral pleural effusions. Cardiomegaly is not significantly changed. Electronically Signed   By: Keith Rake M.D.   On: 12/02/2020 10:06     Echo moderate to severely depressed left ventricular function globally EF around 30%  TELEMETRY: Baseline 70 nonspecific findings:  ASSESSMENT AND PLAN:  Principal Problem:   Acute exacerbation of CHF (congestive heart failure) ([place]) Active Problems:  CAD (coronary artery disease)   HTN (hypertension)   Chronic atrial fibrillation (HCC)   Dyspnea on exertion   BPH (benign prostatic hyperplasia)   Gastroesophageal reflux disease with esophagitis   Thrombocytopenia (HCC)   HLD (hyperlipidemia)   MDS (myelodysplastic syndrome), low grade (HCC)   Hyperkalemia   Acute renal failure superimposed on stage 3b chronic kidney disease (HCC)   Acute on chronic combined systolic (congestive) and diastolic (congestive) heart failure ([place])   Acute  hypoxemic respiratory failure ([place])    Plan Patient has had significant improvement in symptoms with increased dose of continuous Lasix drip.  Would consider switching to p.o. torsemide 40 twice a day Okay to maintain low-dose metolazone may not needed daily but may be every other day or 3 times a week Maintain low-dose digoxin follow-up dig level Okay to continue imdur hydralazine for heart failure cardiomyopathy Must have the patient follow-up with nephrology as an outpatient because of significant renal insufficiency Continue Lipitor therapy for lipid management My hope is to institute ACE ARB or Entresto at a later date once cleared by nephrology this will replace the hydralazine imdur Must consider AICD but we will not do that until seen as an outpatient Increase activity if the patient does reasonably well will consider discharge within the next 24 to 48 hours on p.o.  Have the patient follow-up with heart failure clinic    Yolonda Kida, MD 12/02/2020 1:35 PM

## 2020-12-02 NOTE — Progress Notes (Signed)
[facility]  ROUNDING NOTE   Subjective:   Andres Gutierrez is a 87 y.o. male with medical problems of congestive heart failure, chronic kidney disease, atrial fibrillation, BPH, coronary disease status post CABG, cerebral amyloid angiopathy, GERD, hyperlipidemia, hypertension and myelodysplastic syndrome.  He was originally admitted for increased dyspnea on exertion, worsening edema and weight gain.  Patient seen sitting at side of bed eating breakfast Alert and oriented Daughter at bedside, states he looks much better this morning Denies nausea and vomiting Denies shortness of breath  Lasix drip 6mg /hr  Objective:  Vital signs in last 24 hours:  Temp:  [97.6 F (36.4 C)-98.6 F (37 C)] 97.6 F (36.4 C) (07/22 1151) Pulse Rate:  [63-79] 63 (07/22 1151) Resp:  [17-20] 17 (07/22 1151) BP: (96-120)/(54-70) 96/57 (07/22 1151) SpO2:  [94 %-98 %] 98 % (07/22 1151) Weight:  [78.6 kg] 78.6 kg (07/22 0604)  Weight change:  Filed Weights   11/30/20 0354 12/01/20 0854 12/02/20 0604  Weight: 80.9 kg 81.1 kg 78.6 kg    Intake/Output: I/O last 3 completed shifts: In: 652.4 [P.O.:540; I.V.:112.4] Out: 1250 [Urine:1250]   Intake/Output this shift:  No intake/output data recorded.  Physical Exam: General: NAD, sitting at bedside  Head: Normocephalic, atraumatic. Moist oral mucosal membranes  Eyes: Anicteric  Lungs:  LLL Wheeze, improved throughout, [place] 2L  Heart: Regular rate and rhythm  Abdomen:  Soft, nontender  Extremities:  no peripheral edema.  Neurologic: Nonfocal, moving all four extremities  Skin: No lesions       Basic Metabolic Panel: Recent Labs  Lab 11/26/20 1536 11/27/20 0436 11/28/20 0412 11/29/20 0556 11/30/20 0538 12/01/20 0837 12/02/20 0444  NA 136   < > 135 133* 132* 132* 130*  K 5.5*   < > 4.1 3.9 4.1 4.1 4.6  CL 107   < > 104 98 96* 94* 97*  CO2 21*   < > 24 24 23 24 22   GLUCOSE 101*   < > 83 87 93 116* 149*  BUN 45*   < > 46* 45* 48*  50* 59*  CREATININE 2.02*   < > 2.08* 1.96* 2.01* 2.04* 2.26*  CALCIUM 9.3   < > 8.9 8.5* 9.2 9.0 8.8*  MG 1.8  --  1.5* 1.8 1.8  --   --    < > = values in this interval not displayed.    Liver Function Tests: Recent Labs  Lab 11/26/20 1536  AST 35  ALT 9  ALKPHOS 54  BILITOT 1.6*  PROT 7.6  ALBUMIN 4.3   No results for input(s): LIPASE, AMYLASE in the last 168 hours. No results for input(s): AMMONIA in the last 168 hours.  CBC: Recent Labs  Lab 11/26/20 1645 11/27/20 0436 11/28/20 0412 11/30/20 0538  WBC 6.6 5.8 5.5 8.3  NEUTROABS  --   --  1.8 3.7  HGB 9.0* 8.7* 8.6* 8.7*  HCT 27.9* 25.9* 26.0* 26.2*  MCV 101.8* 103.6* 100.8* 97.8  PLT 110* 71* 88* 82*    Cardiac Enzymes: No results for input(s): CKTOTAL, CKMB, CKMBINDEX, TROPONINI in the last 168 hours.  BNP: Invalid input(s): POCBNP  CBG: No results for input(s): GLUCAP in the last 168 hours.  Microbiology: Results for orders placed or performed during the hospital encounter of 11/26/20  Resp Panel by RT-PCR (Flu A&B, Covid) Nasopharyngeal Swab     Status: None   Collection Time: 11/26/20  4:13 PM   Specimen: Nasopharyngeal Swab; Nasopharyngeal(NP) swabs in vial transport  medium  Result Value Ref Range Status   SARS Coronavirus 2 by RT PCR NEGATIVE NEGATIVE Final    Comment: (NOTE) SARS-CoV-2 target nucleic acids are NOT DETECTED.  The SARS-CoV-2 RNA is generally detectable in upper respiratory specimens during the acute phase of infection. The lowest concentration of SARS-CoV-2 viral copies this assay can detect is 138 copies/mL. A negative result does not preclude SARS-Cov-2 infection and should not be used as the sole basis for treatment or other patient management decisions. A negative result may occur with  improper specimen collection/handling, submission of specimen other than nasopharyngeal swab, presence of viral mutation(s) within the areas targeted by this assay, and inadequate number of  viral copies(<138 copies/mL). A negative result must be combined with clinical observations, patient history, and epidemiological information. The expected result is Negative.  Fact Sheet for Patients:  EntrepreneurPulse.com.au  Fact Sheet for Healthcare Providers:  IncredibleEmployment.be  This test is no t yet approved or cleared by the [place] FDA and  has been authorized for detection and/or diagnosis of SARS-CoV-2 by FDA under an Emergency Use Authorization (EUA). This EUA will remain  in effect (meaning this test can be used) for the duration of the COVID-19 declaration under Section 564(b)(1) of the Act, 21 U.S.C.section 360bbb-3(b)(1), unless the authorization is terminated  or revoked sooner.       Influenza A by PCR NEGATIVE NEGATIVE Final   Influenza B by PCR NEGATIVE NEGATIVE Final    Comment: (NOTE) The Xpert Xpress SARS-CoV-2/FLU/RSV plus assay is intended as an aid in the diagnosis of influenza from Nasopharyngeal swab specimens and should not be used as a sole basis for treatment. Nasal washings and aspirates are unacceptable for Xpert Xpress SARS-CoV-2/FLU/RSV testing.  Fact Sheet for Patients: EntrepreneurPulse.com.au  Fact Sheet for Healthcare Providers: IncredibleEmployment.be  This test is not yet approved or cleared by the [place] FDA and has been authorized for detection and/or diagnosis of SARS-CoV-2 by FDA under an Emergency Use Authorization (EUA). This EUA will remain in effect (meaning this test can be used) for the duration of the COVID-19 declaration under Section 564(b)(1) of the Act, 21 U.S.C. section 360bbb-3(b)(1), unless the authorization is terminated or revoked.  Performed at [facility], [address] [place], [place] [address]     Coagulation Studies: No results for input(s): LABPROT, INR in the last 72 hours.  Urinalysis: No  results for input(s): COLORURINE, LABSPEC, PHURINE, GLUCOSEU, HGBUR, BILIRUBINUR, KETONESUR, PROTEINUR, UROBILINOGEN, NITRITE, LEUKOCYTESUR in the last 72 hours.  Invalid input(s): APPERANCEUR    Imaging: DG Chest 2 View  Result Date: 12/02/2020 CLINICAL DATA:  CHF. EXAM: CHEST - 2 VIEW COMPARISON:  Chest radiograph 11/29/2020. FINDINGS: Post median sternotomy and CABG. Cardiomegaly is not significantly changed. Persistent but improving pulmonary edema. Persistent but improving bilateral pleural effusions, right greater than left. Associated basilar opacities consistent with compressive atelectasis. There is fluid in the fissures. No pneumothorax or confluent airspace disease. Stable osseous structures. IMPRESSION: Improving CHF with persistent but improving pulmonary edema and bilateral pleural effusions. Cardiomegaly is not significantly changed. Electronically Signed   By: Keith Rake M.D.   On: 12/02/2020 10:06     Medications:    furosemide (LASIX) 200 mg in dextrose 5% 100 mL (2mg /mL) infusion 6 mg/hr (12/02/20 0350)    atorvastatin  40 mg Oral QHS   digoxin  0.0625 mg Oral Daily   ferrous sulfate  325 mg Oral Q M,W,F   hydrALAZINE  25 mg Oral BID  isosorbide mononitrate  30 mg Oral Daily   methylPREDNISolone (SOLU-MEDROL) injection  40 mg Intravenous Daily   metolazone  2.5 mg Oral Daily   metoprolol tartrate  12.5 mg Oral BID   midodrine  5 mg Oral TID WC   ranolazine  500 mg Oral BID   sodium chloride flush  3 mL Intravenous Q12H   sucralfate  1 g Oral BID   tamsulosin  0.4 mg Oral QPC supper   acetaminophen **OR** acetaminophen, ipratropium-albuterol, polyethylene glycol  Assessment/ Plan:  Mr. Andres Gutierrez is a 87 y.o.  male with medical problems of congestive heart failure, chronic kidney disease, atrial fibrillation, BPH, coronary disease status post CABG, cerebral amyloid angiopathy, GERD, hyperlipidemia, hypertension and myelodysplastic syndrome.  He was  originally admitted for increased dyspnea on exertion, worsening edema and weight gain.  Acute exacerbation of CHF (congestive heart failure) (HCC) [I50.9] Dyspnea, unspecified type [R06.00] Acute on chronic congestive heart failure, unspecified heart failure type ([place]) [I50.9] Acute on chronic combined systolic (congestive) and diastolic (congestive) heart failure (HCC) [I50.43]   CKD st 3B with subacute worseining -CKD risk factors include atherosclerosis, hypertension and advanced age - CT abd/pelvis in May shows scattered bilateral cyst, 2 mm nonobstructing stone in left kidney, no hydronephrosis.    2. Volume overload and acute on chronic systolic and diastolic CHF -2D ECHO 5/40/98-JXBJ 25-30%, severely decreased function with global hypokinesis, moderate LVH and grade 2 diastolic dysfunction.  -progressive shortness of breath requiring oxygen supplementation worse from baseline -IV Furosemide infusion of 6mg /hr  - Metolazone  - clinically improved today.  - recommend Torsemide 40mg  daily and Metolazone 2.5mg  MWF.       LOS: 5   7/22/202212:44 PM

## 2020-12-02 NOTE — Progress Notes (Signed)
Physical Therapy Treatment Patient Details Name: Andres Gutierrez MRN: [id number] DOB: 03/18/1931 Today's Date: 12/02/2020    History of Present Illness Andres Gutierrez is a 87 y.o. male with medical history significant of CHF, CKD 3B, A. fib, BPH, CAD status post CABG, cerebral amyloid angiopathy, GERD, hyperlipidemia, hypertension, iron deficiency anemia, myelodysplastic syndrome who presents with ongoing increased dyspnea on exertion, edema, weight gain. Pt is being treated for acute on chronic combined systolic & diastolic congestive heart failure.    PT Comments    Pt sitting EOB upon arrival stating, "I've already done all my arm exercises and messed with that incentive spirometer."  Pt agreed to mobility.  He remains ModI for bed mobility, Supervision with transfers.  Pt on 2L O2 96-97% at rest.  Pt completed 285f of continuous gait training with RW while on @ 2L O2.  Upon completion, pt c/o increased fatigue and being slightly "woozy".  O2 sats at 92%, HR low 60's.  Pt returned to 98% after 2 minute seated rest break. Pt left up in recliner with all needs met, call bell in reach.   Follow Up Recommendations  Supervision - Intermittent;Home health PT     Equipment Recommendations  None recommended by PT    Recommendations for Other Services       Precautions / Restrictions Precautions Precautions: Fall Precaution Comments: pt with LLE surgical shoe (per daughter podiatrist wants him to wear that shoe, he visits podiatrists every 2-4 weeks for tx of calluses)    Mobility  Bed Mobility Overal bed mobility: Modified Independent Bed Mobility: Supine to Sit     Supine to sit: Min guard Sit to supine: Min guard   General bed mobility comments: Pt was able to get himself to sitting EOB (and don wpost-op shoe) w/o direct assist, minimal cuing, some extra time    Transfers Overall transfer level: Modified independent Equipment used: Rolling walker (2 wheeled) Transfers: Sit to/from  Stand Sit to Stand: Supervision         General transfer comment: from low setting bed pt was able to rise to standing with heavy UE use but no direct assist or safety concerns  Ambulation/Gait Ambulation/Gait assistance: Supervision Gait Distance (Feet): 225 Feet Assistive device: Rolling walker (2 wheeled) Gait Pattern/deviations: Decreased step length - right;Decreased step length - left;Decreased stride length;Trunk flexed Gait velocity: decreased   General Gait Details: Pt remained on 2L O2 with sats dropping to 92% upon completion. Pt with increased fatigue and felt a little woozy with prolonged standing.  HR was 32bpm with pulse Ox, yet manualy it was 60bpm and irregular   Stairs             Wheelchair Mobility    Modified Rankin (Stroke Patients Only)       Balance Overall balance assessment: Needs assistance Sitting-balance support: Feet supported;Bilateral upper extremity supported Sitting balance-[name]: Good     Standing balance support: During functional activity;Bilateral upper extremity supported Standing balance-[name]: Fair Standing balance comment: requires UE support on RW                            Cognition Arousal/Alertness: Awake/alert Behavior During Therapy: WFL for tasks assessed/performed Overall Cognitive Status: Within Functional Limits for tasks assessed  General Comments: very pleasant and cooperative      Exercises      General Comments        Pertinent Vitals/Pain      Home Living                      Prior Function            PT Goals (current goals can now be found in the care plan section) Acute Rehab PT Goals Patient Stated Goal: get better, go to rehab Progress towards PT goals: Progressing toward goals    Frequency    Min 2X/week      PT Plan Discharge plan needs to be updated    Co-evaluation              AM-PAC  PT "6 Clicks" Mobility   Outcome Measure  Help needed turning from your back to your side while in a flat bed without using bedrails?: None Help needed moving from lying on your back to sitting on the side of a flat bed without using bedrails?: None Help needed moving to and from a bed to a chair (including a wheelchair)?: A Little Help needed standing up from a chair using your arms (e.g., wheelchair or bedside chair)?: None Help needed to walk in hospital room?: A Little Help needed climbing 3-5 steps with a railing? : A Little 6 Click Score: 21    End of Session Equipment Utilized During Treatment: Oxygen;Gait belt Activity Tolerance: Patient tolerated treatment well Patient left: in chair;with call bell/phone within reach;with family/visitor present;with nursing/sitter in room Nurse Communication: Mobility status PT Visit Diagnosis: Muscle weakness (generalized) (M62.81);Difficulty in walking, not elsewhere classified (R26.2)     Time: 4492-0100 PT Time Calculation (min) (ACUTE ONLY): 45 min  Charges:  $Gait Training: 23-37 mins $Therapeutic Activity: 8-22 mins                    Mikel Cella, PTA    Josie Dixon 12/02/2020, 5:27 PM

## 2020-12-02 NOTE — Care Management Important Message (Signed)
Important Message  Patient Details  Name: Andres Gutierrez MRN: [id number] Date of Birth: 01/19/1931   Medicare Important Message Given:  Yes     [name] 12/02/2020, 11:24 AM

## 2020-12-02 NOTE — Progress Notes (Signed)
PROGRESS NOTE    ASIR BINGLEY  ZOX:[id number] DOB: 02/04/1931 DOA: 11/26/2020 PCP: Rusty Aus, MD   Chief complaint.  Shortness of breath. Brief Narrative:   Andres Gutierrez is a 87 y.o. male with medical history significant of CHF, CKD 3B, A. fib, BPH, CAD status post CABG, cerebral amyloid angiopathy, GERD, hyperlipidemia, hypertension, iron deficiency anemia, myelodysplastic syndrome who presents with ongoing increased dyspnea on exertion, edema, weight gain. Echocardiogram performed in May 2022 showed ejection fraction 25 to 30%.  He is started on IV Lasix 7/19.  Patient had a worsening short of breath already this morning, was placed on oxygen for hypoxemia. 7/20 BNP ELEVATED. Pt reports no sob while resting in bed. On lasix gtt.  7/21 still sob. Daughter at bedside. Spoke to pt about his code status while daughter was at bedside, after discussion, he agreed to DNR  status.  7/22- feels breathing is a little better.   Assessment & Plan:   Principal Problem:   Acute exacerbation of CHF (congestive heart failure) (HCC) Active Problems:   CAD (coronary artery disease)   HTN (hypertension)   Chronic atrial fibrillation (HCC)   Dyspnea on exertion   BPH (benign prostatic hyperplasia)   Gastroesophageal reflux disease with esophagitis   Thrombocytopenia (HCC)   HLD (hyperlipidemia)   MDS (myelodysplastic syndrome), low grade (HCC)   Hyperkalemia   Acute renal failure superimposed on stage 3b chronic kidney disease (HCC)   Acute on chronic combined systolic (congestive) and diastolic (congestive) heart failure ([place])   Acute hypoxemic respiratory failure ([place])  #1. Acute on chronic combined systolic and diastolic congestive heart failure. Elevated troponin secondary to congestive heart failure. Acute hypoxemic respiratory failure. Patient had a worsening short of breath earlier this morning, consistent with congestive heart failure exacerbation.  He also requiring oxygen. He had  a slightly worsening renal function 2 days ago, Lasix was reduced to oral torsemide  7/20- was restarted on lasix gtt. Bnp elevated. Will d/w cards about ?  7/21 7/21 patient on 2 L nasal cannula.   7/22-clinically much better today.  Due to renal function /hyponatremia, will switch lasix gtt torsemide 40mg  bid and metolazone 2.5 MWF. Palliative care input was appreciated, plz see note I/o Daily weight D/c iv steroids    2.  Acute kidney injury on chronic kidney disease stage IIIb. Hyperkalemia:  Creatinine mildly elevated. Switch lasix gtt to torsemide as above   3. Hyponatremia Due to lasix gtt. Will switch as above Monitor am labs  3.  Coronary disease with status post CABG. Continue cardiac meds  4.  Anemia. Thrombocytopenia. Myelodysplastic syndrome. H&H stable.  Platelets stable.  Monitor periodically   DVT prophylaxis: SCDs Code Status: DNR.   Family Communication: None at bedside Disposition Plan:    Status is: Inpatient  Remains inpatient appropriate because:IV treatments appropriate due to intensity of illness or inability to take PO and Inpatient level of care appropriate due to severity of illness  Dispo: The patient is from: Home              Anticipated d/c is to: SNF              Patient currently is not medically stable to d/c.   Difficult to place patient No        I/O last 3 completed shifts: In: 652.4 [P.O.:540; I.V.:112.4] Out: 1250 [Urine:1250] Total I/O In: 240 [P.O.:240] Out: 125 [Urine:125]     Consultants:  Cardiology and nephrology  Procedures:  none  Antimicrobials: none   Subjective: Shortness of breath improving.  No chest pain.  No dizziness.   Objective: Vitals:   12/02/20 0604 12/02/20 0749 12/02/20 1047 12/02/20 1151  BP:  (!) 110/54  (!) 96/57  Pulse:  78 78 63  Resp:  17  17  Temp:  98.6 F (37 C)  97.6 F (36.4 C)  TempSrc:  Oral  Oral  SpO2:  94%  98%  Weight: 78.6 kg     Height:         Intake/Output Summary (Last 24 hours) at 12/02/2020 1332 Last data filed at 12/02/2020 1254 Gross per 24 hour  Intake 712.36 ml  Output 825 ml  Net -112.64 ml   Filed Weights   11/30/20 0354 12/01/20 0854 12/02/20 0604  Weight: 80.9 kg 81.1 kg 78.6 kg    Examination: NAD, calm Decreased breath sounds at bases no wheezing or rails Regular S1-S2 no gallops soft benign positive bowel sounds No edema AxOx4   Data Reviewed: I have personally reviewed following labs and imaging studies  CBC: Recent Labs  Lab 11/26/20 1645 11/27/20 0436 11/28/20 0412 11/30/20 0538  WBC 6.6 5.8 5.5 8.3  NEUTROABS  --   --  1.8 3.7  HGB 9.0* 8.7* 8.6* 8.7*  HCT 27.9* 25.9* 26.0* 26.2*  MCV 101.8* 103.6* 100.8* 97.8  PLT 110* 71* 88* 82*   Basic Metabolic Panel: Recent Labs  Lab 11/26/20 1536 11/27/20 0436 11/28/20 0412 11/29/20 0556 11/30/20 0538 12/01/20 0837 12/02/20 0444  NA 136   < > 135 133* 132* 132* 130*  K 5.5*   < > 4.1 3.9 4.1 4.1 4.6  CL 107   < > 104 98 96* 94* 97*  CO2 21*   < > 24 24 23 24 22   GLUCOSE 101*   < > 83 87 93 116* 149*  BUN 45*   < > 46* 45* 48* 50* 59*  CREATININE 2.02*   < > 2.08* 1.96* 2.01* 2.04* 2.26*  CALCIUM 9.3   < > 8.9 8.5* 9.2 9.0 8.8*  MG 1.8  --  1.5* 1.8 1.8  --   --    < > = values in this interval not displayed.   GFR: Estimated Creatinine Clearance: 25.6 mL/min (A) (by C-G formula based on SCr of 2.26 mg/dL (H)). Liver Function Tests: Recent Labs  Lab 11/26/20 1536  AST 35  ALT 9  ALKPHOS 54  BILITOT 1.6*  PROT 7.6  ALBUMIN 4.3   No results for input(s): LIPASE, AMYLASE in the last 168 hours. No results for input(s): AMMONIA in the last 168 hours. Coagulation Profile: No results for input(s): INR, PROTIME in the last 168 hours. Cardiac Enzymes: No results for input(s): CKTOTAL, CKMB, CKMBINDEX, TROPONINI in the last 168 hours. BNP (last 3 results) No results for input(s): PROBNP in the last 8760 hours. HbA1C: No  results for input(s): HGBA1C in the last 72 hours. CBG: No results for input(s): GLUCAP in the last 168 hours.  Lipid Profile: No results for input(s): CHOL, HDL, LDLCALC, TRIG, CHOLHDL, LDLDIRECT in the last 72 hours. Thyroid Function Tests: No results for input(s): TSH, T4TOTAL, FREET4, T3FREE, THYROIDAB in the last 72 hours. Anemia Panel: No results for input(s): VITAMINB12, FOLATE, FERRITIN, TIBC, IRON, RETICCTPCT in the last 72 hours.  Sepsis Labs: No results for input(s): PROCALCITON, LATICACIDVEN in the last 168 hours.  Recent Results (from the past 240 hour(s))  Resp Panel by RT-PCR (Flu A&B, Covid) Nasopharyngeal Swab  Status: None   Collection Time: 11/26/20  4:13 PM   Specimen: Nasopharyngeal Swab; Nasopharyngeal(NP) swabs in vial transport medium  Result Value Ref Range Status   SARS Coronavirus 2 by RT PCR NEGATIVE NEGATIVE Final    Comment: (NOTE) SARS-CoV-2 target nucleic acids are NOT DETECTED.  The SARS-CoV-2 RNA is generally detectable in upper respiratory specimens during the acute phase of infection. The lowest concentration of SARS-CoV-2 viral copies this assay can detect is 138 copies/mL. A negative result does not preclude SARS-Cov-2 infection and should not be used as the sole basis for treatment or other patient management decisions. A negative result may occur with  improper specimen collection/handling, submission of specimen other than nasopharyngeal swab, presence of viral mutation(s) within the areas targeted by this assay, and inadequate number of viral copies(<138 copies/mL). A negative result must be combined with clinical observations, patient history, and epidemiological information. The expected result is Negative.  Fact Sheet for Patients:  EntrepreneurPulse.com.au  Fact Sheet for Healthcare Providers:  IncredibleEmployment.be  This test is no t yet approved or cleared by the [place] FDA and   has been authorized for detection and/or diagnosis of SARS-CoV-2 by FDA under an Emergency Use Authorization (EUA). This EUA will remain  in effect (meaning this test can be used) for the duration of the COVID-19 declaration under Section 564(b)(1) of the Act, 21 U.S.C.section 360bbb-3(b)(1), unless the authorization is terminated  or revoked sooner.       Influenza A by PCR NEGATIVE NEGATIVE Final   Influenza B by PCR NEGATIVE NEGATIVE Final    Comment: (NOTE) The Xpert Xpress SARS-CoV-2/FLU/RSV plus assay is intended as an aid in the diagnosis of influenza from Nasopharyngeal swab specimens and should not be used as a sole basis for treatment. Nasal washings and aspirates are unacceptable for Xpert Xpress SARS-CoV-2/FLU/RSV testing.  Fact Sheet for Patients: EntrepreneurPulse.com.au  Fact Sheet for Healthcare Providers: IncredibleEmployment.be  This test is not yet approved or cleared by the [place] FDA and has been authorized for detection and/or diagnosis of SARS-CoV-2 by FDA under an Emergency Use Authorization (EUA). This EUA will remain in effect (meaning this test can be used) for the duration of the COVID-19 declaration under Section 564(b)(1) of the Act, 21 U.S.C. section 360bbb-3(b)(1), unless the authorization is terminated or revoked.  Performed at [facility], [place], [address], [place], [place] [address]          Radiology Studies: DG Chest 2 View  Result Date: 12/02/2020 CLINICAL DATA:  CHF. EXAM: CHEST - 2 VIEW COMPARISON:  Chest radiograph 11/29/2020. FINDINGS: Post median sternotomy and CABG. Cardiomegaly is not significantly changed. Persistent but improving pulmonary edema. Persistent but improving bilateral pleural effusions, right greater than left. Associated basilar opacities consistent with compressive atelectasis. There is fluid in the fissures. No pneumothorax or confluent airspace disease.  Stable osseous structures. IMPRESSION: Improving CHF with persistent but improving pulmonary edema and bilateral pleural effusions. Cardiomegaly is not significantly changed. Electronically Signed   By: Keith Rake M.D.   On: 12/02/2020 10:06        Scheduled Meds:  atorvastatin  40 mg Oral QHS   digoxin  0.0625 mg Oral Daily   ferrous sulfate  325 mg Oral Q M,W,F   hydrALAZINE  25 mg Oral BID   isosorbide mononitrate  30 mg Oral Daily   [START ON 12/05/2020] metolazone  2.5 mg Oral Q M,W,F   metoprolol tartrate  12.5 mg Oral BID   midodrine  5 mg Oral TID WC   ranolazine  500 mg Oral BID   sodium chloride flush  3 mL Intravenous Q12H   sucralfate  1 g Oral BID   tamsulosin  0.4 mg Oral QPC supper   torsemide  40 mg Oral BID   Continuous Infusions:     LOS: 5 days    Time spent: 35 minutes, more than 50% time spent on direct patient care.    Nolberto Hanlon, MD [facility]   To contact the attending provider between 7A-7P or the covering provider during after hours 7P-7A, please log into the web site [email or web address] and access using universal [place] password for that web site. If you do not have the password, please call the hospital operator.  12/02/2020, 1:32 PM

## 2020-12-02 NOTE — Plan of Care (Signed)
Lasix drip continues at 6mg /hr.  UOP 257ml this shift.  Pt is observing 1841ml fluid restriction without issue.  Pt remains dyspneic with any exertion.  BP low 982'U systolic.   Ayesha Mohair BSN RN CMSRN  Problem: Activity: Goal: Capacity to carry out activities will improve Outcome: Not Progressing   Problem: Elimination: Goal: Will not experience complications related to urinary retention Outcome: Not Progressing

## 2020-12-03 LAB — BASIC METABOLIC PANEL
Anion gap: 8 (ref 5–15)
BUN: 73 mg/dL — ABNORMAL HIGH (ref 8–23)
CO2: 23 mmol/L (ref 22–32)
Calcium: 8.3 mg/dL — ABNORMAL LOW (ref 8.9–10.3)
Chloride: 96 mmol/L — ABNORMAL LOW (ref 98–111)
Creatinine, Ser: 2.74 mg/dL — ABNORMAL HIGH (ref 0.61–1.24)
GFR, Estimated: 22 mL/min — ABNORMAL LOW (ref >60)
Glucose, Bld: 132 mg/dL — ABNORMAL HIGH (ref 70–99)
Potassium: 4.8 mmol/L (ref 3.5–5.1)
Sodium: 127 mmol/L — ABNORMAL LOW (ref 135–145)

## 2020-12-03 LAB — CBC
HCT: 23.7 % — ABNORMAL LOW (ref 39.0–52.0)
Hemoglobin: 7.8 g/dL — ABNORMAL LOW (ref 13.0–17.0)
MCH: 33.3 pg (ref 26.0–34.0)
MCHC: 32.9 g/dL (ref 30.0–36.0)
MCV: 101.3 fL — ABNORMAL HIGH (ref 80.0–100.0)
Platelets: 103 10*3/uL — ABNORMAL LOW (ref 150–400)
RBC: 2.34 MIL/uL — ABNORMAL LOW (ref 4.22–5.81)
RDW: 19.5 % — ABNORMAL HIGH (ref 11.5–15.5)
WBC: 6.3 10*3/uL (ref 4.0–10.5)
nRBC: 0.8 % — ABNORMAL HIGH (ref 0.0–0.2)

## 2020-12-03 MED ORDER — TORSEMIDE 20 MG PO TABS
20.0000 mg | ORAL_TABLET | Freq: Every day | ORAL | Status: DC
  Administered 2020-12-04 – 2020-12-07 (×4): 20 mg via ORAL
  Filled 2020-12-03 (×4): qty 1

## 2020-12-03 NOTE — Progress Notes (Signed)
[facility]  PROGRESS NOTE   Subjective:   Patient seen at bedside.  Patient's daughter is in attendance. Feels much better.  She has been on torsemide and metolazone. Denies any chest pain, shortness of breath or orthopnea.  Objective:  Vital signs in last 24 hours:  Temp:  [97.3 F (36.3 C)-98.8 F (37.1 C)] 97.7 F (36.5 C) (07/23 1108) Pulse Rate:  [59-79] 68 (07/23 1108) Resp:  [16-20] 18 (07/23 1108) BP: (99-123)/(53-69) 107/53 (07/23 1108) SpO2:  [94 %-100 %] 99 % (07/23 1108) Weight:  [78.2 kg] 78.2 kg (07/23 0246)  Weight change: -2.945 kg Filed Weights   12/01/20 0854 12/02/20 0604 12/03/20 0246  Weight: 81.1 kg 78.6 kg 78.2 kg    Intake/Output: I/O last 3 completed shifts: In: 664.7 [P.O.:600; I.V.:64.7] Out: 955 [Urine:955]   Intake/Output this shift:  Total I/O In: -  Out: 175 [Urine:175]  Physical Exam: General:  No acute distress  Head:  Normocephalic, atraumatic. Moist oral mucosal membranes  Eyes:  Anicteric  Neck:  Supple  Lungs:   Clear to auscultation, normal effort  Heart:  S1S2 no rubs  Abdomen:   Soft, nontender, bowel sounds present  Extremities:  peripheral edema.  Neurologic:  Awake, alert, following commands  Skin:  No lesions  Access:     Basic Metabolic Panel: Recent Labs  Lab 11/26/20 1536 11/27/20 0436 11/28/20 0412 11/29/20 0556 11/30/20 0538 12/01/20 0837 12/02/20 0444 12/03/20 0403  NA 136   < > 135 133* 132* 132* 130* 127*  K 5.5*   < > 4.1 3.9 4.1 4.1 4.6 4.8  CL 107   < > 104 98 96* 94* 97* 96*  CO2 21*   < > 24 24 23 24 22 23   GLUCOSE 101*   < > 83 87 93 116* 149* 132*  BUN 45*   < > 46* 45* 48* 50* 59* 73*  CREATININE 2.02*   < > 2.08* 1.96* 2.01* 2.04* 2.26* 2.74*  CALCIUM 9.3   < > 8.9 8.5* 9.2 9.0 8.8* 8.3*  MG 1.8  --  1.5* 1.8 1.8  --   --   --    < > = values in this interval not displayed.    CBC: Recent Labs  Lab 11/26/20 1645 11/27/20 0436 11/28/20 0412 11/30/20 0538  12/03/20 0403  WBC 6.6 5.8 5.5 8.3 6.3  NEUTROABS  --   --  1.8 3.7  --   HGB 9.0* 8.7* 8.6* 8.7* 7.8*  HCT 27.9* 25.9* 26.0* 26.2* 23.7*  MCV 101.8* 103.6* 100.8* 97.8 101.3*  PLT 110* 71* 88* 82* 103*     Urinalysis: No results for input(s): COLORURINE, LABSPEC, PHURINE, GLUCOSEU, HGBUR, BILIRUBINUR, KETONESUR, PROTEINUR, UROBILINOGEN, NITRITE, LEUKOCYTESUR in the last 72 hours.  Invalid input(s): APPERANCEUR    Imaging: DG Chest 2 View  Result Date: 12/02/2020 CLINICAL DATA:  CHF. EXAM: CHEST - 2 VIEW COMPARISON:  Chest radiograph 11/29/2020. FINDINGS: Post median sternotomy and CABG. Cardiomegaly is not significantly changed. Persistent but improving pulmonary edema. Persistent but improving bilateral pleural effusions, right greater than left. Associated basilar opacities consistent with compressive atelectasis. There is fluid in the fissures. No pneumothorax or confluent airspace disease. Stable osseous structures. IMPRESSION: Improving CHF with persistent but improving pulmonary edema and bilateral pleural effusions. Cardiomegaly is not significantly changed. Electronically Signed   By: Keith Rake M.D.   On: 12/02/2020 10:06     Medications:     atorvastatin  40 mg Oral QHS  digoxin  0.0625 mg Oral Daily   ferrous sulfate  325 mg Oral Q M,W,F   hydrALAZINE  25 mg Oral BID   isosorbide mononitrate  30 mg Oral Daily   [START ON 12/05/2020] metolazone  2.5 mg Oral Q M,W,F   metoprolol tartrate  12.5 mg Oral BID   midodrine  10 mg Oral TID WC   ranolazine  500 mg Oral BID   sodium chloride flush  3 mL Intravenous Q12H   sucralfate  1 g Oral BID   tamsulosin  0.4 mg Oral QPC supper   [START ON 12/04/2020] torsemide  20 mg Oral Daily    Assessment/ Plan:     Principal Problem:   Acute exacerbation of CHF (congestive heart failure) (HCC) Active Problems:   CAD (coronary artery disease)   HTN (hypertension)   Chronic atrial fibrillation (HCC)   Dyspnea on  exertion   BPH (benign prostatic hyperplasia)   Gastroesophageal reflux disease with esophagitis   Thrombocytopenia (HCC)   HLD (hyperlipidemia)   MDS (myelodysplastic syndrome), low grade (HCC)   Hyperkalemia   Acute renal failure superimposed on stage 3b chronic kidney disease (HCC)   Acute on chronic combined systolic (congestive) and diastolic (congestive) heart failure ([place])   Acute hypoxemic respiratory failure ([place])  #1: Acute kidney injury on CKD: Possibly due to overdiuresis.  CKD, stage IV is most likely due to chronic ischemic nephropathy.  CT scan did not show any evidence of obstructive uropathy.  #2: Hyponatremia: Possibly secondary to diuretic effect.  Advised him to stay on fluid restriction of 35 to 40 ounces daily.  #3: Congestive heart failure: We will reduce to torsemide 20 mg daily with 1 L fluid restriction  Spoke to the patient and his daughter at bedside last admission importance of outpatient follow-up.    LOS: [place], [facility] 7/23/20222:33 PM

## 2020-12-03 NOTE — Progress Notes (Signed)
PROGRESS NOTE    Andres Gutierrez  RSW:[id number] DOB: 27-Apr-1931 DOA: 11/26/2020 PCP: Rusty Aus, MD   Chief complaint.  Shortness of breath. Brief Narrative:   Andres Gutierrez is a 87 y.o. male with medical history significant of CHF, CKD 3B, A. fib, BPH, CAD status post CABG, cerebral amyloid angiopathy, GERD, hyperlipidemia, hypertension, iron deficiency anemia, myelodysplastic syndrome who presents with ongoing increased dyspnea on exertion, edema, weight gain. Echocardiogram performed in May 2022 showed ejection fraction 25 to 30%.  He is started on IV Lasix 7/19.  Patient had a worsening short of breath already this morning, was placed on oxygen for hypoxemia. 7/20 BNP ELEVATED. Pt reports no sob while resting in bed. On lasix gtt.  7/21 still sob. Daughter at bedside. Spoke to pt about his code status while daughter was at bedside, after discussion, he agreed to DNR  status.  7/22- feels breathing is a little better.  7/23 had 200 cc UO today per nsg.  Denies sob /dizziness  Assessment & Plan:   Principal Problem:   Acute exacerbation of CHF (congestive heart failure) (HCC) Active Problems:   CAD (coronary artery disease)   HTN (hypertension)   Chronic atrial fibrillation (HCC)   Dyspnea on exertion   BPH (benign prostatic hyperplasia)   Gastroesophageal reflux disease with esophagitis   Thrombocytopenia (HCC)   HLD (hyperlipidemia)   MDS (myelodysplastic syndrome), low grade (HCC)   Hyperkalemia   Acute renal failure superimposed on stage 3b chronic kidney disease (HCC)   Acute on chronic combined systolic (congestive) and diastolic (congestive) heart failure ([place])   Acute hypoxemic respiratory failure ([place])  #1. Acute on chronic combined systolic and diastolic congestive heart failure. Elevated troponin secondary to congestive heart failure. Acute hypoxemic respiratory failure. Patient had a worsening short of breath earlier this morning, consistent with congestive heart  failure exacerbation.  He also requiring oxygen. He had a slightly worsening renal function 2 days ago, Lasix was reduced to oral torsemide  7/20- was restarted on lasix gtt. Bnp elevated. Will d/w cards about ?  7/21 7/21 patient on 2 L nasal cannula.   7/23 bp on low side, asx. UO picked up today Spoke to nephrology, rec. Decreasing torsemide to 20mg  qd starting tomorrow.  Continue metolazone 2/5 MWF. I/os Palliative care following    2.  Acute kidney injury on chronic kidney disease stage IIIb. Hyperkalemia: Creatinine increased Decrease torsemide to 20mg  qd UO better today Nephrology following     3. Hyponatremia Is down today Possibly due to diuretics?  Monitor   4.  Coronary disease with status post CABG. Continue cardiac meds  4.  Anemia. Thrombocytopenia. Myelodysplastic syndrome. H&H stable.  Platelets stable.  Monitor periodically   DVT prophylaxis: SCDs Code Status: DNR.   Family Communication: daughter updated via phone Disposition Plan:    Status is: Inpatient  Remains inpatient appropriate because:IV treatments appropriate due to intensity of illness or inability to take PO and Inpatient level of care appropriate due to severity of illness  Dispo: The patient is from: Home              Anticipated d/c is to: SNF              Patient currently is not medically stable to d/c.   Difficult to place patient No        I/O last 3 completed shifts: In: 664.7 [P.O.:600; I.V.:64.7] Out: 955 [Urine:955] No intake/output data recorded.     Consultants:  Cardiology and nephrology  Procedures: none  Antimicrobials: none   Subjective: No sob or cp.    Objective: Vitals:   12/02/20 2213 12/03/20 0246 12/03/20 0356 12/03/20 0814  BP: (!) 110/56 (!) 99/55 (!) 105/59 (!) 100/57  Pulse: 79 (!) 59 62 66  Resp: 20 18 20 18   Temp: 97.7 F (36.5 C) 98.8 F (37.1 C)  (!) 97.3 F (36.3 C)  TempSrc:      SpO2: 98% 95% 94% 95%  Weight:  78.2 kg     Height:        Intake/Output Summary (Last 24 hours) at 12/03/2020 1054 Last data filed at 12/03/2020 0700 Gross per 24 hour  Intake 518.15 ml  Output 555 ml  Net -36.85 ml   Filed Weights   12/01/20 0854 12/02/20 0604 12/03/20 0246  Weight: 81.1 kg 78.6 kg 78.2 kg    Examination: Nad, calm Decrease bs at bases no w Regular s1/s2 no gallop Soft benign +bs No edema aaxoxo4   Data Reviewed: I have personally reviewed following labs and imaging studies  CBC: Recent Labs  Lab 11/26/20 1645 11/27/20 0436 11/28/20 0412 11/30/20 0538 12/03/20 0403  WBC 6.6 5.8 5.5 8.3 6.3  NEUTROABS  --   --  1.8 3.7  --   HGB 9.0* 8.7* 8.6* 8.7* 7.8*  HCT 27.9* 25.9* 26.0* 26.2* 23.7*  MCV 101.8* 103.6* 100.8* 97.8 101.3*  PLT 110* 71* 88* 82* 937*   Basic Metabolic Panel: Recent Labs  Lab 11/26/20 1536 11/27/20 0436 11/28/20 0412 11/29/20 0556 11/30/20 0538 12/01/20 0837 12/02/20 0444 12/03/20 0403  NA 136   < > 135 133* 132* 132* 130* 127*  K 5.5*   < > 4.1 3.9 4.1 4.1 4.6 4.8  CL 107   < > 104 98 96* 94* 97* 96*  CO2 21*   < > 24 24 23 24 22 23   GLUCOSE 101*   < > 83 87 93 116* 149* 132*  BUN 45*   < > 46* 45* 48* 50* 59* 73*  CREATININE 2.02*   < > 2.08* 1.96* 2.01* 2.04* 2.26* 2.74*  CALCIUM 9.3   < > 8.9 8.5* 9.2 9.0 8.8* 8.3*  MG 1.8  --  1.5* 1.8 1.8  --   --   --    < > = values in this interval not displayed.   GFR: Estimated Creatinine Clearance: 21 mL/min (A) (by C-G formula based on SCr of 2.74 mg/dL (H)). Liver Function Tests: Recent Labs  Lab 11/26/20 1536  AST 35  ALT 9  ALKPHOS 54  BILITOT 1.6*  PROT 7.6  ALBUMIN 4.3   No results for input(s): LIPASE, AMYLASE in the last 168 hours. No results for input(s): AMMONIA in the last 168 hours. Coagulation Profile: No results for input(s): INR, PROTIME in the last 168 hours. Cardiac Enzymes: No results for input(s): CKTOTAL, CKMB, CKMBINDEX, TROPONINI in the last 168 hours. BNP (last 3  results) No results for input(s): PROBNP in the last 8760 hours. HbA1C: No results for input(s): HGBA1C in the last 72 hours. CBG: No results for input(s): GLUCAP in the last 168 hours.  Lipid Profile: No results for input(s): CHOL, HDL, LDLCALC, TRIG, CHOLHDL, LDLDIRECT in the last 72 hours. Thyroid Function Tests: No results for input(s): TSH, T4TOTAL, FREET4, T3FREE, THYROIDAB in the last 72 hours. Anemia Panel: No results for input(s): VITAMINB12, FOLATE, FERRITIN, TIBC, IRON, RETICCTPCT in the last 72 hours.  Sepsis Labs: No results for input(s): PROCALCITON,  LATICACIDVEN in the last 168 hours.  Recent Results (from the past 240 hour(s))  Resp Panel by RT-PCR (Flu A&B, Covid) Nasopharyngeal Swab     Status: None   Collection Time: 11/26/20  4:13 PM   Specimen: Nasopharyngeal Swab; Nasopharyngeal(NP) swabs in vial transport medium  Result Value Ref Range Status   SARS Coronavirus 2 by RT PCR NEGATIVE NEGATIVE Final    Comment: (NOTE) SARS-CoV-2 target nucleic acids are NOT DETECTED.  The SARS-CoV-2 RNA is generally detectable in upper respiratory specimens during the acute phase of infection. The lowest concentration of SARS-CoV-2 viral copies this assay can detect is 138 copies/mL. A negative result does not preclude SARS-Cov-2 infection and should not be used as the sole basis for treatment or other patient management decisions. A negative result may occur with  improper specimen collection/handling, submission of specimen other than nasopharyngeal swab, presence of viral mutation(s) within the areas targeted by this assay, and inadequate number of viral copies(<138 copies/mL). A negative result must be combined with clinical observations, patient history, and epidemiological information. The expected result is Negative.  Fact Sheet for Patients:  EntrepreneurPulse.com.au  Fact Sheet for Healthcare Providers:   IncredibleEmployment.be  This test is no t yet approved or cleared by the [place] FDA and  has been authorized for detection and/or diagnosis of SARS-CoV-2 by FDA under an Emergency Use Authorization (EUA). This EUA will remain  in effect (meaning this test can be used) for the duration of the COVID-19 declaration under Section 564(b)(1) of the Act, 21 U.S.C.section 360bbb-3(b)(1), unless the authorization is terminated  or revoked sooner.       Influenza A by PCR NEGATIVE NEGATIVE Final   Influenza B by PCR NEGATIVE NEGATIVE Final    Comment: (NOTE) The Xpert Xpress SARS-CoV-2/FLU/RSV plus assay is intended as an aid in the diagnosis of influenza from Nasopharyngeal swab specimens and should not be used as a sole basis for treatment. Nasal washings and aspirates are unacceptable for Xpert Xpress SARS-CoV-2/FLU/RSV testing.  Fact Sheet for Patients: EntrepreneurPulse.com.au  Fact Sheet for Healthcare Providers: IncredibleEmployment.be  This test is not yet approved or cleared by the [place] FDA and has been authorized for detection and/or diagnosis of SARS-CoV-2 by FDA under an Emergency Use Authorization (EUA). This EUA will remain in effect (meaning this test can be used) for the duration of the COVID-19 declaration under Section 564(b)(1) of the Act, 21 U.S.C. section 360bbb-3(b)(1), unless the authorization is terminated or revoked.  Performed at [facility], [address] [place], [place] [address]          Radiology Studies: DG Chest 2 View  Result Date: 12/02/2020 CLINICAL DATA:  CHF. EXAM: CHEST - 2 VIEW COMPARISON:  Chest radiograph 11/29/2020. FINDINGS: Post median sternotomy and CABG. Cardiomegaly is not significantly changed. Persistent but improving pulmonary edema. Persistent but improving bilateral pleural effusions, right greater than left. Associated basilar opacities  consistent with compressive atelectasis. There is fluid in the fissures. No pneumothorax or confluent airspace disease. Stable osseous structures. IMPRESSION: Improving CHF with persistent but improving pulmonary edema and bilateral pleural effusions. Cardiomegaly is not significantly changed. Electronically Signed   By: Keith Rake M.D.   On: 12/02/2020 10:06        Scheduled Meds:  atorvastatin  40 mg Oral QHS   digoxin  0.0625 mg Oral Daily   ferrous sulfate  325 mg Oral Q M,W,F   hydrALAZINE  25 mg Oral BID   isosorbide mononitrate  30  mg Oral Daily   [START ON 12/05/2020] metolazone  2.5 mg Oral Q M,W,F   metoprolol tartrate  12.5 mg Oral BID   midodrine  10 mg Oral TID WC   ranolazine  500 mg Oral BID   sodium chloride flush  3 mL Intravenous Q12H   sucralfate  1 g Oral BID   tamsulosin  0.4 mg Oral QPC supper   torsemide  40 mg Oral Daily   Continuous Infusions:     LOS: 6 days    Time spent: 35 minutes, more than 50% time spent on direct patient care.    Nolberto Hanlon, MD [facility]   To contact the attending provider between 7A-7P or the covering provider during after hours 7P-7A, please log into the web site [email or web address] and access using universal [place] password for that web site. If you do not have the password, please call the hospital operator.  12/03/2020, 10:54 AM

## 2020-12-03 NOTE — TOC Progression Note (Signed)
Transition of Care [facility]) - Progression Note    Patient Details  Name: Andres Gutierrez MRN: [id number] Date of Birth: 04-29-1931  Transition of Care [facility]) CM/SW [place], [place] Phone Number: 12/03/2020, 10:56 AM  Clinical Narrative:      CSW spoke with patient's daughter Andres Gutierrez regarding discharge planning, updated on PT recs of Home Health. Andres Gutierrez inquired as to SNF, CSW informed patient does not qualify for insurance purposes yet they can private pay. Andres Gutierrez declines private pay, reports being agreeable to home health services. Reports having [facility] in the past, agreeable to have this agency again. No other questions or concerns at this time.   Corene Cornea with [facility] informed of choice.     Expected Discharge Plan: Skilled Nursing Facility Barriers to Discharge: Continued Medical Work up  Expected Discharge Plan and Services Expected Discharge Plan: [place] Choice: [place] arrangements for the past 2 months: Single Family Home                                       Social Determinants of Health (SDOH) Interventions    Readmission Risk Interventions Readmission Risk Prevention Plan 07/30/2019 06/08/2019  Transportation Screening Complete Complete  PCP or Specialist Appt within 3-5 Days Complete Complete  HRI or Home Care Consult Complete Complete  Social Work Consult for [place] Planning/Counseling - Complete  Palliative Care Screening - Not Applicable  Medication Review Press photographer) Complete Complete  Some recent data might be hidden

## 2020-12-03 NOTE — Progress Notes (Signed)
[facility] Cardiology  SUBJECTIVE: Patient sitting up in bed, with less shortness of breath   Vitals:   12/02/20 2213 12/03/20 0246 12/03/20 0356 12/03/20 0814  BP: (!) 110/56 (!) 99/55 (!) 105/59 (!) 100/57  Pulse: 79 (!) 59 62 66  Resp: 20 18 20 18   Temp: 97.7 F (36.5 C) 98.8 F (37.1 C)  (!) 97.3 F (36.3 C)  TempSrc:      SpO2: 98% 95% 94% 95%  Weight:  78.2 kg    Height:         Intake/Output Summary (Last 24 hours) at 12/03/2020 1057 Last data filed at 12/03/2020 0700 Gross per 24 hour  Intake 518.15 ml  Output 555 ml  Net -36.85 ml      PHYSICAL EXAM  General: Well developed, well nourished, in no acute distress HEENT:  Normocephalic and atramatic Neck:  No JVD.  Lungs: Clear bilaterally to auscultation and percussion. Heart: HRRR . Normal S1 and S2 without gallops or murmurs.  Abdomen: Bowel sounds are positive, abdomen soft and non-tender  Msk:  Back normal, normal gait. Normal strength and tone for age. Extremities: No clubbing, cyanosis or edema.   Neuro: Alert and oriented X 3. Psych:  Good affect, responds appropriately   LABS: Basic Metabolic Panel: Recent Labs    12/02/20 0444 12/03/20 0403  NA 130* 127*  K 4.6 4.8  CL 97* 96*  CO2 22 23  GLUCOSE 149* 132*  BUN 59* 73*  CREATININE 2.26* 2.74*  CALCIUM 8.8* 8.3*   Liver Function Tests: No results for input(s): AST, ALT, ALKPHOS, BILITOT, PROT, ALBUMIN in the last 72 hours. No results for input(s): LIPASE, AMYLASE in the last 72 hours. CBC: Recent Labs    12/03/20 0403  WBC 6.3  HGB 7.8*  HCT 23.7*  MCV 101.3*  PLT 103*   Cardiac Enzymes: No results for input(s): CKTOTAL, CKMB, CKMBINDEX, TROPONINI in the last 72 hours. BNP: Invalid input(s): POCBNP D-Dimer: No results for input(s): DDIMER in the last 72 hours. Hemoglobin A1C: No results for input(s): HGBA1C in the last 72 hours. Fasting Lipid Panel: No results for input(s): CHOL, HDL, LDLCALC, TRIG, CHOLHDL, LDLDIRECT in the last  72 hours. Thyroid Function Tests: No results for input(s): TSH, T4TOTAL, T3FREE, THYROIDAB in the last 72 hours.  Invalid input(s): FREET3 Anemia Panel: No results for input(s): VITAMINB12, FOLATE, FERRITIN, TIBC, IRON, RETICCTPCT in the last 72 hours.  DG Chest 2 View  Result Date: 12/02/2020 CLINICAL DATA:  CHF. EXAM: CHEST - 2 VIEW COMPARISON:  Chest radiograph 11/29/2020. FINDINGS: Post median sternotomy and CABG. Cardiomegaly is not significantly changed. Persistent but improving pulmonary edema. Persistent but improving bilateral pleural effusions, right greater than left. Associated basilar opacities consistent with compressive atelectasis. There is fluid in the fissures. No pneumothorax or confluent airspace disease. Stable osseous structures. IMPRESSION: Improving CHF with persistent but improving pulmonary edema and bilateral pleural effusions. Cardiomegaly is not significantly changed. Electronically Signed   By: Keith Rake M.D.   On: 12/02/2020 10:06     Echo LVEF 25 to 30% by 2D echocardiogram 10/04/2020  TELEMETRY: Sinus rhythm 70 bpm:  ASSESSMENT AND PLAN:  Principal Problem:   Acute exacerbation of CHF (congestive heart failure) (HCC) Active Problems:   CAD (coronary artery disease)   HTN (hypertension)   Chronic atrial fibrillation (HCC)   Dyspnea on exertion   BPH (benign prostatic hyperplasia)   Gastroesophageal reflux disease with esophagitis   Thrombocytopenia (HCC)   HLD (hyperlipidemia)   MDS (  myelodysplastic syndrome), low grade (HCC)   Hyperkalemia   Acute renal failure superimposed on stage 3b chronic kidney disease (HCC)   Acute on chronic combined systolic (congestive) and diastolic (congestive) heart failure ([place])   Acute hypoxemic respiratory failure ([place])    1.  Acute on chronic systolic congestive heart failure, improved after diuresis, currently appears euvolemic, on torsemide 40 mg daily 2.  Acute kidney injury, with underlying CKD stage  IIIb followed by nephrology 3.  Coronary artery disease, CABG times 07/31/1982 with LIMA to LAD/D1 RIMA to RCA, currently chest pain-free 4.  Ischemic cardiomyopathy, moderate to severely reduced left ventricular function  Recommendations  1.  Continue current medications 2.  Continue torsemide 40 mg daily 3.  No further cardiac diagnostics at this time 4.  Follow-up Dr. Ubaldo Glassing as outpatient   Isaias Cowman, MD, PhD, [facility] 12/03/2020 10:57 AM

## 2020-12-04 DIAGNOSIS — N179 Acute kidney failure, unspecified: Secondary | ICD-10-CM | POA: Diagnosis not present

## 2020-12-04 DIAGNOSIS — R0600 Dyspnea, unspecified: Secondary | ICD-10-CM | POA: Diagnosis not present

## 2020-12-04 DIAGNOSIS — I5043 Acute on chronic combined systolic (congestive) and diastolic (congestive) heart failure: Secondary | ICD-10-CM | POA: Diagnosis not present

## 2020-12-04 DIAGNOSIS — J9601 Acute respiratory failure with hypoxia: Secondary | ICD-10-CM | POA: Diagnosis not present

## 2020-12-04 LAB — BASIC METABOLIC PANEL
Anion gap: 10 (ref 5–15)
BUN: 94 mg/dL — ABNORMAL HIGH (ref 8–23)
CO2: 24 mmol/L (ref 22–32)
Calcium: 8.5 mg/dL — ABNORMAL LOW (ref 8.9–10.3)
Chloride: 93 mmol/L — ABNORMAL LOW (ref 98–111)
Creatinine, Ser: 2.79 mg/dL — ABNORMAL HIGH (ref 0.61–1.24)
GFR, Estimated: 21 mL/min — ABNORMAL LOW (ref >60)
Glucose, Bld: 100 mg/dL — ABNORMAL HIGH (ref 70–99)
Potassium: 3.8 mmol/L (ref 3.5–5.1)
Sodium: 127 mmol/L — ABNORMAL LOW (ref 135–145)

## 2020-12-04 LAB — HEMOGLOBIN AND HEMATOCRIT, BLOOD
HCT: 24.2 % — ABNORMAL LOW (ref 39.0–52.0)
Hemoglobin: 7.8 g/dL — ABNORMAL LOW (ref 13.0–17.0)

## 2020-12-04 NOTE — Progress Notes (Signed)
[facility] Cardiology  SUBJECTIVE: Patient sitting on side of bed, eating breakfast, reports doing well, and with improved breathing   Vitals:   12/03/20 1108 12/03/20 1613 12/03/20 2104 12/04/20 0720  BP: (!) 107/53 102/62 (!) 94/57 114/61  Pulse: 68 61 78 80  Resp: 18 18 17 19   Temp: 97.7 F (36.5 C) 97.6 F (36.4 C) 97.8 F (36.6 C) 98.1 F (36.7 C)  TempSrc:  Oral Oral   SpO2: 99% 98% 98% 98%  Weight:      Height:         Intake/Output Summary (Last 24 hours) at 12/04/2020 1037 Last data filed at 12/04/2020 0740 Gross per 24 hour  Intake --  Output 1225 ml  Net -1225 ml      PHYSICAL EXAM  General: Well developed, well nourished, in no acute distress HEENT:  Normocephalic and atramatic Neck:  No JVD.  Lungs: Clear bilaterally to auscultation and percussion. Heart: HRRR . Normal S1 and S2 without gallops or murmurs.  Abdomen: Bowel sounds are positive, abdomen soft and non-tender  Msk:  Back normal, normal gait. Normal strength and tone for age. Extremities: No clubbing, cyanosis or edema.   Neuro: Alert and oriented X 3. Psych:  Good affect, responds appropriately   LABS: Basic Metabolic Panel: Recent Labs    12/03/20 0403 12/04/20 0528  NA 127* 127*  K 4.8 3.8  CL 96* 93*  CO2 23 24  GLUCOSE 132* 100*  BUN 73* 94*  CREATININE 2.74* 2.79*  CALCIUM 8.3* 8.5*   Liver Function Tests: No results for input(s): AST, ALT, ALKPHOS, BILITOT, PROT, ALBUMIN in the last 72 hours. No results for input(s): LIPASE, AMYLASE in the last 72 hours. CBC: Recent Labs    12/03/20 0403 12/04/20 0528  WBC 6.3  --   HGB 7.8* 7.8*  HCT 23.7* 24.2*  MCV 101.3*  --   PLT 103*  --    Cardiac Enzymes: No results for input(s): CKTOTAL, CKMB, CKMBINDEX, TROPONINI in the last 72 hours. BNP: Invalid input(s): POCBNP D-Dimer: No results for input(s): DDIMER in the last 72 hours. Hemoglobin A1C: No results for input(s): HGBA1C in the last 72 hours. Fasting Lipid Panel: No  results for input(s): CHOL, HDL, LDLCALC, TRIG, CHOLHDL, LDLDIRECT in the last 72 hours. Thyroid Function Tests: No results for input(s): TSH, T4TOTAL, T3FREE, THYROIDAB in the last 72 hours.  Invalid input(s): FREET3 Anemia Panel: No results for input(s): VITAMINB12, FOLATE, FERRITIN, TIBC, IRON, RETICCTPCT in the last 72 hours.  No results found.   Echo LVEF 25 to 30% x 2 D echocardiogram 10/04/2020  TELEMETRY: Sinus rhythm 70 bpm:  ASSESSMENT AND PLAN:  Principal Problem:   Acute exacerbation of CHF (congestive heart failure) (HCC) Active Problems:   CAD (coronary artery disease)   HTN (hypertension)   Chronic atrial fibrillation (HCC)   Dyspnea on exertion   BPH (benign prostatic hyperplasia)   Gastroesophageal reflux disease with esophagitis   Thrombocytopenia (HCC)   HLD (hyperlipidemia)   MDS (myelodysplastic syndrome), low grade (HCC)   Hyperkalemia   Acute renal failure superimposed on stage 3b chronic kidney disease (HCC)   Acute on chronic combined systolic (congestive) and diastolic (congestive) heart failure ([place])   Acute hypoxemic respiratory failure ([place])    1. Acute on chronic systolic congestive heart failure, improved after diuresis, currently appears euvolemic, on torsemide 40 mg daily 2.  Acute kidney injury, with underlying CKD stage IIIb, good urine output, followed by nephrology 3.  Coronary artery disease,  CABG times 07/31/1982 with LIMA to LAD/D1 RIMA to RCA, currently chest pain-free 4.  Ischemic cardiomyopathy, moderate to severely reduced left ventricular function   Recommendations   1.  Continue current medications 2.  Continue torsemide 40 mg daily 3.  No further cardiac diagnostics at this time 4.  Follow-up Dr. Ubaldo Glassing as outpatient  Sign off for now, please call if any questions   Isaias Cowman, MD, PhD, [facility] 12/04/2020 10:37 AM

## 2020-12-04 NOTE — Progress Notes (Signed)
PROGRESS NOTE    Andres Gutierrez  JEH:[id number] DOB: 1931-03-24 DOA: 11/26/2020 PCP: Rusty Aus, MD   Chief complaint.  Shortness of breath. Brief Narrative:   Andres Gutierrez is a 87 y.o. male with medical history significant of CHF, CKD 3B, A. fib, BPH, CAD status post CABG, cerebral amyloid angiopathy, GERD, hyperlipidemia, hypertension, iron deficiency anemia, myelodysplastic syndrome who presents with ongoing increased dyspnea on exertion, edema, weight gain. Echocardiogram performed in May 2022 showed ejection fraction 25 to 30%.  He is started on IV Lasix 7/19.  Patient had a worsening short of breath already this morning, was placed on oxygen for hypoxemia. 7/20 BNP ELEVATED. Pt reports no sob while resting in bed. On lasix gtt.  7/21 still sob. Daughter at bedside. Spoke to pt about his code status while daughter was at bedside, after discussion, he agreed to DNR  status.  7/22- feels breathing is a little better.  7/23 had 200 cc UO today per nsg.  Denies sob /dizziness 7/24 -976 output today.  Assessment & Plan:   Principal Problem:   Acute exacerbation of CHF (congestive heart failure) (HCC) Active Problems:   CAD (coronary artery disease)   HTN (hypertension)   Chronic atrial fibrillation (HCC)   Dyspnea on exertion   BPH (benign prostatic hyperplasia)   Gastroesophageal reflux disease with esophagitis   Thrombocytopenia (HCC)   HLD (hyperlipidemia)   MDS (myelodysplastic syndrome), low grade (HCC)   Hyperkalemia   Acute renal failure superimposed on stage 3b chronic kidney disease (HCC)   Acute on chronic combined systolic (congestive) and diastolic (congestive) heart failure ([place])   Acute hypoxemic respiratory failure ([place])  #1. Acute on chronic combined systolic and diastolic congestive heart failure. Elevated troponin secondary to congestive heart failure. Acute hypoxemic respiratory failure. Patient had a worsening short of breath earlier this morning,  consistent with congestive heart failure exacerbation.  He also requiring oxygen. He had a slightly worsening renal function 2 days ago, Lasix was reduced to oral torsemide  7/20- was restarted on lasix gtt. Bnp elevated. Will d/w cards about ?  7/21 7/21 patient on 2 L nasal cannula.   7/23 bp on low side, asx. UO picked up today 7/24- feels better today. No sob, cp.reports had more UO today. On fluid restriction and torsemide decreased to 20mg  qd Continue metolzone 2.5MWF I/os Palliative following     2.  Acute kidney injury on chronic kidney disease stage IIIb. Hyperkalemia: Creatinine stabilizing Torsemide decreased to 20 mg daily.  Nephrology Urine output improving Continue to monitor levels    3. Hyponatremia Stabilizing at 127.  Asymptomatic. Continue to monitor. Likely from diuresis  4.  Coronary disease with status post CABG. Continue cardiac meds  4.  Anemia. Thrombocytopenia. Myelodysplastic syndrome. H&H stable.  Platelets stable.  Monitor periodically   DVT prophylaxis: SCDs Code Status: DNR.   Family Communication: daughter updated via phone Disposition Plan:    Status is: Inpatient  Remains inpatient appropriate because:IV treatments appropriate due to intensity of illness or inability to take PO and Inpatient level of care appropriate due to severity of illness  Dispo: The patient is from: Home              Anticipated d/c is to: SNF              Patient currently is not medically stable to d/c.   Difficult to place patient No        I/O last 3 completed shifts:  In: 240 [P.O.:240] Out: 38 [Urine:980] Total I/O In: -  Out: [place]     Consultants:  Cardiology and nephrology  Procedures: none  Antimicrobials: none   Subjective: No shortness of breath, chest pain.  No dizziness.  Reports feeling better today.   Objective: Vitals:   12/03/20 1108 12/03/20 1613 12/03/20 2104 12/04/20 0720  BP: (!) 107/53 102/62 (!)  94/57 114/61  Pulse: 68 61 78 80  Resp: 18 18 17 19   Temp: 97.7 F (36.5 C) 97.6 F (36.4 C) 97.8 F (36.6 C) 98.1 F (36.7 C)  TempSrc:  Oral Oral   SpO2: 99% 98% 98% 98%  Weight:      Height:        Intake/Output Summary (Last 24 hours) at 12/04/2020 1119 Last data filed at 12/04/2020 0740 Gross per 24 hour  Intake --  Output 1225 ml  Net -1225 ml   Filed Weights   12/01/20 0854 12/02/20 0604 12/03/20 0246  Weight: 81.1 kg 78.6 kg 78.2 kg    Examination: NAD, calm More clear to auscultation no wheeze rales rhonchi's Regular S1-S2 no gallops Soft benign positive bowel sounds No edema Grossly intact, awake and oriented  Data Reviewed: I have personally reviewed following labs and imaging studies  CBC: Recent Labs  Lab 11/28/20 0412 11/30/20 0538 12/03/20 0403 12/04/20 0528  WBC 5.5 8.3 6.3  --   NEUTROABS 1.8 3.7  --   --   HGB 8.6* 8.7* 7.8* 7.8*  HCT 26.0* 26.2* 23.7* 24.2*  MCV 100.8* 97.8 101.3*  --   PLT 88* 82* 103*  --    Basic Metabolic Panel: Recent Labs  Lab 11/28/20 0412 11/29/20 0556 11/30/20 0538 12/01/20 0837 12/02/20 0444 12/03/20 0403 12/04/20 0528  NA 135 133* 132* 132* 130* 127* 127*  K 4.1 3.9 4.1 4.1 4.6 4.8 3.8  CL 104 98 96* 94* 97* 96* 93*  CO2 24 24 23 24 22 23 24   GLUCOSE 83 87 93 116* 149* 132* 100*  BUN 46* 45* 48* 50* 59* 73* 94*  CREATININE 2.08* 1.96* 2.01* 2.04* 2.26* 2.74* 2.79*  CALCIUM 8.9 8.5* 9.2 9.0 8.8* 8.3* 8.5*  MG 1.5* 1.8 1.8  --   --   --   --    GFR: Estimated Creatinine Clearance: 20.6 mL/min (A) (by C-G formula based on SCr of 2.79 mg/dL (H)). Liver Function Tests: No results for input(s): AST, ALT, ALKPHOS, BILITOT, PROT, ALBUMIN in the last 168 hours.  No results for input(s): LIPASE, AMYLASE in the last 168 hours. No results for input(s): AMMONIA in the last 168 hours. Coagulation Profile: No results for input(s): INR, PROTIME in the last 168 hours. Cardiac Enzymes: No results for input(s):  CKTOTAL, CKMB, CKMBINDEX, TROPONINI in the last 168 hours. BNP (last 3 results) No results for input(s): PROBNP in the last 8760 hours. HbA1C: No results for input(s): HGBA1C in the last 72 hours. CBG: No results for input(s): GLUCAP in the last 168 hours.  Lipid Profile: No results for input(s): CHOL, HDL, LDLCALC, TRIG, CHOLHDL, LDLDIRECT in the last 72 hours. Thyroid Function Tests: No results for input(s): TSH, T4TOTAL, FREET4, T3FREE, THYROIDAB in the last 72 hours. Anemia Panel: No results for input(s): VITAMINB12, FOLATE, FERRITIN, TIBC, IRON, RETICCTPCT in the last 72 hours.  Sepsis Labs: No results for input(s): PROCALCITON, LATICACIDVEN in the last 168 hours.  Recent Results (from the past 240 hour(s))  Resp Panel by RT-PCR (Flu A&B, Covid) Nasopharyngeal Swab  Status: None   Collection Time: 11/26/20  4:13 PM   Specimen: Nasopharyngeal Swab; Nasopharyngeal(NP) swabs in vial transport medium  Result Value Ref Range Status   SARS Coronavirus 2 by RT PCR NEGATIVE NEGATIVE Final    Comment: (NOTE) SARS-CoV-2 target nucleic acids are NOT DETECTED.  The SARS-CoV-2 RNA is generally detectable in upper respiratory specimens during the acute phase of infection. The lowest concentration of SARS-CoV-2 viral copies this assay can detect is 138 copies/mL. A negative result does not preclude SARS-Cov-2 infection and should not be used as the sole basis for treatment or other patient management decisions. A negative result may occur with  improper specimen collection/handling, submission of specimen other than nasopharyngeal swab, presence of viral mutation(s) within the areas targeted by this assay, and inadequate number of viral copies(<138 copies/mL). A negative result must be combined with clinical observations, patient history, and epidemiological information. The expected result is Negative.  Fact Sheet for Patients:  EntrepreneurPulse.com.au  Fact  Sheet for Healthcare Providers:  IncredibleEmployment.be  This test is no t yet approved or cleared by the [place] FDA and  has been authorized for detection and/or diagnosis of SARS-CoV-2 by FDA under an Emergency Use Authorization (EUA). This EUA will remain  in effect (meaning this test can be used) for the duration of the COVID-19 declaration under Section 564(b)(1) of the Act, 21 U.S.C.section 360bbb-3(b)(1), unless the authorization is terminated  or revoked sooner.       Influenza A by PCR NEGATIVE NEGATIVE Final   Influenza B by PCR NEGATIVE NEGATIVE Final    Comment: (NOTE) The Xpert Xpress SARS-CoV-2/FLU/RSV plus assay is intended as an aid in the diagnosis of influenza from Nasopharyngeal swab specimens and should not be used as a sole basis for treatment. Nasal washings and aspirates are unacceptable for Xpert Xpress SARS-CoV-2/FLU/RSV testing.  Fact Sheet for Patients: EntrepreneurPulse.com.au  Fact Sheet for Healthcare Providers: IncredibleEmployment.be  This test is not yet approved or cleared by the [place] FDA and has been authorized for detection and/or diagnosis of SARS-CoV-2 by FDA under an Emergency Use Authorization (EUA). This EUA will remain in effect (meaning this test can be used) for the duration of the COVID-19 declaration under Section 564(b)(1) of the Act, 21 U.S.C. section 360bbb-3(b)(1), unless the authorization is terminated or revoked.  Performed at [facility], [address], [place], [place] [address]          Radiology Studies: No results found.      Scheduled Meds:  atorvastatin  40 mg Oral QHS   digoxin  0.0625 mg Oral Daily   ferrous sulfate  325 mg Oral Q M,W,F   hydrALAZINE  25 mg Oral BID   isosorbide mononitrate  30 mg Oral Daily   [START ON 12/05/2020] metolazone  2.5 mg Oral Q M,W,F   metoprolol tartrate  12.5 mg Oral BID   midodrine  10  mg Oral TID WC   ranolazine  500 mg Oral BID   sodium chloride flush  3 mL Intravenous Q12H   sucralfate  1 g Oral BID   tamsulosin  0.4 mg Oral QPC supper   torsemide  20 mg Oral Daily   Continuous Infusions:     LOS: 7 days    Time spent: 35 minutes, more than 50% time spent on direct patient care.    Nolberto Hanlon, MD [facility]   To contact the attending provider between 7A-7P or the covering provider during after hours 7P-7A, please  log into the web site [email or web address] and access using universal [place] password for that web site. If you do not have the password, please call the hospital operator.  12/04/2020, 11:19 AM

## 2020-12-04 NOTE — Progress Notes (Signed)
[facility]  PROGRESS NOTE   Subjective:   Feels much better this morning.  Family at bedside. Urine output is good.  Objective:  Vital signs in last 24 hours:  Temp:  [97.6 F (36.4 C)-98.1 F (36.7 C)] 98.1 F (36.7 C) (07/24 0720) Pulse Rate:  [61-80] 80 (07/24 0720) Resp:  [17-19] 19 (07/24 0720) BP: (94-114)/(53-62) 114/61 (07/24 0720) SpO2:  [98 %-99 %] 98 % (07/24 0720)  Weight change:  Filed Weights   12/01/20 0854 12/02/20 0604 12/03/20 0246  Weight: 81.1 kg 78.6 kg 78.2 kg    Intake/Output: I/O last 3 completed shifts: In: 240 [P.O.:240] Out: 43 [Urine:980]   Intake/Output this shift:  Total I/O In: -  Out: 675 [Urine:675]  Physical Exam: General:  No acute distress  Head:  Normocephalic, atraumatic. Moist oral mucosal membranes  Eyes:  Anicteric  Neck:  Supple  Lungs:   Clear to auscultation, normal effort  Heart:  S1S2 no rubs  Abdomen:   Soft, nontender, bowel sounds present  Extremities:  peripheral edema.  Neurologic:  Awake, alert, following commands  Skin:  No lesions  Access:     Basic Metabolic Panel: Recent Labs  Lab 11/28/20 0412 11/29/20 0556 11/30/20 0538 12/01/20 0837 12/02/20 0444 12/03/20 0403 12/04/20 0528  NA 135 133* 132* 132* 130* 127* 127*  K 4.1 3.9 4.1 4.1 4.6 4.8 3.8  CL 104 98 96* 94* 97* 96* 93*  CO2 24 24 23 24 22 23 24   GLUCOSE 83 87 93 116* 149* 132* 100*  BUN 46* 45* 48* 50* 59* 73* 94*  CREATININE 2.08* 1.96* 2.01* 2.04* 2.26* 2.74* 2.79*  CALCIUM 8.9 8.5* 9.2 9.0 8.8* 8.3* 8.5*  MG 1.5* 1.8 1.8  --   --   --   --     CBC: Recent Labs  Lab 11/28/20 0412 11/30/20 0538 12/03/20 0403 12/04/20 0528  WBC 5.5 8.3 6.3  --   NEUTROABS 1.8 3.7  --   --   HGB 8.6* 8.7* 7.8* 7.8*  HCT 26.0* 26.2* 23.7* 24.2*  MCV 100.8* 97.8 101.3*  --   PLT 88* 82* 103*  --      Urinalysis: No results for input(s): COLORURINE, LABSPEC, PHURINE, GLUCOSEU, HGBUR, BILIRUBINUR, KETONESUR, PROTEINUR,  UROBILINOGEN, NITRITE, LEUKOCYTESUR in the last 72 hours.  Invalid input(s): APPERANCEUR    Imaging: No results found.   Medications:     atorvastatin  40 mg Oral QHS   digoxin  0.0625 mg Oral Daily   ferrous sulfate  325 mg Oral Q M,W,F   hydrALAZINE  25 mg Oral BID   isosorbide mononitrate  30 mg Oral Daily   [START ON 12/05/2020] metolazone  2.5 mg Oral Q M,W,F   metoprolol tartrate  12.5 mg Oral BID   midodrine  10 mg Oral TID WC   ranolazine  500 mg Oral BID   sodium chloride flush  3 mL Intravenous Q12H   sucralfate  1 g Oral BID   tamsulosin  0.4 mg Oral QPC supper   torsemide  20 mg Oral Daily    Assessment/ Plan:     Principal Problem:   Acute exacerbation of CHF (congestive heart failure) (HCC) Active Problems:   CAD (coronary artery disease)   HTN (hypertension)   Chronic atrial fibrillation (HCC)   Dyspnea on exertion   BPH (benign prostatic hyperplasia)   Gastroesophageal reflux disease with esophagitis   Thrombocytopenia (HCC)   HLD (hyperlipidemia)   MDS (myelodysplastic syndrome), low  grade (HCC)   Hyperkalemia   Acute renal failure superimposed on stage 3b chronic kidney disease (HCC)   Acute on chronic combined systolic (congestive) and diastolic (congestive) heart failure (HCC)   Acute hypoxemic respiratory failure ([place])  87 year old male with a history of hypertension, coronary artery disease, congestive heart failure, atrial fibrillation, hyperlipidemia and chronic kidney disease stage IIIb/IV now admitted with history of congestive heart failure.  #1: Acute kidney injury on CKD: Possibly due to overdiuresis.  CKD, stage IV is most likely due to chronic ischemic nephropathy.  CT scan did not show any evidence of obstructive uropathy.   #2: Hyponatremia: Possibly secondary to diuretic effect.  Advised him to stay on fluid restriction of 35 to 40 ounces daily.   #3: Congestive heart failure: We will reduce to torsemide 20 mg daily with 1 L fluid  restriction  #4: Anemia: Possibly secondary to his CKD.  Patient has been followed by hematology.   Spoke to the patient and his daughter at bedside last admission importance of outpatient follow-up.   LOS: [place], MD [facility] 7/24/202210:24 AM

## 2020-12-05 ENCOUNTER — Inpatient Hospital Stay: Payer: PPO

## 2020-12-05 DIAGNOSIS — I5043 Acute on chronic combined systolic (congestive) and diastolic (congestive) heart failure: Secondary | ICD-10-CM | POA: Diagnosis not present

## 2020-12-05 DIAGNOSIS — R0600 Dyspnea, unspecified: Secondary | ICD-10-CM | POA: Diagnosis not present

## 2020-12-05 DIAGNOSIS — J9601 Acute respiratory failure with hypoxia: Secondary | ICD-10-CM | POA: Diagnosis not present

## 2020-12-05 DIAGNOSIS — N179 Acute kidney failure, unspecified: Secondary | ICD-10-CM | POA: Diagnosis not present

## 2020-12-05 LAB — BODY FLUID CELL COUNT WITH DIFFERENTIAL
Eos, Fluid: 0 %
Lymphs, Fluid: 58 %
Monocyte-Macrophage-Serous Fluid: 40 %
Neutrophil Count, Fluid: 2 %
Total Nucleated Cell Count, Fluid: 1030 cu mm

## 2020-12-05 LAB — PROTEIN, PLEURAL OR PERITONEAL FLUID: Total protein, fluid: <3 g/dL

## 2020-12-05 LAB — LACTATE DEHYDROGENASE, PLEURAL OR PERITONEAL FLUID: LD, Fluid: 64 U/L — ABNORMAL HIGH (ref 3–23)

## 2020-12-05 LAB — BASIC METABOLIC PANEL
Anion gap: 9 (ref 5–15)
BUN: 94 mg/dL — ABNORMAL HIGH (ref 8–23)
CO2: 25 mmol/L (ref 22–32)
Calcium: 8.8 mg/dL — ABNORMAL LOW (ref 8.9–10.3)
Chloride: 97 mmol/L — ABNORMAL LOW (ref 98–111)
Creatinine, Ser: 2.73 mg/dL — ABNORMAL HIGH (ref 0.61–1.24)
GFR, Estimated: 22 mL/min — ABNORMAL LOW (ref >60)
Glucose, Bld: 90 mg/dL (ref 70–99)
Potassium: 3.3 mmol/L — ABNORMAL LOW (ref 3.5–5.1)
Sodium: 131 mmol/L — ABNORMAL LOW (ref 135–145)

## 2020-12-05 LAB — GLUCOSE, PLEURAL OR PERITONEAL FLUID: Glucose, Fluid: 103 mg/dL

## 2020-12-05 LAB — AMYLASE, PLEURAL OR PERITONEAL FLUID: Amylase, Fluid: 50 U/L

## 2020-12-05 MED ORDER — POTASSIUM CHLORIDE CRYS ER 20 MEQ PO TBCR
40.0000 meq | EXTENDED_RELEASE_TABLET | Freq: Once | ORAL | Status: AC
  Administered 2020-12-05: 40 meq via ORAL
  Filled 2020-12-05: qty 2

## 2020-12-05 MED ORDER — DICYCLOMINE HCL 20 MG PO TABS
20.0000 mg | ORAL_TABLET | Freq: Once | ORAL | Status: AC
  Administered 2020-12-05: 20 mg via ORAL
  Filled 2020-12-05: qty 1

## 2020-12-05 NOTE — Progress Notes (Addendum)
[facility]  ROUNDING NOTE   Subjective:   Andres Gutierrez is a 87 y.o. male with medical problems of congestive heart failure, chronic kidney disease, atrial fibrillation, BPH, coronary disease status post CABG, cerebral amyloid angiopathy, GERD, hyperlipidemia, hypertension and myelodysplastic syndrome.  He was originally admitted for increased dyspnea on exertion, worsening edema and weight gain.  Patient seen resting in bed Just completed ambulating with PT Tolerated well He feels better, getting strength back    Objective:  Vital signs in last 24 hours:  Temp:  [97.5 F (36.4 C)-98.8 F (37.1 C)] 98.1 F (36.7 C) (07/25 0726) Pulse Rate:  [66-72] 71 (07/25 0726) Resp:  [18] 18 (07/25 0726) BP: (92-117)/(49-65) 92/49 (07/25 1121) SpO2:  [97 %-100 %] 100 % (07/25 1121) Weight:  [78.7 kg] 78.7 kg (07/25 0320)  Weight change:  Filed Weights   12/02/20 0604 12/03/20 0246 12/05/20 0320  Weight: 78.6 kg 78.2 kg 78.7 kg    Intake/Output: I/O last 3 completed shifts: In: 240 [P.O.:240] Out: 1826 [Urine:1825; Stool:1]   Intake/Output this shift:  Total I/O In: 120 [P.O.:120] Out: 700 [Urine:700]  Physical Exam: General: NAD, sitting at bedside  Head: Normocephalic, atraumatic. Moist oral mucosal membranes  Eyes: Anicteric  Lungs:  LLL Wheeze, improved throughout, [place] 2L  Heart: Regular rate and rhythm  Abdomen:  Soft, nontender  Extremities:  no peripheral edema.  Neurologic: Nonfocal, moving all four extremities  Skin: No lesions       Basic Metabolic Panel: Recent Labs  Lab 11/29/20 0556 11/30/20 0538 12/01/20 0837 12/02/20 0444 12/03/20 0403 12/04/20 0528 12/05/20 0553  NA 133* 132* 132* 130* 127* 127* 131*  K 3.9 4.1 4.1 4.6 4.8 3.8 3.3*  CL 98 96* 94* 97* 96* 93* 97*  CO2 _0 GLUCOSE 87 93 116* 149* 132* 100* 90  BUN 45* 48* 50* 59* 73* 94* 94*  CREATININE 1.96* 2.01* 2.04* 2.26* 2.74* 2.79* 2.73*  CALCIUM 8.5* 9.2  9.0 8.8* 8.3* 8.5* 8.8*  MG 1.8 1.8  --   --   --   --   --      Liver Function Tests: No results for input(s): AST, ALT, ALKPHOS, BILITOT, PROT, ALBUMIN in the last 168 hours.  No results for input(s): LIPASE, AMYLASE in the last 168 hours. No results for input(s): AMMONIA in the last 168 hours.  CBC: Recent Labs  Lab 11/30/20 0538 12/03/20 0403 12/04/20 0528  WBC 8.3 6.3  --   NEUTROABS 3.7  --   --   HGB 8.7* 7.8* 7.8*  HCT 26.2* 23.7* 24.2*  MCV 97.8 101.3*  --   PLT 82* 103*  --      Cardiac Enzymes: No results for input(s): CKTOTAL, CKMB, CKMBINDEX, TROPONINI in the last 168 hours.  BNP: Invalid input(s): POCBNP  CBG: No results for input(s): GLUCAP in the last 168 hours.  Microbiology: Results for orders placed or performed during the hospital encounter of 11/26/20  Resp Panel by RT-PCR (Flu A&B, Covid) Nasopharyngeal Swab     Status: None   Collection Time: 11/26/20  4:13 PM   Specimen: Nasopharyngeal Swab; Nasopharyngeal(NP) swabs in vial transport medium  Result Value Ref Range Status   SARS Coronavirus 2 by RT PCR NEGATIVE NEGATIVE Final    Comment: (NOTE) SARS-CoV-2 target nucleic acids are NOT DETECTED.  The SARS-CoV-2 RNA is generally detectable in upper respiratory specimens during the acute phase of infection. The lowest concentration of  SARS-CoV-2 viral copies this assay can detect is 138 copies/mL. A negative result does not preclude SARS-Cov-2 infection and should not be used as the sole basis for treatment or other patient management decisions. A negative result may occur with  improper specimen collection/handling, submission of specimen other than nasopharyngeal swab, presence of viral mutation(s) within the areas targeted by this assay, and inadequate number of viral copies(<138 copies/mL). A negative result must be combined with clinical observations, patient history, and epidemiological information. The expected result is  Negative.  Fact Sheet for Patients:  EntrepreneurPulse.com.au  Fact Sheet for Healthcare Providers:  IncredibleEmployment.be  This test is no t yet approved or cleared by the [place] FDA and  has been authorized for detection and/or diagnosis of SARS-CoV-2 by FDA under an Emergency Use Authorization (EUA). This EUA will remain  in effect (meaning this test can be used) for the duration of the COVID-19 declaration under Section 564(b)(1) of the Act, 21 U.S.C.section 360bbb-3(b)(1), unless the authorization is terminated  or revoked sooner.       Influenza A by PCR NEGATIVE NEGATIVE Final   Influenza B by PCR NEGATIVE NEGATIVE Final    Comment: (NOTE) The Xpert Xpress SARS-CoV-2/FLU/RSV plus assay is intended as an aid in the diagnosis of influenza from Nasopharyngeal swab specimens and should not be used as a sole basis for treatment. Nasal washings and aspirates are unacceptable for Xpert Xpress SARS-CoV-2/FLU/RSV testing.  Fact Sheet for Patients: EntrepreneurPulse.com.au  Fact Sheet for Healthcare Providers: IncredibleEmployment.be  This test is not yet approved or cleared by the [place] FDA and has been authorized for detection and/or diagnosis of SARS-CoV-2 by FDA under an Emergency Use Authorization (EUA). This EUA will remain in effect (meaning this test can be used) for the duration of the COVID-19 declaration under Section 564(b)(1) of the Act, 21 U.S.C. section 360bbb-3(b)(1), unless the authorization is terminated or revoked.  Performed at [facility], [address] [place], [place] [address]     Coagulation Studies: No results for input(s): LABPROT, INR in the last 72 hours.  Urinalysis: No results for input(s): COLORURINE, LABSPEC, PHURINE, GLUCOSEU, HGBUR, BILIRUBINUR, KETONESUR, PROTEINUR, UROBILINOGEN, NITRITE, LEUKOCYTESUR in the last 72 hours.  Invalid  input(s): APPERANCEUR    Imaging: DG Chest Port 1 View  Result Date: 12/05/2020 CLINICAL DATA:  Status post right thoracentesis. EXAM: PORTABLE CHEST 1 VIEW COMPARISON:  December 02, 2020. FINDINGS: Stable cardiomegaly. Sternotomy wires are noted. No definite pneumothorax is noted status post thoracentesis. Pleural effusion appears to be significantly smaller. IMPRESSION: No definite pneumothorax status post right thoracentesis. Electronically Signed   By: Marijo Conception M.D.   On: 12/05/2020 11:46   CT RENAL STONE STUDY  Result Date: 12/05/2020 CLINICAL DATA:  Flank pain, kidney stone suspected EXAM: CT ABDOMEN AND PELVIS WITHOUT CONTRAST TECHNIQUE: Multidetector CT imaging of the abdomen and pelvis was performed following the standard protocol without IV contrast. COMPARISON:  10/03/2020 FINDINGS: Lower chest: There is a moderate to large right pleural effusion and small left pleural effusion. Pleural thickening and calcifications overlie the posterior medial right lung. Hepatobiliary: No focal liver abnormality. Status post cholecystectomy. No bile duct dilatation. Pancreas: No pancreas inflammation, mass or main duct dilatation. Spleen: Normal appearance of the spleen. Adrenals/urinary tract: Normal appearance of the adrenal glands. 3 mm stone identified within the upper pole of the left kidney. 2 mm calcification noted within upper pole of right kidney. Bilateral kidney cysts are again noted and are incompletely characterized without IV  contrast. The largest is in the lateral cortex of the upper pole of left kidney measuring 2.8 centimeters, image 98/5. No hydronephrosis identified bilaterally. No hydroureter or ureterolithiasis. Urinary bladder appears normal. Stomach/bowel: Stomach is normal. Status post right hemicolectomy with enterocolonic anastomosis. No bowel wall thickening, inflammation, or distension. Vascular/lymphatic: Aortic atherosclerosis. No aneurysm. Calcified lymph nodes identified  within the upper abdomen compatible with prior granulomatous disease. No abdominopelvic adenopathy. Reproductive: Normal. Other: Small volume of free fluid noted within the abdomen and pelvis, increased from previous exam. No discrete fluid collections. No abdominal wall hernia. Musculoskeletal: 7 millimeters anterolisthesis L4 on L5. Multilevel degenerative disc disease is identified throughout the thoracic and lumbar spine. L4-5 and L5-S1 facet arthropathy. IMPRESSION: 1. Nonobstructing small bilateral renal calculi as described above. 2. Cardiac enlargement and bilateral pleural effusions concerning for congestive heart failure. 3. Small volume of free fluid within the abdomen and pelvis, increased from previous exam. 4. Marked thoracolumbar spondylosis. Electronically Signed   By: Kerby Moors M.D.   On: 12/05/2020 06:18     Medications:      atorvastatin  40 mg Oral QHS   digoxin  0.0625 mg Oral Daily   ferrous sulfate  325 mg Oral Q M,W,F   hydrALAZINE  25 mg Oral BID   isosorbide mononitrate  30 mg Oral Daily   metolazone  2.5 mg Oral Q M,W,F   metoprolol tartrate  12.5 mg Oral BID   midodrine  10 mg Oral TID WC   ranolazine  500 mg Oral BID   sodium chloride flush  3 mL Intravenous Q12H   sucralfate  1 g Oral BID   tamsulosin  0.4 mg Oral QPC supper   torsemide  20 mg Oral Daily   acetaminophen **OR** acetaminophen, ipratropium-albuterol, polyethylene glycol  Assessment/ Plan:  Andres Gutierrez is a 87 y.o.  male with medical problems of congestive heart failure, chronic kidney disease, atrial fibrillation, BPH, coronary disease status post CABG, cerebral amyloid angiopathy, GERD, hyperlipidemia, hypertension and myelodysplastic syndrome.  He was originally admitted for increased dyspnea on exertion, worsening edema and weight gain.  Acute exacerbation of CHF (congestive heart failure) (HCC) [I50.9] Dyspnea, unspecified type [R06.00] Acute on chronic congestive heart failure,  unspecified heart failure type ([place]) [I50.9] Acute on chronic combined systolic (congestive) and diastolic (congestive) heart failure (HCC) [I50.43]   CKD st 3B with subacute worseining. Baseline 1.6 and eGFR 41 on 08/11/20 -CKD risk factors include atherosclerosis, hypertension and advanced age - CT abd/pelvis in May shows scattered bilateral cyst, 2 mm nonobstructing stone in left kidney, no hydronephrosis.  - Creatinine peaked yesterday, improved today - Continues torsemide and Metolazone - Will follow up with our office at discharge   2. Volume overload and acute on chronic systolic and diastolic CHF -2D ECHO 08/15/50-YELY 25-30%, severely decreased function with global hypokinesis, moderate LVH and grade 2 diastolic dysfunction.  -progressive shortness of breath requiring oxygen supplementation worse from baseline - clinically improved today.  - Torsemide 32m daily and Metolazone 2.567mMWF.       LOS: 8   7/25/202211:54 AM

## 2020-12-05 NOTE — Progress Notes (Signed)
OT Cancellation Note  Patient Details Name: Andres Gutierrez MRN: [id number] DOB: 01-30-1931   Cancelled Treatment:    Reason Eval/Treat Not Completed: Patient declined, no reason specified  OT attempted to engage pt in treatment, but pt declined, stating he was fatigued from earlier PT session.  Will continue to follow up at next opportunity.  Jeneen Montgomery, OTR/L 12/05/20, 2:23 PM

## 2020-12-05 NOTE — Care Management Important Message (Signed)
Important Message  Patient Details  Name: Andres Gutierrez MRN: [id number] Date of Birth: 04-Jan-1931   Medicare Important Message Given:  Yes     [name] 12/05/2020, 2:36 PM

## 2020-12-05 NOTE — Progress Notes (Addendum)
Physical Therapy Treatment Patient Details Name: Andres Gutierrez MRN: [id number] DOB: June 29, 1930 Today's Date: 12/05/2020    History of Present Illness Andres Gutierrez is a 87 y.o. male with medical history significant of CHF, CKD 3B, A. fib, BPH, CAD status post CABG, cerebral amyloid angiopathy, GERD, hyperlipidemia, hypertension, iron deficiency anemia, myelodysplastic syndrome who presents with ongoing increased dyspnea on exertion, edema, weight gain. Pt is being treated for acute on chronic combined systolic & diastolic congestive heart failure.    PT Comments    Pt seen for PT tx with pt finishing bathing sitting EOB. Pt endorses R groin pain but agreeable to ambulating. Pt is able to ambulate 1 lap around pod with RW & supervision, decreased gait speed, and pt pushing RW then stepping vs fluid movement despite ongoing cuing. Pt then requests to lie down & nap. Will continue to follow pt acutely to address endurance & balance & gait with LRAD.  Prior to admission pt lived alone & family has reported they cannot provide supervision at home. If someone can provide supervision upon d/c pt can go home with HHPT & supervision, otherwise pt would benefit from STR to maximize independence with mobility & decrease fall risk prior to return home alone.     Follow Up Recommendations  Supervision for mobility/OOB;SNF,      Equipment Recommendations  None recommended by PT    Recommendations for Other Services       Precautions / Restrictions Precautions Precautions: Fall Precaution Comments: pt with LLE surgical shoe (per daughter podiatrist wants him to wear that shoe, he visits podiatrists every 2-4 weeks for tx of calluses) Restrictions Weight Bearing Restrictions: No    Mobility  Bed Mobility Overal bed mobility: Modified Independent Bed Mobility: Sit to Supine       Sit to supine: Modified independent (Device/Increase time)        Transfers Overall transfer level: Modified  independent Equipment used: Rolling walker (2 wheeled) Transfers: Sit to/from Stand Sit to Stand: Modified independent (Device/Increase time)            Ambulation/Gait Ambulation/Gait assistance: Supervision Gait Distance (Feet): 100 Feet Assistive device: Rolling walker (2 wheeled) Gait Pattern/deviations: Decreased step length - right;Decreased step length - left;Decreased stride length;Trunk flexed     General Gait Details: Pt pushes RW too far out in front.   Stairs             Wheelchair Mobility    Modified Rankin (Stroke Patients Only)       Balance Overall balance assessment: Needs assistance Sitting-balance support: Feet supported;Bilateral upper extremity supported Sitting balance-[name]: Good     Standing balance support: During functional activity;Bilateral upper extremity supported;No upper extremity supported Standing balance-[name]: Fair Standing balance comment: Pt able to stand to pull underwear below hips with supervision without UE support                            Cognition Arousal/Alertness: Awake/alert Behavior During Therapy: WFL for tasks assessed/performed Overall Cognitive Status: Within Functional Limits for tasks assessed                                        Exercises      General Comments General comments (skin integrity, edema, etc.): Pt on 2L/min via nasal cannula, Spo2 >90%, pt c/o minimal SOB with  PT reinforcing need for pursed lip breathing      Pertinent Vitals/Pain Pain Assessment: 0-10 Pain Score: 3  Pain Location: R groin Pain Descriptors / Indicators: Sore Pain Intervention(s): Limited activity within patient's tolerance;Monitored during session;Patient requesting pain meds-RN notified;RN gave pain meds during session    Home Living                      Prior Function            PT Goals (current goals can now be found in the care plan section) Acute Rehab  PT Goals Patient Stated Goal: get better, go to rehab PT Goal Formulation: With patient/family Time For Goal Achievement: 12/11/20 Potential to Achieve Goals: Good Progress towards PT goals: Progressing toward goals    Frequency    Min 2X/week      PT Plan Discharge plan needs to be updated    Co-evaluation              AM-PAC PT "6 Clicks" Mobility   Outcome Measure  Help needed turning from your back to your side while in a flat bed without using bedrails?: None Help needed moving from lying on your back to sitting on the side of a flat bed without using bedrails?: None Help needed moving to and from a bed to a chair (including a wheelchair)?: A Little Help needed standing up from a chair using your arms (e.g., wheelchair or bedside chair)?: None Help needed to walk in hospital room?: A Little Help needed climbing 3-5 steps with a railing? : A Little 6 Click Score: 21    End of Session Equipment Utilized During Treatment: Oxygen;Gait belt Activity Tolerance: Patient tolerated treatment well Patient left: in bed;with call bell/phone within reach;with bed alarm set   PT Visit Diagnosis: Muscle weakness (generalized) (M62.81);Difficulty in walking, not elsewhere classified (R26.2)     Time: 1410-3013 PT Time Calculation (min) (ACUTE ONLY): 19 min  Charges:  $Therapeutic Activity: 8-22 mins                     Andres Gutierrez, PT, DPT 12/05/20, 1:24 PM    Andres Gutierrez 12/05/2020, 1:23 PM

## 2020-12-05 NOTE — Progress Notes (Signed)
[facility]    SUBJECTIVE: Status postthoracentesis patient feels reasonably well no fever improved shortness of breath.  Denies any chest pain has ambulated well.  No leg edema no cough no sputum production no chest   Vitals:   12/05/20 1100 12/05/20 1121 12/05/20 1153 12/05/20 1209  BP: 94/61 (!) 92/49 (!) 100/48 (!) 99/55  Pulse:   63 65  Resp:    17  Temp:   97.8 F (36.6 C) 98.1 F (36.7 C)  TempSrc:      SpO2: 100% 100% 99% 98%  Weight:      Height:         Intake/Output Summary (Last 24 hours) at 12/05/2020 1213 Last data filed at 12/05/2020 1212 Gross per 24 hour  Intake 600 ml  Output 1550 ml  Net -950 ml      PHYSICAL EXAM  General: Well developed, well nourished, in no acute distress HEENT:  Normocephalic and atramatic Neck:  No JVD.  Lungs: Clear bilaterally to auscultation and percussion. Heart: HRRR . Normal S1 and S2 without gallops or murmurs.  Abdomen: Bowel sounds are positive, abdomen soft and non-tender  Msk:  Back normal, normal gait. Normal strength and tone for age. Extremities: No clubbing, cyanosis or edema.   Neuro: Alert and oriented X 3. Psych:  Good affect, responds appropriately   LABS: Basic Metabolic Panel: Recent Labs    12/04/20 0528 12/05/20 0553  NA 127* 131*  K 3.8 3.3*  CL 93* 97*  CO2 24 25  GLUCOSE 100* 90  BUN 94* 94*  CREATININE 2.79* 2.73*  CALCIUM 8.5* 8.8*   Liver Function Tests: No results for input(s): AST, ALT, ALKPHOS, BILITOT, PROT, ALBUMIN in the last 72 hours. No results for input(s): LIPASE, AMYLASE in the last 72 hours. CBC: Recent Labs    12/03/20 0403 12/04/20 0528  WBC 6.3  --   HGB 7.8* 7.8*  HCT 23.7* 24.2*  MCV 101.3*  --   PLT 103*  --    Cardiac Enzymes: No results for input(s): CKTOTAL, CKMB, CKMBINDEX, TROPONINI in the last 72 hours. BNP: Invalid input(s): POCBNP D-Dimer: No results for input(s): DDIMER in the last 72 hours. Hemoglobin A1C: No results for input(s): HGBA1C  in the last 72 hours. Fasting Lipid Panel: No results for input(s): CHOL, HDL, LDLCALC, TRIG, CHOLHDL, LDLDIRECT in the last 72 hours. Thyroid Function Tests: No results for input(s): TSH, T4TOTAL, T3FREE, THYROIDAB in the last 72 hours.  Invalid input(s): FREET3 Anemia Panel: No results for input(s): VITAMINB12, FOLATE, FERRITIN, TIBC, IRON, RETICCTPCT in the last 72 hours.  DG Chest Port 1 View  Result Date: 12/05/2020 CLINICAL DATA:  Status post right thoracentesis. EXAM: PORTABLE CHEST 1 VIEW COMPARISON:  December 02, 2020. FINDINGS: Stable cardiomegaly. Sternotomy wires are noted. No definite pneumothorax is noted status post thoracentesis. Pleural effusion appears to be significantly smaller. IMPRESSION: No definite pneumothorax status post right thoracentesis. Electronically Signed   By: Marijo Conception M.D.   On: 12/05/2020 11:46   CT RENAL STONE STUDY  Result Date: 12/05/2020 CLINICAL DATA:  Flank pain, kidney stone suspected EXAM: CT ABDOMEN AND PELVIS WITHOUT CONTRAST TECHNIQUE: Multidetector CT imaging of the abdomen and pelvis was performed following the standard protocol without IV contrast. COMPARISON:  10/03/2020 FINDINGS: Lower chest: There is a moderate to large right pleural effusion and small left pleural effusion. Pleural thickening and calcifications overlie the posterior medial right lung. Hepatobiliary: No focal liver abnormality. Status post cholecystectomy. No bile duct dilatation.  Pancreas: No pancreas inflammation, mass or main duct dilatation. Spleen: Normal appearance of the spleen. Adrenals/urinary tract: Normal appearance of the adrenal glands. 3 mm stone identified within the upper pole of the left kidney. 2 mm calcification noted within upper pole of right kidney. Bilateral kidney cysts are again noted and are incompletely characterized without IV contrast. The largest is in the lateral cortex of the upper pole of left kidney measuring 2.8 centimeters, image 98/5. No  hydronephrosis identified bilaterally. No hydroureter or ureterolithiasis. Urinary bladder appears normal. Stomach/bowel: Stomach is normal. Status post right hemicolectomy with enterocolonic anastomosis. No bowel wall thickening, inflammation, or distension. Vascular/lymphatic: Aortic atherosclerosis. No aneurysm. Calcified lymph nodes identified within the upper abdomen compatible with prior granulomatous disease. No abdominopelvic adenopathy. Reproductive: Normal. Other: Small volume of free fluid noted within the abdomen and pelvis, increased from previous exam. No discrete fluid collections. No abdominal wall hernia. Musculoskeletal: 7 millimeters anterolisthesis L4 on L5. Multilevel degenerative disc disease is identified throughout the thoracic and lumbar spine. L4-5 and L5-S1 facet arthropathy. IMPRESSION: 1. Nonobstructing small bilateral renal calculi as described above. 2. Cardiac enlargement and bilateral pleural effusions concerning for congestive heart failure. 3. Small volume of free fluid within the abdomen and pelvis, increased from previous exam. 4. Marked thoracolumbar spondylosis. Electronically Signed   By: Kerby Moors M.D.   On: 12/05/2020 06:18     Echo depressed left ventricular function globally EF less than 30%  TELEMETRY: Palpable atrial fibrillation nonspecific ST-T wave changes interventricular conduction :  ASSESSMENT AND PLAN:  Principal Problem:   Acute exacerbation of CHF (congestive heart failure) (HCC) Active Problems:   CAD (coronary artery disease)   HTN (hypertension)   Chronic atrial fibrillation (HCC)   Dyspnea on exertion   BPH (benign prostatic hyperplasia)   Gastroesophageal reflux disease with esophagitis   Thrombocytopenia (HCC)   HLD (hyperlipidemia)   MDS (myelodysplastic syndrome), low grade (HCC)   Hyperkalemia   Acute renal failure superimposed on stage 3b chronic kidney disease (HCC)   Acute on chronic combined systolic (congestive) and  diastolic (congestive) heart failure (HCC)   Acute hypoxemic respiratory failure (HCC)   Plan Status postthoracentesis with 2 L from right lung continue current therapy hopefully this will help with some of his dyspnea shortness of breath Continue diuretic therapy for systolic congestive heart failure metolazone 3 days a week continue combination Imdur hydralazine we will try to advance hydralazine dose Continue to avoid ARB or ACE or Arni with severe renal insufficiency Shortness of breath mostly related to heart failure and cardiomyopathy patient also has underlying COPD continue current therapy for both Atrial fibrillation rate control with metoprolol /digoxin currently not on anticoagulation Discontinue Ranexa Continue inhalers for COPD type symptoms Increase activity ambulate to help with condition Have the patient follow-up with nephrology for significant renal insufficiency     Yolonda Kida, MD 12/05/2020 12:13 PM

## 2020-12-05 NOTE — Plan of Care (Signed)

## 2020-12-05 NOTE — Procedures (Signed)
PROCEDURE SUMMARY:  Successful image-guided right thoracentesis. Yielded 2.0 liters of clear yellow fluid. Patient tolerated procedure well. EBL < 1 mL No immediate complications.  Specimen was sent for labs. Post procedure CXR shows no pneumothorax.  Pleural fluid also noted on the left which is potentially amenable to percutaneous drainage if desired.  Please see imaging section of Epic for full dictation.  Joaquim Nam PA-C 12/05/2020 11:27 AM

## 2020-12-05 NOTE — Progress Notes (Signed)
PROGRESS NOTE    HAILEY MILES  RDE:[id number] DOB: 1930/07/23 DOA: 11/26/2020 PCP: Rusty Aus, MD   Chief complaint.  Shortness of breath. Brief Narrative:   DARRYL WILLNER is a 87 y.o. male with medical history significant of CHF, CKD 3B, A. fib, BPH, CAD status post CABG, cerebral amyloid angiopathy, GERD, hyperlipidemia, hypertension, iron deficiency anemia, myelodysplastic syndrome who presents with ongoing increased dyspnea on exertion, edema, weight gain. Echocardiogram performed in May 2022 showed ejection fraction 25 to 30%.  He is started on IV Lasix 7/19.  Patient had a worsening short of breath already this morning, was placed on oxygen for hypoxemia. 7/20 BNP ELEVATED. Pt reports no sob while resting in bed. On lasix gtt.  7/21 still sob. Daughter at bedside. Spoke to pt about his code status while daughter was at bedside, after discussion, he agreed to DNR  status.  7/22- feels breathing is a little better.  7/23 had 200 cc UO today per nsg.  Denies sob /dizziness 7/24 -976 output today.   7/25- was c/o suprapubic pain. Concern for renal stone. Ct renal completed, with non obstructive stone   Assessment & Plan:   Principal Problem:   Acute exacerbation of CHF (congestive heart failure) (HCC) Active Problems:   CAD (coronary artery disease)   HTN (hypertension)   Chronic atrial fibrillation (HCC)   Dyspnea on exertion   BPH (benign prostatic hyperplasia)   Gastroesophageal reflux disease with esophagitis   Thrombocytopenia (HCC)   HLD (hyperlipidemia)   MDS (myelodysplastic syndrome), low grade (HCC)   Hyperkalemia   Acute renal failure superimposed on stage 3b chronic kidney disease (HCC)   Acute on chronic combined systolic (congestive) and diastolic (congestive) heart failure ([place])   Acute hypoxemic respiratory failure ([place])  #1. Acute on chronic combined systolic and diastolic congestive heart failure. Elevated troponin secondary to congestive heart  failure. Acute hypoxemic respiratory failure. Patient had a worsening short of breath earlier this morning, consistent with congestive heart failure exacerbation.  He also requiring oxygen. He had a slightly worsening renal function 2 days ago, Lasix was reduced to oral torsemide  7/20- was restarted on lasix gtt. Bnp elevated. Will d/w cards about ?  7/21 7/21 patient on 2 L nasal cannula.   7/23 bp on low side, asx. UO picked up today 7/24- feels better today. No sob, cp.reports had more UO today. 7/25 on CT scan patient found with right moderate to large pleural effusion.  Currently on 2 L O2.  Denies too much shortness of breath but not at baseline.   Continue fluid restriction and torsemide  Continue metolazone 2.5 mg Monday Wednesday Friday  Will consult IR for right thoracentesis  I's and O's  Palliative following        2.  Acute kidney injury on chronic kidney disease stage IIIb. Hyperkalemia: Renal function stabilizing Continue torsemide Nephrology following UO better      3. Hyponatremia Asx Na 131 today Improving with decreasing diuretic dose and fluid restriction  4.  Coronary disease with status post CABG. Continue cardiac meds  5.  Anemia. Thrombocytopenia. Myelodysplastic syndrome. H&H stable.  Platelets stable.  Monitor periodically   DVT prophylaxis: SCDs Code Status: DNR.   Family Communication: daughter updated via phone Disposition Plan:    Status is: Inpatient  Remains inpatient appropriate because:IV treatments appropriate due to intensity of illness or inability to take PO and Inpatient level of care appropriate due to severity of illness  Dispo: The patient  is from: Home              Anticipated d/c is to: SNF              Patient currently is not medically stable to d/c.   Difficult to place patient No    Discussed with daughter about pleural effusion and thoracentesis.  Patient is agreeable.  Unfortunately overall I do not think  his prognosis is good due to his combined systolic and diastolic heart failure and his chronic kidney disease.  He is at risk of acute CHF exacerbation and recurrent hospitalization.  Daughter verbalizes an understanding.    I/O last 3 completed shifts: In: 240 [P.O.:240] Out: 1826 [Urine:1825; Stool:1] Total I/O In: 360 [P.O.:360] Out: 900 [Urine:900]     Consultants:  Cardiology and nephrology  Procedures: none  Antimicrobials: none   Subjective: Sob little better but requring 2L  no cp. Localized, mild rt groin pain reproducible with palpation   Objective: Vitals:   12/05/20 1100 12/05/20 1121 12/05/20 1153 12/05/20 1209  BP: 94/61 (!) 92/49 (!) 100/48 (!) 99/55  Pulse:   63 65  Resp:    17  Temp:   97.8 F (36.6 C) 98.1 F (36.7 C)  TempSrc:      SpO2: 100% 100% 99% 98%  Weight:      Height:        Intake/Output Summary (Last 24 hours) at 12/05/2020 1322 Last data filed at 12/05/2020 1212 Gross per 24 hour  Intake 600 ml  Output 1550 ml  Net -950 ml   Filed Weights   12/02/20 0604 12/03/20 0246 12/05/20 0320  Weight: 78.6 kg 78.2 kg 78.7 kg    Examination: NAD, calm, mildly short of breath Decreased breath bases forced end expiratory mild wheezing Regular S1-S2 no gallops Soft benign positive bowel sounds No edema aaxoxo4  Data Reviewed: I have personally reviewed following labs and imaging studies  CBC: Recent Labs  Lab 11/30/20 0538 12/03/20 0403 12/04/20 0528  WBC 8.3 6.3  --   NEUTROABS 3.7  --   --   HGB 8.7* 7.8* 7.8*  HCT 26.2* 23.7* 24.2*  MCV 97.8 101.3*  --   PLT 82* 103*  --    Basic Metabolic Panel: Recent Labs  Lab 11/29/20 0556 11/30/20 0538 12/01/20 0837 12/02/20 0444 12/03/20 0403 12/04/20 0528 12/05/20 0553  NA 133* 132* 132* 130* 127* 127* 131*  K 3.9 4.1 4.1 4.6 4.8 3.8 3.3*  CL 98 96* 94* 97* 96* 93* 97*  CO2 24 23 24 22 23 24 25   GLUCOSE 87 93 116* 149* 132* 100* 90  BUN 45* 48* 50* 59* 73* 94* 94*   CREATININE 1.96* 2.01* 2.04* 2.26* 2.74* 2.79* 2.73*  CALCIUM 8.5* 9.2 9.0 8.8* 8.3* 8.5* 8.8*  MG 1.8 1.8  --   --   --   --   --    GFR: Estimated Creatinine Clearance: 21.2 mL/min (A) (by C-G formula based on SCr of 2.73 mg/dL (H)). Liver Function Tests: No results for input(s): AST, ALT, ALKPHOS, BILITOT, PROT, ALBUMIN in the last 168 hours.  No results for input(s): LIPASE, AMYLASE in the last 168 hours. No results for input(s): AMMONIA in the last 168 hours. Coagulation Profile: No results for input(s): INR, PROTIME in the last 168 hours. Cardiac Enzymes: No results for input(s): CKTOTAL, CKMB, CKMBINDEX, TROPONINI in the last 168 hours. BNP (last 3 results) No results for input(s): PROBNP in the last 8760 hours. HbA1C:  No results for input(s): HGBA1C in the last 72 hours. CBG: No results for input(s): GLUCAP in the last 168 hours.  Lipid Profile: No results for input(s): CHOL, HDL, LDLCALC, TRIG, CHOLHDL, LDLDIRECT in the last 72 hours. Thyroid Function Tests: No results for input(s): TSH, T4TOTAL, FREET4, T3FREE, THYROIDAB in the last 72 hours. Anemia Panel: No results for input(s): VITAMINB12, FOLATE, FERRITIN, TIBC, IRON, RETICCTPCT in the last 72 hours.  Sepsis Labs: No results for input(s): PROCALCITON, LATICACIDVEN in the last 168 hours.  Recent Results (from the past 240 hour(s))  Resp Panel by RT-PCR (Flu A&B, Covid) Nasopharyngeal Swab     Status: None   Collection Time: 11/26/20  4:13 PM   Specimen: Nasopharyngeal Swab; Nasopharyngeal(NP) swabs in vial transport medium  Result Value Ref Range Status   SARS Coronavirus 2 by RT PCR NEGATIVE NEGATIVE Final    Comment: (NOTE) SARS-CoV-2 target nucleic acids are NOT DETECTED.  The SARS-CoV-2 RNA is generally detectable in upper respiratory specimens during the acute phase of infection. The lowest concentration of SARS-CoV-2 viral copies this assay can detect is 138 copies/mL. A negative result does not  preclude SARS-Cov-2 infection and should not be used as the sole basis for treatment or other patient management decisions. A negative result may occur with  improper specimen collection/handling, submission of specimen other than nasopharyngeal swab, presence of viral mutation(s) within the areas targeted by this assay, and inadequate number of viral copies(<138 copies/mL). A negative result must be combined with clinical observations, patient history, and epidemiological information. The expected result is Negative.  Fact Sheet for Patients:  EntrepreneurPulse.com.au  Fact Sheet for Healthcare Providers:  IncredibleEmployment.be  This test is no t yet approved or cleared by the [place] FDA and  has been authorized for detection and/or diagnosis of SARS-CoV-2 by FDA under an Emergency Use Authorization (EUA). This EUA will remain  in effect (meaning this test can be used) for the duration of the COVID-19 declaration under Section 564(b)(1) of the Act, 21 U.S.C.section 360bbb-3(b)(1), unless the authorization is terminated  or revoked sooner.       Influenza A by PCR NEGATIVE NEGATIVE Final   Influenza B by PCR NEGATIVE NEGATIVE Final    Comment: (NOTE) The Xpert Xpress SARS-CoV-2/FLU/RSV plus assay is intended as an aid in the diagnosis of influenza from Nasopharyngeal swab specimens and should not be used as a sole basis for treatment. Nasal washings and aspirates are unacceptable for Xpert Xpress SARS-CoV-2/FLU/RSV testing.  Fact Sheet for Patients: EntrepreneurPulse.com.au  Fact Sheet for Healthcare Providers: IncredibleEmployment.be  This test is not yet approved or cleared by the [place] FDA and has been authorized for detection and/or diagnosis of SARS-CoV-2 by FDA under an Emergency Use Authorization (EUA). This EUA will remain in effect (meaning this test can be used) for the  duration of the COVID-19 declaration under Section 564(b)(1) of the Act, 21 U.S.C. section 360bbb-3(b)(1), unless the authorization is terminated or revoked.  Performed at [facility], [address], [place], [place] [address]          Radiology Studies: [facility] Chest [place] 1 View  Result Date: 12/05/2020 CLINICAL DATA:  Status post right thoracentesis. EXAM: PORTABLE CHEST 1 VIEW COMPARISON:  December 02, 2020. FINDINGS: Stable cardiomegaly. Sternotomy wires are noted. No definite pneumothorax is noted status post thoracentesis. Pleural effusion appears to be significantly smaller. IMPRESSION: No definite pneumothorax status post right thoracentesis. Electronically Signed   By: Marijo Conception M.D.   On:  12/05/2020 11:46   CT RENAL STONE STUDY  Result Date: 12/05/2020 CLINICAL DATA:  Flank pain, kidney stone suspected EXAM: CT ABDOMEN AND PELVIS WITHOUT CONTRAST TECHNIQUE: Multidetector CT imaging of the abdomen and pelvis was performed following the standard protocol without IV contrast. COMPARISON:  10/03/2020 FINDINGS: Lower chest: There is a moderate to large right pleural effusion and small left pleural effusion. Pleural thickening and calcifications overlie the posterior medial right lung. Hepatobiliary: No focal liver abnormality. Status post cholecystectomy. No bile duct dilatation. Pancreas: No pancreas inflammation, mass or main duct dilatation. Spleen: Normal appearance of the spleen. Adrenals/urinary tract: Normal appearance of the adrenal glands. 3 mm stone identified within the upper pole of the left kidney. 2 mm calcification noted within upper pole of right kidney. Bilateral kidney cysts are again noted and are incompletely characterized without IV contrast. The largest is in the lateral cortex of the upper pole of left kidney measuring 2.8 centimeters, image 98/5. No hydronephrosis identified bilaterally. No hydroureter or ureterolithiasis. Urinary bladder appears normal.  Stomach/bowel: Stomach is normal. Status post right hemicolectomy with enterocolonic anastomosis. No bowel wall thickening, inflammation, or distension. Vascular/lymphatic: Aortic atherosclerosis. No aneurysm. Calcified lymph nodes identified within the upper abdomen compatible with prior granulomatous disease. No abdominopelvic adenopathy. Reproductive: Normal. Other: Small volume of free fluid noted within the abdomen and pelvis, increased from previous exam. No discrete fluid collections. No abdominal wall hernia. Musculoskeletal: 7 millimeters anterolisthesis L4 on L5. Multilevel degenerative disc disease is identified throughout the thoracic and lumbar spine. L4-5 and L5-S1 facet arthropathy. IMPRESSION: 1. Nonobstructing small bilateral renal calculi as described above. 2. Cardiac enlargement and bilateral pleural effusions concerning for congestive heart failure. 3. Small volume of free fluid within the abdomen and pelvis, increased from previous exam. 4. Marked thoracolumbar spondylosis. Electronically Signed   By: Kerby Moors M.D.   On: 12/05/2020 06:18   US THORACENTESIS ASP PLEURAL SPACE W/IMG GUIDE  Result Date: 12/05/2020 INDICATION: Patient history of congestive heart failure, AFib, myelodysplastic syndrome currently admitted for acute exacerbation congestive heart failure and found to have bilateral pleural effusions. Request to IR for diagnostic and therapeutic thoracentesis. EXAM: ULTRASOUND GUIDED RIGHT THORACENTESIS MEDICATIONS: 7 mL% lidocaine COMPLICATIONS: None immediate. PROCEDURE: An ultrasound guided thoracentesis was thoroughly discussed with the patient and questions answered. The benefits, risks, alternatives and complications were also discussed. The patient understands and wishes to proceed with the procedure. Written consent was obtained. Ultrasound was performed to localize and mark an adequate pocket of fluid in the right chest. The area was then prepped and draped in the  normal sterile fashion. 1% Lidocaine was used for local anesthesia. Under ultrasound guidance a 6 Fr Safe-T-Centesis catheter was introduced. Thoracentesis was performed. The catheter was removed and a dressing applied. FINDINGS: A total of approximately 2.0 L of clear yellow fluid was removed. Samples were sent to the laboratory as requested by the clinical team. IMPRESSION: Successful ultrasound guided right thoracentesis yielding 2.0 L of pleural fluid. Read by Candiss Norse, PA-C Electronically Signed   By: Markus Daft M.D.   On: 12/05/2020 11:59        Scheduled Meds:  atorvastatin  40 mg Oral QHS   digoxin  0.0625 mg Oral Daily   ferrous sulfate  325 mg Oral Q M,W,F   hydrALAZINE  25 mg Oral BID   isosorbide mononitrate  30 mg Oral Daily   metolazone  2.5 mg Oral Q M,W,F   metoprolol tartrate  12.5 mg Oral BID  midodrine  10 mg Oral TID WC   sodium chloride flush  3 mL Intravenous Q12H   sucralfate  1 g Oral BID   tamsulosin  0.4 mg Oral QPC supper   torsemide  20 mg Oral Daily   Continuous Infusions:     LOS: 8 days    Time spent: 35 minutes, more than 50% time spent on direct patient care.    Nolberto Hanlon, MD [facility]   To contact the attending provider between 7A-7P or the covering provider during after hours 7P-7A, please log into the web site [email or web address] and access using universal [place] password for that web site. If you do not have the password, please call the hospital operator.  12/05/2020, 1:22 PM

## 2020-12-06 ENCOUNTER — Inpatient Hospital Stay: Payer: PPO

## 2020-12-06 ENCOUNTER — Telehealth: Payer: Self-pay | Admitting: Internal Medicine

## 2020-12-06 DIAGNOSIS — Z7189 Other specified counseling: Secondary | ICD-10-CM | POA: Diagnosis not present

## 2020-12-06 DIAGNOSIS — I2510 Atherosclerotic heart disease of native coronary artery without angina pectoris: Secondary | ICD-10-CM | POA: Diagnosis not present

## 2020-12-06 DIAGNOSIS — N179 Acute kidney failure, unspecified: Secondary | ICD-10-CM | POA: Diagnosis not present

## 2020-12-06 DIAGNOSIS — R0600 Dyspnea, unspecified: Secondary | ICD-10-CM | POA: Diagnosis not present

## 2020-12-06 DIAGNOSIS — I5043 Acute on chronic combined systolic (congestive) and diastolic (congestive) heart failure: Secondary | ICD-10-CM | POA: Diagnosis not present

## 2020-12-06 LAB — PROTEIN, BODY FLUID (OTHER): Total Protein, Body Fluid Other: 3 g/dL

## 2020-12-06 LAB — CYTOLOGY - NON PAP

## 2020-12-06 MED ORDER — MIDODRINE HCL 5 MG PO TABS: 5.0000 mg | ORAL_TABLET | Freq: Three times a day (TID) | ORAL | Status: DC

## 2020-12-06 MED ORDER — MIDODRINE HCL 5 MG PO TABS
10.0000 mg | ORAL_TABLET | Freq: Three times a day (TID) | ORAL | Status: DC
  Administered 2020-12-06 – 2020-12-07 (×3): 10 mg via ORAL
  Filled 2020-12-06 (×4): qty 2

## 2020-12-06 NOTE — Progress Notes (Signed)
[facility] Cardiology    SUBJECTIVE: Status post thoracentesis as well as diuretic therapy feels reasonably well still a little disoriented because he is not at home but is alert to questions shortness of breath is somewhat better denies fatigue that is any worse than normal no leg swelling   Vitals:   12/06/20 0454 12/06/20 0500 12/06/20 0741 12/06/20 1154  BP:  (!) 103/57 (!) 138/58 (!) 138/109  Pulse: 76  77 76  Resp: 16  16 20   Temp: 97.7 F (36.5 C)  98.6 F (37 C) 97.7 F (36.5 C)  TempSrc: Oral   Oral  SpO2: 97%  96% 99%  Weight:  76.5 kg    Height:         Intake/Output Summary (Last 24 hours) at 12/06/2020 1219 Last data filed at 12/06/2020 1152 Gross per 24 hour  Intake 380 ml  Output 2090 ml  Net -1710 ml      PHYSICAL EXAM  General: Well developed, well nourished, in no acute distress HEENT:  Normocephalic and atramatic Neck:  No JVD.  Lungs: Clear bilaterally to auscultation and percussion. Heart: HRRR . Normal S1 and S2 without gallops or murmurs.  Abdomen: Bowel sounds are positive, abdomen soft and non-tender  Msk:  Back normal, normal gait. Normal strength and tone for age. Extremities: No clubbing, cyanosis or edema.   Neuro: Alert and oriented X 3. Psych:  Good affect, responds appropriately   LABS: Basic Metabolic Panel: Recent Labs    12/04/20 0528 12/05/20 0553  NA 127* 131*  K 3.8 3.3*  CL 93* 97*  CO2 24 25  GLUCOSE 100* 90  BUN 94* 94*  CREATININE 2.79* 2.73*  CALCIUM 8.5* 8.8*   Liver Function Tests: No results for input(s): AST, ALT, ALKPHOS, BILITOT, PROT, ALBUMIN in the last 72 hours. No results for input(s): LIPASE, AMYLASE in the last 72 hours. CBC: Recent Labs    12/04/20 0528  HGB 7.8*  HCT 24.2*   Cardiac Enzymes: No results for input(s): CKTOTAL, CKMB, CKMBINDEX, TROPONINI in the last 72 hours. BNP: Invalid input(s): POCBNP D-Dimer: No results for input(s): DDIMER in the last 72 hours. Hemoglobin A1C: No results  for input(s): HGBA1C in the last 72 hours. Fasting Lipid Panel: No results for input(s): CHOL, HDL, LDLCALC, TRIG, CHOLHDL, LDLDIRECT in the last 72 hours. Thyroid Function Tests: No results for input(s): TSH, T4TOTAL, T3FREE, THYROIDAB in the last 72 hours.  Invalid input(s): FREET3 Anemia Panel: No results for input(s): VITAMINB12, FOLATE, FERRITIN, TIBC, IRON, RETICCTPCT in the last 72 hours.  DG Chest Port 1 View  Result Date: 12/05/2020 CLINICAL DATA:  Status post right thoracentesis. EXAM: PORTABLE CHEST 1 VIEW COMPARISON:  December 02, 2020. FINDINGS: Stable cardiomegaly. Sternotomy wires are noted. No definite pneumothorax is noted status post thoracentesis. Pleural effusion appears to be significantly smaller. IMPRESSION: No definite pneumothorax status post right thoracentesis. Electronically Signed   By: Marijo Conception M.D.   On: 12/05/2020 11:46   CT RENAL STONE STUDY  Result Date: 12/05/2020 CLINICAL DATA:  Flank pain, kidney stone suspected EXAM: CT ABDOMEN AND PELVIS WITHOUT CONTRAST TECHNIQUE: Multidetector CT imaging of the abdomen and pelvis was performed following the standard protocol without IV contrast. COMPARISON:  10/03/2020 FINDINGS: Lower chest: There is a moderate to large right pleural effusion and small left pleural effusion. Pleural thickening and calcifications overlie the posterior medial right lung. Hepatobiliary: No focal liver abnormality. Status post cholecystectomy. No bile duct dilatation. Pancreas: No pancreas inflammation, mass or  main duct dilatation. Spleen: Normal appearance of the spleen. Adrenals/urinary tract: Normal appearance of the adrenal glands. 3 mm stone identified within the upper pole of the left kidney. 2 mm calcification noted within upper pole of right kidney. Bilateral kidney cysts are again noted and are incompletely characterized without IV contrast. The largest is in the lateral cortex of the upper pole of left kidney measuring 2.8  centimeters, image 98/5. No hydronephrosis identified bilaterally. No hydroureter or ureterolithiasis. Urinary bladder appears normal. Stomach/bowel: Stomach is normal. Status post right hemicolectomy with enterocolonic anastomosis. No bowel wall thickening, inflammation, or distension. Vascular/lymphatic: Aortic atherosclerosis. No aneurysm. Calcified lymph nodes identified within the upper abdomen compatible with prior granulomatous disease. No abdominopelvic adenopathy. Reproductive: Normal. Other: Small volume of free fluid noted within the abdomen and pelvis, increased from previous exam. No discrete fluid collections. No abdominal wall hernia. Musculoskeletal: 7 millimeters anterolisthesis L4 on L5. Multilevel degenerative disc disease is identified throughout the thoracic and lumbar spine. L4-5 and L5-S1 facet arthropathy. IMPRESSION: 1. Nonobstructing small bilateral renal calculi as described above. 2. Cardiac enlargement and bilateral pleural effusions concerning for congestive heart failure. 3. Small volume of free fluid within the abdomen and pelvis, increased from previous exam. 4. Marked thoracolumbar spondylosis. Electronically Signed   By: Kerby Moors M.D.   On: 12/05/2020 06:18   US THORACENTESIS ASP PLEURAL SPACE W/IMG GUIDE  Result Date: 12/05/2020 INDICATION: Patient history of congestive heart failure, AFib, myelodysplastic syndrome currently admitted for acute exacerbation congestive heart failure and found to have bilateral pleural effusions. Request to IR for diagnostic and therapeutic thoracentesis. EXAM: ULTRASOUND GUIDED RIGHT THORACENTESIS MEDICATIONS: 7 mL% lidocaine COMPLICATIONS: None immediate. PROCEDURE: An ultrasound guided thoracentesis was thoroughly discussed with the patient and questions answered. The benefits, risks, alternatives and complications were also discussed. The patient understands and wishes to proceed with the procedure. Written consent was obtained.  Ultrasound was performed to localize and mark an adequate pocket of fluid in the right chest. The area was then prepped and draped in the normal sterile fashion. 1% Lidocaine was used for local anesthesia. Under ultrasound guidance a 6 Fr Safe-T-Centesis catheter was introduced. Thoracentesis was performed. The catheter was removed and a dressing applied. FINDINGS: A total of approximately 2.0 L of clear yellow fluid was removed. Samples were sent to the laboratory as requested by the clinical team. IMPRESSION: Successful ultrasound guided right thoracentesis yielding 2.0 L of pleural fluid. Read by Candiss Norse, PA-C Electronically Signed   By: Markus Daft M.D.   On: 12/05/2020 11:59     Echo depressed left ventricular function EF around 30% ROS globally  TELEMETRY: Possible atrial fibrillation interventricular conduction delay rate controlled around 60:  ASSESSMENT AND PLAN:  Principal Problem:   Acute exacerbation of CHF (congestive heart failure) (HCC) Active Problems:   CAD (coronary artery disease)   HTN (hypertension)   Chronic atrial fibrillation (HCC)   Dyspnea on exertion   BPH (benign prostatic hyperplasia)   Gastroesophageal reflux disease with esophagitis   Thrombocytopenia (HCC)   HLD (hyperlipidemia)   MDS (myelodysplastic syndrome), low grade (HCC)   Hyperkalemia   Acute renal failure superimposed on stage 3b chronic kidney disease (HCC)   Acute on chronic combined systolic (congestive) and diastolic (congestive) heart failure (HCC)   Acute hypoxemic respiratory failure (HCC)   Plan Congestive heart failure moderate to severe with systolic dysfunction continue diuretic therapy Status post thoracentesis for pleural effusion on the right Coronary disease multivessel coronary bypass surgery no recent  angina continue current medical therapy Renal insufficiency stage III agree with nephrology input and management COPD mild to moderate continue inhalers as necessary for  dyspnea shortness of breath Chronic atrial fibrillation recommend maintain anticoagulation continue rate control do not necessarily recommend cardioversion at this stage Increase activity have the patient ambulate with assistance of physical therapy Have the patient follow-up for heart failure clinic as an outpatient   Yolonda Kida, MD 12/06/2020 12:19 PM

## 2020-12-06 NOTE — Progress Notes (Signed)
[facility]  ROUNDING NOTE   Subjective:   Andres Gutierrez is a 87 y.o. male with medical problems of congestive heart failure, chronic kidney disease, atrial fibrillation, BPH, coronary disease status post CABG, cerebral amyloid angiopathy, GERD, hyperlipidemia, hypertension and myelodysplastic syndrome.  He was originally admitted for increased dyspnea on exertion, worsening edema and weight gain.  Patient seen resting in bed Completed breakfast tray at bedside Ambulated with PT yesterday, went well Denies any concerns at this time Weaned to room air   Objective:  Vital signs in last 24 hours:  Temp:  [97.6 F (36.4 C)-98.6 F (37 C)] 98.6 F (37 C) (07/26 0741) Pulse Rate:  [61-79] 77 (07/26 0741) Resp:  [16-17] 16 (07/26 0741) BP: (88-138)/(47-58) 138/58 (07/26 0741) SpO2:  [95 %-99 %] 96 % (07/26 0741) Weight:  [76.5 kg] 76.5 kg (07/26 0500)  Weight change: -2.177 kg Filed Weights   12/03/20 0246 12/05/20 0320 12/06/20 0500  Weight: 78.2 kg 78.7 kg 76.5 kg    Intake/Output: I/O last 3 completed shifts: In: 980 [P.O.:980] Out: 3400 [Urine:3400]   Intake/Output this shift:  No intake/output data recorded.  Physical Exam: General: NAD, laying in bed  Head: Normocephalic, atraumatic. Moist oral mucosal membranes  Eyes: Anicteric  Lungs:  LLL fine crackles, improved throughout  Heart: Regular rate and rhythm  Abdomen:  Soft, nontender  Extremities:  no peripheral edema.  Neurologic: Nonfocal, moving all four extremities  Skin: No lesions       Basic Metabolic Panel: Recent Labs  Lab 11/30/20 0538 12/01/20 0837 12/02/20 0444 12/03/20 0403 12/04/20 0528 12/05/20 0553  NA 132* 132* 130* 127* 127* 131*  K 4.1 4.1 4.6 4.8 3.8 3.3*  CL 96* 94* 97* 96* 93* 97*  CO2 23 24 22 23 24 25  GLUCOSE 93 116* 149* 132* 100* 90  BUN 48* 50* 59* 73* 94* 94*  CREATININE 2.01* 2.04* 2.26* 2.74* 2.79* 2.73*  CALCIUM 9.2 9.0 8.8* 8.3* 8.5* 8.8*  MG 1.8  --    --   --   --   --      Liver Function Tests: No results for input(s): AST, ALT, ALKPHOS, BILITOT, PROT, ALBUMIN in the last 168 hours.  No results for input(s): LIPASE, AMYLASE in the last 168 hours. No results for input(s): AMMONIA in the last 168 hours.  CBC: Recent Labs  Lab 11/30/20 0538 12/03/20 0403 12/04/20 0528  WBC 8.3 6.3  --   NEUTROABS 3.7  --   --   HGB 8.7* 7.8* 7.8*  HCT 26.2* 23.7* 24.2*  MCV 97.8 101.3*  --   PLT 82* 103*  --      Cardiac Enzymes: No results for input(s): CKTOTAL, CKMB, CKMBINDEX, TROPONINI in the last 168 hours.  BNP: Invalid input(s): POCBNP  CBG: No results for input(s): GLUCAP in the last 168 hours.  Microbiology: Results for orders placed or performed during the hospital encounter of 11/26/20  Resp Panel by RT-PCR (Flu A&B, Covid) Nasopharyngeal Swab     Status: None   Collection Time: 11/26/20  4:13 PM   Specimen: Nasopharyngeal Swab; Nasopharyngeal(NP) swabs in vial transport medium  Result Value Ref Range Status   SARS Coronavirus 2 by RT PCR NEGATIVE NEGATIVE Final    Comment: (NOTE) SARS-CoV-2 target nucleic acids are NOT DETECTED.  The SARS-CoV-2 RNA is generally detectable in upper respiratory specimens during the acute phase of infection. The lowest concentration of SARS-CoV-2 viral copies this assay can detect is 138   copies/mL. A negative result does not preclude SARS-Cov-2 infection and should not be used as the sole basis for treatment or other patient management decisions. A negative result may occur with  improper specimen collection/handling, submission of specimen other than nasopharyngeal swab, presence of viral mutation(s) within the areas targeted by this assay, and inadequate number of viral copies(<138 copies/mL). A negative result must be combined with clinical observations, patient history, and epidemiological information. The expected result is Negative.  Fact Sheet for Patients:   EntrepreneurPulse.com.au  Fact Sheet for Healthcare Providers:  IncredibleEmployment.be  This test is no t yet approved or cleared by the [place] FDA and  has been authorized for detection and/or diagnosis of SARS-CoV-2 by FDA under an Emergency Use Authorization (EUA). This EUA will remain  in effect (meaning this test can be used) for the duration of the COVID-19 declaration under Section 564(b)(1) of the Act, 21 U.S.C.section 360bbb-3(b)(1), unless the authorization is terminated  or revoked sooner.       Influenza A by PCR NEGATIVE NEGATIVE Final   Influenza B by PCR NEGATIVE NEGATIVE Final    Comment: (NOTE) The Xpert Xpress SARS-CoV-2/FLU/RSV plus assay is intended as an aid in the diagnosis of influenza from Nasopharyngeal swab specimens and should not be used as a sole basis for treatment. Nasal washings and aspirates are unacceptable for Xpert Xpress SARS-CoV-2/FLU/RSV testing.  Fact Sheet for Patients: EntrepreneurPulse.com.au  Fact Sheet for Healthcare Providers: IncredibleEmployment.be  This test is not yet approved or cleared by the [place] FDA and has been authorized for detection and/or diagnosis of SARS-CoV-2 by FDA under an Emergency Use Authorization (EUA). This EUA will remain in effect (meaning this test can be used) for the duration of the COVID-19 declaration under Section 564(b)(1) of the Act, 21 U.S.C. section 360bbb-3(b)(1), unless the authorization is terminated or revoked.  Performed at [facility], [address] [place], [place] [address]   Body fluid culture w Gram Stain     Status: None (Preliminary result)   Collection Time: 12/05/20 11:17 AM   Specimen: PATH Cytology Pleural fluid  Result Value Ref Range Status   Specimen Description   Final    PLEURAL Performed at [facility], [address], [place], [place] [address]    Special  Requests   Final    PLEURAL Performed at [facility], [place], [place] [address]    Gram Stain NO WBC SEEN NO ORGANISMS SEEN   Final   Culture   Final    NO GROWTH < 24 HOURS Performed at [facility], [place] [address], [place], [place] [address]    Report Status PENDING  Incomplete    Coagulation Studies: No results for input(s): LABPROT, INR in the last 72 hours.  Urinalysis: No results for input(s): COLORURINE, LABSPEC, PHURINE, GLUCOSEU, HGBUR, BILIRUBINUR, KETONESUR, PROTEINUR, UROBILINOGEN, NITRITE, LEUKOCYTESUR in the last 72 hours.  Invalid input(s): APPERANCEUR    Imaging: DG Chest Port 1 View  Result Date: 12/05/2020 CLINICAL DATA:  Status post right thoracentesis. EXAM: PORTABLE CHEST 1 VIEW COMPARISON:  December 02, 2020. FINDINGS: Stable cardiomegaly. Sternotomy wires are noted. No definite pneumothorax is noted status post thoracentesis. Pleural effusion appears to be significantly smaller. IMPRESSION: No definite pneumothorax status post right thoracentesis. Electronically Signed   By: Marijo Conception M.D.   On: 12/05/2020 11:46   CT RENAL STONE STUDY  Result Date: 12/05/2020 CLINICAL DATA:  Flank pain, kidney stone suspected EXAM: CT  ABDOMEN AND PELVIS WITHOUT CONTRAST TECHNIQUE: Multidetector CT imaging of the abdomen and pelvis was performed following the standard protocol without IV contrast. COMPARISON:  10/03/2020 FINDINGS: Lower chest: There is a moderate to large right pleural effusion and small left pleural effusion. Pleural thickening and calcifications overlie the posterior medial right lung. Hepatobiliary: No focal liver abnormality. Status post cholecystectomy. No bile duct dilatation. Pancreas: No pancreas inflammation, mass or main duct dilatation. Spleen: Normal appearance of the spleen. Adrenals/urinary tract: Normal appearance of the adrenal glands. 3 mm stone identified within the upper pole of the left kidney. 2 mm  calcification noted within upper pole of right kidney. Bilateral kidney cysts are again noted and are incompletely characterized without IV contrast. The largest is in the lateral cortex of the upper pole of left kidney measuring 2.8 centimeters, image 98/5. No hydronephrosis identified bilaterally. No hydroureter or ureterolithiasis. Urinary bladder appears normal. Stomach/bowel: Stomach is normal. Status post right hemicolectomy with enterocolonic anastomosis. No bowel wall thickening, inflammation, or distension. Vascular/lymphatic: Aortic atherosclerosis. No aneurysm. Calcified lymph nodes identified within the upper abdomen compatible with prior granulomatous disease. No abdominopelvic adenopathy. Reproductive: Normal. Other: Small volume of free fluid noted within the abdomen and pelvis, increased from previous exam. No discrete fluid collections. No abdominal wall hernia. Musculoskeletal: 7 millimeters anterolisthesis L4 on L5. Multilevel degenerative disc disease is identified throughout the thoracic and lumbar spine. L4-5 and L5-S1 facet arthropathy. IMPRESSION: 1. Nonobstructing small bilateral renal calculi as described above. 2. Cardiac enlargement and bilateral pleural effusions concerning for congestive heart failure. 3. Small volume of free fluid within the abdomen and pelvis, increased from previous exam. 4. Marked thoracolumbar spondylosis. Electronically Signed   By: Kerby Moors M.D.   On: 12/05/2020 06:18   US THORACENTESIS ASP PLEURAL SPACE W/IMG GUIDE  Result Date: 12/05/2020 INDICATION: Patient history of congestive heart failure, AFib, myelodysplastic syndrome currently admitted for acute exacerbation congestive heart failure and found to have bilateral pleural effusions. Request to IR for diagnostic and therapeutic thoracentesis. EXAM: ULTRASOUND GUIDED RIGHT THORACENTESIS MEDICATIONS: 7 mL% lidocaine COMPLICATIONS: None immediate. PROCEDURE: An ultrasound guided thoracentesis was  thoroughly discussed with the patient and questions answered. The benefits, risks, alternatives and complications were also discussed. The patient understands and wishes to proceed with the procedure. Written consent was obtained. Ultrasound was performed to localize and mark an adequate pocket of fluid in the right chest. The area was then prepped and draped in the normal sterile fashion. 1% Lidocaine was used for local anesthesia. Under ultrasound guidance a 6 Fr Safe-T-Centesis catheter was introduced. Thoracentesis was performed. The catheter was removed and a dressing applied. FINDINGS: A total of approximately 2.0 L of clear yellow fluid was removed. Samples were sent to the laboratory as requested by the clinical team. IMPRESSION: Successful ultrasound guided right thoracentesis yielding 2.0 L of pleural fluid. Read by Candiss Norse, PA-C Electronically Signed   By: Markus Daft M.D.   On: 12/05/2020 11:59     Medications:      atorvastatin  40 mg Oral QHS   digoxin  0.0625 mg Oral Daily   ferrous sulfate  325 mg Oral Q M,W,F   hydrALAZINE  25 mg Oral BID   isosorbide mononitrate  30 mg Oral Daily   metolazone  2.5 mg Oral Q M,W,F   metoprolol tartrate  12.5 mg Oral BID   midodrine  10 mg Oral TID WC   sodium chloride flush  3 mL Intravenous Q12H   sucralfate  1  g Oral BID   tamsulosin  0.4 mg Oral QPC supper   torsemide  20 mg Oral Daily   acetaminophen **OR** acetaminophen, ipratropium-albuterol, polyethylene glycol  Assessment/ Plan:  Andres Gutierrez is a 87 y.o.  male with medical problems of congestive heart failure, chronic kidney disease, atrial fibrillation, BPH, coronary disease status post CABG, cerebral amyloid angiopathy, GERD, hyperlipidemia, hypertension and myelodysplastic syndrome.  He was originally admitted for increased dyspnea on exertion, worsening edema and weight gain.  Acute exacerbation of CHF (congestive heart failure) (HCC) [I50.9] Dyspnea, unspecified  type [R06.00] Acute on chronic congestive heart failure, unspecified heart failure type ([place]) [I50.9] Acute on chronic combined systolic (congestive) and diastolic (congestive) heart failure (HCC) [I50.43]   Acute kidney disease on CKD st 3B, likely due to CHF exacerbation and diuresis  Baseline 1.6 and eGFR 41 on 08/11/20 -CKD risk factors include atherosclerosis, hypertension and advanced age - CT abd/pelvis in May shows scattered bilateral cyst, 2 mm nonobstructing stone in left kidney, no hydronephrosis.  - Will monitor am labs - Will follow up with our office at discharge   2. Volume overload and acute on chronic systolic and diastolic CHF -2D ECHO 08/28/58-YTKZ 25-30%, severely decreased function with global hypokinesis, moderate LVH and grade 2 diastolic dysfunction.  -progressive shortness of breath requiring oxygen supplementation now weaned to room air, ambulating with PT - Torsemide 34m daily and Metolazone 2.564mMWF.       LOS: 9   7/26/202211:49 AM

## 2020-12-06 NOTE — Progress Notes (Signed)
Patient seen yesterday in IR for right thoracentesis which yielded 2.0 L of pleural fluid, US examination of the left chest yesterday showed small amount of pleural fluid potentially amenable to percutaneous drainage. Request made for therapeutic left thoracentesis today.  US examination of left pleural space shows small amount of pleural fluid which appears to have decreased from yesterday's exam and is now obscured by lung with inspiration and therefore not amenable to safe percutaneous drainage. Additionally removal of this fluid is unlikely to provide any therapeutic benefit at this time. Discussed imaging with patient who agrees to foregoing procedure.  Images from today's encounter are available for review under imaging section of Epic.   Candiss Norse, PA-C

## 2020-12-06 NOTE — Progress Notes (Signed)
PROGRESS NOTE    Andres Gutierrez  OIN:[id number] DOB: 1930-10-19 DOA: 11/26/2020 PCP: Andres Aus, MD   Chief complaint.  Shortness of breath. Brief Narrative:   Andres Gutierrez is a 87 y.o. male with medical history significant of CHF, CKD 3B, A. fib, BPH, CAD status post CABG, cerebral amyloid angiopathy, GERD, hyperlipidemia, hypertension, iron deficiency anemia, myelodysplastic syndrome who presents with ongoing increased dyspnea on exertion, edema, weight gain. Echocardiogram performed in May 2022 showed ejection fraction 25 to 30%.  He is started on IV Lasix gtt.  His renal function got worse, lasix was d/c'd . Then was sob, given lasix, then renal function continued worsening. Nephrology on board. Palliative on board as overall dx is poor and high risk of readmission. D/w children. Had thoracentesis now . Monitoring renal function.     7/19.  Patient had a worsening short of breath already this morning, was placed on oxygen for hypoxemia. 7/20 BNP ELEVATED. Pt reports no sob while resting in bed. On lasix gtt.  7/21 still sob. Daughter at bedside. Spoke to pt about his code status while daughter was at bedside, after discussion, he agreed to DNR  status.  7/22- feels breathing is a little better.  7/23 had 200 cc UO today per nsg.  Denies sob /dizziness 7/24 -976 output today.   7/25- was c/o suprapubic pain. Concern for renal stone. Ct renal completed, with non obstructive stone  7/26 no abd pain. Breathing feels like its better. S/p throacentesis on 7/25.   Assessment & Plan:   Principal Problem:   Acute exacerbation of CHF (congestive heart failure) (HCC) Active Problems:   CAD (coronary artery disease)   HTN (hypertension)   Chronic atrial fibrillation (HCC)   Dyspnea on exertion   BPH (benign prostatic hyperplasia)   Gastroesophageal reflux disease with esophagitis   Thrombocytopenia (HCC)   HLD (hyperlipidemia)   MDS (myelodysplastic syndrome), low grade (HCC)    Hyperkalemia   Acute renal failure superimposed on stage 3b chronic kidney disease (HCC)   Acute on chronic combined systolic (congestive) and diastolic (congestive) heart failure ([place])   Acute hypoxemic respiratory failure ([place])  #1. Acute on chronic combined systolic and diastolic congestive heart failure. Elevated troponin secondary to congestive heart failure. Acute hypoxemic respiratory failure. Patient had a worsening short of breath earlier this morning, consistent with congestive heart failure exacerbation.  He also requiring oxygen. He had a slightly worsening renal function 2 days ago, Lasix was reduced to oral torsemide  7/20- was restarted on lasix gtt. Bnp elevated. Will d/w cards about ?  7/21 7/21 patient on 2 L nasal cannula.   7/23 bp on low side, asx. UO picked up today 7/24- feels better today. No sob, cp.reports had more UO today. 7/25 on CT scan patient found with right moderate to large pleural effusion.  Currently on 2 L O2.  Denies too much shortness of breath but not at baseline.   7/26-s/p thoracentesis, 2 L of clear yellow fluid rt side No pneumothorax postprocedure Left pleural fluid is also amicable to percutaneous drainage.  Will get Thora on the left side, will consult IR. Pt is agreeable to this. F/u pleural fluid studies Continue fluid restriction and torsemide Continue metolazone 2.5 mg Monday Wednesday Friday Palliative following see note. Avoid ACE and ARB's due to renal insufficiency Ranexa discontinued Continue midodrine for bp support    2.  Acute kidney injury on chronic kidney disease stage IIIb. Hyperkalemia: 7/26- labs pending Continue torsemide  20mg  qd and metolazone Yesterday renal function was not worse UO  better Nephrology following, will need to follow-up with nephrology as outpatient    3. Hyponatremia Asymptomatic, improving Continue fluid restriction and torsemide current dose   4.  Coronary disease with status post  CABG. Continue current cardiac meds  5.  Anemia. Thrombocytopenia. Myelodysplastic syndrome. H&H stable.  Platelets stable.  Monitor periodically   6.afib -on beta blk and digoxin. Rate controlled.  Not on a/c    DVT prophylaxis: SCDs Code Status: DNR.   Family Communication: daughter updated via phone Disposition Plan:    Status is: Inpatient  Remains inpatient appropriate because:IV treatments appropriate due to intensity of illness or inability to take PO and Inpatient level of care appropriate due to severity of illness  Dispo: The patient is from: Home              Anticipated d/c is to: SNF              Patient currently is not medically stable to d/c.   Difficult to place patient No        I/O last 3 completed shifts: In: 53 [P.O.:980] Out: 3400 [Urine:3400] Total I/O In: -  Out: 72 [Urine:240]     Consultants:  Cardiology and nephrology  Procedures: none  Antimicrobials: none   Subjective: Feels tap helped with his breathing. No other complaints today. Has not ambulated to see how his breathing is with ambulation   Objective: Vitals:   12/06/20 0454 12/06/20 0500 12/06/20 0741 12/06/20 1154  BP:  (!) 103/57 (!) 138/58 (!) 138/109  Pulse: 76  77 76  Resp: 16  16 20   Temp: 97.7 F (36.5 C)  98.6 F (37 C) 97.7 F (36.5 C)  TempSrc: Oral   Oral  SpO2: 97%  96% 99%  Weight:  76.5 kg    Height:        Intake/Output Summary (Last 24 hours) at 12/06/2020 1216 Last data filed at 12/06/2020 1152 Gross per 24 hour  Intake 380 ml  Output 2090 ml  Net -1710 ml   Filed Weights   12/03/20 0246 12/05/20 0320 12/06/20 0500  Weight: 78.2 kg 78.7 kg 76.5 kg    Examination: Nad, calm Decrease bs L>R no wheezing Reg, s1/s2 no gallop Soft benign +bs No edema Aaxox4 grossly intact  Data Reviewed: I have personally reviewed following labs and imaging studies  CBC: Recent Labs  Lab 11/30/20 0538 12/03/20 0403 12/04/20 0528  WBC 8.3 6.3   --   NEUTROABS 3.7  --   --   HGB 8.7* 7.8* 7.8*  HCT 26.2* 23.7* 24.2*  MCV 97.8 101.3*  --   PLT 82* 103*  --    Basic Metabolic Panel: Recent Labs  Lab 11/30/20 0538 12/01/20 0837 12/02/20 0444 12/03/20 0403 12/04/20 0528 12/05/20 0553  NA 132* 132* 130* 127* 127* 131*  K 4.1 4.1 4.6 4.8 3.8 3.3*  CL 96* 94* 97* 96* 93* 97*  CO2 23 24 22 23 24 25   GLUCOSE 93 116* 149* 132* 100* 90  BUN 48* 50* 59* 73* 94* 94*  CREATININE 2.01* 2.04* 2.26* 2.74* 2.79* 2.73*  CALCIUM 9.2 9.0 8.8* 8.3* 8.5* 8.8*  MG 1.8  --   --   --   --   --    GFR: Estimated Creatinine Clearance: 20.6 mL/min (A) (by C-G formula based on SCr of 2.73 mg/dL (H)). Liver Function Tests: No results for input(s): AST, ALT, ALKPHOS,  BILITOT, PROT, ALBUMIN in the last 168 hours.  No results for input(s): LIPASE, AMYLASE in the last 168 hours. No results for input(s): AMMONIA in the last 168 hours. Coagulation Profile: No results for input(s): INR, PROTIME in the last 168 hours. Cardiac Enzymes: No results for input(s): CKTOTAL, CKMB, CKMBINDEX, TROPONINI in the last 168 hours. BNP (last 3 results) No results for input(s): PROBNP in the last 8760 hours. HbA1C: No results for input(s): HGBA1C in the last 72 hours. CBG: No results for input(s): GLUCAP in the last 168 hours.  Lipid Profile: No results for input(s): CHOL, HDL, LDLCALC, TRIG, CHOLHDL, LDLDIRECT in the last 72 hours. Thyroid Function Tests: No results for input(s): TSH, T4TOTAL, FREET4, T3FREE, THYROIDAB in the last 72 hours. Anemia Panel: No results for input(s): VITAMINB12, FOLATE, FERRITIN, TIBC, IRON, RETICCTPCT in the last 72 hours.  Sepsis Labs: No results for input(s): PROCALCITON, LATICACIDVEN in the last 168 hours.  Recent Results (from the past 240 hour(s))  Resp Panel by RT-PCR (Flu A&B, Covid) Nasopharyngeal Swab     Status: None   Collection Time: 11/26/20  4:13 PM   Specimen: Nasopharyngeal Swab; Nasopharyngeal(NP) swabs in  vial transport medium  Result Value Ref Range Status   SARS Coronavirus 2 by RT PCR NEGATIVE NEGATIVE Final    Comment: (NOTE) SARS-CoV-2 target nucleic acids are NOT DETECTED.  The SARS-CoV-2 RNA is generally detectable in upper respiratory specimens during the acute phase of infection. The lowest concentration of SARS-CoV-2 viral copies this assay can detect is 138 copies/mL. A negative result does not preclude SARS-Cov-2 infection and should not be used as the sole basis for treatment or other patient management decisions. A negative result may occur with  improper specimen collection/handling, submission of specimen other than nasopharyngeal swab, presence of viral mutation(s) within the areas targeted by this assay, and inadequate number of viral copies(<138 copies/mL). A negative result must be combined with clinical observations, patient history, and epidemiological information. The expected result is Negative.  Fact Sheet for Patients:  EntrepreneurPulse.com.au  Fact Sheet for Healthcare Providers:  IncredibleEmployment.be  This test is no t yet approved or cleared by the [place] FDA and  has been authorized for detection and/or diagnosis of SARS-CoV-2 by FDA under an Emergency Use Authorization (EUA). This EUA will remain  in effect (meaning this test can be used) for the duration of the COVID-19 declaration under Section 564(b)(1) of the Act, 21 U.S.C.section 360bbb-3(b)(1), unless the authorization is terminated  or revoked sooner.       Influenza A by PCR NEGATIVE NEGATIVE Final   Influenza B by PCR NEGATIVE NEGATIVE Final    Comment: (NOTE) The Xpert Xpress SARS-CoV-2/FLU/RSV plus assay is intended as an aid in the diagnosis of influenza from Nasopharyngeal swab specimens and should not be used as a sole basis for treatment. Nasal washings and aspirates are unacceptable for Xpert Xpress SARS-CoV-2/FLU/RSV testing.  Fact  Sheet for Patients: EntrepreneurPulse.com.au  Fact Sheet for Healthcare Providers: IncredibleEmployment.be  This test is not yet approved or cleared by the [place] FDA and has been authorized for detection and/or diagnosis of SARS-CoV-2 by FDA under an Emergency Use Authorization (EUA). This EUA will remain in effect (meaning this test can be used) for the duration of the COVID-19 declaration under Section 564(b)(1) of the Act, 21 U.S.C. section 360bbb-3(b)(1), unless the authorization is terminated or revoked.  Performed at [facility], [address] [place], [place] [address]   Body fluid culture w Gram  Stain     Status: None (Preliminary result)   Collection Time: 12/05/20 11:17 AM   Specimen: PATH Cytology Pleural fluid  Result Value Ref Range Status   Specimen Description   Final    PLEURAL Performed at [facility], [address] [place], [place] [address]    Special Requests   Final    PLEURAL Performed at [facility], [place], [place] [address]    Gram Stain NO WBC SEEN NO ORGANISMS SEEN   Final   Culture   Final    NO GROWTH < 24 HOURS Performed at [facility], [place] [address] [place], [place] [address]    Report Status PENDING  Incomplete         Radiology Studies: DG Chest Port 1 View  Result Date: 12/05/2020 CLINICAL DATA:  Status post right thoracentesis. EXAM: PORTABLE CHEST 1 VIEW COMPARISON:  December 02, 2020. FINDINGS: Stable cardiomegaly. Sternotomy wires are noted. No definite pneumothorax is noted status post thoracentesis. Pleural effusion appears to be significantly smaller. IMPRESSION: No definite pneumothorax status post right thoracentesis. Electronically Signed   By: Marijo Conception M.D.   On: 12/05/2020 11:46   CT RENAL STONE STUDY  Result Date: 12/05/2020 CLINICAL DATA:  Flank pain, kidney stone suspected EXAM: CT ABDOMEN AND PELVIS WITHOUT  CONTRAST TECHNIQUE: Multidetector CT imaging of the abdomen and pelvis was performed following the standard protocol without IV contrast. COMPARISON:  10/03/2020 FINDINGS: Lower chest: There is a moderate to large right pleural effusion and small left pleural effusion. Pleural thickening and calcifications overlie the posterior medial right lung. Hepatobiliary: No focal liver abnormality. Status post cholecystectomy. No bile duct dilatation. Pancreas: No pancreas inflammation, mass or main duct dilatation. Spleen: Normal appearance of the spleen. Adrenals/urinary tract: Normal appearance of the adrenal glands. 3 mm stone identified within the upper pole of the left kidney. 2 mm calcification noted within upper pole of right kidney. Bilateral kidney cysts are again noted and are incompletely characterized without IV contrast. The largest is in the lateral cortex of the upper pole of left kidney measuring 2.8 centimeters, image 98/5. No hydronephrosis identified bilaterally. No hydroureter or ureterolithiasis. Urinary bladder appears normal. Stomach/bowel: Stomach is normal. Status post right hemicolectomy with enterocolonic anastomosis. No bowel wall thickening, inflammation, or distension. Vascular/lymphatic: Aortic atherosclerosis. No aneurysm. Calcified lymph nodes identified within the upper abdomen compatible with prior granulomatous disease. No abdominopelvic adenopathy. Reproductive: Normal. Other: Small volume of free fluid noted within the abdomen and pelvis, increased from previous exam. No discrete fluid collections. No abdominal wall hernia. Musculoskeletal: 7 millimeters anterolisthesis L4 on L5. Multilevel degenerative disc disease is identified throughout the thoracic and lumbar spine. L4-5 and L5-S1 facet arthropathy. IMPRESSION: 1. Nonobstructing small bilateral renal calculi as described above. 2. Cardiac enlargement and bilateral pleural effusions concerning for congestive heart failure. 3. Small  volume of free fluid within the abdomen and pelvis, increased from previous exam. 4. Marked thoracolumbar spondylosis. Electronically Signed   By: Kerby Moors M.D.   On: 12/05/2020 06:18   US THORACENTESIS ASP PLEURAL SPACE W/IMG GUIDE  Result Date: 12/05/2020 INDICATION: Patient history of congestive heart failure, AFib, myelodysplastic syndrome currently admitted for acute exacerbation congestive heart failure and found to have bilateral pleural effusions. Request to IR for diagnostic and therapeutic thoracentesis. EXAM: ULTRASOUND GUIDED RIGHT THORACENTESIS MEDICATIONS: 7 mL% lidocaine COMPLICATIONS: None immediate. PROCEDURE: An ultrasound guided thoracentesis was thoroughly discussed with the patient and questions answered. The benefits,  risks, alternatives and complications were also discussed. The patient understands and wishes to proceed with the procedure. Written consent was obtained. Ultrasound was performed to localize and mark an adequate pocket of fluid in the right chest. The area was then prepped and draped in the normal sterile fashion. 1% Lidocaine was used for local anesthesia. Under ultrasound guidance a 6 Fr Safe-T-Centesis catheter was introduced. Thoracentesis was performed. The catheter was removed and a dressing applied. FINDINGS: A total of approximately 2.0 L of clear yellow fluid was removed. Samples were sent to the laboratory as requested by the clinical team. IMPRESSION: Successful ultrasound guided right thoracentesis yielding 2.0 L of pleural fluid. Read by Candiss Norse, PA-C Electronically Signed   By: Markus Daft M.D.   On: 12/05/2020 11:59        Scheduled Meds:  atorvastatin  40 mg Oral QHS   digoxin  0.0625 mg Oral Daily   ferrous sulfate  325 mg Oral Q M,W,F   hydrALAZINE  25 mg Oral BID   isosorbide mononitrate  30 mg Oral Daily   metolazone  2.5 mg Oral Q M,W,F   metoprolol tartrate  12.5 mg Oral BID   midodrine  10 mg Oral TID WC   sodium chloride  flush  3 mL Intravenous Q12H   sucralfate  1 g Oral BID   tamsulosin  0.4 mg Oral QPC supper   torsemide  20 mg Oral Daily   Continuous Infusions:     LOS: 9 days    Time spent: 35 minutes, more than 50% time spent on direct patient care.    Andres Hanlon, MD [facility]   To contact the attending provider between 7A-7P or the covering provider during after hours 7P-7A, please log into the web site [email or web address] and access using universal [place] password for that web site. If you do not have the password, please call the hospital operator.  12/06/2020, 12:16 PM

## 2020-12-06 NOTE — Plan of Care (Signed)

## 2020-12-06 NOTE — Telephone Encounter (Signed)
Patients daughter called to cancel his lab/aranesp appointment as he is currently admitted.  She will call to reschedule once he is discharged.

## 2020-12-06 NOTE — Progress Notes (Signed)
Daily Progress Note   Patient Name: Andres Gutierrez       Date: 12/06/2020 DOB: 09/04/1930  Age: 87 y.o. MRN#: [id number] Attending Physician: Nolberto Hanlon, MD Primary Care Physician: Rusty Aus, MD Admit Date: 11/26/2020  Reason for Consultation/Follow-up: Establishing goals of care  Subjective: Patient is resting in bed. He states he feels much better than he did last week. He no longer has SOB or wheezing. Discussed the thoracentesis yesterday. He is hopeful his status can be managed between cardiology and nephrology, with medication from here forward, and without the need for additional thoracentesis.   Length of Stay: 9  Current Medications: Scheduled Meds:   atorvastatin  40 mg Oral QHS   digoxin  0.0625 mg Oral Daily   ferrous sulfate  325 mg Oral Q M,W,F   hydrALAZINE  25 mg Oral BID   isosorbide mononitrate  30 mg Oral Daily   metolazone  2.5 mg Oral Q M,W,F   metoprolol tartrate  12.5 mg Oral BID   midodrine  10 mg Oral TID WC   sodium chloride flush  3 mL Intravenous Q12H   sucralfate  1 g Oral BID   tamsulosin  0.4 mg Oral QPC supper   torsemide  20 mg Oral Daily    Continuous Infusions:   PRN Meds: acetaminophen **OR** acetaminophen, ipratropium-albuterol, polyethylene glycol  Physical Exam Pulmonary:     Effort: Pulmonary effort is normal.  Neurological:     Mental Status: He is alert.            Vital Signs: BP (!) 138/58 (BP Location: Right Arm)   Pulse 77   Temp 98.6 F (37 C)   Resp 16   Ht 6\' 4"  (1.93 m)   Wt 76.5 kg   SpO2 96%   BMI 20.53 kg/m  SpO2: SpO2: 96 % O2 Device: O2 Device: Room Air O2 Flow Rate: O2 Flow Rate (L/min): 2 L/min  Intake/output summary:  Intake/Output Summary (Last 24 hours) at 12/06/2020 1018 Last data filed at  12/06/2020 0500 Gross per 24 hour  Intake 620 ml  Output 2050 ml  Net -1430 ml   LBM: Last BM Date: 12/04/20 Baseline Weight: Weight: 83.9 kg Most recent weight: Weight: 76.5 kg         Patient Active Problem List  Diagnosis Date Noted   Acute hypoxemic respiratory failure ([place]) 11/29/2020   Acute on chronic combined systolic (congestive) and diastolic (congestive) heart failure ([place]) 11/27/2020   Acute exacerbation of CHF (congestive heart failure) ([place]) 11/26/2020   Hyperkalemia 11/26/2020   Acute renal failure superimposed on stage 3b chronic kidney disease ([place]) 11/26/2020   Hypomagnesemia 10/03/2020   Pleural effusion 10/03/2020   Chest pain 10/03/2020   GI bleed 08/12/2020   MDS (myelodysplastic syndrome), low grade ([place]) 07/22/2020   History of 2019 novel coronavirus disease (COVID-19) 05/19/2020   Cerebral amyloid angiopathy ([place]) 03/05/2020   Abdominal pain 03/03/2020   HLD (hyperlipidemia) 03/03/2020   Blurry vision 03/03/2020   CKD (chronic kidney disease), stage IIIb 03/03/2020   Left ureteral stone 03/03/2020   Generalized weakness    Cerebral hemorrhage, nontraumatic ([place]) 03/02/2020   Chronic cholecystitis 11/06/2019   Malnutrition of moderate degree 07/30/2019   Acute neutrophilia 07/30/2019   Sepsis ([place]) 07/29/2019   Acute cholecystitis 07/29/2019   Foot infection    Weakness    NSTEMI (non-ST elevated myocardial infarction) ([place])    Orthostatic hypotension 01/07/2019   Symptomatic anemia 08/05/2018   Anemia of chronic kidney failure, stage 3 (moderate) ([place]) 08/05/2018   BPH (benign prostatic hyperplasia) 07/24/2018   Gastroesophageal reflux disease with esophagitis 07/24/2018   Thrombocytopenia ([place]) 07/24/2018   Primary osteoarthritis of both knees 08/22/2017   Medicare annual wellness visit, initial 27/25/3664   Chronic systolic CHF (congestive heart failure), NYHA class 3 ([place]) 11/27/2016   Chewing tobacco use 01/17/2016   Dyspnea on exertion  12/23/2015   Elevated troponin 12/23/2015   Iron deficiency anemia due to chronic blood loss 12/20/2015   Femoral neck fracture, left, closed, initial encounter 11/03/2015   Chronic atrial fibrillation (HCC)    Coronary artery disease involving native coronary artery of native heart without angina pectoris    B12 deficiency 08/03/2015   Erosive esophagitis 08/03/2015   Adenopathy    CAD (coronary artery disease) 12/22/2014   HTN (hypertension) 12/22/2014   Cardiomyopathy, ischemic 07/27/2014    Palliative Care Assessment & Plan    Recommendations/Plan: Continue current care.  Recommend outpatient palliative.     Code Status:    Code Status Orders  (From admission, onward)           Start     Ordered   12/01/20 1009  Do not attempt resuscitation (DNR)  Continuous       Question Answer Comment  In the event of cardiac or respiratory ARREST Do not call a "code blue"   In the event of cardiac or respiratory ARREST Do not perform Intubation, CPR, defibrillation or ACLS   In the event of cardiac or respiratory ARREST Use medication by any route, position, wound care, and other measures to relive pain and suffering. May use oxygen, suction and manual treatment of airway obstruction as needed for comfort.   Comments Daughter as witness at bedside      12/01/20 1008           Code Status History     Date Active Date Inactive Code Status Order ID Comments User Context   11/26/2020 1902 12/01/2020 1008 Full Code [id number]  Marcelyn Bruins, MD ED   10/03/2020 1513 10/04/2020 2203 Full Code [id number]  Ivor Costa, MD ED   08/12/2020 2203 08/17/2020 1821 Full Code [id number]  Clarnce Flock, MD Inpatient   03/03/2020 1649 03/04/2020 2157 Full Code [id number]  Ivor Costa, MD ED   02/26/2020  1412 02/27/2020 1834 Full Code [id number]  Collier Bullock, MD ED   11/06/2019 1201 11/07/2019 1749 Full Code [id number]  Benjamine Sprague, DO Inpatient   07/29/2019 2258 08/02/2019 1929 Full Code  [id number]  Jennye Boroughs, MD Inpatient   06/07/2019 1643 06/09/2019 1852 Full Code [id number]  Lorella Nimrod, MD ED   01/07/2019 1514 01/09/2019 1907 Full Code [id number]  Mayo, Pete Pelt, MD ED   01/24/2016 1704 01/26/2016 2045 Full Code [id number]  Domenic Polite, MD Inpatient   12/26/2015 1317 01/02/2016 1650 Full Code [id number]  Bettey Costa, MD ED   12/23/2015 2246 12/24/2015 0835 Full Code [id number]  Lance Coon, MD Inpatient   11/03/2015 1758 11/05/2015 1905 Full Code [id number]  Hower, Aaron Mose, MD ED   10/29/2015 0108 11/01/2015 1605 Full Code [id number]  Alesia Richards, MD ED   12/22/2014 0223 12/24/2014 1413 Full Code [id number]  Juluis Mire, MD Inpatient      Advance Directive Documentation    Flowsheet Row Most Recent Value  Type of Advance Directive Healthcare Power of [place], [place] is POA]  Pre-existing out of facility DNR order (yellow form or pink MOST form) --  "MOST" Form in Place? --         Thank you for allowing the Palliative Medicine Team to assist in the care of this patient.       Total Time 15 min Prolonged Time Billed  no       Greater than 50%  of this time was spent counseling and coordinating care related to the above assessment and plan.  Asencion Gowda, NP  Please contact Palliative Medicine Team phone at [phone] for questions and concerns.

## 2020-12-07 DIAGNOSIS — I482 Chronic atrial fibrillation, unspecified: Secondary | ICD-10-CM | POA: Diagnosis not present

## 2020-12-07 DIAGNOSIS — N179 Acute kidney failure, unspecified: Secondary | ICD-10-CM | POA: Diagnosis not present

## 2020-12-07 DIAGNOSIS — J9601 Acute respiratory failure with hypoxia: Secondary | ICD-10-CM | POA: Diagnosis not present

## 2020-12-07 DIAGNOSIS — I5043 Acute on chronic combined systolic (congestive) and diastolic (congestive) heart failure: Secondary | ICD-10-CM | POA: Diagnosis not present

## 2020-12-07 LAB — BASIC METABOLIC PANEL
Anion gap: 13 (ref 5–15)
BUN: 94 mg/dL — ABNORMAL HIGH (ref 8–23)
CO2: 27 mmol/L (ref 22–32)
Calcium: 9.1 mg/dL (ref 8.9–10.3)
Chloride: 94 mmol/L — ABNORMAL LOW (ref 98–111)
Creatinine, Ser: 2.38 mg/dL — ABNORMAL HIGH (ref 0.61–1.24)
GFR, Estimated: 26 mL/min — ABNORMAL LOW (ref >60)
Glucose, Bld: 96 mg/dL (ref 70–99)
Potassium: 3.6 mmol/L (ref 3.5–5.1)
Sodium: 134 mmol/L — ABNORMAL LOW (ref 135–145)

## 2020-12-07 LAB — DIGOXIN LEVEL: Digoxin Level: 1.1 ng/mL (ref 0.8–2.0)

## 2020-12-07 LAB — BRAIN NATRIURETIC PEPTIDE: B Natriuretic Peptide: 1645.8 pg/mL — ABNORMAL HIGH (ref 0.0–100.0)

## 2020-12-07 LAB — CHOLESTEROL, BODY FLUID: Cholesterol, Fluid: 17 mg/dL

## 2020-12-07 MED ORDER — MIDODRINE HCL 10 MG PO TABS: 10.0000 mg | ORAL_TABLET | Freq: Three times a day (TID) | ORAL | 0 refills | Status: DC

## 2020-12-07 MED ORDER — CARVEDILOL 3.125 MG PO TABS
3.1250 mg | ORAL_TABLET | Freq: Two times a day (BID) | ORAL | 1 refills | Status: DC
Start: 2020-12-07 — End: 2021-01-01

## 2020-12-07 MED ORDER — CARVEDILOL 3.125 MG PO TABS
3.1250 mg | ORAL_TABLET | Freq: Two times a day (BID) | ORAL | Status: DC
  Administered 2020-12-07: 3.125 mg via ORAL
  Filled 2020-12-07: qty 1

## 2020-12-07 MED ORDER — TORSEMIDE 20 MG PO TABS: 20.0000 mg | ORAL_TABLET | Freq: Every day | ORAL | 1 refills | Status: AC

## 2020-12-07 NOTE — Progress Notes (Signed)
Occupational Therapy Treatment Patient Details Name: Andres Gutierrez MRN: [id number] DOB: June 25, 1930 Today's Date: 12/07/2020    History of present illness Andres Gutierrez is a 87 y.o. male with medical history significant of CHF, CKD 3B, A. fib, BPH, CAD status post CABG, cerebral amyloid angiopathy, GERD, hyperlipidemia, hypertension, iron deficiency anemia, myelodysplastic syndrome who presents with ongoing increased dyspnea on exertion, edema, weight gain. Pt is being treated for acute on chronic combined systolic & diastolic congestive heart failure.   OT comments  Mr. Penrod engaged in a range of fxl mobility tasks this AM, able to accomplish all with Mod I. He completed bathing and grooming in sitting, UB and LB dressing (including donning underwear, socks, shoes), 3 sets x UB therex with theraband w/ 10 reps per set, incentive spirometer, bed mobility, transfers, w/in room ambulation. Mr. Schnick displayed good stamina, balance, and safety awareness. Have upgraded DC recs to Susan Moore.    Follow Up Recommendations  Home health OT    Equipment Recommendations  None recommended by OT    Recommendations for Other Services      Precautions / Restrictions Precautions Precautions: Fall Precaution Comments: pt with LLE surgical shoe (per daughter podiatrist wants him to wear that shoe, he visits podiatrists every 2-4 weeks for tx of calluses) Restrictions Weight Bearing Restrictions: No       Mobility Bed Mobility Overal bed mobility: Modified Independent Bed Mobility: Supine to Sit       Sit to supine: Modified independent (Device/Increase time)   General bed mobility comments: Some extra time required, no physical assistance    Transfers Overall transfer level: Modified independent Equipment used: Rolling walker (2 wheeled) Transfers: Sit to/from [facility] Sit to Stand: Modified independent (Device/Increase time) Stand pivot transfers: Modified independent  (Device/Increase time)       General transfer comment: good safety awareness    Balance Overall balance assessment: Needs assistance Sitting-balance support: Feet supported;Bilateral upper extremity supported Sitting balance-[name]: Good     Standing balance support: During functional activity;Bilateral upper extremity supported;No upper extremity supported Standing balance-[name]: Good Standing balance comment: Able to stand w/o RW for LB dressing, no balance concerns                           ADL either performed or assessed with clinical judgement   ADL Overall ADL's : Needs assistance/impaired     Grooming: Sitting;Modified independent;Wash/dry hands;Wash/dry face   Upper Body Bathing: Modified independent;Sitting       Upper Body Dressing : Modified independent;Sitting   Lower Body Dressing: Modified independent;Sit to/from stand               Functional mobility during ADLs: Modified independent;Rolling walker       Vision Patient Visual Report: No change from baseline     Perception     Praxis      Cognition Arousal/Alertness: Awake/alert Behavior During Therapy: WFL for tasks assessed/performed Overall Cognitive Status: Within Functional Limits for tasks assessed                                 General Comments: very pleasant and cooperative        Exercises Other Exercises Other Exercises: Upper body therex with theraband, Incentive spirometer   Shoulder Instructions       General Comments      Pertinent Vitals/ Pain  Pain Assessment: No/denies pain  Home Living                                          Prior Functioning/Environment              Frequency  Min 1X/week        Progress Toward Goals  OT Goals(current goals can now be found in the care plan section)  Progress towards OT goals: Progressing toward goals  Acute Rehab OT Goals Patient Stated Goal: to  go home and see my grandchildren and great-grandchildren OT Goal Formulation: With patient Time For Goal Achievement: 12/12/20 Potential to Achieve Goals: Good  Plan Discharge plan needs to be updated;Frequency needs to be updated    Co-evaluation                 AM-PAC OT "6 Clicks" Daily Activity     Outcome Measure   Help from another person eating meals?: None Help from another person taking care of personal grooming?: None Help from another person toileting, which includes using toliet, bedpan, or urinal?: A Little Help from another person bathing (including washing, rinsing, drying)?: A Little Help from another person to put on and taking off regular upper body clothing?: None Help from another person to put on and taking off regular lower body clothing?: None 6 Click Score: 22    End of Session Equipment Utilized During Treatment: Rolling walker  OT Visit Diagnosis: Unsteadiness on feet (R26.81);Muscle weakness (generalized) (M62.81)   Activity Tolerance Patient tolerated treatment well   Patient Left in chair;with call bell/phone within reach   Nurse Communication          Time: 8115-7262 OT Time Calculation (min): 24 min  Charges: OT General Charges $OT Visit: 1 Visit OT Treatments $Self Care/Home Management : 8-22 mins $Therapeutic Exercise: 8-22 mins  Josiah Lobo, PhD, MS, OTR/L 12/07/20, 10:55 AM

## 2020-12-07 NOTE — Progress Notes (Addendum)
   Heart Failure Nurse Navigator Note  I wanted to touch base with patient before he was discharged home.  He states that he has weighed himself daily for the last 5 years.  He does it first thing in the morning when he gets up after going to the bathroom and  with only his underwear on.  Discussed diet, states that his daughter [name] brings him a sausage biscuit approximately 3 times a week, the other days of the weeks has cereal.  For the other meals he eats sandwiches and occasionally soups.  Discussed that canned soups are high in sodium, he will sometimes eat the whole can, recommend if he wants  to have  soup to do low sodium soups.  He voices understanding.  He says that [name] lives 1 block from him and he makes up the grocery list and she does the shopping.  He states he is ready to go home and feels the best he has in two months.  Pricilla Riffle RN [facility]

## 2020-12-07 NOTE — Discharge Summary (Signed)
[facility] NAME: Andres Gutierrez    MR#:  [id number]  DATE OF BIRTH:  01/24/1931  DATE OF ADMISSION:  11/26/2020 ADMITTING PHYSICIAN: Andres Hones, MD  DATE OF DISCHARGE: 12/07/2020  PRIMARY CARE PHYSICIAN: Andres Aus, MD    ADMISSION DIAGNOSIS:  Acute exacerbation of CHF (congestive heart failure) (HCC) [I50.9] Dyspnea, unspecified type [R06.00] Acute on chronic congestive heart failure, unspecified heart failure type ([place]) [I50.9] Acute on chronic combined systolic (congestive) and diastolic (congestive) heart failure ([place]) [I50.43]  DISCHARGE DIAGNOSIS:  acute on chronic congestive heart failure systolic right-sided pleural effusion status post thoracentesis on 12/05/2020  SECONDARY DIAGNOSIS:   Past Medical History:  Diagnosis Date   Acalculous cholecystitis 10/29/2015   Acute on chronic heart failure ([place]) 06/07/2019   Anemia    Anginal pain (HCC)    Anxiety    Atrial fibrillation with RVR ([place]) 12/23/2015   Cardiomyopathy (HCC)    Cerebral amyloid angiopathy (HCC)    CHF (congestive heart failure) (HCC)    Chronic kidney disease    kidney stones   Coronary artery disease    Cough    Dyspnea    GERD (gastroesophageal reflux disease)    GERD (gastroesophageal reflux disease) 12/23/2015   History of kidney stones    Hypertension    Lymphadenopathy, hilar    MDS (myelodysplastic syndrome) (HCC)    MI (myocardial infarction) ([place])    x 2   Wheezing     HOSPITAL COURSE:  Andres Gutierrez is a 87 y.o. male with medical history significant of CHF, CKD 3B, A. fib, BPH, CAD status post CABG, cerebral amyloid angiopathy, GERD, hyperlipidemia, hypertension, iron deficiency anemia, myelodysplastic syndrome who presents with ongoing increased dyspnea on exertion, edema, weight gain. Echocardiogram performed in May 2022 showed ejection fraction 25 to 30%.  Acute on chronic combined systolic diastolic congestive heart  failure elevated troponin suspected secondary to congestive heart failure/demand ischemia acute hypoxic respiratory failure improved -- continue torsemide -- patient was placed on IV Lasix drip creatinine trending up discontinued now tolerating torsemide well. I have discontinued zaroxylyn for now -- good urine output -- sats more than 92% on room air -- patient overall feels a lot better avoid Ace and ARB's due to renal insufficiency -- patient will follow-up with Andres Gutierrez as outpatient  right pleural effusion secondary to CHF -- status post thoracentesis with 2 L fluid removal -- post thoracentesis patient feels a lot better. Chest x-ray looks stable  acute on chronic kidney disease stage IIIB Hyperkalemia-- resolved -- continue current dose of torsemide 20 mg daily -- happening stable at 2.3 -- patient will follow-up with Dr. Zollie Gutierrez as outpatient  coronary artery disease with history of CABG -- continue current cardiac meds per Andres Gutierrez  chronic anemia/thrombocytopenia/MDS == hemoglobin stable -- platelet stable  a fib chronic -- on Coreg and digoxin -- rate controlled -- not on anticoagulation    patient overall appears near baseline. Will discharge patient to home with home health PT and RN. Discussed with daughter [name] on the phone. She was asking about rehab however PT still recommends home health and patient/family unable to afford private pay for rehab.  seen by palliative care-- patient is DNR. Goals of care discuss with patient and daughter  patient is at a high risk for readmission given multiple comorbidities.  CONSULTS OBTAINED:  Treatment Team:  Andres Kida, MD  DRUG ALLERGIES:   Allergies  Allergen Reactions  Levaquin [Levofloxacin] Other (See Comments)    [name], her daughter, said Levaquin should be avoided because when patient had Levaquin a few years ago, he had "a bad reaction".  She said he could not walk and could not lift up his  feet.   Escitalopram Other (See Comments)    Reaction:  Makes him feel faint, like he was going to have a heart attack.   5ht3 Receptor Antagonists Other (See Comments)   Diltiazem Rash   Diphenhydramine Hcl Other (See Comments)    Reaction:  Rash and fever a long time ago, but has had it since with no problem.   Maxidex [Dexamethasone] Other (See Comments)    Reaction:  Unknown    Prednisone Other (See Comments)    Pt states that this medication made him feel crazy.     Serotonin Other (See Comments)    Tried 2 different types, lexapro and another one.  Reaction:  Made him feel like he was having a heart attack.    Vytorin [Ezetimibe-Simvastatin] Other (See Comments)    Reaction:  Unknown    Zocor [Simvastatin] Other (See Comments)    Reaction:  Unknown     DISCHARGE MEDICATIONS:   Allergies as of 12/07/2020       Reactions   Levaquin [levofloxacin] Other (See Comments)   [name], her daughter, said Levaquin should be avoided because when patient had Levaquin a few years ago, he had "a bad reaction".  She said he could not walk and could not lift up his feet.   Escitalopram Other (See Comments)   Reaction:  Makes him feel faint, like he was going to have a heart attack.   5ht3 Receptor Antagonists Other (See Comments)   Diltiazem Rash   Diphenhydramine Hcl Other (See Comments)   Reaction:  Rash and fever a long time ago, but has had it since with no problem.   Maxidex [dexamethasone] Other (See Comments)   Reaction:  Unknown    Prednisone Other (See Comments)   Pt states that this medication made him feel crazy.     Serotonin Other (See Comments)   Tried 2 different types, lexapro and another one.  Reaction:  Made him feel like he was having a heart attack.    Vytorin [ezetimibe-simvastatin] Other (See Comments)   Reaction:  Unknown    Zocor [simvastatin] Other (See Comments)   Reaction:  Unknown         Medication List     STOP taking these medications     cephALEXin 500 MG capsule Commonly known as: KEFLEX   furosemide 20 MG tablet Commonly known as: LASIX   furosemide 40 MG tablet Commonly known as: LASIX   metoprolol tartrate 25 MG tablet Commonly known as: LOPRESSOR       TAKE these medications    acetaminophen 500 MG tablet Commonly known as: TYLENOL Take 500 mg by mouth 2 (two) times daily as needed for mild pain or moderate pain.   atorvastatin 40 MG tablet Commonly known as: LIPITOR Take 1 tablet (40 mg total) by mouth daily with supper. What changed: when to take this   carvedilol 3.125 MG tablet Commonly known as: COREG Take 1 tablet (3.125 mg total) by mouth 2 (two) times daily with a meal.   digoxin 0.125 MG tablet Commonly known as: LANOXIN Take 0.0625 mg by mouth daily.   famotidine 20 MG tablet Commonly known as: PEPCID Take 20 mg by mouth at bedtime.   ferrous sulfate 325 (  65 FE) MG tablet Take 1 tablet (325 mg total) by mouth every Monday, Wednesday, and Friday. With supper   midodrine 10 MG tablet Commonly known as: PROAMATINE Take 1 tablet (10 mg total) by mouth 3 (three) times daily with meals.   multivitamin tablet Take 1 tablet by mouth daily.   pantoprazole 40 MG tablet Commonly known as: PROTONIX Take 1 tablet (40 mg total) by mouth 2 (two) times daily.   polyethylene glycol 17 g packet Commonly known as: MIRALAX / GLYCOLAX Take 17 g by mouth daily as needed for mild constipation.   ranolazine 1000 MG SR tablet Commonly known as: RANEXA Take 500 mg by mouth 2 (two) times daily.   sucralfate 1 g tablet Commonly known as: CARAFATE Take 1 g by mouth 2 (two) times daily.   tamsulosin 0.4 MG Caps capsule Commonly known as: FLOMAX Take 0.4 mg by mouth daily after supper.   torsemide 20 MG tablet Commonly known as: DEMADEX Take 1 tablet (20 mg total) by mouth daily. Start taking on: December 08, 2020   vitamin B-12 500 MCG tablet Commonly known as: CYANOCOBALAMIN Take 500 mcg  by mouth daily.   VITAMIN C PO Take 1 tablet by mouth daily with breakfast.        If you experience worsening of your admission symptoms, develop shortness of breath, life threatening emergency, suicidal or homicidal thoughts you must seek medical attention immediately by calling 911 or calling your MD immediately  if symptoms less severe.  You Must read complete instructions/literature along with all the possible adverse reactions/side effects for all the Medicines you take and that have been prescribed to you. Take any new Medicines after you have completely understood and accept all the possible adverse reactions/side effects.   Please note  You were cared for by a hospitalist during your hospital stay. If you have any questions about your discharge medications or the care you received while you were in the hospital after you are discharged, you can call the unit and asked to speak with the hospitalist on call if the hospitalist that took care of you is not available. Once you are discharged, your primary care physician will handle any further medical issues. Please note that NO REFILLS for any discharge medications will be authorized once you are discharged, as it is imperative that you return to your primary care physician (or establish a relationship with a primary care physician if you do not have one) for your aftercare needs so that they can reassess your need for medications and monitor your lab values. Today   SUBJECTIVE   overall feeling better. Oxygen sats 96% on room air. No leg edema  VITAL SIGNS:  Blood pressure (!) 91/48, pulse 69, temperature 98.1 F (36.7 C), resp. rate 17, height 6\' 4"  (1.93 m), weight 73.2 kg, SpO2 96 %.  I/O:   Intake/Output Summary (Last 24 hours) at 12/07/2020 1003 Last data filed at 12/07/2020 1003 Gross per 24 hour  Intake 240 ml  Output 1230 ml  Net -990 ml    PHYSICAL EXAMINATION:  GENERAL:  87 y.o.-year-old patient lying in the bed  with no acute distress.  LUNGS: Normal breath sounds bilaterally, no wheezing, rales,rhonchi or crepitation. No use of accessory muscles of respiration.  CARDIOVASCULAR: S1, S2 normal. No murmurs, rubs, or gallops.  ABDOMEN: Soft, non-tender, non-distended. Bowel sounds present. No organomegaly or mass.  EXTREMITIES: No pedal edema, cyanosis, or clubbing.  NEUROLOGIC: non-focal PSYCHIATRIC: The patient is alert and  oriented x 3.  SKIN: No obvious rash, lesion, or ulcer.   DATA REVIEW:   CBC  Recent Labs  Lab 12/03/20 0403 12/04/20 0528  WBC 6.3  --   HGB 7.8* 7.8*  HCT 23.7* 24.2*  PLT 103*  --     Chemistries  Recent Labs  Lab 12/07/20 0544  NA 134*  K 3.6  CL 94*  CO2 27  GLUCOSE 96  BUN 94*  CREATININE 2.38*  CALCIUM 9.1    Microbiology Results   Recent Results (from the past 240 hour(s))  Body fluid culture w Gram Stain     Status: None (Preliminary result)   Collection Time: 12/05/20 11:17 AM   Specimen: PATH Cytology Pleural fluid  Result Value Ref Range Status   Specimen Description   Final    PLEURAL Performed at [facility], [address] [place], [place] [address]    Special Requests   Final    PLEURAL Performed at [facility], [address] [place], [place] [address]    Gram Stain NO WBC SEEN NO ORGANISMS SEEN   Final   Culture   Final    NO GROWTH < 24 HOURS Performed at [facility], [place] [address] [place], [place] [address]    Report Status PENDING  Incomplete    RADIOLOGY:  [place] CHEST (PLEURAL EFFUSION)  Result Date: 12/07/2020 CLINICAL DATA:  Interventional radiology consulted for left thoracentesis. EXAM: CHEST ULTRASOUND COMPARISON:  None. FINDINGS: No significant left pleural effusion. IMPRESSION: No significant left pleural effusion.  No thoracentesis performed. Electronically Signed   By: Miachel Roux M.D.   On: 12/07/2020 07:23   DG Chest Port 1 View  Result Date: 12/05/2020 CLINICAL DATA:  Status  post right thoracentesis. EXAM: PORTABLE CHEST 1 VIEW COMPARISON:  December 02, 2020. FINDINGS: Stable cardiomegaly. Sternotomy wires are noted. No definite pneumothorax is noted status post thoracentesis. Pleural effusion appears to be significantly smaller. IMPRESSION: No definite pneumothorax status post right thoracentesis. Electronically Signed   By: Marijo Conception M.D.   On: 12/05/2020 11:46   US THORACENTESIS ASP PLEURAL SPACE W/IMG GUIDE  Result Date: 12/05/2020 INDICATION: Patient history of congestive heart failure, AFib, myelodysplastic syndrome currently admitted for acute exacerbation congestive heart failure and found to have bilateral pleural effusions. Request to IR for diagnostic and therapeutic thoracentesis. EXAM: ULTRASOUND GUIDED RIGHT THORACENTESIS MEDICATIONS: 7 mL% lidocaine COMPLICATIONS: None immediate. PROCEDURE: An ultrasound guided thoracentesis was thoroughly discussed with the patient and questions answered. The benefits, risks, alternatives and complications were also discussed. The patient understands and wishes to proceed with the procedure. Written consent was obtained. Ultrasound was performed to localize and mark an adequate pocket of fluid in the right chest. The area was then prepped and draped in the normal sterile fashion. 1% Lidocaine was used for local anesthesia. Under ultrasound guidance a 6 Fr Safe-T-Centesis catheter was introduced. Thoracentesis was performed. The catheter was removed and a dressing applied. FINDINGS: A total of approximately 2.0 L of clear yellow fluid was removed. Samples were sent to the laboratory as requested by the clinical team. IMPRESSION: Successful ultrasound guided right thoracentesis yielding 2.0 L of pleural fluid. Read by Candiss Norse, PA-C Electronically Signed   By: Markus Daft M.D.   On: 12/05/2020 11:59     CODE STATUS:     Code Status Orders  (From admission, onward)           Start     Ordered  12/01/20 1009  Do  not attempt resuscitation (DNR)  Continuous       Question Answer Comment  In the event of cardiac or respiratory ARREST Do not call a "code blue"   In the event of cardiac or respiratory ARREST Do not perform Intubation, CPR, defibrillation or ACLS   In the event of cardiac or respiratory ARREST Use medication by any route, position, wound care, and other measures to relive pain and suffering. May use oxygen, suction and manual treatment of airway obstruction as needed for comfort.   Comments Daughter as witness at bedside      12/01/20 1008           Code Status History     Date Active Date Inactive Code Status Order ID Comments User Context   11/26/2020 1902 12/01/2020 1008 Full Code [id number]  Marcelyn Bruins, MD ED   10/03/2020 1513 10/04/2020 2203 Full Code [id number]  Ivor Costa, MD ED   08/12/2020 2203 08/17/2020 1821 Full Code [id number]  Clarnce Flock, MD Inpatient   03/03/2020 1649 03/04/2020 2157 Full Code [id number]  Ivor Costa, MD ED   02/26/2020 1412 02/27/2020 1834 Full Code [id number]  Collier Bullock, MD ED   11/06/2019 1201 11/07/2019 1749 Full Code [id number]  Benjamine Sprague, DO Inpatient   07/29/2019 2258 08/02/2019 1929 Full Code [id number]  Jennye Boroughs, MD Inpatient   06/07/2019 1643 06/09/2019 1852 Full Code [id number]  Lorella Nimrod, MD ED   01/07/2019 1514 01/09/2019 1907 Full Code [id number]  Mayo, Pete Pelt, MD ED   01/24/2016 1704 01/26/2016 2045 Full Code [id number]  Domenic Polite, MD Inpatient   12/26/2015 1317 01/02/2016 1650 Full Code [id number]  Bettey Costa, MD ED   12/23/2015 2246 12/24/2015 0835 Full Code [id number]  Lance Coon, MD Inpatient   11/03/2015 1758 11/05/2015 1905 Full Code [id number]  Lytle Butte, MD ED   10/29/2015 0108 11/01/2015 1605 Full Code [id number]  Alesia Richards, MD ED   12/22/2014 0223 12/24/2014 1413 Full Code [id number]  Juluis Mire, MD Inpatient      Advance Directive Documentation    Flowsheet Row Most Recent Value  Type of Advance  Directive Healthcare Power of [place], [place] is POA]  Pre-existing out of facility DNR order (yellow form or pink MOST form) --  "MOST" Form in Place? --        TOTAL TIME TAKING CARE OF THIS PATIENT: 40 minutes.    Fritzi Mandes M.D  [facility]    CC: Primary care physician; Andres Aus, MD

## 2020-12-07 NOTE — Progress Notes (Signed)
[facility]  ROUNDING NOTE   Subjective:   Andres Gutierrez is a 87 y.o. male with medical problems of congestive heart failure, chronic kidney disease, atrial fibrillation, BPH, coronary disease status post CABG, cerebral amyloid angiopathy, GERD, hyperlipidemia, hypertension and myelodysplastic syndrome.  He was originally admitted for increased dyspnea on exertion, worsening edema and weight gain.  Patient seen sitting at side of bed Alert and oriented Says he's feeling much better Breathing has improved   Objective:  Vital signs in last 24 hours:  Temp:  [97.6 F (36.4 C)-98.1 F (36.7 C)] 97.8 F (36.6 C) (07/27 1107) Pulse Rate:  [66-76] 66 (07/27 1107) Resp:  [16-20] 16 (07/27 1107) BP: (89-138)/(48-109) 89/49 (07/27 1107) SpO2:  [95 %-99 %] 99 % (07/27 1107) Weight:  [73.2 kg] 73.2 kg (07/27 0500)  Weight change: -3.302 kg Filed Weights   12/05/20 0320 12/06/20 0500 12/07/20 0500  Weight: 78.7 kg 76.5 kg 73.2 kg    Intake/Output: I/O last 3 completed shifts: In: -  Out: 1980 [Urine:1980]   Intake/Output this shift:  Total I/O In: 240 [P.O.:240] Out: 400 [Urine:400]  Physical Exam: General: NAD, laying in bed  Head: Normocephalic, atraumatic. Moist oral mucosal membranes  Eyes: Anicteric  Lungs:  Clear, improved throughout  Heart: Regular rate and rhythm  Abdomen:  Soft, nontender  Extremities:  no peripheral edema.  Neurologic: Nonfocal, moving all four extremities  Skin: No lesions       Basic Metabolic Panel: Recent Labs  Lab 12/02/20 0444 12/03/20 0403 12/04/20 0528 12/05/20 0553 12/07/20 0544  NA 130* 127* 127* 131* 134*  K 4.6 4.8 3.8 3.3* 3.6  CL 97* 96* 93* 97* 94*  CO2 _0 GLUCOSE 149* 132* 100* 90 96  BUN 59* 73* 94* 94* 94*  CREATININE 2.26* 2.74* 2.79* 2.73* 2.38*  CALCIUM 8.8* 8.3* 8.5* 8.8* 9.1     Liver Function Tests: No results for input(s): AST, ALT, ALKPHOS, BILITOT, PROT, ALBUMIN in the last 168  hours.  No results for input(s): LIPASE, AMYLASE in the last 168 hours. No results for input(s): AMMONIA in the last 168 hours.  CBC: Recent Labs  Lab 12/03/20 0403 12/04/20 0528  WBC 6.3  --   HGB 7.8* 7.8*  HCT 23.7* 24.2*  MCV 101.3*  --   PLT 103*  --      Cardiac Enzymes: No results for input(s): CKTOTAL, CKMB, CKMBINDEX, TROPONINI in the last 168 hours.  BNP: Invalid input(s): POCBNP  CBG: No results for input(s): GLUCAP in the last 168 hours.  Microbiology: Results for orders placed or performed during the hospital encounter of 11/26/20  Resp Panel by RT-PCR (Flu A&B, Covid) Nasopharyngeal Swab     Status: None   Collection Time: 11/26/20  4:13 PM   Specimen: Nasopharyngeal Swab; Nasopharyngeal(NP) swabs in vial transport medium  Result Value Ref Range Status   SARS Coronavirus 2 by RT PCR NEGATIVE NEGATIVE Final    Comment: (NOTE) SARS-CoV-2 target nucleic acids are NOT DETECTED.  The SARS-CoV-2 RNA is generally detectable in upper respiratory specimens during the acute phase of infection. The lowest concentration of SARS-CoV-2 viral copies this assay can detect is 138 copies/mL. A negative result does not preclude SARS-Cov-2 infection and should not be used as the sole basis for treatment or other patient management decisions. A negative result may occur with  improper specimen collection/handling, submission of specimen other than nasopharyngeal swab, presence of viral mutation(s) within the areas targeted  by this assay, and inadequate number of viral copies(<138 copies/mL). A negative result must be combined with clinical observations, patient history, and epidemiological information. The expected result is Negative.  Fact Sheet for Patients:  EntrepreneurPulse.com.au  Fact Sheet for Healthcare Providers:  IncredibleEmployment.be  This test is no t yet approved or cleared by the [place] FDA and  has been  authorized for detection and/or diagnosis of SARS-CoV-2 by FDA under an Emergency Use Authorization (EUA). This EUA will remain  in effect (meaning this test can be used) for the duration of the COVID-19 declaration under Section 564(b)(1) of the Act, 21 U.S.C.section 360bbb-3(b)(1), unless the authorization is terminated  or revoked sooner.       Influenza A by PCR NEGATIVE NEGATIVE Final   Influenza B by PCR NEGATIVE NEGATIVE Final    Comment: (NOTE) The Xpert Xpress SARS-CoV-2/FLU/RSV plus assay is intended as an aid in the diagnosis of influenza from Nasopharyngeal swab specimens and should not be used as a sole basis for treatment. Nasal washings and aspirates are unacceptable for Xpert Xpress SARS-CoV-2/FLU/RSV testing.  Fact Sheet for Patients: EntrepreneurPulse.com.au  Fact Sheet for Healthcare Providers: IncredibleEmployment.be  This test is not yet approved or cleared by the [place] FDA and has been authorized for detection and/or diagnosis of SARS-CoV-2 by FDA under an Emergency Use Authorization (EUA). This EUA will remain in effect (meaning this test can be used) for the duration of the COVID-19 declaration under Section 564(b)(1) of the Act, 21 U.S.C. section 360bbb-3(b)(1), unless the authorization is terminated or revoked.  Performed at [facility], [place] [place], [place] [address]   Body fluid culture w Gram Stain     Status: None (Preliminary result)   Collection Time: 12/05/20 11:17 AM   Specimen: PATH Cytology Pleural fluid  Result Value Ref Range Status   Specimen Description   Final    PLEURAL Performed at [facility], [address] [place], [place] [address]    Special Requests   Final    PLEURAL Performed at [facility], [place], [place] [address]    Gram Stain NO WBC SEEN NO ORGANISMS SEEN   Final   Culture   Final    NO GROWTH 2  DAYS Performed at [facility], [place] [address], [place],  [address]    Report Status PENDING  Incomplete    Coagulation Studies: No results for input(s): LABPROT, INR in the last 72 hours.  Urinalysis: No results for input(s): COLORURINE, LABSPEC, PHURINE, GLUCOSEU, HGBUR, BILIRUBINUR, KETONESUR, PROTEINUR, UROBILINOGEN, NITRITE, LEUKOCYTESUR in the last 72 hours.  Invalid input(s): APPERANCEUR    Imaging: [place] CHEST (PLEURAL EFFUSION)  Result Date: 12/07/2020 CLINICAL DATA:  Interventional radiology consulted for left thoracentesis. EXAM: CHEST ULTRASOUND COMPARISON:  None. FINDINGS: No significant left pleural effusion. IMPRESSION: No significant left pleural effusion.  No thoracentesis performed. Electronically Signed   By: Miachel Roux M.D.   On: 12/07/2020 07:23   DG Chest Port 1 View  Result Date: 12/05/2020 CLINICAL DATA:  Status post right thoracentesis. EXAM: PORTABLE CHEST 1 VIEW COMPARISON:  December 02, 2020. FINDINGS: Stable cardiomegaly. Sternotomy wires are noted. No definite pneumothorax is noted status post thoracentesis. Pleural effusion appears to be significantly smaller. IMPRESSION: No definite pneumothorax status post right thoracentesis. Electronically Signed   By: Marijo Conception M.D.   On: 12/05/2020 11:46   US THORACENTESIS ASP PLEURAL SPACE W/IMG GUIDE  Result Date: 12/05/2020 INDICATION: Patient history  of congestive heart failure, AFib, myelodysplastic syndrome currently admitted for acute exacerbation congestive heart failure and found to have bilateral pleural effusions. Request to IR for diagnostic and therapeutic thoracentesis. EXAM: ULTRASOUND GUIDED RIGHT THORACENTESIS MEDICATIONS: 7 mL% lidocaine COMPLICATIONS: None immediate. PROCEDURE: An ultrasound guided thoracentesis was thoroughly discussed with the patient and questions answered. The benefits, risks, alternatives and complications were also discussed. The patient understands and wishes to  proceed with the procedure. Written consent was obtained. Ultrasound was performed to localize and mark an adequate pocket of fluid in the right chest. The area was then prepped and draped in the normal sterile fashion. 1% Lidocaine was used for local anesthesia. Under ultrasound guidance a 6 Fr Safe-T-Centesis catheter was introduced. Thoracentesis was performed. The catheter was removed and a dressing applied. FINDINGS: A total of approximately 2.0 L of clear yellow fluid was removed. Samples were sent to the laboratory as requested by the clinical team. IMPRESSION: Successful ultrasound guided right thoracentesis yielding 2.0 L of pleural fluid. Read by Candiss Norse, PA-C Electronically Signed   By: Markus Daft M.D.   On: 12/05/2020 11:59     Medications:      atorvastatin  40 mg Oral QHS   carvedilol  3.125 mg Oral BID WC   digoxin  0.0625 mg Oral Daily   ferrous sulfate  325 mg Oral Q M,W,F   hydrALAZINE  25 mg Oral BID   isosorbide mononitrate  30 mg Oral Daily   metolazone  2.5 mg Oral Q M,W,F   midodrine  10 mg Oral TID WC   sodium chloride flush  3 mL Intravenous Q12H   sucralfate  1 g Oral BID   tamsulosin  0.4 mg Oral QPC supper   torsemide  20 mg Oral Daily   acetaminophen **OR** acetaminophen, ipratropium-albuterol, polyethylene glycol  Assessment/ Plan:  Mr. Andres Gutierrez is a 87 y.o.  male with medical problems of congestive heart failure, chronic kidney disease, atrial fibrillation, BPH, coronary disease status post CABG, cerebral amyloid angiopathy, GERD, hyperlipidemia, hypertension and myelodysplastic syndrome.  He was originally admitted for increased dyspnea on exertion, worsening edema and weight gain.  Acute exacerbation of CHF (congestive heart failure) (HCC) [I50.9] Dyspnea, unspecified type [R06.00] Acute on chronic congestive heart failure, unspecified heart failure type ([place]) [I50.9] Acute on chronic combined systolic (congestive) and diastolic (congestive)  heart failure (HCC) [I50.43]   Acute kidney disease on CKD st 3B, likely due to CHF exacerbation and diuresis  Baseline 1.6 and eGFR 41 on 08/11/20 -CKD risk factors include atherosclerosis, hypertension and advanced age. CT abd/pelvis in May shows scattered bilateral cyst, 2 mm nonobstructing stone in left kidney, no hydronephrosis.  - Creatinine continues to improve - Will follow up with our office at discharge   2. Volume overload and acute on chronic systolic and diastolic CHF -2D ECHO 5/88/32-PQDI 25-30%, severely decreased function with global hypokinesis, moderate LVH and grade 2 diastolic dysfunction. Progressive shortness of breath requiring oxygen supplementation. - Weaned to room air - Tolerating ambulation with PT, with minimal rest breaks - Torsemide 89m daily and Metolazone 2.557mMWF.       LOS: 10   7/27/202211:12 AM

## 2020-12-07 NOTE — TOC Transition Note (Signed)
Transition of Care [facility]) - CM/SW Discharge Note   Patient Details  Name: Andres Gutierrez MRN: [id number] Date of Birth: 03/04/1931  Transition of Care [facility]) CM/SW Contact:  Eileen Stanford, LCSW Phone Number: 12/07/2020, 10:27 AM   Clinical Narrative:   [place] arranged through [place]. NO additional needs.    Final next level of care: [place] Barriers to Discharge: No Barriers Identified   Patient Goals and CMS Choice Patient states their goals for this hospitalization and ongoing recovery are:: to go home CMS Medicare.gov Compare Post Acute Care list provided to:: Patient Represenative (must comment) (daughter [name] at bedside) Choice offered to / list presented to : Adult Children  Discharge Placement                    Patient and family notified of of transfer: 12/07/20  Discharge Plan and Services     Post Acute Care Choice: Skilled Nursing Facility                    HH Arranged: PT, OT, RN, Nurse's Aide [facility] Agency: [place] ([facility]) Date [facility] Agency Contacted: 12/07/20 Time [place]: 1026 Representative spoke with at [place]: [place] ([place]) Interventions     Readmission Risk Interventions Readmission Risk Prevention Plan 07/30/2019 06/08/2019  Transportation Screening Complete Complete  PCP or Specialist Appt within 3-5 Days Complete Complete  HRI or [place] Complete Complete  Social Work Consult for [place] Planning/Counseling - Complete  Palliative Care Screening - Not Applicable  Medication Review Press photographer) Complete Complete  Some recent data might be hidden

## 2020-12-08 DIAGNOSIS — Z9181 History of falling: Secondary | ICD-10-CM | POA: Diagnosis not present

## 2020-12-08 DIAGNOSIS — N400 Enlarged prostate without lower urinary tract symptoms: Secondary | ICD-10-CM | POA: Diagnosis not present

## 2020-12-08 DIAGNOSIS — F419 Anxiety disorder, unspecified: Secondary | ICD-10-CM | POA: Diagnosis not present

## 2020-12-08 DIAGNOSIS — D469 Myelodysplastic syndrome, unspecified: Secondary | ICD-10-CM | POA: Diagnosis not present

## 2020-12-08 DIAGNOSIS — F17220 Nicotine dependence, chewing tobacco, uncomplicated: Secondary | ICD-10-CM | POA: Diagnosis not present

## 2020-12-08 DIAGNOSIS — I429 Cardiomyopathy, unspecified: Secondary | ICD-10-CM | POA: Diagnosis not present

## 2020-12-08 DIAGNOSIS — I252 Old myocardial infarction: Secondary | ICD-10-CM | POA: Diagnosis not present

## 2020-12-08 DIAGNOSIS — N1832 Chronic kidney disease, stage 3b: Secondary | ICD-10-CM | POA: Diagnosis not present

## 2020-12-08 DIAGNOSIS — D696 Thrombocytopenia, unspecified: Secondary | ICD-10-CM | POA: Diagnosis not present

## 2020-12-08 DIAGNOSIS — Z79899 Other long term (current) drug therapy: Secondary | ICD-10-CM | POA: Diagnosis not present

## 2020-12-08 DIAGNOSIS — I680 Cerebral amyloid angiopathy: Secondary | ICD-10-CM | POA: Diagnosis not present

## 2020-12-08 DIAGNOSIS — I482 Chronic atrial fibrillation, unspecified: Secondary | ICD-10-CM | POA: Diagnosis not present

## 2020-12-08 DIAGNOSIS — Z951 Presence of aortocoronary bypass graft: Secondary | ICD-10-CM | POA: Diagnosis not present

## 2020-12-08 DIAGNOSIS — I5043 Acute on chronic combined systolic (congestive) and diastolic (congestive) heart failure: Secondary | ICD-10-CM | POA: Diagnosis not present

## 2020-12-08 DIAGNOSIS — K2100 Gastro-esophageal reflux disease with esophagitis, without bleeding: Secondary | ICD-10-CM | POA: Diagnosis not present

## 2020-12-08 DIAGNOSIS — J918 Pleural effusion in other conditions classified elsewhere: Secondary | ICD-10-CM | POA: Diagnosis not present

## 2020-12-08 DIAGNOSIS — N179 Acute kidney failure, unspecified: Secondary | ICD-10-CM | POA: Diagnosis not present

## 2020-12-08 DIAGNOSIS — Z955 Presence of coronary angioplasty implant and graft: Secondary | ICD-10-CM | POA: Diagnosis not present

## 2020-12-08 DIAGNOSIS — I25119 Atherosclerotic heart disease of native coronary artery with unspecified angina pectoris: Secondary | ICD-10-CM | POA: Diagnosis not present

## 2020-12-08 DIAGNOSIS — D630 Anemia in neoplastic disease: Secondary | ICD-10-CM | POA: Diagnosis not present

## 2020-12-08 DIAGNOSIS — E854 Organ-limited amyloidosis: Secondary | ICD-10-CM | POA: Diagnosis not present

## 2020-12-08 DIAGNOSIS — D509 Iron deficiency anemia, unspecified: Secondary | ICD-10-CM | POA: Diagnosis not present

## 2020-12-08 DIAGNOSIS — J9601 Acute respiratory failure with hypoxia: Secondary | ICD-10-CM | POA: Diagnosis not present

## 2020-12-08 DIAGNOSIS — H00019 Hordeolum externum unspecified eye, unspecified eyelid: Secondary | ICD-10-CM | POA: Diagnosis not present

## 2020-12-08 DIAGNOSIS — I130 Hypertensive heart and chronic kidney disease with heart failure and stage 1 through stage 4 chronic kidney disease, or unspecified chronic kidney disease: Secondary | ICD-10-CM | POA: Diagnosis not present

## 2020-12-08 LAB — BODY FLUID CULTURE W GRAM STAIN
Culture: NO GROWTH
Gram Stain: NONE SEEN

## 2020-12-09 ENCOUNTER — Emergency Department: Payer: PPO

## 2020-12-09 ENCOUNTER — Observation Stay: Payer: PPO | Admitting: Anesthesiology

## 2020-12-09 ENCOUNTER — Observation Stay
Admission: EM | Admit: 2020-12-09 | Discharge: 2020-12-11 | Disposition: A | Discharge status: Home / Self Care | Payer: PPO | Attending: Internal Medicine | Admitting: Internal Medicine

## 2020-12-09 ENCOUNTER — Observation Stay: Payer: PPO

## 2020-12-09 ENCOUNTER — Other Ambulatory Visit: Payer: Self-pay

## 2020-12-09 ENCOUNTER — Encounter
Admission: EM | Disposition: A | Discharge status: Home / Self Care | Payer: Self-pay | Source: Home / Self Care | Attending: Emergency Medicine

## 2020-12-09 DIAGNOSIS — K31819 Angiodysplasia of stomach and duodenum without bleeding: Secondary | ICD-10-CM | POA: Diagnosis not present

## 2020-12-09 DIAGNOSIS — R531 Weakness: Secondary | ICD-10-CM

## 2020-12-09 DIAGNOSIS — N281 Cyst of kidney, acquired: Secondary | ICD-10-CM | POA: Diagnosis not present

## 2020-12-09 DIAGNOSIS — K2101 Gastro-esophageal reflux disease with esophagitis, with bleeding: Secondary | ICD-10-CM

## 2020-12-09 DIAGNOSIS — Z8616 Personal history of COVID-19: Secondary | ICD-10-CM | POA: Diagnosis not present

## 2020-12-09 DIAGNOSIS — Z20822 Contact with and (suspected) exposure to covid-19: Secondary | ICD-10-CM | POA: Insufficient documentation

## 2020-12-09 DIAGNOSIS — N200 Calculus of kidney: Secondary | ICD-10-CM | POA: Diagnosis not present

## 2020-12-09 DIAGNOSIS — I130 Hypertensive heart and chronic kidney disease with heart failure and stage 1 through stage 4 chronic kidney disease, or unspecified chronic kidney disease: Secondary | ICD-10-CM | POA: Insufficient documentation

## 2020-12-09 DIAGNOSIS — I482 Chronic atrial fibrillation, unspecified: Secondary | ICD-10-CM | POA: Diagnosis present

## 2020-12-09 DIAGNOSIS — R195 Other fecal abnormalities: Secondary | ICD-10-CM | POA: Insufficient documentation

## 2020-12-09 DIAGNOSIS — I2510 Atherosclerotic heart disease of native coronary artery without angina pectoris: Secondary | ICD-10-CM | POA: Diagnosis present

## 2020-12-09 DIAGNOSIS — K8689 Other specified diseases of pancreas: Secondary | ICD-10-CM | POA: Diagnosis not present

## 2020-12-09 DIAGNOSIS — R0789 Other chest pain: Secondary | ICD-10-CM | POA: Diagnosis not present

## 2020-12-09 DIAGNOSIS — G319 Degenerative disease of nervous system, unspecified: Secondary | ICD-10-CM | POA: Diagnosis not present

## 2020-12-09 DIAGNOSIS — D4620 Refractory anemia with excess of blasts, unspecified: Secondary | ICD-10-CM | POA: Diagnosis not present

## 2020-12-09 DIAGNOSIS — I248 Other forms of acute ischemic heart disease: Secondary | ICD-10-CM | POA: Insufficient documentation

## 2020-12-09 DIAGNOSIS — K5520 Angiodysplasia of colon without hemorrhage: Secondary | ICD-10-CM | POA: Diagnosis not present

## 2020-12-09 DIAGNOSIS — D500 Iron deficiency anemia secondary to blood loss (chronic): Secondary | ICD-10-CM | POA: Diagnosis not present

## 2020-12-09 DIAGNOSIS — I5042 Chronic combined systolic (congestive) and diastolic (congestive) heart failure: Secondary | ICD-10-CM | POA: Diagnosis present

## 2020-12-09 DIAGNOSIS — Z951 Presence of aortocoronary bypass graft: Secondary | ICD-10-CM | POA: Insufficient documentation

## 2020-12-09 DIAGNOSIS — Z955 Presence of coronary angioplasty implant and graft: Secondary | ICD-10-CM | POA: Diagnosis not present

## 2020-12-09 DIAGNOSIS — N1832 Chronic kidney disease, stage 3b: Secondary | ICD-10-CM | POA: Diagnosis not present

## 2020-12-09 DIAGNOSIS — I10 Essential (primary) hypertension: Secondary | ICD-10-CM | POA: Diagnosis not present

## 2020-12-09 DIAGNOSIS — I5043 Acute on chronic combined systolic (congestive) and diastolic (congestive) heart failure: Secondary | ICD-10-CM | POA: Diagnosis not present

## 2020-12-09 DIAGNOSIS — E785 Hyperlipidemia, unspecified: Secondary | ICD-10-CM | POA: Diagnosis not present

## 2020-12-09 DIAGNOSIS — N400 Enlarged prostate without lower urinary tract symptoms: Secondary | ICD-10-CM | POA: Diagnosis present

## 2020-12-09 DIAGNOSIS — F17290 Nicotine dependence, other tobacco product, uncomplicated: Secondary | ICD-10-CM | POA: Insufficient documentation

## 2020-12-09 DIAGNOSIS — R42 Dizziness and giddiness: Secondary | ICD-10-CM | POA: Diagnosis not present

## 2020-12-09 DIAGNOSIS — N183 Chronic kidney disease, stage 3 unspecified: Secondary | ICD-10-CM | POA: Diagnosis present

## 2020-12-09 DIAGNOSIS — Z79899 Other long term (current) drug therapy: Secondary | ICD-10-CM | POA: Insufficient documentation

## 2020-12-09 DIAGNOSIS — I2489 Other forms of acute ischemic heart disease: Secondary | ICD-10-CM

## 2020-12-09 DIAGNOSIS — K922 Gastrointestinal hemorrhage, unspecified: Secondary | ICD-10-CM | POA: Diagnosis present

## 2020-12-09 DIAGNOSIS — R7989 Other specified abnormal findings of blood chemistry: Secondary | ICD-10-CM | POA: Diagnosis present

## 2020-12-09 DIAGNOSIS — R778 Other specified abnormalities of plasma proteins: Secondary | ICD-10-CM | POA: Insufficient documentation

## 2020-12-09 DIAGNOSIS — K2100 Gastro-esophageal reflux disease with esophagitis, without bleeding: Secondary | ICD-10-CM | POA: Diagnosis present

## 2020-12-09 DIAGNOSIS — R079 Chest pain, unspecified: Secondary | ICD-10-CM

## 2020-12-09 DIAGNOSIS — D46Z Other myelodysplastic syndromes: Secondary | ICD-10-CM | POA: Diagnosis present

## 2020-12-09 DIAGNOSIS — K6389 Other specified diseases of intestine: Secondary | ICD-10-CM | POA: Diagnosis not present

## 2020-12-09 HISTORY — PX: ENTEROSCOPY: SHX5533

## 2020-12-09 LAB — CBC
HCT: 23.9 % — ABNORMAL LOW (ref 39.0–52.0)
HCT: 24 % — ABNORMAL LOW (ref 39.0–52.0)
HCT: 24.2 % — ABNORMAL LOW (ref 39.0–52.0)
Hemoglobin: 7.8 g/dL — ABNORMAL LOW (ref 13.0–17.0)
Hemoglobin: 8.1 g/dL — ABNORMAL LOW (ref 13.0–17.0)
Hemoglobin: 8 g/dL — ABNORMAL LOW (ref 13.0–17.0)
MCH: 32.6 pg (ref 26.0–34.0)
MCH: 34.9 pg — ABNORMAL HIGH (ref 26.0–34.0)
MCH: 33.5 pg (ref 26.0–34.0)
MCHC: 32.6 g/dL (ref 30.0–36.0)
MCHC: 33.8 g/dL (ref 30.0–36.0)
MCHC: 33.1 g/dL (ref 30.0–36.0)
MCV: 100 fL (ref 80.0–100.0)
MCV: 103.4 fL — ABNORMAL HIGH (ref 80.0–100.0)
MCV: 101.3 fL — ABNORMAL HIGH (ref 80.0–100.0)
Platelets: 160 10*3/uL (ref 150–400)
Platelets: 121 10*3/uL — ABNORMAL LOW (ref 150–400)
Platelets: 164 10*3/uL (ref 150–400)
RBC: 2.39 MIL/uL — ABNORMAL LOW (ref 4.22–5.81)
RBC: 2.32 MIL/uL — ABNORMAL LOW (ref 4.22–5.81)
RBC: 2.39 MIL/uL — ABNORMAL LOW (ref 4.22–5.81)
RDW: 20 % — ABNORMAL HIGH (ref 11.5–15.5)
RDW: 21.1 % — ABNORMAL HIGH (ref 11.5–15.5)
RDW: 19.9 % — ABNORMAL HIGH (ref 11.5–15.5)
WBC: 12.8 10*3/uL — ABNORMAL HIGH (ref 4.0–10.5)
WBC: 9.1 10*3/uL (ref 4.0–10.5)
WBC: 9.6 10*3/uL (ref 4.0–10.5)
nRBC: 0 % (ref 0.0–0.2)
nRBC: 0 % (ref 0.0–0.2)
nRBC: 0 % (ref 0.0–0.2)

## 2020-12-09 LAB — BRAIN NATRIURETIC PEPTIDE: B Natriuretic Peptide: 1091 pg/mL — ABNORMAL HIGH (ref 0.0–100.0)

## 2020-12-09 LAB — RESP PANEL BY RT-PCR (FLU A&B, COVID) ARPGX2
Influenza A by PCR: NEGATIVE
Influenza B by PCR: NEGATIVE
SARS Coronavirus 2 by RT PCR: NEGATIVE

## 2020-12-09 LAB — COMPREHENSIVE METABOLIC PANEL
ALT: 28 U/L (ref 0–44)
AST: 34 U/L (ref 15–41)
Albumin: 4.3 g/dL (ref 3.5–5.0)
Alkaline Phosphatase: 56 U/L (ref 38–126)
Anion gap: 13 (ref 5–15)
BUN: 97 mg/dL — ABNORMAL HIGH (ref 8–23)
CO2: 26 mmol/L (ref 22–32)
Calcium: 9.3 mg/dL (ref 8.9–10.3)
Chloride: 96 mmol/L — ABNORMAL LOW (ref 98–111)
Creatinine, Ser: 2.35 mg/dL — ABNORMAL HIGH (ref 0.61–1.24)
GFR, Estimated: 26 mL/min — ABNORMAL LOW (ref >60)
Glucose, Bld: 124 mg/dL — ABNORMAL HIGH (ref 70–99)
Potassium: 3.1 mmol/L — ABNORMAL LOW (ref 3.5–5.1)
Sodium: 135 mmol/L (ref 135–145)
Total Bilirubin: 1.6 mg/dL — ABNORMAL HIGH (ref 0.3–1.2)
Total Protein: 7.1 g/dL (ref 6.5–8.1)

## 2020-12-09 LAB — URINALYSIS, COMPLETE (UACMP) WITH MICROSCOPIC
Bacteria, UA: NONE SEEN
Bilirubin Urine: NEGATIVE
Glucose, UA: NEGATIVE mg/dL
Hgb urine dipstick: NEGATIVE
Ketones, ur: NEGATIVE mg/dL
Leukocytes,Ua: NEGATIVE
Nitrite: NEGATIVE
Protein, ur: NEGATIVE mg/dL
Specific Gravity, Urine: 1.01 (ref 1.005–1.030)
pH: 5 (ref 5.0–8.0)

## 2020-12-09 LAB — CBC WITH DIFFERENTIAL/PLATELET
Abs Immature Granulocytes: 0.16 10*3/uL — ABNORMAL HIGH (ref 0.00–0.07)
Basophils Absolute: 0 10*3/uL (ref 0.0–0.1)
Basophils Relative: 0 %
Eosinophils Absolute: 0 10*3/uL (ref 0.0–0.5)
Eosinophils Relative: 0 %
HCT: 26.6 % — ABNORMAL LOW (ref 39.0–52.0)
Hemoglobin: 8.8 g/dL — ABNORMAL LOW (ref 13.0–17.0)
Immature Granulocytes: 2 %
Lymphocytes Relative: 25 %
Lymphs Abs: 2.7 10*3/uL (ref 0.7–4.0)
MCH: 32.6 pg (ref 26.0–34.0)
MCHC: 33.1 g/dL (ref 30.0–36.0)
MCV: 98.5 fL (ref 80.0–100.0)
Monocytes Absolute: 3.1 10*3/uL — ABNORMAL HIGH (ref 0.1–1.0)
Monocytes Relative: 28 %
Neutro Abs: 4.8 10*3/uL (ref 1.7–7.7)
Neutrophils Relative %: 45 %
Platelets: 140 10*3/uL — ABNORMAL LOW (ref 150–400)
RBC: 2.7 MIL/uL — ABNORMAL LOW (ref 4.22–5.81)
RDW: 20.5 % — ABNORMAL HIGH (ref 11.5–15.5)
Smear Review: NORMAL
WBC: 10.8 10*3/uL — ABNORMAL HIGH (ref 4.0–10.5)
nRBC: 0 % (ref 0.0–0.2)

## 2020-12-09 LAB — LIPID PANEL
Cholesterol: 71 mg/dL (ref 0–200)
HDL: 27 mg/dL — ABNORMAL LOW (ref >40)
LDL Cholesterol: 30 mg/dL (ref 0–99)
Total CHOL/HDL Ratio: 2.6 RATIO
Triglycerides: 70 mg/dL (ref <150)
VLDL: 14 mg/dL (ref 0–40)

## 2020-12-09 LAB — TROPONIN I (HIGH SENSITIVITY)
Troponin I (High Sensitivity): 88 ng/L — ABNORMAL HIGH (ref <18)
Troponin I (High Sensitivity): 78 ng/L — ABNORMAL HIGH (ref <18)
Troponin I (High Sensitivity): 73 ng/L — ABNORMAL HIGH (ref <18)
Troponin I (High Sensitivity): 189 ng/L (ref <18)
Troponin I (High Sensitivity): 244 ng/L (ref <18)

## 2020-12-09 LAB — TYPE AND SCREEN
ABO/RH(D): A POS
Antibody Screen: NEGATIVE

## 2020-12-09 LAB — APTT: aPTT: 37 seconds — ABNORMAL HIGH (ref 24–36)

## 2020-12-09 LAB — MAGNESIUM: Magnesium: 1.7 mg/dL (ref 1.7–2.4)

## 2020-12-09 LAB — PROTIME-INR
INR: 1.2 (ref 0.8–1.2)
Prothrombin Time: 15.1 seconds (ref 11.4–15.2)

## 2020-12-09 SURGERY — ENTEROSCOPY
Anesthesia: Monitor Anesthesia Care

## 2020-12-09 MED ORDER — PANTOPRAZOLE 80MG IVPB - SIMPLE MED
80.0000 mg | Freq: Once | INTRAVENOUS | Status: AC
  Administered 2020-12-09: 80 mg via INTRAVENOUS
  Filled 2020-12-09: qty 100

## 2020-12-09 MED ORDER — PANTOPRAZOLE INFUSION (NEW) - SIMPLE MED
8.0000 mg/h | INTRAVENOUS | Status: DC
  Administered 2020-12-09 (×3): 8 mg/h via INTRAVENOUS
  Filled 2020-12-09 (×2): qty 100
  Filled 2020-12-09: qty 80

## 2020-12-09 MED ORDER — SODIUM CHLORIDE 0.9 % IV BOLUS
500.0000 mL | Freq: Once | INTRAVENOUS | Status: AC
  Administered 2020-12-09: 500 mL via INTRAVENOUS

## 2020-12-09 MED ORDER — SODIUM CHLORIDE 0.9 % IV SOLN: INTRAVENOUS | Status: DC

## 2020-12-09 MED ORDER — PHENYLEPHRINE HCL (PRESSORS) 10 MG/ML IV SOLN
INTRAVENOUS | Status: DC | PRN
  Administered 2020-12-09 (×3): 100 ug via INTRAVENOUS

## 2020-12-09 MED ORDER — PROPOFOL 10 MG/ML IV BOLUS
INTRAVENOUS | Status: DC | PRN
  Administered 2020-12-09: 10 mg via INTRAVENOUS
  Administered 2020-12-09: 15 mg via INTRAVENOUS
  Administered 2020-12-09 (×3): 10 mg via INTRAVENOUS
  Administered 2020-12-09: 15 mg via INTRAVENOUS
  Administered 2020-12-09: 25 mg via INTRAVENOUS

## 2020-12-09 MED ORDER — ACETAMINOPHEN 325 MG PO TABS
650.0000 mg | ORAL_TABLET | Freq: Four times a day (QID) | ORAL | Status: DC | PRN
  Administered 2020-12-09 (×2): 650 mg via ORAL
  Filled 2020-12-09 (×2): qty 2

## 2020-12-09 MED ORDER — ASPIRIN 81 MG PO CHEW
324.0000 mg | CHEWABLE_TABLET | Freq: Once | ORAL | Status: DC
  Filled 2020-12-09: qty 4

## 2020-12-09 MED ORDER — LORAZEPAM 2 MG/ML IJ SOLN: 1.0000 mg | Freq: Two times a day (BID) | INTRAMUSCULAR | Status: DC | PRN

## 2020-12-09 MED ORDER — BUTAMBEN-TETRACAINE-BENZOCAINE 2-2-14 % EX AERO
INHALATION_SPRAY | CUTANEOUS | Status: AC
  Filled 2020-12-09: qty 5

## 2020-12-09 MED ORDER — HEPARIN SODIUM (PORCINE) 5000 UNIT/ML IJ SOLN: 4000.0000 [IU] | Freq: Once | INTRAMUSCULAR | Status: DC

## 2020-12-09 MED ORDER — BUTAMBEN-TETRACAINE-BENZOCAINE 2-2-14 % EX AERO
INHALATION_SPRAY | CUTANEOUS | Status: DC | PRN
  Administered 2020-12-09: 1 via TOPICAL

## 2020-12-09 MED ORDER — HYDROXYZINE HCL 10 MG PO TABS
10.0000 mg | ORAL_TABLET | Freq: Three times a day (TID) | ORAL | Status: DC | PRN
  Administered 2020-12-09: 10 mg via ORAL
  Filled 2020-12-09 (×3): qty 1

## 2020-12-09 MED ORDER — POTASSIUM CHLORIDE CRYS ER 20 MEQ PO TBCR
40.0000 meq | EXTENDED_RELEASE_TABLET | Freq: Once | ORAL | Status: AC
  Administered 2020-12-09: 40 meq via ORAL
  Filled 2020-12-09: qty 2

## 2020-12-09 NOTE — Progress Notes (Addendum)
Called for possible code STEMI. Patient recently discharged after 12 day hospitalization for CHF, s/p thoracentesis, returns with cc of weakness, family concerned about CVA, patient without chest pain. Initial ECG shows AF at 101 bpm with nonspecific IVCD with nondiagnostic ST abnormalities inferior and laterally. Second ECG shows improved rate at 84 bpm, looks more similar to ECG from 12/27/2020. Patient also strongly heme positive. Patient has known CAD, s/p CABG with ischemic cardiomyopathy. In the absence of CP with non diagnostic ECG and heme positive stool, seems more prudent to cancel code STEMI at this time and continue ER workup.  First troponin 88, second troponin 78, very unlikley STEMI or NSTEMI. Would defer cardiac catheterization in the absence of chest pain, with heme positive stool, and underlying CKD with high risk for contrast induced nephrotoxicity.

## 2020-12-09 NOTE — ED Notes (Signed)
Endo called for report.

## 2020-12-09 NOTE — Anesthesia Postprocedure Evaluation (Signed)
Anesthesia Post Note  Patient: Andres Gutierrez  Procedure(s) Performed: ENTEROSCOPY  Patient location during evaluation: PACU Anesthesia Type: MAC Level of consciousness: awake and alert Pain management: pain level controlled Vital Signs Assessment: post-procedure vital signs reviewed and stable Respiratory status: spontaneous breathing Postop Assessment: no headache Anesthetic complications: no   No notable events documented.   Last Vitals:  Vitals:   12/09/20 1130 12/09/20 1237  BP: (!) 105/59 95/64  Pulse: (!) 103 (!) 104  Resp: (!) 25 17  Temp:  (!) 36.3 C  SpO2: 92% 96%    Last Pain:  Vitals:   12/09/20 1433  TempSrc:   PainSc: 0-No pain                 Milinda Pointer

## 2020-12-09 NOTE — H&P (Addendum)
History and Physical    Andres Gutierrez:[id number] DOB: 15-Jul-1930 DOA: 12/09/2020  Referring MD/NP/PA:   PCP: Rusty Aus, MD   Patient coming from:  The patient is coming from home.    Chief Complaint: weakness and dark stool  HPI: Andres Gutierrez is a 87 y.o. male with medical history significant of hypertension, hyperlipidemia, CAD, CABG, MDS (myelodysplastic syndrome), CHF with EF 25 to 30%, CKD stage IIIb, atrial fibrillation not on anticoagulants, anemia, BPH, GI bleeding, ICH, who presents with generalized weakness, dark stool.  Patient was recently hospitalized from 7/16-7/27 due to CHF exacerbation. Pt had right side thoracentesis.  He states that he has been having intermittent dark stool recently.  No nausea, vomiting or diarrhea.  Patient has mild central abdominal pain. Patient states that he had some chest pain last night, which has resolved currently.  He has shortness breath, no cough, fever or chills.  Denies symptoms of UTI.  Patient has generalized weakness and mildly dizziness  Of note, pt was found to have an elevated troponin 88 in ED.  EKG showed atypical ST elevation in V2-V5.  Code STEMI was called, but was canceled off by Dr. Saralyn Pilar of card since patient does not have active chest pain, clinically does not seem to have STEMI.  ED Course: pt was found to have troponin level 88, 78, 73, BNP 1091, positive FOBT, negative COVID-19 PCR, hemoglobin 8.7 on 11/30/2020 --> 8.8 --> 7.8.  WBC 10.8.  INR 1.2, PTT 37, negative urinalysis.  Renal function close to recent baseline, potassium 3.1.  Temperature 97.5, blood pressure 73/47 which improved to 95/55 after giving 500 cc normal saline in ED.  Heart rate 113 -->76, RR 20, oxygen saturation 91-98% on room air.  Chest x-ray negative for infiltration or pleural effusion.  Patient is placed on progressive bed for observation.  Dr. Vicente Males of GI is consulted  Review of Systems:   General: no fevers, chills, no body weight gain,  has fatigue HEENT: no blurry vision, hearing changes or sore throat Respiratory: has dyspnea, no coughing, wheezing CV: has chest pain, no palpitations GI: no nausea, vomiting, has abdominal pain, no diarrhea, constipation. Has dark stook. GU: no dysuria, burning on urination, increased urinary frequency, hematuria  Ext: has trace leg edema Neuro: no unilateral weakness, numbness, or tingling, no vision change or hearing loss. Has mild dizziness Skin: no rash, no skin tear. MSK: No muscle spasm, no deformity, no limitation of range of movement in spin Heme: No easy bruising.  Travel history: No recent long distant travel.  Allergy:  Allergies  Allergen Reactions   Levaquin [Levofloxacin] Other (See Comments)    [name], her daughter, said Levaquin should be avoided because when patient had Levaquin a few years ago, he had "a bad reaction".  She said he could not walk and could not lift up his feet.   Escitalopram Other (See Comments)    Reaction:  Makes him feel faint, like he was going to have a heart attack.   5ht3 Receptor Antagonists Other (See Comments)   Diltiazem Rash   Diphenhydramine Hcl Other (See Comments)    Reaction:  Rash and fever a long time ago, but has had it since with no problem.   Maxidex [Dexamethasone] Other (See Comments)    Reaction:  Unknown    Prednisone Other (See Comments)    Pt states that this medication made him feel crazy.     Serotonin Other (See Comments)    Tried  2 different types, lexapro and another one.  Reaction:  Made him feel like he was having a heart attack.    Vytorin [Ezetimibe-Simvastatin] Other (See Comments)    Reaction:  Unknown    Zocor [Simvastatin] Other (See Comments)    Reaction:  Unknown     Past Medical History:  Diagnosis Date   Acalculous cholecystitis 10/29/2015   Acute on chronic heart failure ([place]) 06/07/2019   Anemia    Anginal pain (HCC)    Anxiety    Atrial fibrillation with RVR ([place]) 12/23/2015   Cardiomyopathy  (HCC)    Cerebral amyloid angiopathy (HCC)    CHF (congestive heart failure) (HCC)    Chronic kidney disease    kidney stones   Coronary artery disease    Cough    Dyspnea    GERD (gastroesophageal reflux disease)    GERD (gastroesophageal reflux disease) 12/23/2015   History of kidney stones    Hypertension    Lymphadenopathy, hilar    MDS (myelodysplastic syndrome) (HCC)    MI (myocardial infarction) ([place])    x 2   Wheezing     Past Surgical History:  Procedure Laterality Date   BRONCHIAL NEEDLE ASPIRATION BIOPSY N/A 12/31/2014   Procedure: BRONCHIAL NEEDLE ASPIRATION BIOPSIES from carina;  Surgeon: Flora Lipps, MD;  Location: [facility];  Service: Cardiopulmonary;  Laterality: N/A;   CARDIAC CATHETERIZATION N/A 12/28/2015   Procedure: Left Heart Cath and Cors/Grafts Angiography;  Surgeon: Yolonda Kida, MD;  Location: [place] [facility];  Service: Cardiovascular;  Laterality: N/A;   CATARACT EXTRACTION W/PHACO Left 08/01/2016   Procedure: CATARACT EXTRACTION PHACO AND INTRAOCULAR LENS PLACEMENT ([place]) Left;  Surgeon: Leandrew Koyanagi, MD;  Location: [place];  Service: Ophthalmology;  Laterality: Left;   CHOLECYSTECTOMY     COLON SURGERY     CORONARY ANGIOPLASTY WITH STENT PLACEMENT     CORONARY ARTERY BYPASS GRAFT     CYSTOSCOPY/URETEROSCOPY/HOLMIUM LASER/STENT PLACEMENT Left 03/18/2020   Procedure: CYSTOSCOPY/URETEROSCOPY/HOLMIUM LASER/STENT PLACEMENT;  Surgeon: Billey Co, MD;  Location: [facility];  Service: Urology;  Laterality: Left;   ENDOBRONCHIAL ULTRASOUND N/A 12/31/2014   Procedure: ENDOBRONCHIAL ULTRASOUND;  Surgeon: Flora Lipps, MD;  Location: [facility];  Service: Cardiopulmonary;  Laterality: N/A;   ENTEROSCOPY N/A 12/09/2020   Procedure: ENTEROSCOPY;  Surgeon: [name], MD;  Location[facility] ENDOSCOPY;  Service: Gastroenterology;  Laterality: N/A;   ESOPHAGOGASTRODUODENOSCOPY N/A 08/15/2020   Procedure: ESOPHAGOGASTRODUODENOSCOPY (EGD);  Surgeon:  Toledo, Benay Pike, MD;  Location: [facility];  Service: Gastroenterology;  Laterality: N/A;   ESOPHAGOGASTRODUODENOSCOPY (EGD) WITH PROPOFOL N/A 06/20/2015   Procedure: ESOPHAGOGASTRODUODENOSCOPY (EGD) WITH PROPOFOL;  Surgeon: Lollie Sails, MD;  Location: [facility] END[facility]COPY;  Service: Endoscopy;  Laterality: N/A;   IR CATHETER TUBE CHANGE  10/26/2019   IR CHOLANGIOGRAM EXISTING TUBE  08/13/2019    Social History:  reports that he has quit smoking. His smokeless tobacco use includes chew. He reports current alcohol use. He reports that he does not use drugs.  Family History:  Family History  Problem Relation Age of Onset   CAD Mother    Colon cancer Father      Prior to Admission medications   Medication Sig Start Date End Date Taking? Authorizing Provider  Ascorbic Acid (VITAMIN C PO) Take 1 tablet by mouth daily with breakfast.   Yes [provider]  atorvastatin (LIPITOR) 40 MG tablet Take 1 tablet (40 mg total) by mouth daily with supper. 02/27/20  Yes Enzo Bi, MD  carvedilol (  COREG) 3.125 MG tablet Take 1 tablet (3.125 mg total) by mouth 2 (two) times daily with a meal. 12/07/20  Yes Fritzi Mandes, MD  digoxin (LANOXIN) 0.125 MG tablet Take 0.0625 mg by mouth daily.   Yes [provider]  famotidine (PEPCID) 20 MG tablet Take 20 mg by mouth at bedtime. 02/04/19  Yes [provider]  ferrous sulfate 325 (65 FE) MG tablet Take 1 tablet (325 mg total) by mouth every Monday, Wednesday, and Friday. With supper 02/29/20  Yes Enzo Bi, MD  midodrine (PROAMATINE) 10 MG tablet Take 1 tablet (10 mg total) by mouth 3 (three) times daily with meals. 12/07/20  Yes Fritzi Mandes, MD  Multiple Vitamin (MULTIVITAMIN) tablet Take 1 tablet by mouth daily.   Yes [provider]  pantoprazole (PROTONIX) 40 MG tablet Take 1 tablet (40 mg total) by mouth 2 (two) times daily. 08/17/20  Yes Lorella Nimrod, MD  polyethylene glycol (MIRALAX / GLYCOLAX) 17 g packet Take 17 g by mouth  daily as needed for mild constipation. 08/17/20  Yes Lorella Nimrod, MD  ranolazine (RANEXA) 1000 MG SR tablet Take 500 mg by mouth 2 (two) times daily.  01/16/19  Yes [provider]  sucralfate (CARAFATE) 1 G tablet Take 1 g by mouth 2 (two) times daily.    Yes [provider]  tamsulosin (FLOMAX) 0.4 MG CAPS capsule Take 0.4 mg by mouth daily after supper.    Yes [provider]  torsemide (DEMADEX) 20 MG tablet Take 1 tablet (20 mg total) by mouth daily. 12/08/20  Yes Fritzi Mandes, MD  vitamin B-12 (CYANOCOBALAMIN) 500 MCG tablet Take 500 mcg by mouth daily.   Yes [provider]  acetaminophen (TYLENOL) 500 MG tablet Take 500 mg by mouth 2 (two) times daily as needed for mild pain or moderate pain.    [provider]    Physical Exam: Vitals:   12/09/20 1100 12/09/20 1130 12/09/20 1237 12/09/20 1521  BP: 96/65 (!) 105/59 95/64 (!) 98/50  Pulse: 89 (!) 103 (!) 104 83  Resp: 19 (!) 25 17 18   Temp:   (!) 97.3 F (36.3 C) 97.8 F (36.6 C)  TempSrc:   Temporal Oral  SpO2: 92% 92% 96% 97%  Weight:   73.5 kg   Height:   6\' 4"  (1.93 m)    General: Not in acute distress HEENT:       Eyes: PERRL, EOMI, no scleral icterus.       ENT: No discharge from the ears and nose, no pharynx injection, no tonsillar enlargement.        Neck: No JVD, no bruit, no mass felt. Heme: No neck lymph node enlargement. Cardiac: S1/S2, RRR, No murmurs, No gallops or rubs. Respiratory: No rales, wheezing, rhonchi or rubs. GI: Soft, nondistended, has tenderness in central abdomen, no rebound pain, no organomegaly, BS present. GU: No hematuria Ext: has trace leg edema bilaterally. 1+DP/PT pulse bilaterally. Musculoskeletal: No joint deformities, No joint redness or warmth, no limitation of ROM in spin. Skin: No rashes.  Neuro: Alert, oriented X3, cranial nerves II-XII grossly intact, moves all extremities normally.  Psych: Patient is not psychotic, no suicidal or hemocidal  ideation.  Labs on Admission: I have personally reviewed following labs and imaging studies  CBC: Recent Labs  Lab 12/03/20 0403 12/04/20 0528 12/09/20 0258 12/09/20 0759 12/09/20 1505  WBC 6.3  --  10.8* 12.8* 9.1  NEUTROABS  --   --  4.8  --   --  HGB 7.8* 7.8* 8.8* 7.8* 8.1*  HCT 23.7* 24.2* 26.6* 23.9* 24.0*  MCV 101.3*  --  98.5 100.0 103.4*  PLT 103*  --  140* 160 962*   Basic Metabolic Panel: Recent Labs  Lab 12/03/20 0403 12/04/20 0528 12/05/20 0553 12/07/20 0544 12/09/20 0258 12/09/20 0759  NA 127* 127* 131* 134* 135  --   K 4.8 3.8 3.3* 3.6 3.1*  --   CL 96* 93* 97* 94* 96*  --   CO2 23 24 25 27 26   --   GLUCOSE 132* 100* 90 96 124*  --   BUN 73* 94* 94* 94* 97*  --   CREATININE 2.74* 2.79* 2.73* 2.38* 2.35*  --   CALCIUM 8.3* 8.5* 8.8* 9.1 9.3  --   MG  --   --   --   --   --  1.7   GFR: Estimated Creatinine Clearance: 23 mL/min (A) (by C-G formula based on SCr of 2.35 mg/dL (H)). Liver Function Tests: Recent Labs  Lab 12/09/20 0258  AST 34  ALT 28  ALKPHOS 56  BILITOT 1.6*  PROT 7.1  ALBUMIN 4.3   No results for input(s): LIPASE, AMYLASE in the last 168 hours. No results for input(s): AMMONIA in the last 168 hours. Coagulation Profile: Recent Labs  Lab 12/09/20 0258  INR 1.2   Cardiac Enzymes: No results for input(s): CKTOTAL, CKMB, CKMBINDEX, TROPONINI in the last 168 hours. BNP (last 3 results) No results for input(s): PROBNP in the last 8760 hours. HbA1C: No results for input(s): HGBA1C in the last 72 hours. CBG: No results for input(s): GLUCAP in the last 168 hours. Lipid Profile: Recent Labs    12/09/20 0258  CHOL 71  HDL 27*  LDLCALC 30  TRIG 70  CHOLHDL 2.6   Thyroid Function Tests: No results for input(s): TSH, T4TOTAL, FREET4, T3FREE, THYROIDAB in the last 72 hours. Anemia Panel: No results for input(s): VITAMINB12, FOLATE, FERRITIN, TIBC, IRON, RETICCTPCT in the last 72 hours. Urine analysis:    Component Value  Date/Time   COLORURINE YELLOW (A) 12/09/2020 0336   APPEARANCEUR CLEAR (A) 12/09/2020 0336   LABSPEC 1.010 12/09/2020 0336   PHURINE 5.0 12/09/2020 0336   GLUCOSEU NEGATIVE 12/09/2020 0336   HGBUR NEGATIVE 12/09/2020 0336   BILIRUBINUR NEGATIVE 12/09/2020 0336   KETONESUR NEGATIVE 12/09/2020 0336   PROTEINUR NEGATIVE 12/09/2020 0336   UROBILINOGEN 0.2 09/11/2010 0647   NITRITE NEGATIVE 12/09/2020 0336   LEUKOCYTESUR NEGATIVE 12/09/2020 0336   Sepsis Labs: @LABRCNTIP (procalcitonin:4,lacticidven:4) ) Recent Results (from the past 240 hour(s))  Body fluid culture w Gram Stain     Status: None   Collection Time: 12/05/20 11:17 AM   Specimen: PATH Cytology Pleural fluid  Result Value Ref Range Status   Specimen Description   Final    PLEURAL Performed at [facility], [address] [place], [place] [address]    Special Requests   Final    PLEURAL Performed at [facility], [address] [place], [place] [address]    Gram Stain NO WBC SEEN NO ORGANISMS SEEN   Final   Culture   Final    NO GROWTH Performed at [facility], [place] [address] [place], [place] [address]    Report Status 12/08/2020 FINAL  Final  Resp Panel by RT-PCR (Flu A&B, Covid) Nasopharyngeal Swab     Status: None   Collection Time: 12/09/20  3:24 AM   Specimen: Nasopharyngeal Swab; Nasopharyngeal(NP) swabs in vial transport  medium  Result Value Ref Range Status   SARS Coronavirus 2 by RT PCR NEGATIVE NEGATIVE Final    Comment: (NOTE) SARS-CoV-2 target nucleic acids are NOT DETECTED.  The SARS-CoV-2 RNA is generally detectable in upper respiratory specimens during the acute phase of infection. The lowest concentration of SARS-CoV-2 viral copies this assay can detect is 138 copies/mL. A negative result does not preclude SARS-Cov-2 infection and should not be used as the sole basis for treatment or other patient management decisions. A negative result may occur with  improper  specimen collection/handling, submission of specimen other than nasopharyngeal swab, presence of viral mutation(s) within the areas targeted by this assay, and inadequate number of viral copies(<138 copies/mL). A negative result must be combined with clinical observations, patient history, and epidemiological information. The expected result is Negative.  Fact Sheet for Patients:  EntrepreneurPulse.com.au  Fact Sheet for Healthcare Providers:  IncredibleEmployment.be  This test is no t yet approved or cleared by the [place] FDA and  has been authorized for detection and/or diagnosis of SARS-CoV-2 by FDA under an Emergency Use Authorization (EUA). This EUA will remain  in effect (meaning this test can be used) for the duration of the COVID-19 declaration under Section 564(b)(1) of the Act, 21 U.S.C.section 360bbb-3(b)(1), unless the authorization is terminated  or revoked sooner.       Influenza A by PCR NEGATIVE NEGATIVE Final   Influenza B by PCR NEGATIVE NEGATIVE Final    Comment: (NOTE) The Xpert Xpress SARS-CoV-2/FLU/RSV plus assay is intended as an aid in the diagnosis of influenza from Nasopharyngeal swab specimens and should not be used as a sole basis for treatment. Nasal washings and aspirates are unacceptable for Xpert Xpress SARS-CoV-2/FLU/RSV testing.  Fact Sheet for Patients: EntrepreneurPulse.com.au  Fact Sheet for Healthcare Providers: IncredibleEmployment.be  This test is not yet approved or cleared by the [place] FDA and has been authorized for detection and/or diagnosis of SARS-CoV-2 by FDA under an Emergency Use Authorization (EUA). This EUA will remain in effect (meaning this test can be used) for the duration of the COVID-19 declaration under Section 564(b)(1) of the Act, 21 U.S.C. section 360bbb-3(b)(1), unless the authorization is terminated or revoked.  Performed at  [facility], [address] [place], [place] [address]      Radiological Exams on Admission: CT ABDOMEN PELVIS WO CONTRAST  Result Date: 12/09/2020 CLINICAL DATA:  Abdominal pain, acute (Ped 0-18y) EXAM: CT ABDOMEN AND PELVIS WITHOUT CONTRAST TECHNIQUE: Multidetector CT imaging of the abdomen and pelvis was performed following the standard protocol without IV contrast. COMPARISON:  12/05/2020, MRI June 2017 FINDINGS: Lower chest: Decreased but persistent bilateral pleural effusions, right greater than left. Adjacent bibasilar atelectasis. Unchanged cardiomegaly. Hepatobiliary: No focal liver abnormality is seen. Prior cholecystectomy. No biliary dilation. Pancreas: Mildly atrophic. No pancreatic ductal dilatation or surrounding inflammatory changes. Spleen: Normal in size without focal abnormality. Adrenals/Urinary Tract: Adrenal glands are unremarkable. Unchanged nonobstructive 2-3 mm left upper pole renal stone. The previously seen punctate right renal stone is not well seen likely due to slice selection. Unchanged bilateral renal cysts which are incompletely evaluated without intravenous contrast. The largest again measures 2.8 cm in the lateral upper pole of the left kidney. There is no hydronephrosis. The bladder is unremarkable. Stomach/Bowel: The stomach is within normal limits. There is no evidence of bowel obstruction. Postsurgical changes of right hemicolectomy with ileocolonic anastomosis. Moderate colonic stool burden. Vascular/Lymphatic: Aortoiliac atherosclerotic calcifications. No AAA. Reproductive: Unremarkable. Other: No abdominopelvic ascites.  No hernia. Musculoskeletal: Multilevel degenerative changes of the spine, moderate to severe in the lumbar spine. Unchanged grade 1 anterolisthesis at L4-L5. There are bilateral hip degenerative changes with unchanged mineralization. There is a lucent lesion within the left femoral neck compatible with a benign intraosseous lipoma, as  seen on prior MRI in June 2017. IMPRESSION: No acute abdominopelvic abnormality. Unchanged nonobstructive 2-3 mm left upper pole renal stone. Decreased but persistent bilateral pleural effusions, right greater than left with adjacent bibasilar atelectasis. Electronically Signed   By: Maurine Simmering   On: 12/09/2020 12:49   DG Chest Port 1 View  Result Date: 12/09/2020 CLINICAL DATA:  Weakness and dizziness. EXAM: PORTABLE CHEST 1 VIEW COMPARISON:  December 05, 2020 FINDINGS: Multiple sternal wires and vascular clips are seen. Stable, diffusely increased interstitial lung markings are noted. There is no evidence of a pleural effusion or pneumothorax. The cardiac silhouette is enlarged and unchanged in size. A chronic sixth left rib fracture is seen. Degenerative changes seen throughout the thoracic spine. IMPRESSION: Stable exam without significant interval change when compared to the prior chest plain film, dated December 05, 2020. Electronically Signed   By: Virgina Norfolk M.D.   On: 12/09/2020 03:41     EKG: I have personally reviewed. Atrial fibrillation, QTC 537, ST elevation in V2-V5, QRS widening, poor R wave progression   Assessment/Plan Principal Problem:   GI bleed Active Problems:   CAD (coronary artery disease)   HTN (hypertension)   Chronic atrial fibrillation (HCC)   Iron deficiency anemia due to chronic blood loss   Elevated troponin   BPH (benign prostatic hyperplasia)   Gastroesophageal reflux disease with esophagitis   HLD (hyperlipidemia)   CKD (chronic kidney disease), stage IIIb   Generalized weakness   MDS (myelodysplastic syndrome), low grade (HCC)   Chronic combined systolic and diastolic congestive heart failure (HCC)   GI bleed:  Hgb 8.7 on 7/20 --> 8.8 --> 7.8. Dr. Vicente Males of card is consulted. EGD showed multiple AVMS's in the stomach and small bowel that were ablated with APC.  - Placed on progressive bed for observation - IVF: 500 cc of NS bolus - Start IV  pantoprazole gtt - Zofran IV for nausea - Avoid NSAIDs and SQ heparin - Maintain IV access (2 large bore IVs if possible). - Monitor closely and follow q6h cbc, transfuse as necessary, if Hgb<7.0 - LaB: INR, PTT and type screen  CAD (coronary artery disease) and elevated trop: Troponin level 88, 78, 73, 189.  I consulted Dr. Ubaldo Glassing of cardiology, who thinks this is demand ischemia, not candidate for heparin or cardiac catheter due to risk of bleeding. -Trend troponin - Check A1c, FLP - Lipitor, Ranexa -No aspirin due to GI bleeding  HTN (hypertension): -Hold blood pressure medications due to hypotension, including Coreg, torsemide -Also hold Flomax  Chronic atrial fibrillation [facility]): Heart rate 113, 65, 76 -Hold Coreg -Continue digoxin  Iron deficiency anemia due to chronic blood loss: -Continue iron supplement  BPH (benign prostatic hyperplasia) -Hold Flomax due to hypotension  Gastroesophageal reflux disease with esophagitis -On IV Protonix drip  HLD (hyperlipidemia) -Lipitor  CKD (chronic kidney disease), stage IIIb: Patient had baseline creatinine 1.5-1.8 before, but he developed worsening renal function in previous admission.  At discharge, patient had creatinine 2.38, today his creatinine is 2.35, which is close to recent baseline. -Follow-up with BMP  Generalized weakness: Likely due to multifactorial etiology as above -PT/OT  MDS (myelodysplastic syndrome), low grade ([place]) -Follow-up with hematologist  Chronic combined systolic and diastolic congestive heart failure ([place]): 2D echo on 10/04/2020 showed EF 25-30% with grade 2 diastolic dysfunction.  BNP is elevated 1091, patient has trace leg edema, no pulm edema on chest x-ray, oxygen saturation 91-98% on room air, does not seem to have CHF exacerbation, however patient is at high risk of developing CHF exacerbation. -Watch volume status closely -Hold torsemide due to hypotension  Hypotension: Blood pressure 73/47,  which improved to 95/55.  Does not have signs of infection, less likely to have sepsis.  Likely multifactorial etiology including GI bleeding, volume depletion and continuation of blood pressure medications  and diuretics. -Hold blood pressure medications -Continue home midodrine 10 mg 3 times daily  Addendum: RN reports that pt was noted to have jerking in arms and legs.  Etiology is not clear. -WILL get MRI of her brain to rule out stroke -EEG -Seizure precaution -When necessary Ativan for seizure     DVT ppx: SCD Code Status: Full code Family Communication:   Yes, patient's daughter at bed side Disposition Plan:  Anticipate discharge back to previous environment Consults called:  Dr. Vicente Males Admission status and Level of care: Progressive Cardiac:    for obs     Status is: Observation  The patient remains OBS appropriate and will d/c before 2 midnights.  Dispo: The patient is from: Home              Anticipated d/c is to: Home              Patient currently is not medically stable to d/c.   Difficult to place patient No          Date of Service 12/09/2020    Ivor Costa [facility]   If 7PM-7AM, please contact night-coverage [email or web address] 12/09/2020, 4:44 PM

## 2020-12-09 NOTE — Op Note (Addendum)
[facility] Patient Name: Andres Gutierrez Procedure Date: 12/09/2020 1:24 PM MRN: [id number] Account #: [id number] Date of Birth: 03-09-1931 Admit Type: Inpatient Age: 87 Room: [facility] ENDO ROOM 4 Gender: Male Note Status: Finalized Procedure:             Small bowel enteroscopy Indications:           Melena Providers:             [name] MD, MD Referring MD:          Rusty Aus, MD (Referring MD) Medicines:             Monitored Anesthesia Care Complications:         No immediate complications. Procedure:             Pre-Anesthesia Assessment:                        - Prior to the procedure, a History and Physical was                         performed, and patient medications, allergies and                         sensitivities were reviewed. The patient's tolerance                         of previous anesthesia was reviewed.                        - The risks and benefits of the procedure and the                         sedation options and risks were discussed with the                         patient. All questions were answered and informed                         consent was obtained.                        - ASA Grade Assessment: III - A patient with severe                         systemic disease.                        After obtaining informed consent, the endoscope was                         passed under direct vision. Throughout the procedure,                         the patient's blood pressure, pulse, and oxygen                         saturations were monitored continuously. The                         Colonoscope was introduced through the mouth and  advanced to the proximal jejunum. The small bowel                         enteroscopy was accomplished with ease. The patient                         tolerated the procedure well. Findings:      There was no evidence of significant pathology in the proximal  jejunum.      Three angioectasias with no bleeding were found in the third portion of       the duodenum. Coagulation for bleeding prevention using argon beam at       0.5 liters/minute and 20 watts was successful.      Three 5 to 10 mm angioectasias with no bleeding were found in the       gastric fundus. Coagulation for bleeding prevention using argon beam at       0.5 liters/minute and 20 watts was successful. Impression:            - The examined portion of the jejunum was normal.                        - Three non-bleeding angioectasias in the duodenum.                         Treated with argon beam coagulation.                        - Three non-bleeding angioectasias in the stomach.                         Treated with argon beam coagulation.                        - No specimens collected. Recommendation:        - Return patient to hospital ward for ongoing care.                        - Clear liquid diet today.                        - Contionue Prilosec 40 mg BID Procedure Code(s):     --- Professional ---                        [phone], Small intestinal endoscopy, enteroscopy beyond                         second portion of duodenum, not including ileum; with                         control of bleeding (eg, injection, bipolar cautery,                         unipolar cautery, laser, heater probe, stapler, plasma                         coagulator) Diagnosis Code(s):     --- Professional ---  K31.819, Angiodysplasia of stomach and duodenum                         without bleeding                        K92.1, Melena (includes Hematochezia) CPT copyright 2019 American [employer] Association. All rights reserved. The codes documented in this report are preliminary and upon coder review may  be revised to meet current compliance requirements. [name], MD [name] MD, MD 12/09/2020 2:01:56 PM This report has been signed electronically. Number of Addenda:  0 Note Initiated On: 12/09/2020 1:24 PM Estimated Blood Loss:  Estimated blood loss: none.      [facility]

## 2020-12-09 NOTE — H&P (Signed)
Andres Bellows, MD [address], [place], [place], [place], [address] [address] [place], [place], [place], [address] Phone: [phone]  Fax: [phone]  Primary Care Physician:  Rusty Aus, MD   Pre-Procedure History & Physical: HPI:  Andres Gutierrez is a 87 y.o. male is here for an endoscopy    Past Medical History:  Diagnosis Date   Acalculous cholecystitis 10/29/2015   Acute on chronic heart failure ([place]) 06/07/2019   Anemia    Anginal pain (HCC)    Anxiety    Atrial fibrillation with RVR ([place]) 12/23/2015   Cardiomyopathy ([place])    Cerebral amyloid angiopathy (HCC)    CHF (congestive heart failure) ([place])    Chronic kidney disease    kidney stones   Coronary artery disease    Cough    Dyspnea    GERD (gastroesophageal reflux disease)    GERD (gastroesophageal reflux disease) 12/23/2015   History of kidney stones    Hypertension    Lymphadenopathy, hilar    MDS (myelodysplastic syndrome) (HCC)    MI (myocardial infarction) ([place])    x 2   Wheezing     Past Surgical History:  Procedure Laterality Date   BRONCHIAL NEEDLE ASPIRATION BIOPSY N/A 12/31/2014   Procedure: BRONCHIAL NEEDLE ASPIRATION BIOPSIES from carina;  Surgeon: Flora Lipps, MD;  Location: [facility] ORS;  Service: Cardiopulmonary;  Laterality: N/A;   CARDIAC CATHETERIZATION N/A 12/28/2015   Procedure: Left Heart Cath and Cors/Grafts Angiography;  Surgeon: Yolonda Kida, MD;  Location: [facility];  Service: Cardiovascular;  Laterality: N/A;   CATARACT EXTRACTION W/PHACO Left 08/01/2016   Procedure: CATARACT EXTRACTION PHACO AND INTRAOCULAR LENS PLACEMENT ([place]) Left;  Surgeon: Leandrew Koyanagi, MD;  Location: [facility];  Service: Ophthalmology;  Laterality: Left;   CHOLECYSTECTOMY     COLON SURGERY     CORONARY ANGIOPLASTY WITH STENT PLACEMENT     CORONARY ARTERY BYPASS GRAFT     CYSTOSCOPY/URETEROSCOPY/HOLMIUM LASER/STENT PLACEMENT Left 03/18/2020   Procedure:  CYSTOSCOPY/URETEROSCOPY/HOLMIUM LASER/STENT PLACEMENT;  Surgeon: Billey Co, MD;  Location: [facility] OR[facility];  Service: Urology;  Laterality: Left;   ENDOBRONCHIAL ULTRASOUND N/A 12/31/2014   Procedure: ENDOBRONCHIAL ULTRASOUND;  Surgeon: Flora Lipps, MD;  Location: [facility] OR[facility];  Service: Cardiopulmonary;  Laterality: N/A;   ESOPHAGOGASTRODUODENOSCOPY N/A 08/15/2020   Procedure: ESOPHAGOGASTRODUODENOSCOPY (EGD);  Surgeon: Toledo, Benay Pike, MD;  Location: [facility] ENDOSCOPY;  Service: Gastroenterology;  Laterality: N/A;   ESOPHAGOGASTRODUODENOSCOPY (EGD) WITH PROPOFOL N/A 06/20/2015   Procedure: ESOPHAGOGASTRODUODENOSCOPY (EGD) WITH PROPOFOL;  Surgeon: Lollie Sails, MD;  Location: [facility] ENDOS[facility];  Service: Endoscopy;  Laterality: N/A;   IR CATHETER TUBE CHANGE  10/26/2019   IR CHOLANGIOGRAM EXISTING TUBE  08/13/2019    Prior to Admission medications   Medication Sig Start Date End Date Taking? Authorizing Provider  Ascorbic Acid (VITAMIN C PO) Take 1 tablet by mouth daily with breakfast.   Yes [provider]  atorvastatin (LIPITOR) 40 MG tablet Take 1 tablet (40 mg total) by mouth daily with supper. 02/27/20  Yes Enzo Bi, MD  carvedilol (COREG) 3.125 MG tablet Take 1 tablet (3.125 mg total) by mouth 2 (two) times daily with a meal. 12/07/20  Yes Fritzi Mandes, MD  digoxin (LANOXIN) 0.125 MG tablet Take 0.0625 mg by mouth daily.   Yes [provider]  famotidine (PEPCID) 20 MG tablet Take 20 mg by mouth at bedtime. 02/04/19  Yes [provider]  ferrous sulfate 325 (65 FE) MG tablet Take  1 tablet (325 mg total) by mouth every Monday, Wednesday, and Friday. With supper 02/29/20  Yes Enzo Bi, MD  midodrine (PROAMATINE) 10 MG tablet Take 1 tablet (10 mg total) by mouth 3 (three) times daily with meals. 12/07/20  Yes Fritzi Mandes, MD  Multiple Vitamin (MULTIVITAMIN) tablet Take 1 tablet by mouth daily.   Yes [provider]  pantoprazole (PROTONIX) 40 MG tablet Take 1 tablet  (40 mg total) by mouth 2 (two) times daily. 08/17/20  Yes Lorella Nimrod, MD  polyethylene glycol (MIRALAX / GLYCOLAX) 17 g packet Take 17 g by mouth daily as needed for mild constipation. 08/17/20  Yes Lorella Nimrod, MD  ranolazine (RANEXA) 1000 MG SR tablet Take 500 mg by mouth 2 (two) times daily.  01/16/19  Yes [provider]  sucralfate (CARAFATE) 1 G tablet Take 1 g by mouth 2 (two) times daily.    Yes [provider]  tamsulosin (FLOMAX) 0.4 MG CAPS capsule Take 0.4 mg by mouth daily after supper.    Yes [provider]  torsemide (DEMADEX) 20 MG tablet Take 1 tablet (20 mg total) by mouth daily. 12/08/20  Yes Fritzi Mandes, MD  vitamin B-12 (CYANOCOBALAMIN) 500 MCG tablet Take 500 mcg by mouth daily.   Yes [provider]  acetaminophen (TYLENOL) 500 MG tablet Take 500 mg by mouth 2 (two) times daily as needed for mild pain or moderate pain.    [provider]    Allergies as of 12/09/2020 - Review Complete 12/09/2020  Allergen Reaction Noted   Levaquin [levofloxacin] Other (See Comments) 07/29/2019   Escitalopram Other (See Comments) 08/02/2015   5ht3 receptor antagonists Other (See Comments) 01/24/2016   Diltiazem Rash 12/26/2014   Diphenhydramine hcl Other (See Comments) 08/16/2015   Maxidex [dexamethasone] Other (See Comments) 06/17/2015   Prednisone Other (See Comments) 12/30/2014   Serotonin Other (See Comments) 06/23/2015   Vytorin [ezetimibe-simvastatin] Other (See Comments) 06/17/2015   Zocor [simvastatin] Other (See Comments) 06/17/2015    Family History  Problem Relation Age of Onset   CAD Mother    Colon cancer Father     Social History   Socioeconomic History   Marital status: Widowed    Spouse name: Not on file   Number of children: 2   Years of education: college   Highest education level: Some college, no degree  Occupational History   Occupation: retired  Tobacco Use   Smoking status: Former   Smokeless tobacco:  Current    Types: Nurse, children's Use: Never used  Substance and Sexual Activity   Alcohol use: Yes    Comment: occational almost rare once a year   Drug use: No   Sexual activity: Not Currently    Birth control/protection: Abstinence  Other Topics Concern   Not on file  Social History Narrative   Not on file   Social Determinants of Health   Financial Resource Strain: Not on file  Food Insecurity: Not on file  Transportation Needs: Not on file  Physical Activity: Not on file  Stress: Not on file  Social Connections: Not on file  Intimate Partner Violence: Not on file    Review of Systems: See HPI, otherwise negative ROS  Physical Exam: BP 95/64   Pulse (!) 104   Temp (!) 97.3 F (36.3 C) (Temporal)   Resp 17   Ht 6\' 4"  (1.93 m)   Wt 73.5 kg   SpO2 96%   BMI 19.72 kg/m  General:   Alert,  pleasant and cooperative in NAD Head:  Normocephalic and atraumatic. Neck:  Supple; no masses or thyromegaly. Lungs:  Clear throughout to auscultation, normal respiratory effort.    Heart:  +S1, +S2, irregularly irregular No edema. Abdomen:  Soft, nontender and nondistended. Normal bowel sounds, without guarding, and without rebound.   Neurologic:  Alert and  oriented x4;  grossly normal neurologically.  Impression/Plan: Andres Gutierrez is here for an endoscopy  to be performed for  evaluation of push enteroscopy to evaluate for melena     Risks, benefits, limitations, and alternatives regarding endoscopy have been reviewed with the patient.  Questions have been answered.  All parties agreeable.   Andres Bellows, MD  12/09/2020, 1:23 PM

## 2020-12-09 NOTE — ED Triage Notes (Signed)
Pt presents to ER with son.  Per son, pt was Dc'd from hospital on 7/27 after 12 day stay.  Son states pt has been c/o more weakness than normal and believes he has had a stroke.  No unilateral weakness noted, but pt does state he has some dizziness.  Pt A&O x4 at this time with no other deficits noted.

## 2020-12-09 NOTE — ED Provider Notes (Signed)
[facility] Emergency Department Provider Note  ____________________________________________   Event Date/Time   First MD Initiated Contact with Patient 12/09/20 [phone]     (approximate)  I have reviewed the triage vital signs and the nursing notes.   HISTORY  Chief Complaint Weakness  Level 5 caveat:  history/ROS limited by acute/critical illness  HPI Andres Gutierrez is a 87 y.o. male with extensive medical history including extensive cardiovascular disease as listed below who was just discharged from the hospital 2 days ago for acute respiratory failure with CHF exacerbation.  He has had a CABG and stents in the past.  He presents by private vehicle with his son for evaluation of generalized weakness.  He is not able to provide much history with the son contributes.  The patient has continued to feel very weak.  He has also had some black and tarry stool over the last couple of days.  By the middle of the night tonight, he felt very poorly and weak in general and then he started to have some central sharp chest pain that he describes as very mild.  It was brief and is now completely gone but his son brought him in for evaluation.  Upon arrival to triage, an EKG was performed that was concerning for STEMI .  My ED colleague evaluated the EKG and asked the staff to place a code STEMI alert and the patient was brought to my room.  Upon my initial assessment, the patient said he has no pain and a little bit of shortness of breath that is not unusual for him.  He just feels very tired and weak all over.  He confirmed the history of recent black stools.  He is not able to provide additional history at this time.  However, both he and his son confirmed that the patient has a DNR order in place and the patient verbalized to me with witnesses in the room that he does not want chest compressions and does not want to be intubated should his heart stop or should he stop  breathing.     Past Medical History:  Diagnosis Date   Acalculous cholecystitis 10/29/2015   Acute on chronic heart failure ([place]) 06/07/2019   Anemia    Anginal pain (HCC)    Anxiety    Atrial fibrillation with RVR ([place]) 12/23/2015   Cardiomyopathy (HCC)    Cerebral amyloid angiopathy (HCC)    CHF (congestive heart failure) (HCC)    Chronic kidney disease    kidney stones   Coronary artery disease    Cough    Dyspnea    GERD (gastroesophageal reflux disease)    GERD (gastroesophageal reflux disease) 12/23/2015   History of kidney stones    Hypertension    Lymphadenopathy, hilar    MDS (myelodysplastic syndrome) (HCC)    MI (myocardial infarction) ([place])    x 2   Wheezing     Patient Active Problem List   Diagnosis Date Noted   Chronic combined systolic and diastolic congestive heart failure ([place]) 12/09/2020   Stool guaiac positive 12/09/2020   Acute hypoxemic respiratory failure ([place]) 11/29/2020   Acute on chronic combined systolic (congestive) and diastolic (congestive) heart failure ([place]) 11/27/2020   Acute exacerbation of CHF (congestive heart failure) ([place]) 11/26/2020   Hyperkalemia 11/26/2020   Acute renal failure superimposed on stage 3b chronic kidney disease ([place]) 11/26/2020   Hypomagnesemia 10/03/2020   Pleural effusion 10/03/2020   Chest pain 10/03/2020   GI  bleed 08/12/2020   MDS (myelodysplastic syndrome), low grade ([place]) 07/22/2020   History of 2019 novel coronavirus disease (COVID-19) 05/19/2020   Cerebral amyloid angiopathy ([place]) 03/05/2020   Abdominal pain 03/03/2020   HLD (hyperlipidemia) 03/03/2020   Blurry vision 03/03/2020   CKD (chronic kidney disease), stage IIIb 03/03/2020   Left ureteral stone 03/03/2020   Generalized weakness    Cerebral hemorrhage, nontraumatic ([place]) 03/02/2020   Chronic cholecystitis 11/06/2019   Malnutrition of moderate degree 07/30/2019   Acute neutrophilia 07/30/2019   Sepsis ([place]) 07/29/2019   Acute cholecystitis  07/29/2019   Foot infection    Weakness    NSTEMI (non-ST elevated myocardial infarction) ([place])    Orthostatic hypotension 01/07/2019   Symptomatic anemia 08/05/2018   Anemia of chronic kidney failure, stage 3 (moderate) ([place]) 08/05/2018   BPH (benign prostatic hyperplasia) 07/24/2018   Gastroesophageal reflux disease with esophagitis 07/24/2018   Thrombocytopenia ([place]) 07/24/2018   Primary osteoarthritis of both knees 08/22/2017   Medicare annual wellness visit, initial 16/11/3708   Chronic systolic CHF (congestive heart failure), NYHA class 3 ([place]) 11/27/2016   Chewing tobacco use 01/17/2016   Dyspnea on exertion 12/23/2015   Elevated troponin 12/23/2015   Iron deficiency anemia due to chronic blood loss 12/20/2015   Femoral neck fracture, left, closed, initial encounter 11/03/2015   Chronic atrial fibrillation (HCC)    Coronary artery disease involving native coronary artery of native heart without angina pectoris    B12 deficiency 08/03/2015   Erosive esophagitis 08/03/2015   Adenopathy    CAD (coronary artery disease) 12/22/2014   HTN (hypertension) 12/22/2014   Cardiomyopathy, ischemic 07/27/2014    Past Surgical History:  Procedure Laterality Date   BRONCHIAL NEEDLE ASPIRATION BIOPSY N/A 12/31/2014   Procedure: BRONCHIAL NEEDLE ASPIRATION BIOPSIES from carina;  Surgeon: Flora Lipps, MD;  Location: [facility] ORS;  Service: Cardiopulmonary;  Laterality: N/A;   CARDIAC CATHETERIZATION N/A 12/28/2015   Procedure: Left Heart Cath and Cors/Grafts Angiography;  Surgeon: Yolonda Kida, MD;  Location: [facility];  Service: Cardiovascular;  Laterality: N/A;   CATARACT EXTRACTION W/PHACO Left 08/01/2016   Procedure: CATARACT EXTRACTION PHACO AND INTRAOCULAR LENS PLACEMENT ([place]) Left;  Surgeon: Leandrew Koyanagi, MD;  Location: [facility];  Service: Ophthalmology;  Laterality: Left;   CHOLECYSTECTOMY     COLON SURGERY     CORONARY ANGIOPLASTY WITH STENT PLACEMENT      CORONARY ARTERY BYPASS GRAFT     CYSTOSCOPY/URETEROSCOPY/HOLMIUM LASER/STENT PLACEMENT Left 03/18/2020   Procedure: CYSTOSCOPY/URETEROSCOPY/HOLMIUM LASER/STENT PLACEMENT;  Surgeon: Billey Co, MD;  Location: [facility] OR[facility];  Service: Urology;  Laterality: Left;   ENDOBRONCHIAL ULTRASOUND N/A 12/31/2014   Procedure: ENDOBRONCHIAL ULTRASOUND;  Surgeon: Flora Lipps, MD;  Location: [facility] OR[facility];  Service: Cardiopulmonary;  Laterality: N/A;   ESOPHAGOGASTRODUODENOSCOPY N/A 08/15/2020   Procedure: ESOPHAGOGASTRODUODENOSCOPY (EGD);  Surgeon: Toledo, Benay Pike, MD;  Location: [facility] ENDOSCOPY;  Service: Gastroenterology;  Laterality: N/A;   ESOPHAGOGASTRODUODENOSCOPY (EGD) WITH PROPOFOL N/A 06/20/2015   Procedure: ESOPHAGOGASTRODUODENOSCOPY (EGD) WITH PROPOFOL;  Surgeon: Lollie Sails, MD;  Location[facility];  Service: Endoscopy;  Laterality: N/A;   IR CATHETER TUBE CHANGE  10/26/2019   IR CHOLANGIOGRAM EXISTING TUBE  08/13/2019    Prior to Admission medications   Medication Sig Start Date End Date Taking? Authorizing Provider  Ascorbic Acid (VITAMIN C PO) Take 1 tablet by mouth daily with breakfast.   Yes [provider]  atorvastatin (LIPITOR) 40 MG tablet Take 1 tablet (40 mg total) by mouth daily with  supper. 02/27/20  Yes Enzo Bi, MD  carvedilol (COREG) 3.125 MG tablet Take 1 tablet (3.125 mg total) by mouth 2 (two) times daily with a meal. 12/07/20  Yes Fritzi Mandes, MD  digoxin (LANOXIN) 0.125 MG tablet Take 0.0625 mg by mouth daily.   Yes [provider]  famotidine (PEPCID) 20 MG tablet Take 20 mg by mouth at bedtime. 02/04/19  Yes [provider]  ferrous sulfate 325 (65 FE) MG tablet Take 1 tablet (325 mg total) by mouth every Monday, Wednesday, and Friday. With supper 02/29/20  Yes Enzo Bi, MD  midodrine (PROAMATINE) 10 MG tablet Take 1 tablet (10 mg total) by mouth 3 (three) times daily with meals. 12/07/20  Yes Fritzi Mandes, MD  Multiple Vitamin (MULTIVITAMIN)  tablet Take 1 tablet by mouth daily.   Yes [provider]  pantoprazole (PROTONIX) 40 MG tablet Take 1 tablet (40 mg total) by mouth 2 (two) times daily. 08/17/20  Yes Lorella Nimrod, MD  polyethylene glycol (MIRALAX / GLYCOLAX) 17 g packet Take 17 g by mouth daily as needed for mild constipation. 08/17/20  Yes Lorella Nimrod, MD  ranolazine (RANEXA) 1000 MG SR tablet Take 500 mg by mouth 2 (two) times daily.  01/16/19  Yes [provider]  sucralfate (CARAFATE) 1 G tablet Take 1 g by mouth 2 (two) times daily.    Yes [provider]  tamsulosin (FLOMAX) 0.4 MG CAPS capsule Take 0.4 mg by mouth daily after supper.    Yes [provider]  torsemide (DEMADEX) 20 MG tablet Take 1 tablet (20 mg total) by mouth daily. 12/08/20  Yes Fritzi Mandes, MD  vitamin B-12 (CYANOCOBALAMIN) 500 MCG tablet Take 500 mcg by mouth daily.   Yes [provider]  acetaminophen (TYLENOL) 500 MG tablet Take 500 mg by mouth 2 (two) times daily as needed for mild pain or moderate pain.    [provider]    Allergies Levaquin [levofloxacin], Escitalopram, 5ht3 receptor antagonists, Diltiazem, Diphenhydramine hcl, Maxidex [dexamethasone], Prednisone, Serotonin, Vytorin [ezetimibe-simvastatin], and Zocor [simvastatin]  Family History  Problem Relation Age of Onset   CAD Mother    Colon cancer Father     Social History Social History   Tobacco Use   Smoking status: Former   Smokeless tobacco: Current    Types: Nurse, children's Use: Never used  Substance Use Topics   Alcohol use: Yes    Comment: occational almost rare once a year   Drug use: No    Review of Systems Level 5 caveat:  history/ROS limited by acute/critical illness.  Positive for generalized weakness and mild transient chest pain. ____________________________________________   PHYSICAL EXAM:  VITAL SIGNS: ED Triage Vitals  Enc Vitals Group     BP 12/09/20 0246 (!) 86/59     Pulse Rate  12/09/20 0246 77     Resp 12/09/20 0246 18     Temp 12/09/20 0246 (!) 97.5 F (36.4 C)     Temp Source 12/09/20 0246 Oral     SpO2 12/09/20 0246 95 %     Weight 12/09/20 0247 73 kg (161 lb)     Height 12/09/20 0247 1.93 m (6\' 4" )     Head Circumference --      Peak Flow --      Pain Score 12/09/20 0253 2     Pain Loc --      Pain Edu? --      Excl. in [employer]? --  Constitutional: Alert and oriented.  Ill-appearing. Eyes: Conjunctivae are normal.  Head: Atraumatic. Nose: No congestion/rhinnorhea. Mouth/Throat: Patient is wearing a mask. Neck: No stridor.  No meningeal signs.   Cardiovascular: Borderline tachycardia with irregular rhythm. Good peripheral circulation. Respiratory: Normal respiratory effort.  No retractions.  Lungs are clear at this time. Gastrointestinal: Soft and nondistended.  Mild diffuse tenderness to palpation throughout the abdomen with no focal tenderness.  No bruits. Musculoskeletal: No lower extremity tenderness nor edema. No gross deformities of extremities. Neurologic:  Normal speech and language. No gross focal neurologic deficits are appreciated.  Skin:  Skin is warm, dry and intact.   ____________________________________________   LABS (all labs ordered are listed, but only abnormal results are displayed)  Labs Reviewed  URINALYSIS, COMPLETE (UACMP) WITH MICROSCOPIC - Abnormal; Notable for the following components:      Result Value   Color, Urine YELLOW (*)    APPearance CLEAR (*)    All other components within normal limits  CBC WITH DIFFERENTIAL/PLATELET - Abnormal; Notable for the following components:   WBC 10.8 (*)    RBC 2.70 (*)    Hemoglobin 8.8 (*)    HCT 26.6 (*)    RDW 20.5 (*)    Platelets 140 (*)    Monocytes Absolute 3.1 (*)    Abs Immature Granulocytes 0.16 (*)    All other components within normal limits  APTT - Abnormal; Notable for the following components:   aPTT 37 (*)    All other components within normal limits   COMPREHENSIVE METABOLIC PANEL - Abnormal; Notable for the following components:   Potassium 3.1 (*)    Chloride 96 (*)    Glucose, Bld 124 (*)    BUN 97 (*)    Creatinine, Ser 2.35 (*)    Total Bilirubin 1.6 (*)    GFR, Estimated 26 (*)    All other components within normal limits  LIPID PANEL - Abnormal; Notable for the following components:   HDL 27 (*)    All other components within normal limits  TROPONIN I (HIGH SENSITIVITY) - Abnormal; Notable for the following components:   Troponin I (High Sensitivity) 88 (*)    All other components within normal limits  TROPONIN I (HIGH SENSITIVITY) - Abnormal; Notable for the following components:   Troponin I (High Sensitivity) 78 (*)    All other components within normal limits  RESP PANEL BY RT-PCR (FLU A&B, COVID) ARPGX2  PROTIME-INR  HEMOGLOBIN A1C  BRAIN NATRIURETIC PEPTIDE  CBC  CBC  CBC  MAGNESIUM  TYPE AND SCREEN  TYPE AND SCREEN  TYPE AND SCREEN  TROPONIN I (HIGH SENSITIVITY)   ____________________________________________  EKG  ED ECG REPORT I, Hinda Kehr, the attending physician, personally viewed and interpreted this ECG.  Date: 12/09/2020 EKG Time: 2:51 AM Rate: 101 Rhythm: A. fib with borderline RVR QRS Axis: Right superior axis deviation Intervals: Nonspecific intraventricular block. ST/T Wave abnormalities: The patient has depression and QRS complex inversion in leads II, III, and aVF.  He has elevation in aVR which seems stable from prior EKGs.  However he has marked elevation of the ST segments in leads V2, V3, V4, with some depression in V5 and V6.  This is strongly suggestive of ischemia. Narrative Interpretation: EKG changes from prior although with similar morphology that are concerning for STEMI.  ED ECG REPORT I, Hinda Kehr, the attending physician, personally viewed and interpreted this ECG.  Date: 12/09/2020 EKG Time: 3:21 AM Rate: 84 Rhythm: Atrial fibrillation  QRS Axis:  normal Intervals: Nonspecific intraventricular conduction delay with LAD ST/T Wave abnormalities: Similar ST depression in 2, 3, and aVF to prior EKG, but with less marked changes in the anteroseptal and lateral leads. Narrative Interpretation: Does not clearly meet STEMI criteria though still concerning for ischemia.  ____________________________________________  RADIOLOGY I, Hinda Kehr, personally viewed and evaluated these images (plain radiographs) as part of my medical decision making, as well as reviewing the written report by the radiologist.  ED MD interpretation: Stable chest x-ray from prior without acute abnormality.  Official radiology report(s): DG Chest Port 1 View  Result Date: 12/09/2020 CLINICAL DATA:  Weakness and dizziness. EXAM: PORTABLE CHEST 1 VIEW COMPARISON:  December 05, 2020 FINDINGS: Multiple sternal wires and vascular clips are seen. Stable, diffusely increased interstitial lung markings are noted. There is no evidence of a pleural effusion or pneumothorax. The cardiac silhouette is enlarged and unchanged in size. A chronic sixth left rib fracture is seen. Degenerative changes seen throughout the thoracic spine. IMPRESSION: Stable exam without significant interval change when compared to the prior chest plain film, dated December 05, 2020. Electronically Signed   By: Virgina Norfolk M.D.   On: 12/09/2020 03:41    ____________________________________________   PROCEDURES   Procedure(s) performed (including Critical Care):  .1-3 Lead EKG Interpretation  Date/Time: 12/09/2020 7:59 AM Performed by: Hinda Kehr, MD Authorized by: Hinda Kehr, MD     Interpretation: abnormal     ECG rate:  102   ECG rate assessment: tachycardic     Rhythm: atrial fibrillation     Ectopy: PVCs     Conduction: normal   .Critical Care  Date/Time: 12/09/2020 7:59 AM Performed by: Hinda Kehr, MD Authorized by: Hinda Kehr, MD   Critical care provider statement:     Critical care time (minutes):  45   Critical care time was exclusive of:  Separately billable procedures and treating other patients   Critical care was necessary to treat or prevent imminent or life-threatening deterioration of the following conditions:  Circulatory failure   Critical care was time spent personally by me on the following activities:  Development of treatment plan with patient or surrogate, discussions with consultants, evaluation of patient's response to treatment, examination of patient, obtaining history from patient or surrogate, ordering and performing treatments and interventions, ordering and review of laboratory studies, ordering and review of radiographic studies, pulse oximetry, re-evaluation of patient's condition and review of old charts   ____________________________________________   [place] / MDM / [place] / ED COURSE  As part of my medical decision making, I reviewed the following data within the electronic MEDICAL RECORD NUMBER History obtained from family, Nursing notes reviewed and incorporated, Labs reviewed , EKG interpreted , Old EKG reviewed, Old chart reviewed, Discussed with admitting physician , A consult was requested and obtained from this/these consultant(s) Cardiology, and Notes from prior ED visits   Differential diagnosis includes, but is not limited to, ACS, demand ischemia, acute blood loss anemia, electrolyte or metabolic abnormality, PE.  The patient is on the cardiac monitor to evaluate for evidence of arrhythmia and/or significant heart rate changes.  The patient's presentation was concerning for STEMI.  As we were getting the patient settled then and before we provided aspirin and heparin, the patient reported the black and tarry stools.  I performed a rectal exam immediately and verified that the stool is dark, not quite melena, but is strongly heme positive.  I spoke by phone with Dr.  Paraschos who reviewed both today's  and the old EKGs along with me and we discussed the case in detail.  We agree that the presentation is not consistent with a STEMI given his lack of consistent chest pain.  Even though he show signs of ischemia on his EKG, the overall morphology is similar to his most recent EKGs with some changes suggestive of demand ischemia, but it is unlikely he would benefit from a risky procedure such as cardiac catheterization/PCI.  Dr. Saralyn Pilar recommends medical treatment for the meantime and to call him back should the patient develop chest pain.  I updated the patient and his son and they are in agreement.  Patient's initial troponin is elevated greater than his baseline, likely consistent with his event tonight.  No evidence of infection on urinalysis.  Mild leukocytosis of 10.8, stable hemoglobin for now.  Comprehensive metabolic panel unremarkable; his chronic kidney disease is at baseline and his potassium is low but I am not confident that he could swallow a potassium supplement at this time.  I will defer treatment.     Clinical Course as of 12/09/20 0803  Fri Dec 09, 2020  0507 Consulting hospitalist [CF]  [phone] I called the lab and the CBC had some abnormalities that they are trying to verify manually.  However she told me that the patient's hemoglobin is greater than 8 which is reassuring and stable from prior. [CF]  [phone] Discussed case in person with Dr. Damita Dunnings who will admit. [CF]    Clinical Course User Index [CF] Hinda Kehr, MD     ____________________________________________  FINAL CLINICAL IMPRESSION(S) / ED DIAGNOSES  Final diagnoses:  Generalized weakness  Gastrointestinal hemorrhage, unspecified gastrointestinal hemorrhage type  Chest pain, unspecified type  Demand ischemia [facility])     MEDICATIONS GIVEN DURING THIS VISIT:  Medications  0.9 %  sodium chloride infusion (0 mLs Intravenous Hold 12/09/20 0327)  pantoprozole (PROTONIX) 80 mg /NS 100 mL infusion (8 mg/hr  Intravenous New Bag/Given 12/09/20 5701)  acetaminophen (TYLENOL) tablet 650 mg (has no administration in time range)  hydrOXYzine (ATARAX/VISTARIL) tablet 10 mg (has no administration in time range)  potassium chloride SA (KLOR-CON) CR tablet 40 mEq (has no administration in time range)  sodium chloride 0.9 % bolus 500 mL (0 mLs Intravenous Stopped 12/09/20 0652)  pantoprazole (PROTONIX) 80 mg /NS 100 mL IVPB (0 mg Intravenous Stopped 12/09/20 7793)     ED Discharge Orders     None        Note:  This document was prepared using Dragon voice recognition software and may include unintentional dictation errors.   Hinda Kehr, MD 12/09/20 [phone]

## 2020-12-09 NOTE — Anesthesia Preprocedure Evaluation (Addendum)
Anesthesia Evaluation  Patient identified by MRN, date of birth, ID band Patient awake    Reviewed: Allergy & Precautions, NPO status , Patient's Chart, lab work & pertinent test results  History of Anesthesia Complications Negative for: history of anesthetic complications  Airway Mallampati: II  TM Distance: >3 FB Neck ROM: Full    Dental   Poor dentition throughout - few teeth left:   Pulmonary neg sleep apnea, neg COPD, Not current smoker, former smoker,    + rhonchi  + decreased breath sounds(-) wheezing      Cardiovascular hypertension, Pt. on medications + CAD, + Past MI, + CABG and +CHF  Normal cardiovascular exam+ Valvular Problems/Murmurs AS and MR  Rhythm:Irregular Rate:Normal + Systolic murmurs- Diastolic murmurs 5/72/62 TTE: 1. Left ventricular ejection fraction, by estimation, is 25 to 30%. The  left ventricle has severely decreased function. The left ventricle  demonstrates global hypokinesis. The left ventricular internal cavity size  was moderately dilated. There is  moderate left ventricular hypertrophy. Left ventricular diastolic  parameters are consistent with Grade II diastolic dysfunction  (pseudonormalization).  2. Right ventricular systolic function is normal. The right ventricular  size is normal.  3. The mitral valve is normal in structure. Moderate mitral valve regurgitation.  4. The tricuspid valve is abnormal. Tricuspid valve regurgitation is moderate.  5. The aortic valve is normal in structure. Aortic valve regurgitation is mild.   Neuro/Psych neg Seizures Anxiety    GI/Hepatic Neg liver ROS, GERD  Medicated,  Endo/Other  neg diabetes  Renal/GU Renal InsufficiencyRenal disease     Musculoskeletal   Abdominal   Peds  Hematology  (+) anemia ,   Anesthesia Other Findings   Reproductive/Obstetrics                           Anesthesia Physical  Anesthesia  Plan  ASA: 4  Anesthesia Plan: MAC   Post-op Pain Management:    Induction:   PONV Risk Score and Plan: 2 and Propofol infusion and TIVA  Airway Management Planned: Nasal Cannula  Additional Equipment:   Intra-op Plan:   Post-operative Plan:   Informed Consent: I have reviewed the patients History and Physical, chart, labs and discussed the procedure including the risks, benefits and alternatives for the proposed anesthesia with the patient or authorized representative who has indicated his/her understanding and acceptance.     Dental advisory given  Plan Discussed with: CRNA and Anesthesiologist  Anesthesia Plan Comments: (IVGA, PIV x 1, ASA SM, RBO discussed, Consent signed.)       Anesthesia Quick Evaluation

## 2020-12-09 NOTE — Transfer of Care (Signed)
Immediate Anesthesia Transfer of Care Note  Patient: Andres Gutierrez  Procedure(s) Performed: ENTEROSCOPY  Patient Location: Endoscopy Unit  Anesthesia Type:MAC  Level of Consciousness: drowsy  Airway & Oxygen Therapy: Patient Spontanous Breathing and Patient connected to face mask oxygen  Post-op Assessment: Report given to RN and Post -op Vital signs reviewed and stable  Post vital signs: Reviewed and stable  Last Vitals:  Vitals Value Taken Time  BP 99/53 12/09/20 1403  Temp    Pulse 63 12/09/20 1404  Resp 19 12/09/20 1404  SpO2 100 % 12/09/20 1404  Vitals shown include unvalidated device data.  Last Pain:  Vitals:   12/09/20 1237  TempSrc: Temporal  PainSc: 0-No pain         Complications: No notable events documented.

## 2020-12-09 NOTE — Consult Note (Signed)
Andres Gutierrez , MD [address] [place], [place], [place], [address] [address], [place], [place], [place], [address] Phone: [phone]  Fax: [phone]  Consultation  Referring Provider:     Dr Blaine Hamper Primary Care Physician:  Rusty Aus, MD Primary Gastroenterologist:  Dr. Alice Reichert         Reason for Consultation:     Anemia  Date of Admission:  12/09/2020 Date of Consultation:  12/09/2020         HPI:   Andres Gutierrez is a 87 y.o. male who has previously been seen by Dr. Alice Reichert in the GI office office in April 2022 for iron deficiency anemia.  In April 2022 had duodenal angiectasia's that were treated with cautery when he was admitted with melena.    No recent colonoscopy.  He follows with Dr. Rogue Bussing for MDS and anemia  He was admitted earlier this morning for weakness and has had some black and tarry stools over the past couple of days.  He has had some central chest pain that was mild on admission there was some concern for a STEMI.  Seen by cardiology and cardiac work-up has been deferred at this point of time he was just discharged just 2 days back on 12/07/2020 with acute on chronic congestive heart failure and underwent thoracocentesis for right-sided pleural effusion  On admission hemoglobin was 8.8 g similar to what it was at discharge no significant elevation in BUN/creatinine ratio.  Troponin was elevated this morning at 78.  Hemoglobin this morning was 7.8 g.   11/27/2020 iron studies showed no gross evidence of iron deficiency, B12 levels were normal.  He says he has had on and off black colored stools, last episode was last night . Denies any blood thinners or NSAID use. Complains of lower abdominal pain since admission , gradually getting worse. Non radiating ,cramping in nature.   Past Medical History:  Diagnosis Date   Acalculous cholecystitis 10/29/2015   Acute on chronic heart failure ([facility]) 06/07/2019   Anemia    Anginal pain (HCC)    Anxiety    Atrial  fibrillation with RVR ([facility]) 12/23/2015   Cardiomyopathy ([facility])    Cerebral amyloid angiopathy (HCC)    CHF (congestive heart failure) (HCC)    Chronic kidney disease    kidney stones   Coronary artery disease    Cough    Dyspnea    GERD (gastroesophageal reflux disease)    GERD (gastroesophageal reflux disease) 12/23/2015   History of kidney stones    Hypertension    Lymphadenopathy, hilar    MDS (myelodysplastic syndrome) (HCC)    MI (myocardial infarction) ([facility])    x 2   Wheezing     Past Surgical History:  Procedure Laterality Date   BRONCHIAL NEEDLE ASPIRATION BIOPSY N/A 12/31/2014   Procedure: BRONCHIAL NEEDLE ASPIRATION BIOPSIES from [facility];  Surgeon: Flora Lipps, MD;  Location: [facility];  Service: Cardiopulmonary;  Laterality: N/A;   CARDIAC CATHETERIZATION N/A 12/28/2015   Procedure: Left Heart Cath and Cors/Grafts Angiography;  Surgeon: Yolonda Kida, MD;  Location: [facility];  Service: Cardiovascular;  Laterality: N/A;   CATARACT EXTRACTION W/PHACO Left 08/01/2016   Procedure: CATARACT EXTRACTION PHACO AND INTRAOCULAR LENS PLACEMENT ([facility]) Left;  Surgeon: Leandrew Koyanagi, MD;  Location: [facility];  Service: Ophthalmology;  Laterality: Left;   CHOLECYSTECTOMY     COLON SURGERY     CORONARY ANGIOPLASTY WITH STENT PLACEMENT     CORONARY  ARTERY BYPASS GRAFT     CYSTOSCOPY/URETEROSCOPY/HOLMIUM LASER/STENT PLACEMENT Left 03/18/2020   Procedure: CYSTOSCOPY/URETEROSCOPY/HOLMIUM LASER/STENT PLACEMENT;  Surgeon: Billey Co, MD;  Location: [facility];  Service: Urology;  Laterality: Left;   ENDOBRONCHIAL ULTRASOUND N/A 12/31/2014   Procedure: ENDOBRONCHIAL ULTRASOUND;  Surgeon: Flora Lipps, MD;  Location: [facility];  Service: Cardiopulmonary;  Laterality: N/A;   ESOPHAGOGASTRODUODENOSCOPY N/A 08/15/2020   Procedure: ESOPHAGOGASTRODUODENOSCOPY (EGD);  Surgeon: Toledo, Benay Pike, MD;  Location: [facility];  Service: Gastroenterology;  Laterality: N/A;    ESOPHAGOGASTRODUODENOSCOPY (EGD) WITH PROPOFOL N/A 06/20/2015   Procedure: ESOPHAGOGASTRODUODENOSCOPY (EGD) WITH PROPOFOL;  Surgeon: Lollie Sails, MD;  Location: [facility];  Service: Endoscopy;  Laterality: N/A;   IR CATHETER TUBE CHANGE  10/26/2019   IR CHOLANGIOGRAM EXISTING TUBE  08/13/2019    Prior to Admission medications   Medication Sig Start Date End Date Taking? Authorizing Provider  Ascorbic Acid (VITAMIN C PO) Take 1 tablet by mouth daily with breakfast.   Yes [provider]  atorvastatin (LIPITOR) 40 MG tablet Take 1 tablet (40 mg total) by mouth daily with supper. 02/27/20  Yes Enzo Bi, MD  carvedilol (COREG) 3.125 MG tablet Take 1 tablet (3.125 mg total) by mouth 2 (two) times daily with a meal. 12/07/20  Yes Fritzi Mandes, MD  digoxin (LANOXIN) 0.125 MG tablet Take 0.0625 mg by mouth daily.   Yes [provider]  famotidine (PEPCID) 20 MG tablet Take 20 mg by mouth at bedtime. 02/04/19  Yes [provider]  ferrous sulfate 325 (65 FE) MG tablet Take 1 tablet (325 mg total) by mouth every Monday, Wednesday, and Friday. With supper 02/29/20  Yes Enzo Bi, MD  midodrine (PROAMATINE) 10 MG tablet Take 1 tablet (10 mg total) by mouth 3 (three) times daily with meals. 12/07/20  Yes Fritzi Mandes, MD  Multiple Vitamin (MULTIVITAMIN) tablet Take 1 tablet by mouth daily.   Yes [provider]  pantoprazole (PROTONIX) 40 MG tablet Take 1 tablet (40 mg total) by mouth 2 (two) times daily. 08/17/20  Yes Lorella Nimrod, MD  polyethylene glycol (MIRALAX / GLYCOLAX) 17 g packet Take 17 g by mouth daily as needed for mild constipation. 08/17/20  Yes Lorella Nimrod, MD  ranolazine (RANEXA) 1000 MG SR tablet Take 500 mg by mouth 2 (two) times daily.  01/16/19  Yes [provider]  sucralfate (CARAFATE) 1 G tablet Take 1 g by mouth 2 (two) times daily.    Yes [provider]  tamsulosin (FLOMAX) 0.4 MG CAPS capsule Take 0.4 mg by mouth daily after  supper.    Yes [provider]  torsemide (DEMADEX) 20 MG tablet Take 1 tablet (20 mg total) by mouth daily. 12/08/20  Yes Fritzi Mandes, MD  vitamin B-12 (CYANOCOBALAMIN) 500 MCG tablet Take 500 mcg by mouth daily.   Yes [provider]  acetaminophen (TYLENOL) 500 MG tablet Take 500 mg by mouth 2 (two) times daily as needed for mild pain or moderate pain.    [provider]    Family History  Problem Relation Age of Onset   CAD Mother    Colon cancer Father      Social History   Tobacco Use   Smoking status: Former   Smokeless tobacco: Current    Types: Chew  Vaping Use   Vaping Use: Never used  Substance Use Topics   Alcohol use: Yes    Comment: occational almost rare once a year   Drug use: No  Allergies as of 12/09/2020 - Review Complete 12/09/2020  Allergen Reaction Noted   Levaquin [levofloxacin] Other (See Comments) 07/29/2019   Escitalopram Other (See Comments) 08/02/2015   5ht3 receptor antagonists Other (See Comments) 01/24/2016   Diltiazem Rash 12/26/2014   Diphenhydramine hcl Other (See Comments) 08/16/2015   Maxidex [dexamethasone] Other (See Comments) 06/17/2015   Prednisone Other (See Comments) 12/30/2014   Serotonin Other (See Comments) 06/23/2015   Vytorin [ezetimibe-simvastatin] Other (See Comments) 06/17/2015   Zocor [simvastatin] Other (See Comments) 06/17/2015    Review of Systems:    All systems reviewed and negative except where noted in HPI.   Physical Exam:  Vital signs in last 24 hours: Temp:  [97.5 F (36.4 C)] 97.5 F (36.4 C) (07/29 0246) Pulse Rate:  [42-135] 42 (07/29 0800) Resp:  [15-26] 26 (07/29 0800) BP: (73-110)/(45-59) 86/55 (07/29 0800) SpO2:  [91 %-98 %] 91 % (07/29 0800) Weight:  [73 kg-73.5 kg] 73.5 kg (07/29 0254)   General:   Pleasant, cooperative in NAD Head:  Normocephalic and atraumatic. Eyes:   No icterus.   Conjunctiva pink. PERRLA. Ears:  Normal auditory acuity. Neck:  Supple; no  masses or thyroidomegaly Lungs: Respirations even and unlabored. Lungs clear to auscultation bilaterally.   No wheezes, crackles, or rhonchi.  Heart:  Rirregularly iregular ,   Without murmur, clicks, rubs or gallops Abdomen:  Soft, nondistended, nontender. Normal bowel sounds. No appreciable masses or hepatomegaly.  No rebound or guarding.  Neurologic:  Alert and oriented x3;  grossly normal neurologically. Skin:  Intact without significant lesions or rashes. Cervical Nodes:  No significant cervical adenopathy. Psych:  Alert and cooperative. Normal affect.  LAB RESULTS: Recent Labs    12/09/20 0258 12/09/20 0759  WBC 10.8* 12.8*  HGB 8.8* 7.8*  HCT 26.6* 23.9*  PLT 140* 160   BMET Recent Labs    12/07/20 0544 12/09/20 0258  NA 134* 135  K 3.6 3.1*  CL 94* 96*  CO2 27 26  GLUCOSE 96 124*  BUN 94* 97*  CREATININE 2.38* 2.35*  CALCIUM 9.1 9.3   LFT Recent Labs    12/09/20 0258  PROT 7.1  ALBUMIN 4.3  AST 34  ALT 28  ALKPHOS 56  BILITOT 1.6*   PT/INR Recent Labs    12/09/20 0258  LABPROT 15.1  INR 1.2    STUDIES: DG Chest Port 1 View  Result Date: 12/09/2020 CLINICAL DATA:  Weakness and dizziness. EXAM: PORTABLE CHEST 1 VIEW COMPARISON:  December 05, 2020 FINDINGS: Multiple sternal wires and vascular clips are seen. Stable, diffusely increased interstitial lung markings are noted. There is no evidence of a pleural effusion or pneumothorax. The cardiac silhouette is enlarged and unchanged in size. A chronic sixth left rib fracture is seen. Degenerative changes seen throughout the thoracic spine. IMPRESSION: Stable exam without significant interval change when compared to the prior chest plain film, dated December 05, 2020. Electronically Signed   By: Virgina Norfolk M.D.   On: 12/09/2020 03:41      Impression / Plan:   JAYE POLIDORI is a 87 y.o. y/o male with history of MDS, CHF prior small bowel AVMs cauterized by Dr. Alice Reichert in April 2022 when he presented with  melena presents to the hospital with weakness and melena.  Chest x-ray shows no worsening of pleural effusions hemoglobin is slightly lower than what it was on admission but close to what it was at discharge.  He has had no prior colonoscopy or capsule study of  the small bowel to rule out AVMs in other parts of the GI tract.  Plan 1.  Monitor CBC and transfuse 2.  IV PPI 3.  EGD later today  4. Inform Dr Blaine Hamper that he has had some abdominal pain , to consider CT scan if it is worse from his initial exam from this morning   I have discussed alternative options, risks & benefits,  which include, but are not limited to, bleeding, infection, perforation,respiratory complication & drug reaction.  The patient agrees with this plan & written consent will be obtained.     Thank you for involving me in the care of this patient.      LOS: 0 days   Andres Bellows, MD  12/09/2020, 10:59 AM

## 2020-12-10 DIAGNOSIS — I2510 Atherosclerotic heart disease of native coronary artery without angina pectoris: Secondary | ICD-10-CM | POA: Diagnosis not present

## 2020-12-10 DIAGNOSIS — R749 Abnormal serum enzyme level, unspecified: Secondary | ICD-10-CM | POA: Diagnosis not present

## 2020-12-10 DIAGNOSIS — K922 Gastrointestinal hemorrhage, unspecified: Secondary | ICD-10-CM | POA: Diagnosis not present

## 2020-12-10 DIAGNOSIS — I482 Chronic atrial fibrillation, unspecified: Secondary | ICD-10-CM | POA: Diagnosis not present

## 2020-12-10 DIAGNOSIS — I5042 Chronic combined systolic (congestive) and diastolic (congestive) heart failure: Secondary | ICD-10-CM

## 2020-12-10 DIAGNOSIS — N400 Enlarged prostate without lower urinary tract symptoms: Secondary | ICD-10-CM

## 2020-12-10 LAB — CBC
HCT: 23.7 % — ABNORMAL LOW (ref 39.0–52.0)
HCT: 24.9 % — ABNORMAL LOW (ref 39.0–52.0)
Hemoglobin: 7.8 g/dL — ABNORMAL LOW (ref 13.0–17.0)
Hemoglobin: 8.1 g/dL — ABNORMAL LOW (ref 13.0–17.0)
MCH: 33.5 pg (ref 26.0–34.0)
MCH: 33.5 pg (ref 26.0–34.0)
MCHC: 32.9 g/dL (ref 30.0–36.0)
MCHC: 32.5 g/dL (ref 30.0–36.0)
MCV: 101.7 fL — ABNORMAL HIGH (ref 80.0–100.0)
MCV: 102.9 fL — ABNORMAL HIGH (ref 80.0–100.0)
Platelets: 158 10*3/uL (ref 150–400)
Platelets: 158 10*3/uL (ref 150–400)
RBC: 2.33 MIL/uL — ABNORMAL LOW (ref 4.22–5.81)
RBC: 2.42 MIL/uL — ABNORMAL LOW (ref 4.22–5.81)
RDW: 20.1 % — ABNORMAL HIGH (ref 11.5–15.5)
RDW: 20.2 % — ABNORMAL HIGH (ref 11.5–15.5)
WBC: 10.5 10*3/uL (ref 4.0–10.5)
WBC: 11 10*3/uL — ABNORMAL HIGH (ref 4.0–10.5)
nRBC: 0 % (ref 0.0–0.2)
nRBC: 0.2 % (ref 0.0–0.2)

## 2020-12-10 LAB — TROPONIN I (HIGH SENSITIVITY)
Troponin I (High Sensitivity): 384 ng/L (ref <18)
Troponin I (High Sensitivity): 423 ng/L (ref <18)
Troponin I (High Sensitivity): 403 ng/L (ref <18)

## 2020-12-10 LAB — VITAMIN B12: Vitamin B-12: 1996 pg/mL — ABNORMAL HIGH (ref 180–914)

## 2020-12-10 LAB — HEMOGLOBIN A1C
Hgb A1c MFr Bld: 5 % (ref 4.8–5.6)
Mean Plasma Glucose: 97 mg/dL

## 2020-12-10 MED ORDER — CARVEDILOL 3.125 MG PO TABS
3.1250 mg | ORAL_TABLET | Freq: Two times a day (BID) | ORAL | Status: DC
  Administered 2020-12-10 – 2020-12-11 (×3): 3.125 mg via ORAL
  Filled 2020-12-10 (×3): qty 1

## 2020-12-10 MED ORDER — FAMOTIDINE 20 MG PO TABS
20.0000 mg | ORAL_TABLET | Freq: Every day | ORAL | Status: DC
  Administered 2020-12-10: 20 mg via ORAL
  Filled 2020-12-10: qty 1

## 2020-12-10 MED ORDER — TAMSULOSIN HCL 0.4 MG PO CAPS
0.4000 mg | ORAL_CAPSULE | Freq: Every day | ORAL | Status: DC
  Administered 2020-12-10: 0.4 mg via ORAL
  Filled 2020-12-10: qty 1

## 2020-12-10 MED ORDER — PANTOPRAZOLE SODIUM 40 MG PO TBEC
40.0000 mg | DELAYED_RELEASE_TABLET | Freq: Two times a day (BID) | ORAL | Status: DC
  Administered 2020-12-10 – 2020-12-11 (×3): 40 mg via ORAL
  Filled 2020-12-10 (×3): qty 1

## 2020-12-10 NOTE — Progress Notes (Signed)
Physical Therapy Treatment Patient Details Name: Andres Gutierrez MRN: [id number] DOB: 17-Dec-1930 Today's Date: 12/10/2020    History of Present Illness Andres Gutierrez is a 87 y.o. male with medical history significant of hypertension, hyperlipidemia, CAD, CABG, MDS (myelodysplastic syndrome), CHF with EF 25 to 30%, CKD stage IIIb, atrial fibrillation not on anticoagulants, anemia, BPH, GI bleeding, ICH, who presents with generalized weakness, dark stool.    PT Comments    Pt safely ambulates with use of RW with intermittent verbal cues for normalizing gait pattern, listed below. At this time the pt is demonstrating safety to return home with HHPT once medically stable.    Follow Up Recommendations  Home health PT;Supervision - Intermittent;Other (comment)     Equipment Recommendations  None recommended by PT    Recommendations for Other Services       Precautions / Restrictions Precautions Precautions: None    Mobility  Bed Mobility Overal bed mobility: Modified Independent Bed Mobility: Supine to Sit     Supine to sit: Modified independent (Device/Increase time)     General bed mobility comments: pt found upright in bedside chair    Transfers Overall transfer level: Needs assistance Equipment used: Rolling walker (2 wheeled) Transfers: Sit to/from [facility] Sit to Stand: Min guard Stand pivot transfers: Min guard          Ambulation/Gait Ambulation/Gait assistance: Supervision Gait Distance (Feet): 200 Feet Assistive device: Rolling walker (2 wheeled)       General Gait Details: Increased R knee flexion in stance phase and during weight acceptance; verbal cues for approximation to AD and increased step length. RPE 3/10 following gait activity.   Stairs             Wheelchair Mobility    Modified Rankin (Stroke Patients Only)       Balance     Sitting balance-[name]: Good     Standing balance support: Bilateral upper  extremity supported Standing balance-[name]: Good                              Cognition Arousal/Alertness: Awake/alert Behavior During Therapy: WFL for tasks assessed/performed Overall Cognitive Status: Within Functional Limits for tasks assessed                                        Exercises Other Exercises Other Exercises: Stair negotiation education: pt able to recall and repeat back appropriate steps for stair navigation    General Comments        Pertinent Vitals/Pain Pain Assessment: No/denies pain    Home Living Family/patient expects to be discharged to:: Private residence Living Arrangements: Alone Available Help at Discharge: Family;Available PRN/intermittently Type of Home: House Home Access: Stairs to enter Entrance Stairs-Rails: Right;Left;Can reach both Home Layout: One level Home Equipment: Environmental consultant - 2 wheels;Cane - single point;Bedside commode;Shower seat;Grab bars - tub/shower      Prior Function Level of Independence: Independent with assistive device(s)      Comments: Mod I with RW, doesn't drive (children assist with that), bathes, dresses, no falls in past 6 months.Daughter completes grocery shopping.   PT Goals (current goals can now be found in the care plan section) Acute Rehab PT Goals Patient Stated Goal: d/c today PT Goal Formulation: With patient Time For Goal Achievement: 12/24/20 Potential to Achieve Goals: Good  Frequency    Min 2X/week      PT Plan Current plan remains appropriate    Co-evaluation              AM-PAC PT "6 Clicks" Mobility   Outcome Measure  Help needed turning from your back to your side while in a flat bed without using bedrails?: None Help needed moving from lying on your back to sitting on the side of a flat bed without using bedrails?: None Help needed moving to and from a bed to a chair (including a wheelchair)?: None Help needed standing up from a chair  using your arms (e.g., wheelchair or bedside chair)?: None Help needed to walk in hospital room?: None Help needed climbing 3-5 steps with a railing? : A Little 6 Click Score: 23    End of Session Equipment Utilized During Treatment: Gait belt Activity Tolerance: Patient tolerated treatment well Patient left: in chair;with call bell/phone within reach Nurse Communication: Mobility status PT Visit Diagnosis: Muscle weakness (generalized) (M62.81);Difficulty in walking, not elsewhere classified (R26.2)     Time: 6160-7371 PT Time Calculation (min) (ACUTE ONLY): 14 min  Charges:  $Gait Training: 8-22 mins $Therapeutic Activity: 8-22 mins                     1:58 PM, 12/10/20 Harshan Kearley A. Saverio Danker PT, DPT Physical Therapist - [facility]    Theodus Ran A Onyx Schirmer 12/10/2020, 1:57 PM

## 2020-12-10 NOTE — Consult Note (Signed)
Cardiology Consultation Note    Patient ID: Andres Gutierrez, MRN: [id number], DOB/AGE: [id number] 85 y.o. Admit date: 12/09/2020   Date of Consult: 12/10/2020 Primary Physician: Rusty Aus, MD Primary Cardiologist: Dr. Ubaldo Glassing  Chief Complaint: GIB Reason for Consultation: troponin Requesting MD: Dr. Fritzi Mandes  HPI: Andres Gutierrez is a 87 y.o. male who presented to emergency room with concern about possible CVA.  He was recently admitted for an extended period time with history of GI bleed and pleural effusions.  Underwent right thoracentesis and was discharged to home.  Return his hemoglobin has been stable.  He underwent a brain MRI which showed no acute changes.  He has been seen in the past with history of atrial fibrillation, HFrEF with an ejection fraction of 25-30% by functional study done in May 2021, hypertension, coronary artery disease with most recent catheterization done in August 2017 showing three-vessel coronary artery disease with a LIMA jump graft to the ramus intermedius and then distal LAD, with a 75% lesion at the insertion of the ramus intermedius.  Significant intradepartmental review was carried out between [facility] and due to.  Medical management was felt to be indicated.  Underwent a functional ability which showed no reversible ischemia in that distribution. Presented to the emergency room with  weakness.  He had been previously recently admitted from July 16 through 27, 2022 due to congestive heart failure exacerbation and right pleural effusion as well as a history of tarry stools.  He underwent right-sided thoracentesis.   He has a history of atrial fibrillation and is not on anticoagulants due to anemia GI bleeding and history of intracranial hemorrhage.  EKG on presentation suggested atypical ST elevation in the lateral leads.  Patient had no chest pain and clinically did not appear to be having a STEMI.  He was reviewed by the STEMI team and deferred.  Troponins subsequently  were 88, 78, 73, 189, 244, 384.  He has not been anticoagulated due to bleeding risk.  BNP is 1091.  Hemoglobin this a.m. was 8.1.  7.8 yesterday.  Patient has acute on chronic renal insufficiency creatinine of 2.35 this morning.  INR was normal on admission.  Brain MRI showed moderately advanced cerebral atrophy but no acute intracranial abnormality.  There was evidence of remote left temporal occipital and right occipital cortical infarcts.  There were innumerable scattered chronic microhemorrhages involving the posterior cerebral hemispheres and cerebellum felt to be secondary to hypertension.  CT of the abdomen and pelvis revealed no acute abdominal pelvic abnormality.  There were decreased but persistent bilateral pleural effusions right greater than the left.  Chest x-ray showed no evidence of pleural effusion or pneumothorax.  It appears stable from previous study done earlier this week.  His heart rate showed A. fib with rates variable between 70s and mid 100 teens.  Was initially hypotensive but improved after hydration.  Patient is currently stable hemodynamically.  Complains of diffuse left lateral rib discomfort. Past Medical History:  Diagnosis Date   Acalculous cholecystitis 10/29/2015   Acute on chronic heart failure ([place]) 06/07/2019   Anemia    Anginal pain (HCC)    Anxiety    Atrial fibrillation with RVR ([place]) 12/23/2015   Cardiomyopathy ([place])    Cerebral amyloid angiopathy (HCC)    CHF (congestive heart failure) (HCC)    Chronic kidney disease    kidney stones   Coronary artery disease    Cough    Dyspnea    GERD (gastroesophageal  reflux disease)    GERD (gastroesophageal reflux disease) 12/23/2015   History of kidney stones    Hypertension    Lymphadenopathy, hilar    MDS (myelodysplastic syndrome) (HCC)    MI (myocardial infarction) ([place])    x 2   Wheezing       Surgical History:  Past Surgical History:  Procedure Laterality Date   BRONCHIAL NEEDLE ASPIRATION BIOPSY N/A  12/31/2014   Procedure: BRONCHIAL NEEDLE ASPIRATION BIOPSIES from [place]ina;  Surgeon: Flora Lipps, MD;  Location: [facility] ORS;  Service: Cardiopulmonary;  Laterality: N/A;   CARDIAC CATHETERIZATION N/A 12/28/2015   Procedure: Left Heart Cath and Cors/[place]s Angiography;  Surgeon: Yolonda Kida, MD;  Location: [place] CV LAB;  Service: Cardiovascular;  Laterality: N/A;   CATARACT EXTRACTION W/PHACO Left 08/01/2016   Procedure: CATARACT EXTRACTION PHACO AND INTRAOCULAR LENS PLACEMENT ([place]) Left;  Surgeon: Leandrew Koyanagi, MD;  Location: [place];  Service: Ophthalmology;  Laterality: Left;   CHOLECYSTECTOMY     COLON SURGERY     CORONARY ANGIOPLASTY WITH STENT PLACEMENT     CORONARY ARTERY BYPASS GRAFT     CYSTOSCOPY/URETEROSCOPY/HOLMIUM LASER/STENT PLACEMENT Left 03/18/2020   Procedure: CYSTOSCOPY/URETEROSCOPY/HOLMIUM LASER/STENT PLACEMENT;  Surgeon: Billey Co, MD;  Location: [facility] ORS;  Service: Urology;  Laterality: Left;   ENDOBRONCHIAL ULTRASOUND N/A 12/31/2014   Procedure: ENDOBRONCHIAL ULTRASOUND;  Surgeon: Flora Lipps, MD;  Location: [facility] ORS;  Service: Cardiopulmonary;  Laterality: N/A;   ENTEROSCOPY N/A 12/09/2020   Procedure: ENTEROSCOPY;  Surgeon: [name], MD;  Location: [facility] ENDOSCOPY;  Service: Gastroenterology;  Laterality: N/A;   ESOPHAGOGASTRODUODENOSCOPY N/A 08/15/2020   Procedure: ESOPHAGOGASTRODUODENOSCOPY (EGD);  Surgeon: Toledo, Benay Pike, MD;  Location: [facility] ENDOSCOPY;  Service: Gastroenterology;  Laterality: N/A;   ESOPHAGOGASTRODUODENOSCOPY (EGD) WITH PROPOFOL N/A 06/20/2015   Procedure: ESOPHAGOGASTRODUODENOSCOPY (EGD) WITH PROPOFOL;  Surgeon: Lollie Sails, MD;  Location[facility] END[facility]COPY;  Service: Endoscopy;  Laterality: N/A;   IR CATHETER TUBE CHANGE  10/26/2019   IR CHOLANGIOGRAM EXISTING TUBE  08/13/2019     Home Meds: Prior to Admission medications   Medication Sig Start Date End Date Taking? Authorizing Provider  Ascorbic Acid (VITAMIN C  PO) Take 1 tablet by mouth daily with breakfast.   Yes [provider]  atorvastatin (LIPITOR) 40 MG tablet Take 1 tablet (40 mg total) by mouth daily with supper. 02/27/20  Yes Enzo Bi, MD  carvedilol (COREG) 3.125 MG tablet Take 1 tablet (3.125 mg total) by mouth 2 (two) times daily with a meal. 12/07/20  Yes Fritzi Mandes, MD  digoxin (LANOXIN) 0.125 MG tablet Take 0.0625 mg by mouth daily.   Yes [provider]  famotidine (PEPCID) 20 MG tablet Take 20 mg by mouth at bedtime. 02/04/19  Yes [provider]  ferrous sulfate 325 (65 FE) MG tablet Take 1 tablet (325 mg total) by mouth every Monday, Wednesday, and Friday. With supper 02/29/20  Yes Enzo Bi, MD  midodrine (PROAMATINE) 10 MG tablet Take 1 tablet (10 mg total) by mouth 3 (three) times daily with meals. 12/07/20  Yes Fritzi Mandes, MD  Multiple Vitamin (MULTIVITAMIN) tablet Take 1 tablet by mouth daily.   Yes [provider]  pantoprazole (PROTONIX) 40 MG tablet Take 1 tablet (40 mg total) by mouth 2 (two) times daily. 08/17/20  Yes Lorella Nimrod, MD  polyethylene glycol (MIRALAX / GLYCOLAX) 17 g packet Take 17 g by mouth daily as needed for mild constipation. 08/17/20  Yes Lorella Nimrod, MD  ranolazine (RANEXA) 1000 MG  SR tablet Take 500 mg by mouth 2 (two) times daily.  01/16/19  Yes [provider]  sucralfate (CARAFATE) 1 G tablet Take 1 g by mouth 2 (two) times daily.    Yes [provider]  tamsulosin (FLOMAX) 0.4 MG CAPS capsule Take 0.4 mg by mouth daily after supper.    Yes [provider]  torsemide (DEMADEX) 20 MG tablet Take 1 tablet (20 mg total) by mouth daily. 12/08/20  Yes Fritzi Mandes, MD  vitamin B-12 (CYANOCOBALAMIN) 500 MCG tablet Take 500 mcg by mouth daily.   Yes [provider]  acetaminophen (TYLENOL) 500 MG tablet Take 500 mg by mouth 2 (two) times daily as needed for mild pain or moderate pain.    [provider]    Inpatient Medications:    carvedilol  3.125 mg Oral BID WC   famotidine  20 mg Oral QHS   pantoprazole  40 mg Oral BID   tamsulosin  0.4 mg Oral QPC supper     Allergies:  Allergies  Allergen Reactions   Levaquin [Levofloxacin] Other (See Comments)    [name], her daughter, said Levaquin should be avoided because when patient had Levaquin a few years ago, he had "a bad reaction".  She said he could not walk and could not lift up his feet.   Escitalopram Other (See Comments)    Reaction:  Makes him feel faint, like he was going to have a heart attack.   5ht3 Receptor Antagonists Other (See Comments)   Diltiazem Rash   Diphenhydramine Hcl Other (See Comments)    Reaction:  Rash and fever a long time ago, but has had it since with no problem.   Maxidex [Dexamethasone] Other (See Comments)    Reaction:  Unknown    Prednisone Other (See Comments)    Pt states that this medication made him feel crazy.     Serotonin Other (See Comments)    Tried 2 different types, lexapro and another one.  Reaction:  Made him feel like he was having a heart attack.    Vytorin [Ezetimibe-Simvastatin] Other (See Comments)    Reaction:  Unknown    Zocor [Simvastatin] Other (See Comments)    Reaction:  Unknown     Social History   Socioeconomic History   Marital status: Widowed    Spouse name: Not on file   Number of children: 2   Years of education: college   Highest education level: Some college, no degree  Occupational History   Occupation: retired  Tobacco Use   Smoking status: Former   Smokeless tobacco: Current    Types: Nurse, children's Use: Never used  Substance and Sexual Activity   Alcohol use: Yes    Comment: occational almost rare once a year   Drug use: No   Sexual activity: Not Currently    Birth control/protection: Abstinence  Other Topics Concern   Not on file  Social History Narrative   Not on file   Social Determinants of Health   Financial Resource Strain: Not on file  Food  Insecurity: Not on file  Transportation Needs: Not on file  Physical Activity: Not on file  Stress: Not on file  Social Connections: Not on file  Intimate Partner Violence: Not on file     Family History  Problem Relation Age of Onset   CAD Mother    Colon cancer Father      Review of Systems: A 12-system review of  systems was performed and is negative except as noted in the HPI.  Labs: No results for input(s): CKTOTAL, CKMB, TROPONINI in the last 72 hours. Lab Results  Component Value Date   WBC 11.0 (H) 12/10/2020   HGB 8.1 (L) 12/10/2020   HCT 24.9 (L) 12/10/2020   MCV 102.9 (H) 12/10/2020   PLT 158 12/10/2020    Recent Labs  Lab 12/09/20 0258  NA 135  K 3.1*  CL 96*  CO2 26  BUN 97*  CREATININE 2.35*  CALCIUM 9.3  PROT 7.1  BILITOT 1.6*  ALKPHOS 56  ALT 28  AST 34  GLUCOSE 124*   Lab Results  Component Value Date   CHOL 71 12/09/2020   HDL 27 (L) 12/09/2020   LDLCALC 30 12/09/2020   TRIG 70 12/09/2020   Lab Results  Component Value Date   DDIMER (H) 09/10/2010    1.46        AT THE INHOUSE ESTABLISHED CUTOFF VALUE OF 0.48 ug/mL FEU, THIS ASSAY HAS BEEN DOCUMENTED IN THE LITERATURE TO HAVE A SENSITIVITY AND NEGATIVE PREDICTIVE VALUE OF AT LEAST 98 TO 99%.  THE TEST RESULT SHOULD BE CORRELATED WITH AN ASSESSMENT OF THE CLINICAL PROBABILITY OF DVT / VTE.    Radiology/Studies:  CT ABDOMEN PELVIS WO CONTRAST  Result Date: 12/09/2020 CLINICAL DATA:  Abdominal pain, acute (Ped 0-18y) EXAM: CT ABDOMEN AND PELVIS WITHOUT CONTRAST TECHNIQUE: Multidetector CT imaging of the abdomen and pelvis was performed following the standard protocol without IV contrast. COMPARISON:  12/05/2020, MRI June 2017 FINDINGS: Lower chest: Decreased but persistent bilateral pleural effusions, right greater than left. Adjacent bibasilar atelectasis. Unchanged cardiomegaly. Hepatobiliary: No focal liver abnormality is seen. Prior cholecystectomy. No biliary dilation. Pancreas:  Mildly atrophic. No pancreatic ductal dilatation or surrounding inflammatory changes. Spleen: Normal in size without focal abnormality. Adrenals/Urinary Tract: Adrenal glands are unremarkable. Unchanged nonobstructive 2-3 mm left upper pole renal stone. The previously seen punctate right renal stone is not well seen likely due to slice selection. Unchanged bilateral renal cysts which are incompletely evaluated without intravenous contrast. The largest again measures 2.8 cm in the lateral upper pole of the left kidney. There is no hydronephrosis. The bladder is unremarkable. Stomach/Bowel: The stomach is within normal limits. There is no evidence of bowel obstruction. Postsurgical changes of right hemicolectomy with ileocolonic anastomosis. Moderate colonic stool burden. Vascular/Lymphatic: Aortoiliac atherosclerotic calcifications. No AAA. Reproductive: Unremarkable. Other: No abdominopelvic ascites.  No hernia. Musculoskeletal: Multilevel degenerative changes of the spine, moderate to severe in the lumbar spine. Unchanged grade 1 anterolisthesis at L4-L5. There are bilateral hip degenerative changes with unchanged mineralization. There is a lucent lesion within the left femoral neck compatible with a benign intraosseous lipoma, as seen on prior MRI in June 2017. IMPRESSION: No acute abdominopelvic abnormality. Unchanged nonobstructive 2-3 mm left upper pole renal stone. Decreased but persistent bilateral pleural effusions, right greater than left with adjacent bibasilar atelectasis. Electronically Signed   By: Maurine Simmering   On: 12/09/2020 12:49   DG Chest 2 View  Result Date: 12/02/2020 CLINICAL DATA:  CHF. EXAM: CHEST - 2 VIEW COMPARISON:  Chest radiograph 11/29/2020. FINDINGS: Post median sternotomy and CABG. Cardiomegaly is not significantly changed. Persistent but improving pulmonary edema. Persistent but improving bilateral pleural effusions, right greater than left. Associated basilar opacities consistent  with compressive atelectasis. There is fluid in the fissures. No pneumothorax or confluent airspace disease. Stable osseous structures. IMPRESSION: Improving CHF with persistent but improving pulmonary edema and bilateral pleural effusions.  Cardiomegaly is not significantly changed. Electronically Signed   By: Keith Rake M.D.   On: 12/02/2020 10:06   DG Chest 2 View  Result Date: 11/26/2020 CLINICAL DATA:  Congestive heart failure.  Weight gain. EXAM: CHEST - 2 VIEW COMPARISON:  None. FINDINGS: Normal cardiac silhouette. Bilateral small effusions and basilar atelectasis similar prior. No pulmonary edema. IMPRESSION: Atelectasis and bibasilar effusions.  No pulmonary edema. Electronically Signed   By: Suzy Bouchard M.D.   On: 11/26/2020 15:38   MR ANGIO HEAD WO CONTRAST  Result Date: 11/22/2020 CLINICAL DATA:  Code stroke follow-up EXAM: MRI HEAD WITHOUT CONTRAST MRA HEAD WITHOUT CONTRAST MRA NECK WITHOUT CONTRAST TECHNIQUE: Multiplanar, multiecho pulse sequences of the brain and surrounding structures were obtained without intravenous contrast. Angiographic images of the Circle of Willis were obtained using MRA technique without intravenous contrast. Angiographic images of the neck were obtained using MRA technique without intravenous contrast. Carotid stenosis measurements (when applicable) are obtained utilizing NASCET criteria, using the distal internal carotid diameter as the denominator. COMPARISON:  03/03/2020 FINDINGS: MRI HEAD Brain: There is no acute infarction or intracranial hemorrhage. There is no intracranial mass, mass effect, or edema. There is no hydrocephalus or extra-axial fluid collection. Prominence of the ventricles and sulci reflects generalized parenchymal volume loss. Patchy and confluent areas of T2 hyperintensity in the supratentorial white matter are nonspecific but may reflect moderate chronic microvascular ischemic changes. Bilateral basal ganglia prominent perivascular  spaces with possible superimposed chronic small vessel infarcts. Small chronic infarct of the left occipitotemporal lobe with chronic blood products. Small chronic right occipital cortical infarct. Few scattered small foci of susceptibility hypointensity in the subcortical cerebral white matter and cerebellum likely reflecting chronic microhemorrhages. Vascular: Major vessel flow voids at the skull base are preserved. Skull and upper cervical spine: Normal marrow signal is preserved. Sinuses/Orbits: Paranasal sinuses are aerated. Bilateral lens replacements. Other: Sella is unremarkable.  Mastoid air cells are clear. MRA HEAD Motion artifact is present with suboptimal evaluation of distal branches. Intracranial internal carotid arteries are patent. Middle and anterior cerebral arteries are patent. Intracranial vertebral arteries, basilar artery, posterior cerebral arteries are patent. Left posterior communicating artery is present. There is no significant stenosis or aneurysm. MRA NECK Motion artifact is present. There is preserved flow related enhancement of common, internal, and external carotid arteries. Internal carotid artery origins are not well evaluated but there is no apparent high-grade stenosis. Extracranial vertebral arteries are patent, noting that origins are not well evaluated. Left vertebral artery is dominant. IMPRESSION: No acute infarction, hemorrhage, or mass. Similar appearance of chronic microvascular ischemic changes, chronic infarcts, and scattered chronic microhemorrhages. Motion degraded vascular imaging of the neck. No apparent high-grade stenosis. No proximal intracranial vessel occlusion or significant stenosis. Electronically Signed   By: Macy Mis M.D.   On: 11/22/2020 13:05   MR ANGIO NECK WO CONTRAST  Result Date: 11/22/2020 CLINICAL DATA:  Code stroke follow-up EXAM: MRI HEAD WITHOUT CONTRAST MRA HEAD WITHOUT CONTRAST MRA NECK WITHOUT CONTRAST TECHNIQUE: Multiplanar,  multiecho pulse sequences of the brain and surrounding structures were obtained without intravenous contrast. Angiographic images of the Circle of Willis were obtained using MRA technique without intravenous contrast. Angiographic images of the neck were obtained using MRA technique without intravenous contrast. Carotid stenosis measurements (when applicable) are obtained utilizing NASCET criteria, using the distal internal carotid diameter as the denominator. COMPARISON:  03/03/2020 FINDINGS: MRI HEAD Brain: There is no acute infarction or intracranial hemorrhage. There is no intracranial mass, mass effect,  or edema. There is no hydrocephalus or extra-axial fluid collection. Prominence of the ventricles and sulci reflects generalized parenchymal volume loss. Patchy and confluent areas of T2 hyperintensity in the supratentorial white matter are nonspecific but may reflect moderate chronic microvascular ischemic changes. Bilateral basal ganglia prominent perivascular spaces with possible superimposed chronic small vessel infarcts. Small chronic infarct of the left occipitotemporal lobe with chronic blood products. Small chronic right occipital cortical infarct. Few scattered small foci of susceptibility hypointensity in the subcortical cerebral white matter and cerebellum likely reflecting chronic microhemorrhages. Vascular: Major vessel flow voids at the skull base are preserved. Skull and upper cervical spine: Normal marrow signal is preserved. Sinuses/Orbits: Paranasal sinuses are aerated. Bilateral lens replacements. Other: Sella is unremarkable.  Mastoid air cells are clear. MRA HEAD Motion artifact is present with suboptimal evaluation of distal branches. Intracranial internal carotid arteries are patent. Middle and anterior cerebral arteries are patent. Intracranial vertebral arteries, basilar artery, posterior cerebral arteries are patent. Left posterior communicating artery is present. There is no  significant stenosis or aneurysm. MRA NECK Motion artifact is present. There is preserved flow related enhancement of common, internal, and external carotid arteries. Internal carotid artery origins are not well evaluated but there is no apparent high-grade stenosis. Extracranial vertebral arteries are patent, noting that origins are not well evaluated. Left vertebral artery is dominant. IMPRESSION: No acute infarction, hemorrhage, or mass. Similar appearance of chronic microvascular ischemic changes, chronic infarcts, and scattered chronic microhemorrhages. Motion degraded vascular imaging of the neck. No apparent high-grade stenosis. No proximal intracranial vessel occlusion or significant stenosis. Electronically Signed   By: Macy Mis M.D.   On: 11/22/2020 13:05   MR BRAIN WO CONTRAST  Result Date: 12/09/2020 CLINICAL DATA:  Initial evaluation for neuro deficit, stroke suspected. EXAM: MRI HEAD WITHOUT CONTRAST TECHNIQUE: Multiplanar, multiecho pulse sequences of the brain and surrounding structures were obtained without intravenous contrast. COMPARISON:  Prior MRI from 11/22/2020. FINDINGS: Brain: Moderately advanced age-related cerebral atrophy with chronic microvascular ischemic disease. Remote lacunar infarct noted at the right posterior lentiform nucleus. Small remote left temporal occipital infarct with associated chronic hemosiderin staining. Additional small remote right occipital cortical infarct. Innumerable scattered chronic micro hemorrhages noted involving the posterior cerebral hemispheres and cerebellum, nonspecific, but favored to be related to chronic poorly controlled hypertension. Overall, appearance is stable from prior. No abnormal foci of restricted diffusion to suggest acute or subacute ischemia. Gray-white matter differentiation otherwise maintained. No acute intracranial hemorrhage. No mass lesion, midline shift or mass effect. No hydrocephalus or extra-axial fluid collection.  Pituitary gland suprasellar region normal. Vascular: Major intracranial vascular flow voids are maintained. Skull and upper cervical spine: Craniocervical junction within normal limits. Degenerative spondylosis at C2-3 without high-grade spinal stenosis noted. Diffusely decreased T1 signal intensity seen within the visualized bone marrow, nonspecific, but most commonly related to anemia, smoking, or obesity. No focal marrow replacing lesions. No scalp soft tissue abnormality. Sinuses/Orbits: Prior bilateral ocular lens replacement. Globes and orbital soft tissues demonstrate no acute finding. Mild scattered mucosal thickening noted within the ethmoidal air cells and maxillary sinuses. Trace left mastoid effusion noted, of doubtful significance. Other: None. IMPRESSION: 1. No acute intracranial abnormality. 2. Remote left temporoccipital and right occipital cortical infarcts. 3. Underlying moderately advanced cerebral atrophy with chronic microvascular ischemic disease. 4. Innumerable scattered chronic micro hemorrhages involving the posterior cerebral hemispheres and cerebellum, nonspecific, but favored to be related to chronic poorly controlled hypertension. Electronically Signed   By: Pincus Badder.D.  On: 12/09/2020 23:35   MR BRAIN WO CONTRAST  Result Date: 11/22/2020 CLINICAL DATA:  Code stroke follow-up EXAM: MRI HEAD WITHOUT CONTRAST MRA HEAD WITHOUT CONTRAST MRA NECK WITHOUT CONTRAST TECHNIQUE: Multiplanar, multiecho pulse sequences of the brain and surrounding structures were obtained without intravenous contrast. Angiographic images of the Circle of Willis were obtained using MRA technique without intravenous contrast. Angiographic images of the neck were obtained using MRA technique without intravenous contrast. Carotid stenosis measurements (when applicable) are obtained utilizing NASCET criteria, using the distal internal carotid diameter as the denominator. COMPARISON:  03/03/2020  FINDINGS: MRI HEAD Brain: There is no acute infarction or intracranial hemorrhage. There is no intracranial mass, mass effect, or edema. There is no hydrocephalus or extra-axial fluid collection. Prominence of the ventricles and sulci reflects generalized parenchymal volume loss. Patchy and confluent areas of T2 hyperintensity in the supratentorial white matter are nonspecific but may reflect moderate chronic microvascular ischemic changes. Bilateral basal ganglia prominent perivascular spaces with possible superimposed chronic small vessel infarcts. Small chronic infarct of the left occipitotemporal lobe with chronic blood products. Small chronic right occipital cortical infarct. Few scattered small foci of susceptibility hypointensity in the subcortical cerebral white matter and cerebellum likely reflecting chronic microhemorrhages. Vascular: Major vessel flow voids at the skull base are preserved. Skull and upper cervical spine: Normal marrow signal is preserved. Sinuses/Orbits: Paranasal sinuses are aerated. Bilateral lens replacements. Other: Sella is unremarkable.  Mastoid air cells are clear. MRA HEAD Motion artifact is present with suboptimal evaluation of distal branches. Intracranial internal carotid arteries are patent. Middle and anterior cerebral arteries are patent. Intracranial vertebral arteries, basilar artery, posterior cerebral arteries are patent. Left posterior communicating artery is present. There is no significant stenosis or aneurysm. MRA NECK Motion artifact is present. There is preserved flow related enhancement of common, internal, and external carotid arteries. Internal carotid artery origins are not well evaluated but there is no apparent high-grade stenosis. Extracranial vertebral arteries are patent, noting that origins are not well evaluated. Left vertebral artery is dominant. IMPRESSION: No acute infarction, hemorrhage, or mass. Similar appearance of chronic microvascular ischemic  changes, chronic infarcts, and scattered chronic microhemorrhages. Motion degraded vascular imaging of the neck. No apparent high-grade stenosis. No proximal intracranial vessel occlusion or significant stenosis. Electronically Signed   By: Macy Mis M.D.   On: 11/22/2020 13:05   [place] CHEST (PLEURAL EFFUSION)  Result Date: 12/07/2020 CLINICAL DATA:  Interventional radiology consulted for left thoracentesis. EXAM: CHEST ULTRASOUND COMPARISON:  None. FINDINGS: No significant left pleural effusion. IMPRESSION: No significant left pleural effusion.  No thoracentesis performed. Electronically Signed   By: Miachel Roux M.D.   On: 12/07/2020 07:23   DG Chest Port 1 View  Result Date: 12/09/2020 CLINICAL DATA:  Weakness and dizziness. EXAM: PORTABLE CHEST 1 VIEW COMPARISON:  December 05, 2020 FINDINGS: Multiple sternal wires and vascular clips are seen. Stable, diffusely increased interstitial lung markings are noted. There is no evidence of a pleural effusion or pneumothorax. The cardiac silhouette is enlarged and unchanged in size. A chronic sixth left rib fracture is seen. Degenerative changes seen throughout the thoracic spine. IMPRESSION: Stable exam without significant interval change when compared to the prior chest plain film, dated December 05, 2020. Electronically Signed   By: Virgina Norfolk M.D.   On: 12/09/2020 03:41   DG Chest Port 1 View  Result Date: 12/05/2020 CLINICAL DATA:  Status post right thoracentesis. EXAM: PORTABLE CHEST 1 VIEW COMPARISON:  December 02, 2020. FINDINGS: Stable cardiomegaly.  Sternotomy wires are noted. No definite pneumothorax is noted status post thoracentesis. Pleural effusion appears to be significantly smaller. IMPRESSION: No definite pneumothorax status post right thoracentesis. Electronically Signed   By: Marijo Conception M.D.   On: 12/05/2020 11:46   DG Chest Port 1 View  Result Date: 11/29/2020 CLINICAL DATA:  Shortness of breath and CHF. EXAM: PORTABLE CHEST 1 VIEW  COMPARISON:  11/26/2020 FINDINGS: 0816 hours. The cardio pericardial silhouette is enlarged. Vascular congestion with diffuse interstitial opacity is progressive in the interval. There is bibasilar atelectasis/infiltrate with small bilateral pleural effusions. Telemetry leads overlie the chest. IMPRESSION: Cardiomegaly with worsening pulmonary edema pattern. Basilar atelectasis/infiltrate with small bilateral pleural effusions. Electronically Signed   By: Misty Stanley M.D.   On: 11/29/2020 08:32   CT RENAL STONE STUDY  Result Date: 12/05/2020 CLINICAL DATA:  Flank pain, kidney stone suspected EXAM: CT ABDOMEN AND PELVIS WITHOUT CONTRAST TECHNIQUE: Multidetector CT imaging of the abdomen and pelvis was performed following the standard protocol without IV contrast. COMPARISON:  10/03/2020 FINDINGS: Lower chest: There is a moderate to large right pleural effusion and small left pleural effusion. Pleural thickening and calcifications overlie the posterior medial right lung. Hepatobiliary: No focal liver abnormality. Status post cholecystectomy. No bile duct dilatation. Pancreas: No pancreas inflammation, mass or main duct dilatation. Spleen: Normal appearance of the spleen. Adrenals/urinary tract: Normal appearance of the adrenal glands. 3 mm stone identified within the upper pole of the left kidney. 2 mm calcification noted within upper pole of right kidney. Bilateral kidney cysts are again noted and are incompletely characterized without IV contrast. The largest is in the lateral cortex of the upper pole of left kidney measuring 2.8 centimeters, image 98/5. No hydronephrosis identified bilaterally. No hydroureter or ureterolithiasis. Urinary bladder appears normal. Stomach/bowel: Stomach is normal. Status post right hemicolectomy with enterocolonic anastomosis. No bowel wall thickening, inflammation, or distension. Vascular/lymphatic: Aortic atherosclerosis. No aneurysm. Calcified lymph nodes identified within  the upper abdomen compatible with prior granulomatous disease. No abdominopelvic adenopathy. Reproductive: Normal. Other: Small volume of free fluid noted within the abdomen and pelvis, increased from previous exam. No discrete fluid collections. No abdominal wall hernia. Musculoskeletal: 7 millimeters anterolisthesis L4 on L5. Multilevel degenerative disc disease is identified throughout the thoracic and lumbar spine. L4-5 and L5-S1 facet arthropathy. IMPRESSION: 1. Nonobstructing small bilateral renal calculi as described above. 2. Cardiac enlargement and bilateral pleural effusions concerning for congestive heart failure. 3. Small volume of free fluid within the abdomen and pelvis, increased from previous exam. 4. Marked thoracolumbar spondylosis. Electronically Signed   By: Kerby Moors M.D.   On: 12/05/2020 06:18   CT HEAD CODE STROKE WO CONTRAST  Result Date: 11/22/2020 CLINICAL DATA:  Code stroke.  Neuro deficit, acute stroke suspected. EXAM: CT HEAD WITHOUT CONTRAST TECHNIQUE: Contiguous axial images were obtained from the base of the skull through the vertex without intravenous contrast. COMPARISON:  July 18, 2020. FINDINGS: Brain: No evidence of acute large vascular territory infarction, acute hemorrhage, hydrocephalus, extra-axial collection or mass lesion/mass effect. Small hypodensities in the left basal ganglia and right posterior limb of internal capsule, likely the sequela of prior lacunar infarcts and similar on prior MRI. Sequela of prior left occipital hemorrhage and suspected amyloid angiopathy better characterized on prior MRI. Similar additional patchy white matter hypoattenuation, most likely related to chronic microvascular ischemic disease. Similar atrophy with ex vacuo ventricular dilation. Similar areas of mineralization along the falx/tentorial leaflets. Vascular: No hyperdense vessel identified. Calcific atherosclerosis. Skull: No  acute fracture. Sinuses/Orbits: Mild left maxillary  sinus and ethmoid air cell mucosal thickening, partially imaged. No visible air-fluid levels. No acute orbital findings. Other: No mastoid effusions. ASPECTS [facility] Stroke Program Early CT Score) total score (0-10 with 10 being normal): 10. IMPRESSION: 1. No evidence of acute large vascular territory infarct or acute hemorrhage. ASPECTS is 10. 2. Small chronic microvascular ischemic disease and suspected prior lacunar infarcts in the left basal ganglia and right posterior limb of internal capsule, likely the sequela of prior lacunar infarcts and similar on prior MRI. 3. Sequela of prior left occipital hemorrhage and suspected amyloid angiopathy better characterized on prior MRI. Code stroke imaging results were communicated on 11/22/2020 at 8:42 am to provider Dr. Corky Downs via telephone, who verbally acknowledged these results. Electronically Signed   By: Margaretha Sheffield MD   On: 11/22/2020 08:46   US THORACENTESIS ASP PLEURAL SPACE W/IMG GUIDE  Result Date: 12/05/2020 INDICATION: Patient history of congestive heart failure, AFib, myelodysplastic syndrome currently admitted for acute exacerbation congestive heart failure and found to have bilateral pleural effusions. Request to IR for diagnostic and therapeutic thoracentesis. EXAM: ULTRASOUND GUIDED RIGHT THORACENTESIS MEDICATIONS: 7 mL% lidocaine COMPLICATIONS: None immediate. PROCEDURE: An ultrasound guided thoracentesis was thoroughly discussed with the patient and questions answered. The benefits, risks, alternatives and complications were also discussed. The patient understands and wishes to proceed with the procedure. Written consent was obtained. Ultrasound was performed to localize and mark an adequate pocket of fluid in the right chest. The area was then prepped and draped in the normal sterile fashion. 1% Lidocaine was used for local anesthesia. Under ultrasound guidance a 6 Fr Safe-T-Centesis catheter was introduced. Thoracentesis was performed. The  catheter was removed and a dressing applied. FINDINGS: A total of approximately 2.0 L of clear yellow fluid was removed. Samples were sent to the laboratory as requested by the clinical team. IMPRESSION: Successful ultrasound guided right thoracentesis yielding 2.0 L of pleural fluid. Read by Candiss Norse, PA-C Electronically Signed   By: Markus Daft M.D.   On: 12/05/2020 11:59    Wt Readings from Last 3 Encounters:  12/09/20 73.5 kg  12/07/20 73.2 kg  11/22/20 82.1 kg    EKG: Atrial fibrillation with diffuse lateral ST elevation.  Physical Exam: Chronically ill but stable appearing male in no acute distress Blood pressure (!) 114/54, pulse 81, temperature 98 F (36.7 C), temperature source Oral, resp. rate 16, height 6\' 4"  (1.93 m), weight 73.5 kg, SpO2 100 %. Body mass index is 19.72 kg/m. General: Well developed, well nourished, in no acute distress. Head: Normocephalic, atraumatic, sclera non-icteric, no xanthomas, nares are without discharge.  Neck: Negative for carotid bruits. JVD not elevated. Lungs: Clear bilaterally to auscultation without wheezes, rales, or rhonchi. Breathing is unlabored. Heart: Irregular irregular Abdomen: Soft, non-tender, non-distended with normoactive bowel sounds. No hepatomegaly. No rebound/guarding. No obvious abdominal masses. Msk:  Strength and tone appear normal for age. Extremities: No clubbing or cyanosis. No edema.  Distal pedal pulses are 2+ and equal bilaterally. Neuro: Alert and oriented X 3. No facial asymmetry. No focal deficit. Moves all extremities spontaneously. Psych:  Responds to questions appropriately with a normal affect.     Assessment and Plan  87 year old male with history of chronic atrial fibrillation not anticoagulated due to history of GI bleed, intracranial hemorrhage, fall and bleeding risk, history of coronary disease with history of coronary bypass grafting with most recent cath revealing patent LIMA jump graft to ramus  intermedius and then  LAD.  Had a 75% stenosis in the graft anastomosis at the RI with no ischemia by functional study.  This has been medically managed.  He was recently admitted with pleural effusions, congestive heart failure and history of GI bleed.  Had thoracentesis and GI evaluation.  He now returns several days after discharge with concerns about possible stroke.  Brain MRI showed no acute changes.  EKG showed atrial fibrillation with lateral ST elevation.  This was somewhat more notable than his baseline electrocardiogram.  Not felt to be a STEMI by the STEMI team.  He had no chest pain and no symptoms of an MI on presentation.  1.  Abnormal troponin/EKG-troponins have peaked at 384 thus far.  No anginal symptoms at present.  Has lateral chest wall discomfort.  Not a candidate at present for invasive cardiac evaluation or antiplatelet or anticoagulation due to bleeding risk and history.  We will continue with current regimen and medically managed.  2.  Atrial fibrillation-rate is controlled not anticoagulated.  As per above would not anticoagulate due to bleeding risk and history of GI bleed and chronic anemia  3.  CVA-appears to have had remote CVAs bilaterally likely secondary to his atrial fibrillation which has not been able to be anticoagulated as mentioned above.  No acute changes on current MRI.  4.  Chronic renal insufficiency-appears stable  5.  Anemia-hemoglobin appears stable in the low 8 to upper 7 range.  We will continue to follow for further GI bleeding avoiding anticoagulation or antiplatelet therapy  6.  Pleural effusions-chest x-ray does not show appreciable reaccumulation  7.  Coronary artery disease-as discussed above and I did 1.  We will continue with medical management.  Ivin Booty MD 12/10/2020, 8:37 AM Pager: [phone]

## 2020-12-10 NOTE — Evaluation (Signed)
Occupational Therapy Evaluation Patient Details Name: DRESDEN LOZITO MRN: [id number] DOB: [birth year] Today's Date: 12/10/2020    History of Present Illness AMADU SCHLAGETER is a 87 y.o. male with medical history significant of hypertension, hyperlipidemia, CAD, CABG, MDS (myelodysplastic syndrome), CHF with EF 25 to 30%, CKD stage IIIb, atrial fibrillation not on anticoagulants, anemia, BPH, GI bleeding, ICH, who presents with generalized weakness, dark stool.   Clinical Impression   Mr Attwood was seen for OT evaluation this date. Prior to hospital admission, pt was MOD I for mobility and ADLs using RW. Pt lives alone, reports daughter provides meals daily. Pt presents to acute OT demonstrating impaired ADL performance and functional mobility 2/2 decreased activity tolerance.   Pt currently requires SBA + RW for ADL t/f. MOD I fon B socks seated EOB. SUPERVISION standing grooming with no UE support. Pt demonstrates good safety awareness during ~28ft mobility navigating tight corners in room and reaching outside BOS c no LOBs. Pt would benefit from skilled OT to address noted impairments and functional limitations (see below for any additional details) in order to maximize safety and independence while minimizing falls risk and caregiver burden. Upon hospital discharge, recommend HHOT to maximize pt safety.     Follow Up Recommendations  Home health OT;Supervision - Intermittent    Equipment Recommendations  None recommended by OT    Recommendations for Other Services       Precautions / Restrictions Precautions Precautions: None Restrictions Weight Bearing Restrictions: No      Mobility Bed Mobility               General bed mobility comments: received and left in chair    Transfers Overall transfer level: Needs assistance Equipment used: Rolling walker (2 wheeled) Transfers: Sit to/from Stand Sit to Stand: Supervision              Balance Overall balance assessment:  Needs assistance Sitting-balance support: No upper extremity supported;Feet supported Sitting balance-[name]: Good     Standing balance support: No upper extremity supported;During functional activity Standing balance-[name]: Good                             ADL either performed or assessed with clinical judgement   ADL Overall ADL's : Needs assistance/impaired                                       General ADL Comments: SBA + RW for ADL t/f. MOD I fon B socks seated EOB. SUPERVISION standing grooming with no UE support      Pertinent Vitals/Pain Pain Assessment: No/denies pain     Hand Dominance Right   Extremity/Trunk Assessment Upper Extremity Assessment Upper Extremity Assessment: Generalized weakness   Lower Extremity Assessment Lower Extremity Assessment: Generalized weakness       Communication Communication Communication: HOH   Cognition Arousal/Alertness: Awake/alert Behavior During Therapy: WFL for tasks assessed/performed Overall Cognitive Status: History of cognitive impairments - at baseline                                 General Comments: very pleasant and cooperative   General Comments       Exercises Exercises: Other exercises Other Exercises Other Exercises: Pt educated re: OT role, DME recs, d/c recs,  falls prevention Other Exercises: LBD, sit<>stand, sitting/standing balance/tolerance, ~40 ft mobility, self-drinking, functinoal reach   Shoulder Instructions      Home Living Family/patient expects to be discharged to:: Private residence Living Arrangements: Alone Available Help at Discharge: Family;Available PRN/intermittently Type of Home: House Home Access: Stairs to enter [employer] of Steps: 2 Entrance Stairs-Rails: Right;Left;Can reach both Home Layout: One level     Bathroom Shower/Tub: Occupational psychologist: Standard     Home Equipment: Environmental consultant - 2  wheels;Cane - single point;Bedside commode;Shower seat;Grab bars - tub/shower          Prior Functioning/Environment Level of Independence: Independent with assistive device(s)        Comments: Mod I with RW, doesn't drive (children assist with that), bathes, dresses, no falls in past 6 months. Pt reports he performs his own IADLs but does not do much cooking.        OT Problem List: Decreased strength;Decreased activity tolerance;Decreased safety awareness;Impaired balance (sitting and/or standing);Decreased knowledge of use of DME or AE      OT Treatment/Interventions: Self-care/ADL training;Balance training;Therapeutic exercise;Therapeutic activities;Energy conservation;DME and/or AE instruction;Patient/family education;Manual therapy    OT Goals(Current goals can be found in the care plan section) Acute Rehab OT Goals Patient Stated Goal: get home OT Goal Formulation: With patient Time For Goal Achievement: 12/24/20 Potential to Achieve Goals: Good ADL Goals Pt Will Perform Lower Body Dressing: Independently;sit to/from stand Pt Will Transfer to Toilet: with modified independence;ambulating;regular height toilet (c LRAD PRN) Additional ADL Goal #1: Pt will Independetly verbalize plan to implement x3 falls prevention strategies  OT Frequency: Min 1X/week    AM-PAC OT "6 Clicks" Daily Activity     Outcome Measure Help from another person eating meals?: None Help from another person taking care of personal grooming?: None Help from another person toileting, which includes using toliet, bedpan, or urinal?: A Little Help from another person bathing (including washing, rinsing, drying)?: A Little Help from another person to put on and taking off regular upper body clothing?: None Help from another person to put on and taking off regular lower body clothing?: None 6 Click Score: 22   End of Session Equipment Utilized During Treatment: Rolling walker  Activity Tolerance:  Patient tolerated treatment well Patient left: in chair;with call bell/phone within reach  OT Visit Diagnosis: Unsteadiness on feet (R26.81);Muscle weakness (generalized) (M62.81)                Time: 8850-2774 OT Time Calculation (min): 11 min Charges:  OT General Charges $OT Visit: 1 Visit OT Evaluation $OT Eval Low Complexity: 1 Low  Dessie Coma, M.S. OTR/L  12/10/20, 9:49 AM  ascom [phone]

## 2020-12-10 NOTE — Progress Notes (Signed)
[facility] NAME: Andres Gutierrez    MR#:  [id number]  DATE OF BIRTH:  May 09, 1931  SUBJECTIVE:  patient sitting out in the chair. Son at bedside. Ambulated with PT and worked with OT earlier. Came in because he read in some of the articles symptoms of stroke he thought he was having it call his son to bring him to the hospital to get checked out.  No focal deficit. Tolerating PO diet.  REVIEW OF SYSTEMS:   Review of Systems  Constitutional:  Negative for chills, fever and weight loss.  HENT:  Negative for ear discharge, ear pain and nosebleeds.   Eyes:  Negative for blurred vision, pain and discharge.  Respiratory:  Negative for sputum production, shortness of breath, wheezing and stridor.   Cardiovascular:  Negative for chest pain, palpitations, orthopnea and PND.  Gastrointestinal:  Negative for abdominal pain, diarrhea, nausea and vomiting.  Genitourinary:  Negative for frequency and urgency.  Musculoskeletal:  Negative for back pain and joint pain.  Neurological:  Positive for weakness. Negative for sensory change, speech change and focal weakness.  Psychiatric/Behavioral:  Negative for depression and hallucinations. The patient is not nervous/anxious.   Tolerating Diet:yes Tolerating PT: HHPT  DRUG ALLERGIES:   Allergies  Allergen Reactions   Levaquin [Levofloxacin] Other (See Comments)    [name], her daughter, said Levaquin should be avoided because when patient had Levaquin a few years ago, he had "a bad reaction".  She said he could not walk and could not lift up his feet.   Escitalopram Other (See Comments)    Reaction:  Makes him feel faint, like he was going to have a heart attack.   5ht3 Receptor Antagonists Other (See Comments)   Diltiazem Rash   Diphenhydramine Hcl Other (See Comments)    Reaction:  Rash and fever a long time ago, but has had it since with no problem.   Maxidex [Dexamethasone] Other (See Comments)     Reaction:  Unknown    Prednisone Other (See Comments)    Pt states that this medication made him feel crazy.     Serotonin Other (See Comments)    Tried 2 different types, lexapro and another one.  Reaction:  Made him feel like he was having a heart attack.    Vytorin [Ezetimibe-Simvastatin] Other (See Comments)    Reaction:  Unknown    Zocor [Simvastatin] Other (See Comments)    Reaction:  Unknown     VITALS:  Blood pressure 115/66, pulse 81, temperature (!) 97.4 F (36.3 C), temperature source Axillary, resp. rate 16, height 6\' 4"  (1.93 m), weight 73.5 kg, SpO2 100 %.  PHYSICAL EXAMINATION:   Physical Exam  GENERAL:  87 y.o.-year-old patient lying in the bed with no acute distress. LUNGS: Normal breath sounds bilaterally, no wheezing, rales, rhonchi. No use of accessory muscles of respiration.  CARDIOVASCULAR: S1, S2 normal. No murmurs, rubs, or gallops.  ABDOMEN: Soft, nontender, nondistended. Bowel sounds present. No organomegaly or mass.  EXTREMITIES: No cyanosis, clubbing or edema b/l.    NEUROLOGIC:non focal PSYCHIATRIC:  patient is alert and awake  SKIN: No obvious rash, lesion, or ulcer.   LABORATORY PANEL:  CBC Recent Labs  Lab 12/10/20 0703  WBC 11.0*  HGB 8.1*  HCT 24.9*  PLT 158    Chemistries  Recent Labs  Lab 12/09/20 0258 12/09/20 0759  NA 135  --   K 3.1*  --   CL 96*  --  CO2 26  --   GLUCOSE 124*  --   BUN 97*  --   CREATININE 2.35*  --   CALCIUM 9.3  --   MG  --  1.7  AST 34  --   ALT 28  --   ALKPHOS 56  --   BILITOT 1.6*  --    Cardiac Enzymes No results for input(s): TROPONINI in the last 168 hours. RADIOLOGY:  CT ABDOMEN PELVIS WO CONTRAST  Result Date: 12/09/2020 CLINICAL DATA:  Abdominal pain, acute (Ped 0-18y) EXAM: CT ABDOMEN AND PELVIS WITHOUT CONTRAST TECHNIQUE: Multidetector CT imaging of the abdomen and pelvis was performed following the standard protocol without IV contrast. COMPARISON:  12/05/2020, MRI June 2017  FINDINGS: Lower chest: Decreased but persistent bilateral pleural effusions, right greater than left. Adjacent bibasilar atelectasis. Unchanged cardiomegaly. Hepatobiliary: No focal liver abnormality is seen. Prior cholecystectomy. No biliary dilation. Pancreas: Mildly atrophic. No pancreatic ductal dilatation or surrounding inflammatory changes. Spleen: Normal in size without focal abnormality. Adrenals/Urinary Tract: Adrenal glands are unremarkable. Unchanged nonobstructive 2-3 mm left upper pole renal stone. The previously seen punctate right renal stone is not well seen likely due to slice selection. Unchanged bilateral renal cysts which are incompletely evaluated without intravenous contrast. The largest again measures 2.8 cm in the lateral upper pole of the left kidney. There is no hydronephrosis. The bladder is unremarkable. Stomach/Bowel: The stomach is within normal limits. There is no evidence of bowel obstruction. Postsurgical changes of right hemicolectomy with ileocolonic anastomosis. Moderate colonic stool burden. Vascular/Lymphatic: Aortoiliac atherosclerotic calcifications. No AAA. Reproductive: Unremarkable. Other: No abdominopelvic ascites.  No hernia. Musculoskeletal: Multilevel degenerative changes of the spine, moderate to severe in the lumbar spine. Unchanged grade 1 anterolisthesis at L4-L5. There are bilateral hip degenerative changes with unchanged mineralization. There is a lucent lesion within the left femoral neck compatible with a benign intraosseous lipoma, as seen on prior MRI in June 2017. IMPRESSION: No acute abdominopelvic abnormality. Unchanged nonobstructive 2-3 mm left upper pole renal stone. Decreased but persistent bilateral pleural effusions, right greater than left with adjacent bibasilar atelectasis. Electronically Signed   By: Maurine Simmering   On: 12/09/2020 12:49   MR BRAIN WO CONTRAST  Result Date: 12/09/2020 CLINICAL DATA:  Initial evaluation for neuro deficit, stroke  suspected. EXAM: MRI HEAD WITHOUT CONTRAST TECHNIQUE: Multiplanar, multiecho pulse sequences of the brain and surrounding structures were obtained without intravenous contrast. COMPARISON:  Prior MRI from 11/22/2020. FINDINGS: Brain: Moderately advanced age-related cerebral atrophy with chronic microvascular ischemic disease. Remote lacunar infarct noted at the right posterior lentiform nucleus. Small remote left temporal occipital infarct with associated chronic hemosiderin staining. Additional small remote right occipital cortical infarct. Innumerable scattered chronic micro hemorrhages noted involving the posterior cerebral hemispheres and cerebellum, nonspecific, but favored to be related to chronic poorly controlled hypertension. Overall, appearance is stable from prior. No abnormal foci of restricted diffusion to suggest acute or subacute ischemia. Gray-white matter differentiation otherwise maintained. No acute intracranial hemorrhage. No mass lesion, midline shift or mass effect. No hydrocephalus or extra-axial fluid collection. Pituitary gland suprasellar region normal. Vascular: Major intracranial vascular flow voids are maintained. Skull and upper cervical spine: Craniocervical junction within normal limits. Degenerative spondylosis at C2-3 without high-grade spinal stenosis noted. Diffusely decreased T1 signal intensity seen within the visualized bone marrow, nonspecific, but most commonly related to anemia, smoking, or obesity. No focal marrow replacing lesions. No scalp soft tissue abnormality. Sinuses/Orbits: Prior bilateral ocular lens replacement. Globes and orbital soft tissues demonstrate  no acute finding. Mild scattered mucosal thickening noted within the ethmoidal air cells and maxillary sinuses. Trace left mastoid effusion noted, of doubtful significance. Other: None. IMPRESSION: 1. No acute intracranial abnormality. 2. Remote left temporoccipital and right occipital cortical infarcts. 3.  Underlying moderately advanced cerebral atrophy with chronic microvascular ischemic disease. 4. Innumerable scattered chronic micro hemorrhages involving the posterior cerebral hemispheres and cerebellum, nonspecific, but favored to be related to chronic poorly controlled hypertension. Electronically Signed   By: Jeannine Boga M.D.   On: 12/09/2020 23:35   DG Chest Port 1 View  Result Date: 12/09/2020 CLINICAL DATA:  Weakness and dizziness. EXAM: PORTABLE CHEST 1 VIEW COMPARISON:  December 05, 2020 FINDINGS: Multiple sternal wires and vascular clips are seen. Stable, diffusely increased interstitial lung markings are noted. There is no evidence of a pleural effusion or pneumothorax. The cardiac silhouette is enlarged and unchanged in size. A chronic sixth left rib fracture is seen. Degenerative changes seen throughout the thoracic spine. IMPRESSION: Stable exam without significant interval change when compared to the prior chest plain film, dated December 05, 2020. Electronically Signed   By: Virgina Norfolk M.D.   On: 12/09/2020 03:41   ASSESSMENT AND PLAN:  JAVIS ABBOUD is a 87 y.o. male with medical history significant of hypertension, hyperlipidemia, CAD, CABG, MDS (myelodysplastic syndrome), CHF with EF 25 to 30%, CKD stage IIIb, atrial fibrillation not on anticoagulants, anemia, BPH, GI bleeding, ICH, who presents with generalized weakness, dark stool.  GI bleed:  Hgb 8.7 on 7/20 --> 8.8 --> 7.8--8.2  --Dr. Vicente Males of card is consulted.  --EGD showed multiple AVMS's in the stomach and small bowel that were ablated with APC.  - Start IV pantoprazole gtt--to oral ppi - Zofran IV for nausea   CAD (coronary artery disease) and elevated trop: Troponin level 88, 78, 73, 189.  I consulted Dr. Ubaldo Glassing of cardiology, who thinks this is demand ischemia, not candidate for heparin or cardiac catheter due to risk of bleeding. - Lipitor, Ranexa -No aspirin due to GI bleeding   HTN (hypertension): -resumed low  dose coreg   Chronic atrial fibrillation [facility]): Heart rate 113, 65, 76 -resume Coreg -Continue digoxin   Iron deficiency anemia due to chronic blood loss: -Continue iron supplement   BPH (benign prostatic hyperplasia) -resume Flomax due to hypotension   Gastroesophageal reflux disease with esophagitis -On IV Protonix drip--change to po PPI   HLD (hyperlipidemia) -Lipitor   CKD (chronic kidney disease), stage IIIb: Patient had baseline creatinine 1.5-1.8 before, but he developed worsening renal function in previous admission.  At discharge, patient had creatinine 2.38, today his creatinine is 2.35, which is close to recent baseline.   Generalized weakness: Likely due to multifactorial etiology as above -PT/OT--recommends HHPT   MDS (myelodysplastic syndrome), low grade ([place]) -Follow-up with hematologist   Chronic combined systolic and diastolic congestive heart failure ([place]): 2D echo on 10/04/2020 showed EF 25-30% with grade 2 diastolic dysfunction.  --clinically no s/s of CHF           DVT ppx: SCD Code Status: Full code Family Communication:   Yes, patient's son Jaziah Goeller at bed side Disposition Plan:  Anticipate discharge back to previous environment Consults called:  Dr. Vicente Males Admission status and Level of care: Progressive Cardiac:    for obs     Status is: Observation   The patient remains OBS appropriate and will d/c before 2 midnights.   Dispo: The patient is from: Home  Anticipated d/c is to: Home tomorrow              Patient currently is  medically stable to d/c.              Difficult to place patient No            TOTAL TIME TAKING CARE OF THIS PATIENT: 25 minutes.  >50% time spent on counselling and coordination of care  Note: This dictation was prepared with Dragon dictation along with smaller phrase technology. Any transcriptional errors that result from this process are unintentional.  Fritzi Mandes M.D    [facility]    CC: Primary care physician; Rusty Aus, MD Patient ID: Kristen Cardinal, male   DOB: February 04, 1931, 87 y.o.   MRN: [id number]

## 2020-12-10 NOTE — Evaluation (Signed)
Physical Therapy Evaluation Patient Details Name: Andres Gutierrez MRN: [id number] DOB: 1930/07/05 Today's Date: 12/10/2020   History of Present Illness  Andres Gutierrez is a 87 y.o. male with medical history significant of hypertension, hyperlipidemia, CAD, CABG, MDS (myelodysplastic syndrome), CHF with EF 25 to 30%, CKD stage IIIb, atrial fibrillation not on anticoagulants, anemia, BPH, GI bleeding, ICH, who presents with generalized weakness, dark stool.  Clinical Impression  The pt presents in good spirits. He is able to complete OOB transfer to bedside chair with good safety awareness and limited need for support. The pt is appropriate for BID session today in order to progress functional mobility. PT recommends home with HHPT.     Follow Up Recommendations Home health PT;Supervision - Intermittent;Other (comment)    Equipment Recommendations  None recommended by PT    Recommendations for Other Services       Precautions / Restrictions Precautions Precautions: None      Mobility  Bed Mobility Overal bed mobility: Modified Independent Bed Mobility: Supine to Sit     Supine to sit: Modified independent (Device/Increase time)          Transfers Overall transfer level: Needs assistance Equipment used: Rolling walker (2 wheeled) Transfers: Sit to/from [facility] Sit to Stand: Min guard Stand pivot transfers: Min guard          Ambulation/Gait                Stairs            Wheelchair Mobility    Modified Rankin (Stroke Patients Only)       Balance     Sitting balance-[name]: Good     Standing balance support: Bilateral upper extremity supported Standing balance-[name]: Good                               Pertinent Vitals/Pain Pain Assessment: No/denies pain    Home Living Family/patient expects to be discharged to:: Private residence Living Arrangements: Alone Available Help at Discharge:  Family;Available PRN/intermittently Type of Home: House Home Access: Stairs to enter Entrance Stairs-Rails: Right;Left;Can reach both Entrance Stairs-Number of Steps: 2 Home Layout: One level Home Equipment: Walker - 2 wheels;Cane - single point;Bedside commode;Shower seat;Grab bars - tub/shower      Prior Function Level of Independence: Independent with assistive device(s)         Comments: Mod I with RW, doesn't drive (children assist with that), bathes, dresses, no falls in past 6 months.Daughter completes grocery shopping.     Hand Dominance   Dominant Hand: Right    Extremity/Trunk Assessment   Upper Extremity Assessment Upper Extremity Assessment: Overall WFL for tasks assessed    Lower Extremity Assessment Lower Extremity Assessment: Overall WFL for tasks assessed    Cervical / Trunk Assessment Cervical / Trunk Assessment: Kyphotic  Communication   Communication: HOH  Cognition Arousal/Alertness: Awake/alert Behavior During Therapy: WFL for tasks assessed/performed Overall Cognitive Status: Within Functional Limits for tasks assessed                                        General Comments      Exercises     Assessment/Plan    PT Assessment Patient needs continued PT services  PT Problem List Decreased strength;Decreased mobility;Decreased activity tolerance;Decreased balance;Cardiopulmonary status limiting activity  PT Treatment Interventions DME instruction;Therapeutic activities;Gait training;Therapeutic exercise;Patient/family education;Stair training;Balance training;Functional mobility training;Neuromuscular re-education    PT Goals (Current goals can be found in the Care Plan section)  Acute Rehab PT Goals Patient Stated Goal: ambulate later today PT Goal Formulation: With patient Time For Goal Achievement: 12/24/20 Potential to Achieve Goals: Good    Frequency Min 2X/week   Barriers to discharge Decreased caregiver  support      Co-evaluation               AM-PAC PT "6 Clicks" Mobility  Outcome Measure Help needed turning from your back to your side while in a flat bed without using bedrails?: None Help needed moving from lying on your back to sitting on the side of a flat bed without using bedrails?: None Help needed moving to and from a bed to a chair (including a wheelchair)?: None Help needed standing up from a chair using your arms (e.g., wheelchair or bedside chair)?: A Little Help needed to walk in hospital room?: A Little Help needed climbing 3-5 steps with a railing? : A Little 6 Click Score: 21    End of Session Equipment Utilized During Treatment: Oxygen;Gait belt Activity Tolerance: Patient tolerated treatment well Patient left: in chair;with call bell/phone within reach Nurse Communication: Mobility status PT Visit Diagnosis: Muscle weakness (generalized) (M62.81);Difficulty in walking, not elsewhere classified (R26.2)    Time: 9791-5041 PT Time Calculation (min) (ACUTE ONLY): 32 min   Charges:   PT Evaluation $PT Eval Low Complexity: 1 Low PT Treatments $Therapeutic Activity: 8-22 mins        1:52 PM, 12/10/20 Wilbert Schouten A. Saverio Danker PT, DPT Physical Therapist - [facility]   Nova Schmuhl A Carmeline Kowal 12/10/2020, 1:51 PM

## 2020-12-11 DIAGNOSIS — I482 Chronic atrial fibrillation, unspecified: Secondary | ICD-10-CM | POA: Diagnosis not present

## 2020-12-11 DIAGNOSIS — I2510 Atherosclerotic heart disease of native coronary artery without angina pectoris: Secondary | ICD-10-CM | POA: Diagnosis not present

## 2020-12-11 DIAGNOSIS — K922 Gastrointestinal hemorrhage, unspecified: Secondary | ICD-10-CM | POA: Diagnosis not present

## 2020-12-11 DIAGNOSIS — I5042 Chronic combined systolic (congestive) and diastolic (congestive) heart failure: Secondary | ICD-10-CM | POA: Diagnosis not present

## 2020-12-11 DIAGNOSIS — N400 Enlarged prostate without lower urinary tract symptoms: Secondary | ICD-10-CM | POA: Diagnosis not present

## 2020-12-11 LAB — BASIC METABOLIC PANEL
Anion gap: 13 (ref 5–15)
BUN: 75 mg/dL — ABNORMAL HIGH (ref 8–23)
CO2: 26 mmol/L (ref 22–32)
Calcium: 8.5 mg/dL — ABNORMAL LOW (ref 8.9–10.3)
Chloride: 94 mmol/L — ABNORMAL LOW (ref 98–111)
Creatinine, Ser: 2 mg/dL — ABNORMAL HIGH (ref 0.61–1.24)
GFR, Estimated: 32 mL/min — ABNORMAL LOW (ref >60)
Glucose, Bld: 104 mg/dL — ABNORMAL HIGH (ref 70–99)
Potassium: 3.3 mmol/L — ABNORMAL LOW (ref 3.5–5.1)
Sodium: 133 mmol/L — ABNORMAL LOW (ref 135–145)

## 2020-12-11 MED ORDER — VITAMIN B-12 1000 MCG PO TABS
500.0000 ug | ORAL_TABLET | Freq: Every day | ORAL | Status: DC
  Administered 2020-12-11: 500 ug via ORAL
  Filled 2020-12-11: qty 1

## 2020-12-11 MED ORDER — ADULT MULTIVITAMIN W/MINERALS CH
1.0000 | ORAL_TABLET | Freq: Every day | ORAL | Status: DC
  Administered 2020-12-11: 1 via ORAL
  Filled 2020-12-11: qty 1

## 2020-12-11 MED ORDER — MIDODRINE HCL 5 MG PO TABS
10.0000 mg | ORAL_TABLET | Freq: Three times a day (TID) | ORAL | Status: DC
  Administered 2020-12-11: 10 mg via ORAL
  Filled 2020-12-11: qty 2

## 2020-12-11 MED ORDER — ATORVASTATIN CALCIUM 20 MG PO TABS: 40.0000 mg | ORAL_TABLET | Freq: Every day | ORAL | Status: DC

## 2020-12-11 MED ORDER — FERROUS SULFATE 325 (65 FE) MG PO TABS: 325.0000 mg | ORAL_TABLET | ORAL | Status: DC

## 2020-12-11 MED ORDER — SUCRALFATE 1 G PO TABS
1.0000 g | ORAL_TABLET | Freq: Two times a day (BID) | ORAL | Status: DC
  Administered 2020-12-11: 1 g via ORAL
  Filled 2020-12-11: qty 1

## 2020-12-11 MED ORDER — MIDODRINE HCL 5 MG PO TABS: 10.0000 mg | ORAL_TABLET | Freq: Three times a day (TID) | ORAL | Status: DC

## 2020-12-11 MED ORDER — RANOLAZINE ER 500 MG PO TB12
500.0000 mg | ORAL_TABLET | Freq: Two times a day (BID) | ORAL | Status: DC
  Administered 2020-12-11: 500 mg via ORAL
  Filled 2020-12-11 (×2): qty 1

## 2020-12-11 MED ORDER — DIGOXIN 125 MCG PO TABS
0.0625 mg | ORAL_TABLET | Freq: Every day | ORAL | Status: DC
  Administered 2020-12-11: 0.0625 mg via ORAL
  Filled 2020-12-11: qty 0.5

## 2020-12-11 MED ORDER — POLYETHYLENE GLYCOL 3350 17 G PO PACK: 17.0000 g | PACK | Freq: Every day | ORAL | Status: DC | PRN

## 2020-12-11 MED ORDER — ASCORBIC ACID 500 MG PO TABS
250.0000 mg | ORAL_TABLET | Freq: Every day | ORAL | Status: DC
Start: 2020-12-12 — End: 2020-12-11

## 2020-12-11 NOTE — Discharge Summary (Signed)
[facility] [place] NAME: Andres Gutierrez    MR#:  [id number]  DATE OF BIRTH:  01/06/1931  DATE OF ADMISSION:  12/09/2020 ADMITTING PHYSICIAN: Ivor Costa, MD  DATE OF DISCHARGE: 12/11/2020  PRIMARY CARE PHYSICIAN: Rusty Aus, MD    ADMISSION DIAGNOSIS:  Demand ischemia ([place]) [I24.8] Elevated troponin [R77.8] Generalized weakness [R53.1] Gastrointestinal hemorrhage, unspecified gastrointestinal hemorrhage type [K92.2] Chest pain, unspecified type [R07.9]  DISCHARGE DIAGNOSIS:  acute on chronic anemia suspected due to slow G.I. bleed likely from AVM found on EGD  SECONDARY DIAGNOSIS:   Past Medical History:  Diagnosis Date   Acalculous cholecystitis 10/29/2015   Acute on chronic heart failure ([place]) 06/07/2019   Anemia    Anginal pain (HCC)    Anxiety    Atrial fibrillation with RVR ([place]) 12/23/2015   Cardiomyopathy (HCC)    Cerebral amyloid angiopathy (HCC)    CHF (congestive heart failure) (HCC)    Chronic kidney disease    kidney stones   Coronary artery disease    Cough    Dyspnea    GERD (gastroesophageal reflux disease)    GERD (gastroesophageal reflux disease) 12/23/2015   History of kidney stones    Hypertension    Lymphadenopathy, hilar    MDS (myelodysplastic syndrome) (HCC)    MI (myocardial infarction) ([place])    x 2   Wheezing     HOSPITAL COURSE:   Andres Gutierrez is a 87 y.o. male with medical history significant of hypertension, hyperlipidemia, CAD, CABG, MDS (myelodysplastic syndrome), CHF with EF 25 to 30%, CKD stage IIIb, atrial fibrillation not on anticoagulants, anemia, BPH, GI bleeding, ICH, who presents with generalized weakness, dark stool.   GI bleed:-- slow suspected due to AVM acute on chronic anemia/history of MDS --  Hgb 8.7 on 7/20 --> 8.8 --> 7.8--8.2 --Dr. Vicente Males of card is consulted.  --EGD showed multiple AVMS's in the stomach and small bowel that were ablated with APC.  - Start IV pantoprazole  gtt--to oral ppi +carafate - Zofran IV for nausea   CAD (coronary artery disease) and elevated trop: Troponin level 88, 78, 73, 189.  I consulted Dr. Ubaldo Glassing of cardiology, who thinks this is demand ischemia, not candidate for heparin or cardiac catheter due to risk of bleeding. - Lipitor, Ranexa -No aspirin due to GI bleeding   HTN (hypertension): -resumed low dose coreg   Chronic atrial fibrillation [facility]): Heart rate 113, 65, 76 -resume Coreg -Continue digoxin   Iron deficiency anemia due to chronic blood loss: -Continue iron supplement--OTC --cont b12   BPH (benign prostatic hyperplasia) -resume Flomax due to hypotension   Gastroesophageal reflux disease with esophagitis -On IV Protonix drip--change to po PPI   HLD (hyperlipidemia) -Lipitor   CKD (chronic kidney disease), stage IIIb: Patient had baseline creatinine 1.5-1.8 before, but he developed worsening renal function in previous admission.  At discharge, patient had creatinine 2.38, today his creatinine is 2.35, which is close to recent baseline.   Generalized weakness: Likely due to multifactorial etiology as above -PT/OT--recommends HHPT --MRI brain--neg for CVA-   MDS (myelodysplastic syndrome), low grade (HCC) -Follow-up with hematologist   Chronic combined systolic and diastolic congestive heart failure ([place]): 2D echo on 10/04/2020 showed EF 25-30% with grade 2 diastolic dysfunction.  --clinically no s/s of CHF      overall at baseline     DVT ppx: SCD Code Status: Full code Family Communication:   Yes, patient's son Acie Custis at  bed side 7/30--he is aware of d/c plans Disposition Plan:  Anticipate discharge back to previous environment Consults called:  Dr. Vicente Males Admission status and Level of care: Progressive Cardiac:    for obs     Status is: Observation      Dispo: The patient is from: Home              Anticipated d/c is to: Home today with [facility]              Patient currently is  medically stable  to d/c.              Difficult to place patient No CONSULTS OBTAINED:    DRUG ALLERGIES:   Allergies  Allergen Reactions   Levaquin [Levofloxacin] Other (See Comments)    [name], her daughter, said Levaquin should be avoided because when patient had Levaquin a few years ago, he had "a bad reaction".  She said he could not walk and could not lift up his feet.   Escitalopram Other (See Comments)    Reaction:  Makes him feel faint, like he was going to have a heart attack.   5ht3 Receptor Antagonists Other (See Comments)   Diltiazem Rash   Diphenhydramine Hcl Other (See Comments)    Reaction:  Rash and fever a long time ago, but has had it since with no problem.   Maxidex [Dexamethasone] Other (See Comments)    Reaction:  Unknown    Prednisone Other (See Comments)    Pt states that this medication made him feel crazy.     Serotonin Other (See Comments)    Tried 2 different types, lexapro and another one.  Reaction:  Made him feel like he was having a heart attack.    Vytorin [Ezetimibe-Simvastatin] Other (See Comments)    Reaction:  Unknown    Zocor [Simvastatin] Other (See Comments)    Reaction:  Unknown     DISCHARGE MEDICATIONS:   Allergies as of 12/11/2020       Reactions   Levaquin [levofloxacin] Other (See Comments)   [name], her daughter, said Levaquin should be avoided because when patient had Levaquin a few years ago, he had "a bad reaction".  She said he could not walk and could not lift up his feet.   Escitalopram Other (See Comments)   Reaction:  Makes him feel faint, like he was going to have a heart attack.   5ht3 Receptor Antagonists Other (See Comments)   Diltiazem Rash   Diphenhydramine Hcl Other (See Comments)   Reaction:  Rash and fever a long time ago, but has had it since with no problem.   Maxidex [dexamethasone] Other (See Comments)   Reaction:  Unknown    Prednisone Other (See Comments)   Pt states that this medication made him feel crazy.      Serotonin Other (See Comments)   Tried 2 different types, lexapro and another one.  Reaction:  Made him feel like he was having a heart attack.    Vytorin [ezetimibe-simvastatin] Other (See Comments)   Reaction:  Unknown    Zocor [simvastatin] Other (See Comments)   Reaction:  Unknown         Medication List     TAKE these medications    acetaminophen 500 MG tablet Commonly known as: TYLENOL Take 500 mg by mouth 2 (two) times daily as needed for mild pain or moderate pain.   atorvastatin 40 MG tablet Commonly known as: LIPITOR Take 1 tablet (40  mg total) by mouth daily with supper.   carvedilol 3.125 MG tablet Commonly known as: COREG Take 1 tablet (3.125 mg total) by mouth 2 (two) times daily with a meal.   digoxin 0.125 MG tablet Commonly known as: LANOXIN Take 0.0625 mg by mouth daily.   famotidine 20 MG tablet Commonly known as: PEPCID Take 20 mg by mouth at bedtime.   ferrous sulfate 325 (65 FE) MG tablet Take 1 tablet (325 mg total) by mouth every Monday, Wednesday, and Friday. With supper   midodrine 10 MG tablet Commonly known as: PROAMATINE Take 1 tablet (10 mg total) by mouth 3 (three) times daily with meals.   multivitamin tablet Take 1 tablet by mouth daily.   pantoprazole 40 MG tablet Commonly known as: PROTONIX Take 1 tablet (40 mg total) by mouth 2 (two) times daily.   polyethylene glycol 17 g packet Commonly known as: MIRALAX / GLYCOLAX Take 17 g by mouth daily as needed for mild constipation.   ranolazine 1000 MG SR tablet Commonly known as: RANEXA Take 500 mg by mouth 2 (two) times daily.   sucralfate 1 g tablet Commonly known as: CARAFATE Take 1 g by mouth 2 (two) times daily.   tamsulosin 0.4 MG Caps capsule Commonly known as: FLOMAX Take 0.4 mg by mouth daily after supper.   torsemide 20 MG tablet Commonly known as: DEMADEX Take 1 tablet (20 mg total) by mouth daily.   vitamin B-12 500 MCG tablet Commonly known as:  CYANOCOBALAMIN Take 500 mcg by mouth daily.   VITAMIN C PO Take 1 tablet by mouth daily with breakfast.        If you experience worsening of your admission symptoms, develop shortness of breath, life threatening emergency, suicidal or homicidal thoughts you must seek medical attention immediately by calling 911 or calling your MD immediately  if symptoms less severe.  You Must read complete instructions/literature along with all the possible adverse reactions/side effects for all the Medicines you take and that have been prescribed to you. Take any new Medicines after you have completely understood and accept all the possible adverse reactions/side effects.   Please note  You were cared for by a hospitalist during your hospital stay. If you have any questions about your discharge medications or the care you received while you were in the hospital after you are discharged, you can call the unit and asked to speak with the hospitalist on call if the hospitalist that took care of you is not available. Once you are discharged, your primary care physician will handle any further medical issues. Please note that NO REFILLS for any discharge medications will be authorized once you are discharged, as it is imperative that you return to your primary care physician (or establish a relationship with a primary care physician if you do not have one) for your aftercare needs so that they can reassess your need for medications and monitor your lab values. Today   SUBJECTIVE   No new complaints  VITAL SIGNS:  Blood pressure (!) 96/49, pulse 81, temperature (!) 97.5 F (36.4 C), resp. rate 16, height 6\' 4"  (1.93 m), weight 73.5 kg, SpO2 95 %.  I/O:   Intake/Output Summary (Last 24 hours) at 12/11/2020 0754 Last data filed at 12/10/2020 2109 Gross per 24 hour  Intake 1680 ml  Output 1000 ml  Net 680 ml    PHYSICAL EXAMINATION:  GENERAL:  87 y.o.-year-old patient lying in the bed with no acute  distress. LUNGS:  Normal breath sounds bilaterally, no wheezing, rales, rhonchi. No use of accessory muscles of respiration. CARDIOVASCULAR: S1, S2 normal. No murmurs, rubs, or gallops. ABDOMEN: Soft, nontender, nondistended. Bowel sounds present. No organomegaly or mass. EXTREMITIES: No cyanosis, clubbing or edema b/l.    NEUROLOGIC:non focal PSYCHIATRIC:  patient is alert and awake  SKIN: No obvious rash, lesion, or ulcer. DATA REVIEW:   CBC  Recent Labs  Lab 12/10/20 0703  WBC 11.0*  HGB 8.1*  HCT 24.9*  PLT 158    Chemistries  Recent Labs  Lab 12/09/20 0258 12/09/20 0759  NA 135  --   K 3.1*  --   CL 96*  --   CO2 26  --   GLUCOSE 124*  --   BUN 97*  --   CREATININE 2.35*  --   CALCIUM 9.3  --   MG  --  1.7  AST 34  --   ALT 28  --   ALKPHOS 56  --   BILITOT 1.6*  --     Microbiology Results   Recent Results (from the past 240 hour(s))  Body fluid culture w Gram Stain     Status: None   Collection Time: 12/05/20 11:17 AM   Specimen: PATH Cytology Pleural fluid  Result Value Ref Range Status   Specimen Description   Final    PLEURAL Performed at [facility], [address] [place], [place] [address]    Special Requests   Final    PLEURAL Performed at [facility], [address], [place], [place] [address]    Gram Stain NO WBC SEEN NO ORGANISMS SEEN   Final   Culture   Final    NO GROWTH Performed at [facility], [place] [address], [place],  [address]    Report Status 12/08/2020 FINAL  Final  Resp Panel by RT-PCR (Flu A&B, Covid) Nasopharyngeal Swab     Status: None   Collection Time: 12/09/20  3:24 AM   Specimen: Nasopharyngeal Swab; Nasopharyngeal(NP) swabs in vial transport medium  Result Value Ref Range Status   SARS Coronavirus 2 by RT PCR NEGATIVE NEGATIVE Final    Comment: (NOTE) SARS-CoV-2 target nucleic acids are NOT DETECTED.  The SARS-CoV-2 RNA is generally detectable in upper respiratory specimens  during the acute phase of infection. The lowest concentration of SARS-CoV-2 viral copies this assay can detect is 138 copies/mL. A negative result does not preclude SARS-Cov-2 infection and should not be used as the sole basis for treatment or other patient management decisions. A negative result may occur with  improper specimen collection/handling, submission of specimen other than nasopharyngeal swab, presence of viral mutation(s) within the areas targeted by this assay, and inadequate number of viral copies(<138 copies/mL). A negative result must be combined with clinical observations, patient history, and epidemiological information. The expected result is Negative.  Fact Sheet for Patients:  EntrepreneurPulse.com.au  Fact Sheet for Healthcare Providers:  IncredibleEmployment.be  This test is no t yet approved or cleared by the [place] FDA and  has been authorized for detection and/or diagnosis of SARS-CoV-2 by FDA under an Emergency Use Authorization (EUA). This EUA will remain  in effect (meaning this test can be used) for the duration of the COVID-19 declaration under Section 564(b)(1) of the Act, 21 U.S.C.section 360bbb-3(b)(1), unless the authorization is terminated  or revoked sooner.       Influenza A by PCR NEGATIVE NEGATIVE Final   Influenza B by PCR NEGATIVE NEGATIVE  Final    Comment: (NOTE) The Xpert Xpress SARS-CoV-2/FLU/RSV plus assay is intended as an aid in the diagnosis of influenza from Nasopharyngeal swab specimens and should not be used as a sole basis for treatment. Nasal washings and aspirates are unacceptable for Xpert Xpress SARS-CoV-2/FLU/RSV testing.  Fact Sheet for Patients: EntrepreneurPulse.com.au  Fact Sheet for Healthcare Providers: IncredibleEmployment.be  This test is not yet approved or cleared by the [place] FDA and has been authorized for detection  and/or diagnosis of SARS-CoV-2 by FDA under an Emergency Use Authorization (EUA). This EUA will remain in effect (meaning this test can be used) for the duration of the COVID-19 declaration under Section 564(b)(1) of the Act, 21 U.S.C. section 360bbb-3(b)(1), unless the authorization is terminated or revoked.  Performed at [facility], [address] [place],  [address]     RADIOLOGY:  CT ABDOMEN PELVIS WO CONTRAST  Result Date: 12/09/2020 CLINICAL DATA:  Abdominal pain, acute (Ped 0-18y) EXAM: CT ABDOMEN AND PELVIS WITHOUT CONTRAST TECHNIQUE: Multidetector CT imaging of the abdomen and pelvis was performed following the standard protocol without IV contrast. COMPARISON:  12/05/2020, MRI June 2017 FINDINGS: Lower chest: Decreased but persistent bilateral pleural effusions, right greater than left. Adjacent bibasilar atelectasis. Unchanged cardiomegaly. Hepatobiliary: No focal liver abnormality is seen. Prior cholecystectomy. No biliary dilation. Pancreas: Mildly atrophic. No pancreatic ductal dilatation or surrounding inflammatory changes. Spleen: Normal in size without focal abnormality. Adrenals/Urinary Tract: Adrenal glands are unremarkable. Unchanged nonobstructive 2-3 mm left upper pole renal stone. The previously seen punctate right renal stone is not well seen likely due to slice selection. Unchanged bilateral renal cysts which are incompletely evaluated without intravenous contrast. The largest again measures 2.8 cm in the lateral upper pole of the left kidney. There is no hydronephrosis. The bladder is unremarkable. Stomach/Bowel: The stomach is within normal limits. There is no evidence of bowel obstruction. Postsurgical changes of right hemicolectomy with ileocolonic anastomosis. Moderate colonic stool burden. Vascular/Lymphatic: Aortoiliac atherosclerotic calcifications. No AAA. Reproductive: Unremarkable. Other: No abdominopelvic ascites.  No hernia. Musculoskeletal:  Multilevel degenerative changes of the spine, moderate to severe in the lumbar spine. Unchanged grade 1 anterolisthesis at L4-L5. There are bilateral hip degenerative changes with unchanged mineralization. There is a lucent lesion within the left femoral neck compatible with a benign intraosseous lipoma, as seen on prior MRI in June 2017. IMPRESSION: No acute abdominopelvic abnormality. Unchanged nonobstructive 2-3 mm left upper pole renal stone. Decreased but persistent bilateral pleural effusions, right greater than left with adjacent bibasilar atelectasis. Electronically Signed   By: Maurine Simmering   On: 12/09/2020 12:49   MR BRAIN WO CONTRAST  Result Date: 12/09/2020 CLINICAL DATA:  Initial evaluation for neuro deficit, stroke suspected. EXAM: MRI HEAD WITHOUT CONTRAST TECHNIQUE: Multiplanar, multiecho pulse sequences of the brain and surrounding structures were obtained without intravenous contrast. COMPARISON:  Prior MRI from 11/22/2020. FINDINGS: Brain: Moderately advanced age-related cerebral atrophy with chronic microvascular ischemic disease. Remote lacunar infarct noted at the right posterior lentiform nucleus. Small remote left temporal occipital infarct with associated chronic hemosiderin staining. Additional small remote right occipital cortical infarct. Innumerable scattered chronic micro hemorrhages noted involving the posterior cerebral hemispheres and cerebellum, nonspecific, but favored to be related to chronic poorly controlled hypertension. Overall, appearance is stable from prior. No abnormal foci of restricted diffusion to suggest acute or subacute ischemia. Gray-white matter differentiation otherwise maintained. No acute intracranial hemorrhage. No mass lesion, midline shift or mass effect. No hydrocephalus or extra-axial fluid collection. Pituitary gland  suprasellar region normal. Vascular: Major intracranial vascular flow voids are maintained. Skull and upper cervical spine: Craniocervical  junction within normal limits. Degenerative spondylosis at C2-3 without high-grade spinal stenosis noted. Diffusely decreased T1 signal intensity seen within the visualized bone marrow, nonspecific, but most commonly related to anemia, smoking, or obesity. No focal marrow replacing lesions. No scalp soft tissue abnormality. Sinuses/Orbits: Prior bilateral ocular lens replacement. Globes and orbital soft tissues demonstrate no acute finding. Mild scattered mucosal thickening noted within the ethmoidal air cells and maxillary sinuses. Trace left mastoid effusion noted, of doubtful significance. Other: None. IMPRESSION: 1. No acute intracranial abnormality. 2. Remote left temporoccipital and right occipital cortical infarcts. 3. Underlying moderately advanced cerebral atrophy with chronic microvascular ischemic disease. 4. Innumerable scattered chronic micro hemorrhages involving the posterior cerebral hemispheres and cerebellum, nonspecific, but favored to be related to chronic poorly controlled hypertension. Electronically Signed   By: Jeannine Boga M.D.   On: 12/09/2020 23:35     CODE STATUS:     Code Status Orders  (From admission, onward)           Start     Ordered   12/09/20 0310  Do not attempt resuscitation/DNR  Continuous       Question Answer Comment  In the event of cardiac or respiratory ARREST Do not call a "code blue"   In the event of cardiac or respiratory ARREST Do not perform Intubation, CPR, defibrillation or ACLS   In the event of cardiac or respiratory ARREST Use medication by any route, position, wound care, and other measures to relive pain and suffering. May use oxygen, suction and manual treatment of airway obstruction as needed for comfort.   Comments Daughter as witness at bedside      12/09/20 0309           Code Status History     Date Active Date Inactive Code Status Order ID Comments User Context   12/01/2020 1008 12/07/2020 2127 DNR [id number]  Nolberto Hanlon, MD Inpatient   11/26/2020 1902 12/01/2020 1008 Full Code [id number]  Marcelyn Bruins, MD ED   10/03/2020 1513 10/04/2020 2203 Full Code [id number]  Ivor Costa, MD ED   08/12/2020 2203 08/17/2020 1821 Full Code [id number]  Clarnce Flock, MD Inpatient   03/03/2020 1649 03/04/2020 2157 Full Code [id number]  Ivor Costa, MD ED   02/26/2020 1412 02/27/2020 1834 Full Code [id number]  Collier Bullock, MD ED   11/06/2019 1201 11/07/2019 1749 Full Code [id number]  Benjamine Sprague, DO Inpatient   07/29/2019 2258 08/02/2019 1929 Full Code [id number]  Jennye Boroughs, MD Inpatient   06/07/2019 1643 06/09/2019 1852 Full Code [id number]  Lorella Nimrod, MD ED   01/07/2019 1514 01/09/2019 1907 Full Code [id number]  Mayo, Pete Pelt, MD ED   01/24/2016 1704 01/26/2016 2045 Full Code [id number]  Domenic Polite, MD Inpatient   12/26/2015 1317 01/02/2016 1650 Full Code [id number]  Bettey Costa, MD ED   12/23/2015 2246 12/24/2015 0835 Full Code [id number]  Lance Coon, MD Inpatient   11/03/2015 1758 11/05/2015 1905 Full Code [id number]  Hower, Aaron Mose, MD ED   10/29/2015 0108 11/01/2015 1605 Full Code [id number]  Alesia Richards, MD ED   12/22/2014 0223 12/24/2014 1413 Full Code [id number]  Juluis Mire, MD Inpatient      Advance Directive Documentation    Flowsheet Row Most Recent Value  Type of Advance Directive Healthcare Power of Attorney, Living will  Pre-existing out of facility DNR order (yellow form or pink  MOST form) --  "MOST" Form in Place? --        TOTAL TIME TAKING CARE OF THIS PATIENT: 40 minutes.    Fritzi Mandes M.D  [facility]    CC: Primary care physician; Rusty Aus, MD

## 2020-12-11 NOTE — TOC Transition Note (Signed)
Transition of Care [facility]) - CM/SW Discharge Note   Patient Details  Name: Andres Gutierrez MRN: [id number] Date of Birth: 1930/09/23  [facility] CM/SW Contact:  Boris Sharper, LCSW Phone Number: 12/11/2020, 9:39 AM   Clinical Narrative:    PT medically stable for discharge per MD. Pt will be transported home by his daughter [name]. CSW notified Corene Cornea with [facility] for a resumption of HH PT, OT, and RN upon discharge.   Final next level of care: [place] Barriers to Discharge: No Barriers Identified   Patient Goals and CMS Choice        Discharge Placement                  Name of family member notified: [name] Patient and family notified of of transfer: 12/11/20  Discharge Plan and Services                          HH Arranged: PT, OT, RN [facility] Agency: [place] ([facility]) Date HH Agency Contacted: 12/10/20 Time [place]: [phone] Representative spoke with at [place]: [place] ([place]) Interventions     Readmission Risk Interventions Readmission Risk Prevention Plan 07/30/2019 06/08/2019  Transportation Screening Complete Complete  PCP or Specialist Appt within 3-5 Days Complete Complete  HRI or [place] Complete Complete  Social Work Consult for [place] Planning/Counseling - Complete  Palliative Care Screening - Not Applicable  Medication Review Press photographer) Complete Complete  Some recent data might be hidden

## 2020-12-14 ENCOUNTER — Ambulatory Visit: Payer: PPO | Admitting: Family

## 2020-12-14 DIAGNOSIS — E782 Mixed hyperlipidemia: Secondary | ICD-10-CM | POA: Diagnosis not present

## 2020-12-14 DIAGNOSIS — I951 Orthostatic hypotension: Secondary | ICD-10-CM | POA: Diagnosis not present

## 2020-12-14 DIAGNOSIS — K5521 Angiodysplasia of colon with hemorrhage: Secondary | ICD-10-CM | POA: Diagnosis not present

## 2020-12-14 DIAGNOSIS — I25810 Atherosclerosis of coronary artery bypass graft(s) without angina pectoris: Secondary | ICD-10-CM | POA: Diagnosis not present

## 2020-12-14 DIAGNOSIS — I5043 Acute on chronic combined systolic (congestive) and diastolic (congestive) heart failure: Secondary | ICD-10-CM | POA: Diagnosis not present

## 2020-12-14 DIAGNOSIS — J90 Pleural effusion, not elsewhere classified: Secondary | ICD-10-CM | POA: Diagnosis not present

## 2020-12-14 DIAGNOSIS — I420 Dilated cardiomyopathy: Secondary | ICD-10-CM | POA: Diagnosis not present

## 2020-12-14 DIAGNOSIS — I482 Chronic atrial fibrillation, unspecified: Secondary | ICD-10-CM | POA: Diagnosis not present

## 2020-12-14 DIAGNOSIS — I5022 Chronic systolic (congestive) heart failure: Secondary | ICD-10-CM | POA: Diagnosis not present

## 2020-12-14 DIAGNOSIS — I10 Essential (primary) hypertension: Secondary | ICD-10-CM | POA: Diagnosis not present

## 2020-12-15 DIAGNOSIS — J918 Pleural effusion in other conditions classified elsewhere: Secondary | ICD-10-CM | POA: Diagnosis not present

## 2020-12-15 DIAGNOSIS — Z9181 History of falling: Secondary | ICD-10-CM | POA: Diagnosis not present

## 2020-12-15 DIAGNOSIS — I25119 Atherosclerotic heart disease of native coronary artery with unspecified angina pectoris: Secondary | ICD-10-CM | POA: Diagnosis not present

## 2020-12-15 DIAGNOSIS — D630 Anemia in neoplastic disease: Secondary | ICD-10-CM | POA: Diagnosis not present

## 2020-12-15 DIAGNOSIS — E854 Organ-limited amyloidosis: Secondary | ICD-10-CM | POA: Diagnosis not present

## 2020-12-15 DIAGNOSIS — I482 Chronic atrial fibrillation, unspecified: Secondary | ICD-10-CM | POA: Diagnosis not present

## 2020-12-15 DIAGNOSIS — Z79899 Other long term (current) drug therapy: Secondary | ICD-10-CM | POA: Diagnosis not present

## 2020-12-15 DIAGNOSIS — F419 Anxiety disorder, unspecified: Secondary | ICD-10-CM | POA: Diagnosis not present

## 2020-12-15 DIAGNOSIS — D696 Thrombocytopenia, unspecified: Secondary | ICD-10-CM | POA: Diagnosis not present

## 2020-12-15 DIAGNOSIS — D469 Myelodysplastic syndrome, unspecified: Secondary | ICD-10-CM | POA: Diagnosis not present

## 2020-12-15 DIAGNOSIS — N400 Enlarged prostate without lower urinary tract symptoms: Secondary | ICD-10-CM | POA: Diagnosis not present

## 2020-12-15 DIAGNOSIS — N179 Acute kidney failure, unspecified: Secondary | ICD-10-CM | POA: Diagnosis not present

## 2020-12-15 DIAGNOSIS — D509 Iron deficiency anemia, unspecified: Secondary | ICD-10-CM | POA: Diagnosis not present

## 2020-12-15 DIAGNOSIS — F17220 Nicotine dependence, chewing tobacco, uncomplicated: Secondary | ICD-10-CM | POA: Diagnosis not present

## 2020-12-15 DIAGNOSIS — K2100 Gastro-esophageal reflux disease with esophagitis, without bleeding: Secondary | ICD-10-CM | POA: Diagnosis not present

## 2020-12-15 DIAGNOSIS — I130 Hypertensive heart and chronic kidney disease with heart failure and stage 1 through stage 4 chronic kidney disease, or unspecified chronic kidney disease: Secondary | ICD-10-CM | POA: Diagnosis not present

## 2020-12-15 DIAGNOSIS — I5043 Acute on chronic combined systolic (congestive) and diastolic (congestive) heart failure: Secondary | ICD-10-CM | POA: Diagnosis not present

## 2020-12-15 DIAGNOSIS — Z955 Presence of coronary angioplasty implant and graft: Secondary | ICD-10-CM | POA: Diagnosis not present

## 2020-12-15 DIAGNOSIS — J9601 Acute respiratory failure with hypoxia: Secondary | ICD-10-CM | POA: Diagnosis not present

## 2020-12-15 DIAGNOSIS — I429 Cardiomyopathy, unspecified: Secondary | ICD-10-CM | POA: Diagnosis not present

## 2020-12-15 DIAGNOSIS — N1832 Chronic kidney disease, stage 3b: Secondary | ICD-10-CM | POA: Diagnosis not present

## 2020-12-15 DIAGNOSIS — I252 Old myocardial infarction: Secondary | ICD-10-CM | POA: Diagnosis not present

## 2020-12-15 DIAGNOSIS — H00019 Hordeolum externum unspecified eye, unspecified eyelid: Secondary | ICD-10-CM | POA: Diagnosis not present

## 2020-12-15 DIAGNOSIS — I680 Cerebral amyloid angiopathy: Secondary | ICD-10-CM | POA: Diagnosis not present

## 2020-12-15 DIAGNOSIS — Z951 Presence of aortocoronary bypass graft: Secondary | ICD-10-CM | POA: Diagnosis not present

## 2020-12-16 DIAGNOSIS — D696 Thrombocytopenia, unspecified: Secondary | ICD-10-CM | POA: Diagnosis not present

## 2020-12-16 DIAGNOSIS — I5043 Acute on chronic combined systolic (congestive) and diastolic (congestive) heart failure: Secondary | ICD-10-CM | POA: Diagnosis not present

## 2020-12-16 DIAGNOSIS — N170 Acute kidney failure with tubular necrosis: Secondary | ICD-10-CM | POA: Diagnosis not present

## 2020-12-16 DIAGNOSIS — K5521 Angiodysplasia of colon with hemorrhage: Secondary | ICD-10-CM | POA: Diagnosis not present

## 2020-12-16 DIAGNOSIS — N179 Acute kidney failure, unspecified: Secondary | ICD-10-CM | POA: Diagnosis not present

## 2020-12-16 DIAGNOSIS — I130 Hypertensive heart and chronic kidney disease with heart failure and stage 1 through stage 4 chronic kidney disease, or unspecified chronic kidney disease: Secondary | ICD-10-CM | POA: Diagnosis not present

## 2020-12-16 DIAGNOSIS — D509 Iron deficiency anemia, unspecified: Secondary | ICD-10-CM | POA: Diagnosis not present

## 2020-12-16 DIAGNOSIS — J918 Pleural effusion in other conditions classified elsewhere: Secondary | ICD-10-CM | POA: Diagnosis not present

## 2020-12-16 DIAGNOSIS — J9601 Acute respiratory failure with hypoxia: Secondary | ICD-10-CM | POA: Diagnosis not present

## 2020-12-16 DIAGNOSIS — N1832 Chronic kidney disease, stage 3b: Secondary | ICD-10-CM | POA: Diagnosis not present

## 2020-12-19 DIAGNOSIS — G609 Hereditary and idiopathic neuropathy, unspecified: Secondary | ICD-10-CM | POA: Diagnosis not present

## 2020-12-19 DIAGNOSIS — L97511 Non-pressure chronic ulcer of other part of right foot limited to breakdown of skin: Secondary | ICD-10-CM | POA: Diagnosis not present

## 2020-12-19 DIAGNOSIS — L97521 Non-pressure chronic ulcer of other part of left foot limited to breakdown of skin: Secondary | ICD-10-CM | POA: Diagnosis not present

## 2020-12-20 ENCOUNTER — Other Ambulatory Visit: Payer: Self-pay

## 2020-12-20 ENCOUNTER — Inpatient Hospital Stay: Payer: PPO

## 2020-12-20 ENCOUNTER — Other Ambulatory Visit: Payer: Self-pay | Admitting: *Deleted

## 2020-12-20 ENCOUNTER — Inpatient Hospital Stay: Payer: PPO | Attending: Internal Medicine

## 2020-12-20 VITALS — BP 102/56 | HR 64 | Temp 96.5°F | Resp 18

## 2020-12-20 DIAGNOSIS — D500 Iron deficiency anemia secondary to blood loss (chronic): Secondary | ICD-10-CM

## 2020-12-20 DIAGNOSIS — N189 Chronic kidney disease, unspecified: Secondary | ICD-10-CM | POA: Insufficient documentation

## 2020-12-20 DIAGNOSIS — I129 Hypertensive chronic kidney disease with stage 1 through stage 4 chronic kidney disease, or unspecified chronic kidney disease: Secondary | ICD-10-CM | POA: Diagnosis not present

## 2020-12-20 DIAGNOSIS — Z79899 Other long term (current) drug therapy: Secondary | ICD-10-CM | POA: Insufficient documentation

## 2020-12-20 DIAGNOSIS — N183 Chronic kidney disease, stage 3 unspecified: Secondary | ICD-10-CM

## 2020-12-20 DIAGNOSIS — D631 Anemia in chronic kidney disease: Secondary | ICD-10-CM | POA: Diagnosis not present

## 2020-12-20 DIAGNOSIS — N1832 Chronic kidney disease, stage 3b: Secondary | ICD-10-CM | POA: Diagnosis not present

## 2020-12-20 DIAGNOSIS — N179 Acute kidney failure, unspecified: Secondary | ICD-10-CM | POA: Diagnosis not present

## 2020-12-20 DIAGNOSIS — I5022 Chronic systolic (congestive) heart failure: Secondary | ICD-10-CM | POA: Diagnosis not present

## 2020-12-20 LAB — CBC WITH DIFFERENTIAL/PLATELET
Abs Immature Granulocytes: 0.03 10*3/uL (ref 0.00–0.07)
Basophils Absolute: 0 10*3/uL (ref 0.0–0.1)
Basophils Relative: 0 %
Eosinophils Absolute: 0 10*3/uL (ref 0.0–0.5)
Eosinophils Relative: 1 %
HCT: 23.5 % — ABNORMAL LOW (ref 39.0–52.0)
Hemoglobin: 7.6 g/dL — ABNORMAL LOW (ref 13.0–17.0)
Immature Granulocytes: 1 %
Lymphocytes Relative: 25 %
Lymphs Abs: 1.6 10*3/uL (ref 0.7–4.0)
MCH: 32.5 pg (ref 26.0–34.0)
MCHC: 32.3 g/dL (ref 30.0–36.0)
MCV: 100.4 fL — ABNORMAL HIGH (ref 80.0–100.0)
Monocytes Absolute: 2.3 10*3/uL — ABNORMAL HIGH (ref 0.1–1.0)
Monocytes Relative: 36 %
Neutro Abs: 2.3 10*3/uL (ref 1.7–7.7)
Neutrophils Relative %: 37 %
Platelets: 138 10*3/uL — ABNORMAL LOW (ref 150–400)
RBC Morphology: NONE SEEN
RBC: 2.34 MIL/uL — ABNORMAL LOW (ref 4.22–5.81)
RDW: 18.7 % — ABNORMAL HIGH (ref 11.5–15.5)
WBC: 6.2 10*3/uL (ref 4.0–10.5)
nRBC: 0 % (ref 0.0–0.2)

## 2020-12-20 LAB — PREPARE RBC (CROSSMATCH)

## 2020-12-20 MED ORDER — DARBEPOETIN ALFA 300 MCG/0.6ML IJ SOSY
300.0000 ug | PREFILLED_SYRINGE | Freq: Once | INTRAMUSCULAR | Status: AC
  Administered 2020-12-20: 300 ug via SUBCUTANEOUS

## 2020-12-21 ENCOUNTER — Inpatient Hospital Stay: Payer: PPO

## 2020-12-21 DIAGNOSIS — I517 Cardiomegaly: Secondary | ICD-10-CM | POA: Diagnosis not present

## 2020-12-21 DIAGNOSIS — Z6821 Body mass index (BMI) 21.0-21.9, adult: Secondary | ICD-10-CM | POA: Diagnosis not present

## 2020-12-21 DIAGNOSIS — D696 Thrombocytopenia, unspecified: Secondary | ICD-10-CM | POA: Diagnosis not present

## 2020-12-21 DIAGNOSIS — I10 Essential (primary) hypertension: Secondary | ICD-10-CM | POA: Diagnosis not present

## 2020-12-21 DIAGNOSIS — Z87891 Personal history of nicotine dependence: Secondary | ICD-10-CM | POA: Diagnosis not present

## 2020-12-21 DIAGNOSIS — I420 Dilated cardiomyopathy: Secondary | ICD-10-CM | POA: Diagnosis not present

## 2020-12-21 DIAGNOSIS — Z7401 Bed confinement status: Secondary | ICD-10-CM | POA: Diagnosis not present

## 2020-12-21 DIAGNOSIS — I482 Chronic atrial fibrillation, unspecified: Secondary | ICD-10-CM | POA: Diagnosis not present

## 2020-12-21 DIAGNOSIS — I5043 Acute on chronic combined systolic (congestive) and diastolic (congestive) heart failure: Secondary | ICD-10-CM | POA: Diagnosis not present

## 2020-12-21 DIAGNOSIS — I509 Heart failure, unspecified: Secondary | ICD-10-CM | POA: Diagnosis not present

## 2020-12-21 DIAGNOSIS — Z87442 Personal history of urinary calculi: Secondary | ICD-10-CM | POA: Diagnosis not present

## 2020-12-21 DIAGNOSIS — K219 Gastro-esophageal reflux disease without esophagitis: Secondary | ICD-10-CM | POA: Diagnosis not present

## 2020-12-21 DIAGNOSIS — I25810 Atherosclerosis of coronary artery bypass graft(s) without angina pectoris: Secondary | ICD-10-CM | POA: Diagnosis not present

## 2020-12-21 DIAGNOSIS — N400 Enlarged prostate without lower urinary tract symptoms: Secondary | ICD-10-CM | POA: Diagnosis not present

## 2020-12-21 DIAGNOSIS — D4620 Refractory anemia with excess of blasts, unspecified: Secondary | ICD-10-CM | POA: Diagnosis not present

## 2020-12-21 DIAGNOSIS — J90 Pleural effusion, not elsewhere classified: Secondary | ICD-10-CM | POA: Diagnosis not present

## 2020-12-21 DIAGNOSIS — N1832 Chronic kidney disease, stage 3b: Secondary | ICD-10-CM | POA: Diagnosis not present

## 2020-12-21 DIAGNOSIS — Z951 Presence of aortocoronary bypass graft: Secondary | ICD-10-CM | POA: Diagnosis not present

## 2020-12-21 DIAGNOSIS — R54 Age-related physical debility: Secondary | ICD-10-CM | POA: Diagnosis not present

## 2020-12-21 DIAGNOSIS — E43 Unspecified severe protein-calorie malnutrition: Secondary | ICD-10-CM | POA: Diagnosis not present

## 2020-12-21 DIAGNOSIS — Z66 Do not resuscitate: Secondary | ICD-10-CM | POA: Diagnosis not present

## 2020-12-21 DIAGNOSIS — I252 Old myocardial infarction: Secondary | ICD-10-CM | POA: Diagnosis not present

## 2020-12-21 DIAGNOSIS — J9811 Atelectasis: Secondary | ICD-10-CM | POA: Diagnosis not present

## 2020-12-21 DIAGNOSIS — I110 Hypertensive heart disease with heart failure: Secondary | ICD-10-CM | POA: Diagnosis not present

## 2020-12-21 DIAGNOSIS — K5521 Angiodysplasia of colon with hemorrhage: Secondary | ICD-10-CM | POA: Diagnosis not present

## 2020-12-21 DIAGNOSIS — I25119 Atherosclerotic heart disease of native coronary artery with unspecified angina pectoris: Secondary | ICD-10-CM | POA: Diagnosis not present

## 2020-12-21 DIAGNOSIS — Z20822 Contact with and (suspected) exposure to covid-19: Secondary | ICD-10-CM | POA: Diagnosis not present

## 2020-12-21 DIAGNOSIS — I959 Hypotension, unspecified: Secondary | ICD-10-CM | POA: Diagnosis not present

## 2020-12-21 DIAGNOSIS — Z9049 Acquired absence of other specified parts of digestive tract: Secondary | ICD-10-CM | POA: Diagnosis not present

## 2020-12-21 DIAGNOSIS — I951 Orthostatic hypotension: Secondary | ICD-10-CM | POA: Diagnosis not present

## 2020-12-21 DIAGNOSIS — R0602 Shortness of breath: Secondary | ICD-10-CM | POA: Diagnosis not present

## 2020-12-21 DIAGNOSIS — I5023 Acute on chronic systolic (congestive) heart failure: Secondary | ICD-10-CM | POA: Diagnosis not present

## 2020-12-21 DIAGNOSIS — Z515 Encounter for palliative care: Secondary | ICD-10-CM | POA: Diagnosis not present

## 2020-12-21 DIAGNOSIS — Z881 Allergy status to other antibiotic agents status: Secondary | ICD-10-CM | POA: Diagnosis not present

## 2020-12-21 DIAGNOSIS — F419 Anxiety disorder, unspecified: Secondary | ICD-10-CM | POA: Diagnosis not present

## 2020-12-21 DIAGNOSIS — D631 Anemia in chronic kidney disease: Secondary | ICD-10-CM

## 2020-12-21 DIAGNOSIS — D469 Myelodysplastic syndrome, unspecified: Secondary | ICD-10-CM | POA: Diagnosis not present

## 2020-12-21 DIAGNOSIS — J9601 Acute respiratory failure with hypoxia: Secondary | ICD-10-CM | POA: Diagnosis not present

## 2020-12-21 DIAGNOSIS — Z7189 Other specified counseling: Secondary | ICD-10-CM | POA: Diagnosis not present

## 2020-12-21 DIAGNOSIS — I130 Hypertensive heart and chronic kidney disease with heart failure and stage 1 through stage 4 chronic kidney disease, or unspecified chronic kidney disease: Secondary | ICD-10-CM | POA: Diagnosis not present

## 2020-12-21 MED ORDER — DIPHENHYDRAMINE HCL 25 MG PO CAPS
25.0000 mg | ORAL_CAPSULE | Freq: Once | ORAL | Status: AC
  Administered 2020-12-21: 25 mg via ORAL
  Filled 2020-12-21: qty 1

## 2020-12-21 MED ORDER — SODIUM CHLORIDE 0.9% IV SOLUTION
250.0000 mL | Freq: Once | INTRAVENOUS | Status: AC
  Administered 2020-12-21: 250 mL via INTRAVENOUS
  Filled 2020-12-21: qty 250

## 2020-12-21 MED ORDER — ACETAMINOPHEN 325 MG PO TABS
650.0000 mg | ORAL_TABLET | Freq: Once | ORAL | Status: AC
  Administered 2020-12-21: 650 mg via ORAL
  Filled 2020-12-21: qty 2

## 2020-12-22 ENCOUNTER — Other Ambulatory Visit: Payer: Self-pay

## 2020-12-22 ENCOUNTER — Other Ambulatory Visit: Payer: Self-pay | Admitting: Cardiology

## 2020-12-22 ENCOUNTER — Ambulatory Visit
Admission: RE | Admit: 2020-12-22 | Discharge: 2020-12-22 | Disposition: A | Discharge status: Home / Self Care | Payer: PPO | Source: Ambulatory Visit | Attending: Cardiology | Admitting: Cardiology

## 2020-12-22 DIAGNOSIS — K5521 Angiodysplasia of colon with hemorrhage: Secondary | ICD-10-CM | POA: Diagnosis not present

## 2020-12-22 DIAGNOSIS — Z79899 Other long term (current) drug therapy: Secondary | ICD-10-CM | POA: Insufficient documentation

## 2020-12-22 DIAGNOSIS — I951 Orthostatic hypotension: Secondary | ICD-10-CM | POA: Diagnosis not present

## 2020-12-22 DIAGNOSIS — I482 Chronic atrial fibrillation, unspecified: Secondary | ICD-10-CM | POA: Diagnosis not present

## 2020-12-22 DIAGNOSIS — I25810 Atherosclerosis of coronary artery bypass graft(s) without angina pectoris: Secondary | ICD-10-CM | POA: Diagnosis not present

## 2020-12-22 DIAGNOSIS — I509 Heart failure, unspecified: Secondary | ICD-10-CM | POA: Insufficient documentation

## 2020-12-22 DIAGNOSIS — I420 Dilated cardiomyopathy: Secondary | ICD-10-CM | POA: Diagnosis not present

## 2020-12-22 DIAGNOSIS — D469 Myelodysplastic syndrome, unspecified: Secondary | ICD-10-CM | POA: Diagnosis not present

## 2020-12-22 DIAGNOSIS — I5043 Acute on chronic combined systolic (congestive) and diastolic (congestive) heart failure: Secondary | ICD-10-CM | POA: Diagnosis not present

## 2020-12-22 DIAGNOSIS — I10 Essential (primary) hypertension: Secondary | ICD-10-CM | POA: Diagnosis not present

## 2020-12-22 LAB — TYPE AND SCREEN
ABO/RH(D): A POS
Antibody Screen: NEGATIVE
Unit division: 0

## 2020-12-22 LAB — BPAM RBC
Blood Product Expiration Date: 202209032359
ISSUE DATE / TIME: 202208101319
Unit Type and Rh: 6200

## 2020-12-22 MED ORDER — POTASSIUM CHLORIDE ER 10 MEQ PO TBCR: 40.0000 meq | EXTENDED_RELEASE_TABLET | Freq: Once | ORAL | Status: DC

## 2020-12-22 MED ORDER — POTASSIUM CHLORIDE CRYS ER 20 MEQ PO TBCR
40.0000 meq | EXTENDED_RELEASE_TABLET | Freq: Once | ORAL | Status: AC
  Administered 2020-12-22: 40 meq via ORAL

## 2020-12-22 MED ORDER — FUROSEMIDE 10 MG/ML IJ SOLN
INTRAMUSCULAR | Status: AC
  Filled 2020-12-22: qty 8

## 2020-12-22 MED ORDER — FUROSEMIDE 10 MG/ML IJ SOLN
80.0000 mg | Freq: Once | INTRAMUSCULAR | Status: AC
  Administered 2020-12-22: 80 mg via INTRAVENOUS

## 2020-12-22 MED ORDER — POTASSIUM CHLORIDE CRYS ER 20 MEQ PO TBCR
EXTENDED_RELEASE_TABLET | ORAL | Status: AC
  Filled 2020-12-22: qty 2

## 2020-12-22 MED ORDER — FUROSEMIDE 10 MG/ML IJ SOLN: 80.0000 mg | Freq: Once | INTRAMUSCULAR | Status: DC

## 2020-12-23 ENCOUNTER — Inpatient Hospital Stay
Admission: EM | Admit: 2020-12-23 | Discharge: 2021-01-01 | DRG: 291 | Disposition: A | Discharge status: Home / Self Care | Payer: PPO | Attending: Obstetrics and Gynecology | Admitting: Obstetrics and Gynecology

## 2020-12-23 ENCOUNTER — Other Ambulatory Visit: Payer: Self-pay

## 2020-12-23 ENCOUNTER — Encounter: Payer: Self-pay | Admitting: Emergency Medicine

## 2020-12-23 ENCOUNTER — Emergency Department: Payer: PPO

## 2020-12-23 DIAGNOSIS — J9601 Acute respiratory failure with hypoxia: Secondary | ICD-10-CM | POA: Diagnosis not present

## 2020-12-23 DIAGNOSIS — Z6821 Body mass index (BMI) 21.0-21.9, adult: Secondary | ICD-10-CM

## 2020-12-23 DIAGNOSIS — I5041 Acute combined systolic (congestive) and diastolic (congestive) heart failure: Secondary | ICD-10-CM | POA: Diagnosis present

## 2020-12-23 DIAGNOSIS — Z951 Presence of aortocoronary bypass graft: Secondary | ICD-10-CM

## 2020-12-23 DIAGNOSIS — I5043 Acute on chronic combined systolic (congestive) and diastolic (congestive) heart failure: Secondary | ICD-10-CM | POA: Diagnosis present

## 2020-12-23 DIAGNOSIS — E785 Hyperlipidemia, unspecified: Secondary | ICD-10-CM | POA: Diagnosis present

## 2020-12-23 DIAGNOSIS — Z79899 Other long term (current) drug therapy: Secondary | ICD-10-CM

## 2020-12-23 DIAGNOSIS — Z9049 Acquired absence of other specified parts of digestive tract: Secondary | ICD-10-CM

## 2020-12-23 DIAGNOSIS — D469 Myelodysplastic syndrome, unspecified: Secondary | ICD-10-CM | POA: Diagnosis present

## 2020-12-23 DIAGNOSIS — N1832 Chronic kidney disease, stage 3b: Secondary | ICD-10-CM | POA: Diagnosis present

## 2020-12-23 DIAGNOSIS — I482 Chronic atrial fibrillation, unspecified: Secondary | ICD-10-CM | POA: Diagnosis present

## 2020-12-23 DIAGNOSIS — Z800 Family history of malignant neoplasm of digestive organs: Secondary | ICD-10-CM

## 2020-12-23 DIAGNOSIS — I130 Hypertensive heart and chronic kidney disease with heart failure and stage 1 through stage 4 chronic kidney disease, or unspecified chronic kidney disease: Principal | ICD-10-CM | POA: Diagnosis present

## 2020-12-23 DIAGNOSIS — Z955 Presence of coronary angioplasty implant and graft: Secondary | ICD-10-CM

## 2020-12-23 DIAGNOSIS — Z87442 Personal history of urinary calculi: Secondary | ICD-10-CM

## 2020-12-23 DIAGNOSIS — Z515 Encounter for palliative care: Secondary | ICD-10-CM

## 2020-12-23 DIAGNOSIS — Z66 Do not resuscitate: Secondary | ICD-10-CM | POA: Diagnosis present

## 2020-12-23 DIAGNOSIS — R062 Wheezing: Secondary | ICD-10-CM

## 2020-12-23 DIAGNOSIS — F419 Anxiety disorder, unspecified: Secondary | ICD-10-CM | POA: Diagnosis present

## 2020-12-23 DIAGNOSIS — I951 Orthostatic hypotension: Secondary | ICD-10-CM | POA: Diagnosis not present

## 2020-12-23 DIAGNOSIS — R54 Age-related physical debility: Secondary | ICD-10-CM | POA: Diagnosis present

## 2020-12-23 DIAGNOSIS — Z888 Allergy status to other drugs, medicaments and biological substances status: Secondary | ICD-10-CM

## 2020-12-23 DIAGNOSIS — D4620 Refractory anemia with excess of blasts, unspecified: Secondary | ICD-10-CM

## 2020-12-23 DIAGNOSIS — N400 Enlarged prostate without lower urinary tract symptoms: Secondary | ICD-10-CM | POA: Diagnosis not present

## 2020-12-23 DIAGNOSIS — Z881 Allergy status to other antibiotic agents status: Secondary | ICD-10-CM

## 2020-12-23 DIAGNOSIS — E43 Unspecified severe protein-calorie malnutrition: Secondary | ICD-10-CM | POA: Diagnosis present

## 2020-12-23 DIAGNOSIS — Z87891 Personal history of nicotine dependence: Secondary | ICD-10-CM

## 2020-12-23 DIAGNOSIS — I252 Old myocardial infarction: Secondary | ICD-10-CM

## 2020-12-23 DIAGNOSIS — I509 Heart failure, unspecified: Secondary | ICD-10-CM

## 2020-12-23 DIAGNOSIS — K219 Gastro-esophageal reflux disease without esophagitis: Secondary | ICD-10-CM | POA: Diagnosis present

## 2020-12-23 DIAGNOSIS — I255 Ischemic cardiomyopathy: Secondary | ICD-10-CM | POA: Diagnosis present

## 2020-12-23 DIAGNOSIS — N183 Chronic kidney disease, stage 3 unspecified: Secondary | ICD-10-CM | POA: Diagnosis present

## 2020-12-23 DIAGNOSIS — D696 Thrombocytopenia, unspecified: Secondary | ICD-10-CM | POA: Diagnosis present

## 2020-12-23 DIAGNOSIS — J90 Pleural effusion, not elsewhere classified: Secondary | ICD-10-CM

## 2020-12-23 DIAGNOSIS — Z20822 Contact with and (suspected) exposure to covid-19: Secondary | ICD-10-CM | POA: Diagnosis present

## 2020-12-23 DIAGNOSIS — Z8249 Family history of ischemic heart disease and other diseases of the circulatory system: Secondary | ICD-10-CM

## 2020-12-23 DIAGNOSIS — I25119 Atherosclerotic heart disease of native coronary artery with unspecified angina pectoris: Secondary | ICD-10-CM | POA: Diagnosis present

## 2020-12-23 LAB — COMPREHENSIVE METABOLIC PANEL
ALT: 14 U/L (ref 0–44)
AST: 24 U/L (ref 15–41)
Albumin: 4.3 g/dL (ref 3.5–5.0)
Alkaline Phosphatase: 68 U/L (ref 38–126)
Anion gap: 10 (ref 5–15)
BUN: 43 mg/dL — ABNORMAL HIGH (ref 8–23)
CO2: 26 mmol/L (ref 22–32)
Calcium: 9.2 mg/dL (ref 8.9–10.3)
Chloride: 100 mmol/L (ref 98–111)
Creatinine, Ser: 2.36 mg/dL — ABNORMAL HIGH (ref 0.61–1.24)
GFR, Estimated: 26 mL/min — ABNORMAL LOW (ref >60)
Glucose, Bld: 105 mg/dL — ABNORMAL HIGH (ref 70–99)
Potassium: 4.5 mmol/L (ref 3.5–5.1)
Sodium: 136 mmol/L (ref 135–145)
Total Bilirubin: 1.5 mg/dL — ABNORMAL HIGH (ref 0.3–1.2)
Total Protein: 7.2 g/dL (ref 6.5–8.1)

## 2020-12-23 LAB — CBC WITH DIFFERENTIAL/PLATELET
Abs Immature Granulocytes: 0.04 10*3/uL (ref 0.00–0.07)
Basophils Absolute: 0 10*3/uL (ref 0.0–0.1)
Basophils Relative: 0 %
Eosinophils Absolute: 0.1 10*3/uL (ref 0.0–0.5)
Eosinophils Relative: 1 %
HCT: 28.1 % — ABNORMAL LOW (ref 39.0–52.0)
Hemoglobin: 9.1 g/dL — ABNORMAL LOW (ref 13.0–17.0)
Immature Granulocytes: 1 %
Lymphocytes Relative: 24 %
Lymphs Abs: 1.5 10*3/uL (ref 0.7–4.0)
MCH: 32.9 pg (ref 26.0–34.0)
MCHC: 32.4 g/dL (ref 30.0–36.0)
MCV: 101.4 fL — ABNORMAL HIGH (ref 80.0–100.0)
Monocytes Absolute: 2.3 10*3/uL — ABNORMAL HIGH (ref 0.1–1.0)
Monocytes Relative: 37 %
Neutro Abs: 2.3 10*3/uL (ref 1.7–7.7)
Neutrophils Relative %: 37 %
Platelets: 154 10*3/uL (ref 150–400)
RBC: 2.77 MIL/uL — ABNORMAL LOW (ref 4.22–5.81)
RDW: 18.5 % — ABNORMAL HIGH (ref 11.5–15.5)
WBC: 6.2 10*3/uL (ref 4.0–10.5)
nRBC: 0 % (ref 0.0–0.2)

## 2020-12-23 LAB — LIPASE, BLOOD: Lipase: 46 U/L (ref 11–51)

## 2020-12-23 LAB — RESP PANEL BY RT-PCR (FLU A&B, COVID) ARPGX2
Influenza A by PCR: NEGATIVE
Influenza B by PCR: NEGATIVE
SARS Coronavirus 2 by RT PCR: NEGATIVE

## 2020-12-23 LAB — TROPONIN I (HIGH SENSITIVITY)
Troponin I (High Sensitivity): 37 ng/L — ABNORMAL HIGH (ref <18)
Troponin I (High Sensitivity): 33 ng/L — ABNORMAL HIGH (ref <18)

## 2020-12-23 LAB — BRAIN NATRIURETIC PEPTIDE: B Natriuretic Peptide: 1557.2 pg/mL — ABNORMAL HIGH (ref 0.0–100.0)

## 2020-12-23 LAB — DIGOXIN LEVEL: Digoxin Level: 1.4 ng/mL (ref 0.8–2.0)

## 2020-12-23 MED ORDER — FUROSEMIDE 10 MG/ML IJ SOLN
80.0000 mg | Freq: Two times a day (BID) | INTRAMUSCULAR | Status: DC
  Administered 2020-12-23: 80 mg via INTRAVENOUS
  Filled 2020-12-23: qty 8

## 2020-12-23 MED ORDER — RANOLAZINE ER 500 MG PO TB12
500.0000 mg | ORAL_TABLET | Freq: Two times a day (BID) | ORAL | Status: DC
  Administered 2020-12-23 – 2021-01-01 (×18): 500 mg via ORAL
  Filled 2020-12-23 (×19): qty 1

## 2020-12-23 MED ORDER — ACETAMINOPHEN 650 MG RE SUPP: 650.0000 mg | Freq: Four times a day (QID) | RECTAL | Status: DC | PRN

## 2020-12-23 MED ORDER — SUCRALFATE 1 G PO TABS
1.0000 g | ORAL_TABLET | Freq: Two times a day (BID) | ORAL | Status: DC
  Administered 2020-12-23 – 2021-01-01 (×18): 1 g via ORAL
  Filled 2020-12-23 (×18): qty 1

## 2020-12-23 MED ORDER — FAMOTIDINE 20 MG PO TABS
10.0000 mg | ORAL_TABLET | ORAL | Status: DC
  Administered 2020-12-24 – 2021-01-01 (×5): 10 mg via ORAL
  Filled 2020-12-23 (×7): qty 1

## 2020-12-23 MED ORDER — FUROSEMIDE 10 MG/ML IJ SOLN
80.0000 mg | Freq: Once | INTRAMUSCULAR | Status: AC
  Administered 2020-12-23: 80 mg via INTRAVENOUS
  Filled 2020-12-23: qty 8

## 2020-12-23 MED ORDER — TAMSULOSIN HCL 0.4 MG PO CAPS
0.4000 mg | ORAL_CAPSULE | Freq: Every day | ORAL | Status: DC
  Administered 2020-12-23 – 2020-12-31 (×9): 0.4 mg via ORAL
  Filled 2020-12-23 (×10): qty 1

## 2020-12-23 MED ORDER — ATORVASTATIN CALCIUM 20 MG PO TABS
40.0000 mg | ORAL_TABLET | Freq: Every day | ORAL | Status: DC
  Administered 2020-12-24 – 2020-12-31 (×8): 40 mg via ORAL
  Filled 2020-12-23 (×8): qty 2

## 2020-12-23 MED ORDER — ACETAMINOPHEN 325 MG PO TABS
650.0000 mg | ORAL_TABLET | Freq: Four times a day (QID) | ORAL | Status: DC | PRN
  Filled 2020-12-23: qty 2

## 2020-12-23 MED ORDER — MIDODRINE HCL 5 MG PO TABS
10.0000 mg | ORAL_TABLET | Freq: Three times a day (TID) | ORAL | Status: DC
  Administered 2020-12-24 – 2020-12-30 (×19): 10 mg via ORAL
  Filled 2020-12-23 (×19): qty 2

## 2020-12-23 MED ORDER — PANTOPRAZOLE SODIUM 40 MG PO TBEC
40.0000 mg | DELAYED_RELEASE_TABLET | Freq: Two times a day (BID) | ORAL | Status: DC
  Administered 2020-12-23 – 2021-01-01 (×18): 40 mg via ORAL
  Filled 2020-12-23 (×17): qty 1

## 2020-12-23 MED ORDER — DIGOXIN 125 MCG PO TABS
0.0625 mg | ORAL_TABLET | Freq: Every day | ORAL | Status: DC
  Administered 2020-12-23 – 2021-01-01 (×10): 0.0625 mg via ORAL
  Filled 2020-12-23 (×10): qty 0.5

## 2020-12-23 MED ORDER — CARVEDILOL 3.125 MG PO TABS
3.1250 mg | ORAL_TABLET | Freq: Two times a day (BID) | ORAL | Status: DC
  Administered 2020-12-24 – 2020-12-28 (×3): 3.125 mg via ORAL
  Filled 2020-12-23 (×3): qty 1

## 2020-12-23 MED ORDER — FAMOTIDINE 20 MG PO TABS: 20.0000 mg | ORAL_TABLET | Freq: Every day | ORAL | Status: DC

## 2020-12-23 NOTE — ED Triage Notes (Signed)
Pt reports that he has been Short of breath for the last several days. He did fluid drained off of his right lung when he was in the hospital a few weeks ago and has increased SHOB. He did get two injections of Lasix yesterday, but continued to be [facility], they called Dr. Ubaldo Glassing and he told them to come here to be evaluated.

## 2020-12-23 NOTE — Assessment & Plan Note (Signed)
Verified with pt and dtr that he wants to be DNR/DNI. They are interested in completing MOST form during this hospitalization. May need to enlist the help of palliative care or spiritual care to assist them in filling out this form.

## 2020-12-23 NOTE — Assessment & Plan Note (Signed)
Chronic. Pt is not anticoagulated due to prior hx of GI bleeding.

## 2020-12-23 NOTE — Assessment & Plan Note (Signed)
Pt recently seen by nephrology. No plans made yet or have been discussed regarding hemodialysis.

## 2020-12-23 NOTE — ED Provider Notes (Signed)
[facility] Emergency Department Provider Note   ____________________________________________   Event Date/Time   First MD Initiated Contact with Patient 12/23/20 [phone]     (approximate)  I have reviewed the triage vital signs and the nursing notes.   HISTORY  Chief Complaint Shortness of Breath    HPI Andres Gutierrez is a 87 y.o. male with past medical history of hypertension, hyperlipidemia, CAD status post CABG, atrial fibrillation, CHF, mild dysplastic syndrome, and CKD who presents to the ED with shortness of breath.  Patient reports that he has been increasingly short of breath for the past 24 to 48 hours.  He has also noticed swelling in each of his legs but denies any associated pain in his legs.  He has had a dry cough but denies any fevers, nausea, vomiting, or diarrhea.  He does state that he has developed pain over his left lower chest wall into the left upper quadrant of his abdomen.  That pain seems to be worse with certain movements, including twisting of his abdomen.  He was seen at his cardiologist office yesterday, was subsequently referred to the hospital for treatment with IV Lasix.  He states he urinated a large amount yesterday and started to feel somewhat better, but breathing worsened overnight last night.  Patient also reports that he noticed some blood in his stool earlier this morning.        Past Medical History:  Diagnosis Date   Acalculous cholecystitis 10/29/2015   Acute on chronic heart failure ([place]) 06/07/2019   Anemia    Anginal pain (HCC)    Anxiety    Atrial fibrillation with RVR ([place]) 12/23/2015   Cardiomyopathy (HCC)    Cerebral amyloid angiopathy (HCC)    CHF (congestive heart failure) (HCC)    Chronic kidney disease    kidney stones   Coronary artery disease    Cough    Dyspnea    GERD (gastroesophageal reflux disease)    GERD (gastroesophageal reflux disease) 12/23/2015   History of kidney stones    Hypertension     Lymphadenopathy, hilar    MDS (myelodysplastic syndrome) (HCC)    MI (myocardial infarction) ([place])    x 2   Wheezing     Patient Active Problem List   Diagnosis Date Noted   Chronic combined systolic and diastolic congestive heart failure ([place]) 12/09/2020   Stool guaiac positive 12/09/2020   Acute hypoxemic respiratory failure ([place]) 11/29/2020   Acute on chronic combined systolic (congestive) and diastolic (congestive) heart failure ([place]) 11/27/2020   Acute exacerbation of CHF (congestive heart failure) ([place]) 11/26/2020   Hyperkalemia 11/26/2020   Acute renal failure superimposed on stage 3b chronic kidney disease ([place]) 11/26/2020   Hypomagnesemia 10/03/2020   Pleural effusion 10/03/2020   Chest pain 10/03/2020   GI bleed 08/12/2020   MDS (myelodysplastic syndrome), low grade ([place]) 07/22/2020   History of 2019 novel coronavirus disease (COVID-19) 05/19/2020   Cerebral amyloid angiopathy ([place]) 03/05/2020   Abdominal pain 03/03/2020   HLD (hyperlipidemia) 03/03/2020   Blurry vision 03/03/2020   CKD (chronic kidney disease), stage IIIb 03/03/2020   Left ureteral stone 03/03/2020   Generalized weakness    Cerebral hemorrhage, nontraumatic ([place]) 03/02/2020   Chronic cholecystitis 11/06/2019   Malnutrition of moderate degree 07/30/2019   Acute neutrophilia 07/30/2019   Sepsis ([place]) 07/29/2019   Acute cholecystitis 07/29/2019   Foot infection    Weakness    NSTEMI (non-ST elevated myocardial infarction) ([place])  Orthostatic hypotension 01/07/2019   Symptomatic anemia 08/05/2018   Anemia of chronic kidney failure, stage 3 (moderate) ([place]) 08/05/2018   BPH (benign prostatic hyperplasia) 07/24/2018   Gastroesophageal reflux disease with esophagitis 07/24/2018   Thrombocytopenia ([place]) 07/24/2018   Primary osteoarthritis of both knees 08/22/2017   Medicare annual wellness visit, initial 29/56/2130   Chronic systolic CHF (congestive heart failure), NYHA class 3 ([place]) 11/27/2016    Chewing tobacco use 01/17/2016   Dyspnea on exertion 12/23/2015   Elevated troponin 12/23/2015   Iron deficiency anemia due to chronic blood loss 12/20/2015   Femoral neck fracture, left, closed, initial encounter 11/03/2015   Chronic atrial fibrillation (HCC)    Coronary artery disease involving native coronary artery of native heart without angina pectoris    B12 deficiency 08/03/2015   Erosive esophagitis 08/03/2015   Adenopathy    CAD (coronary artery disease) 12/22/2014   HTN (hypertension) 12/22/2014   Cardiomyopathy, ischemic 07/27/2014    Past Surgical History:  Procedure Laterality Date   BRONCHIAL NEEDLE ASPIRATION BIOPSY N/A 12/31/2014   Procedure: BRONCHIAL NEEDLE ASPIRATION BIOPSIES from carina;  Surgeon: Flora Lipps, MD;  Location: [facility] ORS;  Service: Cardiopulmonary;  Laterality: N/A;   CARDIAC CATHETERIZATION N/A 12/28/2015   Procedure: Left Heart Cath and Cors/Grafts Angiography;  Surgeon: Yolonda Kida, MD;  Location: [facility];  Service: Cardiovascular;  Laterality: N/A;   CATARACT EXTRACTION W/PHACO Left 08/01/2016   Procedure: CATARACT EXTRACTION PHACO AND INTRAOCULAR LENS PLACEMENT ([place]) Left;  Surgeon: Leandrew Koyanagi, MD;  Location: [facility];  Service: Ophthalmology;  Laterality: Left;   CHOLECYSTECTOMY     COLON SURGERY     CORONARY ANGIOPLASTY WITH STENT PLACEMENT     CORONARY ARTERY BYPASS GRAFT     CYSTOSCOPY/URETEROSCOPY/HOLMIUM LASER/STENT PLACEMENT Left 03/18/2020   Procedure: CYSTOSCOPY/URETEROSCOPY/HOLMIUM LASER/STENT PLACEMENT;  Surgeon: Billey Co, MD;  Location: [facility] ORS;  Service: Urology;  Laterality: Left;   ENDOBRONCHIAL ULTRASOUND N/A 12/31/2014   Procedure: ENDOBRONCHIAL ULTRASOUND;  Surgeon: Flora Lipps, MD;  Location: [facility] ORS;  Service: Cardiopulmonary;  Laterality: N/A;   ENTEROSCOPY N/A 12/09/2020   Procedure: ENTEROSCOPY;  Surgeon: [name], MD;  Location[facility];  Service: Gastroenterology;   Laterality: N/A;   ESOPHAGOGASTRODUODENOSCOPY N/A 08/15/2020   Procedure: ESOPHAGOGASTRODUODENOSCOPY (EGD);  Surgeon: Toledo, Benay Pike, MD;  Location: [facility] ENDOSCOPY;  Service: Gastroenterology;  Laterality: N/A;   ESOPHAGOGASTRODUODENOSCOPY (EGD) WITH PROPOFOL N/A 06/20/2015   Procedure: ESOPHAGOGASTRODUODENOSCOPY (EGD) WITH PROPOFOL;  Surgeon: Lollie Sails, MD;  Location: [facility] ENDOSCOPY;  Service: Endoscopy;  Laterality: N/A;   IR CATHETER TUBE CHANGE  10/26/2019   IR CHOLANGIOGRAM EXISTING TUBE  08/13/2019    Prior to Admission medications   Medication Sig Start Date End Date Taking? Authorizing Provider  acetaminophen (TYLENOL) 500 MG tablet Take 500 mg by mouth 2 (two) times daily as needed for mild pain or moderate pain.    [provider]  Ascorbic Acid (VITAMIN C PO) Take 1 tablet by mouth daily with breakfast.    [provider]  atorvastatin (LIPITOR) 40 MG tablet Take 1 tablet (40 mg total) by mouth daily with supper. 02/27/20   Enzo Bi, MD  carvedilol (COREG) 3.125 MG tablet Take 1 tablet (3.125 mg total) by mouth 2 (two) times daily with a meal. 12/07/20   Fritzi Mandes, MD  digoxin (LANOXIN) 0.125 MG tablet Take 0.0625 mg by mouth daily.    [provider]  famotidine (PEPCID) 20 MG tablet Take 20 mg by mouth at  bedtime. 02/04/19   [provider]  ferrous sulfate 325 (65 FE) MG tablet Take 1 tablet (325 mg total) by mouth every Monday, Wednesday, and Friday. With supper 02/29/20   Enzo Bi, MD  midodrine (PROAMATINE) 10 MG tablet Take 1 tablet (10 mg total) by mouth 3 (three) times daily with meals. 12/07/20   Fritzi Mandes, MD  Multiple Vitamin (MULTIVITAMIN) tablet Take 1 tablet by mouth daily.    [provider]  pantoprazole (PROTONIX) 40 MG tablet Take 1 tablet (40 mg total) by mouth 2 (two) times daily. 08/17/20   Lorella Nimrod, MD  polyethylene glycol (MIRALAX / GLYCOLAX) 17 g packet Take 17 g by mouth daily as needed for mild  constipation. 08/17/20   Lorella Nimrod, MD  ranolazine (RANEXA) 1000 MG SR tablet Take 500 mg by mouth 2 (two) times daily.  01/16/19   [provider]  sucralfate (CARAFATE) 1 G tablet Take 1 g by mouth 2 (two) times daily.     [provider]  tamsulosin (FLOMAX) 0.4 MG CAPS capsule Take 0.4 mg by mouth daily after supper.     [provider]  torsemide (DEMADEX) 20 MG tablet Take 1 tablet (20 mg total) by mouth daily. 12/08/20   Fritzi Mandes, MD  vitamin B-12 (CYANOCOBALAMIN) 500 MCG tablet Take 500 mcg by mouth daily.    [provider]    Allergies Levaquin [levofloxacin], Escitalopram, 5ht3 receptor antagonists, Diltiazem, Diphenhydramine hcl, Maxidex [dexamethasone], Prednisone, Serotonin, Vytorin [ezetimibe-simvastatin], and Zocor [simvastatin]  Family History  Problem Relation Age of Onset   CAD Mother    Colon cancer Father     Social History Social History   Tobacco Use   Smoking status: Former   Smokeless tobacco: Current    Types: Nurse, children's Use: Never used  Substance Use Topics   Alcohol use: Yes    Comment: occational almost rare once a year   Drug use: No    Review of Systems  Constitutional: No fever/chills Eyes: No visual changes. ENT: No sore throat. Cardiovascular: Positive for chest pain. Respiratory: Positive for cough and shortness of breath. Gastrointestinal: Positive for abdominal pain.  No nausea, no vomiting.  No diarrhea.  No constipation. Genitourinary: Negative for dysuria. Musculoskeletal: Negative for back pain.  Positive for leg swelling. Skin: Negative for rash. Neurological: Negative for headaches, focal weakness or numbness.  ____________________________________________   PHYSICAL EXAM:  VITAL SIGNS: ED Triage Vitals  Enc Vitals Group     BP      Pulse      Resp      Temp      Temp src      SpO2      Weight      Height      Head Circumference      Peak Flow      Pain Score       Pain Loc      Pain Edu?      Excl. in [facility]?     Constitutional: Alert and oriented. Eyes: Conjunctivae are normal. Head: Atraumatic. Nose: No congestion/rhinnorhea. Mouth/Throat: Mucous membranes are moist. Neck: Normal ROM Cardiovascular: Normal rate, irregularly irregular rhythm. Grossly normal heart sounds.  2+ radial pulses bilaterally. Respiratory: Normal respiratory effort.  No retractions. Lungs with crackles to bilateral bases. Gastrointestinal: Soft and nontender. No distention. Genitourinary: deferred Musculoskeletal: No lower extremity tenderness, trace pitting edema to knees bilaterally. Neurologic:  Normal speech and language. No gross focal  neurologic deficits are appreciated. Skin:  Skin is warm, dry and intact. No rash noted. Psychiatric: Mood and affect are normal. Speech and behavior are normal.  ____________________________________________   LABS (all labs ordered are listed, but only abnormal results are displayed)  Labs Reviewed  CBC WITH DIFFERENTIAL/PLATELET - Abnormal; Notable for the following components:      Result Value   RBC 2.77 (*)    Hemoglobin 9.1 (*)    HCT 28.1 (*)    MCV 101.4 (*)    RDW 18.5 (*)    Monocytes Absolute 2.3 (*)    All other components within normal limits  COMPREHENSIVE METABOLIC PANEL - Abnormal; Notable for the following components:   Glucose, Bld 105 (*)    BUN 43 (*)    Creatinine, Ser 2.36 (*)    Total Bilirubin 1.5 (*)    GFR, Estimated 26 (*)    All other components within normal limits  BRAIN NATRIURETIC PEPTIDE - Abnormal; Notable for the following components:   B Natriuretic Peptide 1,557.2 (*)    All other components within normal limits  TROPONIN I (HIGH SENSITIVITY) - Abnormal; Notable for the following components:   Troponin I (High Sensitivity) 37 (*)    All other components within normal limits  RESP PANEL BY RT-PCR (FLU A&B, COVID) ARPGX2  LIPASE, BLOOD  DIGOXIN LEVEL  URINALYSIS, COMPLETE  (UACMP) WITH MICROSCOPIC  TROPONIN I (HIGH SENSITIVITY)   ____________________________________________  EKG  ED ECG REPORT I, Blake Divine, the attending physician, personally viewed and interpreted this ECG.   Date: 12/23/2020  EKG Time: 9:17  Rate: 81  Rhythm: atrial fibrillation  Axis: Normal  Intervals:nonspecific intraventricular conduction delay  ST&T Change: None   PROCEDURES  Procedure(s) performed (including Critical Care):  Procedures   ____________________________________________   INITIAL IMPRESSION / ASSESSMENT AND PLAN / ED COURSE      87 year old male with past medical history of hypertension, hyperlipidemia, CAD status post CABG, atrial fibrillation, CHF, myelodysplastic syndrome, and CKD who presents to the ED for increasing shortness of breath and leg swelling over the past couple of days associated with discomfort in his left lower chest wall and left upper quadrant of his abdomen.  Patient is not in any respiratory distress on my evaluation, does have crackles to bilateral bases with mild lower extremity edema.  He received some IV Lasix yesterday and and we will assess for worsening CHF with chest x-ray and BNP.  EKG shows no ischemic changes, nonspecific intraventricular conduction delay is similar to previous, troponin is pending.  Patient does report noticing some blood in his stool this morning and worsening anemia could contribute to his symptoms, additional labs are pending.  Chest x-ray reviewed by me and shows pulmonary edema consistent with CHF exacerbation.  Patient was given additional IV Lasix and he would benefit from hospitalization for further diuresis.  Troponin and BNP noted to be elevated, however troponin is improved compared to previous and I have low suspicion for ACS.  Plan to discuss with hospitalist for admission.      ____________________________________________   FINAL CLINICAL IMPRESSION(S) / ED DIAGNOSES  Final diagnoses:   Acute on chronic combined systolic and diastolic congestive heart failure [facility])     ED Discharge Orders     None        Note:  This document was prepared using Dragon voice recognition software and may include unintentional dictation errors.    Blake Divine, MD 12/23/20 [phone]

## 2020-12-23 NOTE — H&P (Signed)
History and Physical    Andres Gutierrez:[id number] DOB: 25-Jan-1931 DOA: 12/23/2020  PCP: Rusty Aus, MD   Patient coming from: Home  I have personally briefly reviewed patient's old medical records in [place]  CC: SOB HPI: 87 year old white male with a history of combined systolic diastolic heart failure with an EF of 25%, hypertension, CKD stage IIIb, myelodysplastic syndrome, recurrent right pleural effusion, chronic atrial fibrillation not on anticoagulation due to history of prior GI bleed, BPH who presents to the ER today with worsening shortness of breath.  Daughter states the patient has been short of breath for last several weeks.  Patient recently discharged at the end of July for about of heart failure.  Patient recently had a packed red blood cell transfusion in hematology clinic due to a low hemoglobin.  This was 3 days ago.  Patient's had worsening shortness of breath since then.  He was seen in the cardiology office yesterday and had a dose of IV Lasix.  Patient states that he urinated about 1 to 2 L overnight however patient still symptomatically short of breath.  Patient recently seen by nephrology due to CKD stage IIIb.  No plans were made yet to discuss dialysis for him yet.  In the ER, chest x-ray demonstrated pulmonary edema.  Bedside thoracic ultrasound demonstrated right pleural effusion.  Laboratory evaluation showed serum creatinine of 2.36.  Hemoglobin is improved to 9.1 from a baseline of 7.63 days ago.  BNP was elevated to 1557.  This is increased from the end of July where it was 1091.  Due to acute on chronic systolic heart failure, [facility] hospitalist contacted for admission.   ED Course: cxr shows pulmonary edema, right pleural effusion. Started on IV lasix.  Review of Systems:  Review of Systems  Constitutional:  Positive for malaise/fatigue.  HENT: Negative.    Eyes: Negative.   Respiratory:  Positive for shortness of breath.    Cardiovascular:  Positive for leg swelling.  Gastrointestinal:        Dtr states pt has some blood on toilet paper when he wiped today. Blood was bright red. Dtr thinks he may have hemorrhoids.  Genitourinary:  Positive for frequency.  Musculoskeletal: Negative.   Skin: Negative.   Neurological: Negative.   Endo/Heme/Allergies: Negative.   Psychiatric/Behavioral: Negative.    All other systems reviewed and are negative.  Past Medical History:  Diagnosis Date   Acalculous cholecystitis 10/29/2015   Acute on chronic heart failure ([place]) 06/07/2019   Anemia    Anginal pain (HCC)    Anxiety    Atrial fibrillation with RVR ([place]) 12/23/2015   Cardiomyopathy ([place])    Cerebral amyloid angiopathy (HCC)    CHF (congestive heart failure) (HCC)    Chronic kidney disease    kidney stones   Coronary artery disease    Cough    Dyspnea    GERD (gastroesophageal reflux disease)    GERD (gastroesophageal reflux disease) 12/23/2015   History of kidney stones    Hypertension    Lymphadenopathy, hilar    MDS (myelodysplastic syndrome) (HCC)    MI (myocardial infarction) ([place])    x 2   Wheezing     Past Surgical History:  Procedure Laterality Date   BRONCHIAL NEEDLE ASPIRATION BIOPSY N/A 12/31/2014   Procedure: BRONCHIAL NEEDLE ASPIRATION BIOPSIES from [facility]ina;  Surgeon: Flora Lipps, MD;  Location: [facility];  Service: Cardiopulmonary;  Laterality: N/A;   CARDIAC CATHETERIZATION N/A 12/28/2015   Procedure: Left Heart  Cath and Cors/Grafts Angiography;  Surgeon: Yolonda Kida, MD;  Location: [facility];  Service: Cardiovascular;  Laterality: N/A;   CATARACT EXTRACTION W/PHACO Left 08/01/2016   Procedure: CATARACT EXTRACTION PHACO AND INTRAOCULAR LENS PLACEMENT ([place]) Left;  Surgeon: Leandrew Koyanagi, MD;  Location: [facility];  Service: Ophthalmology;  Laterality: Left;   CHOLECYSTECTOMY     COLON SURGERY     CORONARY ANGIOPLASTY WITH STENT PLACEMENT     CORONARY ARTERY  BYPASS GRAFT     CYSTOSCOPY/URETEROSCOPY/HOLMIUM LASER/STENT PLACEMENT Left 03/18/2020   Procedure: CYSTOSCOPY/URETEROSCOPY/HOLMIUM LASER/STENT PLACEMENT;  Surgeon: Billey Co, MD;  Location: [facility];  Service: Urology;  Laterality: Left;   ENDOBRONCHIAL ULTRASOUND N/A 12/31/2014   Procedure: ENDOBRONCHIAL ULTRASOUND;  Surgeon: Flora Lipps, MD;  Location: [facility];  Service: Cardiopulmonary;  Laterality: N/A;   ENTEROSCOPY N/A 12/09/2020   Procedure: ENTEROSCOPY;  Surgeon: [name], MD;  Location: [facility] ENDOSCOPY;  Service: Gastroenterology;  Laterality: N/A;   ESOPHAGOGASTRODUODENOSCOPY N/A 08/15/2020   Procedure: ESOPHAGOGASTRODUODENOSCOPY (EGD);  Surgeon: Toledo, Benay Pike, MD;  Location: [facility] ENDOSCOPY;  Service: Gastroenterology;  Laterality: N/A;   ESOPHAGOGASTRODUODENOSCOPY (EGD) WITH PROPOFOL N/A 06/20/2015   Procedure: ESOPHAGOGASTRODUODENOSCOPY (EGD) WITH PROPOFOL;  Surgeon: Lollie Sails, MD;  Location: [facility] END[facility]COPY;  Service: Endoscopy;  Laterality: N/A;   IR CATHETER TUBE CHANGE  10/26/2019   IR CHOLANGIOGRAM EXISTING TUBE  08/13/2019     reports that he has quit smoking. His smokeless tobacco use includes chew. He reports current alcohol use. He reports that he does not use drugs.  Allergies  Allergen Reactions   Levaquin [Levofloxacin] Other (See Comments)    [name], her daughter, said Levaquin should be avoided because when patient had Levaquin a few years ago, he had "a bad reaction".  She said he could not walk and could not lift up his feet.   Escitalopram Other (See Comments)    Reaction:  Makes him feel faint, like he was going to have a heart attack.   5ht3 Receptor Antagonists Other (See Comments)   Diltiazem Rash   Diphenhydramine Hcl Other (See Comments)    Reaction:  Rash and fever a long time ago, but has had it since with no problem.   Maxidex [Dexamethasone] Other (See Comments)    Reaction:  Unknown    Prednisone Other (See Comments)    Pt states that  this medication made him feel crazy.     Serotonin Other (See Comments)    Tried 2 different types, lexapro and another one.  Reaction:  Made him feel like he was having a heart attack.    Vytorin [Ezetimibe-Simvastatin] Other (See Comments)    Reaction:  Unknown    Zocor [Simvastatin] Other (See Comments)    Reaction:  Unknown     Family History  Problem Relation Age of Onset   CAD Mother    Colon cancer Father     Prior to Admission medications   Medication Sig Start Date End Date Taking? Authorizing Provider  acetaminophen (TYLENOL) 500 MG tablet Take 500 mg by mouth 2 (two) times daily as needed for mild pain or moderate pain.    [provider]  Ascorbic Acid (VITAMIN C PO) Take 1 tablet by mouth daily with breakfast.    [provider]  atorvastatin (LIPITOR) 40 MG tablet Take 1 tablet (40 mg total) by mouth daily with supper. 02/27/20   Enzo Bi, MD  carvedilol (COREG) 3.125 MG tablet Take 1 tablet (3.125 mg total) by  mouth 2 (two) times daily with a meal. 12/07/20   Fritzi Mandes, MD  digoxin (LANOXIN) 0.125 MG tablet Take 0.0625 mg by mouth daily.    [provider]  famotidine (PEPCID) 20 MG tablet Take 20 mg by mouth at bedtime. 02/04/19   [provider]  ferrous sulfate 325 (65 FE) MG tablet Take 1 tablet (325 mg total) by mouth every Monday, Wednesday, and Friday. With supper 02/29/20   Enzo Bi, MD  midodrine (PROAMATINE) 10 MG tablet Take 1 tablet (10 mg total) by mouth 3 (three) times daily with meals. 12/07/20   Fritzi Mandes, MD  Multiple Vitamin (MULTIVITAMIN) tablet Take 1 tablet by mouth daily.    [provider]  pantoprazole (PROTONIX) 40 MG tablet Take 1 tablet (40 mg total) by mouth 2 (two) times daily. 08/17/20   Lorella Nimrod, MD  polyethylene glycol (MIRALAX / GLYCOLAX) 17 g packet Take 17 g by mouth daily as needed for mild constipation. 08/17/20   Lorella Nimrod, MD  ranolazine (RANEXA) 1000 MG SR tablet Take 500 mg by  mouth 2 (two) times daily.  01/16/19   [provider]  sucralfate (CARAFATE) 1 G tablet Take 1 g by mouth 2 (two) times daily.     [provider]  tamsulosin (FLOMAX) 0.4 MG CAPS capsule Take 0.4 mg by mouth daily after supper.     [provider]  torsemide (DEMADEX) 20 MG tablet Take 1 tablet (20 mg total) by mouth daily. 12/08/20   Fritzi Mandes, MD  vitamin B-12 (CYANOCOBALAMIN) 500 MCG tablet Take 500 mcg by mouth daily.    [provider]    Physical Exam: Vitals:   12/23/20 0921 12/23/20 0930 12/23/20 1049 12/23/20 1100  BP:  114/70  101/60  Pulse:  87 71 74  Resp:  20 15 (!) 21  Temp:      TempSrc:      SpO2:  92% 92% 99%  Weight: 80.3 kg     Height: 6\' 4"  (1.93 m)       Physical Exam Vitals and nursing note reviewed.  Constitutional:      General: He is not in acute distress.    Appearance: He is not toxic-appearing or diaphoretic.     Comments: Chronically ill appearing  HENT:     Head: Normocephalic and atraumatic.  Cardiovascular:     Rate and Rhythm: Normal rate. Rhythm irregular.  Pulmonary:     Breath sounds: Examination of the right-middle field reveals decreased breath sounds and rales. Examination of the left-middle field reveals rales. Examination of the right-lower field reveals decreased breath sounds. Examination of the left-lower field reveals rales. Decreased breath sounds and rales present.  Abdominal:     General: Bowel sounds are normal.     Palpations: Abdomen is soft. There is no hepatomegaly or mass.     Tenderness: There is no abdominal tenderness.  Musculoskeletal:     Cervical back: Normal range of motion.     Right lower leg: Edema present.     Left lower leg: Edema present.  Skin:    General: Skin is warm and dry.     Capillary Refill: Capillary refill takes less than 2 seconds.  Neurological:     General: No focal deficit present.     Mental Status: He is alert and oriented to person, place, and time.      Labs on Admission: I have personally reviewed following labs and imaging studies  CBC: Recent Labs  Lab 12/20/20 0946 12/23/20 0925  WBC 6.2 6.2  NEUTROABS 2.3 2.3  HGB 7.6* 9.1*  HCT 23.5* 28.1*  MCV 100.4* 101.4*  PLT 138* 588   Basic Metabolic Panel: Recent Labs  Lab 12/23/20 0925  NA 136  K 4.5  CL 100  CO2 26  GLUCOSE 105*  BUN 43*  CREATININE 2.36*  CALCIUM 9.2   GFR: Estimated Creatinine Clearance: 25 mL/min (A) (by C-G formula based on SCr of 2.36 mg/dL (H)). Liver Function Tests: Recent Labs  Lab 12/23/20 0925  AST 24  ALT 14  ALKPHOS 68  BILITOT 1.5*  PROT 7.2  ALBUMIN 4.3   Recent Labs  Lab 12/23/20 0925  LIPASE 46   No results for input(s): AMMONIA in the last 168 hours. Coagulation Profile: No results for input(s): INR, PROTIME in the last 168 hours. Cardiac Enzymes: No results for input(s): CKTOTAL, CKMB, CKMBINDEX, TROPONINI in the last 168 hours. BNP (last 3 results) No results for input(s): PROBNP in the last 8760 hours. HbA1C: No results for input(s): HGBA1C in the last 72 hours. CBG: No results for input(s): GLUCAP in the last 168 hours. Lipid Profile: No results for input(s): CHOL, HDL, LDLCALC, TRIG, CHOLHDL, LDLDIRECT in the last 72 hours. Thyroid Function Tests: No results for input(s): TSH, T4TOTAL, FREET4, T3FREE, THYROIDAB in the last 72 hours. Anemia Panel: No results for input(s): VITAMINB12, FOLATE, FERRITIN, TIBC, IRON, RETICCTPCT in the last 72 hours. Urine analysis:    Component Value Date/Time   COLORURINE YELLOW (A) 12/09/2020 0336   APPEARANCEUR CLEAR (A) 12/09/2020 0336   LABSPEC 1.010 12/09/2020 0336   PHURINE 5.0 12/09/2020 0336   GLUCOSEU NEGATIVE 12/09/2020 0336   HGBUR NEGATIVE 12/09/2020 0336   BILIRUBINUR NEGATIVE 12/09/2020 0336   KETONESUR NEGATIVE 12/09/2020 0336   PROTEINUR NEGATIVE 12/09/2020 0336   UROBILINOGEN 0.2 09/11/2010 0647   NITRITE NEGATIVE 12/09/2020 0336   LEUKOCYTESUR NEGATIVE  12/09/2020 0336    Radiological Exams on Admission: I have personally reviewed images DG Chest 2 View  Result Date: 12/23/2020 CLINICAL DATA:  Shortness of breath. EXAM: CHEST - 2 VIEW COMPARISON:  12/09/2020 FINDINGS: Sequelae of CABG are again identified. The cardiac silhouette remains mildly enlarged. There are increased interstitial densities bilaterally, greatest in the lung bases. Hazy airspace opacities are also present in the lung bases, and there are small bilateral pleural effusions. No pneumothorax or acute osseous abnormality is seen. IMPRESSION: Basilar predominant bilateral interstitial and hazy airspace opacities with small pleural effusions compatible with edema. Electronically Signed   By: Logan Bores M.D.   On: 12/23/2020 10:19    EKG: I have personally reviewed EKG: afib   Assessment/Plan Principal Problem:   Acute on chronic combined systolic (congestive) and diastolic (congestive) heart failure (HCC) Active Problems:   MDS (myelodysplastic syndrome), low grade (HCC)   Recurrent right pleural effusion   CKD (chronic kidney disease), stage IIIb   Chronic atrial fibrillation (HCC)   BPH (benign prostatic hyperplasia)   DNR (do not resuscitate)/DNI(Do Not Intubate)    Acute on chronic combined systolic (congestive) and diastolic (congestive) heart failure ([place]) Admit to medical bed. Continue with diuresis with IV lasix 80 mg q12h. Likely, pt's Scr will be the rate limiting step to his diuresis. Pt and dtr(susa) aware that pt's Scr is worrisome and that complete diuresis to euvolemia may not be possible. Pt and dtr agreeable to repeat right side thoracentesis if needed. Pt tolerated this procedure well in July 2022.  MDS (myelodysplastic syndrome), low  grade (HCC) Chronic. Pt had PRBC transfusion 3 days ago. His current acute CHF exacerbation may be due to additional intravascular volume from his transfusion.  Recurrent right pleural effusion Bedside thoracic U/S  performed in ER by this writer shows approximately 1000 ml of right pleural fluid. Pt and dtr are agreeable to repeat thoracentesis this admission if needed to resolve pt's SOB symptoms if diuresis fails to correct it.  CKD (chronic kidney disease), stage IIIb Pt recently seen by nephrology. No plans made yet or have been discussed regarding hemodialysis.  DNR (do not resuscitate)/DNI(Do Not Intubate) Verified with pt and dtr that he wants to be DNR/DNI. They are interested in completing MOST form during this hospitalization. May need to enlist the help of palliative care or spiritual care to assist them in filling out this form.  Chronic atrial fibrillation (HCC) Chronic. Pt is not anticoagulated due to prior hx of GI bleeding.  BPH (benign prostatic hyperplasia) Stable.  DVT prophylaxis: SCDs Code Status: DNR/DNI(Do NOT Intubate) Family Communication: discussed with pt and dtr susan at bedside  Disposition Plan: return home at discharge  Consults called: none  Admission status: Observation, Med-Surg   Kristopher Oppenheim, DO [facility] 12/23/2020, 12:29 PM

## 2020-12-23 NOTE — Subjective & Objective (Signed)
CC: SOB HPI: 87 year old white male with a history of combined systolic diastolic heart failure with an EF of 25%, hypertension, CKD stage IIIb, myelodysplastic syndrome, recurrent right pleural effusion, chronic atrial fibrillation not on anticoagulation due to history of prior GI bleed, BPH who presents to the ER today with worsening shortness of breath.  Daughter states the patient has been short of breath for last several weeks.  Patient recently discharged at the end of July for about of heart failure.  Patient recently had a packed red blood cell transfusion in hematology clinic due to a low hemoglobin.  This was 3 days ago.  Patient's had worsening shortness of breath since then.  He was seen in the cardiology office yesterday and had a dose of IV Lasix.  Patient states that he urinated about 1 to 2 L overnight however patient still symptomatically short of breath.  Patient recently seen by nephrology due to CKD stage IIIb.  No plans were made yet to discuss dialysis for him yet.  In the ER, chest x-ray demonstrated pulmonary edema.  Bedside thoracic ultrasound demonstrated right pleural effusion.  Laboratory evaluation showed serum creatinine of 2.36.  Hemoglobin is improved to 9.1 from a baseline of 7.63 days ago.  BNP was elevated to 1557.  This is increased from the end of July where it was 1091.  Due to acute on chronic systolic heart failure, [facility] hospitalist contacted for admission.

## 2020-12-23 NOTE — Assessment & Plan Note (Signed)
Chronic. Pt had PRBC transfusion 3 days ago. His current acute CHF exacerbation may be due to additional intravascular volume from his transfusion.

## 2020-12-23 NOTE — Assessment & Plan Note (Signed)
Admit to medical bed. Continue with diuresis with IV lasix 80 mg q12h. Likely, pt's Scr will be the rate limiting step to his diuresis. Pt and dtr(susa) aware that pt's Scr is worrisome and that complete diuresis to euvolemia may not be possible. Pt and dtr agreeable to repeat right side thoracentesis if needed. Pt tolerated this procedure well in July 2022.

## 2020-12-23 NOTE — Assessment & Plan Note (Signed)
Bedside thoracic U/S performed in ER by this writer shows approximately 1000 ml of right pleural fluid. Pt and dtr are agreeable to repeat thoracentesis this admission if needed to resolve pt's SOB symptoms if diuresis fails to correct it.

## 2020-12-23 NOTE — Assessment & Plan Note (Signed)
Stable

## 2020-12-24 ENCOUNTER — Observation Stay
Admit: 2020-12-24 | Discharge: 2020-12-24 | Disposition: A | Discharge status: Home / Self Care | Payer: PPO | Attending: Internal Medicine | Admitting: Internal Medicine

## 2020-12-24 DIAGNOSIS — E43 Unspecified severe protein-calorie malnutrition: Secondary | ICD-10-CM | POA: Diagnosis present

## 2020-12-24 DIAGNOSIS — R0602 Shortness of breath: Secondary | ICD-10-CM | POA: Diagnosis present

## 2020-12-24 DIAGNOSIS — Z515 Encounter for palliative care: Secondary | ICD-10-CM | POA: Diagnosis not present

## 2020-12-24 DIAGNOSIS — Z87891 Personal history of nicotine dependence: Secondary | ICD-10-CM | POA: Diagnosis not present

## 2020-12-24 DIAGNOSIS — D4620 Refractory anemia with excess of blasts, unspecified: Secondary | ICD-10-CM | POA: Diagnosis not present

## 2020-12-24 DIAGNOSIS — Z6821 Body mass index (BMI) 21.0-21.9, adult: Secondary | ICD-10-CM | POA: Diagnosis not present

## 2020-12-24 DIAGNOSIS — I482 Chronic atrial fibrillation, unspecified: Secondary | ICD-10-CM | POA: Diagnosis present

## 2020-12-24 DIAGNOSIS — Z881 Allergy status to other antibiotic agents status: Secondary | ICD-10-CM | POA: Diagnosis not present

## 2020-12-24 DIAGNOSIS — K219 Gastro-esophageal reflux disease without esophagitis: Secondary | ICD-10-CM | POA: Diagnosis present

## 2020-12-24 DIAGNOSIS — I509 Heart failure, unspecified: Secondary | ICD-10-CM

## 2020-12-24 DIAGNOSIS — Z9049 Acquired absence of other specified parts of digestive tract: Secondary | ICD-10-CM | POA: Diagnosis not present

## 2020-12-24 DIAGNOSIS — Z87442 Personal history of urinary calculi: Secondary | ICD-10-CM | POA: Diagnosis not present

## 2020-12-24 DIAGNOSIS — I252 Old myocardial infarction: Secondary | ICD-10-CM | POA: Diagnosis not present

## 2020-12-24 DIAGNOSIS — Z951 Presence of aortocoronary bypass graft: Secondary | ICD-10-CM | POA: Diagnosis not present

## 2020-12-24 DIAGNOSIS — I130 Hypertensive heart and chronic kidney disease with heart failure and stage 1 through stage 4 chronic kidney disease, or unspecified chronic kidney disease: Secondary | ICD-10-CM | POA: Diagnosis present

## 2020-12-24 DIAGNOSIS — N400 Enlarged prostate without lower urinary tract symptoms: Secondary | ICD-10-CM | POA: Diagnosis present

## 2020-12-24 DIAGNOSIS — D469 Myelodysplastic syndrome, unspecified: Secondary | ICD-10-CM | POA: Diagnosis present

## 2020-12-24 DIAGNOSIS — D696 Thrombocytopenia, unspecified: Secondary | ICD-10-CM | POA: Diagnosis present

## 2020-12-24 DIAGNOSIS — N1832 Chronic kidney disease, stage 3b: Secondary | ICD-10-CM | POA: Diagnosis present

## 2020-12-24 DIAGNOSIS — I5043 Acute on chronic combined systolic (congestive) and diastolic (congestive) heart failure: Secondary | ICD-10-CM | POA: Diagnosis present

## 2020-12-24 DIAGNOSIS — R54 Age-related physical debility: Secondary | ICD-10-CM | POA: Diagnosis present

## 2020-12-24 DIAGNOSIS — Z20822 Contact with and (suspected) exposure to covid-19: Secondary | ICD-10-CM | POA: Diagnosis present

## 2020-12-24 DIAGNOSIS — J9601 Acute respiratory failure with hypoxia: Secondary | ICD-10-CM | POA: Diagnosis not present

## 2020-12-24 DIAGNOSIS — Z66 Do not resuscitate: Secondary | ICD-10-CM | POA: Diagnosis present

## 2020-12-24 DIAGNOSIS — I25119 Atherosclerotic heart disease of native coronary artery with unspecified angina pectoris: Secondary | ICD-10-CM | POA: Diagnosis present

## 2020-12-24 DIAGNOSIS — Z7189 Other specified counseling: Secondary | ICD-10-CM | POA: Diagnosis not present

## 2020-12-24 DIAGNOSIS — F419 Anxiety disorder, unspecified: Secondary | ICD-10-CM | POA: Diagnosis present

## 2020-12-24 LAB — COMPREHENSIVE METABOLIC PANEL
ALT: 14 U/L (ref 0–44)
AST: 21 U/L (ref 15–41)
Albumin: 3.9 g/dL (ref 3.5–5.0)
Alkaline Phosphatase: 68 U/L (ref 38–126)
Anion gap: 12 (ref 5–15)
BUN: 44 mg/dL — ABNORMAL HIGH (ref 8–23)
CO2: 26 mmol/L (ref 22–32)
Calcium: 9.1 mg/dL (ref 8.9–10.3)
Chloride: 98 mmol/L (ref 98–111)
Creatinine, Ser: 2.4 mg/dL — ABNORMAL HIGH (ref 0.61–1.24)
GFR, Estimated: 25 mL/min — ABNORMAL LOW (ref >60)
Glucose, Bld: 91 mg/dL (ref 70–99)
Potassium: 4.3 mmol/L (ref 3.5–5.1)
Sodium: 136 mmol/L (ref 135–145)
Total Bilirubin: 1.4 mg/dL — ABNORMAL HIGH (ref 0.3–1.2)
Total Protein: 6.7 g/dL (ref 6.5–8.1)

## 2020-12-24 LAB — CBC WITH DIFFERENTIAL/PLATELET
Abs Immature Granulocytes: 0.04 10*3/uL (ref 0.00–0.07)
Basophils Absolute: 0 10*3/uL (ref 0.0–0.1)
Basophils Relative: 0 %
Eosinophils Absolute: 0 10*3/uL (ref 0.0–0.5)
Eosinophils Relative: 0 %
HCT: 26.2 % — ABNORMAL LOW (ref 39.0–52.0)
Hemoglobin: 8.7 g/dL — ABNORMAL LOW (ref 13.0–17.0)
Immature Granulocytes: 1 %
Lymphocytes Relative: 20 %
Lymphs Abs: 1.4 10*3/uL (ref 0.7–4.0)
MCH: 33.3 pg (ref 26.0–34.0)
MCHC: 33.2 g/dL (ref 30.0–36.0)
MCV: 100.4 fL — ABNORMAL HIGH (ref 80.0–100.0)
Monocytes Absolute: 2.8 10*3/uL — ABNORMAL HIGH (ref 0.1–1.0)
Monocytes Relative: 38 %
Neutro Abs: 3 10*3/uL (ref 1.7–7.7)
Neutrophils Relative %: 41 %
Platelets: 143 10*3/uL — ABNORMAL LOW (ref 150–400)
RBC: 2.61 MIL/uL — ABNORMAL LOW (ref 4.22–5.81)
RDW: 18.6 % — ABNORMAL HIGH (ref 11.5–15.5)
WBC: 7.3 10*3/uL (ref 4.0–10.5)
nRBC: 0 % (ref 0.0–0.2)

## 2020-12-24 LAB — ECHOCARDIOGRAM COMPLETE
AR max vel: 1.28 cm2
AV Area VTI: 1.36 cm2
AV Area mean vel: 1.36 cm2
AV Mean grad: 11.5 mmHg
AV Peak grad: 18.7 mmHg
Ao pk vel: 2.17 m/s
Area-P 1/2: 4.31 cm2
Height: 76 in
MV VTI: 1.44 cm2
S' Lateral: 5.92 cm
Weight: 2708.8 oz

## 2020-12-24 LAB — MAGNESIUM: Magnesium: 1.6 mg/dL — ABNORMAL LOW (ref 1.7–2.4)

## 2020-12-24 MED ORDER — MAGNESIUM SULFATE 2 GM/50ML IV SOLN
2.0000 g | Freq: Once | INTRAVENOUS | Status: AC
  Administered 2020-12-24: 2 g via INTRAVENOUS
  Filled 2020-12-24: qty 50

## 2020-12-24 MED ORDER — FUROSEMIDE 10 MG/ML IJ SOLN
40.0000 mg | Freq: Two times a day (BID) | INTRAMUSCULAR | Status: DC
  Administered 2020-12-24 – 2020-12-28 (×9): 40 mg via INTRAVENOUS
  Filled 2020-12-24 (×9): qty 4

## 2020-12-24 MED ORDER — PERFLUTREN LIPID MICROSPHERE
1.0000 mL | INTRAVENOUS | Status: AC | PRN
  Administered 2020-12-24: 3 mL via INTRAVENOUS
  Filled 2020-12-24: qty 10

## 2020-12-24 NOTE — Progress Notes (Signed)
   12/23/20 1855  Clinical Encounter Type  Visited With Patient  Visit Type Initial;Social support  Referral From Physician  Consult/Referral To Physician   [facility] visited pt. briefly per OR for assistance with MOST form.  Pt. recently admitted to 2A and being settled in his room by medical staff.  Pt. shares no needs at this time and appeared to be in relatively good spirits.  [facility] sent secure chat to MD explaining chaplains are not able to complete MOST forms with patients as this is out of our scope of practice, but chaplains remain available to assist with any support needs or discussions re: advanced directives.

## 2020-12-24 NOTE — Progress Notes (Addendum)
PROGRESS NOTE    Andres Gutierrez  NLZ:[id number] DOB: Mar 03, 1931 DOA: 12/23/2020 PCP: Rusty Aus, MD   Assessment & Plan:   Principal Problem:   Acute on chronic combined systolic (congestive) and diastolic (congestive) heart failure (HCC) Active Problems:   Chronic atrial fibrillation (HCC)   BPH (benign prostatic hyperplasia)   CKD (chronic kidney disease), stage IIIb   MDS (myelodysplastic syndrome), low grade (HCC)   Recurrent right pleural effusion   DNR (do not resuscitate)/DNI(Do Not Intubate)   Acute combined systolic (congestive) and diastolic (congestive) heart failure (HCC)  Acute on chronic combined CHF exacerbation: continue on IV lasix. Monitor I/Os and daily weights. Echo ordered. Cardio consulted   MDS: chronic. S/p pRBC transfusion 3 days ago. Likely etiology on anemia & thrombocytopenia. Will also check B12 & folate levels as MCV is elevated  Recurrent right pleural effusion: small as per CXR. May need a thoracentesis  CKDIIIb: Cr is trending up from day prior. Avoid nephrotoxic meds  Chronic a. fib: continue on coreg, digoxin. Not on anticoagulation secondary to hx of GI bleeding  BPH: continue on home dose of tamsulosin   Hypomagnesemia: mg sulfate ordered.   DVT prophylaxis: SCDs Code Status: DNR Family Communication: discussed pt's care w/ pt's daughter and answered her questions  Disposition Plan: depends on PT/OT recs (not consulted yet)  Level of care: Med-Surg  Status is: Inpatient  Remains inpatient appropriate because:Unsafe d/c plan, IV treatments appropriate due to intensity of illness or inability to take PO, and Inpatient level of care appropriate due to severity of illness  Dispo: The patient is from: Home              Anticipated d/c is to: Home              Patient currently is not medically stable to d/c.   Difficult to place patient unclear   Consultants:  cardio  Procedures:  Antimicrobials:   Subjective: Pt c/o  shortness of breath, improved from day prior   Objective: Vitals:   12/23/20 1757 12/23/20 1815 12/23/20 2000 12/24/20 0400  BP: 103/68  (!) 100/58 (!) 97/53  Pulse: 68  74 80  Resp: 17  19 19   Temp: 98.1 F (36.7 C)  97.8 F (36.6 C) 97.6 F (36.4 C)  TempSrc:   Oral Oral  SpO2:   98% 92%  Weight:  76.8 kg    Height:  6\' 4"  (1.93 m)      Intake/Output Summary (Last 24 hours) at 12/24/2020 0807 Last data filed at 12/24/2020 0400 Gross per 24 hour  Intake --  Output 800 ml  Net -800 ml   Filed Weights   12/23/20 0921 12/23/20 1815  Weight: 80.3 kg 76.8 kg    Examination:  General exam: Appears calm but uncomfortable. Frail appearing   Respiratory system: diminished breath sounds b/l  Cardiovascular system: S1 & S2 +. No rubs, gallops or clicks.  Gastrointestinal system: Abdomen is nondistended, soft and nontender. Normal bowel sounds heard. Central nervous system: Alert and oriented. Moves all extremities  Psychiatry: Judgement and insight appear normal. Flat mood and affect    Data Reviewed: I have personally reviewed following labs and imaging studies  CBC: Recent Labs  Lab 12/20/20 0946 12/23/20 0925 12/24/20 0411  WBC 6.2 6.2 7.3  NEUTROABS 2.3 2.3 3.0  HGB 7.6* 9.1* 8.7*  HCT 23.5* 28.1* 26.2*  MCV 100.4* 101.4* 100.4*  PLT 138* 154 902*   Basic Metabolic Panel: Recent Labs  Lab 12/23/20 0925 12/24/20 0411  NA 136 136  K 4.5 4.3  CL 100 98  CO2 26 26  GLUCOSE 105* 91  BUN 43* 44*  CREATININE 2.36* 2.40*  CALCIUM 9.2 9.1  MG  --  1.6*   GFR: Estimated Creatinine Clearance: 23.6 mL/min (A) (by C-G formula based on SCr of 2.4 mg/dL (H)). Liver Function Tests: Recent Labs  Lab 12/23/20 0925 12/24/20 0411  AST 24 21  ALT 14 14  ALKPHOS 68 68  BILITOT 1.5* 1.4*  PROT 7.2 6.7  ALBUMIN 4.3 3.9   Recent Labs  Lab 12/23/20 0925  LIPASE 46   No results for input(s): AMMONIA in the last 168 hours. Coagulation Profile: No results for  input(s): INR, PROTIME in the last 168 hours. Cardiac Enzymes: No results for input(s): CKTOTAL, CKMB, CKMBINDEX, TROPONINI in the last 168 hours. BNP (last 3 results) No results for input(s): PROBNP in the last 8760 hours. HbA1C: No results for input(s): HGBA1C in the last 72 hours. CBG: No results for input(s): GLUCAP in the last 168 hours. Lipid Profile: No results for input(s): CHOL, HDL, LDLCALC, TRIG, CHOLHDL, LDLDIRECT in the last 72 hours. Thyroid Function Tests: No results for input(s): TSH, T4TOTAL, FREET4, T3FREE, THYROIDAB in the last 72 hours. Anemia Panel: No results for input(s): VITAMINB12, FOLATE, FERRITIN, TIBC, IRON, RETICCTPCT in the last 72 hours. Sepsis Labs: No results for input(s): PROCALCITON, LATICACIDVEN in the last 168 hours.  Recent Results (from the past 240 hour(s))  Resp Panel by RT-PCR (Flu A&B, Covid) Nasopharyngeal Swab     Status: None   Collection Time: 12/23/20 11:02 AM   Specimen: Nasopharyngeal Swab; Nasopharyngeal(NP) swabs in vial transport medium  Result Value Ref Range Status   SARS Coronavirus 2 by RT PCR NEGATIVE NEGATIVE Final    Comment: (NOTE) SARS-CoV-2 target nucleic acids are NOT DETECTED.  The SARS-CoV-2 RNA is generally detectable in upper respiratory specimens during the acute phase of infection. The lowest concentration of SARS-CoV-2 viral copies this assay can detect is 138 copies/mL. A negative result does not preclude SARS-Cov-2 infection and should not be used as the sole basis for treatment or other patient management decisions. A negative result may occur with  improper specimen collection/handling, submission of specimen other than nasopharyngeal swab, presence of viral mutation(s) within the areas targeted by this assay, and inadequate number of viral copies(<138 copies/mL). A negative result must be combined with clinical observations, patient history, and epidemiological information. The expected result is  Negative.  Fact Sheet for Patients:  EntrepreneurPulse.com.au  Fact Sheet for Healthcare Providers:  IncredibleEmployment.be  This test is no t yet approved or cleared by the [place] FDA and  has been authorized for detection and/or diagnosis of SARS-CoV-2 by FDA under an Emergency Use Authorization (EUA). This EUA will remain  in effect (meaning this test can be used) for the duration of the COVID-19 declaration under Section 564(b)(1) of the Act, 21 U.S.C.section 360bbb-3(b)(1), unless the authorization is terminated  or revoked sooner.       Influenza A by PCR NEGATIVE NEGATIVE Final   Influenza B by PCR NEGATIVE NEGATIVE Final    Comment: (NOTE) The Xpert Xpress SARS-CoV-2/FLU/RSV plus assay is intended as an aid in the diagnosis of influenza from Nasopharyngeal swab specimens and should not be used as a sole basis for treatment. Nasal washings and aspirates are unacceptable for Xpert Xpress SARS-CoV-2/FLU/RSV testing.  Fact Sheet for Patients: EntrepreneurPulse.com.au  Fact Sheet for Healthcare Providers: IncredibleEmployment.be  This test is not yet approved or cleared by the [place] and has been authorized for detection and/or diagnosis of SARS-CoV-2 by FDA under an Emergency Use Authorization (EUA). This EUA will remain in effect (meaning this test can be used) for the duration of the COVID-19 declaration under Section 564(b)(1) of the Act, 21 U.S.C. section 360bbb-3(b)(1), unless the authorization is terminated or revoked.  Performed at [facility], [address] [place], [place] [address]          Radiology Studies: DG Chest 2 View  Result Date: 12/23/2020 CLINICAL DATA:  Shortness of breath. EXAM: CHEST - 2 VIEW COMPARISON:  12/09/2020 FINDINGS: Sequelae of CABG are again identified. The cardiac silhouette remains mildly enlarged. There are increased  interstitial densities bilaterally, greatest in the lung bases. Hazy airspace opacities are also present in the lung bases, and there are small bilateral pleural effusions. No pneumothorax or acute osseous abnormality is seen. IMPRESSION: Basilar predominant bilateral interstitial and hazy airspace opacities with small pleural effusions compatible with edema. Electronically Signed   By: Logan Bores M.D.   On: 12/23/2020 10:19        Scheduled Meds:  atorvastatin  40 mg Oral Q supper   carvedilol  3.125 mg Oral BID WC   digoxin  0.0625 mg Oral Daily   famotidine  10 mg Oral QODAY   furosemide  40 mg Intravenous Q12H   midodrine  10 mg Oral TID WC   pantoprazole  40 mg Oral BID   ranolazine  500 mg Oral BID   sucralfate  1 g Oral BID   tamsulosin  0.4 mg Oral QPC supper   Continuous Infusions:   LOS: 0 days    Time spent:33 mins     Wyvonnia Dusky, MD [facility] Pager [phone]  If 7PM-7AM, please contact night-coverage 12/24/2020, 8:07 AM

## 2020-12-25 LAB — BASIC METABOLIC PANEL
Anion gap: 11 (ref 5–15)
BUN: 45 mg/dL — ABNORMAL HIGH (ref 8–23)
CO2: 27 mmol/L (ref 22–32)
Calcium: 9.3 mg/dL (ref 8.9–10.3)
Chloride: 98 mmol/L (ref 98–111)
Creatinine, Ser: 2.36 mg/dL — ABNORMAL HIGH (ref 0.61–1.24)
GFR, Estimated: 26 mL/min — ABNORMAL LOW (ref >60)
Glucose, Bld: 104 mg/dL — ABNORMAL HIGH (ref 70–99)
Potassium: 4.2 mmol/L (ref 3.5–5.1)
Sodium: 136 mmol/L (ref 135–145)

## 2020-12-25 LAB — CBC
HCT: 28.4 % — ABNORMAL LOW (ref 39.0–52.0)
Hemoglobin: 9.2 g/dL — ABNORMAL LOW (ref 13.0–17.0)
MCH: 32.4 pg (ref 26.0–34.0)
MCHC: 32.4 g/dL (ref 30.0–36.0)
MCV: 100 fL (ref 80.0–100.0)
Platelets: 144 10*3/uL — ABNORMAL LOW (ref 150–400)
RBC: 2.84 MIL/uL — ABNORMAL LOW (ref 4.22–5.81)
RDW: 18 % — ABNORMAL HIGH (ref 11.5–15.5)
WBC: 5.9 10*3/uL (ref 4.0–10.5)
nRBC: 0 % (ref 0.0–0.2)

## 2020-12-25 LAB — MAGNESIUM: Magnesium: 1.9 mg/dL (ref 1.7–2.4)

## 2020-12-25 LAB — VITAMIN B12: Vitamin B-12: 1875 pg/mL — ABNORMAL HIGH (ref 180–914)

## 2020-12-25 LAB — FOLATE: Folate: 30 ng/mL (ref >5.9)

## 2020-12-25 NOTE — Consult Note (Signed)
CARDIOLOGY CONSULT NOTE               Patient ID: Andres Gutierrez MRN: [id number] DOB/AGE: 1931-04-01 87 y.o.  Admit date: 12/23/2020 Referring Physician Dr. Sherilyn Dacosta hospitalist Primary Physician Dr. Emily Filbert primary Primary Cardiologist Dr. Jordan Hawks Reason for Consultation shortness of breath congestive heart failure cardiomyopathy pleural effusion  HPI: Patient 87 year old male with combined systolic diastolic heart failure EF around 25% chronic insufficiency stage III myelodysplastic syndrome recurrent right pleural effusion chronic atrial fibrillation but poor candidate for anticoagulation because of GI bleeding patient presented to the ER with worsening shortness of breath and seen Dr. Ubaldo Glassing as an outpatient was sent for IV Lasix therapy but it was unsuccessful because he continued to have dyspnea and she came to the emergency room and was admitted for further management.  Cardiology was consulted to help with heart failure management.  BNP of 1500 slightly elevated from recently in July of around 1000.  Patient denies any chest pain  Review of systems complete and found to be negative unless listed above     Past Medical History:  Diagnosis Date   Acalculous cholecystitis 10/29/2015   Acute on chronic heart failure ([place]) 06/07/2019   Anemia    Anginal pain (HCC)    Anxiety    Atrial fibrillation with RVR ([place]) 12/23/2015   Cardiomyopathy (HCC)    Cerebral amyloid angiopathy (HCC)    CHF (congestive heart failure) (HCC)    Chronic kidney disease    kidney stones   Coronary artery disease    Cough    Dyspnea    GERD (gastroesophageal reflux disease)    GERD (gastroesophageal reflux disease) 12/23/2015   History of kidney stones    Hypertension    Lymphadenopathy, hilar    MDS (myelodysplastic syndrome) (HCC)    MI (myocardial infarction) ([place])    x 2   Wheezing     Past Surgical History:  Procedure Laterality Date   BRONCHIAL NEEDLE ASPIRATION BIOPSY N/A  12/31/2014   Procedure: BRONCHIAL NEEDLE ASPIRATION BIOPSIES from [place];  Surgeon: Flora Lipps, MD;  Location: [facility];  Service: Cardiopulmonary;  Laterality: N/A;   CARDIAC CATHETERIZATION N/A 12/28/2015   Procedure: Left Heart Cath and Cors/Grafts Angiography;  Surgeon: Yolonda Kida, MD;  Location: [facility];  Service: Cardiovascular;  Laterality: N/A;   CATARACT EXTRACTION W/PHACO Left 08/01/2016   Procedure: CATARACT EXTRACTION PHACO AND INTRAOCULAR LENS PLACEMENT ([place]) Left;  Surgeon: Leandrew Koyanagi, MD;  Location: [facility];  Service: Ophthalmology;  Laterality: Left;   CHOLECYSTECTOMY     COLON SURGERY     CORONARY ANGIOPLASTY WITH STENT PLACEMENT     CORONARY ARTERY BYPASS GRAFT     CYSTOSCOPY/URETEROSCOPY/HOLMIUM LASER/STENT PLACEMENT Left 03/18/2020   Procedure: CYSTOSCOPY/URETEROSCOPY/HOLMIUM LASER/STENT PLACEMENT;  Surgeon: Billey Co, MD;  Location: [facility];  Service: Urology;  Laterality: Left;   ENDOBRONCHIAL ULTRASOUND N/A 12/31/2014   Procedure: ENDOBRONCHIAL ULTRASOUND;  Surgeon: Flora Lipps, MD;  Location: [facility];  Service: Cardiopulmonary;  Laterality: N/A;   ENTEROSCOPY N/A 12/09/2020   Procedure: ENTEROSCOPY;  Surgeon: [name], MD;  Location: [facility] ENDOSCOPY;  Service: Gastroenterology;  Laterality: N/A;   ESOPHAGOGASTRODUODENOSCOPY N/A 08/15/2020   Procedure: ESOPHAGOGASTRODUODENOSCOPY (EGD);  Surgeon: Toledo, Benay Pike, MD;  Location: [facility] ENDOSCOPY;  Service: Gastroenterology;  Laterality: N/A;   ESOPHAGOGASTRODUODENOSCOPY (EGD) WITH PROPOFOL N/A 06/20/2015   Procedure: ESOPHAGOGASTRODUODENOSCOPY (EGD) WITH PROPOFOL;  Surgeon: Lollie Sails, MD;  Location: [facility] ENDOSCOPY;  Service: Endoscopy;  Laterality: N/A;   IR CATHETER TUBE CHANGE  10/26/2019   IR CHOLANGIOGRAM EXISTING TUBE  08/13/2019    Medications Prior to Admission  Medication Sig Dispense Refill Last Dose   Ascorbic Acid (VITAMIN C PO) Take 1 tablet by mouth daily with  breakfast.   12/22/2020 at 0800   atorvastatin (LIPITOR) 40 MG tablet Take 1 tablet (40 mg total) by mouth daily with supper.   12/22/2020 at 1800   carvedilol (COREG) 3.125 MG tablet Take 1 tablet (3.125 mg total) by mouth 2 (two) times daily with a meal. 60 tablet 1 12/22/2020 at 1800   digoxin (LANOXIN) 0.125 MG tablet Take 0.0625 mg by mouth daily.   12/22/2020 at 0800   famotidine (PEPCID) 20 MG tablet Take 20 mg by mouth at bedtime.   12/22/2020 at 2000   midodrine (PROAMATINE) 10 MG tablet Take 1 tablet (10 mg total) by mouth 3 (three) times daily with meals. 90 tablet 0 12/22/2020 at 1800   Multiple Vitamin (MULTIVITAMIN) tablet Take 1 tablet by mouth daily.   12/22/2020 at 0800   pantoprazole (PROTONIX) 40 MG tablet Take 1 tablet (40 mg total) by mouth 2 (two) times daily. 60 tablet 1 12/22/2020 at 1800   ranolazine (RANEXA) 1000 MG SR tablet Take 500 mg by mouth 2 (two) times daily.    12/22/2020 at 1800   sucralfate (CARAFATE) 1 G tablet Take 1 g by mouth 2 (two) times daily.    12/22/2020 at 1800   tamsulosin (FLOMAX) 0.4 MG CAPS capsule Take 0.4 mg by mouth daily after supper.    12/22/2020 at 1830   torsemide (DEMADEX) 20 MG tablet Take 1 tablet (20 mg total) by mouth daily. 30 tablet 1 12/22/2020 at 0800   vitamin B-12 (CYANOCOBALAMIN) 500 MCG tablet Take 500 mcg by mouth daily.   12/22/2020 at 0800   acetaminophen (TYLENOL) 500 MG tablet Take 500 mg by mouth 2 (two) times daily as needed for mild pain or moderate pain.   unknown at oprn   ferrous sulfate 325 (65 FE) MG tablet Take 1 tablet (325 mg total) by mouth every Monday, Wednesday, and Friday. With supper   12/21/2020 at 1800   polyethylene glycol (MIRALAX / GLYCOLAX) 17 g packet Take 17 g by mouth daily as needed for mild constipation. (Patient not taking: Reported on 12/23/2020) 14 each 0 Not Taking   Social History   Socioeconomic History   Marital status: Widowed    Spouse name: Not on file   Number of children: 2   Years of  education: college   Highest education level: Some college, no degree  Occupational History   Occupation: retired  Tobacco Use   Smoking status: Former   Smokeless tobacco: Current    Types: Nurse, children's Use: Never used  Substance and Sexual Activity   Alcohol use: Yes    Comment: occational almost rare once a year   Drug use: No   Sexual activity: Not Currently    Birth control/protection: Abstinence  Other Topics Concern   Not on file  Social History Narrative   Not on file   Social Determinants of Health   Financial Resource Strain: Not on file  Food Insecurity: Not on file  Transportation Needs: Not on file  Physical Activity: Not on file  Stress: Not on file  Social Connections: Not on file  Intimate Partner Violence: Not on file    Family History  Problem Relation Age of  Onset   CAD Mother    Colon cancer Father       Review of systems complete and found to be negative unless listed above      PHYSICAL EXAM  General: Well developed, well nourished, in no acute distress HEENT:  Normocephalic and atramatic Neck:  No JVD.  Lungs: Clear bilaterally to auscultation and percussion.  Dullness right base Heart: HRRR . Normal S1 and S2 S3 gallops or murmurs.  Abdomen: Bowel sounds are positive, abdomen soft and non-tender  Msk:  Back normal, normal gait. Normal strength and tone for age. Extremities: No clubbing, cyanosis or edema.   Neuro: Alert and oriented X 3. Psych:  Good affect, responds appropriately  Labs:   Lab Results  Component Value Date   WBC 5.9 12/25/2020   HGB 9.2 (L) 12/25/2020   HCT 28.4 (L) 12/25/2020   MCV 100.0 12/25/2020   PLT 144 (L) 12/25/2020    Recent Labs  Lab 12/24/20 0411 12/25/20 0529  NA 136 136  K 4.3 4.2  CL 98 98  CO2 26 27  BUN 44* 45*  CREATININE 2.40* 2.36*  CALCIUM 9.1 9.3  PROT 6.7  --   BILITOT 1.4*  --   ALKPHOS 68  --   ALT 14  --   AST 21  --   GLUCOSE 91 104*   Lab Results   Component Value Date   CKTOTAL 139 09/11/2010   CKMB 1.8 09/11/2010   TROPONINI 0.03 (HH) 02/25/2017    Lab Results  Component Value Date   CHOL 71 12/09/2020   CHOL 74 10/04/2020   CHOL 79 02/27/2020   Lab Results  Component Value Date   HDL 27 (L) 12/09/2020   HDL 19 (L) 10/04/2020   HDL 18 (L) 02/27/2020   Lab Results  Component Value Date   LDLCALC 30 12/09/2020   LDLCALC 41 10/04/2020   LDLCALC 38 02/27/2020   Lab Results  Component Value Date   TRIG 70 12/09/2020   TRIG 72 10/04/2020   TRIG 116 02/27/2020   Lab Results  Component Value Date   CHOLHDL 2.6 12/09/2020   CHOLHDL 3.9 10/04/2020   CHOLHDL 4.4 02/27/2020   No results found for: LDLDIRECT    Radiology: CT ABDOMEN PELVIS WO CONTRAST  Result Date: 12/09/2020 CLINICAL DATA:  Abdominal pain, acute (Ped 0-18y) EXAM: CT ABDOMEN AND PELVIS WITHOUT CONTRAST TECHNIQUE: Multidetector CT imaging of the abdomen and pelvis was performed following the standard protocol without IV contrast. COMPARISON:  12/05/2020, MRI June 2017 FINDINGS: Lower chest: Decreased but persistent bilateral pleural effusions, right greater than left. Adjacent bibasilar atelectasis. Unchanged cardiomegaly. Hepatobiliary: No focal liver abnormality is seen. Prior cholecystectomy. No biliary dilation. Pancreas: Mildly atrophic. No pancreatic ductal dilatation or surrounding inflammatory changes. Spleen: Normal in size without focal abnormality. Adrenals/Urinary Tract: Adrenal glands are unremarkable. Unchanged nonobstructive 2-3 mm left upper pole renal stone. The previously seen punctate right renal stone is not well seen likely due to slice selection. Unchanged bilateral renal cysts which are incompletely evaluated without intravenous contrast. The largest again measures 2.8 cm in the lateral upper pole of the left kidney. There is no hydronephrosis. The bladder is unremarkable. Stomach/Bowel: The stomach is within normal limits. There is no  evidence of bowel obstruction. Postsurgical changes of right hemicolectomy with ileocolonic anastomosis. Moderate colonic stool burden. Vascular/Lymphatic: Aortoiliac atherosclerotic calcifications. No AAA. Reproductive: Unremarkable. Other: No abdominopelvic ascites.  No hernia. Musculoskeletal: Multilevel degenerative changes of the spine, moderate to severe  in the lumbar spine. Unchanged grade 1 anterolisthesis at L4-L5. There are bilateral hip degenerative changes with unchanged mineralization. There is a lucent lesion within the left femoral neck compatible with a benign intraosseous lipoma, as seen on prior MRI in June 2017. IMPRESSION: No acute abdominopelvic abnormality. Unchanged nonobstructive 2-3 mm left upper pole renal stone. Decreased but persistent bilateral pleural effusions, right greater than left with adjacent bibasilar atelectasis. Electronically Signed   By: Maurine Simmering   On: 12/09/2020 12:49   DG Chest 2 View  Result Date: 12/23/2020 CLINICAL DATA:  Shortness of breath. EXAM: CHEST - 2 VIEW COMPARISON:  12/09/2020 FINDINGS: Sequelae of CABG are again identified. The cardiac silhouette remains mildly enlarged. There are increased interstitial densities bilaterally, greatest in the lung bases. Hazy airspace opacities are also present in the lung bases, and there are small bilateral pleural effusions. No pneumothorax or acute osseous abnormality is seen. IMPRESSION: Basilar predominant bilateral interstitial and hazy airspace opacities with small pleural effusions compatible with edema. Electronically Signed   By: Logan Bores M.D.   On: 12/23/2020 10:19   DG Chest 2 View  Result Date: 12/02/2020 CLINICAL DATA:  CHF. EXAM: CHEST - 2 VIEW COMPARISON:  Chest radiograph 11/29/2020. FINDINGS: Post median sternotomy and CABG. Cardiomegaly is not significantly changed. Persistent but improving pulmonary edema. Persistent but improving bilateral pleural effusions, right greater than left.  Associated basilar opacities consistent with compressive atelectasis. There is fluid in the fissures. No pneumothorax or confluent airspace disease. Stable osseous structures. IMPRESSION: Improving CHF with persistent but improving pulmonary edema and bilateral pleural effusions. Cardiomegaly is not significantly changed. Electronically Signed   By: Keith Rake M.D.   On: 12/02/2020 10:06   DG Chest 2 View  Result Date: 11/26/2020 CLINICAL DATA:  Congestive heart failure.  Weight gain. EXAM: CHEST - 2 VIEW COMPARISON:  None. FINDINGS: Normal cardiac silhouette. Bilateral small effusions and basilar atelectasis similar prior. No pulmonary edema. IMPRESSION: Atelectasis and bibasilar effusions.  No pulmonary edema. Electronically Signed   By: Suzy Bouchard M.D.   On: 11/26/2020 15:38   MR BRAIN WO CONTRAST  Result Date: 12/09/2020 CLINICAL DATA:  Initial evaluation for neuro deficit, stroke suspected. EXAM: MRI HEAD WITHOUT CONTRAST TECHNIQUE: Multiplanar, multiecho pulse sequences of the brain and surrounding structures were obtained without intravenous contrast. COMPARISON:  Prior MRI from 11/22/2020. FINDINGS: Brain: Moderately advanced age-related cerebral atrophy with chronic microvascular ischemic disease. Remote lacunar infarct noted at the right posterior lentiform nucleus. Small remote left temporal occipital infarct with associated chronic hemosiderin staining. Additional small remote right occipital cortical infarct. Innumerable scattered chronic micro hemorrhages noted involving the posterior cerebral hemispheres and cerebellum, nonspecific, but favored to be related to chronic poorly controlled hypertension. Overall, appearance is stable from prior. No abnormal foci of restricted diffusion to suggest acute or subacute ischemia. Gray-white matter differentiation otherwise maintained. No acute intracranial hemorrhage. No mass lesion, midline shift or mass effect. No hydrocephalus or  extra-axial fluid collection. Pituitary gland suprasellar region normal. Vascular: Major intracranial vascular flow voids are maintained. Skull and upper cervical spine: Craniocervical junction within normal limits. Degenerative spondylosis at C2-3 without high-grade spinal stenosis noted. Diffusely decreased T1 signal intensity seen within the visualized bone marrow, nonspecific, but most commonly related to anemia, smoking, or obesity. No focal marrow replacing lesions. No scalp soft tissue abnormality. Sinuses/Orbits: Prior bilateral ocular lens replacement. Globes and orbital soft tissues demonstrate no acute finding. Mild scattered mucosal thickening noted within the ethmoidal air cells and maxillary  sinuses. Trace left mastoid effusion noted, of doubtful significance. Other: None. IMPRESSION: 1. No acute intracranial abnormality. 2. Remote left temporoccipital and right occipital cortical infarcts. 3. Underlying moderately advanced cerebral atrophy with chronic microvascular ischemic disease. 4. Innumerable scattered chronic micro hemorrhages involving the posterior cerebral hemispheres and cerebellum, nonspecific, but favored to be related to chronic poorly controlled hypertension. Electronically Signed   By: Jeannine Boga M.D.   On: 12/09/2020 23:35   [place] CHEST (PLEURAL EFFUSION)  Result Date: 12/07/2020 CLINICAL DATA:  Interventional radiology consulted for left thoracentesis. EXAM: CHEST ULTRASOUND COMPARISON:  None. FINDINGS: No significant left pleural effusion. IMPRESSION: No significant left pleural effusion.  No thoracentesis performed. Electronically Signed   By: Miachel Roux M.D.   On: 12/07/2020 07:23   DG Chest Port 1 View  Result Date: 12/09/2020 CLINICAL DATA:  Weakness and dizziness. EXAM: PORTABLE CHEST 1 VIEW COMPARISON:  December 05, 2020 FINDINGS: Multiple sternal wires and vascular clips are seen. Stable, diffusely increased interstitial lung markings are noted. There is no  evidence of a pleural effusion or pneumothorax. The cardiac silhouette is enlarged and unchanged in size. A chronic sixth left rib fracture is seen. Degenerative changes seen throughout the thoracic spine. IMPRESSION: Stable exam without significant interval change when compared to the prior chest plain film, dated December 05, 2020. Electronically Signed   By: Virgina Norfolk M.D.   On: 12/09/2020 03:41   DG Chest Port 1 View  Result Date: 12/05/2020 CLINICAL DATA:  Status post right thoracentesis. EXAM: PORTABLE CHEST 1 VIEW COMPARISON:  December 02, 2020. FINDINGS: Stable cardiomegaly. Sternotomy wires are noted. No definite pneumothorax is noted status post thoracentesis. Pleural effusion appears to be significantly smaller. IMPRESSION: No definite pneumothorax status post right thoracentesis. Electronically Signed   By: Marijo Conception M.D.   On: 12/05/2020 11:46   DG Chest Port 1 View  Result Date: 11/29/2020 CLINICAL DATA:  Shortness of breath and CHF. EXAM: PORTABLE CHEST 1 VIEW COMPARISON:  11/26/2020 FINDINGS: 0816 hours. The cardio pericardial silhouette is enlarged. Vascular congestion with diffuse interstitial opacity is progressive in the interval. There is bibasilar atelectasis/infiltrate with small bilateral pleural effusions. Telemetry leads overlie the chest. IMPRESSION: Cardiomegaly with worsening pulmonary edema pattern. Basilar atelectasis/infiltrate with small bilateral pleural effusions. Electronically Signed   By: Misty Stanley M.D.   On: 11/29/2020 08:32   ECHOCARDIOGRAM COMPLETE  Result Date: 12/24/2020    ECHOCARDIOGRAM REPORT   Patient Name:   Andres Gutierrez Date of Exam: 12/24/2020 Medical Rec #:  [id number]    Height:       76.0 in Accession #:    [id number]   Weight:       169.3 lb Date of Birth:  24-Dec-1930    BSA:          2.064 m Patient Age:    30 years     BP:           97/53 mmHg Patient Gender: M            HR:           85 bpm. Exam Location:  [facility] Procedure: 2D Echo and  Intracardiac Opacification Agent Indications:     Systolic CHF  History:         Patient has prior history of Echocardiogram examinations. CHF                  and Cardiomegaly, Previous Myocardial Infarction, Prior CABG;  Risk Factors:Hypertension.  Sonographer:     L Thornton-Maynard Referring Phys:  [id number] ERIC CHEN Diagnosing Phys: Yolonda Kida MD IMPRESSIONS  1. Left ventricular ejection fraction, by estimation, is <20%. The left ventricle has severely decreased function. The left ventricle demonstrates global hypokinesis. The left ventricular internal cavity size was severely dilated. Left ventricular diastolic parameters were normal.  2. Right ventricular systolic function is moderately reduced. The right ventricular size is moderately enlarged. There is normal pulmonary artery systolic pressure.  3. Left atrial size was moderately dilated.  4. Right atrial size was moderately dilated.  5. The mitral valve is grossly normal. Mild mitral valve regurgitation.  6. The aortic valve is calcified. Aortic valve regurgitation is mild. Moderate to severe aortic valve stenosis. FINDINGS  Left Ventricle: Left ventricular ejection fraction, by estimation, is <20%. The left ventricle has severely decreased function. The left ventricle demonstrates global hypokinesis. Definity contrast agent was given IV to delineate the left ventricular endocardial borders. The left ventricular internal cavity size was severely dilated. There is no left ventricular hypertrophy. Abnormal (paradoxical) septal motion, consistent with left bundle branch block. Left ventricular diastolic parameters were normal. Right Ventricle: The right ventricular size is moderately enlarged. No increase in right ventricular wall thickness. Right ventricular systolic function is moderately reduced. There is normal pulmonary artery systolic pressure. The tricuspid regurgitant velocity is 2.61 m/s, and with an assumed right atrial pressure  of 3 mmHg, the estimated right ventricular systolic pressure is 83.3 mmHg. Left Atrium: Left atrial size was moderately dilated. Right Atrium: Right atrial size was moderately dilated. Pericardium: Trivial pericardial effusion is present. Mitral Valve: The mitral valve is grossly normal. Mild mitral valve regurgitation. MV peak gradient, 9.9 mmHg. The mean mitral valve gradient is 5.0 mmHg. Tricuspid Valve: The tricuspid valve is grossly normal. Tricuspid valve regurgitation is mild. Aortic Valve: The aortic valve is calcified. Aortic valve regurgitation is mild. Moderate to severe aortic stenosis is present. Aortic valve mean gradient measures 11.5 mmHg. Aortic valve peak gradient measures 18.7 mmHg. Aortic valve area, by VTI measures 1.36 cm. Pulmonic Valve: The pulmonic valve was grossly normal. Pulmonic valve regurgitation is not visualized. Aorta: The ascending aorta was not well visualized. IAS/Shunts: No atrial level shunt detected by color flow Doppler. Additional Comments: There is pleural effusion in both left and right lateral regions.  LEFT VENTRICLE PLAX 2D LVIDd:         6.92 cm  Diastology LVIDs:         5.92 cm  LV e' medial:    5.51 cm/s LV PW:         1.08 cm  LV E/e' medial:  22.5 LV IVS:        0.91 cm  LV e' lateral:   7.14 cm/s LVOT diam:     2.20 cm  LV E/e' lateral: 17.4 LV SV:         52 LV SV Index:   25 LVOT Area:     3.80 cm  RIGHT VENTRICLE RV S prime:     7.70 cm/s TAPSE (M-mode): 1.1 cm LEFT ATRIUM              Index LA diam:        5.10 cm  2.47 cm/m LA Vol (A2C):   126.0 ml 61.05 ml/m LA Vol (A4C):   108.0 ml 52.33 ml/m LA Biplane Vol: 120.0 ml 58.15 ml/m  AORTIC VALVE  PULMONIC VALVE AV Area (Vmax):    1.28 cm     PV Vmax:       1.10 m/s AV Area (Vmean):   1.36 cm     PV Peak grad:  4.8 mmHg AV Area (VTI):     1.36 cm AV Vmax:           216.50 cm/s AV Vmean:          161.000 cm/s AV VTI:            0.386 m AV Peak Grad:      18.7 mmHg AV Mean Grad:       11.5 mmHg LVOT Vmax:         73.10 cm/s LVOT Vmean:        57.700 cm/s LVOT VTI:          0.138 m LVOT/AV VTI ratio: 0.36  AORTA Ao Root diam: 3.30 cm Ao Asc diam:  4.20 cm MITRAL VALVE                TRICUSPID VALVE MV Area (PHT): 4.31 cm     TR Peak grad:   27.2 mmHg MV Area VTI:   1.44 cm     TR Vmax:        261.00 cm/s MV Peak grad:  9.9 mmHg MV Mean grad:  5.0 mmHg     SHUNTS MV Vmax:       1.57 m/s     Systemic VTI:  0.14 m MV Vmean:      108.0 cm/s   Systemic Diam: 2.20 cm MV Decel Time: 176 msec MV E velocity: 124.00 cm/s Yolonda Kida MD Electronically signed by Yolonda Kida MD Signature Date/Time: 12/24/2020/9:26:05 PM    Final    CT RENAL STONE STUDY  Result Date: 12/05/2020 CLINICAL DATA:  Flank pain, kidney stone suspected EXAM: CT ABDOMEN AND PELVIS WITHOUT CONTRAST TECHNIQUE: Multidetector CT imaging of the abdomen and pelvis was performed following the standard protocol without IV contrast. COMPARISON:  10/03/2020 FINDINGS: Lower chest: There is a moderate to large right pleural effusion and small left pleural effusion. Pleural thickening and calcifications overlie the posterior medial right lung. Hepatobiliary: No focal liver abnormality. Status post cholecystectomy. No bile duct dilatation. Pancreas: No pancreas inflammation, mass or main duct dilatation. Spleen: Normal appearance of the spleen. Adrenals/urinary tract: Normal appearance of the adrenal glands. 3 mm stone identified within the upper pole of the left kidney. 2 mm calcification noted within upper pole of right kidney. Bilateral kidney cysts are again noted and are incompletely characterized without IV contrast. The largest is in the lateral cortex of the upper pole of left kidney measuring 2.8 centimeters, image 98/5. No hydronephrosis identified bilaterally. No hydroureter or ureterolithiasis. Urinary bladder appears normal. Stomach/bowel: Stomach is normal. Status post right hemicolectomy with enterocolonic anastomosis.  No bowel wall thickening, inflammation, or distension. Vascular/lymphatic: Aortic atherosclerosis. No aneurysm. Calcified lymph nodes identified within the upper abdomen compatible with prior granulomatous disease. No abdominopelvic adenopathy. Reproductive: Normal. Other: Small volume of free fluid noted within the abdomen and pelvis, increased from previous exam. No discrete fluid collections. No abdominal wall hernia. Musculoskeletal: 7 millimeters anterolisthesis L4 on L5. Multilevel degenerative disc disease is identified throughout the thoracic and lumbar spine. L4-5 and L5-S1 facet arthropathy. IMPRESSION: 1. Nonobstructing small bilateral renal calculi as described above. 2. Cardiac enlargement and bilateral pleural effusions concerning for congestive heart failure. 3. Small volume of free fluid within the abdomen and  pelvis, increased from previous exam. 4. Marked thoracolumbar spondylosis. Electronically Signed   By: Kerby Moors M.D.   On: 12/05/2020 06:18   US THORACENTESIS ASP PLEURAL SPACE W/IMG GUIDE  Result Date: 12/05/2020 INDICATION: Patient history of congestive heart failure, AFib, myelodysplastic syndrome currently admitted for acute exacerbation congestive heart failure and found to have bilateral pleural effusions. Request to IR for diagnostic and therapeutic thoracentesis. EXAM: ULTRASOUND GUIDED RIGHT THORACENTESIS MEDICATIONS: 7 mL% lidocaine COMPLICATIONS: None immediate. PROCEDURE: An ultrasound guided thoracentesis was thoroughly discussed with the patient and questions answered. The benefits, risks, alternatives and complications were also discussed. The patient understands and wishes to proceed with the procedure. Written consent was obtained. Ultrasound was performed to localize and mark an adequate pocket of fluid in the right chest. The area was then prepped and draped in the normal sterile fashion. 1% Lidocaine was used for local anesthesia. Under ultrasound guidance a 6 Fr  Safe-T-Centesis catheter was introduced. Thoracentesis was performed. The catheter was removed and a dressing applied. FINDINGS: A total of approximately 2.0 L of clear yellow fluid was removed. Samples were sent to the laboratory as requested by the clinical team. IMPRESSION: Successful ultrasound guided right thoracentesis yielding 2.0 L of pleural fluid. Read by Candiss Norse, PA-C Electronically Signed   By: Markus Daft M.D.   On: 12/05/2020 11:59    EKG: Normal sinus rhythm rate around 72 bundle branch block  ASSESSMENT AND PLAN:  Acute on chronic congestive heart failure Ischemic cardiomyopathy EF of 25% Chronic right pleural effusion Chronic renal sufficiency stage III Chronic atrial fibrillation History of GI bleed Generalized weakness and fatigue . Plan Continue heart failure therapy Lasix 80 mg every 12 IV for diuresis Recommend thoracentesis for right pleural effusion Generalized hypotension consider Entresto for heart failure management Chronic atrial fibrillation continue digoxin a hold Coreg because of mild hypotension Continue midodrine as necessary for hypotension Continue angina therapy with Ranexa Continue Protonix therapy for reflux type symptoms as well as Carafate Maintain Lipitor therapy for hyperlipidemia    Signed: Yolonda Kida MD 12/25/2020, 11:26 AM

## 2020-12-25 NOTE — Progress Notes (Signed)
PROGRESS NOTE    Andres Gutierrez  ZOX:[id number] DOB: [birth year] DOA: 12/23/2020 PCP: Rusty Aus, MD   Assessment & Plan:   Principal Problem:   Acute on chronic combined systolic (congestive) and diastolic (congestive) heart failure (HCC) Active Problems:   Chronic atrial fibrillation (HCC)   BPH (benign prostatic hyperplasia)   CKD (chronic kidney disease), stage IIIb   MDS (myelodysplastic syndrome), low grade (HCC)   Recurrent right pleural effusion   DNR (do not resuscitate)/DNI(Do Not Intubate)   Acute combined systolic (congestive) and diastolic (congestive) heart failure (HCC)   CHF (congestive heart failure) (HCC)  Acute on chronic combined CHF exacerbation: continue on IV lasix. Monitor I/Os and daily weights. Echo shows EF < 81%, normal diastolic function. Cardio consulted   MDS: chronic. S/p pRBC transfusion 3 days ago. Likely etiology on anemia & thrombocytopenia. B12, folate are WNL   Recurrent right pleural effusion: small as per CXR. Will repeat CXR tomorrow AM   CKDIIIb: Cr is labile. Avoid nephrotoxic meds   Chronic a. fib: continue on digoxin, coreg. Not on anticoagulation secondary to hx of GI bleeding  BPH: continue on home dose of tamsulosin  Hypomagnesemia: WNL   DVT prophylaxis: SCDs Code Status: DNR Family Communication: discussed pt's care w/ pt's family and answered their questions  Disposition Plan: depends on PT/OT recs (not consulted yet)  Level of care: Med-Surg  Status is: Inpatient  Remains inpatient appropriate because:Unsafe d/c plan, IV treatments appropriate due to intensity of illness or inability to take PO, and Inpatient level of care appropriate due to severity of illness  Dispo: The patient is from: Home              Anticipated d/c is to: Home              Patient currently is not medically stable to d/c.   Difficult to place patient unclear   Consultants:  cardio  Procedures:  Antimicrobials:   Subjective: Pt c/o  fatigue   Objective: Vitals:   12/25/20 0500 12/25/20 0600 12/25/20 0836 12/25/20 1151  BP:   109/64 (!) 89/41  Pulse:   83 68  Resp:    18  Temp:    (!) 97.5 F (36.4 C)  TempSrc:    Oral  SpO2:   96% 92%  Weight: 75.1 kg 75.1 kg    Height:        Intake/Output Summary (Last 24 hours) at 12/25/2020 1401 Last data filed at 12/25/2020 1330 Gross per 24 hour  Intake 1560 ml  Output 1750 ml  Net -190 ml   Filed Weights   12/23/20 1815 12/25/20 0500 12/25/20 0600  Weight: 76.8 kg 75.1 kg 75.1 kg    Examination:  General exam: Appears comfortable. Frail appearing    Respiratory system: decreased breath sounds b/l  Cardiovascular system: S1/S2+. No rubs or clicks  Gastrointestinal system: Abd is soft, NT, ND & normal bowel sounds  Central nervous system: Alert and oriented. Moves all extremities  Psychiatry: Judgement and insight appear abnormal. Flat mood and affect     Data Reviewed: I have personally reviewed following labs and imaging studies  CBC: Recent Labs  Lab 12/20/20 0946 12/23/20 0925 12/24/20 0411 12/25/20 0529  WBC 6.2 6.2 7.3 5.9  NEUTROABS 2.3 2.3 3.0  --   HGB 7.6* 9.1* 8.7* 9.2*  HCT 23.5* 28.1* 26.2* 28.4*  MCV 100.4* 101.4* 100.4* 100.0  PLT 138* 154 143* 191*   Basic Metabolic Panel: Recent Labs  Lab 12/23/20 0925 12/24/20 0411 12/25/20 0529  NA 136 136 136  K 4.5 4.3 4.2  CL 100 98 98  CO2 26 26 27   GLUCOSE 105* 91 104*  BUN 43* 44* 45*  CREATININE 2.36* 2.40* 2.36*  CALCIUM 9.2 9.1 9.3  MG  --  1.6* 1.9   GFR: Estimated Creatinine Clearance: 23.4 mL/min (A) (by C-G formula based on SCr of 2.36 mg/dL (H)). Liver Function Tests: Recent Labs  Lab 12/23/20 0925 12/24/20 0411  AST 24 21  ALT 14 14  ALKPHOS 68 68  BILITOT 1.5* 1.4*  PROT 7.2 6.7  ALBUMIN 4.3 3.9   Recent Labs  Lab 12/23/20 0925  LIPASE 46   No results for input(s): AMMONIA in the last 168 hours. Coagulation Profile: No results for input(s): INR,  PROTIME in the last 168 hours. Cardiac Enzymes: No results for input(s): CKTOTAL, CKMB, CKMBINDEX, TROPONINI in the last 168 hours. BNP (last 3 results) No results for input(s): PROBNP in the last 8760 hours. HbA1C: No results for input(s): HGBA1C in the last 72 hours. CBG: No results for input(s): GLUCAP in the last 168 hours. Lipid Profile: No results for input(s): CHOL, HDL, LDLCALC, TRIG, CHOLHDL, LDLDIRECT in the last 72 hours. Thyroid Function Tests: No results for input(s): TSH, T4TOTAL, FREET4, T3FREE, THYROIDAB in the last 72 hours. Anemia Panel: Recent Labs    12/25/20 0529  VITAMINB12 1,875*  FOLATE 30.0   Sepsis Labs: No results for input(s): PROCALCITON, LATICACIDVEN in the last 168 hours.  Recent Results (from the past 240 hour(s))  Resp Panel by RT-PCR (Flu A&B, Covid) Nasopharyngeal Swab     Status: None   Collection Time: 12/23/20 11:02 AM   Specimen: Nasopharyngeal Swab; Nasopharyngeal(NP) swabs in vial transport medium  Result Value Ref Range Status   SARS Coronavirus 2 by RT PCR NEGATIVE NEGATIVE Final    Comment: (NOTE) SARS-CoV-2 target nucleic acids are NOT DETECTED.  The SARS-CoV-2 RNA is generally detectable in upper respiratory specimens during the acute phase of infection. The lowest concentration of SARS-CoV-2 viral copies this assay can detect is 138 copies/mL. A negative result does not preclude SARS-Cov-2 infection and should not be used as the sole basis for treatment or other patient management decisions. A negative result may occur with  improper specimen collection/handling, submission of specimen other than nasopharyngeal swab, presence of viral mutation(s) within the areas targeted by this assay, and inadequate number of viral copies(<138 copies/mL). A negative result must be combined with clinical observations, patient history, and epidemiological information. The expected result is Negative.  Fact Sheet for Patients:   EntrepreneurPulse.com.au  Fact Sheet for Healthcare Providers:  IncredibleEmployment.be  This test is no t yet approved or cleared by the [place] FDA and  has been authorized for detection and/or diagnosis of SARS-CoV-2 by FDA under an Emergency Use Authorization (EUA). This EUA will remain  in effect (meaning this test can be used) for the duration of the COVID-19 declaration under Section 564(b)(1) of the Act, 21 U.S.C.section 360bbb-3(b)(1), unless the authorization is terminated  or revoked sooner.       Influenza A by PCR NEGATIVE NEGATIVE Final   Influenza B by PCR NEGATIVE NEGATIVE Final    Comment: (NOTE) The Xpert Xpress SARS-CoV-2/FLU/RSV plus assay is intended as an aid in the diagnosis of influenza from Nasopharyngeal swab specimens and should not be used as a sole basis for treatment. Nasal washings and aspirates are unacceptable for Xpert Xpress SARS-CoV-2/FLU/RSV testing.  Fact Sheet  for Patients: EntrepreneurPulse.com.au  Fact Sheet for Healthcare Providers: IncredibleEmployment.be  This test is not yet approved or cleared by the [place] FDA and has been authorized for detection and/or diagnosis of SARS-CoV-2 by FDA under an Emergency Use Authorization (EUA). This EUA will remain in effect (meaning this test can be used) for the duration of the COVID-19 declaration under Section 564(b)(1) of the Act, 21 U.S.C. section 360bbb-3(b)(1), unless the authorization is terminated or revoked.  Performed at [facility], [address], [place], [place] [address]          Radiology Studies: ECHOCARDIOGRAM COMPLETE  Result Date: 12/24/2020    ECHOCARDIOGRAM REPORT   Patient Name:   Andres Gutierrez Date of Exam: 12/24/2020 Medical Rec #:  [id number]    Height:       76.0 in Accession #:    [id number]   Weight:       169.3 lb Date of Birth:  04-05-1931    BSA:          2.064 m  Patient Age:    4 years     BP:           97/53 mmHg Patient Gender: M            HR:           85 bpm. Exam Location:  [facility] Procedure: 2D Echo and Intracardiac Opacification Agent Indications:     Systolic CHF  History:         Patient has prior history of Echocardiogram examinations. CHF                  and Cardiomegaly, Previous Myocardial Infarction, Prior CABG;                  Risk Factors:Hypertension.  Sonographer:     L Thornton-Maynard Referring Phys:  [id number] ERIC CHEN Diagnosing Phys: Yolonda Kida MD IMPRESSIONS  1. Left ventricular ejection fraction, by estimation, is <20%. The left ventricle has severely decreased function. The left ventricle demonstrates global hypokinesis. The left ventricular internal cavity size was severely dilated. Left ventricular diastolic parameters were normal.  2. Right ventricular systolic function is moderately reduced. The right ventricular size is moderately enlarged. There is normal pulmonary artery systolic pressure.  3. Left atrial size was moderately dilated.  4. Right atrial size was moderately dilated.  5. The mitral valve is grossly normal. Mild mitral valve regurgitation.  6. The aortic valve is calcified. Aortic valve regurgitation is mild. Moderate to severe aortic valve stenosis. FINDINGS  Left Ventricle: Left ventricular ejection fraction, by estimation, is <20%. The left ventricle has severely decreased function. The left ventricle demonstrates global hypokinesis. Definity contrast agent was given IV to delineate the left ventricular endocardial borders. The left ventricular internal cavity size was severely dilated. There is no left ventricular hypertrophy. Abnormal (paradoxical) septal motion, consistent with left bundle branch block. Left ventricular diastolic parameters were normal. Right Ventricle: The right ventricular size is moderately enlarged. No increase in right ventricular wall thickness. Right ventricular systolic function is moderately  reduced. There is normal pulmonary artery systolic pressure. The tricuspid regurgitant velocity is 2.61 m/s, and with an assumed right atrial pressure of 3 mmHg, the estimated right ventricular systolic pressure is 46.6 mmHg. Left Atrium: Left atrial size was moderately dilated. Right Atrium: Right atrial size was moderately dilated. Pericardium: Trivial pericardial effusion is present. Mitral Valve: The mitral valve is grossly normal. Mild mitral valve regurgitation. MV  peak gradient, 9.9 mmHg. The mean mitral valve gradient is 5.0 mmHg. Tricuspid Valve: The tricuspid valve is grossly normal. Tricuspid valve regurgitation is mild. Aortic Valve: The aortic valve is calcified. Aortic valve regurgitation is mild. Moderate to severe aortic stenosis is present. Aortic valve mean gradient measures 11.5 mmHg. Aortic valve peak gradient measures 18.7 mmHg. Aortic valve area, by VTI measures 1.36 cm. Pulmonic Valve: The pulmonic valve was grossly normal. Pulmonic valve regurgitation is not visualized. Aorta: The ascending aorta was not well visualized. IAS/Shunts: No atrial level shunt detected by color flow Doppler. Additional Comments: There is pleural effusion in both left and right lateral regions.  LEFT VENTRICLE PLAX 2D LVIDd:         6.92 cm  Diastology LVIDs:         5.92 cm  LV e' medial:    5.51 cm/s LV PW:         1.08 cm  LV E/e' medial:  22.5 LV IVS:        0.91 cm  LV e' lateral:   7.14 cm/s LVOT diam:     2.20 cm  LV E/e' lateral: 17.4 LV SV:         52 LV SV Index:   25 LVOT Area:     3.80 cm  RIGHT VENTRICLE RV S prime:     7.70 cm/s TAPSE (M-mode): 1.1 cm LEFT ATRIUM              Index LA diam:        5.10 cm  2.47 cm/m LA Vol (A2C):   126.0 ml 61.05 ml/m LA Vol (A4C):   108.0 ml 52.33 ml/m LA Biplane Vol: 120.0 ml 58.15 ml/m  AORTIC VALVE                    PULMONIC VALVE AV Area (Vmax):    1.28 cm     PV Vmax:       1.10 m/s AV Area (Vmean):   1.36 cm     PV Peak grad:  4.8 mmHg AV Area (VTI):      1.36 cm AV Vmax:           216.50 cm/s AV Vmean:          161.000 cm/s AV VTI:            0.386 m AV Peak Grad:      18.7 mmHg AV Mean Grad:      11.5 mmHg LVOT Vmax:         73.10 cm/s LVOT Vmean:        57.700 cm/s LVOT VTI:          0.138 m LVOT/AV VTI ratio: 0.36  AORTA Ao Root diam: 3.30 cm Ao Asc diam:  4.20 cm MITRAL VALVE                TRICUSPID VALVE MV Area (PHT): 4.31 cm     TR Peak grad:   27.2 mmHg MV Area VTI:   1.44 cm     TR Vmax:        261.00 cm/s MV Peak grad:  9.9 mmHg MV Mean grad:  5.0 mmHg     SHUNTS MV Vmax:       1.57 m/s     Systemic VTI:  0.14 m MV Vmean:      108.0 cm/s   Systemic Diam: 2.20 cm MV Decel Time: 176 msec MV E velocity: 124.00  cm/s Yolonda Kida MD Electronically signed by Yolonda Kida MD Signature Date/Time: 12/24/2020/9:26:05 PM    Final         Scheduled Meds:  atorvastatin  40 mg Oral Q supper   carvedilol  3.125 mg Oral BID WC   digoxin  0.0625 mg Oral Daily   famotidine  10 mg Oral QODAY   furosemide  40 mg Intravenous BID   midodrine  10 mg Oral TID WC   pantoprazole  40 mg Oral BID   ranolazine  500 mg Oral BID   sucralfate  1 g Oral BID   tamsulosin  0.4 mg Oral QPC supper   Continuous Infusions:   LOS: 1 day    Time spent: 30 mins     Wyvonnia Dusky, MD [facility] Pager [phone]  If 7PM-7AM, please contact night-coverage 12/25/2020, 2:01 PM

## 2020-12-26 ENCOUNTER — Inpatient Hospital Stay: Payer: PPO

## 2020-12-26 LAB — BASIC METABOLIC PANEL
Anion gap: 11 (ref 5–15)
BUN: 44 mg/dL — ABNORMAL HIGH (ref 8–23)
CO2: 27 mmol/L (ref 22–32)
Calcium: 9 mg/dL (ref 8.9–10.3)
Chloride: 97 mmol/L — ABNORMAL LOW (ref 98–111)
Creatinine, Ser: 2.19 mg/dL — ABNORMAL HIGH (ref 0.61–1.24)
GFR, Estimated: 28 mL/min — ABNORMAL LOW (ref >60)
Glucose, Bld: 88 mg/dL (ref 70–99)
Potassium: 3.9 mmol/L (ref 3.5–5.1)
Sodium: 135 mmol/L (ref 135–145)

## 2020-12-26 LAB — CBC
HCT: 25.9 % — ABNORMAL LOW (ref 39.0–52.0)
Hemoglobin: 8.9 g/dL — ABNORMAL LOW (ref 13.0–17.0)
MCH: 34.1 pg — ABNORMAL HIGH (ref 26.0–34.0)
MCHC: 34.4 g/dL (ref 30.0–36.0)
MCV: 99.2 fL (ref 80.0–100.0)
Platelets: 157 10*3/uL (ref 150–400)
RBC: 2.61 MIL/uL — ABNORMAL LOW (ref 4.22–5.81)
RDW: 18 % — ABNORMAL HIGH (ref 11.5–15.5)
WBC: 6.1 10*3/uL (ref 4.0–10.5)
nRBC: 0 % (ref 0.0–0.2)

## 2020-12-26 LAB — MAGNESIUM: Magnesium: 1.8 mg/dL (ref 1.7–2.4)

## 2020-12-26 MED ORDER — ENSURE ENLIVE PO LIQD
237.0000 mL | Freq: Three times a day (TID) | ORAL | Status: DC
  Administered 2020-12-26 – 2021-01-01 (×18): 237 mL via ORAL

## 2020-12-26 MED ORDER — ADULT MULTIVITAMIN W/MINERALS CH
1.0000 | ORAL_TABLET | Freq: Every day | ORAL | Status: DC
  Administered 2020-12-27 – 2021-01-01 (×6): 1 via ORAL
  Filled 2020-12-26 (×6): qty 1

## 2020-12-26 NOTE — Evaluation (Signed)
Occupational Therapy Evaluation Patient Details Name: Andres Gutierrez MRN: [id number] DOB: 26-Nov-1930 Today's Date: 12/26/2020    History of Present Illness 87 year old white male with a history of combined systolic diastolic heart failure with an EF of 25%, hypertension, CKD stage IIIb, myelodysplastic syndrome, recurrent right pleural effusion, chronic atrial fibrillation not on anticoagulation due to history of prior GI bleed, BPH who presents to the ER today with worsening shortness of breath.  Daughter states the patient has been short of breath for last several weeks.  Patient recently discharged at the end of July for about of heart failure.  Patient recently had a packed red blood cell transfusion in hematology clinic due to a low hemoglobin.   Clinical Impression   Andres Gutierrez was seen for OT evaluation this date. Prior to hospital admission, pt was MOD I for mobility and ADLs using RW. Pt lives alone in 1 level home c 2 STE and B rails, daughter available PRN. Pt presents to acute OT demonstrating impaired ADL performance and functional mobility 2/2 decreased activity tolerance. Pt currently requires SBA + RW for ADL t/f. MOD I don B socks seated EOB. SBA standing intermittent UE support, no LOBs. Pt would benefit from skilled OT to address noted impairments and functional limitations to maximize safety and independence. Upon hospital discharge, recommend HHOT to maximize pt safety and return to functional independence during meaningful occupations of daily life.  Seated: BP 99/56, MAP 69, HR 72, SpO2 95% on 1L [place] Standing: BP 82/51, MAP 61, HR 76, SpO2 77% on 1L [place], increased 2L [place] and desat 82%, resolved with seated rest. RN notified     Follow Up Recommendations  Home health OT;Supervision - Intermittent    Equipment Recommendations  None recommended by OT    Recommendations for Other Services       Precautions / Restrictions Precautions Precautions: None Precaution Comments: pt  with LLE surgical shoe (per chart podiatrist wants him to wear that shoe, he visits podiatrists every 2-4 weeks for tx of calluses) Restrictions Weight Bearing Restrictions: No      Mobility Bed Mobility   General bed mobility comments: pt received and left in chair    Transfers Overall transfer level: Needs assistance Equipment used: Rolling walker (2 wheeled) Transfers: Sit to/from Stand Sit to Stand: Supervision         General transfer comment: good safety awareness    Balance Overall balance assessment: Needs assistance Sitting-balance support: No upper extremity supported;Feet supported Sitting balance-[name]: Good     Standing balance support: Single extremity supported;During functional activity Standing balance-[name]: Fair                             ADL either performed or assessed with clinical judgement   ADL Overall ADL's : Needs assistance/impaired                                       General ADL Comments: SBA + RW for ADL t/f. MOD I don B socks seated EOB. SBA standing intermittent UE support, no LOBs      Pertinent Vitals/Pain Pain Assessment: No/denies pain     Hand Dominance Right   Extremity/Trunk Assessment Upper Extremity Assessment Upper Extremity Assessment: Overall WFL for tasks assessed   Lower Extremity Assessment Lower Extremity Assessment: Overall WFL for tasks assessed  Cervical / Trunk Assessment Cervical / Trunk Assessment: Kyphotic   Communication Communication Communication: HOH   Cognition Arousal/Alertness: Awake/alert Behavior During Therapy: WFL for tasks assessed/performed Overall Cognitive Status: Within Functional Limits for tasks assessed                                     General Comments  Seated: BP 99/56, MAP 69, HR 72, SpO2 95% on 1L [place]. Standing: BP 82/51, MAP 61, HR 76, SpO2 77% on 1L [place], increased 2L [place] and desat 82%, resolved with seated rest. RN  notified    Exercises Exercises: Other exercises Other Exercises Other Exercises: Pt educated re: OT role, DME recs, d/c recs, ECS Other Exercises: LBD, sit<>stand, sitting/standing balance/tolerance, ~30 ft mobility   Shoulder Instructions      Home Living Family/patient expects to be discharged to:: Private residence Living Arrangements: Alone Available Help at Discharge: Family;Available PRN/intermittently Type of Home: House Home Access: Stairs to enter [employer] of Steps: 2 Entrance Stairs-Rails: Right;Left;Can reach both Home Layout: One level     Bathroom Shower/Tub: Occupational psychologist: Standard Bathroom Accessibility: Yes   Home Equipment: Environmental consultant - 2 wheels;Cane - single point;Bedside commode;Shower seat;Grab bars - tub/shower          Prior Functioning/Environment Level of Independence: Independent with assistive device(s)        Comments: Mod I with RW, doesn't drive (children assist with that), bathes, dresses, no falls in past 6 months.Daughter completes grocery shopping.        OT Problem List: Decreased strength;Decreased activity tolerance;Decreased safety awareness;Impaired balance (sitting and/or standing);Decreased knowledge of use of DME or AE      OT Treatment/Interventions: Self-care/ADL training;Balance training;Therapeutic exercise;Therapeutic activities;Energy conservation;DME and/or AE instruction;Patient/family education;Manual therapy    OT Goals(Current goals can be found in the care plan section) Acute Rehab OT Goals Patient Stated Goal: go home OT Goal Formulation: With patient Time For Goal Achievement: 01/09/21 Potential to Achieve Goals: Good ADL Goals Pt Will Perform Grooming: Independently;standing Pt Will Transfer to Toilet: with modified independence;ambulating;regular height toilet (c LRAD PRN) Additional ADL Goal #1: Pt will Independetly verbalize plan to implement x3 ECS  OT Frequency: Min  1X/week    AM-PAC OT "6 Clicks" Daily Activity     Outcome Measure Help from another person eating meals?: None Help from another person taking care of personal grooming?: None Help from another person toileting, which includes using toliet, bedpan, or urinal?: A Little Help from another person bathing (including washing, rinsing, drying)?: A Little Help from another person to put on and taking off regular upper body clothing?: None Help from another person to put on and taking off regular lower body clothing?: None 6 Click Score: 22   End of Session Equipment Utilized During Treatment: Rolling walker Nurse Communication: Mobility status  Activity Tolerance: Patient tolerated treatment well Patient left: in chair;with call bell/phone within reach  OT Visit Diagnosis: Unsteadiness on feet (R26.81);Muscle weakness (generalized) (M62.81)                Time: 1121-1140 OT Time Calculation (min): 19 min Charges:  OT General Charges $OT Visit: 1 Visit OT Evaluation $OT Eval Low Complexity: 1 Low OT Treatments $Self Care/Home Management : 8-22 mins  Dessie Coma, M.S. OTR/L  12/26/20, 1:47 PM  ascom [phone]

## 2020-12-26 NOTE — Progress Notes (Signed)
PROGRESS NOTE    Andres Gutierrez  WIO:[id number] DOB: 1930-11-14 DOA: 12/23/2020 PCP: Rusty Aus, MD   Assessment & Plan:   Principal Problem:   Acute on chronic combined systolic (congestive) and diastolic (congestive) heart failure (HCC) Active Problems:   Chronic atrial fibrillation (HCC)   BPH (benign prostatic hyperplasia)   CKD (chronic kidney disease), stage IIIb   MDS (myelodysplastic syndrome), low grade (HCC)   Recurrent right pleural effusion   DNR (do not resuscitate)/DNI(Do Not Intubate)   Acute combined systolic (congestive) and diastolic (congestive) heart failure (HCC)   CHF (congestive heart failure) (HCC)  Acute on chronic combined CHF exacerbation: continue on coreg, digoxin, IV lasix. Monitor I/Os and daily weights. Echo shows EF < 42%, normal diastolic function.   MDS: chronic. S/p pRBC transfusion 3 days ago. Likely etiology on anemia & thrombocytopenia. B12, folate are WNL   Recurrent right pleural effusion: repeat CXR still shows small pleural effusion unlikely amenable at this time to thoracentesis. Continue on IV lasix   CKDIIIb: Cr is trending down from day prior. Avoid nephrotoxic meds   Chronic a. fib: continue on digoxin, coreg. Not on anticoagulation secondary to hx of GI bleeding  BPH: continue on home dose of tamsulosin   Hypomagnesemia: WNL   DVT prophylaxis: SCDs Code Status: DNR Family Communication:  Disposition Plan: PT/OT recs HH   Level of care: Med-Surg  Status is: Inpatient  Remains inpatient appropriate because:Unsafe d/c plan, IV treatments appropriate due to intensity of illness or inability to take PO, and Inpatient level of care appropriate due to severity of illness  Dispo: The patient is from: Home              Anticipated d/c is to: Home              Patient currently is not medically stable to d/c.   Difficult to place patient unclear   Consultants:  cardio  Procedures:  Antimicrobials:   Subjective: Pt  c/o shortness of breath   Objective: Vitals:   12/26/20 0529 12/26/20 0858 12/26/20 1226 12/26/20 1245  BP:  (!) 94/45 (!) 101/59   Pulse:  71 79   Resp:  18 18   Temp:  97.6 F (36.4 C)  (!) 96.3 F (35.7 C)  TempSrc:    Axillary  SpO2:  94% 100%   Weight: 78.5 kg     Height:        Intake/Output Summary (Last 24 hours) at 12/26/2020 1425 Last data filed at 12/26/2020 1015 Gross per 24 hour  Intake 840 ml  Output 900 ml  Net -60 ml   Filed Weights   12/25/20 0500 12/25/20 0600 12/26/20 0529  Weight: 75.1 kg 75.1 kg 78.5 kg    Examination:  General exam: Appears calm & comfortable   Respiratory system: diminished breath sounds b/l. No rhonchi  Cardiovascular system: S1 & S2+. No rubs or clicks   Gastrointestinal system: Abd is soft, NT, ND & hypoactive bowel sounds  Central nervous system: Alert and oriented. Moves all extremities  Psychiatry: Judgement and insight appear poor. Flat mood and affect    Data Reviewed: I have personally reviewed following labs and imaging studies  CBC: Recent Labs  Lab 12/20/20 0946 12/23/20 0925 12/24/20 0411 12/25/20 0529 12/26/20 0436  WBC 6.2 6.2 7.3 5.9 6.1  NEUTROABS 2.3 2.3 3.0  --   --   HGB 7.6* 9.1* 8.7* 9.2* 8.9*  HCT 23.5* 28.1* 26.2* 28.4* 25.9*  MCV  100.4* 101.4* 100.4* 100.0 99.2  PLT 138* 154 143* 144* 235   Basic Metabolic Panel: Recent Labs  Lab 12/23/20 0925 12/24/20 0411 12/25/20 0529 12/26/20 0436  NA 136 136 136 135  K 4.5 4.3 4.2 3.9  CL 100 98 98 97*  CO2 26 26 27 27   GLUCOSE 105* 91 104* 88  BUN 43* 44* 45* 44*  CREATININE 2.36* 2.40* 2.36* 2.19*  CALCIUM 9.2 9.1 9.3 9.0  MG  --  1.6* 1.9 1.8   GFR: Estimated Creatinine Clearance: 26.4 mL/min (A) (by C-G formula based on SCr of 2.19 mg/dL (H)). Liver Function Tests: Recent Labs  Lab 12/23/20 0925 12/24/20 0411  AST 24 21  ALT 14 14  ALKPHOS 68 68  BILITOT 1.5* 1.4*  PROT 7.2 6.7  ALBUMIN 4.3 3.9   Recent Labs  Lab  12/23/20 0925  LIPASE 46   No results for input(s): AMMONIA in the last 168 hours. Coagulation Profile: No results for input(s): INR, PROTIME in the last 168 hours. Cardiac Enzymes: No results for input(s): CKTOTAL, CKMB, CKMBINDEX, TROPONINI in the last 168 hours. BNP (last 3 results) No results for input(s): PROBNP in the last 8760 hours. HbA1C: No results for input(s): HGBA1C in the last 72 hours. CBG: No results for input(s): GLUCAP in the last 168 hours. Lipid Profile: No results for input(s): CHOL, HDL, LDLCALC, TRIG, CHOLHDL, LDLDIRECT in the last 72 hours. Thyroid Function Tests: No results for input(s): TSH, T4TOTAL, FREET4, T3FREE, THYROIDAB in the last 72 hours. Anemia Panel: Recent Labs    12/25/20 0529  VITAMINB12 1,875*  FOLATE 30.0   Sepsis Labs: No results for input(s): PROCALCITON, LATICACIDVEN in the last 168 hours.  Recent Results (from the past 240 hour(s))  Resp Panel by RT-PCR (Flu A&B, Covid) Nasopharyngeal Swab     Status: None   Collection Time: 12/23/20 11:02 AM   Specimen: Nasopharyngeal Swab; Nasopharyngeal(NP) swabs in vial transport medium  Result Value Ref Range Status   SARS Coronavirus 2 by RT PCR NEGATIVE NEGATIVE Final    Comment: (NOTE) SARS-CoV-2 target nucleic acids are NOT DETECTED.  The SARS-CoV-2 RNA is generally detectable in upper respiratory specimens during the acute phase of infection. The lowest concentration of SARS-CoV-2 viral copies this assay can detect is 138 copies/mL. A negative result does not preclude SARS-Cov-2 infection and should not be used as the sole basis for treatment or other patient management decisions. A negative result may occur with  improper specimen collection/handling, submission of specimen other than nasopharyngeal swab, presence of viral mutation(s) within the areas targeted by this assay, and inadequate number of viral copies(<138 copies/mL). A negative result must be combined with clinical  observations, patient history, and epidemiological information. The expected result is Negative.  Fact Sheet for Patients:  EntrepreneurPulse.com.au  Fact Sheet for Healthcare Providers:  IncredibleEmployment.be  This test is no t yet approved or cleared by the [place] FDA and  has been authorized for detection and/or diagnosis of SARS-CoV-2 by FDA under an Emergency Use Authorization (EUA). This EUA will remain  in effect (meaning this test can be used) for the duration of the COVID-19 declaration under Section 564(b)(1) of the Act, 21 U.S.C.section 360bbb-3(b)(1), unless the authorization is terminated  or revoked sooner.       Influenza A by PCR NEGATIVE NEGATIVE Final   Influenza B by PCR NEGATIVE NEGATIVE Final    Comment: (NOTE) The Xpert Xpress SARS-CoV-2/FLU/RSV plus assay is intended as an aid in the diagnosis  of influenza from Nasopharyngeal swab specimens and should not be used as a sole basis for treatment. Nasal washings and aspirates are unacceptable for Xpert Xpress SARS-CoV-2/FLU/RSV testing.  Fact Sheet for Patients: EntrepreneurPulse.com.au  Fact Sheet for Healthcare Providers: IncredibleEmployment.be  This test is not yet approved or cleared by the [place] FDA and has been authorized for detection and/or diagnosis of SARS-CoV-2 by FDA under an Emergency Use Authorization (EUA). This EUA will remain in effect (meaning this test can be used) for the duration of the COVID-19 declaration under Section 564(b)(1) of the Act, 21 U.S.C. section 360bbb-3(b)(1), unless the authorization is terminated or revoked.  Performed at [facility], [address] [place], [place] [address]          Radiology Studies: [facility] Chest [place] 1 View  Result Date: 12/26/2020 CLINICAL DATA:  Shortness of breath. EXAM: PORTABLE CHEST 1 VIEW COMPARISON:  December 23, 2020. FINDINGS: Stable  cardiomegaly. Status post coronary artery bypass graft. No pneumothorax is noted. Interval development of right midlung subsegmental atelectasis or infiltrate. Stable bibasilar atelectasis is noted with probable small right pleural effusion. Bony thorax is unremarkable. IMPRESSION: Stable bibasilar atelectasis with probable small right pleural effusion. Interval development of right midlung subsegmental atelectasis or infiltrate. Electronically Signed   By: Marijo Conception M.D.   On: 12/26/2020 08:28        Scheduled Meds:  atorvastatin  40 mg Oral Q supper   carvedilol  3.125 mg Oral BID WC   digoxin  0.0625 mg Oral Daily   famotidine  10 mg Oral QODAY   furosemide  40 mg Intravenous BID   midodrine  10 mg Oral TID WC   pantoprazole  40 mg Oral BID   ranolazine  500 mg Oral BID   sucralfate  1 g Oral BID   tamsulosin  0.4 mg Oral QPC supper   Continuous Infusions:   LOS: 2 days    Time spent: 31 mins     Wyvonnia Dusky, MD [facility] Pager [phone]  If 7PM-7AM, please contact night-coverage 12/26/2020, 2:25 PM

## 2020-12-26 NOTE — Progress Notes (Signed)
Initial Nutrition Assessment  DOCUMENTATION CODES:   Severe malnutrition in context of chronic illness  INTERVENTION:   Ensure Enlive po TID, each supplement provides 350 kcal and 20 grams of protein  Magic cup TID with meals, each supplement provides 290 kcal and 9 grams of protein  MVI po daily   Liberalize diet   Pt at high refeed risk; recommend monitor potassium, magnesium and phosphorus labs daily until stable  NUTRITION DIAGNOSIS:   Severe Malnutrition related to chronic illness (CHF, MDS, advanced age) as evidenced by severe muscle depletion, severe fat depletion.  GOAL:   Patient will meet greater than or equal to 90% of their needs  MONITOR:   PO intake, Supplement acceptance, Labs, Weight trends, Skin, I & O's  REASON FOR ASSESSMENT:   Malnutrition Screening Tool    ASSESSMENT:   87 year old white male with a history of combined systolic diastolic heart failure with an EF of 25%, hypertension, CKD stage IIIb, myelodysplastic syndrome, recurrent right pleural effusion, chronic atrial fibrillation not on anticoagulation due to history of prior GI bleed and BPH who is admitted with shortness of breath.  Met with pt in room today. Pt reports poor appetite and oral intake for several weeks pta r/t frequent hospital admissions. Per chart, pt is down 8lbs(5%) over the past month; this is significant. Pt reports that he has been drinking one chocolate Boost daily at home. Pt reports eating 100% of his lunch today; pt is documented to be eating 40-100% of meals in hospital. RD will add supplements and MVI to help pt meet his estimated needs. RD will also liberalize pt's diet as a heart healthy diet is restrictive of protein. Pt is likely at refeed risk.   Medications reviewed and include: pepcid, lasix, protonix  Labs reviewed: K 3.9 wnl, Mg 1.8 wnl Hgb 8.9(L), Hct 25.9(L)  NUTRITION - FOCUSED PHYSICAL EXAM:  Flowsheet Row Most Recent Value  Orbital Region Moderate  depletion  Upper Arm Region Severe depletion  Thoracic and Lumbar Region Severe depletion  Buccal Region Moderate depletion  Temple Region Severe depletion  Clavicle Bone Region Severe depletion  Clavicle and Acromion Bone Region Severe depletion  Scapular Bone Region Severe depletion  Dorsal Hand Severe depletion  Patellar Region Severe depletion  Anterior Thigh Region Severe depletion  Posterior Calf Region Severe depletion  Edema (RD Assessment) None  Hair Reviewed  Eyes Reviewed  Mouth Reviewed  Skin Reviewed  Nails Reviewed   Diet Order:   Diet Order             Diet Heart Room service appropriate? Yes; Fluid consistency: Thin  Diet effective now                  EDUCATION NEEDS:   Education needs have been addressed  Skin:  Skin Assessment: Reviewed RN Assessment (ecchymosis)  Last BM:  8/12- type 4  Height:   Ht Readings from Last 1 Encounters:  12/23/20 _0  (1.93 m)    Weight:   Wt Readings from Last 1 Encounters:  12/26/20 78.5 kg    Ideal Body Weight:  91.8 kg  BMI:  Body mass index is 21.07 kg/m.  Estimated Nutritional Needs:   Kcal:  2100-2400kcal/day  Protein:  104-120g/day  Fluid:  2.0-2.3L/day  [name], RD, [name] Please refer to [facility] for RD and/or RD on-call/weekend/after hours pager

## 2020-12-26 NOTE — Plan of Care (Signed)

## 2020-12-26 NOTE — Progress Notes (Signed)
[facility] Cardiology    SUBJECTIVE: Patient states shortness of breath is improved but he still has some denies any chest pain no leg edema denies palpitations or tachycardia.   Vitals:   12/25/20 1710 12/25/20 2004 12/26/20 0355 12/26/20 0529  BP: (!) 94/59 (!) 102/59 (!) 102/46   Pulse: 79 72 72   Resp:  18 18   Temp:  97.6 F (36.4 C) (!) 97.5 F (36.4 C)   TempSrc:   Oral   SpO2: 98% 97% 90%   Weight:    78.5 kg  Height:         Intake/Output Summary (Last 24 hours) at 12/26/2020 0836 Last data filed at 12/25/2020 2002 Gross per 24 hour  Intake 1200 ml  Output 1400 ml  Net -200 ml      PHYSICAL EXAM  General: Well developed, well nourished, in no acute distress HEENT:  Normocephalic and atramatic Neck:  No JVD.  Lungs: Clear bilaterally to auscultation and percussion. Heart: HRRR . Normal S1 and S2 without gallops or murmurs.  Abdomen: Bowel sounds are positive, abdomen soft and non-tender  Msk:  Back normal, normal gait. Normal strength and tone for age. Extremities: No clubbing, cyanosis or edema.   Neuro: Alert and oriented X 3. Psych:  Good affect, responds appropriately   LABS: Basic Metabolic Panel: Recent Labs    12/25/20 0529 12/26/20 0436  NA 136 135  K 4.2 3.9  CL 98 97*  CO2 27 27  GLUCOSE 104* 88  BUN 45* 44*  CREATININE 2.36* 2.19*  CALCIUM 9.3 9.0  MG 1.9 1.8   Liver Function Tests: Recent Labs    12/23/20 0925 12/24/20 0411  AST 24 21  ALT 14 14  ALKPHOS 68 68  BILITOT 1.5* 1.4*  PROT 7.2 6.7  ALBUMIN 4.3 3.9   Recent Labs    12/23/20 0925  LIPASE 46   CBC: Recent Labs    12/23/20 0925 12/24/20 0411 12/25/20 0529 12/26/20 0436  WBC 6.2 7.3 5.9 6.1  NEUTROABS 2.3 3.0  --   --   HGB 9.1* 8.7* 9.2* 8.9*  HCT 28.1* 26.2* 28.4* 25.9*  MCV 101.4* 100.4* 100.0 99.2  PLT 154 143* 144* 157   Cardiac Enzymes: No results for input(s): CKTOTAL, CKMB, CKMBINDEX, TROPONINI in the last 72 hours. BNP: Invalid input(s):  POCBNP D-Dimer: No results for input(s): DDIMER in the last 72 hours. Hemoglobin A1C: No results for input(s): HGBA1C in the last 72 hours. Fasting Lipid Panel: No results for input(s): CHOL, HDL, LDLCALC, TRIG, CHOLHDL, LDLDIRECT in the last 72 hours. Thyroid Function Tests: No results for input(s): TSH, T4TOTAL, T3FREE, THYROIDAB in the last 72 hours.  Invalid input(s): FREET3 Anemia Panel: Recent Labs    12/25/20 0529  VITAMINB12 1,875*  FOLATE 30.0    DG Chest Port 1 View  Result Date: 12/26/2020 CLINICAL DATA:  Shortness of breath. EXAM: PORTABLE CHEST 1 VIEW COMPARISON:  December 23, 2020. FINDINGS: Stable cardiomegaly. Status post coronary artery bypass graft. No pneumothorax is noted. Interval development of right midlung subsegmental atelectasis or infiltrate. Stable bibasilar atelectasis is noted with probable small right pleural effusion. Bony thorax is unremarkable. IMPRESSION: Stable bibasilar atelectasis with probable small right pleural effusion. Interval development of right midlung subsegmental atelectasis or infiltrate. Electronically Signed   By: Marijo Conception M.D.   On: 12/26/2020 08:28   ECHOCARDIOGRAM COMPLETE  Result Date: 12/24/2020    ECHOCARDIOGRAM REPORT   Patient Name:   Andres Gutierrez Date  of Exam: 12/24/2020 Medical Rec #:  [id number]    Height:       76.0 in Accession #:    [id number]   Weight:       169.3 lb Date of Birth:  05/13/1931    BSA:          2.064 m Patient Age:    87 years     BP:           97/53 mmHg Patient Gender: M            HR:           85 bpm. Exam Location:  [facility] Procedure: 2D Echo and Intracardiac Opacification Agent Indications:     Systolic CHF  History:         Patient has prior history of Echocardiogram examinations. CHF                  and Cardiomegaly, Previous Myocardial Infarction, Prior CABG;                  Risk Factors:Hypertension.  Sonographer:     L Thornton-Maynard Referring Phys:  [id number] ERIC CHEN Diagnosing Phys: Yolonda Kida MD IMPRESSIONS  1. Left ventricular ejection fraction, by estimation, is <20%. The left ventricle has severely decreased function. The left ventricle demonstrates global hypokinesis. The left ventricular internal cavity size was severely dilated. Left ventricular diastolic parameters were normal.  2. Right ventricular systolic function is moderately reduced. The right ventricular size is moderately enlarged. There is normal pulmonary artery systolic pressure.  3. Left atrial size was moderately dilated.  4. Right atrial size was moderately dilated.  5. The mitral valve is grossly normal. Mild mitral valve regurgitation.  6. The aortic valve is calcified. Aortic valve regurgitation is mild. Moderate to severe aortic valve stenosis. FINDINGS  Left Ventricle: Left ventricular ejection fraction, by estimation, is <20%. The left ventricle has severely decreased function. The left ventricle demonstrates global hypokinesis. Definity contrast agent was given IV to delineate the left ventricular endocardial borders. The left ventricular internal cavity size was severely dilated. There is no left ventricular hypertrophy. Abnormal (paradoxical) septal motion, consistent with left bundle branch block. Left ventricular diastolic parameters were normal. Right Ventricle: The right ventricular size is moderately enlarged. No increase in right ventricular wall thickness. Right ventricular systolic function is moderately reduced. There is normal pulmonary artery systolic pressure. The tricuspid regurgitant velocity is 2.61 m/s, and with an assumed right atrial pressure of 3 mmHg, the estimated right ventricular systolic pressure is 53.9 mmHg. Left Atrium: Left atrial size was moderately dilated. Right Atrium: Right atrial size was moderately dilated. Pericardium: Trivial pericardial effusion is present. Mitral Valve: The mitral valve is grossly normal. Mild mitral valve regurgitation. MV peak gradient, 9.9 mmHg. The mean  mitral valve gradient is 5.0 mmHg. Tricuspid Valve: The tricuspid valve is grossly normal. Tricuspid valve regurgitation is mild. Aortic Valve: The aortic valve is calcified. Aortic valve regurgitation is mild. Moderate to severe aortic stenosis is present. Aortic valve mean gradient measures 11.5 mmHg. Aortic valve peak gradient measures 18.7 mmHg. Aortic valve area, by VTI measures 1.36 cm. Pulmonic Valve: The pulmonic valve was grossly normal. Pulmonic valve regurgitation is not visualized. Aorta: The ascending aorta was not well visualized. IAS/Shunts: No atrial level shunt detected by color flow Doppler. Additional Comments: There is pleural effusion in both left and right lateral regions.  LEFT VENTRICLE PLAX 2D LVIDd:  6.92 cm  Diastology LVIDs:         5.92 cm  LV e' medial:    5.51 cm/s LV PW:         1.08 cm  LV E/e' medial:  22.5 LV IVS:        0.91 cm  LV e' lateral:   7.14 cm/s LVOT diam:     2.20 cm  LV E/e' lateral: 17.4 LV SV:         52 LV SV Index:   25 LVOT Area:     3.80 cm  RIGHT VENTRICLE RV S prime:     7.70 cm/s TAPSE (M-mode): 1.1 cm LEFT ATRIUM              Index LA diam:        5.10 cm  2.47 cm/m LA Vol (A2C):   126.0 ml 61.05 ml/m LA Vol (A4C):   108.0 ml 52.33 ml/m LA Biplane Vol: 120.0 ml 58.15 ml/m  AORTIC VALVE                    PULMONIC VALVE AV Area (Vmax):    1.28 cm     PV Vmax:       1.10 m/s AV Area (Vmean):   1.36 cm     PV Peak grad:  4.8 mmHg AV Area (VTI):     1.36 cm AV Vmax:           216.50 cm/s AV Vmean:          161.000 cm/s AV VTI:            0.386 m AV Peak Grad:      18.7 mmHg AV Mean Grad:      11.5 mmHg LVOT Vmax:         73.10 cm/s LVOT Vmean:        57.700 cm/s LVOT VTI:          0.138 m LVOT/AV VTI ratio: 0.36  AORTA Ao Root diam: 3.30 cm Ao Asc diam:  4.20 cm MITRAL VALVE                TRICUSPID VALVE MV Area (PHT): 4.31 cm     TR Peak grad:   27.2 mmHg MV Area VTI:   1.44 cm     TR Vmax:        261.00 cm/s MV Peak grad:  9.9 mmHg MV Mean  grad:  5.0 mmHg     SHUNTS MV Vmax:       1.57 m/s     Systemic VTI:  0.14 m MV Vmean:      108.0 cm/s   Systemic Diam: 2.20 cm MV Decel Time: 176 msec MV E velocity: 124.00 cm/s Elby Blackwelder D Rikita Grabert MD Electronically signed by Yolonda Kida MD Signature Date/Time: 12/24/2020/9:26:05 PM    Final      Echo severely depressed left ventricular function less than 20%  TELEMETRY: Sinus rhythm nonspecific ST-T changes rate of 70:  ASSESSMENT AND PLAN:  Principal Problem:   Acute on chronic combined systolic (congestive) and diastolic (congestive) heart failure (HCC) Active Problems:   Chronic atrial fibrillation (HCC)   BPH (benign prostatic hyperplasia)   CKD (chronic kidney disease), stage IIIb   MDS (myelodysplastic syndrome), low grade (HCC)   Recurrent right pleural effusion   DNR (do not resuscitate)/DNI(Do Not Intubate)   Acute combined systolic (congestive) and diastolic (congestive) heart failure (HCC)   CHF (congestive  heart failure) (HCC)    Plan Congestive heart failure stable continue diuretic therapy with Lasix Chronic insufficiency of the patient follow-up with nephrology Rate control for atrial fibrillation poor anticoagulation candidate Continue Lasix therapy for pleural effusion heart failure Famotidine Protonix for GERD Continue Coreg digoxin Lasix for heart failure Unable to tolerate Entresto because of hypotension Continue Lipitor therapy for lipid management Increase activity hopefully with physical therapy   Yolonda Kida, MD 12/26/2020 8:36 AM

## 2020-12-26 NOTE — Evaluation (Signed)
Physical Therapy Evaluation Patient Details Name: Andres Gutierrez MRN: [id number] DOB: February 08, 1931 Today's Date: 12/26/2020   History of Present Illness  87 year old white male with a history of combined systolic diastolic heart failure with an EF of 25%, hypertension, CKD stage IIIb, myelodysplastic syndrome, recurrent right pleural effusion, chronic atrial fibrillation not on anticoagulation due to history of prior GI bleed, BPH who presents to the ER today with worsening shortness of breath.  Daughter states the patient has been short of breath for last several weeks.  Patient recently discharged at the end of July for about of heart failure.  Patient recently had a packed red blood cell transfusion in hematology clinic due to a low hemoglobin.   Clinical Impression  Pt admitted with above diagnosis. Pt received upright in bed with daughter present in room. Agreeable to PT services. Pt recently admitted for SOB and able to report same home set up, PLOF, DME, etc. With no difficulty. NT in room to take vitals prior to OOB mobility with PT. BP at 94/45 mm Hg in supine spo2 at 89-90% and HR 71 BPM. RN gave pt meds seated EOB to increase BP (Midodrine). Pt mod-I for bed mobility from supine to seated EOB. From the bed in lowered position and safe hand placement, pt able to stand to RW with supervision. Onset of minor dizziness reported with standing that did not improve after 2 min standing. SPO2 hovering at 86% on RA with standing. Deferred further ambulation so pt side stepped to seated in recliner with good sequencing and safety awareness. SPO2 returned to 89-90% with seated rest in recliner with education on pursed lip breathing. Good hand placement. Anticipate if Spo2 remains > 90% with functional activity and no dizziness, pt will be safe to return home due to ease of performing bed mobility and transferring to recliner with no physical assist. Pt currently with functional limitations due to the deficits  listed below (see PT Problem List). Pt will benefit from skilled PT to increase their independence and safety with mobility to allow discharge to the venue listed below.       Follow Up Recommendations Home health PT;Supervision - Intermittent    Equipment Recommendations  None recommended by PT    Recommendations for Other Services       Precautions / Restrictions Precautions Precautions: None Restrictions Weight Bearing Restrictions: No      Mobility  Bed Mobility Overal bed mobility: Modified Independent Bed Mobility: Supine to Sit     Supine to sit: Modified independent (Device/Increase time) Sit to supine: Modified independent (Device/Increase time)        Transfers Overall transfer level: Needs assistance Equipment used: Rolling walker (2 wheeled) Transfers: Sit to/from Stand Sit to Stand: Supervision         General transfer comment: good safety awareness  Ambulation/Gait Ambulation/Gait assistance: Supervision Gait Distance (Feet): 2 Feet Assistive device: Rolling walker (2 wheeled) Gait Pattern/deviations: Trunk flexed;Narrow base of support;Step-through pattern     General Gait Details: Did not progress gait due to SPO2 86% and dizzinesss  Stairs            Wheelchair Mobility    Modified Rankin (Stroke Patients Only)       Balance Overall balance assessment: Needs assistance Sitting-balance support: No upper extremity supported;Feet supported Sitting balance-[name]: Good     Standing balance support: Bilateral upper extremity supported;During functional activity Standing balance-[name]: Fair  Pertinent Vitals/Pain Pain Assessment: No/denies pain    Home Living Family/patient expects to be discharged to:: Private residence Living Arrangements: Alone Available Help at Discharge: Family;Available PRN/intermittently Type of Home: House Home Access: Stairs to enter Entrance  Stairs-Rails: Right;Left;Can reach both Entrance Stairs-Number of Steps: 2 Home Layout: One level Home Equipment: Walker - 2 wheels;Cane - single point;Bedside commode;Shower seat;Grab bars - tub/shower      Prior Function Level of Independence: Independent with assistive device(s)         Comments: Mod I with RW, doesn't drive (children assist with that), bathes, dresses, no falls in past 6 months.Daughter completes grocery shopping.     Hand Dominance   Dominant Hand: Right    Extremity/Trunk Assessment   Upper Extremity Assessment Upper Extremity Assessment: Overall WFL for tasks assessed    Lower Extremity Assessment Lower Extremity Assessment: Overall WFL for tasks assessed    Cervical / Trunk Assessment Cervical / Trunk Assessment: Kyphotic  Communication   Communication: HOH  Cognition Arousal/Alertness: Awake/alert Behavior During Therapy: WFL for tasks assessed/performed Overall Cognitive Status: Within Functional Limits for tasks assessed                                        General Comments General comments (skin integrity, edema, etc.): SPo2 at 86% with functional activity on RA.    Exercises Other Exercises Other Exercises: Educated on pusred lip breathing to assist SPo2 > 90%   Assessment/Plan    PT Assessment Patient needs continued PT services  PT Problem List Decreased strength;Decreased mobility;Decreased activity tolerance;Decreased balance;Cardiopulmonary status limiting activity       PT Treatment Interventions DME instruction;Therapeutic activities;Gait training;Therapeutic exercise;Patient/family education;Stair training;Balance training;Functional mobility training;Neuromuscular re-education    PT Goals (Current goals can be found in the Care Plan section)  Acute Rehab PT Goals Patient Stated Goal: go home PT Goal Formulation: With patient Time For Goal Achievement: 01/09/21 Potential to Achieve Goals: Good     Frequency Min 2X/week   Barriers to discharge Decreased caregiver support      Co-evaluation               AM-PAC PT "6 Clicks" Mobility  Outcome Measure Help needed turning from your back to your side while in a flat bed without using bedrails?: None Help needed moving from lying on your back to sitting on the side of a flat bed without using bedrails?: None Help needed moving to and from a bed to a chair (including a wheelchair)?: A Little Help needed standing up from a chair using your arms (e.g., wheelchair or bedside chair)?: A Little Help needed to walk in hospital room?: A Little Help needed climbing 3-5 steps with a railing? : A Little 6 Click Score: 20    End of Session Equipment Utilized During Treatment: Gait belt Activity Tolerance: Patient tolerated treatment well Patient left: in chair;with call bell/phone within reach Nurse Communication: Mobility status PT Visit Diagnosis: Muscle weakness (generalized) (M62.81);Difficulty in walking, not elsewhere classified (R26.2)    Time: 2595-6387 PT Time Calculation (min) (ACUTE ONLY): 23 min   Charges:   PT Evaluation $PT Eval Low Complexity: 1 Low PT Treatments $Therapeutic Activity: 23-37 mins       Ruhaan Nordahl M. Fairly IV, PT, DPT Physical Therapist- [facility]  12/26/2020, 11:00 AM

## 2020-12-27 DIAGNOSIS — E43 Unspecified severe protein-calorie malnutrition: Secondary | ICD-10-CM | POA: Insufficient documentation

## 2020-12-27 LAB — BASIC METABOLIC PANEL
Anion gap: 8 (ref 5–15)
BUN: 48 mg/dL — ABNORMAL HIGH (ref 8–23)
CO2: 29 mmol/L (ref 22–32)
Calcium: 9 mg/dL (ref 8.9–10.3)
Chloride: 95 mmol/L — ABNORMAL LOW (ref 98–111)
Creatinine, Ser: 2.28 mg/dL — ABNORMAL HIGH (ref 0.61–1.24)
GFR, Estimated: 27 mL/min — ABNORMAL LOW (ref >60)
Glucose, Bld: 93 mg/dL (ref 70–99)
Potassium: 4.6 mmol/L (ref 3.5–5.1)
Sodium: 132 mmol/L — ABNORMAL LOW (ref 135–145)

## 2020-12-27 LAB — CBC
HCT: 28.5 % — ABNORMAL LOW (ref 39.0–52.0)
Hemoglobin: 9.7 g/dL — ABNORMAL LOW (ref 13.0–17.0)
MCH: 34.2 pg — ABNORMAL HIGH (ref 26.0–34.0)
MCHC: 34 g/dL (ref 30.0–36.0)
MCV: 100.4 fL — ABNORMAL HIGH (ref 80.0–100.0)
Platelets: 144 10*3/uL — ABNORMAL LOW (ref 150–400)
RBC: 2.84 MIL/uL — ABNORMAL LOW (ref 4.22–5.81)
RDW: 17.9 % — ABNORMAL HIGH (ref 11.5–15.5)
WBC: 6.6 10*3/uL (ref 4.0–10.5)
nRBC: 0 % (ref 0.0–0.2)

## 2020-12-27 LAB — MAGNESIUM: Magnesium: 1.8 mg/dL (ref 1.7–2.4)

## 2020-12-27 NOTE — Progress Notes (Signed)
PROGRESS NOTE   HPI was taken from Dr. Bridgett Larsson: 87 year old white male with a history of combined systolic diastolic heart failure with an EF of 25%, hypertension, CKD stage IIIb, myelodysplastic syndrome, recurrent right pleural effusion, chronic atrial fibrillation not on anticoagulation due to history of prior GI bleed, BPH who presents to the ER today with worsening shortness of breath.  Daughter states the patient has been short of breath for last several weeks.  Patient recently discharged at the end of July for about of heart failure.  Patient recently had a packed red blood cell transfusion in hematology clinic due to a low hemoglobin.  This was 3 days ago.  Patient's had worsening shortness of breath since then.  He was seen in the cardiology office yesterday and had a dose of IV Lasix.  Patient states that he urinated about 1 to 2 L overnight however patient still symptomatically short of breath.  Patient recently seen by nephrology due to CKD stage IIIb.  No plans were made yet to discuss dialysis for him yet.  In the ER, chest x-ray demonstrated pulmonary edema.  Bedside thoracic ultrasound demonstrated right pleural effusion.  Laboratory evaluation showed serum creatinine of 2.36.  Hemoglobin is improved to 9.1 from a baseline of 7.63 days ago.  BNP was elevated to 1557.  This is increased from the end of July where it was 1091.   Due to acute on chronic systolic heart failure, [facility] hospitalist contacted for admission.    ED Course: cxr shows pulmonary edema, right pleural effusion. Started on IV lasix.   Hospital course from Dr. Jimmye Norman 8/13-8/16/22: Pt presented w/ shortness of breath likely secondary to CHF exacerbation. Repeat echo shows EF <93%, normal diastolic function. Pt has been treated w/ IV lasix, coreg, digoxin as per cardio. Of note, pt has hx of MDS and is likely transfusion dependent but has not required a transfusion since admission.    ILIR MAHRT  JQZ:[id number]  DOB: 03-Nov-1930 DOA: 12/23/2020 PCP: Rusty Aus, MD   Assessment & Plan:   Principal Problem:   Acute on chronic combined systolic (congestive) and diastolic (congestive) heart failure (HCC) Active Problems:   Chronic atrial fibrillation (HCC)   BPH (benign prostatic hyperplasia)   CKD (chronic kidney disease), stage IIIb   MDS (myelodysplastic syndrome), low grade (HCC)   Recurrent right pleural effusion   DNR (do not resuscitate)/DNI(Do Not Intubate)   Acute combined systolic (congestive) and diastolic (congestive) heart failure (HCC)   CHF (congestive heart failure) (HCC)   Protein-calorie malnutrition, severe  Acute on chronic combined CHF exacerbation: echo showed EF <62%, normal diastolic function. Continue on IV lasix, coreg & digoxin as per cardio. Monitor I/Os and daily weights.   MDS: chronic. S/p pRBC transfusion 3 days prior to admission to the hospital. Likely etiology on anemia & thrombocytopenia. B12, folate are WNL   Recurrent right pleural effusion: repeat CXR still shows small pleural effusion unlikely amenable thoracentesis at this time. Continue on IV lasix   CKDIIIb: Cr is labile. Consider holding lasix if Cr continues to trend up. Avoid nephrotoxic meds   Chronic a. fib: continue on digoxin, coreg. Not on anticoagulation secondary to hx of GI bleeding  BPH: continue on home dose of tamsulosin   Hypomagnesemia: WNL   Severe protein-calorie malnutrition: continue on nutritional supplements   DVT prophylaxis: SCDs Code Status: DNR Family Communication:  Disposition Plan: PT/OT recs HH   Level of care: Med-Surg  Status is: Inpatient  Remains inpatient  appropriate because:Unsafe d/c plan, IV treatments appropriate due to intensity of illness or inability to take PO, and Inpatient level of care appropriate due to severity of illness, Cr is trending up today. Can likely d/c in 24-48 hours   Dispo: The patient is from: Home              Anticipated d/c is  to: Home w/ [facility]               Patient currently is not medically stable to d/c.   Difficult to place patient unclear   Consultants:  cardio  Procedures:  Antimicrobials:   Subjective: Pt c/o malaise   Objective: Vitals:   12/26/20 1528 12/26/20 2105 12/26/20 2110 12/27/20 0347  BP: 91/62  (!) 105/58 (!) 103/56  Pulse: 82  81 85  Resp: 17  18 20   Temp: 98.2 F (36.8 C)  (!) 97.5 F (36.4 C) (!) 97.5 F (36.4 C)  TempSrc: Oral     SpO2: 98% 98% 100% 98%  Weight:    75.1 kg  Height:        Intake/Output Summary (Last 24 hours) at 12/27/2020 0742 Last data filed at 12/26/2020 2225 Gross per 24 hour  Intake 960 ml  Output 1050 ml  Net -90 ml   Filed Weights   12/25/20 0600 12/26/20 0529 12/27/20 0347  Weight: 75.1 kg 78.5 kg 75.1 kg    Examination:  General exam: Appears comfortable Respiratory system: decreased breath sounds b/l Cardiovascular system: S1/S2+. No rubs or clicks   Gastrointestinal system: Abd is soft, NT, ND & hypoactive bowel sounds  Central nervous system: Alert and oriented. Moves all extremities  Psychiatry: Judgement and insight appear poor. Flat mood and affect    Data Reviewed: I have personally reviewed following labs and imaging studies  CBC: Recent Labs  Lab 12/20/20 0946 12/23/20 0925 12/24/20 0411 12/25/20 0529 12/26/20 0436 12/27/20 0425  WBC 6.2 6.2 7.3 5.9 6.1 6.6  NEUTROABS 2.3 2.3 3.0  --   --   --   HGB 7.6* 9.1* 8.7* 9.2* 8.9* 9.7*  HCT 23.5* 28.1* 26.2* 28.4* 25.9* 28.5*  MCV 100.4* 101.4* 100.4* 100.0 99.2 100.4*  PLT 138* 154 143* 144* 157 818*   Basic Metabolic Panel: Recent Labs  Lab 12/23/20 0925 12/24/20 0411 12/25/20 0529 12/26/20 0436 12/27/20 0425  NA 136 136 136 135 132*  K 4.5 4.3 4.2 3.9 4.6  CL 100 98 98 97* 95*  CO2 26 26 27 27 29   GLUCOSE 105* 91 104* 88 93  BUN 43* 44* 45* 44* 48*  CREATININE 2.36* 2.40* 2.36* 2.19* 2.28*  CALCIUM 9.2 9.1 9.3 9.0 9.0  MG  --  1.6* 1.9 1.8 1.8    GFR: Estimated Creatinine Clearance: 24.2 mL/min (A) (by C-G formula based on SCr of 2.28 mg/dL (H)). Liver Function Tests: Recent Labs  Lab 12/23/20 0925 12/24/20 0411  AST 24 21  ALT 14 14  ALKPHOS 68 68  BILITOT 1.5* 1.4*  PROT 7.2 6.7  ALBUMIN 4.3 3.9   Recent Labs  Lab 12/23/20 0925  LIPASE 46   No results for input(s): AMMONIA in the last 168 hours. Coagulation Profile: No results for input(s): INR, PROTIME in the last 168 hours. Cardiac Enzymes: No results for input(s): CKTOTAL, CKMB, CKMBINDEX, TROPONINI in the last 168 hours. BNP (last 3 results) No results for input(s): PROBNP in the last 8760 hours. HbA1C: No results for input(s): HGBA1C in the last 72 hours. CBG: No  results for input(s): GLUCAP in the last 168 hours. Lipid Profile: No results for input(s): CHOL, HDL, LDLCALC, TRIG, CHOLHDL, LDLDIRECT in the last 72 hours. Thyroid Function Tests: No results for input(s): TSH, T4TOTAL, FREET4, T3FREE, THYROIDAB in the last 72 hours. Anemia Panel: Recent Labs    12/25/20 0529  VITAMINB12 1,875*  FOLATE 30.0   Sepsis Labs: No results for input(s): PROCALCITON, LATICACIDVEN in the last 168 hours.  Recent Results (from the past 240 hour(s))  Resp Panel by RT-PCR (Flu A&B, Covid) Nasopharyngeal Swab     Status: None   Collection Time: 12/23/20 11:02 AM   Specimen: Nasopharyngeal Swab; Nasopharyngeal(NP) swabs in vial transport medium  Result Value Ref Range Status   SARS Coronavirus 2 by RT PCR NEGATIVE NEGATIVE Final    Comment: (NOTE) SARS-CoV-2 target nucleic acids are NOT DETECTED.  The SARS-CoV-2 RNA is generally detectable in upper respiratory specimens during the acute phase of infection. The lowest concentration of SARS-CoV-2 viral copies this assay can detect is 138 copies/mL. A negative result does not preclude SARS-Cov-2 infection and should not be used as the sole basis for treatment or other patient management decisions. A negative  result may occur with  improper specimen collection/handling, submission of specimen other than nasopharyngeal swab, presence of viral mutation(s) within the areas targeted by this assay, and inadequate number of viral copies(<138 copies/mL). A negative result must be combined with clinical observations, patient history, and epidemiological information. The expected result is Negative.  Fact Sheet for Patients:  EntrepreneurPulse.com.au  Fact Sheet for Healthcare Providers:  IncredibleEmployment.be  This test is no t yet approved or cleared by the [place] FDA and  has been authorized for detection and/or diagnosis of SARS-CoV-2 by FDA under an Emergency Use Authorization (EUA). This EUA will remain  in effect (meaning this test can be used) for the duration of the COVID-19 declaration under Section 564(b)(1) of the Act, 21 U.S.C.section 360bbb-3(b)(1), unless the authorization is terminated  or revoked sooner.       Influenza A by PCR NEGATIVE NEGATIVE Final   Influenza B by PCR NEGATIVE NEGATIVE Final    Comment: (NOTE) The Xpert Xpress SARS-CoV-2/FLU/RSV plus assay is intended as an aid in the diagnosis of influenza from Nasopharyngeal swab specimens and should not be used as a sole basis for treatment. Nasal washings and aspirates are unacceptable for Xpert Xpress SARS-CoV-2/FLU/RSV testing.  Fact Sheet for Patients: EntrepreneurPulse.com.au  Fact Sheet for Healthcare Providers: IncredibleEmployment.be  This test is not yet approved or cleared by the [place] FDA and has been authorized for detection and/or diagnosis of SARS-CoV-2 by FDA under an Emergency Use Authorization (EUA). This EUA will remain in effect (meaning this test can be used) for the duration of the COVID-19 declaration under Section 564(b)(1) of the Act, 21 U.S.C. section 360bbb-3(b)(1), unless the authorization is  terminated or revoked.  Performed at [facility], [address] [place], [place] [address]          Radiology Studies: [facility] Chest [place] 1 View  Result Date: 12/26/2020 CLINICAL DATA:  Shortness of breath. EXAM: PORTABLE CHEST 1 VIEW COMPARISON:  December 23, 2020. FINDINGS: Stable cardiomegaly. Status post coronary artery bypass graft. No pneumothorax is noted. Interval development of right midlung subsegmental atelectasis or infiltrate. Stable bibasilar atelectasis is noted with probable small right pleural effusion. Bony thorax is unremarkable. IMPRESSION: Stable bibasilar atelectasis with probable small right pleural effusion. Interval development of right midlung subsegmental atelectasis or infiltrate. Electronically Signed  By: Marijo Conception M.D.   On: 12/26/2020 08:28        Scheduled Meds:  atorvastatin  40 mg Oral Q supper   carvedilol  3.125 mg Oral BID WC   digoxin  0.0625 mg Oral Daily   famotidine  10 mg Oral QODAY   feeding supplement  237 mL Oral TID BM   furosemide  40 mg Intravenous BID   midodrine  10 mg Oral TID WC   multivitamin with minerals  1 tablet Oral Daily   pantoprazole  40 mg Oral BID   ranolazine  500 mg Oral BID   sucralfate  1 g Oral BID   tamsulosin  0.4 mg Oral QPC supper   Continuous Infusions:   LOS: 3 days    Time spent: 25 mins     Wyvonnia Dusky, MD [facility] Pager [phone]  If 7PM-7AM, please contact night-coverage 12/27/2020, 7:42 AM

## 2020-12-27 NOTE — Progress Notes (Addendum)
   Heart Failure Nurse Navigator Note    Met with patient today, son is at the bedside.  Again discussed his diet.  States that he gets sausage biscuit 3 times a week, and discussed that that contains half of his daily allotment of sodium.  Need to make sure on those days that he is trying to eat  lower sodium foods. he states that for the other meals he will eat canned [place] soup, likes tomato, and will eat boiled ham sandwiches.    Discussed using low sodium soups and using lean chicken or [place] breast for sandwiches.  As he is stated that he was eating boiled ham which each slice can contain from 680 mg of sodium per slice up to 340 mg. Also discussed fresh and frozen fruits and vegetables, he states he does not eat vegetables.  Also discussed with patient and son  why he cancels his Heart Failure Clinic appointments and he states it is because I am usually not feeling well, not feeling up to leaving my home.  Discussed the relationship of sodium and liquids that he drinks.  Patient and son had no further questions.  Pricilla Riffle RN [facility]

## 2020-12-27 NOTE — Plan of Care (Signed)
Patient alert and oriented x 4, denies pain with assess. Ambulates x 1 assist with front wheel walker. No issues with urine or bowel elimination. No adverse events with current cardiac monitoring, vitals stable, Sbp >100 during shift. No respiratory distress, remains on 2 liters with exertion during ambulating. Adequate nutrition and consumed 100% of ensure at HS. Will continue to monitor.  Problem: Education: Goal: Knowledge of General Education information will improve Description: Including pain rating scale, medication(s)/side effects and non-pharmacologic comfort measures Outcome: Progressing   Problem: Health Behavior/Discharge Planning: Goal: Ability to manage health-related needs will improve Outcome: Progressing   Problem: Clinical Measurements: Goal: Ability to maintain clinical measurements within normal limits will improve Outcome: Progressing Goal: Will remain free from infection Outcome: Progressing Goal: Diagnostic test results will improve Outcome: Progressing Goal: Respiratory complications will improve Outcome: Progressing Goal: Cardiovascular complication will be avoided Outcome: Progressing   Problem: Activity: Goal: Risk for activity intolerance will decrease Outcome: Progressing   Problem: Coping: Goal: Level of anxiety will decrease Outcome: Progressing   Problem: Elimination: Goal: Will not experience complications related to bowel motility Outcome: Progressing Goal: Will not experience complications related to urinary retention Outcome: Progressing   Problem: Pain Managment: Goal: General experience of comfort will improve Outcome: Progressing   Problem: Safety: Goal: Ability to remain free from injury will improve Outcome: Progressing   Problem: Skin Integrity: Goal: Risk for impaired skin integrity will decrease Outcome: Progressing

## 2020-12-27 NOTE — Progress Notes (Signed)
Occupational Therapy Treatment Patient Details Name: Andres Gutierrez MRN: [id number] DOB: 1930-09-18 Today's Date: 12/27/2020    History of present illness 87 year old white male with a history of combined systolic diastolic heart failure with an EF of 25%, hypertension, CKD stage IIIb, myelodysplastic syndrome, recurrent right pleural effusion, chronic atrial fibrillation not on anticoagulation due to history of prior GI bleed, BPH who presents to the ER today with worsening shortness of breath.  Daughter states the patient has been short of breath for last several weeks.  Patient recently discharged at the end of July for about of heart failure.  Patient recently had a packed red blood cell transfusion in hematology clinic due to a low hemoglobin.   OT comments  Pt awake/alert in bed upon OT arrival.  Agreeable to OT tx.  Transitioned supine to EOB modified indep, sit to stand with set up/supv using RW.  VS checked: EOB sitting BP 95/50, standing 68/44, returned to supine 103/57; 02 ranged between 87-100 on 1L 02 with vc for inhaling in through the nose, exhaling out through the mouth.  No ambulation this morning d/t unstable Bps.  Returned to supine and reinforced importance of AROM with ankle pumps, hand pumps, elbow flex/ext for increasing circulation.  Encouraged use of urinal for toileting and no OOB activity without staff supv d/t unstable Bps.  Pt able to return demo of AROM and verbalized understanding of safety recommendations.  Left pt with call light near/ all needs met. Continue to monitor BP and 02 sats with activity.     Follow Up Recommendations  Home health OT;Supervision - Intermittent    Equipment Recommendations  None recommended by OT    Recommendations for Other Services      Precautions / Restrictions Precautions Precautions: None Precaution Comments: pt with LLE surgical shoe (per chart podiatrist wants him to wear that shoe, he visits podiatrists every 2-4 weeks for tx  of calluses) Restrictions Weight Bearing Restrictions: No       Mobility Bed Mobility Overal bed mobility: Modified Independent Bed Mobility: Supine to Sit;Sit to Supine;Rolling Rolling: Modified independent (Device/Increase time)   Supine to sit: Modified independent (Device/Increase time) Sit to supine: Modified independent (Device/Increase time)     Patient Response: Cooperative  Transfers Overall transfer level: Needs assistance Equipment used: Rolling walker (2 wheeled) Transfers: Sit to/from Stand Sit to Stand: Supervision         General transfer comment: good safety awareness; did not amb d/t unstable BPs in standing    Balance Overall balance assessment: Needs assistance Sitting-balance support: No upper extremity supported;Feet supported Sitting balance-[name]: Good     Standing balance support: Single extremity supported;During functional activity Standing balance-[name]: Fair Standing balance comment: no LOB with UE support. has personal RW at home that he uses daily; close supv d/t intermittent dizziness in standing                           ADL either performed or assessed with clinical judgement   ADL Overall ADL's : Needs assistance/impaired                 Upper Body Dressing : Sitting;Set up Upper Body Dressing Details (indicate cue type and reason): EOB to don hospital gown                 Functional mobility during ADLs: Supervision/safety;Rolling walker General ADL Comments: set up/supv sit to stand from EOB.  Supv  d/t intermittent dizziness     Vision Baseline Vision/History: No visual deficits Patient Visual Report: No change from baseline                Cognition Arousal/Alertness: Awake/alert Behavior During Therapy: WFL for tasks assessed/performed Overall Cognitive Status: Within Functional Limits for tasks assessed                                 General Comments: very  pleasant and cooperative                    General Comments EOB sitting BP 95/50, standing 68/44, returned to supine 103/57; 02 ranged between 87-100 on 1L 02 with vc for inhaling in through the nose, exhaling out through the mouth    Pertinent Vitals/ Pain       Pain Assessment: No/denies pain Pain Score: 0-No pain                                                          Frequency  Min 1X/week        Progress Toward Goals  OT Goals(current goals can now be found in the care plan section)  Progress towards OT goals: Progressing toward goals  Acute Rehab OT Goals Patient Stated Goal: go home OT Goal Formulation: With patient Time For Goal Achievement: 01/09/21 Potential to Achieve Goals: Good  Plan Discharge plan needs to be updated;Frequency needs to be updated    Co-evaluation                 AM-PAC OT "6 Clicks" Daily Activity     Outcome Measure   Help from another person eating meals?: None Help from another person taking care of personal grooming?: None Help from another person toileting, which includes using toliet, bedpan, or urinal?: A Little Help from another person bathing (including washing, rinsing, drying)?: A Little Help from another person to put on and taking off regular upper body clothing?: None Help from another person to put on and taking off regular lower body clothing?: None 6 Click Score: 22    End of Session Equipment Utilized During Treatment: Rolling walker  OT Visit Diagnosis: Unsteadiness on feet (R26.81);Muscle weakness (generalized) (M62.81)   Activity Tolerance Patient tolerated treatment well;Other (comment) (functional mobility limited by unstable BPs)   Patient Left in bed;with call bell/phone within reach   Nurse Communication Mobility status;Other (comment) (no ambulation today d/t unstable BPs)        Time: 5625-6389 OT Time Calculation (min): 32 min  Charges: OT General  Charges $OT Visit: 1 Visit OT Treatments $Therapeutic Exercise: 23-37 mins  [name], MS, OTR/L    [name] 12/27/2020, 9:52 AM

## 2020-12-27 NOTE — Care Management Important Message (Signed)
Important Message  Patient Details  Name: Andres Gutierrez MRN: [id number] Date of Birth: 03-17-1931   Medicare Important Message Given:  Yes     [name] 12/27/2020, 3:01 PM

## 2020-12-27 NOTE — Progress Notes (Signed)
Physical Therapy Treatment Patient Details Name: Andres Gutierrez MRN: [id number] DOB: Nov 10, 1930 Today's Date: 12/27/2020    History of Present Illness 87 year old white male with a history of combined systolic diastolic heart failure with an EF of 25%, hypertension, CKD stage IIIb, myelodysplastic syndrome, recurrent right pleural effusion, chronic atrial fibrillation not on anticoagulation due to history of prior GI bleed, BPH who presents to the ER today with worsening shortness of breath.  Daughter states the patient has been short of breath for last several weeks.  Patient recently discharged at the end of July for about of heart failure.  Patient recently had a packed red blood cell transfusion in hematology clinic due to a low hemoglobin.    PT Comments    Pt received seated upright in bed agreeable to PT.  Pt on 1.5 L/min via [place] with O2 sats > 95% and HR in mid 80's. BP vitals assessed throughout session. See below fro progression. Due to low readings pt performed UE/LE AROM exercises with good recall from previous session with min VC's for form/technique with slight improvement in BP post exercise. Mod-I for bed mobility to sit EOB with further progression in LE exercises in prep for further decline in BP readings in gravity dependent position but pt asymptomatic. Supervision to stand to RW and side steps into recliner. Deferred further ambulation due to low BP readings throughout though they are improved from prior session. PT educated on importance of upright sitting and performing LE exercise to assist in blood circulation. Verbalized understanding. Pt left in recliner with all needs within reach with RN and NT present in room. D/c recs remain appropriate, pt is most limited by low BP readings at this time.   Supine: 93/57 mm Hg Supine s/p LE exercise: 105/55 mm Hg Standing: 98/58 mm Hg   Spo2 remained >95% throughout on 1.5 L/min and HR trending in mid 80's.    Follow Up  Recommendations  Home health PT;Supervision - Intermittent     Equipment Recommendations  None recommended by PT    Recommendations for Other Services       Precautions / Restrictions Precautions Precautions: None Precaution Comments: pt with LLE surgical shoe (per chart podiatrist wants him to wear that shoe, he visits podiatrists every 2-4 weeks for tx of calluses) Restrictions Weight Bearing Restrictions: No    Mobility  Bed Mobility Overal bed mobility: Modified Independent Bed Mobility: Supine to Sit Rolling: Modified independent (Device/Increase time)   Supine to sit: Modified independent (Device/Increase time) Sit to supine: Modified independent (Device/Increase time)   General bed mobility comments: Pt returned to seated in recliner    Transfers Overall transfer level: Needs assistance Equipment used: Rolling walker (2 wheeled) Transfers: Sit to/from Stand Sit to Stand: Supervision         General transfer comment: Remains steady with ability to stand without UE support. Did not ambulate due to low BP readings.  Ambulation/Gait             General Gait Details: Deferred due to low BP readings   Stairs             Wheelchair Mobility    Modified Rankin (Stroke Patients Only)       Balance Overall balance assessment: Needs assistance Sitting-balance support: No upper extremity supported;Feet supported Sitting balance-[name]: Good     Standing balance support: Single extremity supported;During functional activity Standing balance-[name]: Fair Standing balance comment: Able to stand with no UE support during BP  reading. NO sway or LOB                            Cognition Arousal/Alertness: Awake/alert Behavior During Therapy: WFL for tasks assessed/performed Overall Cognitive Status: Within Functional Limits for tasks assessed                                 General Comments: very pleasant and  cooperative      Exercises General Exercises - Lower Extremity Ankle Circles/Pumps: AROM;Both;10 reps;Supine Heel Slides: AROM;Supine;Both;10 reps Hip Flexion/Marching: AROM;Supine;Seated;Both;10 reps Toe Raises: AROM;Seated;Both;10 reps Heel Raises: AROM;Seated;Both;10 reps Other Exercises Other Exercises: B shoulder flexion for improving blood flow d/t low BP readings    General Comments General comments (skin integrity, edema, etc.): EOB sitting BP 95/50, standing 68/44, returned to supine 103/57; 02 ranged between 87-100 on 1L 02 with vc for inhaling in through the nose, exhaling out through the mouth      Pertinent Vitals/Pain Pain Assessment: No/denies pain Pain Score: 0-No pain    Home Living                      Prior Function            PT Goals (current goals can now be found in the care plan section) Acute Rehab PT Goals Patient Stated Goal: go home PT Goal Formulation: With patient Time For Goal Achievement: 01/09/21 Potential to Achieve Goals: Good Progress towards PT goals: Progressing toward goals    Frequency    Min 2X/week      PT Plan Current plan remains appropriate    Co-evaluation              AM-PAC PT "6 Clicks" Mobility   Outcome Measure  Help needed turning from your back to your side while in a flat bed without using bedrails?: None Help needed moving from lying on your back to sitting on the side of a flat bed without using bedrails?: None Help needed moving to and from a bed to a chair (including a wheelchair)?: A Little Help needed standing up from a chair using your arms (e.g., wheelchair or bedside chair)?: A Little Help needed to walk in hospital room?: A Little Help needed climbing 3-5 steps with a railing? : A Little 6 Click Score: 20    End of Session Equipment Utilized During Treatment: Gait belt;Oxygen Activity Tolerance: Patient tolerated treatment well;Treatment limited secondary to medical complications  (Comment) Patient left: in chair;with call bell/phone within reach;with nursing/sitter in room Nurse Communication: Mobility status PT Visit Diagnosis: Muscle weakness (generalized) (M62.81);Difficulty in walking, not elsewhere classified (R26.2)     Time: 0240-9735 PT Time Calculation (min) (ACUTE ONLY): 25 min  Charges:  $Therapeutic Exercise: 8-22 mins $Therapeutic Activity: 8-22 mins                    Gertrude Bucks M. Fairly IV, PT, DPT Physical Therapist- [facility]  12/27/2020, 11:42 AM

## 2020-12-27 NOTE — Progress Notes (Signed)
[facility] Cardiology    SUBJECTIVE: Still feels weak has some mild lightheadedness but in general feels better with reduced shortness of   Vitals:   12/27/20 0347 12/27/20 0752 12/27/20 1100 12/27/20 1613  BP: (!) 103/56 (!) 93/52 108/78 (!) 111/47  Pulse: 85 75 85 75  Resp: 20 17 19 18   Temp: (!) 97.5 F (36.4 C) (!) 96.9 F (36.1 C) (!) 95.9 F (35.5 C) (!) 95.9 F (35.5 C)  TempSrc:   Axillary   SpO2: 98% 95% 95% 94%  Weight: 75.1 kg     Height:         Intake/Output Summary (Last 24 hours) at 12/27/2020 1657 Last data filed at 12/27/2020 1330 Gross per 24 hour  Intake 1200 ml  Output 1650 ml  Net -450 ml      PHYSICAL EXAM  General: Well developed, well nourished, in no acute distress HEENT:  Normocephalic and atramatic Neck:  No JVD.  Lungs: Clear bilaterally to auscultation and percussion. Heart: HRRR . Normal S1 and S2 without gallops or murmurs.  Abdomen: Bowel sounds are positive, abdomen soft and non-tender  Msk:  Back normal, normal gait. Normal strength and tone for age. Extremities: No clubbing, cyanosis or edema.   Neuro: Alert and oriented X 3. Psych:  Good affect, responds appropriately   LABS: Basic Metabolic Panel: Recent Labs    12/26/20 0436 12/27/20 0425  NA 135 132*  K 3.9 4.6  CL 97* 95*  CO2 27 29  GLUCOSE 88 93  BUN 44* 48*  CREATININE 2.19* 2.28*  CALCIUM 9.0 9.0  MG 1.8 1.8   Liver Function Tests: No results for input(s): AST, ALT, ALKPHOS, BILITOT, PROT, ALBUMIN in the last 72 hours. No results for input(s): LIPASE, AMYLASE in the last 72 hours. CBC: Recent Labs    12/26/20 0436 12/27/20 0425  WBC 6.1 6.6  HGB 8.9* 9.7*  HCT 25.9* 28.5*  MCV 99.2 100.4*  PLT 157 144*   Cardiac Enzymes: No results for input(s): CKTOTAL, CKMB, CKMBINDEX, TROPONINI in the last 72 hours. BNP: Invalid input(s): POCBNP D-Dimer: No results for input(s): DDIMER in the last 72 hours. Hemoglobin A1C: No results for input(s): HGBA1C in the  last 72 hours. Fasting Lipid Panel: No results for input(s): CHOL, HDL, LDLCALC, TRIG, CHOLHDL, LDLDIRECT in the last 72 hours. Thyroid Function Tests: No results for input(s): TSH, T4TOTAL, T3FREE, THYROIDAB in the last 72 hours.  Invalid input(s): FREET3 Anemia Panel: Recent Labs    12/25/20 0529  VITAMINB12 1,875*  FOLATE 30.0    DG Chest Port 1 View  Result Date: 12/26/2020 CLINICAL DATA:  Shortness of breath. EXAM: PORTABLE CHEST 1 VIEW COMPARISON:  December 23, 2020. FINDINGS: Stable cardiomegaly. Status post coronary artery bypass graft. No pneumothorax is noted. Interval development of right midlung subsegmental atelectasis or infiltrate. Stable bibasilar atelectasis is noted with probable small right pleural effusion. Bony thorax is unremarkable. IMPRESSION: Stable bibasilar atelectasis with probable small right pleural effusion. Interval development of right midlung subsegmental atelectasis or infiltrate. Electronically Signed   By: Marijo Conception M.D.   On: 12/26/2020 08:28     Echo severely depressed overall left ventricular function less than 25%  TELEMETRY: Heart rate of 75:  ASSESSMENT AND PLAN:  Principal Problem:   Acute on chronic combined systolic (congestive) and diastolic (congestive) heart failure (HCC) Active Problems:   Chronic atrial fibrillation (HCC)   BPH (benign prostatic hyperplasia)   CKD (chronic kidney disease), stage IIIb   MDS (  myelodysplastic syndrome), low grade (HCC)   Recurrent right pleural effusion   DNR (do not resuscitate)/DNI(Do Not Intubate)   Acute combined systolic (congestive) and diastolic (congestive) heart failure (HCC)   CHF (congestive heart failure) (HCC)   Protein-calorie malnutrition, severe    Chronic systolic congestive heart failure with dyspnea somewhat improved Generalized weakness persistent but somewhat improved Atrial fibrillation appears to be reasonably controlled Renal insufficiency appears to be moderate but  stable Would recommend physical therapy and increased activity Consider discharge home soon    Yolonda Kida, MD 12/27/2020 4:57 PM

## 2020-12-28 ENCOUNTER — Telehealth: Payer: Self-pay | Admitting: Primary Care

## 2020-12-28 DIAGNOSIS — I5043 Acute on chronic combined systolic (congestive) and diastolic (congestive) heart failure: Secondary | ICD-10-CM

## 2020-12-28 LAB — CBC
HCT: 26.1 % — ABNORMAL LOW (ref 39.0–52.0)
Hemoglobin: 8.8 g/dL — ABNORMAL LOW (ref 13.0–17.0)
MCH: 33.5 pg (ref 26.0–34.0)
MCHC: 33.7 g/dL (ref 30.0–36.0)
MCV: 99.2 fL (ref 80.0–100.0)
Platelets: 156 10*3/uL (ref 150–400)
RBC: 2.63 MIL/uL — ABNORMAL LOW (ref 4.22–5.81)
RDW: 17.6 % — ABNORMAL HIGH (ref 11.5–15.5)
WBC: 7.6 10*3/uL (ref 4.0–10.5)
nRBC: 0 % (ref 0.0–0.2)

## 2020-12-28 LAB — MAGNESIUM: Magnesium: 1.7 mg/dL (ref 1.7–2.4)

## 2020-12-28 LAB — BASIC METABOLIC PANEL
Anion gap: 10 (ref 5–15)
BUN: 51 mg/dL — ABNORMAL HIGH (ref 8–23)
CO2: 28 mmol/L (ref 22–32)
Calcium: 8.9 mg/dL (ref 8.9–10.3)
Chloride: 94 mmol/L — ABNORMAL LOW (ref 98–111)
Creatinine, Ser: 2.17 mg/dL — ABNORMAL HIGH (ref 0.61–1.24)
GFR, Estimated: 29 mL/min — ABNORMAL LOW (ref >60)
Glucose, Bld: 91 mg/dL (ref 70–99)
Potassium: 4.3 mmol/L (ref 3.5–5.1)
Sodium: 132 mmol/L — ABNORMAL LOW (ref 135–145)

## 2020-12-28 NOTE — Progress Notes (Signed)
Unable to ambulate patient c/o feeling dizzy when standing. Patient has been on room air since 1250pm and no problems maintaining O2 SATs in the upper 90's no labored breathing or shortness of breath.  Marcell Chavarin, Tivis Ringer, RN

## 2020-12-28 NOTE — Progress Notes (Signed)
Occupational Therapy Treatment Patient Details Name: Andres Gutierrez MRN: [id number] DOB: [birth year] Today's Date: 12/28/2020    History of present illness 87 year old white male with a history of combined systolic diastolic heart failure with an EF of 25%, hypertension, CKD stage IIIb, myelodysplastic syndrome, recurrent right pleural effusion, chronic atrial fibrillation not on anticoagulation due to history of prior GI bleed, BPH who presents to the ER today with worsening shortness of breath.  Daughter states the patient has been short of breath for last several weeks.  Patient recently discharged at the end of July for about of heart failure.  Patient recently had a packed red blood cell transfusion in hematology clinic due to a low hemoglobin.   OT comments  Pt. resting in bed upon arrival, and declined to attempt OOB activity secondary to reports of having dizziness today. Pt.'s BP 93/56 while supine with HOB elevated. Pt. SO2 93% on RA.  Pt. education was provided about pt.'s home routines, energy conservation, and work simplification strategies for ADLs, and IADLs. An energy conservation handout was provided to the pt. Pt. Continues to benefit from OT services for ADL training, A/E training, and pt. Education energy conservation, work simplification techniques, home modification, and DME. Pt. Plans to return home upon discharge with family to assist pt. as needed. Pt. Could benefit from follow-up [place] services upon discharge.     Follow Up Recommendations  Home health OT;Supervision - Intermittent    Equipment Recommendations  None recommended by OT    Recommendations for Other Services      Precautions / Restrictions Precautions Precautions: Other (comment) Precaution Comments: pt with LLE surgical shoe (per chart podiatrist wants him to wear that shoe, he visits podiatrists every 2-4 weeks for tx of calluses) Restrictions Weight Bearing Restrictions: No       Mobility Bed  Mobility Overal bed mobility: Independent   Rolling: Modified independent (Device/Increase time)              Transfers Overall transfer level: Needs assistance               General transfer comment: Deferred secondary to Low BP and reports of dizziness.    Balance                                           ADL either performed or assessed with clinical judgement   ADL                                         General ADL Comments: Pt. education was provided about energy conservation, work simplification skills during ADLs, and IADLs     Vision Baseline Vision/History: No visual deficits Patient Visual Report: No change from baseline     Perception     Praxis      Cognition Arousal/Alertness: Awake/alert Behavior During Therapy: WFL for tasks assessed/performed Overall Cognitive Status: Within Functional Limits for tasks assessed                                          Exercises     Shoulder Instructions       General Comments      Pertinent Vitals/  Pain       Pain Assessment: No/denies pain  Home Living                                          Prior Functioning/Environment              Frequency  Min 1X/week        Progress Toward Goals  OT Goals(current goals can now be found in the care plan section)  Progress towards OT goals: Progressing toward goals  Acute Rehab OT Goals Patient Stated Goal: To go home OT Goal Formulation: With patient Time For Goal Achievement: 01/09/21 Potential to Achieve Goals: Good  Plan Discharge plan remains appropriate    Co-evaluation                 AM-PAC OT "6 Clicks" Daily Activity     Outcome Measure   Help from another person eating meals?: None Help from another person taking care of personal grooming?: None Help from another person toileting, which includes using toliet, bedpan, or urinal?: A Little Help  from another person bathing (including washing, rinsing, drying)?: A Little Help from another person to put on and taking off regular upper body clothing?: None Help from another person to put on and taking off regular lower body clothing?: None 6 Click Score: 22    End of Session    OT Visit Diagnosis: Muscle weakness (generalized) (M62.81)   Activity Tolerance Patient tolerated treatment well;Other (comment)   Patient Left in bed;with call bell/phone within reach   Nurse Communication          Time: 1547-1610 OT Time Calculation (min): 23 min  Charges: OT General Charges $OT Visit: 1 Visit OT Treatments $Self Care/Home Management : 23-37 mins  Harrel Carina, MS, OTR/L    Harrel Carina, MS, OTR/L 12/28/2020, 4:23 PM

## 2020-12-28 NOTE — Telephone Encounter (Signed)
T/c to daughter to make appt. Pt is inpatient again. He has CHF and daughter asks about hospice. I have asked team to follow up.

## 2020-12-28 NOTE — Progress Notes (Signed)
[facility] Liaison note:  This patient is currently enrolled in [facility] outpatient-based Palliative Care. Will continue to follow for disposition.  Please call with any outpatient palliative questions or concerns.  Thank you, Dee Curry, LPN [facility] Hospital Liaison [phone] 

## 2020-12-28 NOTE — Progress Notes (Addendum)
PROGRESS NOTE   HPI was taken from Dr. Bridgett Larsson: 87 year old white male with a history of combined systolic diastolic heart failure with an EF of 25%, hypertension, CKD stage IIIb, myelodysplastic syndrome, recurrent right pleural effusion, chronic atrial fibrillation not on anticoagulation due to history of prior GI bleed, BPH who presents to the ER today with worsening shortness of breath.  Daughter states the patient has been short of breath for last several weeks.  Patient recently discharged at the end of July for about of heart failure.  Patient recently had a packed red blood cell transfusion in hematology clinic due to a low hemoglobin.  This was 3 days ago.  Patient's had worsening shortness of breath since then.  He was seen in the cardiology office yesterday and had a dose of IV Lasix.  Patient states that he urinated about 1 to 2 L overnight however patient still symptomatically short of breath.  Patient recently seen by nephrology due to CKD stage IIIb.  No plans were made yet to discuss dialysis for him yet.  In the ER, chest x-ray demonstrated pulmonary edema.  Bedside thoracic ultrasound demonstrated right pleural effusion.  Laboratory evaluation showed serum creatinine of 2.36.  Hemoglobin is improved to 9.1 from a baseline of 7.63 days ago.  BNP was elevated to 1557.  This is increased from the end of July where it was 1091.   Due to acute on chronic systolic heart failure, [facility] hospitalist contacted for admission.    ED Course: cxr shows pulmonary edema, right pleural effusion. Started on IV lasix.   Hospital course from Dr. Jimmye Norman 8/13-8/16/22: Pt presented w/ shortness of breath likely secondary to CHF exacerbation. Repeat echo shows EF <94%, normal diastolic function. Pt has been treated w/ IV lasix, coreg, digoxin as per cardio. Of note, pt has hx of MDS and is likely transfusion dependent but has not required a transfusion since admission.    Andres Gutierrez  WNI:[id number]  DOB: 1930-06-22 DOA: 12/23/2020 PCP: Andres Aus, MD   Assessment & Plan:   Principal Problem:   Acute on chronic combined systolic (congestive) and diastolic (congestive) heart failure (HCC) Active Problems:   Chronic atrial fibrillation (HCC)   BPH (benign prostatic hyperplasia)   CKD (chronic kidney disease), stage IIIb   MDS (myelodysplastic syndrome), low grade (HCC)   Recurrent right pleural effusion   DNR (do not resuscitate)/DNI(Do Not Intubate)   Acute combined systolic (congestive) and diastolic (congestive) heart failure (HCC)   CHF (congestive heart failure) (HCC)   Protein-calorie malnutrition, severe  Acute on chronic combined CHF exacerbation: echo showed EF <38%, normal diastolic function. Hypotensive today, will hold coreg. Cont digoxin. Cr and bun are uptrending, suspect cardiorenal. Will hold lasix today. Continue milrinone. Is followed by palliative as an outpatient, will ask them to see here.  MDS: chronic. S/p pRBC transfusion 3 days prior to admission to the hospital. Likely etiology on anemia & thrombocytopenia. B12, folate are WNL. Plan on outpatient hematology f/u  Recurrent right pleural effusion: repeat CXR still shows small pleural effusion unlikely amenable thoracentesis at this time.   Acute hypoxic respiratory failure: 2/2 chf. Pt has discontinued O2, will have nursing check o2 in a little while  CKDIIIb: cr up-trended to 2.17 today, suspect cardiorenal. Pausing lasix  Chronic a. fib: continue on digoxin, coreg. Not on anticoagulation secondary to hx of GI bleeding  GI bleed: recent admission for GIB, EGD showing AVM. Here hgb stable, no report of melena/hematochezia.  BPH: continue  on home dose of tamsulosin   Hypomagnesemia: WNL   Severe protein-calorie malnutrition: continue on nutritional supplements   DVT prophylaxis: SCDs Code Status: DNR Family Communication: daughter updated telephonically 8/17 Disposition Plan: PT/OT recs HH   Level  of care: Med-Surg  Status is: Inpatient  Remains inpatient appropriate because:Unsafe d/c plan, IV treatments appropriate due to intensity of illness or inability to take PO, and Inpatient level of care appropriate due to severity of illness,   Dispo: The patient is from: Home              Anticipated d/c is to: tbd. Pt/ot advising home w/ home health, daughter doesn't think safe for discharge home.               Patient currently is not medically stable to d/c.   Difficult to place patient unclear   Consultants:  cardio  Procedures:  Antimicrobials:   Subjective: Feeling well, has appetite, breathing improved, swelling improved.  Objective: Vitals:   12/27/20 1954 12/28/20 0434 12/28/20 0857 12/28/20 1313  BP: 102/62 (!) 107/55 (!) 101/52   Pulse: 81 81 75 72  Resp: 18 20 20    Temp: (!) 97.5 F (36.4 C) 97.8 F (36.6 C) 97.6 F (36.4 C)   TempSrc: Oral Oral Oral   SpO2: 100% 97% 97% 95%  Weight:  75.4 kg    Height:        Intake/Output Summary (Last 24 hours) at 12/28/2020 1330 Last data filed at 12/28/2020 0100 Gross per 24 hour  Intake 560 ml  Output 350 ml  Net 210 ml   Filed Weights   12/26/20 0529 12/27/20 0347 12/28/20 0434  Weight: 78.5 kg 75.1 kg 75.4 kg    Examination:  General exam: Appears comfortable Respiratory system: decreased breath sounds b/l Cardiovascular system: S1/S2+. No rubs or clicks   Gastrointestinal system: Abd is soft, NT, ND & hypoactive bowel sounds  Central nervous system: Alert and oriented. Moves all extremities  Psychiatry: Judgement and insight appear poor. Flat mood and affect    Data Reviewed: I have personally reviewed following labs and imaging studies  CBC: Recent Labs  Lab 12/23/20 0925 12/24/20 0411 12/25/20 0529 12/26/20 0436 12/27/20 0425 12/28/20 0450  WBC 6.2 7.3 5.9 6.1 6.6 7.6  NEUTROABS 2.3 3.0  --   --   --   --   HGB 9.1* 8.7* 9.2* 8.9* 9.7* 8.8*  HCT 28.1* 26.2* 28.4* 25.9* 28.5* 26.1*  MCV  101.4* 100.4* 100.0 99.2 100.4* 99.2  PLT 154 143* 144* 157 144* 122   Basic Metabolic Panel: Recent Labs  Lab 12/24/20 0411 12/25/20 0529 12/26/20 0436 12/27/20 0425 12/28/20 0450  NA 136 136 135 132* 132*  K 4.3 4.2 3.9 4.6 4.3  CL 98 98 97* 95* 94*  CO2 26 27 27 29 28   GLUCOSE 91 104* 88 93 91  BUN 44* 45* 44* 48* 51*  CREATININE 2.40* 2.36* 2.19* 2.28* 2.17*  CALCIUM 9.1 9.3 9.0 9.0 8.9  MG 1.6* 1.9 1.8 1.8 1.7   GFR: Estimated Creatinine Clearance: 25.6 mL/min (A) (by C-G formula based on SCr of 2.17 mg/dL (H)). Liver Function Tests: Recent Labs  Lab 12/23/20 0925 12/24/20 0411  AST 24 21  ALT 14 14  ALKPHOS 68 68  BILITOT 1.5* 1.4*  PROT 7.2 6.7  ALBUMIN 4.3 3.9   Recent Labs  Lab 12/23/20 0925  LIPASE 46   No results for input(s): AMMONIA in the last 168 hours. Coagulation Profile:  No results for input(s): INR, PROTIME in the last 168 hours. Cardiac Enzymes: No results for input(s): CKTOTAL, CKMB, CKMBINDEX, TROPONINI in the last 168 hours. BNP (last 3 results) No results for input(s): PROBNP in the last 8760 hours. HbA1C: No results for input(s): HGBA1C in the last 72 hours. CBG: No results for input(s): GLUCAP in the last 168 hours. Lipid Profile: No results for input(s): CHOL, HDL, LDLCALC, TRIG, CHOLHDL, LDLDIRECT in the last 72 hours. Thyroid Function Tests: No results for input(s): TSH, T4TOTAL, FREET4, T3FREE, THYROIDAB in the last 72 hours. Anemia Panel: No results for input(s): VITAMINB12, FOLATE, FERRITIN, TIBC, IRON, RETICCTPCT in the last 72 hours.  Sepsis Labs: No results for input(s): PROCALCITON, LATICACIDVEN in the last 168 hours.  Recent Results (from the past 240 hour(s))  Resp Panel by RT-PCR (Flu A&B, Covid) Nasopharyngeal Swab     Status: None   Collection Time: 12/23/20 11:02 AM   Specimen: Nasopharyngeal Swab; Nasopharyngeal(NP) swabs in vial transport medium  Result Value Ref Range Status   SARS Coronavirus 2 by RT PCR  NEGATIVE NEGATIVE Final    Comment: (NOTE) SARS-CoV-2 target nucleic acids are NOT DETECTED.  The SARS-CoV-2 RNA is generally detectable in upper respiratory specimens during the acute phase of infection. The lowest concentration of SARS-CoV-2 viral copies this assay can detect is 138 copies/mL. A negative result does not preclude SARS-Cov-2 infection and should not be used as the sole basis for treatment or other patient management decisions. A negative result may occur with  improper specimen collection/handling, submission of specimen other than nasopharyngeal swab, presence of viral mutation(s) within the areas targeted by this assay, and inadequate number of viral copies(<138 copies/mL). A negative result must be combined with clinical observations, patient history, and epidemiological information. The expected result is Negative.  Fact Sheet for Patients:  EntrepreneurPulse.com.au  Fact Sheet for Healthcare Providers:  IncredibleEmployment.be  This test is no t yet approved or cleared by the [place] FDA and  has been authorized for detection and/or diagnosis of SARS-CoV-2 by FDA under an Emergency Use Authorization (EUA). This EUA will remain  in effect (meaning this test can be used) for the duration of the COVID-19 declaration under Section 564(b)(1) of the Act, 21 U.S.C.section 360bbb-3(b)(1), unless the authorization is terminated  or revoked sooner.       Influenza A by PCR NEGATIVE NEGATIVE Final   Influenza B by PCR NEGATIVE NEGATIVE Final    Comment: (NOTE) The Xpert Xpress SARS-CoV-2/FLU/RSV plus assay is intended as an aid in the diagnosis of influenza from Nasopharyngeal swab specimens and should not be used as a sole basis for treatment. Nasal washings and aspirates are unacceptable for Xpert Xpress SARS-CoV-2/FLU/RSV testing.  Fact Sheet for Patients: EntrepreneurPulse.com.au  Fact Sheet for  Healthcare Providers: IncredibleEmployment.be  This test is not yet approved or cleared by the [place] FDA and has been authorized for detection and/or diagnosis of SARS-CoV-2 by FDA under an Emergency Use Authorization (EUA). This EUA will remain in effect (meaning this test can be used) for the duration of the COVID-19 declaration under Section 564(b)(1) of the Act, 21 U.S.C. section 360bbb-3(b)(1), unless the authorization is terminated or revoked.  Performed at [facility], [address] [place], [place] [address]          Radiology Studies: No results found.      Scheduled Meds:  atorvastatin  40 mg Oral Q supper   carvedilol  3.125 mg Oral BID WC   digoxin  0.0625 mg Oral Daily   famotidine  10 mg Oral QODAY   feeding supplement  237 mL Oral TID BM   furosemide  40 mg Intravenous BID   midodrine  10 mg Oral TID WC   multivitamin with minerals  1 tablet Oral Daily   pantoprazole  40 mg Oral BID   ranolazine  500 mg Oral BID   sucralfate  1 g Oral BID   tamsulosin  0.4 mg Oral QPC supper   Continuous Infusions:   LOS: 4 days    Time spent: 35 mins     Desma Maxim, MD [facility]  If 7PM-7AM, please contact night-coverage 12/28/2020, 1:30 PM

## 2020-12-28 NOTE — Progress Notes (Signed)
   Heart failure Note  Called and spoke with patient's daughter [name] to see if we could find some low sodium options to what her dad likes to eat, like lower sodium soups.  She states that her dad has lost a lot of weight here lately and she has not going to take away his sausage biscuit.  I told her that I understood that but if we could look for other options that were lower in sodium  intake that would help in reducing his hospitalizations.  She voices understanding.  Pricilla Riffle RN [facility]

## 2020-12-28 NOTE — Plan of Care (Signed)
  Problem: Education: Goal: Knowledge of General Education information will improve Description Including pain rating scale, medication(s)/side effects and non-pharmacologic comfort measures Outcome: Progressing   Problem: Health Behavior/Discharge Planning: Goal: Ability to manage health-related needs will improve Outcome: Progressing   

## 2020-12-28 NOTE — Progress Notes (Signed)
[facility] Cardiology    SUBJECTIVE: Patient denies any significant chest pain improved shortness of breath.  Denies any blackout spells or syncope mild lightheadedness but shortness of breath improved   Vitals:   12/28/20 0434 12/28/20 0857 12/28/20 1313 12/28/20 1356  BP: (!) 107/55 (!) 101/52  (!) 94/53  Pulse: 81 75 72 77  Resp: 20 20    Temp: 97.8 F (36.6 C) 97.6 F (36.4 C)    TempSrc: Oral Oral    SpO2: 97% 97% 95% 96%  Weight: 75.4 kg     Height:         Intake/Output Summary (Last 24 hours) at 12/28/2020 1402 Last data filed at 12/28/2020 0100 Gross per 24 hour  Intake 560 ml  Output 350 ml  Net 210 ml      PHYSICAL EXAM  General: Well developed, well nourished, in no acute distress HEENT:  Normocephalic and atramatic Neck:  No JVD.  Lungs: Clear bilaterally to auscultation and percussion. Heart: HRRR . Normal S1 and S2 without gallops or murmurs.  Abdomen: Bowel sounds are positive, abdomen soft and non-tender  Msk:  Back normal, normal gait. Normal strength and tone for age. Extremities: No clubbing, cyanosis or edema.   Neuro: Alert and oriented X 3. Psych:  Good affect, responds appropriately   LABS: Basic Metabolic Panel: Recent Labs    12/27/20 0425 12/28/20 0450  NA 132* 132*  K 4.6 4.3  CL 95* 94*  CO2 29 28  GLUCOSE 93 91  BUN 48* 51*  CREATININE 2.28* 2.17*  CALCIUM 9.0 8.9  MG 1.8 1.7   Liver Function Tests: No results for input(s): AST, ALT, ALKPHOS, BILITOT, PROT, ALBUMIN in the last 72 hours. No results for input(s): LIPASE, AMYLASE in the last 72 hours. CBC: Recent Labs    12/27/20 0425 12/28/20 0450  WBC 6.6 7.6  HGB 9.7* 8.8*  HCT 28.5* 26.1*  MCV 100.4* 99.2  PLT 144* 156   Cardiac Enzymes: No results for input(s): CKTOTAL, CKMB, CKMBINDEX, TROPONINI in the last 72 hours. BNP: Invalid input(s): POCBNP Gutierrez-Dimer: No results for input(s): DDIMER in the last 72 hours. Hemoglobin A1C: No results for input(s): HGBA1C in the  last 72 hours. Fasting Lipid Panel: No results for input(s): CHOL, HDL, LDLCALC, TRIG, CHOLHDL, LDLDIRECT in the last 72 hours. Thyroid Function Tests: No results for input(s): TSH, T4TOTAL, T3FREE, THYROIDAB in the last 72 hours.  Invalid input(s): FREET3 Anemia Panel: No results for input(s): VITAMINB12, FOLATE, FERRITIN, TIBC, IRON, RETICCTPCT in the last 72 hours.  No results found.   Echo severely depressed overall left ventricular function EF less than 25%  TELEMETRY: Rate of 70:  ASSESSMENT AND PLAN:  Principal Problem:   Acute on chronic combined systolic (congestive) and diastolic (congestive) heart failure (HCC) Active Problems:   Chronic atrial fibrillation (HCC)   BPH (benign prostatic hyperplasia)   CKD (chronic kidney disease), stage IIIb   MDS (myelodysplastic syndrome), low grade (HCC)   Recurrent right pleural effusion   DNR (do not resuscitate)/DNI(Do Not Intubate)   Acute combined systolic (congestive) and diastolic (congestive) heart failure (HCC)   CHF (congestive heart failure) (HCC)   Protein-calorie malnutrition, severe    Plan Increase activity still weak improved shortness of breath Acute on chronic renal sufficiency stage III Recommend follow-up as outpatient with nephrology Increase activity consider physical therapy Continue current medications Lightheaded dizzy probably related to mild hypertension but somewhat better now Denies any dyspnea sitting at bedside   Andres Gutierrez  Andres Westhoff, MD 12/28/2020 2:02 PM

## 2020-12-29 DIAGNOSIS — I5043 Acute on chronic combined systolic (congestive) and diastolic (congestive) heart failure: Secondary | ICD-10-CM | POA: Diagnosis not present

## 2020-12-29 DIAGNOSIS — Z7189 Other specified counseling: Secondary | ICD-10-CM

## 2020-12-29 LAB — BASIC METABOLIC PANEL
Anion gap: 13 (ref 5–15)
BUN: 55 mg/dL — ABNORMAL HIGH (ref 8–23)
CO2: 27 mmol/L (ref 22–32)
Calcium: 9 mg/dL (ref 8.9–10.3)
Chloride: 90 mmol/L — ABNORMAL LOW (ref 98–111)
Creatinine, Ser: 2.19 mg/dL — ABNORMAL HIGH (ref 0.61–1.24)
GFR, Estimated: 28 mL/min — ABNORMAL LOW (ref >60)
Glucose, Bld: 98 mg/dL (ref 70–99)
Potassium: 4.5 mmol/L (ref 3.5–5.1)
Sodium: 130 mmol/L — ABNORMAL LOW (ref 135–145)

## 2020-12-29 LAB — CBC
HCT: 26.6 % — ABNORMAL LOW (ref 39.0–52.0)
Hemoglobin: 8.9 g/dL — ABNORMAL LOW (ref 13.0–17.0)
MCH: 32.7 pg (ref 26.0–34.0)
MCHC: 33.5 g/dL (ref 30.0–36.0)
MCV: 97.8 fL (ref 80.0–100.0)
Platelets: 146 10*3/uL — ABNORMAL LOW (ref 150–400)
RBC: 2.72 MIL/uL — ABNORMAL LOW (ref 4.22–5.81)
RDW: 17.7 % — ABNORMAL HIGH (ref 11.5–15.5)
WBC: 11.2 10*3/uL — ABNORMAL HIGH (ref 4.0–10.5)
nRBC: 0 % (ref 0.0–0.2)

## 2020-12-29 NOTE — Progress Notes (Signed)
[facility]) Hospital Liaison RN Note  Received request from [place], [place] for family interest in [place]. Met with patient and spoke with daughter, [name] over the phone to confirm interest and explain services. Chart and patient information under review by [facility] physician. Hospice Home eligibility pending at this time.   Family and Pricilla Riffle, LCSW [facility] Manager aware hospital liaison will follow up once decision is made regarding eligibility.   Please do not hesitate to call with any hospice related questions.   Thank you for the opportunity to participate in this patient's care.   Bobbie "Loren Racer, RN, BSN [facility] Liaison [phone]

## 2020-12-29 NOTE — Consult Note (Signed)
Consultation Note Date: 12/29/2020   Patient Name: Andres Gutierrez  DOB: 1930-09-03  MRN: [id number]  Age / Sex: 87 y.o., male  PCP: Rusty Aus, MD Referring Physician: Gwynne Edinger, MD  Reason for Consultation: Establishing goals of care  HPI/Patient Profile: 87 year old white male with a history of combined systolic diastolic heart failure with an EF of 25%, hypertension, CKD stage IIIb, myelodysplastic syndrome, recurrent right pleural effusion, chronic atrial fibrillation not on anticoagulation due to history of prior GI bleed, BPH who presents to the ER today with worsening shortness of breath.  Daughter states the patient has been short of breath for last several weeks.  Patient recently discharged at the end of July for heart failure.  Clinical Assessment and Goals of Care: Patient is sitting in bedside chair. He states he is widowed with 2 children. He states his daughter brings food for him and a cleaning service comes in to clean the home.   He states he has been SOB, and states usually he feels better pretty quickly but has not this time.    We discussed his diagnoses, prognosis, GOC, EOL wishes disposition and options.  Created space and opportunity for patient  to explore thoughts and feelings regarding current medical information.   A detailed discussion was had today regarding advanced directives.  Concepts specific to code status, artifical feeding and hydration, IV antibiotics and rehospitalization were discussed.  The difference between an aggressive medical intervention path and a comfort care path was discussed.  Values and goals of care important to patient and family were attempted to be elicited.  Discussed limitations of medical interventions to prolong quality of life in some situations and discussed the concept of human mortality.  He states he does not want CPR, to be placed  on a ventilator, to return to the hospital, or have life prolonging care. He states he has discussed hospice care in the past as he is currently a palliative patient. He states he would like hospice care. He states he is ready to die.  Called to speak with daughter. She states he has had a sharp decline, and she can see changes in him now just from one day to the next. She understands his HF has progressed to an EF <20%, when it was 25-30% a few months ago. She understands how this affects his energy levels and breathing.  states she does not want him to suffer and would like the hospice facility. Hospice liaison made aware.    SUMMARY OF RECOMMENDATIONS   Daughter would like hospice facility. Patient is amenable.  < 2 weeks if made comfort care. EF has decreased from 25-30% in May to < 20% this month. Recurrent pleural effusion. CKD.  Frequently recurrent admissions.     Primary Diagnoses: Present on Admission:  Chronic atrial fibrillation (HCC)  MDS (myelodysplastic syndrome), low grade (HCC)  Acute on chronic combined systolic (congestive) and diastolic (congestive) heart failure (HCC)  CKD (chronic kidney disease), stage IIIb  BPH (benign prostatic hyperplasia)  Acute combined systolic (congestive) and diastolic (congestive) heart failure ([place])   I have reviewed the medical record, interviewed the patient and family, and examined the patient. The following aspects are pertinent.  Past Medical History:  Diagnosis Date   Acalculous cholecystitis 10/29/2015   Acute on chronic heart failure ([place]) 06/07/2019   Anemia    Anginal pain (HCC)    Anxiety    Atrial fibrillation with RVR ([place]) 12/23/2015   Cardiomyopathy ([place])    Cerebral amyloid angiopathy (HCC)    CHF (congestive heart failure) (HCC)    Chronic kidney disease    kidney stones   Coronary artery disease    Cough    Dyspnea    GERD (gastroesophageal reflux disease)    GERD (gastroesophageal reflux disease) 12/23/2015    History of kidney stones    Hypertension    Lymphadenopathy, hilar    MDS (myelodysplastic syndrome) (HCC)    MI (myocardial infarction) ([place])    x 2   Wheezing    Social History   Socioeconomic History   Marital status: Widowed    Spouse name: Not on file   Number of children: 2   Years of education: college   Highest education level: Some college, no degree  Occupational History   Occupation: retired  Tobacco Use   Smoking status: Former   Smokeless tobacco: Current    Types: Nurse, children's Use: Never used  Substance and Sexual Activity   Alcohol use: Yes    Comment: occational almost rare once a year   Drug use: No   Sexual activity: Not Currently    Birth control/protection: Abstinence  Other Topics Concern   Not on file  Social History Narrative   Not on file   Social Determinants of Health   Financial Resource Strain: Not on file  Food Insecurity: Not on file  Transportation Needs: Not on file  Physical Activity: Not on file  Stress: Not on file  Social Connections: Not on file   Family History  Problem Relation Age of Onset   CAD Mother    Colon cancer Father    Scheduled Meds:  atorvastatin  40 mg Oral Q supper   digoxin  0.0625 mg Oral Daily   famotidine  10 mg Oral QODAY   feeding supplement  237 mL Oral TID BM   midodrine  10 mg Oral TID WC   multivitamin with minerals  1 tablet Oral Daily   pantoprazole  40 mg Oral BID   ranolazine  500 mg Oral BID   sucralfate  1 g Oral BID   tamsulosin  0.4 mg Oral QPC supper   Continuous Infusions: PRN Meds:.acetaminophen **OR** acetaminophen Medications Prior to Admission:  Prior to Admission medications   Medication Sig Start Date End Date Taking? Authorizing Provider  Ascorbic Acid (VITAMIN C PO) Take 1 tablet by mouth daily with breakfast.   Yes [provider]  atorvastatin (LIPITOR) 40 MG tablet Take 1 tablet (40 mg total) by mouth daily with supper. 02/27/20  Yes Enzo Bi,  MD  carvedilol (COREG) 3.125 MG tablet Take 1 tablet (3.125 mg total) by mouth 2 (two) times daily with a meal. 12/07/20  Yes Fritzi Mandes, MD  digoxin (LANOXIN) 0.125 MG tablet Take 0.0625 mg by mouth daily.   Yes [provider]  famotidine (PEPCID) 20 MG tablet Take 20 mg by mouth at bedtime. 02/04/19  Yes [provider]  midodrine (PROAMATINE) 10 MG tablet Take  1 tablet (10 mg total) by mouth 3 (three) times daily with meals. 12/07/20  Yes Fritzi Mandes, MD  Multiple Vitamin (MULTIVITAMIN) tablet Take 1 tablet by mouth daily.   Yes [provider]  pantoprazole (PROTONIX) 40 MG tablet Take 1 tablet (40 mg total) by mouth 2 (two) times daily. 08/17/20  Yes Lorella Nimrod, MD  ranolazine (RANEXA) 1000 MG SR tablet Take 500 mg by mouth 2 (two) times daily.  01/16/19  Yes [provider]  sucralfate (CARAFATE) 1 G tablet Take 1 g by mouth 2 (two) times daily.    Yes [provider]  tamsulosin (FLOMAX) 0.4 MG CAPS capsule Take 0.4 mg by mouth daily after supper.    Yes [provider]  torsemide (DEMADEX) 20 MG tablet Take 1 tablet (20 mg total) by mouth daily. 12/08/20  Yes Fritzi Mandes, MD  vitamin B-12 (CYANOCOBALAMIN) 500 MCG tablet Take 500 mcg by mouth daily.   Yes [provider]  acetaminophen (TYLENOL) 500 MG tablet Take 500 mg by mouth 2 (two) times daily as needed for mild pain or moderate pain.    [provider]  ferrous sulfate 325 (65 FE) MG tablet Take 1 tablet (325 mg total) by mouth every Monday, Wednesday, and Friday. With supper 02/29/20   Enzo Bi, MD  polyethylene glycol (MIRALAX / GLYCOLAX) 17 g packet Take 17 g by mouth daily as needed for mild constipation. Patient not taking: Reported on 12/23/2020 08/17/20   Lorella Nimrod, MD   Allergies  Allergen Reactions   Levaquin [Levofloxacin] Other (See Comments)    [name], her daughter, said Levaquin should be avoided because when patient had Levaquin a few years ago, he  had "a bad reaction".  She said he could not walk and could not lift up his feet.   Escitalopram Other (See Comments)    Reaction:  Makes him feel faint, like he was going to have a heart attack.   5ht3 Receptor Antagonists Other (See Comments)   Diltiazem Rash   Diphenhydramine Hcl Other (See Comments)    Reaction:  Rash and fever a long time ago, but has had it since with no problem.   Maxidex [Dexamethasone] Other (See Comments)    Reaction:  Unknown    Prednisone Other (See Comments)    Pt states that this medication made him feel crazy.     Serotonin Other (See Comments)    Tried 2 different types, lexapro and another one.  Reaction:  Made him feel like he was having a heart attack.    Vytorin [Ezetimibe-Simvastatin] Other (See Comments)    Reaction:  Unknown    Zocor [Simvastatin] Other (See Comments)    Reaction:  Unknown    Review of Systems  Constitutional:  Positive for fatigue.  Respiratory:  Positive for shortness of breath.    Physical Exam Pulmonary:     Effort: Pulmonary effort is normal.  Neurological:     Mental Status: He is alert.    Vital Signs: BP (!) 110/52 (BP Location: Left Arm)   Pulse 80   Temp 98.4 F (36.9 C)   Resp 18   Ht 6\' 4"  (1.93 m)   Wt 75.8 kg   SpO2 90%   BMI 20.34 kg/m  Pain Scale: 0-10   Pain Score: 0-No pain   SpO2: SpO2: 90 % O2 Device:SpO2: 90 % O2 Flow Rate: .O2 Flow Rate (L/min): 1 L/min  IO: Intake/output summary:  Intake/Output Summary (Last 24 hours) at 12/29/2020  [place] filed at 12/29/2020 0950 Gross per 24 hour  Intake 1080 ml  Output 250 ml  Net 830 ml    LBM: Last BM Date: 12/28/20 Baseline Weight: Weight: 80.3 kg Most recent weight: Weight: 75.8 kg        Time In: 2:30 Time Out: 3:40 Time Total: 70 min Greater than 50%  of this time was spent counseling and coordinating care related to the above assessment and plan.  Signed by: Asencion Gowda, NP   Please contact Palliative Medicine Team  phone at [phone] for questions and concerns.  For individual provider: See Shea Evans

## 2020-12-29 NOTE — Progress Notes (Addendum)
PROGRESS NOTE   HPI was taken from Dr. Bridgett Larsson: 87 year old white male with a history of combined systolic diastolic heart failure with an EF of 25%, hypertension, CKD stage IIIb, myelodysplastic syndrome, recurrent right pleural effusion, chronic atrial fibrillation not on anticoagulation due to history of prior GI bleed, BPH who presents to the ER today with worsening shortness of breath.  Daughter states the patient has been short of breath for last several weeks.  Patient recently discharged at the end of July for about of heart failure.  Patient recently had a packed red blood cell transfusion in hematology clinic due to a low hemoglobin.  This was 3 days ago.  Patient's had worsening shortness of breath since then.  He was seen in the cardiology office yesterday and had a dose of IV Lasix.  Patient states that he urinated about 1 to 2 L overnight however patient still symptomatically short of breath.  Patient recently seen by nephrology due to CKD stage IIIb.  No plans were made yet to discuss dialysis for him yet.    KOBE OFALLON  KGM:[id number] DOB: [birth year] DOA: 12/23/2020 PCP: Rusty Aus, MD   Assessment & Plan:   Principal Problem:   Acute on chronic combined systolic (congestive) and diastolic (congestive) heart failure (HCC) Active Problems:   Chronic atrial fibrillation (HCC)   BPH (benign prostatic hyperplasia)   CKD (chronic kidney disease), stage IIIb   MDS (myelodysplastic syndrome), low grade (HCC)   Recurrent right pleural effusion   DNR (do not resuscitate)/DNI(Do Not Intubate)   Acute combined systolic (congestive) and diastolic (congestive) heart failure (HCC)   CHF (congestive heart failure) (HCC)   Protein-calorie malnutrition, severe  Acute on chronic combined CHF exacerbation: echo showed EF <64%, normal diastolic function. Coreg held 2/2 hypotension. Continuing digoxin. Suspect cardiorenal. Milrinone continuing. Lasix has also been held. Palliative to see  today  Deconditioning: pt/ot advising home health. Daughter does not think safe to discharge home. SW exploring options with daughter  MDS: chronic. S/p pRBC transfusion 3 days prior to admission to the hospital. Likely etiology on anemia & thrombocytopenia. B12, folate are WNL. Plan on outpatient hematology f/u  Recurrent right pleural effusion: repeat CXR still shows small pleural effusion unlikely amenable thoracentesis at this time.   Acute hypoxic respiratory failure: 2/2 chf. Resolved.  CKDIIIb w/ aki vs progression to ckd4. Cr steady at low 2s, 2.19 today. BUN has up-trended. Lasix on pause.   Chronic a. fib: continue on digoxin. BB held. Not on anticoagulation secondary to hx of GI bleeding  GI bleed: recent admission for GIB, EGD showing AVM. Here hgb stable, no report of melena/hematochezia.  BPH: continue on home dose of tamsulosin   Hypomagnesemia: WNL   Severe protein-calorie malnutrition: continue on nutritional supplements   DVT prophylaxis: SCDs Code Status: DNR Family Communication: daughter updated telephonically 8/18 Disposition Plan: PT/OT recs HH vs long-term care  Level of care: Med-Surg  Status is: Inpatient  Remains inpatient appropriate because:Unsafe d/c plan, IV treatments appropriate due to intensity of illness or inability to take PO, and Inpatient level of care appropriate due to severity of illness,   Dispo: The patient is from: Home              Anticipated d/c is to: tbd. Pt/ot advising home w/ home health, daughter doesn't think safe for discharge home.               Patient currently is not medically stable to d/c.  Difficult to place patient unclear   Consultants:  cardio  Procedures:  Antimicrobials:   Subjective: Feeling well, has appetite, breathing improved, but does feel run down  Objective: Vitals:   12/28/20 1947 12/29/20 0402 12/29/20 0806 12/29/20 0913  BP: (!) 107/56 105/61 98/75   Pulse: 73 76 92   Resp: 20 (!) 21  18   Temp: 98.2 F (36.8 C) 98.4 F (36.9 C) 97.8 F (36.6 C)   TempSrc: Oral Oral    SpO2: 94% 92% 96% 93%  Weight:  75.8 kg    Height:        Intake/Output Summary (Last 24 hours) at 12/29/2020 1044 Last data filed at 12/29/2020 0950 Gross per 24 hour  Intake 1080 ml  Output 650 ml  Net 430 ml   Filed Weights   12/27/20 0347 12/28/20 0434 12/29/20 0402  Weight: 75.1 kg 75.4 kg 75.8 kg    Examination:  General exam: Appears comfortable Respiratory system: decreased breath sounds b/l, scattered rhonchi, no wheeze Cardiovascular system: S1/S2+. No rubs or clicks   Gastrointestinal system: Abd is soft, NT, ND & hypoactive bowel sounds  Central nervous system: Alert and oriented. Moves all extremities  Psychiatry: Judgement and insight appear poor. Flat mood and affect    Data Reviewed: I have personally reviewed following labs and imaging studies  CBC: Recent Labs  Lab 12/23/20 0925 12/24/20 0411 12/25/20 0529 12/26/20 0436 12/27/20 0425 12/28/20 0450 12/29/20 0339  WBC 6.2 7.3 5.9 6.1 6.6 7.6 11.2*  NEUTROABS 2.3 3.0  --   --   --   --   --   HGB 9.1* 8.7* 9.2* 8.9* 9.7* 8.8* 8.9*  HCT 28.1* 26.2* 28.4* 25.9* 28.5* 26.1* 26.6*  MCV 101.4* 100.4* 100.0 99.2 100.4* 99.2 97.8  PLT 154 143* 144* 157 144* 156 585*   Basic Metabolic Panel: Recent Labs  Lab 12/24/20 0411 12/25/20 0529 12/26/20 0436 12/27/20 0425 12/28/20 0450 12/29/20 0339  NA 136 136 135 132* 132* 130*  K 4.3 4.2 3.9 4.6 4.3 4.5  CL 98 98 97* 95* 94* 90*  CO2 26 27 27 29 28 27   GLUCOSE 91 104* 88 93 91 98  BUN 44* 45* 44* 48* 51* 55*  CREATININE 2.40* 2.36* 2.19* 2.28* 2.17* 2.19*  CALCIUM 9.1 9.3 9.0 9.0 8.9 9.0  MG 1.6* 1.9 1.8 1.8 1.7  --    GFR: Estimated Creatinine Clearance: 25.5 mL/min (A) (by C-G formula based on SCr of 2.19 mg/dL (H)). Liver Function Tests: Recent Labs  Lab 12/23/20 0925 12/24/20 0411  AST 24 21  ALT 14 14  ALKPHOS 68 68  BILITOT 1.5* 1.4*  PROT 7.2  6.7  ALBUMIN 4.3 3.9   Recent Labs  Lab 12/23/20 0925  LIPASE 46   No results for input(s): AMMONIA in the last 168 hours. Coagulation Profile: No results for input(s): INR, PROTIME in the last 168 hours. Cardiac Enzymes: No results for input(s): CKTOTAL, CKMB, CKMBINDEX, TROPONINI in the last 168 hours. BNP (last 3 results) No results for input(s): PROBNP in the last 8760 hours. HbA1C: No results for input(s): HGBA1C in the last 72 hours. CBG: No results for input(s): GLUCAP in the last 168 hours. Lipid Profile: No results for input(s): CHOL, HDL, LDLCALC, TRIG, CHOLHDL, LDLDIRECT in the last 72 hours. Thyroid Function Tests: No results for input(s): TSH, T4TOTAL, FREET4, T3FREE, THYROIDAB in the last 72 hours. Anemia Panel: No results for input(s): VITAMINB12, FOLATE, FERRITIN, TIBC, IRON, RETICCTPCT in the last 72  hours.  Sepsis Labs: No results for input(s): PROCALCITON, LATICACIDVEN in the last 168 hours.  Recent Results (from the past 240 hour(s))  Resp Panel by RT-PCR (Flu A&B, Covid) Nasopharyngeal Swab     Status: None   Collection Time: 12/23/20 11:02 AM   Specimen: Nasopharyngeal Swab; Nasopharyngeal(NP) swabs in vial transport medium  Result Value Ref Range Status   SARS Coronavirus 2 by RT PCR NEGATIVE NEGATIVE Final    Comment: (NOTE) SARS-CoV-2 target nucleic acids are NOT DETECTED.  The SARS-CoV-2 RNA is generally detectable in upper respiratory specimens during the acute phase of infection. The lowest concentration of SARS-CoV-2 viral copies this assay can detect is 138 copies/mL. A negative result does not preclude SARS-Cov-2 infection and should not be used as the sole basis for treatment or other patient management decisions. A negative result may occur with  improper specimen collection/handling, submission of specimen other than nasopharyngeal swab, presence of viral mutation(s) within the areas targeted by this assay, and inadequate number of  viral copies(<138 copies/mL). A negative result must be combined with clinical observations, patient history, and epidemiological information. The expected result is Negative.  Fact Sheet for Patients:  EntrepreneurPulse.com.au  Fact Sheet for Healthcare Providers:  IncredibleEmployment.be  This test is no t yet approved or cleared by the [place] FDA and  has been authorized for detection and/or diagnosis of SARS-CoV-2 by FDA under an Emergency Use Authorization (EUA). This EUA will remain  in effect (meaning this test can be used) for the duration of the COVID-19 declaration under Section 564(b)(1) of the Act, 21 U.S.C.section 360bbb-3(b)(1), unless the authorization is terminated  or revoked sooner.       Influenza A by PCR NEGATIVE NEGATIVE Final   Influenza B by PCR NEGATIVE NEGATIVE Final    Comment: (NOTE) The Xpert Xpress SARS-CoV-2/FLU/RSV plus assay is intended as an aid in the diagnosis of influenza from Nasopharyngeal swab specimens and should not be used as a sole basis for treatment. Nasal washings and aspirates are unacceptable for Xpert Xpress SARS-CoV-2/FLU/RSV testing.  Fact Sheet for Patients: EntrepreneurPulse.com.au  Fact Sheet for Healthcare Providers: IncredibleEmployment.be  This test is not yet approved or cleared by the [place] FDA and has been authorized for detection and/or diagnosis of SARS-CoV-2 by FDA under an Emergency Use Authorization (EUA). This EUA will remain in effect (meaning this test can be used) for the duration of the COVID-19 declaration under Section 564(b)(1) of the Act, 21 U.S.C. section 360bbb-3(b)(1), unless the authorization is terminated or revoked.  Performed at [facility], [address] [place], [place] [address]          Radiology Studies: No results found.      Scheduled Meds:  atorvastatin  40 mg Oral Q  supper   digoxin  0.0625 mg Oral Daily   famotidine  10 mg Oral QODAY   feeding supplement  237 mL Oral TID BM   midodrine  10 mg Oral TID WC   multivitamin with minerals  1 tablet Oral Daily   pantoprazole  40 mg Oral BID   ranolazine  500 mg Oral BID   sucralfate  1 g Oral BID   tamsulosin  0.4 mg Oral QPC supper   Continuous Infusions:   LOS: 5 days    Time spent: 25 mins     Desma Maxim, MD [facility]  If 7PM-7AM, please contact night-coverage 12/29/2020, 10:44 AM

## 2020-12-29 NOTE — TOC Initial Note (Signed)
Transition of Care [facility]) - Initial/Assessment Note    Patient Details  Name: Andres Gutierrez MRN: [id number] Date of Birth: 17-Dec-1930  Transition of Care [facility]) CM/SW Contact:    Alberteen Sam, LCSW Phone Number: 12/29/2020, 11:53 AM  Clinical Narrative:                  CSW spoke with patient's daughter Andres Gutierrez regarding [facility] recommendations by PT and that patient is already active with [facility].   Andres Gutierrez reports patient needs SNF, CSW informed her this was not PT recommendation therefore insurance will not cover. CSW informed Andres Gutierrez current option were if they can private pay for SNF, or home with home health and they an add private duty caregivers in the home if needed.   Andres Gutierrez continues to report patient cannot go home, however does not propose another discharge plan for him. At the end of the conversation Andres Gutierrez reports she will be speaking to "someone else".   CSW unclear of discharge plan as family continue to pose barriers in choosing discharge venue given option above.     Expected Discharge Plan: [place] Barriers to Discharge: Continued Medical Work up   Patient Goals and CMS Choice   CMS Medicare.gov Compare Post Acute Care list provided to:: Patient Represenative (must comment) (daughter Andres Gutierrez) Choice offered to / list presented to : Adult Children  Expected Discharge Plan and Services Expected Discharge Plan: [place] Acute Care Choice:  (TBD)                                        Prior Living Arrangements/Services                       Activities of Daily Living Home Assistive Devices/Equipment: Environmental consultant (specify type) (front wheel) ADL Screening (condition at time of admission) Patient's cognitive ability adequate to safely complete daily activities?: Yes Is the patient deaf or have difficulty hearing?: No Does the patient have difficulty seeing, even when wearing glasses/contacts?: No Does the  patient have difficulty concentrating, remembering, or making decisions?: No Patient able to express need for assistance with ADLs?: Yes Does the patient have difficulty dressing or bathing?: No Independently performs ADLs?: No Does the patient have difficulty walking or climbing stairs?: Yes Weakness of Legs: Both Weakness of Arms/Hands: None  Permission Sought/Granted                  Emotional Assessment              Admission diagnosis:  Acute on chronic combined systolic and diastolic congestive heart failure ([place]) [I50.43] Acute combined systolic (congestive) and diastolic (congestive) heart failure (HCC) [I50.41] CHF (congestive heart failure) ([place]) [I50.9] Patient Active Problem List   Diagnosis Date Noted   Protein-calorie malnutrition, severe 12/27/2020   CHF (congestive heart failure) ([place]) 12/24/2020   DNR (do not resuscitate)/DNI(Do Not Intubate) 12/23/2020   Acute combined systolic (congestive) and diastolic (congestive) heart failure ([place]) 12/23/2020   Chronic combined systolic and diastolic congestive heart failure ([place]) 12/09/2020   Stool guaiac positive 12/09/2020   Acute on chronic combined systolic (congestive) and diastolic (congestive) heart failure ([place]) 11/27/2020   Hyperkalemia 11/26/2020   Acute renal failure superimposed on stage 3b chronic kidney disease ([place]) 11/26/2020   Hypomagnesemia 10/03/2020   Recurrent right  pleural effusion 10/03/2020   Chest pain 10/03/2020   AVM (arteriovenous malformation) of small bowel, acquired with hemorrhage 08/23/2020   GI bleed 08/12/2020   MDS (myelodysplastic syndrome), low grade ([place]) 07/22/2020   History of 2019 novel coronavirus disease (COVID-19) 05/19/2020   Cerebral amyloid angiopathy ([place]) 03/05/2020   Abdominal pain 03/03/2020   HLD (hyperlipidemia) 03/03/2020   Blurry vision 03/03/2020   CKD (chronic kidney disease), stage IIIb 03/03/2020   Left ureteral stone 03/03/2020   Generalized  weakness    Cerebral hemorrhage, nontraumatic ([place]) 03/02/2020   Chronic cholecystitis 11/06/2019   Malnutrition of moderate degree 07/30/2019   Foot infection    Weakness    NSTEMI (non-ST elevated myocardial infarction) ([place])    Orthostatic hypotension 01/07/2019   Symptomatic anemia 08/05/2018   Anemia of chronic kidney failure, stage 3 (moderate) ([place]) 08/05/2018   BPH (benign prostatic hyperplasia) 07/24/2018   Gastroesophageal reflux disease with esophagitis 07/24/2018   Thrombocytopenia ([place]) 07/24/2018   Primary osteoarthritis of both knees 08/22/2017   Medicare annual wellness visit, initial 67/04/4579   Chronic systolic CHF (congestive heart failure), NYHA class 3 ([place]) 11/27/2016   Chewing tobacco use 01/17/2016   Dyspnea on exertion 12/23/2015   Elevated troponin 12/23/2015   Iron deficiency anemia due to chronic blood loss 12/20/2015   Femoral neck fracture, left, closed, initial encounter 11/03/2015   Chronic atrial fibrillation ([place])    Coronary artery disease involving native coronary artery of native heart without angina pectoris    B12 deficiency 08/03/2015   Erosive esophagitis 08/03/2015   Adenopathy    CAD (coronary artery disease) 12/22/2014   HTN (hypertension) 12/22/2014   Cardiomyopathy, ischemic 07/27/2014   PCP:  Rusty Aus, MD Pharmacy:   [facility] [phone] [address], [place] [facility] [address] AT [address] [facility] [place] [address] Phone: [phone] Fax: [phone]     Social Determinants of Health (SDOH) Interventions    Readmission Risk Interventions Readmission Risk Prevention Plan 07/30/2019 06/08/2019  Transportation Screening Complete Complete  PCP or Specialist Appt within 3-5 Days Complete Complete  HRI or Home Care Consult Complete Complete  Social Work Consult for [place] Planning/Counseling - Complete  Palliative Care Screening - Not Applicable  Medication Review Press photographer)  Complete Complete  Some recent data might be hidden

## 2020-12-29 NOTE — Progress Notes (Signed)
PT Cancellation Note  Patient Details Name: MICAJAH DENNIN MRN: [id number] DOB: June 10, 1930   Cancelled Treatment:     Therapist in this pm, pt sitting up in chair, visitor in room. Pt stated he didn't feel well and was unable to tolerate any activity.  Will continue per POC.   Josie Dixon 12/29/2020, 1:03 PM

## 2020-12-29 NOTE — Plan of Care (Signed)
  Problem: Health Behavior/Discharge Planning: Goal: Ability to manage health-related needs will improve Outcome: Progressing   Problem: Clinical Measurements: Goal: Respiratory complications will improve Outcome: Progressing Goal: Cardiovascular complication will be avoided Outcome: Progressing

## 2020-12-29 NOTE — TOC Progression Note (Signed)
Transition of Care [facility]) - Progression Note    Patient Details  Name: Andres Gutierrez MRN: [id number] Date of Birth: Aug 16, 1930  Transition of Care [facility]) CM/SW [place], [place] Phone Number: 12/29/2020, 2:57 PM  Clinical Narrative:     Per patient's daughter [name], patient did not receive [facility] services that she believe he needed such as Therapist, sports. CSW inquired to [name] if she had spoken to [facility] to follow up on this, she reports she did however when asked how the conversation resulted she was unable to provide any information.   CSW followed up with [facility] who reports below time line of [place] services:  7/28- PT SOC performed 7/30- attempted to reach but no phone answer 8/4: seen by OT 8/10: daughter requested to reschedule the services 8/10: it was reported to [facility] by their RN that patient and family were refusing RN services.    CSW notes above attempts for [facility] to follow up with home services with patient /daughter however seem to have had barriers in family not wanting services.      Expected Discharge Plan: [place] Barriers to Discharge: Continued Medical Work up  Expected Discharge Plan and Services Expected Discharge Plan: [place]     Post Acute Care Choice:  (TBD)                                         Social Determinants of Health (SDOH) Interventions    Readmission Risk Interventions Readmission Risk Prevention Plan 07/30/2019 06/08/2019  Transportation Screening Complete Complete  PCP or Specialist Appt within 3-5 Days Complete Complete  HRI or Home Care Consult Complete Complete  Social Work Consult for [place] Planning/Counseling - Complete  Palliative Care Screening - Not Applicable  Medication Review Press photographer) Complete Complete  Some recent data might be hidden

## 2020-12-30 ENCOUNTER — Inpatient Hospital Stay: Payer: PPO

## 2020-12-30 DIAGNOSIS — I5043 Acute on chronic combined systolic (congestive) and diastolic (congestive) heart failure: Secondary | ICD-10-CM | POA: Diagnosis not present

## 2020-12-30 LAB — BASIC METABOLIC PANEL
Anion gap: 13 (ref 5–15)
BUN: 65 mg/dL — ABNORMAL HIGH (ref 8–23)
CO2: 25 mmol/L (ref 22–32)
Calcium: 8.9 mg/dL (ref 8.9–10.3)
Chloride: 92 mmol/L — ABNORMAL LOW (ref 98–111)
Creatinine, Ser: 2.54 mg/dL — ABNORMAL HIGH (ref 0.61–1.24)
GFR, Estimated: 24 mL/min — ABNORMAL LOW (ref >60)
Glucose, Bld: 104 mg/dL — ABNORMAL HIGH (ref 70–99)
Potassium: 5.2 mmol/L — ABNORMAL HIGH (ref 3.5–5.1)
Sodium: 130 mmol/L — ABNORMAL LOW (ref 135–145)

## 2020-12-30 LAB — CBC
HCT: 25.3 % — ABNORMAL LOW (ref 39.0–52.0)
Hemoglobin: 8.4 g/dL — ABNORMAL LOW (ref 13.0–17.0)
MCH: 33.6 pg (ref 26.0–34.0)
MCHC: 33.2 g/dL (ref 30.0–36.0)
MCV: 101.2 fL — ABNORMAL HIGH (ref 80.0–100.0)
Platelets: 160 10*3/uL (ref 150–400)
RBC: 2.5 MIL/uL — ABNORMAL LOW (ref 4.22–5.81)
RDW: 17.9 % — ABNORMAL HIGH (ref 11.5–15.5)
WBC: 17.1 10*3/uL — ABNORMAL HIGH (ref 4.0–10.5)
nRBC: 0 % (ref 0.0–0.2)

## 2020-12-30 MED ORDER — FUROSEMIDE 10 MG/ML IJ SOLN
80.0000 mg | Freq: Once | INTRAMUSCULAR | Status: AC
  Administered 2020-12-30: 80 mg via INTRAVENOUS
  Filled 2020-12-30: qty 8

## 2020-12-30 MED ORDER — TORSEMIDE 20 MG PO TABS
20.0000 mg | ORAL_TABLET | Freq: Every day | ORAL | Status: DC
  Administered 2020-12-31: 20 mg via ORAL
  Filled 2020-12-30: qty 1

## 2020-12-30 NOTE — Progress Notes (Signed)
[facility]) Hospital Liaison RN Note  Patient is not eligible for Hospice Home at this time.   Hospice services at home eligibility confirmed. Patient and his daughter, [name] agree to patient discharging home with hospice services.   Initiated education with patient and [name] related to hospice philosophy, services and team approach to care. Patient/family verbalized understanding of information given.   DME needs discussed. Patient has the following equipment in the home: [facility], shower bench, walker. Family requests the following equipment for delivery: O2, hospital bed, OBT and w/c. Address has been verified and is correct in the chart. Tiana Loft [phone] is the family contact to arrange time of equipment delivery.   Please send signed and completed DNR home with patient/family. Please provide prescriptions at discharge as needed to ensure ongoing symptom management.   ACC information and contact numbers given to [place]. Above information shared with Meagan Hagwood, LCSW TOC Manager.   Please do not hesitate to call with any questions.   Thank you,   Bobbie "Loren Racer, [place], BSN [facility] Liaison [phone]

## 2020-12-30 NOTE — Progress Notes (Addendum)
PROGRESS NOTE   HPI was taken from Dr. Bridgett Larsson: 87 year old white male with a history of combined systolic diastolic heart failure with an EF of 25%, hypertension, CKD stage IIIb, myelodysplastic syndrome, recurrent right pleural effusion, chronic atrial fibrillation not on anticoagulation due to history of prior GI bleed, BPH who presents to the ER today with worsening shortness of breath.  Daughter states the patient has been short of breath for last several weeks.  Patient recently discharged at the end of July for about of heart failure.  Patient recently had a packed red blood cell transfusion in hematology clinic due to a low hemoglobin.  This was 3 days ago.  Patient's had worsening shortness of breath since then.  He was seen in the cardiology office yesterday and had a dose of IV Lasix.  Patient states that he urinated about 1 to 2 L overnight however patient still symptomatically short of breath.  Patient recently seen by nephrology due to CKD stage IIIb.  No plans were made yet to discuss dialysis for him yet.    Andres Gutierrez  TLX:[id number] DOB: 1931-03-15 DOA: 12/23/2020 PCP: Rusty Aus, MD   Assessment & Plan:   Principal Problem:   Acute on chronic combined systolic (congestive) and diastolic (congestive) heart failure (HCC) Active Problems:   Chronic atrial fibrillation (HCC)   BPH (benign prostatic hyperplasia)   CKD (chronic kidney disease), stage IIIb   MDS (myelodysplastic syndrome), low grade (HCC)   Recurrent right pleural effusion   DNR (do not resuscitate)/DNI(Do Not Intubate)   Acute combined systolic (congestive) and diastolic (congestive) heart failure (HCC)   CHF (congestive heart failure) (HCC)   Protein-calorie malnutrition, severe  Acute on chronic combined CHF exacerbation: echo showed EF <74%, normal diastolic function. Coreg held 2/2 hypotension. Continuing digoxin. Suspect cardiorenal.  Resuming diuretic  today given new O2 requirement (will give lasix  80 then re-start home torsemide 20 tomorrow)  End of life care:  Daughter does not think safe to discharge home w/o services. Have met w/ palliative and hospice. Plan for home w/ home hospice tomorrow. Daughter wants to continue current scope of care now but hold on further w/u or treatments, is considering transition to full comfort care at some point.  Plan for d/c tomorrow  MDS: chronic. S/p pRBC transfusion 3 days prior to admission to the hospital. Likely etiology on anemia & thrombocytopenia. B12, folate are WNL. Plan on outpatient hematology f/u, likely will not pursue that given transition to hospice  Recurrent right pleural effusion: repeat CXR still shows small pleural effusion unlikely amenable thoracentesis at this time.   Acute hypoxic respiratory failure: 2/2 chf. Initially requiring o2, weaned off, now back on at 4 L. Will resume diuresis today.  CKDIIIb w/ aki vs progression to ckd4. Cr steady at low 2s, 2.54 today. BUN has up-trended. Re-started lasix today, likely cardiorenal  Chronic a. fib: continue on digoxin. BB held. Not on anticoagulation secondary to hx of GI bleeding  Orthostatic hypotension: hold home midodrine given advanced heart failure  GI bleed: recent admission for GIB, EGD showing AVM. Here hgb stable, no report of melena/hematochezia.  BPH: continue on home dose of tamsulosin   Hypomagnesemia: WNL   Severe protein-calorie malnutrition: continue on nutritional supplements   DVT prophylaxis: SCDs Code Status: DNR Family Communication: daughter updated telephonically 8/19 Disposition Plan: home with home hospice  Level of care: Med-Surg  Status is: Inpatient  Remains inpatient appropriate because:Unsafe d/c plan, IV treatments appropriate due to  intensity of illness or inability to take PO, and Inpatient level of care appropriate due to severity of illness,   Dispo: The patient is from: Home              Anticipated d/c is JG:OTLX with home  hospice              Patient currently is not medically stable to d/c.   Difficult to place patient unclear   Consultants:  cardio  Procedures:  Antimicrobials:   Subjective: Feeling well, has appetite, breathing a little worse  Objective: Vitals:   12/30/20 0058 12/30/20 0349 12/30/20 0828 12/30/20 1122  BP: 118/86  (!) 104/58 (!) 95/50  Pulse: 80  78 75  Resp: 18  17 16   Temp: 98.1 F (36.7 C)  97.9 F (36.6 C) 98 F (36.7 C)  TempSrc:   Oral Oral  SpO2: 98%  90% 99%  Weight: 79.4 kg 79.4 kg    Height:        Intake/Output Summary (Last 24 hours) at 12/30/2020 1235 Last data filed at 12/30/2020 0900 Gross per 24 hour  Intake 477 ml  Output 200 ml  Net 277 ml   Filed Weights   12/29/20 0402 12/30/20 0058 12/30/20 0349  Weight: 75.8 kg 79.4 kg 79.4 kg    Examination:  General exam: Appears comfortable Respiratory system: decreased breath sounds b/l, scattered rhonchi, no wheeze Cardiovascular system: S1/S2+. No rubs or clicks   Gastrointestinal system: Abd is soft, NT, ND & hypoactive bowel sounds  Central nervous system: Alert and oriented. Moves all extremities  Psychiatry: Judgement and insight appear poor. Flat mood and affect    Data Reviewed: I have personally reviewed following labs and imaging studies  CBC: Recent Labs  Lab 12/24/20 0411 12/25/20 0529 12/26/20 0436 12/27/20 0425 12/28/20 0450 12/29/20 0339 12/30/20 0429  WBC 7.3   < > 6.1 6.6 7.6 11.2* 17.1*  NEUTROABS 3.0  --   --   --   --   --   --   HGB 8.7*   < > 8.9* 9.7* 8.8* 8.9* 8.4*  HCT 26.2*   < > 25.9* 28.5* 26.1* 26.6* 25.3*  MCV 100.4*   < > 99.2 100.4* 99.2 97.8 101.2*  PLT 143*   < > 157 144* 156 146* 160   < > = values in this interval not displayed.   Basic Metabolic Panel: Recent Labs  Lab 12/24/20 0411 12/25/20 0529 12/26/20 0436 12/27/20 0425 12/28/20 0450 12/29/20 0339 12/30/20 0429  NA 136 136 135 132* 132* 130* 130*  K 4.3 4.2 3.9 4.6 4.3 4.5 5.2*   CL 98 98 97* 95* 94* 90* 92*  CO2 26 27 27 29 28 27 25   GLUCOSE 91 104* 88 93 91 98 104*  BUN 44* 45* 44* 48* 51* 55* 65*  CREATININE 2.40* 2.36* 2.19* 2.28* 2.17* 2.19* 2.54*  CALCIUM 9.1 9.3 9.0 9.0 8.9 9.0 8.9  MG 1.6* 1.9 1.8 1.8 1.7  --   --    GFR: Estimated Creatinine Clearance: 23 mL/min (A) (by C-G formula based on SCr of 2.54 mg/dL (H)). Liver Function Tests: Recent Labs  Lab 12/24/20 0411  AST 21  ALT 14  ALKPHOS 68  BILITOT 1.4*  PROT 6.7  ALBUMIN 3.9   No results for input(s): LIPASE, AMYLASE in the last 168 hours.  No results for input(s): AMMONIA in the last 168 hours. Coagulation Profile: No results for input(s): INR, PROTIME in the last 168  hours. Cardiac Enzymes: No results for input(s): CKTOTAL, CKMB, CKMBINDEX, TROPONINI in the last 168 hours. BNP (last 3 results) No results for input(s): PROBNP in the last 8760 hours. HbA1C: No results for input(s): HGBA1C in the last 72 hours. CBG: No results for input(s): GLUCAP in the last 168 hours. Lipid Profile: No results for input(s): CHOL, HDL, LDLCALC, TRIG, CHOLHDL, LDLDIRECT in the last 72 hours. Thyroid Function Tests: No results for input(s): TSH, T4TOTAL, FREET4, T3FREE, THYROIDAB in the last 72 hours. Anemia Panel: No results for input(s): VITAMINB12, FOLATE, FERRITIN, TIBC, IRON, RETICCTPCT in the last 72 hours.  Sepsis Labs: No results for input(s): PROCALCITON, LATICACIDVEN in the last 168 hours.  Recent Results (from the past 240 hour(s))  Resp Panel by RT-PCR (Flu A&B, Covid) Nasopharyngeal Swab     Status: None   Collection Time: 12/23/20 11:02 AM   Specimen: Nasopharyngeal Swab; Nasopharyngeal(NP) swabs in vial transport medium  Result Value Ref Range Status   SARS Coronavirus 2 by RT PCR NEGATIVE NEGATIVE Final    Comment: (NOTE) SARS-CoV-2 target nucleic acids are NOT DETECTED.  The SARS-CoV-2 RNA is generally detectable in upper respiratory specimens during the acute phase of  infection. The lowest concentration of SARS-CoV-2 viral copies this assay can detect is 138 copies/mL. A negative result does not preclude SARS-Cov-2 infection and should not be used as the sole basis for treatment or other patient management decisions. A negative result may occur with  improper specimen collection/handling, submission of specimen other than nasopharyngeal swab, presence of viral mutation(s) within the areas targeted by this assay, and inadequate number of viral copies(<138 copies/mL). A negative result must be combined with clinical observations, patient history, and epidemiological information. The expected result is Negative.  Fact Sheet for Patients:  EntrepreneurPulse.com.au  Fact Sheet for Healthcare Providers:  IncredibleEmployment.be  This test is no t yet approved or cleared by the [place] FDA and  has been authorized for detection and/or diagnosis of SARS-CoV-2 by FDA under an Emergency Use Authorization (EUA). This EUA will remain  in effect (meaning this test can be used) for the duration of the COVID-19 declaration under Section 564(b)(1) of the Act, 21 U.S.C.section 360bbb-3(b)(1), unless the authorization is terminated  or revoked sooner.       Influenza A by PCR NEGATIVE NEGATIVE Final   Influenza B by PCR NEGATIVE NEGATIVE Final    Comment: (NOTE) The Xpert Xpress SARS-CoV-2/FLU/RSV plus assay is intended as an aid in the diagnosis of influenza from Nasopharyngeal swab specimens and should not be used as a sole basis for treatment. Nasal washings and aspirates are unacceptable for Xpert Xpress SARS-CoV-2/FLU/RSV testing.  Fact Sheet for Patients: EntrepreneurPulse.com.au  Fact Sheet for Healthcare Providers: IncredibleEmployment.be  This test is not yet approved or cleared by the [place] FDA and has been authorized for detection and/or diagnosis of SARS-CoV-2  by FDA under an Emergency Use Authorization (EUA). This EUA will remain in effect (meaning this test can be used) for the duration of the COVID-19 declaration under Section 564(b)(1) of the Act, 21 U.S.C. section 360bbb-3(b)(1), unless the authorization is terminated or revoked.  Performed at [facility], [address], [place], [place] [address]          Radiology Studies: No results found.      Scheduled Meds:  atorvastatin  40 mg Oral Q supper   digoxin  0.0625 mg Oral Daily   famotidine  10 mg Oral QODAY   feeding supplement  237  mL Oral TID BM   midodrine  10 mg Oral TID WC   multivitamin with minerals  1 tablet Oral Daily   pantoprazole  40 mg Oral BID   ranolazine  500 mg Oral BID   sucralfate  1 g Oral BID   tamsulosin  0.4 mg Oral QPC supper   Continuous Infusions:   LOS: 6 days    Time spent: 25 mins     Desma Maxim, MD [facility]  If 7PM-7AM, please contact night-coverage 12/30/2020, 12:35 PM

## 2020-12-30 NOTE — TOC Progression Note (Signed)
Transition of Care [facility]) - Progression Note    Patient Details  Name: Andres Gutierrez MRN: [id number] Date of Birth: 11-Jul-1930  Transition of Care [facility]) CM/SW [place], LCSW Phone Number: 12/30/2020, 12:06 PM  Clinical Narrative:     TOC notified that patient will now discharge home with [facility] when medically ready.     Expected Discharge Plan: [place] Barriers to Discharge: Continued Medical Work up  Expected Discharge Plan and Services Expected Discharge Plan: [place]     Post Acute Care Choice:  (TBD)                                         Social Determinants of Health (SDOH) Interventions    Readmission Risk Interventions Readmission Risk Prevention Plan 07/30/2019 06/08/2019  Transportation Screening Complete Complete  PCP or Specialist Appt within 3-5 Days Complete Complete  HRI or Home Care Consult Complete Complete  Social Work Consult for [place] Planning/Counseling - Complete  Palliative Care Screening - Not Applicable  Medication Review Press photographer) Complete Complete  Some recent data might be hidden

## 2020-12-31 DIAGNOSIS — I5043 Acute on chronic combined systolic (congestive) and diastolic (congestive) heart failure: Secondary | ICD-10-CM | POA: Diagnosis not present

## 2020-12-31 LAB — CBC
HCT: 24.8 % — ABNORMAL LOW (ref 39.0–52.0)
Hemoglobin: 8 g/dL — ABNORMAL LOW (ref 13.0–17.0)
MCH: 32 pg (ref 26.0–34.0)
MCHC: 32.3 g/dL (ref 30.0–36.0)
MCV: 99.2 fL (ref 80.0–100.0)
Platelets: 134 10*3/uL — ABNORMAL LOW (ref 150–400)
RBC: 2.5 MIL/uL — ABNORMAL LOW (ref 4.22–5.81)
RDW: 17.5 % — ABNORMAL HIGH (ref 11.5–15.5)
WBC: 12.3 10*3/uL — ABNORMAL HIGH (ref 4.0–10.5)
nRBC: 0 % (ref 0.0–0.2)

## 2020-12-31 LAB — BASIC METABOLIC PANEL
Anion gap: 11 (ref 5–15)
BUN: 70 mg/dL — ABNORMAL HIGH (ref 8–23)
CO2: 29 mmol/L (ref 22–32)
Calcium: 9 mg/dL (ref 8.9–10.3)
Chloride: 89 mmol/L — ABNORMAL LOW (ref 98–111)
Creatinine, Ser: 2.76 mg/dL — ABNORMAL HIGH (ref 0.61–1.24)
GFR, Estimated: 22 mL/min — ABNORMAL LOW (ref >60)
Glucose, Bld: 105 mg/dL — ABNORMAL HIGH (ref 70–99)
Potassium: 5 mmol/L (ref 3.5–5.1)
Sodium: 129 mmol/L — ABNORMAL LOW (ref 135–145)

## 2020-12-31 NOTE — TOC Progression Note (Addendum)
Transition of Care [facility]) - Progression Note    Patient Details  Name: Andres Gutierrez MRN: [id number] Date of Birth: Nov 19, 1930  Transition of Care [facility]) CM/SW Contact  Izola Price, RN Phone Number: 12/31/2020, 3:28 PM  Clinical Narrative:  Call in to [facility] hospice to inform of pending discharge Sunday 12/31/20  per provider. Intake staff will push through and CM call back number given.  Oxygen qualifying test ordered. Simmie Davies RN CM     Expected Discharge Plan: [place] Barriers to Discharge: Continued Medical Work up  Expected Discharge Plan and Services Expected Discharge Plan: [place]     Post Acute Care Choice:  (TBD)                                         Social Determinants of Health (SDOH) Interventions    Readmission Risk Interventions Readmission Risk Prevention Plan 07/30/2019 06/08/2019  Transportation Screening Complete Complete  PCP or Specialist Appt within 3-5 Days Complete Complete  HRI or Home Care Consult Complete Complete  Social Work Consult for [place] Planning/Counseling - Complete  Palliative Care Screening - Not Applicable  Medication Review Press photographer) Complete Complete  Some recent data might be hidden

## 2020-12-31 NOTE — Progress Notes (Signed)
SATURATION QUALIFICATIONS: (This note is used to comply with regulatory documentation for home oxygen)  Patient Saturations on Room Air at Rest = 93%  Patient Saturations on Room Air while Ambulating = 87%  Patient Saturations on 2 Liters of oxygen while Ambulating = 91%  Please briefly explain why patient needs home oxygen: To maintain adequate SpO2 while exerting himself

## 2020-12-31 NOTE — Progress Notes (Signed)
PROGRESS NOTE   HPI was taken from Dr. Bridgett Larsson: 87 year old white male with a history of combined systolic diastolic heart failure with an EF of 25%, hypertension, CKD stage IIIb, myelodysplastic syndrome, recurrent right pleural effusion, chronic atrial fibrillation not on anticoagulation due to history of prior GI bleed, BPH who presents to the ER today with worsening shortness of breath.  Daughter states the patient has been short of breath for last several weeks.  Patient recently discharged at the end of July for about of heart failure.  Patient recently had a packed red blood cell transfusion in hematology clinic due to a low hemoglobin.  This was 3 days ago.  Patient's had worsening shortness of breath since then.  He was seen in the cardiology office yesterday and had a dose of IV Lasix.  Patient states that he urinated about 1 to 2 L overnight however patient still symptomatically short of breath.  Patient recently seen by nephrology due to CKD stage IIIb.  No plans were made yet to discuss dialysis for him yet.    Andres Gutierrez  ACZ:[id number] DOB: Mar 01, 1931 DOA: 12/23/2020 PCP: Rusty Aus, MD   Assessment & Plan:   Principal Problem:   Acute on chronic combined systolic (congestive) and diastolic (congestive) heart failure (HCC) Active Problems:   Chronic atrial fibrillation (HCC)   BPH (benign prostatic hyperplasia)   CKD (chronic kidney disease), stage IIIb   MDS (myelodysplastic syndrome), low grade (HCC)   Recurrent right pleural effusion   DNR (do not resuscitate)/DNI(Do Not Intubate)   Acute combined systolic (congestive) and diastolic (congestive) heart failure (HCC)   CHF (congestive heart failure) (HCC)   Protein-calorie malnutrition, severe  Acute on chronic combined CHF exacerbation: echo showed EF <10%, normal diastolic function. Coreg held 2/2 hypotension. Continuing digoxin. Suspect cardiorenal.  Diuresed yesterday due to worsening o2 requirement and cxr showing  pulmonary congestion. Today breathing improved but kidney function has worsened  End of life care:  Daughter does not think safe to discharge home w/o services. Have met w/ palliative and hospice. Plan for home w/ home hospice tomorrow. Daughter wants to continue current scope of care now but hold on further w/u or treatments, is considering transition to full comfort care at some point.  Plan for d/c tomorrow as she is getting a home aid set up today  MDS: chronic. S/p pRBC transfusion 3 days prior to admission to the hospital. Likely etiology on anemia & thrombocytopenia. B12, folate are WNL. Plan on outpatient hematology f/u, likely will not pursue that given transition to hospice  Recurrent right pleural effusion: repeat CXR still shows small pleural effusion unlikely amenable thoracentesis at this time.   Acute hypoxic respiratory failure: 2/2 chf. Initially requiring o2, weaned off, now back on at 3 L, will plan on o2 at discharge  CKDIIIb w/ aki vs progression to ckd4. Cr steady at low 2s, 2.76 today in settting of diuresis.. BUN has up-trended. Holding diuretic  Chronic a. fib: continue on digoxin. BB held 2/2 hypotension Not on anticoagulation secondary to hx of GI bleeding  Orthostatic hypotension: hold home midodrine given advanced heart failure  GI bleed: recent admission for GIB, EGD showing AVM. Here hgb stable, no report of melena/hematochezia.  BPH: continue on home dose of tamsulosin   Hypomagnesemia: WNL   Severe protein-calorie malnutrition: continue on nutritional supplements   DVT prophylaxis: SCDs Code Status: DNR Family Communication: daughter updated telephonically 8/20 Disposition Plan: home with home hospice  Level of care:  Med-Surg  Status is: Inpatient  Remains inpatient appropriate because:Unsafe d/c plan, IV treatments appropriate due to intensity of illness or inability to take PO, and Inpatient level of care appropriate due to severity of illness,    Dispo: The patient is from: Home              Anticipated d/c is FR:TMYT with home hospice              Patient currently is not medically stable to d/c.   Difficult to place patient: no   Consultants:  cardio  Procedures:  Antimicrobials:   Subjective: Feeling well, has appetite, breathing improved  Objective: Vitals:   12/31/20 0335 12/31/20 0407 12/31/20 0839 12/31/20 1131  BP: (!) 101/54  98/63 (!) 88/49  Pulse: 79  81 74  Resp:   17 17  Temp: 98 F (36.7 C)  97.9 F (36.6 C) 98.2 F (36.8 C)  TempSrc:      SpO2: 97%  97% 98%  Weight:  79.4 kg    Height:        Intake/Output Summary (Last 24 hours) at 12/31/2020 1218 Last data filed at 12/31/2020 0530 Gross per 24 hour  Intake 580 ml  Output 1050 ml  Net -470 ml   Filed Weights   12/30/20 0058 12/30/20 0349 12/31/20 0407  Weight: 79.4 kg 79.4 kg 79.4 kg    Examination:  General exam: Appears comfortable Respiratory system: decreased breath sounds b/l, scattered rhonchi, no wheeze Cardiovascular system: S1/S2+. No rubs or clicks   Gastrointestinal system: Abd is soft, NT, ND & hypoactive bowel sounds  Central nervous system: Alert and oriented. Moves all extremities  Psychiatry: Judgement and insight appear poor. Flat mood and affect    Data Reviewed: I have personally reviewed following labs and imaging studies  CBC: Recent Labs  Lab 12/27/20 0425 12/28/20 0450 12/29/20 0339 12/30/20 0429 12/31/20 0426  WBC 6.6 7.6 11.2* 17.1* 12.3*  HGB 9.7* 8.8* 8.9* 8.4* 8.0*  HCT 28.5* 26.1* 26.6* 25.3* 24.8*  MCV 100.4* 99.2 97.8 101.2* 99.2  PLT 144* 156 146* 160 117*   Basic Metabolic Panel: Recent Labs  Lab 12/25/20 0529 12/26/20 0436 12/27/20 0425 12/28/20 0450 12/29/20 0339 12/30/20 0429 12/31/20 0426  NA 136 135 132* 132* 130* 130* 129*  K 4.2 3.9 4.6 4.3 4.5 5.2* 5.0  CL 98 97* 95* 94* 90* 92* 89*  CO2 _0 GLUCOSE 104* 88 93 91 98 104* 105*  BUN 45* 44* 48* 51*  55* 65* 70*  CREATININE 2.36* 2.19* 2.28* 2.17* 2.19* 2.54* 2.76*  CALCIUM 9.3 9.0 9.0 8.9 9.0 8.9 9.0  MG 1.9 1.8 1.8 1.7  --   --   --    GFR: Estimated Creatinine Clearance: 21.2 mL/min (A) (by C-G formula based on SCr of 2.76 mg/dL (H)). Liver Function Tests: No results for input(s): AST, ALT, ALKPHOS, BILITOT, PROT, ALBUMIN in the last 168 hours.  No results for input(s): LIPASE, AMYLASE in the last 168 hours.  No results for input(s): AMMONIA in the last 168 hours. Coagulation Profile: No results for input(s): INR, PROTIME in the last 168 hours. Cardiac Enzymes: No results for input(s): CKTOTAL, CKMB, CKMBINDEX, TROPONINI in the last 168 hours. BNP (last 3 results) No results for input(s): PROBNP in the last 8760 hours. HbA1C: No results for input(s): HGBA1C in the last 72 hours. CBG: No results for input(s): GLUCAP in the last 168 hours. Lipid Profile: No  results for input(s): CHOL, HDL, LDLCALC, TRIG, CHOLHDL, LDLDIRECT in the last 72 hours. Thyroid Function Tests: No results for input(s): TSH, T4TOTAL, FREET4, T3FREE, THYROIDAB in the last 72 hours. Anemia Panel: No results for input(s): VITAMINB12, FOLATE, FERRITIN, TIBC, IRON, RETICCTPCT in the last 72 hours.  Sepsis Labs: No results for input(s): PROCALCITON, LATICACIDVEN in the last 168 hours.  Recent Results (from the past 240 hour(s))  Resp Panel by RT-PCR (Flu A&B, Covid) Nasopharyngeal Swab     Status: None   Collection Time: 12/23/20 11:02 AM   Specimen: Nasopharyngeal Swab; Nasopharyngeal(NP) swabs in vial transport medium  Result Value Ref Range Status   SARS Coronavirus 2 by RT PCR NEGATIVE NEGATIVE Final    Comment: (NOTE) SARS-CoV-2 target nucleic acids are NOT DETECTED.  The SARS-CoV-2 RNA is generally detectable in upper respiratory specimens during the acute phase of infection. The lowest concentration of SARS-CoV-2 viral copies this assay can detect is 138 copies/mL. A negative result does not  preclude SARS-Cov-2 infection and should not be used as the sole basis for treatment or other patient management decisions. A negative result may occur with  improper specimen collection/handling, submission of specimen other than nasopharyngeal swab, presence of viral mutation(s) within the areas targeted by this assay, and inadequate number of viral copies(<138 copies/mL). A negative result must be combined with clinical observations, patient history, and epidemiological information. The expected result is Negative.  Fact Sheet for Patients:  EntrepreneurPulse.com.au  Fact Sheet for Healthcare Providers:  IncredibleEmployment.be  This test is no t yet approved or cleared by the [place] FDA and  has been authorized for detection and/or diagnosis of SARS-CoV-2 by FDA under an Emergency Use Authorization (EUA). This EUA will remain  in effect (meaning this test can be used) for the duration of the COVID-19 declaration under Section 564(b)(1) of the Act, 21 U.S.C.section 360bbb-3(b)(1), unless the authorization is terminated  or revoked sooner.       Influenza A by PCR NEGATIVE NEGATIVE Final   Influenza B by PCR NEGATIVE NEGATIVE Final    Comment: (NOTE) The Xpert Xpress SARS-CoV-2/FLU/RSV plus assay is intended as an aid in the diagnosis of influenza from Nasopharyngeal swab specimens and should not be used as a sole basis for treatment. Nasal washings and aspirates are unacceptable for Xpert Xpress SARS-CoV-2/FLU/RSV testing.  Fact Sheet for Patients: EntrepreneurPulse.com.au  Fact Sheet for Healthcare Providers: IncredibleEmployment.be  This test is not yet approved or cleared by the [place] FDA and has been authorized for detection and/or diagnosis of SARS-CoV-2 by FDA under an Emergency Use Authorization (EUA). This EUA will remain in effect (meaning this test can be used) for the  duration of the COVID-19 declaration under Section 564(b)(1) of the Act, 21 U.S.C. section 360bbb-3(b)(1), unless the authorization is terminated or revoked.  Performed at [facility], [address] [place], [place] [address]          Radiology Studies: [facility] Chest [place] 1 View  Result Date: 12/30/2020 CLINICAL DATA:  Heart failure. EXAM: PORTABLE CHEST 1 VIEW COMPARISON:  Chest radiograph dated 12/26/2020. FINDINGS: There is cardiomegaly with vascular congestion and edema. Small bilateral pleural effusions, right greater left with right lung base atelectasis or infiltrate. No pneumothorax. Median sternotomy wires and CABG vascular clips. No acute osseous pathology. IMPRESSION: Cardiomegaly with findings of CHF. Small bilateral pleural effusions, right greater than left. Electronically Signed   By: Anner Crete M.D.   On: 12/30/2020 20:05  Scheduled Meds:  atorvastatin  40 mg Oral Q supper   digoxin  0.0625 mg Oral Daily   famotidine  10 mg Oral QODAY   feeding supplement  237 mL Oral TID BM   multivitamin with minerals  1 tablet Oral Daily   pantoprazole  40 mg Oral BID   ranolazine  500 mg Oral BID   sucralfate  1 g Oral BID   tamsulosin  0.4 mg Oral QPC supper   torsemide  20 mg Oral Daily   Continuous Infusions:   LOS: 7 days    Time spent: 25 mins     Desma Maxim, MD [facility]  If 7PM-7AM, please contact night-coverage 12/31/2020, 12:18 PM

## 2021-01-01 DIAGNOSIS — I5043 Acute on chronic combined systolic (congestive) and diastolic (congestive) heart failure: Secondary | ICD-10-CM | POA: Diagnosis not present

## 2021-01-01 LAB — BASIC METABOLIC PANEL
Anion gap: 11 (ref 5–15)
BUN: 82 mg/dL — ABNORMAL HIGH (ref 8–23)
CO2: 29 mmol/L (ref 22–32)
Calcium: 9 mg/dL (ref 8.9–10.3)
Chloride: 90 mmol/L — ABNORMAL LOW (ref 98–111)
Creatinine, Ser: 2.7 mg/dL — ABNORMAL HIGH (ref 0.61–1.24)
GFR, Estimated: 22 mL/min — ABNORMAL LOW (ref >60)
Glucose, Bld: 93 mg/dL (ref 70–99)
Potassium: 4.7 mmol/L (ref 3.5–5.1)
Sodium: 130 mmol/L — ABNORMAL LOW (ref 135–145)

## 2021-01-01 LAB — CBC
HCT: 24 % — ABNORMAL LOW (ref 39.0–52.0)
Hemoglobin: 7.8 g/dL — ABNORMAL LOW (ref 13.0–17.0)
MCH: 32.9 pg (ref 26.0–34.0)
MCHC: 32.5 g/dL (ref 30.0–36.0)
MCV: 101.3 fL — ABNORMAL HIGH (ref 80.0–100.0)
Platelets: 127 10*3/uL — ABNORMAL LOW (ref 150–400)
RBC: 2.37 MIL/uL — ABNORMAL LOW (ref 4.22–5.81)
RDW: 17.3 % — ABNORMAL HIGH (ref 11.5–15.5)
WBC: 10 10*3/uL (ref 4.0–10.5)
nRBC: 0 % (ref 0.0–0.2)

## 2021-01-01 MED ORDER — POLYETHYLENE GLYCOL 3350 17 G PO PACK: 17.0000 g | PACK | Freq: Every day | ORAL | 0 refills | Status: AC

## 2021-01-01 NOTE — TOC Progression Note (Addendum)
Transition of Care [facility]) - Progression Note    Patient Details  Name: Andres Gutierrez MRN: [id number] Date of Birth: Oct 05, 1930  Transition of Care [facility]) CM/SW [place], [place] Phone Number: 01/01/2021, 9:09 AM  Clinical Narrative:     Update: EMS transport called. Daughter Andres Gutierrez updated.  CSW spoke with Horris Latino with [facility], she reports being aware of patient's potential discharge today. She reports she has a meeting with family at 9:00 am tomorrow 8/22.   CSW to follow up with MD regarding discharge today, if patient will go today CSW will arrange EMS transport.   Expected Discharge Plan: [place] Barriers to Discharge: Continued Medical Work up  Expected Discharge Plan and Services Expected Discharge Plan: [place]     Post Acute Care Choice:  (TBD)                                         Social Determinants of Health (SDOH) Interventions    Readmission Risk Interventions Readmission Risk Prevention Plan 07/30/2019 06/08/2019  Transportation Screening Complete Complete  PCP or Specialist Appt within 3-5 Days Complete Complete  HRI or Home Care Consult Complete Complete  Social Work Consult for [place] Planning/Counseling - Complete  Palliative Care Screening - Not Applicable  Medication Review Press photographer) Complete Complete  Some recent data might be hidden

## 2021-01-01 NOTE — Discharge Summary (Signed)
Andres Gutierrez YYT:[id number] DOB: 1930-08-12 DOA: 12/23/2020  PCP: Rusty Aus, MD  Admit date: 12/23/2020 Discharge date: 01/01/2021  Time spent: 35 minutes  Recommendations for Outpatient Follow-up:  Cardiology f/u if comfort care not pursued     Discharge Diagnoses:  Principal Problem:   Acute on chronic combined systolic (congestive) and diastolic (congestive) heart failure (HCC) Active Problems:   Chronic atrial fibrillation (HCC)   BPH (benign prostatic hyperplasia)   CKD (chronic kidney disease), stage IIIb   MDS (myelodysplastic syndrome), low grade (HCC)   Recurrent right pleural effusion   DNR (do not resuscitate)/DNI(Do Not Intubate)   Acute combined systolic (congestive) and diastolic (congestive) heart failure (HCC)   CHF (congestive heart failure) ([place])   Protein-calorie malnutrition, severe   Discharge Condition: stable  Diet recommendation: heart healthy  Filed Weights   12/30/20 0349 12/31/20 0407 01/01/21 0500  Weight: 79.4 kg 79.4 kg 77.2 kg    History of present illness:  87 year old white male with a history of combined systolic diastolic heart failure with an EF of 25%, hypertension, CKD stage IIIb, myelodysplastic syndrome, recurrent right pleural effusion, chronic atrial fibrillation not on anticoagulation due to history of prior GI bleed, BPH who presents to the ER today with worsening shortness of breath.  Daughter states the patient has been short of breath for last several weeks.  Patient recently discharged at the end of July for about of heart failure.  Patient recently had a packed red blood cell transfusion in hematology clinic due to a low hemoglobin.  This was 3 days ago.  Patient's had worsening shortness of breath since then.  He was seen in the cardiology office yesterday and had a dose of IV Lasix.  Patient states that he urinated about 1 to 2 L overnight however patient still symptomatically short of breath.   Hospital Course:  Presented  with CHF exacerbation. End-stage heart failure with EF of less than 20%. New o2 requirement that is stable. History of ckd3 now appears to be progression to ckd4, concern for cardiorenal. Was diuresed here and started on O2. Palliative consulted, plan is forgo further evaluation, instead d/c to home with home hospice, daughter is considering transition to comfort measures. Cardiology consulted here. In terms of home meds, coreg held secondary to hypotension, as was midodrine given advanced heart failure. If patient and family elect to continue treatment, they were advised close f/u with cardiology as outpatient.  Procedures: none   Consultations: cardiology  Discharge Exam: Vitals:   01/01/21 0726 01/01/21 1106  BP: (!) 100/48 (!) 101/58  Pulse: 76 74  Resp: 18 18  Temp: 98.1 F (36.7 C) 97.8 F (36.6 C)  SpO2: 98% 98%    General exam: Appears comfortable Respiratory system: decreased breath sounds b/l, scattered exp wheeze Cardiovascular system: S1/S2+. No rubs or clicks   Gastrointestinal system: Abd is soft, NT, ND & hypoactive bowel sounds  Central nervous system: Alert and oriented. Moves all extremities  Psychiatry: calm  Discharge Instructions   Discharge Instructions     Diet - low sodium heart healthy   Complete by: As directed    Increase activity slowly   Complete by: As directed       Allergies as of 01/01/2021       Reactions   Levaquin [levofloxacin] Other (See Comments)   [name], her daughter, said Levaquin should be avoided because when patient had Levaquin a few years ago, he had "a bad reaction".  She said he could  not walk and could not lift up his feet.   Escitalopram Other (See Comments)   Reaction:  Makes him feel faint, like he was going to have a heart attack.   5ht3 Receptor Antagonists Other (See Comments)   Diltiazem Rash   Diphenhydramine Hcl Other (See Comments)   Reaction:  Rash and fever a long time ago, but has had it since with no  problem.   Maxidex [dexamethasone] Other (See Comments)   Reaction:  Unknown    Prednisone Other (See Comments)   Pt states that this medication made him feel crazy.     Serotonin Other (See Comments)   Tried 2 different types, lexapro and another one.  Reaction:  Made him feel like he was having a heart attack.    Vytorin [ezetimibe-simvastatin] Other (See Comments)   Reaction:  Unknown    Zocor [simvastatin] Other (See Comments)   Reaction:  Unknown         Medication List     STOP taking these medications    carvedilol 3.125 MG tablet Commonly known as: COREG   midodrine 10 MG tablet Commonly known as: PROAMATINE       TAKE these medications    acetaminophen 500 MG tablet Commonly known as: TYLENOL Take 500 mg by mouth 2 (two) times daily as needed for mild pain or moderate pain.   atorvastatin 40 MG tablet Commonly known as: LIPITOR Take 1 tablet (40 mg total) by mouth daily with supper.   digoxin 0.125 MG tablet Commonly known as: LANOXIN Take 0.0625 mg by mouth daily.   famotidine 20 MG tablet Commonly known as: PEPCID Take 20 mg by mouth at bedtime.   ferrous sulfate 325 (65 FE) MG tablet Take 1 tablet (325 mg total) by mouth every Monday, Wednesday, and Friday. With supper   multivitamin tablet Take 1 tablet by mouth daily.   pantoprazole 40 MG tablet Commonly known as: PROTONIX Take 1 tablet (40 mg total) by mouth 2 (two) times daily.   polyethylene glycol 17 g packet Commonly known as: MIRALAX / GLYCOLAX Take 17 g by mouth daily as needed for mild constipation. What changed: Another medication with the same name was added. Make sure you understand how and when to take each.   polyethylene glycol 17 g packet Commonly known as: MiraLax Take 17 g by mouth daily. What changed: You were already taking a medication with the same name, and this prescription was added. Make sure you understand how and when to take each.   ranolazine 1000 MG SR  tablet Commonly known as: RANEXA Take 500 mg by mouth 2 (two) times daily.   sucralfate 1 g tablet Commonly known as: CARAFATE Take 1 g by mouth 2 (two) times daily.   tamsulosin 0.4 MG Caps capsule Commonly known as: FLOMAX Take 0.4 mg by mouth daily after supper.   torsemide 20 MG tablet Commonly known as: DEMADEX Take 1 tablet (20 mg total) by mouth daily.   vitamin B-12 500 MCG tablet Commonly known as: CYANOCOBALAMIN Take 500 mcg by mouth daily.   VITAMIN C PO Take 1 tablet by mouth daily with breakfast.       Allergies  Allergen Reactions   Levaquin [Levofloxacin] Other (See Comments)    [name], her daughter, said Levaquin should be avoided because when patient had Levaquin a few years ago, he had "a bad reaction".  She said he could not walk and could not lift up his feet.   Escitalopram Other (  See Comments)    Reaction:  Makes him feel faint, like he was going to have a heart attack.   5ht3 Receptor Antagonists Other (See Comments)   Diltiazem Rash   Diphenhydramine Hcl Other (See Comments)    Reaction:  Rash and fever a long time ago, but has had it since with no problem.   Maxidex [Dexamethasone] Other (See Comments)    Reaction:  Unknown    Prednisone Other (See Comments)    Pt states that this medication made him feel crazy.     Serotonin Other (See Comments)    Tried 2 different types, lexapro and another one.  Reaction:  Made him feel like he was having a heart attack.    Vytorin [Ezetimibe-Simvastatin] Other (See Comments)    Reaction:  Unknown    Zocor [Simvastatin] Other (See Comments)    Reaction:  Unknown     Follow-up Information     Fath, Javier Docker, MD Follow up.   Specialty: Cardiology Contact information: [place] [address] [phone]                  The results of significant diagnostics from this hospitalization (including imaging, microbiology, ancillary and laboratory) are listed below for  reference.    Significant Diagnostic Studies: CT ABDOMEN PELVIS WO CONTRAST  Result Date: 12/09/2020 CLINICAL DATA:  Abdominal pain, acute (Ped 0-18y) EXAM: CT ABDOMEN AND PELVIS WITHOUT CONTRAST TECHNIQUE: Multidetector CT imaging of the abdomen and pelvis was performed following the standard protocol without IV contrast. COMPARISON:  12/05/2020, MRI June 2017 FINDINGS: Lower chest: Decreased but persistent bilateral pleural effusions, right greater than left. Adjacent bibasilar atelectasis. Unchanged cardiomegaly. Hepatobiliary: No focal liver abnormality is seen. Prior cholecystectomy. No biliary dilation. Pancreas: Mildly atrophic. No pancreatic ductal dilatation or surrounding inflammatory changes. Spleen: Normal in size without focal abnormality. Adrenals/Urinary Tract: Adrenal glands are unremarkable. Unchanged nonobstructive 2-3 mm left upper pole renal stone. The previously seen punctate right renal stone is not well seen likely due to slice selection. Unchanged bilateral renal cysts which are incompletely evaluated without intravenous contrast. The largest again measures 2.8 cm in the lateral upper pole of the left kidney. There is no hydronephrosis. The bladder is unremarkable. Stomach/Bowel: The stomach is within normal limits. There is no evidence of bowel obstruction. Postsurgical changes of right hemicolectomy with ileocolonic anastomosis. Moderate colonic stool burden. Vascular/Lymphatic: Aortoiliac atherosclerotic calcifications. No AAA. Reproductive: Unremarkable. Other: No abdominopelvic ascites.  No hernia. Musculoskeletal: Multilevel degenerative changes of the spine, moderate to severe in the lumbar spine. Unchanged grade 1 anterolisthesis at L4-L5. There are bilateral hip degenerative changes with unchanged mineralization. There is a lucent lesion within the left femoral neck compatible with a benign intraosseous lipoma, as seen on prior MRI in June 2017. IMPRESSION: No acute  abdominopelvic abnormality. Unchanged nonobstructive 2-3 mm left upper pole renal stone. Decreased but persistent bilateral pleural effusions, right greater than left with adjacent bibasilar atelectasis. Electronically Signed   By: Maurine Simmering   On: 12/09/2020 12:49   DG Chest 2 View  Result Date: 12/23/2020 CLINICAL DATA:  Shortness of breath. EXAM: CHEST - 2 VIEW COMPARISON:  12/09/2020 FINDINGS: Sequelae of CABG are again identified. The cardiac silhouette remains mildly enlarged. There are increased interstitial densities bilaterally, greatest in the lung bases. Hazy airspace opacities are also present in the lung bases, and there are small bilateral pleural effusions. No pneumothorax or acute osseous abnormality is seen. IMPRESSION: Basilar predominant bilateral interstitial and  hazy airspace opacities with small pleural effusions compatible with edema. Electronically Signed   By: Logan Bores M.D.   On: 12/23/2020 10:19   MR BRAIN WO CONTRAST  Result Date: 12/09/2020 CLINICAL DATA:  Initial evaluation for neuro deficit, stroke suspected. EXAM: MRI HEAD WITHOUT CONTRAST TECHNIQUE: Multiplanar, multiecho pulse sequences of the brain and surrounding structures were obtained without intravenous contrast. COMPARISON:  Prior MRI from 11/22/2020. FINDINGS: Brain: Moderately advanced age-related cerebral atrophy with chronic microvascular ischemic disease. Remote lacunar infarct noted at the right posterior lentiform nucleus. Small remote left temporal occipital infarct with associated chronic hemosiderin staining. Additional small remote right occipital cortical infarct. Innumerable scattered chronic micro hemorrhages noted involving the posterior cerebral hemispheres and cerebellum, nonspecific, but favored to be related to chronic poorly controlled hypertension. Overall, appearance is stable from prior. No abnormal foci of restricted diffusion to suggest acute or subacute ischemia. Gray-white matter  differentiation otherwise maintained. No acute intracranial hemorrhage. No mass lesion, midline shift or mass effect. No hydrocephalus or extra-axial fluid collection. Pituitary gland suprasellar region normal. Vascular: Major intracranial vascular flow voids are maintained. Skull and upper cervical spine: Craniocervical junction within normal limits. Degenerative spondylosis at C2-3 without high-grade spinal stenosis noted. Diffusely decreased T1 signal intensity seen within the visualized bone marrow, nonspecific, but most commonly related to anemia, smoking, or obesity. No focal marrow replacing lesions. No scalp soft tissue abnormality. Sinuses/Orbits: Prior bilateral ocular lens replacement. Globes and orbital soft tissues demonstrate no acute finding. Mild scattered mucosal thickening noted within the ethmoidal air cells and maxillary sinuses. Trace left mastoid effusion noted, of doubtful significance. Other: None. IMPRESSION: 1. No acute intracranial abnormality. 2. Remote left temporoccipital and right occipital cortical infarcts. 3. Underlying moderately advanced cerebral atrophy with chronic microvascular ischemic disease. 4. Innumerable scattered chronic micro hemorrhages involving the posterior cerebral hemispheres and cerebellum, nonspecific, but favored to be related to chronic poorly controlled hypertension. Electronically Signed   By: Jeannine Boga M.D.   On: 12/09/2020 23:35   [place] CHEST (PLEURAL EFFUSION)  Result Date: 12/07/2020 CLINICAL DATA:  Interventional radiology consulted for left thoracentesis. EXAM: CHEST ULTRASOUND COMPARISON:  None. FINDINGS: No significant left pleural effusion. IMPRESSION: No significant left pleural effusion.  No thoracentesis performed. Electronically Signed   By: Miachel Roux M.D.   On: 12/07/2020 07:23   DG Chest Port 1 View  Result Date: 12/30/2020 CLINICAL DATA:  Heart failure. EXAM: PORTABLE CHEST 1 VIEW COMPARISON:  Chest radiograph dated  12/26/2020. FINDINGS: There is cardiomegaly with vascular congestion and edema. Small bilateral pleural effusions, right greater left with right lung base atelectasis or infiltrate. No pneumothorax. Median sternotomy wires and CABG vascular clips. No acute osseous pathology. IMPRESSION: Cardiomegaly with findings of CHF. Small bilateral pleural effusions, right greater than left. Electronically Signed   By: Anner Crete M.D.   On: 12/30/2020 20:05   DG Chest Port 1 View  Result Date: 12/26/2020 CLINICAL DATA:  Shortness of breath. EXAM: PORTABLE CHEST 1 VIEW COMPARISON:  December 23, 2020. FINDINGS: Stable cardiomegaly. Status post coronary artery bypass graft. No pneumothorax is noted. Interval development of right midlung subsegmental atelectasis or infiltrate. Stable bibasilar atelectasis is noted with probable small right pleural effusion. Bony thorax is unremarkable. IMPRESSION: Stable bibasilar atelectasis with probable small right pleural effusion. Interval development of right midlung subsegmental atelectasis or infiltrate. Electronically Signed   By: Marijo Conception M.D.   On: 12/26/2020 08:28   DG Chest Port 1 View  Result Date: 12/09/2020 CLINICAL DATA:  Weakness and dizziness. EXAM: PORTABLE CHEST 1 VIEW COMPARISON:  December 05, 2020 FINDINGS: Multiple sternal wires and vascular clips are seen. Stable, diffusely increased interstitial lung markings are noted. There is no evidence of a pleural effusion or pneumothorax. The cardiac silhouette is enlarged and unchanged in size. A chronic sixth left rib fracture is seen. Degenerative changes seen throughout the thoracic spine. IMPRESSION: Stable exam without significant interval change when compared to the prior chest plain film, dated December 05, 2020. Electronically Signed   By: Virgina Norfolk M.D.   On: 12/09/2020 03:41   DG Chest Port 1 View  Result Date: 12/05/2020 CLINICAL DATA:  Status post right thoracentesis. EXAM: PORTABLE CHEST 1 VIEW  COMPARISON:  December 02, 2020. FINDINGS: Stable cardiomegaly. Sternotomy wires are noted. No definite pneumothorax is noted status post thoracentesis. Pleural effusion appears to be significantly smaller. IMPRESSION: No definite pneumothorax status post right thoracentesis. Electronically Signed   By: Marijo Conception M.D.   On: 12/05/2020 11:46   ECHOCARDIOGRAM COMPLETE  Result Date: 12/24/2020    ECHOCARDIOGRAM REPORT   Patient Name:   Andres Gutierrez Date of Exam: 12/24/2020 Medical Rec #:  [id number]    Height:       76.0 in Accession #:    [id number]   Weight:       169.3 lb Date of Birth:  Jan 17, 1931    BSA:          2.064 m Patient Age:    60 years     BP:           97/53 mmHg Patient Gender: M            HR:           85 bpm. Exam Location:  [facility] Procedure: 2D Echo and Intracardiac Opacification Agent Indications:     Systolic CHF  History:         Patient has prior history of Echocardiogram examinations. CHF                  and Cardiomegaly, Previous Myocardial Infarction, Prior CABG;                  Risk Factors:Hypertension.  Sonographer:     L Thornton-Maynard Referring Phys:  [id number] ERIC CHEN Diagnosing Phys: Yolonda Kida MD IMPRESSIONS  1. Left ventricular ejection fraction, by estimation, is <20%. The left ventricle has severely decreased function. The left ventricle demonstrates global hypokinesis. The left ventricular internal cavity size was severely dilated. Left ventricular diastolic parameters were normal.  2. Right ventricular systolic function is moderately reduced. The right ventricular size is moderately enlarged. There is normal pulmonary artery systolic pressure.  3. Left atrial size was moderately dilated.  4. Right atrial size was moderately dilated.  5. The mitral valve is grossly normal. Mild mitral valve regurgitation.  6. The aortic valve is calcified. Aortic valve regurgitation is mild. Moderate to severe aortic valve stenosis. FINDINGS  Left Ventricle: Left ventricular ejection  fraction, by estimation, is <20%. The left ventricle has severely decreased function. The left ventricle demonstrates global hypokinesis. Definity contrast agent was given IV to delineate the left ventricular endocardial borders. The left ventricular internal cavity size was severely dilated. There is no left ventricular hypertrophy. Abnormal (paradoxical) septal motion, consistent with left bundle branch block. Left ventricular diastolic parameters were normal. Right Ventricle: The right ventricular size is moderately enlarged. No increase in right ventricular wall thickness. Right ventricular systolic function is  moderately reduced. There is normal pulmonary artery systolic pressure. The tricuspid regurgitant velocity is 2.61 m/s, and with an assumed right atrial pressure of 3 mmHg, the estimated right ventricular systolic pressure is 93.2 mmHg. Left Atrium: Left atrial size was moderately dilated. Right Atrium: Right atrial size was moderately dilated. Pericardium: Trivial pericardial effusion is present. Mitral Valve: The mitral valve is grossly normal. Mild mitral valve regurgitation. MV peak gradient, 9.9 mmHg. The mean mitral valve gradient is 5.0 mmHg. Tricuspid Valve: The tricuspid valve is grossly normal. Tricuspid valve regurgitation is mild. Aortic Valve: The aortic valve is calcified. Aortic valve regurgitation is mild. Moderate to severe aortic stenosis is present. Aortic valve mean gradient measures 11.5 mmHg. Aortic valve peak gradient measures 18.7 mmHg. Aortic valve area, by VTI measures 1.36 cm. Pulmonic Valve: The pulmonic valve was grossly normal. Pulmonic valve regurgitation is not visualized. Aorta: The ascending aorta was not well visualized. IAS/Shunts: No atrial level shunt detected by color flow Doppler. Additional Comments: There is pleural effusion in both left and right lateral regions.  LEFT VENTRICLE PLAX 2D LVIDd:         6.92 cm  Diastology LVIDs:         5.92 cm  LV e' medial:     5.51 cm/s LV PW:         1.08 cm  LV E/e' medial:  22.5 LV IVS:        0.91 cm  LV e' lateral:   7.14 cm/s LVOT diam:     2.20 cm  LV E/e' lateral: 17.4 LV SV:         52 LV SV Index:   25 LVOT Area:     3.80 cm  RIGHT VENTRICLE RV S prime:     7.70 cm/s TAPSE (M-mode): 1.1 cm LEFT ATRIUM              Index LA diam:        5.10 cm  2.47 cm/m LA Vol (A2C):   126.0 ml 61.05 ml/m LA Vol (A4C):   108.0 ml 52.33 ml/m LA Biplane Vol: 120.0 ml 58.15 ml/m  AORTIC VALVE                    PULMONIC VALVE AV Area (Vmax):    1.28 cm     PV Vmax:       1.10 m/s AV Area (Vmean):   1.36 cm     PV Peak grad:  4.8 mmHg AV Area (VTI):     1.36 cm AV Vmax:           216.50 cm/s AV Vmean:          161.000 cm/s AV VTI:            0.386 m AV Peak Grad:      18.7 mmHg AV Mean Grad:      11.5 mmHg LVOT Vmax:         73.10 cm/s LVOT Vmean:        57.700 cm/s LVOT VTI:          0.138 m LVOT/AV VTI ratio: 0.36  AORTA Ao Root diam: 3.30 cm Ao Asc diam:  4.20 cm MITRAL VALVE                TRICUSPID VALVE MV Area (PHT): 4.31 cm     TR Peak grad:   27.2 mmHg MV Area VTI:   1.44 cm     TR  Vmax:        261.00 cm/s MV Peak grad:  9.9 mmHg MV Mean grad:  5.0 mmHg     SHUNTS MV Vmax:       1.57 m/s     Systemic VTI:  0.14 m MV Vmean:      108.0 cm/s   Systemic Diam: 2.20 cm MV Decel Time: 176 msec MV E velocity: 124.00 cm/s Yolonda Kida MD Electronically signed by Yolonda Kida MD Signature Date/Time: 12/24/2020/9:26:05 PM    Final    CT RENAL STONE STUDY  Result Date: 12/05/2020 CLINICAL DATA:  Flank pain, kidney stone suspected EXAM: CT ABDOMEN AND PELVIS WITHOUT CONTRAST TECHNIQUE: Multidetector CT imaging of the abdomen and pelvis was performed following the standard protocol without IV contrast. COMPARISON:  10/03/2020 FINDINGS: Lower chest: There is a moderate to large right pleural effusion and small left pleural effusion. Pleural thickening and calcifications overlie the posterior medial right lung. Hepatobiliary: No  focal liver abnormality. Status post cholecystectomy. No bile duct dilatation. Pancreas: No pancreas inflammation, mass or main duct dilatation. Spleen: Normal appearance of the spleen. Adrenals/urinary tract: Normal appearance of the adrenal glands. 3 mm stone identified within the upper pole of the left kidney. 2 mm calcification noted within upper pole of right kidney. Bilateral kidney cysts are again noted and are incompletely characterized without IV contrast. The largest is in the lateral cortex of the upper pole of left kidney measuring 2.8 centimeters, image 98/5. No hydronephrosis identified bilaterally. No hydroureter or ureterolithiasis. Urinary bladder appears normal. Stomach/bowel: Stomach is normal. Status post right hemicolectomy with enterocolonic anastomosis. No bowel wall thickening, inflammation, or distension. Vascular/lymphatic: Aortic atherosclerosis. No aneurysm. Calcified lymph nodes identified within the upper abdomen compatible with prior granulomatous disease. No abdominopelvic adenopathy. Reproductive: Normal. Other: Small volume of free fluid noted within the abdomen and pelvis, increased from previous exam. No discrete fluid collections. No abdominal wall hernia. Musculoskeletal: 7 millimeters anterolisthesis L4 on L5. Multilevel degenerative disc disease is identified throughout the thoracic and lumbar spine. L4-5 and L5-S1 facet arthropathy. IMPRESSION: 1. Nonobstructing small bilateral renal calculi as described above. 2. Cardiac enlargement and bilateral pleural effusions concerning for congestive heart failure. 3. Small volume of free fluid within the abdomen and pelvis, increased from previous exam. 4. Marked thoracolumbar spondylosis. Electronically Signed   By: Kerby Moors M.D.   On: 12/05/2020 06:18   US THORACENTESIS ASP PLEURAL SPACE W/IMG GUIDE  Result Date: 12/05/2020 INDICATION: Patient history of congestive heart failure, AFib, myelodysplastic syndrome currently  admitted for acute exacerbation congestive heart failure and found to have bilateral pleural effusions. Request to IR for diagnostic and therapeutic thoracentesis. EXAM: ULTRASOUND GUIDED RIGHT THORACENTESIS MEDICATIONS: 7 mL% lidocaine COMPLICATIONS: None immediate. PROCEDURE: An ultrasound guided thoracentesis was thoroughly discussed with the patient and questions answered. The benefits, risks, alternatives and complications were also discussed. The patient understands and wishes to proceed with the procedure. Written consent was obtained. Ultrasound was performed to localize and mark an adequate pocket of fluid in the right chest. The area was then prepped and draped in the normal sterile fashion. 1% Lidocaine was used for local anesthesia. Under ultrasound guidance a 6 Fr Safe-T-Centesis catheter was introduced. Thoracentesis was performed. The catheter was removed and a dressing applied. FINDINGS: A total of approximately 2.0 L of clear yellow fluid was removed. Samples were sent to the laboratory as requested by the clinical team. IMPRESSION: Successful ultrasound guided right thoracentesis yielding 2.0 L of pleural fluid. Read  by Candiss Norse, PA-C Electronically Signed   By: Markus Daft M.D.   On: 12/05/2020 11:59    Microbiology: Recent Results (from the past 240 hour(s))  Resp Panel by RT-PCR (Flu A&B, Covid) Nasopharyngeal Swab     Status: None   Collection Time: 12/23/20 11:02 AM   Specimen: Nasopharyngeal Swab; Nasopharyngeal(NP) swabs in vial transport medium  Result Value Ref Range Status   SARS Coronavirus 2 by RT PCR NEGATIVE NEGATIVE Final    Comment: (NOTE) SARS-CoV-2 target nucleic acids are NOT DETECTED.  The SARS-CoV-2 RNA is generally detectable in upper respiratory specimens during the acute phase of infection. The lowest concentration of SARS-CoV-2 viral copies this assay can detect is 138 copies/mL. A negative result does not preclude SARS-Cov-2 infection and should not  be used as the sole basis for treatment or other patient management decisions. A negative result may occur with  improper specimen collection/handling, submission of specimen other than nasopharyngeal swab, presence of viral mutation(s) within the areas targeted by this assay, and inadequate number of viral copies(<138 copies/mL). A negative result must be combined with clinical observations, patient history, and epidemiological information. The expected result is Negative.  Fact Sheet for Patients:  EntrepreneurPulse.com.au  Fact Sheet for Healthcare Providers:  IncredibleEmployment.be  This test is no t yet approved or cleared by the [place] FDA and  has been authorized for detection and/or diagnosis of SARS-CoV-2 by FDA under an Emergency Use Authorization (EUA). This EUA will remain  in effect (meaning this test can be used) for the duration of the COVID-19 declaration under Section 564(b)(1) of the Act, 21 U.S.C.section 360bbb-3(b)(1), unless the authorization is terminated  or revoked sooner.       Influenza A by PCR NEGATIVE NEGATIVE Final   Influenza B by PCR NEGATIVE NEGATIVE Final    Comment: (NOTE) The Xpert Xpress SARS-CoV-2/FLU/RSV plus assay is intended as an aid in the diagnosis of influenza from Nasopharyngeal swab specimens and should not be used as a sole basis for treatment. Nasal washings and aspirates are unacceptable for Xpert Xpress SARS-CoV-2/FLU/RSV testing.  Fact Sheet for Patients: EntrepreneurPulse.com.au  Fact Sheet for Healthcare Providers: IncredibleEmployment.be  This test is not yet approved or cleared by the [place] FDA and has been authorized for detection and/or diagnosis of SARS-CoV-2 by FDA under an Emergency Use Authorization (EUA). This EUA will remain in effect (meaning this test can be used) for the duration of the COVID-19 declaration under Section  564(b)(1) of the Act, 21 U.S.C. section 360bbb-3(b)(1), unless the authorization is terminated or revoked.  Performed at [facility], [address] [place], [place] [address]      Labs: Basic Metabolic Panel: Recent Labs  Lab 12/26/20 0436 12/27/20 0425 12/28/20 0450 12/29/20 0339 12/30/20 0429 12/31/20 0426 01/01/21 0441  NA 135 132* 132* 130* 130* 129* 130*  K 3.9 4.6 4.3 4.5 5.2* 5.0 4.7  CL 97* 95* 94* 90* 92* 89* 90*  CO2 27 29 28 27 25 29 29   GLUCOSE 88 93 91 98 104* 105* 93  BUN 44* 48* 51* 55* 65* 70* 82*  CREATININE 2.19* 2.28* 2.17* 2.19* 2.54* 2.76* 2.70*  CALCIUM 9.0 9.0 8.9 9.0 8.9 9.0 9.0  MG 1.8 1.8 1.7  --   --   --   --    Liver Function Tests: No results for input(s): AST, ALT, ALKPHOS, BILITOT, PROT, ALBUMIN in the last 168 hours. No results for input(s): LIPASE, AMYLASE in the last 168 hours.  No results for input(s): AMMONIA in the last 168 hours. CBC: Recent Labs  Lab 12/28/20 0450 12/29/20 0339 12/30/20 0429 12/31/20 0426 01/01/21 0441  WBC 7.6 11.2* 17.1* 12.3* 10.0  HGB 8.8* 8.9* 8.4* 8.0* 7.8*  HCT 26.1* 26.6* 25.3* 24.8* 24.0*  MCV 99.2 97.8 101.2* 99.2 101.3*  PLT 156 146* 160 134* 127*   Cardiac Enzymes: No results for input(s): CKTOTAL, CKMB, CKMBINDEX, TROPONINI in the last 168 hours. BNP: BNP (last 3 results) Recent Labs    12/07/20 0833 12/09/20 0759 12/23/20 0925  BNP 1,645.8* 1,091.0* 1,557.2*    ProBNP (last 3 results) No results for input(s): PROBNP in the last 8760 hours.  CBG: No results for input(s): GLUCAP in the last 168 hours.     Signed:  Desma Maxim MD.  [facility] 01/01/2021, 11:16 AM

## 2021-01-03 ENCOUNTER — Inpatient Hospital Stay: Payer: PPO

## 2021-01-03 ENCOUNTER — Ambulatory Visit: Payer: PPO

## 2021-01-03 ENCOUNTER — Other Ambulatory Visit: Payer: PPO

## 2021-01-06 ENCOUNTER — Ambulatory Visit: Payer: PPO | Admitting: Family

## 2021-01-12 DEATH — deceased

## 2021-01-17 ENCOUNTER — Ambulatory Visit: Payer: PPO

## 2021-01-17 ENCOUNTER — Other Ambulatory Visit: Payer: PPO

## 2021-01-17 ENCOUNTER — Ambulatory Visit: Payer: PPO | Admitting: Internal Medicine

## 2021-10-10 IMAGING — CR DG CHEST 2V
1 series · 2 of 2 positions shown · non-contrast
Comparison: 07/18/2020

CLINICAL DATA: 89-year-old male with shortness of breath.

EXAM:
CHEST - 2 VIEW

[Series 1: dg chest 2 view · 0.14mm/px · 2 of 2 slices shown]
[im 1/2]
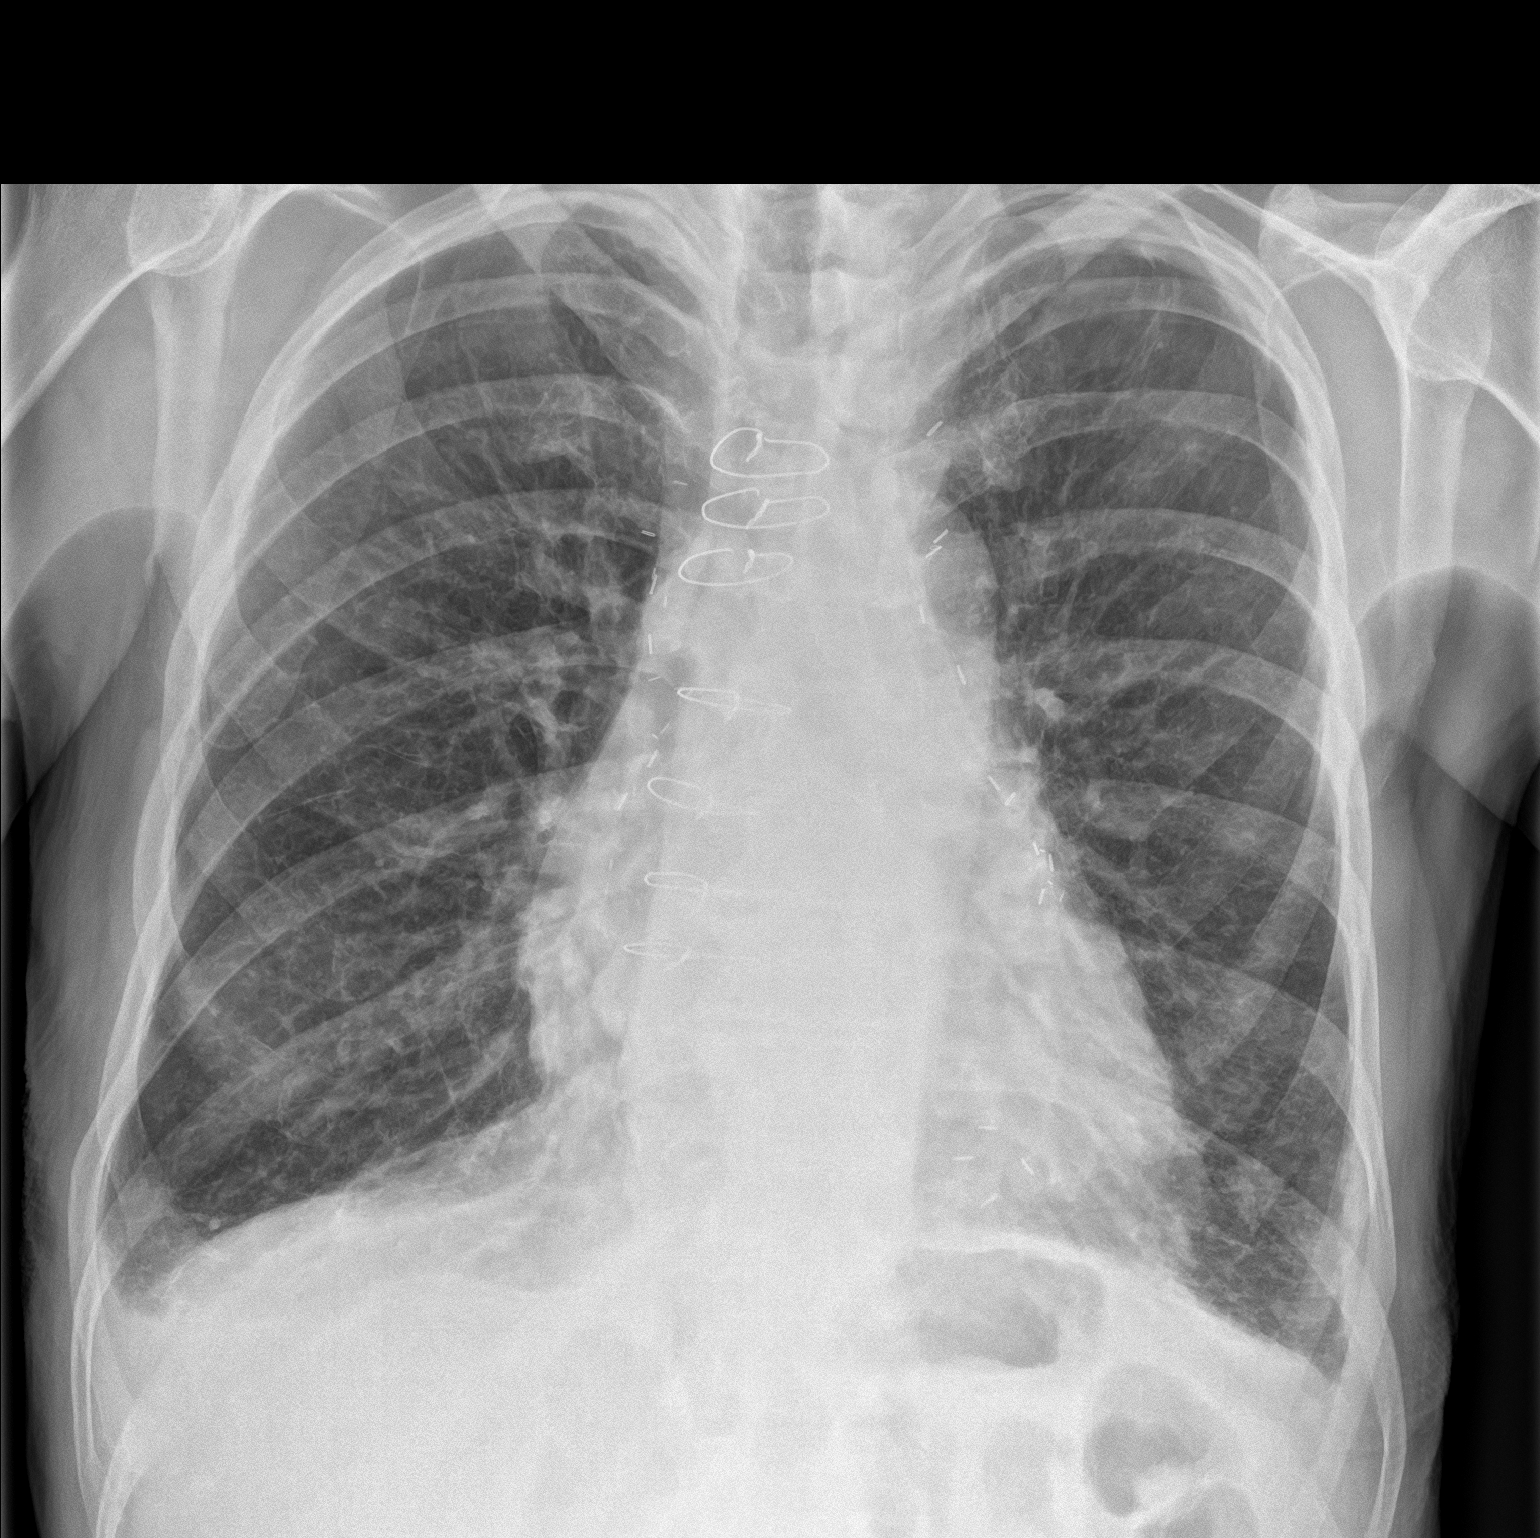
[im 2/2]
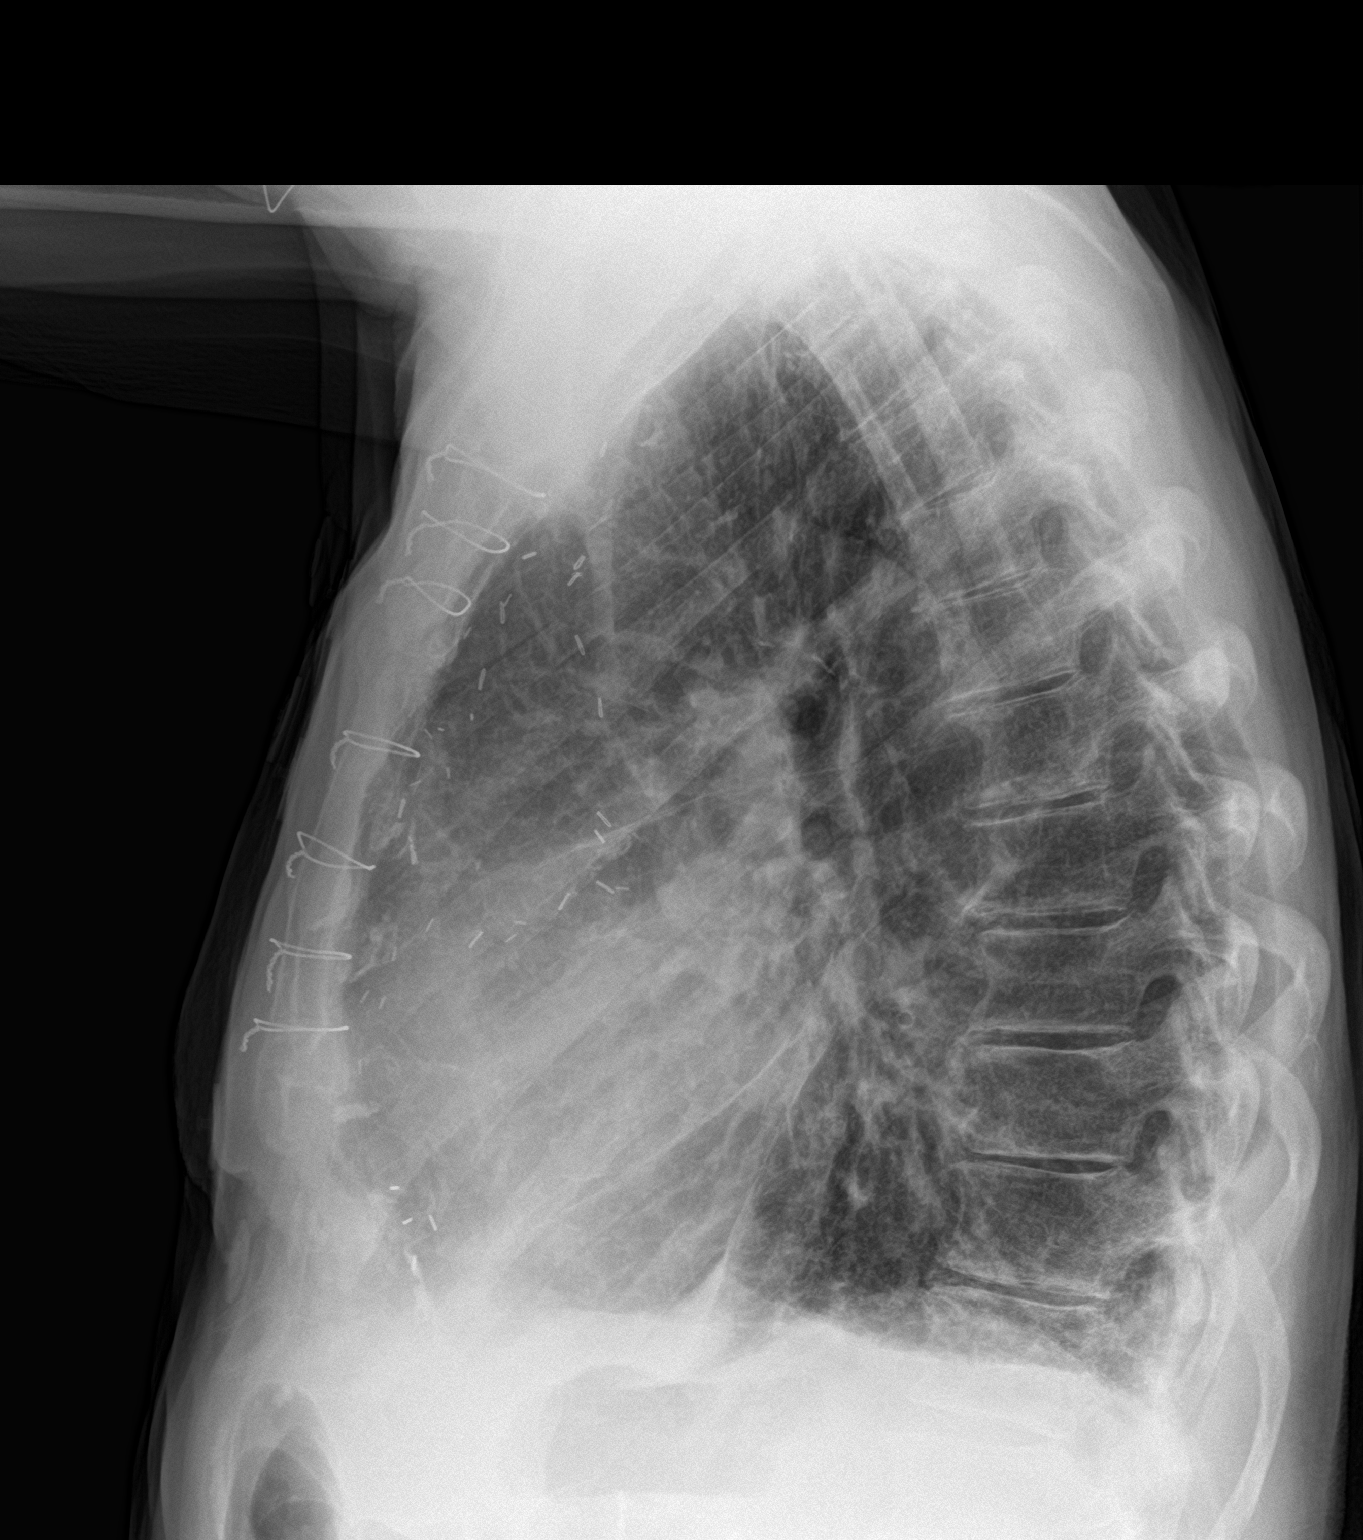

[2 of 2 positions shown; findings below may reference images not displayed]

FINDINGS: The mediastinal contours are within normal limits. No cardiomegaly.
Postsurgical changes after coronary artery bypass graft. Flattening
of the hemidiaphragms bilaterally, similar to comparison. Mild
blunting of the costophrenic angles bilaterally. Unchanged cavitary
lesion in the left apex. No focal consolidation, pleural effusion,
or pneumothorax. No acute osseous abnormality.
IMPRESSION: No acute cardiopulmonary process. Similar appearing emphysematous
changes.

## 2022-02-20 IMAGING — CR DG CHEST 2V
1 series · 2 of 2 positions shown · non-contrast
Comparison: 12/09/2020

CLINICAL DATA: Shortness of breath.

EXAM:
CHEST - 2 VIEW

[Series 1: dg chest 2 view · 0.14mm/px · 2 of 2 slices shown]
[im 1/2]
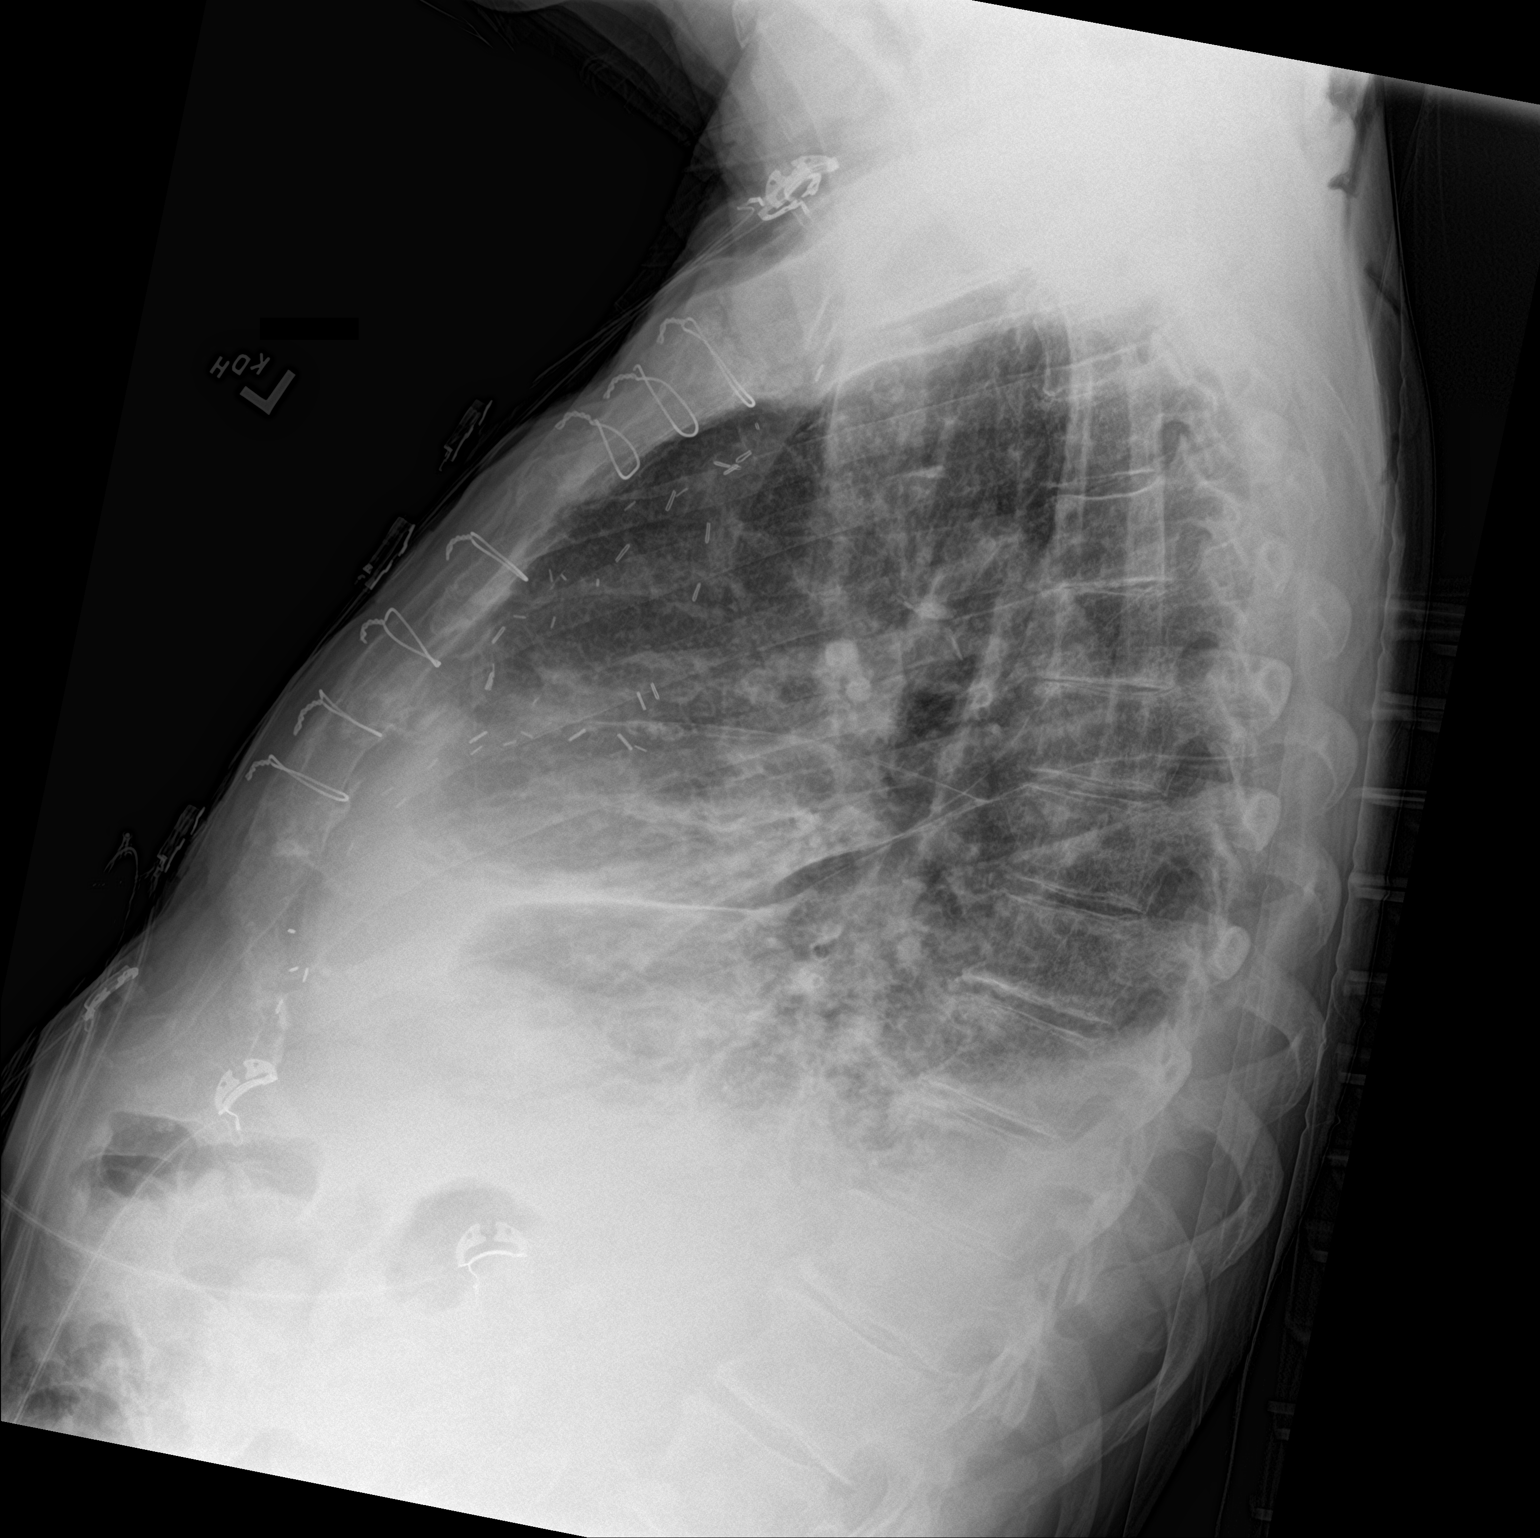
[im 2/2]
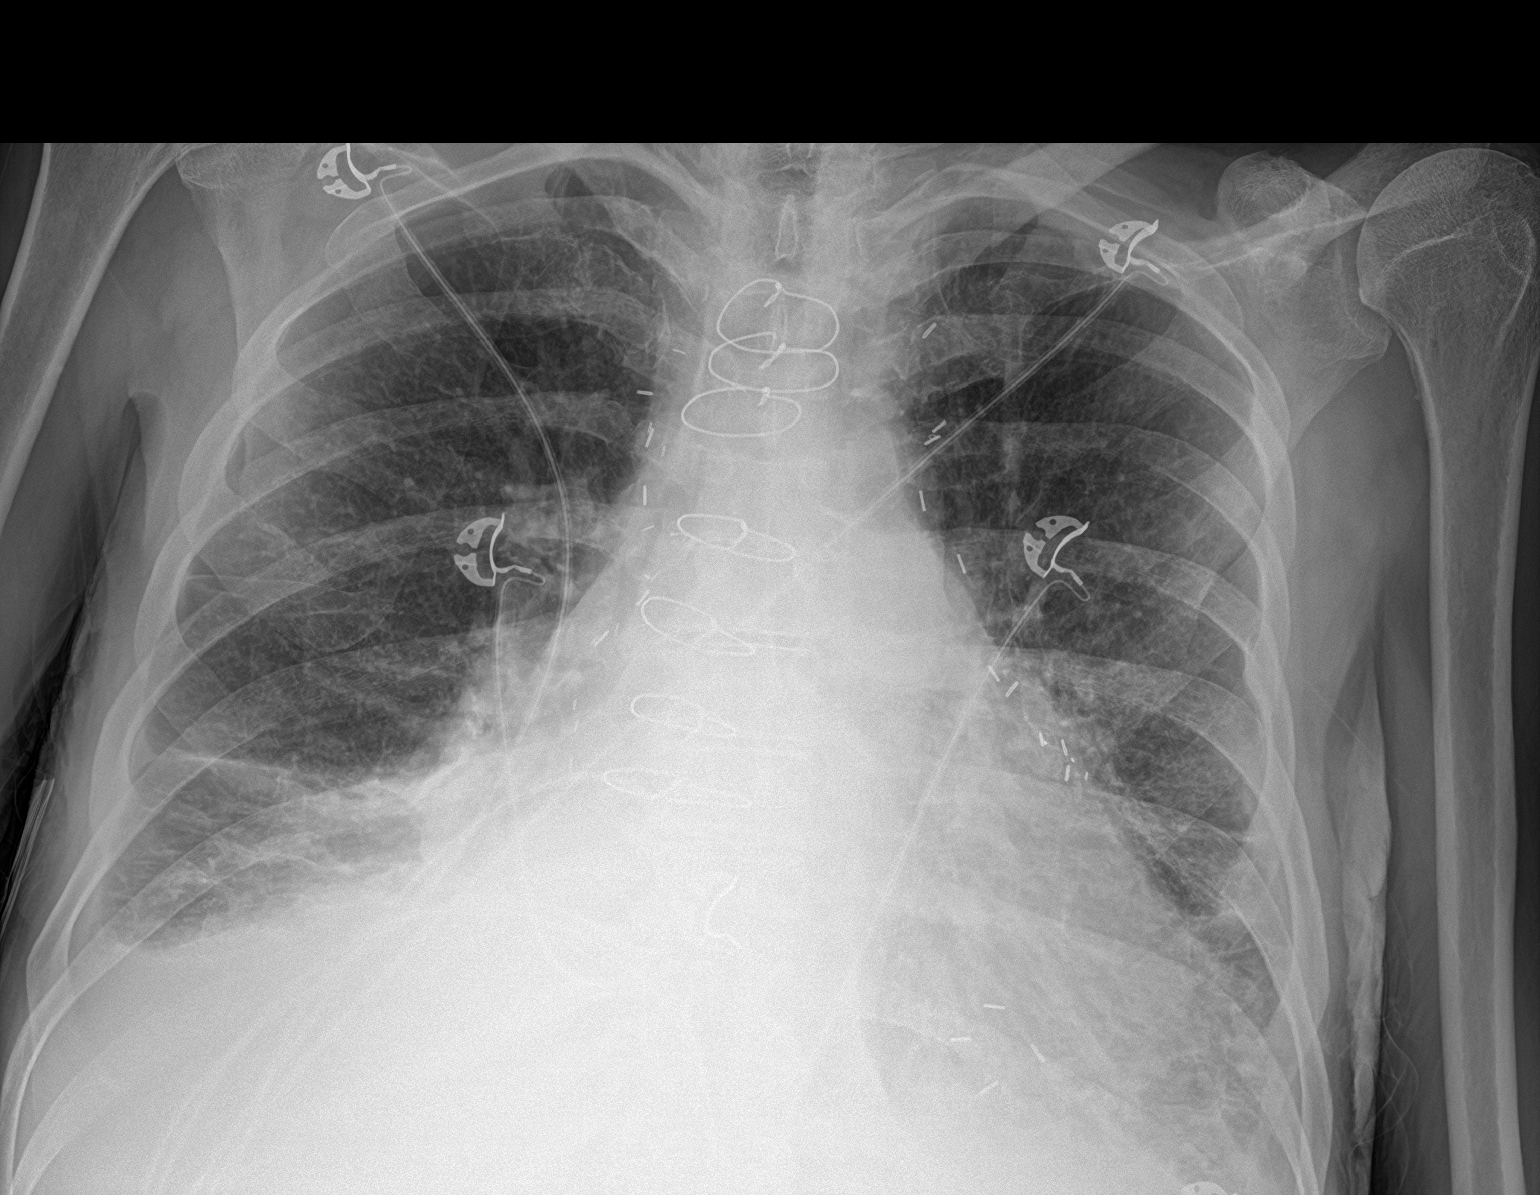

[2 of 2 positions shown; findings below may reference images not displayed]

FINDINGS: Sequelae of CABG are again identified. The cardiac silhouette
remains mildly enlarged. There are increased interstitial densities
bilaterally, greatest in the lung bases. Hazy airspace opacities are
also present in the lung bases, and there are small bilateral
pleural effusions. No pneumothorax or acute osseous abnormality is
seen.
IMPRESSION: Basilar predominant bilateral interstitial and hazy airspace
opacities with small pleural effusions compatible with edema.

## 2022-02-23 IMAGING — DX DG CHEST 1V PORT
1 series · 2 of 2 positions shown · non-contrast
Comparison: December 23, 2020.

CLINICAL DATA: Shortness of breath.

EXAM:
PORTABLE CHEST 1 VIEW

[Series 1: chest ap · 0.14mm/px · 2 of 2 slices shown]
[im 1/2]
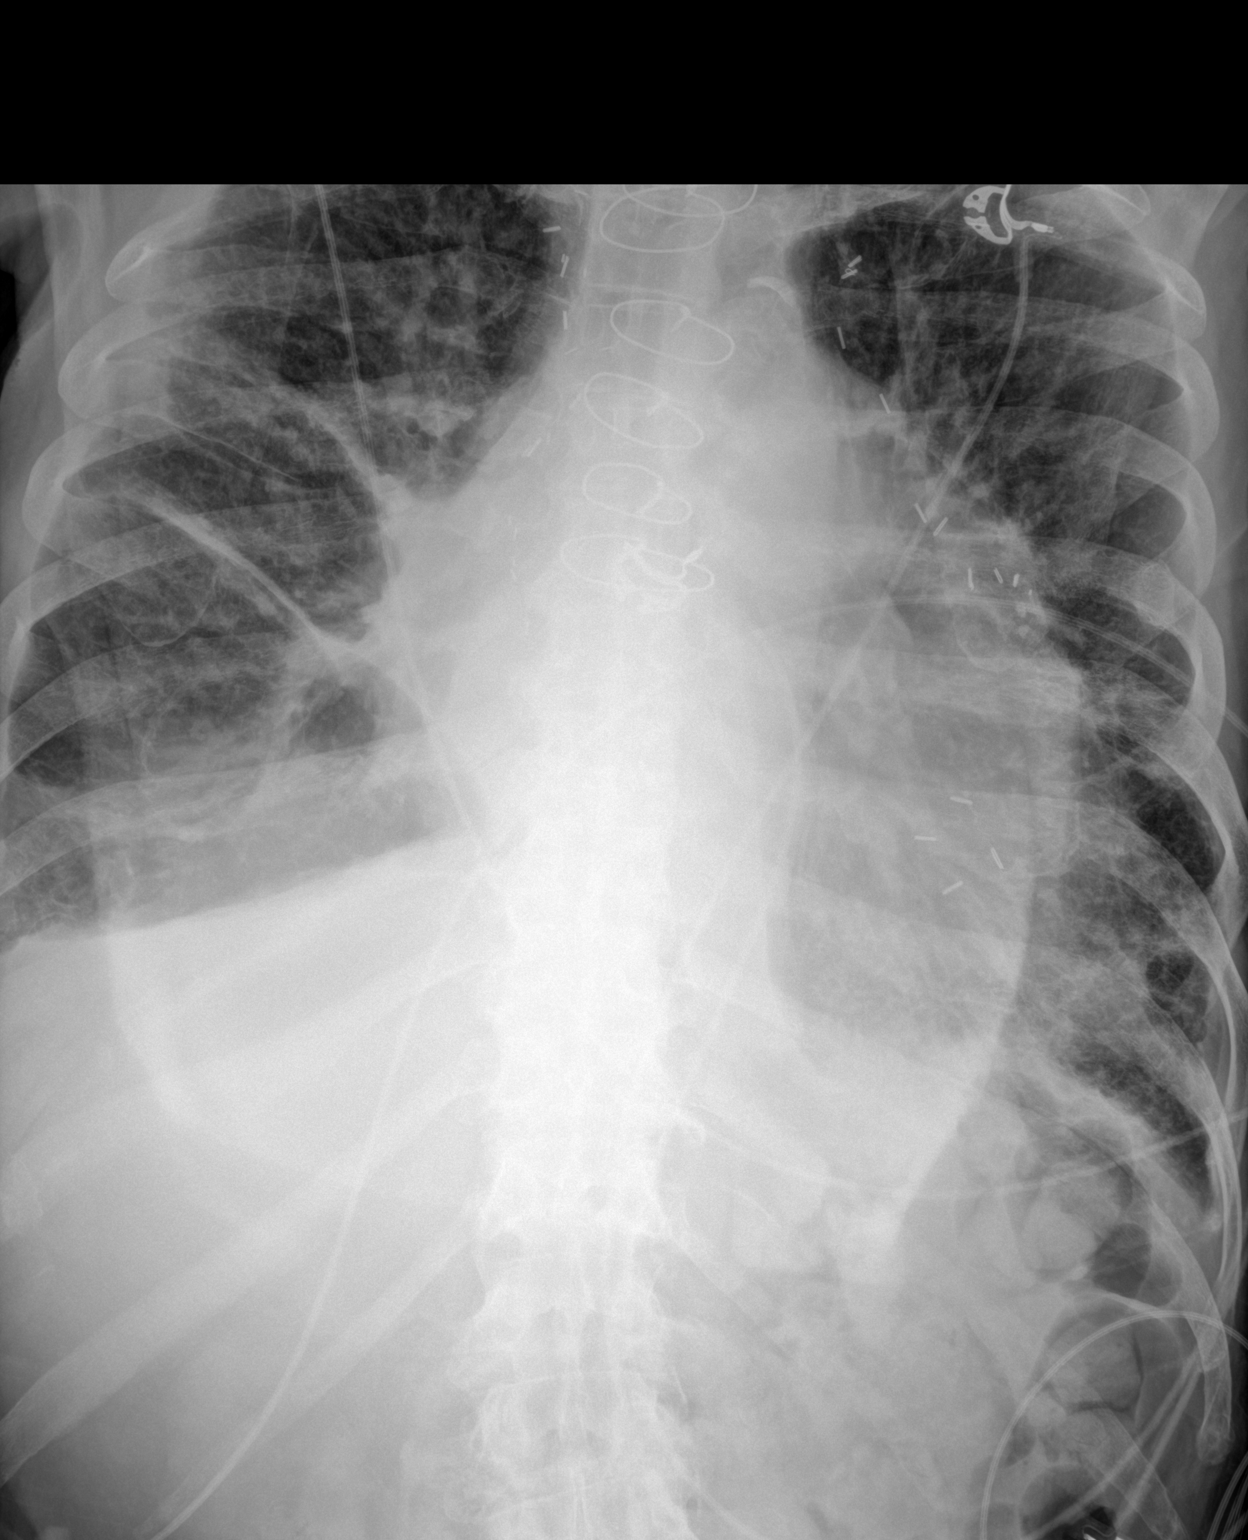
[im 2/2]
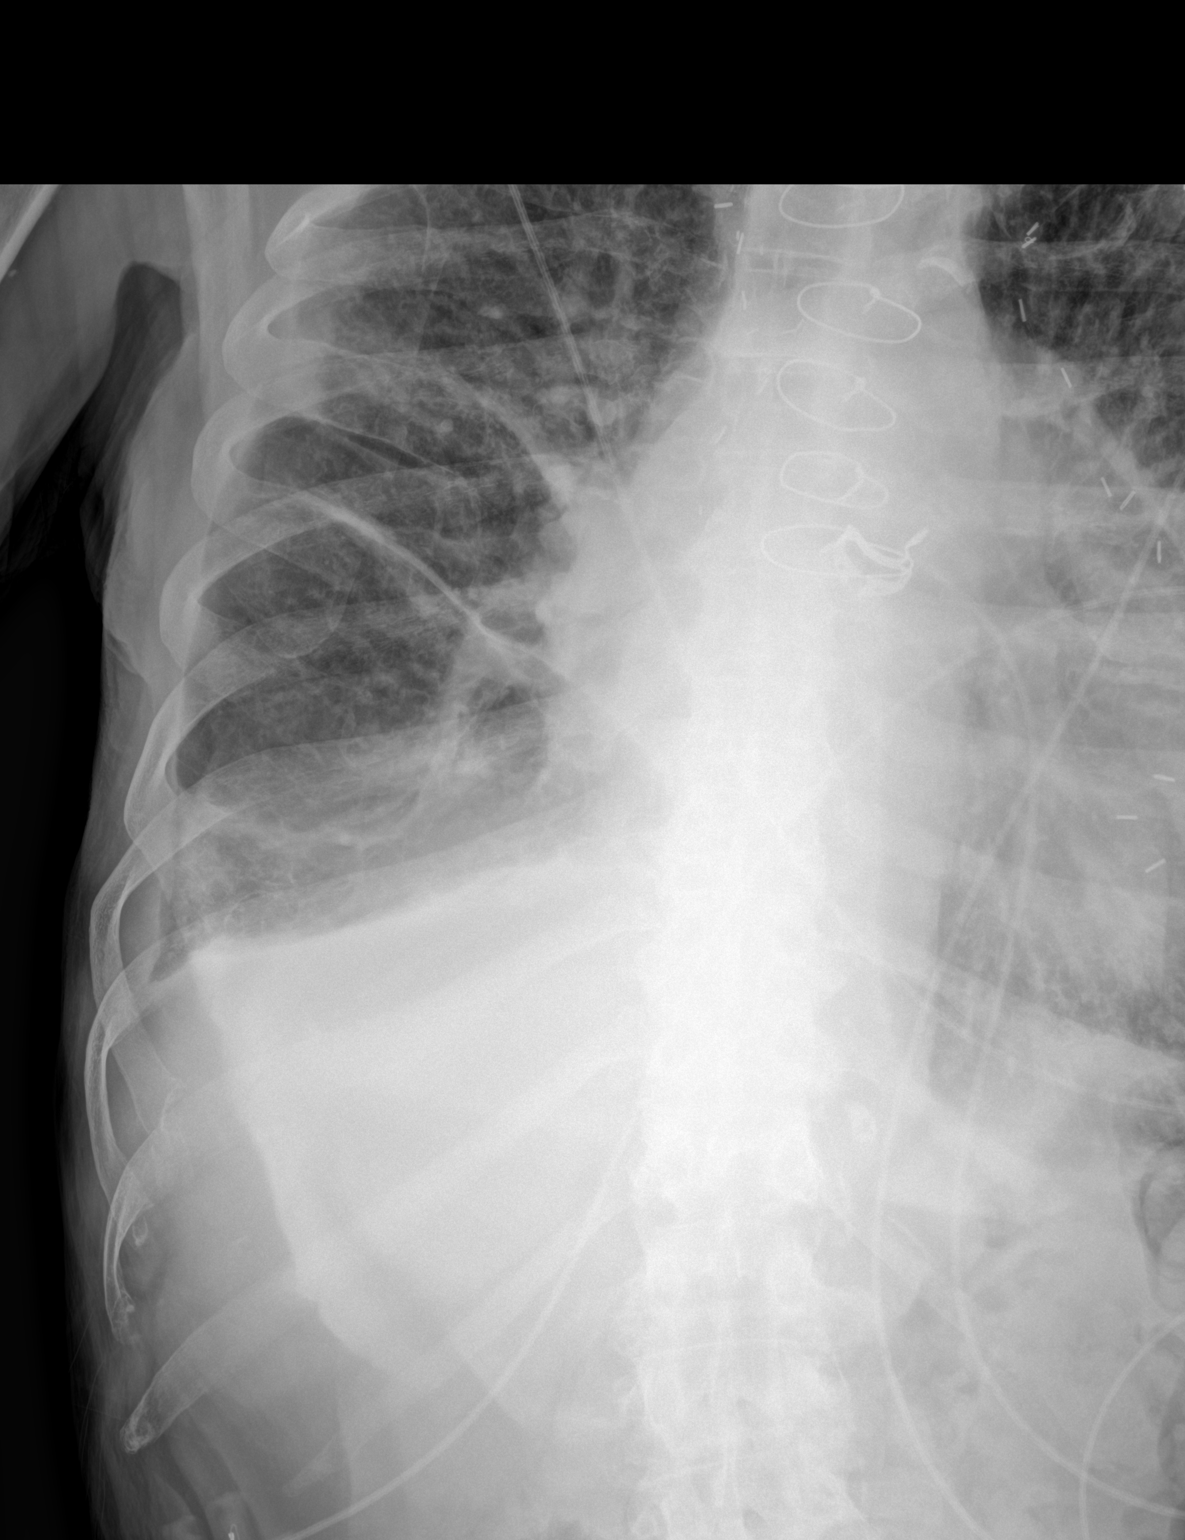

[2 of 2 positions shown; findings below may reference images not displayed]

FINDINGS: Stable cardiomegaly. Status post coronary artery bypass graft. No
pneumothorax is noted. Interval development of right midlung
subsegmental atelectasis or infiltrate. Stable bibasilar atelectasis
is noted with probable small right pleural effusion. Bony thorax is
unremarkable.
IMPRESSION: Stable bibasilar atelectasis with probable small right pleural
effusion. Interval development of right midlung subsegmental
atelectasis or infiltrate.
